# Patient Record
Sex: Male | Born: 1940 | Race: White | Hispanic: No | Smoking: Former smoker
Health system: Southern US, Community
[De-identification: ages and names within clinical notes are randomized; demographics above are authoritative.]

## PROBLEM LIST (undated history)

## (undated) DIAGNOSIS — M47812 Spondylosis without myelopathy or radiculopathy, cervical region: Secondary | ICD-10-CM

## (undated) DIAGNOSIS — C801 Malignant (primary) neoplasm, unspecified: Secondary | ICD-10-CM

## (undated) DIAGNOSIS — Z7901 Long term (current) use of anticoagulants: Secondary | ICD-10-CM

## (undated) DIAGNOSIS — E559 Vitamin D deficiency, unspecified: Secondary | ICD-10-CM

## (undated) DIAGNOSIS — E785 Hyperlipidemia, unspecified: Secondary | ICD-10-CM

## (undated) DIAGNOSIS — I639 Cerebral infarction, unspecified: Secondary | ICD-10-CM

## (undated) DIAGNOSIS — M1990 Unspecified osteoarthritis, unspecified site: Secondary | ICD-10-CM

## (undated) DIAGNOSIS — I10 Essential (primary) hypertension: Secondary | ICD-10-CM

## (undated) DIAGNOSIS — D500 Iron deficiency anemia secondary to blood loss (chronic): Secondary | ICD-10-CM

## (undated) HISTORY — DX: Essential (primary) hypertension: I10

## (undated) HISTORY — DX: Unspecified osteoarthritis, unspecified site: M19.90

## (undated) HISTORY — PX: COLONOSCOPY W/ POLYPECTOMY: SHX1380

## (undated) HISTORY — DX: Malignant (primary) neoplasm, unspecified: C80.1

## (undated) HISTORY — PX: TONSILLECTOMY AND ADENOIDECTOMY: SUR1326

## (undated) HISTORY — DX: Vitamin D deficiency, unspecified: E55.9

## (undated) HISTORY — DX: Long term (current) use of anticoagulants: Z79.01

## (undated) HISTORY — PX: ABCESS DRAINAGE: SHX399

## (undated) HISTORY — DX: Hyperlipidemia, unspecified: E78.5

## (undated) HISTORY — DX: Iron deficiency anemia secondary to blood loss (chronic): D50.0

---

## 1999-10-12 HISTORY — PX: CARDIOVASCULAR STRESS TEST: SHX262

## 1999-10-13 ENCOUNTER — Encounter: Admission: RE | Admit: 1999-10-13 | Discharge: 1999-10-13 | Payer: Self-pay | Admitting: Cardiology

## 1999-10-13 ENCOUNTER — Encounter: Payer: Self-pay | Admitting: Cardiology

## 2007-08-27 ENCOUNTER — Inpatient Hospital Stay (HOSPITAL_COMMUNITY): Admission: RE | Admit: 2007-08-27 | Discharge: 2007-08-29 | Payer: Self-pay | Admitting: Surgery

## 2009-10-13 ENCOUNTER — Encounter: Admission: RE | Admit: 2009-10-13 | Discharge: 2009-10-13 | Payer: Self-pay | Admitting: Cardiology

## 2010-06-16 ENCOUNTER — Ambulatory Visit: Payer: Self-pay | Admitting: Cardiology

## 2010-10-24 ENCOUNTER — Ambulatory Visit: Payer: Self-pay | Admitting: Cardiology

## 2011-02-28 NOTE — Discharge Summary (Signed)
NAME:  Jake Gross, Jake Gross             ACCOUNT NO.:  [id number]   MEDICAL RECORD NO.:  [id number]          PATIENT TYPE:  INP   LOCATION:  [id number]                         FACILITY:  [facility]   PHYSICIAN:  Earnstine Regal, MD      DATE OF BIRTH:  08-May-1941   DATE OF ADMISSION:  08/27/2007  DATE OF DISCHARGE:  08/29/2007                               DISCHARGE SUMMARY   REASON FOR ADMISSION:  Perirectal abscess.   HISTORY OF THE PRESENT ILLNESS:  The patient is a 70 year old white male  from [place], [place].  The patient presented to the office  with a 3-day history of perirectal pain.  The patient was seen and  evaluated by Dr. Fanny Skates and noted to have a perirectal abscess.  The patient was sent to [facility] where he was evaluated by  myself and prepared for the operating room.   HOSPITAL COURSE:  The patient was admitted and taken directly to the  operating room where he underwent examination under anesthesia with  incision, drainage and open packing of a very large left-sided  supralevator perirectal abscess.  Postoperatively he received  intravenous antibiotics for 48 hours.  He had local wound care.  The  patient required sliding scale insulin for control of his diabetes  mellitus.  The patient was prepared for discharge home on the second  postoperative day.   DISCHARGE/PLAN:  The patient will be discharged home today, August 29, 2007, in good condition, tolerating a modified carbohydrate diet and  ambulating independently.   DISCHARGE MEDICATIONS:  The discharge medications include Augmentin 875  mg twice a day for 7 days and Tylox as needed for pain.   WOUND CARE:  The patient's packing will be removed prior to discharge.  Local wound care will consist of tub soaks three times daily.  The  patient will also require a stool softener.   FOLLOW UP:  The patient will return to see me in 5 days for wound check  at [facility] surgery.   DISCHARGE DIAGNOSES:  1. Supralevator perirectal abscess.  2. Diabetes mellitus.   CONDITION AT DISCHARGE:  The patient's condition on discharge is  improved.      Earnstine Regal, MD  Electronically Signed     TMG/MEDQ  D:  08/29/2007  T:  08/30/2007  Job:  [id number]

## 2011-02-28 NOTE — Op Note (Signed)
NAME:  Jake Gross, Jake Gross             ACCOUNT NO.:  [id number]   MEDICAL RECORD NO.:  [id number]          PATIENT TYPE:  AMB   LOCATION:  DAY                          FACILITY:  [facility]   PHYSICIAN:  Earnstine Regal, MD      DATE OF BIRTH:  07/21/1941   DATE OF PROCEDURE:  08/27/2007  DATE OF DISCHARGE:                               OPERATIVE REPORT   PREOPERATIVE DIAGNOSIS:  Perirectal abscess.   POSTOPERATIVE DIAGNOSIS:  Supralevator perirectal abscess.   PROCEDURE:  Incision and drainage and open packing of supralevator  perirectal abscess.   SURGEON:  Earnstine Regal, MD, FACS   ANESTHESIA:  General per Dr. Osvaldo Shipper.   ESTIMATED BLOOD LOSS:  Minimal.   PREPARATION:  Betadine.   COMPLICATIONS:  None.   INDICATIONS:  The patient is a 70 year old white male presented to  [facility] today with 3-day history of left-sided rectal  pain.  The patient was seen and evaluated by Dr. Fanny Skates and  diagnosed with perirectal abscess.  He is referred to the operating room  at [facility] for drainage.   BODY OF REPORT:  Procedure is done in OR #3 at the [facility].  The patient is brought to the operating room, placed in  supine position on the operating room table.  Following administration  of general anesthesia the patient is placed in lithotomy and prepped and  draped in usual strict aseptic fashion.  After ascertaining that an  adequate level of anesthesia had been achieved, an incision is made at  the medial aspect of the left buttock just outside the anoderm with the  electrocautery.  2 cm incision is created.  Dissection is carried  through subcutaneous tissues with electrocautery for hemostasis.  Using  blunt dissection, the abscess cavity is entered digitally.  Loculations  were broken up with blunt digital dissection.  The abscess tracks along  the left side of the rectum.  It extends supralevator into a very large  abscess  cavity.  Purulent fluid is evacuated.  Aerobic and anaerobic  cultures were submitted to the laboratory.  Cavity is irrigated  copiously using an aseptic syringe with saline.  This was evacuated.  Finally a 2-inch vaginal pack saturated with Betadine is placed into the  cavity.  Dressings and ABD pad are placed on the perineum.  The patient  is taken out lithotomy and awakened from anesthesia.  The patient is  brought to the recovery room in stable condition.  The patient tolerated  the procedure well.     Earnstine Regal, MD  Electronically Signed    TMG/MEDQ  D:  08/27/2007  T:  08/28/2007  Job:  [id number]

## 2011-02-28 NOTE — H&P (Signed)
NAME:  Jake Gross, Jake Gross             ACCOUNT NO.:  [id number]   MEDICAL RECORD NO.:  [id number]          PATIENT TYPE:  INP   LOCATION:  1538                         FACILITY:  [facility]   PHYSICIAN:  Edsel Petrin. Dalbert Batman, M.D.DATE OF BIRTH:  Aug 17, 1941   DATE OF ADMISSION:  08/27/2007  DATE OF DISCHARGE:                              HISTORY & PHYSICAL   CHIEF COMPLAINT:  Left-sided rectal pain.   HISTORY OF PRESENT ILLNESS:  This is a 70 year old white male who  reports a 3 day history of constipation and progressive left-sided  rectal pain.  Has not had any fever or chills.  He has not had any  drainage.  He has never had a rectal problem before of any kind.  He saw  Dr. Laurence Spates and was referred to [place].   PAST HISTORY:  1. He does not have any history of prior rectal problems.  2. He has hypertension.  3. He has diabetes mellitus on Lantus insulin.   CURRENT MEDICATIONS:  1. Aspirin 81 mg a day.  2. Viagra p.r.n.  3. Hyzaar 100/2.5 mg daily.  4. Toprol XL 50 mg daily.  5. Lantus insulin 60 units b.i.d.  6. Januvia 100 mg daily.   DRUG ALLERGIES:  NONE KNOWN.   SOCIAL HISTORY:  Denies alcohol or tobacco.  He is a retired Development worker, community  carrier.   FAMILY HISTORY:  Father deceased and had some type of metastatic GI  cancer.  Mother deceased, possibly had diabetes.  Two brothers living.   REVIEW OF SYSTEMS:  A 15-system review of systems performed and is  noncontributory except as described above.   PHYSICAL EXAMINATION:  GENERAL:  Pleasant, older middle-aged gentleman  in mild distress.  VITAL SIGNS:  Height 5 feet 9 inches, weight 217, blood pressure 122/78,  pulse 94, temperature 97.9.  HEENT:  Eyes:  Sclerae are clear.  Extraocular movements are intact.  NECK:  Supple, nontender.  No mass or jugular venous distention.  LUNGS:  Clear to auscultation.  No chest wall tenderness.  HEART:  Regular rate and rhythm.  No murmur.  Radial and femoral pulses  are palpable.  ABDOMEN:   Soft, nontender.  Liver and spleen are not  enlarged.  No mass.  No hernia.  GENITOURINARY:  Penis, scrotum, testes  are normal.  RECTAL:  Externally, I do not see any obvious mass, erythema or  cellulitis.  On deep palpation of the left posterior position, he might  have a little bit of fullness, but this is not very discrete nor is it  very tender.  Digital rectal exam, however, reveals significant swelling  in the left lateral side above the dentate line suggesting a moderately  large submucosal abscess.  EXTREMITIES:  Free range of motion.  No deformity.  NEUROLOGIC:  No gross motor or sensory deficits.   ASSESSMENT:  1. Perirectal abscess, left lateral, probably submucosal.  2. Diabetes mellitus.  3. Hypertension.   PLAN:  1. The patient will be transferred to [facility] for      overnight admission, examination under anesthesia and drainage of  his perirectal abscess.  2. I have discussed his care with Dr. Armandina Gemma who has agreed to      assume his care and will evaluate the patient in the hospital.  3. I have discussed the intended procedures and risks with the      patient.  All of his questions were answered and he is in full      agreement with this plan.      Edsel Petrin. Dalbert Batman, M.D.  Electronically Signed     HMI/MEDQ  D:  08/27/2007  T:  08/28/2007  Job:  [id number]   cc:   Darlin Coco, M.D.  Fax: [place] Rolla Flatten., M.D.  Fax: [phone]

## 2011-03-06 ENCOUNTER — Other Ambulatory Visit: Payer: Self-pay | Admitting: Cardiology

## 2011-03-07 NOTE — Telephone Encounter (Signed)
escribe medication per fax request  

## 2011-07-25 LAB — HEMOGLOBIN A1C: Mean Plasma Glucose: 243

## 2011-07-25 LAB — CBC
HCT: 35.6 — ABNORMAL LOW
HCT: 39.1
Hemoglobin: 13.6
MCHC: 34.7
MCV: 83.7
MCV: 84
Platelets: 353
RBC: 4.24
RBC: 4.67
RDW: 13.2
WBC: 14.7 — ABNORMAL HIGH
WBC: 17.2 — ABNORMAL HIGH

## 2011-07-25 LAB — URINALYSIS, ROUTINE W REFLEX MICROSCOPIC
Bilirubin Urine: NEGATIVE
Glucose, UA: NEGATIVE
Hgb urine dipstick: NEGATIVE
Ketones, ur: NEGATIVE
Nitrite: NEGATIVE
Protein, ur: NEGATIVE
Specific Gravity, Urine: 1.018
Urobilinogen, UA: 1
pH: 5

## 2011-07-25 LAB — URINE MICROSCOPIC-ADD ON

## 2011-07-25 LAB — COMPREHENSIVE METABOLIC PANEL
BUN: 27 — ABNORMAL HIGH
Calcium: 8.9
Creatinine, Ser: 1.6 — ABNORMAL HIGH
Glucose, Bld: 197 — ABNORMAL HIGH
Sodium: 127 — ABNORMAL LOW
Total Protein: 7.2

## 2011-07-25 LAB — CULTURE, ROUTINE-ABSCESS: Culture: NORMAL

## 2011-07-25 LAB — BASIC METABOLIC PANEL
Chloride: 95 — ABNORMAL LOW
Creatinine, Ser: 1.28
GFR calc Af Amer: >60
Potassium: 4.3

## 2011-08-14 ENCOUNTER — Encounter: Payer: Self-pay | Admitting: Cardiology

## 2011-08-15 ENCOUNTER — Encounter: Payer: Self-pay | Admitting: Cardiology

## 2011-08-18 ENCOUNTER — Other Ambulatory Visit: Payer: Self-pay | Admitting: *Deleted

## 2011-08-18 ENCOUNTER — Encounter: Payer: Self-pay | Admitting: Cardiology

## 2011-08-18 ENCOUNTER — Ambulatory Visit (INDEPENDENT_AMBULATORY_CARE_PROVIDER_SITE_OTHER): Payer: Medicare Other | Admitting: Cardiology

## 2011-08-18 VITALS — BP 110/70 | HR 80 | Ht 67.0 in | Wt 223.0 lb

## 2011-08-18 DIAGNOSIS — E119 Type 2 diabetes mellitus without complications: Secondary | ICD-10-CM | POA: Insufficient documentation

## 2011-08-18 DIAGNOSIS — I119 Hypertensive heart disease without heart failure: Secondary | ICD-10-CM | POA: Insufficient documentation

## 2011-08-18 DIAGNOSIS — Z794 Long term (current) use of insulin: Secondary | ICD-10-CM | POA: Insufficient documentation

## 2011-08-18 DIAGNOSIS — M169 Osteoarthritis of hip, unspecified: Secondary | ICD-10-CM

## 2011-08-18 LAB — BASIC METABOLIC PANEL
CO2: 24 mEq/L (ref 19–32)
Chloride: 104 mEq/L (ref 96–112)
Creatinine, Ser: 1.2 mg/dL (ref 0.4–1.5)
Potassium: 4.1 mEq/L (ref 3.5–5.1)

## 2011-08-18 LAB — HEPATIC FUNCTION PANEL
Albumin: 3.9 g/dL (ref 3.5–5.2)
Alkaline Phosphatase: 69 U/L (ref 39–117)

## 2011-08-18 LAB — LIPID PANEL
Cholesterol: 156 mg/dL (ref 0–200)
HDL: 33.9 mg/dL — ABNORMAL LOW (ref >39.00)
Total CHOL/HDL Ratio: 5
Triglycerides: 249 mg/dL — ABNORMAL HIGH (ref 0.0–149.0)
VLDL: 49.8 mg/dL — ABNORMAL HIGH (ref 0.0–40.0)

## 2011-08-18 MED ORDER — METOPROLOL SUCCINATE ER 50 MG PO TB24: 50.0000 mg | ORAL_TABLET | Freq: Every day | ORAL | Status: DC

## 2011-08-18 NOTE — Progress Notes (Signed)
Clint Date of Birth:  1941/05/04 [facility] / [facility] [id number]   [place], [place]  [address] [phone]           Fax   [phone]  History of Present Illness: This pleasant 70 year old gentleman is seen for a scheduled four-month followup office visit.  He has a history of essential hypertension, and a history of insulin-dependent diabetes mellitus.  The patient does not have any history of known ischemic heart disease.  He does have a family history of coronary disease however, and the patient had a normal Cardiolite stress test in December 2000.  Current Outpatient Prescriptions  Medication Sig Dispense Refill  . acetaminophen (TYLENOL) 500 MG tablet Take 500 mg by mouth as directed. 1 to 2 every 6 hours as needed for pain       . aspirin 81 MG tablet Take 81 mg by mouth daily.        . B-D ULTRAFINE III SHORT PEN 31G X 8 MM MISC       . insulin glargine (LANTUS) 100 UNIT/ML injection Inject into the skin at bedtime. 75 units bid      . losartan-hydrochlorothiazide (HYZAAR) 100-25 MG per tablet TAKE 1 TABLET DAILY  90 tablet  3  . ONE TOUCH ULTRA TEST test strip       . metoprolol (TOPROL-XL) 50 MG 24 hr tablet Take 1 tablet (50 mg total) by mouth daily.  90 tablet  3    Allergies  Allergen Reactions  . Amaryl   . Avandia (Rosiglitazone Maleate)   . Glucophage (Metformin Hydrochloride)     There is no problem list on file for this patient.   History  Smoking status  . Former Smoker  . Quit date: 08/13/1994  Smokeless tobacco  . Not on file    History  Alcohol Use No    Family History  Problem Relation Age of Onset  . Cancer Father   . Coronary artery disease Brother   . Diabetes Brother   . Stroke Mother   . Diabetes Mother     Review of Systems: Constitutional: no fever chills diaphoresis or fatigue or change in weight.  Head and neck: no hearing loss, no epistaxis, no photophobia or visual  disturbance. Respiratory: No cough, shortness of breath or wheezing. Cardiovascular: No chest pain peripheral edema, palpitations. Gastrointestinal: No abdominal distention, no abdominal pain, no change in bowel habits hematochezia or melena. Genitourinary: No dysuria, no frequency, no urgency, no nocturia. Musculoskeletal:No arthralgias, no back pain, no gait disturbance or myalgias. Neurological: No dizziness, no headaches, no numbness, no seizures, no syncope, no weakness, no tremors. Hematologic: No lymphadenopathy, no easy bruising. Psychiatric: No confusion, no hallucinations, no sleep disturbance.    Physical Exam: Filed Vitals:   08/18/11 0901  BP: 110/70  Pulse: 80   Gen. appearance reveals a well-developed, well-nourished, gentleman in no distress.The head and neck exam reveals pupils equal and reactive.  Extraocular movements are full.  There is no scleral icterus.  The mouth and pharynx are normal.  The neck is supple.  The carotids reveal no bruits.  The jugular venous pressure is normal.  The  thyroid is not enlarged.  There is no lymphadenopathy.  The chest is clear to percussion and auscultation.  There are no rales or rhonchi.  Expansion of the chest is symmetrical.  The precordium is quiet.  The first heart sound is normal.  The second heart sound is physiologically split.  There is no murmur gallop rub or click.  There is no abnormal lift or heave.  The abdomen is soft and nontender.  The bowel sounds are normal.  The liver and spleen are not enlarged.  There are no abdominal masses.  There are no abdominal bruits.  Extremities reveal good pedal pulses.  There is no phlebitis or edema.  There is no cyanosis or clubbing.  Strength is normal and symmetrical in all extremities.  There is no lateralizing weakness.  There are no sensory deficits.  The skin is warm and dry.  There is no rash.    Assessment / Plan:  Recheck in 4 months for followup office visit the potential  hepatic function panel.  basal metabolic panel, and hemoglobin A1c

## 2011-08-18 NOTE — Assessment & Plan Note (Signed)
The patient has been having a lot of pain in his hips, particularly the left hip.  He has not seen an orthopedist.  He has not been taking any anti-inflammatory agents.  I suggested that he try taking Tylenol 500 mg one or 2 every 6 hours when necessary for anti-inflammatory and see if he gets some relief

## 2011-08-18 NOTE — Assessment & Plan Note (Signed)
The patient has a history of high blood pressure.  He has not been experiencing any chest pain or shortness of breath.  Is not expressing any symptoms of congestive heart failure.  His ejection fraction at the time of his nuclear stress test was 63%.

## 2011-08-18 NOTE — Telephone Encounter (Signed)
Refilled metoprolol 

## 2011-08-18 NOTE — Assessment & Plan Note (Signed)
The patient has a history of poorly controlled diabetes.  His recent hemoglobin A1c has been as high as 9.3.  He is followed at the chronic disease clinic at [facility].  He has not been expressing any hypoglycemic episodes.

## 2011-08-18 NOTE — Patient Instructions (Addendum)
Will obtain labs today and call you with results  Add Tylenol 500 mg 1-2 every 6 hours as needed for hip pain  Your physician recommends that you schedule a follow-up appointment in: 4 months

## 2011-08-22 ENCOUNTER — Telehealth: Payer: Self-pay | Admitting: *Deleted

## 2011-08-22 NOTE — Telephone Encounter (Signed)
Message copied by Earvin Hansen on Tue Aug 22, 2011 10:06 AM ------      Message from: Darlin Coco      Created: Sat Aug 19, 2011  1:54 PM       Please report.  Cholesterol and LDL are good.  The electrolytes are normal.  The blood sugar is improved at 140.  The liver function studies are elevated, consistent with fatty liver.  The triglycerides are still too high at 249.  He needs to work harder on weight loss and low carbohydrate diet and diabetic control.  Continue same dose.

## 2011-08-22 NOTE — Telephone Encounter (Signed)
Advised of labs 

## 2011-11-17 ENCOUNTER — Other Ambulatory Visit: Payer: Self-pay | Admitting: Cardiology

## 2011-11-17 NOTE — Telephone Encounter (Signed)
Refilled metoprolol 

## 2011-12-19 ENCOUNTER — Ambulatory Visit (INDEPENDENT_AMBULATORY_CARE_PROVIDER_SITE_OTHER): Payer: Medicare Other | Admitting: Cardiology

## 2011-12-19 ENCOUNTER — Encounter: Payer: Self-pay | Admitting: Cardiology

## 2011-12-19 ENCOUNTER — Other Ambulatory Visit (INDEPENDENT_AMBULATORY_CARE_PROVIDER_SITE_OTHER): Payer: Medicare Other

## 2011-12-19 VITALS — BP 150/84 | HR 77 | Resp 18 | Ht 67.0 in | Wt 223.0 lb

## 2011-12-19 DIAGNOSIS — R21 Rash and other nonspecific skin eruption: Secondary | ICD-10-CM | POA: Insufficient documentation

## 2011-12-19 DIAGNOSIS — E119 Type 2 diabetes mellitus without complications: Secondary | ICD-10-CM

## 2011-12-19 DIAGNOSIS — I119 Hypertensive heart disease without heart failure: Secondary | ICD-10-CM

## 2011-12-19 LAB — HEPATIC FUNCTION PANEL
ALT: 50 U/L (ref 0–53)
AST: 49 U/L — ABNORMAL HIGH (ref 0–37)
Alkaline Phosphatase: 67 U/L (ref 39–117)
Bilirubin, Direct: 0.1 mg/dL (ref 0.0–0.3)
Total Bilirubin: 0.9 mg/dL (ref 0.3–1.2)
Total Protein: 7.7 g/dL (ref 6.0–8.3)

## 2011-12-19 LAB — BASIC METABOLIC PANEL
CO2: 26 mEq/L (ref 19–32)
Chloride: 97 mEq/L (ref 96–112)
Creatinine, Ser: 1 mg/dL (ref 0.4–1.5)
Potassium: 3.9 mEq/L (ref 3.5–5.1)
Sodium: 132 mEq/L — ABNORMAL LOW (ref 135–145)

## 2011-12-19 LAB — LDL CHOLESTEROL, DIRECT: Direct LDL: 84.5 mg/dL

## 2011-12-19 LAB — LIPID PANEL: Total CHOL/HDL Ratio: 5

## 2011-12-19 LAB — HEMOGLOBIN A1C: Hgb A1c MFr Bld: 10.2 % — ABNORMAL HIGH (ref 4.6–6.5)

## 2011-12-19 NOTE — Assessment & Plan Note (Signed)
The patient has had a past history of excessive sun exposure.  He does have several dark skin lesions on his forearms.  He will seek dermatologic consultation concerning these.

## 2011-12-19 NOTE — Assessment & Plan Note (Signed)
Since last visit the patient has gone on but he calls a vegetarian diet.  He states that his blood sugars at home are improved since stopping eating fish and chicken.  He is eating just vegetables.

## 2011-12-19 NOTE — Progress Notes (Signed)
[place] Date of Birth:  1940-12-01 [facility] [address] [place], [place]  [address] [phone]         Fax   [phone]  History of Present Illness: This pleasant 71 year old gentleman is seen for a four-month followup office visit.  He has a history of essential hypertension and a history of diabetes.  He does not have any history of ischemic heart disease.  He had a normal nuclear stress test in December 2000.  She does have a family history of coronary disease.  Since last visit she's been doing fair.  He has not been getting any exercise.  His weight is unchanged since 4 months ago.  Current Outpatient Prescriptions  Medication Sig Dispense Refill  . acetaminophen (TYLENOL) 500 MG tablet Take 500 mg by mouth as directed. 1 to 2 every 6 hours as needed for pain       . aspirin 81 MG tablet Take 81 mg by mouth daily.        . B-D ULTRAFINE III SHORT PEN 31G X 8 MM MISC       . insulin glargine (LANTUS) 100 UNIT/ML injection Inject into the skin at bedtime. 75 units bid      . losartan-hydrochlorothiazide (HYZAAR) 100-25 MG per tablet TAKE 1 TABLET DAILY  90 tablet  3  . metoprolol succinate (TOPROL-XL) 50 MG 24 hr tablet TAKE 1 TABLET DAILY  90 tablet  3  . ONE TOUCH ULTRA TEST test strip         Allergies  Allergen Reactions  . Amaryl   . Avandia (Rosiglitazone Maleate)   . Glucophage (Metformin Hydrochloride)     Patient Active Problem List  Diagnoses  . Benign hypertensive heart disease without heart failure  . Diabetes mellitus without mention of complication  . Osteoarthritis of hip    History  Smoking status  . Former Smoker  . Quit date: 08/13/1994  Smokeless tobacco  . Not on file    History  Alcohol Use No    Family History  Problem Relation Age of Onset  . Cancer Father   . Coronary artery disease Brother   . Diabetes Brother   . Stroke Mother   . Diabetes Mother     Review of Systems: Constitutional: no  fever chills diaphoresis or fatigue or change in weight.  Head and neck: no hearing loss, no epistaxis, no photophobia or visual disturbance. Respiratory: No cough, shortness of breath or wheezing. Cardiovascular: No chest pain peripheral edema, palpitations. Gastrointestinal: No abdominal distention, no abdominal pain, no change in bowel habits hematochezia or melena. Genitourinary: No dysuria, no frequency, no urgency, no nocturia. Musculoskeletal:No arthralgias, no back pain, no gait disturbance or myalgias. Neurological: No dizziness, no headaches, no numbness, no seizures, no syncope, no weakness, no tremors. Hematologic: No lymphadenopathy, no easy bruising. Psychiatric: No confusion, no hallucinations, no sleep disturbance.    Physical Exam: Filed Vitals:   12/19/11 0854  BP: 150/84  Pulse: 77  Resp: 18   the general appearance reveals a well-developed well-nourished gentleman in no distress.The head and neck exam reveals pupils equal and reactive.  Extraocular movements are full.  There is no scleral icterus.  The mouth and pharynx are normal.  The neck is supple.  The carotids reveal no bruits.  The jugular venous pressure is normal.  The  thyroid is not enlarged.  There is no lymphadenopathy.  The chest is clear to percussion and auscultation.  There are no rales  or rhonchi.  Expansion of the chest is symmetrical.  The precordium is quiet.  The first heart sound is normal.  The second heart sound is physiologically split.  There is no murmur gallop rub or click.  There is no abnormal lift or heave.  The abdomen is soft and nontender.  The bowel sounds are normal.  The liver and spleen are not enlarged.  There are no abdominal masses.  There are no abdominal bruits.  Extremities reveal good pedal pulses.  There is no phlebitis or edema.  There is no cyanosis or clubbing.  Strength is normal and symmetrical in all extremities.  There is no lateralizing weakness.  There are no sensory  deficits.  The skin is warm and dry.  There is no rash.  There are several dark lesions on his forearms for which she will seek dermatologic evaluation   The EKG today shows normal sinus rhythm and is within normal limits.  Assessment / Plan: The patient is to continue same medication.  The to lose weight and restart exercise program.  We're checking lab work today including an A1c.  Recheck in 4 months for office visit and lab work

## 2011-12-19 NOTE — Patient Instructions (Signed)
Will obtain labs today and call you with the results (LP/HFP/BMET/A1C) Your physician recommends that you continue on your current medications as directed. Please refer to the Current Medication list given to you today. Your physician wants you to follow-up in: 4 months You will receive a reminder letter in the mail two months in advance. If you don't receive a letter, please call our office to schedule the follow-up appointment.

## 2011-12-19 NOTE — Assessment & Plan Note (Signed)
The patient has not been experiencing any exertional chest pain or dyspnea.  He has not had any headaches or dizziness

## 2011-12-20 ENCOUNTER — Telehealth: Payer: Self-pay | Admitting: *Deleted

## 2011-12-20 NOTE — Telephone Encounter (Signed)
Advised of labs and will forward to [facility]

## 2011-12-20 NOTE — Telephone Encounter (Signed)
Message copied by Earvin Hansen on Wed Dec 20, 2011  9:20 AM ------      Message from: Darlin Coco      Created: Tue Dec 19, 2011  8:02 PM       Please report.  The BS is better 126 but the A1C is very high 10.2.  TG are higher.      Kidney function is normal.      The LFTs are better.      Work harder on weight loss and increase aerobic exercise.      Send a copy of labs to [facility] at [facility].

## 2012-02-09 ENCOUNTER — Other Ambulatory Visit: Payer: Self-pay | Admitting: Cardiology

## 2012-04-24 ENCOUNTER — Ambulatory Visit: Payer: Medicare Other | Admitting: Cardiology

## 2012-04-24 ENCOUNTER — Other Ambulatory Visit: Payer: Medicare Other

## 2012-05-07 ENCOUNTER — Other Ambulatory Visit (INDEPENDENT_AMBULATORY_CARE_PROVIDER_SITE_OTHER): Payer: Medicare Other

## 2012-05-07 ENCOUNTER — Ambulatory Visit (INDEPENDENT_AMBULATORY_CARE_PROVIDER_SITE_OTHER): Payer: Medicare Other | Admitting: Cardiology

## 2012-05-07 ENCOUNTER — Encounter: Payer: Self-pay | Admitting: Cardiology

## 2012-05-07 VITALS — BP 140/82 | HR 60 | Ht 67.0 in | Wt 224.0 lb

## 2012-05-07 DIAGNOSIS — E785 Hyperlipidemia, unspecified: Secondary | ICD-10-CM

## 2012-05-07 DIAGNOSIS — I119 Hypertensive heart disease without heart failure: Secondary | ICD-10-CM

## 2012-05-07 DIAGNOSIS — R0989 Other specified symptoms and signs involving the circulatory and respiratory systems: Secondary | ICD-10-CM

## 2012-05-07 LAB — BASIC METABOLIC PANEL
CO2: 28 mEq/L (ref 19–32)
Chloride: 105 mEq/L (ref 96–112)
Potassium: 4.4 mEq/L (ref 3.5–5.1)
Sodium: 140 mEq/L (ref 135–145)

## 2012-05-07 LAB — HEPATIC FUNCTION PANEL
ALT: 40 U/L (ref 0–53)
Alkaline Phosphatase: 69 U/L (ref 39–117)
Bilirubin, Direct: 0.2 mg/dL (ref 0.0–0.3)
Total Bilirubin: 1.4 mg/dL — ABNORMAL HIGH (ref 0.3–1.2)
Total Protein: 7.6 g/dL (ref 6.0–8.3)

## 2012-05-07 LAB — LDL CHOLESTEROL, DIRECT: Direct LDL: 62.4 mg/dL

## 2012-05-07 LAB — LIPID PANEL
Total CHOL/HDL Ratio: 4
Triglycerides: 296 mg/dL — ABNORMAL HIGH (ref 0.0–149.0)

## 2012-05-07 LAB — HEMOGLOBIN A1C: Hgb A1c MFr Bld: 9.8 % — ABNORMAL HIGH (ref 4.6–6.5)

## 2012-05-07 NOTE — Progress Notes (Signed)
Quick Note:  Please report to patient. The recent labs are stable. Continue same medication and careful diet.Copy of labs to Oretha Ellis PharmD ______

## 2012-05-07 NOTE — Patient Instructions (Addendum)
Your physician recommends that you return for lab work in: 4 months for fasting lipid,liver,bmp, HbA1C  Your physician recommends that you have lab work today: lipid,liver,bmp, HbA1C  Your physician recommends that you continue on your current medications as directed. Please refer to the Current Medication list given to you today.  Your physician recommends that you schedule a follow-up appointment in: 4 months with Dr. Mare Ferrari

## 2012-05-07 NOTE — Assessment & Plan Note (Signed)
The patient has not been experiencing any chest pain or shortness of breath.  No symptoms of congestive heart failure.  He has not been aware of any palpitations.  No dizziness or syncope.  He has not been getting any regular exercise because his bicycle has a flat tire

## 2012-05-07 NOTE — Assessment & Plan Note (Signed)
The patient checks his blood sugars once or twice a day.  He states that his blood sugars are usually in the 120 mg range.   he has not been having any hypoglycemic episodes

## 2012-05-07 NOTE — Progress Notes (Signed)
[place] Date of Birth:  1941/03/23 [facility] [address] [place], [place]  [address] [phone]  Fax   [phone]  HPI: This pleasant 71 year old gentleman is seen for a scheduled followup office visit.  He has a prior history of insulin-dependent diabetes mellitus and a history of hypertension.  He does not have any history of ischemic heart disease.  He had a normal nuclear stress test in December 2000.  His diabetes is followed at the overt medical by [facility]  Current Outpatient Prescriptions  Medication Sig Dispense Refill  . acetaminophen (TYLENOL) 500 MG tablet Take 500 mg by mouth as directed. 1 to 2 every 6 hours as needed for pain       . aspirin 81 MG tablet Take 81 mg by mouth daily.        . insulin glargine (LANTUS) 100 UNIT/ML injection Inject into the skin at bedtime. 75 units bid      . losartan-hydrochlorothiazide (HYZAAR) 100-25 MG per tablet TAKE 1 TABLET DAILY  90 tablet  2  . metoprolol succinate (TOPROL-XL) 50 MG 24 hr tablet TAKE 1 TABLET DAILY  90 tablet  3  . ONE TOUCH ULTRA TEST test strip       . B-D ULTRAFINE III SHORT PEN 31G X 8 MM MISC         Allergies  Allergen Reactions  . Amaryl   . Avandia (Rosiglitazone Maleate)   . Glucophage (Metformin Hydrochloride)     Patient Active Problem List  Diagnosis  . Benign hypertensive heart disease without heart failure  . Diabetes mellitus without mention of complication  . Osteoarthritis of hip  . Skin rash    History  Smoking status  . Former Smoker  . Quit date: 08/13/1994  Smokeless tobacco  . Not on file    History  Alcohol Use No    Family History  Problem Relation Age of Onset  . Cancer Father   . Coronary artery disease Brother   . Diabetes Brother   . Stroke Mother   . Diabetes Mother     Review of Systems: The patient denies any heat or cold intolerance.  No weight gain or weight loss.  The patient denies headaches or blurry vision.   There is no cough or sputum production.  The patient denies dizziness.  There is no hematuria or hematochezia.  The patient denies any muscle aches or arthritis.  The patient denies any rash.  The patient denies frequent falling or instability.  There is no history of depression or anxiety.  All other systems were reviewed and are negative.   Physical Exam: Filed Vitals:   05/07/12 1101  BP: 140/82  Pulse: 60   the general appearance reveals a well-developed well-nourished gentleman in no distress.  Weight is up 1 pound since last visit.The head and neck exam reveals pupils equal and reactive.  Extraocular movements are full.  There is no scleral icterus.  The mouth and pharynx are normal.  The neck is supple.  The carotids reveal no bruits.  The jugular venous pressure is normal.  The  thyroid is not enlarged.  There is no lymphadenopathy.  The chest is clear to percussion and auscultation.  There are no rales or rhonchi.  Expansion of the chest is symmetrical.  The precordium is quiet.  The first heart sound is normal.  The second heart sound is physiologically split.  There is no murmur gallop rub or click.  There is  no abnormal lift or heave.  The abdomen is soft and nontender.  The bowel sounds are normal.  The liver and spleen are not enlarged.  There are no abdominal masses.  There are no abdominal bruits.  Extremities reveal good pedal pulses.  There is no phlebitis or edema.  There is no cyanosis or clubbing.  Strength is normal and symmetrical in all extremities.  There is no lateralizing weakness.  There are no sensory deficits.  The skin is warm and dry.  There is no rash.      Assessment / Plan: The patient is to continue same medication.  We are checking blood work today.  We will send a copy to [place].  I have encouraged him to work harder on weight loss and increased aerobic exercise.  We will check him again in 4 months

## 2012-05-08 ENCOUNTER — Telehealth: Payer: Self-pay | Admitting: *Deleted

## 2012-05-08 NOTE — Telephone Encounter (Signed)
Message copied by Earvin Hansen on Wed May 08, 2012  9:29 AM ------      Message from: Darlin Coco      Created: Tue May 07, 2012  4:29 PM       Please report to patient.  The recent labs are stable. Continue same medication and careful diet.Copy of labs to Oretha Ellis PharmD

## 2012-05-08 NOTE — Telephone Encounter (Signed)
Mailed copy of labs and left message to call if any questions  

## 2012-09-16 ENCOUNTER — Encounter: Payer: Self-pay | Admitting: Cardiology

## 2012-09-16 ENCOUNTER — Ambulatory Visit (INDEPENDENT_AMBULATORY_CARE_PROVIDER_SITE_OTHER): Payer: Medicare Other | Admitting: Cardiology

## 2012-09-16 VITALS — BP 149/86 | HR 67 | Resp 18 | Ht 67.0 in | Wt 226.4 lb

## 2012-09-16 DIAGNOSIS — E119 Type 2 diabetes mellitus without complications: Secondary | ICD-10-CM

## 2012-09-16 DIAGNOSIS — M129 Arthropathy, unspecified: Secondary | ICD-10-CM

## 2012-09-16 DIAGNOSIS — I119 Hypertensive heart disease without heart failure: Secondary | ICD-10-CM

## 2012-09-16 DIAGNOSIS — IMO0001 Reserved for inherently not codable concepts without codable children: Secondary | ICD-10-CM

## 2012-09-16 DIAGNOSIS — M1990 Unspecified osteoarthritis, unspecified site: Secondary | ICD-10-CM

## 2012-09-16 LAB — LDL CHOLESTEROL, DIRECT: Direct LDL: 77.2 mg/dL

## 2012-09-16 LAB — HEPATIC FUNCTION PANEL
ALT: 85 U/L — ABNORMAL HIGH (ref 0–53)
AST: 90 U/L — ABNORMAL HIGH (ref 0–37)
Bilirubin, Direct: 0.3 mg/dL (ref 0.0–0.3)
Total Bilirubin: 1.6 mg/dL — ABNORMAL HIGH (ref 0.3–1.2)
Total Protein: 7.2 g/dL (ref 6.0–8.3)

## 2012-09-16 LAB — BASIC METABOLIC PANEL
BUN: 19 mg/dL (ref 6–23)
Calcium: 9.1 mg/dL (ref 8.4–10.5)
GFR: 89.89 mL/min (ref >60.00)
Glucose, Bld: 158 mg/dL — ABNORMAL HIGH (ref 70–99)
Potassium: 3.7 mEq/L (ref 3.5–5.1)

## 2012-09-16 LAB — HEMOGLOBIN A1C: Hgb A1c MFr Bld: 10.9 % — ABNORMAL HIGH (ref 4.6–6.5)

## 2012-09-16 LAB — LIPID PANEL
HDL: 28.3 mg/dL — ABNORMAL LOW (ref >39.00)
Triglycerides: 315 mg/dL — ABNORMAL HIGH (ref 0.0–149.0)

## 2012-09-16 NOTE — Assessment & Plan Note (Signed)
The patient's symptoms of his hands being swollen and painful and difficult to flex his fingers has come on gradually over the past several months.  Both hands are affected equally.  He has not had any prior history of known arthritis.  We will plan to refer him to rheumatologist Dr. Berna Bue.

## 2012-09-16 NOTE — Progress Notes (Signed)
[place] Date of Birth:  [birth year] [facility] [address] [place], [place]  [address] [phone]         Fax   [phone]  History of Present Illness: This pleasant 71 year old gentleman is seen for a scheduled followup office visit. He has a prior history of insulin-dependent diabetes mellitus and a history of hypertension. He does not have any history of ischemic heart disease. He had a normal nuclear stress test in December 2000. His diabetes is followed at the [facility] by [facility].  Since last visit the patient has developed painful swollen hands bilaterally.  He has difficulty bending his fingers.  His hands bother him worse at night.  He does not have any pain or numbness of his feet.   Current Outpatient Prescriptions  Medication Sig Dispense Refill  . acetaminophen (TYLENOL) 500 MG tablet Take 500 mg by mouth as directed. 1 to 2 every 6 hours as needed for pain       . aspirin 81 MG tablet Take 81 mg by mouth daily.        . B-D ULTRAFINE III SHORT PEN 31G X 8 MM MISC       . insulin glargine (LANTUS) 100 UNIT/ML injection Inject into the skin at bedtime. 75 units bid      . losartan-hydrochlorothiazide (HYZAAR) 100-25 MG per tablet TAKE 1 TABLET DAILY  90 tablet  2  . metoprolol succinate (TOPROL-XL) 50 MG 24 hr tablet TAKE 1 TABLET DAILY  90 tablet  3  . ONE TOUCH ULTRA TEST test strip         Allergies  Allergen Reactions  . Amaryl   . Avandia (Rosiglitazone Maleate)   . Glucophage (Metformin Hydrochloride)     Patient Active Problem List  Diagnosis  . Benign hypertensive heart disease without heart failure  . Diabetes mellitus without mention of complication  . Osteoarthritis of hip  . Skin rash  . Arthritis    History  Smoking status  . Former Smoker  . Quit date: 08/13/1994  Smokeless tobacco  . Not on file    History  Alcohol Use No    Family History  Problem Relation Age of Onset  . Cancer Father     . Coronary artery disease Brother   . Diabetes Brother   . Stroke Mother   . Diabetes Mother     Review of Systems: Constitutional: no fever chills diaphoresis or fatigue or change in weight.  Head and neck: no hearing loss, no epistaxis, no photophobia or visual disturbance. Respiratory: No cough, shortness of breath or wheezing. Cardiovascular: No chest pain peripheral edema, palpitations. Gastrointestinal: No abdominal distention, no abdominal pain, no change in bowel habits hematochezia or melena. Genitourinary: No dysuria, no frequency, no urgency, no nocturia. Musculoskeletal:No arthralgias, no back pain, no gait disturbance or myalgias. Neurological: No dizziness, no headaches, no numbness, no seizures, no syncope, no weakness, no tremors. Hematologic: No lymphadenopathy, no easy bruising. Psychiatric: No confusion, no hallucinations, no sleep disturbance.    Physical Exam: Filed Vitals:   09/16/12 0920  BP: 149/86  Pulse: 67  Resp: 18   general appearance reveals a well-developed well-nourished gentleman in no distress.The head and neck exam reveals pupils equal and reactive.  Extraocular movements are full.  There is no scleral icterus.  The mouth and pharynx are normal.  The neck is supple.  The carotids reveal no bruits.  The jugular venous pressure is normal.  The  thyroid  is not enlarged.  There is no lymphadenopathy.  The chest is clear to percussion and auscultation.  There are no rales or rhonchi.  Expansion of the chest is symmetrical.  The precordium is quiet.  The first heart sound is normal.  The second heart sound is physiologically split.  There is no murmur gallop rub or click.  There is no abnormal lift or heave.  The abdomen is soft and nontender.  The bowel sounds are normal.  The liver and spleen are not enlarged.  There are no abdominal masses.  There are no abdominal bruits.  Extremities reveal good pedal pulses.  His hands show evidence of bilateral  thickening and swelling and he has difficulty flexing his fingers because of the swelling.  Tinel's sign is negative.  There is no phlebitis or edema.  There is no cyanosis or clubbing.  Strength is normal and symmetrical in all extremities.  There is no lateralizing weakness.  There are no sensory deficits.  The skin is warm and dry.  There is no rash.     Assessment / Plan: Continue same medication.  Referral to Dr. Lenna Gilford for rheumatology consult. Recheck in 4 months for followup office visit lipid panel hepatic function panel basal metabolic panel and [id number]

## 2012-09-16 NOTE — Assessment & Plan Note (Signed)
Blood pressure was remaining stable on current medication.  The patient is not having any headaches or dizzy spells.  He is not having any symptoms of congestive heart failure.

## 2012-09-16 NOTE — Patient Instructions (Addendum)
Your physician recommends that you continue on your current medications as directed. Please refer to the Current Medication list given to you today.  Your physician wants you to follow-up in: 4 months with fasting labs (lp/bmet/hfp/a1c) You will receive a reminder letter in the mail two months in advance. If you don't receive a letter, please call our office to schedule the follow-up appointment.   Will obtain labs today and call you with the results (lp/bmet/hfp/a1c)  Have called to get you an appointment with Dr Berna Bue ([address] phone [phone]), will call you when hear back from there office.

## 2012-09-16 NOTE — Assessment & Plan Note (Signed)
The patient has not been experiencing any hypoglycemic episodes.  His weight is up 2 pounds since last visit.

## 2012-09-19 ENCOUNTER — Telehealth: Payer: Self-pay | Admitting: *Deleted

## 2012-09-19 NOTE — Telephone Encounter (Signed)
Message copied by Earvin Hansen on Thu Sep 19, 2012  8:26 AM ------      Message from: Darlin Coco      Created: Mon Sep 16, 2012  9:57 PM       BS and A1C are higher. TG higher.  Cholesterol okay. LFTs higher c/w fatty liver. Work harder on diabetic diet.      Send copy to [employer] at [facility].

## 2012-09-19 NOTE — Telephone Encounter (Signed)
Advised patient of lab results  

## 2012-10-28 ENCOUNTER — Other Ambulatory Visit: Payer: Self-pay | Admitting: *Deleted

## 2012-10-28 MED ORDER — LOSARTAN POTASSIUM-HCTZ 100-25 MG PO TABS: 1.0000 | ORAL_TABLET | Freq: Every day | ORAL | Status: DC

## 2013-01-27 ENCOUNTER — Other Ambulatory Visit: Payer: Self-pay | Admitting: *Deleted

## 2013-01-27 MED ORDER — METOPROLOL SUCCINATE ER 50 MG PO TB24: 50.0000 mg | ORAL_TABLET | Freq: Every day | ORAL | Status: DC

## 2013-06-28 ENCOUNTER — Other Ambulatory Visit: Payer: Self-pay | Admitting: Cardiology

## 2013-09-15 ENCOUNTER — Encounter: Payer: Self-pay | Admitting: Cardiology

## 2013-09-26 ENCOUNTER — Other Ambulatory Visit: Payer: Self-pay | Admitting: Cardiology

## 2013-11-17 DIAGNOSIS — M069 Rheumatoid arthritis, unspecified: Secondary | ICD-10-CM | POA: Diagnosis not present

## 2013-11-17 DIAGNOSIS — Z79899 Other long term (current) drug therapy: Secondary | ICD-10-CM | POA: Diagnosis not present

## 2013-11-17 DIAGNOSIS — M2550 Pain in unspecified joint: Secondary | ICD-10-CM | POA: Diagnosis not present

## 2013-11-17 DIAGNOSIS — R894 Abnormal immunological findings in specimens from other organs, systems and tissues: Secondary | ICD-10-CM | POA: Diagnosis not present

## 2013-11-26 DIAGNOSIS — R5381 Other malaise: Secondary | ICD-10-CM | POA: Diagnosis not present

## 2013-11-26 DIAGNOSIS — E559 Vitamin D deficiency, unspecified: Secondary | ICD-10-CM | POA: Diagnosis not present

## 2013-11-26 DIAGNOSIS — R5383 Other fatigue: Secondary | ICD-10-CM | POA: Diagnosis not present

## 2013-11-26 DIAGNOSIS — E7800 Pure hypercholesterolemia, unspecified: Secondary | ICD-10-CM | POA: Diagnosis not present

## 2013-11-26 DIAGNOSIS — I10 Essential (primary) hypertension: Secondary | ICD-10-CM | POA: Diagnosis not present

## 2013-11-26 DIAGNOSIS — IMO0001 Reserved for inherently not codable concepts without codable children: Secondary | ICD-10-CM | POA: Diagnosis not present

## 2013-11-26 DIAGNOSIS — E119 Type 2 diabetes mellitus without complications: Secondary | ICD-10-CM | POA: Diagnosis not present

## 2013-11-26 DIAGNOSIS — Z0189 Encounter for other specified special examinations: Secondary | ICD-10-CM | POA: Diagnosis not present

## 2013-12-24 ENCOUNTER — Other Ambulatory Visit: Payer: Self-pay | Admitting: Cardiology

## 2014-01-09 DIAGNOSIS — E119 Type 2 diabetes mellitus without complications: Secondary | ICD-10-CM | POA: Diagnosis not present

## 2014-01-09 DIAGNOSIS — H2510 Age-related nuclear cataract, unspecified eye: Secondary | ICD-10-CM | POA: Diagnosis not present

## 2014-02-16 DIAGNOSIS — M069 Rheumatoid arthritis, unspecified: Secondary | ICD-10-CM | POA: Diagnosis not present

## 2014-02-16 DIAGNOSIS — Z79899 Other long term (current) drug therapy: Secondary | ICD-10-CM | POA: Diagnosis not present

## 2014-02-16 DIAGNOSIS — M76899 Other specified enthesopathies of unspecified lower limb, excluding foot: Secondary | ICD-10-CM | POA: Diagnosis not present

## 2014-02-16 DIAGNOSIS — M2550 Pain in unspecified joint: Secondary | ICD-10-CM | POA: Diagnosis not present

## 2014-02-27 ENCOUNTER — Other Ambulatory Visit: Payer: Self-pay | Admitting: Cardiology

## 2014-03-02 NOTE — Telephone Encounter (Signed)
Patient needs appointment, has not been seen since 09/2012. We have been putting appointment warnings on the bottle for a while. Ok to refill? Please advise. Thanks, MI

## 2014-05-04 DIAGNOSIS — E161 Other hypoglycemia: Secondary | ICD-10-CM | POA: Diagnosis not present

## 2014-05-04 DIAGNOSIS — R159 Full incontinence of feces: Secondary | ICD-10-CM | POA: Diagnosis not present

## 2014-05-06 ENCOUNTER — Encounter: Payer: Self-pay | Admitting: Cardiology

## 2014-05-07 ENCOUNTER — Encounter: Payer: Self-pay | Admitting: Gastroenterology

## 2014-05-19 DIAGNOSIS — Z79899 Other long term (current) drug therapy: Secondary | ICD-10-CM | POA: Diagnosis not present

## 2014-05-19 DIAGNOSIS — M069 Rheumatoid arthritis, unspecified: Secondary | ICD-10-CM | POA: Diagnosis not present

## 2014-05-19 DIAGNOSIS — M76899 Other specified enthesopathies of unspecified lower limb, excluding foot: Secondary | ICD-10-CM | POA: Diagnosis not present

## 2014-05-19 DIAGNOSIS — M2550 Pain in unspecified joint: Secondary | ICD-10-CM | POA: Diagnosis not present

## 2014-07-16 ENCOUNTER — Other Ambulatory Visit (INDEPENDENT_AMBULATORY_CARE_PROVIDER_SITE_OTHER): Payer: Medicare Other

## 2014-07-16 ENCOUNTER — Ambulatory Visit (INDEPENDENT_AMBULATORY_CARE_PROVIDER_SITE_OTHER): Payer: Medicare Other | Admitting: Gastroenterology

## 2014-07-16 ENCOUNTER — Encounter: Payer: Self-pay | Admitting: Gastroenterology

## 2014-07-16 VITALS — BP 160/84 | HR 70 | Ht 67.0 in | Wt 225.6 lb

## 2014-07-16 DIAGNOSIS — R194 Change in bowel habit: Secondary | ICD-10-CM | POA: Diagnosis not present

## 2014-07-16 DIAGNOSIS — R197 Diarrhea, unspecified: Secondary | ICD-10-CM | POA: Diagnosis not present

## 2014-07-16 DIAGNOSIS — I119 Hypertensive heart disease without heart failure: Secondary | ICD-10-CM | POA: Diagnosis not present

## 2014-07-16 DIAGNOSIS — E0865 Diabetes mellitus due to underlying condition with hyperglycemia: Secondary | ICD-10-CM

## 2014-07-16 LAB — CBC WITH DIFFERENTIAL/PLATELET
BASOS ABS: 0 10*3/uL (ref 0.0–0.1)
Basophils Relative: 0.4 % (ref 0.0–3.0)
Eosinophils Absolute: 0.2 10*3/uL (ref 0.0–0.7)
Eosinophils Relative: 2.5 % (ref 0.0–5.0)
HEMATOCRIT: 43.2 % (ref 39.0–52.0)
HEMOGLOBIN: 14.3 g/dL (ref 13.0–17.0)
LYMPHS ABS: 2.1 10*3/uL (ref 0.7–4.0)
Lymphocytes Relative: 22.9 % (ref 12.0–46.0)
MCHC: 33.1 g/dL (ref 30.0–36.0)
MCV: 89.6 fl (ref 78.0–100.0)
MONOS PCT: 11.4 % (ref 3.0–12.0)
Monocytes Absolute: 1 10*3/uL (ref 0.1–1.0)
Neutro Abs: 5.8 10*3/uL (ref 1.4–7.7)
Neutrophils Relative %: 62.8 % (ref 43.0–77.0)
PLATELETS: 215 10*3/uL (ref 150.0–400.0)
RBC: 4.82 Mil/uL (ref 4.22–5.81)
RDW: 15.1 % (ref 11.5–15.5)
WBC: 9.2 10*3/uL (ref 4.0–10.5)

## 2014-07-16 LAB — COMPREHENSIVE METABOLIC PANEL
ALK PHOS: 71 U/L (ref 39–117)
ALT: 23 U/L (ref 0–53)
AST: 31 U/L (ref 0–37)
Albumin: 3.2 g/dL — ABNORMAL LOW (ref 3.5–5.2)
BUN: 22 mg/dL (ref 6–23)
CO2: 27 mEq/L (ref 19–32)
Calcium: 9.3 mg/dL (ref 8.4–10.5)
Chloride: 109 mEq/L (ref 96–112)
Creatinine, Ser: 1 mg/dL (ref 0.4–1.5)
GFR: 77.27 mL/min (ref >60.00)
Glucose, Bld: 196 mg/dL — ABNORMAL HIGH (ref 70–99)
Potassium: 4.3 mEq/L (ref 3.5–5.1)
SODIUM: 142 meq/L (ref 135–145)
TOTAL PROTEIN: 7.2 g/dL (ref 6.0–8.3)
Total Bilirubin: 0.7 mg/dL (ref 0.2–1.2)

## 2014-07-16 LAB — C-REACTIVE PROTEIN: CRP: 0.9 mg/dL (ref 0.5–20.0)

## 2014-07-16 MED ORDER — NA SULFATE-K SULFATE-MG SULF 17.5-3.13-1.6 GM/177ML PO SOLN: 1.0000 | Freq: Once | ORAL | Status: DC

## 2014-07-16 NOTE — Assessment & Plan Note (Signed)
Three-month history of change of bowel habits with severe urgency and stool incontinence.  A structural abnormality the colon should be ruled out.  She'll overgrowth related to diabetes a motility disorder is also a consideration.  Recommendations #1 CBC, comp he has a metabolic profile, CRP #2 colonoscopy

## 2014-07-16 NOTE — Progress Notes (Signed)
_                                                                                                                History of Present Illness:  [name] is a 73 year old white male with history of hypertension and diabetes referred for evaluation of diarrhea and incontinence.  Over the past 3 months he's developed a change of bowel habits.  He now has 4-5 watery stools during the day and night.  It is often postprandial and a copy by severe urgency and occasional incontinence.  He denies abdominal pain or rectal bleeding.  There has been no change in diet or medications.  He has not taken antibiotics.  He has lost about 10 pounds which he says is by [employer].  He denies nausea or bloating.   Past Medical History  Diagnosis Date  . Hypertension   . Diabetes mellitus   . Hyperlipidemia   . Arthritis   . Vitamin D deficiency    Past Surgical History  Procedure Laterality Date  . Cardiovascular stress test  10/12/1999    EF 63%. NO ISCHEMIA  . Tonsillectomy and adenoidectomy     family history includes Cancer in his father; Coronary artery disease in his brother; Diabetes in his brother and mother; Stroke in his mother. Current Outpatient Prescriptions  Medication Sig Dispense Refill  . aspirin 81 MG tablet Take 162 mg by mouth daily.       . B-D ULTRAFINE III SHORT PEN 31G X 8 MM MISC       . folic acid (FOLVITE) 1 MG tablet Take 1 mg by mouth daily.      . insulin glargine (LANTUS) 100 UNIT/ML injection Inject into the skin at bedtime. 75 units bid      . insulin lispro (HUMALOG KWIKPEN) 100 UNIT/ML KiwkPen Inject into the skin. 5 units before meals as needed      . losartan-hydrochlorothiazide (HYZAAR) 100-25 MG per tablet TAKE 1 TABLET DAILY (PATIENT NEEDS TO CALL OFFICE TO SCHEDULE FOLLOW UP APPOINTMENT)  90 tablet  0  . metoprolol succinate (TOPROL-XL) 50 MG 24 hr tablet Take 1 tablet (50 mg total) by mouth daily. Take with or immediately following a meal.  90 tablet   3  . ONE TOUCH ULTRA TEST test strip       . rosuvastatin (CRESTOR) 20 MG tablet Take 20 mg by mouth daily.       No current facility-administered medications for this visit.   Allergies as of 07/16/2014 - Review Complete 07/16/2014  Allergen Reaction Noted  . Amaryl  08/14/2011  . Avandia [rosiglitazone maleate]  08/15/2011  . Glucophage [metformin hydrochloride]  08/14/2011    reports that he quit smoking about 19 years ago. He has never used smokeless tobacco. He reports that he does not drink alcohol or use illicit drugs.   Review of Systems: Pertinent positive and negative review of systems were noted in the above HPI section. All other review of systems were otherwise negative.  Vital signs were reviewed in today's medical record Physical Exam: General: Well developed , well nourished, no acute distress Skin: anicteric Head: Normocephalic and atraumatic Eyes:  sclerae anicteric, EOMI Ears: Normal auditory acuity Mouth: No deformity or lesions Neck: Supple, no masses or thyromegaly Lungs: Clear throughout to auscultation Heart: Regular rate and rhythm; no murmurs, rubs or bruits Abdomen: Soft, non tender and non distended. No masses, hepatosplenomegaly or hernias noted. Normal Bowel sounds.  There is no succussion splash Rectal:deferred Musculoskeletal: Symmetrical with no gross deformities  Skin: No lesions on visible extremities Pulses:  Normal pulses noted Extremities: No clubbing, cyanosis, edema or deformities noted Neurological: Alert oriented x 4, grossly nonfocal Cervical Nodes:  No significant cervical adenopathy Inguinal Nodes: No significant inguinal adenopathy Psychological:  Alert and cooperative. Normal mood and affect  See Assessment and Plan under Problem List

## 2014-07-16 NOTE — Patient Instructions (Signed)
You have been scheduled for a colonoscopy. Please follow written instructions given to you at your visit today.  Please pick up your prep kit at the pharmacy within the next 1-3 days. If you use inhalers (even only as needed), please bring them with you on the day of your procedure. Your physician has requested that you go to www.startemmi.com and enter the access code given to you at your visit today. This web site gives a general overview about your procedure. However, you should still follow specific instructions given to you by our office regarding your preparation for the procedure.  Go to the basement for labs today Use Imodium every 6 hours as needed for diarrhea

## 2014-08-19 DIAGNOSIS — Z79899 Other long term (current) drug therapy: Secondary | ICD-10-CM | POA: Diagnosis not present

## 2014-08-19 DIAGNOSIS — M2550 Pain in unspecified joint: Secondary | ICD-10-CM | POA: Diagnosis not present

## 2014-08-19 DIAGNOSIS — G5602 Carpal tunnel syndrome, left upper limb: Secondary | ICD-10-CM | POA: Diagnosis not present

## 2014-08-19 DIAGNOSIS — M79643 Pain in unspecified hand: Secondary | ICD-10-CM | POA: Diagnosis not present

## 2014-08-19 DIAGNOSIS — M0589 Other rheumatoid arthritis with rheumatoid factor of multiple sites: Secondary | ICD-10-CM | POA: Diagnosis not present

## 2014-09-17 ENCOUNTER — Encounter: Payer: Self-pay | Admitting: Gastroenterology

## 2014-09-17 ENCOUNTER — Ambulatory Visit (AMBULATORY_SURGERY_CENTER): Payer: Medicare Other | Admitting: Gastroenterology

## 2014-09-17 VITALS — BP 157/77 | HR 65 | Temp 96.9°F | Resp 14 | Ht 67.0 in | Wt 225.0 lb

## 2014-09-17 DIAGNOSIS — D127 Benign neoplasm of rectosigmoid junction: Secondary | ICD-10-CM | POA: Diagnosis not present

## 2014-09-17 DIAGNOSIS — D123 Benign neoplasm of transverse colon: Secondary | ICD-10-CM

## 2014-09-17 DIAGNOSIS — K635 Polyp of colon: Secondary | ICD-10-CM | POA: Diagnosis not present

## 2014-09-17 DIAGNOSIS — R197 Diarrhea, unspecified: Secondary | ICD-10-CM | POA: Diagnosis not present

## 2014-09-17 DIAGNOSIS — D128 Benign neoplasm of rectum: Secondary | ICD-10-CM

## 2014-09-17 DIAGNOSIS — D120 Benign neoplasm of cecum: Secondary | ICD-10-CM

## 2014-09-17 DIAGNOSIS — K5731 Diverticulosis of large intestine without perforation or abscess with bleeding: Secondary | ICD-10-CM

## 2014-09-17 DIAGNOSIS — D124 Benign neoplasm of descending colon: Secondary | ICD-10-CM

## 2014-09-17 DIAGNOSIS — Z1211 Encounter for screening for malignant neoplasm of colon: Secondary | ICD-10-CM | POA: Diagnosis not present

## 2014-09-17 DIAGNOSIS — D122 Benign neoplasm of ascending colon: Secondary | ICD-10-CM

## 2014-09-17 DIAGNOSIS — I10 Essential (primary) hypertension: Secondary | ICD-10-CM | POA: Diagnosis not present

## 2014-09-17 MED ORDER — SODIUM CHLORIDE 0.9 % IV SOLN: 500.0000 mL | INTRAVENOUS | Status: DC

## 2014-09-17 NOTE — Progress Notes (Signed)
Patient awakening,vss,report to rn 

## 2014-09-17 NOTE — Patient Instructions (Addendum)
Avoid NSAIDs for 2 weeks   YOU HAD AN ENDOSCOPIC PROCEDURE TODAY AT [facility]: Refer to the procedure report that was given to you for any specific questions about what was found during the examination.  If the procedure report does not answer your questions, please call your gastroenterologist to clarify.  If you requested that your care partner not be given the details of your procedure findings, then the procedure report has been included in a sealed envelope for you to review at your convenience later.  YOU SHOULD EXPECT: Some feelings of bloating in the abdomen. Passage of more gas than usual.  Walking can help get rid of the air that was put into your GI tract during the procedure and reduce the bloating. If you had a lower endoscopy (such as a colonoscopy or flexible sigmoidoscopy) you may notice spotting of blood in your stool or on the toilet paper. If you underwent a bowel prep for your procedure, then you may not have a normal bowel movement for a few days.  DIET: Your first meal following the procedure should be a light meal and then it is ok to progress to your normal diet.  A half-sandwich or bowl of soup is an example of a good first meal.  Heavy or fried foods are harder to digest and may make you feel nauseous or bloated.  Likewise meals heavy in dairy and vegetables can cause extra gas to form and this can also increase the bloating.  Drink plenty of fluids but you should avoid alcoholic beverages for 24 hours.  ACTIVITY: Your care partner should take you home directly after the procedure.  You should plan to take it easy, moving slowly for the rest of the day.  You can resume normal activity the day after the procedure however you should NOT DRIVE or use heavy machinery for 24 hours (because of the sedation medicines used during the test).    SYMPTOMS TO REPORT IMMEDIATELY: A gastroenterologist can be reached at any hour.  During normal business hours, 8:30 AM to  5:00 PM Monday through Friday, call [phone].  After hours and on weekends, please call the GI answering service at [phone] who will take a message and have the physician on call contact you.   Following lower endoscopy (colonoscopy or flexible sigmoidoscopy):  Excessive amounts of blood in the stool  Significant tenderness or worsening of abdominal pains  Swelling of the abdomen that is new, acute  Fever of 100F or higher   FOLLOW UP: If any biopsies were taken you will be contacted by phone or by letter within the next 1-3 weeks.  Call your gastroenterologist if you have not heard about the biopsies in 3 weeks.  Our staff will call the home number listed on your records the next business day following your procedure to check on you and address any questions or concerns that you may have at that time regarding the information given to you following your procedure. This is a courtesy call and so if there is no answer at the home number and we have not heard from you through the emergency physician on call, we will assume that you have returned to your regular daily activities without incident.  SIGNATURES/CONFIDENTIALITY: You and/or your care partner have signed paperwork which will be entered into your electronic medical record.  These signatures attest to the fact that that the information above on your After Visit Summary has been reviewed and is understood.  Full responsibility of the confidentiality of this discharge information lies with you and/or your care-partner.  Polyp, diverticulosis and high fiber diet information given.

## 2014-09-17 NOTE — Progress Notes (Signed)
Called to room to assist during endoscopic procedure.  Patient ID and intended procedure confirmed with present staff. Received instructions for my participation in the procedure from the performing physician.  

## 2014-09-17 NOTE — Op Note (Signed)
[facility]  [address] [place], [id number]   COLONOSCOPY PROCEDURE REPORT  PATIENT: Fenton, Mongelli  MR#: [id number] BIRTHDATE: 1940/11/23 , 73  yrs. old GENDER: male ENDOSCOPIST: Inda Castle, MD REFERRED BY: PROCEDURE DATE:  09/17/2014 PROCEDURE:   Colonoscopy with biopsy, Colonoscopy with snare polypectomy, and Submucosal injection, any substance First Screening Colonoscopy - Avg.  risk and is 50 yrs.  old or older Yes.  Prior Negative Screening - Now for repeat screening. N/A  History of Adenoma - Now for follow-up colonoscopy & has been > or = to 3 yrs.  N/A  Polyps Removed Today? Yes. ASA CLASS:   Class II INDICATIONS:unexplained diarrhea. MEDICATIONS: Monitored anesthesia care and Propofol 200 mg IV  DESCRIPTION OF PROCEDURE:   After the risks benefits and alternatives of the procedure were thoroughly explained, informed consent was obtained.  The digital rectal exam revealed no abnormalities of the rectum.   The LB [id number]  endoscope was introduced through the anus and advanced to the cecum, which was identified by both the appendix and ileocecal valve. No adverse events experienced.   The quality of the prep was excellent using Suprep  The instrument was then slowly withdrawn as the colon was fully examined.      COLON FINDINGS: A sessile polyp measuring 10 mm in size was found in the ascending colon.  A polypectomy was performed with a cold snare.  The resection was complete, the polyp tissue was completely retrieved and sent to histology.   A sessile polyp measuring 3 mm in size was found at the cecum.  A polypectomy was performed with a cold snare.  The resection was complete, the polyp tissue was completely retrieved and sent to histology.   A sessile polyp measuring 5 mm in size was found in the ascending colon.  A polypectomy was performed with a cold snare.  The resection was complete, the polyp tissue was completely  retrieved and sent to histology.   A sessile polyp measuring 4 mm in size was found in the descending colon.  A polypectomy was performed with a cold snare.  The resection was complete, the polyp tissue was completely retrieved and sent to histology.   There was mild diverticulosis noted in the descending colon.   A sessile polyp measuring 40 mm in size was found in the rectosigmoid colon.proximally 8 cm from the anal verge  Multiple biopsies were performed using cold forceps.  A tattoo was applied.   The colonic mucosa appeared normal throughout the entire examined colon.  Multiple biopsies were performed using cold forceps randomly to rule out microscopic colitis.  Retroflexed views revealed no abnormalities. The time to cecum=4 minutes 32 seconds.  Withdrawal time=12 minutes 46 seconds.  The scope was withdrawn and the procedure completed. COMPLICATIONS: There were no immediate complications.  ENDOSCOPIC IMPRESSION: 1.   Sessile polyp was found in the ascending colon; polypectomy was performed with a cold snare 2.   Sessile polyp was found at the cecum; polypectomy was performed with a cold snare 3.   Sessile polyp was found in the ascending colon; polypectomy was performed with a cold snare 4.   Sessile polyp was found in the descending colon; polypectomy was performed with a cold snare 5.   Mild diverticulosis was noted in the descending colon 6.   Sessile polyp was found in the sigmoid colon; multiple biopsies were performed using cold forceps; a tattoo was applied 7.   The colonic mucosa appeared  normal throughout the entire examined colon; multiple biopsies were performed using cold forceps   RECOMMENDATIONS: Await pathology results  eSigned:  Inda Castle, MD 09/17/2014 8:56 AM   cc: Delia Chimes NP   PATIENT NAME:  Cadin, O[place] MR#: [id number]

## 2014-09-18 ENCOUNTER — Telehealth: Payer: Self-pay

## 2014-09-18 NOTE — Telephone Encounter (Signed)
  Follow up Call-  Call back number 09/17/2014  Post procedure Call Back phone  # [phone] cell  Permission to leave phone message Yes     Patient questions:  Do you have a fever, pain , or abdominal swelling? No. Pain Score  0 *  Have you tolerated food without any problems? Yes.    Have you been able to return to your normal activities? Yes.    Do you have any questions about your discharge instructions: Diet   No. Medications  No. Follow up visit  No.  Do you have questions or concerns about your Care? No.  Actions: * If pain score is 4 or above: No action needed, pain <4.  No problems per the pt. maw

## 2014-11-03 ENCOUNTER — Telehealth: Payer: Self-pay

## 2014-11-03 ENCOUNTER — Other Ambulatory Visit: Payer: Self-pay

## 2014-11-03 DIAGNOSIS — Z86010 Personal history of colonic polyps: Secondary | ICD-10-CM

## 2014-11-03 NOTE — Telephone Encounter (Signed)
Spoke with the patient. Date for flex-sig procedure decided. He wants to do this when he comes back to the country. He chooses April. Case scheduled and instructions mailed to him.

## 2014-11-03 NOTE — Telephone Encounter (Signed)
-----   Message from Virgina Evener, LPN sent at 075-GRM  2:47 PM EST ----- Call the patient to find out if he is going to [place] for Jan and Feb. He need flex sig schedule. He will need to be scheduled at [facility]   Notes Recorded by Inda Castle, MD on 09/24/2014 at 11:13 AM patient needs a sigmoidoscopy with ERBE or a large sigmoid polyp. Please inform the patient that all biopsies were benign and the polyps removed were benign. Arch polyp in the sigmoid can be removed to the colonoscope.   Specimen Collected: 09/17/14 12:00 AM Last Resulted: 09/23/14 12:00 AM

## 2015-01-05 ENCOUNTER — Encounter (HOSPITAL_COMMUNITY): Payer: Self-pay | Admitting: *Deleted

## 2015-01-13 DIAGNOSIS — G5601 Carpal tunnel syndrome, right upper limb: Secondary | ICD-10-CM | POA: Diagnosis not present

## 2015-01-13 DIAGNOSIS — M2550 Pain in unspecified joint: Secondary | ICD-10-CM | POA: Diagnosis not present

## 2015-01-13 DIAGNOSIS — M0589 Other rheumatoid arthritis with rheumatoid factor of multiple sites: Secondary | ICD-10-CM | POA: Diagnosis not present

## 2015-01-13 DIAGNOSIS — G5602 Carpal tunnel syndrome, left upper limb: Secondary | ICD-10-CM | POA: Diagnosis not present

## 2015-01-14 ENCOUNTER — Telehealth: Payer: Self-pay

## 2015-01-14 NOTE — Telephone Encounter (Signed)
-----   Message from Inda Castle, MD sent at 01/14/2015  8:20 AM EDT ----- 1 bottle of Mgcitrate the eve before the procedure, 1 bottle 3 hours before the procedure ----- Message -----    From: Greggory Keen, LPN    Sent: 075-GRM   3:37 PM      To: Inda Castle, MD  Flex Sig on 02/02/15. He cannot do the enema. How would you like to prep?

## 2015-01-14 NOTE — Telephone Encounter (Signed)
Patient instructed. He agrees to this plan.

## 2015-01-19 ENCOUNTER — Encounter (HOSPITAL_COMMUNITY): Payer: Self-pay | Admitting: *Deleted

## 2015-01-26 DIAGNOSIS — H2513 Age-related nuclear cataract, bilateral: Secondary | ICD-10-CM | POA: Diagnosis not present

## 2015-01-26 DIAGNOSIS — E119 Type 2 diabetes mellitus without complications: Secondary | ICD-10-CM | POA: Diagnosis not present

## 2015-02-02 ENCOUNTER — Encounter (HOSPITAL_COMMUNITY)
Admission: RE | Disposition: A | Discharge status: Home / Self Care | Payer: Self-pay | Source: Ambulatory Visit | Attending: Gastroenterology

## 2015-02-02 ENCOUNTER — Encounter (HOSPITAL_COMMUNITY): Payer: Self-pay | Admitting: *Deleted

## 2015-02-02 ENCOUNTER — Ambulatory Visit (HOSPITAL_COMMUNITY)
Admission: RE | Admit: 2015-02-02 | Discharge: 2015-02-02 | Disposition: A | Discharge status: Home / Self Care | Payer: Medicare Other | Source: Ambulatory Visit | Attending: Gastroenterology | Admitting: Gastroenterology

## 2015-02-02 ENCOUNTER — Ambulatory Visit (HOSPITAL_COMMUNITY): Payer: Medicare Other | Admitting: Anesthesiology

## 2015-02-02 DIAGNOSIS — I10 Essential (primary) hypertension: Secondary | ICD-10-CM | POA: Diagnosis not present

## 2015-02-02 DIAGNOSIS — Z86010 Personal history of colonic polyps: Secondary | ICD-10-CM

## 2015-02-02 DIAGNOSIS — D125 Benign neoplasm of sigmoid colon: Secondary | ICD-10-CM | POA: Diagnosis not present

## 2015-02-02 DIAGNOSIS — E119 Type 2 diabetes mellitus without complications: Secondary | ICD-10-CM | POA: Insufficient documentation

## 2015-02-02 DIAGNOSIS — Z794 Long term (current) use of insulin: Secondary | ICD-10-CM | POA: Insufficient documentation

## 2015-02-02 DIAGNOSIS — Z79899 Other long term (current) drug therapy: Secondary | ICD-10-CM | POA: Diagnosis not present

## 2015-02-02 DIAGNOSIS — M1990 Unspecified osteoarthritis, unspecified site: Secondary | ICD-10-CM | POA: Diagnosis not present

## 2015-02-02 DIAGNOSIS — D127 Benign neoplasm of rectosigmoid junction: Secondary | ICD-10-CM

## 2015-02-02 DIAGNOSIS — E785 Hyperlipidemia, unspecified: Secondary | ICD-10-CM | POA: Insufficient documentation

## 2015-02-02 DIAGNOSIS — Z87891 Personal history of nicotine dependence: Secondary | ICD-10-CM | POA: Insufficient documentation

## 2015-02-02 DIAGNOSIS — C187 Malignant neoplasm of sigmoid colon: Secondary | ICD-10-CM | POA: Diagnosis not present

## 2015-02-02 HISTORY — PX: FLEXIBLE SIGMOIDOSCOPY: SHX5431

## 2015-02-02 LAB — GLUCOSE, CAPILLARY: Glucose-Capillary: 185 mg/dL — ABNORMAL HIGH (ref 70–99)

## 2015-02-02 SURGERY — SIGMOIDOSCOPY, FLEXIBLE
Anesthesia: Monitor Anesthesia Care

## 2015-02-02 MED ORDER — EPHEDRINE SULFATE 50 MG/ML IJ SOLN
INTRAMUSCULAR | Status: AC
  Filled 2015-02-02: qty 1

## 2015-02-02 MED ORDER — MEPERIDINE HCL 100 MG/ML IJ SOLN: 6.2500 mg | INTRAMUSCULAR | Status: DC | PRN

## 2015-02-02 MED ORDER — FENTANYL CITRATE (PF) 100 MCG/2ML IJ SOLN
INTRAMUSCULAR | Status: DC | PRN
  Administered 2015-02-02: 100 ug via INTRAVENOUS

## 2015-02-02 MED ORDER — LACTATED RINGERS IV SOLN
INTRAVENOUS | Status: DC
  Administered 2015-02-02: 1000 mL via INTRAVENOUS

## 2015-02-02 MED ORDER — PROPOFOL 10 MG/ML IV BOLUS
INTRAVENOUS | Status: AC
  Filled 2015-02-02: qty 20

## 2015-02-02 MED ORDER — PROPOFOL INFUSION 10 MG/ML OPTIME
INTRAVENOUS | Status: DC | PRN
  Administered 2015-02-02: 140 ug/kg/min via INTRAVENOUS

## 2015-02-02 MED ORDER — PROPOFOL 10 MG/ML IV BOLUS
INTRAVENOUS | Status: AC
Start: 2015-02-02 — End: 2015-02-02
  Filled 2015-02-02: qty 20

## 2015-02-02 MED ORDER — SODIUM CHLORIDE 0.9 % IV SOLN: INTRAVENOUS | Status: DC

## 2015-02-02 MED ORDER — EPHEDRINE SULFATE 50 MG/ML IJ SOLN
INTRAMUSCULAR | Status: DC | PRN
  Administered 2015-02-02 (×2): 10 mg via INTRAVENOUS

## 2015-02-02 MED ORDER — FLEET ENEMA 7-19 GM/118ML RE ENEM
ENEMA | RECTAL | Status: AC
  Filled 2015-02-02: qty 1

## 2015-02-02 MED ORDER — PROMETHAZINE HCL 25 MG/ML IJ SOLN: 6.2500 mg | INTRAMUSCULAR | Status: DC | PRN

## 2015-02-02 MED ORDER — SODIUM CHLORIDE 0.9 % IJ SOLN
INTRAMUSCULAR | Status: AC
  Filled 2015-02-02: qty 10

## 2015-02-02 MED ORDER — FLEET ENEMA 7-19 GM/118ML RE ENEM
1.0000 | ENEMA | Freq: Once | RECTAL | Status: AC
  Administered 2015-02-02: 1 via RECTAL

## 2015-02-02 NOTE — Anesthesia Preprocedure Evaluation (Signed)
Anesthesia Evaluation  Patient identified by MRN, date of birth, ID band Patient awake    Reviewed: Allergy & Precautions, NPO status , Patient's Chart, lab work & pertinent test results  Airway Mallampati: II  TM Distance: >3 FB Neck ROM: Full    Dental no notable dental hx. (+) Edentulous Upper, Edentulous Lower   Pulmonary neg pulmonary ROS, former smoker,  breath sounds clear to auscultation  Pulmonary exam normal       Cardiovascular hypertension, Pt. on medications Rhythm:Regular Rate:Normal     Neuro/Psych negative neurological ROS  negative psych ROS   GI/Hepatic negative GI ROS, Neg liver ROS,   Endo/Other  diabetes, Type 2, Insulin Dependent  Renal/GU negative Renal ROS  negative genitourinary   Musculoskeletal negative musculoskeletal ROS (+)   Abdominal   Peds negative pediatric ROS (+)  Hematology negative hematology ROS (+)   Anesthesia Other Findings   Reproductive/Obstetrics negative OB ROS                             Anesthesia Physical Anesthesia Plan  ASA: III  Anesthesia Plan: MAC   Post-op Pain Management:    Induction:   Airway Management Planned: Natural Airway  Additional Equipment:   Intra-op Plan:   Post-operative Plan:   Informed Consent: I have reviewed the patients History and Physical, chart, labs and discussed the procedure including the risks, benefits and alternatives for the proposed anesthesia with the patient or authorized representative who has indicated his/her understanding and acceptance.   Dental advisory given  Plan Discussed with: CRNA  Anesthesia Plan Comments:         Anesthesia Quick Evaluation

## 2015-02-02 NOTE — H&P (Signed)
_                                                                                                                History of Present Illness:  Mr. Tibbetts is a 74 year old white male here for sigmoidoscopy and polypectomy.  Anoscopy in December, 2015 demonstrated multiple colon polyps which were removed.  A 4 cm sigmoid polyp was biopsied and demonstrated adenomatous changes only.   Past Medical History  Diagnosis Date  . Hypertension   . Diabetes mellitus   . Hyperlipidemia   . Arthritis   . Vitamin D deficiency    Past Surgical History  Procedure Laterality Date  . Cardiovascular stress test  10/12/1999    EF 63%. NO ISCHEMIA  . Tonsillectomy and adenoidectomy     family history includes Cancer in his father; Coronary artery disease in his brother; Diabetes in his brother and mother; Stroke in his mother. There is no history of Colon cancer, Esophageal cancer, Rectal cancer, or Stomach cancer. Current Facility-Administered Medications  Medication Dose Route Frequency Provider Last Rate Last Dose  . 0.9 %  sodium chloride infusion   Intravenous Continuous Inda Castle, MD      . lactated ringers infusion   Intravenous Continuous Montez Hageman, MD 10 mL/hr at 02/02/15 1002 1,000 mL at 02/02/15 1002  . meperidine (DEMEROL) injection 6.25-12.5 mg  6.25-12.5 mg Intravenous Q5 min PRN Montez Hageman, MD      . promethazine (PHENERGAN) injection 6.25-12.5 mg  6.25-12.5 mg Intravenous Q15 min PRN Montez Hageman, MD       Allergies as of 11/03/2014 - Review Complete 09/17/2014  Allergen Reaction Noted  . Amaryl  08/14/2011  . Avandia [rosiglitazone maleate]  08/15/2011  . Glucophage [metformin hydrochloride]  08/14/2011    reports that he quit smoking about 20 years ago. His smoking use included Cigarettes. He has never used smokeless tobacco. He reports that he does not drink alcohol or use illicit drugs.   Review of Systems: Pertinent positive and negative  review of systems were noted in the above HPI section. All other review of systems were otherwise negative.  Vital signs were reviewed in today's medical record Physical Exam: General: Well developed , well nourished, no acute distress Skin: anicteric Head: Normocephalic and atraumatic Eyes:  sclerae anicteric, EOMI Ears: Normal auditory acuity Mouth: No deformity or lesions Neck: Supple, no masses or thyromegaly Lungs: Clear throughout to auscultation Heart: Regular rate and rhythm; no murmurs, rubs or bruits Abdomen: Soft, non tender and non distended. No masses, hepatosplenomegaly or hernias noted. Normal Bowel sounds Rectal:deferred Musculoskeletal: Symmetrical with no gross deformities  Skin: No lesions on visible extremities Pulses:  Normal pulses noted Extremities: No clubbing, cyanosis, edema or deformities noted Neurological: Alert oriented x 4, grossly nonfocal Cervical Nodes:  No significant cervical adenopathy Inguinal Nodes: No significant inguinal adenopathy Psychological:  Alert and cooperative. Normal mood and affect  Impression-large sigmoid polyp  Plan sigmoidoscopy  and polypectomy

## 2015-02-02 NOTE — Transfer of Care (Signed)
Immediate Anesthesia Transfer of Care Note  Patient: Jake Gross  Procedure(s) Performed: Procedure(s) with comments: FLEXIBLE SIGMOIDOSCOPY (N/A) - ERBE  Patient Location: PACU and Endoscopy Unit  Anesthesia Type:MAC  Level of Consciousness: awake  Airway & Oxygen Therapy: Patient Spontanous Breathing and Patient connected to nasal cannula oxygen  Post-op Assessment: Report given to RN and Post -op Vital signs reviewed and stable  Post vital signs: Reviewed and stable  Last Vitals:  Filed Vitals:   02/02/15 0942  BP: 189/87  Pulse: 65  Temp: 36.9 C  Resp: 16    Complications: No apparent anesthesia complications

## 2015-02-02 NOTE — Anesthesia Postprocedure Evaluation (Signed)
  Anesthesia Post-op Note  Patient: Haematologist) Performed: Procedure(s) (LRB): FLEXIBLE SIGMOIDOSCOPY (N/A)  Patient Location: PACU  Anesthesia Type: MAC  Level of Consciousness: awake and alert   Airway and Oxygen Therapy: Patient Spontanous Breathing  Post-op Pain: mild  Post-op Assessment: Post-op Vital signs reviewed, Patient's Cardiovascular Status Stable, Respiratory Function Stable, Patent Airway and No signs of Nausea or vomiting  Last Vitals:  Filed Vitals:   02/02/15 1155  BP: 167/82  Pulse: 61  Temp:   Resp: 12    Post-op Vital Signs: stable   Complications: No apparent anesthesia complications

## 2015-02-02 NOTE — Op Note (Signed)
[facility] [place], [address]   FLEXIBLE SIGMOIDOSCOPY PROCEDURE REPORT  PATIENT: Jake Gross, Jake Gross  MR#: [id number] BIRTHDATE: July 05, 1941 , 74  yrs. old GENDER: male ENDOSCOPIST: Inda Castle, MD REFERRED BY: PROCEDURE DATE:  02/02/2015 PROCEDURE:   Sigmoidoscopy with ablation therapy and Sigmoidoscopy with snare, some mucosal injection ASA CLASS:   Class II INDICATIONS:large, benign sigmoid polyp. MEDICATIONS: Monitored anesthesia care  DESCRIPTION OF PROCEDURE:   After the risks benefits and alternatives of the procedure were thoroughly explained, informed consent was obtained.     The [id number])  endoscope was introduced through the anus  and advanced to the descending colon , The exam was Without limitations.    The quality of the prep was .  The instrument was then slowly withdrawn as the mucosa was fully examined.         COLON FINDINGS: A sessile polyp measuring > 17mm in size was found in the sigmoid colon at approximately 9 cm from the anal verge..  A saline Injection 17cc was given to lift the mucosal wall.  A polypectomy was performed in a piecemeal fashion using snare cautery.  The resection was complete, the polyp tissue was completely retrieved and sent to histology.  Destruction of tissue via ablation was performed on fringes of retained polyp.   There was little to no bleeding.    Retroflexion was not performed. The scope was then withdrawn from the patient and the procedure terminated.  COMPLICATIONS: There were no immediate complications.  ENDOSCOPIC IMPRESSION: 1.   large polyp, rectosigmoid   RECOMMENDATIONS: Await biopsy results follow-up sigmoidoscopy 3 months pending pathology  REPEAT EXAM:  eSigned:  Inda Castle, MD 02/02/2015 11:42 AM   CC:

## 2015-02-02 NOTE — Discharge Instructions (Signed)
No aspirin or anti-inflammatory medications for 2 weeks

## 2015-02-03 ENCOUNTER — Encounter (HOSPITAL_COMMUNITY): Payer: Self-pay | Admitting: Gastroenterology

## 2015-02-05 ENCOUNTER — Other Ambulatory Visit: Payer: Self-pay

## 2015-02-05 ENCOUNTER — Telehealth: Payer: Self-pay | Admitting: Gastroenterology

## 2015-02-05 DIAGNOSIS — C801 Malignant (primary) neoplasm, unspecified: Secondary | ICD-10-CM

## 2015-02-05 NOTE — Telephone Encounter (Signed)
Contacted the patient with his appointment information. Dr Johney Maine will see him 02/22/15 arrive 9:45 am. Take his insurance card and a photo ID.

## 2015-02-05 NOTE — Telephone Encounter (Signed)
Pathology of the resected polyp demonstrated some nests of malignant cells including at the margin.  It is my recommendation that he undergo a surgical resection because of the possibility of retained malignant cells.  This was explained to the patient and he is agreeable.

## 2015-02-10 ENCOUNTER — Other Ambulatory Visit: Payer: Self-pay

## 2015-02-10 ENCOUNTER — Telehealth: Payer: Self-pay

## 2015-02-10 ENCOUNTER — Other Ambulatory Visit (INDEPENDENT_AMBULATORY_CARE_PROVIDER_SITE_OTHER): Payer: Medicare Other

## 2015-02-10 DIAGNOSIS — E119 Type 2 diabetes mellitus without complications: Secondary | ICD-10-CM | POA: Diagnosis not present

## 2015-02-10 DIAGNOSIS — C189 Malignant neoplasm of colon, unspecified: Secondary | ICD-10-CM

## 2015-02-10 LAB — BUN: BUN: 21 mg/dL (ref 6–23)

## 2015-02-10 LAB — CREATININE, SERUM: CREATININE: 1.02 mg/dL (ref 0.40–1.50)

## 2015-02-10 NOTE — Telephone Encounter (Signed)
Patti, I have explained the EUS to the patient and he has the pamphlet. He is willing to go forward with it. He has a 3 pm CT on Monday 02/15/15. He has the appointment with the surgeon on 02/22/15. Let me know if you have any problems getting in touch with him. He is anxious to get everything done. Super nice man.

## 2015-02-10 NOTE — Telephone Encounter (Signed)
-----   Message from Milus Banister, MD sent at 02/10/2015  9:34 AM EDT ----- Chong Sicilian, Can you schedule him for lower EUS, radial only, in 3 weeks on EUS Thursday (to allow some time for recent rectal polyp resection site to heal.  +MAC.  Thanks  Leroy Libman take care of this after elizabeth lets him know about the test.  Thanks   ----- Message -----    From: Inda Castle, MD    Sent: 02/10/2015   9:01 AM      To: Milus Banister, MD, Greggory Keen, LPN  Please contact Mr. Schartz to set up an EUS in addition to CT.  Explain  that these tests are to determine whether any cancer cells might be beyond the polyp.

## 2015-02-11 ENCOUNTER — Other Ambulatory Visit: Payer: Self-pay

## 2015-02-11 DIAGNOSIS — K621 Rectal polyp: Secondary | ICD-10-CM

## 2015-02-11 NOTE — Telephone Encounter (Signed)
EUS scheduled, pt instructed and medications reviewed.  Patient instructions mailed to home.  Patient to call with any questions or concerns.  

## 2015-02-15 ENCOUNTER — Ambulatory Visit (INDEPENDENT_AMBULATORY_CARE_PROVIDER_SITE_OTHER)
Admission: RE | Admit: 2015-02-15 | Discharge: 2015-02-15 | Disposition: A | Discharge status: Home / Self Care | Payer: Medicare Other | Source: Ambulatory Visit | Attending: Gastroenterology | Admitting: Gastroenterology

## 2015-02-15 DIAGNOSIS — C189 Malignant neoplasm of colon, unspecified: Secondary | ICD-10-CM

## 2015-02-15 DIAGNOSIS — K8020 Calculus of gallbladder without cholecystitis without obstruction: Secondary | ICD-10-CM | POA: Diagnosis not present

## 2015-02-15 DIAGNOSIS — N400 Enlarged prostate without lower urinary tract symptoms: Secondary | ICD-10-CM | POA: Diagnosis not present

## 2015-02-15 MED ORDER — IOHEXOL 300 MG/ML  SOLN
100.0000 mL | Freq: Once | INTRAMUSCULAR | Status: AC | PRN
  Administered 2015-02-15: 100 mL via INTRAVENOUS

## 2015-02-17 ENCOUNTER — Other Ambulatory Visit: Payer: Self-pay

## 2015-02-17 DIAGNOSIS — C189 Malignant neoplasm of colon, unspecified: Secondary | ICD-10-CM

## 2015-02-18 ENCOUNTER — Other Ambulatory Visit: Payer: Self-pay | Admitting: *Deleted

## 2015-02-18 DIAGNOSIS — C189 Malignant neoplasm of colon, unspecified: Secondary | ICD-10-CM

## 2015-02-19 ENCOUNTER — Ambulatory Visit: Payer: Medicare Other

## 2015-02-19 ENCOUNTER — Ambulatory Visit
Admission: RE | Admit: 2015-02-19 | Discharge: 2015-02-19 | Disposition: A | Discharge status: Home / Self Care | Payer: Medicare Other | Source: Ambulatory Visit | Attending: Surgery | Admitting: Surgery

## 2015-02-19 ENCOUNTER — Other Ambulatory Visit: Payer: Self-pay | Admitting: Surgery

## 2015-02-19 ENCOUNTER — Other Ambulatory Visit: Payer: Medicare Other

## 2015-02-19 DIAGNOSIS — C189 Malignant neoplasm of colon, unspecified: Secondary | ICD-10-CM

## 2015-02-19 DIAGNOSIS — R918 Other nonspecific abnormal finding of lung field: Secondary | ICD-10-CM | POA: Diagnosis not present

## 2015-02-19 DIAGNOSIS — I712 Thoracic aortic aneurysm, without rupture: Secondary | ICD-10-CM | POA: Diagnosis not present

## 2015-02-19 MED ORDER — IOPAMIDOL (ISOVUE-300) INJECTION 61%
75.0000 mL | Freq: Once | INTRAVENOUS | Status: AC | PRN
  Administered 2015-02-19: 75 mL via INTRAVENOUS

## 2015-02-22 DIAGNOSIS — C20 Malignant neoplasm of rectum: Secondary | ICD-10-CM | POA: Diagnosis not present

## 2015-02-25 ENCOUNTER — Other Ambulatory Visit: Payer: Medicare Other

## 2015-03-04 ENCOUNTER — Encounter (HOSPITAL_COMMUNITY): Payer: Self-pay | Admitting: *Deleted

## 2015-03-11 ENCOUNTER — Ambulatory Visit (HOSPITAL_COMMUNITY)
Admission: RE | Admit: 2015-03-11 | Discharge: 2015-03-11 | Disposition: A | Discharge status: Home / Self Care | Payer: Medicare Other | Source: Ambulatory Visit | Attending: Gastroenterology | Admitting: Gastroenterology

## 2015-03-11 ENCOUNTER — Encounter (HOSPITAL_COMMUNITY)
Admission: RE | Disposition: A | Discharge status: Home / Self Care | Payer: Medicare Other | Source: Ambulatory Visit | Attending: Gastroenterology

## 2015-03-11 ENCOUNTER — Encounter (HOSPITAL_COMMUNITY): Payer: Self-pay | Admitting: *Deleted

## 2015-03-11 DIAGNOSIS — Z9049 Acquired absence of other specified parts of digestive tract: Secondary | ICD-10-CM | POA: Diagnosis not present

## 2015-03-11 DIAGNOSIS — M1990 Unspecified osteoarthritis, unspecified site: Secondary | ICD-10-CM | POA: Diagnosis not present

## 2015-03-11 DIAGNOSIS — Z86010 Personal history of colonic polyps: Secondary | ICD-10-CM | POA: Insufficient documentation

## 2015-03-11 DIAGNOSIS — E785 Hyperlipidemia, unspecified: Secondary | ICD-10-CM | POA: Diagnosis not present

## 2015-03-11 DIAGNOSIS — E119 Type 2 diabetes mellitus without complications: Secondary | ICD-10-CM | POA: Diagnosis not present

## 2015-03-11 DIAGNOSIS — Z888 Allergy status to other drugs, medicaments and biological substances status: Secondary | ICD-10-CM | POA: Insufficient documentation

## 2015-03-11 DIAGNOSIS — I10 Essential (primary) hypertension: Secondary | ICD-10-CM | POA: Diagnosis not present

## 2015-03-11 DIAGNOSIS — C20 Malignant neoplasm of rectum: Secondary | ICD-10-CM | POA: Insufficient documentation

## 2015-03-11 DIAGNOSIS — K621 Rectal polyp: Secondary | ICD-10-CM

## 2015-03-11 DIAGNOSIS — Z87891 Personal history of nicotine dependence: Secondary | ICD-10-CM | POA: Insufficient documentation

## 2015-03-11 DIAGNOSIS — E559 Vitamin D deficiency, unspecified: Secondary | ICD-10-CM | POA: Insufficient documentation

## 2015-03-11 DIAGNOSIS — K626 Ulcer of anus and rectum: Secondary | ICD-10-CM | POA: Diagnosis not present

## 2015-03-11 HISTORY — PX: EUS: SHX5427

## 2015-03-11 LAB — GLUCOSE, CAPILLARY: Glucose-Capillary: 121 mg/dL — ABNORMAL HIGH (ref 65–99)

## 2015-03-11 SURGERY — ULTRASOUND, LOWER GI TRACT, ENDOSCOPIC
Anesthesia: Moderate Sedation

## 2015-03-11 MED ORDER — FENTANYL CITRATE (PF) 100 MCG/2ML IJ SOLN
INTRAMUSCULAR | Status: DC | PRN
  Administered 2015-03-11 (×3): 25 ug via INTRAVENOUS

## 2015-03-11 MED ORDER — MIDAZOLAM HCL 10 MG/2ML IJ SOLN
INTRAMUSCULAR | Status: DC | PRN
  Administered 2015-03-11 (×3): 2 mg via INTRAVENOUS

## 2015-03-11 MED ORDER — SODIUM CHLORIDE 0.9 % IV SOLN
INTRAVENOUS | Status: DC
  Administered 2015-03-11: 500 mL via INTRAVENOUS

## 2015-03-11 MED ORDER — FENTANYL CITRATE (PF) 100 MCG/2ML IJ SOLN
INTRAMUSCULAR | Status: AC
  Filled 2015-03-11: qty 2

## 2015-03-11 MED ORDER — MIDAZOLAM HCL 10 MG/2ML IJ SOLN
INTRAMUSCULAR | Status: AC
  Filled 2015-03-11: qty 2

## 2015-03-11 NOTE — Op Note (Signed)
[facility] [place], [address]   ENDOSCOPIC ULTRASOUND PROCEDURE REPORT  PATIENT: Jake Gross, Jake Gross  MR#: [id number] BIRTHDATE: May 23, 1941  GENDER: male ENDOSCOPIST: Milus Banister, MD REFERRED BY:  Inda Castle, M.D. PROCEDURE DATE:  03/11/2015 PROCEDURE:   Lower EUS, flex sig with biopsy ASA CLASS:      Class II INDICATIONS:   1.  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 MEDICATIONS: Fentanyl 75 mcg IV and Versed 6 mg IV  DESCRIPTION OF PROCEDURE:   After the risks benefits and alternatives of the procedure were  explained, informed consent was obtained. The patient was then placed in the left, lateral, decubitus postion and IV sedation was administered. Throughout the procedure, the patients blood pressure, pulse and oxygen saturations were monitored continuously.  Under direct visualization, the PENTAX EUS SCOPE  endoscope was introduced through the anus  and advanced to the sigmoid colon .  Water was used as necessary to provide an acoustic interface.  Upon completion of the imaging, water was removed and the patient was sent to the recovery room in satisfactory condition.  Sigmoidoscopic findings: 1. Clear residual ulcer at the site of 01/2015 piecemeal resection, adjacent to previous SPOT tatoos. The ulcer was round, 1.5cm diameter.  New mucosa forming in the ulcer was not overtly neoplastic appearing.  This was biopsied and sent to pathology.  EUS findings: 1. The ulcer above was on the left anterior wall of the mid rectum. The distal edge of the ulcer is about 6cm from the anal verge.  The wall of the rectum at the site of the ulcer is non-specifically thickened.  This extended to the muscularis propria layer but not through it.  The thickening was not clearly from neoplasm, could be from inflammation related to polypectomy, cautery, healing.  If there is  residual, underlying tumor at the site, it does not appear to pass through the muscularis propris (would be T2 or less).   ENDOSCOPIC IMPRESSION: Ulcer at the site of previous piecemeal polypectomy lays on left anterior wall, distal edge 6cm from the anus. Mucosal biopsies were taken.  EUS specificity is decreased in setting of recent cautery, ulcer, inflammation but IF there is carcinoma still present it does not appear to be deeper than T2 level.  RECOMMENDATIONS: Await final pathlogy. Transanal surgical resection approach likely.  _______________________________ eSignedMilus Banister, MD 03/11/2015 2:14 PM   [id number] Johney Maine, MD

## 2015-03-11 NOTE — H&P (Signed)
  HPI: This is a man sent by Dr. Deatra Ina for large rectosigmoid polyp with adenocarcinoma (on pathology, margins unclear after extensive piecemeal resection 5 weeks ago    Past Medical History  Diagnosis Date  . Hypertension   . Diabetes mellitus   . Hyperlipidemia   . Arthritis   . Vitamin D deficiency     Past Surgical History  Procedure Laterality Date  . Cardiovascular stress test  10/12/1999    EF 63%. NO ISCHEMIA  . Tonsillectomy and adenoidectomy    . Flexible sigmoidoscopy N/A 02/02/2015    Procedure: FLEXIBLE SIGMOIDOSCOPY;  Surgeon: Inda Castle, MD;  Location: [facility];  Service: Endoscopy;  Laterality: N/A;  ERBE    Current Facility-Administered Medications  Medication Dose Route Frequency Provider Last Rate Last Dose  . 0.9 %  sodium chloride infusion   Intravenous Continuous Milus Banister, MD        Allergies as of 02/11/2015 - Review Complete 02/02/2015  Allergen Reaction Noted  . Amaryl  08/14/2011  . Avandia [rosiglitazone maleate]  08/15/2011  . Glucophage [metformin hydrochloride]  08/14/2011    Family History  Problem Relation Age of Onset  . Cancer Father     "all over"  . Coronary artery disease Brother   . Diabetes Brother   . Stroke Mother   . Diabetes Mother   . Colon cancer Neg Hx   . Esophageal cancer Neg Hx   . Rectal cancer Neg Hx   . Stomach cancer Neg Hx     History   Social History  . Marital Status: Divorced    Spouse Name: N/A  . Number of Children: 2  . Years of Education: N/A   Occupational History  . 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 Smoking status: Former Smoker    Types: Cigarettes    Quit date: 08/13/1994  . Smokeless tobacco: Never Used  . Alcohol Use: No  . Drug Use: No  . Sexual Activity: Not on file   Other Topics Concern  . 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

## 2015-03-11 NOTE — Discharge Instructions (Signed)

## 2015-03-12 ENCOUNTER — Encounter (HOSPITAL_COMMUNITY): Payer: Self-pay | Admitting: Gastroenterology

## 2015-03-17 ENCOUNTER — Other Ambulatory Visit: Payer: Self-pay | Admitting: Surgery

## 2015-04-04 NOTE — Anesthesia Preprocedure Evaluation (Addendum)
Anesthesia Evaluation  Patient identified by MRN, date of birth, ID band Patient awake    Reviewed: Allergy & Precautions, NPO status , Patient's Chart, lab work & pertinent test results, reviewed documented beta blocker date and time   Airway Mallampati: II   Neck ROM: Full    Dental  (+) Edentulous Lower, Edentulous Upper   Pulmonary former smoker (quit 1995),  breath sounds clear to auscultation        Cardiovascular hypertension, Pt. on medications Rhythm:Regular     Neuro/Psych negative neurological ROS  negative psych ROS   GI/Hepatic Neg liver ROS,   Endo/Other  diabetes, Type 2, Insulin Dependent  Renal/GU      Musculoskeletal   Abdominal (+) + obese,   Peds  Hematology   Anesthesia Other Findings   Reproductive/Obstetrics                           Anesthesia Physical Anesthesia Plan  ASA: III  Anesthesia Plan: General   Post-op Pain Management:    Induction: Intravenous  Airway Management Planned: Oral ETT  Additional Equipment:   Intra-op Plan:   Post-operative Plan: Extubation in OR  Informed Consent: I have reviewed the patients History and Physical, chart, labs and discussed the procedure including the risks, benefits and alternatives for the proposed anesthesia with the patient or authorized representative who has indicated his/her understanding and acceptance.     Plan Discussed with:   Anesthesia Plan Comments:         Anesthesia Quick Evaluation

## 2015-04-05 ENCOUNTER — Other Ambulatory Visit (HOSPITAL_COMMUNITY): Payer: Self-pay | Admitting: *Deleted

## 2015-04-06 ENCOUNTER — Encounter (HOSPITAL_COMMUNITY): Payer: Self-pay

## 2015-04-06 ENCOUNTER — Encounter (HOSPITAL_COMMUNITY)
Admission: RE | Admit: 2015-04-06 | Discharge: 2015-04-06 | Disposition: A | Discharge status: Home / Self Care | Payer: Medicare Other | Source: Ambulatory Visit | Attending: Surgery | Admitting: Surgery

## 2015-04-06 DIAGNOSIS — G8918 Other acute postprocedural pain: Secondary | ICD-10-CM | POA: Diagnosis not present

## 2015-04-06 DIAGNOSIS — K633 Ulcer of intestine: Secondary | ICD-10-CM | POA: Diagnosis present

## 2015-04-06 DIAGNOSIS — E669 Obesity, unspecified: Secondary | ICD-10-CM | POA: Diagnosis not present

## 2015-04-06 DIAGNOSIS — Z888 Allergy status to other drugs, medicaments and biological substances status: Secondary | ICD-10-CM

## 2015-04-06 DIAGNOSIS — E119 Type 2 diabetes mellitus without complications: Secondary | ICD-10-CM | POA: Diagnosis not present

## 2015-04-06 DIAGNOSIS — M1990 Unspecified osteoarthritis, unspecified site: Secondary | ICD-10-CM | POA: Diagnosis not present

## 2015-04-06 DIAGNOSIS — Z794 Long term (current) use of insulin: Secondary | ICD-10-CM | POA: Diagnosis not present

## 2015-04-06 DIAGNOSIS — Z6835 Body mass index (BMI) 35.0-35.9, adult: Secondary | ICD-10-CM

## 2015-04-06 DIAGNOSIS — Z7982 Long term (current) use of aspirin: Secondary | ICD-10-CM

## 2015-04-06 DIAGNOSIS — C20 Malignant neoplasm of rectum: Secondary | ICD-10-CM | POA: Diagnosis not present

## 2015-04-06 DIAGNOSIS — Z87891 Personal history of nicotine dependence: Secondary | ICD-10-CM

## 2015-04-06 DIAGNOSIS — I10 Essential (primary) hypertension: Secondary | ICD-10-CM | POA: Diagnosis present

## 2015-04-06 HISTORY — DX: Malignant (primary) neoplasm, unspecified: C80.1

## 2015-04-06 LAB — CBC
HCT: 41.9 % (ref 39.0–52.0)
HEMOGLOBIN: 14.1 g/dL (ref 13.0–17.0)
MCH: 28.9 pg (ref 26.0–34.0)
MCHC: 33.7 g/dL (ref 30.0–36.0)
MCV: 85.9 fL (ref 78.0–100.0)
PLATELETS: 200 10*3/uL (ref 150–400)
RBC: 4.88 MIL/uL (ref 4.22–5.81)
RDW: 14.4 % (ref 11.5–15.5)
WBC: 9.4 10*3/uL (ref 4.0–10.5)

## 2015-04-06 LAB — BASIC METABOLIC PANEL
Anion gap: 8 (ref 5–15)
BUN: 32 mg/dL — ABNORMAL HIGH (ref 6–20)
CHLORIDE: 104 mmol/L (ref 101–111)
CO2: 26 mmol/L (ref 22–32)
CREATININE: 1.13 mg/dL (ref 0.61–1.24)
Calcium: 9 mg/dL (ref 8.9–10.3)
GFR calc non Af Amer: >60 mL/min (ref >60)
Glucose, Bld: 298 mg/dL — ABNORMAL HIGH (ref 65–99)
POTASSIUM: 4.6 mmol/L (ref 3.5–5.1)
SODIUM: 138 mmol/L (ref 135–145)

## 2015-04-06 LAB — ABO/RH: ABO/RH(D): O POS

## 2015-04-06 NOTE — Progress Notes (Signed)
Called Dr. Johney Maine' office about CT scan of chest 02/19/15. He is aware and wants the patient to follow up with PCP Dr. Delia Chimes. Showed CT results to Dr. Jillyn Hidden with anesthesia. Okay to proceed with surgery.

## 2015-04-06 NOTE — Progress Notes (Signed)
   04/06/15 0830  OBSTRUCTIVE SLEEP APNEA  Have you ever been diagnosed with sleep apnea through a sleep study? No  Do you snore loudly (loud enough to be heard through closed doors)?  0  Do you often feel tired, fatigued, or sleepy during the daytime? 0  Has anyone observed you stop breathing during your sleep? 0  Do you have, or are you being treated for high blood pressure? 1  BMI more than 35 kg/m2? 0  Age over 72 years old? 1  Neck circumference greater than 40 cm/16 inches? 1  Gender: 1  Obstructive Sleep Apnea Score 4

## 2015-04-06 NOTE — Patient Instructions (Signed)
[place]  04/06/2015   Your procedure is scheduled on: Thursday 04/08/15  Report to [facility] Main  Entrance and follow signs to               [place] at 05:30 AM.  Call this number if you have problems the morning of surgery [phone]   Remember: ONLY 1 PERSON MAY GO WITH YOU TO SHORT STAY TO GET  READY MORNING OF [place].  Do not eat food or drink liquids :After Midnight.     Take these medicines the morning of surgery with A SIP OF WATER: toprol                               You may not have any metal on your body including hair pins and              piercings  Do not wear jewelry, make-up, lotions, powders or perfumes, deodorant             Do not wear nail polish.  Do not shave  48 hours prior to surgery.              Men may shave face and neck.  Do not bring valuables to the hospital. [place].  Contacts, dentures or bridgework may not be worn into surgery.  Leave suitcase in the car. After surgery it may be brought to your room.  _____________________________________________________________________           [facility] - Preparing for Surgery Before surgery, you can play an important role.  Because skin is not sterile, your skin needs to be as free of germs as possible.  You can reduce the number of germs on your skin by washing with CHG (chlorahexidine gluconate) soap before surgery.  CHG is an antiseptic cleaner which kills germs and bonds with the skin to continue killing germs even after washing. Please DO NOT use if you have an allergy to CHG or antibacterial soaps.  If your skin becomes reddened/irritated stop using the CHG and inform your nurse when you arrive at Short Stay. Do not shave (including legs and underarms) for at least 48 hours prior to the first CHG shower.  You may shave your face/neck. Please follow these instructions carefully:  1.  Shower with CHG Soap the  night before surgery and the  morning of Surgery.  2.  If you choose to wash your hair, wash your hair first as usual with your  normal  shampoo.  3.  After you shampoo, rinse your hair and body thoroughly to remove the  shampoo.                            4.  Use CHG as you would any other liquid soap.  You can apply chg directly  to the skin and wash                       Gently with a scrungie or clean washcloth.  5.  Apply the CHG Soap to your body ONLY FROM THE NECK DOWN.   Do not use on face/ open  Wound or open sores. Avoid contact with eyes, ears mouth and genitals (private parts).                       Wash face,  Genitals (private parts) with your normal soap.             6.  Wash thoroughly, paying special attention to the area where your surgery  will be performed.  7.  Thoroughly rinse your body with warm water from the neck down.  8.  DO NOT shower/wash with your normal soap after using and rinsing off  the CHG Soap.                9.  Pat yourself dry with a clean towel.            10.  Wear clean pajamas.            11.  Place clean sheets on your bed the night of your first shower and do not  sleep with pets. Day of Surgery : Do not apply any lotions/deodorants the morning of surgery.  Please wear clean clothes to the hospital/surgery center.  FAILURE TO FOLLOW THESE INSTRUCTIONS MAY RESULT IN THE CANCELLATION OF YOUR SURGERY PATIENT SIGNATURE_________________________________  NURSE SIGNATURE__________________________________  ________________________________________________________________________  WHAT IS A BLOOD TRANSFUSION? Blood Transfusion Information  A transfusion is the replacement of blood or some of its parts. Blood is made up of multiple cells which provide different functions.  Red blood cells carry oxygen and are used for blood loss replacement.  White blood cells fight against infection.  Platelets control bleeding.  Plasma  helps clot blood.  Other blood products are available for specialized needs, such as hemophilia or other clotting disorders. BEFORE THE TRANSFUSION  Who gives blood for transfusions?   Healthy volunteers who are fully evaluated to make sure their blood is safe. This is blood bank blood. Transfusion therapy is the safest it has ever been in the practice of medicine. Before blood is taken from a donor, a complete history is taken to make sure that person has no history of diseases nor engages in risky social behavior (examples are intravenous drug use or sexual activity with multiple partners). The donor's travel history is screened to minimize risk of transmitting infections, such as malaria. The donated blood is tested for signs of infectious diseases, such as HIV and hepatitis. The blood is then tested to be sure it is compatible with you in order to minimize the chance of a transfusion reaction. If you or a relative donates blood, this is often done in anticipation of surgery and is not appropriate for emergency situations. It takes many days to process the donated blood. RISKS AND COMPLICATIONS Although transfusion therapy is very safe and saves many lives, the main dangers of transfusion include:   Getting an infectious disease.  Developing a transfusion reaction. This is an allergic reaction to something in the blood you were given. Every precaution is taken to prevent this. The decision to have a blood transfusion has been considered carefully by your caregiver before blood is given. Blood is not given unless the benefits outweigh the risks. AFTER THE TRANSFUSION  Right after receiving a blood transfusion, you will usually feel much better and more energetic. This is especially true if your red blood cells have gotten low (anemic). The transfusion raises the level of the red blood cells which carry oxygen, and this usually causes an energy increase.  The  nurse administering the transfusion  will monitor you carefully for complications. HOME CARE INSTRUCTIONS  No special instructions are needed after a transfusion. You may find your energy is better. Speak with your caregiver about any limitations on activity for underlying diseases you may have. SEEK MEDICAL CARE IF:   Your condition is not improving after your transfusion.  You develop redness or irritation at the intravenous (IV) site. SEEK IMMEDIATE MEDICAL CARE IF:  Any of the following symptoms occur over the next 12 hours:  Shaking chills.  You have a temperature by mouth above 102 F (38.9 C), not controlled by medicine.  Chest, back, or muscle pain.  People around you feel you are not acting correctly or are confused.  Shortness of breath or difficulty breathing.  Dizziness and fainting.  You get a rash or develop hives.  You have a decrease in urine output.  Your urine turns a dark color or changes to pink, red, or brown. Any of the following symptoms occur over the next 10 days:  You have a temperature by mouth above 102 F (38.9 C), not controlled by medicine.  Shortness of breath.  Weakness after normal activity.  The white part of the eye turns yellow (jaundice).  You have a decrease in the amount of urine or are urinating less often.  Your urine turns a dark color or changes to pink, red, or brown. Document Released: 09/29/2000 Document Revised: 12/25/2011 Document Reviewed: 05/18/2008 [facility] Patient Information 2014 [place], [place].  _______________________________________________________________________

## 2015-04-07 LAB — HEMOGLOBIN A1C
Hgb A1c MFr Bld: 8.9 % — ABNORMAL HIGH (ref 4.8–5.6)
Mean Plasma Glucose: 209 mg/dL

## 2015-04-07 NOTE — Progress Notes (Signed)
HgA1C done 04/06/15 faxed via EPIC to Dr Johney [place].

## 2015-04-08 ENCOUNTER — Inpatient Hospital Stay (HOSPITAL_COMMUNITY): Payer: Medicare Other | Admitting: Anesthesiology

## 2015-04-08 ENCOUNTER — Inpatient Hospital Stay (HOSPITAL_COMMUNITY)
Admission: RE | Admit: 2015-04-08 | Discharge: 2015-04-10 | DRG: 375 | Disposition: A | Discharge status: Home / Self Care | Payer: Medicare Other | Source: Ambulatory Visit | Attending: Surgery | Admitting: Surgery

## 2015-04-08 ENCOUNTER — Encounter (HOSPITAL_COMMUNITY): Payer: Self-pay | Admitting: *Deleted

## 2015-04-08 ENCOUNTER — Encounter (HOSPITAL_COMMUNITY)
Admission: RE | Disposition: A | Discharge status: Home / Self Care | Payer: Self-pay | Source: Ambulatory Visit | Attending: Surgery

## 2015-04-08 DIAGNOSIS — Z888 Allergy status to other drugs, medicaments and biological substances status: Secondary | ICD-10-CM | POA: Diagnosis not present

## 2015-04-08 DIAGNOSIS — Z7982 Long term (current) use of aspirin: Secondary | ICD-10-CM | POA: Diagnosis not present

## 2015-04-08 DIAGNOSIS — M1990 Unspecified osteoarthritis, unspecified site: Secondary | ICD-10-CM | POA: Diagnosis present

## 2015-04-08 DIAGNOSIS — G8918 Other acute postprocedural pain: Secondary | ICD-10-CM | POA: Diagnosis not present

## 2015-04-08 DIAGNOSIS — C801 Malignant (primary) neoplasm, unspecified: Secondary | ICD-10-CM | POA: Diagnosis present

## 2015-04-08 DIAGNOSIS — Z87891 Personal history of nicotine dependence: Secondary | ICD-10-CM | POA: Diagnosis not present

## 2015-04-08 DIAGNOSIS — Z794 Long term (current) use of insulin: Secondary | ICD-10-CM | POA: Diagnosis not present

## 2015-04-08 DIAGNOSIS — C20 Malignant neoplasm of rectum: Secondary | ICD-10-CM | POA: Diagnosis not present

## 2015-04-08 DIAGNOSIS — K633 Ulcer of intestine: Secondary | ICD-10-CM | POA: Diagnosis not present

## 2015-04-08 DIAGNOSIS — Z6835 Body mass index (BMI) 35.0-35.9, adult: Secondary | ICD-10-CM | POA: Diagnosis not present

## 2015-04-08 DIAGNOSIS — D012 Carcinoma in situ of rectum: Secondary | ICD-10-CM | POA: Diagnosis not present

## 2015-04-08 DIAGNOSIS — I10 Essential (primary) hypertension: Secondary | ICD-10-CM | POA: Diagnosis not present

## 2015-04-08 DIAGNOSIS — R338 Other retention of urine: Secondary | ICD-10-CM | POA: Diagnosis present

## 2015-04-08 DIAGNOSIS — E119 Type 2 diabetes mellitus without complications: Secondary | ICD-10-CM | POA: Diagnosis not present

## 2015-04-08 DIAGNOSIS — E669 Obesity, unspecified: Secondary | ICD-10-CM | POA: Diagnosis present

## 2015-04-08 HISTORY — PX: PARTIAL PROCTECTOMY BY TEM: SHX6011

## 2015-04-08 LAB — TYPE AND SCREEN
ABO/RH(D): O POS
ANTIBODY SCREEN: NEGATIVE

## 2015-04-08 LAB — GLUCOSE, CAPILLARY
GLUCOSE-CAPILLARY: 247 mg/dL — AB (ref 65–99)
Glucose-Capillary: 103 mg/dL — ABNORMAL HIGH (ref 65–99)
Glucose-Capillary: 187 mg/dL — ABNORMAL HIGH (ref 65–99)
Glucose-Capillary: 242 mg/dL — ABNORMAL HIGH (ref 65–99)
Glucose-Capillary: 257 mg/dL — ABNORMAL HIGH (ref 65–99)
Glucose-Capillary: 250 mg/dL — ABNORMAL HIGH (ref 65–99)

## 2015-04-08 SURGERY — PARTIAL PROCTECTOMY BY TEM
Anesthesia: General

## 2015-04-08 MED ORDER — ALVIMOPAN 12 MG PO CAPS
12.0000 mg | ORAL_CAPSULE | Freq: Two times a day (BID) | ORAL | Status: DC
  Filled 2015-04-08 (×2): qty 1

## 2015-04-08 MED ORDER — PROMETHAZINE HCL 25 MG/ML IJ SOLN
6.2500 mg | INTRAMUSCULAR | Status: DC | PRN
  Administered 2015-04-08: 6.25 mg via INTRAVENOUS

## 2015-04-08 MED ORDER — ROCURONIUM BROMIDE 100 MG/10ML IV SOLN
INTRAVENOUS | Status: DC | PRN
  Administered 2015-04-08: 40 mg via INTRAVENOUS

## 2015-04-08 MED ORDER — HYDROMORPHONE HCL 1 MG/ML IJ SOLN
INTRAMUSCULAR | Status: DC | PRN
  Administered 2015-04-08 (×2): 0.5 mg via INTRAVENOUS

## 2015-04-08 MED ORDER — LOSARTAN POTASSIUM 50 MG PO TABS
100.0000 mg | ORAL_TABLET | Freq: Every day | ORAL | Status: DC
  Administered 2015-04-08 – 2015-04-10 (×3): 100 mg via ORAL
  Filled 2015-04-08 (×3): qty 2

## 2015-04-08 MED ORDER — HEPARIN SODIUM (PORCINE) 5000 UNIT/ML IJ SOLN
5000.0000 [IU] | Freq: Three times a day (TID) | INTRAMUSCULAR | Status: DC
  Administered 2015-04-08 – 2015-04-10 (×5): 5000 [IU] via SUBCUTANEOUS
  Filled 2015-04-08 (×8): qty 1

## 2015-04-08 MED ORDER — OXYCODONE HCL 5 MG PO TABS
5.0000 mg | ORAL_TABLET | ORAL | Status: DC | PRN
  Administered 2015-04-09: 5 mg via ORAL
  Administered 2015-04-09: 10 mg via ORAL
  Filled 2015-04-08: qty 2
  Filled 2015-04-08: qty 1

## 2015-04-08 MED ORDER — EPHEDRINE SULFATE 50 MG/ML IJ SOLN
INTRAMUSCULAR | Status: DC | PRN
  Administered 2015-04-08 (×3): 10 mg via INTRAVENOUS
  Administered 2015-04-08: 5 mg via INTRAVENOUS
  Administered 2015-04-08: 10 mg via INTRAVENOUS
  Administered 2015-04-08: 5 mg via INTRAVENOUS

## 2015-04-08 MED ORDER — LACTATED RINGERS IR SOLN
Status: DC | PRN
  Administered 2015-04-08: 1000 mL

## 2015-04-08 MED ORDER — FOLIC ACID 1 MG PO TABS
1.0000 mg | ORAL_TABLET | Freq: Every morning | ORAL | Status: DC
  Administered 2015-04-08 – 2015-04-10 (×3): 1 mg via ORAL
  Filled 2015-04-08 (×3): qty 1

## 2015-04-08 MED ORDER — ONDANSETRON HCL 4 MG/2ML IJ SOLN
INTRAMUSCULAR | Status: DC | PRN
  Administered 2015-04-08: 4 mg via INTRAVENOUS

## 2015-04-08 MED ORDER — ONDANSETRON HCL 4 MG PO TABS: 4.0000 mg | ORAL_TABLET | Freq: Four times a day (QID) | ORAL | Status: DC | PRN

## 2015-04-08 MED ORDER — METOPROLOL TARTRATE 12.5 MG HALF TABLET
12.5000 mg | ORAL_TABLET | Freq: Two times a day (BID) | ORAL | Status: DC
  Administered 2015-04-08: 12.5 mg via ORAL
  Filled 2015-04-08 (×4): qty 1

## 2015-04-08 MED ORDER — DEXAMETHASONE SODIUM PHOSPHATE 10 MG/ML IJ SOLN
INTRAMUSCULAR | Status: DC | PRN
  Administered 2015-04-08: 4 mg via INTRAVENOUS

## 2015-04-08 MED ORDER — METOPROLOL TARTRATE 1 MG/ML IV SOLN
5.0000 mg | Freq: Four times a day (QID) | INTRAVENOUS | Status: DC | PRN
  Filled 2015-04-08: qty 5

## 2015-04-08 MED ORDER — DIPHENHYDRAMINE HCL 12.5 MG/5ML PO ELIX: 12.5000 mg | ORAL_SOLUTION | Freq: Four times a day (QID) | ORAL | Status: DC | PRN

## 2015-04-08 MED ORDER — METOPROLOL SUCCINATE ER 50 MG PO TB24
50.0000 mg | ORAL_TABLET | Freq: Once | ORAL | Status: AC
  Administered 2015-04-08: 50 mg via ORAL
  Filled 2015-04-08: qty 1

## 2015-04-08 MED ORDER — 0.9 % SODIUM CHLORIDE (POUR BTL) OPTIME
TOPICAL | Status: DC | PRN
  Administered 2015-04-08: 1000 mL

## 2015-04-08 MED ORDER — PROPOFOL 10 MG/ML IV BOLUS
INTRAVENOUS | Status: DC | PRN
  Administered 2015-04-08: 180 mg via INTRAVENOUS

## 2015-04-08 MED ORDER — INSULIN LISPRO 100 UNIT/ML (KWIKPEN): 2.0000 [IU] | PEN_INJECTOR | Freq: Three times a day (TID) | SUBCUTANEOUS | Status: DC

## 2015-04-08 MED ORDER — DIPHENHYDRAMINE HCL 50 MG/ML IJ SOLN: 12.5000 mg | Freq: Four times a day (QID) | INTRAMUSCULAR | Status: DC | PRN

## 2015-04-08 MED ORDER — PROMETHAZINE HCL 25 MG/ML IJ SOLN
6.2500 mg | INTRAMUSCULAR | Status: DC | PRN
  Filled 2015-04-08: qty 1

## 2015-04-08 MED ORDER — SACCHAROMYCES BOULARDII 250 MG PO CAPS
250.0000 mg | ORAL_CAPSULE | Freq: Two times a day (BID) | ORAL | Status: DC
  Administered 2015-04-08 – 2015-04-10 (×5): 250 mg via ORAL
  Filled 2015-04-08 (×6): qty 1

## 2015-04-08 MED ORDER — INSULIN ASPART 100 UNIT/ML ~~LOC~~ SOLN
0.0000 [IU] | Freq: Three times a day (TID) | SUBCUTANEOUS | Status: DC
Start: 2015-04-08 — End: 2015-04-10
  Administered 2015-04-08: 3 [IU] via SUBCUTANEOUS
  Administered 2015-04-09 (×2): 2 [IU] via SUBCUTANEOUS

## 2015-04-08 MED ORDER — PHENOL 1.4 % MT LIQD: 2.0000 | OROMUCOSAL | Status: DC | PRN

## 2015-04-08 MED ORDER — DEXTROSE 5 % IV SOLN
2.0000 g | INTRAVENOUS | Status: AC
  Administered 2015-04-08: 2 g via INTRAVENOUS
  Filled 2015-04-08: qty 2

## 2015-04-08 MED ORDER — GLYCOPYRROLATE 0.2 MG/ML IJ SOLN
INTRAMUSCULAR | Status: DC | PRN
  Administered 2015-04-08: 0.2 mg via INTRAVENOUS
  Administered 2015-04-08: 0.1 mg via INTRAVENOUS
  Administered 2015-04-08: 0.6 mg via INTRAVENOUS

## 2015-04-08 MED ORDER — LOSARTAN POTASSIUM-HCTZ 100-25 MG PO TABS: 1.0000 | ORAL_TABLET | Freq: Every day | ORAL | Status: DC

## 2015-04-08 MED ORDER — LIP MEDEX EX OINT
1.0000 "application " | TOPICAL_OINTMENT | Freq: Two times a day (BID) | CUTANEOUS | Status: DC
  Administered 2015-04-09 (×2): 1 via TOPICAL

## 2015-04-08 MED ORDER — CISATRACURIUM BESYLATE (PF) 10 MG/5ML IV SOLN
INTRAVENOUS | Status: DC | PRN
  Administered 2015-04-08: 3 mg via INTRAVENOUS
  Administered 2015-04-08: 4 mg via INTRAVENOUS
  Administered 2015-04-08: 2 mg via INTRAVENOUS
  Administered 2015-04-08: 3 mg via INTRAVENOUS

## 2015-04-08 MED ORDER — LACTATED RINGERS IV SOLN
INTRAVENOUS | Status: DC | PRN
  Administered 2015-04-08 (×3): via INTRAVENOUS

## 2015-04-08 MED ORDER — MIDAZOLAM HCL 5 MG/5ML IJ SOLN
INTRAMUSCULAR | Status: DC | PRN
  Administered 2015-04-08: 1 mg via INTRAVENOUS

## 2015-04-08 MED ORDER — HYDROCHLOROTHIAZIDE 25 MG PO TABS
25.0000 mg | ORAL_TABLET | Freq: Every day | ORAL | Status: DC
  Administered 2015-04-08 – 2015-04-10 (×3): 25 mg via ORAL
  Filled 2015-04-08 (×3): qty 1

## 2015-04-08 MED ORDER — ZOLPIDEM TARTRATE 5 MG PO TABS: 5.0000 mg | ORAL_TABLET | Freq: Every evening | ORAL | Status: DC | PRN

## 2015-04-08 MED ORDER — ALUM & MAG HYDROXIDE-SIMETH 200-200-20 MG/5ML PO SUSP: 30.0000 mL | Freq: Four times a day (QID) | ORAL | Status: DC | PRN

## 2015-04-08 MED ORDER — SODIUM CHLORIDE 0.9 % IJ SOLN: 3.0000 mL | INTRAMUSCULAR | Status: DC | PRN

## 2015-04-08 MED ORDER — SODIUM CHLORIDE 0.9 % IV SOLN: 250.0000 mL | INTRAVENOUS | Status: DC | PRN

## 2015-04-08 MED ORDER — HEPARIN SODIUM (PORCINE) 5000 UNIT/ML IJ SOLN
5000.0000 [IU] | Freq: Once | INTRAMUSCULAR | Status: AC
  Administered 2015-04-08: 5000 [IU] via SUBCUTANEOUS
  Filled 2015-04-08: qty 1

## 2015-04-08 MED ORDER — HYDROMORPHONE HCL 1 MG/ML IJ SOLN
0.5000 mg | INTRAMUSCULAR | Status: DC | PRN
  Administered 2015-04-09: 1 mg via INTRAVENOUS
  Administered 2015-04-09: 2 mg via INTRAVENOUS
  Administered 2015-04-09: 0.5 mg via INTRAVENOUS
  Filled 2015-04-08 (×2): qty 1
  Filled 2015-04-08: qty 2

## 2015-04-08 MED ORDER — ACETAMINOPHEN 500 MG PO TABS
1000.0000 mg | ORAL_TABLET | Freq: Three times a day (TID) | ORAL | Status: DC
  Administered 2015-04-08 – 2015-04-09 (×5): 1000 mg via ORAL
  Filled 2015-04-08 (×8): qty 2

## 2015-04-08 MED ORDER — FENTANYL CITRATE (PF) 100 MCG/2ML IJ SOLN
INTRAMUSCULAR | Status: DC | PRN
  Administered 2015-04-08: 50 ug via INTRAVENOUS
  Administered 2015-04-08: 100 ug via INTRAVENOUS

## 2015-04-08 MED ORDER — MAGIC MOUTHWASH
15.0000 mL | Freq: Four times a day (QID) | ORAL | Status: DC | PRN
  Filled 2015-04-08: qty 15

## 2015-04-08 MED ORDER — MENTHOL 3 MG MT LOZG
1.0000 | LOZENGE | OROMUCOSAL | Status: DC | PRN
  Filled 2015-04-08 (×2): qty 9

## 2015-04-08 MED ORDER — ROSUVASTATIN CALCIUM 20 MG PO TABS
20.0000 mg | ORAL_TABLET | Freq: Every day | ORAL | Status: DC
  Administered 2015-04-08 – 2015-04-09 (×2): 20 mg via ORAL
  Filled 2015-04-08 (×3): qty 1

## 2015-04-08 MED ORDER — SODIUM CHLORIDE 0.9 % IJ SOLN
INTRAMUSCULAR | Status: AC
  Filled 2015-04-08: qty 50

## 2015-04-08 MED ORDER — METOPROLOL TARTRATE 12.5 MG HALF TABLET
12.5000 mg | ORAL_TABLET | Freq: Two times a day (BID) | ORAL | Status: DC | PRN
  Filled 2015-04-08: qty 1

## 2015-04-08 MED ORDER — MEPERIDINE HCL 50 MG/ML IJ SOLN: 6.2500 mg | INTRAMUSCULAR | Status: DC | PRN

## 2015-04-08 MED ORDER — OXYCODONE HCL 5 MG PO TABS: 5.0000 mg | ORAL_TABLET | Freq: Four times a day (QID) | ORAL | Status: DC | PRN

## 2015-04-08 MED ORDER — ONDANSETRON HCL 4 MG/2ML IJ SOLN
4.0000 mg | Freq: Four times a day (QID) | INTRAMUSCULAR | Status: DC | PRN
  Administered 2015-04-09 (×2): 4 mg via INTRAVENOUS
  Filled 2015-04-08 (×2): qty 2

## 2015-04-08 MED ORDER — FENTANYL CITRATE (PF) 100 MCG/2ML IJ SOLN: 25.0000 ug | INTRAMUSCULAR | Status: DC | PRN

## 2015-04-08 MED ORDER — LIDOCAINE HCL (CARDIAC) 20 MG/ML IV SOLN
INTRAVENOUS | Status: DC | PRN
  Administered 2015-04-08: 100 mg via INTRAVENOUS

## 2015-04-08 MED ORDER — NEOSTIGMINE METHYLSULFATE 10 MG/10ML IV SOLN
INTRAVENOUS | Status: DC | PRN
  Administered 2015-04-08: 1 mg via INTRAVENOUS
  Administered 2015-04-08: 4 mg via INTRAVENOUS

## 2015-04-08 MED ORDER — ASPIRIN 81 MG PO CHEW
162.0000 mg | CHEWABLE_TABLET | Freq: Every day | ORAL | Status: DC
  Administered 2015-04-08 – 2015-04-09 (×2): 162 mg via ORAL
  Filled 2015-04-08 (×3): qty 2

## 2015-04-08 MED ORDER — DEXTROSE 5 % IV SOLN
2.0000 g | Freq: Two times a day (BID) | INTRAVENOUS | Status: AC
  Administered 2015-04-08: 2 g via INTRAVENOUS
  Filled 2015-04-08: qty 2

## 2015-04-08 MED ORDER — BUPIVACAINE LIPOSOME 1.3 % IJ SUSP
20.0000 mL | Freq: Once | INTRAMUSCULAR | Status: DC
  Filled 2015-04-08: qty 20

## 2015-04-08 MED ORDER — SODIUM CHLORIDE 0.9 % IV SOLN
INTRAVENOUS | Status: DC
  Administered 2015-04-08: 13:00:00 via INTRAVENOUS

## 2015-04-08 MED ORDER — LACTATED RINGERS IV BOLUS (SEPSIS): 1000.0000 mL | Freq: Three times a day (TID) | INTRAVENOUS | Status: DC | PRN

## 2015-04-08 MED ORDER — SODIUM CHLORIDE 0.9 % IJ SOLN
3.0000 mL | Freq: Two times a day (BID) | INTRAMUSCULAR | Status: DC
  Administered 2015-04-08 – 2015-04-09 (×2): 3 mL via INTRAVENOUS

## 2015-04-08 MED ORDER — ALVIMOPAN 12 MG PO CAPS
12.0000 mg | ORAL_CAPSULE | Freq: Once | ORAL | Status: AC
  Administered 2015-04-08: 12 mg via ORAL
  Filled 2015-04-08: qty 1

## 2015-04-08 MED ORDER — INSULIN GLARGINE 100 UNIT/ML ~~LOC~~ SOLN
75.0000 [IU] | Freq: Two times a day (BID) | SUBCUTANEOUS | Status: DC
  Administered 2015-04-08 (×2): 75 [IU] via SUBCUTANEOUS
  Filled 2015-04-08 (×3): qty 0.75

## 2015-04-08 MED ORDER — PROMETHAZINE HCL 25 MG/ML IJ SOLN
INTRAMUSCULAR | Status: AC
  Filled 2015-04-08: qty 1

## 2015-04-08 SURGICAL SUPPLY — 53 items
BLADE SURG 15 STRL LF DISP TIS (BLADE) ×1 IMPLANT
BLADE SURG 15 STRL SS (BLADE)
BRIEF STRETCH FOR OB PAD LRG (UNDERPADS AND DIAPERS) IMPLANT
CABLE HIGH FREQUENCY MONO STRZ (ELECTRODE) ×3 IMPLANT
DRAPE C-ARM 42X120 X-RAY (DRAPES) IMPLANT
DRAPE CAMERA CLOSED 9X96 (DRAPES) ×3 IMPLANT
DRAPE LAPAROTOMY T 102X78X121 (DRAPES) ×2 IMPLANT
DRAPE WARM FLUID 44X44 (DRAPE) ×3 IMPLANT
DRSG PAD ABDOMINAL 8X10 ST (GAUZE/BANDAGES/DRESSINGS) IMPLANT
ELECT REM PT RETURN 9FT ADLT (ELECTROSURGICAL) ×3
ELECTRODE REM PT RTRN 9FT ADLT (ELECTROSURGICAL) ×1 IMPLANT
GAUZE SPONGE 4X4 12PLY STRL (GAUZE/BANDAGES/DRESSINGS) IMPLANT
GAUZE SPONGE 4X4 16PLY XRAY LF (GAUZE/BANDAGES/DRESSINGS) ×3 IMPLANT
GLOVE ECLIPSE 8.0 STRL XLNG CF (GLOVE) ×6 IMPLANT
GLOVE INDICATOR 8.0 STRL GRN (GLOVE) ×6 IMPLANT
GOWN STRL REUS W/TWL XL LVL3 (GOWN DISPOSABLE) ×8 IMPLANT
HOLDER FOLEY CATH W/STRAP (MISCELLANEOUS) ×2 IMPLANT
IV LACTATED RINGERS 1000ML (IV SOLUTION) IMPLANT
KIT BASIN OR (CUSTOM PROCEDURE TRAY) ×3 IMPLANT
LEGGING LITHOTOMY PAIR STRL (DRAPES) ×2 IMPLANT
LUBRICANT JELLY K Y 4OZ (MISCELLANEOUS) ×3 IMPLANT
NEEDLE HYPO 22GX1.5 SAFETY (NEEDLE) ×3 IMPLANT
PACK BASIC VI WITH GOWN DISP (CUSTOM PROCEDURE TRAY) ×3 IMPLANT
PAD ABD 8X10 STRL (GAUZE/BANDAGES/DRESSINGS) ×2 IMPLANT
PENCIL BUTTON HOLSTER BLD 10FT (ELECTRODE) ×1 IMPLANT
RETRACTOR STAY HOOK 5MM (MISCELLANEOUS) IMPLANT
RETRACTOR WILSON SYSTEM (INSTRUMENTS) IMPLANT
SCISSORS LAP 5X35 DISP (ENDOMECHANICALS) ×3 IMPLANT
SET IRRIG TUBING LAPAROSCOPIC (IRRIGATION / IRRIGATOR) ×3 IMPLANT
SHEARS HARMONIC ACE PLUS 36CM (ENDOMECHANICALS) ×3 IMPLANT
STOPCOCK 4 WAY LG BORE MALE ST (IV SETS) ×3 IMPLANT
SUT PDS AB 2-0 CT2 27 (SUTURE) ×4 IMPLANT
SUT PDS AB 3-0 SH 27 (SUTURE) ×2 IMPLANT
SUT SILK 2 0 (SUTURE)
SUT SILK 2 0 SH CR/8 (SUTURE) IMPLANT
SUT SILK 2-0 18XBRD TIE 12 (SUTURE) IMPLANT
SUT SILK 3 0 SH 30 (SUTURE) IMPLANT
SUT SILK 3 0 SH CR/8 (SUTURE) IMPLANT
SUT V-LOC BARB 180 2/0GR6 GS22 (SUTURE) ×15
SUT VIC AB 2-0 UR6 27 (SUTURE) IMPLANT
SUT VIC AB 3-0 SH 27 (SUTURE)
SUT VIC AB 3-0 SH 27XBRD (SUTURE) IMPLANT
SUTURE V-LC BRB 180 2/0GR6GS22 (SUTURE) IMPLANT
SYR 20CC LL (SYRINGE) ×3 IMPLANT
SYR BULB IRRIGATION 50ML (SYRINGE) IMPLANT
TOWEL OR 17X26 10 PK STRL BLUE (TOWEL DISPOSABLE) ×5 IMPLANT
TOWEL OR NON WOVEN STRL DISP B (DISPOSABLE) ×3 IMPLANT
TRAY FOLEY W/METER SILVER 14FR (SET/KITS/TRAYS/PACK) ×1 IMPLANT
TRAY FOLEY W/METER SILVER 16FR (SET/KITS/TRAYS/PACK) ×2 IMPLANT
TUBING CONNECTING 10 (TUBING) IMPLANT
TUBING CONNECTING 10' (TUBING)
TUBING INSUFFLATION 10FT LAP (TUBING) ×3 IMPLANT
YANKAUER SUCT BULB TIP 10FT TU (MISCELLANEOUS) ×3 IMPLANT

## 2015-04-08 NOTE — Op Note (Signed)
04/08/2015  10:48 AM  PATIENT:  Jake Gross  74 y.o. 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  POST-OPERATIVE DIAGNOSIS:  Rectal Adenocarcinoma s/p polypectomy   PROCEDURE:  Procedure(s): TEM PARTIAL PROCTECTOMY OF RECTAL MASS  SURGEON:  Surgeon(s): Michael Boston, MD  ASSISTANT: Karleen Dolphin, PA-student    ANESTHESIA:   local and general  EBL:  Total I/O In: 1000 [I.V.:1000] Out: 125 [Urine:125]  Delay start of Pharmacological VTE agent (>24hrs) due to surgical blood loss or risk of bleeding:  no  DRAINS: none   SPECIMEN:  Source of Specimen:  RECTAL MASS   DISPOSITION OF SPECIMEN:  PATHOLOGY  COUNTS:  YES  PLAN OF CARE: Admit for overnight observation  PATIENT DISPOSITION:  PACU - hemodynamically stable.  INDICATION: Patient status post polypectomy of mid rectal polyp. Cancer noted within it. Endoscopic ultrasound showing no deeper tumor but biopsy showing atypia. Recommendation made for more aggressive resection. Refused low anterior resection. Therefore as a compromise offered transanal endoscopic microsurgical partial proctectomy for better margins a better local control.  The anatomy & physiology of the digestive tract was discussed.  The pathophysiology of the rectal pathology was discussed.  Natural history risks without surgery was discussed.   I feel the risks of no intervention will lead to serious problems that outweigh the operative risks; therefore, I recommended surgery.    Laparoscopic & open abdominal techniques were discussed.  I recommended we start with a partial proctectomy by transanal endoscopic microsurgery (TEM) for excisional biopsy to remove the  pathology and hopefully cure and/or control the pathology.  This technique can offer less operative risk and faster post-operative recovery.  Possible need for immediate or later abdominal surgery for further treatment was discussed.   Risks such as bleeding, abscess, reoperation, ostomy, heart attack, death, and other risks were discussed.   I noted a good likelihood this will help address the problem.  Goals of post-operative recovery were discussed as well.  We will work to minimize complications.  An educational handout was given as well.  Questions were answered.  The patient expresses understanding & wishes to proceed with surgery.  OR FINDINGS: Obvious scar left anterior/anterolateral rectal wall. 8-9 centimeters maximal to the anal verge.  The resulting mass was 5x5x4cmin size  Pin placement on pathology specimen: Proximal margin: RED Distal margin: GREEN Right lateral margin: YELLOW Left lateral margin: WHITE  The 75% circumference closure rests 6-10 cm from the anal verge in the anterior location  DESCRIPTION: Informed consent was confirmed.  Patient received general anesthesia without difficulty.  Foley catheter sterilely placed.  Sequential compression devices active during the entire case.  The patient was placed in the prone position, taking extra care to secure and protect the patient appropriately.  The perineum and perianal regions were prepped and draped in a sterile fashion.  Surgical timeout confirmed our plan.  I did a gentle digital rectal examination with gradual anal dilation to allow placement of the 4 cm TEO Stortz scope.  This was secured to the bed using the TEO clamping system.  We induced carbon dioxide insufflation intraluminally.  I oriented the Stortz scope and the patient such that the specimen rested towards the floor at the 6:00 position.  DESCRIPTION: Informed consent was confirmed.  Patient  received general anesthesia without difficulty.  Foley catheter  sterilely placed.  Sequential compression devices active during the entire case.  The patient was placed in the prone position, taking extra care to secure and protect the patient appropriately.  Loose diarrhea stool from bowel prep evacuated.  The perineum and perianal regions were prepped and draped in a sterile fashion.  .   Surgical timeout confirmed our plan.  I did a gentle digital rectal examination with gradual anal dilation to allow placement of the 4 cm TEO Stortz scope.  This was secured to the bed using the TEO clamping system.  We induced carbon dioxide insufflation intraluminally.  I oriented the Stortz scope and the patient such that the specimen rested towards the floor at the 6:00 position.  I could easily identify the mass.  Copious irrigation to clear out the rectum until it was much more clear. Over a liter. I went ahead and used tip point cautery to mark 2 cm margins circumferentially.  I then did a full thickness transection at the distal margin.  I came around laterally.  Switched over to harmonic dissection.  Lifted the specimen off the anterior & left lateral pelvic canal for a good deep margin.  I gradually came more proximally.  I did breach through the peritoneal reflection anteriorly. Transected at the proximal margin.  I ensured hemostasis.  I inspected the main specimen and pinned it on thick cork board.  Pins as noted above.  I walked the specimen down to pathology and showed the [facility] pathology team for proper orientation.    I went back in and scrubbed in.  Hemostasis excellent.  I reapproximated the wound with a 2-0 V-lock horizontal mattress stitch to bring the middle part of the proximal anterior rectum into the extraperitoneal pelvis and connected to the distal rectum down to help close the middle of the wound.  I then closed the wound using a  2-0 V-lock serrated stitch in a running fashion x4, two stitches for each side.  This brought things together well.  I did  meticulous inspection with fine tip instruments to confirm good watertight closure   Hemostasis excellent.  The lumen was quite patent.  Carbon dioxide evacuated & instruments removed.  The patient is being extubated go to recovery room.  I updated the status of the patient to the patient's brother.  I made recommendations.  I answered questions.  Understanding & appreciation was expressed.    Adin Hector, M.D., F.A.C.S. Gastrointestinal and Minimally Invasive Surgery [facility] [address], [place], [place] [address] [phone] Main / Paging

## 2015-04-08 NOTE — Progress Notes (Signed)
Patient complains of bladder pressure.  Explained to patient we can I&O cath.  Refuses at this time states he is not that uncomfortable.  Will reevaluate

## 2015-04-08 NOTE — Anesthesia Postprocedure Evaluation (Signed)
  Anesthesia Post-op Note  Patient: Haematologist) Performed: Procedure(s): TEM PARTIAL PROCTECTOMY OF RECTAL MASS (N/A)  Patient Location: PACU  Anesthesia Type:General  Level of Consciousness: awake and alert   Airway and Oxygen Therapy: Patient Spontanous Breathing and Patient connected to nasal cannula oxygen  Post-op Pain: mild  Post-op Assessment: Post-op Vital signs reviewed and Patient's Cardiovascular Status Stable              Post-op Vital Signs: Reviewed and stable  Last Vitals:  Filed Vitals:   04/08/15 0513  BP: 145/72  Pulse: 70  Temp: 36.3 C  Resp: 18    Complications: No apparent anesthesia complications

## 2015-04-08 NOTE — Discharge Instructions (Signed)
SURGERY: POST OP INSTRUCTIONS (Surgery for small bowel obstruction, colon resection, etc)   1. DIET: Follow a light bland diet the first 24 hours after arrival home, such as soup, liquids, crackers, etc.  Be sure to include lots of fluids daily.  Avoid fast food or heavy meals as your are more likely to get nauseated.  Stay on a low fat diet the next few days after surgery.  Gradually add a fiber supplement to your diet over the next week.   Your should try to eat a low-fat, high fiber diet the rest of your life thereafter (See Below).   2. Take your usually prescribed home medications unless otherwise directed.  OK to take aspirin.    If you are on strong blood thinners (warfarin/Coumadin, Plavix, Xerelto, Eliquis, etc), discuss with your surgeon, medicine PCP, and/or cardiologist for instructions on when to restart the blood thinner & if blood monitoring is needed (PT/INR blood check, etc)  3. PAIN CONTROL:  Pain after surgery or related to activity is often due to strain/injury to muscle, tendon, nerves and/or incisions.  This pain is usually short-term and will improve in a few months.   Many people find it helpful to do the following things TOGETHER to help speed the process of healing and to get back to regular activity more quickly:  1. Avoid heavy physical activity at first a. No lifting greater than 20 pounds at first, then increase to lifting as tolerated over the next few weeks b. Do not push through the pain.  Listen to your body and avoid positions and maneuvers than reproduce the pain.  Wait a few days before trying something more intense c. Walking is okay as tolerated, but go slowly and stop when getting sore.  If you can walk 30 minutes without stopping or pain, you can try more intense activity (running, jogging, aerobics, cycling, swimming, treadmill, sex, sports, weightlifting, etc ) d. Remember: If it hurts to do it, then dont do it!  2. Take Anti-inflammatory  medication a. Choose ONE of the following over-the-counter medications: i.            Acetaminophen 559m tabs (Tylenol) 1-2 pills with every meal and just before bedtime (avoid if you have liver problems) ii.            Naproxen 2222mtabs (ex. Aleve) 1-2 pills twice a day (avoid if you have kidney, stomach, IBD, or bleeding problems) iii. Ibuprofen 20068mabs (ex. Advil, Motrin) 3-4 pills with every meal and just before bedtime (avoid if you have kidney, stomach, IBD, or bleeding problems) b. Take with food/snack around the clock for 1-2 weeks i. This helps the muscle and nerve tissues become less irritable and calm down faster  3. Use a Heating pad or Ice/Cold Pack a. Most patients will experience some swelling and bruising around the incisions.  Swelling and bruising can take several weeks to resolve. i. Ice packs or heating pads (30-60 minutes up to 6 times a day) will help. ii. Use ice for the first few days to help decrease swelling and bruising iii. Switch to heat to help relax tight/sore spots and speed recovery.  iv. Some people prefer to use ice alone, heat alone, alternating between ice & heat.  Experiment to what works for you a. May use warm bath/hottub  or showers  4. Try Gentle Massage and/or Stretching  a. at the area of pain many times a day b. stop if you feel pain - do not  overdo it 5. Prescription for pain medication (such as oxycodone, hydrocodone, etc) should be given to you upon discharge.  Take your pain medication as prescribed. a. If you are having problems/concerns with the prescription medicine (does not control pain, nausea, vomiting, rash, itching, etc), please call us [phone] to see if we need to switch you to a different pain medicine that will work better for you and/or control your side effect better. b. If you need a refill on your pain medication, please contact your pharmacy.  They will contact our office to request authorization. Prescriptions will  not be filled after 5 pm or on week-ends. c.  Try these steps together to help you body heal faster and avoid making things get worse.  Doing just one of these things may not be enough.    If you are not getting better after two weeks or are noticing you are getting worse, contact our office for further advice; we may need to re-evaluate you & see what other things we can do to help.  i.  GETTING TO GOOD BOWEL HEALTH. Irregular bowel habits such as constipation and diarrhea can lead to many problems over time.  Having one soft bowel movement a day is the most important way to prevent further problems.  The anorectal canal is designed to handle stretching and feces to safely manage our ability to get rid of solid waste (feces, poop, stool) out of our body.  BUT, hard constipated stools can act like ripping concrete bricks and diarrhea can be a burning fire to this very sensitive area of our body, causing inflamed hemorrhoids, anal fissures, increasing risk is perirectal abscesses, abdominal pain/bloating, an making irritable bowel worse.      The goal: ONE SOFT BOWEL MOVEMENT A DAY!  To have soft, regular bowel movements:   Drink plenty of fluids, consider 4-6 tall glasses of water a day.    Take plenty of fiber.  Fiber is the undigested part of plant food that passes into the colon, acting s natures broom to encourage bowel motility and movement.  Fiber can absorb and hold large amounts of water. This results in a larger, bulkier stool, which is soft and easier to pass. Work gradually over several weeks up to 6 servings a day of fiber (25g a day even more if needed) in the form of: o Vegetables -- Root (potatoes, carrots, turnips), leafy green (lettuce, salad greens, celery, spinach), or cooked high residue (cabbage, broccoli, etc) o Fruit -- Fresh (unpeeled skin & pulp), Dried (prunes, apricots, cherries, etc ),  or stewed ( applesauce)  o Whole grain breads, pasta, etc (whole wheat)  o Bran  cereals   Bulking Agents -- This type of water-retaining fiber generally is easily obtained each day by one of the following:  o Psyllium bran -- The psyllium plant is remarkable because its ground seeds can retain so much water. This product is available as Metamucil, Konsyl, Effersyllium, Per Diem Fiber, or the less expensive generic preparation in drug and health food stores. Although labeled a laxative, it really is not a laxative.  o Methylcellulose -- This is another fiber derived from wood which also retains water. It is available as Citrucel. o Polyethylene Glycol - and artificial fiber commonly called Miralax or Glycolax.  It is helpful for people with gassy or bloated feelings with regular fiber o Flax Seed - a less gassy fiber than psyllium  No reading or other relaxing activity while on the toilet. If  bowel movements take longer than 5 minutes, you are too constipated  AVOID CONSTIPATION.  High fiber and water intake usually takes care of this.  Sometimes a laxative is needed to stimulate more frequent bowel movements, but   Laxatives are not a good long-term solution as it can wear the colon out.  They can help jump-start bowels if constipated, but should be relied on constantly without discussing with your doctor o Osmotics (Milk of Magnesia, Fleets phosphosoda, Magnesium citrate, MiraLax, GoLytely) are safer than  o Stimulants (Senokot, Castor Oil, Dulcolax, Ex Lax)    o Avoid taking laxatives for more than 7 days in a row.   IF SEVERELY CONSTIPATED, try a Bowel Retraining Program: o Do not use laxatives.  o Eat a diet high in roughage, such as bran cereals and leafy vegetables.  o Drink six (6) ounces of prune or apricot juice each morning.  o Eat two (2) large servings of stewed fruit each day.  o Take one (1) heaping tablespoon of a psyllium-based bulking agent twice a day. Use sugar-free sweetener when possible to avoid excessive calories.  o Eat a normal breakfast.   o Set aside 15 minutes after breakfast to sit on the toilet, but do not strain to have a bowel movement.  o If you do not have a bowel movement by the third day, use an enema and repeat the above steps.   Controlling diarrhea o Switch to liquids and simpler foods for a few days to avoid stressing your intestines further. o Avoid dairy products (especially milk & ice cream) for a short time.  The intestines often can lose the ability to digest lactose when stressed. o Avoid foods that cause gassiness or bloating.  Typical foods include beans and other legumes, cabbage, broccoli, and dairy foods.  Every person has some sensitivity to other foods, so listen to our body and avoid those foods that trigger problems for you. o Adding fiber (Citrucel, Metamucil, psyllium, Miralax) gradually can help thicken stools by absorbing excess fluid and retrain the intestines to act more normally.  Slowly increase the dose over a few weeks.  Too much fiber too soon can backfire and cause cramping & bloating. o Probiotics (such as active yogurt, Align, etc) may help repopulate the intestines and colon with normal bacteria and calm down a sensitive digestive tract.  Most studies show it to be of mild help, though, and such products can be costly. o Medicines: - Bismuth subsalicylate (ex. Kayopectate, Pepto Bismol) every 30 minutes for up to 6 doses can help control diarrhea.  Avoid if pregnant. - Loperamide (Immodium) can slow down diarrhea.  Start with two tablets (51m total) first and then try one tablet every 6 hours.  Avoid if you are having fevers or severe pain.  If you are not better or start feeling worse, stop all medicines and call your doctor for advice o Call your doctor if you are getting worse or not better.  Sometimes further testing (cultures, endoscopy, X-ray studies, bloodwork, etc) may be needed to help diagnose and treat the cause of the diarrhea.  TROUBLESHOOTING IRREGULAR BOWELS 1) Avoid extremes  of bowel movements (no bad constipation/diarrhea) 2) Miralax 17gm mixed in 8oz. water or juice-daily. May use BID as needed.  3) Gas-x,Phazyme, etc. as needed for gas & bloating.  4) Soft,bland diet. No spicy,greasy,fried foods.  5) Prilosec over-the-counter as needed  6) May hold gluten/wheat products from diet to see if symptoms improve.  7)  May  try probiotics (Align, Activa, etc) to help calm the bowels down 7) If symptoms become worse call back immediately.   4. Wash / shower every day.  You may shower over the incision / wound.  Avoid baths until 5 days after surgery.  Continue to shower over incision(s) after the dressing is off.  5. Remove your waterproof bandages 5 days after surgery.  You may leave the incision open to air.  Remove any wicks or ribbons in your wound.  If you have an open wound, please see wound care instructions. You may replace a dressing/Band-Aid to cover the incision for comfort if you wish.  6. ACTIVITIES as tolerated:   a. You may resume regular (light) daily activities beginning the next day--such as daily self-care, walking, climbing stairs--gradually increasing activities as tolerated.  If you can walk 30 minutes without difficulty, it is safe to try more intense activity such as jogging, treadmill, bicycling, low-impact aerobics, swimming, etc. b. Save the most intensive and strenuous activity for last (Usually 3-6 weeks after surgery) such as sit-ups, heavy lifting, contact sports, etc  Refrain from any heavy lifting or straining until you are off narcotics for pain control.   c. DO NOT PUSH THROUGH PAIN.  Let pain be your guide: If it hurts to do something, don't do it.  Pain is your body warning you to avoid that activity for another week until the pain goes down. d. You may drive when you are no longer taking prescription pain medication, you can comfortably wear a seatbelt, and you can safely maneuver your car and apply brakes. e. Dennis Bast may have sexual  intercourse when it is comfortable. If it hurts to do something, don't do it.  7. FOLLOW UP in our office a. Please call [facility] at [phone] to set up an appointment to see your surgeon in the office for a follow-up appointment approximately 2-3 weeks after your surgery. b. Make sure that you call for this appointment the day you arrive home to insure a convenient appointment time.  8. IF YOU HAVE DISABILITY OR FAMILY LEAVE FORMS, BRING THEM TO THE OFFICE FOR PROCESSING.  DO NOT GIVE THEM TO YOUR DOCTOR.   WHEN TO CALL us [phone]: 1. Poor pain control 2. Reactions / problems with new medications (rash/itching, nausea, etc)  3. Fever over 101.5 F (38.5 C) 4. Inability to urinate 5. Nausea and/or vomiting 6. Worsening swelling or bruising 7. Continued bleeding from incision. 8. Increased pain, redness, or drainage from the incision  The clinic staff is available to answer your questions during regular business hours (8:30am-5pm).  Please dont hesitate to call and ask to speak to one of our nurses for clinical concerns.   A surgeon from [facility] is always on call at the hospitals   If you have a medical emergency, go to the nearest emergency room or call 911.    [facility], [place], [place], [place], [place]  [address] ? MAIN: [phone] ? TOLL FREE: [phone] ? FAX [phone] [email or web address]  GETTING TO GOOD BOWEL HEALTH. Irregular bowel habits such as constipation and diarrhea can lead to many problems over time.  Having one soft bowel movement a day is the most important way to prevent further problems.  The anorectal canal is designed to handle stretching and feces to safely manage our ability to get rid of solid waste (feces, poop, stool) out of our body.  BUT, hard constipated stools can  act like ripping concrete bricks and diarrhea can be a burning fire to this very sensitive area of our body,  causing inflamed hemorrhoids, anal fissures, increasing risk is perirectal abscesses, abdominal pain/bloating, an making irritable bowel worse.      The goal: ONE SOFT BOWEL MOVEMENT A DAY!  To have soft, regular bowel movements:   Drink plenty of fluids, consider 4-6 tall glasses of water a day.    Take plenty of fiber.  Fiber is the undigested part of plant food that passes into the colon, acting s natures broom to encourage bowel motility and movement.  Fiber can absorb and hold large amounts of water. This results in a larger, bulkier stool, which is soft and easier to pass. Work gradually over several weeks up to 6 servings a day of fiber (25g a day even more if needed) in the form of: o Vegetables -- Root (potatoes, carrots, turnips), leafy green (lettuce, salad greens, celery, spinach), or cooked high residue (cabbage, broccoli, etc) o Fruit -- Fresh (unpeeled skin & pulp), Dried (prunes, apricots, cherries, etc ),  or stewed ( applesauce)  o Whole grain breads, pasta, etc (whole wheat)  o Bran cereals   Bulking Agents -- This type of water-retaining fiber generally is easily obtained each day by one of the following:  o Psyllium bran -- The psyllium plant is remarkable because its ground seeds can retain so much water. This product is available as Metamucil, Konsyl, Effersyllium, Per Diem Fiber, or the less expensive generic preparation in drug and health food stores. Although labeled a laxative, it really is not a laxative.  o Methylcellulose -- This is another fiber derived from wood which also retains water. It is available as Citrucel. o Polyethylene Glycol - and artificial fiber commonly called Miralax or Glycolax.  It is helpful for people with gassy or bloated feelings with regular fiber o Flax Seed - a less gassy fiber than psyllium  No reading or other relaxing activity while on the toilet. If bowel movements take longer than 5 minutes, you are too constipated  AVOID  CONSTIPATION.  High fiber and water intake usually takes care of this.  Sometimes a laxative is needed to stimulate more frequent bowel movements, but   Laxatives are not a good long-term solution as it can wear the colon out.  They can help jump-start bowels if constipated, but should be relied on constantly without discussing with your doctor o Osmotics (Milk of Magnesia, Fleets phosphosoda, Magnesium citrate, MiraLax, GoLytely) are safer than  o Stimulants (Senokot, Castor Oil, Dulcolax, Ex Lax)    o Avoid taking laxatives for more than 7 days in a row.   IF SEVERELY CONSTIPATED, try a Bowel Retraining Program: o Do not use laxatives.  o Eat a diet high in roughage, such as bran cereals and leafy vegetables.  o Drink six (6) ounces of prune or apricot juice each morning.  o Eat two (2) large servings of stewed fruit each day.  o Take one (1) heaping tablespoon of a psyllium-based bulking agent twice a day. Use sugar-free sweetener when possible to avoid excessive calories.  o Eat a normal breakfast.  o Set aside 15 minutes after breakfast to sit on the toilet, but do not strain to have a bowel movement.  o If you do not have a bowel movement by the third day, use an enema and repeat the above steps.   Controlling diarrhea o Switch to liquids and simpler foods for a few  days to avoid stressing your intestines further. o Avoid dairy products (especially milk & ice cream) for a short time.  The intestines often can lose the ability to digest lactose when stressed. o Avoid foods that cause gassiness or bloating.  Typical foods include beans and other legumes, cabbage, broccoli, and dairy foods.  Every person has some sensitivity to other foods, so listen to our body and avoid those foods that trigger problems for you. o Adding fiber (Citrucel, Metamucil, psyllium, Miralax) gradually can help thicken stools by absorbing excess fluid and retrain the intestines to act more normally.  Slowly increase  the dose over a few weeks.  Too much fiber too soon can backfire and cause cramping & bloating. o Probiotics (such as active yogurt, Align, etc) may help repopulate the intestines and colon with normal bacteria and calm down a sensitive digestive tract.  Most studies show it to be of mild help, though, and such products can be costly. o Medicines: - Bismuth subsalicylate (ex. Kayopectate, Pepto Bismol) every 30 minutes for up to 6 doses can help control diarrhea.  Avoid if pregnant. - Loperamide (Immodium) can slow down diarrhea.  Start with two tablets (4mg  total) first and then try one tablet every 6 hours.  Avoid if you are having fevers or severe pain.  If you are not better or start feeling worse, stop all medicines and call your doctor for advice o Call your doctor if you are getting worse or not better.  Sometimes further testing (cultures, endoscopy, X-ray studies, bloodwork, etc) may be needed to help diagnose and treat the cause of the diarrhea.  TROUBLESHOOTING IRREGULAR BOWELS 1) Avoid extremes of bowel movements (no bad constipation/diarrhea) 2) Miralax 17gm mixed in 8oz. water or juice-daily. May use BID as needed.  3) Gas-x,Phazyme, etc. as needed for gas & bloating.  4) Soft,bland diet. No spicy,greasy,fried foods.  5) Prilosec over-the-counter as needed  6) May hold gluten/wheat products from diet to see if symptoms improve.  7)  May try probiotics (Align, Activa, etc) to help calm the bowels down 7) If symptoms become worse call back immediately.  Colorectal Cancer Colorectal cancer is an abnormal growth of tissue (tumor) in the colon or rectum that is cancerous (malignant). Unlike noncancerous (benign) tumors, malignant tumors can spread to other parts of your body. The colon is the large bowel or large intestine. The rectum is the last several inches of the colon.  RISK FACTORS The exact cause of colorectal cancer is unknown. However, the following factors may increase your  chances of getting colorectal cancer:   Age older than 108 years.   Abnormal growths (polyps) on the inner wall of the colon or rectum.   Diabetes.   African American race.   Family history of hereditary nonpolyposis colorectal cancer. This condition is caused by changes in the genes that are responsible for repairing mismatched DNA.   Personal history of cancer. A person who has already had colorectal cancer may develop it a second time. Also, women with a history of ovarian, uterine, or breast cancer are at a somewhat higher risk of developing colorectal cancer.  Certain hereditary conditions.  Eating a diet that is high in fat (especially animal fat) and low in fiber, fruits, and vegetables.  Sedentary lifestyle.  Inflammatory bowel disease, including ulcerative colitis and Crohn's disease.   Smoking.   Excessive alcohol use.  SYMPTOMS Early colorectal cancer often does not cause symptoms. As the cancer grows, symptoms may include:  Changes in bowel habits.  Diarrhea.   Constipation.   Feeling like the bowel does not empty completely after a bowel movement.   Blood in the stool.   Stools that are narrower than usual.   Abdominal discomfort, pain, bloating, fullness, or cramps.  Frequent gas pain.   Unexplained weight loss.   Constant tiredness.   Nausea and vomiting.  DIAGNOSIS  Your health care provider will ask about your medical history. He or she may also perform a number of procedures, such as:   A physical exam.  A digital rectal exam.  A fecal occult blood test.  A barium enema.  Blood tests.   X-rays.   Imaging tests, such as CT scans or MRIs.   Taking a tissue sample (biopsy) from your colon or rectum to look for cancer cells.   A sigmoidoscopy to view the inside of the last part of your colon.   A colonoscopy to view the inside of your entire colon.   An endorectal ultrasound to see how deep a rectal tumor  has grown and whether the cancer has spread to lymph nodes or other nearby tissues.  Your cancer will be staged to determine its severity and extent. Staging is a careful attempt to find out the size of the tumor, whether the cancer has spread, and if so, to what parts of the body. You may need to have more tests to determine the stage of your cancer. The test results will help determine what treatment plan is best for you.   Stage 0. The cancer is found only in the innermost lining of the colon or rectum.   Stage I. The cancer has grown into the inner wall of the colon or rectum. The cancer has not yet reached the outer wall of the colon.   Stage II. The cancer extends more deeply into or through the wall of the colon or rectum. It may have invaded nearby tissue, but cancer cells have not spread to the lymph nodes.   Stage III. The cancer has spread to nearby lymph nodes but not to other parts of the body.   Stage IV. The cancer has spread to other parts of the body, such as the liver or lungs.  Your health care provider may tell you the detailed stage of your cancer, which includes both a number and a letter.  TREATMENT  Depending on the type and stage, colorectal cancer may be treated with surgery, radiation therapy, chemotherapy, targeted therapy, or radiofrequency ablation. Some people have a combination of these therapies. Surgery may be done to remove the polyps from your colon. In early stages, your health care provider may be able to do this during a colonoscopy. In later stages, surgery may be done to remove part of your colon.  HOME CARE INSTRUCTIONS   Take medicines only as directed by your health care provider.   Maintain a healthy diet.   Consider joining a support group. This may help you learn to cope with the stress of having colorectal cancer.   Seek advice to help you manage treatment of side effects.   Keep all follow-up visits as directed by your health care  provider.   Inform your cancer specialist if you are admitted to the hospital.  SEEK MEDICAL CARE IF:  Your diarrhea or constipation does not go away.   Your bowel habits change.  You have increased abdominal pain.   You notice new fatigue or weakness.  You lose weight. Document  Released: 10/02/2005 Document Revised: 02/16/2014 Document Reviewed: 03/27/2013 [facility] Patient Information 2015 [place], [place]. This information is not intended to replace advice given to you by your health care provider. Make sure you discuss any questions you have with your health care provider.

## 2015-04-08 NOTE — Anesthesia Procedure Notes (Signed)
Procedure Name: Intubation Date/Time: 04/08/2015 7:39 AM Performed by: Sherian Maroon A Pre-anesthesia Checklist: Patient identified, Emergency Drugs available, Suction available and Patient being monitored Patient Re-evaluated:Patient Re-evaluated prior to inductionOxygen Delivery Method: Circle System Utilized Preoxygenation: Pre-oxygenation with 100% oxygen Intubation Type: IV induction Ventilation: Mask ventilation without difficulty Laryngoscope Size: Miller and 2 Grade View: Grade I Tube type: Oral Tube size: 7.5 mm Number of attempts: 1 Airway Equipment and Method: Stylet Placement Confirmation: ETT inserted through vocal cords under direct vision,  positive ETCO2 and breath sounds checked- equal and bilateral Secured at: 22 cm Tube secured with: Tape Dental Injury: Teeth and Oropharynx as per pre-operative assessment

## 2015-04-08 NOTE — H&P (Signed)
[place] 02/22/2015 10:34 AM Location: [facility] Patient #: [id number] DOB: [birth year] Divorced / Language: Cleophus Molt / Race: White Male  History of Present Illness Adin Hector MD; 02/22/2015 1:37 PM) Patient words: colon cancer.  The patient is a 74 year old male who presents with colorectal cancer. Patient sent by his gastroenterologist, Dr. Alben Spittle, for concern of adenocarcinoma found within a polyp status post polypectomy. Pleasant obese male. History of colon polyps. Underwent colonoscopy. Most removed. Had 5cm polyp in distal colon 9 cm from anal verge. Recommendation made for polypectomy resection. Patient delayed after travel this was done last month. Specimen showed diffuse high-grade dysplasia and focus of adenocarcinoma. Area tattooed. Discussed at tumor board. Surgical consultation recommended. Patient not exactly certain why he is here. He comes today by himself. Often has many bowel movements a day. 5-10. Often loose. Never had abdominal surgery. Can walk about a half hour without difficulty. Does not recall any history of polyps 10 years before   Other Problems Elbert Ewings, CMA; 02/22/2015 10:35 AM) Arthritis Colon Cancer Diabetes Mellitus High blood pressure  Diagnostic Studies History Elbert Ewings, CMA; 02/22/2015 10:35 AM) Colonoscopy within last year  Allergies Elbert Ewings, CMA; 02/22/2015 10:35 AM) Amaryl *ANTIDIABETICS* Glucophage *ANTIDIABETICS*  Medication History Elbert Ewings, CMA; 02/22/2015 10:36 AM) BD Pen Needle Mini U/F (31G X 5 MM Misc,) Active. Crestor (20MG  Tablet, Oral) Active. Folic Acid (1MG  Tablet, Oral) Active. Lantus SoloStar (100UNIT/ML Soln Pen-inj, Subcutaneous) Active. Methotrexate (2.5MG  Tablet, Oral) Active. Metoprolol Succinate ER (50MG  Tablet ER 24HR, Oral) Active. Aspirin (81MG  Tablet, Oral as needed) Active. Medications Reconciled  Family History Elbert Ewings, [place]; 02/22/2015 10:35  AM) Family history unknown First Degree Relatives  Review of Systems Elbert Ewings CMA; 02/22/2015 10:35 AM) General Not Present- Appetite Loss, Chills, Fatigue, Fever, Night Sweats, Weight Gain and Weight Loss. Skin Not Present- Change in Wart/Mole, Dryness, Hives, Jaundice, New Lesions, Non-Healing Wounds, Rash and Ulcer. HEENT Present- Hearing Loss. Not Present- Earache, Hoarseness, Nose Bleed, Oral Ulcers, Ringing in the Ears, Seasonal Allergies, Sinus Pain, Sore Throat, Visual Disturbances, Wears glasses/contact lenses and Yellow Eyes. Respiratory Not Present- Bloody sputum, Chronic Cough, Difficulty Breathing, Snoring and Wheezing. Cardiovascular Present- Leg Cramps. Not Present- Chest Pain, Difficulty Breathing Lying Down, Palpitations, Rapid Heart Rate, Shortness of Breath and Swelling of Extremities. Gastrointestinal Not Present- Abdominal Pain, Bloating, Bloody Stool, Change in Bowel Habits, Chronic diarrhea, Constipation, Difficulty Swallowing, Excessive gas, Gets full quickly at meals, Hemorrhoids, Indigestion, Nausea, Rectal Pain and Vomiting. Male Genitourinary Present- Nocturia. Not Present- Blood in Urine, Change in Urinary Stream, Frequency, Impotence, Painful Urination, Urgency and Urine Leakage. Musculoskeletal Not Present- Back Pain, Joint Pain, Joint Stiffness, Muscle Pain, Muscle Weakness and Swelling of Extremities. Neurological Not Present- Decreased Memory, Fainting, Headaches, Numbness, Seizures, Tingling, Tremor, Trouble walking and Weakness. Psychiatric Not Present- Anxiety, Bipolar, Change in Sleep Pattern, Depression, Fearful and Frequent crying. Endocrine Not Present- Cold Intolerance, Excessive Hunger, Hair Changes, Heat Intolerance, Hot flashes and New Diabetes. Hematology Not Present- Easy Bruising, Excessive bleeding, Gland problems, HIV and Persistent Infections.   Vitals Elbert Ewings CMA; 02/22/2015 10:37 AM) 02/22/2015 10:36 AM Weight: 219 lb Height:  67in Body Surface Area: 2.17 m Body Mass Index: 34.3 kg/m Temp.: 98.25F(Oral)  Pulse: 77 (Regular)  Resp.: 18 (Unlabored)  BP: 142/78 (Sitting, Left Arm, Standard)    Physical Exam Adin Hector MD; 02/22/2015 11:06 AM) General Mental Status-Alert. General Appearance-Not in acute distress, Not Sickly. Orientation-Oriented X3. Hydration-Well hydrated. Voice-Normal.  Integumentary Global  Assessment Upon inspection and palpation of skin surfaces of the - Axillae: non-tender, no inflammation or ulceration, no drainage. and Distribution of scalp and body hair is normal. General Characteristics Temperature - normal warmth is noted.  Head and Neck Head-normocephalic, atraumatic with no lesions or palpable masses. Face Global Assessment - atraumatic, no absence of expression. Neck Global Assessment - no abnormal movements, no bruit auscultated on the right, no bruit auscultated on the left, no decreased range of motion, non-tender. Trachea-midline. Thyroid Gland Characteristics - non-tender.  Eye Eyeball - Left-Extraocular movements intact, No Nystagmus. Eyeball - Right-Extraocular movements intact, No Nystagmus. Cornea - Left-No Hazy. Cornea - Right-No Hazy. Sclera/Conjunctiva - Left-No scleral icterus, No Discharge. Sclera/Conjunctiva - Right-No scleral icterus, No Discharge. Pupil - Left-Direct reaction to light normal. Pupil - Right-Direct reaction to light normal.  ENMT Ears Pinna - Left - no drainage observed, no generalized tenderness observed. Right - no drainage observed, no generalized tenderness observed. Nose and Sinuses External Inspection of the Nose - no destructive lesion observed. Inspection of the nares - Left - quiet respiration. Right - quiet respiration. Mouth and Throat Lips - Upper Lip - no fissures observed, no pallor noted. Lower Lip - no fissures observed, no pallor noted. Nasopharynx - no discharge  present. Oral Cavity/Oropharynx - Tongue - no dryness observed. Oral Mucosa - no cyanosis observed. Hypopharynx - no evidence of airway distress observed.  Chest and Lung Exam Inspection Movements - Normal and Symmetrical. Accessory muscles - No use of accessory muscles in breathing. Palpation Palpation of the chest reveals - Non-tender. Auscultation Breath sounds - Normal and Clear.  Cardiovascular Auscultation Rhythm - Regular. Murmurs & Other Heart Sounds - Auscultation of the heart reveals - No Murmurs and No Systolic Clicks.  Abdomen Inspection Inspection of the abdomen reveals - No Visible peristalsis and No Abnormal pulsations. Umbilicus - No Bleeding, No Urine drainage. Palpation/Percussion Palpation and Percussion of the abdomen reveal - Soft, Non Tender, No Rebound tenderness, No Rigidity (guarding) and No Cutaneous hyperesthesia. Note: Obese but soft. No diastases or umbilical hernia.   Male Genitourinary Sexual Maturity Tanner 5 - Adult hair pattern and Adult penile size and shape. Note: Normal external genitalia. No inguinal hernias.   Rectal Note: Perianal skin clean. Small tag on buttock. Normal sphincter tone. Fullness & edge in rectal folds at tip of finger LEFT anterior aspect suspicious of area of polypectomy. At least 8 cm from anal verge. Posterior rectal wall soft. Some prolapsing down but no true proccidentia. Prostate smooth with no masses.   Peripheral Vascular Upper Extremity Inspection - Left - No Cyanotic nailbeds, Not Ischemic. Right - No Cyanotic nailbeds, Not Ischemic.  Neurologic Neurologic evaluation reveals -normal attention span and ability to concentrate, able to name objects and repeat phrases. Appropriate fund of knowledge , normal sensation and normal coordination. Mental Status Affect - not angry, not paranoid. Cranial Nerves-Normal Bilaterally. Gait-Normal.  Neuropsychiatric Mental status exam performed with findings  of-able to articulate well with normal speech/language, rate, volume and coherence, thought content normal with ability to perform basic computations and apply abstract reasoning and no evidence of hallucinations, delusions, obsessions or homicidal/suicidal ideation.  Musculoskeletal Global Assessment Spine, Ribs and Pelvis - no instability, subluxation or laxity. Right Upper Extremity - no instability, subluxation or laxity.  Lymphatic Head & Neck  General Head & Neck Lymphatics: Bilateral - Description - No Localized lymphadenopathy. Axillary  General Axillary Region: Bilateral - Description - No Localized lymphadenopathy. Femoral & Inguinal  Generalized Femoral &  Inguinal Lymphatics: Left - Description - No Localized lymphadenopathy. Right - Description - No Localized lymphadenopathy.    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 No major lymphovascular invasion but extensive high-grade dysplasia. Most aggressive option is to proceed with low anterior resection for best margins. However, if this is truly superficial and incidental and no suspicion on endoscopic ultrasound, could proceed with TEM partial proctectomy for better margins or follow closely. The patient is inclined to avoid a bigger operation of possible but wishes to think about things first.  The patient was discussed at GI tumor Board.  I was able discuss with Dr. Donato Heinz with pathology. Fortunately specimen and numerous aggregates so hard to get a good level of invasion. Because it seems superficial and without LVI, most likely early sm1.  Recollection at tumor board was watchful waiting may be the most appropriate. However patient could benefit with better margins by TEM partial proctectomy at the least.  Will discuss with partners.  I asked the patient to call once the endorectal ultrasound is  complete. Current Plans  Pt Education - [facility] Free Text Education/Instructions: discussed with patient and provided information. Pt Education - Colon and Rectal Cancer *: rectal cancer Pt Education - [facility] TEM Education (Mekiyah Gladwell): discussed with patient and provided information. Pt Education - [facility] Good Bowel Health (Kidus Delman) Biopsy repeat atypical - plan TEM for better resection  Adin Hector, M.D., F.A.C.S. Gastrointestinal and Minimally Invasive Surgery [facility] [address], [place], [place] [address] [phone] Main / Paging

## 2015-04-08 NOTE — Transfer of Care (Signed)
Immediate Anesthesia Transfer of Care Note  Patient: Jake Gross  Procedure(s) Performed: Procedure(s): TEM PARTIAL PROCTECTOMY OF RECTAL MASS (N/A)  Patient Location: PACU  Anesthesia Type:General  Level of Consciousness: awake, oriented and patient cooperative  Airway & Oxygen Therapy: Patient Spontanous Breathing and Patient connected to face mask oxygen  Post-op Assessment: Report given to RN and Post -op Vital signs reviewed and stable  Post vital signs: Reviewed and stable  Last Vitals:  Filed Vitals:   04/08/15 0513  BP: 145/72  Pulse: 70  Temp: 36.3 C  Resp: 18    Complications: No apparent anesthesia complications

## 2015-04-08 NOTE — Interval H&P Note (Signed)
History and Physical Interval Note:  04/08/2015 7:23 AM  Jake Gross  has presented today for surgery, with the diagnosis of Rectal Adenocarcinoma  The various methods of treatment have been discussed with the patient and family. After consideration of risks, benefits and other options for treatment, the patient has consented to  Procedure(s): TEM PARTIAL PROCTECTOMY OF RECTAL MASS (N/A) as a surgical intervention .  The patient's history has been reviewed, patient examined, no change in status, stable for surgery.  I have reviewed the patient's chart and labs.  Questions were answered to the patient's satisfaction.     Mayukha Symmonds C.

## 2015-04-09 LAB — CBC
HCT: 38.4 % — ABNORMAL LOW (ref 39.0–52.0)
Hemoglobin: 13 g/dL (ref 13.0–17.0)
MCH: 29.5 pg (ref 26.0–34.0)
MCHC: 33.9 g/dL (ref 30.0–36.0)
MCV: 87.1 fL (ref 78.0–100.0)
PLATELETS: 184 10*3/uL (ref 150–400)
RBC: 4.41 MIL/uL (ref 4.22–5.81)
RDW: 15 % (ref 11.5–15.5)
WBC: 12.5 10*3/uL — ABNORMAL HIGH (ref 4.0–10.5)

## 2015-04-09 LAB — GLUCOSE, CAPILLARY
GLUCOSE-CAPILLARY: 120 mg/dL — AB (ref 65–99)
GLUCOSE-CAPILLARY: 176 mg/dL — AB (ref 65–99)
Glucose-Capillary: 89 mg/dL (ref 65–99)
Glucose-Capillary: 120 mg/dL — ABNORMAL HIGH (ref 65–99)
Glucose-Capillary: 181 mg/dL — ABNORMAL HIGH (ref 65–99)
Glucose-Capillary: 143 mg/dL — ABNORMAL HIGH (ref 65–99)

## 2015-04-09 LAB — BASIC METABOLIC PANEL
Anion gap: 8 (ref 5–15)
BUN: 26 mg/dL — ABNORMAL HIGH (ref 6–20)
CALCIUM: 8.3 mg/dL — AB (ref 8.9–10.3)
CO2: 26 mmol/L (ref 22–32)
Chloride: 106 mmol/L (ref 101–111)
Creatinine, Ser: 1.41 mg/dL — ABNORMAL HIGH (ref 0.61–1.24)
GFR calc non Af Amer: 48 mL/min — ABNORMAL LOW (ref >60)
GFR, EST AFRICAN AMERICAN: 56 mL/min — AB (ref >60)
Glucose, Bld: 84 mg/dL (ref 65–99)
Potassium: 3.7 mmol/L (ref 3.5–5.1)
SODIUM: 140 mmol/L (ref 135–145)

## 2015-04-09 LAB — MAGNESIUM: MAGNESIUM: 1.8 mg/dL (ref 1.7–2.4)

## 2015-04-09 MED ORDER — DEXTROSE 5 % IV SOLN
1000.0000 mg | Freq: Four times a day (QID) | INTRAVENOUS | Status: DC | PRN
  Administered 2015-04-09: 1000 mg via INTRAVENOUS
  Filled 2015-04-09 (×3): qty 10

## 2015-04-09 MED ORDER — METHOCARBAMOL 500 MG PO TABS: 1000.0000 mg | ORAL_TABLET | Freq: Four times a day (QID) | ORAL | Status: DC | PRN

## 2015-04-09 MED ORDER — INSULIN GLARGINE 100 UNIT/ML ~~LOC~~ SOLN
20.0000 [IU] | Freq: Two times a day (BID) | SUBCUTANEOUS | Status: DC
  Administered 2015-04-09 (×2): 20 [IU] via SUBCUTANEOUS
  Filled 2015-04-09 (×3): qty 0.2

## 2015-04-09 MED ORDER — LACTATED RINGERS IV BOLUS (SEPSIS)
1000.0000 mL | Freq: Once | INTRAVENOUS | Status: AC
Start: 2015-04-09 — End: 2015-04-09
  Administered 2015-04-09: 1000 mL via INTRAVENOUS

## 2015-04-09 MED ORDER — MORPHINE SULFATE 4 MG/ML IJ SOLN
INTRAMUSCULAR | Status: AC
  Administered 2015-04-09: 4 mg
  Filled 2015-04-09: qty 1

## 2015-04-09 MED ORDER — LACTATED RINGERS IV SOLN
INTRAVENOUS | Status: DC
  Administered 2015-04-09: 11:00:00 via INTRAVENOUS

## 2015-04-09 MED ORDER — MORPHINE SULFATE 2 MG/ML IJ SOLN
2.0000 mg | INTRAMUSCULAR | Status: DC | PRN
  Administered 2015-04-09: 4 mg via INTRAVENOUS

## 2015-04-09 MED ORDER — METOPROLOL TARTRATE 25 MG PO TABS
25.0000 mg | ORAL_TABLET | Freq: Two times a day (BID) | ORAL | Status: DC
  Administered 2015-04-09 – 2015-04-10 (×3): 25 mg via ORAL
  Filled 2015-04-09 (×4): qty 1

## 2015-04-09 NOTE — Progress Notes (Signed)
Pt had emesis. He thinks the oxycodone make him sick. In bathroom now sitting on toilet. Will continue to monitor.

## 2015-04-09 NOTE — Progress Notes (Signed)
Patient in and out cath for 1200cc clear urine after bladder scanned for 1000.

## 2015-04-09 NOTE — Progress Notes (Signed)
[facility]  [address] [place], [place] [address] Phone: [phone] FAX: [phone]   [employer] [phone] [birth year]   Problem List:   Active Problems:   Rectal cancer   1 Day Post-Op  Procedure(s): TEM PARTIAL PROCTECTOMY OF RECTAL MASS  Assessment  - Guarded: Severe sudden right-sided flank pain - no abd pain, no peritonitis, VSS. ?MS strain  Plan:  - Dilaudid/oxycodone for right-sided-flank pain.  Add ice/heat.  Tylenol RTC - ?new bed - sips only - IVF bolus - follow Cr - inc BP control - DM control - VTE prophylaxis - SCDs, etc. - reevaluate if worse. - Mobilize as tolerated to help recovery.  D/C patient from hospital when patient meets criteria (anticipate in 1-2 day(s)):  Tolerating oral intake well Ambulating well Adequate pain control without IV medications Urinating  Having flatus Disposition planning in place   Adin Hector, M.D., F.A.C.S. Gastrointestinal and Minimally Invasive Surgery [facility] [address] [place], [place] [address] [phone] Main / Paging    04/09/2015  Subjective:  Sudden sharp R flank pain 30 min ago w twisting "I don't like this bed" Tol PO - ate dinner w/o problems  Denies pain w cough Urinating better today   Objective:  Vital signs:  Filed Vitals:   04/08/15 2143 04/09/15 0147 04/09/15 0454 04/09/15 0533  BP: 107/73 123/65  161/74  Pulse: 88 68  82  Temp: 97.9 F (36.6 C) 98.3 F (36.8 C)  97.7 F (36.5 C)  TempSrc: Oral Oral  Oral  Resp: '16 16  16  ' Height:      Weight:   102.6 kg (226 lb 3.1 oz)   SpO2: 99% 99%  100%    Last BM Date: 04/09/15  Intake/Output   Yesterday:  06/23 0701 - 06/24 0700 In: 4683.8 [P.O.:1080; I.V.:3553.8; IV Piggyback:50] Out: 1250 [CXKGY:1856; Blood:75] This shift:     Bowel function:  Flatus: y  BM: soft  Drain: n/a  Physical Exam:  General: Pt  awake/alert/oriented x4 in mild acute distress Eyes: PERRL, normal EOM.  Sclera clear.  No icterus Neuro: CN II-XII intact w/o focal sensory/motor deficits. Lymph: No head/neck/groin lymphadenopathy Psych:  No delerium/psychosis/paranoia HENT: Normocephalic, Mucus membranes moist.  No thrush Neck: Supple, No tracheal deviation Chest: No chest wall pain w good excursion CV:  Pulses intact.  Regular rhythm MS: Normal AROM mjr joints.  No obvious deformity.  Mod TTP right upper posterior flank - no guarding/RT Abdomen: Soft but morbidly obese.  Nondistended.  Nontender -  No evidence of peritonitis.  No incarcerated hernias. Ext:  SCDs BLE.  No mjr edema.  No cyanosis Skin: No petechiae / purpura  Results:   Labs: Results for orders placed or performed during the hospital encounter of 04/08/15 (from the past 48 hour(s))  Glucose, capillary     Status: Abnormal   Collection Time: 04/08/15  5:15 AM  Result Value Ref Range   Glucose-Capillary 103 (H) 65 - 99 mg/dL   Comment 1 Notify RN   Glucose, capillary     Status: Abnormal   Collection Time: 04/08/15 10:49 AM  Result Value Ref Range   Glucose-Capillary 187 (H) 65 - 99 mg/dL   Comment 1 Notify RN    Comment 2 Document in Chart   Glucose, capillary     Status: Abnormal   Collection Time: 04/08/15  1:05 PM  Result Value Ref Range   Glucose-Capillary 247 (H) 65 -  99 mg/dL  Glucose, capillary     Status: Abnormal   Collection Time: 04/08/15  4:17 PM  Result Value Ref Range   Glucose-Capillary 242 (H) 65 - 99 mg/dL  Glucose, capillary     Status: Abnormal   Collection Time: 04/08/15  7:44 PM  Result Value Ref Range   Glucose-Capillary 257 (H) 65 - 99 mg/dL  Glucose, capillary     Status: Abnormal   Collection Time: 04/08/15  9:51 PM  Result Value Ref Range   Glucose-Capillary 250 (H) 65 - 99 mg/dL  Basic metabolic panel     Status: Abnormal   Collection Time: 04/09/15  4:05 AM  Result Value Ref Range   Sodium 140 135 - 145  mmol/L   Potassium 3.7 3.5 - 5.1 mmol/L   Chloride 106 101 - 111 mmol/L   CO2 26 22 - 32 mmol/L   Glucose, Bld 84 65 - 99 mg/dL   BUN 26 (H) 6 - 20 mg/dL   Creatinine, Ser 1.41 (H) 0.61 - 1.24 mg/dL   Calcium 8.3 (L) 8.9 - 10.3 mg/dL   GFR calc non Af Amer 48 (L) >60 mL/min   GFR calc Af Amer 56 (L) >60 mL/min    Comment: (NOTE) The eGFR has been calculated using the CKD EPI equation. This calculation has not been validated in all clinical situations. eGFR's persistently <60 mL/min signify possible Chronic Kidney Disease.    Anion gap 8 5 - 15  CBC     Status: Abnormal   Collection Time: 04/09/15  4:05 AM  Result Value Ref Range   WBC 12.5 (H) 4.0 - 10.5 K/uL   RBC 4.41 4.22 - 5.81 MIL/uL   Hemoglobin 13.0 13.0 - 17.0 g/dL   HCT 38.4 (L) 39.0 - 52.0 %   MCV 87.1 78.0 - 100.0 fL   MCH 29.5 26.0 - 34.0 pg   MCHC 33.9 30.0 - 36.0 g/dL   RDW 15.0 11.5 - 15.5 %   Platelets 184 150 - 400 K/uL  Magnesium     Status: None   Collection Time: 04/09/15  4:05 AM  Result Value Ref Range   Magnesium 1.8 1.7 - 2.4 mg/dL    Imaging / Studies: No results found.  Medications / Allergies: per chart  Antibiotics: Anti-infectives    Start     Dose/Rate Route Frequency Ordered Stop   04/08/15 1800  cefoTEtan (CEFOTAN) 2 g in dextrose 5 % 50 mL IVPB     2 g 100 mL/hr over 30 Minutes Intravenous Every 12 hours 04/08/15 1216 04/08/15 1806   04/08/15 0513  cefoTEtan (CEFOTAN) 2 g in dextrose 5 % 50 mL IVPB     2 g 100 mL/hr over 30 Minutes Intravenous On call to O.R. 04/08/15 0513 04/08/15 0730        Note: Portions of this report may have been transcribed using voice recognition software. Every effort was made to ensure accuracy; however, inadvertent computerized transcription errors may be present.   Any transcriptional errors that result from this process are unintentional.     '@SCGSIGNATURE' @  04/09/2015  CARE TEAM:  PCP: Delia Chimes, NP  Outpatient Care Team: Patient  Care Team: Delia Chimes, NP as PCP - General (Nurse Practitioner) Inda Castle, MD as Consulting Physician (Gastroenterology) Michael Boston, MD as Consulting Physician (General Surgery) Gavin Pound, MD as Consulting Physician (Rheumatology)  Inpatient Treatment Team: Treatment Team: Attending Provider: Michael Boston, MD; Registered Nurse: Bailey Mech, RN

## 2015-04-09 NOTE — Progress Notes (Signed)
Dr Johney Maine paged regarding pt patient status, lantus dose, and pain.  Await response.  Above discussed with MD orders received at this time.

## 2015-04-10 LAB — BASIC METABOLIC PANEL
Anion gap: 8 (ref 5–15)
BUN: 30 mg/dL — ABNORMAL HIGH (ref 6–20)
CALCIUM: 8.3 mg/dL — AB (ref 8.9–10.3)
CO2: 26 mmol/L (ref 22–32)
Chloride: 103 mmol/L (ref 101–111)
Creatinine, Ser: 1.48 mg/dL — ABNORMAL HIGH (ref 0.61–1.24)
GFR calc Af Amer: 53 mL/min — ABNORMAL LOW (ref >60)
GFR, EST NON AFRICAN AMERICAN: 46 mL/min — AB (ref >60)
GLUCOSE: 89 mg/dL (ref 65–99)
Potassium: 4.4 mmol/L (ref 3.5–5.1)
SODIUM: 137 mmol/L (ref 135–145)

## 2015-04-10 LAB — CBC
HEMATOCRIT: 39.6 % (ref 39.0–52.0)
Hemoglobin: 12.8 g/dL — ABNORMAL LOW (ref 13.0–17.0)
MCH: 28.6 pg (ref 26.0–34.0)
MCHC: 32.3 g/dL (ref 30.0–36.0)
MCV: 88.4 fL (ref 78.0–100.0)
Platelets: 176 10*3/uL (ref 150–400)
RBC: 4.48 MIL/uL (ref 4.22–5.81)
RDW: 15.4 % (ref 11.5–15.5)
WBC: 11.9 10*3/uL — AB (ref 4.0–10.5)

## 2015-04-10 LAB — GLUCOSE, CAPILLARY: Glucose-Capillary: 78 mg/dL (ref 65–99)

## 2015-04-10 NOTE — Discharge Summary (Addendum)
Patient ID: Jake Gross, male   DOB: 1941/09/21, 74 y.o.   MRN: [id number]  Physician Discharge Summary  Patient ID: Jake Gross MRN: [id number] DOB/AGE: 1941-03-29 74 y.o.  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  Postoperatively, the patient gradually mobilized and advanced to a solid diet.  Pain and other symptoms were treated aggressively.  He did develop sharp right-sided flank pain the morning of postoperative day one.  Felt much better after in and out catheterization.  Declined Foley at the time.    By the time of discharge, the patient was walking well the hallways, eating food, having flatus.  Pain was well-controlled on an oral medications.  Based on meeting discharge criteria and continuing to recover, I felt it was safe for the patient to be discharged from the hospital to further recover with close followup. Postoperative recommendations were discussed in detail.  They are written as well.   Significant Diagnostic Studies:  No results found for this or any previous visit (from the past 72 hour(s)).  No results found.  Discharge Exam: Blood pressure  153/75, pulse 79, temperature 98.1 F (36.7 C), temperature source Oral, resp. rate 18, height 5\' 7"  (1.702 m), weight 92.942 kg (204 lb 14.4 oz), SpO2 94 %.  General: Pt awake/alert/oriented x4 in no major acute distress Eyes: PERRL, normal EOM. Sclera nonicteric Neuro: CN II-XII intact w/o focal sensory/motor deficits. Lymph: No head/neck/groin lymphadenopathy Psych:  No delerium/psychosis/paranoia HENT: Normocephalic, Mucus membranes moist.  No thrush Neck: Supple, No tracheal deviation Chest: No pain.  Good respiratory excursion. CV:  Pulses intact.  Regular rhythm MS: Normal AROM mjr joints.  No obvious deformity Abdomen: Soft, Nondistended.  Nontender.  No incarcerated hernias. Ext:  SCDs BLE.  No significant edema.  No cyanosis Skin: No petechiae / purpura  Discharged Condition: good   Past Medical History  Diagnosis Date  . Hypertension   . Diabetes mellitus   . Hyperlipidemia   . Arthritis   . Vitamin D deficiency   . Adenocarcinoma in a polyp     Past Surgical History  Procedure Laterality Date  . Cardiovascular stress test  10/12/1999    EF 63%. NO ISCHEMIA  . Flexible sigmoidoscopy N/A 02/02/2015    Procedure: FLEXIBLE SIGMOIDOSCOPY;  Surgeon: Inda Castle, MD;  Location: [facility];  Service: Endoscopy;  Laterality: N/A;  ERBE  . Eus N/A 03/11/2015    Procedure: LOWER ENDOSCOPIC ULTRASOUND (EUS);  Surgeon: Milus Banister, MD;  Location: [facility];  Service: Endoscopy;  Laterality: N/A;  . Tonsillectomy and adenoidectomy      as child  . Abcess drainage Left     buttocks  . Colonoscopy w/ polypectomy      5 polyps  . Partial proctectomy by tem N/A 04/08/2015    Procedure:  TEM PARTIAL PROCTECTOMY OF RECTAL MASS;  Surgeon: Michael Boston, MD;  Location: [facility];  Service: General;  Laterality: N/A;    History   Social History  . Marital Status: Divorced    Spouse Name: N/A  . Number of Children: 2  . Years of Education: N/A   Occupational History   . 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 Smoking status: Former Smoker    Types: Cigarettes    Quit date: 08/13/1994  . Smokeless tobacco: Never Used  . Alcohol Use: No  . Drug Use: No  . Sexual Activity: Not on file   Other Topics Concern  . Not on file   Social History Narrative    Family History  Problem Relation Age of Onset  . Cancer Father     "all over"  . Coronary artery disease Brother   . Diabetes Brother   . Stroke Mother   . Diabetes Mother   . Colon cancer Neg Hx   . Esophageal cancer Neg Hx   . Rectal cancer Neg Hx   . Stomach cancer Neg Hx     No current facility-administered medications for this encounter.   Current Outpatient Prescriptions  Medication Sig Dispense Refill  . aspirin 81 MG tablet Take 162 mg by mouth at bedtime.     . folic acid (FOLVITE) 1 MG tablet Take 1 mg by mouth every morning.     . insulin glargine (LANTUS) 100 UNIT/ML injection Inject 75 Units into the skin 2 (two) times daily.     . insulin lispro (HUMALOG KWIKPEN) 100 UNIT/ML KiwkPen Inject 2-6 Units into the skin 3 (three) times daily as needed (high blood sugar). Sliding scale    . losartan-hydrochlorothiazide (HYZAAR) 100-25 MG per tablet TAKE 1 TABLET DAILY (PATIENT NEEDS TO CALL OFFICE TO SCHEDULE FOLLOW UP APPOINTMENT) (Patient taking differently: take one tablet by  mouth every morning.) 90 tablet 0  . methotrexate (RHEUMATREX) 2.5 MG tablet Take 20 mg by mouth every Friday.    . metoprolol succinate (TOPROL-XL) 50 MG 24 hr tablet Take 1 tablet (50 mg total) by mouth daily. Take with or immediately following a meal. 90 tablet 3  . rosuvastatin (CRESTOR) 20 MG tablet Take 20 mg by mouth at bedtime.     Marland Kitchen oxyCODONE (OXY IR/ROXICODONE) 5 MG immediate release tablet Take 1-2 tablets (5-10 mg total) by mouth every 6 (six) hours as needed for moderate pain, severe pain or breakthrough pain. 30 tablet 0     No Known Allergies  Disposition: 01-Home or Self  Care  Discharge Instructions    Call MD for:  extreme fatigue    Complete by:  As directed      Call MD for:  hives    Complete by:  As directed      Call MD for:  persistant nausea and vomiting    Complete by:  As directed      Call MD for:  redness, tenderness, or signs of infection (pain, swelling, redness, odor or green/yellow discharge around incision site)    Complete by:  As directed      Call MD for:  severe uncontrolled pain    Complete by:  As directed      Call MD for:    Complete by:  As directed   Temperature > 101.16F     Diet - low sodium heart healthy    Complete by:  As directed      Discharge  instructions    Complete by:  As directed   Please see discharge instruction sheets.  Also refer to handout given an office.  Please call our office if you have any questions or concerns [phone]     Discharge wound care:    Complete by:  As directed   If you have closed incisions, shower and bathe over these incisions with soap and water every day.  Remove all surgical dressings on postoperative day #3.  You do not need to replace dressings over the closed incisions unless you feel more comfortable with a Band-Aid covering it.   If you have an open wound that requires packing, please see wound care instructions.  In general, remove all dressings, wash wound with soap and water and then replace with saline moistened gauze.  Do the dressing change at least every day.  Please call our office [phone] if you have further questions.     Driving Restrictions    Complete by:  As directed   No driving until off narcotics and can safely swerve away without pain during an emergency     Increase activity slowly    Complete by:  As directed   Walk an hour a day.  Use 20-30 minute walks.  When you can walk 30 minutes without difficulty, increase to low impact/moderate activities such as biking, jogging, swimming, sexual activity..  Eventually can increase to unrestricted activity  when not feeling pain.  If you feel pain: STOP!Marland Kitchen   Let pain protect you from overdoing it.  Use ice/heat/over-the-counter pain medications to help minimize his soreness.  Use pain prescriptions as needed to remain active.  It is better to take extra pain medications and be more active than to stay bedridden to avoid all pain medications.     Lifting restrictions    Complete by:  As directed   Avoid heavy lifting initially.  Do not push through pain.  You have no specific weight limit.  Coughing and sneezing or four more stressful to your incision than any lifting you will do. Pain will protect you from injury.  Therefore, avoid intense activity until off all narcotic pain medications.  Coughing and sneezing or four more stressful to your incision than any lifting he will do.     May shower / Bathe    Complete by:  As directed      May walk up steps    Complete by:  As directed      Sexual Activity Restrictions    Complete by:  As directed   Sexual activity as tolerated.  Do not push through pain.  Pain will protect you from injury.     Walk with assistance    Complete by:  As directed   Walk over an hour a day.  May use a walker/cane/companion to help with balance and stamina.            Medication List    TAKE these medications        aspirin 81 MG tablet  Take 162 mg by mouth at bedtime.     folic acid 1 MG tablet  Commonly known as:  FOLVITE  Take 1 mg by mouth every morning.     HUMALOG KWIKPEN 100 UNIT/ML KiwkPen  Generic drug:  insulin lispro  Inject 2-6 Units into the skin 3 (three) times daily as needed (high blood sugar). Sliding scale     insulin glargine 100 UNIT/ML injection  Commonly known as:  LANTUS  Inject 75 Units into the skin 2 (two) times daily.     losartan-hydrochlorothiazide 100-25 MG per tablet  Commonly known as:  HYZAAR  TAKE 1 TABLET DAILY (PATIENT NEEDS TO CALL OFFICE TO SCHEDULE FOLLOW UP APPOINTMENT)     methotrexate 2.5 MG tablet  Commonly  known as:  RHEUMATREX  Take 20 mg by mouth every Friday.     metoprolol succinate 50 MG 24 hr tablet  Commonly known as:  TOPROL-XL  Take 1 tablet (50 mg total) by mouth daily. Take with or immediately following a meal.     oxyCODONE 5 MG immediate release tablet  Commonly known as:  Oxy IR/ROXICODONE  Take 1-2 tablets (5-10 mg total) by mouth every 6 (six) hours as needed for moderate pain, severe pain or breakthrough pain.     rosuvastatin 20 MG tablet  Commonly known as:  CRESTOR  Take 20 mg by mouth at bedtime.           Follow-up Information    Follow up with Drevon Plog C., MD In 2 weeks.   Specialty:  General Surgery   Why:  To follow up after your operation, To follow up after your hospital stay   Contact information:   [place] [address] [phone]        Signed: Morton Peters, M.D., F.A.C.S. Gastrointestinal and Minimally Invasive Surgery [facility] [address] [place], [place] [address] [phone] Main / Paging   04/21/2015, 9:07 AM

## 2015-04-10 NOTE — Progress Notes (Signed)
Dr. Zella Richer aware via phone pt's CBG was 74 at 0800 and pt ate little for breakfast. Asymptomatic of hypoglycemia. Expressed "feeling fine" and ready to dc. Lantus 20 Units scheduled yet instructed nurse to hold this am per Dr. Zella Richer.

## 2015-04-10 NOTE — Progress Notes (Signed)
Assessment unchanged. Pt verbalized understanding of dc instructions through teach back. Script x 1 given as provided by MD. Denies pain. Discharged via wc to front entrance to meet brother and awaiting vehicle to carry home. Accompanied by NT.

## 2015-04-11 ENCOUNTER — Inpatient Hospital Stay (HOSPITAL_COMMUNITY)
Admission: EM | Admit: 2015-04-11 | Discharge: 2015-04-14 | DRG: 683 | Disposition: A | Discharge status: Home / Self Care | Payer: Medicare Other | Attending: Surgery | Admitting: Surgery

## 2015-04-11 ENCOUNTER — Encounter (HOSPITAL_COMMUNITY): Payer: Self-pay | Admitting: Emergency Medicine

## 2015-04-11 DIAGNOSIS — C20 Malignant neoplasm of rectum: Secondary | ICD-10-CM | POA: Diagnosis present

## 2015-04-11 DIAGNOSIS — R339 Retention of urine, unspecified: Secondary | ICD-10-CM | POA: Diagnosis present

## 2015-04-11 DIAGNOSIS — I10 Essential (primary) hypertension: Secondary | ICD-10-CM | POA: Diagnosis not present

## 2015-04-11 DIAGNOSIS — E860 Dehydration: Secondary | ICD-10-CM | POA: Diagnosis not present

## 2015-04-11 DIAGNOSIS — R338 Other retention of urine: Secondary | ICD-10-CM

## 2015-04-11 DIAGNOSIS — N39490 Overflow incontinence: Secondary | ICD-10-CM | POA: Diagnosis present

## 2015-04-11 DIAGNOSIS — E785 Hyperlipidemia, unspecified: Secondary | ICD-10-CM | POA: Diagnosis not present

## 2015-04-11 DIAGNOSIS — C801 Malignant (primary) neoplasm, unspecified: Secondary | ICD-10-CM | POA: Diagnosis present

## 2015-04-11 DIAGNOSIS — Z823 Family history of stroke: Secondary | ICD-10-CM

## 2015-04-11 DIAGNOSIS — M1990 Unspecified osteoarthritis, unspecified site: Secondary | ICD-10-CM | POA: Diagnosis present

## 2015-04-11 DIAGNOSIS — Z87891 Personal history of nicotine dependence: Secondary | ICD-10-CM

## 2015-04-11 DIAGNOSIS — Z7982 Long term (current) use of aspirin: Secondary | ICD-10-CM

## 2015-04-11 DIAGNOSIS — Z8249 Family history of ischemic heart disease and other diseases of the circulatory system: Secondary | ICD-10-CM

## 2015-04-11 DIAGNOSIS — Z79899 Other long term (current) drug therapy: Secondary | ICD-10-CM

## 2015-04-11 DIAGNOSIS — E119 Type 2 diabetes mellitus without complications: Secondary | ICD-10-CM

## 2015-04-11 DIAGNOSIS — R197 Diarrhea, unspecified: Secondary | ICD-10-CM | POA: Diagnosis present

## 2015-04-11 DIAGNOSIS — Z809 Family history of malignant neoplasm, unspecified: Secondary | ICD-10-CM

## 2015-04-11 DIAGNOSIS — N179 Acute kidney failure, unspecified: Principal | ICD-10-CM

## 2015-04-11 DIAGNOSIS — Z833 Family history of diabetes mellitus: Secondary | ICD-10-CM

## 2015-04-11 DIAGNOSIS — Z794 Long term (current) use of insulin: Secondary | ICD-10-CM

## 2015-04-11 LAB — COMPREHENSIVE METABOLIC PANEL
ALT: 16 U/L — ABNORMAL LOW (ref 17–63)
ANION GAP: 13 (ref 5–15)
AST: 23 U/L (ref 15–41)
Albumin: 2.9 g/dL — ABNORMAL LOW (ref 3.5–5.0)
Alkaline Phosphatase: 69 U/L (ref 38–126)
BUN: 36 mg/dL — ABNORMAL HIGH (ref 6–20)
CO2: 22 mmol/L (ref 22–32)
CREATININE: 1.72 mg/dL — AB (ref 0.61–1.24)
Calcium: 8.7 mg/dL — ABNORMAL LOW (ref 8.9–10.3)
Chloride: 104 mmol/L (ref 101–111)
GFR, EST AFRICAN AMERICAN: 44 mL/min — AB (ref >60)
GFR, EST NON AFRICAN AMERICAN: 38 mL/min — AB (ref >60)
Glucose, Bld: 243 mg/dL — ABNORMAL HIGH (ref 65–99)
Potassium: 3.9 mmol/L (ref 3.5–5.1)
SODIUM: 139 mmol/L (ref 135–145)
Total Bilirubin: 1.3 mg/dL — ABNORMAL HIGH (ref 0.3–1.2)
Total Protein: 6.7 g/dL (ref 6.5–8.1)

## 2015-04-11 LAB — CBC WITH DIFFERENTIAL/PLATELET
BASOS ABS: 0 10*3/uL (ref 0.0–0.1)
Basophils Relative: 0 % (ref 0–1)
Eosinophils Absolute: 0 10*3/uL (ref 0.0–0.7)
Eosinophils Relative: 0 % (ref 0–5)
HCT: 42.3 % (ref 39.0–52.0)
Hemoglobin: 14.4 g/dL (ref 13.0–17.0)
LYMPHS ABS: 1.3 10*3/uL (ref 0.7–4.0)
LYMPHS PCT: 9 % — AB (ref 12–46)
MCH: 29.6 pg (ref 26.0–34.0)
MCHC: 34 g/dL (ref 30.0–36.0)
MCV: 86.9 fL (ref 78.0–100.0)
MONO ABS: 1.5 10*3/uL — AB (ref 0.1–1.0)
Monocytes Relative: 10 % (ref 3–12)
NEUTROS PCT: 81 % — AB (ref 43–77)
Neutro Abs: 12 10*3/uL — ABNORMAL HIGH (ref 1.7–7.7)
Platelets: 274 10*3/uL (ref 150–400)
RBC: 4.87 MIL/uL (ref 4.22–5.81)
RDW: 14.9 % (ref 11.5–15.5)
WBC: 14.9 10*3/uL — ABNORMAL HIGH (ref 4.0–10.5)

## 2015-04-11 LAB — URINALYSIS, ROUTINE W REFLEX MICROSCOPIC
BILIRUBIN URINE: NEGATIVE
Glucose, UA: 100 mg/dL — AB
KETONES UR: NEGATIVE mg/dL
Leukocytes, UA: NEGATIVE
Nitrite: NEGATIVE
Protein, ur: >300 mg/dL — AB
Specific Gravity, Urine: 1.014 (ref 1.005–1.030)
Urobilinogen, UA: 0.2 mg/dL (ref 0.0–1.0)
pH: 5.5 (ref 5.0–8.0)

## 2015-04-11 LAB — URINE MICROSCOPIC-ADD ON

## 2015-04-11 LAB — CBG MONITORING, ED: Glucose-Capillary: 211 mg/dL — ABNORMAL HIGH (ref 65–99)

## 2015-04-11 LAB — GLUCOSE, CAPILLARY: Glucose-Capillary: 187 mg/dL — ABNORMAL HIGH (ref 65–99)

## 2015-04-11 LAB — I-STAT CG4 LACTIC ACID, ED: LACTIC ACID, VENOUS: 2.15 mmol/L — AB (ref 0.5–2.0)

## 2015-04-11 MED ORDER — PANTOPRAZOLE SODIUM 40 MG IV SOLR
40.0000 mg | Freq: Every day | INTRAVENOUS | Status: DC
  Administered 2015-04-11 – 2015-04-13 (×3): 40 mg via INTRAVENOUS
  Filled 2015-04-11 (×4): qty 40

## 2015-04-11 MED ORDER — METOPROLOL SUCCINATE ER 50 MG PO TB24
50.0000 mg | ORAL_TABLET | Freq: Every day | ORAL | Status: DC
  Administered 2015-04-11: 50 mg via ORAL
  Filled 2015-04-11 (×2): qty 1

## 2015-04-11 MED ORDER — POTASSIUM CHLORIDE IN NACL 20-0.9 MEQ/L-% IV SOLN
INTRAVENOUS | Status: DC
  Administered 2015-04-11 – 2015-04-12 (×2): via INTRAVENOUS
  Administered 2015-04-12: 1000 mL via INTRAVENOUS
  Filled 2015-04-11 (×5): qty 1000

## 2015-04-11 MED ORDER — ONDANSETRON HCL 4 MG/2ML IJ SOLN: 4.0000 mg | Freq: Four times a day (QID) | INTRAMUSCULAR | Status: DC | PRN

## 2015-04-11 MED ORDER — OXYCODONE HCL 5 MG PO TABS: 5.0000 mg | ORAL_TABLET | Freq: Four times a day (QID) | ORAL | Status: DC | PRN

## 2015-04-11 MED ORDER — SODIUM CHLORIDE 0.9 % IV BOLUS (SEPSIS)
1000.0000 mL | Freq: Once | INTRAVENOUS | Status: AC
  Administered 2015-04-11: 1000 mL via INTRAVENOUS

## 2015-04-11 MED ORDER — ENOXAPARIN SODIUM 40 MG/0.4ML ~~LOC~~ SOLN
40.0000 mg | SUBCUTANEOUS | Status: DC
  Administered 2015-04-11 – 2015-04-13 (×3): 40 mg via SUBCUTANEOUS
  Filled 2015-04-11 (×4): qty 0.4

## 2015-04-11 MED ORDER — INSULIN ASPART 100 UNIT/ML ~~LOC~~ SOLN
0.0000 [IU] | SUBCUTANEOUS | Status: DC
  Administered 2015-04-11: 3 [IU] via SUBCUTANEOUS
  Administered 2015-04-12: 2 [IU] via SUBCUTANEOUS
  Administered 2015-04-12: 5 [IU] via SUBCUTANEOUS

## 2015-04-11 NOTE — ED Notes (Signed)
Pt states that he was released yesterday after having a colon surgery for colon CA. Hypotensive. States that he cannot stop urinating and has had diarrhea. Alert and oriented.

## 2015-04-11 NOTE — ED Notes (Signed)
Pt is aware we need a urine sample. Provided Pt with urinal and asked to notify when able to void.

## 2015-04-11 NOTE — ED Notes (Signed)
Bed: WA07 Expected date:  Expected time:  Means of arrival:  Comments: Hold tr 2

## 2015-04-11 NOTE — ED Notes (Signed)
Pt had colon surgery on Thursday by Dr. Johney Maine.  Came in today because he was not feeling well.  Initial BP upon arrival to ED 84/58.  Pt states his urine has been "constant" and he has been having diarrhea.  States "he just doesn't feel well".  Per patient, he was discharged about 1pm yesterday and by the time he got home, he just was already not feeling well.    (recheck temp 97.6)  Per patient, Friday morning before he was discharged he c/o right flank pain and per patient was given a dose of Morphine which made him feel better.  Hospital staff also did an I&O cath and then he was discharge home.

## 2015-04-11 NOTE — H&P (Signed)
Jake Gross is an 74 y.o. male.   Chief Complaint: dehydration HPI: This gentleman was just discharged home from the hospital yesterday POD#2 from a TEM by Dr. Johney Maine.  He has some right flank pain after surgery which was relieved after an in and out catheter. He has been moving his urine well. At discharge yesterday he was doing well. He reports that last evening he had more difficulty with his urine and then developed flank pain again. He reports some incontinence of urine. He has had 2 episodes of diarrhea and several episodes of emesis. He denies any abdominal pain. He feels much better now that a Foley has been inserted.  Past Medical History  Diagnosis Date  . Hypertension   . Diabetes mellitus   . Hyperlipidemia   . Arthritis   . Vitamin D deficiency   . Adenocarcinoma in a polyp     Past Surgical History  Procedure Laterality Date  . Cardiovascular stress test  10/12/1999    EF 63%. NO ISCHEMIA  . Flexible sigmoidoscopy N/A 02/02/2015    Procedure: FLEXIBLE SIGMOIDOSCOPY;  Surgeon: Inda Castle, MD;  Location: [facility];  Service: Endoscopy;  Laterality: N/A;  ERBE  . Eus N/A 03/11/2015    Procedure: LOWER ENDOSCOPIC ULTRASOUND (EUS);  Surgeon: Milus Banister, MD;  Location: [facility];  Service: Endoscopy;  Laterality: N/A;  . Tonsillectomy and adenoidectomy      as child  . Abcess drainage Left     buttocks  . Colonoscopy w/ polypectomy      5 polyps    Family History  Problem Relation Age of Onset  . Cancer Father     "all over"  . Coronary artery disease Brother   . Diabetes Brother   . Stroke Mother   . Diabetes Mother   . Colon cancer Neg Hx   . Esophageal cancer Neg Hx   . Rectal cancer Neg Hx   . Stomach cancer Neg Hx    Social History:  reports that he quit smoking about 20 years ago. His smoking use included Cigarettes. He has never used smokeless tobacco. He reports that he does not drink alcohol or use illicit drugs.  Allergies: No Known  Allergies  Medications Prior to Admission  Medication Sig Dispense Refill  . aspirin 81 MG tablet Take 162 mg by mouth at bedtime.     . folic acid (FOLVITE) 1 MG tablet Take 1 mg by mouth every morning.     Marland Kitchen losartan-hydrochlorothiazide (HYZAAR) 100-25 MG per tablet TAKE 1 TABLET DAILY (PATIENT NEEDS TO CALL OFFICE TO SCHEDULE FOLLOW UP APPOINTMENT) (Patient taking differently: take one tablet by  mouth every morning.) 90 tablet 0  . methotrexate (RHEUMATREX) 2.5 MG tablet Take 20 mg by mouth every Friday.    . metoprolol succinate (TOPROL-XL) 50 MG 24 hr tablet Take 1 tablet (50 mg total) by mouth daily. Take with or immediately following a meal. 90 tablet 3  . rosuvastatin (CRESTOR) 20 MG tablet Take 20 mg by mouth at bedtime.     . insulin glargine (LANTUS) 100 UNIT/ML injection Inject 75 Units into the skin 2 (two) times daily.     . insulin lispro (HUMALOG KWIKPEN) 100 UNIT/ML KiwkPen Inject 2-6 Units into the skin 3 (three) times daily as needed (high blood sugar). Sliding scale    . oxyCODONE (OXY IR/ROXICODONE) 5 MG immediate release tablet Take 1-2 tablets (5-10 mg total) by mouth every 6 (six) hours as needed for moderate pain,  severe pain or breakthrough pain. 30 tablet 0    Results for orders placed or performed during the hospital encounter of 04/11/15 (from the past 48 hour(s))  Urinalysis, Routine w reflex microscopic (not at [facility])     Status: Abnormal   Collection Time: 04/11/15  4:58 PM  Result Value Ref Range   Color, Urine YELLOW YELLOW   APPearance CLEAR CLEAR   Specific Gravity, Urine 1.014 1.005 - 1.030   pH 5.5 5.0 - 8.0   Glucose, UA 100 (A) NEGATIVE mg/dL   Hgb urine dipstick MODERATE (A) NEGATIVE   Bilirubin Urine NEGATIVE NEGATIVE   Ketones, ur NEGATIVE NEGATIVE mg/dL   Protein, ur >300 (A) NEGATIVE mg/dL   Urobilinogen, UA 0.2 0.0 - 1.0 mg/dL   Nitrite NEGATIVE NEGATIVE   Leukocytes, UA NEGATIVE NEGATIVE  Urine microscopic-add on     Status: Abnormal    Collection Time: 04/11/15  4:58 PM  Result Value Ref Range   Squamous Epithelial / LPF RARE RARE   WBC, UA 3-6 <3 WBC/hpf   RBC / HPF 21-50 <3 RBC/hpf   Bacteria, UA FEW (A) RARE  CBG monitoring, ED     Status: Abnormal   Collection Time: 04/11/15  5:24 PM  Result Value Ref Range   Glucose-Capillary 211 (H) 65 - 99 mg/dL  Comprehensive metabolic panel     Status: Abnormal   Collection Time: 04/11/15  5:41 PM  Result Value Ref Range   Sodium 139 135 - 145 mmol/L   Potassium 3.9 3.5 - 5.1 mmol/L   Chloride 104 101 - 111 mmol/L   CO2 22 22 - 32 mmol/L   Glucose, Bld 243 (H) 65 - 99 mg/dL   BUN 36 (H) 6 - 20 mg/dL   Creatinine, Ser 1.72 (H) 0.61 - 1.24 mg/dL   Calcium 8.7 (L) 8.9 - 10.3 mg/dL   Total Protein 6.7 6.5 - 8.1 g/dL   Albumin 2.9 (L) 3.5 - 5.0 g/dL   AST 23 15 - 41 U/L   ALT 16 (L) 17 - 63 U/L   Alkaline Phosphatase 69 38 - 126 U/L   Total Bilirubin 1.3 (H) 0.3 - 1.2 mg/dL   GFR calc non Af Amer 38 (L) >60 mL/min   GFR calc Af Amer 44 (L) >60 mL/min    Comment: (NOTE) The eGFR has been calculated using the CKD EPI equation. This calculation has not been validated in all clinical situations. eGFR's persistently <60 mL/min signify possible Chronic Kidney Disease.    Anion gap 13 5 - 15  CBC with Differential     Status: Abnormal   Collection Time: 04/11/15  5:41 PM  Result Value Ref Range   WBC 14.9 (H) 4.0 - 10.5 K/uL   RBC 4.87 4.22 - 5.81 MIL/uL   Hemoglobin 14.4 13.0 - 17.0 g/dL   HCT 42.3 39.0 - 52.0 %   MCV 86.9 78.0 - 100.0 fL   MCH 29.6 26.0 - 34.0 pg   MCHC 34.0 30.0 - 36.0 g/dL   RDW 14.9 11.5 - 15.5 %   Platelets 274 150 - 400 K/uL   Neutrophils Relative % 81 (H) 43 - 77 %   Neutro Abs 12.0 (H) 1.7 - 7.7 K/uL   Lymphocytes Relative 9 (L) 12 - 46 %   Lymphs Abs 1.3 0.7 - 4.0 K/uL   Monocytes Relative 10 3 - 12 %   Monocytes Absolute 1.5 (H) 0.1 - 1.0 K/uL   Eosinophils Relative 0 0 - 5 %  Eosinophils Absolute 0.0 0.0 - 0.7 K/uL   Basophils  Relative 0 0 - 1 %   Basophils Absolute 0.0 0.0 - 0.1 K/uL  I-Stat CG4 Lactic Acid, ED (Not at [facility] or [facility])     Status: Abnormal   Collection Time: 04/11/15  5:51 PM  Result Value Ref Range   Lactic Acid, Venous 2.15 (HH) 0.5 - 2.0 mmol/L   Comment NOTIFIED PHYSICIAN   Glucose, capillary     Status: Abnormal   Collection Time: 04/11/15  8:01 PM  Result Value Ref Range   Glucose-Capillary 187 (H) 65 - 99 mg/dL   No results found.  Review of Systems  All other systems reviewed and are negative.   Blood pressure 139/77, pulse 66, temperature 98.8 F (37.1 C), temperature source Oral, resp. rate 24, SpO2 95 %. Physical Exam  Constitutional: He is oriented to person, place, and time. He appears well-developed and well-nourished. No distress.  HENT:  Head: Normocephalic and atraumatic.  Eyes: Conjunctivae are normal. Pupils are equal, round, and reactive to light.  Neck: Normal range of motion. No tracheal deviation present.  Cardiovascular: Normal rate, regular rhythm and normal heart sounds.   Respiratory: Effort normal and breath sounds normal. He has no wheezes.  GI: Soft. There is no tenderness.  Neurological: He is alert and oriented to person, place, and time.  Skin: Skin is warm and dry.     Assessment/Plan Dehydration  This may be because of a urinary tract infection. Cultures of the urine is pending. We will continue IV rehydration and leave the Foley in place until tomorrow. He already feels much better. His abdomen is benign.  Bettejane Leavens A 04/11/2015, 9:11 PM

## 2015-04-11 NOTE — Progress Notes (Signed)
Report called to [place], RN [address]

## 2015-04-12 ENCOUNTER — Encounter (HOSPITAL_COMMUNITY): Payer: Self-pay | Admitting: *Deleted

## 2015-04-12 DIAGNOSIS — I10 Essential (primary) hypertension: Secondary | ICD-10-CM | POA: Diagnosis present

## 2015-04-12 DIAGNOSIS — C801 Malignant (primary) neoplasm, unspecified: Secondary | ICD-10-CM | POA: Diagnosis present

## 2015-04-12 DIAGNOSIS — Z8249 Family history of ischemic heart disease and other diseases of the circulatory system: Secondary | ICD-10-CM | POA: Diagnosis not present

## 2015-04-12 DIAGNOSIS — M1990 Unspecified osteoarthritis, unspecified site: Secondary | ICD-10-CM | POA: Diagnosis present

## 2015-04-12 DIAGNOSIS — E119 Type 2 diabetes mellitus without complications: Secondary | ICD-10-CM | POA: Diagnosis present

## 2015-04-12 DIAGNOSIS — Z794 Long term (current) use of insulin: Secondary | ICD-10-CM | POA: Diagnosis not present

## 2015-04-12 DIAGNOSIS — E785 Hyperlipidemia, unspecified: Secondary | ICD-10-CM | POA: Diagnosis present

## 2015-04-12 DIAGNOSIS — N179 Acute kidney failure, unspecified: Secondary | ICD-10-CM | POA: Diagnosis present

## 2015-04-12 DIAGNOSIS — Z7982 Long term (current) use of aspirin: Secondary | ICD-10-CM | POA: Diagnosis not present

## 2015-04-12 DIAGNOSIS — C20 Malignant neoplasm of rectum: Secondary | ICD-10-CM | POA: Diagnosis present

## 2015-04-12 DIAGNOSIS — Z87891 Personal history of nicotine dependence: Secondary | ICD-10-CM | POA: Diagnosis not present

## 2015-04-12 DIAGNOSIS — R339 Retention of urine, unspecified: Secondary | ICD-10-CM | POA: Diagnosis present

## 2015-04-12 DIAGNOSIS — Z79899 Other long term (current) drug therapy: Secondary | ICD-10-CM | POA: Diagnosis not present

## 2015-04-12 DIAGNOSIS — Z833 Family history of diabetes mellitus: Secondary | ICD-10-CM | POA: Diagnosis not present

## 2015-04-12 DIAGNOSIS — Z823 Family history of stroke: Secondary | ICD-10-CM | POA: Diagnosis not present

## 2015-04-12 DIAGNOSIS — Z809 Family history of malignant neoplasm, unspecified: Secondary | ICD-10-CM | POA: Diagnosis not present

## 2015-04-12 DIAGNOSIS — R338 Other retention of urine: Secondary | ICD-10-CM

## 2015-04-12 DIAGNOSIS — E860 Dehydration: Secondary | ICD-10-CM | POA: Diagnosis present

## 2015-04-12 DIAGNOSIS — R197 Diarrhea, unspecified: Secondary | ICD-10-CM | POA: Diagnosis present

## 2015-04-12 DIAGNOSIS — N39490 Overflow incontinence: Secondary | ICD-10-CM | POA: Diagnosis present

## 2015-04-12 LAB — GLUCOSE, CAPILLARY
GLUCOSE-CAPILLARY: 116 mg/dL — AB (ref 65–99)
GLUCOSE-CAPILLARY: 114 mg/dL — AB (ref 65–99)
Glucose-Capillary: 104 mg/dL — ABNORMAL HIGH (ref 65–99)
Glucose-Capillary: 113 mg/dL — ABNORMAL HIGH (ref 65–99)
Glucose-Capillary: 131 mg/dL — ABNORMAL HIGH (ref 65–99)
Glucose-Capillary: 210 mg/dL — ABNORMAL HIGH (ref 65–99)
Glucose-Capillary: 82 mg/dL (ref 65–99)

## 2015-04-12 LAB — BASIC METABOLIC PANEL
Anion gap: 9 (ref 5–15)
BUN: 34 mg/dL — AB (ref 6–20)
CHLORIDE: 107 mmol/L (ref 101–111)
CO2: 24 mmol/L (ref 22–32)
Calcium: 8 mg/dL — ABNORMAL LOW (ref 8.9–10.3)
Creatinine, Ser: 1.41 mg/dL — ABNORMAL HIGH (ref 0.61–1.24)
GFR calc Af Amer: 56 mL/min — ABNORMAL LOW (ref >60)
GFR calc non Af Amer: 48 mL/min — ABNORMAL LOW (ref >60)
GLUCOSE: 86 mg/dL (ref 65–99)
POTASSIUM: 3.6 mmol/L (ref 3.5–5.1)
SODIUM: 140 mmol/L (ref 135–145)

## 2015-04-12 LAB — CBC
HEMATOCRIT: 36.8 % — AB (ref 39.0–52.0)
HEMOGLOBIN: 12.1 g/dL — AB (ref 13.0–17.0)
MCH: 28.3 pg (ref 26.0–34.0)
MCHC: 32.9 g/dL (ref 30.0–36.0)
MCV: 86 fL (ref 78.0–100.0)
Platelets: 196 10*3/uL (ref 150–400)
RBC: 4.28 MIL/uL (ref 4.22–5.81)
RDW: 15 % (ref 11.5–15.5)
WBC: 11.3 10*3/uL — AB (ref 4.0–10.5)

## 2015-04-12 MED ORDER — ACETAMINOPHEN 325 MG PO TABS: 325.0000 mg | ORAL_TABLET | Freq: Four times a day (QID) | ORAL | Status: DC | PRN

## 2015-04-12 MED ORDER — METOPROLOL SUCCINATE ER 25 MG PO TB24
25.0000 mg | ORAL_TABLET | Freq: Every day | ORAL | Status: DC
  Administered 2015-04-12 – 2015-04-14 (×3): 25 mg via ORAL
  Filled 2015-04-12 (×3): qty 1

## 2015-04-12 MED ORDER — LIP MEDEX EX OINT
1.0000 "application " | TOPICAL_OINTMENT | Freq: Two times a day (BID) | CUTANEOUS | Status: DC
  Administered 2015-04-12 – 2015-04-14 (×5): 1 via TOPICAL
  Filled 2015-04-12: qty 7

## 2015-04-12 MED ORDER — PROMETHAZINE HCL 25 MG/ML IJ SOLN: 6.2500 mg | INTRAMUSCULAR | Status: DC | PRN

## 2015-04-12 MED ORDER — FOLIC ACID 1 MG PO TABS
1.0000 mg | ORAL_TABLET | Freq: Every morning | ORAL | Status: DC
  Administered 2015-04-12 – 2015-04-14 (×3): 1 mg via ORAL
  Filled 2015-04-12 (×3): qty 1

## 2015-04-12 MED ORDER — DIPHENHYDRAMINE HCL 50 MG/ML IJ SOLN: 12.5000 mg | Freq: Four times a day (QID) | INTRAMUSCULAR | Status: DC | PRN

## 2015-04-12 MED ORDER — PHENOL 1.4 % MT LIQD
2.0000 | OROMUCOSAL | Status: DC | PRN
  Filled 2015-04-12: qty 177

## 2015-04-12 MED ORDER — SACCHAROMYCES BOULARDII 250 MG PO CAPS
250.0000 mg | ORAL_CAPSULE | Freq: Two times a day (BID) | ORAL | Status: DC
  Administered 2015-04-12 – 2015-04-14 (×5): 250 mg via ORAL
  Filled 2015-04-12 (×7): qty 1

## 2015-04-12 MED ORDER — BISMUTH SUBSALICYLATE 262 MG/15ML PO SUSP
30.0000 mL | Freq: Three times a day (TID) | ORAL | Status: DC | PRN
  Administered 2015-04-12: 30 mL via ORAL
  Filled 2015-04-12: qty 236

## 2015-04-12 MED ORDER — METOPROLOL TARTRATE 12.5 MG HALF TABLET
12.5000 mg | ORAL_TABLET | Freq: Two times a day (BID) | ORAL | Status: DC | PRN
  Filled 2015-04-12 (×2): qty 1

## 2015-04-12 MED ORDER — ONDANSETRON HCL 4 MG PO TABS: 4.0000 mg | ORAL_TABLET | Freq: Four times a day (QID) | ORAL | Status: DC | PRN

## 2015-04-12 MED ORDER — TRAMADOL HCL 50 MG PO TABS: 50.0000 mg | ORAL_TABLET | Freq: Four times a day (QID) | ORAL | Status: DC | PRN

## 2015-04-12 MED ORDER — LACTATED RINGERS IV BOLUS (SEPSIS)
1000.0000 mL | Freq: Three times a day (TID) | INTRAVENOUS | Status: AC | PRN
Start: 2015-04-12 — End: 2015-04-14

## 2015-04-12 MED ORDER — ALUM & MAG HYDROXIDE-SIMETH 200-200-20 MG/5ML PO SUSP: 30.0000 mL | Freq: Four times a day (QID) | ORAL | Status: DC | PRN

## 2015-04-12 MED ORDER — ASPIRIN EC 81 MG PO TBEC
162.0000 mg | DELAYED_RELEASE_TABLET | Freq: Every day | ORAL | Status: DC
  Administered 2015-04-12 – 2015-04-13 (×2): 162 mg via ORAL
  Filled 2015-04-12 (×4): qty 2

## 2015-04-12 MED ORDER — MENTHOL 3 MG MT LOZG
1.0000 | LOZENGE | OROMUCOSAL | Status: DC | PRN
  Filled 2015-04-12: qty 9

## 2015-04-12 MED ORDER — METOPROLOL TARTRATE 1 MG/ML IV SOLN
5.0000 mg | Freq: Four times a day (QID) | INTRAVENOUS | Status: DC | PRN
  Filled 2015-04-12: qty 5

## 2015-04-12 MED ORDER — LACTATED RINGERS IV BOLUS (SEPSIS)
1000.0000 mL | Freq: Once | INTRAVENOUS | Status: AC
Start: 2015-04-12 — End: 2015-04-12
  Administered 2015-04-12: 1000 mL via INTRAVENOUS

## 2015-04-12 MED ORDER — MAGIC MOUTHWASH
15.0000 mL | Freq: Four times a day (QID) | ORAL | Status: DC | PRN
  Filled 2015-04-12: qty 15

## 2015-04-12 NOTE — Progress Notes (Addendum)
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  ARF resolving.  Consider urology outpt eval w voiding trial -CT scan if worse to r/o abscess/leak, etc.  Seems less likely w NO PAIN & no peritonitis.  Sx relieved immediately w Foley.  Guarded though -Rehydrate - still neg on I&O w 1865m out the first 8 hours -Adv diet gradually - try fulls -Bowel regimen -DM control - sensitive SSI for now since running low - usually on 150U lantus daily though... -HTN control - lower dose metoprolol -VTE prophylaxis- SCDs, etc -mobilize as tolerated to help recovery  SAdin Hector M.D., F.A.C.S. Gastrointestinal and Minimally Invasive [facility] [address] [place] [phone] / Paging   04/12/2015  Subjective:  Events noted - Felt fine Sat at d/c then felt pressure/pain right post flank and RLQ with small urination leading to nausea & retching Sat night/Sun AM - [facility]  No pain after foley replaced - "that pain on my side just went right away like last time".  No anal/"hemorrhoid" pain  Tol liquids but "salty broth runs through me"  Objective:  Vital signs:  Filed Vitals:   04/11/15 2203 04/12/15 0215 04/12/15 0221 04/12/15 0443  BP: 138/78 110/62  150/61  Pulse: 68 54  56  Temp: 98.6 F (37 C) 97.8 F (36.6 C)  98.8 F (37.1 C)  TempSrc: Oral Oral  Oral  Resp:  '22 20  22  ' Height:   '5\' 7"'  (1.702 m)   Weight:   96.7 kg (213 lb 3 oz)   SpO2: 98% 100%  97%    Last BM Date: 04/11/15  Intake/Output   Yesterday:  06/26 0701 - 06/27 0700 In: 1660 [P.O.:240; I.V.:1420] Out: 2100 [Urine:2100] This shift:     Bowel function:  Flatus: y  BM: loose  Drain: clear yellow urine  Physical Exam:  General: Pt awake/alert/oriented x4 in no acute distress Eyes: PERRL, normal EOM.  Sclera clear.  No icterus Neuro: CN II-XII intact w/o focal sensory/motor deficits. Lymph: No head/neck/groin lymphadenopathy Psych:  No delerium/psychosis/paranoia HENT: Normocephalic, Mucus membranes moist.  No thrush Neck: Supple, No tracheal deviation Chest: No chest wall pain w good excursion CV:  Pulses intact.  Regular rhythm MS: Normal AROM mjr joints.  No obvious deformity Abdomen: Soft.  Obese.  Nondistended.  NONTENDER.  No pain w cough, percussion, RT.  No guarding.  No evidence of peritonitis.  No incarcerated hernias. Ext:  SCDs BLE.  No mjr edema.  No cyanosis Skin: No petechiae / purpura  Results:   Labs: Results for orders placed or performed during the hospital encounter of 04/11/15 (from the past 48 hour(s))  Urinalysis, [facility] w reflex microscopic ([facility]     Status: Abnormal   Collection Time: 04/11/15  4:58 PM  Result Value Ref Range   Color, Urine YELLOW YELLOW   APPearance CLEAR  CLEAR   Specific Gravity, Urine 1.014 1.005 - 1.030   pH 5.5 5.0 - 8.0   Glucose, UA 100 (A) NEGATIVE mg/dL   Hgb urine dipstick MODERATE (A) NEGATIVE   Bilirubin Urine NEGATIVE NEGATIVE   Ketones, ur NEGATIVE NEGATIVE mg/dL   Protein, ur >300 (A) NEGATIVE mg/dL   Urobilinogen, UA 0.2 0.0 - 1.0 mg/dL   Nitrite NEGATIVE NEGATIVE   Leukocytes, UA NEGATIVE NEGATIVE  Urine microscopic-add on     Status: Abnormal   Collection Time: 04/11/15  4:58 PM  Result Value Ref Range   Squamous Epithelial / LPF RARE RARE   WBC, UA 3-6 <3 WBC/hpf   RBC / HPF 21-50 <3  RBC/hpf   Bacteria, UA FEW (A) RARE  CBG monitoring, ED     Status: Abnormal   Collection Time: 04/11/15  5:24 PM  Result Value Ref Range   Glucose-Capillary 211 (H) 65 - 99 mg/dL  Comprehensive metabolic panel     Status: Abnormal   Collection Time: 04/11/15  5:41 PM  Result Value Ref Range   Sodium 139 135 - 145 mmol/L   Potassium 3.9 3.5 - 5.1 mmol/L   Chloride 104 101 - 111 mmol/L   CO2 22 22 - 32 mmol/L   Glucose, Bld 243 (H) 65 - 99 mg/dL   BUN 36 (H) 6 - 20 mg/dL   Creatinine, Ser 1.72 (H) 0.61 - 1.24 mg/dL   Calcium 8.7 (L) 8.9 - 10.3 mg/dL   Total Protein 6.7 6.5 - 8.1 g/dL   Albumin 2.9 (L) 3.5 - 5.0 g/dL   AST 23 15 - 41 U/L   ALT 16 (L) 17 - 63 U/L   Alkaline Phosphatase 69 38 - 126 U/L   Total Bilirubin 1.3 (H) 0.3 - 1.2 mg/dL   GFR calc non Af Amer 38 (L) >60 mL/min   GFR calc Af Amer 44 (L) >60 mL/min    Comment: (NOTE) The eGFR has been calculated using the CKD EPI equation. This calculation has not been validated in all clinical situations. eGFR's persistently <60 mL/min signify possible Chronic Kidney Disease.    Anion gap 13 5 - 15  CBC with Differential     Status: Abnormal   Collection Time: 04/11/15  5:41 PM  Result Value Ref Range   WBC 14.9 (H) 4.0 - 10.5 K/uL   RBC 4.87 4.22 - 5.81 MIL/uL   Hemoglobin 14.4 13.0 - 17.0 g/dL   HCT 42.3 39.0 - 52.0 %   MCV 86.9 78.0 - 100.0 fL   MCH 29.6 26.0 - 34.0 pg   MCHC 34.0 30.0 - 36.0 g/dL   RDW 14.9 11.5 - 15.5 %   Platelets 274 150 - 400 K/uL   Neutrophils Relative % 81 (H) 43 - 77 %   Neutro Abs 12.0 (H) 1.7 - 7.7 K/uL   Lymphocytes Relative 9 (L) 12 - 46 %   Lymphs Abs 1.3 0.7 - 4.0 K/uL   Monocytes Relative 10 3 - 12 %   Monocytes Absolute 1.5 (H) 0.1 - 1.0 K/uL   Eosinophils Relative 0 0 - 5 %   Eosinophils Absolute 0.0 0.0 - 0.7 K/uL   Basophils Relative 0 0 - 1 %   Basophils Absolute 0.0 0.0 - 0.1 K/uL  I-Stat CG4 Lactic Acid, ED (Not at [facility] or [facility])     Status: Abnormal   Collection Time:  04/11/15  5:51 PM  Result Value Ref Range   Lactic Acid, Venous 2.15 (HH)  0.5 - 2.0 mmol/L   Comment NOTIFIED PHYSICIAN   Glucose, capillary     Status: Abnormal   Collection Time: 04/11/15  8:01 PM  Result Value Ref Range   Glucose-Capillary 187 (H) 65 - 99 mg/dL  Glucose, capillary     Status: Abnormal   Collection Time: 04/12/15 12:07 AM  Result Value Ref Range   Glucose-Capillary 131 (H) 65 - 99 mg/dL  Glucose, capillary     Status: None   Collection Time: 04/12/15  4:41 AM  Result Value Ref Range   Glucose-Capillary 82 65 - 99 mg/dL  Basic metabolic panel     Status: Abnormal   Collection Time: 04/12/15  5:10 AM  Result Value Ref Range   Sodium 140 135 - 145 mmol/L   Potassium 3.6 3.5 - 5.1 mmol/L   Chloride 107 101 - 111 mmol/L   CO2 24 22 - 32 mmol/L   Glucose, Bld 86 65 - 99 mg/dL   BUN 34 (H) 6 - 20 mg/dL   Creatinine, Ser 1.41 (H) 0.61 - 1.24 mg/dL   Calcium 8.0 (L) 8.9 - 10.3 mg/dL   GFR calc non Af Amer 48 (L) >60 mL/min   GFR calc Af Amer 56 (L) >60 mL/min    Comment: (NOTE) The eGFR has been calculated using the CKD EPI equation. This calculation has not been validated in all clinical situations. eGFR's persistently <60 mL/min signify possible Chronic Kidney Disease.    Anion gap 9 5 - 15  CBC     Status: Abnormal   Collection Time: 04/12/15  5:10 AM  Result Value Ref Range   WBC 11.3 (H) 4.0 - 10.5 K/uL   RBC 4.28 4.22 - 5.81 MIL/uL   Hemoglobin 12.1 (L) 13.0 - 17.0 g/dL   HCT 36.8 (L) 39.0 - 52.0 %   MCV 86.0 78.0 - 100.0 fL   MCH 28.3 26.0 - 34.0 pg   MCHC 32.9 30.0 - 36.0 g/dL   RDW 15.0 11.5 - 15.5 %   Platelets 196 150 - 400 K/uL  Glucose, capillary     Status: Abnormal   Collection Time: 04/12/15  7:35 AM  Result Value Ref Range   Glucose-Capillary 104 (H) 65 - 99 mg/dL    Imaging / Studies: No results found.  Medications / Allergies: per chart  Antibiotics: Anti-infectives    None        Note: Portions of this report may have  been transcribed using voice recognition software. Every effort was made to ensure accuracy; however, inadvertent computerized transcription errors may be present.   Any transcriptional errors that result from this process are unintentional.     Adin Hector, M.D., F.A.C.S. Gastrointestinal and Minimally Invasive Surgery [facility] [address] [place], [place] [address] [phone] Main / Paging   04/12/2015  CARE TEAM:  PCP: Delia Chimes, NP  Outpatient Care Team: Patient Care Team: Delia Chimes, NP as PCP - General (Nurse Practitioner) Inda Castle, MD as Consulting Physician (Gastroenterology) Michael Boston, MD as Consulting Physician (General Surgery) Gavin Pound, MD as Consulting Physician (Rheumatology)  Inpatient Treatment Team: Treatment Team: Attending Provider: Michael Boston, MD; Registered Nurse: Jola Schmidt, RN; Registered Nurse: Bailey Mech, RN; Licensed Practical Nurse: [place] Senate, [place]

## 2015-04-12 NOTE — Progress Notes (Signed)
Pt felt much better after I&O cath done - 103mL out (!).  R flank pain gone.  No pain anywhere Tolerating PO - no more nausea D/w RN - I rec'd to RN to place Foley for persistent urinary retention if still having problems w urinating Decreased Lantus from home dose 75 U BID to 1/2 dose already - will go down to 20U BID 1/2 dose metoprolol for now  Adin Hector, M.D., F.A.C.S. Gastrointestinal and Minimally Invasive Surgery [facility] [address], [place], [place] [address] [phone] Main / Paging

## 2015-04-12 NOTE — Progress Notes (Signed)
Pt's apt with Gavin Pound cancelled for Thursday per patient's request. Patient to call Dr. Trudie Reed' office to reschedule.

## 2015-04-12 NOTE — Progress Notes (Signed)
Date: April 12, 2015 Chart reviewed for concurrent status and case management needs. Velva Harman, RN, BSN, [place]   [phone]

## 2015-04-12 NOTE — Progress Notes (Signed)
Pt dx urinary retention. Inserted foley catheter per order without difficulty. At first drained pink colored urine, then changed to amber. Drained approx 200 cc initially.

## 2015-04-13 LAB — BASIC METABOLIC PANEL
Anion gap: 9 (ref 5–15)
BUN: 21 mg/dL — ABNORMAL HIGH (ref 6–20)
CALCIUM: 8.2 mg/dL — AB (ref 8.9–10.3)
CHLORIDE: 108 mmol/L (ref 101–111)
CO2: 25 mmol/L (ref 22–32)
CREATININE: 1.23 mg/dL (ref 0.61–1.24)
GFR calc non Af Amer: 57 mL/min — ABNORMAL LOW (ref >60)
Glucose, Bld: 137 mg/dL — ABNORMAL HIGH (ref 65–99)
Potassium: 3.6 mmol/L (ref 3.5–5.1)
Sodium: 142 mmol/L (ref 135–145)

## 2015-04-13 LAB — URINE CULTURE: Culture: NO GROWTH

## 2015-04-13 LAB — CBC
HCT: 36.6 % — ABNORMAL LOW (ref 39.0–52.0)
Hemoglobin: 12.1 g/dL — ABNORMAL LOW (ref 13.0–17.0)
MCH: 28.9 pg (ref 26.0–34.0)
MCHC: 33.1 g/dL (ref 30.0–36.0)
MCV: 87.4 fL (ref 78.0–100.0)
Platelets: 196 10*3/uL (ref 150–400)
RBC: 4.19 MIL/uL — ABNORMAL LOW (ref 4.22–5.81)
RDW: 14.9 % (ref 11.5–15.5)
WBC: 9 10*3/uL (ref 4.0–10.5)

## 2015-04-13 LAB — GLUCOSE, CAPILLARY
Glucose-Capillary: 105 mg/dL — ABNORMAL HIGH (ref 65–99)
Glucose-Capillary: 118 mg/dL — ABNORMAL HIGH (ref 65–99)
Glucose-Capillary: 138 mg/dL — ABNORMAL HIGH (ref 65–99)
Glucose-Capillary: 201 mg/dL — ABNORMAL HIGH (ref 65–99)
Glucose-Capillary: 143 mg/dL — ABNORMAL HIGH (ref 65–99)

## 2015-04-13 MED ORDER — SODIUM CHLORIDE 0.9 % IJ SOLN
3.0000 mL | Freq: Two times a day (BID) | INTRAMUSCULAR | Status: DC
  Administered 2015-04-13: 3 mL via INTRAVENOUS

## 2015-04-13 MED ORDER — INSULIN ASPART 100 UNIT/ML ~~LOC~~ SOLN
0.0000 [IU] | Freq: Three times a day (TID) | SUBCUTANEOUS | Status: DC
  Administered 2015-04-13: 2 [IU] via SUBCUTANEOUS
  Administered 2015-04-13: 5 [IU] via SUBCUTANEOUS
  Administered 2015-04-14 (×2): 3 [IU] via SUBCUTANEOUS

## 2015-04-13 MED ORDER — PSYLLIUM 95 % PO PACK
1.0000 | PACK | Freq: Every day | ORAL | Status: DC
  Administered 2015-04-13 – 2015-04-14 (×2): 1 via ORAL
  Filled 2015-04-13 (×2): qty 1

## 2015-04-13 MED ORDER — SODIUM CHLORIDE 0.9 % IJ SOLN: 3.0000 mL | INTRAMUSCULAR | Status: DC | PRN

## 2015-04-13 MED ORDER — LACTATED RINGERS IV BOLUS (SEPSIS): 1000.0000 mL | Freq: Three times a day (TID) | INTRAVENOUS | Status: DC | PRN

## 2015-04-13 MED ORDER — SODIUM CHLORIDE 0.9 % IV SOLN: 250.0000 mL | INTRAVENOUS | Status: DC | PRN

## 2015-04-13 NOTE — Progress Notes (Signed)
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  ARF resolving.  Consider urology outpt eval w voiding trial -CT scan if worse to r/o abscess/leak, etc.  Seems less likely w NO PAIN & no peritonitis.  Sx relieved immediately w Foley.  Guarded though -Wean off IVF & follow -Adv diet gradually - try solids -Bowel regimen -DM control - sensitive SSI for now since running low - usually on 150U lantus daily though... -HTN control - lower dose metoprolol for now.  Check ortho VS -VTE prophylaxis- SCDs, etc -mobilize as tolerated to help recovery  Adin Hector, M.D., F.A.C.S. Gastrointestinal and Minimally Invasive Surgery [facility] [address] [place], [place] [address] [phone] Main / Paging   04/13/2015  Subjective:  Events noted - Felt fine Sat at d/c then felt pressure/pain right post flank and RLQ with small urination leading to nausea & retching Sat night/Sun AM - came back to [facility] via ED.  No pain since foley replaced - "that pain on my side just went right away like last time".  No anal/"hemorrhoid" pain  Tol full liquids - no nausea/retching  Objective:  Vital signs:  Filed Vitals:   04/12/15 1808 04/12/15 2124 04/13/15 0148 04/13/15 0557  BP: 153/64 134/54 134/57 152/57  Pulse: 60 56 52 57  Temp: 99 F (37.2 C) 98.5 F (36.9 C) 99.2 F (37.3 C) 98.8 F (37.1 C)  TempSrc: Oral Oral Oral Oral   Resp: '20 20 18 16  ' Height:      Weight:      SpO2: 97% 95% 97% 98%    Last BM Date: 04/12/15  Intake/Output   Yesterday:  06/27 0701 - 06/28 0700 In: 1351.3 [I.V.:1351.3] Out: 2975 [Urine:2975] This shift:     Bowel function:  Flatus: y  BM: x3  Drain: clear yellow urine  Physical Exam:  General: Pt awake/alert/oriented x4 in no acute distress Eyes: PERRL, normal EOM.  Sclera clear.  No icterus Neuro: CN II-XII intact w/o focal sensory/motor deficits. Lymph: No head/neck/groin lymphadenopathy Psych:  No delerium/psychosis/paranoia HENT: Normocephalic, Mucus membranes moist.  No thrush Neck: Supple, No tracheal deviation Chest: No chest wall pain w good excursion CV:  Pulses intact.  Regular rhythm MS: Normal AROM mjr joints.  No obvious deformity Abdomen: Soft.  Obese.  Nondistended.  NONTENDER.  No pain w cough, percussion, RT.  No guarding.  No evidence of peritonitis.  No incarcerated hernias. GU: NEMG.  Foley colorless yellow clear Ext:  SCDs BLE.  No mjr edema.  No cyanosis Skin: No petechiae / purpura  Results:   Labs: Results for orders placed or performed during the hospital encounter of 04/11/15 (from the past 48 hour(s))  Urinalysis, Routine w reflex microscopic (not at [facility])     Status: Abnormal   Collection Time: 04/11/15  4:58 PM  Result Value Ref Range   Color, Urine YELLOW YELLOW   APPearance CLEAR CLEAR  Specific Gravity, Urine 1.014 1.005 - 1.030   pH 5.5 5.0 - 8.0   Glucose, UA 100 (A) NEGATIVE mg/dL   Hgb urine dipstick MODERATE (A) NEGATIVE   Bilirubin Urine NEGATIVE NEGATIVE   Ketones, ur NEGATIVE NEGATIVE mg/dL   Protein, ur >300 (A) NEGATIVE mg/dL   Urobilinogen, UA 0.2 0.0 - 1.0 mg/dL   Nitrite NEGATIVE NEGATIVE   Leukocytes, UA NEGATIVE NEGATIVE  Urine microscopic-add on     Status: Abnormal   Collection Time: 04/11/15  4:58 PM  Result Value Ref Range   Squamous Epithelial / LPF RARE RARE   WBC, UA 3-6 <3 WBC/hpf   RBC / HPF  21-50 <3 RBC/hpf   Bacteria, UA FEW (A) RARE  CBG monitoring, ED     Status: Abnormal   Collection Time: 04/11/15  5:24 PM  Result Value Ref Range   Glucose-Capillary 211 (H) 65 - 99 mg/dL  Comprehensive metabolic panel     Status: Abnormal   Collection Time: 04/11/15  5:41 PM  Result Value Ref Range   Sodium 139 135 - 145 mmol/L   Potassium 3.9 3.5 - 5.1 mmol/L   Chloride 104 101 - 111 mmol/L   CO2 22 22 - 32 mmol/L   Glucose, Bld 243 (H) 65 - 99 mg/dL   BUN 36 (H) 6 - 20 mg/dL   Creatinine, Ser 1.72 (H) 0.61 - 1.24 mg/dL   Calcium 8.7 (L) 8.9 - 10.3 mg/dL   Total Protein 6.7 6.5 - 8.1 g/dL   Albumin 2.9 (L) 3.5 - 5.0 g/dL   AST 23 15 - 41 U/L   ALT 16 (L) 17 - 63 U/L   Alkaline Phosphatase 69 38 - 126 U/L   Total Bilirubin 1.3 (H) 0.3 - 1.2 mg/dL   GFR calc non Af Amer 38 (L) >60 mL/min   GFR calc Af Amer 44 (L) >60 mL/min    Comment: (NOTE) The eGFR has been calculated using the CKD EPI equation. This calculation has not been validated in all clinical situations. eGFR's persistently <60 mL/min signify possible Chronic Kidney Disease.    Anion gap 13 5 - 15  CBC with Differential     Status: Abnormal   Collection Time: 04/11/15  5:41 PM  Result Value Ref Range   WBC 14.9 (H) 4.0 - 10.5 K/uL   RBC 4.87 4.22 - 5.81 MIL/uL   Hemoglobin 14.4 13.0 - 17.0 g/dL   HCT 42.3 39.0 - 52.0 %   MCV 86.9 78.0 - 100.0 fL   MCH 29.6 26.0 - 34.0 pg   MCHC 34.0 30.0 - 36.0 g/dL   RDW 14.9 11.5 - 15.5 %   Platelets 274 150 - 400 K/uL   Neutrophils Relative % 81 (H) 43 - 77 %   Neutro Abs 12.0 (H) 1.7 - 7.7 K/uL   Lymphocytes Relative 9 (L) 12 - 46 %   Lymphs Abs 1.3 0.7 - 4.0 K/uL   Monocytes Relative 10 3 - 12 %   Monocytes Absolute 1.5 (H) 0.1 - 1.0 K/uL   Eosinophils Relative 0 0 - 5 %   Eosinophils Absolute 0.0 0.0 - 0.7 K/uL   Basophils Relative 0 0 - 1 %   Basophils Absolute 0.0 0.0 - 0.1 K/uL  I-Stat CG4 Lactic Acid, ED (Not at [facility] or [facility])     Status: Abnormal    Collection Time: 04/11/15  5:51 PM  Result Value Ref Range   Lactic Acid, Venous 2.15 (HH) 0.5 - 2.0  mmol/L   Comment NOTIFIED PHYSICIAN   Glucose, capillary     Status: Abnormal   Collection Time: 04/11/15  8:01 PM  Result Value Ref Range   Glucose-Capillary 187 (H) 65 - 99 mg/dL  Glucose, capillary     Status: Abnormal   Collection Time: 04/12/15 12:07 AM  Result Value Ref Range   Glucose-Capillary 131 (H) 65 - 99 mg/dL  Glucose, capillary     Status: None   Collection Time: 04/12/15  4:41 AM  Result Value Ref Range   Glucose-Capillary 82 65 - 99 mg/dL  Basic metabolic panel     Status: Abnormal   Collection Time: 04/12/15  5:10 AM  Result Value Ref Range   Sodium 140 135 - 145 mmol/L   Potassium 3.6 3.5 - 5.1 mmol/L   Chloride 107 101 - 111 mmol/L   CO2 24 22 - 32 mmol/L   Glucose, Bld 86 65 - 99 mg/dL   BUN 34 (H) 6 - 20 mg/dL   Creatinine, Ser 1.41 (H) 0.61 - 1.24 mg/dL   Calcium 8.0 (L) 8.9 - 10.3 mg/dL   GFR calc non Af Amer 48 (L) >60 mL/min   GFR calc Af Amer 56 (L) >60 mL/min    Comment: (NOTE) The eGFR has been calculated using the CKD EPI equation. This calculation has not been validated in all clinical situations. eGFR's persistently <60 mL/min signify possible Chronic Kidney Disease.    Anion gap 9 5 - 15  CBC     Status: Abnormal   Collection Time: 04/12/15  5:10 AM  Result Value Ref Range   WBC 11.3 (H) 4.0 - 10.5 K/uL   RBC 4.28 4.22 - 5.81 MIL/uL   Hemoglobin 12.1 (L) 13.0 - 17.0 g/dL   HCT 36.8 (L) 39.0 - 52.0 %   MCV 86.0 78.0 - 100.0 fL   MCH 28.3 26.0 - 34.0 pg   MCHC 32.9 30.0 - 36.0 g/dL   RDW 15.0 11.5 - 15.5 %   Platelets 196 150 - 400 K/uL  Glucose, capillary     Status: Abnormal   Collection Time: 04/12/15  7:35 AM  Result Value Ref Range   Glucose-Capillary 104 (H) 65 - 99 mg/dL  Glucose, capillary     Status: Abnormal   Collection Time: 04/12/15 11:58 AM  Result Value Ref Range   Glucose-Capillary 116 (H) 65 - 99 mg/dL  Glucose,  capillary     Status: Abnormal   Collection Time: 04/12/15  4:56 PM  Result Value Ref Range   Glucose-Capillary 114 (H) 65 - 99 mg/dL  Glucose, capillary     Status: Abnormal   Collection Time: 04/12/15  8:08 PM  Result Value Ref Range   Glucose-Capillary 210 (H) 65 - 99 mg/dL  Glucose, capillary     Status: Abnormal   Collection Time: 04/12/15 11:56 PM  Result Value Ref Range   Glucose-Capillary 113 (H) 65 - 99 mg/dL  Glucose, capillary     Status: Abnormal   Collection Time: 04/13/15  4:02 AM  Result Value Ref Range   Glucose-Capillary 105 (H) 65 - 99 mg/dL    Imaging / Studies: No results found.  Medications / Allergies: per chart  Antibiotics: Anti-infectives    None        Note: Portions of this report may have been transcribed using voice recognition software. Every effort was made to ensure accuracy; however, inadvertent computerized transcription errors may be present.   Any transcriptional errors that result from  this process are unintentional.     Adin Hector, M.D., F.A.C.S. Gastrointestinal and Minimally Invasive Surgery [facility] [address] [place], [place] [address] [phone] Main / Paging   04/13/2015  CARE TEAM:  PCP: Delia Chimes, NP  Outpatient Care Team: Patient Care Team: Delia Chimes, NP as PCP - General (Nurse Practitioner) Inda Castle, MD as Consulting Physician (Gastroenterology) Michael Boston, MD as Consulting Physician (General Surgery) Gavin Pound, MD as Consulting Physician (Rheumatology)  Inpatient Treatment Team: Treatment Team: Attending Provider: Michael Boston, MD; Registered Nurse: Jola Schmidt, RN; Registered Nurse: Bailey Mech, RN; Licensed Practical Nurse: [place] Senate, [place]; Registered Nurse: Cindee Lame, RN; Registered Nurse: Vilma Meckel, RN; Technician: Leeanne Rio, NT; Registered Nurse: Janifer Adie, RN

## 2015-04-13 NOTE — ED Provider Notes (Signed)
CSN: [id number]     Arrival date & time 04/11/15  1658 History   First MD Initiated Contact with Patient 04/11/15 1721     Chief Complaint  Patient presents with  . Post-op Problem     (Consider location/radiation/quality/duration/timing/severity/associated sxs/prior Treatment) HPI Comments: Patient reports having a transrectal mass removed by Dr. gross this week, he was discharged home yesterday and states that he has been feeling poorly with urinary incontinence, diarrhea and malaise.  He denies fevers, chills, abdominal pain.   Past Medical History  Diagnosis Date  . Hypertension   . Diabetes mellitus   . Hyperlipidemia   . Arthritis   . Vitamin D deficiency   . 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 Cardiovascular stress test  10/12/1999    EF 63%. NO ISCHEMIA  . Flexible sigmoidoscopy N/A 02/02/2015    Procedure: FLEXIBLE SIGMOIDOSCOPY;  Surgeon: Inda Castle, MD;  Location: [facility];  Service: Endoscopy;  Laterality: N/A;  ERBE  . Eus N/A 03/11/2015    Procedure: LOWER ENDOSCOPIC ULTRASOUND (EUS);  Surgeon: Milus Banister, MD;  Location: [facility];  Service: Endoscopy;  Laterality: N/A;  . Tonsillectomy and adenoidectomy      as child  . Abcess drainage Left     buttocks  . Colonoscopy w/ polypectomy      5 polyps  . Partial proctectomy by tem N/A 04/08/2015    Procedure: TEM PARTIAL PROCTECTOMY OF RECTAL MASS;  Surgeon: Michael Boston, MD;  Location: [facility];  Service: General;  Laterality: N/A;   Family History  Problem Relation Age of Onset  . Cancer Father     "all over"  . Coronary artery disease Brother   . Diabetes Brother   . Stroke Mother   . Diabetes Mother   . Colon cancer Neg Hx   . Esophageal cancer Neg Hx   . Rectal cancer Neg Hx   . Stomach cancer Neg Hx    History  Substance Use Topics  . Smoking status: Former Smoker    Types: Cigarettes    Quit date: 08/13/1994  . Smokeless tobacco: Never  Used  . Alcohol Use: No    Review of Systems  Constitutional: Negative for fever, activity change, appetite change and fatigue.  HENT: Negative for congestion, facial swelling, rhinorrhea and trouble swallowing.   Eyes: Negative for photophobia and pain.  Respiratory: Negative for cough, chest tightness and shortness of breath.   Cardiovascular: Negative for chest pain and leg swelling.  Gastrointestinal: Positive for diarrhea. Negative for nausea, vomiting, abdominal pain and constipation.  Endocrine: Positive for polyuria. Negative for polydipsia.  Genitourinary: Negative for dysuria, urgency, decreased urine volume and difficulty urinating.  Musculoskeletal: Negative for back pain and gait problem.  Skin: Negative for color change, rash and wound.  Allergic/Immunologic: Negative for immunocompromised state.  Neurological: Negative for dizziness, facial asymmetry, speech difficulty, weakness, numbness and headaches.  Psychiatric/Behavioral: Negative for confusion, decreased concentration and agitation.      Allergies  Review of patient's allergies indicates no known allergies.  Home Medications   Prior to Admission medications   Medication Sig Start Date End Date Taking? Authorizing Provider  aspirin 81 MG tablet Take 162 mg by mouth at bedtime.    Yes Historical Provider, MD  folic acid (FOLVITE) 1 MG tablet Take 1 mg by mouth every morning.    Yes Historical Provider, MD  losartan-hydrochlorothiazide (HYZAAR) 100-25 MG per tablet TAKE 1 TABLET  DAILY (PATIENT NEEDS TO CALL OFFICE TO SCHEDULE FOLLOW UP APPOINTMENT) Patient taking differently: take one tablet by  mouth every morning. 12/24/13  Yes Darlin Coco, MD  methotrexate (RHEUMATREX) 2.5 MG tablet Take 20 mg by mouth every Friday. 12/04/14  Yes Historical Provider, MD  metoprolol succinate (TOPROL-XL) 50 MG 24 hr tablet Take 1 tablet (50 mg total) by mouth daily. Take with or immediately following a meal. 01/27/13  Yes  Darlin Coco, MD  rosuvastatin (CRESTOR) 20 MG tablet Take 20 mg by mouth at bedtime.    Yes Historical Provider, MD  insulin glargine (LANTUS) 100 UNIT/ML injection Inject 75 Units into the skin 2 (two) times daily.     Historical Provider, MD  insulin lispro (HUMALOG KWIKPEN) 100 UNIT/ML KiwkPen Inject 2-6 Units into the skin 3 (three) times daily as needed (high blood sugar). Sliding scale    Historical Provider, MD  oxyCODONE (OXY IR/ROXICODONE) 5 MG immediate release tablet Take 1-2 tablets (5-10 mg total) by mouth every 6 (six) hours as needed for moderate pain, severe pain or breakthrough pain. 04/08/15   Michael Boston, MD   BP 157/66 mmHg  Pulse 53  Temp(Src) 97.3 F (36.3 C) (Oral)  Resp 20  Ht 5\' 7"  (1.702 m)  Wt 213 lb 3 oz (96.7 kg)  BMI 33.38 kg/m2  SpO2 98% Physical Exam  Constitutional: He is oriented to person, place, and time. He appears well-developed and well-nourished. No distress.  HENT:  Head: Normocephalic and atraumatic.  Mouth/Throat: No oropharyngeal exudate.  Eyes: Pupils are equal, round, and reactive to light.  Neck: Normal range of motion. Neck supple.  Cardiovascular: Normal rate, regular rhythm and normal heart sounds.  Exam reveals no gallop and no friction rub.   No murmur heard. Pulmonary/Chest: Effort normal and breath sounds normal. No respiratory distress. He has no wheezes. He has no rales.  Abdominal: Soft. Bowel sounds are normal. He exhibits no distension and no mass. There is no tenderness. There is no rebound and no guarding.  Bladder distention  Musculoskeletal: Normal range of motion. He exhibits no edema or tenderness.  Neurological: He is alert and oriented to person, place, and time.  Skin: Skin is warm and dry.  Psychiatric: He has a normal mood and affect.    ED Course  Procedures (including critical care time) Labs Review Labs Reviewed  COMPREHENSIVE METABOLIC PANEL - Abnormal; Notable for the following:    Glucose, Bld 243  (*)    BUN 36 (*)    Creatinine, Ser 1.72 (*)    Calcium 8.7 (*)    Albumin 2.9 (*)    ALT 16 (*)    Total Bilirubin 1.3 (*)    GFR calc non Af Amer 38 (*)    GFR calc Af Amer 44 (*)    All other components within normal limits  CBC WITH DIFFERENTIAL/PLATELET - Abnormal; Notable for the following:    WBC 14.9 (*)    Neutrophils Relative % 81 (*)    Neutro Abs 12.0 (*)    Lymphocytes Relative 9 (*)    Monocytes Absolute 1.5 (*)    All other components within normal limits  URINALYSIS, ROUTINE W REFLEX MICROSCOPIC (NOT AT [facility]) - Abnormal; Notable for the following:    Glucose, UA 100 (*)    Hgb urine dipstick MODERATE (*)    Protein, ur >300 (*)    All other components within normal limits  BASIC METABOLIC PANEL - Abnormal; Notable for the following:  BUN 34 (*)    Creatinine, Ser 1.41 (*)    Calcium 8.0 (*)    GFR calc non Af Amer 48 (*)    GFR calc Af Amer 56 (*)    All other components within normal limits  CBC - Abnormal; Notable for the following:    WBC 11.3 (*)    Hemoglobin 12.1 (*)    HCT 36.8 (*)    All other components within normal limits  URINE MICROSCOPIC-ADD ON - Abnormal; Notable for the following:    Bacteria, UA FEW (*)    All other components within normal limits  GLUCOSE, CAPILLARY - Abnormal; Notable for the following:    Glucose-Capillary 187 (*)    All other components within normal limits  GLUCOSE, CAPILLARY - Abnormal; Notable for the following:    Glucose-Capillary 131 (*)    All other components within normal limits  GLUCOSE, CAPILLARY - Abnormal; Notable for the following:    Glucose-Capillary 104 (*)    All other components within normal limits  GLUCOSE, CAPILLARY - Abnormal; Notable for the following:    Glucose-Capillary 114 (*)    All other components within normal limits  GLUCOSE, CAPILLARY - Abnormal; Notable for the following:    Glucose-Capillary 210 (*)    All other components within normal limits  GLUCOSE, CAPILLARY -  Abnormal; Notable for the following:    Glucose-Capillary 116 (*)    All other components within normal limits  GLUCOSE, CAPILLARY - Abnormal; Notable for the following:    Glucose-Capillary 113 (*)    All other components within normal limits  GLUCOSE, CAPILLARY - Abnormal; Notable for the following:    Glucose-Capillary 105 (*)    All other components within normal limits  CBC - Abnormal; Notable for the following:    RBC 4.19 (*)    Hemoglobin 12.1 (*)    HCT 36.6 (*)    All other components within normal limits  BASIC METABOLIC PANEL - Abnormal; Notable for the following:    Glucose, Bld 137 (*)    BUN 21 (*)    Calcium 8.2 (*)    GFR calc non Af Amer 57 (*)    All other components within normal limits  GLUCOSE, CAPILLARY - Abnormal; Notable for the following:    Glucose-Capillary 118 (*)    All other components within normal limits  I-STAT CG4 LACTIC ACID, ED - Abnormal; Notable for the following:    Lactic Acid, Venous 2.15 (*)    All other components within normal limits  CBG MONITORING, ED - Abnormal; Notable for the following:    Glucose-Capillary 211 (*)    All other components within normal limits  CULTURE, BLOOD (ROUTINE X 2)  CULTURE, BLOOD (ROUTINE X 2)  URINE CULTURE  GLUCOSE, CAPILLARY  I-STAT CG4 LACTIC ACID, ED    Imaging Review No results found.   EKG Interpretation None      MDM   Final diagnoses:  Dehydration  Urinary retention    Pt is a 74 y.o. male with Pmhx as above who presents with malaise, increased urine output and diarrhea since having a transient rectal mass removed by Dr. Johney Maine yesterday.  Patient had to have an in and out cath for urinary retention.  Prior to leaving and states that he has been dribbling urine since going home.  He is also been having some right flank pain, but states that it is much improved from yesterday.  He denies fevers or abdominal pain.  On  physical exam, patient is hypertensive, vital signs otherwise stable.   He is ill but nontoxic appearing.  Abdominal exam  is significant for distended bladder, though is nontender.  Believe his urinary symptoms are actually overflow incontinence from urinary retention.  And out cath done with approximately 400 mL of urine output.  Patient's blood pressure improving with small IV fluid bolus, Dr. Ninfa Linden with general surgery has contacted me regarding this patient and he will admit.        Ernestina Patches, MD 04/13/15 [phone]

## 2015-04-14 LAB — BASIC METABOLIC PANEL
Anion gap: 8 (ref 5–15)
BUN: 17 mg/dL (ref 6–20)
CO2: 25 mmol/L (ref 22–32)
CREATININE: 1.08 mg/dL (ref 0.61–1.24)
Calcium: 8.2 mg/dL — ABNORMAL LOW (ref 8.9–10.3)
Chloride: 108 mmol/L (ref 101–111)
GFR calc Af Amer: >60 mL/min (ref >60)
GFR calc non Af Amer: >60 mL/min (ref >60)
Glucose, Bld: 162 mg/dL — ABNORMAL HIGH (ref 65–99)
Potassium: 3.6 mmol/L (ref 3.5–5.1)
SODIUM: 141 mmol/L (ref 135–145)

## 2015-04-14 LAB — CBC
HCT: 35.4 % — ABNORMAL LOW (ref 39.0–52.0)
Hemoglobin: 11.8 g/dL — ABNORMAL LOW (ref 13.0–17.0)
MCH: 28.9 pg (ref 26.0–34.0)
MCHC: 33.3 g/dL (ref 30.0–36.0)
MCV: 86.8 fL (ref 78.0–100.0)
PLATELETS: 183 10*3/uL (ref 150–400)
RBC: 4.08 MIL/uL — ABNORMAL LOW (ref 4.22–5.81)
RDW: 14.5 % (ref 11.5–15.5)
WBC: 8.4 10*3/uL (ref 4.0–10.5)

## 2015-04-14 LAB — GLUCOSE, CAPILLARY
Glucose-Capillary: 159 mg/dL — ABNORMAL HIGH (ref 65–99)
Glucose-Capillary: 191 mg/dL — ABNORMAL HIGH (ref 65–99)

## 2015-04-14 NOTE — Care Management (Signed)
Important Message  Patient Details  Name: Jake Gross MRN: [id number] Date of Birth: 07/26/1941   Medicare Important Message Given:  Yes-second notification given    Camillo Flaming, LCSW 04/14/2015, 11:16 AM

## 2015-04-14 NOTE — Care Management (Signed)
Important Message  Patient Details  Name: Bexton Charron MRN: [id number] Date of Birth: [birth year]   Medicare Important Message Given:  Yes-second notification given    Camillo Flaming, LCSW 04/14/2015, 11:15 AM

## 2015-04-14 NOTE — Progress Notes (Signed)
Pt attempted to void multiple times since increasing AM PO intake.  Pt reported increased fullness and pressure.  Unable to void even when having a bowel movement.  Bladder scan preformed, measured 431 mL on bladder scan.  Pt will need foley cath replaced per MD order.

## 2015-04-14 NOTE — Discharge Summary (Signed)
Physician Discharge Summary  Patient ID: Jake Gross MRN: [id number] DOB/AGE: August 04, 1941 74 y.o.  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  Postoperatively, the patient gradually mobilized and advanced to a solid diet.  Pain and other symptoms were treated aggressively.  Patient returned with pain.  Had urinary retention.  Catheter placed.  Hydrated.  Creatinine normalized.  Patient wished to try to have catheter removed. Had a high bladder scan.  Therefore, Foley replaced. Patient tolerating solid diet.  No abdominal pain or peritonitis with Foley in place.  By the time of discharge, the patient was walking well the hallways, eating food, having flatus.  Pain was well-controlled on an oral medications.  Based on meeting discharge criteria and continuing to recover, I felt it was safe for  the patient to be discharged from the hospital to further recover with close followup. Postoperative recommendations were discussed in detail.  They are written as well.  Or to transit outpatient urology evaluation after Foley catheter remains for 7 days at least.   Significant Diagnostic Studies:  Results for orders placed or performed during the hospital encounter of 04/11/15 (from the past 72 hour(s))  Culture, blood (routine x 2)     Status: None (Preliminary result)   Collection Time: 04/11/15  4:58 PM  Result Value Ref Range   Specimen Description BLOOD LEFT ANTECUBITAL    Special Requests BOTTLES DRAWN AEROBIC AND ANAEROBIC 5CC EA    Culture      NO GROWTH 1 DAY Performed at [facility]    Report Status PENDING   Urinalysis, Routine w reflex microscopic (not at [facility])     Status: Abnormal   Collection Time: 04/11/15  4:58 PM  Result Value Ref Range   Color, Urine YELLOW YELLOW   APPearance CLEAR CLEAR   Specific Gravity, Urine 1.014 1.005 - 1.030   pH 5.5 5.0 - 8.0   Glucose, UA 100 (A) NEGATIVE mg/dL   Hgb urine dipstick MODERATE (A) NEGATIVE   Bilirubin Urine NEGATIVE NEGATIVE   Ketones, ur NEGATIVE NEGATIVE mg/dL   Protein, ur >300 (A) NEGATIVE mg/dL   Urobilinogen, UA 0.2 0.0 - 1.0 mg/dL   Nitrite NEGATIVE NEGATIVE   Leukocytes, UA NEGATIVE NEGATIVE  Urine culture     Status: None   Collection Time: 04/11/15  4:58 PM  Result Value Ref Range   Specimen Description URINE, CATHETERIZED    Special Requests NONE    Culture      NO GROWTH 1 DAY  Performed at [facility]    Report Status 04/13/2015 FINAL   Urine microscopic-add on     Status: Abnormal   Collection Time: 04/11/15  4:58 PM  Result Value Ref Range   Squamous Epithelial / LPF RARE RARE   WBC, UA 3-6 <3 WBC/hpf   RBC / HPF 21-50 <3 RBC/hpf   Bacteria, UA FEW (A) RARE  CBG monitoring, ED     Status: Abnormal   Collection Time: 04/11/15  5:24 PM  Result Value Ref Range    Glucose-Capillary 211 (H) 65 - 99 mg/dL  Culture, blood (routine x 2)     Status: None (Preliminary result)   Collection Time: 04/11/15  5:40 PM  Result Value Ref Range   Specimen Description BLOOD RIGHT ANTECUBITAL    Special Requests BOTTLES DRAWN AEROBIC AND ANAEROBIC 5CC EA    Culture      NO GROWTH 1 DAY Performed at [facility]    Report Status PENDING   Comprehensive metabolic panel     Status: Abnormal   Collection Time: 04/11/15  5:41 PM  Result Value Ref Range   Sodium 139 135 - 145 mmol/L   Potassium 3.9 3.5 - 5.1 mmol/L   Chloride 104 101 - 111 mmol/L   CO2 22 22 - 32 mmol/L   Glucose, Bld 243 (H) 65 - 99 mg/dL   BUN 36 (H) 6 - 20 mg/dL   Creatinine, Ser 1.72 (H) 0.61 - 1.24 mg/dL   Calcium 8.7 (L) 8.9 - 10.3 mg/dL   Total Protein 6.7 6.5 - 8.1 g/dL   Albumin 2.9 (L) 3.5 - 5.0 g/dL   AST 23 15 - 41 U/L   ALT 16 (L) 17 - 63 U/L   Alkaline Phosphatase 69 38 - 126 U/L   Total Bilirubin 1.3 (H) 0.3 - 1.2 mg/dL   GFR calc non Af Amer 38 (L) >60 mL/min   GFR calc Af Amer 44 (L) >60 mL/min    Comment: (NOTE) The eGFR has been calculated using the CKD EPI equation. This calculation has not been validated in all clinical situations. eGFR's persistently <60 mL/min signify possible Chronic Kidney Disease.    Anion gap 13 5 - 15  CBC with Differential     Status: Abnormal   Collection Time: 04/11/15  5:41 PM  Result Value Ref Range   WBC 14.9 (H) 4.0 - 10.5 K/uL   RBC 4.87 4.22 - 5.81 MIL/uL   Hemoglobin 14.4 13.0 - 17.0 g/dL   HCT 42.3 39.0 - 52.0 %   MCV 86.9 78.0 - 100.0 fL   MCH 29.6 26.0 - 34.0 pg   MCHC 34.0 30.0 - 36.0 g/dL   RDW 14.9 11.5 - 15.5 %   Platelets 274 150 - 400 K/uL   Neutrophils Relative % 81 (H) 43 - 77 %   Neutro Abs 12.0 (H) 1.7 - 7.7 K/uL   Lymphocytes Relative 9 (L) 12 - 46 %   Lymphs Abs 1.3 0.7 - 4.0 K/uL   Monocytes Relative 10 3 - 12 %   Monocytes Absolute 1.5 (H) 0.1 - 1.0 K/uL   Eosinophils Relative 0 0 - 5 %    Eosinophils Absolute 0.0 0.0 - 0.7 K/uL   Basophils Relative 0 0 - 1 %   Basophils Absolute 0.0 0.0 - 0.1 K/uL  I-Stat CG4 Lactic Acid, ED (Not at [facility] or [facility])     Status: Abnormal   Collection Time: 04/11/15  5:51 PM  Result  Value Ref Range   Lactic Acid, Venous 2.15 (HH) 0.5 - 2.0 mmol/L   Comment NOTIFIED PHYSICIAN   Glucose, capillary     Status: Abnormal   Collection Time: 04/11/15  8:01 PM  Result Value Ref Range   Glucose-Capillary 187 (H) 65 - 99 mg/dL  Glucose, capillary     Status: Abnormal   Collection Time: 04/12/15 12:07 AM  Result Value Ref Range   Glucose-Capillary 131 (H) 65 - 99 mg/dL  Glucose, capillary     Status: None   Collection Time: 04/12/15  4:41 AM  Result Value Ref Range   Glucose-Capillary 82 65 - 99 mg/dL  Basic metabolic panel     Status: Abnormal   Collection Time: 04/12/15  5:10 AM  Result Value Ref Range   Sodium 140 135 - 145 mmol/L   Potassium 3.6 3.5 - 5.1 mmol/L   Chloride 107 101 - 111 mmol/L   CO2 24 22 - 32 mmol/L   Glucose, Bld 86 65 - 99 mg/dL   BUN 34 (H) 6 - 20 mg/dL   Creatinine, Ser 1.41 (H) 0.61 - 1.24 mg/dL   Calcium 8.0 (L) 8.9 - 10.3 mg/dL   GFR calc non Af Amer 48 (L) >60 mL/min   GFR calc Af Amer 56 (L) >60 mL/min    Comment: (NOTE) The eGFR has been calculated using the CKD EPI equation. This calculation has not been validated in all clinical situations. eGFR's persistently <60 mL/min signify possible Chronic Kidney Disease.    Anion gap 9 5 - 15  CBC     Status: Abnormal   Collection Time: 04/12/15  5:10 AM  Result Value Ref Range   WBC 11.3 (H) 4.0 - 10.5 K/uL   RBC 4.28 4.22 - 5.81 MIL/uL   Hemoglobin 12.1 (L) 13.0 - 17.0 g/dL   HCT 36.8 (L) 39.0 - 52.0 %   MCV 86.0 78.0 - 100.0 fL   MCH 28.3 26.0 - 34.0 pg   MCHC 32.9 30.0 - 36.0 g/dL   RDW 15.0 11.5 - 15.5 %   Platelets 196 150 - 400 K/uL  Glucose, capillary     Status: Abnormal   Collection Time: 04/12/15  7:35 AM  Result Value Ref Range    Glucose-Capillary 104 (H) 65 - 99 mg/dL  Glucose, capillary     Status: Abnormal   Collection Time: 04/12/15 11:58 AM  Result Value Ref Range   Glucose-Capillary 116 (H) 65 - 99 mg/dL  Glucose, capillary     Status: Abnormal   Collection Time: 04/12/15  4:56 PM  Result Value Ref Range   Glucose-Capillary 114 (H) 65 - 99 mg/dL  Glucose, capillary     Status: Abnormal   Collection Time: 04/12/15  8:08 PM  Result Value Ref Range   Glucose-Capillary 210 (H) 65 - 99 mg/dL  Glucose, capillary     Status: Abnormal   Collection Time: 04/12/15 11:56 PM  Result Value Ref Range   Glucose-Capillary 113 (H) 65 - 99 mg/dL  Glucose, capillary     Status: Abnormal   Collection Time: 04/13/15  4:02 AM  Result Value Ref Range   Glucose-Capillary 105 (H) 65 - 99 mg/dL  Glucose, capillary     Status: Abnormal   Collection Time: 04/13/15  7:54 AM  Result Value Ref Range   Glucose-Capillary 118 (H) 65 - 99 mg/dL  CBC     Status: Abnormal   Collection Time: 04/13/15  7:57 AM  Result Value Ref Range  WBC 9.0 4.0 - 10.5 K/uL   RBC 4.19 (L) 4.22 - 5.81 MIL/uL   Hemoglobin 12.1 (L) 13.0 - 17.0 g/dL   HCT 36.6 (L) 39.0 - 52.0 %   MCV 87.4 78.0 - 100.0 fL   MCH 28.9 26.0 - 34.0 pg   MCHC 33.1 30.0 - 36.0 g/dL   RDW 14.9 11.5 - 15.5 %   Platelets 196 150 - 400 K/uL  Basic metabolic panel     Status: Abnormal   Collection Time: 04/13/15  7:57 AM  Result Value Ref Range   Sodium 142 135 - 145 mmol/L   Potassium 3.6 3.5 - 5.1 mmol/L   Chloride 108 101 - 111 mmol/L   CO2 25 22 - 32 mmol/L   Glucose, Bld 137 (H) 65 - 99 mg/dL   BUN 21 (H) 6 - 20 mg/dL   Creatinine, Ser 1.23 0.61 - 1.24 mg/dL   Calcium 8.2 (L) 8.9 - 10.3 mg/dL   GFR calc non Af Amer 57 (L) >60 mL/min   GFR calc Af Amer >60 >60 mL/min    Comment: (NOTE) The eGFR has been calculated using the CKD EPI equation. This calculation has not been validated in all clinical situations. eGFR's persistently <60 mL/min signify possible Chronic  Kidney Disease.    Anion gap 9 5 - 15  Glucose, capillary     Status: Abnormal   Collection Time: 04/13/15 11:56 AM  Result Value Ref Range   Glucose-Capillary 138 (H) 65 - 99 mg/dL  Glucose, capillary     Status: Abnormal   Collection Time: 04/13/15  4:50 PM  Result Value Ref Range   Glucose-Capillary 201 (H) 65 - 99 mg/dL  Glucose, capillary     Status: Abnormal   Collection Time: 04/13/15  8:02 PM  Result Value Ref Range   Glucose-Capillary 143 (H) 65 - 99 mg/dL  Basic metabolic panel     Status: Abnormal   Collection Time: 04/14/15  5:50 AM  Result Value Ref Range   Sodium 141 135 - 145 mmol/L   Potassium 3.6 3.5 - 5.1 mmol/L   Chloride 108 101 - 111 mmol/L   CO2 25 22 - 32 mmol/L   Glucose, Bld 162 (H) 65 - 99 mg/dL   BUN 17 6 - 20 mg/dL   Creatinine, Ser 1.08 0.61 - 1.24 mg/dL   Calcium 8.2 (L) 8.9 - 10.3 mg/dL   GFR calc non Af Amer >60 >60 mL/min   GFR calc Af Amer >60 >60 mL/min    Comment: (NOTE) The eGFR has been calculated using the CKD EPI equation. This calculation has not been validated in all clinical situations. eGFR's persistently <60 mL/min signify possible Chronic Kidney Disease.    Anion gap 8 5 - 15  CBC     Status: Abnormal   Collection Time: 04/14/15  5:50 AM  Result Value Ref Range   WBC 8.4 4.0 - 10.5 K/uL   RBC 4.08 (L) 4.22 - 5.81 MIL/uL   Hemoglobin 11.8 (L) 13.0 - 17.0 g/dL   HCT 35.4 (L) 39.0 - 52.0 %   MCV 86.8 78.0 - 100.0 fL   MCH 28.9 26.0 - 34.0 pg   MCHC 33.3 30.0 - 36.0 g/dL   RDW 14.5 11.5 - 15.5 %   Platelets 183 150 - 400 K/uL    No results found.  Discharge Exam: Blood pressure 161/56, pulse 66, temperature 98.5 F (36.9 C), temperature source Oral, resp. rate 18, height '5\' 7"'  (1.702 m),  weight 96.7 kg (213 lb 3 oz), SpO2 99 %.  General: Pt awake/alert/oriented x4 in no major acute distress Eyes: PERRL, normal EOM. Sclera nonicteric Neuro: CN II-XII intact w/o focal sensory/motor deficits. Lymph: No head/neck/groin  lymphadenopathy Psych:  No delerium/psychosis/paranoia HENT: Normocephalic, Mucus membranes moist.  No thrush Neck: Supple, No tracheal deviation Chest: No pain.  Good respiratory excursion. CV:  Pulses intact.  Regular rhythm MS: Normal AROM mjr joints.  No obvious deformity Abdomen: Soft, Nondistended.  Nontender.  No incarcerated hernias. GU:  MEMG Foley with clear yellow urine Ext:  SCDs BLE.  No significant edema.  No cyanosis Skin: No petechiae / purpura  Discharged Condition: good   Past Medical History  Diagnosis Date  . Hypertension   . Diabetes mellitus   . Hyperlipidemia   . Arthritis   . Vitamin D deficiency   . Adenocarcinoma in a polyp     Past Surgical History  Procedure Laterality Date  . Cardiovascular stress test  10/12/1999    EF 63%. NO ISCHEMIA  . Flexible sigmoidoscopy N/A 02/02/2015    Procedure: FLEXIBLE SIGMOIDOSCOPY;  Surgeon: Inda Castle, MD;  Location: [facility];  Service: Endoscopy;  Laterality: N/A;  ERBE  . Eus N/A 03/11/2015    Procedure: LOWER ENDOSCOPIC ULTRASOUND (EUS);  Surgeon: Milus Banister, MD;  Location: [facility];  Service: Endoscopy;  Laterality: N/A;  . Tonsillectomy and adenoidectomy      as child  . Abcess drainage Left     buttocks  . Colonoscopy w/ polypectomy      5 polyps  . Partial proctectomy by tem N/A 04/08/2015    Procedure: TEM PARTIAL PROCTECTOMY OF RECTAL MASS;  Surgeon: Michael Boston, MD;  Location: [facility];  Service: General;  Laterality: N/A;    History   Social History  . Marital Status: Divorced    Spouse Name: N/A  . Number of Children: 2  . Years of Education: N/A   Occupational History  . 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 Smoking status: Former Smoker    Types: Cigarettes    Quit date: 08/13/1994  . Smokeless tobacco: Never Used  . Alcohol Use: No  . Drug Use: No  . Sexual Activity: Not on file   Other Topics Concern  . Not on file   Social History Narrative    Family  History  Problem Relation Age of Onset  . Cancer Father     "all over"  . Coronary artery disease Brother   . Diabetes Brother   . Stroke Mother   . Diabetes Mother   . Colon cancer Neg Hx   . Esophageal cancer Neg Hx   . Rectal cancer Neg Hx   . Stomach cancer Neg Hx     Current Facility-Administered Medications  Medication Dose Route Frequency Provider Last Rate Last Dose  . 0.9 %  sodium chloride infusion  250 mL Intravenous PRN Michael Boston, MD      . acetaminophen (TYLENOL) tablet 325-650 mg  325-650 mg Oral Q6H PRN Michael Boston, MD      . alum & mag hydroxide-simeth (MAALOX/MYLANTA) 200-200-20 MG/5ML suspension 30 mL  30 mL Oral Q6H PRN Michael Boston, MD      . aspirin EC tablet 162 mg  162 mg Oral QHS Michael Boston, MD   162 mg at 04/13/15 2121  . bismuth subsalicylate (PEPTO BISMOL) 262 MG/15ML suspension 30 mL  30 mL Oral Q8H PRN Michael Boston, MD  30 mL at 04/12/15 1436  . diphenhydrAMINE (BENADRYL) injection 12.5-25 mg  12.5-25 mg Intravenous Q6H PRN Michael Boston, MD      . enoxaparin (LOVENOX) injection 40 mg  40 mg Subcutaneous Q24H Coralie Keens, MD   40 mg at 27/51/70 0174  . folic acid (FOLVITE) tablet 1 mg  1 mg Oral q morning - 10a Michael Boston, MD   1 mg at 04/13/15 1059  . insulin aspart (novoLOG) injection 0-15 Units  0-15 Units Subcutaneous TID WC Michael Boston, MD   5 Units at 04/13/15 1717  . lactated ringers bolus 1,000 mL  1,000 mL Intravenous Q8H PRN Michael Boston, MD      . lip balm (CARMEX) ointment 1 application  1 application Topical BID Michael Boston, MD   1 application at 94/49/67 2122  . magic mouthwash  15 mL Oral QID PRN Michael Boston, MD      . menthol-cetylpyridinium (CEPACOL) lozenge 3 mg  1 lozenge Oral PRN Michael Boston, MD      . metoprolol (LOPRESSOR) injection 5 mg  5 mg Intravenous Q6H PRN Michael Boston, MD      . metoprolol succinate (TOPROL-XL) 24 hr tablet 25 mg  25 mg Oral Daily Michael Boston, MD   25 mg at 04/13/15 1059  . metoprolol tartrate  (LOPRESSOR) tablet 12.5 mg  12.5 mg Oral Q12H PRN Michael Boston, MD   12.5 mg at 04/12/15 0929  . ondansetron (ZOFRAN) injection 4 mg  4 mg Intravenous Q6H PRN Coralie Keens, MD      . ondansetron [facility]) tablet 4 mg  4 mg Oral Q6H PRN Michael Boston, MD      . oxyCODONE (Oxy IR/ROXICODONE) immediate release tablet 5-10 mg  5-10 mg Oral Q6H PRN Coralie Keens, MD      . pantoprazole (PROTONIX) injection 40 mg  40 mg Intravenous QHS Coralie Keens, MD   40 mg at 04/13/15 2121  . phenol (CHLORASEPTIC) mouth spray 2 spray  2 spray Mouth/Throat PRN Michael Boston, MD      . promethazine (PHENERGAN) injection 6.25-12.5 mg  6.25-12.5 mg Intravenous Q4H PRN Michael Boston, MD      . psyllium (HYDROCIL/METAMUCIL) packet 1 packet  1 packet Oral Daily Michael Boston, MD   1 packet at 04/13/15 1059  . saccharomyces boulardii (FLORASTOR) capsule 250 mg  250 mg Oral BID Michael Boston, MD   250 mg at 04/13/15 2121  . sodium chloride 0.9 % injection 3 mL  3 mL Intravenous Q12H Michael Boston, MD   3 mL at 04/13/15 2122  . sodium chloride 0.9 % injection 3 mL  3 mL Intravenous PRN Michael Boston, MD         No Known Allergies  Disposition: 01-Home or Self Care  Discharge Instructions    Call MD for:  extreme fatigue    Complete by:  As directed      Call MD for:  hives    Complete by:  As directed      Call MD for:  persistant nausea and vomiting    Complete by:  As directed      Call MD for:  redness, tenderness, or signs of infection (pain, swelling, redness, odor or green/yellow discharge around incision site)    Complete by:  As directed      Call MD for:  severe uncontrolled pain    Complete by:  As directed      Call MD for:    Complete by:  As directed   Temperature > 101.69F     Diet - low sodium heart healthy    Complete by:  As directed      Discharge instructions    Complete by:  As directed   Please see discharge instruction sheets.  Also refer to handout given an office.  Please call our  office if you have any questions or concerns [phone]     Discharge wound care:    Complete by:  As directed   If you have closed incisions, shower and bathe over these incisions with soap and water every day.  Remove all surgical dressings on postoperative day #3.  You do not need to replace dressings over the closed incisions unless you feel more comfortable with a Band-Aid covering it.   If you have an open wound that requires packing, please see wound care instructions.  In general, remove all dressings, wash wound with soap and water and then replace with saline moistened gauze.  Do the dressing change at least every day.  Please call our office [phone] if you have further questions.     Driving Restrictions    Complete by:  As directed   No driving until off narcotics and can safely swerve away without pain during an emergency     Increase activity slowly    Complete by:  As directed   Walk an hour a day.  Use 20-30 minute walks.  When you can walk 30 minutes without difficulty, it is fine to restart low impact/moderate activities such as biking, jogging, swimming, sexual activity, etc.  Eventually you can increase to unrestricted activity when not feeling pain.  If you feel pain: STOP!Marland Kitchen   Let pain protect you from overdoing it.  Use ice/heat & over-the-counter pain medications to help minimize soreness.  If that is not enough, then use your narcotic pain prescription as needed to remain active.  It is better to take extra pain medications and be more active than to stay bedridden to avoid all pain medications.     Lifting restrictions    Complete by:  As directed   Avoid heavy lifting initially.  Do not push through pain.  You have no specific weight limit - if it hurts to do, DON'T DO IT.   If you feel no pain, you are not injuring anything.  Pain will protect you from injury.  Coughing and sneezing are far more stressful to your incision than any lifting.  Avoid resuming heavy  lifting / intense activity until off all narcotic pain medications.  When ready to exercise more, give yourself 2 weeks to gradually get back to full intense exercise/activity.     May shower / Bathe    Complete by:  As directed      May walk up steps    Complete by:  As directed      Sexual Activity Restrictions    Complete by:  As directed   Sexual activity as tolerated.  Do not push through pain.  Pain will protect you from injury.     Walk with assistance    Complete by:  As directed   Walk over an hour a day.  May use a walker/cane/companion to help with balance and stamina.            Medication List    TAKE these medications        aspirin 81 MG tablet  Take 162 mg by mouth at bedtime.  folic acid 1 MG tablet  Commonly known as:  FOLVITE  Take 1 mg by mouth every morning.     HUMALOG KWIKPEN 100 UNIT/ML KiwkPen  Generic drug:  insulin lispro  Inject 2-6 Units into the skin 3 (three) times daily as needed (high blood sugar). Sliding scale     insulin glargine 100 UNIT/ML injection  Commonly known as:  LANTUS  Inject 75 Units into the skin 2 (two) times daily.     losartan-hydrochlorothiazide 100-25 MG per tablet  Commonly known as:  HYZAAR  TAKE 1 TABLET DAILY (PATIENT NEEDS TO CALL OFFICE TO SCHEDULE FOLLOW UP APPOINTMENT)     methotrexate 2.5 MG tablet  Commonly known as:  RHEUMATREX  Take 20 mg by mouth every Friday.     metoprolol succinate 50 MG 24 hr tablet  Commonly known as:  TOPROL-XL  Take 1 tablet (50 mg total) by mouth daily. Take with or immediately following a meal.     oxyCODONE 5 MG immediate release tablet  Commonly known as:  Oxy IR/ROXICODONE  Take 1-2 tablets (5-10 mg total) by mouth every 6 (six) hours as needed for moderate pain, severe pain or breakthrough pain.     rosuvastatin 20 MG tablet  Commonly known as:  CRESTOR  Take 20 mg by mouth at bedtime.           Follow-up Information    Follow up with Willadene Mounsey C., MD.  Schedule an appointment as soon as possible for a visit in 2 weeks.   Specialty:  General Surgery   Why:  To follow up after your operation, To follow up after your hospital stay   Contact information:   [place] [id number] [phone]        Signed: Morton Peters, M.D., F.A.C.S. Gastrointestinal and Minimally Invasive Surgery [facility] [address] [place], [place] [address] [phone] Main / Paging   04/14/2015, 7:16 AM

## 2015-04-14 NOTE — Discharge Instructions (Signed)
Acute Urinary Retention Acute urinary retention is the temporary inability to urinate. This is a common problem in older men. As men age their prostates become larger and block the flow of urine from the bladder. This is usually a problem that has come on gradually.  HOME CARE INSTRUCTIONS If you are sent home with a Foley catheter and a drainage system, you will need to discuss the best course of action with your health care provider. While the catheter is in, maintain a good intake of fluids. Keep the drainage bag emptied and lower than your catheter. This is so that contaminated urine will not flow back into your bladder, which could lead to a urinary tract infection. There are two main types of drainage bags. One is a large bag that usually is used at night. It has a good capacity that will allow you to sleep through the night without having to empty it. The second type is called a leg bag. It has a smaller capacity, so it needs to be emptied more frequently. However, the main advantage is that it can be attached by a leg strap and can go underneath your clothing, allowing you the freedom to move about or leave your home. Only take over-the-counter or prescription medicines for pain, discomfort, or fever as directed by your health care provider.  SEEK MEDICAL CARE IF:  You develop a low-grade fever.  You experience spasms or leakage of urine with the spasms. SEEK IMMEDIATE MEDICAL CARE IF:   You develop chills or fever.  Your catheter stops draining urine.  Your catheter falls out.  You start to develop increased bleeding that does not respond to rest and increased fluid intake. MAKE SURE YOU:  Understand these instructions.  Will watch your condition.  Will get help right away if you are not doing well or get worse. Document Released: 01/08/2001 Document Revised: 10/07/2013 Document Reviewed: 03/13/2013 [facility] Patient Information 2015 [place], [place]. This information is not  intended to replace advice given to you by your health care provider. Make sure you discuss any questions you have with your health care provider.   TRANSANAL ENDOSCOPIC MICROSURGERY (TEM)  Transanal endoscopic microsurgery (TEM) was developed as a means to provide a less invasive way to operate on the rectum.  This may needed to provide a good resection of part of the rectal wall to remove a large pre-cancerous polyp or an early cancer in which the patient cannot tolerate an open surgery or has an extremely hostile abdomen that makes the resection very risky.    The rectum is the last foot of the tubular digestive tract.  It resides in the pelvis, laying on top of your tailbone.  It is the final reservoir for stool before it is evacuated through the anus in the process of defecation, naturally stretching and contracting to allow time for someone to control bowel function.  The rectum is a common location for polyps or even a cancer.    In most cases when a polyp is found in the colon or rectum, a person often does not need to have a large resection of the rectum, but have it excised by a colonoscope.  However, some polyps are too large to be safely removed through endoscopy and require surgery.  Classically, this is done through an open incision where a low anterior resection or abdominoperineal resection where part or the entire rectum is removed.  Often, only part of a wall of the rectum needs to be removed.  Transanal endoscopic microsurgery (TEM) was developed as a means to provide a less invasive way to operate on the rectum.    Transanal endoscopic microsurgery (TEM) involves the patient to be placed under complete general anesthesia.  The anus is gently dilated and a metal tube is placed into the rectum.  Through the tube, absorbable gas is infused and long instruments are used to help access and cut out the polyp or tumor from part of the rectum.  The long instruments are also used to help sew  the wound shut.  The specimen is then sent for pathology.  The procedure itself usually takes a few hours of time.  The patient stays at least overnight.  When they can tolerate a regular diet and have adequate pain control they usually leave the hospital in one or two days.    The advantage of TEM is that as opposed to a long hospital stay, patient recovery is much faster.  People  less likely to have bowel or other problems.  Careful pre-operative selection is essential to make sure that the patient is an appropriate candidate for the surgery.  Tumors or cancers that are very large or invasive usually are much more difficult to remove by this technique and are not considered the first option.  Persons who are most appropriate for this surgery are those with large polyps that have not become cancers or early cancers in patients who have high risks with larger surgery.   Sometimes a more aggressive laparoscopic or open follow up resection is needed if a cancer is found to give the best chance at pure.    In general, surgery has a better chance at cure if a cancer is found but may not needed if it is only a precancerous polyp.  Risks to the surgery such as pain, bleeding, abscess, leak, ostomy, and death are inherent but overall the TEM procedure is less stressful and less risky to the patient than a classic colorectal resection throughthe abdomen.  Your colorectal surgeon can help decide what option is best for you.  SURGERY: POST OP INSTRUCTIONS (Surgery for small bowel obstruction, colon resection, etc)   1. DIET: Follow a light bland diet the first 24 hours after arrival home, such as soup, liquids, crackers, etc.  Be sure to include lots of fluids daily.  Avoid fast food or heavy meals as your are more likely to get nauseated.  Stay on a low fat diet the next few days after surgery.  Gradually add a fiber supplement to your diet over the next week.   Your should try to eat a low-fat, high fiber diet the  rest of your life thereafter (See Below).   2. Take your usually prescribed home medications unless otherwise directed.  OK to take aspirin.    If you are on strong blood thinners (warfarin/Coumadin, Plavix, Xerelto, Eliquis, etc), discuss with your surgeon, medicine PCP, and/or cardiologist for instructions on when to restart the blood thinner & if blood monitoring is needed (PT/INR blood check, etc)  3. PAIN CONTROL:  Pain after surgery or related to activity is often due to strain/injury to muscle, tendon, nerves and/or incisions.  This pain is usually short-term and will improve in a few months.   Many people find it helpful to do the following things TOGETHER to help speed the process of healing and to get back to regular activity more quickly:  1. Avoid heavy physical activity at first a. No lifting greater than 20  pounds at first, then increase to lifting as tolerated over the next few weeks b. Do not push through the pain.  Listen to your body and avoid positions and maneuvers than reproduce the pain.  Wait a few days before trying something more intense c. Walking is okay as tolerated, but go slowly and stop when getting sore.  If you can walk 30 minutes without stopping or pain, you can try more intense activity (running, jogging, aerobics, cycling, swimming, treadmill, sex, sports, weightlifting, etc ) d. Remember: If it hurts to do it, then dont do it!  2. Take Anti-inflammatory medication a. Choose ONE of the following over-the-counter medications: i.            Acetaminophen 500mg  tabs (Tylenol) 1-2 pills with every meal and just before bedtime (avoid if you have liver problems) ii.            Naproxen 220mg  tabs (ex. Aleve) 1-2 pills twice a day (avoid if you have kidney, stomach, IBD, or bleeding problems) iii. Ibuprofen 200mg  tabs (ex. Advil, Motrin) 3-4 pills with every meal and just before bedtime (avoid if you have kidney, stomach, IBD, or bleeding problems) b. Take with  food/snack around the clock for 1-2 weeks i. This helps the muscle and nerve tissues become less irritable and calm down faster  3. Use a Heating pad or Ice/Cold Pack a. Most patients will experience some swelling and bruising around the incisions.  Swelling and bruising can take several weeks to resolve. i. Ice packs or heating pads (30-60 minutes up to 6 times a day) will help. ii. Use ice for the first few days to help decrease swelling and bruising iii. Switch to heat to help relax tight/sore spots and speed recovery.  iv. Some people prefer to use ice alone, heat alone, alternating between ice & heat.  Experiment to what works for you a. May use warm bath/hottub  or showers  4. Try Gentle Massage and/or Stretching  a. at the area of pain many times a day b. stop if you feel pain - do not overdo it 5. Prescription for pain medication (such as oxycodone, hydrocodone, etc) should be given to you upon discharge.  Take your pain medication as prescribed. a. If you are having problems/concerns with the prescription medicine (does not control pain, nausea, vomiting, rash, itching, etc), please call us [phone] to see if we need to switch you to a different pain medicine that will work better for you and/or control your side effect better. b. If you need a refill on your pain medication, please contact your pharmacy.  They will contact our office to request authorization. Prescriptions will not be filled after 5 pm or on week-ends. c.  Try these steps together to help you body heal faster and avoid making things get worse.  Doing just one of these things may not be enough.    If you are not getting better after two weeks or are noticing you are getting worse, contact our office for further advice; we may need to re-evaluate you & see what other things we can do to help.  i.  GETTING TO GOOD BOWEL HEALTH. Irregular bowel habits such as constipation and diarrhea can lead to many problems  over time.  Having one soft bowel movement a day is the most important way to prevent further problems.  The anorectal canal is designed to handle stretching and feces to safely manage our ability to get rid of solid waste (  feces, poop, stool) out of our body.  BUT, hard constipated stools can act like ripping concrete bricks and diarrhea can be a burning fire to this very sensitive area of our body, causing inflamed hemorrhoids, anal fissures, increasing risk is perirectal abscesses, abdominal pain/bloating, an making irritable bowel worse.      The goal: ONE SOFT BOWEL MOVEMENT A DAY!  To have soft, regular bowel movements:   Drink plenty of fluids, consider 4-6 tall glasses of water a day.    Take plenty of fiber.  Fiber is the undigested part of plant food that passes into the colon, acting s natures broom to encourage bowel motility and movement.  Fiber can absorb and hold large amounts of water. This results in a larger, bulkier stool, which is soft and easier to pass. Work gradually over several weeks up to 6 servings a day of fiber (25g a day even more if needed) in the form of: o Vegetables -- Root (potatoes, carrots, turnips), leafy green (lettuce, salad greens, celery, spinach), or cooked high residue (cabbage, broccoli, etc) o Fruit -- Fresh (unpeeled skin & pulp), Dried (prunes, apricots, cherries, etc ),  or stewed ( applesauce)  o Whole grain breads, pasta, etc (whole wheat)  o Bran cereals   Bulking Agents -- This type of water-retaining fiber generally is easily obtained each day by one of the following:  o Psyllium bran -- The psyllium plant is remarkable because its ground seeds can retain so much water. This product is available as Metamucil, Konsyl, Effersyllium, Per Diem Fiber, or the less expensive generic preparation in drug and health food stores. Although labeled a laxative, it really is not a laxative.  o Methylcellulose -- This is another fiber derived from wood which also  retains water. It is available as Citrucel. o Polyethylene Glycol - and artificial fiber commonly called Miralax or Glycolax.  It is helpful for people with gassy or bloated feelings with regular fiber o Flax Seed - a less gassy fiber than psyllium  No reading or other relaxing activity while on the toilet. If bowel movements take longer than 5 minutes, you are too constipated  AVOID CONSTIPATION.  High fiber and water intake usually takes care of this.  Sometimes a laxative is needed to stimulate more frequent bowel movements, but   Laxatives are not a good long-term solution as it can wear the colon out.  They can help jump-start bowels if constipated, but should be relied on constantly without discussing with your doctor o Osmotics (Milk of Magnesia, Fleets phosphosoda, Magnesium citrate, MiraLax, GoLytely) are safer than  o Stimulants (Senokot, Castor Oil, Dulcolax, Ex Lax)    o Avoid taking laxatives for more than 7 days in a row.   IF SEVERELY CONSTIPATED, try a Bowel Retraining Program: o Do not use laxatives.  o Eat a diet high in roughage, such as bran cereals and leafy vegetables.  o Drink six (6) ounces of prune or apricot juice each morning.  o Eat two (2) large servings of stewed fruit each day.  o Take one (1) heaping tablespoon of a psyllium-based bulking agent twice a day. Use sugar-free sweetener when possible to avoid excessive calories.  o Eat a normal breakfast.  o Set aside 15 minutes after breakfast to sit on the toilet, but do not strain to have a bowel movement.  o If you do not have a bowel movement by the third day, use an enema and repeat the above steps.  Controlling diarrhea o Switch to liquids and simpler foods for a few days to avoid stressing your intestines further. o Avoid dairy products (especially milk & ice cream) for a short time.  The intestines often can lose the ability to digest lactose when stressed. o Avoid foods that cause gassiness or  bloating.  Typical foods include beans and other legumes, cabbage, broccoli, and dairy foods.  Every person has some sensitivity to other foods, so listen to our body and avoid those foods that trigger problems for you. o Adding fiber (Citrucel, Metamucil, psyllium, Miralax) gradually can help thicken stools by absorbing excess fluid and retrain the intestines to act more normally.  Slowly increase the dose over a few weeks.  Too much fiber too soon can backfire and cause cramping & bloating. o Probiotics (such as active yogurt, Align, etc) may help repopulate the intestines and colon with normal bacteria and calm down a sensitive digestive tract.  Most studies show it to be of mild help, though, and such products can be costly. o Medicines: - Bismuth subsalicylate (ex. Kayopectate, Pepto Bismol) every 30 minutes for up to 6 doses can help control diarrhea.  Avoid if pregnant. - Loperamide (Immodium) can slow down diarrhea.  Start with two tablets (4mg  total) first and then try one tablet every 6 hours.  Avoid if you are having fevers or severe pain.  If you are not better or start feeling worse, stop all medicines and call your doctor for advice o Call your doctor if you are getting worse or not better.  Sometimes further testing (cultures, endoscopy, X-ray studies, bloodwork, etc) may be needed to help diagnose and treat the cause of the diarrhea.  TROUBLESHOOTING IRREGULAR BOWELS 1) Avoid extremes of bowel movements (no bad constipation/diarrhea) 2) Miralax 17gm mixed in 8oz. water or juice-daily. May use BID as needed.  3) Gas-x,Phazyme, etc. as needed for gas & bloating.  4) Soft,bland diet. No spicy,greasy,fried foods.  5) Prilosec over-the-counter as needed  6) May hold gluten/wheat products from diet to see if symptoms improve.  7)  May try probiotics (Align, Activa, etc) to help calm the bowels down 7) If symptoms become worse call back immediately.   4. Wash / shower every day.  You may  shower over the incision / wound.  Avoid baths until 5 days after surgery.  Continue to shower over incision(s) after the dressing is off.  5. Remove your waterproof bandages 5 days after surgery.  You may leave the incision open to air.  Remove any wicks or ribbons in your wound.  If you have an open wound, please see wound care instructions. You may replace a dressing/Band-Aid to cover the incision for comfort if you wish.  6. ACTIVITIES as tolerated:   a. You may resume regular (light) daily activities beginning the next day--such as daily self-care, walking, climbing stairs--gradually increasing activities as tolerated.  If you can walk 30 minutes without difficulty, it is safe to try more intense activity such as jogging, treadmill, bicycling, low-impact aerobics, swimming, etc. b. Save the most intensive and strenuous activity for last (Usually 3-6 weeks after surgery) such as sit-ups, heavy lifting, contact sports, etc  Refrain from any heavy lifting or straining until you are off narcotics for pain control.   c. DO NOT PUSH THROUGH PAIN.  Let pain be your guide: If it hurts to do something, don't do it.  Pain is your body warning you to avoid that activity for another week until the  pain goes down. d. You may drive when you are no longer taking prescription pain medication, you can comfortably wear a seatbelt, and you can safely maneuver your car and apply brakes. e. Dennis Bast may have sexual intercourse when it is comfortable. If it hurts to do something, don't do it.  7. FOLLOW UP in our office a. Please call [facility] at [phone] to set up an appointment to see your surgeon in the office for a follow-up appointment approximately 2-3 weeks after your surgery. b. Make sure that you call for this appointment the day you arrive home to insure a convenient appointment time.  8. IF YOU HAVE DISABILITY OR FAMILY LEAVE FORMS, BRING THEM TO THE OFFICE FOR PROCESSING.  DO NOT GIVE THEM TO YOUR  DOCTOR.   WHEN TO CALL us [phone]: 1. Poor pain control 2. Reactions / problems with new medications (rash/itching, nausea, etc)  3. Fever over 101.5 F (38.5 C) 4. Inability to urinate 5. Nausea and/or vomiting 6. Worsening swelling or bruising 7. Continued bleeding from incision. 8. Increased pain, redness, or drainage from the incision  The clinic staff is available to answer your questions during regular business hours (8:30am-5pm).  Please dont hesitate to call and ask to speak to one of our nurses for clinical concerns.   A surgeon from [facility] is always on call at the hospitals   If you have a medical emergency, go to the nearest emergency room or call 911.    [facility], [place], [place], [place], [place]  [address] ? MAIN: [phone] ? TOLL FREE: [phone] ? FAX [phone] [email or web address]

## 2015-04-14 NOTE — Progress Notes (Signed)
Discharge instructions given to patient.  Along with foley cath and leg bag.  Questions ansered

## 2015-04-14 NOTE — Progress Notes (Signed)
Pt agreeable to reinsertion of foley cath per MD order.  14 fr inserted in sterile fashion, with Lanelle Bal NT as witness.  Pt tolerated well.  350 mL of urine obtained. Leg strap applied.  Pt reports pressure is better in lower ABD.  Printed Exit Care education about Foley Catheters for pt to review prior to cath care with RN. Teachback used for cath care.  Reviewed how to empty bag, changing bags, s/s infection, when to call MD, leg strap attachment.  Pt verbalized understanding and asked appropriate questions.

## 2015-04-17 LAB — CULTURE, BLOOD (ROUTINE X 2)
Culture: NO GROWTH
Culture: NO GROWTH

## 2015-04-23 DIAGNOSIS — R3912 Poor urinary stream: Secondary | ICD-10-CM | POA: Diagnosis not present

## 2015-04-23 DIAGNOSIS — R351 Nocturia: Secondary | ICD-10-CM | POA: Diagnosis not present

## 2015-04-27 DIAGNOSIS — R3912 Poor urinary stream: Secondary | ICD-10-CM | POA: Diagnosis not present

## 2015-04-30 DIAGNOSIS — N390 Urinary tract infection, site not specified: Secondary | ICD-10-CM | POA: Diagnosis not present

## 2015-04-30 DIAGNOSIS — R3912 Poor urinary stream: Secondary | ICD-10-CM | POA: Diagnosis not present

## 2015-05-04 DIAGNOSIS — R3912 Poor urinary stream: Secondary | ICD-10-CM | POA: Diagnosis not present

## 2015-06-17 DIAGNOSIS — R339 Retention of urine, unspecified: Secondary | ICD-10-CM | POA: Diagnosis not present

## 2015-06-17 DIAGNOSIS — R3912 Poor urinary stream: Secondary | ICD-10-CM | POA: Diagnosis not present

## 2015-06-17 DIAGNOSIS — R351 Nocturia: Secondary | ICD-10-CM | POA: Diagnosis not present

## 2015-06-23 IMAGING — CT CT CHEST W/ CM
2 of 3 series · 14 of 31 positions shown, 16 images · IV contrast (75CC ISOVUE 300)
Comparison: None available; correlation CT abdomen and pelvis
02/15/2015

CLINICAL DATA: New diagnosis colon cancer

EXAM:
CT CHEST WITH CONTRAST
TECHNIQUE: Multidetector CT imaging of the chest was performed during
intravenous contrast administration. Sagittal and coronal MPR images
reconstructed from axial data set.
CONTRAST:  75 cc Isovue 300 IV.

[Series 3: chest with · axial · 0.77mm/px · z∈[-283,-53]mm · 6 of 66 slices shown, 8 images]
[im 10/66  mediastinal]
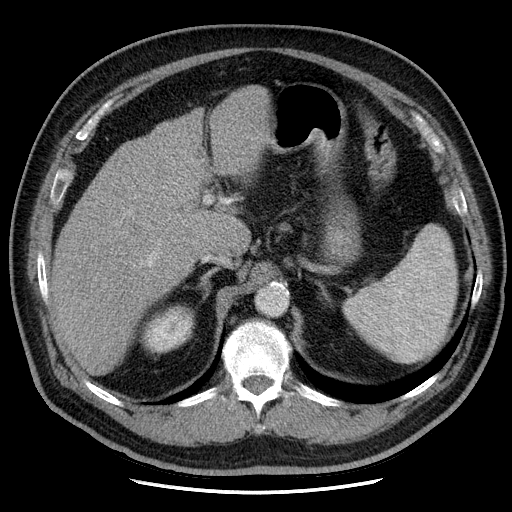
[im 10/66  lung]
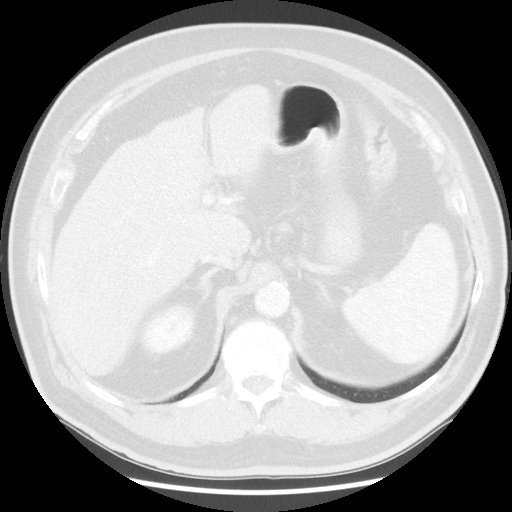
[im 19/66  lung]
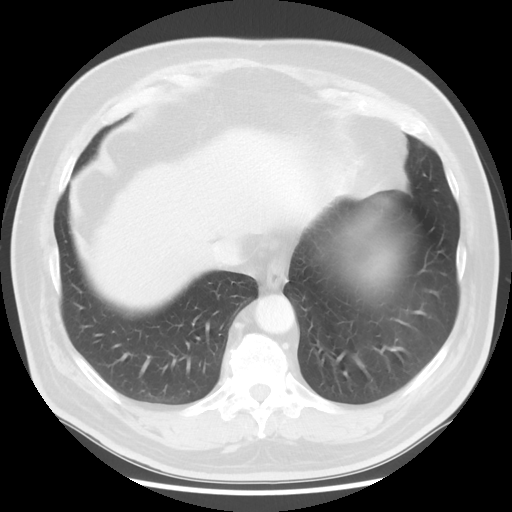
[im 28/66  lung]
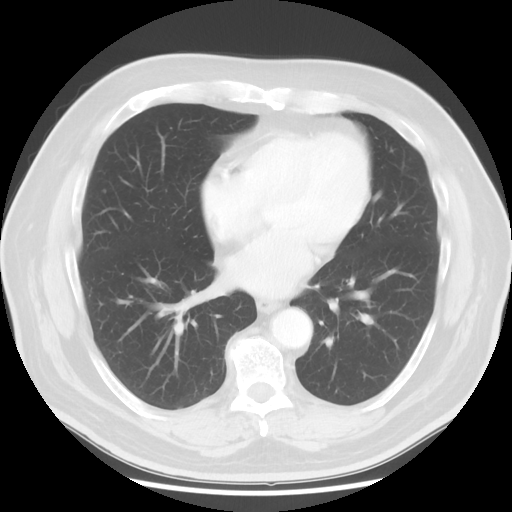
[im 38/66  lung]
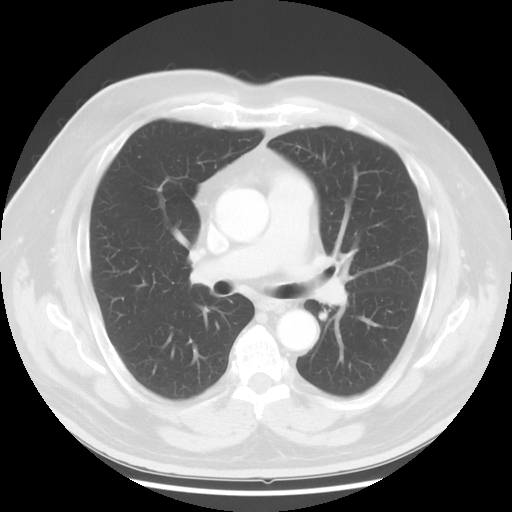
[im 47/66  mediastinal]
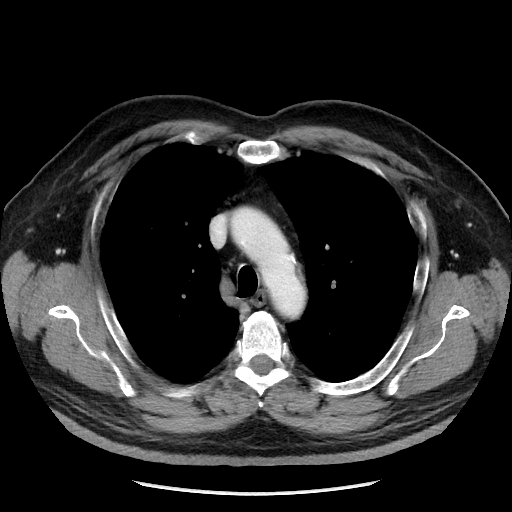
[im 47/66  lung]
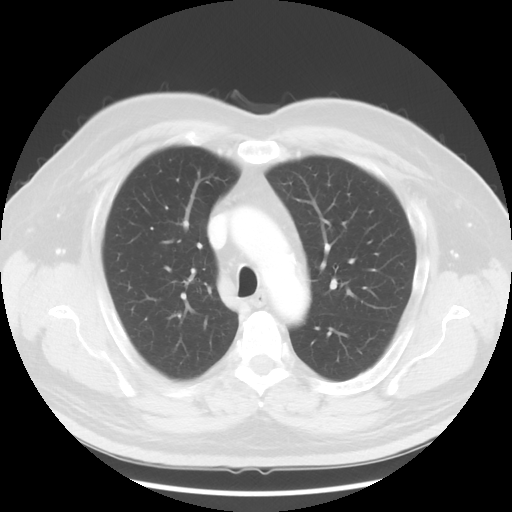
[im 56/66  lung]
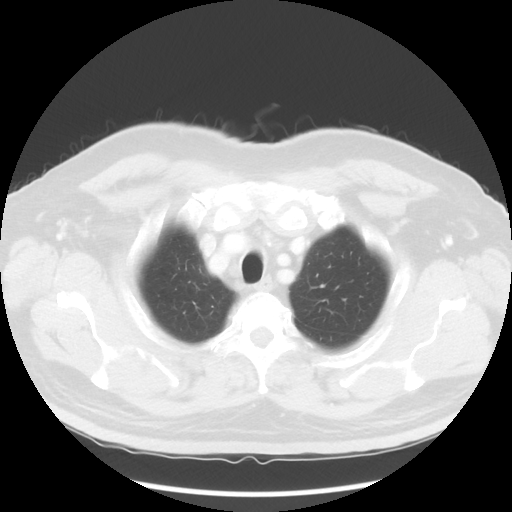

[Series 602: sagittal body · sagittal · 0.77mm/px · 8 of 159 slices shown]
[im 17/159  mediastinal]
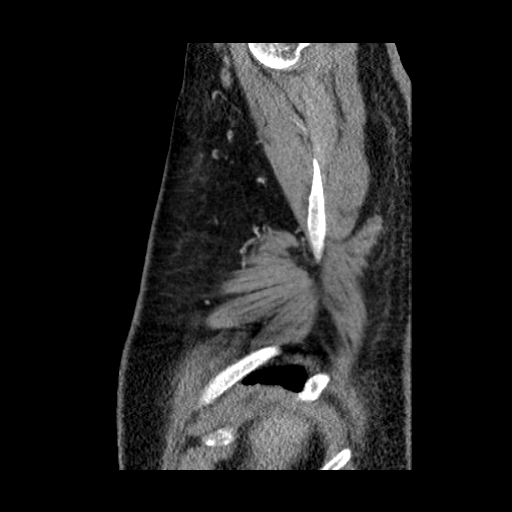
[im 34/159  mediastinal]
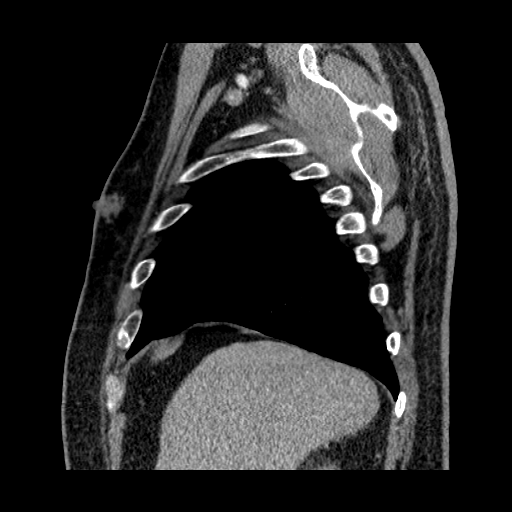
[im 50/159  mediastinal]
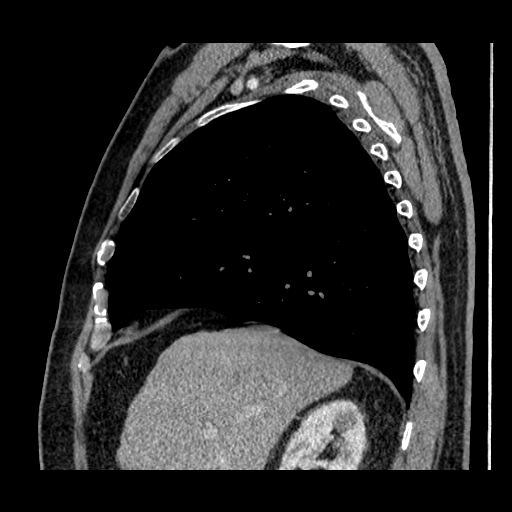
[im 75/159  mediastinal]
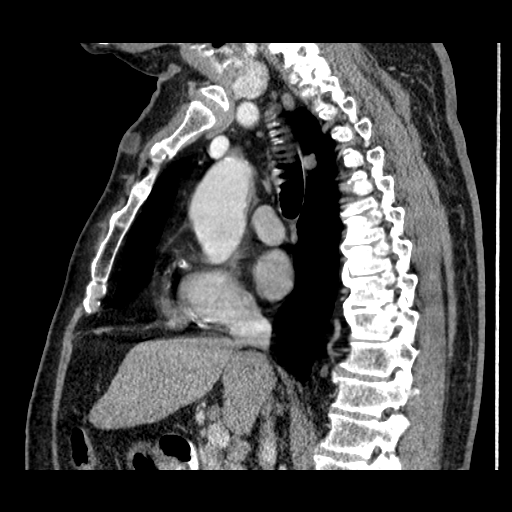
[im 84/159  mediastinal]
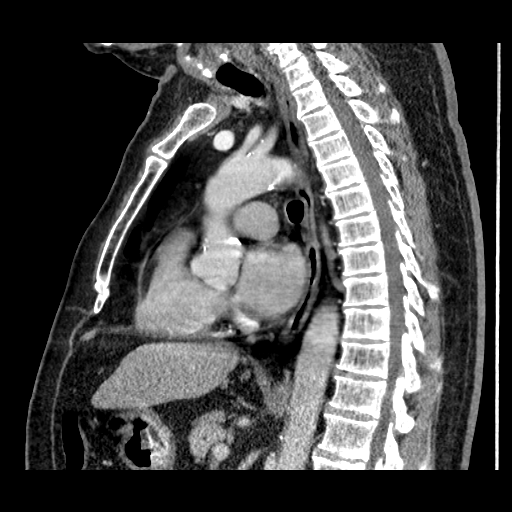
[im 109/159  mediastinal]
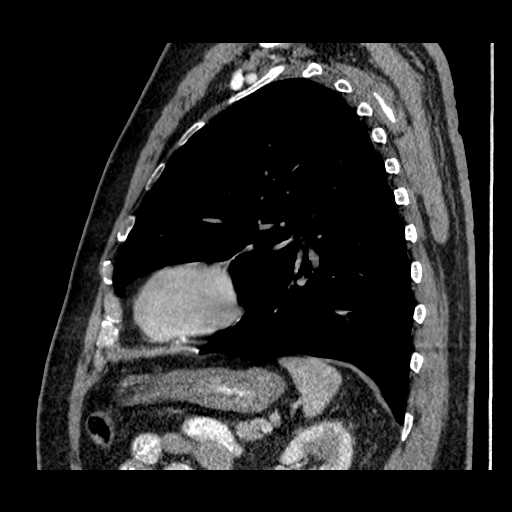
[im 125/159  mediastinal]
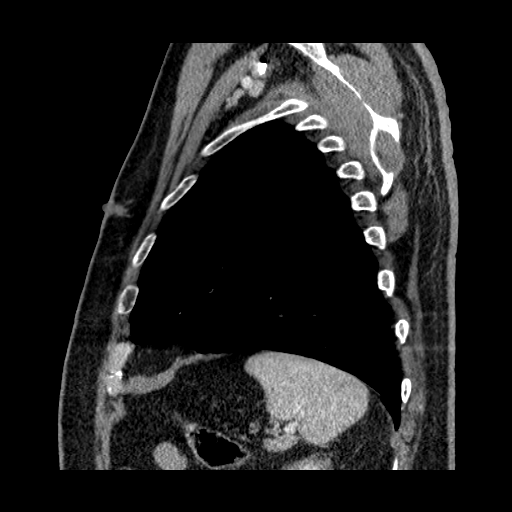
[im 142/159  mediastinal]
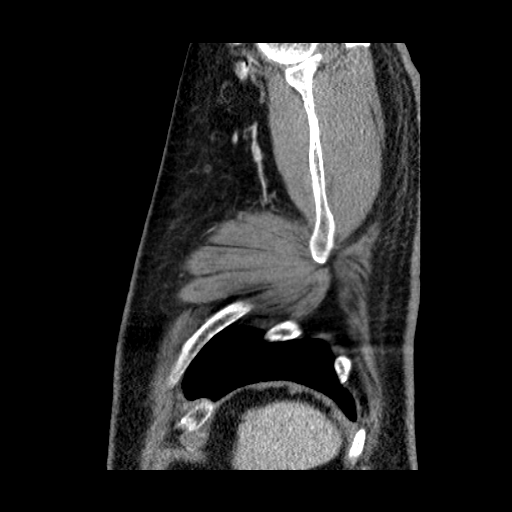

[14 of 31 positions shown; findings below may reference images not displayed]

FINDINGS: Scattered atherosclerotic calcifications aorta and coronary
arteries. Aneurysmal dilatation ascending thoracic aorta,
recommendation below.

Aneurysmal dilatation ascending thoracic aorta 4.4 x 4.6 cm.

No thoracic adenopathy.

Borderline enlarged peripancreatic lymph node 12 mm short axis image
59 previously 11 mm.

Cholelithiasis.

Remaining visualized portion of upper abdomen unremarkable.

cyst versus epidermal inclusion cyst.

Minimal gynecomastia.

2 mm RIGHT middle lobe nodule image 29.

4 mm RIGHT middle lobe nodule image 39.

Remaining lungs clear.

No infiltrate, pleural effusion, pneumothorax or additional
mass/nodule.

No acute osseous findings.
IMPRESSION: Nonspecific RIGHT middle lobe nodules 2 mm and 4 mm in diameters,
recommend attention on follow-up imaging.

Cholelithiasis.

Ascending thoracic aortic aneurysm. Recommend semi-annual imaging
followup by CTA or MRA and referral to cardiothoracic surgery if not
already obtained. This recommendation follows 3414
ACCF/AHA/AATS/ACR/ASA/SCA/MATOCA/HUDDON/SANJUAN/JUMPER Guidelines for the
Diagnosis and Management of Patients With Thoracic Aortic Disease.
Circulation. 3414; 121: [id number]

## 2015-09-14 ENCOUNTER — Ambulatory Visit (INDEPENDENT_AMBULATORY_CARE_PROVIDER_SITE_OTHER): Payer: Medicare Other | Admitting: Internal Medicine

## 2015-09-14 ENCOUNTER — Ambulatory Visit (INDEPENDENT_AMBULATORY_CARE_PROVIDER_SITE_OTHER)
Admission: RE | Admit: 2015-09-14 | Discharge: 2015-09-14 | Disposition: A | Discharge status: Home / Self Care | Payer: Medicare Other | Source: Ambulatory Visit | Attending: Internal Medicine | Admitting: Internal Medicine

## 2015-09-14 ENCOUNTER — Encounter: Payer: Self-pay | Admitting: Internal Medicine

## 2015-09-14 VITALS — BP 166/90 | HR 68 | Temp 98.2°F | Ht 67.0 in | Wt 225.0 lb

## 2015-09-14 DIAGNOSIS — E119 Type 2 diabetes mellitus without complications: Secondary | ICD-10-CM

## 2015-09-14 DIAGNOSIS — M25552 Pain in left hip: Secondary | ICD-10-CM | POA: Diagnosis not present

## 2015-09-14 DIAGNOSIS — Z794 Long term (current) use of insulin: Secondary | ICD-10-CM

## 2015-09-14 DIAGNOSIS — M25551 Pain in right hip: Secondary | ICD-10-CM | POA: Diagnosis not present

## 2015-09-14 DIAGNOSIS — E1165 Type 2 diabetes mellitus with hyperglycemia: Secondary | ICD-10-CM | POA: Diagnosis not present

## 2015-09-14 DIAGNOSIS — C801 Malignant (primary) neoplasm, unspecified: Secondary | ICD-10-CM

## 2015-09-14 DIAGNOSIS — M25559 Pain in unspecified hip: Secondary | ICD-10-CM | POA: Diagnosis not present

## 2015-09-14 DIAGNOSIS — R7309 Other abnormal glucose: Secondary | ICD-10-CM | POA: Diagnosis not present

## 2015-09-14 DIAGNOSIS — I10 Essential (primary) hypertension: Secondary | ICD-10-CM

## 2015-09-14 DIAGNOSIS — E785 Hyperlipidemia, unspecified: Secondary | ICD-10-CM | POA: Insufficient documentation

## 2015-09-14 DIAGNOSIS — M1990 Unspecified osteoarthritis, unspecified site: Secondary | ICD-10-CM

## 2015-09-14 LAB — CBC WITH DIFFERENTIAL/PLATELET
BASOS ABS: 0 10*3/uL (ref 0.0–0.1)
Basophils Relative: 0 % (ref 0–1)
EOS ABS: 0.3 10*3/uL (ref 0.0–0.7)
EOS PCT: 3 % (ref 0–5)
HCT: 43.2 % (ref 39.0–52.0)
Hemoglobin: 14.3 g/dL (ref 13.0–17.0)
Lymphocytes Relative: 23 % (ref 12–46)
Lymphs Abs: 2.2 10*3/uL (ref 0.7–4.0)
MCH: 28.9 pg (ref 26.0–34.0)
MCHC: 33.1 g/dL (ref 30.0–36.0)
MCV: 87.3 fL (ref 78.0–100.0)
MPV: 11.5 fL (ref 8.6–12.4)
Monocytes Absolute: 1.1 10*3/uL — ABNORMAL HIGH (ref 0.1–1.0)
Monocytes Relative: 11 % (ref 3–12)
NEUTROS PCT: 63 % (ref 43–77)
Neutro Abs: 6 10*3/uL (ref 1.7–7.7)
PLATELETS: 227 10*3/uL (ref 150–400)
RBC: 4.95 MIL/uL (ref 4.22–5.81)
RDW: 15.9 % — AB (ref 11.5–15.5)
WBC: 9.6 10*3/uL (ref 4.0–10.5)

## 2015-09-14 LAB — COMPREHENSIVE METABOLIC PANEL
ALT: 23 U/L (ref 9–46)
AST: 32 U/L (ref 10–35)
Albumin: 3.1 g/dL — ABNORMAL LOW (ref 3.6–5.1)
Alkaline Phosphatase: 77 U/L (ref 40–115)
BILIRUBIN TOTAL: 1.3 mg/dL — AB (ref 0.2–1.2)
BUN: 18 mg/dL (ref 7–25)
CALCIUM: 8.9 mg/dL (ref 8.6–10.3)
CO2: 27 mmol/L (ref 20–31)
Chloride: 106 mmol/L (ref 98–110)
Creat: 1.01 mg/dL (ref 0.70–1.18)
GLUCOSE: 188 mg/dL — AB (ref 65–99)
POTASSIUM: 4.5 mmol/L (ref 3.5–5.3)
Sodium: 138 mmol/L (ref 135–146)
TOTAL PROTEIN: 6.1 g/dL (ref 6.1–8.1)

## 2015-09-14 LAB — LIPID PANEL
CHOL/HDL RATIO: 3.5 ratio (ref <=5.0)
Cholesterol: 110 mg/dL — ABNORMAL LOW (ref 125–200)
HDL: 31 mg/dL — AB (ref >=40)
LDL Cholesterol: 25 mg/dL (ref <130)
Triglycerides: 271 mg/dL — ABNORMAL HIGH (ref <150)
VLDL: 54 mg/dL — ABNORMAL HIGH (ref <30)

## 2015-09-14 LAB — HEMOGLOBIN A1C
HEMOGLOBIN A1C: 9.2 % — AB (ref <5.7)
Mean Plasma Glucose: 217 mg/dL — ABNORMAL HIGH (ref <117)

## 2015-09-14 MED ORDER — LOSARTAN POTASSIUM-HCTZ 100-25 MG PO TABS: 1.0000 | ORAL_TABLET | Freq: Every day | ORAL | Status: DC

## 2015-09-14 NOTE — Assessment & Plan Note (Signed)
Will check CMET and Lipid Profile today Encouraged him to consume a low fat diet Continue Crestor and ASA daily

## 2015-09-14 NOTE — Progress Notes (Signed)
HPI  Pt presents to the clinic today to establish care and for management of the conditions listed below. He is transferring care from Dr. Toy Cookey.  DM 2: His fasting sugars range 90-100. His nightly sugars range 250-300. He takes Lantus 75 units BID. He takes Humalog BID based on sliding scale. His A1C has not been checked in 3 years (A1C 5 months ago 8.9 in EMR). He reports he used to see an endocrinologist many years ago, but is no longer following with them. He does not take immunizations. His last eye exam was 12/2014.  HTN: He reports he has been out of his Losartan HCT x 1 month. His BP today is 166/90. He denies chest pain or shortness of breath.  Rheumatoid Arthritis: He does not feel like the Methotrexate or Folic acid are helping. He has pain and stiffness in his hands are constant. He used to follow with rheumatology but has not seem them in a while. He needs referral to a rheumatologist.  HLD: He denies myalgias on Crestor. He does try to consume a low fat diet. He is not sure when his cholesterol was last checked (last LDL 77.2, 2013 in EMR).  Colon Cancer:  4 in colectomy in 04/2015. He did not need further chemo or radiation. He is not sure when he is supposed to follow up with Dr. Johney Maine.  He also reports bilateral hip pain. This has been going on for months. He describes the pain as sharp and stabbing. The pain does not radiate. He does feel like his hips are weak. He has fallen secondary to his hip pain but denies injury.   Flu: never Tetanus: > 10 years ago  Pneumovax: never Prevnar: never Zostovax: never PSA screening: unsure Colonoscopy: 09/2014 Vision Screening: 12/2014 Dentist: prn- dentures  Past Medical History  Diagnosis Date  . Hypertension   . Diabetes mellitus   . Hyperlipidemia   . Arthritis   . Vitamin D deficiency   . 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 aspirin 81 MG tablet Take 162  mg by mouth at bedtime.     . folic acid (FOLVITE) 1 MG tablet Take 1 mg by mouth every morning.     . insulin glargine (LANTUS) 100 UNIT/ML injection Inject 75 Units into the skin 2 (two) times daily.     . insulin lispro (HUMALOG KWIKPEN) 100 UNIT/ML KiwkPen Inject 2-6 Units into the skin 3 (three) times daily as needed (high blood sugar). Sliding scale    . losartan-hydrochlorothiazide (HYZAAR) 100-25 MG per tablet TAKE 1 TABLET DAILY (PATIENT NEEDS TO CALL OFFICE TO SCHEDULE FOLLOW UP APPOINTMENT) (Patient taking differently: TAKE 1 TABLET DAILY) 90 tablet 0  . methotrexate (RHEUMATREX) 2.5 MG tablet Take 20 mg by mouth every Friday.    . metoprolol succinate (TOPROL-XL) 50 MG 24 hr tablet Take 1 tablet (50 mg total) by mouth daily. Take with or immediately following a meal. 90 tablet 3  . oxyCODONE (OXY IR/ROXICODONE) 5 MG immediate release tablet Take 1-2 tablets (5-10 mg total) by mouth every 6 (six) hours as needed for moderate pain, severe pain or breakthrough pain. 30 tablet 0  . rosuvastatin (CRESTOR) 20 MG tablet Take 20 mg by mouth at bedtime.      No current facility-administered medications for this visit.    No Known Allergies  Family History  Problem Relation Age of Onset  . Cancer Father     "  all over"  . Coronary artery disease Brother   . Diabetes Brother   . Stroke Mother   . Diabetes Mother   . Colon cancer Neg Hx   . Esophageal cancer Neg Hx   . Rectal cancer Neg Hx   . Stomach cancer Neg Hx     Social History   Social History  . Marital Status: Divorced    Spouse Name: N/A  . Number of Children: 2  . Years of Education: N/A   Occupational History  . 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 Smoking status: Former Smoker    Types: Cigarettes    Quit date: 08/13/1994  . Smokeless tobacco: Never Used  . Alcohol Use: No  . Drug Use: No  . Sexual Activity: Not on file   Other Topics Concern  . Not on file   Social History Narrative     ROS:  Constitutional: Pt reports weight gain. Denies fever, malaise, fatigue, headache.  HEENT: Denies eye pain, eye redness, ear pain, ringing in the ears, wax buildup, runny nose, nasal congestion, bloody nose, or sore throat. Respiratory: Denies difficulty breathing, shortness of breath, cough or sputum production.   Cardiovascular: Denies chest pain, chest tightness, palpitations or swelling in the hands or feet.  Gastrointestinal: Denies abdominal pain, bloating, constipation, diarrhea or blood in the stool.  GU: Denies frequency, urgency, pain with urination, blood in urine, odor or discharge. Musculoskeletal: Pt reports hip pain. Denies decrease in range of motion, difficulty with gait, muscle pain or joint swelling.  Skin: Denies redness, rashes, lesions or ulcercations.  Neurological: Denies dizziness, difficulty with memory, difficulty with speech or problems with balance and coordination.  Psych: Denies anxiety, depression, SI/HI.  No other specific complaints in a complete review of systems (except as listed in HPI above).  PE:  BP 166/90 mmHg  Pulse 68  Temp(Src) 98.2 F (36.8 C) (Oral)  Ht 5\' 7"  (1.702 m)  Wt 225 lb (102.059 kg)  BMI 35.23 kg/m2  SpO2 98% Wt Readings from Last 3 Encounters:  09/14/15 225 lb (102.059 kg)  04/12/15 213 lb 3 oz (96.7 kg)  04/10/15 204 lb 14.4 oz (92.942 kg)    General: Appears his stated age, obese in NAD. Skin: Warm, dry and intact. HEENT: Head: normal shape and size; Eyes: sclera white, no icterus, conjunctiva pink, PERRLA and EOMs intact;  Cardiovascular: Normal rate and rhythm. S1,S2 noted.  No murmur, rubs or gallops noted. No JVD or BLE edema. No carotid bruits noted. Pulmonary/Chest: Normal effort and positive vesicular breath sounds. No respiratory distress. No wheezes, rales or ronchi noted.  Musculoskeletal: Normal internal and external rotation of bilateral hips. Normal flexion and extension of hips. Strength 5/5  BUE/BLE. No signs of joint swelling. No difficulty with gait.  Neurological: Alert and oriented.  Psychiatric: Mood and affect normal. Behavior is normal. Judgment and thought content normal.     BMET    Component Value Date/Time   NA 141 04/14/2015 0550   K 3.6 04/14/2015 0550   CL 108 04/14/2015 0550   CO2 25 04/14/2015 0550   GLUCOSE 162* 04/14/2015 0550   BUN 17 04/14/2015 0550   CREATININE 1.08 04/14/2015 0550   CALCIUM 8.2* 04/14/2015 0550   GFRNONAA >60 04/14/2015 0550   GFRAA >60 04/14/2015 0550    Lipid Panel     Component Value Date/Time   CHOL 157 09/16/2012 1015   TRIG 315.0* 09/16/2012 1015   HDL 28.30* 09/16/2012 1015  CHOLHDL 6 09/16/2012 1015   VLDL 63.0* 09/16/2012 1015    CBC    Component Value Date/Time   WBC 8.4 04/14/2015 0550   RBC 4.08* 04/14/2015 0550   HGB 11.8* 04/14/2015 0550   HCT 35.4* 04/14/2015 0550   PLT 183 04/14/2015 0550   MCV 86.8 04/14/2015 0550   MCH 28.9 04/14/2015 0550   MCHC 33.3 04/14/2015 0550   RDW 14.5 04/14/2015 0550   LYMPHSABS 1.3 04/11/2015 1741   MONOABS 1.5* 04/11/2015 1741   EOSABS 0.0 04/11/2015 1741   BASOSABS 0.0 04/11/2015 1741    Hgb A1C Lab Results  Component Value Date   HGBA1C 8.9* 04/06/2015     Assessment and Plan:  Bilateral hip pain:  ? Arthritis Will obtain xray of bilateral hips today  RTC in 1 month for medicare wellness/ follow up HTN

## 2015-09-14 NOTE — Assessment & Plan Note (Signed)
Will check A1C today He declines screening vaccines Reinforced diabetic diet No microalbumin secondary to ARB therapy Foot exam today Will adjust insulin doses if needed

## 2015-09-14 NOTE — Assessment & Plan Note (Signed)
Will refer to rheumatology for management of his MTX

## 2015-09-14 NOTE — Assessment & Plan Note (Signed)
He will continue to follow with Dr. Johney Maine

## 2015-09-14 NOTE — Patient Instructions (Signed)
Diabetes Mellitus and Food It is important for you to manage your blood sugar (glucose) level. Your blood glucose level can be greatly affected by what you eat. Eating healthier foods in the appropriate amounts throughout the day at about the same time each day will help you control your blood glucose level. It can also help slow or prevent worsening of your diabetes mellitus. Healthy eating may even help you improve the level of your blood pressure and reach or maintain a healthy weight.  General recommendations for healthful eating and cooking habits include:  Eating meals and snacks regularly. Avoid going long periods of time without eating to lose weight.  Eating a diet that consists mainly of plant-based foods, such as fruits, vegetables, nuts, legumes, and whole grains.  Using low-heat cooking methods, such as baking, instead of high-heat cooking methods, such as deep frying. Work with your dietitian to make sure you understand how to use the Nutrition Facts information on food labels. HOW CAN FOOD AFFECT ME? Carbohydrates Carbohydrates affect your blood glucose level more than any other type of food. Your dietitian will help you determine how many carbohydrates to eat at each meal and teach you how to count carbohydrates. Counting carbohydrates is important to keep your blood glucose at a healthy level, especially if you are using insulin or taking certain medicines for diabetes mellitus. Alcohol Alcohol can cause sudden decreases in blood glucose (hypoglycemia), especially if you use insulin or take certain medicines for diabetes mellitus. Hypoglycemia can be a life-threatening condition. Symptoms of hypoglycemia (sleepiness, dizziness, and disorientation) are similar to symptoms of having too much alcohol.  If your health care provider has given you approval to drink alcohol, do so in moderation and use the following guidelines:  Women should not have more than one drink per day, and men  should not have more than two drinks per day. One drink is equal to:  12 oz of beer.  5 oz of wine.  1 oz of hard liquor.  Do not drink on an empty stomach.  Keep yourself hydrated. Have water, diet soda, or unsweetened iced tea.  Regular soda, juice, and other mixers might contain a lot of carbohydrates and should be counted. WHAT FOODS ARE NOT RECOMMENDED? As you make food choices, it is important to remember that all foods are not the same. Some foods have fewer nutrients per serving than other foods, even though they might have the same number of calories or carbohydrates. It is difficult to get your body what it needs when you eat foods with fewer nutrients. Examples of foods that you should avoid that are high in calories and carbohydrates but low in nutrients include:  Trans fats (most processed foods list trans fats on the Nutrition Facts label).  Regular soda.  Juice.  Candy.  Sweets, such as cake, pie, doughnuts, and cookies.  Fried foods. WHAT FOODS CAN I EAT? Eat nutrient-rich foods, which will nourish your body and keep you healthy. The food you should eat also will depend on several factors, including:  The calories you need.  The medicines you take.  Your weight.  Your blood glucose level.  Your blood pressure level.  Your cholesterol level. You should eat a variety of foods, including:  Protein.  Lean cuts of meat.  Proteins low in saturated fats, such as fish, egg whites, and beans. Avoid processed meats.  Fruits and vegetables.  Fruits and vegetables that may help control blood glucose levels, such as apples, mangoes, and   yams.  Dairy products.  Choose fat-free or low-fat dairy products, such as milk, yogurt, and cheese.  Grains, bread, pasta, and rice.  Choose whole grain products, such as multigrain bread, whole oats, and brown rice. These foods may help control blood pressure.  Fats.  Foods containing healthful fats, such as nuts,  avocado, olive oil, canola oil, and fish. DOES EVERYONE WITH DIABETES MELLITUS HAVE THE SAME MEAL PLAN? Because every person with diabetes mellitus is different, there is not one meal plan that works for everyone. It is very important that you meet with a dietitian who will help you create a meal plan that is just right for you.   This information is not intended to replace advice given to you by your health care provider. Make sure you discuss any questions you have with your health care provider.   Document Released: 06/29/2005 Document Revised: 10/23/2014 Document Reviewed: 08/29/2013 Elsevier Interactive Patient Education 2016 Elsevier Inc.  

## 2015-09-14 NOTE — Assessment & Plan Note (Addendum)
Will refill Losartan HCT and Toprol CBC and CMET today

## 2015-09-14 NOTE — Progress Notes (Signed)
Pre visit review using our clinic review tool, if applicable. No additional management support is needed unless otherwise documented below in the visit note. 

## 2015-09-16 ENCOUNTER — Other Ambulatory Visit: Payer: Self-pay | Admitting: Rheumatology

## 2015-09-16 DIAGNOSIS — R351 Nocturia: Secondary | ICD-10-CM | POA: Diagnosis not present

## 2015-09-16 DIAGNOSIS — M79641 Pain in right hand: Secondary | ICD-10-CM | POA: Diagnosis not present

## 2015-09-16 DIAGNOSIS — M79642 Pain in left hand: Secondary | ICD-10-CM | POA: Diagnosis not present

## 2015-09-16 DIAGNOSIS — M0579 Rheumatoid arthritis with rheumatoid factor of multiple sites without organ or systems involvement: Secondary | ICD-10-CM | POA: Diagnosis not present

## 2015-09-16 DIAGNOSIS — R600 Localized edema: Secondary | ICD-10-CM | POA: Diagnosis not present

## 2015-09-16 DIAGNOSIS — M7061 Trochanteric bursitis, right hip: Secondary | ICD-10-CM | POA: Diagnosis not present

## 2015-09-16 DIAGNOSIS — R3912 Poor urinary stream: Secondary | ICD-10-CM | POA: Diagnosis not present

## 2015-09-16 DIAGNOSIS — M25471 Effusion, right ankle: Secondary | ICD-10-CM | POA: Diagnosis not present

## 2015-09-16 DIAGNOSIS — N3944 Nocturnal enuresis: Secondary | ICD-10-CM | POA: Diagnosis not present

## 2015-09-16 DIAGNOSIS — Z79899 Other long term (current) drug therapy: Secondary | ICD-10-CM | POA: Diagnosis not present

## 2015-09-16 DIAGNOSIS — C186 Malignant neoplasm of descending colon: Secondary | ICD-10-CM | POA: Diagnosis not present

## 2015-09-16 DIAGNOSIS — M25472 Effusion, left ankle: Secondary | ICD-10-CM | POA: Diagnosis not present

## 2015-09-16 DIAGNOSIS — E43 Unspecified severe protein-calorie malnutrition: Secondary | ICD-10-CM

## 2015-09-17 ENCOUNTER — Ambulatory Visit
Admission: RE | Admit: 2015-09-17 | Discharge: 2015-09-17 | Disposition: A | Discharge status: Home / Self Care | Payer: Medicare Other | Source: Ambulatory Visit | Attending: Rheumatology | Admitting: Rheumatology

## 2015-09-17 DIAGNOSIS — E43 Unspecified severe protein-calorie malnutrition: Secondary | ICD-10-CM

## 2015-09-17 DIAGNOSIS — R600 Localized edema: Secondary | ICD-10-CM | POA: Diagnosis not present

## 2015-10-19 DIAGNOSIS — R3912 Poor urinary stream: Secondary | ICD-10-CM | POA: Diagnosis not present

## 2015-10-19 DIAGNOSIS — N3944 Nocturnal enuresis: Secondary | ICD-10-CM | POA: Diagnosis not present

## 2015-10-19 DIAGNOSIS — R338 Other retention of urine: Secondary | ICD-10-CM | POA: Diagnosis not present

## 2015-10-20 ENCOUNTER — Ambulatory Visit (INDEPENDENT_AMBULATORY_CARE_PROVIDER_SITE_OTHER): Payer: Medicare Other | Admitting: Internal Medicine

## 2015-10-20 ENCOUNTER — Encounter: Payer: Self-pay | Admitting: Internal Medicine

## 2015-10-20 VITALS — BP 158/80 | HR 82 | Temp 98.2°F | Ht 67.0 in | Wt 223.0 lb

## 2015-10-20 DIAGNOSIS — Z0000 Encounter for general adult medical examination without abnormal findings: Secondary | ICD-10-CM | POA: Diagnosis not present

## 2015-10-20 DIAGNOSIS — I10 Essential (primary) hypertension: Secondary | ICD-10-CM | POA: Diagnosis not present

## 2015-10-20 DIAGNOSIS — Z23 Encounter for immunization: Secondary | ICD-10-CM | POA: Diagnosis not present

## 2015-10-20 DIAGNOSIS — H9193 Unspecified hearing loss, bilateral: Secondary | ICD-10-CM | POA: Diagnosis not present

## 2015-10-20 MED ORDER — AMLODIPINE BESYLATE 5 MG PO TABS: 5.0000 mg | ORAL_TABLET | Freq: Every day | ORAL | Status: DC

## 2015-10-20 NOTE — Progress Notes (Signed)
HPI:  Pt presents to the clinic today for his Medicare Wellness exam. He is also due for 1 month follow up of HTN.  HTN: He was out of his Losartan HCT for 1 month. He was given a prescription at his last visit. His BP today is 158/80. He reports he is also taking Toprol XL but he is cutting the pills in half. He has noted improvement in his ankle edema. He denies chest pain or shortness of breath.  Past Medical History  Diagnosis Date  . Hypertension   . Diabetes mellitus   . Hyperlipidemia   . Arthritis   . Vitamin D deficiency   . 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 aspirin 81 MG tablet Take 162 mg by mouth at bedtime.     . folic acid (FOLVITE) 1 MG tablet Take 1 mg by mouth every morning.     . insulin glargine (LANTUS) 100 UNIT/ML injection Inject 75 Units into the skin 2 (two) times daily.     . insulin lispro (HUMALOG KWIKPEN) 100 UNIT/ML KiwkPen Inject 2-6 Units into the skin 3 (three) times daily as needed (high blood sugar). Sliding scale    . losartan-hydrochlorothiazide (HYZAAR) 100-25 MG tablet Take 1 tablet by mouth daily. 90 tablet 0  . methotrexate (RHEUMATREX) 2.5 MG tablet Take 20 mg by mouth every Friday.    . metoprolol succinate (TOPROL-XL) 50 MG 24 hr tablet Take 1 tablet (50 mg total) by mouth daily. Take with or immediately following a meal. 90 tablet 3  . oxyCODONE (OXY IR/ROXICODONE) 5 MG immediate release tablet Take 1-2 tablets (5-10 mg total) by mouth every 6 (six) hours as needed for moderate pain, severe pain or breakthrough pain. 30 tablet 0  . rosuvastatin (CRESTOR) 20 MG tablet Take 20 mg by mouth at bedtime.      No current facility-administered medications for this visit.    No Known Allergies  Family History  Problem Relation Age of Onset  . Cancer Father     "all over"  . Coronary artery disease Brother   . Diabetes Brother   . Stroke Mother   . Diabetes Mother   . Colon  cancer Neg Hx   . Esophageal cancer Neg Hx   . Rectal cancer Neg Hx   . Stomach cancer Neg Hx     Social History   Social History  . Marital Status: Divorced    Spouse Name: N/A  . Number of Children: 2  . Years of Education: N/A   Occupational History  . 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 Smoking status: Former Smoker    Types: Cigarettes    Quit date: 08/13/1994  . Smokeless tobacco: Never Used  . Alcohol Use: No  . Drug Use: No  . Sexual Activity: Not Currently   Other Topics Concern  . Not on file   Social History Narrative    Hospitiliaztions: None  Health Maintenance:    Flu: never  Tetanus: > 10 years ago  Pneumovax: never  Prevnar: never  Zostavax: never  PSA: unsure  Colon Screening: 09/2014  Eye Doctor: yearly  Dental Exam: as needed, dentures   Providers:   PCP: Webb Silversmith, NP-C  Cardiologist: Dr. Mare Ferrari  Gastroenterologist: Dr. Deatra Ina     I have personally reviewed and have noted:  1. The patient's medical and social history 2. Their use of alcohol, tobacco or  illicit drugs 3. Their current medications and supplements 4. The patient's functional ability including ADL's, fall risks, home safety risks and  hearing or visual impairment. 5. Diet and physical activities 6. Evidence for depression or mood disorder  Subjective:   Review of Systems:   Constitutional: Denies fever, malaise, fatigue, headache or abrupt weight changes. HEENT: Pt reports hearing loss. Denies runny nose, nasal congestion, ear pain, or sore throat.  Respiratory: Denies difficulty breathing, shortness of breath, cough or sputum production.   Cardiovascular: Denies chest pain, chest tightness, palpitations or swelling in the hands or feet.  Neurological: Denies dizziness, difficulty with memory, difficulty with speech or problems with balance and coordination.  Psych: Denies anxiety, depression, SI/HI.  No other specific complaints in a complete  review of systems (except as listed in HPI above).  Objective:  PE:   BP 158/80 mmHg  Pulse 82  Temp(Src) 98.2 F (36.8 C) (Oral)  Ht 5\' 7"  (1.702 m)  Wt 223 lb (101.152 kg)  BMI 34.92 kg/m2  SpO2 98% Wt Readings from Last 3 Encounters:  10/20/15 223 lb (101.152 kg)  09/14/15 225 lb (102.059 kg)  04/12/15 213 lb 3 oz (96.7 kg)    General: Appears his stated age, obese in NAD. HEENT: Head: normal shape and size; Eyes: sclera white, no icterus, conjunctiva pink, PERRLA and EOMs intact; Ears: Tm's gray and intact, normal light reflex; Weber normal. Rinne abnormal bilaterally. Cardiovascular: Normal rate and rhythm. S1,S2 noted.  No murmur, rubs or gallops noted. No JVD or BLE edema. No carotid bruits noted. Pulmonary/Chest: Normal effort and positive vesicular breath sounds. No respiratory distress. No wheezes, rales or ronchi noted.  Neurological: Alert and oriented.   Psychiatric: Mood and affect normal. Behavior is normal. Judgment and thought content normal.     BMET    Component Value Date/Time   NA 138 09/14/2015 1139   K 4.5 09/14/2015 1139   CL 106 09/14/2015 1139   CO2 27 09/14/2015 1139   GLUCOSE 188* 09/14/2015 1139   BUN 18 09/14/2015 1139   CREATININE 1.01 09/14/2015 1139   CREATININE 1.08 04/14/2015 0550   CALCIUM 8.9 09/14/2015 1139   GFRNONAA >60 04/14/2015 0550   GFRAA >60 04/14/2015 0550    Lipid Panel     Component Value Date/Time   CHOL 110* 09/14/2015 1139   TRIG 271* 09/14/2015 1139   HDL 31* 09/14/2015 1139   CHOLHDL 3.5 09/14/2015 1139   VLDL 54* 09/14/2015 1139   LDLCALC 25 09/14/2015 1139    CBC    Component Value Date/Time   WBC 9.6 09/14/2015 1139   RBC 4.95 09/14/2015 1139   HGB 14.3 09/14/2015 1139   HCT 43.2 09/14/2015 1139   PLT 227 09/14/2015 1139   MCV 87.3 09/14/2015 1139   MCH 28.9 09/14/2015 1139   MCHC 33.1 09/14/2015 1139   RDW 15.9* 09/14/2015 1139   LYMPHSABS 2.2 09/14/2015 1139   MONOABS 1.1* 09/14/2015 1139    EOSABS 0.3 09/14/2015 1139   BASOSABS 0.0 09/14/2015 1139    Hgb A1C Lab Results  Component Value Date   HGBA1C 9.2* 09/14/2015      Assessment and Plan:   Medicare Annual Wellness Visit:  Diet: He does not adhere to any specific diet Physical activity: Sedentary Depression/mood screen: Negative Hearing: Decreased Visual acuity: Grossly normal, performs annual eye exam  ADLs: Capable Fall risk: None Home safety: Good Cognitive evaluation: Intact to orientation, naming, recall and repetition EOL planning: No adv directives, full code/  I agree  Preventative Medicine: Tdap today. He declines all other prophylactic vaccines. He wants to defer PSA screen until his next appt. Colonoscopy UTD. Encouraged him to continue to see an eye doctor annually. He does not need to see a dentist secondary to dentures. Encouraged him to consume a low fat, low carb diet and start an exercise regimen.  Next appointment: 6 months  Conductive hearing loss, bilateral:  Referral placed to audiology for further evaluation

## 2015-10-20 NOTE — Progress Notes (Signed)
Pre visit review using our clinic review tool, if applicable. No additional management support is needed unless otherwise documented below in the visit note. 

## 2015-10-21 ENCOUNTER — Encounter: Payer: Self-pay | Admitting: Internal Medicine

## 2015-10-21 DIAGNOSIS — Z79899 Other long term (current) drug therapy: Secondary | ICD-10-CM | POA: Diagnosis not present

## 2015-10-21 DIAGNOSIS — M25471 Effusion, right ankle: Secondary | ICD-10-CM | POA: Diagnosis not present

## 2015-10-21 DIAGNOSIS — M25472 Effusion, left ankle: Secondary | ICD-10-CM | POA: Diagnosis not present

## 2015-10-21 DIAGNOSIS — M0579 Rheumatoid arthritis with rheumatoid factor of multiple sites without organ or systems involvement: Secondary | ICD-10-CM | POA: Diagnosis not present

## 2015-10-21 DIAGNOSIS — M7061 Trochanteric bursitis, right hip: Secondary | ICD-10-CM | POA: Diagnosis not present

## 2015-10-21 DIAGNOSIS — M79641 Pain in right hand: Secondary | ICD-10-CM | POA: Diagnosis not present

## 2015-10-21 DIAGNOSIS — M79642 Pain in left hand: Secondary | ICD-10-CM | POA: Diagnosis not present

## 2015-10-21 NOTE — Patient Instructions (Signed)
Hearing Loss °Hearing loss is a partial or total loss of the ability to hear. This can be temporary or permanent, and it can happen in one or both ears. Hearing loss may be referred to as deafness. °Medical care is necessary to treat hearing loss properly and to prevent the condition from getting worse. Your hearing may partially or completely come back, depending on what caused your hearing loss and how severe it is. In some cases, hearing loss is permanent. °CAUSES °Common causes of hearing loss include:  °· Too much wax in the ear canal.   °· Infection of the ear canal or middle ear.   °· Fluid in the middle ear.   °· Injury to the ear or surrounding area.   °· An object stuck in the ear.   °· Prolonged exposure to loud sounds, such as music.   °Less common causes of hearing loss include:  °· Tumors in the ear.   °· Viral or bacterial infections, such as meningitis.   °· A hole in the eardrum (perforated eardrum). °· Problems with the hearing nerve that sends signals between the brain and the ear. °· Certain medicines.   °SYMPTOMS  °Symptoms of this condition may include: °· Difficulty telling the difference between sounds. °· Difficulty following a conversation when there is background noise. °· Lack of response to sounds in your environment. This may be most noticeable when you do not respond to startling sounds. °· Needing to turn up the volume on the television, radio, etc. °· Ringing in the ears. °· Dizziness. °· Pain in the ears. °DIAGNOSIS °This condition is diagnosed based on a physical exam and a hearing test (audiometry). The audiometry test will be performed by a hearing specialist (audiologist). You may also be referred to an ear, nose, and throat (ENT) specialist (otolaryngologist).  °TREATMENT °Treatment for recent onset of hearing loss may include:  °· Ear wax removal.   °· Being prescribed medicines to prevent infection (antibiotics).   °· Being prescribed medicines to reduce inflammation  (corticosteroids).   °HOME CARE INSTRUCTIONS °· If you were prescribed an antibiotic medicine, take it as told by your health care provider. Do not stop taking the antibiotic even if you start to feel better. °· Take over-the-counter and prescription medicines only as told by your health care provider. °· Avoid loud noises.   °· Return to your normal activities as told by your health care provider. Ask your health care provider what activities are safe for you. °· Keep all follow-up visits as told by your health care provider. This is important. °SEEK MEDICAL CARE IF:  °· You feel dizzy.   °· You develop new symptoms.   °· You vomit or feel nauseous.   °· You have a fever.   °SEEK IMMEDIATE MEDICAL CARE IF: °· You develop sudden changes in your vision.   °· You have severe ear pain.   °· You have new or increased weakness. °· You have a severe headache. °  °This information is not intended to replace advice given to you by your health care provider. Make sure you discuss any questions you have with your health care provider. °  °Document Released: 10/02/2005 Document Revised: 06/23/2015 Document Reviewed: 02/17/2015 °Elsevier Interactive Patient Education ©2016 [employer] Inc[employer] ° °

## 2015-10-21 NOTE — Assessment & Plan Note (Signed)
Still not controlled Continue Losartan HCT Stop Metoprolol Start Amlodipine 5 mg daily- RX sent to pharmacy  RTC in 3 weeks to follow up BP

## 2015-11-10 ENCOUNTER — Encounter: Payer: Self-pay | Admitting: Internal Medicine

## 2015-11-10 ENCOUNTER — Ambulatory Visit (INDEPENDENT_AMBULATORY_CARE_PROVIDER_SITE_OTHER): Payer: Medicare Other | Admitting: Internal Medicine

## 2015-11-10 VITALS — BP 136/84 | HR 72 | Temp 97.6°F | Wt 224.0 lb

## 2015-11-10 DIAGNOSIS — I10 Essential (primary) hypertension: Secondary | ICD-10-CM

## 2015-11-10 MED ORDER — AMLODIPINE BESYLATE 5 MG PO TABS: 5.0000 mg | ORAL_TABLET | Freq: Every day | ORAL | Status: DC

## 2015-11-10 MED ORDER — LOSARTAN POTASSIUM-HCTZ 100-25 MG PO TABS: 1.0000 | ORAL_TABLET | Freq: Every day | ORAL | Status: DC

## 2015-11-10 NOTE — Patient Instructions (Signed)
Hypertension Hypertension, commonly called high blood pressure, is when the force of blood pumping through your arteries is too strong. Your arteries are the blood vessels that carry blood from your heart throughout your body. A blood pressure reading consists of a higher number over a lower number, such as 110/72. The higher number (systolic) is the pressure inside your arteries when your heart pumps. The lower number (diastolic) is the pressure inside your arteries when your heart relaxes. Ideally you want your blood pressure below 120/80. Hypertension forces your heart to work harder to pump blood. Your arteries may become narrow or stiff. Having untreated or uncontrolled hypertension can cause heart attack, stroke, kidney disease, and other problems. RISK FACTORS Some risk factors for high blood pressure are controllable. Others are not.  Risk factors you cannot control include:   Race. You may be at higher risk if you are African American.  Age. Risk increases with age.  Gender. Men are at higher risk than women before age 45 years. After age 65, women are at higher risk than men. Risk factors you can control include:  Not getting enough exercise or physical activity.  Being overweight.  Getting too much fat, sugar, calories, or salt in your diet.  Drinking too much alcohol. SIGNS AND SYMPTOMS Hypertension does not usually cause signs or symptoms. Extremely high blood pressure (hypertensive crisis) may cause headache, anxiety, shortness of breath, and nosebleed. DIAGNOSIS To check if you have hypertension, your health care provider will measure your blood pressure while you are seated, with your arm held at the level of your heart. It should be measured at least twice using the same arm. Certain conditions can cause a difference in blood pressure between your right and left arms. A blood pressure reading that is higher than normal on one occasion does not mean that you need treatment. If  it is not clear whether you have high blood pressure, you may be asked to return on a different day to have your blood pressure checked again. Or, you may be asked to monitor your blood pressure at home for 1 or more weeks. TREATMENT Treating high blood pressure includes making lifestyle changes and possibly taking medicine. Living a healthy lifestyle can help lower high blood pressure. You may need to change some of your habits. Lifestyle changes may include:  Following the DASH diet. This diet is high in fruits, vegetables, and whole grains. It is low in salt, red meat, and added sugars.  Keep your sodium intake below 2,300 mg per day.  Getting at least 30-45 minutes of aerobic exercise at least 4 times per week.  Losing weight if necessary.  Not smoking.  Limiting alcoholic beverages.  Learning ways to reduce stress. Your health care provider may prescribe medicine if lifestyle changes are not enough to get your blood pressure under control, and if one of the following is true:  You are 18-59 years of age and your systolic blood pressure is above 140.  You are 60 years of age or older, and your systolic blood pressure is above 150.  Your diastolic blood pressure is above 90.  You have diabetes, and your systolic blood pressure is over 140 or your diastolic blood pressure is over 90.  You have kidney disease and your blood pressure is above 140/90.  You have heart disease and your blood pressure is above 140/90. Your personal target blood pressure may vary depending on your medical conditions, your age, and other factors. HOME CARE INSTRUCTIONS    Have your blood pressure rechecked as directed by your health care provider.   Take medicines only as directed by your health care provider. Follow the directions carefully. Blood pressure medicines must be taken as prescribed. The medicine does not work as well when you skip doses. Skipping doses also puts you at risk for  problems.  Do not smoke.   Monitor your blood pressure at home as directed by your health care provider. SEEK MEDICAL CARE IF:   You think you are having a reaction to medicines taken.  You have recurrent headaches or feel dizzy.  You have swelling in your ankles.  You have trouble with your vision. SEEK IMMEDIATE MEDICAL CARE IF:  You develop a severe headache or confusion.  You have unusual weakness, numbness, or feel faint.  You have severe chest or abdominal pain.  You vomit repeatedly.  You have trouble breathing. MAKE SURE YOU:   Understand these instructions.  Will watch your condition.  Will get help right away if you are not doing well or get worse.   This information is not intended to replace advice given to you by your health care provider. Make sure you discuss any questions you have with your health care provider.   Document Released: 10/02/2005 Document Revised: 02/16/2015 Document Reviewed: 07/25/2013 Elsevier Interactive Patient Education 2016 [employer] Inc[employer]  

## 2015-11-10 NOTE — Assessment & Plan Note (Signed)
Controlled on Losartan HCT and Amlodipine [facility]

## 2015-11-10 NOTE — Progress Notes (Signed)
Subjective:    Patient ID: [employer], male    DOB: [birth year], 75 y.o.   MRN: [id number]  HPI  Pt presents to the clinic today for 3 week follow up of HTN. At his last visit, we d/c'd his Toprol and added Amlodipine in addition to his Losartan-HCT. He has been taking the medication as prescribed and denies adverse effect. His BP today is 136/84. ECG from 03/2015 reviewed.   Review of Systems      Past Medical History  Diagnosis Date  . Hypertension   . Diabetes mellitus   . Hyperlipidemia   . Arthritis   . Vitamin D deficiency   . 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 amLODipine (NORVASC) 5 MG tablet Take 1 tablet (5 mg total) by mouth daily. 30 tablet 0  . aspirin 81 MG tablet Take 162 mg by mouth at bedtime.     . folic acid (FOLVITE) 1 MG tablet Take 1 mg by mouth every morning.     . insulin glargine (LANTUS) 100 UNIT/ML injection Inject 75 Units into the skin 2 (two) times daily.     . insulin lispro (HUMALOG KWIKPEN) 100 UNIT/ML KiwkPen Inject 2-6 Units into the skin 3 (three) times daily as needed (high blood sugar). Sliding scale    . losartan-hydrochlorothiazide (HYZAAR) 100-25 MG tablet Take 1 tablet by mouth daily. 90 tablet 0  . methotrexate (RHEUMATREX) 2.5 MG tablet Take 20 mg by mouth every Friday.    Marland Kitchen oxyCODONE (OXY IR/ROXICODONE) 5 MG immediate release tablet Take 1-2 tablets (5-10 mg total) by mouth every 6 (six) hours as needed for moderate pain, severe pain or breakthrough pain. 30 tablet 0  . rosuvastatin (CRESTOR) 20 MG tablet Take 20 mg by mouth at bedtime.      No current facility-administered medications for this visit.    No Known Allergies  Family History  Problem Relation Age of Onset  . Cancer Father     "all over"  . Coronary artery disease Brother   . Diabetes Brother   . Stroke Mother   . Diabetes Mother   . Colon cancer Neg Hx   . Esophageal cancer Neg Hx   .  Rectal cancer Neg Hx   . Stomach cancer Neg Hx     Social History   Social History  . Marital Status: Divorced    Spouse Name: N/A  . Number of Children: 2  . Years of Education: N/A   Occupational History  . 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 Smoking status: Former Smoker    Types: Cigarettes    Quit date: 08/13/1994  . Smokeless tobacco: Never Used  . Alcohol Use: No  . Drug Use: No  . Sexual Activity: Not Currently   Other Topics Concern  . Not on file   Social History Narrative     Constitutional: Denies fever, malaise, fatigue, headache or abrupt weight changes.  Respiratory: Denies difficulty breathing, shortness of breath, cough or sputum production.   Cardiovascular: Denies chest pain, chest tightness, palpitations or swelling in the hands or feet. Neurological: Denies dizziness, difficulty with memory, difficulty with speech or problems with balance and coordination.    No other specific complaints in a complete review of systems (except as listed in HPI above).  Objective:   Physical Exam  BP 136/84 mmHg  Pulse 72  Temp(Src) 97.6 F (36.4 C) (Oral)  Wt 224 lb (101.606 kg)  SpO2 97% Wt Readings from Last 3 Encounters:  11/10/15 224 lb (101.606 kg)  10/20/15 223 lb (101.152 kg)  09/14/15 225 lb (102.059 kg)    General: Appears his stated age, well developed, well nourished in NAD. Cardiovascular: Normal rate and rhythm. S1,S2 noted.  No murmur, rubs or gallops noted.  Pulmonary/Chest: Normal effort and positive vesicular breath sounds. No respiratory distress. No wheezes, rales or ronchi noted.  Neurological: Alert and oriented.    BMET    Component Value Date/Time   NA 138 09/14/2015 1139   K 4.5 09/14/2015 1139   CL 106 09/14/2015 1139   CO2 27 09/14/2015 1139   GLUCOSE 188* 09/14/2015 1139   BUN 18 09/14/2015 1139   CREATININE 1.01 09/14/2015 1139   CREATININE 1.08 04/14/2015 0550   CALCIUM 8.9 09/14/2015 1139   GFRNONAA >60  04/14/2015 0550   GFRAA >60 04/14/2015 0550    Lipid Panel     Component Value Date/Time   CHOL 110* 09/14/2015 1139   TRIG 271* 09/14/2015 1139   HDL 31* 09/14/2015 1139   CHOLHDL 3.5 09/14/2015 1139   VLDL 54* 09/14/2015 1139   LDLCALC 25 09/14/2015 1139    CBC    Component Value Date/Time   WBC 9.6 09/14/2015 1139   RBC 4.95 09/14/2015 1139   HGB 14.3 09/14/2015 1139   HCT 43.2 09/14/2015 1139   PLT 227 09/14/2015 1139   MCV 87.3 09/14/2015 1139   MCH 28.9 09/14/2015 1139   MCHC 33.1 09/14/2015 1139   RDW 15.9* 09/14/2015 1139   LYMPHSABS 2.2 09/14/2015 1139   MONOABS 1.1* 09/14/2015 1139   EOSABS 0.3 09/14/2015 1139   BASOSABS 0.0 09/14/2015 1139    Hgb A1C Lab Results  Component Value Date   HGBA1C 9.2* 09/14/2015         Assessment & Plan:

## 2015-11-10 NOTE — Progress Notes (Signed)
Pre visit review using our clinic review tool, if applicable. No additional management support is needed unless otherwise documented below in the visit note. 

## 2015-11-15 ENCOUNTER — Other Ambulatory Visit: Payer: Self-pay | Admitting: Internal Medicine

## 2015-11-18 DIAGNOSIS — Z0000 Encounter for general adult medical examination without abnormal findings: Secondary | ICD-10-CM | POA: Diagnosis not present

## 2015-11-18 DIAGNOSIS — R3912 Poor urinary stream: Secondary | ICD-10-CM | POA: Diagnosis not present

## 2015-11-18 DIAGNOSIS — R351 Nocturia: Secondary | ICD-10-CM | POA: Diagnosis not present

## 2015-11-18 DIAGNOSIS — R338 Other retention of urine: Secondary | ICD-10-CM | POA: Diagnosis not present

## 2015-12-15 ENCOUNTER — Encounter: Payer: Self-pay | Admitting: Gastroenterology

## 2016-01-07 ENCOUNTER — Other Ambulatory Visit: Payer: Self-pay

## 2016-01-07 MED ORDER — INSULIN PEN NEEDLE 31G X 5 MM MISC: 1.0000 | Freq: Three times a day (TID) | Status: DC | PRN

## 2016-01-19 DIAGNOSIS — M7061 Trochanteric bursitis, right hip: Secondary | ICD-10-CM | POA: Diagnosis not present

## 2016-01-19 DIAGNOSIS — M0579 Rheumatoid arthritis with rheumatoid factor of multiple sites without organ or systems involvement: Secondary | ICD-10-CM | POA: Diagnosis not present

## 2016-01-19 DIAGNOSIS — Z79899 Other long term (current) drug therapy: Secondary | ICD-10-CM | POA: Diagnosis not present

## 2016-01-19 LAB — HEPATIC FUNCTION PANEL
ALK PHOS: 101 U/L (ref 25–125)
ALT: 27 U/L (ref 10–40)
AST: 34 U/L (ref 14–40)
Bilirubin, Total: 0.6 mg/dL

## 2016-01-19 LAB — BASIC METABOLIC PANEL
BUN: 34 mg/dL — AB (ref 4–21)
Creatinine: 1.6 mg/dL — AB (ref 0.6–1.3)
GLUCOSE: 207 mg/dL
Potassium: 4.6 mmol/L (ref 3.4–5.3)
Sodium: 141 mmol/L (ref 137–147)

## 2016-01-19 LAB — CBC AND DIFFERENTIAL
HEMATOCRIT: 38 % — AB (ref 41–53)
Hemoglobin: 12.6 g/dL — AB (ref 13.5–17.5)
Neutrophils Absolute: 5 /uL
Platelets: 243 10*3/uL (ref 150–399)
WBC: 7.6 10*3/mL

## 2016-01-27 DIAGNOSIS — H2513 Age-related nuclear cataract, bilateral: Secondary | ICD-10-CM | POA: Diagnosis not present

## 2016-01-27 DIAGNOSIS — E119 Type 2 diabetes mellitus without complications: Secondary | ICD-10-CM | POA: Diagnosis not present

## 2016-01-27 DIAGNOSIS — H5703 Miosis: Secondary | ICD-10-CM | POA: Diagnosis not present

## 2016-02-02 ENCOUNTER — Telehealth: Payer: Self-pay

## 2016-02-02 NOTE — Telephone Encounter (Signed)
Diabetic supply form in your basket for completion.

## 2016-02-07 NOTE — Telephone Encounter (Signed)
Form has been completed and given back to MYD

## 2016-02-09 ENCOUNTER — Telehealth: Payer: Self-pay

## 2016-02-09 NOTE — Telephone Encounter (Signed)
Received fax for diabetic supplies from [facility]--called pt and he states he does use this company for DM supplies---will fax back order to [place] per pt request

## 2016-02-15 DIAGNOSIS — R338 Other retention of urine: Secondary | ICD-10-CM | POA: Diagnosis not present

## 2016-03-17 DIAGNOSIS — R351 Nocturia: Secondary | ICD-10-CM | POA: Diagnosis not present

## 2016-04-20 ENCOUNTER — Other Ambulatory Visit: Payer: Self-pay | Admitting: Internal Medicine

## 2016-04-21 ENCOUNTER — Other Ambulatory Visit: Payer: Self-pay

## 2016-04-21 DIAGNOSIS — E1165 Type 2 diabetes mellitus with hyperglycemia: Secondary | ICD-10-CM

## 2016-04-21 DIAGNOSIS — IMO0002 Reserved for concepts with insufficient information to code with codable children: Secondary | ICD-10-CM

## 2016-04-21 DIAGNOSIS — Z794 Long term (current) use of insulin: Principal | ICD-10-CM

## 2016-04-21 MED ORDER — INSULIN GLARGINE 100 UNIT/ML SOLOSTAR PEN: 75.0000 [IU] | PEN_INJECTOR | Freq: Two times a day (BID) | SUBCUTANEOUS | Status: DC

## 2016-05-09 ENCOUNTER — Encounter: Payer: Self-pay | Admitting: Internal Medicine

## 2016-05-09 ENCOUNTER — Ambulatory Visit (INDEPENDENT_AMBULATORY_CARE_PROVIDER_SITE_OTHER): Payer: Medicare Other | Admitting: Internal Medicine

## 2016-05-09 VITALS — BP 138/84 | HR 94 | Temp 98.1°F | Wt 222.0 lb

## 2016-05-09 DIAGNOSIS — E785 Hyperlipidemia, unspecified: Secondary | ICD-10-CM | POA: Diagnosis not present

## 2016-05-09 DIAGNOSIS — E119 Type 2 diabetes mellitus without complications: Secondary | ICD-10-CM | POA: Diagnosis not present

## 2016-05-09 DIAGNOSIS — M1990 Unspecified osteoarthritis, unspecified site: Secondary | ICD-10-CM

## 2016-05-09 DIAGNOSIS — I10 Essential (primary) hypertension: Secondary | ICD-10-CM | POA: Diagnosis not present

## 2016-05-09 DIAGNOSIS — C801 Malignant (primary) neoplasm, unspecified: Secondary | ICD-10-CM

## 2016-05-09 DIAGNOSIS — Z794 Long term (current) use of insulin: Secondary | ICD-10-CM

## 2016-05-09 LAB — COMPREHENSIVE METABOLIC PANEL
ALBUMIN: 3.6 g/dL (ref 3.5–5.2)
ALK PHOS: 71 U/L (ref 39–117)
ALT: 25 U/L (ref 0–53)
AST: 28 U/L (ref 0–37)
BILIRUBIN TOTAL: 1 mg/dL (ref 0.2–1.2)
BUN: 30 mg/dL — ABNORMAL HIGH (ref 6–23)
CALCIUM: 9.7 mg/dL (ref 8.4–10.5)
CO2: 28 mEq/L (ref 19–32)
Chloride: 106 mEq/L (ref 96–112)
Creatinine, Ser: 1.29 mg/dL (ref 0.40–1.50)
GFR: 57.97 mL/min — AB (ref >60.00)
GLUCOSE: 196 mg/dL — AB (ref 70–99)
Potassium: 5.2 mEq/L — ABNORMAL HIGH (ref 3.5–5.1)
Sodium: 140 mEq/L (ref 135–145)
TOTAL PROTEIN: 6.8 g/dL (ref 6.0–8.3)

## 2016-05-09 LAB — LIPID PANEL
Cholesterol: 113 mg/dL (ref 0–200)
HDL: 29.4 mg/dL — ABNORMAL LOW (ref >39.00)
LDL Cholesterol: 45 mg/dL (ref 0–99)
NonHDL: 83.66
Total CHOL/HDL Ratio: 4
Triglycerides: 195 mg/dL — ABNORMAL HIGH (ref 0.0–149.0)
VLDL: 39 mg/dL (ref 0.0–40.0)

## 2016-05-09 LAB — HEMOGLOBIN A1C: Hgb A1c MFr Bld: 7.5 % — ABNORMAL HIGH (ref 4.6–6.5)

## 2016-05-09 MED ORDER — AMLODIPINE BESYLATE 5 MG PO TABS: 5.0000 mg | ORAL_TABLET | Freq: Every day | ORAL | 1 refills | Status: DC

## 2016-05-09 MED ORDER — LOSARTAN POTASSIUM-HCTZ 100-25 MG PO TABS: 1.0000 | ORAL_TABLET | Freq: Every day | ORAL | 1 refills | Status: DC

## 2016-05-09 NOTE — Assessment & Plan Note (Signed)
Currently not an issue Will monitor 

## 2016-05-09 NOTE — Progress Notes (Signed)
HPI  Pt presents to the clinic today for follow up of chronic conditions.  DM 2: His fasting sugars range 81-113. His nightly sugars are typically just over 200. He takes Lantus 75 units BID. He takes Humalog BID based on sliding scale. His last A1C was 9.2%. He reports he used to see an endocrinologist many years ago, but is no longer following with them. He does not take immunizations. His last eye exam was 12/2014.  HTN: He takes Losartan HCT and Amlodipine as prescribed. His BP today is 138/84. He denies chest pain, dizziness, headaches, or shortness of breath. ECG from 03/2015 reviewed.  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  He continues to take the Folic Acid.  He is following with [facility].  HLD: His last LDL was 25, triglycerides 271. He denies myalgias on Crestor. He does try to consume a low fat diet.   Colon Cancer:  4 in colectomy in 04/2015. He did not need further chemo or radiation.     Past Medical History:  Diagnosis Date  . 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 Arthritis   . Diabetes mellitus   . Hyperlipidemia   . Hypertension   . Vitamin D deficiency     Current Outpatient Prescriptions  Medication Sig Dispense Refill  . amLODipine (NORVASC) 5 MG tablet TAKE 1 TABLET DAILY 90 tablet 0  . aspirin 81 MG tablet Take 162 mg by mouth at bedtime.     . folic acid (FOLVITE) 1 MG tablet Take 1 mg by mouth every morning.     . Insulin Glargine (LANTUS SOLOSTAR) 100 UNIT/ML Solostar Pen Inject 75 Units into the skin 2 (two) times daily. 45 pen 1  . insulin lispro (HUMALOG KWIKPEN) 100 UNIT/ML KiwkPen Inject 2-6 Units into the skin 3 (three) times daily as needed (high blood sugar). Sliding scale    . Insulin Pen Needle 31G X 5 MM MISC 1 each by Does not apply route 3 (three) times daily as needed. 300 each 4  .  losartan-hydrochlorothiazide (HYZAAR) 100-25 MG tablet Take 1 tablet by mouth daily. 90 tablet 1  . methotrexate (RHEUMATREX) 2.5 MG tablet Take 20 mg by mouth every Friday.    Marland Kitchen oxyCODONE (OXY IR/ROXICODONE) 5 MG immediate release tablet Take 1-2 tablets (5-10 mg total) by mouth every 6 (six) hours as needed for moderate pain, severe pain or breakthrough pain. 30 tablet 0  . rosuvastatin (CRESTOR) 20 MG tablet Take 20 mg by mouth at bedtime.      No current facility-administered medications for this visit.     No Known Allergies  Family History  Problem Relation Age of Onset  . Cancer Father     "all over"  . Coronary artery disease Brother   . Diabetes Brother   . Stroke Mother   . Diabetes Mother   . Colon cancer Neg Hx   . Esophageal cancer Neg Hx   . Rectal cancer Neg Hx   . Stomach cancer Neg Hx     Social History   Social History  . Marital status: Divorced    Spouse name: N/A  . Number of children: 2  . Years of education: N/A   Occupational History  . 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 Smoking status: Former Smoker    Types: Cigarettes  Quit date: 08/13/1994  . Smokeless tobacco: Never Used  . Alcohol use No  . Drug use: No  . Sexual activity: Not Currently   Other Topics Concern  . Not on file   Social History Narrative  . No narrative on file    ROS:  Constitutional: Denies fever, malaise, fatigue, headache.  HEENT: Denies eye pain, eye redness, ear pain, ringing in the ears, wax buildup, runny nose, nasal congestion, bloody nose, or sore throat. Respiratory: Denies difficulty breathing, shortness of breath, cough or sputum production.   Cardiovascular: Denies chest pain, chest tightness, palpitations or swelling in the hands or feet.  Gastrointestinal: Denies abdominal pain, bloating, constipation, diarrhea or blood in the stool.  GU: Denies frequency, urgency, pain with urination, blood in urine, odor or discharge. Musculoskeletal: Pt  reports joint pain and stiffness. Denies decrease in range of motion, difficulty with gait, muscle pain or joint swelling.  Skin: Denies redness, rashes, lesions or ulcercations.  Neurological: Denies dizziness, difficulty with memory, difficulty with speech or problems with balance and coordination.  Psych: Denies anxiety, depression, SI/HI.  No other specific complaints in a complete review of systems (except as listed in HPI above).  PE:  BP 138/84 (BP Location: Right Arm, Patient Position: Sitting, Cuff Size: Large)   Pulse 94   Temp 98.1 F (36.7 C) (Oral)   Wt 222 lb (100.7 kg)   BMI 34.77 kg/m   Wt Readings from Last 3 Encounters:  11/10/15 224 lb (101.6 kg)  10/20/15 223 lb (101.2 kg)  09/14/15 225 lb (102.1 kg)    General: Appears his stated age, obese in NAD. Skin: Warm, dry and intact. Obvious sun damage. HEENT: Head: normal shape and size; Eyes: sclera white, no icterus, conjunctiva pink, PERRLA and EOMs intact;  Cardiovascular: Normal rate and rhythm. S1,S2 noted.  No murmur, rubs or gallops noted. No JVD or BLE edema. No carotid bruits noted. Pulmonary/Chest: Normal effort and positive vesicular breath sounds. No respiratory distress. No wheezes, rales or ronchi noted.  Musculoskeletal: No peripheral edema noted in the lower extremities.  No sores or ulcers present on feet. Neurological: Alert and oriented.  Psychiatric: Mood and affect normal. Behavior is normal. Judgment and thought content normal.     BMET    Component Value Date/Time   NA 138 09/14/2015 1139   K 4.5 09/14/2015 1139   CL 106 09/14/2015 1139   CO2 27 09/14/2015 1139   GLUCOSE 188 (H) 09/14/2015 1139   BUN 18 09/14/2015 1139   CREATININE 1.01 09/14/2015 1139   CALCIUM 8.9 09/14/2015 1139   GFRNONAA >60 04/14/2015 0550   GFRAA >60 04/14/2015 0550    Lipid Panel     Component Value Date/Time   CHOL 110 (L) 09/14/2015 1139   TRIG 271 (H) 09/14/2015 1139   HDL 31 (L) 09/14/2015 1139    CHOLHDL 3.5 09/14/2015 1139   VLDL 54 (H) 09/14/2015 1139   LDLCALC 25 09/14/2015 1139    CBC    Component Value Date/Time   WBC 9.6 09/14/2015 1139   RBC 4.95 09/14/2015 1139   HGB 14.3 09/14/2015 1139   HCT 43.2 09/14/2015 1139   PLT 227 09/14/2015 1139   MCV 87.3 09/14/2015 1139   MCH 28.9 09/14/2015 1139   MCHC 33.1 09/14/2015 1139   RDW 15.9 (H) 09/14/2015 1139   LYMPHSABS 2.2 09/14/2015 1139   MONOABS 1.1 (H) 09/14/2015 1139   EOSABS 0.3 09/14/2015 1139   BASOSABS 0.0 09/14/2015 1139  Hgb A1C Lab Results  Component Value Date   HGBA1C 9.2 (H) 09/14/2015     Assessment and Plan:

## 2016-05-09 NOTE — Assessment & Plan Note (Signed)
Continue Crestor Will check lipid panel today Continue low fat diet

## 2016-05-09 NOTE — Assessment & Plan Note (Addendum)
Continue MTX Continue to follow-up with [facility]

## 2016-05-09 NOTE — Assessment & Plan Note (Addendum)
Continue Lantus and Humalog Will check HbA1C today No microalbumin secondary to ARB therapy Foot exam today Continue regular foot and eye exams Encouraged him to consume a low fat, low carb diet

## 2016-05-09 NOTE — Patient Instructions (Signed)

## 2016-05-09 NOTE — Assessment & Plan Note (Addendum)
Well-controlled on Losartan HCT and Amlodipine Will check CMET today Will continue to monitor

## 2016-05-19 DIAGNOSIS — R351 Nocturia: Secondary | ICD-10-CM | POA: Diagnosis not present

## 2016-05-29 DIAGNOSIS — M2550 Pain in unspecified joint: Secondary | ICD-10-CM | POA: Diagnosis not present

## 2016-05-29 DIAGNOSIS — Z79899 Other long term (current) drug therapy: Secondary | ICD-10-CM | POA: Diagnosis not present

## 2016-05-29 DIAGNOSIS — M0579 Rheumatoid arthritis with rheumatoid factor of multiple sites without organ or systems involvement: Secondary | ICD-10-CM | POA: Diagnosis not present

## 2016-07-06 ENCOUNTER — Other Ambulatory Visit: Payer: Self-pay

## 2016-07-06 MED ORDER — ROSUVASTATIN CALCIUM 20 MG PO TABS: 20.0000 mg | ORAL_TABLET | Freq: Every day | ORAL | 1 refills | Status: DC

## 2016-07-13 ENCOUNTER — Encounter: Payer: Self-pay | Admitting: Internal Medicine

## 2016-08-29 DIAGNOSIS — M05721 Rheumatoid arthritis with rheumatoid factor of right elbow without organ or systems involvement: Secondary | ICD-10-CM | POA: Diagnosis not present

## 2016-08-29 DIAGNOSIS — M05711 Rheumatoid arthritis with rheumatoid factor of right shoulder without organ or systems involvement: Secondary | ICD-10-CM | POA: Diagnosis not present

## 2016-08-29 DIAGNOSIS — M0579 Rheumatoid arthritis with rheumatoid factor of multiple sites without organ or systems involvement: Secondary | ICD-10-CM | POA: Diagnosis not present

## 2016-08-29 DIAGNOSIS — M2550 Pain in unspecified joint: Secondary | ICD-10-CM | POA: Diagnosis not present

## 2016-08-29 DIAGNOSIS — Z79899 Other long term (current) drug therapy: Secondary | ICD-10-CM | POA: Diagnosis not present

## 2016-10-02 ENCOUNTER — Other Ambulatory Visit: Payer: Self-pay | Admitting: Internal Medicine

## 2016-10-02 DIAGNOSIS — Z794 Long term (current) use of insulin: Principal | ICD-10-CM

## 2016-10-02 DIAGNOSIS — IMO0002 Reserved for concepts with insufficient information to code with codable children: Secondary | ICD-10-CM

## 2016-10-02 DIAGNOSIS — E1165 Type 2 diabetes mellitus with hyperglycemia: Secondary | ICD-10-CM

## 2016-11-05 ENCOUNTER — Other Ambulatory Visit: Payer: Self-pay | Admitting: Internal Medicine

## 2016-11-05 DIAGNOSIS — I10 Essential (primary) hypertension: Secondary | ICD-10-CM

## 2016-11-07 ENCOUNTER — Ambulatory Visit (INDEPENDENT_AMBULATORY_CARE_PROVIDER_SITE_OTHER): Payer: Medicare Other | Admitting: Internal Medicine

## 2016-11-07 ENCOUNTER — Encounter: Payer: Self-pay | Admitting: Internal Medicine

## 2016-11-07 VITALS — BP 130/68 | HR 65 | Temp 98.0°F | Ht 67.0 in | Wt 230.0 lb

## 2016-11-07 DIAGNOSIS — M25551 Pain in right hip: Secondary | ICD-10-CM

## 2016-11-07 DIAGNOSIS — M1990 Unspecified osteoarthritis, unspecified site: Secondary | ICD-10-CM

## 2016-11-07 DIAGNOSIS — Z794 Long term (current) use of insulin: Secondary | ICD-10-CM

## 2016-11-07 DIAGNOSIS — Z0000 Encounter for general adult medical examination without abnormal findings: Secondary | ICD-10-CM

## 2016-11-07 DIAGNOSIS — Z23 Encounter for immunization: Secondary | ICD-10-CM

## 2016-11-07 DIAGNOSIS — Z125 Encounter for screening for malignant neoplasm of prostate: Secondary | ICD-10-CM

## 2016-11-07 DIAGNOSIS — E119 Type 2 diabetes mellitus without complications: Secondary | ICD-10-CM

## 2016-11-07 DIAGNOSIS — R600 Localized edema: Secondary | ICD-10-CM

## 2016-11-07 DIAGNOSIS — E1159 Type 2 diabetes mellitus with other circulatory complications: Secondary | ICD-10-CM | POA: Diagnosis not present

## 2016-11-07 DIAGNOSIS — E7800 Pure hypercholesterolemia, unspecified: Secondary | ICD-10-CM | POA: Diagnosis not present

## 2016-11-07 DIAGNOSIS — I10 Essential (primary) hypertension: Secondary | ICD-10-CM

## 2016-11-07 DIAGNOSIS — C801 Malignant (primary) neoplasm, unspecified: Secondary | ICD-10-CM

## 2016-11-07 MED ORDER — FUROSEMIDE 20 MG PO TABS: 20.0000 mg | ORAL_TABLET | Freq: Every day | ORAL | 0 refills | Status: DC

## 2016-11-07 MED ORDER — METOPROLOL SUCCINATE ER 25 MG PO TB24: 25.0000 mg | ORAL_TABLET | Freq: Every day | ORAL | 0 refills | Status: DC

## 2016-11-07 NOTE — Progress Notes (Signed)
HPI:  Pt presents to the clinic today for his Medicare Wellness Exam. He is also due for follow up of chronic conditions.  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 He is taking MTX and Folic Acid. He follows with Dr. Trudie Reed.  DM 2: His last A1C was 7.5%, 04/2016. His sugars range 80-305. He is taking Humalog on asliding scale. He takes Lantus 75 units BID. He does not see an endocrinologist.His last eye exam was within the last year. He does not check his feet.   HLD: His last LDL was 45, 04/2016. He is taking Crestor as prescribed. He denies myalgias. He does consume some fried foods.  HTN: His BP today is 130/68. He is taking Losartan-HCT and Amlodipine as prescribed. He has noticed some swelling in his feet. He denies chest pain, chest tightness or shortness of breath. ECG from 03/2015 reviewed.  History of Colon Cancer: s/p surgery. He never had any chemo or radiation. He denies constipation, diarrhea or blood in his stool. He does not follow with oncology.  Past Medical History:  Diagnosis Date  . 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 Arthritis   . Diabetes mellitus   . Hyperlipidemia   . Hypertension   . Vitamin D deficiency     Current Outpatient Prescriptions  Medication Sig Dispense Refill  . amLODipine (NORVASC) 5 MG tablet Take 1 tablet (5 mg total) by mouth daily. 90 tablet 1  . aspirin 81 MG tablet Take 162 mg by mouth at bedtime.     . folic acid (FOLVITE) 1 MG tablet Take 1 mg by mouth every morning.     . insulin lispro (HUMALOG KWIKPEN) 100 UNIT/ML KiwkPen Inject 2-6 Units into the skin 3 (three) times daily as needed (high blood sugar). Sliding scale    . Insulin Pen Needle 31G X 5 MM MISC 1 each by Does not apply route 3 (three) times daily as needed. 300 each 4  . LANTUS SOLOSTAR 100 UNIT/ML Solostar Pen INJECT 75 UNITS UNDER THE SKIN TWICE A DAY 135 mL 0  . losartan-hydrochlorothiazide (HYZAAR) 100-25 MG  tablet TAKE 1 TABLET DAILY 90 tablet 1  . methotrexate (RHEUMATREX) 2.5 MG tablet Take 20 mg by mouth every Friday.    Marland Kitchen oxyCODONE (OXY IR/ROXICODONE) 5 MG immediate release tablet Take 1-2 tablets (5-10 mg total) by mouth every 6 (six) hours as needed for moderate pain, severe pain or breakthrough pain. 30 tablet 0  . rosuvastatin (CRESTOR) 20 MG tablet Take 1 tablet (20 mg total) by mouth at bedtime. 90 tablet 1   No current facility-administered medications for this visit.     No Known Allergies  Family History  Problem Relation Age of Onset  . Cancer Father     "all over"  . Coronary artery disease Brother   . Diabetes Brother   . Stroke Mother   . Diabetes Mother   . Colon cancer Neg Hx   . Esophageal cancer Neg Hx   . Rectal cancer Neg Hx   . Stomach cancer Neg Hx     Social History   Social History  . Marital status: Divorced    Spouse name: N/A  . Number of children: 2  . Years of education: N/A   Occupational History  . 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 Smoking status: Former Smoker    Types: Cigarettes    Quit date: 08/13/1994  .  Smokeless tobacco: Never Used  . Alcohol use No  . Drug use: No  . Sexual activity: Not Currently   Other Topics Concern  . Not on file   Social History Narrative  . No narrative on file    Hospitiliaztions: None  Health Maintenance:    Flu: never  Tetanus: 10/2015  Pneumovax: never  Prevnar: never  Zostavax: never  PSA: never  Colon Screening: 09/2014  Eye Doctor: yearly  Dental Exam: no, dentures   Providers:   PCP: Webb Silversmith, NP-C  Rheumatoloigist: Dr. Trudie Reed  Urologist: Dr. Vikki Ports   I have personally reviewed and have noted:  1. The patient's medical and social history 2. Their use of alcohol, tobacco or illicit drugs 3. Their current medications and supplements 4. The patient's functional ability including ADL's, fall risks, home safety risks and hearing or visual impairment. 5. Diet  and physical activities 6. Evidence for depression or mood disorder  Subjective:   Review of Systems:   Constitutional: Pt reports weight gain. Denies fever, malaise, fatigue, headache.  HEENT: Denies eye pain, eye redness, ear pain, ringing in the ears, wax buildup, runny nose, nasal congestion, bloody nose, or sore throat. Respiratory: Denies difficulty breathing, shortness of breath, cough or sputum production.   Cardiovascular: Pt reports swelling in his legs. Denies chest pain, chest tightness, palpitations or swelling in the hands.  Gastrointestinal: Denies abdominal pain, bloating, constipation, diarrhea or blood in the stool.  GU: Pt reports nocturia. Denies urgency, frequency, pain with urination, burning sensation, blood in urine, odor or discharge. Musculoskeletal: Pt reports hand stiffness and right hip pain. Denies decrease in range of motion, difficulty with gait, muscle pain.  Skin: Denies redness, rashes, lesions or ulcercations.  Neurological: Denies dizziness, difficulty with memory, difficulty with speech or problems with balance and coordination.  Psych: Denies anxiety, depression, SI/HI.  No other specific complaints in a complete review of systems (except as listed in HPI above).  Objective:  PE:  BP 130/68   Pulse 65   Temp 98 F (36.7 C) (Oral)   Ht 5\' 7"  (1.702 m)   Wt 230 lb (104.3 kg)   SpO2 97%   BMI 36.02 kg/m   Wt Readings from Last 3 Encounters:  05/09/16 222 lb (100.7 kg)  11/10/15 224 lb (101.6 kg)  10/20/15 223 lb (101.2 kg)    General: Appears his stated age, obese in NAD. Skin: Warm, dry and intact.  HEENT: Head: normal shape and size; Eyes: sclera white, no icterus, conjunctiva pink, PERRLA and EOMs intact; Ears: Tm's gray and intact, normal light reflex; Throat/Mouth: Teeth present, mucosa pink and moist, no exudate, lesions or ulcerations noted.  Neck: Neck supple, trachea midline. Cardiovascular: Normal rate and rhythm. S1,S2 noted.   No murmur, rubs or gallops noted. 2+ edema BLE edema. No carotid bruits noted. Pulmonary/Chest: Normal effort and positive vesicular breath sounds. No respiratory distress. No wheezes, rales or ronchi noted.  Abdomen: Soft and nontender. Normal bowel sounds. ? Ventral hernia. Liver, spleen and kidneys non palpable. Musculoskeletal: Normal flexion and extension of the right hip. Pain with internal rotation and palpation over the trochanteric bursa. Strength 5/5 BUE. Strength 4/5 RLE, 5/5 LLE.  Neurological: Alert and oriented. Cranial nerves II-XII grossly intact. Coordination normal.  Psychiatric: Mood and affect normal. Behavior is normal. Judgment and thought content normal.     BMET    Component Value Date/Time   NA 140 05/09/2016 1458   NA 141 01/19/2016   K 5.2 (H)  05/09/2016 1458   CL 106 05/09/2016 1458   CO2 28 05/09/2016 1458   GLUCOSE 196 (H) 05/09/2016 1458   BUN 30 (H) 05/09/2016 1458   BUN 34 (A) 01/19/2016   CREATININE 1.29 05/09/2016 1458   CREATININE 1.01 09/14/2015 1139   CALCIUM 9.7 05/09/2016 1458   GFRNONAA >60 04/14/2015 0550   GFRAA >60 04/14/2015 0550    Lipid Panel     Component Value Date/Time   CHOL 113 05/09/2016 1458   TRIG 195.0 (H) 05/09/2016 1458   HDL 29.40 (L) 05/09/2016 1458   CHOLHDL 4 05/09/2016 1458   VLDL 39.0 05/09/2016 1458   LDLCALC 45 05/09/2016 1458    CBC    Component Value Date/Time   WBC 7.6 01/19/2016   WBC 9.6 09/14/2015 1139   RBC 4.95 09/14/2015 1139   HGB 12.6 (A) 01/19/2016   HCT 38 (A) 01/19/2016   PLT 243 01/19/2016   MCV 87.3 09/14/2015 1139   MCH 28.9 09/14/2015 1139   MCHC 33.1 09/14/2015 1139   RDW 15.9 (H) 09/14/2015 1139   LYMPHSABS 2.2 09/14/2015 1139   MONOABS 1.1 (H) 09/14/2015 1139   EOSABS 0.3 09/14/2015 1139   BASOSABS 0.0 09/14/2015 1139    Hgb A1C Lab Results  Component Value Date   HGBA1C 7.5 (H) 05/09/2016      Assessment and Plan:   Medicare Annual Wellness Visit:  Diet: He does  eat meat. He consumes fruits and veggies daily. He does eat some fried food.  Physical activity: Sedentary Depression/mood screen: Negative Hearing: Intact to whispered voice Visual acuity: Grossly normal, performs annual eye exam  ADLs: Capable Fall risk: None Home safety: Good Cognitive evaluation: Intact to orientation, naming, recall and repetition EOL planning: No adv directives, full code/ I agree  Preventative Medicine: Flu shot UTD. T etanus UTD. He declines pneumovax, prevnar and zostovax due to being on MTX. PSA will be done today with labs. Colonoscopy UTD. Encouraged him to consume a balanced diet and exercise regimen. Advised his to see an eye doctor and dentist annually   Next appointment: 3 months, follow up A1C    BAITY, REGINA, NP  Edema:  Encouraged elevation Discussed low salt diet eRx for Lasix 30 mg daily x 3 days  Right hip pain:  Likely bursitis He is requesting xray today-ordered

## 2016-11-08 ENCOUNTER — Ambulatory Visit (INDEPENDENT_AMBULATORY_CARE_PROVIDER_SITE_OTHER)
Admission: RE | Admit: 2016-11-08 | Discharge: 2016-11-08 | Disposition: A | Discharge status: Home / Self Care | Payer: Medicare Other | Source: Ambulatory Visit | Attending: Internal Medicine | Admitting: Internal Medicine

## 2016-11-08 ENCOUNTER — Telehealth: Payer: Self-pay | Admitting: Radiology

## 2016-11-08 DIAGNOSIS — M25551 Pain in right hip: Secondary | ICD-10-CM

## 2016-11-08 DIAGNOSIS — M1611 Unilateral primary osteoarthritis, right hip: Secondary | ICD-10-CM | POA: Diagnosis not present

## 2016-11-08 LAB — HEMOGLOBIN A1C
Hgb A1c MFr Bld: 9.1 % — ABNORMAL HIGH (ref <5.7)
MEAN PLASMA GLUCOSE: 214 mg/dL

## 2016-11-08 LAB — COMPREHENSIVE METABOLIC PANEL
ALT: 43 U/L (ref 9–46)
AST: 42 U/L — AB (ref 10–35)
Albumin: 3.3 g/dL — ABNORMAL LOW (ref 3.6–5.1)
Alkaline Phosphatase: 78 U/L (ref 40–115)
BILIRUBIN TOTAL: 0.8 mg/dL (ref 0.2–1.2)
BUN: 25 mg/dL (ref 7–25)
CALCIUM: 8.9 mg/dL (ref 8.6–10.3)
CHLORIDE: 107 mmol/L (ref 98–110)
CO2: 23 mmol/L (ref 20–31)
Creat: 1.14 mg/dL (ref 0.70–1.18)
GLUCOSE: 310 mg/dL — AB (ref 65–99)
Potassium: 4.8 mmol/L (ref 3.5–5.3)
SODIUM: 141 mmol/L (ref 135–146)
Total Protein: 6 g/dL — ABNORMAL LOW (ref 6.1–8.1)

## 2016-11-08 LAB — CBC WITH DIFFERENTIAL/PLATELET
Basophils Absolute: 0 cells/uL (ref 0–200)
Basophils Relative: 0 %
EOS PCT: 3 %
Eosinophils Absolute: 276 cells/uL (ref 15–500)
HEMATOCRIT: 41.7 % (ref 38.5–50.0)
HEMOGLOBIN: 13.5 g/dL (ref 13.2–17.1)
Lymphocytes Relative: 23 %
Lymphs Abs: 2116 cells/uL (ref 850–3900)
MCH: 29.2 pg (ref 27.0–33.0)
MCHC: 32.4 g/dL (ref 32.0–36.0)
MCV: 90.1 fL (ref 80.0–100.0)
MONO ABS: 920 {cells}/uL (ref 200–950)
MPV: 11.5 fL (ref 7.5–12.5)
Monocytes Relative: 10 %
NEUTROS PCT: 64 %
Neutro Abs: 5888 cells/uL (ref 1500–7800)
Platelets: 242 10*3/uL (ref 140–400)
RBC: 4.63 MIL/uL (ref 4.20–5.80)
RDW: 15.7 % — ABNORMAL HIGH (ref 11.0–15.0)
WBC: 9.2 10*3/uL (ref 3.8–10.8)

## 2016-11-08 LAB — LIPID PANEL
CHOL/HDL RATIO: 4.4 ratio (ref <5.0)
CHOLESTEROL: 115 mg/dL (ref <200)
HDL: 26 mg/dL — AB (ref >40)
LDL Cholesterol: 49 mg/dL (ref <100)
Triglycerides: 202 mg/dL — ABNORMAL HIGH (ref <150)
VLDL: 40 mg/dL — ABNORMAL HIGH (ref <30)

## 2016-11-08 LAB — PSA: PSA: 4.4 ng/mL — AB (ref <=4.0)

## 2016-11-08 NOTE — Assessment & Plan Note (Signed)
Continue MTX and Folic Acid He will continue to follow with Dr. Trudie Reed

## 2016-11-08 NOTE — Patient Instructions (Signed)

## 2016-11-08 NOTE — Telephone Encounter (Signed)
X ray done

## 2016-11-08 NOTE — Assessment & Plan Note (Signed)
CMET and Lipid profile today Encouraged him to consume a low fat diet Continue Crestor for now- will adjust if needed based on labs

## 2016-11-08 NOTE — Assessment & Plan Note (Signed)
Uncontrolled Discussed the importance of diet and exercise Continue Lisinopril HCT and Amlodipine Add Metoprolol daily  RTC in 3 weeks to follow up HTN

## 2016-11-08 NOTE — Assessment & Plan Note (Signed)
A1C today Microalbumin not needed secondary to ACEI therapy Discussed the importance of diet and exercise Continue Humalog and Lantus for now- will adjust if needed based on labs Encouraged yearly eye exams Foot exam today Flu shot today He declines pneumonia vaccines

## 2016-11-08 NOTE — Assessment & Plan Note (Signed)
Continue colonoscopys

## 2016-11-08 NOTE — Telephone Encounter (Signed)
LMOM to return for an x ray ordered by Webb Silversmith.

## 2016-11-10 MED ORDER — FUROSEMIDE 20 MG PO TABS: 20.0000 mg | ORAL_TABLET | Freq: Every day | ORAL | 2 refills | Status: DC

## 2016-11-10 NOTE — Addendum Note (Signed)
Addended by: Lurlean Nanny on: 11/10/2016 10:14 AM   Modules accepted: Orders

## 2016-11-28 ENCOUNTER — Encounter: Payer: Self-pay | Admitting: Internal Medicine

## 2016-11-28 ENCOUNTER — Ambulatory Visit (INDEPENDENT_AMBULATORY_CARE_PROVIDER_SITE_OTHER): Payer: Medicare Other | Admitting: Internal Medicine

## 2016-11-28 VITALS — BP 130/68 | HR 58 | Temp 97.5°F | Wt 229.0 lb

## 2016-11-28 DIAGNOSIS — I10 Essential (primary) hypertension: Secondary | ICD-10-CM

## 2016-11-28 NOTE — Progress Notes (Signed)
Subjective:    Patient ID: Jake Gross, male    DOB: [birth year], 76 y.o.   MRN: [id number]  HPI  Pt presents to the clinic today for 3 week follow up of HTN. At his last visit, his BP was 130/68. Metoprolol was added in addition to his Lisinopril HCT and Amlodipine. He has been taking the medication as prescribed. His BP today is 130/68. ECG from 03/2015 reviewed.  Review of Systems      Past Medical History:  Diagnosis Date  . 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 Arthritis   . Diabetes mellitus   . Hyperlipidemia   . Hypertension   . Vitamin D deficiency     Current Outpatient Prescriptions  Medication Sig Dispense Refill  . aspirin 81 MG tablet Take 162 mg by mouth at bedtime.     . folic acid (FOLVITE) 1 MG tablet Take 1 mg by mouth every morning.     . furosemide (LASIX) 20 MG tablet Take 1 tablet (20 mg total) by mouth daily. 90 tablet 2  . insulin lispro (HUMALOG KWIKPEN) 100 UNIT/ML KiwkPen Inject 10 Units into the skin 2 (two) times daily before a meal. 10 units at breakfast, 10 units at dinner    . Insulin Pen Needle 31G X 5 MM MISC 1 each by Does not apply route 3 (three) times daily as needed. 300 each 4  . LANTUS SOLOSTAR 100 UNIT/ML Solostar Pen INJECT 75 UNITS UNDER THE SKIN TWICE A DAY 135 mL 0  . losartan-hydrochlorothiazide (HYZAAR) 100-25 MG tablet TAKE 1 TABLET DAILY 90 tablet 1  . methotrexate (RHEUMATREX) 2.5 MG tablet Take 20 mg by mouth every Friday.    . metoprolol succinate (TOPROL-XL) 25 MG 24 hr tablet Take 1 tablet (25 mg total) by mouth daily. 30 tablet 0  . oxyCODONE (OXY IR/ROXICODONE) 5 MG immediate release tablet Take 1-2 tablets (5-10 mg total) by mouth every 6 (six) hours as needed for moderate pain, severe pain or breakthrough pain. 30 tablet 0  . rosuvastatin (CRESTOR) 20 MG tablet Take 1 tablet (20 mg total) by mouth at bedtime. 90 tablet 1   No current facility-administered  medications for this visit.     No Known Allergies  Family History  Problem Relation Age of Onset  . Cancer Father     "all over"  . Coronary artery disease Brother   . Diabetes Brother   . Stroke Mother   . Diabetes Mother   . Colon cancer Neg Hx   . Esophageal cancer Neg Hx   . Rectal cancer Neg Hx   . Stomach cancer Neg Hx     Social History   Social History  . Marital status: Divorced    Spouse name: N/A  . Number of children: 2  . Years of education: N/A   Occupational History  . 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 Smoking status: Former Smoker    Types: Cigarettes    Quit date: 08/13/1994  . Smokeless tobacco: Never Used  . Alcohol use No  . Drug use: No  . Sexual activity: Not Currently   Other Topics Concern  . Not on file   Social History Narrative  . No narrative on file     Constitutional: Denies fever, malaise, fatigue, headache or abrupt weight changes.  Respiratory: Denies difficulty breathing, shortness of breath, cough or sputum production.   Cardiovascular: Denies chest pain,  chest tightness, palpitations or swelling in the hands or feet.  Neurological: Denies dizziness, difficulty with memory, difficulty with speech or problems with balance and coordination.    No other specific complaints in a complete review of systems (except as listed in HPI above).  Objective:   Physical Exam  BP 130/68   Pulse (!) 58   Temp 97.5 F (36.4 C) (Oral)   Wt 229 lb (103.9 kg)   SpO2 98%   BMI 35.87 kg/m  Wt Readings from Last 3 Encounters:  11/28/16 229 lb (103.9 kg)  11/07/16 230 lb (104.3 kg)  05/09/16 222 lb (100.7 kg)    General: Appears her stated age, well developed, well nourished in NAD. SCardiovascular: Normal rate and rhythm.  Pulmonary/Chest: Normal effort and positive vesicular breath sounds. No respiratory distress. No wheezes, rales or ronchi noted.  Neurological: Alert and oriented.    BMET    Component Value  Date/Time   NA 141 11/07/2016 1514   NA 141 01/19/2016   K 4.8 11/07/2016 1514   CL 107 11/07/2016 1514   CO2 23 11/07/2016 1514   GLUCOSE 310 (H) 11/07/2016 1514   BUN 25 11/07/2016 1514   BUN 34 (A) 01/19/2016   CREATININE 1.14 11/07/2016 1514   CALCIUM 8.9 11/07/2016 1514   GFRNONAA >60 04/14/2015 0550   GFRAA >60 04/14/2015 0550    Lipid Panel     Component Value Date/Time   CHOL 115 11/07/2016 1514   TRIG 202 (H) 11/07/2016 1514   HDL 26 (L) 11/07/2016 1514   CHOLHDL 4.4 11/07/2016 1514   VLDL 40 (H) 11/07/2016 1514   LDLCALC 49 11/07/2016 1514    CBC    Component Value Date/Time   WBC 9.2 11/07/2016 1514   RBC 4.63 11/07/2016 1514   HGB 13.5 11/07/2016 1514   HCT 41.7 11/07/2016 1514   PLT 242 11/07/2016 1514   MCV 90.1 11/07/2016 1514   MCH 29.2 11/07/2016 1514   MCHC 32.4 11/07/2016 1514   RDW 15.7 (H) 11/07/2016 1514   LYMPHSABS 2,116 11/07/2016 1514   MONOABS 920 11/07/2016 1514   EOSABS 276 11/07/2016 1514   BASOSABS 0 11/07/2016 1514    Hgb A1C Lab Results  Component Value Date   HGBA1C 9.1 (H) 11/07/2016       Assessment & Plan:

## 2016-11-28 NOTE — Assessment & Plan Note (Signed)
Metoprolol no help, will d/c Continue Lisinopril HCT and Amlodipine If swelling occurs, okay to take Lasix daily prn

## 2016-11-28 NOTE — Patient Instructions (Signed)
Hypertension Hypertension is another name for high blood pressure. High blood pressure forces your heart to work harder to pump blood. A blood pressure reading has two numbers, which includes a higher number over a lower number (example: 110/72). Follow these instructions at home:  Have your blood pressure rechecked by your doctor.  Only take medicine as told by your doctor. Follow the directions carefully. The medicine does not work as well if you skip doses. Skipping doses also puts you at risk for problems.  Do not smoke.  Monitor your blood pressure at home as told by your doctor. Contact a doctor if:  You think you are having a reaction to the medicine you are taking.  You have repeat headaches or feel dizzy.  You have puffiness (swelling) in your ankles.  You have trouble with your vision. Get help right away if:  You get a very bad headache and are confused.  You feel weak, numb, or faint.  You get chest or belly (abdominal) pain.  You throw up (vomit).  You cannot breathe very well. This information is not intended to replace advice given to you by your health care provider. Make sure you discuss any questions you have with your health care provider. Document Released: 03/20/2008 Document Revised: 03/09/2016 Document Reviewed: 07/25/2013 Elsevier Interactive Patient Education  2017 [employer]  

## 2016-12-31 ENCOUNTER — Other Ambulatory Visit: Payer: Self-pay | Admitting: Internal Medicine

## 2016-12-31 DIAGNOSIS — IMO0002 Reserved for concepts with insufficient information to code with codable children: Secondary | ICD-10-CM

## 2016-12-31 DIAGNOSIS — E1165 Type 2 diabetes mellitus with hyperglycemia: Secondary | ICD-10-CM

## 2016-12-31 DIAGNOSIS — Z794 Long term (current) use of insulin: Principal | ICD-10-CM

## 2017-01-02 ENCOUNTER — Other Ambulatory Visit: Payer: Self-pay | Admitting: Internal Medicine

## 2017-01-30 DIAGNOSIS — H2513 Age-related nuclear cataract, bilateral: Secondary | ICD-10-CM | POA: Diagnosis not present

## 2017-01-30 DIAGNOSIS — E119 Type 2 diabetes mellitus without complications: Secondary | ICD-10-CM | POA: Diagnosis not present

## 2017-02-05 ENCOUNTER — Encounter: Payer: Self-pay | Admitting: Internal Medicine

## 2017-02-05 ENCOUNTER — Ambulatory Visit (INDEPENDENT_AMBULATORY_CARE_PROVIDER_SITE_OTHER): Payer: Medicare Other | Admitting: Internal Medicine

## 2017-02-05 VITALS — BP 130/66 | HR 51 | Temp 97.5°F | Wt 230.0 lb

## 2017-02-05 DIAGNOSIS — E1165 Type 2 diabetes mellitus with hyperglycemia: Secondary | ICD-10-CM | POA: Diagnosis not present

## 2017-02-05 DIAGNOSIS — Z794 Long term (current) use of insulin: Secondary | ICD-10-CM

## 2017-02-05 MED ORDER — INSULIN GLARGINE 100 UNIT/ML SOLOSTAR PEN: PEN_INJECTOR | SUBCUTANEOUS | 0 refills | Status: DC

## 2017-02-05 MED ORDER — ROSUVASTATIN CALCIUM 20 MG PO TABS: 20.0000 mg | ORAL_TABLET | Freq: Every day | ORAL | 1 refills | Status: DC

## 2017-02-05 NOTE — Assessment & Plan Note (Signed)
Will check A1C today If A1C > 8, will refer to endocrinology Refilled Lantus, Crestor and strips today Foot exam UTD Encouraged yearly eye exam Advised him to consume a low fat, low carb diet and exercise for weight loss

## 2017-02-05 NOTE — Progress Notes (Signed)
Subjective:    Patient ID: [employer], male    DOB: 10/06/1941, 76 y.o.   MRN: [id number]  HPI  Pt presents to the clinic today for 3 month follow up of DM 2. At his last visit, his A1C was 9.1%. He was continued on his Lantus 75 mg BID. He was advised to start taking his Humalog 10 units BID, instead of on a sliding scale as he previously was. He reports his sugars have not changed, but he could not give me specific numbers. He denies hypoglycemia. He does not check his feet. His last annual exam was less than 1 year ago. He does reports urinary frequency and visual changes but denies increased thirst, non healing wounds or numbness and tingling in his hands or feet.  Review of Systems      Past Medical History:  Diagnosis Date  . 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 Arthritis   . Diabetes mellitus   . Hyperlipidemia   . Hypertension   . Vitamin D deficiency     Current Outpatient Prescriptions  Medication Sig Dispense Refill  . aspirin 81 MG tablet Take 162 mg by mouth at bedtime.     . folic acid (FOLVITE) 1 MG tablet Take 1 mg by mouth every morning.     . insulin lispro (HUMALOG KWIKPEN) 100 UNIT/ML KiwkPen Inject 10 Units into the skin 2 (two) times daily before a meal. 10 units at breakfast, 10 units at dinner    . Insulin Pen Needle 31G X 5 MM MISC 1 each by Does not apply route 3 (three) times daily as needed. 300 each 4  . LANTUS SOLOSTAR 100 UNIT/ML Solostar Pen INJECT 75 UNITS UNDER THE SKIN TWICE A DAY 135 mL 0  . losartan-hydrochlorothiazide (HYZAAR) 100-25 MG tablet TAKE 1 TABLET DAILY 90 tablet 1  . methotrexate (RHEUMATREX) 2.5 MG tablet Take 20 mg by mouth every Friday.    Marland Kitchen oxyCODONE (OXY IR/ROXICODONE) 5 MG immediate release tablet Take 1-2 tablets (5-10 mg total) by mouth every 6 (six) hours as needed for moderate pain, severe pain or breakthrough pain. 30 tablet 0  . rosuvastatin (CRESTOR) 20 MG tablet  TAKE 1 TABLET AT BEDTIME 90 tablet 1   No current facility-administered medications for this visit.     No Known Allergies  Family History  Problem Relation Age of Onset  . Cancer Father     "all over"  . Coronary artery disease Brother   . Diabetes Brother   . Stroke Mother   . Diabetes Mother   . Colon cancer Neg Hx   . Esophageal cancer Neg Hx   . Rectal cancer Neg Hx   . Stomach cancer Neg Hx     Social History   Social History  . Marital status: Divorced    Spouse name: N/A  . Number of children: 2  . Years of education: N/A   Occupational History  . 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 Smoking status: Former Smoker    Types: Cigarettes    Quit date: 08/13/1994  . Smokeless tobacco: Never Used  . Alcohol use No  . Drug use: No  . Sexual activity: Not Currently   Other Topics Concern  . Not on file   Social History Narrative  . No narrative on file     Constitutional: Denies fever, malaise, fatigue, headache or abrupt weight changes.  HEENT: Pt  reports visual changes. Denies eye pain, eye redness, ear pain, ringing in the ears, wax buildup, runny nose, nasal congestion, bloody nose, or sore throat. Respiratory: Denies difficulty breathing, shortness of breath, cough or sputum production.   Cardiovascular: Denies chest pain, chest tightness, palpitations or swelling in the hands or feet.  Gastrointestinal: Denies abdominal pain, bloating, constipation, diarrhea or blood in the stool.  GU: Pt reports urinary frequency. Denies urgency, pain with urination, burning sensation, blood in urine, odor or discharge. Skin: Denies redness, rashes, lesions or ulcercations.  Neurological: Denies dizziness, difficulty with memory, difficulty with speech or problems with balance and coordination.    No other specific complaints in a complete review of systems (except as listed in HPI above).  Objective:   Physical Exam   BP 130/66   Pulse (!) 51   Temp  97.5 F (36.4 C) (Oral)   Wt 230 lb (104.3 kg)   SpO2 96%   BMI 36.02 kg/m  Wt Readings from Last 3 Encounters:  02/05/17 230 lb (104.3 kg)  11/28/16 229 lb (103.9 kg)  11/07/16 230 lb (104.3 kg)    General: Appears his stated age, obese in NAD. Skin: Warm, dry and intact. No ulcerations noted. Cardiovascular: Normal rate and rhythm.  Pulmonary/Chest: Normal effort and positive vesicular breath sounds. No respiratory distress. No wheezes, rales or ronchi noted.  Neurological: Alert and oriented. Sensation intact to BLE.   BMET    Component Value Date/Time   NA 141 11/07/2016 1514   NA 141 01/19/2016   K 4.8 11/07/2016 1514   CL 107 11/07/2016 1514   CO2 23 11/07/2016 1514   GLUCOSE 310 (H) 11/07/2016 1514   BUN 25 11/07/2016 1514   BUN 34 (A) 01/19/2016   CREATININE 1.14 11/07/2016 1514   CALCIUM 8.9 11/07/2016 1514   GFRNONAA >60 04/14/2015 0550   GFRAA >60 04/14/2015 0550    Lipid Panel     Component Value Date/Time   CHOL 115 11/07/2016 1514   TRIG 202 (H) 11/07/2016 1514   HDL 26 (L) 11/07/2016 1514   CHOLHDL 4.4 11/07/2016 1514   VLDL 40 (H) 11/07/2016 1514   LDLCALC 49 11/07/2016 1514    CBC    Component Value Date/Time   WBC 9.2 11/07/2016 1514   RBC 4.63 11/07/2016 1514   HGB 13.5 11/07/2016 1514   HCT 41.7 11/07/2016 1514   PLT 242 11/07/2016 1514   MCV 90.1 11/07/2016 1514   MCH 29.2 11/07/2016 1514   MCHC 32.4 11/07/2016 1514   RDW 15.7 (H) 11/07/2016 1514   LYMPHSABS 2,116 11/07/2016 1514   MONOABS 920 11/07/2016 1514   EOSABS 276 11/07/2016 1514   BASOSABS 0 11/07/2016 1514    Hgb A1C Lab Results  Component Value Date   HGBA1C 9.1 (H) 11/07/2016           Assessment & Plan:

## 2017-02-05 NOTE — Patient Instructions (Signed)
Hyperglycemia Hyperglycemia is when the sugar (glucose) level in your blood is too high. It may not cause symptoms. If you do have symptoms, they may include warning signs, such as:  Feeling more thirsty than normal.  Hunger.  Feeling tired.  Needing to pee (urinate) more than normal.  Blurry eyesight (vision). You may get other symptoms as it gets worse, such as:  Dry mouth.  Not being hungry (loss of appetite).  Fruity-smelling breath.  Weakness.  Weight gain or loss that is not planned. Weight loss may be fast.  A tingling or numb feeling in your hands or feet.  Headache.  Skin that does not bounce back quickly when it is lightly pinched and released (poor skin turgor).  Pain in your belly (abdomen).  Cuts or bruises that heal slowly. High blood sugar can happen to people who do or do not have diabetes. High blood sugar can happen slowly or quickly, and it can be an emergency. Follow these instructions at home: General instructions  Take over-the-counter and prescription medicines only as told by your doctor.  Do not use products that contain nicotine or tobacco, such as cigarettes and e-cigarettes. If you need help quitting, ask your doctor.  Limit alcohol intake to no more than 1 drink per day for nonpregnant women and 2 drinks per day for men. One drink equals 12 oz of beer, 5 oz of wine, or 1 oz of hard liquor.  Manage stress. If you need help with this, ask your doctor.  Keep all follow-up visits as told by your doctor. This is important. Eating and drinking  Stay at a healthy weight.  Exercise regularly, as told by your doctor.  Drink enough fluid, especially when you:  Exercise.  Get sick.  Are in hot temperatures.  Eat healthy foods, such as:  Low-fat (lean) proteins.  Complex carbs (complex carbohydrates), such as whole wheat bread or brown rice.  Fresh fruits and vegetables.  Low-fat dairy products.  Healthy fats.  Drink enough  fluid to keep your pee (urine) clear or pale yellow. If you have diabetes:  Make sure you know the symptoms of hyperglycemia.  Follow your diabetes management plan, as told by your doctor. Make sure you:  Take insulin and medicines as told.  Follow your exercise plan.  Follow your meal plan. Eat on time. Do not skip meals.  Check your blood sugar as often as told. Make sure to check before and after exercise. If you exercise longer or in a different way than you normally do, check your blood sugar more often.  Follow your sick day plan whenever you cannot eat or drink normally. Make this plan ahead of time with your doctor.  Share your diabetes management plan with people in your workplace, school, and household.  Check your urine for ketones when you are ill and as told by your doctor.  Carry a card or wear jewelry that says that you have diabetes. Contact a doctor if:  Your blood sugar level is higher than 240 mg/dL (13.3 mmol/L) for 2 days in a row.  You have problems keeping your blood sugar in your target range.  High blood sugar happens often for you. Get help right away if:  You have trouble breathing.  You have a change in how you think, feel, or act (mental status).  You feel sick to your stomach (nauseous), and that feeling does not go away.  You cannot stop throwing up (vomiting). These symptoms may be an   emergency. Do not wait to see if the symptoms will go away. Get medical help right away. Call your local emergency services (911 in the [place] Do not drive yourself to the hospital.  Summary  Hyperglycemia is when the sugar (glucose) level in your blood is too high.  High blood sugar can happen to people who do or do not have diabetes.  Make sure you drink enough fluids, eat healthy foods, and exercise regularly.  Contact your doctor if you have problems keeping your blood sugar in your target range. This information is not intended to replace advice given  to you by your health care provider. Make sure you discuss any questions you have with your health care provider. Document Released: 07/30/2009 Document Revised: 06/19/2016 Document Reviewed: 06/19/2016 Elsevier Interactive Patient Education  2017 Elsevier Inc.  

## 2017-02-06 ENCOUNTER — Other Ambulatory Visit: Payer: Self-pay | Admitting: Internal Medicine

## 2017-02-06 DIAGNOSIS — Z794 Long term (current) use of insulin: Principal | ICD-10-CM

## 2017-02-06 DIAGNOSIS — E1165 Type 2 diabetes mellitus with hyperglycemia: Secondary | ICD-10-CM

## 2017-02-06 LAB — HEMOGLOBIN A1C
HEMOGLOBIN A1C: 10.4 % — AB (ref <5.7)
Mean Plasma Glucose: 252 mg/dL

## 2017-02-27 ENCOUNTER — Encounter: Payer: Self-pay | Admitting: Endocrinology

## 2017-02-27 ENCOUNTER — Ambulatory Visit (INDEPENDENT_AMBULATORY_CARE_PROVIDER_SITE_OTHER): Payer: Medicare Other | Admitting: Endocrinology

## 2017-02-27 DIAGNOSIS — Z794 Long term (current) use of insulin: Secondary | ICD-10-CM | POA: Diagnosis not present

## 2017-02-27 DIAGNOSIS — E1165 Type 2 diabetes mellitus with hyperglycemia: Secondary | ICD-10-CM

## 2017-02-27 MED ORDER — INSULIN GLARGINE 100 UNIT/ML SOLOSTAR PEN: 75.0000 [IU] | PEN_INJECTOR | Freq: Every day | SUBCUTANEOUS | 3 refills | Status: DC

## 2017-02-27 MED ORDER — GLUCOSE BLOOD VI STRP: 1.0000 | ORAL_STRIP | Freq: Two times a day (BID) | 3 refills | Status: DC

## 2017-02-27 MED ORDER — INSULIN LISPRO 100 UNIT/ML (KWIKPEN): 60.0000 [IU] | PEN_INJECTOR | Freq: Two times a day (BID) | SUBCUTANEOUS | 3 refills | Status: DC

## 2017-02-27 NOTE — Patient Instructions (Addendum)
good diet and exercise significantly improve the control of your diabetes.  please let me know if you wish to be referred to a dietician.  high blood sugar is very risky to your health.  you should see an eye doctor and dentist every year.  It is very important to get all recommended vaccinations.  Controlling your blood pressure and cholesterol drastically reduces the damage diabetes does to your body.  Those who smoke should quit.  Please discuss these with your doctor.  check your blood sugar twice a day.  vary the time of day when you check, between before the 3 meals, and at bedtime.  also check if you have symptoms of your blood sugar being too high or too low.  please keep a record of the readings and bring it to your next appointment here (or you can bring the meter itself).  You can write it on any piece of paper.  please call us sooner if your blood sugar goes below 70, or if you have a lot of readings over 200. Please change the insulins to the numbers listed below. Please call us next week, to tell us how the blood sugar is doing. Please come back for a follow-up appointment in 2-3 weeks.

## 2017-02-27 NOTE — Progress Notes (Signed)
Subjective:    Patient ID: [employer], male    DOB: Mar 21, 1941, 76 y.o.   MRN: [id number]  HPI pt is referred by Webb Silversmith, NP, for diabetes.  Pt states DM was dx'ed in 1993; he has mild if any neuropathy of the lower extremities; he has associated retinopathy and renal insufficiency; he has been on insulin since 2005; pt says his diet is good, but exercise is not good; he has never had pancreatitis, pancreatic surgery, severe hypoglycemia or DKA.  Last episode of severe hypoglycemia was in 2014.  He takes multiple daily injections.  He says cbg's vary from 90-300.  It is in general higher as the day goes on.  He eats just 2 meals per day.  Past Medical History:  Diagnosis Date  . 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 Arthritis   . Diabetes mellitus   . Hyperlipidemia   . Hypertension   . Vitamin D deficiency     Past Surgical History:  Procedure Laterality Date  . ABCESS DRAINAGE Left    buttocks  . CARDIOVASCULAR STRESS TEST  10/12/1999   EF 63%. NO ISCHEMIA  . COLONOSCOPY W/ POLYPECTOMY     5 polyps  . EUS N/A 03/11/2015   Procedure: LOWER ENDOSCOPIC ULTRASOUND (EUS);  Surgeon: Milus Banister, MD;  Location: [facility];  Service: Endoscopy;  Laterality: N/A;  . FLEXIBLE SIGMOIDOSCOPY N/A 02/02/2015   Procedure: FLEXIBLE SIGMOIDOSCOPY;  Surgeon: Inda Castle, MD;  Location: [facility];  Service: Endoscopy;  Laterality: N/A;  ERBE  . PARTIAL PROCTECTOMY BY TEM N/A 04/08/2015   Procedure: TEM PARTIAL PROCTECTOMY OF RECTAL MASS;  Surgeon: Michael Boston, MD;  Location: [facility];  Service: General;  Laterality: N/A;  . TONSILLECTOMY AND ADENOIDECTOMY     as child    Social History   Social History  . Marital status: Divorced    Spouse name: N/A  . Number of children: 2  . Years of education: N/A   Occupational History  . 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 Smoking status: Former Smoker    Types:  Cigarettes    Quit date: 08/13/1994  . Smokeless tobacco: Never Used  . Alcohol use No  . Drug use: No  . Sexual activity: Not Currently   Other Topics Concern  . Not on file   Social History Narrative  . No narrative on file    Current Outpatient Prescriptions on File Prior to Visit  Medication Sig Dispense Refill  . aspirin 81 MG tablet Take 162 mg by mouth at bedtime.     . folic acid (FOLVITE) 1 MG tablet Take 1 mg by mouth every morning.     . Insulin Pen Needle 31G X 5 MM MISC 1 each by Does not apply route 3 (three) times daily as needed. 300 each 4  . losartan-hydrochlorothiazide (HYZAAR) 100-25 MG tablet TAKE 1 TABLET DAILY 90 tablet 1  . methotrexate (RHEUMATREX) 2.5 MG tablet Take 20 mg by mouth every Friday.    . rosuvastatin (CRESTOR) 20 MG tablet Take 1 tablet (20 mg total) by mouth at bedtime. 90 tablet 1   No current facility-administered medications on file prior to visit.     No Known Allergies  Family History  Problem Relation Age of Onset  . Cancer Father        "all over"  . Coronary artery disease Brother   . Diabetes Brother   .  Stroke Mother   . Diabetes Mother   . Colon cancer Neg Hx   . Esophageal cancer Neg Hx   . Rectal cancer Neg Hx   . Stomach cancer Neg Hx     BP (!) 146/80   Pulse 68   Ht 5\' 7"  (1.702 m)   Wt 228 lb (103.4 kg)   SpO2 96%   BMI 35.71 kg/m    Review of Systems denies weight loss, headache, chest pain, sob, n/v, muscle cramps, dry skin, memory loss, cold intolerance, rhinorrhea, and easy bruising.  He has chronically blurry vision and nocturia.      Objective:   Physical Exam VS: see vs page GEN: no distress HEAD: head: no deformity eyes: no periorbital swelling, no proptosis external nose and ears are normal mouth: no lesion seen NECK: supple, thyroid is not enlarged CHEST WALL: no deformity LUNGS: clear to auscultation CV: reg rate and rhythm, no murmur.  Slow capillary refill on the feet.  ABD: abdomen  is soft, nontender.  no hepatosplenomegaly.  not distended.  no hernia MUSCULOSKELETAL: muscle bulk and strength are grossly normal.  no obvious joint swelling.  gait is normal and steady EXTEMITIES: no deformity.  no ulcer on the feet.  feet are of normal color and temp.  Trace bilat leg edema.   PULSES: dorsalis pedis intact bilat.  no carotid bruit NEURO:  cn 2-12 grossly intact.   readily moves all 4's.  sensation is intact to touch on the feet.  SKIN:  Normal texture and temperature.  No rash or suspicious lesion is visible.   NODES:  None palpable at the neck.   PSYCH: alert, well-oriented.  Does not appear anxious nor depressed.   Lab Results  Component Value Date   CREATININE 1.14 11/07/2016   BUN 25 11/07/2016   NA 141 11/07/2016   K 4.8 11/07/2016   CL 107 11/07/2016   CO2 23 11/07/2016   Lab Results  Component Value Date   HGBA1C 10.4 (H) 02/05/2017   I personally reviewed electrocardiogram tracing (04/06/15): Indication: preop Impression: NSR.  No MI.  No hypertrophy. PRWP Compared to 2013: PRWP is new.   I have reviewed outside records, and summarized: Pt was noted to have severely elevated a1c, and referred here.  He was advised to add humalog, due to pattern of cbg's she noted.       Assessment & Plan:  Insulin-requiring type 2 DM, with retinopathy, new to me: severe exacerbation. The pattern of his cbg's indicates he needs more humalog.   Patient Instructions  good diet and exercise significantly improve the control of your diabetes.  please let me know if you wish to be referred to a dietician.  high blood sugar is very risky to your health.  you should see an eye doctor and dentist every year.  It is very important to get all recommended vaccinations.  Controlling your blood pressure and cholesterol drastically reduces the damage diabetes does to your body.  Those who smoke should quit.  Please discuss these with your doctor.  check your blood sugar twice a day.   vary the time of day when you check, between before the 3 meals, and at bedtime.  also check if you have symptoms of your blood sugar being too high or too low.  please keep a record of the readings and bring it to your next appointment here (or you can bring the meter itself).  You can write it on any piece of  paper.  please call us sooner if your blood sugar goes below 70, or if you have a lot of readings over 200. Please change the insulins to the numbers listed below. Please call us next week, to tell us how the blood sugar is doing. Please come back for a follow-up appointment in 2-3 weeks.

## 2017-03-20 ENCOUNTER — Ambulatory Visit (INDEPENDENT_AMBULATORY_CARE_PROVIDER_SITE_OTHER): Payer: Medicare Other | Admitting: Endocrinology

## 2017-03-20 DIAGNOSIS — E119 Type 2 diabetes mellitus without complications: Secondary | ICD-10-CM

## 2017-03-20 LAB — POCT GLYCOSYLATED HEMOGLOBIN (HGB A1C): HEMOGLOBIN A1C: 9.7

## 2017-03-20 MED ORDER — INSULIN LISPRO 100 UNIT/ML (KWIKPEN): 75.0000 [IU] | PEN_INJECTOR | Freq: Two times a day (BID) | SUBCUTANEOUS | 3 refills | Status: DC

## 2017-03-20 NOTE — Patient Instructions (Addendum)
check your blood sugar twice a day.  vary the time of day when you check, between before the 3 meals, and at bedtime.  also check if you have symptoms of your blood sugar being too high or too low.  please keep a record of the readings and bring it to your next appointment here (or you can bring the meter itself).  You can write it on any piece of paper.  please call us sooner if your blood sugar goes below 70, or if you have a lot of readings over 200. Please continue the same lantus.   Please increase the humalog to 75 units twice a day (just before meals).  Please call us next week, to tell us how the blood sugar is doing.  Please come back for a follow-up appointment in 1 month.

## 2017-03-20 NOTE — Progress Notes (Signed)
Subjective:    Patient ID: [employer], male    DOB: 1941/10/15, 76 y.o.   MRN: [id number]  HPI Pt returns for f/u of diabetes mellitus:  DM type:  Dx'ed: 6314 Complications: polyneuropathy, retinopathy, and renal insufficiency Therapy: insulin since 2005 DKA: never Severe hypoglycemia: last episode was in 2014 Pancreatitis: never Pancreatic imaging: normal CT in 2006 Other: he eats 2 meals per day; he takes multiple daily injections Interval history: no cbg record, but states cbg's vary from 241-353.  It is in general higher as the day goes on.  pt states he feels well in general.   Past Medical History:  Diagnosis Date  . 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 Arthritis   . Diabetes mellitus   . Hyperlipidemia   . Hypertension   . Vitamin D deficiency     Past Surgical History:  Procedure Laterality Date  . ABCESS DRAINAGE Left    buttocks  . CARDIOVASCULAR STRESS TEST  10/12/1999   EF 63%. NO ISCHEMIA  . COLONOSCOPY W/ POLYPECTOMY     5 polyps  . EUS N/A 03/11/2015   Procedure: LOWER ENDOSCOPIC ULTRASOUND (EUS);  Surgeon: Milus Banister, MD;  Location: [facility];  Service: Endoscopy;  Laterality: N/A;  . FLEXIBLE SIGMOIDOSCOPY N/A 02/02/2015   Procedure: FLEXIBLE SIGMOIDOSCOPY;  Surgeon: Inda Castle, MD;  Location: [facility];  Service: Endoscopy;  Laterality: N/A;  ERBE  . PARTIAL PROCTECTOMY BY TEM N/A 04/08/2015   Procedure: TEM PARTIAL PROCTECTOMY OF RECTAL MASS;  Surgeon: Michael Boston, MD;  Location: [facility];  Service: General;  Laterality: N/A;  . TONSILLECTOMY AND ADENOIDECTOMY     as child    Social History   Social History  . Marital status: Divorced    Spouse name: N/A  . Number of children: 2  . Years of education: N/A   Occupational History  . 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 Smoking status: Former Smoker    Types: Cigarettes    Quit date: 08/13/1994  . Smokeless  tobacco: Never Used  . Alcohol use No  . Drug use: No  . Sexual activity: Not Currently   Other Topics Concern  . Not on file   Social History Narrative  . No narrative on file    Current Outpatient Prescriptions on File Prior to Visit  Medication Sig Dispense Refill  . aspirin 81 MG tablet Take 162 mg by mouth at bedtime.     . folic acid (FOLVITE) 1 MG tablet Take 1 mg by mouth every morning.     Marland Kitchen glucose blood (ONE TOUCH ULTRA TEST) test strip 1 each by Other route 2 (two) times daily. And lancets 2/day 200 each 3  . Insulin Glargine (LANTUS SOLOSTAR) 100 UNIT/ML Solostar Pen Inject 75 Units into the skin at bedtime. 25 pen 3  . Insulin Pen Needle 31G X 5 MM MISC 1 each by Does not apply route 3 (three) times daily as needed. 300 each 4  . losartan-hydrochlorothiazide (HYZAAR) 100-25 MG tablet TAKE 1 TABLET DAILY 90 tablet 1  . methotrexate (RHEUMATREX) 2.5 MG tablet Take 20 mg by mouth every Friday.    . rosuvastatin (CRESTOR) 20 MG tablet Take 1 tablet (20 mg total) by mouth at bedtime. 90 tablet 1   No current facility-administered medications on file prior to visit.     No Known Allergies  Family History  Problem Relation Age of  Onset  . Cancer Father        "all over"  . Coronary artery disease Brother   . Diabetes Brother   . Stroke Mother   . Diabetes Mother   . Colon cancer Neg Hx   . Esophageal cancer Neg Hx   . Rectal cancer Neg Hx   . Stomach cancer Neg Hx     There were no vitals taken for this visit.  Review of Systems He denies hypoglycemia    Objective:   Physical Exam VITAL SIGNS:  See vs page GENERAL: no distress Pulses: dorsalis pedis intact bilat.   MSK: no deformity of the feet CV: trace bilat leg edema Skin:  no ulcer on the feet.  normal color and temp on the feet. Neuro: sensation is intact to touch on the feet  Lab Results  Component Value Date   HGBA1C 9.7 03/20/2017      Assessment & Plan:  Insulin-requiring type 2 DM, with  renal insuff.  He needs increased rx.    Patient Instructions  check your blood sugar twice a day.  vary the time of day when you check, between before the 3 meals, and at bedtime.  also check if you have symptoms of your blood sugar being too high or too low.  please keep a record of the readings and bring it to your next appointment here (or you can bring the meter itself).  You can write it on any piece of paper.  please call us sooner if your blood sugar goes below 70, or if you have a lot of readings over 200. Please continue the same lantus.   Please increase the humalog to 75 units twice a day (just before meals).  Please call us next week, to tell us how the blood sugar is doing.  Please come back for a follow-up appointment in 1 month.

## 2017-03-30 ENCOUNTER — Telehealth: Payer: Self-pay | Admitting: Endocrinology

## 2017-03-30 NOTE — Telephone Encounter (Signed)
Patient called in with his blood sugar numbers:  03/23/17 AM- 304 PM-360 03/24/17 AM- 345 PM- 365 03/25/17 AM-349 PM-302 03/26/17 AM-302 PM- 343 03/27/17 AM- 250 PM-345 03/28/17 AM- 272 PM-343 03/29/17 AM- 223 PM- 302 03/30/17 AM-343

## 2017-03-30 NOTE — Telephone Encounter (Signed)
Patient notified of message via voicemail. Requested a call back to further discuss if needed.

## 2017-03-30 NOTE — Telephone Encounter (Signed)
I assume he is still taking his 75 units of Lantus. Will add a 30 units Lantus dose in am.

## 2017-03-30 NOTE — Telephone Encounter (Signed)
See message and please advise for Dr. Loanne Drilling while he is out of town. Thanks!

## 2017-04-10 ENCOUNTER — Telehealth: Payer: Self-pay

## 2017-04-10 ENCOUNTER — Telehealth: Payer: Self-pay | Admitting: Endocrinology

## 2017-04-10 NOTE — Telephone Encounter (Signed)
Called and notified patient medication changes. Patient understood and would call if no changes in sugars.

## 2017-04-10 NOTE — Telephone Encounter (Signed)
please call patient: I received a message that your blood sugar was still high.  Please increase the humalog to 85 units twice a day (just before meals).  I'll see you soon.

## 2017-04-20 ENCOUNTER — Encounter: Payer: Self-pay | Admitting: Endocrinology

## 2017-04-20 ENCOUNTER — Ambulatory Visit (INDEPENDENT_AMBULATORY_CARE_PROVIDER_SITE_OTHER): Payer: Medicare Other | Admitting: Endocrinology

## 2017-04-20 DIAGNOSIS — Z794 Long term (current) use of insulin: Secondary | ICD-10-CM

## 2017-04-20 DIAGNOSIS — E1129 Type 2 diabetes mellitus with other diabetic kidney complication: Secondary | ICD-10-CM

## 2017-04-20 DIAGNOSIS — E1165 Type 2 diabetes mellitus with hyperglycemia: Secondary | ICD-10-CM

## 2017-04-20 MED ORDER — INSULIN GLARGINE 100 UNIT/ML SOLOSTAR PEN: 90.0000 [IU] | PEN_INJECTOR | Freq: Every day | SUBCUTANEOUS | 3 refills | Status: DC

## 2017-04-20 MED ORDER — INSULIN LISPRO 100 UNIT/ML (KWIKPEN): PEN_INJECTOR | SUBCUTANEOUS | 3 refills | Status: DC

## 2017-04-20 NOTE — Patient Instructions (Addendum)
check your blood sugar twice a day.  vary the time of day when you check, between before the 3 meals, and at bedtime.  also check if you have symptoms of your blood sugar being too high or too low.  please keep a record of the readings and bring it to your next appointment here (or you can bring the meter itself).  You can write it on any piece of paper.  please call us sooner if your blood sugar goes below 70, or if you have a lot of readings over 200. Please increase the lantus to 90 units at bedtime, and:   continue the same humalog to 85 units twice a day (just before meals).  If you eat at lunchtime, take 10 units of humalog with that.  Please come back for a follow-up appointment in 2 months.

## 2017-04-20 NOTE — Progress Notes (Signed)
Subjective:    Patient ID: [employer], male    DOB: Oct 04, 1941, 76 y.o.   MRN: [id number]  HPI Pt returns for f/u of diabetes mellitus:  DM type: Insulin-requiring type 2 DM.   Dx'ed: 8250 Complications: polyneuropathy, retinopathy, and renal insufficiency.  Therapy: insulin since 2005 DKA: never Severe hypoglycemia: last episode was in 2014 Pancreatitis: never.  Pancreatic imaging: normal CT in 2006 Other: he eats 2 meals per day; he takes multiple daily injections.  Interval history: no cbg record, but states cbg's vary from 88-404.  It is in general higher as the day goes on, but not necessarily so.  It varies most widely in the evening than fasting.  The lowest fasting is 135.  pt states he feels well in general.  Past Medical History:  Diagnosis Date  . 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 Arthritis   . Diabetes mellitus   . Hyperlipidemia   . Hypertension   . Vitamin D deficiency     Past Surgical History:  Procedure Laterality Date  . ABCESS DRAINAGE Left    buttocks  . CARDIOVASCULAR STRESS TEST  10/12/1999   EF 63%. NO ISCHEMIA  . COLONOSCOPY W/ POLYPECTOMY     5 polyps  . EUS N/A 03/11/2015   Procedure: LOWER ENDOSCOPIC ULTRASOUND (EUS);  Surgeon: Milus Banister, MD;  Location: [facility];  Service: Endoscopy;  Laterality: N/A;  . FLEXIBLE SIGMOIDOSCOPY N/A 02/02/2015   Procedure: FLEXIBLE SIGMOIDOSCOPY;  Surgeon: Inda Castle, MD;  Location: [facility];  Service: Endoscopy;  Laterality: N/A;  ERBE  . PARTIAL PROCTECTOMY BY TEM N/A 04/08/2015   Procedure: TEM PARTIAL PROCTECTOMY OF RECTAL MASS;  Surgeon: Michael Boston, MD;  Location: [facility];  Service: General;  Laterality: N/A;  . TONSILLECTOMY AND ADENOIDECTOMY     as child    Social History   Social History  . Marital status: Divorced    Spouse name: N/A  . Number of children: 2  . Years of education: N/A   Occupational History  . retired  Chief Strategy Officer   Social History Main Topics  . Smoking status: Former Smoker    Types: Cigarettes    Quit date: 08/13/1994  . Smokeless tobacco: Never Used  . Alcohol use No  . Drug use: No  . Sexual activity: Not Currently   Other Topics Concern  . Not on file   Social History Narrative  . No narrative on file    Current Outpatient Prescriptions on File Prior to Visit  Medication Sig Dispense Refill  . aspirin 81 MG tablet Take 162 mg by mouth at bedtime.     . folic acid (FOLVITE) 1 MG tablet Take 1 mg by mouth every morning.     Marland Kitchen glucose blood (ONE TOUCH ULTRA TEST) test strip 1 each by Other route 2 (two) times daily. And lancets 2/day 200 each 3  . Insulin Pen Needle 31G X 5 MM MISC 1 each by Does not apply route 3 (three) times daily as needed. 300 each 4  . losartan-hydrochlorothiazide (HYZAAR) 100-25 MG tablet TAKE 1 TABLET DAILY 90 tablet 1  . methotrexate (RHEUMATREX) 2.5 MG tablet Take 20 mg by mouth every Friday.    . rosuvastatin (CRESTOR) 20 MG tablet Take 1 tablet (20 mg total) by mouth at bedtime. 90 tablet 1   No current facility-administered medications on file prior to visit.     No Known Allergies  Family  History  Problem Relation Age of Onset  . Cancer Father        "all over"  . Coronary artery disease Brother   . Diabetes Brother   . Stroke Mother   . Diabetes Mother   . Colon cancer Neg Hx   . Esophageal cancer Neg Hx   . Rectal cancer Neg Hx   . Stomach cancer Neg Hx     BP (!) 146/84   Pulse 65   Ht 5\' 7"  (1.702 m)   Wt 231 lb (104.8 kg)   SpO2 95%   BMI 36.18 kg/m    Review of Systems He denies hypoglycemia.     Objective:   Physical Exam VITAL SIGNS:  See vs page GENERAL: no distress Pulses: dorsalis pedis intact bilat.   MSK: no deformity of the feet CV: trace bilat leg edema Skin:  no ulcer on the feet.  normal color and temp on the feet. Neuro: sensation is intact to touch on the feet       Assessment & Plan:    Insulin-requiring type 2 DM, with renal insuff: he needs increased rx.   Patient Instructions  check your blood sugar twice a day.  vary the time of day when you check, between before the 3 meals, and at bedtime.  also check if you have symptoms of your blood sugar being too high or too low.  please keep a record of the readings and bring it to your next appointment here (or you can bring the meter itself).  You can write it on any piece of paper.  please call us sooner if your blood sugar goes below 70, or if you have a lot of readings over 200. Please increase the lantus to 90 units at bedtime, and:   continue the same humalog to 85 units twice a day (just before meals).  If you eat at lunchtime, take 10 units of humalog with that.  Please come back for a follow-up appointment in 2 months.

## 2017-05-05 ENCOUNTER — Other Ambulatory Visit: Payer: Self-pay | Admitting: Internal Medicine

## 2017-05-05 DIAGNOSIS — I10 Essential (primary) hypertension: Secondary | ICD-10-CM

## 2017-05-30 ENCOUNTER — Telehealth: Payer: Self-pay | Admitting: Endocrinology

## 2017-05-30 NOTE — Telephone Encounter (Signed)
Please reduce the lantus to 80 units at bedtime, and:   Increase the humalog to 110 units twice a day (just before meals).  If you eat at lunchtime, take 10 units of humalog with that. Please call or message [place] next week, to tell us how the blood sugar is doing

## 2017-05-30 NOTE — Telephone Encounter (Signed)
Left patient VM to call back so I could give him new insulin dosages.

## 2017-05-30 NOTE — Telephone Encounter (Signed)
Called patient and he stated that he mostly sees blood sugars in the 200's. He checks mornings and evenings. The lowest he has seen is 98 & the highest 272. This morning was 238 & yesterday evening was 224.

## 2017-05-30 NOTE — Telephone Encounter (Signed)
Patient returning missed call. Please call patient and advise. OK to leave message.

## 2017-05-30 NOTE — Telephone Encounter (Signed)
please call patient: I need to know more about how blood sugars are doing throughout the day.  Then we can adjust insulin if needed.

## 2017-05-31 NOTE — Telephone Encounter (Signed)
Spoke with patient and gave him new insulin dosages & he had no further questions. He stated he would call back with blood sugars next week.

## 2017-06-19 ENCOUNTER — Inpatient Hospital Stay (HOSPITAL_COMMUNITY)
Admission: EM | Admit: 2017-06-19 | Discharge: 2017-06-23 | DRG: 065 | Disposition: A | Discharge status: Home / Self Care | Payer: Medicare Other | Attending: Internal Medicine | Admitting: Internal Medicine

## 2017-06-19 ENCOUNTER — Encounter (HOSPITAL_COMMUNITY): Payer: Self-pay | Admitting: Emergency Medicine

## 2017-06-19 DIAGNOSIS — E119 Type 2 diabetes mellitus without complications: Secondary | ICD-10-CM | POA: Diagnosis present

## 2017-06-19 DIAGNOSIS — R200 Anesthesia of skin: Secondary | ICD-10-CM | POA: Diagnosis not present

## 2017-06-19 DIAGNOSIS — R030 Elevated blood-pressure reading, without diagnosis of hypertension: Secondary | ICD-10-CM | POA: Diagnosis not present

## 2017-06-19 DIAGNOSIS — R402142 Coma scale, eyes open, spontaneous, at arrival to emergency department: Secondary | ICD-10-CM | POA: Diagnosis present

## 2017-06-19 DIAGNOSIS — E785 Hyperlipidemia, unspecified: Secondary | ICD-10-CM | POA: Diagnosis not present

## 2017-06-19 DIAGNOSIS — Z7982 Long term (current) use of aspirin: Secondary | ICD-10-CM

## 2017-06-19 DIAGNOSIS — E559 Vitamin D deficiency, unspecified: Secondary | ICD-10-CM | POA: Diagnosis present

## 2017-06-19 DIAGNOSIS — R51 Headache: Secondary | ICD-10-CM | POA: Diagnosis not present

## 2017-06-19 DIAGNOSIS — Z823 Family history of stroke: Secondary | ICD-10-CM

## 2017-06-19 DIAGNOSIS — I10 Essential (primary) hypertension: Secondary | ICD-10-CM | POA: Diagnosis present

## 2017-06-19 DIAGNOSIS — I639 Cerebral infarction, unspecified: Secondary | ICD-10-CM | POA: Diagnosis present

## 2017-06-19 DIAGNOSIS — Z809 Family history of malignant neoplasm, unspecified: Secondary | ICD-10-CM

## 2017-06-19 DIAGNOSIS — G8191 Hemiplegia, unspecified affecting right dominant side: Secondary | ICD-10-CM | POA: Diagnosis present

## 2017-06-19 DIAGNOSIS — M1990 Unspecified osteoarthritis, unspecified site: Secondary | ICD-10-CM | POA: Diagnosis present

## 2017-06-19 DIAGNOSIS — R402252 Coma scale, best verbal response, oriented, at arrival to emergency department: Secondary | ICD-10-CM | POA: Diagnosis present

## 2017-06-19 DIAGNOSIS — Z794 Long term (current) use of insulin: Secondary | ICD-10-CM | POA: Diagnosis not present

## 2017-06-19 DIAGNOSIS — I63412 Cerebral infarction due to embolism of left middle cerebral artery: Secondary | ICD-10-CM | POA: Diagnosis not present

## 2017-06-19 DIAGNOSIS — R402362 Coma scale, best motor response, obeys commands, at arrival to emergency department: Secondary | ICD-10-CM | POA: Diagnosis present

## 2017-06-19 DIAGNOSIS — Z8249 Family history of ischemic heart disease and other diseases of the circulatory system: Secondary | ICD-10-CM

## 2017-06-19 DIAGNOSIS — Z87891 Personal history of nicotine dependence: Secondary | ICD-10-CM

## 2017-06-19 DIAGNOSIS — I6389 Other cerebral infarction: Secondary | ICD-10-CM

## 2017-06-19 DIAGNOSIS — Z833 Family history of diabetes mellitus: Secondary | ICD-10-CM

## 2017-06-19 DIAGNOSIS — R29704 NIHSS score 4: Secondary | ICD-10-CM | POA: Diagnosis present

## 2017-06-19 HISTORY — DX: Spondylosis without myelopathy or radiculopathy, cervical region: M47.812

## 2017-06-19 HISTORY — DX: Cerebral infarction, unspecified: I63.9

## 2017-06-19 LAB — COMPREHENSIVE METABOLIC PANEL
ALBUMIN: 2.9 g/dL — AB (ref 3.5–5.0)
ALK PHOS: 66 U/L (ref 38–126)
ALT: 27 U/L (ref 17–63)
AST: 36 U/L (ref 15–41)
Anion gap: 7 (ref 5–15)
BUN: 31 mg/dL — ABNORMAL HIGH (ref 6–20)
CALCIUM: 9 mg/dL (ref 8.9–10.3)
CHLORIDE: 111 mmol/L (ref 101–111)
CO2: 24 mmol/L (ref 22–32)
CREATININE: 1.22 mg/dL (ref 0.61–1.24)
GFR calc Af Amer: >60 mL/min (ref >60)
GFR calc non Af Amer: 57 mL/min — ABNORMAL LOW (ref >60)
GLUCOSE: 89 mg/dL (ref 65–99)
Potassium: 4.8 mmol/L (ref 3.5–5.1)
SODIUM: 142 mmol/L (ref 135–145)
Total Bilirubin: 0.6 mg/dL (ref 0.3–1.2)
Total Protein: 6.6 g/dL (ref 6.5–8.1)

## 2017-06-19 LAB — I-STAT CHEM 8, ED
BUN: 31 mg/dL — ABNORMAL HIGH (ref 6–20)
CALCIUM ION: 1.19 mmol/L (ref 1.15–1.40)
CHLORIDE: 111 mmol/L (ref 101–111)
Creatinine, Ser: 1.1 mg/dL (ref 0.61–1.24)
GLUCOSE: 85 mg/dL (ref 65–99)
HCT: 37 % — ABNORMAL LOW (ref 39.0–52.0)
Hemoglobin: 12.6 g/dL — ABNORMAL LOW (ref 13.0–17.0)
Potassium: 4.7 mmol/L (ref 3.5–5.1)
Sodium: 144 mmol/L (ref 135–145)
TCO2: 24 mmol/L (ref 22–32)

## 2017-06-19 LAB — CBC
HCT: 40 % (ref 39.0–52.0)
HEMOGLOBIN: 13.2 g/dL (ref 13.0–17.0)
MCH: 29.1 pg (ref 26.0–34.0)
MCHC: 33 g/dL (ref 30.0–36.0)
MCV: 88.3 fL (ref 78.0–100.0)
PLATELETS: 219 10*3/uL (ref 150–400)
RBC: 4.53 MIL/uL (ref 4.22–5.81)
RDW: 15 % (ref 11.5–15.5)
WBC: 8.1 10*3/uL (ref 4.0–10.5)

## 2017-06-19 LAB — DIFFERENTIAL
Basophils Absolute: 0 10*3/uL (ref 0.0–0.1)
Basophils Relative: 1 %
EOS PCT: 2 %
Eosinophils Absolute: 0.2 10*3/uL (ref 0.0–0.7)
LYMPHS ABS: 1.5 10*3/uL (ref 0.7–4.0)
LYMPHS PCT: 19 %
MONO ABS: 0.6 10*3/uL (ref 0.1–1.0)
Monocytes Relative: 7 %
Neutro Abs: 5.8 10*3/uL (ref 1.7–7.7)
Neutrophils Relative %: 71 %

## 2017-06-19 LAB — PROTIME-INR
INR: 1.02
PROTHROMBIN TIME: 13.3 s (ref 11.4–15.2)

## 2017-06-19 LAB — APTT: aPTT: 34 seconds (ref 24–36)

## 2017-06-19 LAB — I-STAT TROPONIN, ED: Troponin i, poc: 0.01 ng/mL (ref 0.00–0.08)

## 2017-06-19 NOTE — ED Triage Notes (Signed)
Per [facility], Pt from home. Pt had onset of R hand numbness at 1 pm today. Pt states he had difficulty grasping objects. Pt has weaker grip to R side, decreased sensation to R hand and forearm. Pt denies pain. Pt states he has no other neuro complaints. Pt denies recent injury to r hand. Pt intiual BP for EMS 220/140, repeat 190/90. Pt alert and oriented.

## 2017-06-20 ENCOUNTER — Inpatient Hospital Stay (HOSPITAL_COMMUNITY): Payer: Medicare Other

## 2017-06-20 ENCOUNTER — Emergency Department (HOSPITAL_COMMUNITY): Payer: Medicare Other

## 2017-06-20 ENCOUNTER — Encounter (HOSPITAL_COMMUNITY): Payer: Self-pay | Admitting: Internal Medicine

## 2017-06-20 DIAGNOSIS — I639 Cerebral infarction, unspecified: Secondary | ICD-10-CM | POA: Diagnosis present

## 2017-06-20 DIAGNOSIS — R200 Anesthesia of skin: Secondary | ICD-10-CM | POA: Diagnosis present

## 2017-06-20 DIAGNOSIS — Z809 Family history of malignant neoplasm, unspecified: Secondary | ICD-10-CM | POA: Diagnosis not present

## 2017-06-20 DIAGNOSIS — Z794 Long term (current) use of insulin: Secondary | ICD-10-CM | POA: Diagnosis not present

## 2017-06-20 DIAGNOSIS — E785 Hyperlipidemia, unspecified: Secondary | ICD-10-CM | POA: Diagnosis not present

## 2017-06-20 DIAGNOSIS — I6300 Cerebral infarction due to thrombosis of unspecified precerebral artery: Secondary | ICD-10-CM | POA: Diagnosis not present

## 2017-06-20 DIAGNOSIS — I10 Essential (primary) hypertension: Secondary | ICD-10-CM

## 2017-06-20 DIAGNOSIS — I63412 Cerebral infarction due to embolism of left middle cerebral artery: Secondary | ICD-10-CM

## 2017-06-20 DIAGNOSIS — Z87891 Personal history of nicotine dependence: Secondary | ICD-10-CM | POA: Diagnosis not present

## 2017-06-20 DIAGNOSIS — E119 Type 2 diabetes mellitus without complications: Secondary | ICD-10-CM

## 2017-06-20 DIAGNOSIS — Z833 Family history of diabetes mellitus: Secondary | ICD-10-CM | POA: Diagnosis not present

## 2017-06-20 DIAGNOSIS — R51 Headache: Secondary | ICD-10-CM | POA: Diagnosis not present

## 2017-06-20 DIAGNOSIS — R402142 Coma scale, eyes open, spontaneous, at arrival to emergency department: Secondary | ICD-10-CM | POA: Diagnosis present

## 2017-06-20 DIAGNOSIS — G8191 Hemiplegia, unspecified affecting right dominant side: Secondary | ICD-10-CM | POA: Diagnosis present

## 2017-06-20 DIAGNOSIS — I638 Other cerebral infarction: Secondary | ICD-10-CM | POA: Diagnosis not present

## 2017-06-20 DIAGNOSIS — Z7982 Long term (current) use of aspirin: Secondary | ICD-10-CM | POA: Diagnosis not present

## 2017-06-20 DIAGNOSIS — E559 Vitamin D deficiency, unspecified: Secondary | ICD-10-CM | POA: Diagnosis present

## 2017-06-20 DIAGNOSIS — M1990 Unspecified osteoarthritis, unspecified site: Secondary | ICD-10-CM | POA: Diagnosis present

## 2017-06-20 DIAGNOSIS — Z823 Family history of stroke: Secondary | ICD-10-CM | POA: Diagnosis not present

## 2017-06-20 DIAGNOSIS — R402252 Coma scale, best verbal response, oriented, at arrival to emergency department: Secondary | ICD-10-CM | POA: Diagnosis present

## 2017-06-20 DIAGNOSIS — R29704 NIHSS score 4: Secondary | ICD-10-CM | POA: Diagnosis present

## 2017-06-20 DIAGNOSIS — R402362 Coma scale, best motor response, obeys commands, at arrival to emergency department: Secondary | ICD-10-CM | POA: Diagnosis present

## 2017-06-20 DIAGNOSIS — Z8249 Family history of ischemic heart disease and other diseases of the circulatory system: Secondary | ICD-10-CM | POA: Diagnosis not present

## 2017-06-20 HISTORY — DX: Cerebral infarction, unspecified: I63.9

## 2017-06-20 LAB — GLUCOSE, CAPILLARY
GLUCOSE-CAPILLARY: 179 mg/dL — AB (ref 65–99)
Glucose-Capillary: 159 mg/dL — ABNORMAL HIGH (ref 65–99)
Glucose-Capillary: 160 mg/dL — ABNORMAL HIGH (ref 65–99)
Glucose-Capillary: 176 mg/dL — ABNORMAL HIGH (ref 65–99)

## 2017-06-20 LAB — RAPID URINE DRUG SCREEN, HOSP PERFORMED
Amphetamines: NOT DETECTED
BARBITURATES: NOT DETECTED
BENZODIAZEPINES: NOT DETECTED
Cocaine: NOT DETECTED
OPIATES: NOT DETECTED
TETRAHYDROCANNABINOL: NOT DETECTED

## 2017-06-20 LAB — URINALYSIS, ROUTINE W REFLEX MICROSCOPIC
BACTERIA UA: NONE SEEN
BILIRUBIN URINE: NEGATIVE
Glucose, UA: NEGATIVE mg/dL
KETONES UR: NEGATIVE mg/dL
Leukocytes, UA: NEGATIVE
Nitrite: NEGATIVE
PH: 5 (ref 5.0–8.0)
Protein, ur: >=300 mg/dL — AB
SPECIFIC GRAVITY, URINE: 1.014 (ref 1.005–1.030)
SQUAMOUS EPITHELIAL / LPF: NONE SEEN

## 2017-06-20 LAB — VAS US CAROTID
LCCADDIAS: -11 cm/s
LCCAPDIAS: 15 cm/s
LCCAPSYS: 72 cm/s
LEFT ECA DIAS: -11 cm/s
LEFT VERTEBRAL DIAS: 23 cm/s
LICADDIAS: 24 cm/s
LICADSYS: 89 cm/s
Left CCA dist sys: -66 cm/s
Left ICA prox dias: -8 cm/s
Left ICA prox sys: -62 cm/s
RCCADSYS: -70 cm/s
RCCAPSYS: 48 cm/s
RIGHT ECA DIAS: -12 cm/s
RIGHT VERTEBRAL DIAS: 12 cm/s
Right CCA prox dias: 9 cm/s

## 2017-06-20 LAB — ETHANOL

## 2017-06-20 LAB — HEMOGLOBIN A1C
HEMOGLOBIN A1C: 7.3 % — AB (ref 4.8–5.6)
Mean Plasma Glucose: 162.81 mg/dL

## 2017-06-20 MED ORDER — LORAZEPAM 2 MG/ML IJ SOLN
1.0000 mg | Freq: Once | INTRAMUSCULAR | Status: AC
  Administered 2017-06-20: 1 mg via INTRAVENOUS
  Filled 2017-06-20: qty 1

## 2017-06-20 MED ORDER — ENOXAPARIN SODIUM 40 MG/0.4ML ~~LOC~~ SOLN
40.0000 mg | SUBCUTANEOUS | Status: DC
  Administered 2017-06-20 – 2017-06-23 (×4): 40 mg via SUBCUTANEOUS
  Filled 2017-06-20 (×4): qty 0.4

## 2017-06-20 MED ORDER — ACETAMINOPHEN 650 MG RE SUPP: 650.0000 mg | RECTAL | Status: DC | PRN

## 2017-06-20 MED ORDER — INSULIN ASPART 100 UNIT/ML ~~LOC~~ SOLN
0.0000 [IU] | Freq: Three times a day (TID) | SUBCUTANEOUS | Status: DC
  Administered 2017-06-20 – 2017-06-22 (×7): 2 [IU] via SUBCUTANEOUS
  Administered 2017-06-22: 1 [IU] via SUBCUTANEOUS
  Administered 2017-06-22: 2 [IU] via SUBCUTANEOUS
  Administered 2017-06-23: 3 [IU] via SUBCUTANEOUS
  Administered 2017-06-23: 2 [IU] via SUBCUTANEOUS

## 2017-06-20 MED ORDER — FOLIC ACID 1 MG PO TABS
1.0000 mg | ORAL_TABLET | Freq: Every morning | ORAL | Status: DC
  Administered 2017-06-20 – 2017-06-23 (×4): 1 mg via ORAL
  Filled 2017-06-20 (×4): qty 1

## 2017-06-20 MED ORDER — ACETAMINOPHEN 160 MG/5ML PO SOLN: 650.0000 mg | ORAL | Status: DC | PRN

## 2017-06-20 MED ORDER — STROKE: EARLY STAGES OF RECOVERY BOOK
Freq: Once | Status: AC
  Administered 2017-06-20: 13:00:00
  Filled 2017-06-20: qty 1

## 2017-06-20 MED ORDER — SENNOSIDES-DOCUSATE SODIUM 8.6-50 MG PO TABS: 1.0000 | ORAL_TABLET | Freq: Every evening | ORAL | Status: DC | PRN

## 2017-06-20 MED ORDER — INSULIN GLARGINE 100 UNIT/ML SOLOSTAR PEN: 60.0000 [IU] | PEN_INJECTOR | Freq: Every day | SUBCUTANEOUS | Status: DC

## 2017-06-20 MED ORDER — ROSUVASTATIN CALCIUM 20 MG PO TABS
20.0000 mg | ORAL_TABLET | Freq: Every day | ORAL | Status: DC
  Administered 2017-06-20 – 2017-06-22 (×3): 20 mg via ORAL
  Filled 2017-06-20 (×3): qty 1

## 2017-06-20 MED ORDER — ASPIRIN 325 MG PO TABS
325.0000 mg | ORAL_TABLET | Freq: Every day | ORAL | Status: DC
  Administered 2017-06-20 – 2017-06-22 (×3): 325 mg via ORAL
  Filled 2017-06-20 (×3): qty 1

## 2017-06-20 MED ORDER — ACETAMINOPHEN 325 MG PO TABS: 650.0000 mg | ORAL_TABLET | ORAL | Status: DC | PRN

## 2017-06-20 MED ORDER — SODIUM CHLORIDE 0.9 % IV SOLN
INTRAVENOUS | Status: DC
  Administered 2017-06-20 – 2017-06-21 (×3): via INTRAVENOUS

## 2017-06-20 MED ORDER — GADOBENATE DIMEGLUMINE 529 MG/ML IV SOLN
20.0000 mL | Freq: Once | INTRAVENOUS | Status: AC | PRN
  Administered 2017-06-20: 20 mL via INTRAVENOUS

## 2017-06-20 MED ORDER — SODIUM CHLORIDE 0.9 % IV SOLN: INTRAVENOUS | Status: DC

## 2017-06-20 MED ORDER — INSULIN GLARGINE 100 UNIT/ML SOLOSTAR PEN: 80.0000 [IU] | PEN_INJECTOR | Freq: Every day | SUBCUTANEOUS | Status: DC

## 2017-06-20 MED ORDER — INSULIN ASPART 100 UNIT/ML ~~LOC~~ SOLN
0.0000 [IU] | Freq: Every day | SUBCUTANEOUS | Status: DC
  Administered 2017-06-21: 2 [IU] via SUBCUTANEOUS

## 2017-06-20 MED ORDER — INSULIN GLARGINE 100 UNIT/ML ~~LOC~~ SOLN
60.0000 [IU] | Freq: Every day | SUBCUTANEOUS | Status: DC
  Filled 2017-06-20: qty 0.6

## 2017-06-20 NOTE — Progress Notes (Signed)
SLP Cancellation Note  Patient Details Name: Jake Gross MRN: [id number] DOB: [birth year]   Cancelled treatment:       Reason Eval/Treat Not Completed: Patient at procedure or test/unavailable  Attempted to see pt for cognitive-linguistic evaluation, pt off unit for procedure.  Will follow up as pt is available    Emilio Math 06/20/2017, 12:16 PM

## 2017-06-20 NOTE — H&P (Signed)
History and Physical    Jake Gross TIR:[id number] DOB: 14-Sep-1941 DOA: 06/19/2017  PCP: Jearld Fenton, NP Patient coming from: home  Chief Complaint: right hand weakness  HPI: Jake Gross is a very pleasant 76 y.o. male with medical history significant for diabetes, hypertension, hyperlipidemia, arthritis presents to the emergency department with chief complaint right hand weakness/numbness tingling. Initial evaluation includes an MRI revealing stroke  Information is obtained from the patient. He states he was in his usual state of health to yesterday he suffered a fall. He reports he did not note any right leg weakness but is unable to explain his fall. He denies hitting his head or losing consciousness. He denies having trouble getting up or any other injury. Shortly thereafter he developed numbness tingling in his right hand. He also noted that he was "unable to pick up a fork". He denies headache visual disturbances chest pain palpitation shortness of breath. He denies any difficulty speaking chewing or swallowing. His report his insulin regimen was changed recently from Lantus 90 units twice a day to Lantus 80 units daily at bedtime and Humalog 110 units in the a.m. and in the afternoon. He denies abdominal pain nausea vomiting diarrhea constipation melena bright red blood per rectum. He denies dysuria hematuria frequency or urgency.    ED Course: In the emergency department is afebrile hemodynamically stable and not hypoxic. Passed a bedside swallow eval in the emergency department.  Review of Systems: As per HPI otherwise all other systems reviewed and are negative.   Ambulatory Status: Ambulates independently, is independent with ADLs  Past Medical History:  Diagnosis Date  . 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 Arthritis   . Diabetes mellitus   . Hyperlipidemia   . Hypertension   . Vitamin D deficiency     Past  Surgical History:  Procedure Laterality Date  . ABCESS DRAINAGE Left    buttocks  . CARDIOVASCULAR STRESS TEST  10/12/1999   EF 63%. NO ISCHEMIA  . COLONOSCOPY W/ POLYPECTOMY     5 polyps  . EUS N/A 03/11/2015   Procedure: LOWER ENDOSCOPIC ULTRASOUND (EUS);  Surgeon: Milus Banister, MD;  Location: [facility];  Service: Endoscopy;  Laterality: N/A;  . FLEXIBLE SIGMOIDOSCOPY N/A 02/02/2015   Procedure: FLEXIBLE SIGMOIDOSCOPY;  Surgeon: Inda Castle, MD;  Location: [facility];  Service: Endoscopy;  Laterality: N/A;  ERBE  . PARTIAL PROCTECTOMY BY TEM N/A 04/08/2015   Procedure: TEM PARTIAL PROCTECTOMY OF RECTAL MASS;  Surgeon: Michael Boston, MD;  Location: [facility];  Service: General;  Laterality: N/A;  . TONSILLECTOMY AND ADENOIDECTOMY     as child    Social History   Social History  . Marital status: Divorced    Spouse name: N/A  . Number of children: 2  . Years of education: N/A   Occupational History  . 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 Smoking status: Former Smoker    Types: Cigarettes    Quit date: 08/13/1994  . Smokeless tobacco: Never Used  . Alcohol use No  . Drug use: No  . Sexual activity: Not Currently   Other Topics Concern  . Not on file   Social History Narrative  . No narrative on file    No Known Allergies  Family History  Problem Relation Age of Onset  . Cancer Father        "all over"  . Coronary artery disease  Brother   . Diabetes Brother   . Stroke Mother   . Diabetes Mother   . Colon cancer Neg Hx   . Esophageal cancer Neg Hx   . Rectal cancer Neg Hx   . Stomach cancer Neg Hx     Prior to Admission medications   Medication Sig Start Date End Date Taking? Authorizing Provider  aspirin 81 MG tablet Take 162 mg by mouth at bedtime.    Yes [provider]  folic acid (FOLVITE) 1 MG tablet Take 1 mg by mouth every morning.    Yes [provider]  Insulin Glargine (LANTUS SOLOSTAR) 100 UNIT/ML Solostar Pen  Inject 90 Units into the skin at bedtime. Patient taking differently: Inject 80 Units into the skin at bedtime.  04/20/17  Yes Renato Shin, MD  insulin lispro (HUMALOG KWIKPEN) 100 UNIT/ML KiwkPen 3 times a day (just before each meal) 85-10-85 units, and pen needles 3/day. Patient taking differently: Inject 110 Units into the skin 2 (two) times daily.  04/20/17  Yes Renato Shin, MD  losartan-hydrochlorothiazide [facility]) 100-25 MG tablet TAKE 1 TABLET DAILY 05/07/17  Yes Jearld Fenton, NP  methotrexate (RHEUMATREX) 2.5 MG tablet Take 20 mg by mouth every Friday. 12/04/14  Yes [provider]  rosuvastatin (CRESTOR) 20 MG tablet Take 1 tablet (20 mg total) by mouth at bedtime. 02/05/17  Yes Baity, Coralie Keens, NP  glucose blood (ONE TOUCH ULTRA TEST) test strip 1 each by Other route 2 (two) times daily. And lancets 2/day 02/27/17   Renato Shin, MD  Insulin Pen Needle 31G X 5 MM MISC 1 each by Does not apply route 3 (three) times daily as needed. 01/07/16   Jearld Fenton, NP    Physical Exam: Vitals:   06/20/17 0530 06/20/17 0600 06/20/17 0639 06/20/17 0644  BP: (!) 162/69 (!) 161/59  (!) 160/68  Pulse: 60 (!) 53  (!) 59  Resp: 16 15  18   Temp:    98.1 F (36.7 C)  TempSrc:    Oral  SpO2: 95% 97%  98%  Weight:   104.6 kg (230 lb 8 oz)   Height:   5\' 7"  (1.702 m)      General:  Appears calm and comfortable Eyes:  PERRL, EOMI, normal lids, iris ENT:  grossly normal hearing, lips & tongue, His membranes of his mouth are pink and very dry Neck:  no LAD, masses or thyromegaly Cardiovascular:  RRR, no m/r/g. No LE edema.  Respiratory:  CTA bilaterally, no w/r/r. Normal respiratory effort. Abdomen:  soft, ntnd, positive bowel sounds no guarding or rebounding Skin:  no rash or induration seen on limited exam Musculoskeletal:  grossly normal tone BUE/BLE, good ROM, no bony abnormality Psychiatric:  grossly normal mood and affect, speech fluent and appropriate, AOx3 Neurologic:  Alert  and oriented 3 speech clear facial symmetry tongue midline right grip 4 out of 5 left grip 5 out of 5 lower extremity strength 5 out of 5 bilaterally  Labs on Admission: I have personally reviewed following labs and imaging studies  CBC:  Recent Labs Lab 06/19/17 2305 06/19/17 2322  WBC 8.1  --   NEUTROABS 5.8  --   HGB 13.2 12.6*  HCT 40.0 37.0*  MCV 88.3  --   PLT 219  --    Basic Metabolic Panel:  Recent Labs Lab 06/19/17 2305 06/19/17 2322  NA 142 144  K 4.8 4.7  CL 111 111  CO2 24  --  GLUCOSE 89 85  BUN 31* 31*  CREATININE 1.22 1.10  CALCIUM 9.0  --    GFR: Estimated Creatinine Clearance: 67.9 mL/min (by C-G formula based on SCr of 1.1 mg/dL). Liver Function Tests:  Recent Labs Lab 06/19/17 2305  AST 36  ALT 27  ALKPHOS 66  BILITOT 0.6  PROT 6.6  ALBUMIN 2.9*   No results for input(s): LIPASE, AMYLASE in the last 168 hours. No results for input(s): AMMONIA in the last 168 hours. Coagulation Profile:  Recent Labs Lab 06/19/17 2305  INR 1.02   Cardiac Enzymes: No results for input(s): CKTOTAL, CKMB, CKMBINDEX, TROPONINI in the last 168 hours. BNP (last 3 results) No results for input(s): PROBNP in the last 8760 hours. HbA1C: No results for input(s): HGBA1C in the last 72 hours. CBG: No results for input(s): GLUCAP in the last 168 hours. Lipid Profile: No results for input(s): CHOL, HDL, LDLCALC, TRIG, CHOLHDL, LDLDIRECT in the last 72 hours. Thyroid Function Tests: No results for input(s): TSH, T4TOTAL, FREET4, T3FREE, THYROIDAB in the last 72 hours. Anemia Panel: No results for input(s): VITAMINB12, FOLATE, FERRITIN, TIBC, IRON, RETICCTPCT in the last 72 hours. Urine analysis:    Component Value Date/Time   COLORURINE YELLOW 06/20/2017 0138   APPEARANCEUR CLEAR 06/20/2017 0138   LABSPEC 1.014 06/20/2017 0138   PHURINE 5.0 06/20/2017 0138   GLUCOSEU NEGATIVE 06/20/2017 0138   HGBUR SMALL (A) 06/20/2017 0138   BILIRUBINUR NEGATIVE  06/20/2017 0138   KETONESUR NEGATIVE 06/20/2017 0138   PROTEINUR >=300 (A) 06/20/2017 0138   UROBILINOGEN 0.2 04/11/2015 1658   NITRITE NEGATIVE 06/20/2017 0138   LEUKOCYTESUR NEGATIVE 06/20/2017 0138    Creatinine Clearance: Estimated Creatinine Clearance: 67.9 mL/min (by C-G formula based on SCr of 1.1 mg/dL).  Sepsis Labs: @LABRCNTIP (procalcitonin:4,lacticidven:4) )No results found for this or any previous visit (from the past 240 hour(s)).   Radiological Exams on Admission: Ct Head Wo Contrast  Result Date: 06/20/2017 CLINICAL DATA:  76 y/o M; loss of feeling to the right arm, stroke suspected. EXAM: CT HEAD WITHOUT CONTRAST TECHNIQUE: Contiguous axial images were obtained from the base of the skull through the vertex without intravenous contrast. COMPARISON:  None. FINDINGS: Brain: No evidence of acute infarction, hemorrhage, hydrocephalus, extra-axial collection or mass lesion/mass effect. Mild brain parenchymal volume loss and chronic microvascular ischemic changes of the brain. Vascular: Calcific atherosclerosis of carotid siphons. No hyperdense vessel. Skull: Negative. Sinuses/Orbits: Tiny left sphenoid sinus mucous retention cyst. Otherwise normal aeration of paranasal sinuses and mastoid air cells. Orbits are unremarkable. Other: Negative peer IMPRESSION: 1. No acute intracranial abnormality identified. 2. Mild chronic microvascular ischemic changes and mild parenchymal volume loss of the brain. Electronically Signed   By: Kristine Garbe M.D.   On: 06/20/2017 01:36   Mr Jeri Cos And Wo Contrast  Result Date: 06/20/2017 CLINICAL DATA:  76 y/o M; sudden onset of right forearm and hand numbness. EXAM: MRI HEAD WITHOUT AND WITH CONTRAST MRI CERVICAL SPINE WITHOUT AND WITH CONTRAST TECHNIQUE: Multiplanar, multiecho pulse sequences of the brain and surrounding structures, and cervical spine, to include the craniocervical junction and cervicothoracic junction, were obtained without  and with intravenous contrast. CONTRAST:  38mL MULTIHANCE GADOBENATE DIMEGLUMINE 529 MG/ML IV SOLN COMPARISON:  06/20/2017 CT head. FINDINGS: MRI HEAD FINDINGS Brain: Subcentimeter focus of reduced diffusion within the left posterior insula cortex (series 3: Image 29). Additional subcentimeter focus within the left superior parietal cortex extending in the subcortical white matter (3:37). Mild chronic microvascular ischemic changes of the  brain and mild brain parenchymal volume loss. Right frontal subcortical white matter punctate focus of susceptibility hypointensity compatible with hemosiderin deposition of chronic microhemorrhage. No focal mass effect, hydrocephalus, or extra-axial collection. No abnormal enhancement. Vascular: Normal flow voids. Skull and upper cervical spine: Normal marrow signal. Sinuses/Orbits: Negative. Other: None. MRI CERVICAL SPINE FINDINGS Extensive motion artifact. Alignment: Straightening of cervical lordosis.  No listhesis. Vertebrae: No fracture, evidence of discitis, or bone lesion. No abnormal enhancement. Cord: Normal signal and morphology.  No abnormal enhancement. Posterior Fossa, vertebral arteries, paraspinal tissues: Negative. Disc levels: C2-3: Left-sided disc osteophyte complex and facet hypertrophy with moderate left foraminal stenosis. No significant canal stenosis. C3-4: Disc osteophyte complex with bilateral uncovertebral and facet hypertrophy greater on the left. Mild right and moderate to severe left foraminal stenosis. Mild canal stenosis. C4-5: Disc osteophyte complex with bilateral uncovertebral and facet hypertrophy. Moderate to severe bilateral foraminal stenosis. Mild canal stenosis. C5-6: Disc osteophyte complex with bilateral uncovertebral and facet hypertrophy. Severe bilateral foraminal stenosis and moderate canal stenosis. C6-7: Disc osteophyte complex with bilateral uncovertebral and facet hypertrophy. Moderate bilateral foraminal stenosis and mild canal  stenosis. C7-T1: Left-sided disc osteophyte complex and facet hypertrophy with mild left foraminal stenosis. No significant canal stenosis. IMPRESSION: MRI brain: 1. Subcentimeter foci of acute/early subacute infarct within left posterior insula and the left superior parietal cortex extending in the subcortical white matter. No acute hemorrhage. 2. Mild for age chronic microvascular ischemic changes and mild parenchymal volume loss of the brain. No abnormal enhancement of the brain. MRI cervical: 1. 2. No acute osseous abnormality or abnormal bone marrow signal. No abnormal cord signal or enhancement. 3. 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 These results were called by telephone at the time of interpretation on 06/20/2017 at 5:15 am to Dr. Vanita Panda, who verbally acknowledged these results. Electronically Signed   By: Kristine Garbe M.D.   On: 06/20/2017 05:22   Mr Cervical Spine W Or Wo Contrast  Result Date: 06/20/2017 CLINICAL DATA:  76 y/o M; sudden onset of right forearm and hand numbness. EXAM: MRI HEAD WITHOUT AND WITH CONTRAST MRI CERVICAL SPINE WITHOUT AND WITH CONTRAST TECHNIQUE: Multiplanar, multiecho pulse sequences of the brain and surrounding structures, and cervical spine, to include the craniocervical junction and cervicothoracic junction, were obtained without and with intravenous contrast. CONTRAST:  58mL MULTIHANCE GADOBENATE DIMEGLUMINE 529 MG/ML IV SOLN COMPARISON:  06/20/2017 CT head. FINDINGS: MRI HEAD FINDINGS Brain: Subcentimeter focus of reduced diffusion within the left posterior insula cortex (series 3: Image 29). Additional subcentimeter focus within the left superior parietal cortex extending in the subcortical white matter (3:37). Mild chronic microvascular ischemic changes of the brain and mild brain parenchymal volume loss. Right frontal subcortical white matter punctate focus of  susceptibility hypointensity compatible with hemosiderin deposition of chronic microhemorrhage. No focal mass effect, hydrocephalus, or extra-axial collection. No abnormal enhancement. Vascular: Normal flow voids. Skull and upper cervical spine: Normal marrow signal. Sinuses/Orbits: Negative. Other: None. MRI CERVICAL SPINE FINDINGS Extensive motion artifact. Alignment: Straightening of cervical lordosis.  No listhesis. Vertebrae: No fracture, evidence of discitis, or bone lesion. No abnormal enhancement. Cord: Normal signal and morphology.  No abnormal enhancement. Posterior Fossa, vertebral arteries, paraspinal tissues: Negative. Disc levels: C2-3: Left-sided disc osteophyte complex and facet hypertrophy with moderate left foraminal stenosis. No significant canal stenosis. C3-4: Disc osteophyte complex with bilateral uncovertebral and facet hypertrophy greater on the left. Mild right and moderate to  severe left foraminal stenosis. Mild canal stenosis. C4-5: Disc osteophyte complex with bilateral uncovertebral and facet hypertrophy. Moderate to severe bilateral foraminal stenosis. Mild canal stenosis. C5-6: Disc osteophyte complex with bilateral uncovertebral and facet hypertrophy. Severe bilateral foraminal stenosis and moderate canal stenosis. C6-7: Disc osteophyte complex with bilateral uncovertebral and facet hypertrophy. Moderate bilateral foraminal stenosis and mild canal stenosis. C7-T1: Left-sided disc osteophyte complex and facet hypertrophy with mild left foraminal stenosis. No significant canal stenosis. IMPRESSION: MRI brain: 1. Subcentimeter foci of acute/early subacute infarct within left posterior insula and the left superior parietal cortex extending in the subcortical white matter. No acute hemorrhage. 2. Mild for age chronic microvascular ischemic changes and mild parenchymal volume loss of the brain. No abnormal enhancement of the brain. MRI cervical: 1. 2. No acute osseous abnormality or  abnormal bone marrow signal. No abnormal cord signal or enhancement. 3. 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 These results were called by telephone at the time of interpretation on 06/20/2017 at 5:15 am to Dr. Vanita Panda, who verbally acknowledged these results. Electronically Signed   By: Kristine Garbe M.D.   On: 06/20/2017 05:22    EKG: Independently reviewed. Sinus rhythm  Assessment/Plan Principal Problem:   Stroke [facility]) Active Problems:   Diabetes mellitus type 2, insulin dependent ([facility])   Hypertension   HLD (hyperlipidemia)   #1. Stroke. MRI Subcentimeter foci of acute/early subacute infarct within left posterior insula and the left superior parietal cortex extending in the subcortical white matter. No acute hemorrhage. EKG with sinus rhythm. Distal troponin negative. Patient has passed bedside swallow eval. He reports he feels like his right hand is less tingly -Admit to telemetry -MRA -Chest x-ray, carotid Dopplers, 2-D echo -Lipid panel, hemoglobin A1c -Aspirin, continue Crestor -Physical therapy, occupational therapy -Neuro checks -Appreciate neuro input  #2. Diabetes. Serum glucose 85 on admission. Home medications include Lantus and Humalog -We'll continue Lantus at lower dose -Hold Humalog home dose now -Sliding scale insulin for optimal control -Obtain a hemoglobin A1c -Diabetes coordinator consult  #3. Hypertension. Her pressure high end of normal while in the emergency department. Home medications include Cozaar -We'll hold Cozaar for now -Monitor blood pressure -Rocephin as indicated    DVT prophylaxis: lovenox  Code Status: full  Family Communication: none present  Disposition Plan: home  Consults called: neuro  Admission status: inpatient    Radene Gunning MD [facility]  If 7PM-7AM, please contact night-coverage [email or web address] Password  [facility]  06/20/2017, 6:49 AM

## 2017-06-20 NOTE — ED Provider Notes (Signed)
[facility] DEPT Provider Note   CSN: [id number] Arrival date & time: 06/19/17  2156     History   Chief Complaint Chief Complaint  Patient presents with  . Numbness    to R hand    HPI Jake Gross is a 76 y.o. male.  Patient presents to ED with sudden onset of right forearm and hand numbness with difficulty grasping objects. No headache, visual disturbance, difficulty speaking, or lower extremity weakness.   The history is provided by the patient.  Neurologic Problem  This is a new problem. The current episode started 6 to 12 hours ago. The problem has not changed since onset.Pertinent negatives include no chest pain, no headaches and no shortness of breath.    Past Medical History:  Diagnosis Date  . 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 Arthritis   . Diabetes mellitus   . Hyperlipidemia   . Hypertension   . Vitamin D deficiency     Patient Active Problem List   Diagnosis Date Noted  . HLD (hyperlipidemia) 09/14/2015  . Hypertension   . 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 Arthritis 09/16/2012  . Diabetes mellitus type 2, insulin dependent ([place]) 08/18/2011    Past Surgical History:  Procedure Laterality Date  . ABCESS DRAINAGE Left    buttocks  . CARDIOVASCULAR STRESS TEST  10/12/1999   EF 63%. NO ISCHEMIA  . COLONOSCOPY W/ POLYPECTOMY     5 polyps  . EUS N/A 03/11/2015   Procedure: LOWER ENDOSCOPIC ULTRASOUND (EUS);  Surgeon: Milus Banister, MD;  Location: [facility];  Service: Endoscopy;  Laterality: N/A;  . FLEXIBLE SIGMOIDOSCOPY N/A 02/02/2015   Procedure: FLEXIBLE SIGMOIDOSCOPY;  Surgeon: Inda Castle, MD;  Location: [facility];  Service: Endoscopy;  Laterality: N/A;  ERBE  . PARTIAL PROCTECTOMY BY TEM N/A 04/08/2015   Procedure: TEM PARTIAL PROCTECTOMY OF RECTAL MASS;  Surgeon: Michael Boston, MD;  Location: [facility];  Service: General;  Laterality: N/A;   . TONSILLECTOMY AND ADENOIDECTOMY     as child       Home Medications    Prior to Admission medications   Medication Sig Start Date End Date Taking? Authorizing Provider  aspirin 81 MG tablet Take 162 mg by mouth at bedtime.    Yes [provider]  folic acid (FOLVITE) 1 MG tablet Take 1 mg by mouth every morning.    Yes [provider]  Insulin Glargine (LANTUS SOLOSTAR) 100 UNIT/ML Solostar Pen Inject 90 Units into the skin at bedtime. Patient taking differently: Inject 80 Units into the skin at bedtime.  04/20/17  Yes Renato Shin, MD  insulin lispro (HUMALOG KWIKPEN) 100 UNIT/ML KiwkPen 3 times a day (just before each meal) 85-10-85 units, and pen needles 3/day. Patient taking differently: Inject 110 Units into the skin 2 (two) times daily.  04/20/17  Yes Renato Shin, MD  losartan-hydrochlorothiazide [facility]) 100-25 MG tablet TAKE 1 TABLET DAILY 05/07/17  Yes Jearld Fenton, NP  methotrexate (RHEUMATREX) 2.5 MG tablet Take 20 mg by mouth every Friday. 12/04/14  Yes [provider]  rosuvastatin (CRESTOR) 20 MG tablet Take 1 tablet (20 mg total) by mouth at bedtime. 02/05/17  Yes Baity, Coralie Keens, NP  glucose blood (ONE TOUCH ULTRA TEST) test strip 1 each by Other route 2 (two) times daily. And lancets 2/day 02/27/17   Renato Shin, MD  Insulin Pen Needle 31G X 5 MM  MISC 1 each by Does not apply route 3 (three) times daily as needed. 01/07/16   Jearld Fenton, NP    Family History Family History  Problem Relation Age of Onset  . Cancer Father        "all over"  . Coronary artery disease Brother   . Diabetes Brother   . Stroke Mother   . Diabetes Mother   . Colon cancer Neg Hx   . Esophageal cancer Neg Hx   . Rectal cancer Neg Hx   . Stomach cancer Neg Hx     Social History Social History  Substance Use Topics  . Smoking status: Former Smoker    Types: Cigarettes    Quit date: 08/13/1994  . Smokeless tobacco: Never Used  . Alcohol use No      Allergies   Patient has no known allergies.   Review of Systems Review of Systems  Respiratory: Negative for shortness of breath.   Cardiovascular: Negative for chest pain.  Neurological: Positive for weakness and numbness. Negative for headaches.  All other systems reviewed and are negative.    Physical Exam Updated Vital Signs BP (!) 168/82   Pulse (!) 55   Temp 98.8 F (37.1 C) (Oral)   Resp 14   Ht 5\' 7"  (1.702 m)   Wt 95.3 kg (210 lb)   SpO2 97%   BMI 32.89 kg/m   Physical Exam  Constitutional: He is oriented to person, place, and time. He appears well-developed and well-nourished.  HENT:  Head: Atraumatic.  Eyes: Pupils are equal, round, and reactive to light. Conjunctivae and EOM are normal.  Neck: Neck supple.  Cardiovascular: Normal rate and regular rhythm.   Pulmonary/Chest: Effort normal and breath sounds normal.  Abdominal: Soft.  Musculoskeletal: Normal range of motion. He exhibits no edema.  Neurological: He is alert and oriented to person, place, and time. A sensory deficit is present. Coordination abnormal.  Skin: Skin is warm and dry.  Psychiatric: He has a normal mood and affect.  Nursing note and vitals reviewed.    ED Treatments / Results  Labs (all labs ordered are listed, but only abnormal results are displayed) Labs Reviewed  COMPREHENSIVE METABOLIC PANEL - Abnormal; Notable for the following:       Result Value   BUN 31 (*)    Albumin 2.9 (*)    GFR calc non Af Amer 57 (*)    All other components within normal limits  I-STAT CHEM 8, ED - Abnormal; Notable for the following:    BUN 31 (*)    Hemoglobin 12.6 (*)    HCT 37.0 (*)    All other components within normal limits  PROTIME-INR  APTT  CBC  DIFFERENTIAL  ETHANOL  RAPID URINE DRUG SCREEN, HOSP PERFORMED  URINALYSIS, ROUTINE W REFLEX MICROSCOPIC  I-STAT TROPONIN, ED    EKG  EKG Interpretation  Date/Time:  Tuesday June 19 2017 22:49:40 EDT Ventricular Rate:   59 PR Interval:    QRS Duration: 88 QT Interval:  425 QTC Calculation: 421 R Axis:     Text Interpretation:  Sinus rhythm Confirmed by Davonna Belling [phone]) on 06/20/2017 1:15:04 AM       Radiology Ct Head Wo Contrast  Result Date: 06/20/2017 CLINICAL DATA:  76 y/o M; loss of feeling to the right arm, stroke suspected. EXAM: CT HEAD WITHOUT CONTRAST TECHNIQUE: Contiguous axial images were obtained from the base of the skull through the vertex without intravenous contrast. COMPARISON:  None.  FINDINGS: Brain: No evidence of acute infarction, hemorrhage, hydrocephalus, extra-axial collection or mass lesion/mass effect. Mild brain parenchymal volume loss and chronic microvascular ischemic changes of the brain. Vascular: Calcific atherosclerosis of carotid siphons. No hyperdense vessel. Skull: Negative. Sinuses/Orbits: Tiny left sphenoid sinus mucous retention cyst. Otherwise normal aeration of paranasal sinuses and mastoid air cells. Orbits are unremarkable. Other: Negative peer IMPRESSION: 1. No acute intracranial abnormality identified. 2. Mild chronic microvascular ischemic changes and mild parenchymal volume loss of the brain. Electronically Signed   By: Kristine Garbe M.D.   On: 06/20/2017 01:36    Procedures Procedures (including critical care time)  Medications Ordered in ED Medications - No data to display   Initial Impression / Assessment and Plan / ED Course  I have reviewed the triage vital signs and the nursing notes.  Pertinent labs & imaging results that were available during my care of the patient were reviewed by me and considered in my medical decision making (see chart for details).     Spoke with neurohospitalist (K[facility])--recommends MRI brain and neck.  Patient signed out to Dr. Vanita Panda pending completion of MRI.     Final Clinical Impressions(s) / ED Diagnoses   Final diagnoses:  None    New Prescriptions New Prescriptions   No medications  on file     Etta Quill, NP 06/20/17 [place]    Davonna Belling, MD 06/25/17 [phone]

## 2017-06-20 NOTE — Progress Notes (Signed)
Received report from [facility] in the ED.

## 2017-06-20 NOTE — Progress Notes (Signed)
Pt arrived to 5W02 from ED. Patient vital signs stable and no complaints of pain at this time. Pt is alert and oriented X4 and is wanting to sleep. Skin assessment completed by this RN and Rosezella Rumpf., RN. Telemetry box 32 applied and second verified. Call bell and phone system explained to patient. Will continue to monitor and treat per MD orders.

## 2017-06-20 NOTE — Progress Notes (Addendum)
Results for [name], [name] (MRN [id number]) as of 06/20/2017 14:24  Ref. Range 06/20/2017 08:08 06/20/2017 12:16  Glucose-Capillary Latest Ref Range: 65 - 99 mg/dL 159 (H) 160 (H)  Received diabetes coordinator consult. Attempted to talk with patient, but he was sound asleep and snoring and did not wake to his name when called several times.   Recommend either holding Lantus 60 units every HS tonight or decreasing dosage to at least 1/2 of home dose. Blood sugars have remained less than 180 mg/dl today. Continue Novolog correction scale as ordered. Will continue to monitor blood sugars while in the hospital. Re-evaluate in am.   Harvel Ricks RN BSN CDE Diabetes Coordinator Pager: [phone]  8am-5pm

## 2017-06-20 NOTE — Progress Notes (Signed)
This is a no charge note  Pending admission per Dr. Vanita Panda  76 year old male with past medical history for diabetes, hypertension, hyperlipidemia, who presents with right arm numbness and weakness and elevated blood pressure 220/140. CT head is negative. MRI of the brain was done with pending report, but per EDP, the MRI of brain showed infarction. Neurology Dr. Leonel Ramsay was consulted. Patient is admitted to telemetry bed as inpatient.   Ivor Costa, MD  [facility] Pager [phone]  If 7PM-7AM, please contact night-coverage [email or web address] Password [email or web address] 06/20/2017, 5:25 AM

## 2017-06-20 NOTE — ED Notes (Signed)
Pt returned from MRI at this time, states "that was weird, who would come up with that."

## 2017-06-20 NOTE — Progress Notes (Signed)
*  PRELIMINARY RESULTS* Vascular Ultrasound Carotid Duplex (Doppler) has been completed.  Findings suggest 1-39% internal carotid artery stenosis bilaterally. Vertebral arteries are patent with antegrade flow.  06/20/2017 12:03 PM Maudry Mayhew, BS, RVT, RDCS, RDMS

## 2017-06-20 NOTE — ED Provider Notes (Signed)
5:31 AM I discussed patient's MRI with our radiologist, and subsequently discussed the results with the patient. Subsequently I discussed patient's case with our hospitalist colleagues for admission for further evaluation and management of the patient's stroke.   Carmin Muskrat, MD 06/20/17 [phone]

## 2017-06-20 NOTE — Evaluation (Signed)
Occupational Therapy Evaluation Patient Details Name: Jake Gross MRN: [id number] DOB: [birth year] Today's Date: 06/20/2017    History of Present Illness Jake Gross is a 76 y.o. male with medical history significant for diabetes, hypertension, hyperlipidemia, arthritis presents to the emergency department with chief complaint right hand weakness/numbness tingling. Initial evaluation includes an MRI revealing Subcentimeter foci of acute/early subacute infarct within left posterior insula and the left superior parietal cortex extending in the subcortical white matter.   Clinical Impression   PTA Pt independent in ADL and mobility, drives etc. Pt is currently min guard for standing ADL and min A for some fine motor tasks required for ADL. Pt stated that his right hand is doing better than it was yesterday, but still with decreased coordination and mild ataxia noted during functional tasks. Pt lives alone and would benefit from 24 hour supervision initially for safety and to prevent hospital re-entry, but would benefit most from neuro OPOT to work on Right hand deficits. Pt will require continued skilled OT in the acute setting to maximize safety and independence in ADL and RUE. Next session to establish HEP for RUE using theraputtty.    Follow Up Recommendations  Outpatient OT;Supervision/Assistance - 24 hour ([facility])    Equipment Recommendations  3 in 1 bedside commode    Recommendations for Other Services       Precautions / Restrictions Precautions Precautions: Fall Restrictions Weight Bearing Restrictions: No      Mobility Bed Mobility Overal bed mobility: Needs Assistance Bed Mobility: Supine to Sit     Supine to sit: Supervision     General bed mobility comments: supervision for safety, no physical assist required, increased time and effort to perform due to RUE weakness  Transfers Overall transfer level: Needs assistance Equipment used:  None Transfers: Sit to/from Stand Sit to Stand: Supervision         General transfer comment: supervision for safety, no physical assist required    Balance Overall balance assessment: Needs assistance Sitting-balance support: No upper extremity supported;Feet supported Sitting balance-Leahy Scale: Fair Sitting balance - Comments: sitting EOB with no back support   Standing balance support: No upper extremity supported;During functional activity Standing balance-Leahy Scale: Fair Standing balance comment: no UE support able to perform static tasks at counterm while leaning against sink                           ADL either performed or assessed with clinical judgement   ADL Overall ADL's : Needs assistance/impaired Eating/Feeding: Set up;Sitting Eating/Feeding Details (indicate cue type and reason): Pt reports trouble with BUE tasks, but has been using LUE as "dominant hand" to adapt Grooming: Wash/dry hands;Wash/dry face;Oral care;Min guard;Standing;Cueing for compensatory techniques Grooming Details (indicate cue type and reason): sink level Upper Body Bathing: Minimal assistance   Lower Body Bathing: Min guard   Upper Body Dressing : Minimal assistance;Sitting Upper Body Dressing Details (indicate cue type and reason): to don hospital gown like robe Lower Body Dressing: Min guard;Sitting/lateral leans Lower Body Dressing Details (indicate cue type and reason): to don socks Toilet Transfer: Min Psychiatric nurse Details (indicate cue type and reason): walking  Toileting- Water quality scientist and Hygiene: Min guard   Tub/ Shower Transfer: Min guard;Ambulation   Functional mobility during ADLs: Min guard General ADL Comments: Pt reports that function in R hand is coming back compared to yesterday, tingling still present - impacts ability to perform ADL  Vision Baseline Vision/History: Wears glasses Wears Glasses: Reading only Patient Visual Report: No  change from baseline Vision Assessment?: No apparent visual deficits     Perception     Praxis      Pertinent Vitals/Pain Pain Assessment: No/denies pain     Hand Dominance Right   Extremity/Trunk Assessment Upper Extremity Assessment Upper Extremity Assessment: RUE deficits/detail RUE Deficits / Details: RUE grossly 3/5 in grasp, increased time required for fine motor movement, and ataxia noted during finger to nose exercise.  RUE Sensation: decreased light touch RUE Coordination: decreased fine motor;decreased gross motor   Lower Extremity Assessment Lower Extremity Assessment: Defer to PT evaluation       Communication Communication Communication: No difficulties   Cognition Arousal/Alertness: Awake/alert Behavior During Therapy: WFL for tasks assessed/performed Overall Cognitive Status: Within Functional Limits for tasks assessed                                     General Comments  Pt lives alone and 24 hour care and assist for safety in bathroom is beneficial to decreased fall risk/hospital readmission    Exercises Exercises: Other exercises Other Exercises Other Exercises: digit extension/flexion in R hand   Shoulder Instructions      Home Living Family/patient expects to be discharged to:: Private residence Living Arrangements: Alone Available Help at Discharge: Family Type of Home: House Home Access: Stairs to enter Technical brewer of Steps: 6 Entrance Stairs-Rails: Right;Left;Can reach both Home Layout: One level     Bathroom Shower/Tub: Occupational psychologist: Standard     Home Equipment: None          Prior Functioning/Environment Level of Independence: Independent        Comments: driving, community mobilizer        OT Problem List: Decreased strength;Decreased range of motion;Decreased coordination;Impaired sensation;Impaired UE functional use      OT Treatment/Interventions: Self-care/ADL  training;Therapeutic exercise;Therapeutic activities;Patient/family education;Balance training    OT Goals(Current goals can be found in the care plan section) Acute Rehab OT Goals Patient Stated Goal: to get better OT Goal Formulation: With patient Time For Goal Achievement: 07/04/17 Potential to Achieve Goals: Good ADL Goals Pt Will Perform Grooming: with modified independence;standing Pt Will Perform Upper Body Bathing: with modified independence;sitting Pt Will Perform Lower Body Bathing: with modified independence;sitting/lateral leans Pt Will Transfer to Toilet: with modified independence;ambulating;regular height toilet Pt Will Perform Toileting - Clothing Manipulation and hygiene: with modified independence;sit to/from stand Pt/caregiver will Perform Home Exercise Program: Right Upper extremity;With theraputty;With written HEP provided  OT Frequency: Min 3X/week   Barriers to D/C: Decreased caregiver support  Pt lives alone - would need 24 hour care initially for safety       Co-evaluation PT/OT/SLP Co-Evaluation/Treatment: Yes Reason for Co-Treatment: For patient/therapist safety;To address functional/ADL transfers   OT goals addressed during session: ADL's and self-care      AM-PAC PT "6 Clicks" Daily Activity     Outcome Measure Help from another person eating meals?: A Little Help from another person taking care of personal grooming?: A Little Help from another person toileting, which includes using toliet, bedpan, or urinal?: A Little Help from another person bathing (including washing, rinsing, drying)?: A Little Help from another person to put on and taking off regular upper body clothing?: A Little Help from another person to put on and taking off regular lower body clothing?: A  Little 6 Click Score: 18   End of Session Equipment Utilized During Treatment: Gait belt Nurse Communication: Mobility status  Activity Tolerance: Patient tolerated treatment  well Patient left: Other (comment) (in [place] going to procedure)  OT Visit Diagnosis: Unsteadiness on feet (R26.81);Hemiplegia and hemiparesis Hemiplegia - Right/Left: Right Hemiplegia - dominant/non-dominant: Dominant Hemiplegia - caused by: Cerebral infarction                Time: 0321-2248 OT Time Calculation (min): 18 min Charges:  OT General Charges $OT Visit: 1 Visit OT Evaluation $OT Eval Moderate Complexity: 1 Mod G-Codes:     Hulda Humphrey OTR/L [place] 06/20/2017, 3:21 PM

## 2017-06-20 NOTE — ED Notes (Signed)
[facility] notified of positive MRI.

## 2017-06-20 NOTE — Evaluation (Signed)
Physical Therapy Evaluation Patient Details Name: Jake Gross MRN: [id number] DOB: 04-Sep-1941 Today's Date: 06/20/2017   History of Present Illness  Jake Gross is a 76 y.o. male with medical history significant for diabetes, hypertension, hyperlipidemia, arthritis presents to the emergency department with chief complaint right hand weakness/numbness tingling. Initial evaluation includes an MRI revealing Subcentimeter foci of acute/early subacute infarct within left posterior insula and the left superior parietal cortex extending in the subcortical white matter.  Clinical Impression  Orders received for PT evaluation. Patient demonstrates deficits in functional mobility as indicated below. Will benefit from continued skilled PT to address deficits and maximize function. Will see as indicated and progress as tolerated.  At this time, feel patient would benefit from outpatient PT for higher level balance and gait program if is able to arrange transport and supervision at home initially. If unable to arrange adequate assist, may need to consider post acute rehabilitation.    Follow Up Recommendations Outpatient PT;Supervision/Assistance - 24 hour (intially, if unable to arrange may need to consider rehab)    Equipment Recommendations  None recommended by PT    Recommendations for Other Services       Precautions / Restrictions Precautions Precautions: Fall Restrictions Weight Bearing Restrictions: No      Mobility  Bed Mobility Overal bed mobility: Needs Assistance Bed Mobility: Supine to Sit     Supine to sit: Supervision     General bed mobility comments: supervision for safety, no physical assist required, increased time and effort to perform due to RUE weakness  Transfers Overall transfer level: Needs assistance Equipment used: None Transfers: Sit to/from Stand Sit to Stand: Supervision         General transfer comment: supervision for safety, no physical assist  required  Ambulation/Gait Ambulation/Gait assistance: Min guard Ambulation Distance (Feet): 35 Feet Assistive device: None Gait Pattern/deviations: Step-through pattern;Decreased stride length;Narrow base of support Gait velocity: decreased   General Gait Details: modest instability noted during ambulation, no physical assist required, no assistive device. Min guard for Scientist, research (medical)    Modified Rankin (Stroke Patients Only) Modified Rankin (Stroke Patients Only) Pre-Morbid Rankin Score: No symptoms Modified Rankin: Moderately severe disability     Balance Overall balance assessment: Needs assistance   Sitting balance-Leahy Scale: Fair       Standing balance-Leahy Scale: Fair Standing balance comment: no UE support able to perform static tasks at counter                             Pertinent Vitals/Pain Pain Assessment: No/denies pain    Home Living Family/patient expects to be discharged to:: Private residence Living Arrangements: Alone Available Help at Discharge: Family Type of Home: House Home Access: Stairs to enter Entrance Stairs-Rails: Right;Left;Can reach both Technical brewer of Steps: 6 Home Layout: One level Home Equipment: None      Prior Function Level of Independence: Independent         Comments: driving,      Hand Dominance   Dominant Hand: Right    Extremity/Trunk Assessment   Upper Extremity Assessment Upper Extremity Assessment: Defer to OT evaluation;RUE deficits/detail RUE Deficits / Details: RUE weakness noted RUE Sensation: decreased light touch RUE Coordination: decreased fine motor;decreased gross motor    Lower Extremity Assessment Lower Extremity Assessment: Overall WFL for tasks assessed       Communication  Communication: No difficulties  Cognition Arousal/Alertness: Awake/alert Behavior During Therapy: WFL for tasks assessed/performed Overall Cognitive  Status: Within Functional Limits for tasks assessed                                        General Comments      Exercises     Assessment/Plan    PT Assessment Patient needs continued PT services  PT Problem List Decreased strength;Decreased activity tolerance;Decreased balance;Decreased mobility;Decreased coordination;Impaired sensation       PT Treatment Interventions DME instruction;Gait training;Stair training;Functional mobility training;Therapeutic activities;Therapeutic exercise;Balance training;Neuromuscular re-education;Patient/family education    PT Goals (Current goals can be found in the Care Plan section)  Acute Rehab PT Goals Patient Stated Goal: to get better PT Goal Formulation: With patient Time For Goal Achievement: 07/04/17 Potential to Achieve Goals: Good    Frequency Min 4X/week   Barriers to discharge Decreased caregiver support      Co-evaluation               AM-PAC PT "6 Clicks" Daily Activity  Outcome Measure Difficulty turning over in bed (including adjusting bedclothes, sheets and blankets)?: A Little Difficulty moving from lying on back to sitting on the side of the bed? : A Little Difficulty sitting down on and standing up from a chair with arms (e.g., wheelchair, bedside commode, etc,.)?: A Little Help needed moving to and from a bed to chair (including a wheelchair)?: A Little Help needed walking in hospital room?: A Little Help needed climbing 3-5 steps with a railing? : A Lot 6 Click Score: 17    End of Session Equipment Utilized During Treatment: Gait belt Activity Tolerance: Patient tolerated treatment well Patient left:  (in wheel chair with transport) Nurse Communication: Mobility status PT Visit Diagnosis: Unsteadiness on feet (R26.81);Muscle weakness (generalized) (M62.81);Other symptoms and signs involving the nervous system (R29.898)    Time: 5929-2446 PT Time Calculation (min) (ACUTE ONLY): 17  min   Charges:   PT Evaluation $PT Eval Moderate Complexity: 1 Mod     PT G Codes:        Alben Deeds, PT DPT  Board Certified Neurologic Specialist [place] 06/20/2017, 1:58 PM

## 2017-06-20 NOTE — Care Management Note (Addendum)
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 Pt with hx of diabetes, hypertension, hyperlipidemia, arthritis. PTA independent with ADL's and no DME usage. Resides alone.  Raquel Sarna Dearing (Son)     [phone]           PCP: Webb Silversmith  Action/Plan: Stroke work in process... PT, OT evaluations pending...Marland KitchenCM to f/u with disposition needs.  Expected Discharge Date:                  Expected Discharge Plan:     In-House Referral:   [facility]  Discharge planning Services  CM Consult  Post Acute Care Choice:    Choice offered to:     DME Arranged:    DME Agency:     HH Arranged:    HH Agency:     Status of Service:  In process, will continue to follow  If discussed at Long Length of Stay Meetings, dates discussed:    Additional Comments: 06/21/2017 1020  CM shared PT/OT evaluation/recommendations to pt:  Outpatient PT;Supervision/Assistance - 24 hour (intially, if unable to arrange may need to consider rehab) Outpatient OT;Supervision/Assistance - 24 hour ([place]). Pt states lives alone, has supportive son but not available untl after 5pm. Son works from 8am-5pm, M-F. Pt states will check with brother's availiablity and willingness to take him to outpatient therapy.  CM made referral to [facility] @ [phone]  for home 1st program vs home with outpatient therapy with 24/7 supervision from family /friends.     Whitman Hero [place], RN 06/20/2017, 10:57 AM

## 2017-06-21 ENCOUNTER — Inpatient Hospital Stay (HOSPITAL_COMMUNITY): Payer: Medicare Other

## 2017-06-21 ENCOUNTER — Ambulatory Visit: Payer: Medicare Other | Admitting: Endocrinology

## 2017-06-21 DIAGNOSIS — I63412 Cerebral infarction due to embolism of left middle cerebral artery: Principal | ICD-10-CM

## 2017-06-21 DIAGNOSIS — I638 Other cerebral infarction: Secondary | ICD-10-CM | POA: Diagnosis not present

## 2017-06-21 LAB — GLUCOSE, CAPILLARY
GLUCOSE-CAPILLARY: 161 mg/dL — AB (ref 65–99)
GLUCOSE-CAPILLARY: 182 mg/dL — AB (ref 65–99)
Glucose-Capillary: 152 mg/dL — ABNORMAL HIGH (ref 65–99)
Glucose-Capillary: 210 mg/dL — ABNORMAL HIGH (ref 65–99)

## 2017-06-21 LAB — ECHOCARDIOGRAM COMPLETE
Height: 67 in
Weight: 3688 oz

## 2017-06-21 MED ORDER — METHOTREXATE 2.5 MG PO TABS
20.0000 mg | ORAL_TABLET | ORAL | Status: DC
  Administered 2017-06-22: 20 mg via ORAL
  Filled 2017-06-21: qty 8

## 2017-06-21 NOTE — Progress Notes (Signed)
Met with patient to review insulin administration at home.  He admits to using Lantus 80 units qhs and Humalog 110units bid with breakfast and supper.  He also takes Humalog 10 units with snacks. Patient tells me he gives his insulin in 2 spots on his belly, Lantus on one side and Humalog on the other.  I have asked him to use his entire abdomen for better absorption and to give the 110 units and the 80 units in smaller divided units also for better absorption.  We reviewed the use of the insulin pen- he keeps his insulin in the fridge.   We reviewed the treatment of low blood sugars - he has peanut butter crackers and glucose tablets in his cars, in his room and in the kitchen and living room.  I have asked him to also keep soft peppermints in each of these areas or a tube of cake icing or a juice box in case he cannot chew while having a low blood sugar.   I have placed a referral to [facility] to follow up with him when he goes home.  Please reassess insulin needs before patient goes home- he lives alone.   Gentry Fitz, RN, BA, MHA, CDE Diabetes Coordinator Inpatient Diabetes Program  [phone] (Team Pager) [phone] ([place]) 06/21/2017 3:23 PM

## 2017-06-21 NOTE — Consult Note (Signed)
   [facility] Inpatient Consult   06/21/2017  Tomy Pegg 01-18-1941 [id number]    Made aware of [facility] referral.   Spoke with with Bethena Roys with [facility] and inpatient RNCM to further discuss.   Telephone call into Mr. Yutzy's room. Spoke with Mr. Surgeon to explain that should he have transportation or medication needs, [facility] Care Management will be available. Mr. [place]wood gave verbal consent for [place] Management services should he need them while on the [place] with [place] aware that verbal consent was received for [place] Management if there are transportation or medication needs.   Marthenia Rolling, MSN-Ed, RN,BSN [facility] Liaison [phone]

## 2017-06-21 NOTE — Progress Notes (Signed)
Physical Therapy Treatment Patient Details Name: Jake Gross MRN: [id number] DOB: 1940-11-04 Today's Date: 06/21/2017    History of Present Illness Jake Gross is a 76 y.o. male with medical history significant for diabetes, hypertension, hyperlipidemia, arthritis presents to the emergency department with chief complaint right hand weakness/numbness tingling. Initial evaluation includes an MRI revealing Subcentimeter foci of acute/early subacute infarct within left posterior insula and the left superior parietal cortex extending in the subcortical white matter.    PT Comments    Pt has made noticeable improvements in gait stability and quality.  He is at an independent level, but during gait trial he did fatigue and start to limp mildly due to R hip discomfort.    Follow Up Recommendations  No PT follow up     Equipment Recommendations  None recommended by PT    Recommendations for Other Services       Precautions / Restrictions Precautions Precautions: Fall    Mobility  Bed Mobility Overal bed mobility: Needs Assistance Bed Mobility: Supine to Sit;Sit to Supine     Supine to sit: Independent Sit to supine: Independent      Transfers Overall transfer level: Independent Equipment used: None                Ambulation/Gait Ambulation/Gait assistance: Independent Ambulation Distance (Feet): 1000 Feet Assistive device: None Gait Pattern/deviations: Step-through pattern;Decreased stride length;Narrow base of support   Gait velocity interpretation: at or above normal speed for age/gender General Gait Details: steady gait overall with mild antalgia on the R after 8-900 feet as he got sore.   Stairs Stairs: Yes   Stair Management: One rail Right;Alternating pattern Number of Stairs: 4 General stair comments: safe with rail  Wheelchair Mobility    Modified Rankin (Stroke Patients Only) Modified Rankin (Stroke Patients Only) Pre-Morbid Rankin Score:  No symptoms Modified Rankin: Slight disability     Balance Overall balance assessment: Modified Independent   Sitting balance-Leahy Scale: Good       Standing balance-Leahy Scale: Good                   Standardized Balance Assessment Standardized Balance Assessment : Dynamic Gait Index   Dynamic Gait Index Level Surface: Normal Change in Gait Speed: Normal Gait with Horizontal Head Turns: Normal Gait with Vertical Head Turns: Normal Gait and Pivot Turn: Normal Step Over Obstacle: Mild Impairment Step Around Obstacles: Normal Steps: Mild Impairment Total Score: 22      Cognition Arousal/Alertness: Awake/alert Behavior During Therapy: WFL for tasks assessed/performed Overall Cognitive Status: Within Functional Limits for tasks assessed                                        Exercises      General Comments General comments (skin integrity, edema, etc.): Today, very little sign of stroke with dynamic activity in the halls      Pertinent Vitals/Pain Pain Assessment: Faces Faces Pain Scale: Hurts little more Pain Location: R hip pain after extensive walking Pain Descriptors / Indicators: Aching Pain Intervention(s): Monitored during session    Home Living                      Prior Function            PT Goals (current goals can now be found in the care plan section) Acute Rehab PT Goals Patient  Stated Goal: to get better PT Goal Formulation: With patient Time For Goal Achievement: 07/04/17 Potential to Achieve Goals: Good Progress towards PT goals: Progressing toward goals    Frequency    Min 3X/week      PT Plan Current plan remains appropriate    Co-evaluation              AM-PAC PT "6 Clicks" Daily Activity  Outcome Measure  Difficulty turning over in bed (including adjusting bedclothes, sheets and blankets)?: None Difficulty moving from lying on back to sitting on the side of the bed? :  None Difficulty sitting down on and standing up from a chair with arms (e.g., wheelchair, bedside commode, etc,.)?: None Help needed moving to and from a bed to chair (including a wheelchair)?: None Help needed walking in hospital room?: None Help needed climbing 3-5 steps with a railing? : A Little 6 Click Score: 23    End of Session   Activity Tolerance: Patient tolerated treatment well Patient left: in chair;with call bell/phone within reach Nurse Communication: Mobility status PT Visit Diagnosis: Other abnormalities of gait and mobility (R26.89)     Time: 1173-5670 PT Time Calculation (min) (ACUTE ONLY): 19 min  Charges:  $Gait Training: 8-22 mins                    G Codes:       2017/07/10  Donnella Sham, [place] [phone]  (pager)   Jake Gross 10-Jul-2017, 6:21 PM

## 2017-06-21 NOTE — Evaluation (Signed)
Speech Language Pathology Evaluation Patient Details Name: Jake Gross MRN: [id number] DOB: 1940/11/05 Today's Date: 06/21/2017 Time: 0850-0907 SLP Time Calculation (min) (ACUTE ONLY): 17 min  Problem List:  Patient Active Problem List   Diagnosis Date Noted  . 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 Stroke ([place]) 06/20/2017  . HLD (hyperlipidemia) 09/14/2015  . Hypertension   . 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 Arthritis 09/16/2012  . Diabetes mellitus type 2, insulin dependent ([place]) 08/18/2011   Past Medical History:  Past Medical History:  Diagnosis Date  . 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 Arthritis   . Cervical spondylosis   . Diabetes mellitus   . Hyperlipidemia   . Hypertension   . Stroke ([place]) 06/19/2017  . Vitamin D deficiency    Past Surgical History:  Past Surgical History:  Procedure Laterality Date  . ABCESS DRAINAGE Left    buttocks  . CARDIOVASCULAR STRESS TEST  10/12/1999   EF 63%. NO ISCHEMIA  . COLONOSCOPY W/ POLYPECTOMY     5 polyps  . EUS N/A 03/11/2015   Procedure: LOWER ENDOSCOPIC ULTRASOUND (EUS);  Surgeon: Milus Banister, MD;  Location: [facility];  Service: Endoscopy;  Laterality: N/A;  . FLEXIBLE SIGMOIDOSCOPY N/A 02/02/2015   Procedure: FLEXIBLE SIGMOIDOSCOPY;  Surgeon: Inda Castle, MD;  Location: [facility] ENDOSCOPY;  Service: Endoscopy;  Laterality: N/A;  ERBE  . PARTIAL PROCTECTOMY BY TEM N/A 04/08/2015   Procedure: TEM PARTIAL PROCTECTOMY OF RECTAL MASS;  Surgeon: Michael Boston, MD;  Location: [facility] ORS;  Service: General;  Laterality: N/A;  . TONSILLECTOMY AND ADENOIDECTOMY     as child   HPI:  pt is a 76 yo male adm to [facility] with right arm weakness and gait disturbance.  Pt found to have left parietal superior cortex CVA with extension to white matter.  PMH + for arthritis, DM, HTN, HLD.     Assessment / Plan / Recommendation Clinical  Impression  MOCA 7.3 administered with pt scoring 24/30- indicative of mild cognitive impairment.  Strengths include memory (recalled 4/5 words independently), language, abstract thought and orientation.  Verbal fluency and mental math were challenging for pt - due to impact of cva or baseline.   Expressive and receptive language is fluent without difficulties and no focal CN deficits impacting speech noted.  Educated pt to findings/recommendations and will sign off.      SLP Assessment  SLP Recommendation/Assessment: Patient does not need any further Speech Lanaguage Pathology Services    Follow Up Recommendations  None    Frequency and Duration           SLP Evaluation Cognition  Overall Cognitive Status: Within Functional Limits for tasks assessed Arousal/Alertness: Awake/alert Orientation Level: Oriented X4 Attention: Sustained Sustained Attention: Appears intact Memory: Impaired Memory Impairment: Retrieval deficit (recalled 4/5 words independently) Awareness: Appears intact (to right hand deficits)       Comprehension  Auditory Comprehension Overall Auditory Comprehension: Appears within functional limits for tasks assessed Yes/No Questions: Not tested Commands: Within Functional Limits Conversation: Complex Visual Recognition/Discrimination Discrimination: Within Function Limits Reading Comprehension Reading Status: Not tested    Expression Expression Primary Mode of Expression: Verbal Verbal Expression Overall Verbal Expression: Appears within functional limits for tasks assessed Initiation: No impairment Repetition: No impairment Naming: No impairment Pragmatics: No impairment Written Expression Dominant Hand: Right Written Expression:  (difficulty with mechanics due to right hand impairment)   Oral /  Motor  Oral Motor/Sensory Function Overall Oral Motor/Sensory Function: Within functional limits Motor Speech Overall Motor Speech: Appears within functional  limits for tasks assessed Respiration: Within functional limits Resonance: Within functional limits Articulation: Within functional limitis Intelligibility: Intelligible Motor Planning: Witnin functional limits   GO                    Jake Gross 06/21/2017, 9:31 AM Luanna Salk, [facility] SLP [phone]

## 2017-06-21 NOTE — Progress Notes (Signed)
Inpatient Diabetes Program Recommendations  AACE/ADA: New Consensus Statement on Inpatient Glycemic Control (2015)  Target Ranges:  Prepandial:   less than 140 mg/dL      Peak postprandial:   less than 180 mg/dL (1-2 hours)      Critically ill patients:  140 - 180 mg/dL   Lab Results  Component Value Date   GLUCAP 161 (H) 06/21/2017   HGBA1C 7.3 (H) 06/20/2017    Review of Glycemic Control  Results for BINYOMIN, BRANN (MRN [id number]) as of 06/21/2017 12:38  Ref. Range 06/20/2017 12:16 06/20/2017 17:02 06/20/2017 21:23 06/21/2017 07:54 06/21/2017 11:25  Glucose-Capillary Latest Ref Range: 65 - 99 mg/dL 160 (H) 176 (H) 179 (H) 152 (H) 161 (H)    Diabetes history: Type 2 Outpatient Diabetes medications: Lantus 80 units qhs, Humalog 110 units bid  Current orders for Inpatient glycemic control: Novolog 0-9 units tid, Novolog 0-5 qhs  Inpatient Diabetes Program Recommendations:   Ideal blood sugars with minimal insulin while inpatient  (see home doses above).   Agree with current medications for blood sugar management.   Gentry Fitz, RN, BA, MHA, CDE Diabetes Coordinator Inpatient Diabetes Program  [phone] (Team Pager) [phone] ([place]) 06/21/2017 12:44 PM

## 2017-06-21 NOTE — Progress Notes (Signed)
PROGRESS NOTE    Jake Gross  WVP:[id number] DOB: 1941-01-16 DOA: 06/19/2017 PCP: Jearld Fenton, NP    Brief Narrative:  Patient is a 76 year old gentleman history of diabetes, hypertension, hyperlipidemia, arthritis presented to the ED with complaints of right hand weakness numbness and tingling. MRI consistent with an acute stroke. Patient admitted for stroke workup. Consult neurology.   Assessment & Plan:   Principal Problem:   Stroke [facility]) Active Problems:   Diabetes mellitus type 2, insulin dependent ([place])   Hypertension   HLD (hyperlipidemia)   #1 acute CVA MRI brain consistent with subcentimeter foci of acute/early subacute infarct within left posterior insula and left superior parietal cortex extending in the subcortical white matter. Concern for cardio embolic etiology. Patient with clinical improvement states right hand weakness has improved. Carotid Dopplers with no significant ICA stenosis. 2-D echo pending. Hemoglobin A1c 7.3. Check a fasting lipid panel. Permissive hypertension. Continue full dose aspirin for secondary stroke prophylaxis. Continue home dose Crestor. PT/OT/ST. Consult with neurology for further evaluation and management.  #2 hypertension Permissive hypertension. Antihypertensive medications on hold. Follow.  #3 hyperlipidemia Check a fasting lipid panel. Continue home regimen Crestor.  #4 diabetes mellitus type 2 insulin-dependent Hemoglobin A1c 7.3. CBGs have ranged from 152-179. Continue sliding scale insulin.   DVT prophylaxis: Lovenox Code Status: Full Family Communication: Updated patient. No family at bedside. Disposition Plan: Likely home once stroke workup is completed and medically stable hopefully tomorrow.   Consultants:   Neurology pending  Procedures:   2-D echo 06/21/2017 pending  CT head 06/20/2017  Chest x-ray 06/20/2017  MRI/MRA head 06/20/2017  Carotid Dopplers 06/20/2017  Antimicrobials:    None   Subjective: Laying in bed. States right hand pain has improved significantly. No chest pain. No shortness of breath. No weakness noted. Tolerating oral intake.  Objective: Vitals:   06/20/17 1700 06/20/17 2126 06/21/17 0155 06/21/17 0515  BP: (!) 179/63 (!) 161/42 (!) 182/54 (!) 153/58  Pulse: 60 (!) 57 (!) 55 (!) 53  Resp: 16 18 18 16   Temp:  98.1 F (36.7 C) 98.2 F (36.8 C) 97.7 F (36.5 C)  TempSrc:  Oral Oral Oral  SpO2: 97% 97% 95% 97%  Weight:      Height:        Intake/Output Summary (Last 24 hours) at 06/21/17 1236 Last data filed at 06/21/17 0451  Gross per 24 hour  Intake           851.25 ml  Output                0 ml  Net           851.25 ml   Filed Weights   06/19/17 2201 06/20/17 0639  Weight: 95.3 kg (210 lb) 104.6 kg (230 lb 8 oz)    Examination:  General exam: Appears calm and comfortable  Respiratory system: Clear to auscultation. Respiratory effort normal. Cardiovascular system: S1 & S2 heard, RRR. No JVD, murmurs, rubs, gallops or clicks. No pedal edema. Gastrointestinal system: Abdomen is nondistended, soft and nontender. No organomegaly or masses felt. Normal bowel sounds heard. Central nervous system: Alert and oriented. No focal neurological deficits. Extremities: Symmetric 5 x 5 power. Skin: No rashes, lesions or ulcers Psychiatry: Judgement and insight appear normal. Mood & affect appropriate.     Data Reviewed: I have personally reviewed following labs and imaging studies  CBC:  Recent Labs Lab 06/19/17 2305 06/19/17 2322  WBC 8.1  --   NEUTROABS 5.8  --  HGB 13.2 12.6*  HCT 40.0 37.0*  MCV 88.3  --   PLT 219  --    Basic Metabolic Panel:  Recent Labs Lab 06/19/17 2305 06/19/17 2322  NA 142 144  K 4.8 4.7  CL 111 111  CO2 24  --   GLUCOSE 89 85  BUN 31* 31*  CREATININE 1.22 1.10  CALCIUM 9.0  --    GFR: Estimated Creatinine Clearance: 67.9 mL/min (by C-G formula based on SCr of 1.1 mg/dL). Liver  Function Tests:  Recent Labs Lab 06/19/17 2305  AST 36  ALT 27  ALKPHOS 66  BILITOT 0.6  PROT 6.6  ALBUMIN 2.9*   No results for input(s): LIPASE, AMYLASE in the last 168 hours. No results for input(s): AMMONIA in the last 168 hours. Coagulation Profile:  Recent Labs Lab 06/19/17 2305  INR 1.02   Cardiac Enzymes: No results for input(s): CKTOTAL, CKMB, CKMBINDEX, TROPONINI in the last 168 hours. BNP (last 3 results) No results for input(s): PROBNP in the last 8760 hours. HbA1C:  Recent Labs  06/20/17 0749  HGBA1C 7.3*   CBG:  Recent Labs Lab 06/20/17 1216 06/20/17 1702 06/20/17 2123 06/21/17 0754 06/21/17 1125  GLUCAP 160* 176* 179* 152* 161*   Lipid Profile: No results for input(s): CHOL, HDL, LDLCALC, TRIG, CHOLHDL, LDLDIRECT in the last 72 hours. Thyroid Function Tests: No results for input(s): TSH, T4TOTAL, FREET4, T3FREE, THYROIDAB in the last 72 hours. Anemia Panel: No results for input(s): VITAMINB12, FOLATE, FERRITIN, TIBC, IRON, RETICCTPCT in the last 72 hours. Sepsis Labs: No results for input(s): PROCALCITON, LATICACIDVEN in the last 168 hours.  No results found for this or any previous visit (from the past 240 hour(s)).       Radiology Studies: Dg Chest 1 View  Result Date: 06/20/2017 CLINICAL DATA:  Stroke.  Ex-smoker. EXAM: CHEST 1 VIEW COMPARISON:  Chest CT dated 02/19/2015. FINDINGS: Poor inspiration. Interval mild enlargement of the cardiac silhouette, magnified by the poor inspiration and AP technique. Clear lungs with mild diffuse prominence of the interstitial markings. No pleural fluid. Thoracic spine degenerative changes. IMPRESSION: 1. Interval mild cardiomegaly. 2. Interval mild chronic interstitial lung disease. Electronically Signed   By: Claudie Revering M.D.   On: 06/20/2017 14:50   Ct Head Wo Contrast  Result Date: 06/20/2017 CLINICAL DATA:  77 y/o M; loss of feeling to the right arm, stroke suspected. EXAM: CT HEAD WITHOUT  CONTRAST TECHNIQUE: Contiguous axial images were obtained from the base of the skull through the vertex without intravenous contrast. COMPARISON:  None. FINDINGS: Brain: No evidence of acute infarction, hemorrhage, hydrocephalus, extra-axial collection or mass lesion/mass effect. Mild brain parenchymal volume loss and chronic microvascular ischemic changes of the brain. Vascular: Calcific atherosclerosis of carotid siphons. No hyperdense vessel. Skull: Negative. Sinuses/Orbits: Tiny left sphenoid sinus mucous retention cyst. Otherwise normal aeration of paranasal sinuses and mastoid air cells. Orbits are unremarkable. Other: Negative peer IMPRESSION: 1. No acute intracranial abnormality identified. 2. Mild chronic microvascular ischemic changes and mild parenchymal volume loss of the brain. Electronically Signed   By: Kristine Garbe M.D.   On: 06/20/2017 01:36   Mr Jeri Cos And Wo Contrast  Result Date: 06/20/2017 CLINICAL DATA:  76 y/o M; sudden onset of right forearm and hand numbness. EXAM: MRI HEAD WITHOUT AND WITH CONTRAST MRI CERVICAL SPINE WITHOUT AND WITH CONTRAST TECHNIQUE: Multiplanar, multiecho pulse sequences of the brain and surrounding structures, and cervical spine, to include the craniocervical junction and cervicothoracic junction, were obtained  without and with intravenous contrast. CONTRAST:  67mL MULTIHANCE GADOBENATE DIMEGLUMINE 529 MG/ML IV SOLN COMPARISON:  06/20/2017 CT head. FINDINGS: MRI HEAD FINDINGS Brain: Subcentimeter focus of reduced diffusion within the left posterior insula cortex (series 3: Image 29). Additional subcentimeter focus within the left superior parietal cortex extending in the subcortical white matter (3:37). Mild chronic microvascular ischemic changes of the brain and mild brain parenchymal volume loss. Right frontal subcortical white matter punctate focus of susceptibility hypointensity compatible with hemosiderin deposition of chronic microhemorrhage. No  focal mass effect, hydrocephalus, or extra-axial collection. No abnormal enhancement. Vascular: Normal flow voids. Skull and upper cervical spine: Normal marrow signal. Sinuses/Orbits: Negative. Other: None. MRI CERVICAL SPINE FINDINGS Extensive motion artifact. Alignment: Straightening of cervical lordosis.  No listhesis. Vertebrae: No fracture, evidence of discitis, or bone lesion. No abnormal enhancement. Cord: Normal signal and morphology.  No abnormal enhancement. Posterior Fossa, vertebral arteries, paraspinal tissues: Negative. Disc levels: C2-3: Left-sided disc osteophyte complex and facet hypertrophy with moderate left foraminal stenosis. No significant canal stenosis. C3-4: Disc osteophyte complex with bilateral uncovertebral and facet hypertrophy greater on the left. Mild right and moderate to severe left foraminal stenosis. Mild canal stenosis. C4-5: Disc osteophyte complex with bilateral uncovertebral and facet hypertrophy. Moderate to severe bilateral foraminal stenosis. Mild canal stenosis. C5-6: Disc osteophyte complex with bilateral uncovertebral and facet hypertrophy. Severe bilateral foraminal stenosis and moderate canal stenosis. C6-7: Disc osteophyte complex with bilateral uncovertebral and facet hypertrophy. Moderate bilateral foraminal stenosis and mild canal stenosis. C7-T1: Left-sided disc osteophyte complex and facet hypertrophy with mild left foraminal stenosis. No significant canal stenosis. IMPRESSION: MRI brain: 1. Subcentimeter foci of acute/early subacute infarct within left posterior insula and the left superior parietal cortex extending in the subcortical white matter. No acute hemorrhage. 2. Mild for age chronic microvascular ischemic changes and mild parenchymal volume loss of the brain. No abnormal enhancement of the brain. MRI cervical: 1. 2. No acute osseous abnormality or abnormal bone marrow signal. No abnormal cord signal or enhancement. 3. Cervical spondylosis greatest at  the C5-6 level with there is severe bilateral foraminal stenosis and moderate canal stenosis. These results were called by telephone at the time of interpretation on 06/20/2017 at 5:15 am to Dr. Vanita Panda, who verbally acknowledged these results. Electronically Signed   By: Kristine Garbe M.D.   On: 06/20/2017 05:22   Mr Cervical Spine W Or Wo Contrast  Result Date: 06/20/2017 CLINICAL DATA:  76 y/o M; sudden onset of right forearm and hand numbness. EXAM: MRI HEAD WITHOUT AND WITH CONTRAST MRI CERVICAL SPINE WITHOUT AND WITH CONTRAST TECHNIQUE: Multiplanar, multiecho pulse sequences of the brain and surrounding structures, and cervical spine, to include the craniocervical junction and cervicothoracic junction, were obtained without and with intravenous contrast. CONTRAST:  69mL MULTIHANCE GADOBENATE DIMEGLUMINE 529 MG/ML IV SOLN COMPARISON:  06/20/2017 CT head. FINDINGS: MRI HEAD FINDINGS Brain: Subcentimeter focus of reduced diffusion within the left posterior insula cortex (series 3: Image 29). Additional subcentimeter focus within the left superior parietal cortex extending in the subcortical white matter (3:37). Mild chronic microvascular ischemic changes of the brain and mild brain parenchymal volume loss. Right frontal subcortical white matter punctate focus of susceptibility hypointensity compatible with hemosiderin deposition of chronic microhemorrhage. No focal mass effect, hydrocephalus, or extra-axial collection. No abnormal enhancement. Vascular: Normal flow voids. Skull and upper cervical spine: Normal marrow signal. Sinuses/Orbits: Negative. Other: None. MRI CERVICAL SPINE FINDINGS Extensive motion artifact. Alignment: Straightening of cervical lordosis.  No listhesis. Vertebrae: No fracture,  evidence of discitis, or bone lesion. No abnormal enhancement. Cord: Normal signal and morphology.  No abnormal enhancement. Posterior Fossa, vertebral arteries, paraspinal tissues: Negative. Disc  levels: C2-3: Left-sided disc osteophyte complex and facet hypertrophy with moderate left foraminal stenosis. No significant canal stenosis. C3-4: Disc osteophyte complex with bilateral uncovertebral and facet hypertrophy greater on the left. Mild right and moderate to severe left foraminal stenosis. Mild canal stenosis. C4-5: Disc osteophyte complex with bilateral uncovertebral and facet hypertrophy. Moderate to severe bilateral foraminal stenosis. Mild canal stenosis. C5-6: Disc osteophyte complex with bilateral uncovertebral and facet hypertrophy. Severe bilateral foraminal stenosis and moderate canal stenosis. C6-7: Disc osteophyte complex with bilateral uncovertebral and facet hypertrophy. Moderate bilateral foraminal stenosis and mild canal stenosis. C7-T1: Left-sided disc osteophyte complex and facet hypertrophy with mild left foraminal stenosis. No significant canal stenosis. IMPRESSION: MRI brain: 1. Subcentimeter foci of acute/early subacute infarct within left posterior insula and the left superior parietal cortex extending in the subcortical white matter. No acute hemorrhage. 2. Mild for age chronic microvascular ischemic changes and mild parenchymal volume loss of the brain. No abnormal enhancement of the brain. MRI cervical: 1. 2. No acute osseous abnormality or abnormal bone marrow signal. No abnormal cord signal or enhancement. 3. 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 These results were called by telephone at the time of interpretation on 06/20/2017 at 5:15 am to Dr. Vanita Panda, who verbally acknowledged these results. Electronically Signed   By: Kristine Garbe M.D.   On: 06/20/2017 05:22   [name] Head Wo Contrast  Result Date: 06/20/2017 CLINICAL DATA:  Diabetes, hypertension and hyperlipidemia. Acute presentation with right hand weakness and numbness. Acute left MCA territory infarctions by MRI earlier  today. EXAM: MRA HEAD WITHOUT CONTRAST TECHNIQUE: Angiographic images of the Circle of Willis were obtained using MRA technique without intravenous contrast. COMPARISON:  MRI same day FINDINGS: Both internal carotid arteries show antegrade flow. No siphon stenosis. The anterior and middle cerebral vessels are patent. No missing branch vessels identifiable. Distal vessels do show some irregularity, particularly on the left. Both vertebral arteries are patent with the left being dominant. No basilar stenosis, though there is mild irregularity. Posterior circulation branch vessels are patent. Mild atherosclerotic irregularity of the more distal PCA branches right more than left. IMPRESSION: No large or medium vessel occlusion or correctable proximal stenosis identified. Some narrowing and irregularity of the more distal intracranial branch vessels. Electronically Signed   By: Nelson Chimes M.D.   On: 06/20/2017 11:25        Scheduled Meds: . aspirin  325 mg Oral Daily  . enoxaparin (LOVENOX) injection  40 mg Subcutaneous Q24H  . folic acid  1 mg Oral q morning - 10a  . insulin aspart  0-5 Units Subcutaneous QHS  . insulin aspart  0-9 Units Subcutaneous TID WC  . [START ON 06/22/2017] methotrexate  20 mg Oral Q Fri  . rosuvastatin  20 mg Oral QHS   Continuous Infusions: . sodium chloride 75 mL/hr at 06/21/17 0839     LOS: 1 day    Time spent: 55 minutes    Bernie Fobes, MD [facility] Pager [phone]  If 7PM-7AM, please contact night-coverage [email or web address] Password [facility] 06/21/2017, 12:36 PM

## 2017-06-21 NOTE — Progress Notes (Signed)
  Echocardiogram 2D Echocardiogram has been performed.  Jennette Dubin 06/21/2017, 10:04 AM

## 2017-06-21 NOTE — Consult Note (Addendum)
Requesting Physician: Dr. Grandville Silos    Chief Complaint: stroke  History obtained from:  Patient     HPI:                                                                                                                                         Jake Gross is an 76 y.o. male with hypertension, hyperlipidemia and diabetes. Patient states that he is doing fine yesterday until approximately 1:00 in the afternoon. At that time he had a sudden onset of right hand numbness along with right leg weakness. He attempted to stand up and he fell. For that reason he called his brother who brought him to the hospital. Patient is CT which was negative but however MRI showed a left parietal stroke. Patient was admitted and stroke workup was initiated.  Date last known well: Date: 06/20/2017 Time last known well: Time: 13:00 tPA Given: No: Out of the window NIHSS stroke scale of 0  Modified Rankin: Rankin Score=0  Past Medical History:  Diagnosis Date  . 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 Arthritis   . Cervical spondylosis   . Diabetes mellitus   . Hyperlipidemia   . Hypertension   . Stroke ([place]) 06/19/2017  . Vitamin D deficiency     Past Surgical History:  Procedure Laterality Date  . ABCESS DRAINAGE Left    buttocks  . CARDIOVASCULAR STRESS TEST  10/12/1999   EF 63%. NO ISCHEMIA  . COLONOSCOPY W/ POLYPECTOMY     5 polyps  . EUS N/A 03/11/2015   Procedure: LOWER ENDOSCOPIC ULTRASOUND (EUS);  Surgeon: Milus Banister, MD;  Location: [facility];  Service: Endoscopy;  Laterality: N/A;  . FLEXIBLE SIGMOIDOSCOPY N/A 02/02/2015   Procedure: FLEXIBLE SIGMOIDOSCOPY;  Surgeon: Inda Castle, MD;  Location: [facility];  Service: Endoscopy;  Laterality: N/A;  ERBE  . PARTIAL PROCTECTOMY BY TEM N/A 04/08/2015   Procedure: TEM PARTIAL PROCTECTOMY OF RECTAL MASS;  Surgeon: Michael Boston, MD;  Location: [facility];  Service: General;  Laterality: N/A;   . TONSILLECTOMY AND ADENOIDECTOMY     as child    Family History  Problem Relation Age of Onset  . Cancer Father        "all over"  . Coronary artery disease Brother   . Diabetes Brother   . Stroke Mother   . Diabetes Mother   . Colon cancer Neg Hx   . Esophageal cancer Neg Hx   . Rectal cancer Neg Hx   . Stomach cancer Neg Hx    Social History:  reports that he quit smoking about 22 years ago. His smoking use included Cigarettes. He has never used smokeless tobacco. He reports that he does not drink alcohol or use drugs.  Allergies: No Known Allergies  Medications:  Prior to Admission:  Prescriptions Prior to Admission  Medication Sig Dispense Refill Last Dose  . aspirin 81 MG tablet Take 162 mg by mouth at bedtime.    06/18/2017 at Unknown time  . folic acid (FOLVITE) 1 MG tablet Take 1 mg by mouth every morning.    06/19/2017 at Unknown time  . Insulin Glargine (LANTUS SOLOSTAR) 100 UNIT/ML Solostar Pen Inject 90 Units into the skin at bedtime. (Patient taking differently: Inject 80 Units into the skin at bedtime. ) 25 pen 3 06/18/2017 at Unknown time  . insulin lispro (HUMALOG KWIKPEN) 100 UNIT/ML KiwkPen 3 times a day (just before each meal) 85-10-85 units, and pen needles 3/day. (Patient taking differently: Inject 110 Units into the skin 2 (two) times daily. ) 50 pen 3 06/19/2017 at Unknown time  . losartan-hydrochlorothiazide (HYZAAR) 100-25 MG tablet TAKE 1 TABLET DAILY 90 tablet 1 06/19/2017 at Unknown time  . methotrexate (RHEUMATREX) 2.5 MG tablet Take 20 mg by mouth every Friday.   06/15/2017  . rosuvastatin (CRESTOR) 20 MG tablet Take 1 tablet (20 mg total) by mouth at bedtime. 90 tablet 1 06/18/2017 at Unknown time  . glucose blood (ONE TOUCH ULTRA TEST) test strip 1 each by Other route 2 (two) times daily. And lancets 2/day 200 each 3 Taking  . Insulin Pen  Needle 31G X 5 MM MISC 1 each by Does not apply route 3 (three) times daily as needed. 300 each 4 Taking   Scheduled: . aspirin  325 mg Oral Daily  . enoxaparin (LOVENOX) injection  40 mg Subcutaneous Q24H  . folic acid  1 mg Oral q morning - 10a  . insulin aspart  0-5 Units Subcutaneous QHS  . insulin aspart  0-9 Units Subcutaneous TID WC  . [START ON 06/22/2017] methotrexate  20 mg Oral Q Fri  . rosuvastatin  20 mg Oral QHS    ROS:                                                                                                                                       History obtained from the patient  General ROS: negative for - chills, fatigue, fever, night sweats, weight gain or weight loss Psychological ROS: negative for - behavioral disorder, hallucinations, memory difficulties, mood swings or suicidal ideation Ophthalmic ROS: negative for - blurry vision, double vision, eye pain or loss of vision ENT ROS: negative for - epistaxis, nasal discharge, oral lesions, sore throat, tinnitus or vertigo Allergy and Immunology ROS: negative for - hives or itchy/watery eyes Hematological and Lymphatic ROS: negative for - bleeding problems, bruising or swollen lymph nodes Endocrine ROS: negative for - galactorrhea, hair pattern changes, polydipsia/polyuria or temperature intolerance Respiratory ROS: negative for - cough, hemoptysis, shortness of breath or wheezing Cardiovascular ROS: negative for - chest pain, dyspnea on exertion, edema or irregular heartbeat Gastrointestinal ROS: negative for - abdominal pain, diarrhea, hematemesis, nausea/vomiting  or stool incontinence Genito-Urinary ROS: negative for - dysuria, hematuria, incontinence or urinary frequency/urgency Musculoskeletal ROS: negative for - joint swelling or muscular weakness Neurological ROS: as noted in HPI Dermatological ROS: negative for rash and skin lesion changes  Neurologic Examination:                                                                                                       Blood pressure (!) 153/58, pulse (!) 53, temperature 97.7 F (36.5 C), temperature source Oral, resp. rate 16, height 5\' 7"  (1.702 m), weight 104.6 kg (230 lb 8 oz), SpO2 97 %.  HEENT-  Normocephalic, no lesions, without obvious abnormality.  Normal external eye and conjunctiva.  Normal TM's bilaterally.  Normal auditory canals and external ears. Normal external nose, mucus membranes and septum.  Normal pharynx. Cardiovascular- S1, S2 normal, pulses palpable throughout   Lungs- tachypnea is not noted Abdomen- normal findings: bowel sounds normal Extremities- no edema Lymph-no adenopathy palpable Musculoskeletal-no joint tenderness, deformity or swelling Skin-warm and dry, no hyperpigmentation, vitiligo, or suspicious lesions  Neurological Examination Mental Status: Alert, oriented, thought content appropriate.  Speech fluent without evidence of aphasia.  Able to follow 3 step commands without difficulty. Cranial Nerves: II:  Visual fields grossly normal,  III,IV, VI: ptosis not present, extra-ocular motions intact bilaterally, pupils equal, round, reactive to light and accommodation V,VII: smile symmetric, facial light touch sensation normal bilaterally VIII: hearing normal bilaterally IX,X: uvula rises symmetrically XI: bilateral shoulder shrug XII: midline tongue extension Motor: Right : Upper extremity   5/5    Left:     Upper extremity   5/5  Lower extremity   5/5     Lower extremity   5/5 --Mild pronation drift to the right hand and arm when hands outstretched and head turning side-to-side otherwise no drift Tone and bulk:normal tone throughout; no atrophy noted Sensory: Pinprick and light touch intact throughout, bilaterally Deep Tendon Reflexes: 2+ and symmetric throughout Plantars: Right: downgoing   Left: downgoing Cerebellar: normal finger-to-nose,  and normal heel-to-shin test Gait: Not tested    NIH stroke  scale-0   Lab Results: Basic Metabolic Panel:  Recent Labs Lab 06/19/17 2305 06/19/17 2322  NA 142 144  K 4.8 4.7  CL 111 111  CO2 24  --   GLUCOSE 89 85  BUN 31* 31*  CREATININE 1.22 1.10  CALCIUM 9.0  --     Liver Function Tests:  Recent Labs Lab 06/19/17 2305  AST 36  ALT 27  ALKPHOS 66  BILITOT 0.6  PROT 6.6  ALBUMIN 2.9*   No results for input(s): LIPASE, AMYLASE in the last 168 hours. No results for input(s): AMMONIA in the last 168 hours.  CBC:  Recent Labs Lab 06/19/17 2305 06/19/17 2322  WBC 8.1  --   NEUTROABS 5.8  --   HGB 13.2 12.6*  HCT 40.0 37.0*  MCV 88.3  --   PLT 219  --     Cardiac Enzymes: No results for input(s): CKTOTAL, CKMB, CKMBINDEX, TROPONINI in the last 168 hours.  Lipid  Panel: No results for input(s): CHOL, TRIG, HDL, CHOLHDL, VLDL, LDLCALC in the last 168 hours.  CBG:  Recent Labs Lab 06/20/17 1216 06/20/17 1702 06/20/17 2123 06/21/17 0754 06/21/17 1125  GLUCAP 160* 176* 179* 152* 161*    Microbiology: Results for orders placed or performed during the hospital encounter of 04/11/15  Culture, blood (routine x 2)     Status: None   Collection Time: 04/11/15  4:58 PM  Result Value Ref Range Status   Specimen Description BLOOD LEFT ANTECUBITAL  Final   Special Requests BOTTLES DRAWN AEROBIC AND ANAEROBIC 5CC EA  Final   Culture   Final    NO GROWTH 5 DAYS Performed at [facility]    Report Status 04/17/2015 FINAL  Final  Urine culture     Status: None   Collection Time: 04/11/15  4:58 PM  Result Value Ref Range Status   Specimen Description URINE, CATHETERIZED  Final   Special Requests NONE  Final   Culture   Final    NO GROWTH 1 DAY Performed at [facility]    Report Status 04/13/2015 FINAL  Final  Culture, blood (routine x 2)     Status: None   Collection Time: 04/11/15  5:40 PM  Result Value Ref Range Status   Specimen Description BLOOD RIGHT ANTECUBITAL  Final   Special Requests  BOTTLES DRAWN AEROBIC AND ANAEROBIC 5CC EA  Final   Culture   Final    NO GROWTH 5 DAYS Performed at [facility]    Report Status 04/17/2015 FINAL  Final    Coagulation Studies:  Recent Labs  06/19/17 2305  LABPROT 13.3  INR 1.02    Imaging: Dg Chest 1 View  Result Date: 06/20/2017 CLINICAL DATA:  Stroke.  Ex-smoker. EXAM: CHEST 1 VIEW COMPARISON:  Chest CT dated 02/19/2015. FINDINGS: Poor inspiration. Interval mild enlargement of the cardiac silhouette, magnified by the poor inspiration and AP technique. Clear lungs with mild diffuse prominence of the interstitial markings. No pleural fluid. Thoracic spine degenerative changes. IMPRESSION: 1. Interval mild cardiomegaly. 2. Interval mild chronic interstitial lung disease. Electronically Signed   By: Claudie Revering M.D.   On: 06/20/2017 14:50   Ct Head Wo Contrast  Result Date: 06/20/2017 CLINICAL DATA:  76 y/o M; loss of feeling to the right arm, stroke suspected. EXAM: CT HEAD WITHOUT CONTRAST TECHNIQUE: Contiguous axial images were obtained from the base of the skull through the vertex without intravenous contrast. COMPARISON:  None. FINDINGS: Brain: No evidence of acute infarction, hemorrhage, hydrocephalus, extra-axial collection or mass lesion/mass effect. Mild brain parenchymal volume loss and chronic microvascular ischemic changes of the brain. Vascular: Calcific atherosclerosis of carotid siphons. No hyperdense vessel. Skull: Negative. Sinuses/Orbits: Tiny left sphenoid sinus mucous retention cyst. Otherwise normal aeration of paranasal sinuses and mastoid air cells. Orbits are unremarkable. Other: Negative peer IMPRESSION: 1. No acute intracranial abnormality identified. 2. Mild chronic microvascular ischemic changes and mild parenchymal volume loss of the brain. Electronically Signed   By: Kristine Garbe M.D.   On: 06/20/2017 01:36   Mr Jeri Cos And Wo Contrast  Result Date: 06/20/2017 CLINICAL DATA:  76 y/o M;  sudden onset of right forearm and hand numbness. EXAM: MRI HEAD WITHOUT AND WITH CONTRAST MRI CERVICAL SPINE WITHOUT AND WITH CONTRAST TECHNIQUE: Multiplanar, multiecho pulse sequences of the brain and surrounding structures, and cervical spine, to include the craniocervical junction and cervicothoracic junction, were obtained without and with intravenous contrast. CONTRAST:  60mL MULTIHANCE  GADOBENATE DIMEGLUMINE 529 MG/ML IV SOLN COMPARISON:  06/20/2017 CT head. FINDINGS: MRI HEAD FINDINGS Brain: Subcentimeter focus of reduced diffusion within the left posterior insula cortex (series 3: Image 29). Additional subcentimeter focus within the left superior parietal cortex extending in the subcortical white matter (3:37). Mild chronic microvascular ischemic changes of the brain and mild brain parenchymal volume loss. Right frontal subcortical white matter punctate focus of susceptibility hypointensity compatible with hemosiderin deposition of chronic microhemorrhage. No focal mass effect, hydrocephalus, or extra-axial collection. No abnormal enhancement. Vascular: Normal flow voids. Skull and upper cervical spine: Normal marrow signal. Sinuses/Orbits: Negative. Other: None. MRI CERVICAL SPINE FINDINGS Extensive motion artifact. Alignment: Straightening of cervical lordosis.  No listhesis. Vertebrae: No fracture, evidence of discitis, or bone lesion. No abnormal enhancement. Cord: Normal signal and morphology.  No abnormal enhancement. Posterior Fossa, vertebral arteries, paraspinal tissues: Negative. Disc levels: C2-3: Left-sided disc osteophyte complex and facet hypertrophy with moderate left foraminal stenosis. No significant canal stenosis. C3-4: Disc osteophyte complex with bilateral uncovertebral and facet hypertrophy greater on the left. Mild right and moderate to severe left foraminal stenosis. Mild canal stenosis. C4-5: Disc osteophyte complex with bilateral uncovertebral and facet hypertrophy. Moderate to  severe bilateral foraminal stenosis. Mild canal stenosis. C5-6: Disc osteophyte complex with bilateral uncovertebral and facet hypertrophy. Severe bilateral foraminal stenosis and moderate canal stenosis. C6-7: Disc osteophyte complex with bilateral uncovertebral and facet hypertrophy. Moderate bilateral foraminal stenosis and mild canal stenosis. C7-T1: Left-sided disc osteophyte complex and facet hypertrophy with mild left foraminal stenosis. No significant canal stenosis. IMPRESSION: MRI brain: 1. Subcentimeter foci of acute/early subacute infarct within left posterior insula and the left superior parietal cortex extending in the subcortical white matter. No acute hemorrhage. 2. Mild for age chronic microvascular ischemic changes and mild parenchymal volume loss of the brain. No abnormal enhancement of the brain. MRI cervical: 1. 2. No acute osseous abnormality or abnormal bone marrow signal. No abnormal cord signal or enhancement. 3. 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 These results were called by telephone at the time of interpretation on 06/20/2017 at 5:15 am to Dr. Vanita Panda, who verbally acknowledged these results. Electronically Signed   By: Kristine Garbe M.D.   On: 06/20/2017 05:22   Mr Cervical Spine W Or Wo Contrast  Result Date: 06/20/2017 CLINICAL DATA:  76 y/o M; sudden onset of right forearm and hand numbness. EXAM: MRI HEAD WITHOUT AND WITH CONTRAST MRI CERVICAL SPINE WITHOUT AND WITH CONTRAST TECHNIQUE: Multiplanar, multiecho pulse sequences of the brain and surrounding structures, and cervical spine, to include the craniocervical junction and cervicothoracic junction, were obtained without and with intravenous contrast. CONTRAST:  10mL MULTIHANCE GADOBENATE DIMEGLUMINE 529 MG/ML IV SOLN COMPARISON:  06/20/2017 CT head. FINDINGS: MRI HEAD FINDINGS Brain: Subcentimeter focus of reduced diffusion  within the left posterior insula cortex (series 3: Image 29). Additional subcentimeter focus within the left superior parietal cortex extending in the subcortical white matter (3:37). Mild chronic microvascular ischemic changes of the brain and mild brain parenchymal volume loss. Right frontal subcortical white matter punctate focus of susceptibility hypointensity compatible with hemosiderin deposition of chronic microhemorrhage. No focal mass effect, hydrocephalus, or extra-axial collection. No abnormal enhancement. Vascular: Normal flow voids. Skull and upper cervical spine: Normal marrow signal. Sinuses/Orbits: Negative. Other: None. MRI CERVICAL SPINE FINDINGS Extensive motion artifact. Alignment: Straightening of cervical lordosis.  No listhesis. Vertebrae: No fracture, evidence of discitis, or bone lesion. No abnormal enhancement.  Cord: Normal signal and morphology.  No abnormal enhancement. Posterior Fossa, vertebral arteries, paraspinal tissues: Negative. Disc levels: C2-3: Left-sided disc osteophyte complex and facet hypertrophy with moderate left foraminal stenosis. No significant canal stenosis. C3-4: Disc osteophyte complex with bilateral uncovertebral and facet hypertrophy greater on the left. Mild right and moderate to severe left foraminal stenosis. Mild canal stenosis. C4-5: Disc osteophyte complex with bilateral uncovertebral and facet hypertrophy. Moderate to severe bilateral foraminal stenosis. Mild canal stenosis. C5-6: Disc osteophyte complex with bilateral uncovertebral and facet hypertrophy. Severe bilateral foraminal stenosis and moderate canal stenosis. C6-7: Disc osteophyte complex with bilateral uncovertebral and facet hypertrophy. Moderate bilateral foraminal stenosis and mild canal stenosis. C7-T1: Left-sided disc osteophyte complex and facet hypertrophy with mild left foraminal stenosis. No significant canal stenosis. IMPRESSION: MRI brain: 1. Subcentimeter foci of acute/early subacute  infarct within left posterior insula and the left superior parietal cortex extending in the subcortical white matter. No acute hemorrhage. 2. Mild for age chronic microvascular ischemic changes and mild parenchymal volume loss of the brain. No abnormal enhancement of the brain. MRI cervical: 1. 2. No acute osseous abnormality or abnormal bone marrow signal. No abnormal cord signal or enhancement. 3. 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 These results were called by telephone at the time of interpretation on 06/20/2017 at 5:15 am to Dr. Vanita Panda, who verbally acknowledged these results. Electronically Signed   By: Kristine Garbe M.D.   On: 06/20/2017 05:22   [name] Head Wo Contrast  Result Date: 06/20/2017 CLINICAL DATA:  Diabetes, hypertension and hyperlipidemia. Acute presentation with right hand weakness and numbness. Acute left MCA territory infarctions by MRI earlier today. EXAM: MRA HEAD WITHOUT CONTRAST TECHNIQUE: Angiographic images of the Circle of Willis were obtained using MRA technique without intravenous contrast. COMPARISON:  MRI same day FINDINGS: Both internal carotid arteries show antegrade flow. No siphon stenosis. The anterior and middle cerebral vessels are patent. No missing branch vessels identifiable. Distal vessels do show some irregularity, particularly on the left. Both vertebral arteries are patent with the left being dominant. No basilar stenosis, though there is mild irregularity. Posterior circulation branch vessels are patent. Mild atherosclerotic irregularity of the more distal PCA branches right more than left. IMPRESSION: No large or medium vessel occlusion or correctable proximal stenosis identified. Some narrowing and irregularity of the more distal intracranial branch vessels. Electronically Signed   By: Nelson Chimes M.D.   On: 06/20/2017 11:25   Vascular Ultrasound Carotid  Duplex (Doppler) has been completed.  Findings suggest 1-39% internal carotid artery stenosis bilaterally. Vertebral arteries are patent with antegrade flow  Echo has been done however results are pending  LDL pending, A1c is 7.3 however patient does state that he has been working with this in changing medications recently  Assessment and plan discussed with with attending physician and they are in agreement.    Etta Quill PA-C [facility] [phone]  06/21/2017, 12:42 PM   Attending addendum  Patient seen and examined. I have independently examined the patient, obtaining history and obtain the review of systems. I agree with the history and physical documented above. My assessment and recommendations are below.  Assessment:  76 year old man with multiple cardiovascular risk factors presents to the hospital with sudden onset of right hand and leg numbness and weakness. He was evaluated with a MRI of the brain that showed multiple embolic looking areas of restricted diffusion in his left posterior insula  and left superior parietal cortex, possibly from a thrombus in a MCA branch that propagated. At the current time, his NIH stroke scale is 0. Most of his stroke workup has been completed and document above. His A1c is 7.3 His LDL is pending. I have independently reviewed the images. MRI showed strokes as mentioned above. MRA of the head shows possible occlusion of distal MCA branch on the left. Also diffuse narrowing of some distal branches of the left MCA. Carotid dopplers do not reveal significant stenosis of b/l carotids.  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 Right hemiparesis - resolved.  Recommendations -2-D echo -If that is unremarkable, transesophageal echogram. -He might need a loop recorder for looking for an embolic source-atrial fibrillation. -Continue him on antiplatelet and statin therapy. -PT OT and speech  therapy -Allow permissive hypertension for 24-48 hours and then normalize his blood pressures with a goal less than 140/90. -Telemetry Stroke team will follow in the morning. Please call us with questions   Amie Portland, MD [facility] [phone]  If 7pm to 7am, please call on call as listed on AMION.

## 2017-06-22 ENCOUNTER — Telehealth: Payer: Self-pay | Admitting: Endocrinology

## 2017-06-22 DIAGNOSIS — I6300 Cerebral infarction due to thrombosis of unspecified precerebral artery: Secondary | ICD-10-CM

## 2017-06-22 LAB — CBC WITH DIFFERENTIAL/PLATELET
Basophils Absolute: 0 10*3/uL (ref 0.0–0.1)
Basophils Relative: 0 %
EOS ABS: 0.3 10*3/uL (ref 0.0–0.7)
Eosinophils Relative: 5 %
HCT: 38.7 % — ABNORMAL LOW (ref 39.0–52.0)
Hemoglobin: 12.8 g/dL — ABNORMAL LOW (ref 13.0–17.0)
LYMPHS ABS: 2.3 10*3/uL (ref 0.7–4.0)
Lymphocytes Relative: 39 %
MCH: 29.2 pg (ref 26.0–34.0)
MCHC: 33.1 g/dL (ref 30.0–36.0)
MCV: 88.2 fL (ref 78.0–100.0)
MONO ABS: 0.7 10*3/uL (ref 0.1–1.0)
MONOS PCT: 12 %
Neutro Abs: 2.6 10*3/uL (ref 1.7–7.7)
Neutrophils Relative %: 44 %
Platelets: 174 10*3/uL (ref 150–400)
RBC: 4.39 MIL/uL (ref 4.22–5.81)
RDW: 14.4 % (ref 11.5–15.5)
WBC: 5.9 10*3/uL (ref 4.0–10.5)

## 2017-06-22 LAB — BASIC METABOLIC PANEL
Anion gap: 6 (ref 5–15)
BUN: 22 mg/dL — ABNORMAL HIGH (ref 6–20)
CHLORIDE: 108 mmol/L (ref 101–111)
CO2: 25 mmol/L (ref 22–32)
CREATININE: 1.04 mg/dL (ref 0.61–1.24)
Calcium: 8.9 mg/dL (ref 8.9–10.3)
GFR calc Af Amer: >60 mL/min (ref >60)
GFR calc non Af Amer: >60 mL/min (ref >60)
GLUCOSE: 150 mg/dL — AB (ref 65–99)
Potassium: 4.1 mmol/L (ref 3.5–5.1)
SODIUM: 139 mmol/L (ref 135–145)

## 2017-06-22 LAB — LIPID PANEL
CHOL/HDL RATIO: 4.9 ratio
Cholesterol: 113 mg/dL (ref 0–200)
HDL: 23 mg/dL — AB (ref >40)
LDL Cholesterol: 56 mg/dL (ref 0–99)
TRIGLYCERIDES: 172 mg/dL — AB (ref <150)
VLDL: 34 mg/dL (ref 0–40)

## 2017-06-22 LAB — GLUCOSE, CAPILLARY
GLUCOSE-CAPILLARY: 145 mg/dL — AB (ref 65–99)
GLUCOSE-CAPILLARY: 168 mg/dL — AB (ref 65–99)
Glucose-Capillary: 187 mg/dL — ABNORMAL HIGH (ref 65–99)
Glucose-Capillary: 170 mg/dL — ABNORMAL HIGH (ref 65–99)

## 2017-06-22 MED ORDER — HYDROCHLOROTHIAZIDE 25 MG PO TABS: 25.0000 mg | ORAL_TABLET | Freq: Every day | ORAL | Status: DC

## 2017-06-22 MED ORDER — LOSARTAN POTASSIUM 50 MG PO TABS
100.0000 mg | ORAL_TABLET | Freq: Every day | ORAL | Status: DC
  Administered 2017-06-22 – 2017-06-23 (×2): 100 mg via ORAL
  Filled 2017-06-22 (×2): qty 2

## 2017-06-22 MED ORDER — LOSARTAN POTASSIUM-HCTZ 100-25 MG PO TABS: 1.0000 | ORAL_TABLET | Freq: Every day | ORAL | Status: DC

## 2017-06-22 MED ORDER — CLOPIDOGREL BISULFATE 75 MG PO TABS
75.0000 mg | ORAL_TABLET | Freq: Every day | ORAL | Status: DC
  Administered 2017-06-22 – 2017-06-23 (×2): 75 mg via ORAL
  Filled 2017-06-22 (×2): qty 1

## 2017-06-22 NOTE — Telephone Encounter (Signed)
Routing to you °

## 2017-06-22 NOTE — Progress Notes (Signed)
PROGRESS NOTE    Jake Gross  ZOX:[id number] DOB: October 20, 1940 DOA: 06/19/2017 PCP: Jearld Fenton, NP    Brief Narrative:  Patient is a 76 year old gentleman history of diabetes, hypertension, hyperlipidemia, arthritis presented to the ED with complaints of right hand weakness numbness and tingling. MRI consistent with an acute stroke. Patient admitted for stroke workup. Consult neurology.   Assessment & Plan:   Principal Problem:   Stroke [facility]) Active Problems:   Diabetes mellitus type 2, insulin dependent ([place])   Hypertension   HLD (hyperlipidemia)   #1 acute CVA MRI brain consistent with subcentimeter foci of acute/early subacute infarct within left posterior insula and left superior parietal cortex extending in the subcortical white matter. Concern for cardio embolic etiology. Patient with clinical improvement states right hand weakness has improved. Carotid Dopplers with no significant ICA stenosis. 2-D echo with EF of 55-60%. No source of emboli. Hemoglobin A1c 7.3. LDL of 56 and at goal. Will start patient back on home regimen of Cozaar for better blood pressure control. Patient needs a transesophageal echocardiogram to rule out source of emboli. Continue full dose aspirin for secondary stroke prophylaxis. Continue home dose Crestor. PT/OT/ST. Neurology following.   #2 hypertension Blood pressure elevated. Patient has been under permissive hypertension since admission. Will resume patient's Cozaar. Continue to hold hydrochlorothiazide. Follow.   #3 hyperlipidemia Fasting lipid panel with LDL of 56. At goal. Continue home regimen Crestor.  #4 diabetes mellitus type 2 insulin-dependent Hemoglobin A1c 7.3. CBGs have ranged from 168-187. Continue sliding scale insulin.   DVT prophylaxis: Lovenox Code Status: Full Family Communication: Updated patient. No family at bedside. Disposition Plan: Likely home once stroke workup is completed and per neurology.   Consultants:    Neurology- Dr.Arora 06/21/2017  Procedures:   2-D echo 06/21/2017 pending  CT head 06/20/2017  Chest x-ray 06/20/2017  MRI/MRA head 06/20/2017  Carotid Dopplers 06/20/2017  2-D echo 06/21/2017--- EF 55-60%. No wall motion abnormalities. No cardiac source of emboli.  Antimicrobials:   None   Subjective: Patient states right hand weakness has improved. No chest pain. No shortness of breath. Tolerating current diet.   Objective: Vitals:   06/22/17 0810 06/22/17 1030 06/22/17 1218 06/22/17 1459  BP: (!) 186/54 (!) 186/63 (!) 160/65 (!) 150/61  Pulse: (!) 51 (!) 56 (!) 56 (!) 58  Resp: 18  16 18   Temp: 97.9 F (36.6 C)  97.8 F (36.6 C) 98.4 F (36.9 C)  TempSrc: Oral  Oral Oral  SpO2: 98% 99% 96% 96%  Weight:      Height:        Intake/Output Summary (Last 24 hours) at 06/22/17 1931 Last data filed at 06/22/17 1527  Gross per 24 hour  Intake              520 ml  Output                0 ml  Net              520 ml   Filed Weights   06/19/17 2201 06/20/17 0639  Weight: 95.3 kg (210 lb) 104.6 kg (230 lb 8 oz)    Examination:  General exam: NAD Respiratory system: Clear to auscultation. No wheezes, no crackles, no rhonchi. Respiratory effort normal. Cardiovascular system: S1 & S2 heard, RRR. No JVD, murmurs, rubs, gallops or clicks. No pedal edema. Gastrointestinal system: Abdomen is nondistended, soft and nontender. No organomegaly or masses felt. Normal bowel sounds heard. Central nervous system: Alert  and oriented. No focal neurological deficits. Extremities: Symmetric 5 x 5 power. Skin: No rashes, lesions or ulcers Psychiatry: Judgement and insight appear normal. Mood & affect appropriate.     Data Reviewed: I have personally reviewed following labs and imaging studies  CBC:  Recent Labs Lab 06/19/17 2305 06/19/17 2322 06/22/17 0516  WBC 8.1  --  5.9  NEUTROABS 5.8  --  2.6  HGB 13.2 12.6* 12.8*  HCT 40.0 37.0* 38.7*  MCV 88.3  --  88.2   PLT 219  --  829   Basic Metabolic Panel:  Recent Labs Lab 06/19/17 2305 06/19/17 2322 06/22/17 0516  NA 142 144 139  K 4.8 4.7 4.1  CL 111 111 108  CO2 24  --  25  GLUCOSE 89 85 150*  BUN 31* 31* 22*  CREATININE 1.22 1.10 1.04  CALCIUM 9.0  --  8.9   GFR: Estimated Creatinine Clearance: 71.8 mL/min (by C-G formula based on SCr of 1.04 mg/dL). Liver Function Tests:  Recent Labs Lab 06/19/17 2305  AST 36  ALT 27  ALKPHOS 66  BILITOT 0.6  PROT 6.6  ALBUMIN 2.9*   No results for input(s): LIPASE, AMYLASE in the last 168 hours. No results for input(s): AMMONIA in the last 168 hours. Coagulation Profile:  Recent Labs Lab 06/19/17 2305  INR 1.02   Cardiac Enzymes: No results for input(s): CKTOTAL, CKMB, CKMBINDEX, TROPONINI in the last 168 hours. BNP (last 3 results) No results for input(s): PROBNP in the last 8760 hours. HbA1C:  Recent Labs  06/20/17 0749  HGBA1C 7.3*   CBG:  Recent Labs Lab 06/21/17 1745 06/21/17 2059 06/22/17 0735 06/22/17 1225 06/22/17 1728  GLUCAP 182* 210* 145* 168* 187*   Lipid Profile:  Recent Labs  06/22/17 0516  CHOL 113  HDL 23*  LDLCALC 56  TRIG 172*  CHOLHDL 4.9   Thyroid Function Tests: No results for input(s): TSH, T4TOTAL, FREET4, T3FREE, THYROIDAB in the last 72 hours. Anemia Panel: No results for input(s): VITAMINB12, FOLATE, FERRITIN, TIBC, IRON, RETICCTPCT in the last 72 hours. Sepsis Labs: No results for input(s): PROCALCITON, LATICACIDVEN in the last 168 hours.  No results found for this or any previous visit (from the past 240 hour(s)).       Radiology Studies: No results found.      Scheduled Meds: . clopidogrel  75 mg Oral Daily  . enoxaparin (LOVENOX) injection  40 mg Subcutaneous Q24H  . folic acid  1 mg Oral q morning - 10a  . insulin aspart  0-5 Units Subcutaneous QHS  . insulin aspart  0-9 Units Subcutaneous TID WC  . losartan  100 mg Oral Daily  . methotrexate  20 mg Oral Q  Fri  . rosuvastatin  20 mg Oral QHS   Continuous Infusions:    LOS: 2 days    Time spent: 42 minutes    THOMPSON,DANIEL, MD [facility] Pager [phone]  If 7PM-7AM, please contact night-coverage [email or web address] Password [facility] 06/22/2017, 7:31 PM

## 2017-06-22 NOTE — Telephone Encounter (Signed)
Called & spoke with Larene Beach stating patient should stay on dosage he was given at hospital. Patient will also be told to call back to make an appt. With in next 2 weeks. I will call him Monday also to try to set this up.

## 2017-06-22 NOTE — Progress Notes (Signed)
Occupational Therapy Treatment Patient Details Name: Jake Gross MRN: [id number] DOB: 1941-10-12 Today's Date: 06/22/2017    History of present illness Jake Gross is a 76 y.o. male with medical history significant for diabetes, hypertension, hyperlipidemia, arthritis presents to the emergency department with chief complaint right hand weakness/numbness tingling. Initial evaluation includes an MRI revealing Subcentimeter foci of acute/early subacute infarct within left posterior insula and the left superior parietal cortex extending in the subcortical white matter.   OT comments  Pt making progress towards OT goals this session. Focused on establishing HEP with yellow (soft) theraputty with handout provided. Pt demonstrated understanding, verbalized really enjoying and feeling like it gave him "purpose" and "something to work for" in his recovery. "I like to be busy, and working". OT will continue to follow acutely.  Continue to feel Pt strongly needs OPOT at neuro center to maximize function, sensation, and dexterity in R hand.    Follow Up Recommendations  Outpatient OT;Supervision/Assistance - 24 hour ([facility])    Equipment Recommendations  3 in 1 bedside commode    Recommendations for Other Services      Precautions / Restrictions Precautions Precautions: Fall Restrictions Weight Bearing Restrictions: No       Mobility Bed Mobility               General bed mobility comments: Pt OOB in recliner when OT entered  Transfers                 General transfer comment: deferred this session    Balance                                           ADL either performed or assessed with clinical judgement   ADL                                         General ADL Comments: focused on HEP with theraputty this session     Vision       Perception     Praxis      Cognition Arousal/Alertness:  Awake/alert Behavior During Therapy: WFL for tasks assessed/performed Overall Cognitive Status: Within Functional Limits for tasks assessed                                          Exercises Exercises: Other exercises Other Exercises Other Exercises: digit extension/flexion in R hand Other Exercises: theraputty handout provided with yellow (soft) putty   Shoulder Instructions       General Comments      Pertinent Vitals/ Pain       Pain Assessment: No/denies pain  Home Living                                          Prior Functioning/Environment              Frequency  Min 3X/week        Progress Toward Goals  OT Goals(current goals can now be found in the care plan section)  Progress towards OT goals: Progressing toward goals  Acute Rehab OT Goals Patient Stated Goal: to get better OT Goal Formulation: With patient Time For Goal Achievement: 07/04/17 Potential to Achieve Goals: Good  Plan Discharge plan remains appropriate    Co-evaluation                 AM-PAC PT "6 Clicks" Daily Activity     Outcome Measure   Help from another person eating meals?: A Little Help from another person taking care of personal grooming?: A Little Help from another person toileting, which includes using toliet, bedpan, or urinal?: A Little Help from another person bathing (including washing, rinsing, drying)?: A Little Help from another person to put on and taking off regular upper body clothing?: A Little Help from another person to put on and taking off regular lower body clothing?: A Little 6 Click Score: 18    End of Session    OT Visit Diagnosis: Unsteadiness on feet (R26.81);Hemiplegia and hemiparesis Hemiplegia - Right/Left: Right Hemiplegia - dominant/non-dominant: Dominant Hemiplegia - caused by: Cerebral infarction   Activity Tolerance Patient tolerated treatment well   Patient Left in chair;with call bell/phone  within reach   Nurse Communication Mobility status        Time: 9242-6834 OT Time Calculation (min): 18 min  Charges: OT General Charges $OT Visit: 1 Visit OT Treatments $Therapeutic Exercise: 8-22 mins Hulda Humphrey OTR/L [place] 06/22/2017, 5:02 PM

## 2017-06-22 NOTE — Telephone Encounter (Signed)
Please d/c pt home on inpt dosage. Please come back for a follow-up appointment in 1-2 weeks

## 2017-06-22 NOTE — Consult Note (Addendum)
   [facility] CM Inpatient Consult   06/22/2017  Jake Gross May 23, 1941 [id number]     [facility] Care Management follow up.   Acknowledged [facility] Care Management referral. Spoke with DM Coordinator to discuss referral. Made her aware that her concerns for DM management will be passed along to [place] with [place] program.  Chart reviewed and noted PT recommendation change from previous recommendations. Discussed with inpatient RNCM who indicates that [name] can still benefit from [place] program. He is a high fall risk and high risk for readmission.  Spoke with [name] at bedside to obtain written consent for [name] Management in case their is a pharmacy or transportation need while he is enrolled with [facility] First program. Of note, spoke with [name] via phone.  [name] states he needs all the help he can receive. He states he was always independent. He reports "now  I feel different and it makes me nervous". He indicates he is supposed to have more testing done.   Provided [facility] Care Management packet and contact information.  [name] expressed appreciation of visit.  Collaborated and discussed all of the above with inpatient RNCM, [place] Liaison and RN, DM Inpatient Coordinator. Also asked [place]wina with [facility] First to please loop [name] back to [place] Management for DM management upon discharge from [place], MSN-Ed, RN,BSN [facility] Liaison [phone]

## 2017-06-22 NOTE — Progress Notes (Signed)
STROKE TEAM PROGRESS NOTE   HISTORY OF PRESENT ILLNESS (per record) Jake Gross is an 76 y.o. male with hypertension, hyperlipidemia, RA, and diabetes presenting by [facility] to the ED for sudden-onset R-sided weakness.  He attempted to stand up and fell.   MRI showed a left parietal stroke. Patient was admitted and stroke workup was initiated.  Date last known well: Date: 06/20/2017 Time last known well: Time: 13:00 NIHSS stroke scale of 0  Patient was not administered IV t-PA secondary to arriving outside of the tPA treatment window. He was admitted to General Neurology for further evaluation and treatment.   SUBJECTIVE (INTERVAL HISTORY) No family is at the bedside.  The patient is awake, alert, and follows all commands appropriately.  The pt states he has been taking two baby aspirins daily prior to this stroke occurring, but cannot recall why he takes them.  Outpt TEE and 30-day event monitor requested from the Cards Master.   OBJECTIVE Temp:  [97.7 F (36.5 C)-98.5 F (36.9 C)] 97.9 F (36.6 C) (09/07 0810) Pulse Rate:  [50-122] 51 (09/07 0810) Cardiac Rhythm: Normal sinus rhythm;Heart block (09/06 1935) Resp:  [18] 18 (09/07 0810) BP: (165-195)/(45-73) 186/54 (09/07 0810) SpO2:  [83 %-98 %] 98 % (09/07 0810)  CBC:  Recent Labs Lab 06/19/17 2305 06/19/17 2322 06/22/17 0516  WBC 8.1  --  5.9  NEUTROABS 5.8  --  2.6  HGB 13.2 12.6* 12.8*  HCT 40.0 37.0* 38.7*  MCV 88.3  --  88.2  PLT 219  --  196    Basic Metabolic Panel:  Recent Labs Lab 06/19/17 2305 06/19/17 2322 06/22/17 0516  NA 142 144 139  K 4.8 4.7 4.1  CL 111 111 108  CO2 24  --  25  GLUCOSE 89 85 150*  BUN 31* 31* 22*  CREATININE 1.22 1.10 1.04  CALCIUM 9.0  --  8.9    Lipid Panel:     Component Value Date/Time   CHOL 113 06/22/2017 0516   TRIG 172 (H) 06/22/2017 0516   HDL 23 (L) 06/22/2017 0516   CHOLHDL 4.9 06/22/2017 0516   VLDL 34 06/22/2017 0516   LDLCALC 56 06/22/2017 0516    HgbA1c: Lab Results  Component Value Date   HGBA1C 7.3 (H) 06/20/2017   Urine Drug Screen:   Component Value Date/Time   LABOPIA NONE DETECTED 06/20/2017 0138   COCAINSCRNUR NONE DETECTED 06/20/2017 0138   LABBENZ NONE DETECTED 06/20/2017 0138   AMPHETMU NONE DETECTED 06/20/2017 0138   THCU NONE DETECTED 06/20/2017 0138   LABBARB NONE DETECTED 06/20/2017 0138    Alcohol Level    Component Value Date/Time   ETH <5 06/20/2017 0432    IMAGING  CT Head Wo Contrast 06/20/2017 IMPRESSION: 1. No acute intracranial abnormality identified. 2. Mild chronic microvascular ischemic changes and mild parenchymal volume loss of the brain.  Dg Chest 1 View 06/20/2017 IMPRESSION: 1. Interval mild cardiomegaly. 2. Interval mild chronic interstitial lung disease.  MRI brain 06/20/2017 1. Subcentimeter foci of acute/early subacute infarct within left posterior insula and the left superior parietal cortex extending in the subcortical white matter. No acute hemorrhage. 2. Mild for age chronic microvascular ischemic changes and mild parenchymal volume loss of the brain. No abnormal enhancement of the brain.  Mr Jake Gross Head Wo Contrast 06/20/2017 IMPRESSION: No large or medium vessel occlusion or correctable proximal stenosis identified. Some narrowing and irregularity of the more distal intracranial branch vessels.  TTE 06/21/2017 Study Conclusions - Left ventricle: The  cavity size was normal. Wall thickness was increased increased in a pattern of mild to moderate LVH. Systolic function was normal. The estimated ejection fraction was in the range of 55% to 60%. Wall motion was normal; there were no regional wall motion abnormalities. - Mitral valve: Mildly calcified annulus. There was mild regurgitation. - Left atrium: The atrium was mildly dilated. Impressions: - No cardiac source of emboli was indentified.  Carotid US 06/20/2017 B ICA 1-39% stenosis, VAs antegrade   PHYSICAL EXAM Pleasant  elderly Caucasian male not in distress. . Afebrile. Head is nontraumatic. Neck is supple without bruit.    Cardiac exam no murmur or gallop. Lungs are clear to auscultation. Distal pulses are well felt. Neurological Exam :    Awake  Alert oriented x 3. Normal speech and language.eye movements full without nystagmus.fundi were not visualized. Vision acuity and fields appear normal. Hearing is normal. Palatal movements are normal. Face symmetric. Tongue midline. Normal strength, tone, reflexes and coordination. Normal sensation. Gait deferred.  Left brain embolic infarcts within left posterior insula and the left superior parietal cortex extending in the subcortical white matter,    Resultant  No deficits  CT head: no acute stroke  MRI head:  Subcentimeter foci of acute/early subacute infarct within left posterior insula and the left superior parietal cortex extending in the subcortical white matter.  MRA head: No LO or high-grade stenosis  Carotid Doppler: B ICA 1-39% stenosis, VAs antegrade  2D Echo: EF 55-60%. No source of embolus   LDL 56  HgbA1c 7.6  Lovenox 40 mg sq daily for VTE prophylaxis Diet heart healthy/carb modified Room service appropriate? Yes; Fluid consistency: Thin  Aspirin 162 mg (pt reported) prior to admission, now on clopidogrel 75 mg daily  Patient counseled to be compliant withhis antithrombotic medications  Ongoing aggressive stroke risk factor management  Therapy recommendations:  Outpatient OT;Supervision/Assistance - 24 hour ([facility])   Disposition: pending  Hypertension  Unstable  Permissive hypertension (OK if < 220/120) but gradually normalize in 5-7 days  Long-term BP goal normotensive  Hyperlipidemia  Home meds: rosuvastatin 20 mg PO daily, resumed in hospital  LDL 56, goal < 70  Continue statin at discharge  Diabetes  HgbA1c 7.6, goal < 7.0  Uncontrolled  Other Stroke Risk Factors  Advanced age  Obesity,Body  mass index is 36.1 kg/m., recommend weight loss, diet and exercise as appropriate   Family hx stroke (mother)  Other Active Problems  None  Hospital day #2  I have personally examined this patient, reviewed notes, independently viewed imaging studies, participated in medical decision making and plan of care.ROS completed by me personally and pertinent positives fully documented  I have made any additions or clarifications directly to the above note.  He presented with right sided weakness do you to embolic left brain MCA branch infarcts etiology to be determined . Recommend outpatient TEE and cardiac monitoring. Change aspirin to Plavix for stroke  Aggressive risk factor modification. Followup as an outpatient in the stroke clinic  in 6 weeks. Greater than 50% time during this 25 minute visit was spent on counseling and coordination of care about his embolic stroke and discussion about evaluation, prevention and treatment. Discussed with Dr. Hyman Bower, MD Medical Director [place] Pager: [phone] 06/22/2017 10:44 PM   To contact Stroke Continuity provider, please refer to [email or web address] After hours, contact General Neurology

## 2017-06-22 NOTE — Telephone Encounter (Signed)
Larene Beach from [facility] called in reference to patient being discharged from hospital. The doctor would like to know if the patient needs to continue the amounts of insulin he has been doing in the hospital because his sugars have been in the 100s per [facility]. Patient has been on limited amount of insulin while in the hospital and doctor would like to know if this needs to continue in order for patient to be discharged safely at home. Please call Larene Beach back at number provided and advise.

## 2017-06-23 DIAGNOSIS — I638 Other cerebral infarction: Secondary | ICD-10-CM

## 2017-06-23 LAB — GLUCOSE, CAPILLARY
Glucose-Capillary: 189 mg/dL — ABNORMAL HIGH (ref 65–99)
Glucose-Capillary: 201 mg/dL — ABNORMAL HIGH (ref 65–99)

## 2017-06-23 MED ORDER — INSULIN LISPRO 100 UNIT/ML (KWIKPEN): PEN_INJECTOR | SUBCUTANEOUS | 0 refills | Status: DC

## 2017-06-23 MED ORDER — CLOPIDOGREL BISULFATE 75 MG PO TABS
75.0000 mg | ORAL_TABLET | Freq: Every day | ORAL | 1 refills | Status: DC
Start: 2017-06-24 — End: 2017-08-22

## 2017-06-23 NOTE — Progress Notes (Addendum)
STROKE TEAM PROGRESS NOTE   HISTORY OF PRESENT ILLNESS (per record) Jake Gross is an 76 y.o. male with hypertension, hyperlipidemia, RA, and diabetes presenting by [facility] to the ED for sudden-onset R-sided weakness.  He attempted to stand up and fell.  MRI showed a left parietal stroke. Patient was admitted and stroke workup was initiated.  Date last known well: Date: 06/20/2017 Time last known well: Time: 13:00 NIHSS stroke scale of 0  Patient was not administered IV t-PA secondary to arriving outside of the tPA treatment window. He was admitted to General Neurology for further evaluation and treatment.    SUBJECTIVE (INTERVAL HISTORY) No family at bedside. Patient stated that his right hand numbness and right-sided back weakness all resolved. He would like to go home today. We will set up outpatient TEE and loop recorder.   OBJECTIVE Temp:  [97.7 F (36.5 C)-98.5 F (36.9 C)] 97.9 F (36.6 C) (09/07 0810) Pulse Rate:  [50-122] 51 (09/07 0810) Cardiac Rhythm: Normal sinus rhythm;Heart block (09/06 1935) Resp:  [18] 18 (09/07 0810) BP: (165-195)/(45-73) 186/54 (09/07 0810) SpO2:  [83 %-98 %] 98 % (09/07 0810)  CBC:  Recent Labs Lab 06/19/17 2305 06/19/17 2322 06/22/17 0516  WBC 8.1  --  5.9  NEUTROABS 5.8  --  2.6  HGB 13.2 12.6* 12.8*  HCT 40.0 37.0* 38.7*  MCV 88.3  --  88.2  PLT 219  --  270    Basic Metabolic Panel:  Recent Labs Lab 06/19/17 2305 06/19/17 2322 06/22/17 0516  NA 142 144 139  K 4.8 4.7 4.1  CL 111 111 108  CO2 24  --  25  GLUCOSE 89 85 150*  BUN 31* 31* 22*  CREATININE 1.22 1.10 1.04  CALCIUM 9.0  --  8.9    Lipid Panel:     Component Value Date/Time   CHOL 113 06/22/2017 0516   TRIG 172 (H) 06/22/2017 0516   HDL 23 (L) 06/22/2017 0516   CHOLHDL 4.9 06/22/2017 0516   VLDL 34 06/22/2017 0516   LDLCALC 56 06/22/2017 0516   HgbA1c: Lab Results  Component Value Date   HGBA1C 7.3 (H) 06/20/2017   Urine Drug Screen:   Component  Value Date/Time   LABOPIA NONE DETECTED 06/20/2017 0138   COCAINSCRNUR NONE DETECTED 06/20/2017 0138   LABBENZ NONE DETECTED 06/20/2017 0138   AMPHETMU NONE DETECTED 06/20/2017 0138   THCU NONE DETECTED 06/20/2017 0138   LABBARB NONE DETECTED 06/20/2017 0138    Alcohol Level    Component Value Date/Time   ETH <5 06/20/2017 0432      IMAGING I have personally reviewed the radiological images below and agree with the radiology interpretations.  CT Head Wo Contrast 06/20/2017 IMPRESSION: 1. No acute intracranial abnormality identified. 2. Mild chronic microvascular ischemic changes and mild parenchymal volume loss of the brain.  Dg Chest 1 View 06/20/2017 IMPRESSION:  1. Interval mild cardiomegaly.  2. Interval mild chronic interstitial lung disease.  MRI brain 06/20/2017 IMPRESSION:  1. Subcentimeter foci of acute/early subacute infarct within left posterior insula and the left superior parietal cortex extending in the subcortical white matter. No acute hemorrhage. 2. Mild for age chronic microvascular ischemic changes and mild parenchymal volume loss of the brain. No abnormal enhancement of the brain.  [name] Head Wo Contrast 06/20/2017 IMPRESSION:  No large or medium vessel occlusion or correctable proximal stenosis identified. Some narrowing and irregularity of the more distal intracranial branch vessels.  TTE 06/21/2017 Study Conclusions - Left ventricle:  The cavity size was normal. Wall thickness was increased increased in a pattern of mild to moderate LVH. Systolic function was normal. The estimated ejection fraction was in the range of 55% to 60%. Wall motion was normal; there were no regional wall motion abnormalities. - Mitral valve: Mildly calcified annulus. There was mild regurgitation. - Left atrium: The atrium was mildly dilated. Impressions: - No cardiac source of emboli was indentified.  Carotid US 06/20/2017 B ICA 1-39% stenosis, VAs antegrade   PHYSICAL  EXAM Vitals:   06/22/17 2147 06/23/17 0216 06/23/17 0509 06/23/17 0945  BP: (!) 188/67 (!) 158/87 (!) 153/74 139/71  Pulse: (!) 55 (!) 53 (!) 57 (!) 59  Resp: 17 17 16 17   Temp: 97.9 F (36.6 C) 98 F (36.7 C) 97.6 F (36.4 C) 98 F (36.7 C)  TempSrc: Oral Oral Oral   SpO2: 95% 95% 95% 98%  Weight:      Height:        Pleasant elderly Caucasian male not in distress. . Afebrile. Head is nontraumatic. Neck is supple without bruit.    Cardiac exam no murmur or gallop. Lungs are clear to auscultation. Distal pulses are well felt.  Neurological Exam :  Awake  Alert oriented x 3. Normal speech and language.eye movements full without nystagmus.fundi were not visualized. Vision acuity and fields appear normal. Hearing is normal. Palatal movements are normal. Face symmetric. Tongue midline. Normal strength, tone, reflexes and coordination. Normal sensation. Gait deferred.    ASSESSMENT/PLAN:  Stroke: Left posterior insula and the left superior parietal cortex extending in the subcortical white matter, embolic pattern, source unclear  Resultant  No neuro deficits  CT head: no acute stroke  MRI head:  Subcentimeter foci of acute/early subacute infarct within left posterior insula and the left superior parietal cortex extending in the subcortical white matter.  MRA head: No LO or high-grade stenosis  Carotid Doppler: B ICA 1-39% stenosis, VAs antegrade  2D Echo: EF 55-60%. No source of embolus  Recommend outpatient TEE and loop recorder to rule out cardiac source of emboli and to rule out A. fib  LDL 56  HgbA1c 7.6  Lovenox 40 mg sq daily for VTE prophylaxis Diet heart healthy/carb modified Room service appropriate? Yes; Fluid consistency: Thin  Aspirin 162 mg (pt reported) prior to admission, now on clopidogrel 75 mg daily. Continue Plavix on discharge  Patient counseled to be compliant with his antithrombotic medications  Ongoing aggressive stroke risk factor  management  Therapy recommendations:  Outpatient OT;Supervision/Assistance - 24 hour ([facility])   Disposition: Pending  Hypertension  Unstable  Permissive hypertension (OK if < 220/120) but gradually normalize in 5-7 days  Long-term BP goal normotensive  Hyperlipidemia  Home meds: rosuvastatin 20 mg PO daily, resumed in hospital  LDL 56, goal < 70  Continue statin at discharge  Diabetes  HgbA1c 7.6, goal < 7.0  Uncontrolled  SSI   close PCP follow-up for better DM measurement  Other Stroke Risk Factors  Advanced age  Obesity, body mass index is 36.1 kg/m., recommend weight loss, diet and exercise as appropriate   Family hx stroke (mother)  Other [facility] day #2  Neurology will sign off. Please call with questions. Pt will follow up with Dr. Leonie Man at [facility] in about 6 weeks. Thanks for the consult.  Rosalin Hawking, MD PhD Stroke Neurology 06/23/2017 12:25 PM    To contact Stroke Continuity provider, please refer to [email or web address] After hours, contact General  Neurology

## 2017-06-23 NOTE — Progress Notes (Signed)
Pt d/c'd per md orders. Pts iv was removed and site was benign. Pt stable leaving the floor. Pt given AVS, questions were answered. Pt left via wheelchair to private vehicle.

## 2017-06-23 NOTE — Discharge Summary (Signed)
Physician Discharge Summary  Jake Gross ZPH:[id number] DOB: January 22, 1941 DOA: 06/19/2017  PCP: Jearld Fenton, NP  Admit date: 06/19/2017 Discharge date: 06/23/2017  Time spent: 65 minutes  Recommendations for Outpatient Follow-up:  1. Follow-up with Dr. Leonie Man, neurology in 6 weeks. 2. Follow-up with Jearld Fenton, NP in 1-2 weeks. On follow-up patient blood pressure need to be reassessed as patient was only discharged home on his Cozaar and HCTZ discontinued. Patient will also need a basic metabolic profile done to follow-up on electrolytes and renal function. 3. Follow-up with Dr. Renato Shin, endocrinology in 1-2 weeks. On follow-up patient's CBGs/diabetes and need to be reassessed. Patient's CBGs were well controlled during the hospitalization on sliding scale insulin only. Patient was subsequently discharged home on Humalog meal coverage insulin 3 units with meals. Outpatient follow-up for further diabetes management.   Discharge Diagnoses:  Principal Problem:   Stroke [facility]) Active Problems:   Diabetes mellitus type 2, insulin dependent ([place])   Hypertension   HLD (hyperlipidemia)   Discharge Condition: Stable and improved  Diet recommendation: Carb modified  Filed Weights   06/19/17 2201 06/20/17 0639  Weight: 95.3 kg (210 lb) 104.6 kg (230 lb 8 oz)    History of present illness:  Per Dr. Letitia Caul Balboni is a very pleasant 76 y.o. male with medical history significant for diabetes, hypertension, hyperlipidemia, arthritis presented to the emergency department with chief complaint right hand weakness/numbness tingling. Initial evaluation included an MRI revealing stroke  Information was obtained from the patient. He stated he was in his usual state of health until 1 day prior to admission, he suffered a fall. He reported he did not note any right leg weakness but was unable to explain his fall. He denied hitting his head or losing consciousness. He denied having  trouble getting up or any other injury. Shortly thereafter he developed numbness tingling in his right hand. He also noted that he was "unable to pick up a fork". He denied headache visual disturbances chest pain palpitation shortness of breath. He denied any difficulty speaking chewing or swallowing. His reported his insulin regimen was changed recently from Lantus 90 units twice a day to Lantus 80 units daily at bedtime and Humalog 110 units in the a.m. and in the afternoon. He denied abdominal pain nausea vomiting diarrhea constipation melena bright red blood per rectum. He denied dysuria hematuria frequency or urgency.  Hospital Course:  #1 acute CVA MRI brain consistent with subcentimeter foci of acute/early subacute infarct within left posterior insula and left superior parietal cortex extending in the subcortical white matter. Concern for cardio embolic etiology. Patient with clinical improvement stated right hand weakness had improved.  Carotid Dopplers with no significant ICA stenosis.  2-D echo with EF of 55-60%. No source of emboli. Hemoglobin A1c 7.3. LDL of 56 and at goal. Patient's antihypertensive medications were initially held for permissive hypertension in patient's goals and subsequently resumed during the hospitalization however patient's HCTZ was held. Patient was initially placed on a full dose aspirin and stroke team followed the patient throughout the hospitalization. Patient's aspirin was subsequently substituted and patient placed on Plavix for secondary stroke prevention. Neurology recommendations. Patient needs a transesophageal echocardiogram to rule out source of emboli which will be done in the outpatient setting. Patient was maintained on a statin. Patient was seen by PT/OT plus AST. Patient will follow-up with neurology 6 weeks post discharge in the stroke clinic.  #2 hypertension Blood pressure elevated. Patient has been under permissive hypertension  since admission  secondary to acute stroke. Patient's: I was resumed and HCTZ held. Outpatient follow-up with PCP.  #3 hyperlipidemia Fasting lipid panel with LDL of 56. At goal. Continued on home regimen Crestor.  #4 diabetes mellitus type 2 insulin-dependent Hemoglobin A1c 7.3. Patient's CBGs well-controlled between 100-200 during the hospitalization of his home dose insulin. Patient ontelemetry was discontinued as patient was noted to have some low CBGs. Patient was maintained on a sliding scale insulin during the hospitalization with well-controlled blood glucose levels. Patient be discharged home on Humalog meal coverage insulin 3 units with each meal until follow-up with his endocrinologist. Outpatient follow-up with his endocrinologist.    Procedures:  2-D echo 06/21/2017 pending  CT head 06/20/2017  Chest x-ray 06/20/2017  MRI/MRA head 06/20/2017  Carotid Dopplers 06/20/2017  2-D echo 06/21/2017--- EF 55-60%. No wall motion abnormalities. No cardiac source of emboli.   Consultations:  Neurology- Dr.Arora 06/21/2017  Discharge Exam: Vitals:   06/23/17 0945 06/23/17 1339  BP: 139/71 (!) 158/61  Pulse: (!) 59 63  Resp: 17 18  Temp: 98 F (36.7 C) 98.2 F (36.8 C)  SpO2: 98% 98%    General: NAD Cardiovascular: RRR Respiratory: CTAB  Discharge Instructions   Discharge Instructions    Ambulatory referral to Cardiac Electrophysiology    Complete by:  As directed    Outpatient Tee and loop requested by Dr Erlinda Hong for embolic stroke.   Ambulatory referral to Neurology    Complete by:  As directed    Pt will follow up with Dr. Leonie Man at [facility] in about 2 months. Thanks.   Ambulatory referral to Occupational Therapy    Complete by:  As directed    Ambulatory referral to Occupational Therapy    Complete by:  As directed    MRI revealing Subcentimeter foci of acute/early subacute infarct within left posterior insula and the left superior parietal cortex extending in the subcortical  white matter.   Ambulatory referral to Physical Therapy    Complete by:  As directed    Ambulatory referral to Physical Therapy    Complete by:  As directed    MRI revealing Subcentimeter foci of acute/early subacute infarct within left posterior insula and the left superior parietal cortex extending in the subcortical white matter.   Diet Carb Modified    Complete by:  As directed    Increase activity slowly    Complete by:  As directed      Current Discharge Medication List    START taking these medications   Details  clopidogrel (PLAVIX) 75 MG tablet Take 1 tablet (75 mg total) by mouth daily. Qty: 30 tablet, Refills: 1      CONTINUE these medications which have CHANGED   Details  insulin lispro (HUMALOG KWIKPEN) 100 UNIT/ML KiwkPen 3 units 3 times daily with meals. Qty: 5 pen, Refills: 0      CONTINUE these medications which have NOT CHANGED   Details  folic acid (FOLVITE) 1 MG tablet Take 1 mg by mouth every morning.     losartan-hydrochlorothiazide (HYZAAR) 100-25 MG tablet TAKE 1 TABLET DAILY Qty: 90 tablet, Refills: 1   Associated Diagnoses: Essential hypertension    methotrexate (RHEUMATREX) 2.5 MG tablet Take 20 mg by mouth every Friday.    rosuvastatin (CRESTOR) 20 MG tablet Take 1 tablet (20 mg total) by mouth at bedtime. Qty: 90 tablet, Refills: 1    glucose blood (ONE TOUCH ULTRA TEST) test strip 1 each by Other route 2 (two) times  daily. And lancets 2/day Qty: 200 each, Refills: 3    Insulin Pen Needle 31G X 5 MM MISC 1 each by Does not apply route 3 (three) times daily as needed. Qty: 300 each, Refills: 4      STOP taking these medications     aspirin 81 MG tablet      Insulin Glargine (LANTUS SOLOSTAR) 100 UNIT/ML Solostar Pen        No Known Allergies Follow-up Information    Garvin Fila, MD Follow up in 6 week(s).   Specialties:  Neurology, Radiology Contact information: [address] [place]  [address] Dillard, [place], NP. Schedule an appointment as soon as possible for a visit in 1 week(s).   Specialty:  Internal Medicine Why:  f/u in 1-2 weeks. Contact information: [place] [address] [phone]        Renato Shin, MD. Schedule an appointment as soon as possible for a visit in 1 week(s).   Specialty:  Endocrinology Why:  f/u in 1-2 weeks. Contact information: [address] [place] [address] [phone]            The results of significant diagnostics from this hospitalization (including imaging, microbiology, ancillary and laboratory) are listed below for reference.    Significant Diagnostic Studies: Dg Chest 1 View  Result Date: 06/20/2017 CLINICAL DATA:  Stroke.  Ex-smoker. EXAM: CHEST 1 VIEW COMPARISON:  Chest CT dated 02/19/2015. FINDINGS: Poor inspiration. Interval mild enlargement of the cardiac silhouette, magnified by the poor inspiration and AP technique. Clear lungs with mild diffuse prominence of the interstitial markings. No pleural fluid. Thoracic spine degenerative changes. IMPRESSION: 1. Interval mild cardiomegaly. 2. Interval mild chronic interstitial lung disease. Electronically Signed   By: Claudie Revering M.D.   On: 06/20/2017 14:50   Ct Head Wo Contrast  Result Date: 06/20/2017 CLINICAL DATA:  76 y/o M; loss of feeling to the right arm, stroke suspected. EXAM: CT HEAD WITHOUT CONTRAST TECHNIQUE: Contiguous axial images were obtained from the base of the skull through the vertex without intravenous contrast. COMPARISON:  None. FINDINGS: Brain: No evidence of acute infarction, hemorrhage, hydrocephalus, extra-axial collection or mass lesion/mass effect. Mild brain parenchymal volume loss and chronic microvascular ischemic changes of the brain. Vascular: Calcific atherosclerosis of carotid siphons. No hyperdense vessel. Skull: Negative. Sinuses/Orbits: Tiny left sphenoid sinus mucous  retention cyst. Otherwise normal aeration of paranasal sinuses and mastoid air cells. Orbits are unremarkable. Other: Negative peer IMPRESSION: 1. No acute intracranial abnormality identified. 2. Mild chronic microvascular ischemic changes and mild parenchymal volume loss of the brain. Electronically Signed   By: Kristine Garbe M.D.   On: 06/20/2017 01:36   Mr Jeri Cos And Wo Contrast  Result Date: 06/20/2017 CLINICAL DATA:  76 y/o M; sudden onset of right forearm and hand numbness. EXAM: MRI HEAD WITHOUT AND WITH CONTRAST MRI CERVICAL SPINE WITHOUT AND WITH CONTRAST TECHNIQUE: Multiplanar, multiecho pulse sequences of the brain and surrounding structures, and cervical spine, to include the craniocervical junction and cervicothoracic junction, were obtained without and with intravenous contrast. CONTRAST:  66mL MULTIHANCE GADOBENATE DIMEGLUMINE 529 MG/ML IV SOLN COMPARISON:  06/20/2017 CT head. FINDINGS: MRI HEAD FINDINGS Brain: Subcentimeter focus of reduced diffusion within the left posterior insula cortex (series 3: Image 29). Additional subcentimeter focus within the left superior parietal cortex extending in the subcortical white matter (3:37). Mild chronic microvascular ischemic changes of  the brain and mild brain parenchymal volume loss. Right frontal subcortical white matter punctate focus of susceptibility hypointensity compatible with hemosiderin deposition of chronic microhemorrhage. No focal mass effect, hydrocephalus, or extra-axial collection. No abnormal enhancement. Vascular: Normal flow voids. Skull and upper cervical spine: Normal marrow signal. Sinuses/Orbits: Negative. Other: None. MRI CERVICAL SPINE FINDINGS Extensive motion artifact. Alignment: Straightening of cervical lordosis.  No listhesis. Vertebrae: No fracture, evidence of discitis, or bone lesion. No abnormal enhancement. Cord: Normal signal and morphology.  No abnormal enhancement. Posterior Fossa, vertebral arteries,  paraspinal tissues: Negative. Disc levels: C2-3: Left-sided disc osteophyte complex and facet hypertrophy with moderate left foraminal stenosis. No significant canal stenosis. C3-4: Disc osteophyte complex with bilateral uncovertebral and facet hypertrophy greater on the left. Mild right and moderate to severe left foraminal stenosis. Mild canal stenosis. C4-5: Disc osteophyte complex with bilateral uncovertebral and facet hypertrophy. Moderate to severe bilateral foraminal stenosis. Mild canal stenosis. C5-6: Disc osteophyte complex with bilateral uncovertebral and facet hypertrophy. Severe bilateral foraminal stenosis and moderate canal stenosis. C6-7: Disc osteophyte complex with bilateral uncovertebral and facet hypertrophy. Moderate bilateral foraminal stenosis and mild canal stenosis. C7-T1: Left-sided disc osteophyte complex and facet hypertrophy with mild left foraminal stenosis. No significant canal stenosis. IMPRESSION: MRI brain: 1. Subcentimeter foci of acute/early subacute infarct within left posterior insula and the left superior parietal cortex extending in the subcortical white matter. No acute hemorrhage. 2. Mild for age chronic microvascular ischemic changes and mild parenchymal volume loss of the brain. No abnormal enhancement of the brain. MRI cervical: 1. 2. No acute osseous abnormality or abnormal bone marrow signal. No abnormal cord signal or enhancement. 3. 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 These results were called by telephone at the time of interpretation on 06/20/2017 at 5:15 am to Dr. Vanita Panda, who verbally acknowledged these results. Electronically Signed   By: Kristine Garbe M.D.   On: 06/20/2017 05:22   Mr Cervical Spine W Or Wo Contrast  Result Date: 06/20/2017 CLINICAL DATA:  76 y/o M; sudden onset of right forearm and hand numbness. EXAM: MRI HEAD WITHOUT AND WITH CONTRAST MRI  CERVICAL SPINE WITHOUT AND WITH CONTRAST TECHNIQUE: Multiplanar, multiecho pulse sequences of the brain and surrounding structures, and cervical spine, to include the craniocervical junction and cervicothoracic junction, were obtained without and with intravenous contrast. CONTRAST:  64mL MULTIHANCE GADOBENATE DIMEGLUMINE 529 MG/ML IV SOLN COMPARISON:  06/20/2017 CT head. FINDINGS: MRI HEAD FINDINGS Brain: Subcentimeter focus of reduced diffusion within the left posterior insula cortex (series 3: Image 29). Additional subcentimeter focus within the left superior parietal cortex extending in the subcortical white matter (3:37). Mild chronic microvascular ischemic changes of the brain and mild brain parenchymal volume loss. Right frontal subcortical white matter punctate focus of susceptibility hypointensity compatible with hemosiderin deposition of chronic microhemorrhage. No focal mass effect, hydrocephalus, or extra-axial collection. No abnormal enhancement. Vascular: Normal flow voids. Skull and upper cervical spine: Normal marrow signal. Sinuses/Orbits: Negative. Other: None. MRI CERVICAL SPINE FINDINGS Extensive motion artifact. Alignment: Straightening of cervical lordosis.  No listhesis. Vertebrae: No fracture, evidence of discitis, or bone lesion. No abnormal enhancement. Cord: Normal signal and morphology.  No abnormal enhancement. Posterior Fossa, vertebral arteries, paraspinal tissues: Negative. Disc levels: C2-3: Left-sided disc osteophyte complex and facet hypertrophy with moderate left foraminal stenosis. No significant canal stenosis. C3-4: Disc osteophyte complex with bilateral uncovertebral and facet hypertrophy greater on the left. Mild right and moderate  to severe left foraminal stenosis. Mild canal stenosis. C4-5: Disc osteophyte complex with bilateral uncovertebral and facet hypertrophy. Moderate to severe bilateral foraminal stenosis. Mild canal stenosis. C5-6: Disc osteophyte complex with  bilateral uncovertebral and facet hypertrophy. Severe bilateral foraminal stenosis and moderate canal stenosis. C6-7: Disc osteophyte complex with bilateral uncovertebral and facet hypertrophy. Moderate bilateral foraminal stenosis and mild canal stenosis. C7-T1: Left-sided disc osteophyte complex and facet hypertrophy with mild left foraminal stenosis. No significant canal stenosis. IMPRESSION: MRI brain: 1. Subcentimeter foci of acute/early subacute infarct within left posterior insula and the left superior parietal cortex extending in the subcortical white matter. No acute hemorrhage. 2. Mild for age chronic microvascular ischemic changes and mild parenchymal volume loss of the brain. No abnormal enhancement of the brain. MRI cervical: 1. 2. No acute osseous abnormality or abnormal bone marrow signal. No abnormal cord signal or enhancement. 3. 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 These results were called by telephone at the time of interpretation on 06/20/2017 at 5:15 am to Dr. Vanita Panda, who verbally acknowledged these results. Electronically Signed   By: Kristine Garbe M.D.   On: 06/20/2017 05:22   [name] Head Wo Contrast  Result Date: 06/20/2017 CLINICAL DATA:  Diabetes, hypertension and hyperlipidemia. Acute presentation with right hand weakness and numbness. Acute left MCA territory infarctions by MRI earlier today. EXAM: MRA HEAD WITHOUT CONTRAST TECHNIQUE: Angiographic images of the Circle of Willis were obtained using MRA technique without intravenous contrast. COMPARISON:  MRI same day FINDINGS: Both internal carotid arteries show antegrade flow. No siphon stenosis. The anterior and middle cerebral vessels are patent. No missing branch vessels identifiable. Distal vessels do show some irregularity, particularly on the left. Both vertebral arteries are patent with the left being dominant. No basilar  stenosis, though there is mild irregularity. Posterior circulation branch vessels are patent. Mild atherosclerotic irregularity of the more distal PCA branches right more than left. IMPRESSION: No large or medium vessel occlusion or correctable proximal stenosis identified. Some narrowing and irregularity of the more distal intracranial branch vessels. Electronically Signed   By: Nelson Chimes M.D.   On: 06/20/2017 11:25    Microbiology: No results found for this or any previous visit (from the past 240 hour(s)).   Labs: Basic Metabolic Panel:  Recent Labs Lab 06/19/17 2305 06/19/17 2322 06/22/17 0516  NA 142 144 139  K 4.8 4.7 4.1  CL 111 111 108  CO2 24  --  25  GLUCOSE 89 85 150*  BUN 31* 31* 22*  CREATININE 1.22 1.10 1.04  CALCIUM 9.0  --  8.9   Liver Function Tests:  Recent Labs Lab 06/19/17 2305  AST 36  ALT 27  ALKPHOS 66  BILITOT 0.6  PROT 6.6  ALBUMIN 2.9*   No results for input(s): LIPASE, AMYLASE in the last 168 hours. No results for input(s): AMMONIA in the last 168 hours. CBC:  Recent Labs Lab 06/19/17 2305 06/19/17 2322 06/22/17 0516  WBC 8.1  --  5.9  NEUTROABS 5.8  --  2.6  HGB 13.2 12.6* 12.8*  HCT 40.0 37.0* 38.7*  MCV 88.3  --  88.2  PLT 219  --  174   Cardiac Enzymes: No results for input(s): CKTOTAL, CKMB, CKMBINDEX, TROPONINI in the last 168 hours. BNP: BNP (last 3 results) No results for input(s): BNP in the last 8760 hours.  ProBNP (last 3 results) No results for input(s): PROBNP in  the last 8760 hours.  CBG:  Recent Labs Lab 06/22/17 1225 06/22/17 1728 06/22/17 2141 06/23/17 0749 06/23/17 1204  GLUCAP 168* 187* 170* 189* 201*       Signed:  THOMPSON,DANIEL MD.  [facility] 06/23/2017, 2:45 PM

## 2017-06-24 ENCOUNTER — Telehealth: Payer: Self-pay | Admitting: *Deleted

## 2017-06-24 NOTE — Telephone Encounter (Signed)
After review of chart no HH orders had been placed however PT eval recommendations listed Outpt OT as followup??? CM sent referral to PCP Webb Silversmith, NP to enter orders for HHPT and alert this CM so I can then alert [facility] for follow up. I am in process of contacting Endo re: education for TEE scheduled for tomorrow to assess instructions to be given to pt. Will call later today with these.

## 2017-06-24 NOTE — Telephone Encounter (Signed)
He understands he is to see PCP within 1-2 weeks and will get orders for HHPT which will be provided by [facility]. No questions. Questions about location of short Stay answered. CM will sign off for now but will be available should additional discharge needs arise or disposition change.

## 2017-06-25 ENCOUNTER — Other Ambulatory Visit: Payer: Self-pay | Admitting: *Deleted

## 2017-06-25 ENCOUNTER — Ambulatory Visit (HOSPITAL_COMMUNITY)
Admission: RE | Admit: 2017-06-25 | Discharge: 2017-06-25 | Disposition: A | Discharge status: Home / Self Care | Payer: Medicare Other | Source: Ambulatory Visit | Attending: Cardiovascular Disease | Admitting: Cardiovascular Disease

## 2017-06-25 ENCOUNTER — Telehealth: Payer: Self-pay

## 2017-06-25 ENCOUNTER — Other Ambulatory Visit: Payer: Self-pay | Admitting: Cardiovascular Disease

## 2017-06-25 ENCOUNTER — Other Ambulatory Visit: Payer: Self-pay

## 2017-06-25 ENCOUNTER — Encounter (HOSPITAL_COMMUNITY)
Admission: RE | Disposition: A | Discharge status: Home / Self Care | Payer: Self-pay | Source: Ambulatory Visit | Attending: Cardiovascular Disease

## 2017-06-25 ENCOUNTER — Ambulatory Visit (HOSPITAL_BASED_OUTPATIENT_CLINIC_OR_DEPARTMENT_OTHER)
Admission: RE | Admit: 2017-06-25 | Discharge: 2017-06-25 | Disposition: A | Discharge status: Home / Self Care | Payer: Medicare Other | Source: Ambulatory Visit | Attending: Cardiovascular Disease | Admitting: Cardiovascular Disease

## 2017-06-25 ENCOUNTER — Encounter (HOSPITAL_COMMUNITY): Payer: Self-pay | Admitting: *Deleted

## 2017-06-25 DIAGNOSIS — I10 Essential (primary) hypertension: Secondary | ICD-10-CM | POA: Insufficient documentation

## 2017-06-25 DIAGNOSIS — I639 Cerebral infarction, unspecified: Secondary | ICD-10-CM

## 2017-06-25 DIAGNOSIS — E119 Type 2 diabetes mellitus without complications: Secondary | ICD-10-CM | POA: Diagnosis not present

## 2017-06-25 DIAGNOSIS — I4891 Unspecified atrial fibrillation: Secondary | ICD-10-CM | POA: Insufficient documentation

## 2017-06-25 DIAGNOSIS — E785 Hyperlipidemia, unspecified: Secondary | ICD-10-CM | POA: Insufficient documentation

## 2017-06-25 DIAGNOSIS — I638 Other cerebral infarction: Secondary | ICD-10-CM | POA: Diagnosis not present

## 2017-06-25 HISTORY — PX: TEE WITHOUT CARDIOVERSION: SHX5443

## 2017-06-25 LAB — GLUCOSE, CAPILLARY: Glucose-Capillary: 236 mg/dL — ABNORMAL HIGH (ref 65–99)

## 2017-06-25 SURGERY — ECHOCARDIOGRAM, TRANSESOPHAGEAL
Anesthesia: Moderate Sedation

## 2017-06-25 MED ORDER — FENTANYL CITRATE (PF) 100 MCG/2ML IJ SOLN
INTRAMUSCULAR | Status: AC
  Filled 2017-06-25: qty 2

## 2017-06-25 MED ORDER — MIDAZOLAM HCL 5 MG/ML IJ SOLN
INTRAMUSCULAR | Status: AC
  Filled 2017-06-25: qty 2

## 2017-06-25 MED ORDER — MIDAZOLAM HCL 10 MG/2ML IJ SOLN
INTRAMUSCULAR | Status: DC | PRN
  Administered 2017-06-25 (×2): 2 mg via INTRAVENOUS

## 2017-06-25 MED ORDER — SODIUM CHLORIDE 0.9 % IV SOLN
INTRAVENOUS | Status: DC
  Administered 2017-06-25: 14:00:00 via INTRAVENOUS

## 2017-06-25 MED ORDER — FENTANYL CITRATE (PF) 100 MCG/2ML IJ SOLN
INTRAMUSCULAR | Status: DC | PRN
  Administered 2017-06-25 (×2): 25 ug via INTRAVENOUS

## 2017-06-25 MED ORDER — BUTAMBEN-TETRACAINE-BENZOCAINE 2-2-14 % EX AERO
INHALATION_SPRAY | CUTANEOUS | Status: DC | PRN
Start: 2017-06-25 — End: 2017-06-25
  Administered 2017-06-25: 2 via TOPICAL

## 2017-06-25 NOTE — Discharge Instructions (Signed)
TEE ° °YOU HAD AN CARDIAC PROCEDURE TODAY: Refer to the procedure report and other information in the discharge instructions given to you for any specific questions about what was found during the examination. If this information does not answer your questions, please call [facility] office at [phone] to clarify.  ° °DIET: Your first meal following the procedure should be a light meal and then it is ok to progress to your normal diet. A half-sandwich or bowl of soup is an example of a good first meal. Heavy or fried foods are harder to digest and may make you feel nauseous or bloated. Drink plenty of fluids but you should avoid alcoholic beverages for 24 hours. If you had a esophageal dilation, please see attached instructions for diet.  ° °ACTIVITY: Your care partner should take you home directly after the procedure. You should plan to take it easy, moving slowly for the rest of the day. You can resume normal activity the day after the procedure however YOU SHOULD NOT DRIVE, use power tools, machinery or perform tasks that involve climbing or major physical exertion for 24 hours (because of the sedation medicines used during the test).  ° °SYMPTOMS TO REPORT IMMEDIATELY: °A cardiologist can be reached at any hour. Please call [phone] for any of the following symptoms:  °Vomiting of blood or coffee ground material  °New, significant abdominal pain  °New, significant chest pain or pain under the shoulder blades  °Painful or persistently difficult swallowing  °New shortness of breath  °Black, tarry-looking or red, bloody stools ° °FOLLOW UP:  °Please also call with any specific questions about appointments or follow up tests. ° ° °Moderate Conscious Sedation, Adult, Care After °These instructions provide you with information about caring for yourself after your procedure. Your health care provider may also give you more specific instructions. Your treatment has been planned according to current medical  practices, but problems sometimes occur. Call your health care provider if you have any problems or questions after your procedure. °What can I expect after the procedure? °After your procedure, it is common: °· To feel sleepy for several hours. °· To feel clumsy and have poor balance for several hours. °· To have poor judgment for several hours. °· To vomit if you eat too soon. ° °Follow these instructions at home: °For at least 24 hours after the procedure: ° °· Do not: °? Participate in activities where you could fall or become injured. °? Drive. °? Use heavy machinery. °? Drink alcohol. °? Take sleeping pills or medicines that cause drowsiness. °? Make important decisions or sign legal documents. °? Take care of children on your own. °· Rest. °Eating and drinking °· Follow the diet recommended by your health care provider. °· If you vomit: °? Drink water, juice, or soup when you can drink without vomiting. °? Make sure you have little or no nausea before eating solid foods. °General instructions °· Have a responsible adult stay with you until you are awake and alert. °· Take over-the-counter and prescription medicines only as told by your health care provider. °· If you smoke, do not smoke without supervision. °· Keep all follow-up visits as told by your health care provider. This is important. °Contact a health care provider if: °· You keep feeling nauseous or you keep vomiting. °· You feel light-headed. °· You develop a rash. °· You have a fever. °Get help right away if: °· You have trouble breathing. °This information is not intended to replace advice   given to you by your health care provider. Make sure you discuss any questions you have with your health care provider. °Document Released: 07/23/2013 Document Revised: 03/06/2016 Document Reviewed: 01/22/2016 °Elsevier Interactive Patient Education © 2018 [employer] ° °

## 2017-06-25 NOTE — Telephone Encounter (Signed)
Attempted to reach patient to complete TCM and schedule hospital f/u, no answer.

## 2017-06-25 NOTE — H&P (Signed)
Fran S Kruzel is a 76 y.o. male who has presented today for surgery, with the diagnosis of atrial fibrillation. The various methods of treatment have been discussed with the patient and family. After consideration of risks, benefits and other options for treatment, the patient has consented to Procedure(s): TRANSESOPHAGEAL ECHOCARDIOGRAM (TEE) (N/A) as a surgical intervention . The patient's history has been reviewed, patient examined, no change in status, stable for surgery. I have reviewed the patient's chart and labs. Questions were answered to the patient's satisfaction.   Leslea Vowles C. Oval Linsey, MD, [facility]  06/25/2017 2:58 PM

## 2017-06-25 NOTE — Consult Note (Signed)
Received call from [place] with [facility] program indicating Mr. Fedrick discharged over the weekend without home health orders. Therefore, [facility] unable to admit to their program. Discussed that referral be made for Teleph[facility] RNCM for post hospital follow up for DM disease management and education.   Will make referral for Telephonic [facility] RNCM for DM education and management.   Marthenia Rolling, MSN-Ed, RN,BSN [facility] Liaison [phone]

## 2017-06-25 NOTE — Patient Outreach (Signed)
[place] [facility]) Care Management  06/25/2017  Jake Gross 07-30-1941 [id number]  Transition of care  Week # 1 Referral date: 06/25/17 Referral source: Hospital Liaison referral, status post hospital discharge on 06/19/17 to 06/23/17 Program: Transition of care Insurance: Medicare  Attempt #1  SUBJECTIVE: telephone call to patient regarding transition of care outreach. Unable to reach patient. HIPAA compliant voice message left with call back phone number.   PLAN: RNCM will attempt 2nd telephone call to patient within 3 business days.   Quinn Plowman RN,BSN,CCM [facility] Telephonic  [phone]

## 2017-06-25 NOTE — Progress Notes (Signed)
  Echocardiogram Echocardiogram Transesophageal has been performed.  Jake Gross M 06/25/2017, 3:50 PM

## 2017-06-25 NOTE — CV Procedure (Signed)
Brief TEE Note  LVEF 55-60% Mild MR Trivial AR and PR Degenerative changes noted on the aortic valve No LA or LAA thrombus or mass No evidence of PFO or ASD by color flow Doppler or saline microcavitation study.   During this procedure the patient is administered a total of Versed 4 mg and Fentanyl 50 mcg to achieve and maintain moderate conscious sedation.  The patient's heart rate, blood pressure, and oxygen saturation are monitored continuously during the procedure. The period of conscious sedation is 20 minutes, of which I was present face-to-face 100% of this time.  For additional details see full report.  Ruhama Lehew C. Oval Linsey, MD, [facility] 06/25/2017 3:32 PM

## 2017-06-26 ENCOUNTER — Encounter: Payer: Self-pay | Admitting: Internal Medicine

## 2017-06-26 ENCOUNTER — Other Ambulatory Visit: Payer: Self-pay

## 2017-06-26 ENCOUNTER — Ambulatory Visit (INDEPENDENT_AMBULATORY_CARE_PROVIDER_SITE_OTHER): Payer: Medicare Other | Admitting: Internal Medicine

## 2017-06-26 VITALS — BP 146/88 | HR 59 | Temp 98.0°F | Wt 228.0 lb

## 2017-06-26 DIAGNOSIS — I10 Essential (primary) hypertension: Secondary | ICD-10-CM

## 2017-06-26 DIAGNOSIS — I639 Cerebral infarction, unspecified: Secondary | ICD-10-CM

## 2017-06-26 DIAGNOSIS — Z8673 Personal history of transient ischemic attack (TIA), and cerebral infarction without residual deficits: Secondary | ICD-10-CM

## 2017-06-26 MED ORDER — AMLODIPINE BESYLATE 10 MG PO TABS: 10.0000 mg | ORAL_TABLET | Freq: Every day | ORAL | 0 refills | Status: DC

## 2017-06-26 NOTE — Telephone Encounter (Signed)
Patient is scheduled tomorrow at 10:30am. No further action required.

## 2017-06-26 NOTE — Patient Outreach (Signed)
[place] [facility]) Care Management  06/26/2017  Jake Gross 27-Sep-1941 [id number]   Transition of care  Week # 1 Referral date: 06/25/17 Referral source: Hospital Liaison referral, status post hospital discharge on 06/19/17 to 06/23/17 Program: Transition of care Insurance: Medicare  Providers: Webb Silversmith, NP Support; Patient lives alone.  Has support of son.  Telephone call to patient regarding transition of care follow up. HIPAA verified with patient. Discussed transition of care program with patient as well as [facility] care management services. Patient verbally agreed to both transition of care follow up and [facility] care management services.  Patient states he has an appointment with his primary MD today at 3pm. Patient states he will call today to make an appointment with Dr. Loanne Drilling, endocrinologist and neurologist, Dr. Leonie Man. Patient states his brother will be taking him to his appointments for this week. Patient states he does not have any residual effects from the stroke at this time. 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 Patient states he has this problem prior to his stroke.  Patient states he does not feel his diabetic medication is working. Patient reports his insulin was changed while he was in the hospital.  Patient states she was to to continue taking Humulog insulin 3 units before each meal. Patient reports his fasting blood sugar this morning was 282. Patient states prior to going in the hospital he was taking 110 units of humulog before each meal.  Patient verbally agreed to next telephone outreach with [facility].   ASSESSMENT; Per patients MEDICAL RECORD NUMBER PCP: Jearld Fenton, NP  Admit date: 06/19/2017 Discharge date: 9/8/2018SSESSMENT;  Discharge Diagnoses:  Principal Problem:   Stroke [facility]) Active Problems:   Diabetes mellitus type 2, insulin dependent ([place])   Hypertension   HLD (hyperlipidemia) 76  y.o. male with medical history significant for diabetes, hypertension, hyperlipidemia, arthritis   Patient agreed to transition of care follow up by [facility]. Patient will benefit from short term transition of care the transition to health coach follow up for diabetes education/ management.   PLAN:  RNCM will follow patient for transition of care calls. RNCM will follow up with patient within 1 week.  RNCM will send patient [facility] care management welcome packet with consent form. RNCM will send patients primary MD involvement letter.   Quinn Plowman RN,BSN,CCM [facility] Telephonic  [phone]

## 2017-06-26 NOTE — Progress Notes (Signed)
Subjective:    Patient ID: [employer], male    DOB: [birth year], 76 y.o.   MRN: [id number]  HPI  Pt presents to the clinic today for [facility] Followup. He went to the ER 9/4 with c/o right side weakness. CT head did not show any acute findings. MRI brain showed left partial stroke. TTE did not show any acute emboli, EF > 55%. 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 His A1C was 7.6%. He was continued on Crestor and ASA. He was started on Plavix. He is following with endocrinology re: diabetes management and reports he has a follow up appt tommorow. He has not made his appt with neurology yet. Since discharge, he denies headaches, visual changes, dizziness or weakness. Of note, his BP today is 146/88. He is taking Losartan HCT as prescribed.  Review of Systems      Past Medical History:  Diagnosis Date  . 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 Arthritis   . Cervical spondylosis   . Diabetes mellitus   . Hyperlipidemia   . Hypertension   . Stroke ([place]) 06/19/2017  . Vitamin D deficiency     Current Outpatient Prescriptions  Medication Sig Dispense Refill  . clopidogrel (PLAVIX) 75 MG tablet Take 1 tablet (75 mg total) by mouth daily. 30 tablet 1  . folic acid (FOLVITE) 1 MG tablet Take 1 mg by mouth every morning.     Marland Kitchen glucose blood (ONE TOUCH ULTRA TEST) test strip 1 each by Other route 2 (two) times daily. And lancets 2/day 200 each 3  . insulin lispro (HUMALOG KWIKPEN) 100 UNIT/ML KiwkPen 3 units 3 times daily with meals. 5 pen 0  . Insulin Pen Needle 31G X 5 MM MISC 1 each by Does not apply route 3 (three) times daily as needed. 300 each 4  . losartan-hydrochlorothiazide (HYZAAR) 100-25 MG tablet TAKE 1 TABLET DAILY 90 tablet 1  . methotrexate (RHEUMATREX) 2.5 MG tablet Take 20 mg by mouth every Friday.    . rosuvastatin (CRESTOR) 20 MG tablet Take 1 tablet (20 mg total) by mouth at bedtime. 90 tablet 1     No current facility-administered medications for this visit.     No Known Allergies  Family History  Problem Relation Age of Onset  . Cancer Father        "all over"  . Coronary artery disease Brother   . Diabetes Brother   . Stroke Mother   . Diabetes Mother   . Colon cancer Neg Hx   . Esophageal cancer Neg Hx   . Rectal cancer Neg Hx   . Stomach cancer Neg Hx     Social History   Social History  . Marital status: Divorced    Spouse name: N/A  . Number of children: 2  . Years of education: N/A   Occupational History  . 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 Smoking status: Former Smoker    Types: Cigarettes    Quit date: 08/13/1994  . Smokeless tobacco: Never Used  . Alcohol use No  . Drug use: No  . Sexual activity: Not Currently   Other Topics Concern  . Not on file   Social History Narrative  . No narrative on file     Constitutional: Denies fever, malaise, fatigue, headache or abrupt weight changes.  Respiratory: Denies difficulty breathing, shortness of breath, cough or sputum production.   Cardiovascular:  Denies chest pain, chest tightness, palpitations or swelling in the hands or feet.  Musculoskeletal: Denies decrease in range of motion, difficulty with gait, muscle pain or joint pain and swelling.  Neurological: Denies dizziness, difficulty with memory, difficulty with speech or problems with balance and coordination.    No other specific complaints in a complete review of systems (except as listed in HPI above).  Objective:   Physical Exam   BP (!) 146/88   Pulse (!) 59   Temp 98 F (36.7 C) (Oral)   Wt 228 lb (103.4 kg)   SpO2 97%   BMI 35.71 kg/m  Wt Readings from Last 3 Encounters:  06/26/17 228 lb (103.4 kg)  06/20/17 230 lb 8 oz (104.6 kg)  04/20/17 231 lb (104.8 kg)    General: Appears his stated age, obese in NAD. Cardiovascular: Normal rate and rhythm. S1,S2 noted.  No murmur, rubs or gallops noted. No carotid  bruits noted. Pulmonary/Chest: Normal effort and positive vesicular breath sounds. No respiratory distress. No wheezes, rales or ronchi noted.  AMusculoskeletal: Strength 5/5 BUE/BLE. No difficulty with gait.  Neurological: Alert and oriented. . Coordination normal.    BMET    Component Value Date/Time   NA 139 06/22/2017 0516   NA 141 01/19/2016   K 4.1 06/22/2017 0516   CL 108 06/22/2017 0516   CO2 25 06/22/2017 0516   GLUCOSE 150 (H) 06/22/2017 0516   BUN 22 (H) 06/22/2017 0516   BUN 34 (A) 01/19/2016   CREATININE 1.04 06/22/2017 0516   CREATININE 1.14 11/07/2016 1514   CALCIUM 8.9 06/22/2017 0516   GFRNONAA >60 06/22/2017 0516   GFRAA >60 06/22/2017 0516    Lipid Panel     Component Value Date/Time   CHOL 113 06/22/2017 0516   TRIG 172 (H) 06/22/2017 0516   HDL 23 (L) 06/22/2017 0516   CHOLHDL 4.9 06/22/2017 0516   VLDL 34 06/22/2017 0516   LDLCALC 56 06/22/2017 0516    CBC    Component Value Date/Time   WBC 5.9 06/22/2017 0516   RBC 4.39 06/22/2017 0516   HGB 12.8 (L) 06/22/2017 0516   HCT 38.7 (L) 06/22/2017 0516   PLT 174 06/22/2017 0516   MCV 88.2 06/22/2017 0516   MCH 29.2 06/22/2017 0516   MCHC 33.1 06/22/2017 0516   RDW 14.4 06/22/2017 0516   LYMPHSABS 2.3 06/22/2017 0516   MONOABS 0.7 06/22/2017 0516   EOSABS 0.3 06/22/2017 0516   BASOSABS 0.0 06/22/2017 0516    Hgb A1C Lab Results  Component Value Date   HGBA1C 7.3 (H) 06/20/2017           Assessment & Plan:   [facility] Follow Up for Stroke:  Hospital notes, labs, procedures and imaging reveiwed Continue Plavix Make your follow up appt with neurology You are not taking enough Humalog but have a follow up appt with endo tomorrow  HTN:  eRx for Amlodipine 10 mg daily in addition to Losartan HCT Encouraged DASH diet and exercise for weight loss  RTC in 2 weeks for HTN follow up Webb Silversmith, NP

## 2017-06-26 NOTE — Patient Instructions (Signed)

## 2017-06-26 NOTE — Telephone Encounter (Signed)
Called patient & left VM to please call back to schedule f/u ASAP.

## 2017-06-27 ENCOUNTER — Encounter: Payer: Self-pay | Admitting: Endocrinology

## 2017-06-27 ENCOUNTER — Ambulatory Visit (INDEPENDENT_AMBULATORY_CARE_PROVIDER_SITE_OTHER): Payer: Medicare Other | Admitting: Endocrinology

## 2017-06-27 DIAGNOSIS — E1165 Type 2 diabetes mellitus with hyperglycemia: Secondary | ICD-10-CM | POA: Diagnosis not present

## 2017-06-27 DIAGNOSIS — I639 Cerebral infarction, unspecified: Secondary | ICD-10-CM | POA: Diagnosis not present

## 2017-06-27 DIAGNOSIS — Z794 Long term (current) use of insulin: Secondary | ICD-10-CM

## 2017-06-27 MED ORDER — INSULIN LISPRO 100 UNIT/ML (KWIKPEN): 5.0000 [IU] | PEN_INJECTOR | Freq: Two times a day (BID) | SUBCUTANEOUS | 0 refills | Status: DC

## 2017-06-27 MED ORDER — INSULIN GLARGINE 100 UNIT/ML SOLOSTAR PEN: 10.0000 [IU] | PEN_INJECTOR | Freq: Every day | SUBCUTANEOUS | 3 refills | Status: DC

## 2017-06-27 NOTE — Patient Instructions (Addendum)
check your blood sugar twice a day.  vary the time of day when you check, between before the 3 meals, and at bedtime.  also check if you have symptoms of your blood sugar being too high or too low.  please keep a record of the readings and bring it to your next appointment here (or you can bring the meter itself).  You can write it on any piece of paper.  please call us sooner if your blood sugar goes below 70, or if you have a lot of readings over 200. Please resume the lantus, 10 units at bedtime, and:   increase the humalog to 5 units twice a day (just before meals).  If you eat at lunchtime, take 3 units of humalog with that.  Please call or message [place] next week, to tell us how the blood sugar is doing.  With time, your insulin need will return back to what it was before you were in the hospital.  Please come back for a follow-up appointment in 1 month.

## 2017-06-27 NOTE — Progress Notes (Signed)
Subjective:    Patient ID: [employer], male    DOB: 10/31/1940, 76 y.o.   MRN: [id number]  HPI Pt returns for f/u of diabetes mellitus:  DM type: Insulin-requiring type 2 DM.   Dx'ed: 6599 Complications: polyneuropathy, retinopathy, CVA, and renal insufficiency.  Therapy: insulin since 2005 DKA: never Severe hypoglycemia: last episode was in 2014 Pancreatitis: never.  Pancreatic imaging: normal CT in 2006 Other: he eats 2 meals per day; he takes multiple daily injections.  Interval history: he had CVA last week.  Insulin was reduced in the hospital.  He brings a record of his cbg's which I have reviewed today.  It varies from 125-200's.  There is no trend throughout the day.   Past Medical History:  Diagnosis Date  . 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 Arthritis   . Cervical spondylosis   . Diabetes mellitus   . Hyperlipidemia   . Hypertension   . Stroke ([place]) 06/19/2017  . Vitamin D deficiency     Past Surgical History:  Procedure Laterality Date  . ABCESS DRAINAGE Left    buttocks  . CARDIOVASCULAR STRESS TEST  10/12/1999   EF 63%. NO ISCHEMIA  . COLONOSCOPY W/ POLYPECTOMY     5 polyps  . EUS N/A 03/11/2015   Procedure: LOWER ENDOSCOPIC ULTRASOUND (EUS);  Surgeon: Milus Banister, MD;  Location: [facility];  Service: Endoscopy;  Laterality: N/A;  . FLEXIBLE SIGMOIDOSCOPY N/A 02/02/2015   Procedure: FLEXIBLE SIGMOIDOSCOPY;  Surgeon: Inda Castle, MD;  Location: [facility];  Service: Endoscopy;  Laterality: N/A;  ERBE  . PARTIAL PROCTECTOMY BY TEM N/A 04/08/2015   Procedure: TEM PARTIAL PROCTECTOMY OF RECTAL MASS;  Surgeon: Michael Boston, MD;  Location: [facility];  Service: General;  Laterality: N/A;  . TEE WITHOUT CARDIOVERSION N/A 06/25/2017   Procedure: TRANSESOPHAGEAL ECHOCARDIOGRAM (TEE);  Surgeon: Skeet Latch, MD;  Location: [facility];  Service: Cardiovascular;  Laterality: N/A;  . TONSILLECTOMY  AND ADENOIDECTOMY     as child    Social History   Social History  . Marital status: Divorced    Spouse name: N/A  . Number of children: 2  . Years of education: N/A   Occupational History  . 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 Smoking status: Former Smoker    Types: Cigarettes    Quit date: 08/13/1994  . Smokeless tobacco: Never Used  . Alcohol use No  . Drug use: No  . Sexual activity: Not Currently   Other Topics Concern  . Not on file   Social History Narrative  . No narrative on file    Current Outpatient Prescriptions on File Prior to Visit  Medication Sig Dispense Refill  . amLODipine (NORVASC) 10 MG tablet Take 1 tablet (10 mg total) by mouth daily. 30 tablet 0  . clopidogrel (PLAVIX) 75 MG tablet Take 1 tablet (75 mg total) by mouth daily. 30 tablet 1  . folic acid (FOLVITE) 1 MG tablet Take 1 mg by mouth every morning.     Marland Kitchen glucose blood (ONE TOUCH ULTRA TEST) test strip 1 each by Other route 2 (two) times daily. And lancets 2/day 200 each 3  . Insulin Pen Needle 31G X 5 MM MISC 1 each by Does not apply route 3 (three) times daily as needed. 300 each 4  . losartan-hydrochlorothiazide (HYZAAR) 100-25 MG tablet TAKE 1 TABLET DAILY 90 tablet 1  . methotrexate (  RHEUMATREX) 2.5 MG tablet Take 20 mg by mouth every Friday.    . rosuvastatin (CRESTOR) 20 MG tablet Take 1 tablet (20 mg total) by mouth at bedtime. 90 tablet 1   No current facility-administered medications on file prior to visit.     No Known Allergies  Family History  Problem Relation Age of Onset  . Cancer Father        "all over"  . Coronary artery disease Brother   . Diabetes Brother   . Stroke Mother   . Diabetes Mother   . Colon cancer Neg Hx   . Esophageal cancer Neg Hx   . Rectal cancer Neg Hx   . Stomach cancer Neg Hx     BP (!) 162/86   Pulse 63   Wt 228 lb 9.6 oz (103.7 kg)   SpO2 97%   BMI 35.80 kg/m   Review of Systems He denies hypoglycemia.      Objective:     Physical Exam VITAL SIGNS:  See vs page GENERAL: no distress Pulses: foot pulses are intact bilaterally.   MSK: no deformity of the feet or ankles.  CV: trace bilat edema of the legs.  Skin:  no ulcer on the feet or ankles.  normal temp on the feet and ankle, but there is hyperpigmentation of the legs.  Neuro: sensation is intact to touch on the feet and ankles.    Lab Results  Component Value Date   HGBA1C 7.3 (H) 06/20/2017      Assessment & Plan:  CVA: new.  This has reduced insulin requirement.   Insulin-requiring type 2 DM, with DR: she needs increased rx.  Patient Instructions  check your blood sugar twice a day.  vary the time of day when you check, between before the 3 meals, and at bedtime.  also check if you have symptoms of your blood sugar being too high or too low.  please keep a record of the readings and bring it to your next appointment here (or you can bring the meter itself).  You can write it on any piece of paper.  please call us sooner if your blood sugar goes below 70, or if you have a lot of readings over 200. Please resume the lantus, 10 units at bedtime, and:   increase the humalog to 5 units twice a day (just before meals).  If you eat at lunchtime, take 3 units of humalog with that.  Please call or message [place] next week, to tell us how the blood sugar is doing.  With time, your insulin need will return back to what it was before you were in the hospital.  Please come back for a follow-up appointment in 1 month.

## 2017-06-29 ENCOUNTER — Telehealth: Payer: Self-pay | Admitting: Internal Medicine

## 2017-06-29 NOTE — Telephone Encounter (Signed)
Caller Name:Erin Justin Mend Relationship to Patient:[facility]   Best number:[phone] Pharmacy:  Reason for call: verbal order to change start of care from today until Monday due to pt request.

## 2017-07-02 ENCOUNTER — Ambulatory Visit: Payer: Medicare Other | Admitting: Internal Medicine

## 2017-07-02 ENCOUNTER — Telehealth: Payer: Self-pay

## 2017-07-02 DIAGNOSIS — E119 Type 2 diabetes mellitus without complications: Secondary | ICD-10-CM | POA: Diagnosis not present

## 2017-07-02 DIAGNOSIS — I69351 Hemiplegia and hemiparesis following cerebral infarction affecting right dominant side: Secondary | ICD-10-CM | POA: Diagnosis not present

## 2017-07-02 DIAGNOSIS — M1990 Unspecified osteoarthritis, unspecified site: Secondary | ICD-10-CM | POA: Diagnosis not present

## 2017-07-02 DIAGNOSIS — Z7902 Long term (current) use of antithrombotics/antiplatelets: Secondary | ICD-10-CM | POA: Diagnosis not present

## 2017-07-02 DIAGNOSIS — M47812 Spondylosis without myelopathy or radiculopathy, cervical region: Secondary | ICD-10-CM | POA: Diagnosis not present

## 2017-07-02 DIAGNOSIS — I10 Essential (primary) hypertension: Secondary | ICD-10-CM | POA: Diagnosis not present

## 2017-07-02 NOTE — Telephone Encounter (Signed)
This is fine 

## 2017-07-02 NOTE — Telephone Encounter (Signed)
Left detailed msg on VM.

## 2017-07-02 NOTE — Telephone Encounter (Signed)
Called and notified patient. He had no further questions & stated he would call back the next few days with blood sugar readings.

## 2017-07-02 NOTE — Telephone Encounter (Signed)
Patient called in to give his blood sugar readings for the past week.  9/12 @ night- 389  9/13 @ AM- 371 9/13 @ night- 312  9/14 @ AM- 355 9/14@ night- 292  9/15 @ AM- 385 9/15 @ night- 509  9/16 @ AM- 312 9/16 @ night- 309  9/17 @ AM- 391  Please advise, thank you!

## 2017-07-02 NOTE — Telephone Encounter (Signed)
Please increase the lantus to 20 units at bedtime, and:   increase the humalog to 10 units twice a day (with breakfast and supper).  If you eat at lunchtime, take 3 units of humalog with that.  Please call or message [place] in a few days, to tell us how the blood sugar is doing

## 2017-07-03 ENCOUNTER — Ambulatory Visit: Payer: Self-pay

## 2017-07-03 ENCOUNTER — Encounter: Payer: Self-pay | Admitting: Internal Medicine

## 2017-07-04 DIAGNOSIS — I10 Essential (primary) hypertension: Secondary | ICD-10-CM | POA: Diagnosis not present

## 2017-07-04 DIAGNOSIS — I69351 Hemiplegia and hemiparesis following cerebral infarction affecting right dominant side: Secondary | ICD-10-CM | POA: Diagnosis not present

## 2017-07-04 DIAGNOSIS — Z7902 Long term (current) use of antithrombotics/antiplatelets: Secondary | ICD-10-CM | POA: Diagnosis not present

## 2017-07-04 DIAGNOSIS — M1990 Unspecified osteoarthritis, unspecified site: Secondary | ICD-10-CM | POA: Diagnosis not present

## 2017-07-04 DIAGNOSIS — E119 Type 2 diabetes mellitus without complications: Secondary | ICD-10-CM | POA: Diagnosis not present

## 2017-07-04 DIAGNOSIS — M47812 Spondylosis without myelopathy or radiculopathy, cervical region: Secondary | ICD-10-CM | POA: Diagnosis not present

## 2017-07-06 ENCOUNTER — Telehealth: Payer: Self-pay | Admitting: Endocrinology

## 2017-07-06 NOTE — Telephone Encounter (Signed)
Patient notified

## 2017-07-06 NOTE — Telephone Encounter (Signed)
Patient called to give blood sugar readings  07/03/17 Am 257 Pm 354  07/04/17 Am 263 Pm 304  07/05/17 Am 265 Pm 319  07/06/17 Am 281

## 2017-07-06 NOTE — Telephone Encounter (Signed)
Please increase the lantus to 30 units at bedtime, and:  increase the humalog to 20 units twice a day (with breakfast and supper). Please call or message [place] next week, to tell us how the blood sugar is doing

## 2017-07-09 ENCOUNTER — Telehealth: Payer: Self-pay

## 2017-07-09 DIAGNOSIS — Z7902 Long term (current) use of antithrombotics/antiplatelets: Secondary | ICD-10-CM | POA: Diagnosis not present

## 2017-07-09 DIAGNOSIS — I69351 Hemiplegia and hemiparesis following cerebral infarction affecting right dominant side: Secondary | ICD-10-CM | POA: Diagnosis not present

## 2017-07-09 DIAGNOSIS — M47812 Spondylosis without myelopathy or radiculopathy, cervical region: Secondary | ICD-10-CM | POA: Diagnosis not present

## 2017-07-09 DIAGNOSIS — E119 Type 2 diabetes mellitus without complications: Secondary | ICD-10-CM | POA: Diagnosis not present

## 2017-07-09 DIAGNOSIS — I10 Essential (primary) hypertension: Secondary | ICD-10-CM | POA: Diagnosis not present

## 2017-07-09 DIAGNOSIS — M1990 Unspecified osteoarthritis, unspecified site: Secondary | ICD-10-CM | POA: Diagnosis not present

## 2017-07-09 NOTE — Telephone Encounter (Signed)
Erin Pt with [facility] left v/m; pt was seen today for home PT and sitting BP 186/96 P 65 after resting BP 165/110 P 60s; no symptoms or problems but wanted to notify about elevated BP readings; also request [place] skilled nursing order for BP, diabetes and medication mgt. Please call skilled nursing orders to [place] at [phone].

## 2017-07-09 NOTE — Telephone Encounter (Signed)
BP noted. [place] for home health

## 2017-07-10 ENCOUNTER — Other Ambulatory Visit: Payer: Self-pay

## 2017-07-10 ENCOUNTER — Encounter: Payer: Self-pay | Admitting: Internal Medicine

## 2017-07-10 ENCOUNTER — Ambulatory Visit (INDEPENDENT_AMBULATORY_CARE_PROVIDER_SITE_OTHER): Payer: Medicare Other | Admitting: Internal Medicine

## 2017-07-10 DIAGNOSIS — I639 Cerebral infarction, unspecified: Secondary | ICD-10-CM | POA: Diagnosis not present

## 2017-07-10 DIAGNOSIS — I10 Essential (primary) hypertension: Secondary | ICD-10-CM | POA: Diagnosis not present

## 2017-07-10 MED ORDER — DILTIAZEM HCL ER COATED BEADS 120 MG PO CP24: 120.0000 mg | ORAL_CAPSULE | Freq: Every day | ORAL | 0 refills | Status: DC

## 2017-07-10 NOTE — Progress Notes (Signed)
Subjective:    Patient ID: [employer], male    DOB: [birth year], 76 y.o.   MRN: [id number]  HPI  Pt presents to the clinic today for follow up of HTN. At his last visit, Amlodipine was added in addition to his Losartan HCT. He has been taking the medication as prescribed. He has noticed some swelling in his feet. His BP today is 148/84. ECG from 06/2017 reviewed.  Review of Systems      Past Medical History:  Diagnosis Date  . 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 Arthritis   . Cervical spondylosis   . Diabetes mellitus   . Hyperlipidemia   . Hypertension   . Stroke ([place]) 06/19/2017  . Vitamin D deficiency     Current Outpatient Prescriptions  Medication Sig Dispense Refill  . amLODipine (NORVASC) 10 MG tablet Take 1 tablet (10 mg total) by mouth daily. 30 tablet 0  . clopidogrel (PLAVIX) 75 MG tablet Take 1 tablet (75 mg total) by mouth daily. 30 tablet 1  . folic acid (FOLVITE) 1 MG tablet Take 1 mg by mouth every morning.     Marland Kitchen glucose blood (ONE TOUCH ULTRA TEST) test strip 1 each by Other route 2 (two) times daily. And lancets 2/day 200 each 3  . Insulin Glargine (LANTUS SOLOSTAR) 100 UNIT/ML Solostar Pen Inject 10 Units into the skin at bedtime. 5 pen 3  . insulin lispro (HUMALOG KWIKPEN) 100 UNIT/ML KiwkPen Inject 0.05 mLs (5 Units total) into the skin 2 (two) times daily with a meal. Take 3 units with lunch, if you eat then 5 pen 0  . Insulin Pen Needle 31G X 5 MM MISC 1 each by Does not apply route 3 (three) times daily as needed. 300 each 4  . losartan-hydrochlorothiazide (HYZAAR) 100-25 MG tablet TAKE 1 TABLET DAILY 90 tablet 1  . methotrexate (RHEUMATREX) 2.5 MG tablet Take 20 mg by mouth every Friday.    . rosuvastatin (CRESTOR) 20 MG tablet Take 1 tablet (20 mg total) by mouth at bedtime. 90 tablet 1   No current facility-administered medications for this visit.     No Known Allergies  Family History    Problem Relation Age of Onset  . Cancer Father        "all over"  . Coronary artery disease Brother   . Diabetes Brother   . Stroke Mother   . Diabetes Mother   . Colon cancer Neg Hx   . Esophageal cancer Neg Hx   . Rectal cancer Neg Hx   . Stomach cancer Neg Hx     Social History   Social History  . Marital status: Divorced    Spouse name: N/A  . Number of children: 2  . Years of education: N/A   Occupational History  . 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 Smoking status: Former Smoker    Types: Cigarettes    Quit date: 08/13/1994  . Smokeless tobacco: Never Used  . Alcohol use No  . Drug use: No  . Sexual activity: Not Currently   Other Topics Concern  . Not on file   Social History Narrative  . No narrative on file     Constitutional: Denies fever, malaise, fatigue, headache or abrupt weight changes.  Respiratory: Denies difficulty breathing, shortness of breath, cough or sputum production.   Cardiovascular: Pt reports swelling in his feet. Denies chest pain, chest tightness,  palpitations or swelling in the hands.  Neurological: Denies dizziness, difficulty with memory, difficulty with speech or problems with balance and coordination.   No other specific complaints in a complete review of systems (except as listed in HPI above).  Objective:   Physical Exam    BP (!) 148/84   Pulse 78   Temp 97.9 F (36.6 C) (Oral)   Wt 231 lb (104.8 kg)   SpO2 97%   BMI 36.18 kg/m  Wt Readings from Last 3 Encounters:  07/10/17 231 lb (104.8 kg)  06/27/17 228 lb 9.6 oz (103.7 kg)  06/26/17 228 lb (103.4 kg)    General: Appears their stated age, well developed, well nourished in NAD. Cardiovascular: Normal rate and rhythm. S1,S2 noted.  No murmur, rubs or gallops noted. Trace BLE edema.  Pulmonary/Chest: Normal effort and positive vesicular breath sounds. No respiratory distress. No wheezes, rales or ronchi noted.  Neurological: Alert and oriented.    BMET    Component Value Date/Time   NA 139 06/22/2017 0516   NA 141 01/19/2016   K 4.1 06/22/2017 0516   CL 108 06/22/2017 0516   CO2 25 06/22/2017 0516   GLUCOSE 150 (H) 06/22/2017 0516   BUN 22 (H) 06/22/2017 0516   BUN 34 (A) 01/19/2016   CREATININE 1.04 06/22/2017 0516   CREATININE 1.14 11/07/2016 1514   CALCIUM 8.9 06/22/2017 0516   GFRNONAA >60 06/22/2017 0516   GFRAA >60 06/22/2017 0516    Lipid Panel     Component Value Date/Time   CHOL 113 06/22/2017 0516   TRIG 172 (H) 06/22/2017 0516   HDL 23 (L) 06/22/2017 0516   CHOLHDL 4.9 06/22/2017 0516   VLDL 34 06/22/2017 0516   LDLCALC 56 06/22/2017 0516    CBC    Component Value Date/Time   WBC 5.9 06/22/2017 0516   RBC 4.39 06/22/2017 0516   HGB 12.8 (L) 06/22/2017 0516   HCT 38.7 (L) 06/22/2017 0516   PLT 174 06/22/2017 0516   MCV 88.2 06/22/2017 0516   MCH 29.2 06/22/2017 0516   MCHC 33.1 06/22/2017 0516   RDW 14.4 06/22/2017 0516   LYMPHSABS 2.3 06/22/2017 0516   MONOABS 0.7 06/22/2017 0516   EOSABS 0.3 06/22/2017 0516   BASOSABS 0.0 06/22/2017 0516    Hgb A1C Lab Results  Component Value Date   HGBA1C 7.3 (H) 06/20/2017          Assessment & Plan:

## 2017-07-10 NOTE — Telephone Encounter (Signed)
VO given.

## 2017-07-10 NOTE — Assessment & Plan Note (Signed)
Amlodipine not effective eRX for Cardizem 120 mg daily Continue Losartan HCT  Have home health check your BP on Thursday and call me with the reading

## 2017-07-10 NOTE — Patient Outreach (Signed)
[facility]) Care Management  07/10/2017  Malikhi S Fritzler July 10, 1941 [id number]  Transition of care  Week # 3 Referral date: 06/25/17 Referral source: Hospital Liaison referral, status post hospital discharge on 06/19/17 to 06/23/17 Program: Transition of care Insurance: Medicare  Attempt #1  Telephone call to patient regarding transition of care outreach. Unable to reach patient. HIPAA compliant voice message left with call back phone number.   PLAN: RNCM will attempt 2nd telephone call to patient within 3 business days.   Quinn Plowman RN,BSN,CCM [facility] Telephonic  [phone]

## 2017-07-10 NOTE — Patient Instructions (Signed)

## 2017-07-11 DIAGNOSIS — I69351 Hemiplegia and hemiparesis following cerebral infarction affecting right dominant side: Secondary | ICD-10-CM | POA: Diagnosis not present

## 2017-07-11 DIAGNOSIS — Z7902 Long term (current) use of antithrombotics/antiplatelets: Secondary | ICD-10-CM | POA: Diagnosis not present

## 2017-07-11 DIAGNOSIS — M1990 Unspecified osteoarthritis, unspecified site: Secondary | ICD-10-CM | POA: Diagnosis not present

## 2017-07-11 DIAGNOSIS — E119 Type 2 diabetes mellitus without complications: Secondary | ICD-10-CM | POA: Diagnosis not present

## 2017-07-11 DIAGNOSIS — M47812 Spondylosis without myelopathy or radiculopathy, cervical region: Secondary | ICD-10-CM | POA: Diagnosis not present

## 2017-07-11 DIAGNOSIS — I10 Essential (primary) hypertension: Secondary | ICD-10-CM | POA: Diagnosis not present

## 2017-07-12 ENCOUNTER — Telehealth: Payer: Self-pay

## 2017-07-12 DIAGNOSIS — I69351 Hemiplegia and hemiparesis following cerebral infarction affecting right dominant side: Secondary | ICD-10-CM | POA: Diagnosis not present

## 2017-07-12 DIAGNOSIS — E119 Type 2 diabetes mellitus without complications: Secondary | ICD-10-CM | POA: Diagnosis not present

## 2017-07-12 DIAGNOSIS — M47812 Spondylosis without myelopathy or radiculopathy, cervical region: Secondary | ICD-10-CM | POA: Diagnosis not present

## 2017-07-12 DIAGNOSIS — I10 Essential (primary) hypertension: Secondary | ICD-10-CM | POA: Diagnosis not present

## 2017-07-12 DIAGNOSIS — Z7902 Long term (current) use of antithrombotics/antiplatelets: Secondary | ICD-10-CM | POA: Diagnosis not present

## 2017-07-12 DIAGNOSIS — M1990 Unspecified osteoarthritis, unspecified site: Secondary | ICD-10-CM | POA: Diagnosis not present

## 2017-07-12 NOTE — Telephone Encounter (Signed)
Erin PT with [facility] is at pts home; cked BP when got to home and pt was at rest and sitting BP 162/70 P 65 reg.  Pt walked to mail box < 200 ft and BP 190/70 P 85; after 6 mins at rest pt sitting BP 162/70. Pt had already taken diltiazem and Losartan HCTZ earlier this morning. O2 sat consistent at 98%. [facility]. Can call Junie Panning if needed.

## 2017-07-12 NOTE — Telephone Encounter (Signed)
Will they be going back out next week to check his BP?

## 2017-07-13 NOTE — Telephone Encounter (Signed)
Left detailed msg on VM per HIPAA  

## 2017-07-17 ENCOUNTER — Telehealth: Payer: Self-pay | Admitting: Internal Medicine

## 2017-07-17 ENCOUNTER — Other Ambulatory Visit: Payer: Self-pay

## 2017-07-17 ENCOUNTER — Telehealth: Payer: Self-pay | Admitting: *Deleted

## 2017-07-17 DIAGNOSIS — M47812 Spondylosis without myelopathy or radiculopathy, cervical region: Secondary | ICD-10-CM | POA: Diagnosis not present

## 2017-07-17 DIAGNOSIS — Z7902 Long term (current) use of antithrombotics/antiplatelets: Secondary | ICD-10-CM | POA: Diagnosis not present

## 2017-07-17 DIAGNOSIS — M1990 Unspecified osteoarthritis, unspecified site: Secondary | ICD-10-CM | POA: Diagnosis not present

## 2017-07-17 DIAGNOSIS — I69351 Hemiplegia and hemiparesis following cerebral infarction affecting right dominant side: Secondary | ICD-10-CM | POA: Diagnosis not present

## 2017-07-17 DIAGNOSIS — E119 Type 2 diabetes mellitus without complications: Secondary | ICD-10-CM | POA: Diagnosis not present

## 2017-07-17 DIAGNOSIS — I10 Essential (primary) hypertension: Secondary | ICD-10-CM | POA: Diagnosis not present

## 2017-07-17 NOTE — Telephone Encounter (Signed)
Erin PT with [facility] left v/m; Junie Panning saw pt today at rest before PT pts BP 162/70 P 63 ; with ambulation in driveway BP 518/34 P 75;  no CP or H/A or any other symptoms; after 2 mins rest BP 178/72 and 10 more mins rest BP 162/70. Pt had already taken all meds prior to BP readings, Any instructions or med changes call pt directly. Erin also request verbal order for [facility] OT to eval and treat for rt hand weakness.

## 2017-07-17 NOTE — Telephone Encounter (Signed)
Ok for [facility] OT. Tell Junie Panning, she is checking his BP way too much. I only need to know what it is at rest. Increase Cardizem to 180 mg 1 tab PO daily. Have him followup with me in 2 weeks.

## 2017-07-17 NOTE — Patient Outreach (Signed)
[employer] [facility]) Care Management  07/17/2017  [place] Jun 15, 1941 [id number]  Transition of care  Week # 3 Referral date: 06/25/17 Referral source: Hospital Liaison referral, status post hospital discharge on 06/19/17 to 06/23/17 Program: Transition of care Insurance: Medicare  Attempt #2  Telephone call to patient regarding transition of care outreach. Unable to reach patient. HIPAA compliant voice message left with call back phone number.   PLAN: RNCM will attempt 3rd telephone call to patient within 3 business days.   Quinn Plowman RN,BSN,CCM [facility] Telephonic  [phone]

## 2017-07-17 NOTE — Telephone Encounter (Signed)
[facility] RN is with the patient and states that the patient is reporting on the blood glucose log the blood sugars. The blood sugars are increasing . The highest it has been was 380 which was last Thurs and it has been running in the mid 200s. Elmyra Ricks is requesting a call back . Her number is [phone].Please advise .Thank you

## 2017-07-17 NOTE — Telephone Encounter (Signed)
Elmyra Ricks from [place] called regarding pts bp.  She said that it has steadily increased today it was 170/69 with a hr of 69 [phone] is her call back number.  Please call her to discuss.

## 2017-07-18 MED ORDER — DILTIAZEM HCL ER COATED BEADS 180 MG PO CP24: 180.0000 mg | ORAL_CAPSULE | Freq: Every day | ORAL | 0 refills | Status: DC

## 2017-07-18 NOTE — Telephone Encounter (Signed)
I just called HH and lmovm as well as I have already spoken to pt and new Rx sent to the pharmacy and 2 week f/u scheduled

## 2017-07-18 NOTE — Telephone Encounter (Signed)
lmovm for VO to [place] for [facility] OT... I also called pt and advised of medication change and sent Rx sent through e-scribe Also 2 week f/u appt schedule

## 2017-07-18 NOTE — Addendum Note (Signed)
Addended by: Lurlean Nanny on: 07/18/2017 11:30 AM   Modules accepted: Orders

## 2017-07-18 NOTE — Telephone Encounter (Signed)
You should already have another phone note with instruction on increasing his Diltiazem.

## 2017-07-19 ENCOUNTER — Ambulatory Visit: Payer: Self-pay

## 2017-07-19 DIAGNOSIS — I10 Essential (primary) hypertension: Secondary | ICD-10-CM | POA: Diagnosis not present

## 2017-07-19 DIAGNOSIS — M47812 Spondylosis without myelopathy or radiculopathy, cervical region: Secondary | ICD-10-CM | POA: Diagnosis not present

## 2017-07-19 DIAGNOSIS — Z7902 Long term (current) use of antithrombotics/antiplatelets: Secondary | ICD-10-CM | POA: Diagnosis not present

## 2017-07-19 DIAGNOSIS — E119 Type 2 diabetes mellitus without complications: Secondary | ICD-10-CM | POA: Diagnosis not present

## 2017-07-19 DIAGNOSIS — I69351 Hemiplegia and hemiparesis following cerebral infarction affecting right dominant side: Secondary | ICD-10-CM | POA: Diagnosis not present

## 2017-07-19 DIAGNOSIS — M1990 Unspecified osteoarthritis, unspecified site: Secondary | ICD-10-CM | POA: Diagnosis not present

## 2017-07-20 ENCOUNTER — Telehealth: Payer: Self-pay

## 2017-07-20 ENCOUNTER — Other Ambulatory Visit: Payer: Self-pay

## 2017-07-20 DIAGNOSIS — M1990 Unspecified osteoarthritis, unspecified site: Secondary | ICD-10-CM | POA: Diagnosis not present

## 2017-07-20 DIAGNOSIS — M47812 Spondylosis without myelopathy or radiculopathy, cervical region: Secondary | ICD-10-CM | POA: Diagnosis not present

## 2017-07-20 DIAGNOSIS — I69351 Hemiplegia and hemiparesis following cerebral infarction affecting right dominant side: Secondary | ICD-10-CM | POA: Diagnosis not present

## 2017-07-20 DIAGNOSIS — I10 Essential (primary) hypertension: Secondary | ICD-10-CM | POA: Diagnosis not present

## 2017-07-20 DIAGNOSIS — E119 Type 2 diabetes mellitus without complications: Secondary | ICD-10-CM | POA: Diagnosis not present

## 2017-07-20 DIAGNOSIS — Z7902 Long term (current) use of antithrombotics/antiplatelets: Secondary | ICD-10-CM | POA: Diagnosis not present

## 2017-07-20 NOTE — Patient Outreach (Signed)
[facility]) Care Management  [place]  07/20/2017   [place] [birth year] [id number]  Initial telephone assessment;  Subjective: Telephone call to patient for initial assessment. HIPAA verified.  Patient states he has been doing well other than his blood sugar. Patient states he was having issues with his blood sugars being elevated over the past few weeks. Patient states his home health nurse let his doctor know. Patient states he has since started his new diabetic medication.  Patient reports this morning blood sugar was 235 fasting.  Patient states he is scheduled to follow up with his primary MD in two weeks to see how the medication is working.  Patient states he continues to have a home health nurse 2 times per week with [place] home [facility].  Patient reports he received his [facility] care management welcome packet. RNCM requested patient sign consent form and return to office. Patient verbalized understanding.  RNCM advised patient to take his newly prescribed insulin as instructed.  Continue to monitor and record blood sugar levels as instructed. Discussed adhering to diabetic diet. Advised patient to make sure he is rotating his injection sites.  Discussed hypoglycemic symptoms with patient. Patient verbalized his action plan to hypoglycemic events. Patient states he keeps glucose tablets in home/ car, he has and carries soft peppermint candy and has peanut butter crackers he eats if needed after initial getting blood sugars up with glucose tablets or candy.  Patient advised to call his doctor for non emergent symptoms and call 911 for emergent symptoms.   Objective: see assessment  Encounter Medications:  Outpatient Encounter Prescriptions as of 07/20/2017  Medication Sig Note  . clopidogrel (PLAVIX) 75 MG tablet Take 1 tablet (75 mg total) by mouth daily.   Marland Kitchen diltiazem (DILTIAZEM CD) 180 MG 24 hr capsule Take 1 capsule (180 mg total) by mouth daily.   . folic  acid (FOLVITE) 1 MG tablet Take 1 mg by mouth every morning.    Marland Kitchen glucose blood (ONE TOUCH ULTRA TEST) test strip 1 each by Other route 2 (two) times daily. And lancets 2/day   . Insulin Glargine (LANTUS SOLOSTAR) 100 UNIT/ML Solostar Pen Inject 10 Units into the skin at bedtime. 07/20/2017: Patient states he takes 30 units of lantus at bedtime  . insulin lispro (HUMALOG KWIKPEN) 100 UNIT/ML KiwkPen Inject 0.05 mLs (5 Units total) into the skin 2 (two) times daily with a meal. Take 3 units with lunch, if you eat then 07/20/2017: Patient states he takes. Humalog 20 units 3 times per day  . Insulin Pen Needle 31G X 5 MM MISC 1 each by Does not apply route 3 (three) times daily as needed.   Marland Kitchen losartan-hydrochlorothiazide (HYZAAR) 100-25 MG tablet TAKE 1 TABLET DAILY   . methotrexate (RHEUMATREX) 2.5 MG tablet Take 20 mg by mouth every Friday.   . rosuvastatin (CRESTOR) 20 MG tablet Take 1 tablet (20 mg total) by mouth at bedtime.    No facility-administered encounter medications on file as of 07/20/2017.     Functional Status:  In your present state of health, do you have any difficulty performing the following activities: 07/20/2017 06/20/2017  Hearing? N N  Vision? N N  Difficulty concentrating or making decisions? N N  Walking or climbing stairs? N N  Dressing or bathing? N N  Doing errands, shopping? N N  Preparing Food and eating ? N -  Using the Toilet? N -  In the past six months, have you accidently  leaked urine? Y -  Do you have problems with loss of bowel control? N -  Managing your Medications? N -  Managing your Finances? N -  Housekeeping or managing your Housekeeping? N -  Some recent data might be hidden    Fall/Depression Screening: Fall Risk  07/20/2017 11/07/2016 10/20/2015  Falls in the past year? Yes No No  Number falls in past yr: 1 - -  Injury with Fall? No - -  Risk for fall due to : History of fall(s) - -  Follow up Education provided - -   PHQ 2/9 Scores 07/20/2017  11/07/2016 10/20/2015 09/14/2015  PHQ - 2 Score 0 0 1 0   THN CM Care Plan Problem One     Most Recent Value  Care Plan Problem One  Potential for readmission due to recent hospitialization for stroke  Role Documenting the Problem One  Clinical Pharmacist  Care Plan for Problem One  Active  THN Long Term Goal   Patient will report he has not been readmitted to the hospital within 31 days  [facility]N Long Term Goal Start Date  06/26/17  [facility] Long Term Goal Met Date  -- [ongoing goal]  Interventions for Problem One Long Term Goal  RNCM advised patient to keep follow up appointment with primary MD and scheduled follow up appointments with Dr. Loanne Drilling and Dr. Leonie Man  [facility] CM Short Term Goal #1   Patient will report he kept his follow up appointment with his primary MD within 2 weeks  [facility] CM Short Term Goal #1 Start Date  06/26/17  [facility] CM Short Term Goal #1 Met Date  07/20/17 [patient saw primary MD on 07/10/17]  Interventions for Short Term Goal #1  Discussed with patient importance of primary MD follow up. Advised patient to call his doctor for non emergent symptoms as needed.     [facility]N CM Care Plan Problem Two     Most Recent Value  Care Plan Problem Two  potential for elevated blood sugars   Role Documenting the Problem Two  Care Management Telephonic Coordinator  Care Plan for Problem Two  Active  [facility] CM Short Term Goal #1   patient will report his elevated blood sugars and new insulin orders to his doctor  [facility] CM Short Term Goal #1 Start Date  06/26/17  [facility] CM Short Term Goal #1 Met Date   07/20/17 [elevated bs reported by patient and home health nurse]  Interventions for Short Term Goal #2   RNCM advised patient to adhere to diabetic diet  [facility] CM Short Term Goal #2   patient will report a decrease in blood sugars with newly prescribed insulin dosages within 2 weeks.   [facility] CM Short Term Goal #2 Start Date  07/20/17  Interventions for Short Term Goal #2  RNCM advised patient to take insulin as  prescribed. Advised to report blood sugars to his doctor if no change or increase blood sugar range.   [facility] CM Short Term Goal #3   Patient will report keeping follow up appointment with hisprimary MD for 2 week re-evaluation of blood sugars   [facility] CM Short Term Goal #3 Start Date  07/20/17  Interventions for Short Term Goal #3  Patient instructed to keep follow up appointment with doctor scheduled for 08/03/17      Assessment: ongoing transition of care follow up. RNCM will extend patients case management follow up due to ongoing need for diabetic management.   Plan: RNCM will follow  up with patient within 1 week.  Patient will sign and return [facility] consent form as requested.  Patient will report keeping his follow up appointment with his primary MD within 2 weeks.  Quinn Plowman RN,BSN,CCM [facility] Telephonic  [phone]

## 2017-07-20 NOTE — Telephone Encounter (Signed)
Marlowe Kays OT with [facility] left v/m requesting verbal orders for [facility] OT 2 x a week for 5 weeks.

## 2017-07-23 DIAGNOSIS — E119 Type 2 diabetes mellitus without complications: Secondary | ICD-10-CM | POA: Diagnosis not present

## 2017-07-23 DIAGNOSIS — M1990 Unspecified osteoarthritis, unspecified site: Secondary | ICD-10-CM | POA: Diagnosis not present

## 2017-07-23 DIAGNOSIS — I69351 Hemiplegia and hemiparesis following cerebral infarction affecting right dominant side: Secondary | ICD-10-CM | POA: Diagnosis not present

## 2017-07-23 DIAGNOSIS — Z7902 Long term (current) use of antithrombotics/antiplatelets: Secondary | ICD-10-CM | POA: Diagnosis not present

## 2017-07-23 DIAGNOSIS — I10 Essential (primary) hypertension: Secondary | ICD-10-CM | POA: Diagnosis not present

## 2017-07-23 DIAGNOSIS — M47812 Spondylosis without myelopathy or radiculopathy, cervical region: Secondary | ICD-10-CM | POA: Diagnosis not present

## 2017-07-23 NOTE — Telephone Encounter (Signed)
Verbal order given to [facility] as instructed.

## 2017-07-23 NOTE — Telephone Encounter (Signed)
[place] for for [facility] OT

## 2017-07-24 DIAGNOSIS — I10 Essential (primary) hypertension: Secondary | ICD-10-CM | POA: Diagnosis not present

## 2017-07-24 DIAGNOSIS — M47812 Spondylosis without myelopathy or radiculopathy, cervical region: Secondary | ICD-10-CM | POA: Diagnosis not present

## 2017-07-24 DIAGNOSIS — M1990 Unspecified osteoarthritis, unspecified site: Secondary | ICD-10-CM | POA: Diagnosis not present

## 2017-07-24 DIAGNOSIS — E119 Type 2 diabetes mellitus without complications: Secondary | ICD-10-CM | POA: Diagnosis not present

## 2017-07-24 DIAGNOSIS — Z7902 Long term (current) use of antithrombotics/antiplatelets: Secondary | ICD-10-CM | POA: Diagnosis not present

## 2017-07-24 DIAGNOSIS — I69351 Hemiplegia and hemiparesis following cerebral infarction affecting right dominant side: Secondary | ICD-10-CM | POA: Diagnosis not present

## 2017-07-25 DIAGNOSIS — M1990 Unspecified osteoarthritis, unspecified site: Secondary | ICD-10-CM | POA: Diagnosis not present

## 2017-07-25 DIAGNOSIS — E119 Type 2 diabetes mellitus without complications: Secondary | ICD-10-CM | POA: Diagnosis not present

## 2017-07-25 DIAGNOSIS — Z7902 Long term (current) use of antithrombotics/antiplatelets: Secondary | ICD-10-CM | POA: Diagnosis not present

## 2017-07-25 DIAGNOSIS — I69351 Hemiplegia and hemiparesis following cerebral infarction affecting right dominant side: Secondary | ICD-10-CM | POA: Diagnosis not present

## 2017-07-25 DIAGNOSIS — I10 Essential (primary) hypertension: Secondary | ICD-10-CM | POA: Diagnosis not present

## 2017-07-25 DIAGNOSIS — M47812 Spondylosis without myelopathy or radiculopathy, cervical region: Secondary | ICD-10-CM | POA: Diagnosis not present

## 2017-07-26 DIAGNOSIS — M47812 Spondylosis without myelopathy or radiculopathy, cervical region: Secondary | ICD-10-CM | POA: Diagnosis not present

## 2017-07-26 DIAGNOSIS — E119 Type 2 diabetes mellitus without complications: Secondary | ICD-10-CM | POA: Diagnosis not present

## 2017-07-26 DIAGNOSIS — M1990 Unspecified osteoarthritis, unspecified site: Secondary | ICD-10-CM | POA: Diagnosis not present

## 2017-07-26 DIAGNOSIS — Z7902 Long term (current) use of antithrombotics/antiplatelets: Secondary | ICD-10-CM | POA: Diagnosis not present

## 2017-07-26 DIAGNOSIS — I69351 Hemiplegia and hemiparesis following cerebral infarction affecting right dominant side: Secondary | ICD-10-CM | POA: Diagnosis not present

## 2017-07-26 DIAGNOSIS — I10 Essential (primary) hypertension: Secondary | ICD-10-CM | POA: Diagnosis not present

## 2017-07-27 ENCOUNTER — Other Ambulatory Visit: Payer: Self-pay

## 2017-07-27 ENCOUNTER — Ambulatory Visit (INDEPENDENT_AMBULATORY_CARE_PROVIDER_SITE_OTHER): Payer: Medicare Other | Admitting: Endocrinology

## 2017-07-27 ENCOUNTER — Encounter: Payer: Self-pay | Admitting: Endocrinology

## 2017-07-27 DIAGNOSIS — I10 Essential (primary) hypertension: Secondary | ICD-10-CM | POA: Diagnosis not present

## 2017-07-27 DIAGNOSIS — M1990 Unspecified osteoarthritis, unspecified site: Secondary | ICD-10-CM | POA: Diagnosis not present

## 2017-07-27 DIAGNOSIS — E1165 Type 2 diabetes mellitus with hyperglycemia: Secondary | ICD-10-CM | POA: Diagnosis not present

## 2017-07-27 DIAGNOSIS — I639 Cerebral infarction, unspecified: Secondary | ICD-10-CM

## 2017-07-27 DIAGNOSIS — M47812 Spondylosis without myelopathy or radiculopathy, cervical region: Secondary | ICD-10-CM | POA: Diagnosis not present

## 2017-07-27 DIAGNOSIS — Z794 Long term (current) use of insulin: Secondary | ICD-10-CM | POA: Diagnosis not present

## 2017-07-27 DIAGNOSIS — I69351 Hemiplegia and hemiparesis following cerebral infarction affecting right dominant side: Secondary | ICD-10-CM | POA: Diagnosis not present

## 2017-07-27 DIAGNOSIS — Z7902 Long term (current) use of antithrombotics/antiplatelets: Secondary | ICD-10-CM | POA: Diagnosis not present

## 2017-07-27 DIAGNOSIS — E119 Type 2 diabetes mellitus without complications: Secondary | ICD-10-CM | POA: Diagnosis not present

## 2017-07-27 MED ORDER — INSULIN LISPRO 100 UNIT/ML (KWIKPEN): 30.0000 [IU] | PEN_INJECTOR | Freq: Two times a day (BID) | SUBCUTANEOUS | 11 refills | Status: DC

## 2017-07-27 MED ORDER — INSULIN GLARGINE 100 UNIT/ML SOLOSTAR PEN: 40.0000 [IU] | PEN_INJECTOR | Freq: Every day | SUBCUTANEOUS | 3 refills | Status: DC

## 2017-07-27 NOTE — Patient Instructions (Addendum)
check your blood sugar twice a day.  vary the time of day when you check, between before the 3 meals, and at bedtime.  also check if you have symptoms of your blood sugar being too high or too low.  please keep a record of the readings and bring it to your next appointment here (or you can bring the meter itself).  You can write it on any piece of paper.  please call us sooner if your blood sugar goes below 70, or if you have a lot of readings over 200. Please increase the lantus to 40 units at bedtime, and:   increase the humalog to 30 units twice a day (just before meals).  If you eat at lunchtime, take 3 units of humalog with that.  Please call or message [place] next week, to tell us how the blood sugar is doing.  With time, your insulin need will return back to what it was before you were in the hospital.  Please come back for a follow-up appointment in 1 month.

## 2017-07-27 NOTE — Patient Outreach (Signed)
[facility]) Care Management  07/27/2017  Jake Gross 1940-12-21 [id number]  Transition of care  Week # 4 Referral date: 06/25/17 Referral source: Hospital Liaison referral, status post hospital discharge on 06/19/17 to 06/23/17 Program: Transition of care Insurance: Medicare   Telephone call to patient for transition of care follow up. HIPAA verified with patient. Patient states he saw his endocrinologist today.  Patient states his blood sugars continue to be elevated. States this mornings blood sugar fasting was 252. Patient states his doctor increased his lantas and Humalog.  Patient states this is the 3rd time in 3 months his doctor has had to increase his medication. Patient states his most recent A1c was 7.5.   Patient states he is having swelling in his ankles and feet. Patient states this has been going on for approximately 2 1/2 to 3 weeks. Patient states his primary MD is aware.  Patient states his doctor thinks his blood pressure medication was the cause of the swelling. Patient states his doctor change his medication at his last appointment. Patient reports he is continuing to have the swelling in his ankles and feet. Patient states he is not having any difficulty walking, denies shortness of breath and denies having neuropathy. Patient states he has a follow up appointment with his primary MD on 08/03/17.  RNCM advised patient to take his medications as prescribed. Advised patient to contact his doctor if his symptoms worsen.  RNCM advised patient to elevate his feet while sitting.  RNCM advised patient to continue to adhere to his diabetic diet.   ASSESSMENT; ongoing transition of care follow up.  PLAN; RNCM will follow up with patient within 2 weeks.   Quinn Plowman RN,BSN,CCM [facility] Telephonic  [phone]

## 2017-07-27 NOTE — Progress Notes (Signed)
Subjective:    Patient ID: [employer], male    DOB: 08-26-1941, 76 y.o.   MRN: [id number]  HPI Pt returns for f/u of diabetes mellitus:  DM type: Insulin-requiring type 2 DM.   Dx'ed: 0240 Complications: polyneuropathy, retinopathy, CVA, and renal insufficiency.  Therapy: insulin since 2005 DKA: never Severe hypoglycemia: last episode was in 2014 Pancreatitis: never.  Pancreatic imaging: normal CT in 2006 Other: he eats 2 meals per day; he takes multiple daily injections.  Interval history: He brings a record of his cbg's which I have reviewed today.  It varies from 190-200's.  There is no trend throughout the day.  He continues to feel better since recent CVA. Past Medical History:  Diagnosis Date  . 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 Arthritis   . Cervical spondylosis   . Diabetes mellitus   . Hyperlipidemia   . Hypertension   . Stroke ([place]) 06/19/2017  . Vitamin D deficiency     Past Surgical History:  Procedure Laterality Date  . ABCESS DRAINAGE Left    buttocks  . CARDIOVASCULAR STRESS TEST  10/12/1999   EF 63%. NO ISCHEMIA  . COLONOSCOPY W/ POLYPECTOMY     5 polyps  . EUS N/A 03/11/2015   Procedure: LOWER ENDOSCOPIC ULTRASOUND (EUS);  Surgeon: Milus Banister, MD;  Location: [facility];  Service: Endoscopy;  Laterality: N/A;  . FLEXIBLE SIGMOIDOSCOPY N/A 02/02/2015   Procedure: FLEXIBLE SIGMOIDOSCOPY;  Surgeon: Inda Castle, MD;  Location: [facility];  Service: Endoscopy;  Laterality: N/A;  ERBE  . PARTIAL PROCTECTOMY BY TEM N/A 04/08/2015   Procedure: TEM PARTIAL PROCTECTOMY OF RECTAL MASS;  Surgeon: Michael Boston, MD;  Location: [facility];  Service: General;  Laterality: N/A;  . TEE WITHOUT CARDIOVERSION N/A 06/25/2017   Procedure: TRANSESOPHAGEAL ECHOCARDIOGRAM (TEE);  Surgeon: Skeet Latch, MD;  Location: [facility];  Service: Cardiovascular;  Laterality: N/A;  . TONSILLECTOMY AND  ADENOIDECTOMY     as child    Social History   Social History  . Marital status: Divorced    Spouse name: N/A  . Number of children: 2  . Years of education: N/A   Occupational History  . 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 Smoking status: Former Smoker    Types: Cigarettes    Quit date: 08/13/1994  . Smokeless tobacco: Never Used  . Alcohol use No  . Drug use: No  . Sexual activity: Not Currently   Other Topics Concern  . Not on file   Social History Narrative  . No narrative on file    Current Outpatient Prescriptions on File Prior to Visit  Medication Sig Dispense Refill  . clopidogrel (PLAVIX) 75 MG tablet Take 1 tablet (75 mg total) by mouth daily. 30 tablet 1  . diltiazem (DILTIAZEM CD) 180 MG 24 hr capsule Take 1 capsule (180 mg total) by mouth daily. 30 capsule 0  . folic acid (FOLVITE) 1 MG tablet Take 1 mg by mouth every morning.     Marland Kitchen glucose blood (ONE TOUCH ULTRA TEST) test strip 1 each by Other route 2 (two) times daily. And lancets 2/day 200 each 3  . Insulin Pen Needle 31G X 5 MM MISC 1 each by Does not apply route 3 (three) times daily as needed. 300 each 4  . losartan-hydrochlorothiazide (HYZAAR) 100-25 MG tablet TAKE 1 TABLET DAILY 90 tablet 1  . methotrexate (RHEUMATREX) 2.5 MG  tablet Take 20 mg by mouth every Friday.    . rosuvastatin (CRESTOR) 20 MG tablet Take 1 tablet (20 mg total) by mouth at bedtime. 90 tablet 1   No current facility-administered medications on file prior to visit.     No Known Allergies  Family History  Problem Relation Age of Onset  . Cancer Father        "all over"  . Coronary artery disease Brother   . Diabetes Brother   . Stroke Mother   . Diabetes Mother   . Cancer Brother   . Colon cancer Neg Hx   . Esophageal cancer Neg Hx   . Rectal cancer Neg Hx   . Stomach cancer Neg Hx     BP (!) 144/64   Pulse 62   Wt 225 lb 9.6 oz (102.3 kg)   SpO2 96%   BMI 35.33 kg/m   Review of Systems He denies  hypoglycemia    Objective:   Physical Exam VITAL SIGNS:  See vs page GENERAL: no distress Pulses: foot pulses are intact bilaterally.   MSK: no deformity of the feet or ankles.  CV: 2+ bilat edema of the legs.  Skin:  no ulcer on the feet or ankles.  normal temp on the feet and ankle, but there is hyperpigmentation of the legs.  Neuro: sensation is intact to touch on the feet and ankles.       Assessment & Plan:  Insulin-requiring type 2 DM, with CVA: he needs increased rx   Patient Instructions  check your blood sugar twice a day.  vary the time of day when you check, between before the 3 meals, and at bedtime.  also check if you have symptoms of your blood sugar being too high or too low.  please keep a record of the readings and bring it to your next appointment here (or you can bring the meter itself).  You can write it on any piece of paper.  please call us sooner if your blood sugar goes below 70, or if you have a lot of readings over 200. Please increase the lantus to 40 units at bedtime, and:   increase the humalog to 30 units twice a day (just before meals).  If you eat at lunchtime, take 3 units of humalog with that.  Please call or message [place] next week, to tell us how the blood sugar is doing.  With time, your insulin need will return back to what it was before you were in the hospital.  Please come back for a follow-up appointment in 1 month.

## 2017-07-30 DIAGNOSIS — Z7902 Long term (current) use of antithrombotics/antiplatelets: Secondary | ICD-10-CM | POA: Diagnosis not present

## 2017-07-30 DIAGNOSIS — I10 Essential (primary) hypertension: Secondary | ICD-10-CM | POA: Diagnosis not present

## 2017-07-30 DIAGNOSIS — M47812 Spondylosis without myelopathy or radiculopathy, cervical region: Secondary | ICD-10-CM | POA: Diagnosis not present

## 2017-07-30 DIAGNOSIS — I69351 Hemiplegia and hemiparesis following cerebral infarction affecting right dominant side: Secondary | ICD-10-CM | POA: Diagnosis not present

## 2017-07-30 DIAGNOSIS — M1990 Unspecified osteoarthritis, unspecified site: Secondary | ICD-10-CM | POA: Diagnosis not present

## 2017-07-30 DIAGNOSIS — E119 Type 2 diabetes mellitus without complications: Secondary | ICD-10-CM | POA: Diagnosis not present

## 2017-07-31 DIAGNOSIS — Z7902 Long term (current) use of antithrombotics/antiplatelets: Secondary | ICD-10-CM | POA: Diagnosis not present

## 2017-07-31 DIAGNOSIS — M1990 Unspecified osteoarthritis, unspecified site: Secondary | ICD-10-CM | POA: Diagnosis not present

## 2017-07-31 DIAGNOSIS — E119 Type 2 diabetes mellitus without complications: Secondary | ICD-10-CM | POA: Diagnosis not present

## 2017-07-31 DIAGNOSIS — I69351 Hemiplegia and hemiparesis following cerebral infarction affecting right dominant side: Secondary | ICD-10-CM | POA: Diagnosis not present

## 2017-07-31 DIAGNOSIS — M47812 Spondylosis without myelopathy or radiculopathy, cervical region: Secondary | ICD-10-CM | POA: Diagnosis not present

## 2017-07-31 DIAGNOSIS — I10 Essential (primary) hypertension: Secondary | ICD-10-CM | POA: Diagnosis not present

## 2017-08-01 DIAGNOSIS — I10 Essential (primary) hypertension: Secondary | ICD-10-CM | POA: Diagnosis not present

## 2017-08-01 DIAGNOSIS — M47812 Spondylosis without myelopathy or radiculopathy, cervical region: Secondary | ICD-10-CM | POA: Diagnosis not present

## 2017-08-01 DIAGNOSIS — M1990 Unspecified osteoarthritis, unspecified site: Secondary | ICD-10-CM | POA: Diagnosis not present

## 2017-08-01 DIAGNOSIS — E119 Type 2 diabetes mellitus without complications: Secondary | ICD-10-CM | POA: Diagnosis not present

## 2017-08-01 DIAGNOSIS — Z7902 Long term (current) use of antithrombotics/antiplatelets: Secondary | ICD-10-CM | POA: Diagnosis not present

## 2017-08-01 DIAGNOSIS — I69351 Hemiplegia and hemiparesis following cerebral infarction affecting right dominant side: Secondary | ICD-10-CM | POA: Diagnosis not present

## 2017-08-02 DIAGNOSIS — I10 Essential (primary) hypertension: Secondary | ICD-10-CM | POA: Diagnosis not present

## 2017-08-02 DIAGNOSIS — Z7902 Long term (current) use of antithrombotics/antiplatelets: Secondary | ICD-10-CM | POA: Diagnosis not present

## 2017-08-02 DIAGNOSIS — I69351 Hemiplegia and hemiparesis following cerebral infarction affecting right dominant side: Secondary | ICD-10-CM | POA: Diagnosis not present

## 2017-08-02 DIAGNOSIS — M47812 Spondylosis without myelopathy or radiculopathy, cervical region: Secondary | ICD-10-CM | POA: Diagnosis not present

## 2017-08-02 DIAGNOSIS — E119 Type 2 diabetes mellitus without complications: Secondary | ICD-10-CM | POA: Diagnosis not present

## 2017-08-02 DIAGNOSIS — M1990 Unspecified osteoarthritis, unspecified site: Secondary | ICD-10-CM | POA: Diagnosis not present

## 2017-08-03 ENCOUNTER — Telehealth: Payer: Self-pay | Admitting: Endocrinology

## 2017-08-03 ENCOUNTER — Encounter: Payer: Self-pay | Admitting: Internal Medicine

## 2017-08-03 ENCOUNTER — Ambulatory Visit (INDEPENDENT_AMBULATORY_CARE_PROVIDER_SITE_OTHER): Payer: Medicare Other | Admitting: Internal Medicine

## 2017-08-03 DIAGNOSIS — I639 Cerebral infarction, unspecified: Secondary | ICD-10-CM

## 2017-08-03 DIAGNOSIS — I10 Essential (primary) hypertension: Secondary | ICD-10-CM

## 2017-08-03 MED ORDER — ROSUVASTATIN CALCIUM 20 MG PO TABS: 20.0000 mg | ORAL_TABLET | Freq: Every day | ORAL | 1 refills | Status: DC

## 2017-08-03 NOTE — Patient Instructions (Signed)

## 2017-08-03 NOTE — Progress Notes (Signed)
Subjective:    Patient ID: [employer], male    DOB: [birth year], 76 y.o.   MRN: [id number]  HPI  Pt presents to the clinic today for follow up of HTN. At his last visit, he was started on Cardizem in addition to his Losartan HCT. He has been taking the medication as prescribed. His BP today is 138/74. ECG from 06/2017 reviewed.  Review of Systems  Past Medical History:  Diagnosis Date  . 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 Arthritis   . Cervical spondylosis   . Diabetes mellitus   . Hyperlipidemia   . Hypertension   . Stroke ([place]) 06/19/2017  . Vitamin D deficiency     Current Outpatient Prescriptions  Medication Sig Dispense Refill  . clopidogrel (PLAVIX) 75 MG tablet Take 1 tablet (75 mg total) by mouth daily. 30 tablet 1  . diltiazem (DILTIAZEM CD) 180 MG 24 hr capsule Take 1 capsule (180 mg total) by mouth daily. 30 capsule 0  . folic acid (FOLVITE) 1 MG tablet Take 1 mg by mouth every morning.     Marland Kitchen glucose blood (ONE TOUCH ULTRA TEST) test strip 1 each by Other route 2 (two) times daily. And lancets 2/day 200 each 3  . Insulin Glargine (LANTUS SOLOSTAR) 100 UNIT/ML Solostar Pen Inject 40 Units into the skin at bedtime. 5 pen 3  . insulin lispro (HUMALOG KWIKPEN) 100 UNIT/ML KiwkPen Inject 0.3 mLs (30 Units total) into the skin 2 (two) times daily with a meal. Take 3 units with lunch, if you eat then 10 pen 11  . Insulin Pen Needle 31G X 5 MM MISC 1 each by Does not apply route 3 (three) times daily as needed. 300 each 4  . losartan-hydrochlorothiazide (HYZAAR) 100-25 MG tablet TAKE 1 TABLET DAILY 90 tablet 1  . methotrexate (RHEUMATREX) 2.5 MG tablet Take 20 mg by mouth every Friday.    . rosuvastatin (CRESTOR) 20 MG tablet Take 1 tablet (20 mg total) by mouth at bedtime. 90 tablet 1   No current facility-administered medications for this visit.     No Known Allergies  Family History  Problem Relation Age of  Onset  . Cancer Father        "all over"  . Coronary artery disease Brother   . Diabetes Brother   . Stroke Mother   . Diabetes Mother   . Cancer Brother   . Colon cancer Neg Hx   . Esophageal cancer Neg Hx   . Rectal cancer Neg Hx   . Stomach cancer Neg Hx     Social History   Social History  . Marital status: Divorced    Spouse name: N/A  . Number of children: 2  . Years of education: N/A   Occupational History  . 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 Smoking status: Former Smoker    Types: Cigarettes    Quit date: 08/13/1994  . Smokeless tobacco: Never Used  . Alcohol use No  . Drug use: No  . Sexual activity: Not Currently   Other Topics Concern  . Not on file   Social History Narrative  . No narrative on file     Constitutional: Denies fever, malaise, fatigue, headache or abrupt weight changes.  Respiratory: Denies difficulty breathing, shortness of breath, cough or sputum production.   Cardiovascular: Pt reports swelling in legs. Denies chest pain, chest tightness, palpitations or swelling in  the hands.  Neurological: Denies dizziness, difficulty with memory, difficulty with speech or problems with balance and coordination.    No other specific complaints in a complete review of systems (except as listed in HPI above).     Objective:   Physical Exam  BP 138/74   Pulse 61   Temp 98 F (36.7 C) (Oral)   Wt 227 lb (103 kg)   SpO2 97%   BMI 35.55 kg/m  Wt Readings from Last 3 Encounters:  08/03/17 227 lb (103 kg)  07/27/17 225 lb 9.6 oz (102.3 kg)  07/20/17 215 lb (97.5 kg)    General: Appears his stated age, obese in NAD. Cardiovascular: Normal rate and rhythm. S1,S2 noted. Occasional ectopic beat noted. No murmur, rubs or gallops noted. 1+ pitting BLE edema.  Pulmonary/Chest: Normal effort and positive vesicular breath sounds. No respiratory distress. No wheezes, rales or ronchi noted.  Neurological: Alert and oriented.    BMET      Component Value Date/Time   NA 139 06/22/2017 0516   NA 141 01/19/2016   K 4.1 06/22/2017 0516   CL 108 06/22/2017 0516   CO2 25 06/22/2017 0516   GLUCOSE 150 (H) 06/22/2017 0516   BUN 22 (H) 06/22/2017 0516   BUN 34 (A) 01/19/2016   CREATININE 1.04 06/22/2017 0516   CREATININE 1.14 11/07/2016 1514   CALCIUM 8.9 06/22/2017 0516   GFRNONAA >60 06/22/2017 0516   GFRAA >60 06/22/2017 0516    Lipid Panel     Component Value Date/Time   CHOL 113 06/22/2017 0516   TRIG 172 (H) 06/22/2017 0516   HDL 23 (L) 06/22/2017 0516   CHOLHDL 4.9 06/22/2017 0516   VLDL 34 06/22/2017 0516   LDLCALC 56 06/22/2017 0516    CBC    Component Value Date/Time   WBC 5.9 06/22/2017 0516   RBC 4.39 06/22/2017 0516   HGB 12.8 (L) 06/22/2017 0516   HCT 38.7 (L) 06/22/2017 0516   PLT 174 06/22/2017 0516   MCV 88.2 06/22/2017 0516   MCH 29.2 06/22/2017 0516   MCHC 33.1 06/22/2017 0516   RDW 14.4 06/22/2017 0516   LYMPHSABS 2.3 06/22/2017 0516   MONOABS 0.7 06/22/2017 0516   EOSABS 0.3 06/22/2017 0516   BASOSABS 0.0 06/22/2017 0516    Hgb A1C Lab Results  Component Value Date   HGBA1C 7.3 (H) 06/20/2017           Assessment & Plan:

## 2017-08-03 NOTE — Telephone Encounter (Signed)
Patient is calling with b/s readings

## 2017-08-03 NOTE — Telephone Encounter (Signed)
CBG"s:   Morning Night 10/12    121 10/13  169  124 10/14  165  116 10/15  133  138 10/16  181  217 10/17  238  181 10/18  204  188 10/19  167

## 2017-08-03 NOTE — Assessment & Plan Note (Signed)
Controlled on Losartan HCT and Cardizem No changes needed at this time

## 2017-08-03 NOTE — Telephone Encounter (Signed)
Please increase the lantus to 50 units at bedtime, and:   Continue the same humalog I'll see you next time.

## 2017-08-03 NOTE — Telephone Encounter (Signed)
Called patient & he wasn't at home so I asked if when he called back him tell the office to transfer him back. I don't want to play phone tag & would rather go ahead & get readings directly.

## 2017-08-06 ENCOUNTER — Telehealth: Payer: Self-pay

## 2017-08-06 ENCOUNTER — Other Ambulatory Visit: Payer: Self-pay | Admitting: Internal Medicine

## 2017-08-06 DIAGNOSIS — I69351 Hemiplegia and hemiparesis following cerebral infarction affecting right dominant side: Secondary | ICD-10-CM | POA: Diagnosis not present

## 2017-08-06 DIAGNOSIS — M47812 Spondylosis without myelopathy or radiculopathy, cervical region: Secondary | ICD-10-CM | POA: Diagnosis not present

## 2017-08-06 DIAGNOSIS — M1990 Unspecified osteoarthritis, unspecified site: Secondary | ICD-10-CM | POA: Diagnosis not present

## 2017-08-06 DIAGNOSIS — I10 Essential (primary) hypertension: Secondary | ICD-10-CM | POA: Diagnosis not present

## 2017-08-06 DIAGNOSIS — Z7902 Long term (current) use of antithrombotics/antiplatelets: Secondary | ICD-10-CM | POA: Diagnosis not present

## 2017-08-06 DIAGNOSIS — E119 Type 2 diabetes mellitus without complications: Secondary | ICD-10-CM | POA: Diagnosis not present

## 2017-08-06 NOTE — Telephone Encounter (Signed)
Called and advised of MD note, patient understood, no questions.

## 2017-08-07 ENCOUNTER — Other Ambulatory Visit: Payer: Self-pay

## 2017-08-07 ENCOUNTER — Encounter: Payer: Self-pay | Admitting: Neurology

## 2017-08-07 ENCOUNTER — Ambulatory Visit (INDEPENDENT_AMBULATORY_CARE_PROVIDER_SITE_OTHER): Payer: Medicare Other | Admitting: Neurology

## 2017-08-07 VITALS — BP 163/81 | HR 60 | Ht 67.0 in | Wt 232.8 lb

## 2017-08-07 DIAGNOSIS — I10 Essential (primary) hypertension: Secondary | ICD-10-CM | POA: Diagnosis not present

## 2017-08-07 DIAGNOSIS — M47812 Spondylosis without myelopathy or radiculopathy, cervical region: Secondary | ICD-10-CM | POA: Diagnosis not present

## 2017-08-07 DIAGNOSIS — I69351 Hemiplegia and hemiparesis following cerebral infarction affecting right dominant side: Secondary | ICD-10-CM | POA: Diagnosis not present

## 2017-08-07 DIAGNOSIS — Z7902 Long term (current) use of antithrombotics/antiplatelets: Secondary | ICD-10-CM | POA: Diagnosis not present

## 2017-08-07 DIAGNOSIS — I639 Cerebral infarction, unspecified: Secondary | ICD-10-CM

## 2017-08-07 DIAGNOSIS — M1990 Unspecified osteoarthritis, unspecified site: Secondary | ICD-10-CM | POA: Diagnosis not present

## 2017-08-07 DIAGNOSIS — E119 Type 2 diabetes mellitus without complications: Secondary | ICD-10-CM | POA: Diagnosis not present

## 2017-08-07 NOTE — Patient Instructions (Signed)
I had a long d/w patient about his recent cryptogenic stroke, risk for recurrent stroke/TIAs, personally independently reviewed imaging studies and stroke evaluation results and answered questions.Continue Plavix  for secondary stroke prevention and maintain strict control of hypertension with blood pressure goal below 130/90, diabetes with hemoglobin A1c goal below 6.5% and lipids with LDL cholesterol goal below 70 mg/dL.the patient was supposed to have outpatient loop recorder but for some reason this has not been done. We will try to get this arranged I also advised the patient to eat a healthy diet with plenty of whole grains, cereals, fruits and vegetables, exercise regularly and maintain ideal body weight Followup in the future with my nurse practitioner in 6 months or call earlier if necessary. Stroke Prevention Some medical conditions and behaviors are associated with an increased chance of having a stroke. You may prevent a stroke by making healthy choices and managing medical conditions. How can I reduce my risk of having a stroke?  Stay physically active. Get at least 30 minutes of activity on most or all days.  Do not smoke. It may also be helpful to avoid exposure to secondhand smoke.  Limit alcohol use. Moderate alcohol use is considered to be: ? No more than 2 drinks per day for men. ? No more than 1 drink per day for nonpregnant women.  Eat healthy foods. This involves: ? Eating 5 or more servings of fruits and vegetables a day. ? Making dietary changes that address high blood pressure (hypertension), high cholesterol, diabetes, or obesity.  Manage your cholesterol levels. ? Making food choices that are high in fiber and low in saturated fat, trans fat, and cholesterol may control cholesterol levels. ? Take any prescribed medicines to control cholesterol as directed by your health care provider.  Manage your diabetes. ? Controlling your carbohydrate and sugar intake is  recommended to manage diabetes. ? Take any prescribed medicines to control diabetes as directed by your health care provider.  Control your hypertension. ? Making food choices that are low in salt (sodium), saturated fat, trans fat, and cholesterol is recommended to manage hypertension. ? Ask your health care provider if you need treatment to lower your blood pressure. Take any prescribed medicines to control hypertension as directed by your health care provider. ? If you are 75-73 years of age, have your blood pressure checked every 3-5 years. If you are 52 years of age or older, have your blood pressure checked every year.  Maintain a healthy weight. ? Reducing calorie intake and making food choices that are low in sodium, saturated fat, trans fat, and cholesterol are recommended to manage weight.  Stop drug abuse.  Avoid taking birth control pills. ? Talk to your health care provider about the risks of taking birth control pills if you are over 26 years old, smoke, get migraines, or have ever had a blood clot.  Get evaluated for sleep disorders (sleep apnea). ? Talk to your health care provider about getting a sleep evaluation if you snore a lot or have excessive sleepiness.  Take medicines only as directed by your health care provider. ? For some people, aspirin or blood thinners (anticoagulants) are helpful in reducing the risk of forming abnormal blood clots that can lead to stroke. If you have the irregular heart rhythm of atrial fibrillation, you should be on a blood thinner unless there is a good reason you cannot take them. ? Understand all your medicine instructions.  Make sure that other conditions (such as  anemia or atherosclerosis) are addressed. Get help right away if:  You have sudden weakness or numbness of the face, arm, or leg, especially on one side of the body.  Your face or eyelid droops to one side.  You have sudden confusion.  You have trouble speaking  (aphasia) or understanding.  You have sudden trouble seeing in one or both eyes.  You have sudden trouble walking.  You have dizziness.  You have a loss of balance or coordination.  You have a sudden, severe headache with no known cause.  You have new chest pain or an irregular heartbeat. Any of these symptoms may represent a serious problem that is an emergency. Do not wait to see if the symptoms will go away. Get medical help at once. Call your local emergency services (911 in [place] Do not drive yourself to the hospital. This information is not intended to replace advice given to you by your health care provider. Make sure you discuss any questions you have with your health care provider. Document Released: 11/09/2004 Document Revised: 03/09/2016 Document Reviewed: 04/04/2013 Elsevier Interactive Patient Education  2017 [employer].

## 2017-08-07 NOTE — Patient Outreach (Signed)
[place] [facility]) Care Management  08/07/2017  Jake Gross April 30, 1941 [id number]   Telephone call to patient for assessment follow up. HIPAA verified. Patient states his endocrinologist increased his lantas by 10 units. Patient states he is taking 50 units at night time. Patient reports his humalog insulin dosage remains the same. Patient states he can tell a slight decrease in his blood sugars. Patient reports this morning blood sugar was 238 fasting. Patient reports his  Evening blood sugars have come down slightly. Patient states his evening blood sugars having been ranging from 140 to 190.  Patient states he continues to have the nurse with [facility] home health see him weekly. Patient states the nurse is working with him regarding his diabetes. Patient states the home health nurse has ordered education material for him on diabetes but it has still not arrived.  Patient verbally agreeable to received EMMI education material on diabetes from this RNCM.  RNCM discussed with patient diet management for his diabetes. Patient states he eats mainly fruits and vegetables. Patient states he does not eat much meat mostly [place] sandwiches. Patient states he does not eat much.  Patient reports he eats [place] sandwiches for lunch and 2 egg omelet with Corona Popovich pepper and onions for breakfast. Patient states he drinks V8 juices or tea. Patient states he uses splenda in his tea.  Patient states he is still having swelling in his feet and ankles. Patient states he saw his primary MD last week who is aware of the swelling. Patient denies any changes in his treatment at primary MD appointment. Patient reports he cannot tell he has ankle bones due to the swelling in his ankles. Patient denies his walking being affected. RNCM advised patient to continue to elevate his feet/ legs when sitting extremely tight foot wear.  Patient states the therapist with [facility] continues to work with him regarding his hand.   Patient states he is scheduled to see the neurologist today for follow up.    ASSESSMENT: Ongoing diabetes education and management  PLAN; RNCM will follow up with patient in the month of November 2018. RNCM will send patient EMMI education material on diabetes.

## 2017-08-07 NOTE — Progress Notes (Signed)
[facility] [address] [place]. [place] [address] [phone]       OFFICE FOLLOW-UP NOTE  [name] Date of Birth:  09/22/1941 Medical Record Number:  [id number]   HPI: [name] is a pleasant 76 year old Caucasian male seen today for the first office follow-up visit following hospital admission for stroke in September 2018. History is obtained from the patient, review of electronic medical records and have personally reviewed imaging films.Jake Gross an 76 y.o.malewith hypertension, hyperlipidemia, RA, and diabetes presenting by [facility] to the ED for sudden-onset R-sided weakness.  He attempted to stand up and fell.  MRI showed a left parietal stroke. Patient was admitted and stroke workup was initiated. Date last known well: Date: 06/20/2017 Time last known well: Time: 13:00 NIHSSstroke scale of 0.Patient was not administered IV t-PA secondary to arriving outside of the tPA treatment window. He was admitted to General Neurology for further evaluation and treatment. MRI scan of the brain showed posterior left insular and superior parietal cortex infarct extending into the subcortical white matter. MRA of the brain showed no large vessel stenosis or occlusion. Carotid Doppler showed no significant extracranial stenosis. Transthoracic echo showed normal ejection fraction. LDL cholesterol 56 g percent and hemoglobin A1c was elevated at 7.6. Patient was started on Plavix for stroke prevention. He had outpatient transesophageal echocardiogram done which showed no Growth of embolism. Plan was to have outpatient loop recorder inserted but due to some misunderstanding that has not happened. The patient does recall there was a phone call from cardiology to call them but he did not call them. Patient has noticed improvement in his right hand strength. Still getting home occupational and physical therapy. His block walking and gait have improved back to baseline. He  still has some diminished fine motor skills and weakness in the right hand. He states his sugars are not yet well controlled and fasting sugar recently wasn't greater than 200. His Lantus insulin dose has been increased recently by primary physician. His blood pressure is also been running high in the dose of his Cardizem was also increased from 120 to 180mg  recently. He is tolerating Crestor well without muscle aches and pains. He has not yet had outpatient loop recorder done. He is tolerating Plavix well without bruising or bleeding.   ROS:   14 system review of systems is positive for  Leg swelling, joint swelling, aching muscles, and weakness and all other systems negative PMH:  Past Medical History:  Diagnosis Date  . 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 Arthritis   . Cervical spondylosis   . Diabetes mellitus   . Hyperlipidemia   . Hypertension   . Stroke ([place]) 06/19/2017  . Vitamin D deficiency     Social History:  Social History   Social History  . Marital status: Divorced    Spouse name: N/A  . Number of children: 2  . Years of education: N/A   Occupational History  . 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 Smoking status: Former Smoker    Types: Cigarettes    Quit date: 08/13/1994  . Smokeless tobacco: Never Used  . Alcohol use No  . Drug use: No  . Sexual activity: Not Currently   Other Topics Concern  . Not on file   Social History Narrative  . No narrative on file    Medications:   Current Outpatient Prescriptions on File Prior to  Visit  Medication Sig Dispense Refill  . clopidogrel (PLAVIX) 75 MG tablet Take 1 tablet (75 mg total) by mouth daily. 30 tablet 1  . diltiazem (CARDIZEM CD) 120 MG 24 hr capsule TAKE 1 CAPSULE BY MOUTH EVERY DAY 30 capsule 0  . diltiazem (DILTIAZEM CD) 180 MG 24 hr capsule Take 1 capsule (180 mg total) by mouth daily. 30 capsule 0  . folic acid (FOLVITE) 1 MG tablet  Take 1 mg by mouth every morning.     Marland Kitchen glucose blood (ONE TOUCH ULTRA TEST) test strip 1 each by Other route 2 (two) times daily. And lancets 2/day 200 each 3  . Insulin Glargine (LANTUS SOLOSTAR) 100 UNIT/ML Solostar Pen Inject 40 Units into the skin at bedtime. (Patient taking differently: Inject 50 Units into the skin at bedtime. ) 5 pen 3  . insulin lispro (HUMALOG KWIKPEN) 100 UNIT/ML KiwkPen Inject 0.3 mLs (30 Units total) into the skin 2 (two) times daily with a meal. Take 3 units with lunch, if you eat then 10 pen 11  . Insulin Pen Needle 31G X 5 MM MISC 1 each by Does not apply route 3 (three) times daily as needed. 300 each 4  . losartan-hydrochlorothiazide (HYZAAR) 100-25 MG tablet TAKE 1 TABLET DAILY 90 tablet 1  . methotrexate (RHEUMATREX) 2.5 MG tablet Take 20 mg by mouth every Friday.    . rosuvastatin (CRESTOR) 20 MG tablet Take 1 tablet (20 mg total) by mouth at bedtime. 90 tablet 1   No current facility-administered medications on file prior to visit.     Allergies:  No Known Allergies  Physical Exam General: well developed, well nourished, seated, in no evident distress Head: head normocephalic and atraumatic.  Neck: supple with no carotid or supraclavicular bruits Cardiovascular: regular rate and rhythm, no murmurs Musculoskeletal: no deformity Skin:  no rash/petichiae Vascular:  Normal pulses all extremities Vitals:   08/07/17 1306  BP: (!) 163/81  Pulse: 60   Neurologic Exam Mental Status: Awake and fully alert. Oriented to place and time. Recent and remote memory intact. Attention span, concentration and fund of knowledge appropriate. Mood and affect appropriate.  Cranial Nerves: Fundoscopic exam reveals sharp disc margins. Pupils equal, briskly reactive to light. Extraocular movements full without nystagmus. Visual fields full to confrontation. Hearing intact. Facial sensation intact. Face, tongue, palate moves normally and symmetrically.  Motor: Normal bulk and  tone. Normal strength in all tested extremity muscles.diminished fine finger movements in the right hand. Orbits left over right upper extremity. Sensory.: intact to touch ,pinprick .position sensation but bilateral diminished vibratory sensation.from ankle down  Coordination: Rapid alternating movements normal in all extremities. Finger-to-nose and heel-to-shin performed accurately bilaterally. Gait and Station: Arises from chair without difficulty. Stance is normal. Gait demonstrates normal stride length and balance . Able to heel, toe and tandem walk with moderate difficulty.  Reflexes: 1+ and symmetric except both ankle jerks are depressed. Toes downgoing.   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 Multiple vascular risk factors of hypertension, diabetes, hyperlipidemia and obesity.    PLAN: I had a long d/w patient about his recent cryptogenic stroke, risk for recurrent stroke/TIAs, personally independently reviewed imaging studies and stroke evaluation results and answered questions.Continue Plavix  for secondary stroke prevention and maintain strict control of hypertension with blood pressure goal below 130/90, diabetes with hemoglobin A1c goal below 6.5% and lipids with  LDL cholesterol goal below 70 mg/dL.the patient was supposed to have outpatient loop recorder but for some reason this has not been done. We will try to get this arranged I also advised the patient to eat a healthy diet with plenty of whole grains, cereals, fruits and vegetables, exercise regularly and maintain ideal body weight Followup in the future with my nurse practitioner in 6 months or call earlier if necessary. Greater than 50% of time during this 25 minute visit was spent on counseling,explanation of diagnosis of cryptogenic stroke, planning of further management, discussion with patient and family and  coordination of care Antony Contras, MD  [facility] [address] [place], [place] [address]  Phone [phone] Fax [phone] Note: This document was prepared with digital dictation and possible smart phrase technology. Any transcriptional errors that result from this process are unintentional

## 2017-08-08 DIAGNOSIS — I10 Essential (primary) hypertension: Secondary | ICD-10-CM | POA: Diagnosis not present

## 2017-08-08 DIAGNOSIS — M47812 Spondylosis without myelopathy or radiculopathy, cervical region: Secondary | ICD-10-CM | POA: Diagnosis not present

## 2017-08-08 DIAGNOSIS — I69351 Hemiplegia and hemiparesis following cerebral infarction affecting right dominant side: Secondary | ICD-10-CM | POA: Diagnosis not present

## 2017-08-08 DIAGNOSIS — M1990 Unspecified osteoarthritis, unspecified site: Secondary | ICD-10-CM | POA: Diagnosis not present

## 2017-08-08 DIAGNOSIS — E119 Type 2 diabetes mellitus without complications: Secondary | ICD-10-CM | POA: Diagnosis not present

## 2017-08-08 DIAGNOSIS — Z7902 Long term (current) use of antithrombotics/antiplatelets: Secondary | ICD-10-CM | POA: Diagnosis not present

## 2017-08-09 DIAGNOSIS — M1990 Unspecified osteoarthritis, unspecified site: Secondary | ICD-10-CM | POA: Diagnosis not present

## 2017-08-09 DIAGNOSIS — I69351 Hemiplegia and hemiparesis following cerebral infarction affecting right dominant side: Secondary | ICD-10-CM | POA: Diagnosis not present

## 2017-08-09 DIAGNOSIS — E119 Type 2 diabetes mellitus without complications: Secondary | ICD-10-CM | POA: Diagnosis not present

## 2017-08-09 DIAGNOSIS — I10 Essential (primary) hypertension: Secondary | ICD-10-CM | POA: Diagnosis not present

## 2017-08-09 DIAGNOSIS — M47812 Spondylosis without myelopathy or radiculopathy, cervical region: Secondary | ICD-10-CM | POA: Diagnosis not present

## 2017-08-09 DIAGNOSIS — Z7902 Long term (current) use of antithrombotics/antiplatelets: Secondary | ICD-10-CM | POA: Diagnosis not present

## 2017-08-13 ENCOUNTER — Encounter: Payer: Self-pay | Admitting: Internal Medicine

## 2017-08-13 ENCOUNTER — Ambulatory Visit (INDEPENDENT_AMBULATORY_CARE_PROVIDER_SITE_OTHER): Payer: Medicare Other | Admitting: Internal Medicine

## 2017-08-13 ENCOUNTER — Encounter (INDEPENDENT_AMBULATORY_CARE_PROVIDER_SITE_OTHER): Payer: Self-pay

## 2017-08-13 VITALS — BP 148/62 | HR 61 | Ht 67.0 in | Wt 231.0 lb

## 2017-08-13 DIAGNOSIS — I639 Cerebral infarction, unspecified: Secondary | ICD-10-CM

## 2017-08-13 DIAGNOSIS — M1990 Unspecified osteoarthritis, unspecified site: Secondary | ICD-10-CM | POA: Diagnosis not present

## 2017-08-13 DIAGNOSIS — M47812 Spondylosis without myelopathy or radiculopathy, cervical region: Secondary | ICD-10-CM | POA: Diagnosis not present

## 2017-08-13 DIAGNOSIS — I69351 Hemiplegia and hemiparesis following cerebral infarction affecting right dominant side: Secondary | ICD-10-CM | POA: Diagnosis not present

## 2017-08-13 DIAGNOSIS — E119 Type 2 diabetes mellitus without complications: Secondary | ICD-10-CM | POA: Diagnosis not present

## 2017-08-13 DIAGNOSIS — I10 Essential (primary) hypertension: Secondary | ICD-10-CM | POA: Diagnosis not present

## 2017-08-13 DIAGNOSIS — Z7902 Long term (current) use of antithrombotics/antiplatelets: Secondary | ICD-10-CM | POA: Diagnosis not present

## 2017-08-13 NOTE — Patient Instructions (Addendum)
Medication Instructions:  Your physician recommends that you continue on your current medications as directed. Please refer to the Current Medication list given to you today.  -- If you need a refill on your cardiac medications before your next appointment, please call your pharmacy. --  Labwork: None ordered  Testing/Procedures:  LINQ Implant on 08/14/2017    Please arrive at [facility] at 11:30 am  Follow-Up:  Your physician wants you to follow-up 10-14 days in device clinic for wound check and as needed with Dr. Rayann Heman.     Thank you for choosing [facility]!!   Frederik Schmidt, RN [phone]  Any Other Special Instructions Will Be Listed Below (If Applicable).

## 2017-08-13 NOTE — Progress Notes (Signed)
ELECTROPHYSIOLOGY CONSULT NOTE  Patient ID: Jake Gross MRN: [id number], DOB/AGE: 1941-01-14   Primary Physician: Jake Fenton, NP   Reason for Consultation: Cryptogenic stroke; recommendations regarding Implantable Loop Recorder  History of Present Illness Jake Gross was admitted September 2018 with acute CVA.  MRI revealed L parietal stroke.   he has been monitored on telemetry which has demonstrated no arrhythmias. No cause has been identified. Inpatient stroke work-up including TEE were unrevealing.  He is making slow but steady recovery. EP has been asked to evaluate for placement of an implantable loop recorder to monitor for atrial fibrillation.  Past Medical History Past Medical History:  Diagnosis Date  . 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 Arthritis   . Cervical spondylosis   . Diabetes mellitus   . Hyperlipidemia   . Hypertension   . Stroke ([place]) 06/19/2017  . Vitamin D deficiency     Past Surgical History Past Surgical History:  Procedure Laterality Date  . ABCESS DRAINAGE Left    buttocks  . CARDIOVASCULAR STRESS TEST  10/12/1999   EF 63%. NO ISCHEMIA  . COLONOSCOPY W/ POLYPECTOMY     5 polyps  . EUS N/A 03/11/2015   Procedure: LOWER ENDOSCOPIC ULTRASOUND (EUS);  Surgeon: Milus Banister, MD;  Location: [facility];  Service: Endoscopy;  Laterality: N/A;  . FLEXIBLE SIGMOIDOSCOPY N/A 02/02/2015   Procedure: FLEXIBLE SIGMOIDOSCOPY;  Surgeon: Inda Castle, MD;  Location: [facility];  Service: Endoscopy;  Laterality: N/A;  ERBE  . PARTIAL PROCTECTOMY BY TEM N/A 04/08/2015   Procedure: TEM PARTIAL PROCTECTOMY OF RECTAL MASS;  Surgeon: Michael Boston, MD;  Location: [facility];  Service: General;  Laterality: N/A;  . TEE WITHOUT CARDIOVERSION N/A 06/25/2017   Procedure: TRANSESOPHAGEAL ECHOCARDIOGRAM (TEE);  Surgeon: Skeet Latch, MD;  Location: [facility];  Service: Cardiovascular;  Laterality:  N/A;  . TONSILLECTOMY AND ADENOIDECTOMY     as child    Allergies/Intolerances No Known Allergies  Medicines are reviewed  The patient has a family history of HTN.  Social History Social History   Social History  . Marital status: Divorced    Spouse name: N/A  . Number of children: 2  . Years of education: N/A   Occupational History  . 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 Smoking status: Former Smoker    Types: Cigarettes    Quit date: 08/13/1994  . Smokeless tobacco: Never Used  . Alcohol use No  . Drug use: No  . Sexual activity: Not Currently   Other Topics Concern  . Not on file   Social History Narrative  . No narrative on file    Review of Systems General: No chills, fever, night sweats or weight changes  Cardiovascular:  No chest pain, dyspnea on exertion, edema, orthopnea, palpitations, paroxysmal nocturnal dyspnea Dermatological: No rash, lesions or masses Respiratory: No cough, dyspnea Urologic: No hematuria, dysuria Abdominal: No nausea, vomiting, diarrhea, bright red blood per rectum, melena, or hematemesis Neurologic: No visual changes, weakness, changes in mental status All other systems reviewed and are otherwise negative except as noted above.  Physical Exam Blood pressure (!) 148/62, pulse 61, height 5\' 7"  (1.702 m), weight 231 lb (104.8 kg), SpO2 95 %.  General: Well developed, well appearing 76 y.o. male in no acute distress. HEENT: Normocephalic, atraumatic. EOMs intact. Sclera nonicteric. Oropharynx clear.  Neck: Supple without bruits. No JVD. Lungs: Respirations regular and unlabored,  CTA bilaterally. No wheezes, rales or rhonchi. Heart: RRR. S1, S2 present. No murmurs, rub, S3 or S4. Abdomen: Soft, non-tender, non-distended. BS present x 4 quadrants. No hepatosplenomegaly.  Extremities: No clubbing, cyanosis or edema. DP/PT/Radials 2+ and equal bilaterally. Psych: Normal affect. Neuro: Alert and oriented X 3. Moves all  extremities spontaneously.  R hand moves with mild contracture Musculoskeletal: No kyphosis. Skin: Intact. Warm and dry. No rashes or petechiae in exposed areas.   Labs Lab Results  Component Value Date   WBC 5.9 06/22/2017   HGB 12.8 (L) 06/22/2017   HCT 38.7 (L) 06/22/2017   MCV 88.2 06/22/2017   PLT 174 06/22/2017   No results for input(s): NA, K, CL, CO2, BUN, CREATININE, CALCIUM, PROT, BILITOT, ALKPHOS, ALT, AST, GLUCOSE in the last 168 hours.  Invalid input(s): LABALBU No results for input(s): INR in the last 72 hours.  Radiology/Studies No results found.  TEE reviewed  12-lead ECG sinus rhythm   Assessment and Plan 1. Cryptogenic stroke  The patient has cryptogenic stroke.  I have reviewed Dr Clydene Fake office note.  We agree that implantable loop recorder insertion to monitor for AF is appropriate. The indication for loop recorder insertion / monitoring for AF in setting of cryptogenic stroke was discussed with the patient. The loop recorder insertion procedure was reviewed in detail including risks and benefits. These risks include but are not limited to bleeding and infection. The patient expressed verbal understanding and agrees to proceed. The patient was also counseled regarding wound care and device follow-up.  We will schedule the procedure at the next available time.  Army Fossa MD 08/13/2017, 3:46 PM

## 2017-08-14 ENCOUNTER — Encounter (HOSPITAL_COMMUNITY): Payer: Self-pay | Admitting: Internal Medicine

## 2017-08-14 ENCOUNTER — Encounter (HOSPITAL_COMMUNITY)
Admission: RE | Disposition: A | Discharge status: Home / Self Care | Payer: Self-pay | Source: Ambulatory Visit | Attending: Internal Medicine

## 2017-08-14 ENCOUNTER — Ambulatory Visit (HOSPITAL_COMMUNITY)
Admission: RE | Admit: 2017-08-14 | Discharge: 2017-08-14 | Disposition: A | Discharge status: Home / Self Care | Payer: Medicare Other | Source: Ambulatory Visit | Attending: Internal Medicine | Admitting: Internal Medicine

## 2017-08-14 ENCOUNTER — Other Ambulatory Visit: Payer: Self-pay | Admitting: Internal Medicine

## 2017-08-14 DIAGNOSIS — I639 Cerebral infarction, unspecified: Secondary | ICD-10-CM | POA: Diagnosis not present

## 2017-08-14 DIAGNOSIS — I10 Essential (primary) hypertension: Secondary | ICD-10-CM | POA: Diagnosis not present

## 2017-08-14 DIAGNOSIS — M1990 Unspecified osteoarthritis, unspecified site: Secondary | ICD-10-CM | POA: Insufficient documentation

## 2017-08-14 DIAGNOSIS — Z87891 Personal history of nicotine dependence: Secondary | ICD-10-CM | POA: Diagnosis not present

## 2017-08-14 DIAGNOSIS — E785 Hyperlipidemia, unspecified: Secondary | ICD-10-CM | POA: Insufficient documentation

## 2017-08-14 DIAGNOSIS — Z8249 Family history of ischemic heart disease and other diseases of the circulatory system: Secondary | ICD-10-CM | POA: Diagnosis not present

## 2017-08-14 DIAGNOSIS — E119 Type 2 diabetes mellitus without complications: Secondary | ICD-10-CM | POA: Diagnosis not present

## 2017-08-14 DIAGNOSIS — Z7902 Long term (current) use of antithrombotics/antiplatelets: Secondary | ICD-10-CM | POA: Diagnosis not present

## 2017-08-14 DIAGNOSIS — Z8673 Personal history of transient ischemic attack (TIA), and cerebral infarction without residual deficits: Secondary | ICD-10-CM | POA: Diagnosis not present

## 2017-08-14 DIAGNOSIS — I6389 Other cerebral infarction: Secondary | ICD-10-CM | POA: Diagnosis not present

## 2017-08-14 DIAGNOSIS — I69351 Hemiplegia and hemiparesis following cerebral infarction affecting right dominant side: Secondary | ICD-10-CM | POA: Diagnosis not present

## 2017-08-14 DIAGNOSIS — M47812 Spondylosis without myelopathy or radiculopathy, cervical region: Secondary | ICD-10-CM | POA: Diagnosis not present

## 2017-08-14 HISTORY — PX: LOOP RECORDER INSERTION: EP1214

## 2017-08-14 SURGERY — LOOP RECORDER INSERTION
Anesthesia: LOCAL

## 2017-08-14 MED ORDER — LIDOCAINE-EPINEPHRINE 1 %-1:100000 IJ SOLN
INTRAMUSCULAR | Status: DC | PRN
  Administered 2017-08-14: 20 mL

## 2017-08-14 SURGICAL SUPPLY — 2 items
LOOP REVEAL LINQSYS (Prosthesis & Implant Heart) ×1 IMPLANT
PACK LOOP INSERTION (CUSTOM PROCEDURE TRAY) ×2 IMPLANT

## 2017-08-14 NOTE — H&P (View-Only) (Signed)
ELECTROPHYSIOLOGY CONSULT NOTE  Patient ID: Jake Gross MRN: [id number], DOB/AGE: 11-12-1940   Primary Physician: Jearld Fenton, NP   Reason for Consultation: Cryptogenic stroke; recommendations regarding Implantable Loop Recorder  History of Present Illness [place] S [place] was admitted September 2018 with acute CVA.  MRI revealed L parietal stroke.   he has been monitored on telemetry which has demonstrated no arrhythmias. No cause has been identified. Inpatient stroke work-up including TEE were unrevealing.  He is making slow but steady recovery. EP has been asked to evaluate for placement of an implantable loop recorder to monitor for atrial fibrillation.  Past Medical History Past Medical History:  Diagnosis Date  . 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 Arthritis   . Cervical spondylosis   . Diabetes mellitus   . Hyperlipidemia   . Hypertension   . Stroke ([place]) 06/19/2017  . Vitamin D deficiency     Past Surgical History Past Surgical History:  Procedure Laterality Date  . ABCESS DRAINAGE Left    buttocks  . CARDIOVASCULAR STRESS TEST  10/12/1999   EF 63%. NO ISCHEMIA  . COLONOSCOPY W/ POLYPECTOMY     5 polyps  . EUS N/A 03/11/2015   Procedure: LOWER ENDOSCOPIC ULTRASOUND (EUS);  Surgeon: Milus Banister, MD;  Location: [facility];  Service: Endoscopy;  Laterality: N/A;  . FLEXIBLE SIGMOIDOSCOPY N/A 02/02/2015   Procedure: FLEXIBLE SIGMOIDOSCOPY;  Surgeon: Inda Castle, MD;  [facility]: [facility];  Service: Endoscopy;  Laterality: N/A;  ERBE  . PARTIAL PROCTECTOMY BY TEM N/A 04/08/2015   Procedure: TEM PARTIAL PROCTECTOMY OF RECTAL MASS;  Surgeon: Michael Boston, MD;  Location: [facility];  Service: General;  Laterality: N/A;  . TEE WITHOUT CARDIOVERSION N/A 06/25/2017   Procedure: TRANSESOPHAGEAL ECHOCARDIOGRAM (TEE);  Surgeon: Skeet Latch, MD;  Location: [facility];  Service: Cardiovascular;  Laterality:  N/A;  . TONSILLECTOMY AND ADENOIDECTOMY     as child    Allergies/Intolerances No Known Allergies  Medicines are reviewed  The patient has a family history of HTN.  Social History Social History   Social History  . Marital status: Divorced    Spouse name: N/A  . Number of children: 2  . Years of education: N/A   Occupational History  . 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 Smoking status: Former Smoker    Types: Cigarettes    Quit date: 08/13/1994  . Smokeless tobacco: Never Used  . Alcohol use No  . Drug use: No  . Sexual activity: Not Currently   Other Topics Concern  . Not on file   Social History Narrative  . No narrative on file    Review of Systems General: No chills, fever, night sweats or weight changes  Cardiovascular:  No chest pain, dyspnea on exertion, edema, orthopnea, palpitations, paroxysmal nocturnal dyspnea Dermatological: No rash, lesions or masses Respiratory: No cough, dyspnea Urologic: No hematuria, dysuria Abdominal: No nausea, vomiting, diarrhea, bright red blood per rectum, melena, or hematemesis Neurologic: No visual changes, weakness, changes in mental status All other systems reviewed and are otherwise negative except as noted above.  Physical Exam Blood pressure (!) 148/62, pulse 61, height 5\' 7"  (1.702 m), weight 231 lb (104.8 kg), SpO2 95 %.  General: Well developed, well appearing 76 y.o. male in no acute distress. HEENT: Normocephalic, atraumatic. EOMs intact. Sclera nonicteric. Oropharynx clear.  Neck: Supple without bruits. No JVD. Lungs: Respirations regular and unlabored,  CTA bilaterally. No wheezes, rales or rhonchi. Heart: RRR. S1, S2 present. No murmurs, rub, S3 or S4. Abdomen: Soft, non-tender, non-distended. BS present x 4 quadrants. No hepatosplenomegaly.  Extremities: No clubbing, cyanosis or edema. DP/PT/Radials 2+ and equal bilaterally. Psych: Normal affect. Neuro: Alert and oriented X 3. Moves all  extremities spontaneously.  R hand moves with mild contracture Musculoskeletal: No kyphosis. Skin: Intact. Warm and dry. No rashes or petechiae in exposed areas.   Labs Lab Results  Component Value Date   WBC 5.9 06/22/2017   HGB 12.8 (L) 06/22/2017   HCT 38.7 (L) 06/22/2017   MCV 88.2 06/22/2017   PLT 174 06/22/2017   No results for input(s): NA, K, CL, CO2, BUN, CREATININE, CALCIUM, PROT, BILITOT, ALKPHOS, ALT, AST, GLUCOSE in the last 168 hours.  Invalid input(s): LABALBU No results for input(s): INR in the last 72 hours.  Radiology/Studies No results found.  TEE reviewed  12-lead ECG sinus rhythm   Assessment and Plan 1. Cryptogenic stroke  The patient has cryptogenic stroke.  I have reviewed Dr Clydene Fake office note.  We agree that implantable loop recorder insertion to monitor for AF is appropriate. The indication for loop recorder insertion / monitoring for AF in setting of cryptogenic stroke was discussed with the patient. The loop recorder insertion procedure was reviewed in detail including risks and benefits. These risks include but are not limited to bleeding and infection. The patient expressed verbal understanding and agrees to proceed. The patient was also counseled regarding wound care and device follow-up.  We will schedule the procedure at the next available time.  Army Fossa MD 08/13/2017, 3:46 PM

## 2017-08-14 NOTE — Interval H&P Note (Signed)
History and Physical Interval Note:  08/14/2017 11:44 AM  [place]  has presented today for surgery, with the diagnosis of cryptogenic stroke  The various methods of treatment have been discussed with the patient and family. After consideration of risks, benefits and other options for treatment, the patient has consented to  Procedure(s): LOOP RECORDER INSERTION (N/A) as a surgical intervention .  The patient's history has been reviewed, patient examined, no change in status, stable for surgery.  I have reviewed the patient's chart and labs.  Questions were answered to the patient's satisfaction.     Thompson Grayer

## 2017-08-15 ENCOUNTER — Telehealth: Payer: Self-pay | Admitting: Internal Medicine

## 2017-08-15 DIAGNOSIS — M47812 Spondylosis without myelopathy or radiculopathy, cervical region: Secondary | ICD-10-CM | POA: Diagnosis not present

## 2017-08-15 DIAGNOSIS — I69351 Hemiplegia and hemiparesis following cerebral infarction affecting right dominant side: Secondary | ICD-10-CM | POA: Diagnosis not present

## 2017-08-15 DIAGNOSIS — E119 Type 2 diabetes mellitus without complications: Secondary | ICD-10-CM | POA: Diagnosis not present

## 2017-08-15 DIAGNOSIS — Z7902 Long term (current) use of antithrombotics/antiplatelets: Secondary | ICD-10-CM | POA: Diagnosis not present

## 2017-08-15 DIAGNOSIS — M1990 Unspecified osteoarthritis, unspecified site: Secondary | ICD-10-CM | POA: Diagnosis not present

## 2017-08-15 DIAGNOSIS — I10 Essential (primary) hypertension: Secondary | ICD-10-CM | POA: Diagnosis not present

## 2017-08-15 NOTE — Telephone Encounter (Signed)
LPM 08/15/2017 md

## 2017-08-15 NOTE — Telephone Encounter (Signed)
New Message   FYI:   She seen the patient today his bp was 220/110 left arm and on the right was 148/80 , patient had LINQ put in yesterday is this normal ?  Pt not in pain , not having any other symptoms

## 2017-08-16 DIAGNOSIS — I10 Essential (primary) hypertension: Secondary | ICD-10-CM | POA: Diagnosis not present

## 2017-08-16 DIAGNOSIS — M1990 Unspecified osteoarthritis, unspecified site: Secondary | ICD-10-CM | POA: Diagnosis not present

## 2017-08-16 DIAGNOSIS — M47812 Spondylosis without myelopathy or radiculopathy, cervical region: Secondary | ICD-10-CM | POA: Diagnosis not present

## 2017-08-16 DIAGNOSIS — Z7902 Long term (current) use of antithrombotics/antiplatelets: Secondary | ICD-10-CM | POA: Diagnosis not present

## 2017-08-16 DIAGNOSIS — E119 Type 2 diabetes mellitus without complications: Secondary | ICD-10-CM | POA: Diagnosis not present

## 2017-08-16 DIAGNOSIS — I69351 Hemiplegia and hemiparesis following cerebral infarction affecting right dominant side: Secondary | ICD-10-CM | POA: Diagnosis not present

## 2017-08-16 NOTE — Telephone Encounter (Signed)
Spoke with patient and shared Dr. Jackalyn Lombard response to patient's concern about elevated BP.  Patient verbalized his understanding.

## 2017-08-20 DIAGNOSIS — M47812 Spondylosis without myelopathy or radiculopathy, cervical region: Secondary | ICD-10-CM | POA: Diagnosis not present

## 2017-08-20 DIAGNOSIS — I10 Essential (primary) hypertension: Secondary | ICD-10-CM | POA: Diagnosis not present

## 2017-08-20 DIAGNOSIS — Z7902 Long term (current) use of antithrombotics/antiplatelets: Secondary | ICD-10-CM | POA: Diagnosis not present

## 2017-08-20 DIAGNOSIS — I69351 Hemiplegia and hemiparesis following cerebral infarction affecting right dominant side: Secondary | ICD-10-CM | POA: Diagnosis not present

## 2017-08-20 DIAGNOSIS — E119 Type 2 diabetes mellitus without complications: Secondary | ICD-10-CM | POA: Diagnosis not present

## 2017-08-20 DIAGNOSIS — M1990 Unspecified osteoarthritis, unspecified site: Secondary | ICD-10-CM | POA: Diagnosis not present

## 2017-08-22 ENCOUNTER — Telehealth: Payer: Self-pay | Admitting: *Deleted

## 2017-08-22 ENCOUNTER — Encounter: Payer: Self-pay | Admitting: *Deleted

## 2017-08-22 ENCOUNTER — Other Ambulatory Visit: Payer: Self-pay | Admitting: Internal Medicine

## 2017-08-22 ENCOUNTER — Other Ambulatory Visit: Payer: Self-pay

## 2017-08-22 DIAGNOSIS — E119 Type 2 diabetes mellitus without complications: Secondary | ICD-10-CM | POA: Diagnosis not present

## 2017-08-22 DIAGNOSIS — M1990 Unspecified osteoarthritis, unspecified site: Secondary | ICD-10-CM | POA: Diagnosis not present

## 2017-08-22 DIAGNOSIS — I10 Essential (primary) hypertension: Secondary | ICD-10-CM | POA: Diagnosis not present

## 2017-08-22 DIAGNOSIS — M47812 Spondylosis without myelopathy or radiculopathy, cervical region: Secondary | ICD-10-CM | POA: Diagnosis not present

## 2017-08-22 DIAGNOSIS — Z7902 Long term (current) use of antithrombotics/antiplatelets: Secondary | ICD-10-CM | POA: Diagnosis not present

## 2017-08-22 DIAGNOSIS — I69351 Hemiplegia and hemiparesis following cerebral infarction affecting right dominant side: Secondary | ICD-10-CM | POA: Diagnosis not present

## 2017-08-22 NOTE — Telephone Encounter (Signed)
noted 

## 2017-08-22 NOTE — Patient Outreach (Addendum)
[facility]) Care Management  08/22/2017  Jake Gross November 29, 1940 [id number]   Telephone assessment:  Telephone call to patient for follow up assessment. HIPAA verified with patient. Patient states his blood sugars having been ranging from 188 to 250 with the increase dose of insulin advised by his doctor.  Patient states he has a follow up appointment scheduled with his endocrinologist on 08/28/17.  Patient states she received the diabetic education material from this RNCM. Patient states he has reviewed it. Denies having any questions.  Patient reports he continues to have home health nurse visits who is also monitoring his blood sugars.   Patient states he is adhering to his diabetic diet.  Patient reports his ankle swelling has gone down. Patient states, " I can even see my ankle bones now." Patient states his next follow up appointment with his primary doctor is 11/16/16 for a wellness visit. Patient reports having his flu shot October 2018.  Patient reports having loop recorder inserted on 08/14/17.  RNCM advised patient to monitor area where loops recorder was inserted for signs/ symptoms of infection. RNCM discussed signs/symptoms of infection and advised patient to call his doctor if noticed. Patient verbalized understanding.  RNCM  discussed [facility] care management health coach services. Patient verbally agreed to ongoing telephone follow up by health coach for diabetic education/ management. ' RNCM advised patient to report any new or ongoing signs/ symptoms to his doctor. Advised patient to continue to take his medication as prescribed and keep follow up doctor appointments.   PLAN; RNCM will refer patient to health coach.   Jake Plowman RN,BSN,CCM [facility] Telephonic  [phone]

## 2017-08-22 NOTE — Telephone Encounter (Signed)
Copied from [place] [phone]. Topic: General - Other >> Aug 22, 2017  8:44 AM Darl Householder, RMA wrote: Reason for CRM: Marlowe Kays w/Kendrick at home went to see pt last Monday and checked BP and noticed a big difference between left and right arm; left arm was 170/76 and rt arm was 156/76 and she just wanted to let you know, she will go see him today again and will let you know if there are any changes

## 2017-08-28 ENCOUNTER — Encounter: Payer: Self-pay | Admitting: Endocrinology

## 2017-08-28 ENCOUNTER — Ambulatory Visit (INDEPENDENT_AMBULATORY_CARE_PROVIDER_SITE_OTHER): Payer: Medicare Other | Admitting: Endocrinology

## 2017-08-28 VITALS — BP 149/88 | HR 58 | Wt 224.2 lb

## 2017-08-28 DIAGNOSIS — Z794 Long term (current) use of insulin: Secondary | ICD-10-CM

## 2017-08-28 DIAGNOSIS — E119 Type 2 diabetes mellitus without complications: Secondary | ICD-10-CM

## 2017-08-28 DIAGNOSIS — I639 Cerebral infarction, unspecified: Secondary | ICD-10-CM | POA: Diagnosis not present

## 2017-08-28 LAB — POCT GLYCOSYLATED HEMOGLOBIN (HGB A1C): HEMOGLOBIN A1C: 7.2

## 2017-08-28 MED ORDER — INSULIN LISPRO 100 UNIT/ML (KWIKPEN): PEN_INJECTOR | SUBCUTANEOUS | 11 refills | Status: DC

## 2017-08-28 NOTE — Progress Notes (Signed)
Subjective:    Patient ID: Jake Gross, male    DOB: 1940/12/01, 76 y.o.   MRN: [id number]  HPI Pt returns for f/u of diabetes mellitus:  DM type: Insulin-requiring type 2 DM.   Dx'ed: 6387 Complications: polyneuropathy, retinopathy, CVA, and renal insufficiency.  Therapy: insulin since 2005 DKA: never Severe hypoglycemia: last episode was in 2014 Pancreatitis: never.  Pancreatic imaging: normal CT in 2006 Other: he eats 2 meals per day; he takes multiple daily injections.  Interval history: He brings a record of his cbg's which I have reviewed today.  It varies from 120-300.  It is in general higher as the day goes on.  He continues to feel better since recent URI.  Past Medical History:  Diagnosis Date  . 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 Arthritis   . Cervical spondylosis   . Diabetes mellitus   . Hyperlipidemia   . Hypertension   . Stroke ([place]) 06/19/2017  . Vitamin D deficiency     Past Surgical History:  Procedure Laterality Date  . ABCESS DRAINAGE Left    buttocks  . CARDIOVASCULAR STRESS TEST  10/12/1999   EF 63%. NO ISCHEMIA  . COLONOSCOPY W/ POLYPECTOMY     5 polyps  . TONSILLECTOMY AND ADENOIDECTOMY     as child    Social History   Socioeconomic History  . Marital status: Divorced    Spouse name: Not on file  . Number of children: 2  . Years of education: Not on file  . Highest education level: Not on file  Social Needs  . Financial resource strain: Not on file  . Food insecurity - worry: Not on file  . Food insecurity - inability: Not on file  . Transportation needs - medical: Not on file  . Transportation needs - non-medical: Not on file  Occupational History  . Occupation: retired    Fish farm manager: [employer]  Tobacco Use  . Smoking status: Former Smoker    Types: Cigarettes    Last attempt to quit: 08/13/1994    Years since quitting: 23.0  . Smokeless tobacco: Never Used  Substance and  Sexual Activity  . Alcohol use: No    Alcohol/week: 0.0 oz  . Drug use: No  . Sexual activity: Not Currently  Other Topics Concern  . Not on file  Social History Narrative  . Not on file    Current Outpatient Medications on File Prior to Visit  Medication Sig Dispense Refill  . clopidogrel (PLAVIX) 75 MG tablet TAKE 1 TABLET BY MOUTH EVERY DAY 30 tablet 1  . diltiazem (CARDIZEM CD) 180 MG 24 hr capsule TAKE 1 CAPSULE BY MOUTH EVERY DAY 30 capsule 0  . glucose blood (ONE TOUCH ULTRA TEST) test strip 1 each by Other route 2 (two) times daily. And lancets 2/day 200 each 3  . insulin glargine (LANTUS) 100 UNIT/ML injection Inject 50 Units into the skin at bedtime.    . Insulin Pen Needle 31G X 5 MM MISC 1 each by Does not apply route 3 (three) times daily as needed. 300 each 4  . losartan-hydrochlorothiazide (HYZAAR) 100-25 MG tablet TAKE 1 TABLET DAILY 90 tablet 1  . rosuvastatin (CRESTOR) 20 MG tablet Take 1 tablet (20 mg total) by mouth at bedtime. 90 tablet 1   No current facility-administered medications on file prior to visit.     No Known Allergies  Family History  Problem Relation Age of  Onset  . Cancer Father        "all over"  . Coronary artery disease Brother   . Diabetes Brother   . Stroke Mother   . Diabetes Mother   . Cancer Brother   . Colon cancer Neg Hx   . Esophageal cancer Neg Hx   . Rectal cancer Neg Hx   . Stomach cancer Neg Hx     BP (!) 149/88 (BP Location: Left Arm, Patient Position: Sitting, Cuff Size: Normal)   Pulse (!) 58   Wt 224 lb 3.2 oz (101.7 kg)   SpO2 96%   BMI 35.11 kg/m    Review of Systems He denies hypoglycemia.     Objective:   Physical Exam VITAL SIGNS:  See vs page GENERAL: no distress Pulses: foot pulses are intact bilaterally.   MSK: no deformity of the feet or ankles.  CV: 1+ bilat edema of the legs.  Skin:  no ulcer on the feet or ankles.  normal temp on the feet and ankle, but there is hyperpigmentation of the legs.   Neuro: sensation is intact to touch on the feet and ankles.    Lab Results  Component Value Date   HGBA1C 7.2 08/28/2017      Assessment & Plan:  Insulin-requiring type 2 DM: he needs increased rx, if it can be done with a regimen that avoids or minimizes hypoglycemia.  CVA: insulin need is increasing again since hospitalization. Renal failure: the pattern of cbg's indicates the need to increase humalog rather than lantus.   Patient Instructions  check your blood sugar twice a day.  vary the time of day when you check, between before the 3 meals, and at bedtime.  also check if you have symptoms of your blood sugar being too high or too low.  please keep a record of the readings and bring it to your next appointment here (or you can bring the meter itself).  You can write it on any piece of paper.  please call us sooner if your blood sugar goes below 70, or if you have a lot of readings over 200. Please continue the same lantus, 50 units at bedtime, and:   increase the humalog to 30 units twice a day (just before meals) 35-5-35 units.   With time, your insulin need will return back to what it was before you were in the hospital.  Please come back for a follow-up appointment in 2-3 months.

## 2017-08-28 NOTE — Patient Instructions (Addendum)
check your blood sugar twice a day.  vary the time of day when you check, between before the 3 meals, and at bedtime.  also check if you have symptoms of your blood sugar being too high or too low.  please keep a record of the readings and bring it to your next appointment here (or you can bring the meter itself).  You can write it on any piece of paper.  please call us sooner if your blood sugar goes below 70, or if you have a lot of readings over 200. Please continue the same lantus, 50 units at bedtime, and:   increase the humalog to 30 units twice a day (just before meals) 35-5-35 units.   With time, your insulin need will return back to what it was before you were in the hospital.  Please come back for a follow-up appointment in 2-3 months.

## 2017-08-29 ENCOUNTER — Ambulatory Visit (INDEPENDENT_AMBULATORY_CARE_PROVIDER_SITE_OTHER): Payer: Self-pay | Admitting: *Deleted

## 2017-08-29 DIAGNOSIS — I639 Cerebral infarction, unspecified: Secondary | ICD-10-CM

## 2017-08-29 LAB — CUP PACEART INCLINIC DEVICE CHECK
Date Time Interrogation Session: 20181114143528
MDC IDC PG IMPLANT DT: 20181030

## 2017-08-29 NOTE — Progress Notes (Signed)
Wound check appointment. Steri-strips removed. Wound without redness or edema. Incision edges approximated, wound well healed. Normal device function. Battery status: good. R-waves 0.95mV. No symptom, tachy, or AF episodes. Pause and brady detection off at implant. Monthly summary reports and ROV with JA PRN.

## 2017-09-05 ENCOUNTER — Other Ambulatory Visit: Payer: Self-pay | Admitting: Internal Medicine

## 2017-09-10 ENCOUNTER — Other Ambulatory Visit: Payer: Self-pay | Admitting: Internal Medicine

## 2017-09-12 ENCOUNTER — Other Ambulatory Visit: Payer: Self-pay | Admitting: *Deleted

## 2017-09-13 ENCOUNTER — Ambulatory Visit (INDEPENDENT_AMBULATORY_CARE_PROVIDER_SITE_OTHER): Payer: Medicare Other | Admitting: *Deleted

## 2017-09-13 ENCOUNTER — Other Ambulatory Visit: Payer: Self-pay | Admitting: Internal Medicine

## 2017-09-13 DIAGNOSIS — I639 Cerebral infarction, unspecified: Secondary | ICD-10-CM | POA: Diagnosis not present

## 2017-09-13 NOTE — Patient Outreach (Signed)
[facility]) Care Management  09/13/2017   Jake Gross 21-Jul-1941 [id number]  Subjective: RN Health Coach telephone call to patient.  Hipaa compliance verified. Per patient his fasting blood sugar was 185. Patient stated that he has had a hypoglycemia reaction years ago and passed out. He didn't remember but he had called EMS. Per patient he has been keeping up with his health maintenance. Patient stated the he had his eye exam, and hearing test and flu shot.  Per patient his next wellness checkup is in Feb. Patient does not have a medical alert. Patient is taking his medications as prescribed. Patient checks his blood sugars bid. Patient has agreed to follow up outreach calls.   Current Medications:  Current Outpatient Medications  Medication Sig Dispense Refill  . amLODipine (NORVASC) 5 MG tablet TAKE 1 TABLET DAILY 90 tablet 0  . clopidogrel (PLAVIX) 75 MG tablet TAKE 1 TABLET BY MOUTH EVERY DAY 30 tablet 1  . diltiazem (CARDIZEM CD) 180 MG 24 hr capsule TAKE 1 CAPSULE BY MOUTH EVERY DAY 30 capsule 5  . glucose blood (ONE TOUCH ULTRA TEST) test strip 1 each by Other route 2 (two) times daily. And lancets 2/day 200 each 3  . insulin glargine (LANTUS) 100 UNIT/ML injection Inject 50 Units into the skin at bedtime.    . insulin lispro (HUMALOG KWIKPEN) 100 UNIT/ML KiwkPen 3 times a day (just before each meal) 35-5-35 units, and and pen needles 4/day 10 pen 11  . Insulin Pen Needle 31G X 5 MM MISC 1 each by Does not apply route 3 (three) times daily as needed. 300 each 4  . losartan-hydrochlorothiazide (HYZAAR) 100-25 MG tablet TAKE 1 TABLET DAILY 90 tablet 1  . metoprolol succinate (TOPROL-XL) 25 MG 24 hr tablet TAKE 1 TABLET DAILY 30 tablet 0  . rosuvastatin (CRESTOR) 20 MG tablet Take 1 tablet (20 mg total) by mouth at bedtime. 90 tablet 1   No current facility-administered medications for this visit.     Functional Status:  In your present state of health, do  you have any difficulty performing the following activities: 09/13/2017 08/14/2017  Hearing? - N  Vision? N N  Difficulty concentrating or making decisions? N N  Walking or climbing stairs? N N  Dressing or bathing? N N  Doing errands, shopping? - Facilities manager and eating ? N -  Using the Toilet? N -  In the past six months, have you accidently leaked urine? Y -  Do you have problems with loss of bowel control? N -  Managing your Medications? N -  Managing your Finances? N -  Housekeeping or managing your Housekeeping? N -  Some recent data might be hidden    Fall/Depression Screening: Fall Risk  09/13/2017 08/07/2017 07/20/2017  Falls in the past year? Yes No Yes  Number falls in past yr: 1 - 1  Injury with Fall? No - No  Risk for fall due to : History of fall(s);Impaired balance/gait - History of fall(s)  Follow up Falls evaluation completed - Education provided   [facility] 2/9 Scores 09/13/2017 07/20/2017 11/07/2016 10/20/2015 09/14/2015  PHQ - 2 Score 0 0 0 1 0    THN CM Care Plan Problem One     Most Recent Value  Care Plan Problem One  Knowledge Deficit in Self Management of Diabetes  Role Documenting the Problem One  [place] for Problem One  Active  THN Long Term Goal  Patient will see a decrease in his A1C within the next 90 days  [place] Term Goal Start Date  09/13/17  Interventions for Problem One Long Term Goal  RN discussed what the A1C is. RN discussed what the fasting blood sugar should be to help A1C below 7.0 . RN sent patient educational booklet living well with diabetes. RN will follow up with further discussion and teach back  THN CM Short Term Goal #1   Patient will report checking blood sugar and documenting as per Dr order within the next 30 days  [facility] CM Short Term Goal #1 Start Date  09/12/17  Interventions for Short Term Goal #1  [place] sent patient a 2019 Calendar book for documentation. RN discussed checking blood sugars. RN will  frollow up with discussion and teach back  THN CM Short Term Goal #2   Patient fasting blood sugar will be 130 or less within the next 30 days  THN CM Short Term Goal #2 Start Date  09/13/17  Interventions for Short Term Goal #2  Patient will monitor diet and blood sugars closly to help decrease fbs. RN will follow up with further discussion and teachback      Assessment:  Fasting blood sugar was 185 Patient is taking medications as prescribed Patient is following up with health maintenance appointments Per patient his goal is to see his A1C decrease Patient will benefit from [place] telephonic outreach for education and support for diabetes self management.   Plan:  RN sent patient a 2019 Calendar Book RN sent patient living well with diabetes book RN sent EMMI educational material on A1C RN sent EMMI  Educational material on diabetic sick days Rn sent assessment and care plan to physician RN will follow up outreach within the month of December  Jake Gross [place] Care Management [phone]

## 2017-09-13 NOTE — Progress Notes (Signed)
Carelink Summary Report / Loop Recorder 

## 2017-09-25 LAB — CUP PACEART REMOTE DEVICE CHECK
Date Time Interrogation Session: 20181129153651
MDC IDC PG IMPLANT DT: 20181030

## 2017-10-01 ENCOUNTER — Other Ambulatory Visit: Payer: Self-pay | Admitting: Internal Medicine

## 2017-10-02 NOTE — Telephone Encounter (Signed)
Ok to refill? Electronically refill request for clopidogrel (PLAVIX) 75 MG tablet  Last prescribed on 08/22/2017. Last seen on 08/03/2017. Should this be going to cardiologist?

## 2017-10-12 ENCOUNTER — Other Ambulatory Visit: Payer: Self-pay | Admitting: *Deleted

## 2017-10-12 NOTE — Patient Outreach (Signed)
[facility]) Care Management  10/12/2017  Nenahnezad 02/13/1941 [id number]   [place] attempted  #27follow up outreach call to patient.  Patient was unavailable. HIPPA compliance voicemail message left with return callback number.  Plan: RN will call patient again within 14 days.  [facility] [phone]

## 2017-10-15 ENCOUNTER — Ambulatory Visit (INDEPENDENT_AMBULATORY_CARE_PROVIDER_SITE_OTHER): Payer: Medicare Other | Admitting: *Deleted

## 2017-10-15 DIAGNOSIS — I639 Cerebral infarction, unspecified: Secondary | ICD-10-CM

## 2017-10-15 NOTE — Progress Notes (Signed)
Carelink Summary Report / Loop Recorder 

## 2017-10-19 ENCOUNTER — Telehealth: Payer: Self-pay | Admitting: Endocrinology

## 2017-10-19 NOTE — Telephone Encounter (Signed)
Pt called and stated he needs a script send in.  The script we have never prescribed for him.   He is needing speak with someone to see if he can get it sent in for him  [place], [name]

## 2017-10-22 ENCOUNTER — Other Ambulatory Visit: Payer: Self-pay

## 2017-10-22 MED ORDER — INSULIN PEN NEEDLE 32G X 4 MM MISC: 99 refills | Status: DC

## 2017-10-22 NOTE — Telephone Encounter (Signed)
I called patient & he just wanted new prescription sent for pen needles. [facility]

## 2017-10-25 LAB — CUP PACEART REMOTE DEVICE CHECK
Implantable Pulse Generator Implant Date: 20181030
MDC IDC SESS DTM: 20181229153934

## 2017-10-26 ENCOUNTER — Ambulatory Visit: Payer: Self-pay | Admitting: *Deleted

## 2017-11-03 ENCOUNTER — Other Ambulatory Visit: Payer: Self-pay | Admitting: Internal Medicine

## 2017-11-03 DIAGNOSIS — I10 Essential (primary) hypertension: Secondary | ICD-10-CM

## 2017-11-12 ENCOUNTER — Ambulatory Visit (INDEPENDENT_AMBULATORY_CARE_PROVIDER_SITE_OTHER): Payer: Medicare Other | Admitting: *Deleted

## 2017-11-12 DIAGNOSIS — I639 Cerebral infarction, unspecified: Secondary | ICD-10-CM

## 2017-11-13 NOTE — Progress Notes (Signed)
Carelink Summary Report / Loop Recorder 

## 2017-11-16 ENCOUNTER — Other Ambulatory Visit: Payer: Self-pay | Admitting: *Deleted

## 2017-11-16 NOTE — Patient Outreach (Signed)
[facility]) Care Management  11/16/2017   Jake Gross 17-Mar-1941 [id number]  Subjective: RN Health Coach telephone call to patient.  Hipaa compliance verified. Per patient he is doing good. Patient fasting blood sugar was 135. Patient has lost 12 pounds since last outreach. Per patient he has changed his diet. He eats salads for lunch and dinner. Patient is following up with his health maintenance. He has not started an exercise program. RN discussed the benefits with his weight loss. Patient has agreed to follow up outreach calls.   Current Medications:  Current Outpatient Medications  Medication Sig Dispense Refill  . amLODipine (NORVASC) 5 MG tablet TAKE 1 TABLET DAILY 90 tablet 0  . clopidogrel (PLAVIX) 75 MG tablet TAKE 1 TABLET BY MOUTH EVERY DAY 30 tablet 1  . diltiazem (CARDIZEM CD) 180 MG 24 hr capsule TAKE 1 CAPSULE BY MOUTH EVERY DAY 30 capsule 5  . glucose blood (ONE TOUCH ULTRA TEST) test strip 1 each by Other route 2 (two) times daily. And lancets 2/day 200 each 3  . insulin glargine (LANTUS) 100 UNIT/ML injection Inject 50 Units into the skin at bedtime.    . insulin lispro (HUMALOG KWIKPEN) 100 UNIT/ML KiwkPen 3 times a day (just before each meal) 35-5-35 units, and and pen needles 4/day 10 pen 11  . Insulin Pen Needle 32G X 4 MM MISC Used to give insulin injections three times daily. 100 each prn  . losartan-hydrochlorothiazide (HYZAAR) 100-25 MG tablet TAKE 1 TABLET DAILY 90 tablet 0  . metoprolol succinate (TOPROL-XL) 25 MG 24 hr tablet TAKE 1 TABLET DAILY 90 tablet 0  . rosuvastatin (CRESTOR) 20 MG tablet Take 1 tablet (20 mg total) by mouth at bedtime. 90 tablet 1   No current facility-administered medications for this visit.     Functional Status:  In your present state of health, do you have any difficulty performing the following activities: 11/16/2017 09/13/2017  Hearing? N -  Vision? N N  Difficulty concentrating or making decisions? N N   Walking or climbing stairs? N N  Dressing or bathing? N N  Doing errands, shopping? N -  Preparing Food and eating ? N N  Using the Toilet? N N  In the past six months, have you accidently leaked urine? Y Y  Do you have problems with loss of bowel control? N N  Managing your Medications? N N  Managing your Finances? N N  Housekeeping or managing your Housekeeping? N N  Some recent data might be hidden    Fall/Depression Screening: Fall Risk  11/16/2017 09/13/2017 08/07/2017  Falls in the past year? Yes Yes No  Number falls in past yr: 1 1 -  Injury with Fall? No No -  Risk for fall due to : Impaired balance/gait;History of fall(s) History of fall(s);Impaired balance/gait -  Follow up Falls evaluation completed;Education provided Falls evaluation completed -   PHQ 2/9 Scores 11/16/2017 09/13/2017 07/20/2017 11/07/2016 10/20/2015 09/14/2015  PHQ - 2 Score 0 0 0 0 1 0   THN CM Care Plan Problem One     Most Recent Value  Care Plan Problem One  Knowledge Deficit in Self Management of Diabetes  (Pended)   Care Plan for Problem One  Active  (Pended)   THN Long Term Goal   Patient will see a decrease in his A1C within the next 90 days  (Pended)   Interventions for Problem One Long Term Goal  RN discussed what the A1C is. RN  discussed what the fasting blood sugar should be to help A1C below 7.0 . RN sent patient educational booklet living well with diabetes. RN will follow up with further discussion and teach back  (Pended)   THN CM Short Term Goal #1   Patient will report checking blood sugar and documenting as per Dr order within the next 30 days  (Pended)   [facility] CM Short Term Goal #1 Met Date  11/16/17  (Pended)   Interventions for Short Term Goal #1  [place] sent patient a 2019 Calendar book for documentation. RN discussed checking blood sugars. RN will frollow up with discussion and teach back  (Pended)   THN CM Short Term Goal #2   Patient fasting blood sugar will be 130 or less  within the next 30 days  (Pended)   THN CM Short Term Goal #2 Start Date  11/16/17  (Pended)   Interventions for Short Term Goal #2  Patient is monitoring diet and blood sugars closely . Fasting blood sugars are decreasing. RN will follow up with further discussion and teachback  (Pended)   THN CM Short Term Goal #3  Patient will report looking into an exercise progrgram within the next 30 days  (Pended)   THN CM Short Term Goal #3 Start Date  11/16/17  (Pended)   Interventions for Short Tern Goal #3  RN discussed with patient about a routine exercise program. RN discussed facilities that are near his home. RN sent EMMI educational material on diabetes and exercise.RN will follow up with next outreach  (Pended)      Assessment:  Weight loss of 12 pounds BP 131/70 FBS 135 Patient will benefit from [employer] telephonic outreach for education and support for diabetes self management.   Plan:  RN discussed signs and symptoms of stroke and heart attack Patient has made eye exam for March PCP appointment scheduled Endocrinologist appointment scheduled RN sent EMMI educational material on What you can do to prevent a second stroke RN sent EMMI on Diabetic Meal Planning Rn sent education on Diabetes and exercise RN will follow up with the month of March  Jenayah Antu [place] Care Management [phone]

## 2017-11-22 LAB — CUP PACEART REMOTE DEVICE CHECK
Date Time Interrogation Session: 20190128171252
MDC IDC PG IMPLANT DT: 20181030

## 2017-11-28 ENCOUNTER — Encounter: Payer: Self-pay | Admitting: Endocrinology

## 2017-11-28 ENCOUNTER — Ambulatory Visit (INDEPENDENT_AMBULATORY_CARE_PROVIDER_SITE_OTHER): Payer: Medicare Other | Admitting: Endocrinology

## 2017-11-28 VITALS — BP 178/84 | HR 58 | Wt 225.0 lb

## 2017-11-28 DIAGNOSIS — I639 Cerebral infarction, unspecified: Secondary | ICD-10-CM

## 2017-11-28 DIAGNOSIS — Z794 Long term (current) use of insulin: Secondary | ICD-10-CM

## 2017-11-28 DIAGNOSIS — E119 Type 2 diabetes mellitus without complications: Secondary | ICD-10-CM | POA: Diagnosis not present

## 2017-11-28 LAB — POCT GLYCOSYLATED HEMOGLOBIN (HGB A1C): Hemoglobin A1C: 9.2

## 2017-11-28 MED ORDER — INSULIN LISPRO 100 UNIT/ML (KWIKPEN): PEN_INJECTOR | SUBCUTANEOUS | 11 refills | Status: DC

## 2017-11-28 NOTE — Progress Notes (Signed)
Subjective:    Patient ID: Jake Gross, male    DOB: [birth year], 77 y.o.   MRN: [id number]  HPI Pt returns for f/u of diabetes mellitus:  DM type: Insulin-requiring type 2 DM.   Dx'ed: 2130 Complications: polyneuropathy, retinopathy, CVA, and renal insufficiency.  Therapy: insulin since 2005 DKA: never Severe hypoglycemia: last episode was in 2014 Pancreatitis: never.  Pancreatic imaging: normal CT in 2006 Other: he eats 2 meals per day; he takes multiple daily injections.  Interval history: He is unable to say why a1c is worse.  Pt says he never misses the insulin.  no cbg record, but states cbg's vary from 114-343.  It is in general higher as the day goes on Past Medical History:  Diagnosis Date  . 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 Arthritis   . Cervical spondylosis   . Diabetes mellitus   . Hyperlipidemia   . Hypertension   . Stroke ([place]) 06/19/2017  . Vitamin D deficiency     Past Surgical History:  Procedure Laterality Date  . ABCESS DRAINAGE Left    buttocks  . CARDIOVASCULAR STRESS TEST  10/12/1999   EF 63%. NO ISCHEMIA  . COLONOSCOPY W/ POLYPECTOMY     5 polyps  . EUS N/A 03/11/2015   Procedure: LOWER ENDOSCOPIC ULTRASOUND (EUS);  Surgeon: Milus Banister, MD;  Location: [facility];  Service: Endoscopy;  Laterality: N/A;  . FLEXIBLE SIGMOIDOSCOPY N/A 02/02/2015   Procedure: FLEXIBLE SIGMOIDOSCOPY;  Surgeon: Inda Castle, MD;  Location: [facility];  Service: Endoscopy;  Laterality: N/A;  ERBE  . LOOP RECORDER INSERTION N/A 08/14/2017   Procedure: LOOP RECORDER INSERTION;  Surgeon: Thompson Grayer, MD;  Location: [facility];  Service: Cardiovascular;  Laterality: N/A;  . PARTIAL PROCTECTOMY BY TEM N/A 04/08/2015   Procedure: TEM PARTIAL PROCTECTOMY OF RECTAL MASS;  Surgeon: Michael Boston, MD;  Location: [facility];  Service: General;  Laterality: N/A;  . TEE WITHOUT CARDIOVERSION N/A 06/25/2017   Procedure: TRANSESOPHAGEAL ECHOCARDIOGRAM (TEE);  Surgeon: Skeet Latch, MD;  Location: [facility];  Service: Cardiovascular;  Laterality: N/A;  . TONSILLECTOMY AND ADENOIDECTOMY     as child    Social History   Socioeconomic History  . Marital status: Divorced    Spouse name: Not on file  . Number of children: 2  . Years of education: Not on file  . Highest education level: Not on file  Social Needs  . Financial resource strain: Not on file  . Food insecurity - worry: Not on file  . Food insecurity - inability: Not on file  . Transportation needs - medical: Not on file  . Transportation needs - non-medical: Not on file  Occupational History  . Occupation: retired    Fish farm manager: [employer]  Tobacco Use  . Smoking status: Former Smoker    Types: Cigarettes    Last attempt to quit: 08/13/1994    Years since quitting: 23.3  . Smokeless tobacco: Never Used  Substance and Sexual Activity  . Alcohol use: No    Alcohol/week: 0.0 oz  . Drug use: No  . Sexual activity: Not Currently  Other Topics Concern  . Not on file  Social History Narrative  . Not on file    Current Outpatient Medications on File Prior to Visit  Medication Sig Dispense Refill  . amLODipine (NORVASC) 5 MG tablet TAKE 1 TABLET DAILY 90 tablet 0  . clopidogrel (PLAVIX) 75 MG  tablet TAKE 1 TABLET BY MOUTH EVERY DAY 30 tablet 1  . diltiazem (CARDIZEM CD) 180 MG 24 hr capsule TAKE 1 CAPSULE BY MOUTH EVERY DAY 30 capsule 5  . glucose blood (ONE TOUCH ULTRA TEST) test strip 1 each by Other route 2 (two) times daily. And lancets 2/day 200 each 3  . insulin glargine (LANTUS) 100 UNIT/ML injection Inject 50 Units into the skin at bedtime.    . Insulin Pen Needle 32G X 4 MM MISC Used to give insulin injections three times daily. 100 each prn  . losartan-hydrochlorothiazide (HYZAAR) 100-25 MG tablet TAKE 1 TABLET DAILY 90 tablet 0  . metoprolol succinate (TOPROL-XL) 25 MG 24 hr tablet TAKE 1 TABLET DAILY 90 tablet 0  .  rosuvastatin (CRESTOR) 20 MG tablet Take 1 tablet (20 mg total) by mouth at bedtime. 90 tablet 1   No current facility-administered medications on file prior to visit.     No Known Allergies  Family History  Problem Relation Age of Onset  . Cancer Father        "all over"  . Coronary artery disease Brother   . Diabetes Brother   . Stroke Mother   . Diabetes Mother   . Cancer Brother   . Colon cancer Neg Hx   . Esophageal cancer Neg Hx   . Rectal cancer Neg Hx   . Stomach cancer Neg Hx     BP (!) 178/84 (BP Location: Left Arm, Patient Position: Sitting, Cuff Size: Normal)   Pulse (!) 58   Wt 225 lb (102.1 kg)   SpO2 97%   BMI 35.24 kg/m    Review of Systems He denies hypoglycemia.      Objective:   Physical Exam VITAL SIGNS:  See vs page.  GENERAL: no distress.  Pulses: foot pulses are intact bilaterally.   MSK: no deformity of the feet or ankles.  CV: 1+ bilat edema of the legs.  Skin:  no ulcer on the feet or ankles.  normal temp on the feet and ankle, but there is hyperpigmentation of the legs.  Neuro: sensation is intact to touch on the feet and ankles.    Lab Results  Component Value Date   HGBA1C 9.2 11/28/2017   Lab Results  Component Value Date   CREATININE 1.04 06/22/2017   BUN 22 (H) 06/22/2017   NA 139 06/22/2017   K 4.1 06/22/2017   CL 108 06/22/2017   CO2 25 06/22/2017       Assessment & Plan:  Insulin-requiring type 2 DM, with DR: worse.  He may need a simpler insulin regimen.   Renal insuff: in this context, he needs mostly mealtime insulin.    Patient Instructions  Your blood pressure is high today.  Please see your primary care provider soon, to have it rechecked. check your blood sugar twice a day.  vary the time of day when you check, between before the 3 meals, and at bedtime.  also check if you have symptoms of your blood sugar being too high or too low.  please keep a record of the readings and bring it to your next appointment  here (or you can bring the meter itself).  You can write it on any piece of paper.  please call us sooner if your blood sugar goes below 70, or if you have a lot of readings over 200. Please continue the same lantus, 50 units at bedtime, and:  increase the humalog to 30 units 3 times  a day (just before meals) 45-15-45 units.   Please come back for a follow-up appointment in 2 months.

## 2017-11-28 NOTE — Patient Instructions (Addendum)
Your blood pressure is high today.  Please see your primary care provider soon, to have it rechecked. check your blood sugar twice a day.  vary the time of day when you check, between before the 3 meals, and at bedtime.  also check if you have symptoms of your blood sugar being too high or too low.  please keep a record of the readings and bring it to your next appointment here (or you can bring the meter itself).  You can write it on any piece of paper.  please call us sooner if your blood sugar goes below 70, or if you have a lot of readings over 200. Please continue the same lantus, 50 units at bedtime, and:  increase the humalog to 30 units 3 times a day (just before meals) 45-15-45 units.   Please come back for a follow-up appointment in 2 months.

## 2017-12-04 ENCOUNTER — Other Ambulatory Visit: Payer: Self-pay | Admitting: Internal Medicine

## 2017-12-04 ENCOUNTER — Telehealth: Payer: Self-pay | Admitting: Internal Medicine

## 2017-12-04 NOTE — Telephone Encounter (Signed)
Copied from [place] [id number]. Topic: Quick Communication - See Telephone Encounter >> Dec 04, 2017  9:47 AM Arletha Grippe wrote: CRM for notification. See Telephone encounter for:   12/04/17. [facility] - she is calling about medicine testing for plavix.  [phone] option 1  Reference number [id number]

## 2017-12-05 ENCOUNTER — Encounter: Payer: Self-pay | Admitting: Internal Medicine

## 2017-12-05 ENCOUNTER — Ambulatory Visit (INDEPENDENT_AMBULATORY_CARE_PROVIDER_SITE_OTHER): Payer: Medicare Other | Admitting: Internal Medicine

## 2017-12-05 VITALS — BP 162/94 | HR 51 | Temp 97.9°F | Ht 67.0 in | Wt 230.0 lb

## 2017-12-05 DIAGNOSIS — M1990 Unspecified osteoarthritis, unspecified site: Secondary | ICD-10-CM | POA: Diagnosis not present

## 2017-12-05 DIAGNOSIS — Z125 Encounter for screening for malignant neoplasm of prostate: Secondary | ICD-10-CM | POA: Diagnosis not present

## 2017-12-05 DIAGNOSIS — Z794 Long term (current) use of insulin: Secondary | ICD-10-CM

## 2017-12-05 DIAGNOSIS — E875 Hyperkalemia: Secondary | ICD-10-CM

## 2017-12-05 DIAGNOSIS — I10 Essential (primary) hypertension: Secondary | ICD-10-CM | POA: Diagnosis not present

## 2017-12-05 DIAGNOSIS — E119 Type 2 diabetes mellitus without complications: Secondary | ICD-10-CM | POA: Diagnosis not present

## 2017-12-05 DIAGNOSIS — Z0000 Encounter for general adult medical examination without abnormal findings: Secondary | ICD-10-CM | POA: Diagnosis not present

## 2017-12-05 DIAGNOSIS — E7800 Pure hypercholesterolemia, unspecified: Secondary | ICD-10-CM | POA: Diagnosis not present

## 2017-12-05 DIAGNOSIS — I6389 Other cerebral infarction: Secondary | ICD-10-CM

## 2017-12-05 MED ORDER — TAMSULOSIN HCL 0.4 MG PO CAPS: 0.4000 mg | ORAL_CAPSULE | Freq: Every day | ORAL | 5 refills | Status: DC

## 2017-12-05 MED ORDER — DILTIAZEM HCL ER COATED BEADS 240 MG PO CP24: 240.0000 mg | ORAL_CAPSULE | Freq: Every day | ORAL | 2 refills | Status: DC

## 2017-12-05 NOTE — Telephone Encounter (Signed)
Medication d/c.

## 2017-12-05 NOTE — Assessment & Plan Note (Signed)
Uncontrolled Increase Cardizem to 240 mg daily Continue Losartan HCT and Metoprolol Reinforced DASH diet and exercise for weight loss CBC and CMET today

## 2017-12-05 NOTE — Telephone Encounter (Signed)
That should have been d/c'd

## 2017-12-05 NOTE — Assessment & Plan Note (Signed)
CMET and Lipid profile today Encouraged him to consume a low fat diet Continue Rosuvastatin for now

## 2017-12-05 NOTE — Assessment & Plan Note (Signed)
Uncontrolled Continue Humalog and Lantus per endo Encouraged him to consume a blow carb diet and exercise for weight loss He has eye exam scheduled Foot exam per endo He declines flu and pneumonia vaccines

## 2017-12-05 NOTE — Assessment & Plan Note (Signed)
Controlled off meds No longer following with rheum Will monitor

## 2017-12-05 NOTE — Telephone Encounter (Signed)
Please advise if appropriate... Last OV not from you 07/2017 just mentiontioned Cardizem and Losartan HCTZ

## 2017-12-05 NOTE — Assessment & Plan Note (Signed)
BP not well controlled Increase Cardizem to 240 mg daily, continue Losartan HCT, Plavix and Metoprolol

## 2017-12-05 NOTE — Addendum Note (Signed)
Addended by: Lurlean Nanny on: 12/05/2017 02:28 PM   Modules accepted: Orders

## 2017-12-05 NOTE — Patient Instructions (Signed)

## 2017-12-05 NOTE — Progress Notes (Signed)
HPI:  Pt presents to the clinic today for his Medicare Wellness Exam. He is also due to follow up chronic conditions.   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 He no longer takes MTX and Folic Acid as prescribed. He no longer follows with Dr. Trudie Reed.  DM 2: His last A1C was 9.2%. His sugars range 114-243. He is taking Humalog on a sliding scale. He takes Lantus 75 units BID. He follows with Dr. Loanne Drilling. He does not check his feet regularly. His eye exam is scheduled 01/2017.  HLD with HX of Stroke: His last LDL was 34, 06/2017. He is taking Rosuvastatin and Plavix as prescribed. He denies myalgias. He does not consume a low fat diet.  HTN: His BP today is 162/94. He is taking Losartan HCT, Metoprolol and Cardizem as prescribed. ECG from 03/2015 reviewed.  Past Medical History:  Diagnosis Date  . 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 Arthritis   . Cervical spondylosis   . Diabetes mellitus   . Hyperlipidemia   . Hypertension   . Stroke ([place]) 06/19/2017  . Vitamin D deficiency     Current Outpatient Medications  Medication Sig Dispense Refill  . amLODipine (NORVASC) 5 MG tablet TAKE 1 TABLET DAILY 90 tablet 0  . clopidogrel (PLAVIX) 75 MG tablet TAKE 1 TABLET BY MOUTH EVERY DAY 30 tablet 1  . diltiazem (CARDIZEM CD) 180 MG 24 hr capsule TAKE 1 CAPSULE BY MOUTH EVERY DAY 30 capsule 5  . glucose blood (ONE TOUCH ULTRA TEST) test strip 1 each by Other route 2 (two) times daily. And lancets 2/day 200 each 3  . insulin glargine (LANTUS) 100 UNIT/ML injection Inject 50 Units into the skin at bedtime.    . insulin lispro (HUMALOG KWIKPEN) 100 UNIT/ML KiwkPen 3 times a day (just before each meal) 45-15-45 units, and and pen needles 4/day 75 pen 11  . Insulin Pen Needle 32G X 4 MM MISC Used to give insulin injections three times daily. 100 each prn  . losartan-hydrochlorothiazide (HYZAAR) 100-25 MG tablet TAKE 1 TABLET DAILY 90 tablet 0  . metoprolol  succinate (TOPROL-XL) 25 MG 24 hr tablet TAKE 1 TABLET DAILY 90 tablet 0  . rosuvastatin (CRESTOR) 20 MG tablet Take 1 tablet (20 mg total) by mouth at bedtime. 90 tablet 1   No current facility-administered medications for this visit.     No Known Allergies  Family History  Problem Relation Age of Onset  . Cancer Father        "all over"  . Coronary artery disease Brother   . Diabetes Brother   . Stroke Mother   . Diabetes Mother   . Cancer Brother   . Colon cancer Neg Hx   . Esophageal cancer Neg Hx   . Rectal cancer Neg Hx   . Stomach cancer Neg Hx     Social History   Socioeconomic History  . Marital status: Divorced    Spouse name: Not on file  . Number of children: 2  . Years of education: Not on file  . Highest education level: Not on file  Social Needs  . Financial resource strain: Not on file  . Food insecurity - worry: Not on file  . Food insecurity - inability: Not on file  . Transportation needs - medical: Not on file  . Transportation needs - non-medical: Not on file  Occupational History  . Occupation: retired    Fish farm manager:  [employer]  Tobacco Use  . Smoking status: Former Smoker    Types: Cigarettes    Last attempt to quit: 08/13/1994    Years since quitting: 23.3  . Smokeless tobacco: Never Used  Substance and Sexual Activity  . Alcohol use: No    Alcohol/week: 0.0 oz  . Drug use: No  . Sexual activity: Not Currently  Other Topics Concern  . Not on file  Social History Narrative  . Not on file    Hospitiliaztions: 07/2017, stroke  Health Maintenance:    Flu: never  Tetanus: 10/2015  Pneumovax: never  Prevnar: never  Zostavax: never  Shingrix: never  PSA: 10/2016  Colon Screening: 09/2014  Eye Doctor: yearly  Dental Exam: no, dentures   Providers:   PCP: Webb Silversmith, NP-C    I have personally reviewed and have noted:  1. The patient's medical and social history 2. Their use of alcohol, tobacco or illicit drugs 3. Their current  medications and supplements 4. The patient's functional ability including ADL's, fall risks, home safety risks and hearing or visual impairment. 5. Diet and physical activities 6. Evidence for depression or mood disorder  Subjective:   Review of Systems:   Constitutional: Denies fever, malaise, fatigue, headache or abrupt weight changes.  HEENT: Denies eye pain, eye redness, ear pain, ringing in the ears, wax buildup, runny nose, nasal congestion, bloody nose, or sore throat. Respiratory: Denies difficulty breathing, shortness of breath, cough or sputum production.   Cardiovascular: Pt reports mild swelling in his feet. Denies chest pain, chest tightness, palpitations or swelling in the hands.  Gastrointestinal: Denies abdominal pain, bloating, constipation, diarrhea or blood in the stool.  GU: Pt reports nocturia. Denies urgency, frequency, pain with urination, burning sensation, blood in urine, odor or discharge. Musculoskeletal: Denies decrease in range of motion, difficulty with gait, muscle pain or joint pain and swelling.  Skin: Denies redness, rashes, lesions or ulcercations.  Neurological: Denies dizziness, difficulty with memory, difficulty with speech or problems with balance and coordination.  Psych: Denies anxiety, depression, SI/HI.  No other specific complaints in a complete review of systems (except as listed in HPI above).  Objective:  PE:  BP (!) 162/94   Pulse (!) 51   Temp 97.9 F (36.6 C) (Oral)   Ht 5\' 7"  (1.702 m)   Wt 230 lb (104.3 kg)   SpO2 97%   BMI 36.02 kg/m   Wt Readings from Last 3 Encounters:  11/28/17 225 lb (102.1 kg)  08/28/17 224 lb 3.2 oz (101.7 kg)  08/14/17 230 lb (104.3 kg)    General: Appears his stated age, obese in NAD. Skin: Warm, dry and intact. Vascular changes noted in BLE. Cardiovascular: Normal rate and rhythm. S1,S2 noted.  No murmur, rubs or gallops noted. No JVD or BLE edema. No carotid bruits noted. Pulmonary/Chest:  Normal effort and positive vesicular breath sounds. No respiratory distress. No wheezes, rales or ronchi noted.  Abdomen: Soft and nontender. Normal bowel sounds. No distention or masses noted. Musculoskeletal:  Strength 5/5 BUE/BLE. No signs of joint swelling.  Neurological: Alert and oriented. Cranial nerves II-XII grossly intact. Coordination normal.  Psychiatric: Mood and affect normal. Behavior is normal. Judgment and thought content normal.     BMET    Component Value Date/Time   NA 139 06/22/2017 0516   NA 141 01/19/2016   K 4.1 06/22/2017 0516   CL 108 06/22/2017 0516   CO2 25 06/22/2017 0516   GLUCOSE 150 (H) 06/22/2017 2694  BUN 22 (H) 06/22/2017 0516   BUN 34 (A) 01/19/2016   CREATININE 1.04 06/22/2017 0516   CREATININE 1.14 11/07/2016 1514   CALCIUM 8.9 06/22/2017 0516   GFRNONAA >60 06/22/2017 0516   GFRAA >60 06/22/2017 0516    Lipid Panel     Component Value Date/Time   CHOL 113 06/22/2017 0516   TRIG 172 (H) 06/22/2017 0516   HDL 23 (L) 06/22/2017 0516   CHOLHDL 4.9 06/22/2017 0516   VLDL 34 06/22/2017 0516   LDLCALC 56 06/22/2017 0516    CBC    Component Value Date/Time   WBC 5.9 06/22/2017 0516   RBC 4.39 06/22/2017 0516   HGB 12.8 (L) 06/22/2017 0516   HCT 38.7 (L) 06/22/2017 0516   PLT 174 06/22/2017 0516   MCV 88.2 06/22/2017 0516   MCH 29.2 06/22/2017 0516   MCHC 33.1 06/22/2017 0516   RDW 14.4 06/22/2017 0516   LYMPHSABS 2.3 06/22/2017 0516   MONOABS 0.7 06/22/2017 0516   EOSABS 0.3 06/22/2017 0516   BASOSABS 0.0 06/22/2017 0516    Hgb A1C Lab Results  Component Value Date   HGBA1C 9.2 11/28/2017      Assessment and Plan:   Medicare Annual Wellness Visit:  Diet: He does eat meat. He consumes fruits and veggies daily. He does eat fried food. He drinks mostly. Physical activity: Sedentary Depression/mood screen: Negative Hearing: Intact to whispered voice Visual acuity: Grossly normal, performs annual eye exam  ADLs:  Capable Fall risk: None Home safety: Good Cognitive evaluation: Intact to orientation, naming, recall and repetition EOL planning: No adv directives, full code/ I agree  Preventative Medicine: He declines flu, pneumovax, prevnar, zostovax or shingrix. Colon screening UTD. Encouraged him to consume a balanced diet and exercise regimen. Advised him to see an eye doctor and dentist annually. Will check CBC, CMET, Lipid and PSA today.   Next appointment: 3 weeks, follow up HTN   BAITY, REGINA, NP

## 2017-12-06 LAB — COMPREHENSIVE METABOLIC PANEL
ALT: 12 U/L (ref 0–53)
AST: 19 U/L (ref 0–37)
Albumin: 3.1 g/dL — ABNORMAL LOW (ref 3.5–5.2)
Alkaline Phosphatase: 83 U/L (ref 39–117)
BUN: 25 mg/dL — ABNORMAL HIGH (ref 6–23)
CALCIUM: 9.3 mg/dL (ref 8.4–10.5)
CHLORIDE: 107 meq/L (ref 96–112)
CO2: 26 meq/L (ref 19–32)
Creatinine, Ser: 1.32 mg/dL (ref 0.40–1.50)
GFR: 56.21 mL/min — AB (ref >60.00)
GLUCOSE: 163 mg/dL — AB (ref 70–99)
Potassium: 5.6 mEq/L — ABNORMAL HIGH (ref 3.5–5.1)
Sodium: 140 mEq/L (ref 135–145)
Total Bilirubin: 0.6 mg/dL (ref 0.2–1.2)
Total Protein: 6.1 g/dL (ref 6.0–8.3)

## 2017-12-06 LAB — PSA, MEDICARE: PSA: 4.46 ng/mL — AB (ref 0.10–4.00)

## 2017-12-06 LAB — CBC
HCT: 43 % (ref 39.0–52.0)
HEMOGLOBIN: 14.2 g/dL (ref 13.0–17.0)
MCHC: 33 g/dL (ref 30.0–36.0)
MCV: 85.7 fl (ref 78.0–100.0)
Platelets: 247 10*3/uL (ref 150.0–400.0)
RBC: 5.02 Mil/uL (ref 4.22–5.81)
RDW: 14.7 % (ref 11.5–15.5)
WBC: 10.1 10*3/uL (ref 4.0–10.5)

## 2017-12-06 LAB — LIPID PANEL
CHOL/HDL RATIO: 5
Cholesterol: 138 mg/dL (ref 0–200)
HDL: 28.5 mg/dL — AB (ref >39.00)

## 2017-12-06 LAB — LDL CHOLESTEROL, DIRECT: LDL DIRECT: 60 mg/dL

## 2017-12-12 ENCOUNTER — Other Ambulatory Visit: Payer: Self-pay | Admitting: Internal Medicine

## 2017-12-12 MED ORDER — FENOFIBRATE 54 MG PO TABS: 54.0000 mg | ORAL_TABLET | Freq: Every day | ORAL | 0 refills | Status: DC

## 2017-12-12 NOTE — Telephone Encounter (Signed)
[facility] was something sent to the office via fax, I have not seen form and hey said they will refax

## 2017-12-12 NOTE — Addendum Note (Signed)
Addended by: Lurlean Nanny on: 12/12/2017 10:15 AM   Modules accepted: Orders

## 2017-12-13 ENCOUNTER — Telehealth: Payer: Self-pay | Admitting: Endocrinology

## 2017-12-13 NOTE — Telephone Encounter (Signed)
Patient stated that pharmacy stated they have never received a prescription from our dr for patient insulin glargine (LANTUS) 100 UNIT/ML injection   And Pen needle (60mm/31g short)     [place], [place]

## 2017-12-14 ENCOUNTER — Other Ambulatory Visit (INDEPENDENT_AMBULATORY_CARE_PROVIDER_SITE_OTHER): Payer: Medicare Other

## 2017-12-14 DIAGNOSIS — E7800 Pure hypercholesterolemia, unspecified: Secondary | ICD-10-CM

## 2017-12-14 DIAGNOSIS — E875 Hyperkalemia: Secondary | ICD-10-CM

## 2017-12-14 LAB — LDL CHOLESTEROL, DIRECT: LDL DIRECT: 61 mg/dL

## 2017-12-14 LAB — LIPID PANEL
CHOL/HDL RATIO: 4
CHOLESTEROL: 123 mg/dL (ref 0–200)
HDL: 28.7 mg/dL — ABNORMAL LOW (ref >39.00)
NonHDL: 94.03
Triglycerides: 296 mg/dL — ABNORMAL HIGH (ref 0.0–149.0)
VLDL: 59.2 mg/dL — AB (ref 0.0–40.0)

## 2017-12-14 LAB — POTASSIUM: Potassium: 5.3 mEq/L — ABNORMAL HIGH (ref 3.5–5.1)

## 2017-12-14 MED ORDER — INSULIN GLARGINE 100 UNIT/ML ~~LOC~~ SOLN: 50.0000 [IU] | Freq: Every day | SUBCUTANEOUS | 3 refills | Status: DC

## 2017-12-14 MED ORDER — INSULIN PEN NEEDLE 32G X 4 MM MISC: 99 refills | Status: DC

## 2017-12-17 ENCOUNTER — Ambulatory Visit (INDEPENDENT_AMBULATORY_CARE_PROVIDER_SITE_OTHER): Payer: Medicare Other | Admitting: *Deleted

## 2017-12-17 ENCOUNTER — Ambulatory Visit: Payer: Self-pay | Admitting: *Deleted

## 2017-12-17 ENCOUNTER — Other Ambulatory Visit: Payer: Self-pay | Admitting: Internal Medicine

## 2017-12-17 DIAGNOSIS — I639 Cerebral infarction, unspecified: Secondary | ICD-10-CM | POA: Diagnosis not present

## 2017-12-17 DIAGNOSIS — E782 Mixed hyperlipidemia: Secondary | ICD-10-CM

## 2017-12-17 MED ORDER — FENOFIBRATE 67 MG PO CAPS: 67.0000 mg | ORAL_CAPSULE | Freq: Every day | ORAL | 1 refills | Status: DC

## 2017-12-17 NOTE — Progress Notes (Signed)
fenofiv

## 2017-12-17 NOTE — Telephone Encounter (Signed)
Pt had Sharee Pimple, a Engineer, maintenance with [employer] [facility] call with the pt on the other line. (3 way call) Sharee Pimple states pt has concerns about his BP issues and pt told her he is confused and concerned that he was already taking metoprolol and was not sure if the dr is aware, because the dr had prescribed this again at his visit. Sharee Pimple has asked if we can reach out to the pt and clarify his prescriptions, what bp meds he is supposed to be taking and how much. Pt is confused about his medicine. Pt seems to think the new Rx prescibed at last visit is in addition to what he is on, because his bp was elevated      Nurse returned call to the pt. Pt states he does not have any questions about the way he should take his medication. Pt states he just did not know the name of the medication that was called in on Friday. Notified the pt that one of the medications called in to treat BP was metoprolol. Pt verbalized understanding and states that he picked up the medication today and did not have any additional questions regarding taking the medication. Advised pt that if he did have concerns to to be sure to call the office back. Pt verbalized understanding.

## 2017-12-17 NOTE — Progress Notes (Signed)
Carelink Summary Report / Loop Recorder 

## 2017-12-18 ENCOUNTER — Ambulatory Visit: Payer: Self-pay | Admitting: *Deleted

## 2017-12-19 ENCOUNTER — Other Ambulatory Visit: Payer: Self-pay | Admitting: *Deleted

## 2017-12-19 NOTE — Addendum Note (Signed)
Addended by: Lurlean Nanny on: 12/19/2017 05:08 PM   Modules accepted: Orders

## 2017-12-21 ENCOUNTER — Other Ambulatory Visit: Payer: Self-pay | Admitting: Internal Medicine

## 2017-12-26 NOTE — Patient Outreach (Signed)
Jake Gross) Care Management  12/19/2017 Late entry  [place] [birth year] [id number]  [place] telephone call to patient.  [facility] compliance verified.Per patient he is doing good. RN discussed with patient about his A1C. Per patient he is not exercising . Patient stated it is cold and he didn't want to go out in the cold. RN discussed chair exercises. Patient has not had any hypo or hyperglycemic reactions. RN discussed with patient about talking his blood sugar reading to the physician. Patient reported he did received the calendar book that the nurse had sent him. Per patient he is taking his medications as per ordered. Patient has agreed to follow up outreach calls.   Current Medications:  Current Outpatient Medications  Medication Sig Dispense Refill  . clopidogrel (PLAVIX) 75 MG tablet TAKE 1 TABLET BY MOUTH EVERY DAY 30 tablet 10  . diltiazem (CARDIZEM CD) 240 MG 24 hr capsule Take 1 capsule (240 mg total) by mouth daily. 30 capsule 2  . fenofibrate micronized (LOFIBRA) 67 MG capsule Take 1 capsule (67 mg total) by mouth daily before breakfast. 90 capsule 1  . glucose blood (ONE TOUCH ULTRA TEST) test strip 1 each by Other route 2 (two) times daily. And lancets 2/day 200 each 3  . insulin glargine (LANTUS) 100 UNIT/ML injection Inject 0.5 mLs (50 Units total) into the skin at bedtime. 10 mL 3  . insulin lispro (HUMALOG KWIKPEN) 100 UNIT/ML KiwkPen 3 times a day (just before each meal) 45-15-45 units, and and pen needles 4/day 75 pen 11  . Insulin Pen Needle 32G X 4 MM MISC Used to give insulin injections three times daily. 100 each prn  . losartan-hydrochlorothiazide (HYZAAR) 100-25 MG tablet TAKE 1 TABLET DAILY 90 tablet 0  . metoprolol succinate (TOPROL-XL) 25 MG 24 hr tablet TAKE 1 TABLET DAILY 90 tablet 3  . rosuvastatin (CRESTOR) 20 MG tablet Take 1 tablet (20 mg total) by mouth at bedtime. 90 tablet 1  . tamsulosin (FLOMAX) 0.4 MG CAPS capsule Take 1  capsule (0.4 mg total) by mouth daily. 30 capsule 5   No current facility-administered medications for this visit.     Functional Status:  In your present state of health, do you have any difficulty performing the following activities: 12/19/2017 11/16/2017  Hearing? N N  Vision? N N  Difficulty concentrating or making decisions? N N  Walking or climbing stairs? N N  Dressing or bathing? N N  Doing errands, shopping? N N  Preparing Food and eating ? N N  Using the Toilet? N N  In the past six months, have you accidently leaked urine? Y Y  Do you have problems with loss of bowel control? N N  Managing your Medications? N N  Managing your Finances? N N  Housekeeping or managing your Housekeeping? N N  Some recent data might be hidden    Fall/Depression Screening: Fall Risk  12/19/2017 12/05/2017 11/16/2017  Falls in the past year? Yes No Yes  Number falls in past yr: 1 - 1  Injury with Fall? No - No  Risk for fall due to : History of fall(s);Impaired balance/gait;Impaired mobility - Impaired balance/gait;History of fall(s)  Follow up Falls evaluation completed;Education provided;Falls prevention discussed - Falls evaluation completed;Education provided   [facility] 2/9 Scores 12/19/2017 12/05/2017 11/16/2017 09/13/2017 07/20/2017 11/07/2016 10/20/2015  PHQ - 2 Score 0 0 0 0 0 0 1   THN CM Care Plan Problem One     Most Recent  Value  Care Plan Problem One  Knowledge Deficit in Self Management of Diabetes  Role Documenting the Problem One  [place] for Problem One  Active  THN Long Term Goal   Patient will see a decrease in his A1C within the next 90 days  Interventions for Problem One Long Term Goal  RN reiteratedwhat the A1C is. RN discussed what the fasting blood sugar should be to help A1C below 7.0 . RN sent patient educational booklet living well with diabetes. RN will follow up with further discussion and teach back  THN CM Short Term Goal #1   Patient will report checking blood  sugar and documenting as per Dr order within the next 30 days  Interventions for Short Term Goal #1  Liborio Negron Torres sent patient a 2019 Calendar book for documentation. RN discussed checking blood sugars.RN explained that patient needs to take the readings to his physician.  RN will follow up with discussion and teach back  THN CM Short Term Goal #2   Patient fasting blood sugar will be 130 or less within the next 30 days  THN CM Short Term Goal #2 Start Date  12/26/17  Interventions for Short Term Goal #2  patient blood sugars are still elevated. RN discussed diet and portion control. RN sent educational material on serving sizes. Patient will monitor diet and blood sugars closly to help decrease fbs. RN will follow up with further discussion and teachback  THN CM Short Term Goal #3  Patient will look into starting some type of routine exercise program within the next 30 days  THN CM Short Term Goal #3 Start Date  12/19/17  Interventions for Short Tern Goal #3  RN discussed silver sneaker program. RN snet patient seated chair exercises. RN discussed the improtance of Diabetes and a routine exercise program.        Assessment:  Patient is checking blood sugars Patient is not documenting and taking to physician office A1C 9.2 Patient will continue to  benefit from [place] telephonic outreach for education and support for diabetes self management.  Plan:  RN sent educational material on exercises that patient can do at home RN discussed the importance of doing a routine exercise program RN discussed portion control RN sent educational material on portion control RN will follow up within the month of April  Randale Carvalho [place] [facility] [phone]

## 2018-01-17 ENCOUNTER — Ambulatory Visit (INDEPENDENT_AMBULATORY_CARE_PROVIDER_SITE_OTHER): Payer: Medicare Other | Admitting: *Deleted

## 2018-01-17 DIAGNOSIS — I639 Cerebral infarction, unspecified: Secondary | ICD-10-CM

## 2018-01-17 NOTE — Progress Notes (Signed)
Carelink Summary Report / Loop Recorder 

## 2018-01-22 ENCOUNTER — Ambulatory Visit: Payer: Self-pay | Admitting: *Deleted

## 2018-01-22 LAB — CUP PACEART REMOTE DEVICE CHECK
Implantable Pulse Generator Implant Date: 20181030
MDC IDC SESS DTM: 20190302171014

## 2018-01-28 ENCOUNTER — Encounter: Payer: Self-pay | Admitting: Endocrinology

## 2018-01-28 ENCOUNTER — Ambulatory Visit (INDEPENDENT_AMBULATORY_CARE_PROVIDER_SITE_OTHER): Payer: Medicare Other | Admitting: Endocrinology

## 2018-01-28 VITALS — BP 146/82 | HR 59 | Temp 97.6°F | Ht 67.0 in | Wt 220.0 lb

## 2018-01-28 DIAGNOSIS — Z794 Long term (current) use of insulin: Secondary | ICD-10-CM

## 2018-01-28 DIAGNOSIS — E119 Type 2 diabetes mellitus without complications: Secondary | ICD-10-CM

## 2018-01-28 DIAGNOSIS — I6389 Other cerebral infarction: Secondary | ICD-10-CM

## 2018-01-28 LAB — POCT GLYCOSYLATED HEMOGLOBIN (HGB A1C): Hemoglobin A1C: 9.7

## 2018-01-28 MED ORDER — INSULIN LISPRO 100 UNIT/ML (KWIKPEN): PEN_INJECTOR | SUBCUTANEOUS | 11 refills | Status: DC

## 2018-01-28 MED ORDER — INSULIN GLARGINE 100 UNIT/ML SOLOSTAR PEN: 40.0000 [IU] | PEN_INJECTOR | Freq: Every day | SUBCUTANEOUS | 11 refills | Status: DC

## 2018-01-28 NOTE — Patient Instructions (Addendum)
Your blood pressure is high today.  Please see your primary care provider soon, to have it rechecked. check your blood sugar twice a day.  vary the time of day when you check, between before the 3 meals, and at bedtime.  also check if you have symptoms of your blood sugar being too high or too low.  please keep a record of the readings and bring it to your next appointment here (or you can bring the meter itself).  You can write it on any piece of paper.  please call us sooner if your blood sugar goes below 70, or if you have a lot of readings over 200. Please change the insulins to the numbers noted below.    Please come back for a follow-up appointment in 2 months.

## 2018-01-28 NOTE — Progress Notes (Signed)
Subjective:    Patient ID: [employer], male    DOB: 15-Jun-1941, 77 y.o.   MRN: [id number]  HPI Pt returns for f/u of diabetes mellitus:  DM type: Insulin-requiring type 2 DM.   Dx'ed: 9242 Complications: polyneuropathy, retinopathy, CVA, and renal insufficiency.  Therapy: insulin since 2005 DKA: never Severe hypoglycemia: last episode was in 2014 Pancreatitis: never.  Pancreatic imaging: normal CT in 2006 Other: he eats 2 meals per day; he takes multiple daily injections.  Interval history: He is unable to say why a1c is worse again today.  Pt says he never misses the insulin.  no cbg record, but states cbg's vary from 104-232.  It is in general higher as the day goes on.   Past Medical History:  Diagnosis Date  . 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 Arthritis   . Cervical spondylosis   . Diabetes mellitus   . Hyperlipidemia   . Hypertension   . Stroke ([place]) 06/19/2017  . Vitamin D deficiency     Past Surgical History:  Procedure Laterality Date  . ABCESS DRAINAGE Left    buttocks  . CARDIOVASCULAR STRESS TEST  10/12/1999   EF 63%. NO ISCHEMIA  . COLONOSCOPY W/ POLYPECTOMY     5 polyps  . EUS N/A 03/11/2015   Procedure: LOWER ENDOSCOPIC ULTRASOUND (EUS);  Surgeon: Milus Banister, MD;  Location: [facility];  Service: Endoscopy;  Laterality: N/A;  . FLEXIBLE SIGMOIDOSCOPY N/A 02/02/2015   Procedure: FLEXIBLE SIGMOIDOSCOPY;  Surgeon: Inda Castle, MD;  Location: [facility];  Service: Endoscopy;  Laterality: N/A;  ERBE  . LOOP RECORDER INSERTION N/A 08/14/2017   Procedure: LOOP RECORDER INSERTION;  Surgeon: Thompson Grayer, MD;  Location: [facility];  Service: Cardiovascular;  Laterality: N/A;  . PARTIAL PROCTECTOMY BY TEM N/A 04/08/2015   Procedure: TEM PARTIAL PROCTECTOMY OF RECTAL MASS;  Surgeon: Michael Boston, MD;  Location: [facility];  Service: General;  Laterality: N/A;  . TEE WITHOUT CARDIOVERSION N/A  06/25/2017   Procedure: TRANSESOPHAGEAL ECHOCARDIOGRAM (TEE);  Surgeon: Skeet Latch, MD;  Location: [facility];  Service: Cardiovascular;  Laterality: N/A;  . TONSILLECTOMY AND ADENOIDECTOMY     as child    Social History   Socioeconomic History  . Marital status: Divorced    Spouse name: Not on file  . Number of children: 2  . Years of education: Not on file  . Highest education level: Not on file  Occupational History  . Occupation: retired    Fish farm manager: [employer]  Social Needs  . Financial resource strain: Not on file  . Food insecurity:    Worry: Not on file    Inability: Not on file  . Transportation needs:    Medical: Not on file    Non-medical: Not on file  Tobacco Use  . Smoking status: Former Smoker    Types: Cigarettes    Last attempt to quit: 08/13/1994    Years since quitting: 23.4  . Smokeless tobacco: Never Used  Substance and Sexual Activity  . Alcohol use: No    Alcohol/week: 0.0 oz  . Drug use: No  . Sexual activity: Not Currently  Lifestyle  . Physical activity:    Days per week: Not on file    Minutes per session: Not on file  . Stress: Not on file  Relationships  . Social connections:    Talks on phone: Not on file    Gets  together: Not on file    Attends religious service: Not on file    Active member of club or organization: Not on file    Attends meetings of clubs or organizations: Not on file    Relationship status: Not on file  . Intimate partner violence:    Fear of current or ex partner: Not on file    Emotionally abused: Not on file    Physically abused: Not on file    Forced sexual activity: Not on file  Other Topics Concern  . Not on file  Social History Narrative  . Not on file    Current Outpatient Medications on File Prior to Visit  Medication Sig Dispense Refill  . clopidogrel (PLAVIX) 75 MG tablet TAKE 1 TABLET BY MOUTH EVERY DAY 30 tablet 10  . diltiazem (CARDIZEM CD) 240 MG 24 hr capsule Take 1 capsule (240 mg total)  by mouth daily. 30 capsule 2  . fenofibrate micronized (LOFIBRA) 67 MG capsule Take 1 capsule (67 mg total) by mouth daily before breakfast. 90 capsule 1  . glucose blood (ONE TOUCH ULTRA TEST) test strip 1 each by Other route 2 (two) times daily. And lancets 2/day 200 each 3  . losartan-hydrochlorothiazide (HYZAAR) 100-25 MG tablet TAKE 1 TABLET DAILY 90 tablet 0  . metoprolol succinate (TOPROL-XL) 25 MG 24 hr tablet TAKE 1 TABLET DAILY 90 tablet 3  . tamsulosin (FLOMAX) 0.4 MG CAPS capsule Take 1 capsule (0.4 mg total) by mouth daily. 30 capsule 5  . ONETOUCH DELICA LANCETS 26O MISC      No current facility-administered medications on file prior to visit.     No Known Allergies  Family History  Problem Relation Age of Onset  . Cancer Father        "all over"  . Coronary artery disease Brother   . Diabetes Brother   . Stroke Mother   . Diabetes Mother   . Cancer Brother   . Colon cancer Neg Hx   . Esophageal cancer Neg Hx   . Rectal cancer Neg Hx   . Stomach cancer Neg Hx     BP (!) 146/82 (BP Location: Left Arm, Patient Position: Sitting, Cuff Size: Normal)   Pulse (!) 59   Temp 97.6 F (36.4 C) (Oral)   Ht 5\' 7"  (1.702 m)   Wt 220 lb (99.8 kg)   SpO2 96%   BMI 34.46 kg/m    Review of Systems He denies hypoglycemia.      Objective:   Physical Exam VITAL SIGNS:  See vs page.   GENERAL: no distress.  Pulses: foot pulses are intact bilaterally.   MSK: no deformity of the feet or ankles.  CV: 1+ bilat edema of the legs.  Skin:  no ulcer on the feet or ankles.  normal temp on the feet and ankle, but there is hyperpigmentation and erythema of the legs.   Neuro: sensation is intact to touch on the feet and ankles.    Lab Results  Component Value Date   HGBA1C 9.7 01/28/2018      Assessment & Plan:  Insulin-requiring type 2 DM: worse  CVA in 2018: insulin need is increasing again since hospitalization. Renal failure: the pattern of cbg's indicates the need to  increase humalog rather than lantus.  Patient Instructions  Your blood pressure is high today.  Please see your primary care provider soon, to have it rechecked. check your blood sugar twice a day.  vary the time of  day when you check, between before the 3 meals, and at bedtime.  also check if you have symptoms of your blood sugar being too high or too low.  please keep a record of the readings and bring it to your next appointment here (or you can bring the meter itself).  You can write it on any piece of paper.  please call us sooner if your blood sugar goes below 70, or if you have a lot of readings over 200. Please change the insulins to the numbers noted below.    Please come back for a follow-up appointment in 2 months.

## 2018-01-30 ENCOUNTER — Telehealth: Payer: Self-pay | Admitting: Internal Medicine

## 2018-01-30 ENCOUNTER — Other Ambulatory Visit: Payer: Self-pay | Admitting: Internal Medicine

## 2018-01-30 DIAGNOSIS — H01022 Squamous blepharitis right lower eyelid: Secondary | ICD-10-CM | POA: Diagnosis not present

## 2018-01-30 DIAGNOSIS — H01021 Squamous blepharitis right upper eyelid: Secondary | ICD-10-CM | POA: Diagnosis not present

## 2018-01-30 DIAGNOSIS — H01025 Squamous blepharitis left lower eyelid: Secondary | ICD-10-CM | POA: Diagnosis not present

## 2018-01-30 DIAGNOSIS — H04123 Dry eye syndrome of bilateral lacrimal glands: Secondary | ICD-10-CM | POA: Diagnosis not present

## 2018-01-30 DIAGNOSIS — H2513 Age-related nuclear cataract, bilateral: Secondary | ICD-10-CM | POA: Diagnosis not present

## 2018-01-30 DIAGNOSIS — H01024 Squamous blepharitis left upper eyelid: Secondary | ICD-10-CM | POA: Diagnosis not present

## 2018-01-30 DIAGNOSIS — E119 Type 2 diabetes mellitus without complications: Secondary | ICD-10-CM | POA: Diagnosis not present

## 2018-01-30 NOTE — Telephone Encounter (Signed)
Copied from [place] [id number]. Topic: General - Other >> Jan 30, 2018 12:46 PM Margot Ables wrote: Reason for CRM: Offering pharmacy benefit service for personalized medicine testing for plavix. This is a cheek swab test kit that is sent to the pt if the doctor authorizes and the patient agrees. Pt did not request but this is a pharmacy benefit. Ref# [id number].

## 2018-01-30 NOTE — Telephone Encounter (Signed)
I don't think he needs this but can have this done if he wants.

## 2018-01-31 ENCOUNTER — Other Ambulatory Visit: Payer: Self-pay | Admitting: *Deleted

## 2018-01-31 NOTE — Telephone Encounter (Signed)
Spoke to Arlyss Gandy at [facility] that we are not authorizing the testing

## 2018-02-01 ENCOUNTER — Telehealth: Payer: Self-pay | Admitting: *Deleted

## 2018-02-01 NOTE — Telephone Encounter (Signed)
Spoke with patient to request manual transmission for review.  Manual transmission successful.  All 3 "AF" ECGs are false--SR w/PACs.  ECGs printed and placed in Dr. Otilio Connors folder for review.

## 2018-02-03 ENCOUNTER — Other Ambulatory Visit: Payer: Self-pay | Admitting: Internal Medicine

## 2018-02-03 DIAGNOSIS — I10 Essential (primary) hypertension: Secondary | ICD-10-CM

## 2018-02-04 NOTE — Progress Notes (Signed)
[facility]  PATIENT: Jake Gross DOB: 19-Apr-1941   REASON FOR VISIT: Follow-up for stroke in September 2018 with risk factors of hypertension diabetes obesity hyperlipidemia HISTORY FROM: Patient    HISTORY OF PRESENT ILLNESS:UPDATE 4/23/2019CM [name], 77 year old male returns for follow-up with history of stroke in September 2018.  He is currently on Plavix for secondary stroke prevention without further stroke or TIA symptoms.  He has no signs of bleeding.  He remains on Crestor without muscle aches.  Loop recorder so far no atrial fibrillation.  Blood pressure in the office today 159/80 he claims he is compliant with his medications.  He checks his CBGs daily recent readings 114-234.  Most recent hemoglobin A1c  01/28/2018 was 9.7.  Most recent LDL 61 on 12/14/2017.  He is not exercising and was encouraged to do so.  He returns for reevaluation 08/07/17 PSMr. Quimby is a pleasant 77 year old Caucasian male seen today for the first office follow-up visit following hospital admission for stroke in September 2018. History is obtained from the patient, review of electronic medical records and have personally reviewed imaging films.Jake Gross an 77 y.o.malewith hypertension, hyperlipidemia, RA, and diabetes presenting by [facility] to the ED for sudden-onsetR-sided weakness. He attempted to stand up and fell. MRI showed a left parietal stroke.Patient was admitted and stroke workup was initiated. Date last known well: Date: 06/20/2017 Time last known well: Time: 13:00 NIHSSstroke scale of 0.Patient was not administered IV t-PA secondary to arriving outside of the tPA treatment window. He was admitted to General Neurology for further evaluation and treatment. MRI scan of the brain showed posterior left insular and superior parietal cortex infarct extending into the subcortical white matter. MRA of the brain showed no large vessel stenosis or occlusion. Carotid  Doppler showed no significant extracranial stenosis. Transthoracic echo showed normal ejection fraction. LDL cholesterol 56 g percent and hemoglobin A1c was elevated at 7.6. Patient was started on Plavix for stroke prevention. He had outpatient transesophageal echocardiogram done which showed no Growth of embolism. Plan was to have outpatient loop recorder inserted but due to some misunderstanding that has not happened. The patient does recall there was a phone call from cardiology to call them but he did not call them. Patient has noticed improvement in his right hand strength. Still getting home occupational and physical therapy. His block walking and gait have improved back to baseline. He still has some diminished fine motor skills and weakness in the right hand. He states his sugars are not yet well controlled and fasting sugar recently wasn't greater than 200. His Lantus insulin dose has been increased recently by primary physician. His blood pressure is also been running high in the dose of his Cardizem was also increased from 120 to 180mg  recently. He is tolerating Crestor well without muscle aches and pains. He has not yet had outpatient loop recorder done. He is tolerating Plavix well without bruising or bleeding.    REVIEW OF SYSTEMS: Full 14 system review of systems performed and notable only for those listed, all others are neg:  Constitutional: neg  Cardiovascular: neg Ear/Nose/Throat: neg  Skin: neg Eyes: neg Respiratory: neg Gastroitestinal: neg  Hematology/Lymphatic: neg  Endocrine: neg Musculoskeletal:neg Allergy/Immunology: neg Neurological: neg Psychiatric: neg Sleep : neg   ALLERGIES: No Known Allergies  HOME MEDICATIONS: Outpatient Medications Prior to Visit  Medication Sig Dispense Refill  . clopidogrel (PLAVIX) 75 MG tablet TAKE 1 TABLET BY MOUTH EVERY DAY 30 tablet 10  . diltiazem (CARDIZEM  CD) 240 MG 24 hr capsule Take 1 capsule (240 mg total) by mouth daily. 30  capsule 2  . fenofibrate micronized (LOFIBRA) 67 MG capsule Take 1 capsule (67 mg total) by mouth daily before breakfast. 90 capsule 1  . glucose blood (ONE TOUCH ULTRA TEST) test strip 1 each by Other route 2 (two) times daily. And lancets 2/day 200 each 3  . Insulin Glargine (LANTUS SOLOSTAR) 100 UNIT/ML Solostar Pen Inject 40 Units into the skin at bedtime. 15 pen 11  . insulin lispro (HUMALOG KWIKPEN) 100 UNIT/ML KiwkPen 3 times a day (just before each meal) 55-25-55 units, and and pen needles 4/day 75 pen 11  . losartan-hydrochlorothiazide (HYZAAR) 100-25 MG tablet TAKE 1 TABLET DAILY 90 tablet 0  . metoprolol succinate (TOPROL-XL) 25 MG 24 hr tablet TAKE 1 TABLET DAILY 90 tablet 3  . ONETOUCH DELICA LANCETS 44R MISC     . rosuvastatin (CRESTOR) 20 MG tablet TAKE 1 TABLET AT BEDTIME 90 tablet 2  . tamsulosin (FLOMAX) 0.4 MG CAPS capsule Take 1 capsule (0.4 mg total) by mouth daily. (Patient not taking: Reported on 02/05/2018) 30 capsule 5   No facility-administered medications prior to visit.     PAST MEDICAL HISTORY: Past Medical History:  Diagnosis Date  . 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 Arthritis   . Cervical spondylosis   . Diabetes mellitus   . Hyperlipidemia   . Hypertension   . Stroke ([place]) 06/19/2017  . Vitamin D deficiency     PAST SURGICAL HISTORY: Past Surgical History:  Procedure Laterality Date  . ABCESS DRAINAGE Left    buttocks  . CARDIOVASCULAR STRESS TEST  10/12/1999   EF 63%. NO ISCHEMIA  . COLONOSCOPY W/ POLYPECTOMY     5 polyps  . EUS N/A 03/11/2015   Procedure: LOWER ENDOSCOPIC ULTRASOUND (EUS);  Surgeon: Milus Banister, MD;  Location: [facility];  Service: Endoscopy;  Laterality: N/A;  . FLEXIBLE SIGMOIDOSCOPY N/A 02/02/2015   Procedure: FLEXIBLE SIGMOIDOSCOPY;  Surgeon: Inda Castle, MD;  Location: [facility];  Service: Endoscopy;  Laterality: N/A;  ERBE  . LOOP RECORDER INSERTION N/A  08/14/2017   Procedure: LOOP RECORDER INSERTION;  Surgeon: Thompson Grayer, MD;  Location: [facility];  Service: Cardiovascular;  Laterality: N/A;  . PARTIAL PROCTECTOMY BY TEM N/A 04/08/2015   Procedure: TEM PARTIAL PROCTECTOMY OF RECTAL MASS;  Surgeon: Michael Boston, MD;  Location: [facility];  Service: General;  Laterality: N/A;  . TEE WITHOUT CARDIOVERSION N/A 06/25/2017   Procedure: TRANSESOPHAGEAL ECHOCARDIOGRAM (TEE);  Surgeon: Skeet Latch, MD;  Location: [facility];  Service: Cardiovascular;  Laterality: N/A;  . TONSILLECTOMY AND ADENOIDECTOMY     as child    FAMILY HISTORY: Family History  Problem Relation Age of Onset  . Cancer Father        "all over"  . Coronary artery disease Brother   . Diabetes Brother   . Stroke Mother   . Diabetes Mother   . Cancer Brother   . Colon cancer Neg Hx   . Esophageal cancer Neg Hx   . Rectal cancer Neg Hx   . Stomach cancer Neg Hx     SOCIAL HISTORY: Social History   Socioeconomic History  . Marital status: Divorced    Spouse name: Not on file  . Number of children: 2  . Years of education: Not on file  . Highest education level: Not on file  Occupational  History  . Occupation: retired    Fish farm manager: [employer]  Social Needs  . Financial resource strain: Not on file  . Food insecurity:    Worry: Not on file    Inability: Not on file  . Transportation needs:    Medical: Not on file    Non-medical: Not on file  Tobacco Use  . Smoking status: Former Smoker    Types: Cigarettes    Last attempt to quit: 08/13/1994    Years since quitting: 23.4  . Smokeless tobacco: Never Used  Substance and Sexual Activity  . Alcohol use: No    Alcohol/week: 0.0 oz  . Drug use: No  . Sexual activity: Not Currently  Lifestyle  . Physical activity:    Days per week: Not on file    Minutes per session: Not on file  . Stress: Not on file  Relationships  . Social connections:    Talks on phone: Not on file    Gets together: Not on file      Attends religious service: Not on file    Active member of club or organization: Not on file    Attends meetings of clubs or organizations: Not on file    Relationship status: Not on file  . Intimate partner violence:    Fear of current or ex partner: Not on file    Emotionally abused: Not on file    Physically abused: Not on file    Forced sexual activity: Not on file  Other Topics Concern  . Not on file  Social History Narrative  . Not on file     PHYSICAL EXAM  Vitals:   02/05/18 1236  BP: (!) 159/80  Pulse: (!) 57  Weight: 221 lb 9.6 oz (100.5 kg)  Height: 5\' 7"  (1.702 m)   Body mass index is 34.71 kg/m.  Generalized: Well developed, obese male in no acute distress  Head: normocephalic and atraumatic,. Oropharynx benign  Neck: Supple, no carotid bruits  Cardiac: Regular rate rhythm, no murmur  Musculoskeletal: No deformity   Neurological examination   Mentation: Alert oriented to time, place, history taking. Attention span and concentration appropriate. Recent and remote memory intact.  Follows all commands speech and language fluent.   Cranial nerve II-XII: Pupils were equal round reactive to light extraocular movements were full, visual field were full on confrontational test. Facial sensation and strength were normal. hearing was intact to finger rubbing bilaterally. Uvula tongue midline. head turning and shoulder shrug were normal and symmetric.Tongue protrusion into cheek strength was normal. Motor: normal bulk and tone, full strength in the BUE, BLE,  Sensory: normal and symmetric to light touch,  Coordination: finger-nose-finger, heel-to-shin bilaterally, no dysmetria Reflexes: 1+ upper and lower and symmetric except depressed ankle jerks , plantar responses were flexor bilaterally. Gait and Station: Rising up from seated position without assistance, normal stance,  moderate stride, good arm swing, smooth turning, able to perform tiptoe, and heel walking  without difficulty. Tandem gait is unsteady  DIAGNOSTIC DATA (LABS, IMAGING, TESTING) - I reviewed patient records, labs, notes, testing and imaging myself where available.  Lab Results  Component Value Date   WBC 10.1 12/05/2017   HGB 14.2 12/05/2017   HCT 43.0 12/05/2017   MCV 85.7 12/05/2017   PLT 247.0 12/05/2017      Component Value Date/Time   NA 140 12/05/2017 1542   NA 141 01/19/2016   K 5.3 (H) 12/14/2017 1353   CL 107 12/05/2017 1542   CO2  26 12/05/2017 1542   GLUCOSE 163 (H) 12/05/2017 1542   BUN 25 (H) 12/05/2017 1542   BUN 34 (A) 01/19/2016   CREATININE 1.32 12/05/2017 1542   CREATININE 1.14 11/07/2016 1514   CALCIUM 9.3 12/05/2017 1542   PROT 6.1 12/05/2017 1542   ALBUMIN 3.1 (L) 12/05/2017 1542   AST 19 12/05/2017 1542   ALT 12 12/05/2017 1542   ALKPHOS 83 12/05/2017 1542   BILITOT 0.6 12/05/2017 1542   GFRNONAA >60 06/22/2017 0516   GFRAA >60 06/22/2017 0516   Lab Results  Component Value Date   CHOL 123 12/14/2017   HDL 28.70 (L) 12/14/2017   LDLCALC 56 06/22/2017   LDLDIRECT 61.0 12/14/2017   TRIG 296.0 (H) 12/14/2017   CHOLHDL 4 12/14/2017   Lab Results  Component Value Date   HGBA1C 9.7 01/28/2018    ASSESSMENT AND PLAN   81 year Caucasian male with 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 Multiple vascular risk factors of hypertension, diabetes, hyperlipidemia and obesity.    PLAN: Stressed the importance of management of risk factors to prevent further stroke Continue Plavix for secondary stroke prevention Maintain strict control of hypertension with blood pressure goal below 130/90, today's reading 159/80 continue antihypertensive medications Control of diabetes with hemoglobin A1c below 6.5 continue diabetic medications and checking CBGs Cholesterol with LDL cholesterol less than 70,  continue statin drug Crestor  Exercise by walking, recommend 30 minutes daily   eat healthy diet with  whole grains,  fresh fruits and vegetables Follow-up with primary care for stroke risk factor modification, maintain blood pressure goal less than 846 systolic, diabetes with K5L below 7, lipids with LDL below 70 Loop recorder so far no atrial fibrillation Follow-up in 6 months I spent 25 min  in total face to face time with the patient more than 50% of which was spent counseling and coordination of care, reviewing test results reviewing medications and discussing and reviewing the diagnosis of stroke and management of risk factors Dennie Bible, [facility], [facility], APRN  [facility] [address] [place], [place] [address] [phone]

## 2018-02-05 ENCOUNTER — Encounter: Payer: Self-pay | Admitting: Nurse Practitioner

## 2018-02-05 ENCOUNTER — Ambulatory Visit (INDEPENDENT_AMBULATORY_CARE_PROVIDER_SITE_OTHER): Payer: Medicare Other | Admitting: Nurse Practitioner

## 2018-02-05 VITALS — BP 159/80 | HR 57 | Ht 67.0 in | Wt 221.6 lb

## 2018-02-05 DIAGNOSIS — I6389 Other cerebral infarction: Secondary | ICD-10-CM | POA: Diagnosis not present

## 2018-02-05 DIAGNOSIS — E785 Hyperlipidemia, unspecified: Secondary | ICD-10-CM

## 2018-02-05 DIAGNOSIS — I10 Essential (primary) hypertension: Secondary | ICD-10-CM | POA: Diagnosis not present

## 2018-02-05 DIAGNOSIS — E119 Type 2 diabetes mellitus without complications: Secondary | ICD-10-CM | POA: Diagnosis not present

## 2018-02-05 DIAGNOSIS — Z794 Long term (current) use of insulin: Secondary | ICD-10-CM | POA: Diagnosis not present

## 2018-02-05 NOTE — Patient Instructions (Signed)
Stressed the importance of management of risk factors to prevent further stroke Continue Plavix for secondary stroke prevention Maintain strict control of hypertension with blood pressure goal below 130/90, today's reading 159/80 continue antihypertensive medications Control of diabetes with hemoglobin A1c below 6.5 followed by primary continue diabetic medications and checking CBGs Cholesterol with LDL cholesterol less than 70, followed by primary care, continue statin drug Crestor  Exercise by walking, recommend 30 minutes daily   eat healthy diet with whole grains,  fresh fruits and vegetables Follow-up with primary care for stroke risk factor modification, maintain blood pressure goal less than 377 systolic, diabetes with P3P below 7, lipids with LDL below 70 Loop recorder so far no atrial fibrillation Follow-up in 6 months

## 2018-02-08 NOTE — Progress Notes (Signed)
I agree with the above plan 

## 2018-02-11 ENCOUNTER — Other Ambulatory Visit: Payer: Self-pay | Admitting: Endocrinology

## 2018-02-19 ENCOUNTER — Ambulatory Visit (INDEPENDENT_AMBULATORY_CARE_PROVIDER_SITE_OTHER): Payer: Medicare Other | Admitting: *Deleted

## 2018-02-19 DIAGNOSIS — I639 Cerebral infarction, unspecified: Secondary | ICD-10-CM

## 2018-02-20 LAB — CUP PACEART REMOTE DEVICE CHECK
Implantable Pulse Generator Implant Date: 20181030
MDC IDC SESS DTM: 20190404174129

## 2018-02-20 NOTE — Progress Notes (Signed)
Carelink Summary Report / Loop Recorder 

## 2018-02-21 ENCOUNTER — Other Ambulatory Visit: Payer: Self-pay | Admitting: Internal Medicine

## 2018-02-24 ENCOUNTER — Other Ambulatory Visit: Payer: Self-pay | Admitting: Internal Medicine

## 2018-02-24 DIAGNOSIS — I10 Essential (primary) hypertension: Secondary | ICD-10-CM

## 2018-02-25 MED ORDER — DILTIAZEM HCL ER COATED BEADS 240 MG PO CP24
240.0000 mg | ORAL_CAPSULE | Freq: Every day | ORAL | 0 refills | Status: DC
Start: 2018-02-25 — End: 2018-03-27

## 2018-02-27 ENCOUNTER — Other Ambulatory Visit: Payer: Self-pay | Admitting: Internal Medicine

## 2018-03-06 ENCOUNTER — Other Ambulatory Visit: Payer: Medicare Other

## 2018-03-08 ENCOUNTER — Other Ambulatory Visit: Payer: Self-pay | Admitting: Internal Medicine

## 2018-03-12 ENCOUNTER — Other Ambulatory Visit: Payer: Medicare Other

## 2018-03-12 LAB — CUP PACEART REMOTE DEVICE CHECK
Implantable Pulse Generator Implant Date: 20181030
MDC IDC SESS DTM: 20190507180945

## 2018-03-15 ENCOUNTER — Telehealth: Payer: Self-pay | Admitting: Cardiology

## 2018-03-15 NOTE — Telephone Encounter (Signed)
Spoke w/ pt and requested that he send a manual transmission b/c his home monitor has not updated in at least 14 days.   

## 2018-03-18 ENCOUNTER — Telehealth: Payer: Self-pay | Admitting: *Deleted

## 2018-03-18 NOTE — Telephone Encounter (Signed)
Spoke with patient to assist him with sending a manual [facility] transmission for review.  Transmission successful, all available "AF" ECGs suggest SR w/PACs, not true AF.  Patient appreciative of call and denies additional questions or concerns at this time.

## 2018-03-22 ENCOUNTER — Telehealth: Payer: Self-pay | Admitting: Radiology

## 2018-03-22 ENCOUNTER — Other Ambulatory Visit (INDEPENDENT_AMBULATORY_CARE_PROVIDER_SITE_OTHER): Payer: Medicare Other

## 2018-03-22 ENCOUNTER — Other Ambulatory Visit: Payer: Self-pay | Admitting: Internal Medicine

## 2018-03-22 DIAGNOSIS — E782 Mixed hyperlipidemia: Secondary | ICD-10-CM

## 2018-03-22 LAB — COMPREHENSIVE METABOLIC PANEL
ALK PHOS: 70 U/L (ref 39–117)
ALT: 14 U/L (ref 0–53)
AST: 19 U/L (ref 0–37)
Albumin: 3.2 g/dL — ABNORMAL LOW (ref 3.5–5.2)
BILIRUBIN TOTAL: 0.6 mg/dL (ref 0.2–1.2)
BUN: 30 mg/dL — AB (ref 6–23)
CO2: 25 meq/L (ref 19–32)
CREATININE: 1.68 mg/dL — AB (ref 0.40–1.50)
Calcium: 9.3 mg/dL (ref 8.4–10.5)
Chloride: 104 mEq/L (ref 96–112)
GFR: 42.52 mL/min — ABNORMAL LOW (ref >60.00)
GLUCOSE: 405 mg/dL — AB (ref 70–99)
Potassium: 6.3 mEq/L (ref 3.5–5.1)
SODIUM: 135 meq/L (ref 135–145)
TOTAL PROTEIN: 6.4 g/dL (ref 6.0–8.3)

## 2018-03-22 LAB — LIPID PANEL
Cholesterol: 120 mg/dL (ref 0–200)
HDL: 26.2 mg/dL — ABNORMAL LOW (ref >39.00)
NONHDL: 94.1
TRIGLYCERIDES: 382 mg/dL — AB (ref 0.0–149.0)
Total CHOL/HDL Ratio: 5
VLDL: 76.4 mg/dL — ABNORMAL HIGH (ref 0.0–40.0)

## 2018-03-22 LAB — LDL CHOLESTEROL, DIRECT: Direct LDL: 54 mg/dL

## 2018-03-22 MED ORDER — SODIUM POLYSTYRENE SULFONATE PO POWD
Freq: Once | ORAL | 0 refills | Status: AC
Start: 2018-03-22 — End: 2018-03-22

## 2018-03-22 NOTE — Telephone Encounter (Signed)
Can we have him come in for a STAT potassium-redraw?

## 2018-03-22 NOTE — Telephone Encounter (Signed)
[facility] lab called a critical K+- 6.3, results given to Webb Silversmith, NP.

## 2018-03-25 ENCOUNTER — Other Ambulatory Visit: Payer: Self-pay | Admitting: Internal Medicine

## 2018-03-25 ENCOUNTER — Encounter (HOSPITAL_COMMUNITY): Payer: Self-pay | Admitting: Family Medicine

## 2018-03-25 ENCOUNTER — Inpatient Hospital Stay (HOSPITAL_COMMUNITY)
Admission: EM | Admit: 2018-03-25 | Discharge: 2018-03-27 | DRG: 641 | Disposition: A | Discharge status: Home / Self Care | Payer: Medicare Other | Attending: Family Medicine | Admitting: Family Medicine

## 2018-03-25 ENCOUNTER — Other Ambulatory Visit: Payer: Self-pay

## 2018-03-25 ENCOUNTER — Other Ambulatory Visit (INDEPENDENT_AMBULATORY_CARE_PROVIDER_SITE_OTHER): Payer: Medicare Other

## 2018-03-25 ENCOUNTER — Ambulatory Visit (INDEPENDENT_AMBULATORY_CARE_PROVIDER_SITE_OTHER): Payer: Medicare Other | Admitting: *Deleted

## 2018-03-25 DIAGNOSIS — M1990 Unspecified osteoarthritis, unspecified site: Secondary | ICD-10-CM | POA: Diagnosis present

## 2018-03-25 DIAGNOSIS — Z85038 Personal history of other malignant neoplasm of large intestine: Secondary | ICD-10-CM | POA: Diagnosis not present

## 2018-03-25 DIAGNOSIS — Z823 Family history of stroke: Secondary | ICD-10-CM | POA: Diagnosis not present

## 2018-03-25 DIAGNOSIS — I10 Essential (primary) hypertension: Secondary | ICD-10-CM | POA: Diagnosis not present

## 2018-03-25 DIAGNOSIS — E559 Vitamin D deficiency, unspecified: Secondary | ICD-10-CM | POA: Diagnosis present

## 2018-03-25 DIAGNOSIS — N401 Enlarged prostate with lower urinary tract symptoms: Secondary | ICD-10-CM | POA: Diagnosis present

## 2018-03-25 DIAGNOSIS — R351 Nocturia: Secondary | ICD-10-CM | POA: Diagnosis present

## 2018-03-25 DIAGNOSIS — Z87891 Personal history of nicotine dependence: Secondary | ICD-10-CM | POA: Diagnosis not present

## 2018-03-25 DIAGNOSIS — Z8673 Personal history of transient ischemic attack (TIA), and cerebral infarction without residual deficits: Secondary | ICD-10-CM | POA: Diagnosis not present

## 2018-03-25 DIAGNOSIS — R739 Hyperglycemia, unspecified: Secondary | ICD-10-CM

## 2018-03-25 DIAGNOSIS — Z794 Long term (current) use of insulin: Secondary | ICD-10-CM | POA: Diagnosis not present

## 2018-03-25 DIAGNOSIS — Z833 Family history of diabetes mellitus: Secondary | ICD-10-CM

## 2018-03-25 DIAGNOSIS — Z7902 Long term (current) use of antithrombotics/antiplatelets: Secondary | ICD-10-CM | POA: Diagnosis not present

## 2018-03-25 DIAGNOSIS — N179 Acute kidney failure, unspecified: Secondary | ICD-10-CM | POA: Diagnosis present

## 2018-03-25 DIAGNOSIS — R001 Bradycardia, unspecified: Secondary | ICD-10-CM | POA: Diagnosis present

## 2018-03-25 DIAGNOSIS — E860 Dehydration: Secondary | ICD-10-CM | POA: Diagnosis present

## 2018-03-25 DIAGNOSIS — Z8249 Family history of ischemic heart disease and other diseases of the circulatory system: Secondary | ICD-10-CM

## 2018-03-25 DIAGNOSIS — E1165 Type 2 diabetes mellitus with hyperglycemia: Secondary | ICD-10-CM | POA: Diagnosis present

## 2018-03-25 DIAGNOSIS — M47812 Spondylosis without myelopathy or radiculopathy, cervical region: Secondary | ICD-10-CM | POA: Diagnosis present

## 2018-03-25 DIAGNOSIS — E875 Hyperkalemia: Secondary | ICD-10-CM | POA: Diagnosis not present

## 2018-03-25 DIAGNOSIS — E119 Type 2 diabetes mellitus without complications: Secondary | ICD-10-CM | POA: Diagnosis not present

## 2018-03-25 DIAGNOSIS — E785 Hyperlipidemia, unspecified: Secondary | ICD-10-CM | POA: Diagnosis present

## 2018-03-25 DIAGNOSIS — I639 Cerebral infarction, unspecified: Secondary | ICD-10-CM | POA: Diagnosis not present

## 2018-03-25 DIAGNOSIS — Z809 Family history of malignant neoplasm, unspecified: Secondary | ICD-10-CM

## 2018-03-25 LAB — CBC WITH DIFFERENTIAL/PLATELET
Abs Immature Granulocytes: 0 10*3/uL (ref 0.0–0.1)
BASOS ABS: 0.1 10*3/uL (ref 0.0–0.1)
BASOS PCT: 1 %
EOS ABS: 0.2 10*3/uL (ref 0.0–0.7)
Eosinophils Relative: 2 %
HCT: 43.9 % (ref 39.0–52.0)
Hemoglobin: 14.2 g/dL (ref 13.0–17.0)
Immature Granulocytes: 0 %
Lymphocytes Relative: 23 %
Lymphs Abs: 1.8 10*3/uL (ref 0.7–4.0)
MCH: 27.5 pg (ref 26.0–34.0)
MCHC: 32.3 g/dL (ref 30.0–36.0)
MCV: 84.9 fL (ref 78.0–100.0)
MONO ABS: 0.8 10*3/uL (ref 0.1–1.0)
MONOS PCT: 10 %
Neutro Abs: 5.1 10*3/uL (ref 1.7–7.7)
Neutrophils Relative %: 64 %
PLATELETS: 233 10*3/uL (ref 150–400)
RBC: 5.17 MIL/uL (ref 4.22–5.81)
RDW: 13 % (ref 11.5–15.5)
WBC: 7.8 10*3/uL (ref 4.0–10.5)

## 2018-03-25 LAB — COMPREHENSIVE METABOLIC PANEL
ALK PHOS: 77 U/L (ref 38–126)
ALT: 18 U/L (ref 17–63)
AST: 21 U/L (ref 15–41)
Albumin: 3 g/dL — ABNORMAL LOW (ref 3.5–5.0)
Anion gap: 8 (ref 5–15)
BILIRUBIN TOTAL: 0.7 mg/dL (ref 0.3–1.2)
BUN: 35 mg/dL — AB (ref 6–20)
CALCIUM: 9.4 mg/dL (ref 8.9–10.3)
CO2: 20 mmol/L — ABNORMAL LOW (ref 22–32)
CREATININE: 1.81 mg/dL — AB (ref 0.61–1.24)
Chloride: 105 mmol/L (ref 101–111)
GFR calc Af Amer: 40 mL/min — ABNORMAL LOW (ref >60)
GFR calc non Af Amer: 35 mL/min — ABNORMAL LOW (ref >60)
GLUCOSE: 513 mg/dL — AB (ref 65–99)
Potassium: 6.6 mmol/L (ref 3.5–5.1)
Sodium: 133 mmol/L — ABNORMAL LOW (ref 135–145)
TOTAL PROTEIN: 6.7 g/dL (ref 6.5–8.1)

## 2018-03-25 LAB — URINALYSIS, ROUTINE W REFLEX MICROSCOPIC
BACTERIA UA: NONE SEEN
Bilirubin Urine: NEGATIVE
Hgb urine dipstick: NEGATIVE
Ketones, ur: NEGATIVE mg/dL
Leukocytes, UA: NEGATIVE
Nitrite: NEGATIVE
PH: 6 (ref 5.0–8.0)
Protein, ur: 100 mg/dL — AB
Specific Gravity, Urine: 1.018 (ref 1.005–1.030)

## 2018-03-25 LAB — CBG MONITORING, ED
GLUCOSE-CAPILLARY: 296 mg/dL — AB (ref 65–99)
Glucose-Capillary: 304 mg/dL — ABNORMAL HIGH (ref 65–99)
Glucose-Capillary: 296 mg/dL — ABNORMAL HIGH (ref 65–99)
Glucose-Capillary: 280 mg/dL — ABNORMAL HIGH (ref 65–99)
Glucose-Capillary: 273 mg/dL — ABNORMAL HIGH (ref 65–99)
Glucose-Capillary: 256 mg/dL — ABNORMAL HIGH (ref 65–99)
Glucose-Capillary: 162 mg/dL — ABNORMAL HIGH (ref 65–99)

## 2018-03-25 LAB — TROPONIN I: Troponin I: <0.03 ng/mL (ref <0.03)

## 2018-03-25 LAB — POTASSIUM
POTASSIUM: 7.1 meq/L — AB (ref 3.5–5.1)
Potassium: 7.5 mmol/L (ref 3.5–5.1)

## 2018-03-25 MED ORDER — SODIUM CHLORIDE 0.9 % IV SOLN
INTRAVENOUS | Status: DC
  Administered 2018-03-25: 19:00:00 via INTRAVENOUS

## 2018-03-25 MED ORDER — INSULIN REGULAR BOLUS VIA INFUSION
0.0000 [IU] | Freq: Three times a day (TID) | INTRAVENOUS | Status: DC
  Filled 2018-03-25: qty 10

## 2018-03-25 MED ORDER — SODIUM POLYSTYRENE SULFONATE 15 GM/60ML PO SUSP
30.0000 g | Freq: Once | ORAL | Status: AC
  Administered 2018-03-26: 30 g via ORAL
  Filled 2018-03-25: qty 120

## 2018-03-25 MED ORDER — ACETAMINOPHEN 650 MG RE SUPP: 650.0000 mg | Freq: Four times a day (QID) | RECTAL | Status: DC | PRN

## 2018-03-25 MED ORDER — SODIUM CHLORIDE 0.9 % IV SOLN
INTRAVENOUS | Status: DC
  Administered 2018-03-25: 21:00:00 via INTRAVENOUS

## 2018-03-25 MED ORDER — SODIUM CHLORIDE 0.9 % IV BOLUS
500.0000 mL | Freq: Once | INTRAVENOUS | Status: AC
  Administered 2018-03-25: 500 mL via INTRAVENOUS

## 2018-03-25 MED ORDER — ENOXAPARIN SODIUM 40 MG/0.4ML ~~LOC~~ SOLN: 40.0000 mg | Freq: Every day | SUBCUTANEOUS | Status: DC

## 2018-03-25 MED ORDER — ROSUVASTATIN CALCIUM 20 MG PO TABS
20.0000 mg | ORAL_TABLET | Freq: Every day | ORAL | Status: DC
  Administered 2018-03-26 (×2): 20 mg via ORAL
  Filled 2018-03-25 (×3): qty 1

## 2018-03-25 MED ORDER — ONDANSETRON HCL 4 MG/2ML IJ SOLN: 4.0000 mg | Freq: Four times a day (QID) | INTRAMUSCULAR | Status: DC | PRN

## 2018-03-25 MED ORDER — SODIUM CHLORIDE 0.9 % IV SOLN
1.0000 g | Freq: Once | INTRAVENOUS | Status: AC
  Administered 2018-03-25: 1 g via INTRAVENOUS
  Filled 2018-03-25: qty 10

## 2018-03-25 MED ORDER — SODIUM CHLORIDE 0.9 % IV SOLN
INTRAVENOUS | Status: DC
  Administered 2018-03-25: 2.2 [IU]/h via INTRAVENOUS
  Administered 2018-03-26: 3.6 [IU]/h via INTRAVENOUS
  Filled 2018-03-25: qty 1

## 2018-03-25 MED ORDER — AMLODIPINE BESYLATE 10 MG PO TABS
10.0000 mg | ORAL_TABLET | Freq: Every day | ORAL | Status: DC
  Administered 2018-03-26 – 2018-03-27 (×3): 10 mg via ORAL
  Filled 2018-03-25 (×2): qty 1
  Filled 2018-03-25: qty 2

## 2018-03-25 MED ORDER — ACETAMINOPHEN 325 MG PO TABS: 650.0000 mg | ORAL_TABLET | Freq: Four times a day (QID) | ORAL | Status: DC | PRN

## 2018-03-25 MED ORDER — DEXTROSE 50 % IV SOLN
50.0000 mL | Freq: Once | INTRAVENOUS | Status: DC
  Filled 2018-03-25: qty 50

## 2018-03-25 MED ORDER — HYDRALAZINE HCL 20 MG/ML IJ SOLN: 5.0000 mg | Freq: Four times a day (QID) | INTRAMUSCULAR | Status: DC | PRN

## 2018-03-25 MED ORDER — INSULIN ASPART 100 UNIT/ML IV SOLN
10.0000 [IU] | Freq: Once | INTRAVENOUS | Status: AC
  Administered 2018-03-25: 10 [IU] via INTRAVENOUS
  Filled 2018-03-25: qty 0.1

## 2018-03-25 MED ORDER — FENOFIBRATE 54 MG PO TABS
54.0000 mg | ORAL_TABLET | Freq: Every day | ORAL | Status: DC
  Administered 2018-03-26 – 2018-03-27 (×2): 54 mg via ORAL
  Filled 2018-03-25 (×2): qty 1

## 2018-03-25 MED ORDER — DEXTROSE 50 % IV SOLN: 25.0000 mL | INTRAVENOUS | Status: DC | PRN

## 2018-03-25 MED ORDER — ONDANSETRON HCL 4 MG PO TABS: 4.0000 mg | ORAL_TABLET | Freq: Four times a day (QID) | ORAL | Status: DC | PRN

## 2018-03-25 MED ORDER — CLOPIDOGREL BISULFATE 75 MG PO TABS
75.0000 mg | ORAL_TABLET | Freq: Every day | ORAL | Status: DC
  Administered 2018-03-26 – 2018-03-27 (×2): 75 mg via ORAL
  Filled 2018-03-25 (×2): qty 1

## 2018-03-25 MED ORDER — DEXTROSE-NACL 5-0.45 % IV SOLN: INTRAVENOUS | Status: DC

## 2018-03-25 MED ORDER — SODIUM POLYSTYRENE SULFONATE 15 GM/60ML PO SUSP
30.0000 g | Freq: Once | ORAL | Status: AC
  Administered 2018-03-25: 30 g via ORAL
  Filled 2018-03-25: qty 120

## 2018-03-25 NOTE — ED Notes (Signed)
Admitting at bedside 

## 2018-03-25 NOTE — Progress Notes (Signed)
potssiu

## 2018-03-25 NOTE — ED Provider Notes (Signed)
[facility] EMERGENCY DEPARTMENT Provider Note   CSN: [id number] Arrival date & time: 03/25/18  1252     History   Chief Complaint Chief Complaint  Patient presents with  . Abnormal Lab    HPI Pump Back is a 77 y.o. male.  HPI Patient is being followed for hyperkalemia by his primary physician.  Had labs drawn this morning.  Office called the patient would not go to the emergency department immediately.  Potassium was 7.1.  Patient denies symptoms including lightheadedness, dizziness, muscle aches, weakness or numbness.  States he has normal urination. Past Medical History:  Diagnosis Date  . 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 Arthritis   . Cervical spondylosis   . Diabetes mellitus   . Hyperlipidemia   . Hypertension   . Stroke ([place]) 06/19/2017  . Vitamin D deficiency     Patient Active Problem List   Diagnosis Date Noted  . Hyperkalemia 03/25/2018  . AKI (acute kidney injury) ([place]) 03/25/2018  . 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 HLD (hyperlipidemia) 09/14/2015  . Essential hypertension   . 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 Arthritis 09/16/2012  . 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 ABCESS DRAINAGE Left    buttocks  . CARDIOVASCULAR STRESS TEST  10/12/1999   EF 63%. NO ISCHEMIA  . COLONOSCOPY W/ POLYPECTOMY     5 polyps  . EUS N/A 03/11/2015   Procedure: LOWER ENDOSCOPIC ULTRASOUND (EUS);  Surgeon: Milus Banister, MD;  Location: [facility];  Service: Endoscopy;  Laterality: N/A;  . FLEXIBLE SIGMOIDOSCOPY N/A 02/02/2015   Procedure: FLEXIBLE SIGMOIDOSCOPY;  Surgeon: Inda Castle, MD;  Location: [facility];  Service: Endoscopy;  Laterality: N/A;  ERBE  . LOOP RECORDER INSERTION N/A 08/14/2017   Procedure: LOOP RECORDER INSERTION;  Surgeon: Thompson Grayer, MD;  Location: [facility];  Service: Cardiovascular;  Laterality: N/A;  . PARTIAL PROCTECTOMY BY TEM N/A 04/08/2015   Procedure: TEM PARTIAL PROCTECTOMY OF RECTAL MASS;  Surgeon: Michael Boston, MD;  Location: [facility];  Service: General;  Laterality: N/A;  . TEE WITHOUT CARDIOVERSION N/A 06/25/2017   Procedure: TRANSESOPHAGEAL ECHOCARDIOGRAM (TEE);  Surgeon: Skeet Latch, MD;  Location: [facility];  Service: Cardiovascular;  Laterality: N/A;  . TONSILLECTOMY AND ADENOIDECTOMY     as child        Home Medications    Prior to Admission medications   Medication Sig Start Date End Date Taking? Authorizing Provider  clopidogrel (PLAVIX) 75 MG tablet TAKE 1 TABLET BY MOUTH EVERY DAY 12/21/17  Yes Baity, Coralie Keens, NP  diltiazem (CARDIZEM CD) 240 MG 24 hr capsule Take 1 capsule (240 mg total) by mouth daily. 02/25/18  Yes Jearld Fenton, NP  fenofibrate micronized (LOFIBRA) 67 MG capsule Take 1 capsule (67 mg total) by mouth daily before breakfast. 12/17/17 12/12/18 Yes Baity, Coralie Keens, NP  Insulin Glargine (LANTUS SOLOSTAR) 100 UNIT/ML Solostar Pen Inject 40 Units into the skin at bedtime. 01/28/18  Yes Renato Shin, MD  insulin lispro (HUMALOG KWIKPEN) 100 UNIT/ML KiwkPen 3 times a day (just before each meal) 55-25-55 units, and and pen needles 4/day 01/28/18  Yes Renato Shin, MD  losartan-hydrochlorothiazide [facility]) 100-25 MG tablet TAKE 1 TABLET DAILY 02/05/18  Yes Jearld Fenton, NP  metoprolol succinate (TOPROL-XL) 25 MG 24 hr  tablet TAKE 1 TABLET DAILY Patient taking differently: TAKE 1  25mg  TABLET DAILY 12/12/17  Yes Baity, Coralie Keens, NP  [facility] DELICA LANCETS 76L MISC  11/28/17  Yes [provider]  rosuvastatin (CRESTOR) 20 MG tablet TAKE 1 TABLET AT BEDTIME Patient taking differently: TAKE 1 TABLET  20mg   AT BEDTIME 01/30/18  Yes Baity, Coralie Keens, NP  ONE TOUCH ULTRA TEST test strip USE 1 STRIP TWICE A DAY 02/11/18   Renato Shin, MD    Family History Family  History  Problem Relation Age of Onset  . Cancer Father        "all over"  . Coronary artery disease Brother   . Diabetes Brother   . Stroke Mother   . Diabetes Mother   . Cancer Brother   . Colon cancer Neg Hx   . Esophageal cancer Neg Hx   . Rectal cancer Neg Hx   . Stomach cancer Neg Hx     Social History Social History   Tobacco Use  . Smoking status: Former Smoker    Types: Cigarettes    Last attempt to quit: 08/13/1994    Years since quitting: 23.6  . Smokeless tobacco: Never Used  Substance Use Topics  . Alcohol use: No    Alcohol/week: 0.0 oz  . Drug use: No     Allergies   Patient has no known allergies.   Review of Systems Review of Systems  Constitutional: Negative for chills, fatigue and fever.  HENT: Negative for trouble swallowing.   Eyes: Negative for visual disturbance.  Respiratory: Negative for shortness of breath.   Cardiovascular: Negative for chest pain.  Gastrointestinal: Negative for abdominal pain, diarrhea, nausea and vomiting.  Musculoskeletal: Negative for back pain, myalgias and neck pain.  Skin: Negative for rash and wound.  Neurological: Negative for dizziness, weakness, light-headedness, numbness and headaches.  All other systems reviewed and are negative.    Physical Exam Updated Vital Signs BP (!) 169/64   Pulse 64   Temp 98 F (36.7 C) (Oral)   Resp (!) 21   SpO2 100%   Physical Exam  Constitutional: He is oriented to person, place, and time. He appears well-developed and well-nourished. No distress.  HENT:  Head: Normocephalic and atraumatic.  Mouth/Throat: Oropharynx is clear and moist. No oropharyngeal exudate.  Eyes: Pupils are equal, round, and reactive to light. EOM are normal.  Neck: Normal range of motion. Neck supple.  Cardiovascular: Normal rate and regular rhythm. Exam reveals no gallop and no friction rub.  No murmur heard. Pulmonary/Chest: Effort normal and breath sounds normal. No stridor. No  respiratory distress. He has no wheezes. He has no rales. He exhibits no tenderness.  Abdominal: Soft. Bowel sounds are normal. There is no tenderness. There is no rebound and no guarding.  Musculoskeletal: Normal range of motion. He exhibits no edema or tenderness.  No lower extremity swelling, asymmetry or tenderness.  Lymphadenopathy:    He has no cervical adenopathy.  Neurological: He is alert and oriented to person, place, and time.  Moving all extremities without focal deficit.  Sensation intact.  Skin: Skin is warm and dry. No rash noted. He is not diaphoretic. No erythema.  Psychiatric: He has a normal mood and affect. His behavior is normal.  Nursing note and vitals reviewed.    ED Treatments / Results  Labs (all labs ordered are listed, but only abnormal results are displayed) Labs Reviewed  COMPREHENSIVE METABOLIC PANEL - Abnormal; Notable for the following components:  Result Value   Sodium 133 (*)    Potassium 6.6 (*)    CO2 20 (*)    Glucose, Bld 513 (*)    BUN 35 (*)    Creatinine, Ser 1.81 (*)    Albumin 3.0 (*)    GFR calc non Af Amer 35 (*)    GFR calc Af Amer 40 (*)    All other components within normal limits  URINALYSIS, ROUTINE W REFLEX MICROSCOPIC - Abnormal; Notable for the following components:   Color, Urine STRAW (*)    Glucose, UA >=500 (*)    Protein, ur 100 (*)    All other components within normal limits  POTASSIUM - Abnormal; Notable for the following components:   Potassium 7.5 (*)    All other components within normal limits  CBG MONITORING, ED - Abnormal; Notable for the following components:   Glucose-Capillary 304 (*)    All other components within normal limits  CBC WITH DIFFERENTIAL/PLATELET  TROPONIN I    EKG EKG Interpretation  Date/Time:  Monday March 25 2018 14:11:26 EDT Ventricular Rate:  52 PR Interval:  202 QRS Duration: 92 QT Interval:  386 QTC Calculation: 358 R Axis:   35 Text Interpretation:  Sinus bradycardia  Low voltage QRS Cannot rule out Anterior infarct , age undetermined Abnormal ECG Confirmed by Julianne Rice [phone]) on 03/25/2018 3:10:39 PM   Radiology No results found.  Procedures Procedures (including critical care time)  Medications Ordered in ED Medications  sodium polystyrene (KAYEXALATE) 15 GM/60ML suspension 30 g (has no administration in time range)  insulin regular bolus via infusion 0-10 Units (has no administration in time range)  insulin regular (NOVOLIN R,HUMULIN R) 100 Units in sodium chloride 0.9 % 100 mL (1 Units/mL) infusion (has no administration in time range)  dextrose 50 % solution 25 mL (has no administration in time range)  0.9 %  sodium chloride infusion (has no administration in time range)  dextrose 5 %-0.45 % sodium chloride infusion (has no administration in time range)  calcium gluconate 1 g in sodium chloride 0.9 % 100 mL IVPB (0 g Intravenous Stopped 03/25/18 1638)  insulin aspart (novoLOG) injection 10 Units (10 Units Intravenous Given 03/25/18 1617)  sodium chloride 0.9 % bolus 500 mL (500 mLs Intravenous New Bag/Given 03/25/18 1644)   CRITICAL CARE Performed by: Julianne Rice Total critical care time: 35 minutes Critical care time was exclusive of separately billable procedures and treating other patients. Critical care was necessary to treat or prevent imminent or life-threatening deterioration. Critical care was time spent personally by me on the following activities: development of treatment plan with patient and/or surrogate as well as nursing, discussions with consultants, evaluation of patient's response to treatment, examination of patient, obtaining history from patient or surrogate, ordering and performing treatments and interventions, ordering and review of laboratory studies, ordering and review of radiographic studies, pulse oximetry and re-evaluation of patient's condition.  Initial Impression / Assessment and Plan / ED Course  I have  reviewed the triage vital signs and the nursing notes.  Pertinent labs & imaging results that were available during my care of the patient were reviewed by me and considered in my medical decision making (see chart for details).    Mildly peaked T waves compared to previous EKG.  Continues to have an elevation in his creatinine.  Potassium is 6.6 with mild hemolysis.  We will give IV fluids, insulin, calcium gluconate and attempt to repeat potassium.  Repeat potassium is greater  than 7.5.  Repeat EKG with improvement of T wave spiking.  Patient remains asymptomatic.  He has having some bradycardia but blood pressure remains elevated.  Discussed with Dr. Loleta Books.  Will see patient in the emergency department admit.  Also discussed with nephrology and start patient on Kayexalate. Final Clinical Impressions(s) / ED Diagnoses   Final diagnoses:  Hyperkalemia  Hyperglycemia    ED Discharge Orders    None       Julianne Rice, MD 03/25/18 1806

## 2018-03-25 NOTE — ED Provider Notes (Signed)
Patient placed in Quick Look pathway, seen and evaluated   Chief Complaint: hyperkalemia   HPI:   Jake Gross is a 77 y.o. male who presents to the ED after his PCP called and told him the his potassium was high and he needed to come t the ED immediately. Patient reports he has to go in to his PCP for blood work every so often due to a stroke last October. Patient reports he had been doing well until this episode of abnormal blood work.    ROS: Resp: no shortness of breath  Heart: bradycardia  Physical Exam:  BP (!) 141/80 (BP Location: Right Arm)   Pulse (!) 52   Temp 98 F (36.7 C) (Oral)   Resp 16   SpO2 95%     Gen: No distress  Neuro: Awake and Alert  Skin: Warm and dry      Initiation of care has begun. The patient has been counseled on the process, plan, and necessity for staying for the completion/evaluation, and the remainder of the medical screening examination    Ashley Murrain, NP 03/25/18 Britt, MD 03/26/18 [phone]

## 2018-03-25 NOTE — Progress Notes (Signed)
Carelink Summary Report / Loop Recorder 

## 2018-03-25 NOTE — ED Notes (Signed)
Bladder scan shows 20 mLs. Multiple scans show no more than 20 mLs

## 2018-03-25 NOTE — H&P (Signed)
History and Physical  Patient Name: Jake Gross     WYO:[id number]    DOB: 02/08/1941    DOA: 03/25/2018 PCP: Jearld Fenton, NP  Patient coming from: Home  Chief Complaint: Abnormal lab      HPI: Jake Gross is a 77 y.o. male with a past medical history significant for HTN, IDDM, and CVA last Sep no residual deficits who presents with hyperkalemia.  The patient was in his usual state of health until last week when he was getting routine labs, found to have hyperkalemia, 6.3 mEq/L.  His PCP ordered short-term follow-up today, repeat today was 7.1, as well as worsening renal function from baseline and so he was sent to the ER.  Patient has been asymptomatic, no malaise, nausea, confusion.  No decreased urine output, swelling, dyspnea.  His only out of the ordinary symptom is frequent nocturia for several months.  ED course: -Afebrile 2, respirations and pulse ox normal, blood pressure 141/80 -Na 133, K 6.6, Cr 1.81 (baseline 1.1), WBC 7.8 K, Hgb 13.2, glucose 513, anion gap normal - Troponin negative - Urinalysis without leukocytes, no casts -ECG showed bradycardia, sinus, no peak T waves, no ST changes, no QRS changes -He was given calcium, insulin, normal saline, and Kayexalate, and TRH were asked to evaluate for hyperkalemia and hyperglycemia     ROS: Review of Systems  Constitutional: Negative for chills, fever and malaise/fatigue.  Respiratory: Negative for cough, shortness of breath and wheezing.   Cardiovascular: Negative for chest pain, orthopnea, leg swelling and PND.  Gastrointestinal: Negative for abdominal pain, nausea and vomiting.  Genitourinary: Positive for frequency. Negative for dysuria, flank pain, hematuria and urgency.  Skin: Negative for rash.  All other systems reviewed and are negative.         Past Medical History:  Diagnosis Date  . 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 Arthritis     . Cervical spondylosis   . Diabetes mellitus   . Hyperlipidemia   . Hypertension   . Stroke ([place]) 06/19/2017  . Vitamin D deficiency     Past Surgical History:  Procedure Laterality Date  . ABCESS DRAINAGE Left    buttocks  . CARDIOVASCULAR STRESS TEST  10/12/1999   EF 63%. NO ISCHEMIA  . COLONOSCOPY W/ POLYPECTOMY     5 polyps  . EUS N/A 03/11/2015   Procedure: LOWER ENDOSCOPIC ULTRASOUND (EUS);  Surgeon: Milus Banister, MD;  Location: [facility];  Service: Endoscopy;  Laterality: N/A;  . FLEXIBLE SIGMOIDOSCOPY N/A 02/02/2015   Procedure: FLEXIBLE SIGMOIDOSCOPY;  Surgeon: Inda Castle, MD;  Location: [facility];  Service: Endoscopy;  Laterality: N/A;  ERBE  . LOOP RECORDER INSERTION N/A 08/14/2017   Procedure: LOOP RECORDER INSERTION;  Surgeon: Thompson Grayer, MD;  Location: [place] [facility];  Service: Cardiovascular;  Laterality: N/A;  . PARTIAL PROCTECTOMY BY TEM N/A 04/08/2015   Procedure: TEM PARTIAL PROCTECTOMY OF RECTAL MASS;  Surgeon: Michael Boston, MD;  Location[facility];  Service: General;  Laterality: N/A;  . TEE WITHOUT CARDIOVERSION N/A 06/25/2017   Procedure: TRANSESOPHAGEAL ECHOCARDIOGRAM (TEE);  Surgeon: Skeet Latch, MD;  Location: [facility];  Service: Cardiovascular;  Laterality: N/A;  . [place]     as child    Social History: Patient lives alone.  The patient walks unassisted.  Remote former smoker.  He is from [place], went to [employer].  Was a mailman.  No Known Allergies  Family history: family history includes Cancer in his brother and father; Coronary artery disease in his brother; Diabetes in his brother and mother; Stroke in his mother.  Prior to Admission medications   Medication Sig Start Date End Date Taking? Authorizing Provider  clopidogrel (PLAVIX) 75 MG tablet TAKE 1 TABLET BY MOUTH EVERY DAY 12/21/17  Yes Baity, Coralie Keens, NP  diltiazem (CARDIZEM CD) 240 MG 24 hr capsule Take 1 capsule (240 mg total)  by mouth daily. 02/25/18  Yes Jearld Fenton, NP  fenofibrate micronized (LOFIBRA) 67 MG capsule Take 1 capsule (67 mg total) by mouth daily before breakfast. 12/17/17 12/12/18 Yes Baity, Coralie Keens, NP  Insulin Glargine (LANTUS SOLOSTAR) 100 UNIT/ML Solostar Pen Inject 40 Units into the skin at bedtime. 01/28/18  Yes Renato Shin, MD  insulin lispro (HUMALOG KWIKPEN) 100 UNIT/ML KiwkPen 3 times a day (just before each meal) 55-25-55 units, and and pen needles 4/day 01/28/18  Yes Renato Shin, MD  losartan-hydrochlorothiazide [facility]) 100-25 MG tablet TAKE 1 TABLET DAILY 02/05/18  Yes Jearld Fenton, NP  metoprolol succinate (TOPROL-XL) 25 MG 24 hr tablet TAKE 1 TABLET DAILY Patient taking differently: TAKE 1  25mg  TABLET DAILY 12/12/17  Yes Baity, Coralie Keens, NP  ONETOUCH DELICA LANCETS 42V [place]  11/28/17  Yes [provider]  rosuvastatin (CRESTOR) 20 MG tablet TAKE 1 TABLET AT BEDTIME Patient taking differently: TAKE 1 TABLET  20mg   AT BEDTIME 01/30/18  Yes 77, Coralie Keens, NP  ONE TOUCH ULTRA TEST test strip USE 1 STRIP TWICE A DAY 02/11/18   Renato Shin, MD       Physical Exam: BP (!) 169/64   Pulse 64   Temp 98 F (36.7 C) (Oral)   Resp (!) 21   SpO2 100%  General appearance: Well-developed, elderly adult male, alert and in no acute distress.   Eyes: Anicteric, conjunctiva pink, lids and lashes normal. PERRL.    ENT: No nasal deformity, discharge, epistaxis.  Hearing normal. OP moist without lesions.  Dentures in place.  Lips normal. Neck: No neck masses.  Trachea midline.  No thyromegaly/tenderness. Lymph: No cervical or supraclavicular lymphadenopathy. Skin: Warm and dry.  No jaundice.  No suspicious rashes or lesions. Many many AKs. Cardiac: Slow regular, nl S1-S2, no murmurs appreciated.  Capillary refill is brisk.  JVP normal.  No LE edema.  Radial pulses 2+ and symmetric. Respiratory: Normal respiratory rate and rhythm.  CTAB without rales or wheezes. Abdomen: Abdomen soft.   No TTP. No ascites, distension, hepatosplenomegaly.   MSK: No deformities or effusions in the large joints of the upper or lower extremities bilaterally.  No cyanosis or clubbing. Neuro: Cranial nerves 2 through 12 intact.  Sensation intact to light touch. Speech is fluent.  Muscle strength 5 out of 5 and symmetric in upper and lower extremities bilaterally.    Psych: Sensorium intact and responding to questions, attention normal.  Behavior appropriate.  Affect normal.  Judgment and insight appear normal.     Labs on Admission:  I have personally reviewed following labs and imaging studies: CBC: Recent Labs  Lab 03/25/18 1422  WBC 7.8  NEUTROABS 5.1  HGB 14.2  HCT 43.9  MCV 84.9  PLT 956   Basic Metabolic Panel: Recent Labs  Lab 03/22/18 0925 03/25/18 0902 03/25/18 1422 03/25/18 1616  NA 135  --  133*  --   K 6.3* 7.1* 6.6* 7.5*  CL 104  --  105  --  CO2 25  --  20*  --   GLUCOSE 405*  --  513*  --   BUN 30*  --  35*  --   CREATININE 1.68*  --  1.81*  --   CALCIUM 9.3  --  9.4  --    GFR: CrCl cannot be calculated (Unknown ideal weight.).  Liver Function Tests: Recent Labs  Lab 03/22/18 0925 03/25/18 1422  AST 19 21  ALT 14 18  ALKPHOS 70 77  BILITOT 0.6 0.7  PROT 6.4 6.7  ALBUMIN 3.2* 3.0*   Cardiac Enzymes: Recent Labs  Lab 03/25/18 1422  TROPONINI <0.03   CBG: Recent Labs  Lab 03/25/18 1722 03/25/18 1811  GLUCAP 304* 296*     EKG: Independently reviewed.  Bradycardic, sinus rhythm, no peak T waves.           Assessment/Plan  1.  Hyperkalemia:  Probably from dehydration in setting of losartan use.  No history of ischemic colitis (just a transrectal polypectomy). -Hold losartan -Repeat Kayexalate -Continue IV fluids   2.  Acute kidney injury:  Reports he has had BPH requiring Urology follow up before, not on Flomax.  On losartan. -Hold losartan, HCTZ -IV fliuds -Check FenA -Obtain bladder scan and foley if >300 cc  3.   Hyperglycemia Diabetes:  Uses 40 Lantus plus >100 units short acting per day. -Insulin gtt overnight -IV fluids -Hold Lantus and Lispro  4.  Hypertension Cerebrovascular disease secondary prevention:  -Hold losartan and HCTZ due to AKI -Hold metop and diltiazem due to bradycardia -Start amlodipine 10 -Hydralazine PRN -Continue fibrate, statin -Continue Plavix      DVT prophylaxis: Lovenox  Code Status: FULL  Family Communication: None presnet  Disposition Plan: Anticipate IV insulin overnight and kayexalate and trend labs.  IV fluids and correct electrolytes abnormalities. Consults called: None Admission status: INPATINET    At the time of admission, it appears that the appropriate admission status for this patient is INPATIENT. This is judged to be reasonable and necessary in order to provide the required intensity of service to ensure the patient's safety given the presenting symptoms, physical exam findings, and initial radiographic and laboratory data in the context of their chronic comorbidities.  Together, these circumstances are felt to place him at high risk for further clinical deterioration threatening life, limb, or organ.   Patient requires inpatient status due to high intensity of service, high risk for further deterioration and high frequency of surveillance required because of this  acute electrolyte abnormality failed outpatient treatment, associated with severe hyperkalemia and hyperglycemia requiring insulin drip and IV fluids that poses a threat to life, limb or bodily function and that at the point of admission it is my clinical judgment that the patient will require inpatient hospital care spanning beyond 2 midnights from the point of admission and that early discharge would result in unnecessary risk of decompensation and readmission or threat to life, limb or bodily function.       Medical decision making: Patient seen at 6:28 PM on 03/25/2018.  The patient  was discussed with Dr. Lita Mains.  What exists of the patient's chart was reviewed in depth and summarized above.  Clinical condition: stable.        Edwin Dada [facility] Pager [phone]

## 2018-03-25 NOTE — ED Notes (Signed)
Date and time results received: 03/25/18 1606 (use smartphrase ".now" to insert current time)  Test: K 6.6 Glucose 513   Name of Provider Notified:Dr. Lita Mains  Orders Received? Or Actions Taken?: MD in dept

## 2018-03-25 NOTE — ED Triage Notes (Signed)
Pt arrives from home after being called by physician who told him his potassium level was 7.0. Pt has no symptoms.

## 2018-03-26 ENCOUNTER — Encounter (HOSPITAL_COMMUNITY): Payer: Self-pay | Admitting: General Practice

## 2018-03-26 ENCOUNTER — Other Ambulatory Visit: Payer: Self-pay

## 2018-03-26 LAB — BASIC METABOLIC PANEL
Anion gap: 6 (ref 5–15)
Anion gap: 9 (ref 5–15)
BUN: 27 mg/dL — AB (ref 6–20)
BUN: 29 mg/dL — AB (ref 6–20)
CALCIUM: 9.7 mg/dL (ref 8.9–10.3)
CHLORIDE: 110 mmol/L (ref 101–111)
CO2: 22 mmol/L (ref 22–32)
CO2: 24 mmol/L (ref 22–32)
CREATININE: 1.44 mg/dL — AB (ref 0.61–1.24)
Calcium: 9.4 mg/dL (ref 8.9–10.3)
Chloride: 109 mmol/L (ref 101–111)
Creatinine, Ser: 1.37 mg/dL — ABNORMAL HIGH (ref 0.61–1.24)
GFR calc Af Amer: 53 mL/min — ABNORMAL LOW (ref >60)
GFR calc Af Amer: 57 mL/min — ABNORMAL LOW (ref >60)
GFR calc non Af Amer: 49 mL/min — ABNORMAL LOW (ref >60)
GFR, EST NON AFRICAN AMERICAN: 46 mL/min — AB (ref >60)
GLUCOSE: 128 mg/dL — AB (ref 65–99)
GLUCOSE: 169 mg/dL — AB (ref 65–99)
Potassium: 4.3 mmol/L (ref 3.5–5.1)
Potassium: 5.2 mmol/L — ABNORMAL HIGH (ref 3.5–5.1)
SODIUM: 141 mmol/L (ref 135–145)
Sodium: 139 mmol/L (ref 135–145)

## 2018-03-26 LAB — CBC
HEMATOCRIT: 43.5 % (ref 39.0–52.0)
Hemoglobin: 14 g/dL (ref 13.0–17.0)
MCH: 27.4 pg (ref 26.0–34.0)
MCHC: 32.2 g/dL (ref 30.0–36.0)
MCV: 85.1 fL (ref 78.0–100.0)
PLATELETS: 172 10*3/uL (ref 150–400)
RBC: 5.11 MIL/uL (ref 4.22–5.81)
RDW: 13.2 % (ref 11.5–15.5)
WBC: 8.2 10*3/uL (ref 4.0–10.5)

## 2018-03-26 LAB — CBG MONITORING, ED
GLUCOSE-CAPILLARY: 137 mg/dL — AB (ref 65–99)
GLUCOSE-CAPILLARY: 146 mg/dL — AB (ref 65–99)
GLUCOSE-CAPILLARY: 153 mg/dL — AB (ref 65–99)
GLUCOSE-CAPILLARY: 156 mg/dL — AB (ref 65–99)
GLUCOSE-CAPILLARY: 163 mg/dL — AB (ref 65–99)
Glucose-Capillary: 148 mg/dL — ABNORMAL HIGH (ref 65–99)

## 2018-03-26 LAB — GLUCOSE, CAPILLARY
GLUCOSE-CAPILLARY: 230 mg/dL — AB (ref 65–99)
GLUCOSE-CAPILLARY: 307 mg/dL — AB (ref 65–99)
Glucose-Capillary: 181 mg/dL — ABNORMAL HIGH (ref 65–99)
Glucose-Capillary: 218 mg/dL — ABNORMAL HIGH (ref 65–99)
Glucose-Capillary: 225 mg/dL — ABNORMAL HIGH (ref 65–99)
Glucose-Capillary: 286 mg/dL — ABNORMAL HIGH (ref 65–99)

## 2018-03-26 MED ORDER — INSULIN ASPART 100 UNIT/ML ~~LOC~~ SOLN
4.0000 [IU] | Freq: Three times a day (TID) | SUBCUTANEOUS | Status: DC
  Administered 2018-03-26 – 2018-03-27 (×3): 4 [IU] via SUBCUTANEOUS

## 2018-03-26 MED ORDER — DEXTROSE-NACL 5-0.45 % IV SOLN
INTRAVENOUS | Status: DC
  Administered 2018-03-26: 08:00:00 via INTRAVENOUS

## 2018-03-26 MED ORDER — INSULIN ASPART 100 UNIT/ML ~~LOC~~ SOLN
8.0000 [IU] | Freq: Once | SUBCUTANEOUS | Status: AC
  Administered 2018-03-26: 8 [IU] via SUBCUTANEOUS

## 2018-03-26 MED ORDER — HYDRALAZINE HCL 50 MG PO TABS
50.0000 mg | ORAL_TABLET | Freq: Three times a day (TID) | ORAL | Status: DC
  Administered 2018-03-26 – 2018-03-27 (×4): 50 mg via ORAL
  Filled 2018-03-26 (×4): qty 1

## 2018-03-26 MED ORDER — INSULIN ASPART 100 UNIT/ML ~~LOC~~ SOLN
0.0000 [IU] | Freq: Three times a day (TID) | SUBCUTANEOUS | Status: DC
  Administered 2018-03-26: 7 [IU] via SUBCUTANEOUS
  Administered 2018-03-26: 15 [IU] via SUBCUTANEOUS
  Administered 2018-03-27: 11 [IU] via SUBCUTANEOUS

## 2018-03-26 MED ORDER — HYDRALAZINE HCL 20 MG/ML IJ SOLN: 10.0000 mg | Freq: Four times a day (QID) | INTRAMUSCULAR | Status: DC | PRN

## 2018-03-26 MED ORDER — INSULIN GLARGINE 100 UNIT/ML ~~LOC~~ SOLN
44.0000 [IU] | Freq: Every day | SUBCUTANEOUS | Status: DC
  Administered 2018-03-26 – 2018-03-27 (×2): 44 [IU] via SUBCUTANEOUS
  Filled 2018-03-26 (×2): qty 0.44

## 2018-03-26 MED ORDER — INSULIN ASPART 100 UNIT/ML ~~LOC~~ SOLN
0.0000 [IU] | Freq: Every day | SUBCUTANEOUS | Status: DC
  Administered 2018-03-26: 3 [IU] via SUBCUTANEOUS

## 2018-03-26 MED ORDER — SODIUM CHLORIDE 0.9 % IV SOLN
INTRAVENOUS | Status: AC
  Administered 2018-03-26 – 2018-03-27 (×3): via INTRAVENOUS

## 2018-03-26 NOTE — Progress Notes (Signed)
Inpatient Diabetes Program Recommendations  AACE/ADA: New Consensus Statement on Inpatient Glycemic Control (2015)  Target Ranges:  Prepandial:   less than 140 mg/dL      Peak postprandial:   less than 180 mg/dL (1-2 hours)      Critically ill patients:  140 - 180 mg/dL   Lab Results  Component Value Date   GLUCAP 230 (H) 03/26/2018   HGBA1C 9.7 01/28/2018    Review of Glycemic ControlResults for [name], Jake Gross" (MRN [id number]) as of 03/26/2018 15:01  Ref. Range 03/26/2018 07:00 03/26/2018 08:24 03/26/2018 09:32 03/26/2018 10:26 03/26/2018 11:41  Glucose-Capillary Latest Ref Range: 65 - 99 mg/dL 148 (H) 181 (H) 225 (H) 218 (H) 230 (H)    Diabetes history: Type 2 DM  Outpatient Diabetes medications:  Lantus 40 units daily, Humalog 55 units AM, 25 units lunch, and 55 units with supper Current orders for Inpatient glycemic control:  Lantus 44 units q HS, Novolog resistant tid with meals and HS, Novolog 4 units tid with meals  Inpatient Diabetes Program Recommendations:   Note that patient admitted today with blood sugars>500 mg/dL.  Discussed glycemic control with patient.  He states that he has no idea why blood sugars were so high.  He is taking his insulin as prescribed however admits that he sometimes skips lunch insulin due to not eating.  He is only testing twice a day.  He is discouraged with increased A1C stating, I don't know why its so high.  We discussed site rotation with insulin doses and the need to vary site with every injection.  Home dose of Lantus started today upon transition off insulin drip.  Will follow.    Thanks,  Adah Perl, RN, BC-ADM Inpatient Diabetes Coordinator Pager [phone] (8a-5p)

## 2018-03-26 NOTE — Progress Notes (Signed)
Patient Demographics:    Jake Gross, is a 77 y.o. male, DOB - August 28, 1941, WUJ:[id number]  Admit date - 03/25/2018   Admitting Physician Edwin Dada, MD  Outpatient Primary MD for the patient is Jearld Fenton, NP  LOS - 1   Chief Complaint  Patient presents with  . Abnormal Lab        Subjective:    Jake Gross today has no fevers, no emesis,  No chest pain, voiding well, no new concerns  Assessment  & Plan :    Principal Problem:   Hyperkalemia Active Problems:   Diabetes mellitus type 2, insulin dependent (HCC)   Essential hypertension   AKI (acute kidney injury) [facility])  Brief Summary 77 y.o. male with a past medical history significant for HTN, IDDM, and CVA last Sep no residual deficits who was admitted on 03/25/2018 with AKI and hyperkalemia in the setting of severe hyperglycemia with glucose over 500.   Plan:-  1)AKI----acute kidney injury  --overall renal function is improved with hydration, with discontinuation of losartan/HCTZ, in retrospect AKI was triggered by dehydration in the setting of severe hyperglycemia with glucose over 500 on admission, compounded by HCTZ/losartan use   creatinine on admission= 1.81  ,   baseline creatinine =1.3    , creatinine is now=1.37      ,  Avoid nephrotoxic agents/dehydration/hypotension, change IV fluids to normal saline at 100 mm an hour  2)Severe Hyperglycemia/Uncontrolled Diabetes- overall much improved with IV fluids and insulin drip, admission glucose was 513 with a bicarb of 20, patient anion gap  Was (8) normal on admission, glucose is now below 200, anion gap remains normal below 10, and bicarb is up to 22, okay to stop dextrose solution, stop IV insulin drip, give Lantus insulin 44 units daily, use high dose sliding scale insulin coverage  3)HyperKalemia-potassium peaked at 7.5, patient received hyperkalemia protocol including  calcium chloride, dextrose and insulin as well as Kayexalate, potassium is down to 4.3, continue to hold losartan,  4)HTN-stage II, BP running somewhat higher due to discontinuation of losartan/HCTZ,, continue amlodipine 10 mg daily, add hydralazine 50 mg 3 times daily,  5)H/o CVA-no evidence of new neuro deficits, continue Plavix 75 mg and Crestor 20 mg daily``  6)Asymptomatic Bradycardia-continue to hold Cardizem and Metoprolol due to bradycardia, patient has an loop recorder, bradycardia may have been exacerbated by hyperkalemia, last known EF 55-60  Code Status : Full   Disposition Plan  : home  DVT Prophylaxis  :  - Heparin -   Lab Results  Component Value Date   PLT 172 03/26/2018    Inpatient Medications  Scheduled Meds: . amLODipine  10 mg Oral Daily  . clopidogrel  75 mg Oral Daily  . fenofibrate  54 mg Oral QAC breakfast  . hydrALAZINE  50 mg Oral TID  . insulin aspart  0-20 Units Subcutaneous TID WC  . insulin aspart  0-5 Units Subcutaneous QHS  . insulin aspart  4 Units Subcutaneous TID WC  . insulin glargine  44 Units Subcutaneous Daily  . rosuvastatin  20 mg Oral QHS   Continuous Infusions: . sodium chloride 100 mL/hr at 03/26/18 1037   PRN Meds:.acetaminophen **OR** acetaminophen, dextrose, hydrALAZINE, ondansetron **OR** ondansetron (ZOFRAN) IV  Anti-infectives (From admission, onward)   None        Objective:   Vitals:   03/26/18 0500 03/26/18 0600 03/26/18 0700 03/26/18 0802  BP: (!) 164/81 (!) 134/94 (!) 182/77 (!) 165/103  Pulse: (!) 51 (!) 44 (!) 54 64  Resp:   19 16  Temp:    (!) 97.4 F (36.3 C)  TempSrc:    Oral  SpO2: 97% 96% 99% 100%  Weight:    96.9 kg (213 lb 10 oz)  Height:    5\' 7"  (1.702 m)    Wt Readings from Last 3 Encounters:  03/26/18 96.9 kg (213 lb 10 oz)  02/05/18 100.5 kg (221 lb 9.6 oz)  01/28/18 99.8 kg (220 lb)    No intake or output data in the 24 hours ending 03/26/18 1042   Physical Exam  Gen:- Awake  Alert,  In no apparent distress  HEENT:- [place].AT, No sclera icterus Neck-Supple Neck,No JVD,.  Lungs-  CTAB , good air movement CV- S1, S2 normal, loop recorder Abd-  +ve B.Sounds, Abd Soft, No tenderness,    Extremity/Skin:- No  edema,   good pulses, dry skin Psych-affect is appropriate, oriented x3 Neuro-no new focal deficits, no tremors   Data Review:   Micro Results No results found for this or any previous visit (from the past 240 hour(s)).  Radiology Reports No results found.   CBC Recent Labs  Lab 03/25/18 1422 03/26/18 0419  WBC 7.8 8.2  HGB 14.2 14.0  HCT 43.9 43.5  PLT 233 172  MCV 84.9 85.1  MCH 27.5 27.4  MCHC 32.3 32.2  RDW 13.0 13.2  LYMPHSABS 1.8  --   MONOABS 0.8  --   EOSABS 0.2  --   BASOSABS 0.1  --     Chemistries  Recent Labs  Lab 03/22/18 0925 03/25/18 0902 03/25/18 1422 03/25/18 1616 03/25/18 2359 03/26/18 0419  NA 135  --  133*  --  139 141  K 6.3* 7.1* 6.6* 7.5* 5.2* 4.3  CL 104  --  105  --  109 110  CO2 25  --  20*  --  24 22  GLUCOSE 405*  --  513*  --  169* 128*  BUN 30*  --  35*  --  29* 27*  CREATININE 1.68*  --  1.81*  --  1.44* 1.37*  CALCIUM 9.3  --  9.4  --  9.7 9.4  AST 19  --  21  --   --   --   ALT 14  --  18  --   --   --   ALKPHOS 70  --  77  --   --   --   BILITOT 0.6  --  0.7  --   --   --    ------------------------------------------------------------------------------------------------------------------ No results for input(s): CHOL, HDL, LDLCALC, TRIG, CHOLHDL, LDLDIRECT in the last 72 hours.  Lab Results  Component Value Date   HGBA1C 9.7 01/28/2018   ------------------------------------------------------------------------------------------------------------------ No results for input(s): TSH, T4TOTAL, T3FREE, THYROIDAB in the last 72 hours.  Invalid input(s): FREET3 ------------------------------------------------------------------------------------------------------------------ No results for  input(s): VITAMINB12, FOLATE, FERRITIN, TIBC, IRON, RETICCTPCT in the last 72 hours.  Coagulation profile No results for input(s): INR, PROTIME in the last 168 hours.  No results for input(s): DDIMER in the last 72 hours.  Cardiac Enzymes Recent Labs  Lab 03/25/18 1422  TROPONINI <0.03   ------------------------------------------------------------------------------------------------------------------ No results found for: BNP   Roxan Hockey M.D on  03/26/2018 at 10:42 AM  Between 7am to 7pm - Pager - [phone]  After 7pm go to [email or web address] - password [email or web address]  [facility] -  Office  [phone]   Voice Recognition Viviann Spare dictation system was used to create this note, attempts have been made to correct errors. Please contact the author with questions and/or clarifications.

## 2018-03-26 NOTE — ED Notes (Signed)
Gave report to floor nurse  alisa, rn

## 2018-03-26 NOTE — Progress Notes (Signed)
[facility] [id number]  Code Status: FULL  Admission Data: 03/26/2018 7:32 PM  Attending Provider: Denton Brick  UKR:CVKFM, Coralie Keens, NP  Consults/ Treatment Team:   Jake Gross is a 77 y.o. male patient admitted from ED awake, alert - oriented X 4 - no acute distress noted. VSS - Blood pressure (!) 159/81, pulse 93, temperature 98.7 F (37.1 C), temperature source Oral, resp. rate 14, height 5\' 7"  (1.702 m), weight 96.9 kg (213 lb 10 oz), SpO2 96 %. no c/o shortness of breath, no c/o chest pain. Orientation to room, and floor completed with information packet given to patient. Admission INP armband ID verified with patient, and in place. Cardiac monitor in place. Glucostabilizer started and insulin drip infusing. SR up x 2, fall assessment complete, with patient and family able to verbalize understanding of risk associated with falls, and verbalized understanding to call nsg before up out of bed. Call light within reach, patient able to voice, and demonstrate understanding. Skin, clean-dry- intact without evidence of bruising, or skin tears.  No evidence of skin break down noted on exam.  ?  Will cont to eval and treat per MD orders.  Melonie [place], RN  03/26/2018 7:32 PM

## 2018-03-26 NOTE — ED Notes (Signed)
Attempted to give report to nurse ; nurse not available at this time to take report , was told she would call back when she is done getting report

## 2018-03-27 ENCOUNTER — Ambulatory Visit: Payer: Self-pay | Admitting: *Deleted

## 2018-03-27 LAB — BASIC METABOLIC PANEL
Anion gap: 7 (ref 5–15)
BUN: 25 mg/dL — ABNORMAL HIGH (ref 6–20)
CALCIUM: 8.9 mg/dL (ref 8.9–10.3)
CO2: 21 mmol/L — AB (ref 22–32)
CREATININE: 1.44 mg/dL — AB (ref 0.61–1.24)
Chloride: 114 mmol/L — ABNORMAL HIGH (ref 101–111)
GFR calc Af Amer: 53 mL/min — ABNORMAL LOW (ref >60)
GFR, EST NON AFRICAN AMERICAN: 46 mL/min — AB (ref >60)
Glucose, Bld: 290 mg/dL — ABNORMAL HIGH (ref 65–99)
Potassium: 4.3 mmol/L (ref 3.5–5.1)
Sodium: 142 mmol/L (ref 135–145)

## 2018-03-27 LAB — GLUCOSE, CAPILLARY: Glucose-Capillary: 270 mg/dL — ABNORMAL HIGH (ref 65–99)

## 2018-03-27 MED ORDER — ISOSORBIDE MONONITRATE ER 30 MG PO TB24: 30.0000 mg | ORAL_TABLET | Freq: Every day | ORAL | 3 refills | Status: DC

## 2018-03-27 MED ORDER — AMLODIPINE BESYLATE 10 MG PO TABS: 10.0000 mg | ORAL_TABLET | Freq: Every day | ORAL | 3 refills | Status: DC

## 2018-03-27 MED ORDER — HYDRALAZINE HCL 25 MG PO TABS: 25.0000 mg | ORAL_TABLET | Freq: Three times a day (TID) | ORAL | 3 refills | Status: DC

## 2018-03-27 MED ORDER — LOSARTAN POTASSIUM-HCTZ 50-12.5 MG PO TABS: 1.0000 | ORAL_TABLET | Freq: Every day | ORAL | 5 refills | Status: DC

## 2018-03-27 MED ORDER — INSULIN GLARGINE 100 UNIT/ML SOLOSTAR PEN: 48.0000 [IU] | PEN_INJECTOR | Freq: Every day | SUBCUTANEOUS | 11 refills | Status: DC

## 2018-03-27 NOTE — Consult Note (Addendum)
   [facility] CM Inpatient Consult   03/27/2018  [place] [birth year] [id number]     Patient was active with [facility] prior to hospitalization.  Spoke with Jake Gross prior to hospital discharge about ongoing [place] Management follow up. He remains agreeable to [place] follow up for DM education.  Denies having concerns with obtaining medications or with transportation. Confirms Primary Care MD is Webb Silversmith, NP at [place] at [facility] (this practice is listed as doing transition of care call).  [facility] Care Management consent obtained and [facility] folder provided.   Made inpatient RNCM aware of above notes.  Will update [place].  Jake Gross is slated for discharge today.   Marthenia Rolling, MSN-Ed, RN,BSN [facility] Liaison [phone]

## 2018-03-27 NOTE — Discharge Summary (Signed)
[place], is a 77 y.o. male  DOB April 16, 1941  MRN [id number].  Admission date:  03/25/2018  Admitting Physician  Edwin Dada, MD  Discharge Date:  03/27/2018   Primary MD  Jearld Fenton, NP  Recommendations for primary care physician for things to follow:   1) check your blood sugar at least 4 times a day over the next couple of days and keep a diary, and that you may switch back to 3 times a day after that 2) please eat supper tonight and then have a snack before you go to bed to avoid low blood sugar because you are switching your  glargine/Lantus insulin from daytime to bedtime tonight 3)Your metoprolol and Cardizem has been discontinued due to slow heart rate 4) take amlodipine, hydralazine and isosorbide as prescribed for your blood pressure 5) follow-up with your doctor within a week for blood sugar and blood pressure recheck 6)Avoid ibuprofen/Advil/Aleve/Motrin/Goody Powders/Naproxen/BC powders/Meloxicam/Diclofenac/Indomethacin and other Nonsteroidal anti-inflammatory medications as these will make you more likely to bleed and can cause stomach ulcers, can also cause Kidney problems.  7)Low salt diet advised 8)Avoid sweets including excessive Watermelon intake due to concerns about high blood sugar   Admission Diagnosis  Hyperkalemia [E87.5] Hyperglycemia [R73.9]   Discharge Diagnosis  Hyperkalemia [E87.5] Hyperglycemia [R73.9]    Principal Problem:   Hyperkalemia Active Problems:   Diabetes mellitus type 2, insulin dependent ([place])   Essential hypertension   AKI (acute kidney injury) ([place])      Past Medical History:  Diagnosis Date  . 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 Arthritis   . Cervical spondylosis   . Diabetes mellitus   . Hyperlipidemia   . Hypertension   . Stroke ([place]) 06/19/2017  . Vitamin D  deficiency     Past Surgical History:  Procedure Laterality Date  . ABCESS DRAINAGE Left    buttocks  . CARDIOVASCULAR STRESS TEST  10/12/1999   EF 63%. NO ISCHEMIA  . COLONOSCOPY W/ POLYPECTOMY     5 polyps  . EUS N/A 03/11/2015   Procedure: LOWER ENDOSCOPIC ULTRASOUND (EUS);  Surgeon: Milus Banister, MD;  Location: [facility];  Service: Endoscopy;  Laterality: N/A;  . FLEXIBLE SIGMOIDOSCOPY N/A 02/02/2015   Procedure: FLEXIBLE SIGMOIDOSCOPY;  Surgeon: Inda Castle, MD;  Location: [facility];  Service: Endoscopy;  Laterality: N/A;  ERBE  . LOOP RECORDER INSERTION N/A 08/14/2017   Procedure: LOOP RECORDER INSERTION;  Surgeon: Thompson Grayer, MD;  Location: [facility];  Service: Cardiovascular;  Laterality: N/A;  . PARTIAL PROCTECTOMY BY TEM N/A 04/08/2015   Procedure: TEM PARTIAL PROCTECTOMY OF RECTAL MASS;  Surgeon: Michael Boston, MD;  Location: [facility];  Service: General;  Laterality: N/A;  . TEE WITHOUT CARDIOVERSION N/A 06/25/2017   Procedure: TRANSESOPHAGEAL ECHOCARDIOGRAM (TEE);  Surgeon: Skeet Latch, MD;  Location: [facility];  Service: Cardiovascular;  Laterality: N/A;  . TONSILLECTOMY AND ADENOIDECTOMY     as child  HPI  from the history and physical done on the day of admission:    HPI: Britten S Lean is a 77 y.o. male with a past medical history significant for HTN, IDDM, and CVA last Sep no residual deficits who presents with hyperkalemia.  The patient was in his usual state of health until last week when he was getting routine labs, found to have hyperkalemia, 6.3 mEq/L.  His PCP ordered short-term follow-up today, repeat today was 7.1, as well as worsening renal function from baseline and so he was sent to the ER.  Patient has been asymptomatic, no malaise, nausea, confusion.  No decreased urine output, swelling, dyspnea.  His only out of the ordinary symptom is frequent nocturia for several months.  ED course: -Afebrile 2, respirations  and pulse ox normal, blood pressure 141/80 -Na 133, K 6.6, Cr 1.81 (baseline 1.1), WBC 7.8 K, Hgb 13.2, glucose 513, anion gap normal - Troponin negative - Urinalysis without leukocytes, no casts -ECG showed bradycardia, sinus, no peak T waves, no ST changes, no QRS changes -He was given calcium, insulin, normal saline, and Kayexalate, and TRH were asked to evaluate for hyperkalemia and hyperglycemia    Hospital Course:    Brief Summary 77 y.o.malewith a past medical history significant for HTN, IDDM, and CVA last Sep no residual deficitswho was admitted on 03/25/2018 with AKI andhyperkalemia in the setting of severe hyperglycemia with glucose over 500.  Plan:-  1)AKI----acute kidney injury  --overall renal function is improved with hydration, and with discontinuation of losartan/HCTZ, in retrospect AKI was triggered by dehydration in the setting of severe hyperglycemia with glucose over 500 on admission, compounded by HCTZ/losartan use   creatinine on admission= 1.81  ,   baseline creatinine =1.3 , creatinine is now=1.4  ,  Avoid nephrotoxic agents/dehydration/hypotension, change IV fluids to normal saline at 100 mm an hour  2)Severe Hyperglycemia/Uncontrolled Diabetes-  overall much improved with IV fluids and insulin drip, admission glucose was 513 with a bicarb of 20, patient anion gap  Was (8) normal on admission, glucose is now below 200, anion gap remains normal below 10, and bicarb is up to 22,  patient was transitioned from IV insulin drip to Lantus insulin  3)HyperKalemia-potassium peaked at 7.5, patient received hyperkalemia protocol including calcium chloride, dextrose and insulin as well as Kayexalate, potassium is down to 4.3, okay to restart losartan at a lower dose of 50/12.5,  4)HTN-stage II, BP running somewhat higher due to discontinuation of BP meds, okay to restart losartan/HCTZ at a lower dose of 50/12.5,, continue amlodipine 10 mg daily, add hydralazine 25 mg 3  times daily,  5)H/o CVA-no evidence of new neuro deficits, continue Plavix 75 mg and Crestor 20 mg daily``  6)Asymptomatic Bradycardia-continue to hold Cardizem and Metoprolol due to bradycardia, patient has an loop recorder, bradycardia may have been exacerbated by hyperkalemia, last known EF 55-60  Code Status : Full   Disposition Plan  : home  Discharge Condition: stable   Diet and Activity recommendation:  As advised  Discharge Instructions    Discharge Instructions    Call MD for:  difficulty breathing, headache or visual disturbances   Complete by:  As directed    Call MD for:  persistant dizziness or light-headedness   Complete by:  As directed    Call MD for:  persistant nausea and vomiting   Complete by:  As directed    Call MD for:  temperature >100.4   Complete by:  As directed  Diet - low sodium heart healthy   Complete by:  As directed    Discharge instructions   Complete by:  As directed    1) check your blood sugar at least 4 times a day over the next couple of days and keep a diary, and that you may switch back to 3 times a day after that 2) please eat supper tonight and then have a snack before you go to bed to avoid low blood sugar because you are switching your  glargine/Lantus insulin from daytime to bedtime tonight 3)Your metoprolol and Cardizem has been discontinued due to slow heart rate 4) take amlodipine, hydralazine and isosorbide as prescribed for your blood pressure 5) follow-up with your doctor within a week for blood sugar and blood pressure recheck 6)Avoid ibuprofen/Advil/Aleve/Motrin/Goody Powders/Naproxen/BC powders/Meloxicam/Diclofenac/Indomethacin and other Nonsteroidal anti-inflammatory medications as these will make you more likely to bleed and can cause stomach ulcers, can also cause Kidney problems.  7)Low salt diet advised 8)Avoid sweets including excessive Watermelon intake due to concerns about high blood sugar   Increase  activity slowly   Complete by:  As directed         Discharge Medications     Allergies as of 03/27/2018   No Known Allergies     Medication List    STOP taking these medications   diltiazem 240 MG 24 hr capsule Commonly known as:  CARDIZEM CD   losartan-hydrochlorothiazide 100-25 MG tablet Commonly known as:  HYZAAR Replaced by:  losartan-hydrochlorothiazide 50-12.5 MG tablet   metoprolol succinate 25 MG 24 hr tablet Commonly known as:  TOPROL-XL     TAKE these medications   amLODipine 10 MG tablet Commonly known as:  NORVASC Take 1 tablet (10 mg total) by mouth daily. Start taking on:  03/28/2018   clopidogrel 75 MG tablet Commonly known as:  PLAVIX TAKE 1 TABLET BY MOUTH EVERY DAY   fenofibrate micronized 67 MG capsule Commonly known as:  LOFIBRA Take 1 capsule (67 mg total) by mouth daily before breakfast.   hydrALAZINE 25 MG tablet Commonly known as:  APRESOLINE Take 1 tablet (25 mg total) by mouth 3 (three) times daily. For BP   Insulin Glargine 100 UNIT/ML Solostar Pen Commonly known as:  LANTUS SOLOSTAR Inject 48 Units into the skin at bedtime. What changed:  how much to take   insulin lispro 100 UNIT/ML KiwkPen Commonly known as:  HUMALOG KWIKPEN 3 times a day (just before each meal) 55-25-55 units, and and pen needles 4/day   isosorbide mononitrate 30 MG 24 hr tablet Commonly known as:  IMDUR Take 1 tablet (30 mg total) by mouth daily. For BP   losartan-hydrochlorothiazide 50-12.5 MG tablet Commonly known as:  HYZAAR Take 1 tablet by mouth daily. For BP Replaces:  losartan-hydrochlorothiazide 100-25 MG tablet   ONE TOUCH ULTRA TEST test strip Generic drug:  glucose blood USE 1 STRIP TWICE A DAY   ONETOUCH DELICA LANCETS 87O Misc   rosuvastatin 20 MG tablet Commonly known as:  CRESTOR TAKE 1 TABLET AT BEDTIME What changed:    how much to take  how to take this  when to take this       Major procedures and Radiology Reports -  PLEASE review detailed and final reports for all details, in brief   No results found.  Micro Results    No results found for this or any previous visit (from the past 240 hour(s)).     Today   Subjective  [place] today has no new complaints, no chest pains palpitations no dizziness          Patient has been seen and examined prior to discharge   Objective   Blood pressure (!) 163/75, pulse (!) 59, temperature 97.7 F (36.5 C), temperature source Oral, resp. rate 16, height 5\' 7"  (1.702 m), weight 96.9 kg (213 lb 10 oz), SpO2 98 %.   Intake/Output Summary (Last 24 hours) at 03/27/2018 1005 Last data filed at 03/27/2018 0611 Gross per 24 hour  Intake 2466.66 ml  Output 850 ml  Net 1616.66 ml    Exam Gen:- Awake Alert,  In no apparent distress  HEENT:- [place].AT, No sclera icterus Neck-Supple Neck,No JVD,.  Lungs-  CTAB , good air movement CV- S1, S2 normal, loop recorder Abd-  +ve B.Sounds, Abd Soft, No tenderness,    Extremity/Skin:- No  edema,   good pulses, dry skin Psych-affect is appropriate, oriented x3 Neuro-no new focal deficits, no tremors   Data Review   CBC w Diff:  Lab Results  Component Value Date   WBC 8.2 03/26/2018   HGB 14.0 03/26/2018   HCT 43.5 03/26/2018   PLT 172 03/26/2018   LYMPHOPCT 23 03/25/2018   MONOPCT 10 03/25/2018   EOSPCT 2 03/25/2018   BASOPCT 1 03/25/2018    CMP:  Lab Results  Component Value Date   NA 142 03/27/2018   NA 141 01/19/2016   K 4.3 03/27/2018   CL 114 (H) 03/27/2018   CO2 21 (L) 03/27/2018   BUN 25 (H) 03/27/2018   BUN 34 (A) 01/19/2016   CREATININE 1.44 (H) 03/27/2018   CREATININE 1.14 11/07/2016   GLU 207 01/19/2016   PROT 6.7 03/25/2018   ALBUMIN 3.0 (L) 03/25/2018   BILITOT 0.7 03/25/2018   ALKPHOS 77 03/25/2018   AST 21 03/25/2018   ALT 18 03/25/2018  .   Total Discharge time is about 33 minutes  Roxan Hockey M.D on 03/27/2018 at 10:05 AM  [facility]   Office   [phone]  Voice Recognition Viviann Spare dictation system was used to create this note, attempts have been made to correct errors. Please contact the author with questions and/or clarifications.

## 2018-03-27 NOTE — Discharge Instructions (Signed)
1) check your blood sugar at least 4 times a day over the next couple of days and keep a diary, and that you may switch back to 3 times a day after that 2) please eat supper tonight and then have a snack before you go to bed to avoid low blood sugar because you are switching your  glargine/Lantus insulin from daytime to bedtime tonight 3)Your metoprolol and Cardizem has been discontinued due to slow heart rate 4) take amlodipine, hydralazine and isosorbide as prescribed for your blood pressure 5) follow-up with your doctor within a week for blood sugar and blood pressure recheck 6)Avoid ibuprofen/Advil/Aleve/Motrin/Goody Powders/Naproxen/BC powders/Meloxicam/Diclofenac/Indomethacin and other Nonsteroidal anti-inflammatory medications as these will make you more likely to bleed and can cause stomach ulcers, can also cause Kidney problems.  7)Low salt diet advised 8)Avoid sweets including excessive Watermelon intake due to concerns about high blood sugar

## 2018-03-27 NOTE — Progress Notes (Signed)
Inpatient Diabetes Program Recommendations  AACE/ADA: New Consensus Statement on Inpatient Glycemic Control (2015)  Target Ranges:  Prepandial:   less than 140 mg/dL      Peak postprandial:   less than 180 mg/dL (1-2 hours)      Critically ill patients:  140 - 180 mg/dL   Lab Results  Component Value Date   GLUCAP 270 (H) 03/27/2018   HGBA1C 9.7 01/28/2018    Review of Glycemic ControlResults for [place], Jake Gross" (MRN [id number]) as of 03/27/2018 10:30  Ref. Range 03/26/2018 10:26 03/26/2018 11:41 03/26/2018 16:51 03/26/2018 20:57 03/27/2018 07:39  Glucose-Capillary Latest Ref Range: 65 - 99 mg/dL 218 (H) 230 (H) 307 (H) 286 (H) 270 (H)    Diabetes history: Type 2 DM  Outpatient Diabetes medications:  Lantus 40 units daily, Humalog 55 units AM, 25 units lunch, and 55 units with supper Current orders for Inpatient glycemic control:  Lantus 44 units q HS, Novolog resistant tid with meals and HS, Novolog 4 units tid with meals  Inpatient Diabetes Program Recommendations:    Please increase Novolog meal coverage to 12 units tid with meals (hold if patient eats less than 50%).    Thanks,  Adah Perl, RN, BC-ADM Inpatient Diabetes Coordinator Pager [phone] (8a-5p)

## 2018-03-27 NOTE — Progress Notes (Signed)
[place] to be D/C'd home per MD order. Discussed with the patient and all questions fully answered. VVS, Skin clean, dry and intact without evidence of skin break down, no evidence of skin tears noted.  IV catheter discontinued intact. Site without signs and symptoms of complications. Dressing and pressure applied.  An After Visit Summary and prescriptions were printed and given to the patient. Emphasized the importance of eating a large meal tonight and bedtime snack per order. Verbalized understanding. Patient escorted via [place], and D/C home via private auto. Patient insisted he was able to drive. Melonie Florida  03/27/2018 12:19 PM

## 2018-03-28 ENCOUNTER — Telehealth: Payer: Self-pay | Admitting: *Deleted

## 2018-03-28 ENCOUNTER — Other Ambulatory Visit: Payer: Self-pay | Admitting: Internal Medicine

## 2018-03-28 NOTE — Telephone Encounter (Signed)
Lm requesting return call to complete TCM and confirm hosp f/u appt  

## 2018-03-29 NOTE — Telephone Encounter (Signed)
Lm requesting return call to complete TCM and confirm hosp f/u appt  

## 2018-04-09 ENCOUNTER — Ambulatory Visit: Payer: Medicare Other | Admitting: Endocrinology

## 2018-04-11 ENCOUNTER — Encounter: Payer: Self-pay | Admitting: Endocrinology

## 2018-04-11 ENCOUNTER — Ambulatory Visit (INDEPENDENT_AMBULATORY_CARE_PROVIDER_SITE_OTHER): Payer: Medicare Other | Admitting: Endocrinology

## 2018-04-11 VITALS — BP 146/70 | HR 74 | Ht 67.0 in | Wt 228.6 lb

## 2018-04-11 DIAGNOSIS — E119 Type 2 diabetes mellitus without complications: Secondary | ICD-10-CM

## 2018-04-11 DIAGNOSIS — I6389 Other cerebral infarction: Secondary | ICD-10-CM

## 2018-04-11 DIAGNOSIS — Z794 Long term (current) use of insulin: Secondary | ICD-10-CM

## 2018-04-11 LAB — POCT GLYCOSYLATED HEMOGLOBIN (HGB A1C): HEMOGLOBIN A1C: 10.5 % — AB (ref 4.0–5.6)

## 2018-04-11 MED ORDER — INSULIN GLARGINE 100 UNIT/ML SOLOSTAR PEN: 150.0000 [IU] | PEN_INJECTOR | SUBCUTANEOUS | 11 refills | Status: DC

## 2018-04-11 NOTE — Patient Instructions (Addendum)
Your blood pressure is high today.  Please see your primary care provider soon, to have it rechecked. check your blood sugar twice a day.  vary the time of day when you check, between before the 3 meals, and at bedtime.  also check if you have symptoms of your blood sugar being too high or too low.  please keep a record of the readings and bring it to your next appointment here (or you can bring the meter itself).  You can write it on any piece of paper.  please call us sooner if your blood sugar goes below 70, or if you have a lot of readings over 200. Please increase the lantus to 150 units each morning, and: Stop taking the humalog. Please call or message [place] next week, to tell us how the blood sugar is doing.    Please come back for a follow-up appointment in 2 months.

## 2018-04-11 NOTE — Progress Notes (Signed)
Subjective:    Patient ID: Jake Gross, male    DOB: 10-16-1941, 77 y.o.   MRN: [id number]  HPI Pt returns for f/u of diabetes mellitus:  DM type: Insulin-requiring type 2 DM.   Dx'ed: 0174 Complications: polyneuropathy, retinopathy, CVA, and renal insufficiency.  Therapy: insulin since 2005 DKA: never Severe hypoglycemia: last episode was in 2014.   Pancreatitis: never.  Pancreatic imaging: normal CT in 2006 Other: he eats 2 meals per day; he takes multiple daily injections.  Interval history: Pt was recently in the hospital with nonketotic hyperosmolar hyperglycemic state.  He feels better now.  He brings a record of his cbg's which I have reviewed today.  It varies from 128-170.  There is no trend throughout the day.   Past Medical History:  Diagnosis Date  . 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 Arthritis   . Cervical spondylosis   . Diabetes mellitus   . Hyperlipidemia   . Hypertension   . Stroke ([place]) 06/19/2017  . Vitamin D deficiency     Past Surgical History:  Procedure Laterality Date  . ABCESS DRAINAGE Left    buttocks  . CARDIOVASCULAR STRESS TEST  10/12/1999   EF 63%. NO ISCHEMIA  . COLONOSCOPY W/ POLYPECTOMY     5 polyps  . EUS N/A 03/11/2015   Procedure: LOWER ENDOSCOPIC ULTRASOUND (EUS);  Surgeon: Milus Banister, MD;  Location: [facility];  Service: Endoscopy;  Laterality: N/A;  . FLEXIBLE SIGMOIDOSCOPY N/A 02/02/2015   Procedure: FLEXIBLE SIGMOIDOSCOPY;  Surgeon: Inda Castle, MD;  Location: [facility];  Service: Endoscopy;  Laterality: N/A;  ERBE  . LOOP RECORDER INSERTION N/A 08/14/2017   Procedure: LOOP RECORDER INSERTION;  Surgeon: Thompson Grayer, MD;  Location: [facility];  Service: Cardiovascular;  Laterality: N/A;  . PARTIAL PROCTECTOMY BY TEM N/A 04/08/2015   Procedure: TEM PARTIAL PROCTECTOMY OF RECTAL MASS;  Surgeon: Michael Boston, MD;  Location: [facility];  Service: General;   Laterality: N/A;  . TEE WITHOUT CARDIOVERSION N/A 06/25/2017   Procedure: TRANSESOPHAGEAL ECHOCARDIOGRAM (TEE);  Surgeon: Skeet Latch, MD;  Location: [facility];  Service: Cardiovascular;  Laterality: N/A;  . TONSILLECTOMY AND ADENOIDECTOMY     as child    Social History   Socioeconomic History  . Marital status: Divorced    Spouse name: Not on file  . Number of children: 2  . Years of education: Not on file  . Highest education level: Not on file  Occupational History  . Occupation: retired    Fish farm manager: [employer]  Social Needs  . Financial resource strain: Not on file  . Food insecurity:    Worry: Not on file    Inability: Not on file  . Transportation needs:    Medical: Not on file    Non-medical: Not on file  Tobacco Use  . Smoking status: Former Smoker    Types: Cigarettes    Last attempt to quit: 08/13/1994    Years since quitting: 23.6  . Smokeless tobacco: Never Used  Substance and Sexual Activity  . Alcohol use: No    Alcohol/week: 0.0 oz  . Drug use: No  . Sexual activity: Not Currently  Lifestyle  . Physical activity:    Days per week: Not on file    Minutes per session: Not on file  . Stress: Not on file  Relationships  . Social connections:    Talks on phone: Not on file  Gets together: Not on file    Attends religious service: Not on file    Active member of club or organization: Not on file    Attends meetings of clubs or organizations: Not on file    Relationship status: Not on file  . Intimate partner violence:    Fear of current or ex partner: Not on file    Emotionally abused: Not on file    Physically abused: Not on file    Forced sexual activity: Not on file  Other Topics Concern  . Not on file  Social History Narrative  . Not on file    Current Outpatient Medications on File Prior to Visit  Medication Sig Dispense Refill  . amLODipine (NORVASC) 10 MG tablet Take 1 tablet (10 mg total) by mouth daily. 30 tablet 3  . clopidogrel  (PLAVIX) 75 MG tablet TAKE 1 TABLET BY MOUTH EVERY DAY 30 tablet 10  . fenofibrate micronized (LOFIBRA) 67 MG capsule Take 1 capsule (67 mg total) by mouth daily before breakfast. 90 capsule 1  . hydrALAZINE (APRESOLINE) 25 MG tablet Take 1 tablet (25 mg total) by mouth 3 (three) times daily. For BP 90 tablet 3  . isosorbide mononitrate (IMDUR) 30 MG 24 hr tablet Take 1 tablet (30 mg total) by mouth daily. For BP 30 tablet 3  . losartan-hydrochlorothiazide (HYZAAR) 50-12.5 MG tablet Take 1 tablet by mouth daily. For BP 30 tablet 5  . ONE TOUCH ULTRA TEST test strip USE 1 STRIP TWICE A DAY 200 each 3  . ONETOUCH DELICA LANCETS 41Y MISC     . rosuvastatin (CRESTOR) 20 MG tablet TAKE 1 TABLET AT BEDTIME (Patient taking differently: TAKE 1 TABLET  20mg   AT BEDTIME) 90 tablet 2   No current facility-administered medications on file prior to visit.     No Known Allergies  Family History  Problem Relation Age of Onset  . Cancer Father        "all over"  . Coronary artery disease Brother   . Diabetes Brother   . Stroke Mother   . Diabetes Mother   . Cancer Brother   . Colon cancer Neg Hx   . Esophageal cancer Neg Hx   . Rectal cancer Neg Hx   . Stomach cancer Neg Hx     BP (!) 146/70 (BP Location: Left Arm, Patient Position: Sitting, Cuff Size: Normal)   Pulse 74   Ht 5\' 7"  (1.702 m)   Wt 228 lb 9.6 oz (103.7 kg)   SpO2 97%   BMI 35.80 kg/m   Review of Systems He denies hypoglycemia    Objective:   Physical Exam VITAL SIGNS:  See vs page.   GENERAL: no distress.  Pulses: foot pulses are intact bilaterally.   MSK: no deformity of the feet or ankles.  CV: 2+ bilat edema of the legs.  Skin:  no ulcer on the feet or ankles.  normal temp on the feet and ankle, but there is hyperpigmentation and erythema of the legs.   Neuro: sensation is intact to touch on the feet and ankles.   Lab Results  Component Value Date   CREATININE 1.44 (H) 03/27/2018   BUN 25 (H) 03/27/2018   NA  142 03/27/2018   K 4.3 03/27/2018   CL 114 (H) 03/27/2018   CO2 21 (L) 03/27/2018     Lab Results  Component Value Date   HGBA1C 10.5 (A) 04/11/2018      Assessment & Plan:  Insulin-requiring type 2 DM, with CVA: Worse.  He should try a simpler regimen.   Renal insuff: he may need a faster-acting qd insulin, but we'll continue the same for now.   HTN: is noted today.   Patient Instructions  Your blood pressure is high today.  Please see your primary care provider soon, to have it rechecked. check your blood sugar twice a day.  vary the time of day when you check, between before the 3 meals, and at bedtime.  also check if you have symptoms of your blood sugar being too high or too low.  please keep a record of the readings and bring it to your next appointment here (or you can bring the meter itself).  You can write it on any piece of paper.  please call us sooner if your blood sugar goes below 70, or if you have a lot of readings over 200. Please increase the lantus to 150 units each morning, and: Stop taking the humalog. Please call or message [place] next week, to tell us how the blood sugar is doing.    Please come back for a follow-up appointment in 2 months.

## 2018-04-23 ENCOUNTER — Ambulatory Visit: Payer: Self-pay

## 2018-04-23 NOTE — Telephone Encounter (Signed)
rec'd phone call from pt. And Health Coach from [employer].  Pt. Reported he is having swelling of bilateral feet up to calves; equal in size; denied redness, warmth, or pain.  Denied swelling of upper extremities or upper body.  Stated has no shortness of breath at this time;  denied chest pain.  Reported he experienced some shortness of breath last week, while mowing an embankment in his yard, that is a 45 degree slope. Stated the swelling of lower legs/ feet developed after his discharge; verb. that it may be caused by being placed back on blood pressure medication, that Webb Silversmith had taken him off of.   Called [facility] to get approval to schedule appt. Tomorrow with R. Baity.  Was advised to schedule at 4:00 PM 7/10, in 30 min. Appt. As a hosp. F/u.  Pt. Advised of appt.  Care advice given per protocol.  Verb. Understanding.  Agrees with plan.         Reason for Disposition . [1] MODERATE leg swelling (e.g., swelling extends up to knees) AND [2] new onset or worsening  Answer Assessment - Initial Assessment Questions 1. ONSET: "When did the swelling start?" (e.g., minutes, hours, days)     Last week   2. LOCATION: "What part of the leg is swollen?"  "Are both legs swollen or just one leg?"    Bilateral feet up to calf  3. SEVERITY: "How bad is the swelling?" (e.g., localized; mild, moderate, severe)  - Localized - small area of swelling localized to one leg  - MILD pedal edema - swelling limited to foot and ankle, pitting edema < 1/4 inch (6 mm) deep, rest and elevation eliminate most or all swelling  - MODERATE edema - swelling of lower leg to knee, pitting edema > 1/4 inch (6 mm) deep, rest and elevation only partially reduce swelling  - SEVERE edema - swelling extends above knee, facial or hand swelling present     Moderate 4. REDNESS: "Does the swelling look red or infected?"     Denied redness or warmth 5. PAIN: "Is the swelling painful to touch?" If so, ask: "How painful is  it?"   (Scale 1-10; mild, moderate or severe)    Denied pain 6. FEVER: "Do you have a fever?" If so, ask: "What is it, how was it measured, and when did it start?"      Denied fever 7. CAUSE: "What do you think is causing the leg swelling?"     Possibly change in medication 8. MEDICAL HISTORY: "Do you have a history of heart failure, kidney disease, liver failure, or cancer?"     Diabetic, hypertension, denied any heart issues; hx of stroke in October 9. RECURRENT SYMPTOM: "Have you had leg swelling before?" If so, ask: "When was the last time?" "What happened that time?"     Yes; had medication adjustment  10. OTHER SYMPTOMS: "Do you have any other symptoms?" (e.g., chest pain, difficulty breathing)       Denied chest pain ; reported some shortness of breath with mowing of 45 degree bank in his yard; denied further SOB.  Protocols used: LEG SWELLING AND EDEMA-A-AH

## 2018-04-24 ENCOUNTER — Encounter: Payer: Self-pay | Admitting: Internal Medicine

## 2018-04-24 ENCOUNTER — Ambulatory Visit (INDEPENDENT_AMBULATORY_CARE_PROVIDER_SITE_OTHER): Payer: Medicare Other | Admitting: Internal Medicine

## 2018-04-24 VITALS — BP 140/70 | HR 63 | Temp 98.0°F | Wt 228.0 lb

## 2018-04-24 DIAGNOSIS — Z794 Long term (current) use of insulin: Secondary | ICD-10-CM

## 2018-04-24 DIAGNOSIS — I6389 Other cerebral infarction: Secondary | ICD-10-CM

## 2018-04-24 DIAGNOSIS — E1165 Type 2 diabetes mellitus with hyperglycemia: Secondary | ICD-10-CM | POA: Diagnosis not present

## 2018-04-24 DIAGNOSIS — E875 Hyperkalemia: Secondary | ICD-10-CM | POA: Diagnosis not present

## 2018-04-24 DIAGNOSIS — I10 Essential (primary) hypertension: Secondary | ICD-10-CM | POA: Diagnosis not present

## 2018-04-24 DIAGNOSIS — N179 Acute kidney failure, unspecified: Secondary | ICD-10-CM | POA: Diagnosis not present

## 2018-04-24 MED ORDER — DILTIAZEM HCL ER COATED BEADS 240 MG PO TB24: 240.0000 mg | ORAL_TABLET | Freq: Every day | ORAL | 1 refills | Status: DC

## 2018-04-24 MED ORDER — LISINOPRIL-HYDROCHLOROTHIAZIDE 20-25 MG PO TABS: 1.0000 | ORAL_TABLET | Freq: Every day | ORAL | 2 refills | Status: DC

## 2018-04-24 MED ORDER — METOPROLOL SUCCINATE ER 25 MG PO TB24: 25.0000 mg | ORAL_TABLET | Freq: Every day | ORAL | 3 refills | Status: DC

## 2018-04-24 NOTE — Patient Instructions (Signed)

## 2018-04-24 NOTE — Progress Notes (Signed)
Subjective:    Patient ID: [employer], male    DOB: [birth year], 77 y.o.   MRN: [id number]  HPI  Pt presents to the clinic today for hospital follow up. He went to the ER 6/10 sent by PCP for hyperkalemia, potassium of 7.1. In the ER, he was noted to have a glucose of 513, creatinine of 1.81. He was treated with IVF, Calcium, Insuline, Kayexalate, and was admitted for observation. His kidney function improved, they held his Losartan HCTZ, which was resumed upon discharge at a lower dose of 50-12.5. He was continued on his Amlodipine and Hydralazine was added. His Metoprolol and Cardizem was held due to bradycardia. He was started on Lantus for blood sugar control. He is currently taking 150 units and sugars range 61-177. He follows with Dr. Loanne Drilling. Potassium was 4.3 at discharge. He was discharged 6/12. Since discharge, he reports increase in lower extremity edema. His BP today is 140/70. He has not seen his cardiologist.  Review of Systems      Past Medical History:  Diagnosis Date  . 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 Arthritis   . Cervical spondylosis   . Diabetes mellitus   . Hyperlipidemia   . Hypertension   . Stroke ([place]) 06/19/2017  . Vitamin D deficiency     Current Outpatient Medications  Medication Sig Dispense Refill  . amLODipine (NORVASC) 10 MG tablet Take 1 tablet (10 mg total) by mouth daily. 30 tablet 3  . clopidogrel (PLAVIX) 75 MG tablet TAKE 1 TABLET BY MOUTH EVERY DAY 30 tablet 10  . fenofibrate micronized (LOFIBRA) 67 MG capsule Take 1 capsule (67 mg total) by mouth daily before breakfast. 90 capsule 1  . hydrALAZINE (APRESOLINE) 25 MG tablet Take 1 tablet (25 mg total) by mouth 3 (three) times daily. For BP 90 tablet 3  . Insulin Glargine (LANTUS SOLOSTAR) 100 UNIT/ML Solostar Pen Inject 150 Units into the skin every morning. 20 pen 11  . isosorbide mononitrate (IMDUR) 30 MG 24 hr tablet Take 1  tablet (30 mg total) by mouth daily. For BP 30 tablet 3  . losartan-hydrochlorothiazide (HYZAAR) 50-12.5 MG tablet Take 1 tablet by mouth daily. For BP 30 tablet 5  . ONE TOUCH ULTRA TEST test strip USE 1 STRIP TWICE A DAY 200 each 3  . ONETOUCH DELICA LANCETS 79K MISC     . rosuvastatin (CRESTOR) 20 MG tablet TAKE 1 TABLET AT BEDTIME (Patient taking differently: TAKE 1 TABLET  20mg   AT BEDTIME) 90 tablet 2   No current facility-administered medications for this visit.     No Known Allergies  Family History  Problem Relation Age of Onset  . Cancer Father        "all over"  . Coronary artery disease Brother   . Diabetes Brother   . Stroke Mother   . Diabetes Mother   . Cancer Brother   . Colon cancer Neg Hx   . Esophageal cancer Neg Hx   . Rectal cancer Neg Hx   . Stomach cancer Neg Hx     Social History   Socioeconomic History  . Marital status: Divorced    Spouse name: Not on file  . Number of children: 2  . Years of education: Not on file  . Highest education level: Not on file  Occupational History  . Occupation: retired    Fish farm manager: [employer]  Social Needs  . Financial resource strain:  Not on file  . Food insecurity:    Worry: Not on file    Inability: Not on file  . Transportation needs:    Medical: Not on file    Non-medical: Not on file  Tobacco Use  . Smoking status: Former Smoker    Types: Cigarettes    Last attempt to quit: 08/13/1994    Years since quitting: 23.7  . Smokeless tobacco: Never Used  Substance and Sexual Activity  . Alcohol use: No    Alcohol/week: 0.0 oz  . Drug use: No  . Sexual activity: Not Currently  Lifestyle  . Physical activity:    Days per week: Not on file    Minutes per session: Not on file  . Stress: Not on file  Relationships  . Social connections:    Talks on phone: Not on file    Gets together: Not on file    Attends religious service: Not on file    Active member of club or organization: Not on file    Attends  meetings of clubs or organizations: Not on file    Relationship status: Not on file  . Intimate partner violence:    Fear of current or ex partner: Not on file    Emotionally abused: Not on file    Physically abused: Not on file    Forced sexual activity: Not on file  Other Topics Concern  . Not on file  Social History Narrative  . Not on file     Constitutional: Denies fever, malaise, fatigue, headache or abrupt weight changes.  Respiratory: Pt reports 1 episode of shortness of breath. Denies difficulty breathing, cough or sputum production.   Cardiovascular: Pt reports swelling in legs. Denies chest pain, chest tightness, palpitations or swelling in the hands.  Skin: Denies redness, rashes, lesions or ulcercations.  Neurological: Denies dizziness, difficulty with memory, difficulty with speech or problems with balance and coordination.    No other specific complaints in a complete review of systems (except as listed in HPI above).  Objective:   Physical Exam   BP 140/70   Pulse 63   Temp 98 F (36.7 C) (Oral)   Wt 228 lb (103.4 kg)   SpO2 96%   BMI 35.71 kg/m  Wt Readings from Last 3 Encounters:  04/24/18 228 lb (103.4 kg)  04/11/18 228 lb 9.6 oz (103.7 kg)  03/26/18 213 lb 10 oz (96.9 kg)    General: Appears his stated age, obese in NAD. Skin: Warm, dry and intact. No ulcerations noted. Cardiovascular: Normal rate and rhythm. S1,S2 noted.  No murmur, rubs or gallops noted. 2+ pitting BLE edema.  Pulmonary/Chest: Normal effort and positive vesicular breath sounds. No respiratory distress. No wheezes, rales or ronchi noted.  Neurological: Alert and oriented. Sensation intact to BLE.   BMET    Component Value Date/Time   NA 142 03/27/2018 0644   NA 141 01/19/2016   K 4.3 03/27/2018 0644   CL 114 (H) 03/27/2018 0644   CO2 21 (L) 03/27/2018 0644   GLUCOSE 290 (H) 03/27/2018 0644   BUN 25 (H) 03/27/2018 0644   BUN 34 (A) 01/19/2016   CREATININE 1.44 (H)  03/27/2018 0644   CREATININE 1.14 11/07/2016 1514   CALCIUM 8.9 03/27/2018 0644   GFRNONAA 46 (L) 03/27/2018 0644   GFRAA 53 (L) 03/27/2018 0644    Lipid Panel     Component Value Date/Time   CHOL 120 03/22/2018 0925   TRIG 382.0 (H) 03/22/2018 1610  HDL 26.20 (L) 03/22/2018 0925   CHOLHDL 5 03/22/2018 0925   VLDL 76.4 (H) 03/22/2018 0925   LDLCALC 56 06/22/2017 0516    CBC    Component Value Date/Time   WBC 8.2 03/26/2018 0419   RBC 5.11 03/26/2018 0419   HGB 14.0 03/26/2018 0419   HCT 43.5 03/26/2018 0419   PLT 172 03/26/2018 0419   MCV 85.1 03/26/2018 0419   MCH 27.4 03/26/2018 0419   MCHC 32.2 03/26/2018 0419   RDW 13.2 03/26/2018 0419   LYMPHSABS 1.8 03/25/2018 1422   MONOABS 0.8 03/25/2018 1422   EOSABS 0.2 03/25/2018 1422   BASOSABS 0.1 03/25/2018 1422    Hgb A1C Lab Results  Component Value Date   HGBA1C 10.5 (A) 04/11/2018           Assessment & Plan:   Hospital Follow Up for Hyperkalemia, HTN, AKI, Hyperglycemia:  Hospital notes, labs and imaging reviewed Stop Amlodipine and Hydralazine Go back on Metoprolol, Cardizem Change Losartan HCT to Lisinopril HCT Continue Lantus, monitor sugars, follow up with Dr. Loanne Drilling He already has an appt with cardiology BMET today  RTC in 2 weeks to follow up BP/ swelling if this persists Webb Silversmith, NP

## 2018-04-25 LAB — BASIC METABOLIC PANEL
BUN: 34 mg/dL — AB (ref 6–23)
CALCIUM: 9 mg/dL (ref 8.4–10.5)
CO2: 23 mEq/L (ref 19–32)
Chloride: 111 mEq/L (ref 96–112)
Creatinine, Ser: 1.5 mg/dL (ref 0.40–1.50)
GFR: 48.45 mL/min — AB (ref >60.00)
GLUCOSE: 123 mg/dL — AB (ref 70–99)
Potassium: 5.5 mEq/L — ABNORMAL HIGH (ref 3.5–5.1)
Sodium: 143 mEq/L (ref 135–145)

## 2018-04-25 NOTE — Addendum Note (Signed)
Addended by: Lurlean Nanny on: 04/25/2018 12:38 PM   Modules accepted: Orders

## 2018-04-26 ENCOUNTER — Telehealth: Payer: Self-pay | Admitting: Endocrinology

## 2018-04-26 ENCOUNTER — Other Ambulatory Visit: Payer: Self-pay | Admitting: Internal Medicine

## 2018-04-26 ENCOUNTER — Ambulatory Visit (INDEPENDENT_AMBULATORY_CARE_PROVIDER_SITE_OTHER): Payer: Medicare Other | Admitting: *Deleted

## 2018-04-26 ENCOUNTER — Other Ambulatory Visit (INDEPENDENT_AMBULATORY_CARE_PROVIDER_SITE_OTHER): Payer: Medicare Other

## 2018-04-26 DIAGNOSIS — E875 Hyperkalemia: Secondary | ICD-10-CM

## 2018-04-26 DIAGNOSIS — I639 Cerebral infarction, unspecified: Secondary | ICD-10-CM

## 2018-04-26 LAB — POTASSIUM: POTASSIUM: 5.5 meq/L — AB (ref 3.5–5.1)

## 2018-04-26 MED ORDER — FUROSEMIDE 20 MG PO TABS: 20.0000 mg | ORAL_TABLET | Freq: Every day | ORAL | 0 refills | Status: DC

## 2018-04-26 NOTE — Telephone Encounter (Signed)
Called patient to get readings 61 am fasting 62 am fasting 67 am fasting 71 am fasting 78 am fasting 92 am fasting 94 am fasting 102 pm 177 pm 71 am fasting this morning  Patient feels his readings in the am are low now because he was changed to 150 units of Lantus in the morning at last office visit- please advise in Dr. Cordelia Pen absence

## 2018-04-26 NOTE — Telephone Encounter (Signed)
Please call patient at ph# [phone]. He has requested numbers he needs to call in.

## 2018-04-26 NOTE — Progress Notes (Signed)
asix

## 2018-04-26 NOTE — Telephone Encounter (Signed)
Let us reduce his Lantus from 150 to 120 units a day and please have him call us after the weekend to see how his sugars are doing.

## 2018-04-29 NOTE — Progress Notes (Signed)
Carelink Summary Report / Loop Recorder 

## 2018-04-30 ENCOUNTER — Telehealth: Payer: Self-pay | Admitting: Endocrinology

## 2018-04-30 LAB — CUP PACEART REMOTE DEVICE CHECK
Implantable Pulse Generator Implant Date: 20181030
MDC IDC SESS DTM: 20190609184028

## 2018-04-30 NOTE — Telephone Encounter (Signed)
Pt is aware will call back on Friday

## 2018-04-30 NOTE — Telephone Encounter (Signed)
LMTCB

## 2018-04-30 NOTE — Telephone Encounter (Signed)
Pt returning call as instructed

## 2018-04-30 NOTE — Telephone Encounter (Signed)
Patient returning call from office.

## 2018-05-01 ENCOUNTER — Telehealth: Payer: Self-pay | Admitting: Emergency Medicine

## 2018-05-01 NOTE — Telephone Encounter (Signed)
Shan asked that you return her call back about patient diabetic supplies. A form was faxed over yesterday and she needs to know how many test strips are needed. Thanks.

## 2018-05-01 NOTE — Telephone Encounter (Signed)
I spoke with Jake Gross & they are faxing back over correct rx that needs to be signed by physician.

## 2018-05-02 ENCOUNTER — Other Ambulatory Visit: Payer: Self-pay | Admitting: Internal Medicine

## 2018-05-02 NOTE — Addendum Note (Signed)
Addended by: Lurlean Nanny on: 05/02/2018 04:55 PM   Modules accepted: Orders

## 2018-05-03 ENCOUNTER — Other Ambulatory Visit (INDEPENDENT_AMBULATORY_CARE_PROVIDER_SITE_OTHER): Payer: Medicare Other

## 2018-05-03 DIAGNOSIS — E875 Hyperkalemia: Secondary | ICD-10-CM | POA: Diagnosis not present

## 2018-05-03 NOTE — Addendum Note (Signed)
Addended by: Ellamae Sia on: 05/03/2018 11:48 AM   Modules accepted: Orders

## 2018-05-04 ENCOUNTER — Other Ambulatory Visit: Payer: Self-pay | Admitting: Internal Medicine

## 2018-05-04 DIAGNOSIS — E875 Hyperkalemia: Secondary | ICD-10-CM

## 2018-05-04 LAB — POTASSIUM: POTASSIUM: 5.9 mmol/L — AB (ref 3.5–5.3)

## 2018-05-04 MED ORDER — SODIUM POLYSTYRENE SULFONATE PO POWD: Freq: Once | ORAL | 0 refills | Status: AC

## 2018-05-05 ENCOUNTER — Other Ambulatory Visit: Payer: Self-pay | Admitting: Internal Medicine

## 2018-05-06 ENCOUNTER — Other Ambulatory Visit: Payer: Self-pay

## 2018-05-06 DIAGNOSIS — E875 Hyperkalemia: Secondary | ICD-10-CM

## 2018-05-07 ENCOUNTER — Other Ambulatory Visit (INDEPENDENT_AMBULATORY_CARE_PROVIDER_SITE_OTHER): Payer: Medicare Other

## 2018-05-07 ENCOUNTER — Other Ambulatory Visit: Payer: Self-pay | Admitting: Internal Medicine

## 2018-05-07 DIAGNOSIS — E875 Hyperkalemia: Secondary | ICD-10-CM

## 2018-05-07 LAB — POTASSIUM: POTASSIUM: 5.2 meq/L — AB (ref 3.5–5.1)

## 2018-05-08 ENCOUNTER — Other Ambulatory Visit: Payer: Self-pay | Admitting: Internal Medicine

## 2018-05-08 DIAGNOSIS — I10 Essential (primary) hypertension: Secondary | ICD-10-CM

## 2018-05-10 NOTE — Addendum Note (Signed)
Addended by: Lurlean Nanny on: 05/10/2018 10:29 AM   Modules accepted: Orders

## 2018-05-10 NOTE — Telephone Encounter (Signed)
Left detailed msg on VM per HIPAA asking pt if he has changed pharmacies

## 2018-05-10 NOTE — Telephone Encounter (Signed)
Patient returning call to [facility] States that he uses the [id number] [place], [place] - [address]

## 2018-05-28 ENCOUNTER — Other Ambulatory Visit: Payer: Self-pay | Admitting: Internal Medicine

## 2018-05-29 ENCOUNTER — Ambulatory Visit (INDEPENDENT_AMBULATORY_CARE_PROVIDER_SITE_OTHER): Payer: Medicare Other | Admitting: *Deleted

## 2018-05-29 DIAGNOSIS — I639 Cerebral infarction, unspecified: Secondary | ICD-10-CM | POA: Diagnosis not present

## 2018-05-30 NOTE — Progress Notes (Signed)
Carelink Summary Report / Loop Recorder 

## 2018-06-06 LAB — CUP PACEART REMOTE DEVICE CHECK
Date Time Interrogation Session: 20190712183952
MDC IDC PG IMPLANT DT: 20181030

## 2018-06-12 ENCOUNTER — Ambulatory Visit (INDEPENDENT_AMBULATORY_CARE_PROVIDER_SITE_OTHER): Payer: Medicare Other | Admitting: Endocrinology

## 2018-06-12 ENCOUNTER — Encounter: Payer: Self-pay | Admitting: Endocrinology

## 2018-06-12 VITALS — BP 150/78 | HR 51 | Ht 67.0 in | Wt 221.2 lb

## 2018-06-12 DIAGNOSIS — E1165 Type 2 diabetes mellitus with hyperglycemia: Secondary | ICD-10-CM

## 2018-06-12 DIAGNOSIS — Z794 Long term (current) use of insulin: Secondary | ICD-10-CM

## 2018-06-12 DIAGNOSIS — I6389 Other cerebral infarction: Secondary | ICD-10-CM

## 2018-06-12 LAB — POCT GLYCOSYLATED HEMOGLOBIN (HGB A1C): HEMOGLOBIN A1C: 7.1 % — AB (ref 4.0–5.6)

## 2018-06-12 MED ORDER — INSULIN GLARGINE 100 UNIT/ML SOLOSTAR PEN
110.0000 [IU] | PEN_INJECTOR | SUBCUTANEOUS | 11 refills | Status: DC
Start: 2018-06-12 — End: 2018-10-03

## 2018-06-12 NOTE — Patient Instructions (Addendum)
Your blood pressure is high, and heart rate is slow today.  Please see your primary care provider soon, to have these rechecked. check your blood sugar twice a day.  vary the time of day when you check, between before the 3 meals, and at bedtime.  also check if you have symptoms of your blood sugar being too high or too low.  please keep a record of the readings and bring it to your next appointment here (or you can bring the meter itself).  You can write it on any piece of paper.  please call us sooner if your blood sugar goes below 70, or if you have a lot of readings over 200. Please reduce the lantus to 110 units each morning.   Please come back for a follow-up appointment in 3-4 months.

## 2018-06-12 NOTE — Progress Notes (Signed)
Subjective:    Patient ID: [employer], male    DOB: August 16, 1941, 77 y.o.   MRN: [id number]  HPI Pt returns for f/u of diabetes mellitus:  DM type: Insulin-requiring type 2 DM.   Dx'ed: 7622 Complications: polyneuropathy, retinopathy, CVA, and renal insufficiency.  Therapy: insulin since 2005 DKA: never Severe hypoglycemia: last episode was in 2014.   Pancreatitis: never.  Pancreatic imaging: normal CT in 2006 Other: he eats 2 meals per day; he takes multiple daily injections.  Interval history:   He brings a record of his cbg's which I have reviewed today.  It varies from 68-173.  There is no trend throughout the day.  He takes 120 units qam.   Past Medical History:  Diagnosis Date  . 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 Arthritis   . Cervical spondylosis   . Diabetes mellitus   . Hyperlipidemia   . Hypertension   . Stroke ([place]) 06/19/2017  . Vitamin D deficiency     Past Surgical History:  Procedure Laterality Date  . ABCESS DRAINAGE Left    buttocks  . CARDIOVASCULAR STRESS TEST  10/12/1999   EF 63%. NO ISCHEMIA  . COLONOSCOPY W/ POLYPECTOMY     5 polyps  . EUS N/A 03/11/2015   Procedure: LOWER ENDOSCOPIC ULTRASOUND (EUS);  Surgeon: Milus Banister, MD;  Location: [facility];  Service: Endoscopy;  Laterality: N/A;  . FLEXIBLE SIGMOIDOSCOPY N/A 02/02/2015   Procedure: FLEXIBLE SIGMOIDOSCOPY;  Surgeon: Inda Castle, MD;  Location: [facility];  Service: Endoscopy;  Laterality: N/A;  ERBE  . LOOP RECORDER INSERTION N/A 08/14/2017   Procedure: LOOP RECORDER INSERTION;  Surgeon: Thompson Grayer, MD;  Location: [facility];  Service: Cardiovascular;  Laterality: N/A;  . PARTIAL PROCTECTOMY BY TEM N/A 04/08/2015   Procedure: TEM PARTIAL PROCTECTOMY OF RECTAL MASS;  Surgeon: Michael Boston, MD;  Location: [facility];  Service: General;  Laterality: N/A;  . TEE WITHOUT CARDIOVERSION N/A 06/25/2017   Procedure:  TRANSESOPHAGEAL ECHOCARDIOGRAM (TEE);  Surgeon: Skeet Latch, MD;  Location: [facility];  Service: Cardiovascular;  Laterality: N/A;  . TONSILLECTOMY AND ADENOIDECTOMY     as child    Social History   Socioeconomic History  . Marital status: Divorced    Spouse name: Not on file  . Number of children: 2  . Years of education: Not on file  . Highest education level: Not on file  Occupational History  . Occupation: retired    Fish farm manager: [employer]  Social Needs  . Financial resource strain: Not on file  . Food insecurity:    Worry: Not on file    Inability: Not on file  . Transportation needs:    Medical: Not on file    Non-medical: Not on file  Tobacco Use  . Smoking status: Former Smoker    Types: Cigarettes    Last attempt to quit: 08/13/1994    Years since quitting: 23.8  . Smokeless tobacco: Never Used  Substance and Sexual Activity  . Alcohol use: No    Alcohol/week: 0.0 standard drinks  . Drug use: No  . Sexual activity: Not Currently  Lifestyle  . Physical activity:    Days per week: Not on file    Minutes per session: Not on file  . Stress: Not on file  Relationships  . Social connections:    Talks on phone: Not on file    Gets together: Not on file  Attends religious service: Not on file    Active member of club or organization: Not on file    Attends meetings of clubs or organizations: Not on file    Relationship status: Not on file  . Intimate partner violence:    Fear of current or ex partner: Not on file    Emotionally abused: Not on file    Physically abused: Not on file    Forced sexual activity: Not on file  Other Topics Concern  . Not on file  Social History Narrative  . Not on file    Current Outpatient Medications on File Prior to Visit  Medication Sig Dispense Refill  . CARTIA XT 240 MG 24 hr capsule TAKE 1 CAPSULE DAILY 90 capsule 0  . clopidogrel (PLAVIX) 75 MG tablet TAKE 1 TABLET BY MOUTH EVERY DAY 30 tablet 10  . diltiazem  (CARDIZEM LA) 240 MG 24 hr tablet Take 1 tablet (240 mg total) by mouth daily. 90 tablet 1  . fenofibrate micronized (LOFIBRA) 67 MG capsule TAKE 1 CAPSULE DAILY BEFORE BREAKFAST 90 capsule 1  . furosemide (LASIX) 20 MG tablet Take 1 tablet (20 mg total) by mouth daily. 3 tablet 0  . isosorbide mononitrate (IMDUR) 30 MG 24 hr tablet Take 1 tablet (30 mg total) by mouth daily. For BP 30 tablet 3  . lisinopril-hydrochlorothiazide (PRINZIDE,ZESTORETIC) 20-25 MG tablet Take 1 tablet by mouth daily. 30 tablet 2  . metoprolol succinate (TOPROL-XL) 25 MG 24 hr tablet Take 1 tablet (25 mg total) by mouth daily. 90 tablet 3  . ONE TOUCH ULTRA TEST test strip USE 1 STRIP TWICE A DAY 200 each 3  . ONETOUCH DELICA LANCETS 65Y MISC     . rosuvastatin (CRESTOR) 20 MG tablet TAKE 1 TABLET AT BEDTIME (Patient taking differently: TAKE 1 TABLET  20mg   AT BEDTIME) 90 tablet 2   No current facility-administered medications on file prior to visit.     No Known Allergies  Family History  Problem Relation Age of Onset  . Cancer Father        "all over"  . Coronary artery disease Brother   . Diabetes Brother   . Stroke Mother   . Diabetes Mother   . Cancer Brother   . Colon cancer Neg Hx   . Esophageal cancer Neg Hx   . Rectal cancer Neg Hx   . Stomach cancer Neg Hx     BP (!) 150/78 (BP Location: Right Arm, Patient Position: Sitting, Cuff Size: Normal)   Pulse (!) 51   Ht 5\' 7"  (1.702 m)   Wt 221 lb 3.2 oz (100.3 kg)   SpO2 97%   BMI 34.64 kg/m    Review of Systems Denies LOC.  He has lost a few lbs.      Objective:   Physical Exam VITAL SIGNS:  See vs page.   GENERAL: no distress.  Pulses: foot pulses are intact bilaterally.   MSK: no deformity of the feet or ankles.  CV: 2+ bilat edema of the legs.  Skin:  no ulcer on the feet or ankles.  normal temp on the feet and ankle, but there is spotty hyperpigmentation and erythema of the legs.   Neuro: sensation is intact to touch on the feet  and ankles, but decreased from normal Ext: There is bilateral onychomycosis of the toenails.   Lab Results  Component Value Date   CREATININE 1.50 04/24/2018   BUN 34 (H) 04/24/2018   NA 143  04/24/2018   K 5.2 (H) 05/07/2018   CL 111 04/24/2018   CO2 23 04/24/2018    Lab Results  Component Value Date   HGBA1C 7.1 (A) 06/12/2018       Assessment & Plan:  Insulin-requiring type 2 DM: overcontrolled.  Hypoglycemia: this limits aggressiveness of glycemic control.  HTN: is noted today Bradycardia: is noted today  Patient Instructions  Your blood pressure is high, and heart rate is slow today.  Please see your primary care provider soon, to have these rechecked. check your blood sugar twice a day.  vary the time of day when you check, between before the 3 meals, and at bedtime.  also check if you have symptoms of your blood sugar being too high or too low.  please keep a record of the readings and bring it to your next appointment here (or you can bring the meter itself).  You can write it on any piece of paper.  please call us sooner if your blood sugar goes below 70, or if you have a lot of readings over 200. Please reduce the lantus to 110 units each morning.   Please come back for a follow-up appointment in 3-4 months.

## 2018-07-01 ENCOUNTER — Ambulatory Visit (INDEPENDENT_AMBULATORY_CARE_PROVIDER_SITE_OTHER): Payer: Medicare Other | Admitting: *Deleted

## 2018-07-01 DIAGNOSIS — I639 Cerebral infarction, unspecified: Secondary | ICD-10-CM | POA: Diagnosis not present

## 2018-07-02 NOTE — Progress Notes (Signed)
Carelink Summary Report / Loop Recorder 

## 2018-07-04 LAB — CUP PACEART REMOTE DEVICE CHECK
Date Time Interrogation Session: 20190814183909
Implantable Pulse Generator Implant Date: 20181030

## 2018-07-14 LAB — CUP PACEART REMOTE DEVICE CHECK
Implantable Pulse Generator Implant Date: 20181030
MDC IDC SESS DTM: 20190916221051

## 2018-07-16 ENCOUNTER — Other Ambulatory Visit: Payer: Self-pay | Admitting: *Deleted

## 2018-07-17 NOTE — Patient Outreach (Signed)
[facility]) Care Management  07/17/2018   [place] 01/03/1941 [id number]  RN Health Coach telephone call to patient.  [employer] compliance verified. Per patient he was sorry he had not called me back. Per patient he has been traveling all over the country seeing friends. Patient stated his A1C is 7.1. It has come down from 10.5. Per patient he mostly eats salads. He weighs 3 x week. Patient fasting blood sugar was 123. Patient has not started exercising. Patient has agreed to follow up outreach call.  Current Medications:  Current Outpatient Medications  Medication Sig Dispense Refill  . CARTIA XT 240 MG 24 hr capsule TAKE 1 CAPSULE DAILY 90 capsule 0  . clopidogrel (PLAVIX) 75 MG tablet TAKE 1 TABLET BY MOUTH EVERY DAY 30 tablet 10  . diltiazem (CARDIZEM LA) 240 MG 24 hr tablet Take 1 tablet (240 mg total) by mouth daily. 90 tablet 1  . fenofibrate micronized (LOFIBRA) 67 MG capsule TAKE 1 CAPSULE DAILY BEFORE BREAKFAST 90 capsule 1  . furosemide (LASIX) 20 MG tablet Take 1 tablet (20 mg total) by mouth daily. 3 tablet 0  . Insulin Glargine (LANTUS SOLOSTAR) 100 UNIT/ML Solostar Pen Inject 110 Units into the skin every morning. 45 pen 11  . isosorbide mononitrate (IMDUR) 30 MG 24 hr tablet Take 1 tablet (30 mg total) by mouth daily. For BP 30 tablet 3  . lisinopril-hydrochlorothiazide (PRINZIDE,ZESTORETIC) 20-25 MG tablet Take 1 tablet by mouth daily. 30 tablet 2  . metoprolol succinate (TOPROL-XL) 25 MG 24 hr tablet Take 1 tablet (25 mg total) by mouth daily. 90 tablet 3  . ONE TOUCH ULTRA TEST test strip USE 1 STRIP TWICE A DAY 200 each 3  . ONETOUCH DELICA LANCETS 03E MISC     . rosuvastatin (CRESTOR) 20 MG tablet TAKE 1 TABLET AT BEDTIME (Patient taking differently: TAKE 1 TABLET  20mg   AT BEDTIME) 90 tablet 2   No current facility-administered medications for this visit.     Functional Status:  In your present state of health, do you have any difficulty  performing the following activities: 07/16/2018 03/26/2018  Hearing? N N  Vision? N N  Difficulty concentrating or making decisions? N N  Walking or climbing stairs? N Y  Dressing or bathing? N N  Doing errands, shopping? N N  Preparing Food and eating ? N -  Using the Toilet? N -  In the past six months, have you accidently leaked urine? Y -  Do you have problems with loss of bowel control? N -  Managing your Medications? N -  Managing your Finances? N -  Housekeeping or managing your Housekeeping? N -  Some recent data might be hidden    Fall/Depression Screening: Fall Risk  07/16/2018 12/19/2017 12/05/2017  Falls in the past year? Yes Yes No  Number falls in past yr: 1 1 -  Injury with Fall? No No -  Risk for fall due to : History of fall(s);Impaired balance/gait;Impaired mobility History of fall(s);Impaired balance/gait;Impaired mobility -  Follow up Falls evaluation completed;Falls prevention discussed;Education provided Falls evaluation completed;Education provided;Falls prevention discussed -   PHQ 2/9 Scores 07/16/2018 12/19/2017 12/05/2017 11/16/2017 09/13/2017 07/20/2017 11/07/2016  PHQ - 2 Score 0 0 0 0 0 0 0    Assessment:  Fasting blood sugar is  123 A1C decreased from 10.5 to 7.1 Patient is eating healthier Patient is weighing 3 x week Patient has not started an exercise routine  Plan:   RN discussed  A1C progress RN discussed dietary habits and adding snacks RN discussed patient need for exercise routine RN will follow up within the month of November Patient will follow up with flu shot and health maintenance checks RN sent patient educational material on hypertension  [employer] [facility] [phone]

## 2018-07-18 ENCOUNTER — Other Ambulatory Visit: Payer: Self-pay | Admitting: Internal Medicine

## 2018-07-26 ENCOUNTER — Other Ambulatory Visit: Payer: Self-pay | Admitting: Internal Medicine

## 2018-07-26 NOTE — Telephone Encounter (Signed)
Copied from [place] [phone]. Topic: Quick Communication - See Telephone Encounter >> Jul 26, 2018  2:42 PM Conception Chancy, NT wrote: CRM for notification. See Telephone encounter for: 07/26/18.  Patient is requesting a refill on the following medications.  rosuvastatin (CRESTOR) 20 MG tablet hydrochlorothiazide (HYDRODIURIL) tablet 12.5mg  ( I do not see this on the patient medication list, he states it was filled 05/28/18. isosorbide mononitrate (IMDUR) 30 MG 24 hr tablet clopidogrel (PLAVIX) 75 MG tablet  [id number] [place], [place] - [address] [place] [address] Phone: [phone] Fax: [phone]

## 2018-07-26 NOTE — Telephone Encounter (Signed)
  Patient is requesting a refill on the following medications.  rosuvastatin (CRESTOR) 20 MG tablet hydrochlorothiazide (HYDRODIURIL) tablet 12.5mg  ( I do not see this on the patient medication list, he states it was filled 05/28/18. isosorbide mononitrate (IMDUR) 30 MG 24 hr tablet clopidogrel (PLAVIX) 75 MG tablet  [id number] [place], [place] - [address] [place] [address] Phone: [phone] Fax: [phone]

## 2018-07-30 MED ORDER — CLOPIDOGREL BISULFATE 75 MG PO TABS: 75.0000 mg | ORAL_TABLET | Freq: Every day | ORAL | 0 refills | Status: DC

## 2018-07-30 MED ORDER — ROSUVASTATIN CALCIUM 20 MG PO TABS: 20.0000 mg | ORAL_TABLET | Freq: Every day | ORAL | 0 refills | Status: DC

## 2018-07-30 NOTE — Telephone Encounter (Signed)
Rx sent through e-scribe  

## 2018-08-01 DIAGNOSIS — H2513 Age-related nuclear cataract, bilateral: Secondary | ICD-10-CM | POA: Diagnosis not present

## 2018-08-01 DIAGNOSIS — H5703 Miosis: Secondary | ICD-10-CM | POA: Diagnosis not present

## 2018-08-01 DIAGNOSIS — E119 Type 2 diabetes mellitus without complications: Secondary | ICD-10-CM | POA: Diagnosis not present

## 2018-08-01 DIAGNOSIS — H47321 Drusen of optic disc, right eye: Secondary | ICD-10-CM | POA: Diagnosis not present

## 2018-08-01 DIAGNOSIS — H43821 Vitreomacular adhesion, right eye: Secondary | ICD-10-CM | POA: Diagnosis not present

## 2018-08-01 NOTE — Telephone Encounter (Signed)
I do not see where Imdur or HCTZ has been filled by you and pt is currently on Lasix... Please advise

## 2018-08-01 NOTE — Addendum Note (Signed)
Addended by: Lurlean Nanny on: 08/01/2018 03:32 PM   Modules accepted: Orders

## 2018-08-01 NOTE — Telephone Encounter (Signed)
Pt calling.  States that the medication isn't at the pharmacy - after looking it appears medication went to [facility].  [facility]

## 2018-08-02 MED ORDER — CLOPIDOGREL BISULFATE 75 MG PO TABS: 75.0000 mg | ORAL_TABLET | Freq: Every day | ORAL | 0 refills | Status: DC

## 2018-08-02 MED ORDER — ROSUVASTATIN CALCIUM 20 MG PO TABS: 20.0000 mg | ORAL_TABLET | Freq: Every day | ORAL | 0 refills | Status: DC

## 2018-08-02 NOTE — Telephone Encounter (Signed)
Rx sent through e-scribe  

## 2018-08-02 NOTE — Addendum Note (Signed)
Addended by: Lurlean Nanny on: 08/02/2018 01:29 PM   Modules accepted: Orders

## 2018-08-02 NOTE — Telephone Encounter (Signed)
He is on Lisinopril HCT and Lasix. [facility]

## 2018-08-05 ENCOUNTER — Ambulatory Visit (INDEPENDENT_AMBULATORY_CARE_PROVIDER_SITE_OTHER): Payer: Medicare Other | Admitting: *Deleted

## 2018-08-05 DIAGNOSIS — I639 Cerebral infarction, unspecified: Secondary | ICD-10-CM | POA: Diagnosis not present

## 2018-08-05 NOTE — Progress Notes (Signed)
Carelink Summary Report / Loop Recorder 

## 2018-08-06 NOTE — Progress Notes (Signed)
[facility]  PATIENT: Jake Jake Gross DOB: 09/30/1941   REASON FOR VISIT: Follow-up for stroke in September 2018 with risk factors of hypertension diabetes obesity hyperlipidemia HISTORY FROM: Patient    HISTORY OF PRESENT ILLNESS:UPDATE 10/23/2019CM Jake Jake Gross, 77 year old male returns for follow-up with history of stroke in September 2018.  He remains on Plavix for secondary stroke prevention with no bruising and no bleeding.  He has not had further stroke or TIA symptoms.  Loop recorder so far no atrial fibrillation.  He remains on Crestor without muscle aches.  He gets little exercise.  Blood pressure in the office today 151/74.  Most recent hemoglobin A1c 06/12/2018 was 7.1 which was improved from 10.5 (3) months earlier.  He continues to check his CBGs daily.  He is due for follow-up with his primary care.  He returns for reevaluation    UPDATE 4/23/2019CM Jake Jake Gross, 77 year old male returns for follow-up with history of stroke in September 2018.  He is currently on Plavix for secondary stroke prevention without further stroke or TIA symptoms.  He has no signs of bleeding.  He remains on Crestor without muscle aches.  Loop recorder so far no atrial fibrillation.  Blood pressure in the office today 159/80 he claims he is compliant with his medications.  He checks his CBGs daily recent readings 114-234.  Most recent hemoglobin A1c  01/28/2018 was 9.7.  Most recent LDL 61 on 12/14/2017.  He is not exercising and was encouraged to do so.  He returns for reevaluation 08/07/17 PSMr. Jake Gross is a pleasant 77 year old Caucasian male seen today for the first office follow-up visit following hospital admission for stroke in September 2018. History is obtained from the patient, review of electronic medical records and have personally reviewed imaging films.Jake Councilis an 77 y.o.malewith hypertension, hyperlipidemia, RA, and diabetes presenting by [facility] to the ED for  sudden-onsetR-sided weakness. He attempted to stand up and fell. MRI showed a left parietal stroke.Patient was admitted and stroke workup was initiated. Date last known well: Date: 06/20/2017 Time last known well: Time: 13:00 NIHSSstroke scale of 0.Patient was not administered IV t-PA secondary to arriving outside of the tPA treatment window. He was admitted to General Neurology for further evaluation and treatment. MRI scan of the brain showed posterior left insular and superior parietal cortex infarct extending into the subcortical white matter. MRA of the brain showed no large vessel stenosis or occlusion. Carotid Doppler showed no significant extracranial stenosis. Transthoracic echo showed normal ejection fraction. LDL cholesterol 56 g percent and hemoglobin A1c was elevated at 7.6. Patient was started on Plavix for stroke prevention. He had outpatient transesophageal echocardiogram done which showed no Growth of embolism. Plan was to have outpatient loop recorder inserted but due to some misunderstanding that has not happened. The patient does recall there was a phone call from cardiology to call them but he did not call them. Patient has noticed improvement in his right hand strength. Still getting home occupational and physical therapy. His block walking and gait have improved back to baseline. He still has some diminished fine motor skills and weakness in the right hand. He states his sugars are not yet well controlled and fasting sugar recently wasn't greater than 200. His Lantus insulin dose has been increased recently by primary physician. His blood pressure is also been running high in the dose of his Cardizem was also increased from 120 to 180mg  recently. He is tolerating Crestor well without muscle aches and pains. He has  not yet had outpatient loop recorder done. He is tolerating Plavix well without bruising or bleeding.    REVIEW OF SYSTEMS: Full 14 system review of systems performed and  notable only for those listed, all others are neg:  Constitutional: neg  Cardiovascular: neg Ear/Nose/Throat: neg  Skin: neg Eyes: neg Respiratory: neg Gastroitestinal: neg  Hematology/Lymphatic: neg  Endocrine: neg Musculoskeletal:neg Allergy/Immunology: neg Neurological: neg Psychiatric: neg Sleep : neg   ALLERGIES: No Known Allergies  HOME MEDICATIONS: Outpatient Medications Prior to Visit  Medication Sig Dispense Refill  . CARTIA XT 240 MG 24 hr capsule TAKE 1 CAPSULE DAILY 90 capsule 0  . clopidogrel (PLAVIX) 75 MG tablet Take 1 tablet (75 mg total) by mouth daily. 90 tablet 0  . fenofibrate micronized (LOFIBRA) 67 MG capsule TAKE 1 CAPSULE DAILY BEFORE BREAKFAST 90 capsule 1  . furosemide (LASIX) 20 MG tablet Take 1 tablet (20 mg total) by mouth daily. 3 tablet 0  . Insulin Glargine (LANTUS SOLOSTAR) 100 UNIT/ML Solostar Pen Inject 110 Units into the skin every morning. 45 pen 11  . isosorbide mononitrate (IMDUR) 30 MG 24 hr tablet Take 1 tablet (30 mg total) by mouth daily. For BP 30 tablet 3  . lisinopril-hydrochlorothiazide (PRINZIDE,ZESTORETIC) 20-25 MG tablet TAKE 1 TABLET BY MOUTH EVERY DAY 90 tablet 0  . metoprolol succinate (TOPROL-XL) 25 MG 24 hr tablet Take 1 tablet (25 mg total) by mouth daily. 90 tablet 3  . ONE TOUCH ULTRA TEST test strip USE 1 STRIP TWICE A DAY 200 each 3  . ONETOUCH DELICA LANCETS 33A MISC     . rosuvastatin (CRESTOR) 20 MG tablet Take 1 tablet (20 mg total) by mouth at bedtime. 90 tablet 0  . diltiazem (CARDIZEM LA) 240 MG 24 hr tablet Take 1 tablet (240 mg total) by mouth daily. 90 tablet 1   No facility-administered medications prior to visit.     PAST MEDICAL HISTORY: Past Medical History:  Diagnosis Date  . 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 Arthritis   . Cervical spondylosis   . Diabetes mellitus   . Hyperlipidemia   . Hypertension   . Stroke ([place]) 06/19/2017  . Vitamin  D deficiency     PAST SURGICAL HISTORY: Past Surgical History:  Procedure Laterality Date  . ABCESS DRAINAGE Left    buttocks  . CARDIOVASCULAR STRESS TEST  10/12/1999   EF 63%. NO ISCHEMIA  . COLONOSCOPY W/ POLYPECTOMY     5 polyps  . EUS N/A 03/11/2015   Procedure: LOWER ENDOSCOPIC ULTRASOUND (EUS);  Surgeon: Milus Banister, MD;  Location: [facility];  Service: Endoscopy;  Laterality: N/A;  . FLEXIBLE SIGMOIDOSCOPY N/A 02/02/2015   Procedure: FLEXIBLE SIGMOIDOSCOPY;  Surgeon: Inda Castle, MD;  Location: [facility];  Service: Endoscopy;  Laterality: N/A;  ERBE  . LOOP RECORDER INSERTION N/A 08/14/2017   Procedure: LOOP RECORDER INSERTION;  Surgeon: Thompson Grayer, MD;  Location: [place] [facility];  Service: Cardiovascular;  Laterality: N/A;  . PARTIAL PROCTECTOMY BY TEM N/A 04/08/2015   Procedure: TEM PARTIAL PROCTECTOMY OF RECTAL MASS;  Surgeon: Michael Boston, MD;  Location: [facility];  Service: General;  Laterality: N/A;  . TEE WITHOUT CARDIOVERSION N/A 06/25/2017   Procedure: TRANSESOPHAGEAL ECHOCARDIOGRAM (TEE);  Surgeon: Skeet Latch, MD;  Location: [place];  Service: Cardiovascular;  Laterality: N/A;  . TONSILLECTOMY AND ADENOIDECTOMY     as child    FAMILY HISTORY: Family History  Problem  Relation Age of Onset  . Cancer Father        "all over"  . Coronary artery disease Brother   . Diabetes Brother   . Stroke Mother   . Diabetes Mother   . Cancer Brother   . Colon cancer Neg Hx   . Esophageal cancer Neg Hx   . Rectal cancer Neg Hx   . Stomach cancer Neg Hx     SOCIAL HISTORY: Social History   Socioeconomic History  . Marital status: Divorced    Spouse name: Not on file  . Number of children: 2  . Years of education: Not on file  . Highest education level: Not on file  Occupational History  . Occupation: retired    Fish farm manager: [employer]  Social Needs  . Financial resource strain: Not on file  . Food insecurity:    Worry: Not on file    Inability:  Not on file  . Transportation needs:    Medical: Not on file    Non-medical: Not on file  Tobacco Use  . Smoking status: Former Smoker    Types: Cigarettes    Last attempt to quit: 08/13/1994    Years since quitting: 24.0  . Smokeless tobacco: Never Used  Substance and Sexual Activity  . Alcohol use: No    Alcohol/week: 0.0 standard drinks  . Drug use: No  . Sexual activity: Not Currently  Lifestyle  . Physical activity:    Days per week: Not on file    Minutes per session: Not on file  . Stress: Not on file  Relationships  . Social connections:    Talks on phone: Not on file    Gets together: Not on file    Attends religious service: Not on file    Active member of club or organization: Not on file    Attends meetings of clubs or organizations: Not on file    Relationship status: Not on file  . Intimate partner violence:    Fear of current or ex partner: Not on file    Emotionally abused: Not on file    Physically abused: Not on file    Forced sexual activity: Not on file  Other Topics Concern  . Not on file  Social History Narrative  . Not on file     PHYSICAL EXAM  Vitals:   08/07/18 1439  BP: (!) 151/75  Pulse: (!) 54  SpO2: 97%  Weight: 223 lb 3.2 oz (101.2 kg)  Height: 5\' 7"  (1.702 m)   Body mass index is 34.96 kg/m.  Generalized: Well developed, obese male in no acute distress  Head: normocephalic and atraumatic,. Oropharynx benign  Neck: Supple, no carotid bruits  Cardiac: Regular rate rhythm, no murmur  Musculoskeletal: No deformity   Neurological examination   Mentation: Alert oriented to time, place, history taking. Attention span and concentration appropriate. Recent and remote memory intact.  Follows all commands speech and language fluent.   Cranial nerve II-XII: Pupils were equal round reactive to light extraocular movements were full, visual field were full on confrontational test. Facial sensation and strength were normal. hearing was  intact to finger rubbing bilaterally. Uvula tongue midline. head turning and shoulder shrug were normal and symmetric.Tongue protrusion into cheek strength was normal. Motor: normal bulk and tone, full strength in the BUE, BLE,  Sensory: normal and symmetric to light touch,  Coordination: finger-nose-finger, heel-to-shin bilaterally, no dysmetria Reflexes: 1+ upper and lower and symmetric except depressed ankle jerks , plantar  responses were flexor bilaterally. Gait and Station: Rising up from seated position without assistance, normal stance,  moderate stride, good arm swing, smooth turning, able to perform tiptoe, and heel walking without difficulty. Tandem gait is unsteady.  No assistive device  DIAGNOSTIC DATA (LABS, IMAGING, TESTING) - I reviewed patient records, labs, notes, testing and imaging myself where available.  Lab Results  Component Value Date   WBC 8.2 03/26/2018   HGB 14.0 03/26/2018   HCT 43.5 03/26/2018   MCV 85.1 03/26/2018   PLT 172 03/26/2018      Component Value Date/Time   NA 143 04/24/2018 1618   NA 141 01/19/2016   K 5.2 (H) 05/07/2018 1249   CL 111 04/24/2018 1618   CO2 23 04/24/2018 1618   GLUCOSE 123 (H) 04/24/2018 1618   BUN 34 (H) 04/24/2018 1618   BUN 34 (A) 01/19/2016   CREATININE 1.50 04/24/2018 1618   CREATININE 1.14 11/07/2016 1514   CALCIUM 9.0 04/24/2018 1618   PROT 6.7 03/25/2018 1422   ALBUMIN 3.0 (L) 03/25/2018 1422   AST 21 03/25/2018 1422   ALT 18 03/25/2018 1422   ALKPHOS 77 03/25/2018 1422   BILITOT 0.7 03/25/2018 1422   GFRNONAA 46 (L) 03/27/2018 0644   GFRAA 53 (L) 03/27/2018 0644   Lab Results  Component Value Date   CHOL 120 03/22/2018   HDL 26.20 (L) 03/22/2018   LDLCALC 56 06/22/2017   LDLDIRECT 54.0 03/22/2018   TRIG 382.0 (H) 03/22/2018   CHOLHDL 5 03/22/2018   Lab Results  Component Value Date   HGBA1C 7.1 (A) 06/12/2018    ASSESSMENT AND PLAN   16 year Caucasian male with 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 Multiple vascular risk factors of hypertension, diabetes, hyperlipidemia and obesity.  Loop recorder so far no atrial fibrillation    PLAN: Stressed the importance of management of risk factors to prevent further stroke Continue Plavix for secondary stroke prevention Maintain strict control of hypertension with blood pressure goal below 130/90, today's reading 151/74 continue antihypertensive medications Control of diabetes with hemoglobin A1c below 6.5 continue diabetic medications and checking CBGs Cholesterol with LDL cholesterol less than 70,  continue statin drug Crestor  Exercise by walking, recommend 30 minutes daily   eat healthy diet with whole grains,  fresh fruits and vegetables Follow-up with primary care for stroke risk factor modification, maintain blood pressure goal less than 967 systolic, diabetes with E9F below 7, lipids with LDL below 70 Loop recorder so far no atrial fibrillation Will discharge from stroke clinic I spent 25 min  in total face to face time with the patient more than 50% of which was spent counseling and coordination of care, reviewing test results reviewing medications and discussing and reviewing the diagnosis of stroke and management of risk factors.  Patient was also given written information on managing risk factors for later review. Dennie Bible, [facility], [facility], APRN  [facility] [address], [place], [place] [address] [phone]

## 2018-08-07 ENCOUNTER — Encounter: Payer: Self-pay | Admitting: Nurse Practitioner

## 2018-08-07 ENCOUNTER — Ambulatory Visit (INDEPENDENT_AMBULATORY_CARE_PROVIDER_SITE_OTHER): Payer: Medicare Other | Admitting: Nurse Practitioner

## 2018-08-07 VITALS — BP 151/75 | HR 54 | Ht 67.0 in | Wt 223.2 lb

## 2018-08-07 DIAGNOSIS — I6389 Other cerebral infarction: Secondary | ICD-10-CM | POA: Diagnosis not present

## 2018-08-07 DIAGNOSIS — E785 Hyperlipidemia, unspecified: Secondary | ICD-10-CM

## 2018-08-07 DIAGNOSIS — I10 Essential (primary) hypertension: Secondary | ICD-10-CM

## 2018-08-07 NOTE — Patient Instructions (Addendum)
Stressed the importance of management of risk factors to prevent further stroke Continue Plavix for secondary stroke prevention Maintain strict control of hypertension with blood pressure goal below 130/90, today's reading 151/74 continue antihypertensive medications Control of diabetes with hemoglobin A1c below 6.5 continue diabetic medications and checking CBGs Cholesterol with LDL cholesterol less than 70,  continue statin drug Crestor  Exercise by walking, recommend 30 minutes daily   eat healthy diet with whole grains,  fresh fruits and vegetables Follow-up with primary care for stroke risk factor modification, maintain blood pressure goal less than 960 systolic, diabetes with A5W below 7, lipids with LDL below 70 Loop recorder so far no atrial fibrillation Will discharge from stroke clinic  Stroke Prevention Some health problems and behaviors may make it more likely for you to have a stroke. Below are ways to lessen your risk of having a stroke.  Be active for at least 30 minutes on most or all days.  Do not smoke. Try not to be around others who smoke.  Do not drink too much alcohol. ? Do not have more than 2 drinks a day if you are a man. ? Do not have more than 1 drink a day if you are a woman and are not pregnant.  Eat healthy foods, such as fruits and vegetables. If you were put on a specific diet, follow the diet as told.  Keep your cholesterol levels under control through diet and medicines. Look for foods that are low in saturated fat, trans fat, cholesterol, and are high in fiber.  If you have diabetes, follow all diet plans and take your medicine as told.  Ask your doctor if you need treatment to lower your blood pressure. If you have high blood pressure (hypertension), follow all diet plans and take your medicine as told by your doctor.  If you are 67-78 years old, have your blood pressure checked every 3-5 years. If you are age 23 or older, have your blood pressure  checked every year.  Keep a healthy weight. Eat foods that are low in calories, salt, saturated fat, trans fat, and cholesterol.  Do not take drugs.  Avoid birth control pills, if this applies. Talk to your doctor about the risks of taking birth control pills.  Talk to your doctor if you have sleep problems (sleep apnea).  Take all medicine as told by your doctor. ? You may be told to take aspirin or blood thinner medicine. Take this medicine as told by your doctor. ? Understand your medicine instructions.  Make sure any other conditions you have are being taken care of.  Get help right away if:  You suddenly lose feeling (you feel numb) or have weakness in your face, arm, or leg.  Your face or eyelid hangs down to one side.  You suddenly feel confused.  You have trouble talking (aphasia) or understanding what people are saying.  You suddenly have trouble seeing in one or both eyes.  You suddenly have trouble walking.  You are dizzy.  You lose your balance or your movements are clumsy (uncoordinated).  You suddenly have a very bad headache and you do not know the cause.  You have new chest pain.  Your heart feels like it is fluttering or skipping a beat (irregular heartbeat). Do not wait to see if the symptoms above go away. Get help right away. Call your local emergency services (911 in [place] Do not drive yourself to the hospital. This information is not intended  to replace advice given to you by your health care provider. Make sure you discuss any questions you have with your health care provider. Document Released: 04/02/2012 Document Revised: 03/09/2016 Document Reviewed: 04/04/2013 Elsevier Interactive Patient Education  [employer].

## 2018-08-07 NOTE — Progress Notes (Signed)
I agree with the above plan 

## 2018-08-15 ENCOUNTER — Other Ambulatory Visit: Payer: Self-pay | Admitting: Internal Medicine

## 2018-08-15 DIAGNOSIS — I10 Essential (primary) hypertension: Secondary | ICD-10-CM

## 2018-08-28 LAB — CUP PACEART REMOTE DEVICE CHECK
Date Time Interrogation Session: 20191019223540
Implantable Pulse Generator Implant Date: 20181030

## 2018-09-05 ENCOUNTER — Ambulatory Visit (INDEPENDENT_AMBULATORY_CARE_PROVIDER_SITE_OTHER): Payer: Medicare Other

## 2018-09-05 DIAGNOSIS — I639 Cerebral infarction, unspecified: Secondary | ICD-10-CM

## 2018-09-06 NOTE — Progress Notes (Signed)
Carelink Summary Report / Loop Recorder 

## 2018-09-11 ENCOUNTER — Other Ambulatory Visit: Payer: Self-pay | Admitting: *Deleted

## 2018-10-03 ENCOUNTER — Ambulatory Visit (INDEPENDENT_AMBULATORY_CARE_PROVIDER_SITE_OTHER): Payer: Medicare Other | Admitting: Endocrinology

## 2018-10-03 ENCOUNTER — Encounter: Payer: Self-pay | Admitting: Endocrinology

## 2018-10-03 VITALS — BP 160/84 | HR 57 | Temp 98.0°F | Ht 67.0 in | Wt 227.0 lb

## 2018-10-03 DIAGNOSIS — I6389 Other cerebral infarction: Secondary | ICD-10-CM | POA: Diagnosis not present

## 2018-10-03 DIAGNOSIS — Z794 Long term (current) use of insulin: Secondary | ICD-10-CM

## 2018-10-03 DIAGNOSIS — E1165 Type 2 diabetes mellitus with hyperglycemia: Secondary | ICD-10-CM | POA: Diagnosis not present

## 2018-10-03 LAB — POCT GLYCOSYLATED HEMOGLOBIN (HGB A1C): HEMOGLOBIN A1C: 6.8 % — AB (ref 4.0–5.6)

## 2018-10-03 MED ORDER — INSULIN GLARGINE 100 UNIT/ML SOLOSTAR PEN: 100.0000 [IU] | PEN_INJECTOR | SUBCUTANEOUS | 11 refills | Status: DC

## 2018-10-03 NOTE — Progress Notes (Signed)
Subjective:    Patient ID: Jake Gross, male    DOB: May 09, 1941, 77 y.o.   MRN: [id number]  HPI Pt returns for f/u of diabetes mellitus:  DM type: Insulin-requiring type 2 DM.   Dx'ed: 2423 Complications: polyneuropathy, retinopathy, CVA, and renal insufficiency.  Therapy: insulin since 2005 DKA: never Severe hypoglycemia: last episode was in 2014.   Pancreatitis: never.  Pancreatic imaging: normal CT in 2006 Other: he eats 2 meals per day; he takes multiple daily injections.  Interval history: no cbg record, but states cbg varies from 89-148.  There is no trend throughout the day.  He takes 110 units qam.  Past Medical History:  Diagnosis Date  . 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 Arthritis   . Cervical spondylosis   . Diabetes mellitus   . Hyperlipidemia   . Hypertension   . Stroke ([place]) 06/19/2017  . Vitamin D deficiency     Past Surgical History:  Procedure Laterality Date  . ABCESS DRAINAGE Left    buttocks  . CARDIOVASCULAR STRESS TEST  10/12/1999   EF 63%. NO ISCHEMIA  . COLONOSCOPY W/ POLYPECTOMY     5 polyps  . EUS N/A 03/11/2015   Procedure: LOWER ENDOSCOPIC ULTRASOUND (EUS);  Surgeon: Milus Banister, MD;  Location: [facility];  Service: Endoscopy;  Laterality: N/A;  . FLEXIBLE SIGMOIDOSCOPY N/A 02/02/2015   Procedure: FLEXIBLE SIGMOIDOSCOPY;  Surgeon: Inda Castle, MD;  Location: [facility];  Service: Endoscopy;  Laterality: N/A;  ERBE  . LOOP RECORDER INSERTION N/A 08/14/2017   Procedure: LOOP RECORDER INSERTION;  Surgeon: Thompson Grayer, MD;  Location: [facility];  Service: Cardiovascular;  Laterality: N/A;  . PARTIAL PROCTECTOMY BY TEM N/A 04/08/2015   Procedure: TEM PARTIAL PROCTECTOMY OF RECTAL MASS;  Surgeon: Michael Boston, MD;  Location: [facility];  Service: General;  Laterality: N/A;  . TEE WITHOUT CARDIOVERSION N/A 06/25/2017   Procedure: TRANSESOPHAGEAL ECHOCARDIOGRAM (TEE);   Surgeon: Skeet Latch, MD;  Location: [facility];  Service: Cardiovascular;  Laterality: N/A;  . TONSILLECTOMY AND ADENOIDECTOMY     as child    Social History   Socioeconomic History  . Marital status: Divorced    Spouse name: Not on file  . Number of children: 2  . Years of education: Not on file  . Highest education level: Not on file  Occupational History  . Occupation: retired    Fish farm manager: [employer]  Social Needs  . Financial resource strain: Not on file  . Food insecurity:    Worry: Not on file    Inability: Not on file  . Transportation needs:    Medical: Not on file    Non-medical: Not on file  Tobacco Use  . Smoking status: Former Smoker    Types: Cigarettes    Last attempt to quit: 08/13/1994    Years since quitting: 24.1  . Smokeless tobacco: Never Used  Substance and Sexual Activity  . Alcohol use: No    Alcohol/week: 0.0 standard drinks  . Drug use: No  . Sexual activity: Not Currently  Lifestyle  . Physical activity:    Days per week: Not on file    Minutes per session: Not on file  . Stress: Not on file  Relationships  . Social connections:    Talks on phone: Not on file    Gets together: Not on file    Attends religious service: Not on file  Active member of club or organization: Not on file    Attends meetings of clubs or organizations: Not on file    Relationship status: Not on file  . Intimate partner violence:    Fear of current or ex partner: Not on file    Emotionally abused: Not on file    Physically abused: Not on file    Forced sexual activity: Not on file  Other Topics Concern  . Not on file  Social History Narrative  . Not on file    Current Outpatient Medications on File Prior to Visit  Medication Sig Dispense Refill  . CARTIA XT 240 MG 24 hr capsule TAKE 1 CAPSULE DAILY 90 capsule 0  . clopidogrel (PLAVIX) 75 MG tablet Take 1 tablet (75 mg total) by mouth daily. 90 tablet 0  . fenofibrate micronized (LOFIBRA) 67 MG capsule  TAKE 1 CAPSULE DAILY BEFORE BREAKFAST 90 capsule 1  . furosemide (LASIX) 20 MG tablet Take 1 tablet (20 mg total) by mouth daily. 3 tablet 0  . isosorbide mononitrate (IMDUR) 30 MG 24 hr tablet Take 1 tablet (30 mg total) by mouth daily. For BP 30 tablet 3  . lisinopril-hydrochlorothiazide (PRINZIDE,ZESTORETIC) 20-25 MG tablet TAKE 1 TABLET BY MOUTH EVERY DAY 90 tablet 0  . metoprolol succinate (TOPROL-XL) 25 MG 24 hr tablet Take 1 tablet (25 mg total) by mouth daily. 90 tablet 3  . ONE TOUCH ULTRA TEST test strip USE 1 STRIP TWICE A DAY 200 each 3  . ONETOUCH DELICA LANCETS 62M MISC     . rosuvastatin (CRESTOR) 20 MG tablet Take 1 tablet (20 mg total) by mouth at bedtime. 90 tablet 0   No current facility-administered medications on file prior to visit.     No Known Allergies  Family History  Problem Relation Age of Onset  . Cancer Father        "all over"  . Coronary artery disease Brother   . Diabetes Brother   . Stroke Mother   . Diabetes Mother   . Cancer Brother   . Colon cancer Neg Hx   . Esophageal cancer Neg Hx   . Rectal cancer Neg Hx   . Stomach cancer Neg Hx     BP (!) 160/84 (BP Location: Left Arm, Patient Position: Sitting, Cuff Size: Large)   Pulse (!) 57   Temp 98 F (36.7 C) (Oral)   Ht 5\' 7"  (1.702 m)   Wt 227 lb (103 kg)   SpO2 96%   BMI 35.55 kg/m    Review of Systems He denies hypoglycemia    Objective:   Physical Exam VITAL SIGNS:  See vs page.   GENERAL: no distress.  Pulses: foot pulses are intact bilaterally.   MSK: no deformity of the feet or ankles.  CV: 2+ bilat edema of the legs.  Skin:  no ulcer on the feet or ankles.  normal temp on the feet and ankle, but there is spotty hyperpigmentation and erythema of the legs.   Neuro: sensation is intact to touch on the feet and ankles, but decreased from normal Ext: There is bilateral onychomycosis of the toenails.   Lab Results  Component Value Date   HGBA1C 6.8 (A) 10/03/2018   Lab  Results  Component Value Date   CREATININE 1.50 04/24/2018   BUN 34 (H) 04/24/2018   NA 143 04/24/2018   K 5.2 (H) 05/07/2018   CL 111 04/24/2018   CO2 23 04/24/2018  Assessment & Plan:  Insulin-requiring type 2 DM, with CVA: overcontrolled, given this insulin regimen, which does match insulin to her changing needs throughout the day Renal insuff: he is at risk for nocturnal hypoglycemia Edema: this limits rx options HTN: is noted today  Patient Instructions  Your blood pressure is high, and heart rate is slow today.  Please see your primary care provider soon, to have these rechecked. check your blood sugar twice a day.  vary the time of day when you check, between before the 3 meals, and at bedtime.  also check if you have symptoms of your blood sugar being too high or too low.  please keep a record of the readings and bring it to your next appointment here (or you can bring the meter itself).  You can write it on any piece of paper.  please call us sooner if your blood sugar goes below 70, or if you have a lot of readings over 200. Please reduce the lantus to 100 units each morning.   Please come back for a follow-up appointment in 3-4 months.

## 2018-10-03 NOTE — Patient Instructions (Addendum)
Your blood pressure is high, and heart rate is slow today.  Please see your primary care provider soon, to have these rechecked. check your blood sugar twice a day.  vary the time of day when you check, between before the 3 meals, and at bedtime.  also check if you have symptoms of your blood sugar being too high or too low.  please keep a record of the readings and bring it to your next appointment here (or you can bring the meter itself).  You can write it on any piece of paper.  please call us sooner if your blood sugar goes below 70, or if you have a lot of readings over 200. Please reduce the lantus to 100 units each morning.   Please come back for a follow-up appointment in 3-4 months.

## 2018-10-08 ENCOUNTER — Ambulatory Visit (INDEPENDENT_AMBULATORY_CARE_PROVIDER_SITE_OTHER): Payer: Medicare Other

## 2018-10-08 DIAGNOSIS — I639 Cerebral infarction, unspecified: Secondary | ICD-10-CM | POA: Diagnosis not present

## 2018-10-09 LAB — CUP PACEART REMOTE DEVICE CHECK
Implantable Pulse Generator Implant Date: 20181030
MDC IDC SESS DTM: 20191224233642

## 2018-10-10 ENCOUNTER — Other Ambulatory Visit: Payer: Self-pay | Admitting: Internal Medicine

## 2018-10-10 NOTE — Progress Notes (Signed)
Carelink Summary Report / Loop Recorder 

## 2018-10-13 ENCOUNTER — Other Ambulatory Visit: Payer: Self-pay | Admitting: Internal Medicine

## 2018-10-20 LAB — CUP PACEART REMOTE DEVICE CHECK
Implantable Pulse Generator Implant Date: 20181030
MDC IDC SESS DTM: 20191121233923

## 2018-10-28 ENCOUNTER — Other Ambulatory Visit: Payer: Self-pay | Admitting: Internal Medicine

## 2018-11-08 NOTE — Patient Outreach (Signed)
Jake Gross) Care Management  11/08/2018 Late entry  [employer] 18-Jul-1941 [id number]  Jake Gross telephone call to patient.  [employer] compliance verified.  Per patient has lost 8 pounds. Patient stated that he is mostly eating salads.  Fasting blood sugar is 111. Patient stated he feels good. BP is 127/70. Patient has not had any current falls. Patient has a medical alert. Patient has agreed to follow up outreach calls.  Current Medications:  Current Outpatient Medications  Medication Sig Dispense Refill  . CARTIA XT 240 MG 24 hr capsule TAKE 1 CAPSULE DAILY 90 capsule 0  . clopidogrel (PLAVIX) 75 MG tablet Take 1 tablet (75 mg total) by mouth daily. MUST SCHEDULE ANNUAL EXAM 90 tablet 0  . fenofibrate micronized (LOFIBRA) 67 MG capsule TAKE 1 CAPSULE DAILY BEFORE BREAKFAST 90 capsule 1  . furosemide (LASIX) 20 MG tablet Take 1 tablet (20 mg total) by mouth daily. 3 tablet 0  . Insulin Glargine (LANTUS SOLOSTAR) 100 UNIT/ML Solostar Pen Inject 100 Units into the skin every morning. 45 pen 11  . isosorbide mononitrate (IMDUR) 30 MG 24 hr tablet Take 1 tablet (30 mg total) by mouth daily. For BP 30 tablet 3  . lisinopril-hydrochlorothiazide (PRINZIDE,ZESTORETIC) 20-25 MG tablet Take 1 tablet by mouth daily. MUST SCHEDULE ANNUAL EXAM 90 tablet 0  . metoprolol succinate (TOPROL-XL) 25 MG 24 hr tablet Take 1 tablet (25 mg total) by mouth daily. 90 tablet 3  . ONE TOUCH ULTRA TEST test strip USE 1 STRIP TWICE A DAY 200 each 3  . ONETOUCH DELICA LANCETS 93Y MISC     . rosuvastatin (CRESTOR) 20 MG tablet Take 1 tablet (20 mg total) by mouth at bedtime. MUST SCHEDULE ANNUAL EXAM 90 tablet 0  . rosuvastatin (CRESTOR) 20 MG tablet Take 1 tablet (20 mg total) by mouth at bedtime. MUST SCHEDULE ANNUAL PHYSICAL 90 tablet 0   No current facility-administered medications for this visit.     Functional Status:  In your present state of health, do you have any difficulty  performing the following activities: 07/16/2018 03/26/2018  Hearing? N N  Vision? N N  Difficulty concentrating or making decisions? N N  Walking or climbing stairs? N Y  Dressing or bathing? N N  Doing errands, shopping? N N  Preparing Food and eating ? N -  Using the Toilet? N -  In the past six months, have you accidently leaked urine? Y -  Do you have problems with loss of bowel control? N -  Managing your Medications? N -  Managing your Finances? N -  Housekeeping or managing your Housekeeping? N -  Some recent data might be hidden    Fall/Depression Screening: Fall Risk  08/07/2018 07/16/2018 12/19/2017  Falls in the past year? Yes Yes Yes  Number falls in past yr: 1 1 1   Injury with Fall? Yes No No  Risk for fall due to : - History of fall(s);Impaired balance/gait;Impaired mobility History of fall(s);Impaired balance/gait;Impaired mobility  Follow up - Falls evaluation completed;Falls prevention discussed;Education provided Falls evaluation completed;Education provided;Falls prevention discussed   PHQ 2/9 Scores 07/16/2018 12/19/2017 12/05/2017 11/16/2017 09/13/2017 07/20/2017 11/07/2016  PHQ - 2 Score 0 0 0 0 0 0 0    Assessment:  Fasting blood sugar 111 Patient is mostly eating salads Per patient he has lost 8 lbs  Plan: Next PCP visit is 10/03/2018 Patient will follow up with health maintenance RN will follow up within the month of February  [facility] [phone]

## 2018-11-11 ENCOUNTER — Ambulatory Visit (INDEPENDENT_AMBULATORY_CARE_PROVIDER_SITE_OTHER): Payer: Medicare Other

## 2018-11-11 DIAGNOSIS — I639 Cerebral infarction, unspecified: Secondary | ICD-10-CM | POA: Diagnosis not present

## 2018-11-12 ENCOUNTER — Ambulatory Visit (INDEPENDENT_AMBULATORY_CARE_PROVIDER_SITE_OTHER): Payer: Medicare Other | Admitting: Internal Medicine

## 2018-11-12 ENCOUNTER — Encounter: Payer: Self-pay | Admitting: Internal Medicine

## 2018-11-12 VITALS — BP 148/72 | HR 54 | Temp 97.7°F | Wt 224.0 lb

## 2018-11-12 DIAGNOSIS — I639 Cerebral infarction, unspecified: Secondary | ICD-10-CM | POA: Diagnosis not present

## 2018-11-12 DIAGNOSIS — R079 Chest pain, unspecified: Secondary | ICD-10-CM | POA: Diagnosis not present

## 2018-11-12 DIAGNOSIS — I10 Essential (primary) hypertension: Secondary | ICD-10-CM | POA: Diagnosis not present

## 2018-11-12 DIAGNOSIS — R0602 Shortness of breath: Secondary | ICD-10-CM | POA: Diagnosis not present

## 2018-11-12 DIAGNOSIS — E782 Mixed hyperlipidemia: Secondary | ICD-10-CM

## 2018-11-12 LAB — LIPID PANEL
Cholesterol: 138 mg/dL (ref 0–200)
HDL: 25.6 mg/dL — AB (ref >39.00)
NonHDL: 112.87
TRIGLYCERIDES: 214 mg/dL — AB (ref 0.0–149.0)
Total CHOL/HDL Ratio: 5
VLDL: 42.8 mg/dL — ABNORMAL HIGH (ref 0.0–40.0)

## 2018-11-12 LAB — CREATININE KINASE MB: CK-MB: 7.6 ng/mL — ABNORMAL HIGH (ref 0.3–4.0)

## 2018-11-12 LAB — LDL CHOLESTEROL, DIRECT: Direct LDL: 80 mg/dL

## 2018-11-12 LAB — CUP PACEART REMOTE DEVICE CHECK
Date Time Interrogation Session: 20200126233656
Implantable Pulse Generator Implant Date: 20181030

## 2018-11-12 LAB — TROPONIN I: TNIDX: 0.04 ug/l (ref 0.00–0.06)

## 2018-11-12 MED ORDER — HYDRALAZINE HCL 25 MG PO TABS: 25.0000 mg | ORAL_TABLET | Freq: Three times a day (TID) | ORAL | 0 refills | Status: DC

## 2018-11-12 MED ORDER — HYDRALAZINE HCL 25 MG PO TABS: 25.0000 mg | ORAL_TABLET | Freq: Three times a day (TID) | ORAL | 1 refills | Status: DC

## 2018-11-12 NOTE — Addendum Note (Signed)
Addended by: Jearld Fenton on: 11/12/2018 03:47 PM   Modules accepted: Orders

## 2018-11-12 NOTE — Progress Notes (Signed)
Subjective:    Patient ID: [employer], male    DOB: [birth year], 78 y.o.   MRN: [id number]  HPI  Pt presents to the clinic today for HTN follow up. He is taking Lisinopril HCT, Metoprolol, Diltiazem and Furosemide as prescribed. He was prescribed Hydralazine during admission for his stroke but reports he has been out of it for 2 weeks. His BP today is 148/72. ECG from 03/2018 reviewed.  He also c/o intermittent shortness of breath. He reports he can not walk 25 yards without stopping to rest. It is worse with exertion. It never occurs at rest. He does have some chest pain in the chest and shoulders that gets better with rest. He has not seen his cardiologist in 2 years. He is taking Diltiazem, Plavix, Fenofibrate, Imdur, Plavix, Lisinopril-HCT, Metoprolol, and Rosuvastatin. He is out of his Hydralazine.  Review of Systems  Past Medical History:  Diagnosis Date  . 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 Arthritis   . Cervical spondylosis   . Diabetes mellitus   . Hyperlipidemia   . Hypertension   . Stroke ([place]) 06/19/2017  . Vitamin D deficiency     Current Outpatient Medications  Medication Sig Dispense Refill  . CARTIA XT 240 MG 24 hr capsule TAKE 1 CAPSULE DAILY 90 capsule 0  . clopidogrel (PLAVIX) 75 MG tablet Take 1 tablet (75 mg total) by mouth daily. MUST SCHEDULE ANNUAL EXAM 90 tablet 0  . fenofibrate micronized (LOFIBRA) 67 MG capsule TAKE 1 CAPSULE DAILY BEFORE BREAKFAST 90 capsule 1  . furosemide (LASIX) 20 MG tablet Take 1 tablet (20 mg total) by mouth daily. 3 tablet 0  . Insulin Glargine (LANTUS SOLOSTAR) 100 UNIT/ML Solostar Pen Inject 100 Units into the skin every morning. 45 pen 11  . isosorbide mononitrate (IMDUR) 30 MG 24 hr tablet Take 1 tablet (30 mg total) by mouth daily. For BP 30 tablet 3  . lisinopril-hydrochlorothiazide (PRINZIDE,ZESTORETIC) 20-25 MG tablet Take 1 tablet by mouth daily. MUST SCHEDULE  ANNUAL EXAM 90 tablet 0  . metoprolol succinate (TOPROL-XL) 25 MG 24 hr tablet Take 1 tablet (25 mg total) by mouth daily. 90 tablet 3  . ONE TOUCH ULTRA TEST test strip USE 1 STRIP TWICE A DAY 200 each 3  . ONETOUCH DELICA LANCETS 73A MISC     . rosuvastatin (CRESTOR) 20 MG tablet Take 1 tablet (20 mg total) by mouth at bedtime. MUST SCHEDULE ANNUAL EXAM 90 tablet 0  . rosuvastatin (CRESTOR) 20 MG tablet Take 1 tablet (20 mg total) by mouth at bedtime. MUST SCHEDULE ANNUAL PHYSICAL 90 tablet 0   No current facility-administered medications for this visit.     No Known Allergies  Family History  Problem Relation Age of Onset  . Cancer Father        "all over"  . Coronary artery disease Brother   . Diabetes Brother   . Stroke Mother   . Diabetes Mother   . Cancer Brother   . Colon cancer Neg Hx   . Esophageal cancer Neg Hx   . Rectal cancer Neg Hx   . Stomach cancer Neg Hx     Social History   Socioeconomic History  . Marital status: Divorced    Spouse name: Not on file  . Number of children: 2  . Years of education: Not on file  . Highest education level: Not on file  Occupational History  . Occupation:  retired    Fish farm manager: [employer]  Social Needs  . Financial resource strain: Not on file  . Food insecurity:    Worry: Not on file    Inability: Not on file  . Transportation needs:    Medical: Not on file    Non-medical: Not on file  Tobacco Use  . Smoking status: Former Smoker    Types: Cigarettes    Last attempt to quit: 08/13/1994    Years since quitting: 24.2  . Smokeless tobacco: Never Used  Substance and Sexual Activity  . Alcohol use: No    Alcohol/week: 0.0 standard drinks  . Drug use: No  . Sexual activity: Not Currently  Lifestyle  . Physical activity:    Days per week: Not on file    Minutes per session: Not on file  . Stress: Not on file  Relationships  . Social connections:    Talks on phone: Not on file    Gets together: Not on file    Attends  religious service: Not on file    Active member of club or organization: Not on file    Attends meetings of clubs or organizations: Not on file    Relationship status: Not on file  . Intimate partner violence:    Fear of current or ex partner: Not on file    Emotionally abused: Not on file    Physically abused: Not on file    Forced sexual activity: Not on file  Other Topics Concern  . Not on file  Social History Narrative  . Not on file     Constitutional: Pt reports fatigue. Denies fever, malaise, headache or abrupt weight changes.  HEENT: Denies eye pain, eye redness, ear pain, ringing in the ears, wax buildup, runny nose, nasal congestion, bloody nose, or sore throat. Respiratory: Pt reports exertional shortness of breath. Denies difficulty breathing, cough or sputum production.   Cardiovascular: Pt reports exertional chest pain, swelling in legs. Denies chest tightness, palpitations or swelling in the hands.  Neurological: Denies dizziness, difficulty with memory, difficulty with speech or problems with balance and coordination.    No other specific complaints in a complete review of systems (except as listed in HPI above).     Objective:   Physical Exam   BP (!) 148/72   Pulse (!) 54   Temp 97.7 F (36.5 C) (Oral)   Wt 224 lb (101.6 kg)   SpO2 96%   BMI 35.08 kg/m  Wt Readings from Last 3 Encounters:  11/12/18 224 lb (101.6 kg)  10/03/18 227 lb (103 kg)  08/07/18 223 lb 3.2 oz (101.2 kg)    General: Appears his stated age, obese, in NAD. Skin: Warm, dry and intact. No ulcerations noted. Vascular changes noted of BLE. Cardiovascular: Bradycardic with normal rhythm. S1,S2 noted.  No murmur, rubs or gallops noted. Trace pitting BLE edema. No carotid bruits noted. Pulmonary/Chest: Normal effort and positive vesicular breath sounds. No respiratory distress. No wheezes, rales or ronchi noted.  Neurological: Alert and oriented.    BMET    Component Value Date/Time     NA 143 04/24/2018 1618   NA 141 01/19/2016   K 5.2 (H) 05/07/2018 1249   CL 111 04/24/2018 1618   CO2 23 04/24/2018 1618   GLUCOSE 123 (H) 04/24/2018 1618   BUN 34 (H) 04/24/2018 1618   BUN 34 (A) 01/19/2016   CREATININE 1.50 04/24/2018 1618   CREATININE 1.14 11/07/2016 1514   CALCIUM 9.0 04/24/2018 1618  GFRNONAA 46 (L) 03/27/2018 0644   GFRAA 53 (L) 03/27/2018 0644    Lipid Panel     Component Value Date/Time   CHOL 120 03/22/2018 0925   TRIG 382.0 (H) 03/22/2018 0925   HDL 26.20 (L) 03/22/2018 0925   CHOLHDL 5 03/22/2018 0925   VLDL 76.4 (H) 03/22/2018 0925   LDLCALC 56 06/22/2017 0516    CBC    Component Value Date/Time   WBC 8.2 03/26/2018 0419   RBC 5.11 03/26/2018 0419   HGB 14.0 03/26/2018 0419   HCT 43.5 03/26/2018 0419   PLT 172 03/26/2018 0419   MCV 85.1 03/26/2018 0419   MCH 27.4 03/26/2018 0419   MCHC 32.2 03/26/2018 0419   RDW 13.2 03/26/2018 0419   LYMPHSABS 1.8 03/25/2018 1422   MONOABS 0.8 03/25/2018 1422   EOSABS 0.2 03/25/2018 1422   BASOSABS 0.1 03/25/2018 1422    Hgb A1C Lab Results  Component Value Date   HGBA1C 6.8 (A) 10/03/2018           Assessment & Plan:   HTN, HLD, Exertional Chest Pain, Exertional Shortness of Breath:  BP uncontrolled Refilled Hydralazine and advised him to take as prescribed Continue Diltiazem, Furosemide, Metoprolol and Lisinopril HCT Indication for ECG: exertional chest pain Interpretation: sinus brady, non specific T wave inversions Comparison: 03/2018- unchanged Will obtain STAT CK MB and Troponin I Refer back to cardiology for possible stress test/cath Continue Rosuvastatin, Fenofibrate, Plavix and Imdur for now  ER precautions discussed Webb Silversmith, NP

## 2018-11-12 NOTE — Progress Notes (Signed)
Carelink Summary Report / Loop Recorder 

## 2018-11-12 NOTE — Patient Instructions (Signed)
Shortness of Breath, Adult  Shortness of breath means you have trouble breathing. Shortness of breath could be a sign of a medical problem.  Follow these instructions at home:     Watch for any changes in your symptoms.   Do not use any products that contain nicotine or tobacco, such as cigarettes, e-cigarettes, and chewing tobacco.   Do not smoke. Smoking can cause shortness of breath. If you need help to quit smoking, ask your doctor.   Avoid things that can make it harder to breathe, such as:  ? Mold.  ? Dust.  ? Air pollution.  ? Chemical smells.  ? Things that can cause allergy symptoms (allergens), if you have allergies.   Keep your living space clean. Use products that help remove mold and dust.   Rest as needed. Slowly return to your normal activities.   Take over-the-counter and prescription medicines only as told by your doctor. This includes oxygen therapy and inhaled medicines.   Keep all follow-up visits as told by your doctor. This is important.  Contact a doctor if:   Your condition does not get better as soon as expected.   You have a hard time doing your normal activities, even after you rest.   You have new symptoms.  Get help right away if:   Your shortness of breath gets worse.   You have trouble breathing when you are resting.   You feel light-headed or you pass out (faint).   You have a cough that is not helped by medicines.   You cough up blood.   You have pain with breathing.   You have pain in your chest, arms, shoulders, or belly (abdomen).   You have a fever.   You cannot walk up stairs.   You cannot exercise the way you normally do.  These symptoms may represent a serious problem that is an emergency. Do not wait to see if the symptoms will go away. Get medical help right away. Call your local emergency services (911 in the [place] Do not drive yourself to the hospital.  Summary   Shortness of breath is when you have trouble breathing enough air. It can be a sign of a  medical problem.   Avoid things that make it hard for you to breathe, such as smoking, pollution, mold, and dust.   Watch for any changes in your symptoms. Contact your doctor if you do not get better or you get worse.  This information is not intended to replace advice given to you by your health care provider. Make sure you discuss any questions you have with your health care provider.  Document Released: 03/20/2008 Document Revised: 03/04/2018 Document Reviewed: 03/04/2018  Elsevier Interactive Patient Education  2019 Elsevier Inc.

## 2018-11-13 ENCOUNTER — Other Ambulatory Visit: Payer: Self-pay | Admitting: Internal Medicine

## 2018-11-13 DIAGNOSIS — I10 Essential (primary) hypertension: Secondary | ICD-10-CM

## 2018-11-13 NOTE — Progress Notes (Signed)
Electrophysiology Office Note Date: 11/14/2018  ID:  Jake Gross, DOB January 12, 1941, MRN [id number]  PCP: Jearld Fenton, NP Electrophysiologist: Allred  CC: chest pain, shortness of breath  [name] is a 78 y.o. male seen today for Dr Rayann Heman.  He presents today for evaluation of chest tightness and shortness of breath. He states that about 6 weeks ago, he was taking his trash to the road and had to stop because of shortness of breath and chest tightness. His symptoms resolved with rest. Exertional chest tightness and shortness of breath have continued since then. He has no known CAD history but has had prior CVA and is on statin, plavix, nitrate, BB with medication compliance. He denies palpitations, PND, orthopnea, nausea, vomiting, dizziness, syncope, edema, weight gain, or early satiety.  Echo 06/2017 demonstrated EF 55-60%, no RWMA.   Past Medical History:  Diagnosis Date  . 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 Arthritis   . Cervical spondylosis   . Diabetes mellitus   . Hyperlipidemia   . Hypertension   . Stroke ([place]) 06/19/2017  . Vitamin D deficiency    Past Surgical History:  Procedure Laterality Date  . ABCESS DRAINAGE Left    buttocks  . CARDIOVASCULAR STRESS TEST  10/12/1999   EF 63%. NO ISCHEMIA  . COLONOSCOPY W/ POLYPECTOMY     5 polyps  . EUS N/A 03/11/2015   Procedure: LOWER ENDOSCOPIC ULTRASOUND (EUS);  Surgeon: Milus Banister, MD;  Location: [facility];  Service: Endoscopy;  Laterality: N/A;  . FLEXIBLE SIGMOIDOSCOPY N/A 02/02/2015   Procedure: FLEXIBLE SIGMOIDOSCOPY;  Surgeon: Inda Castle, MD;  Location: [facility];  Service: Endoscopy;  Laterality: N/A;  ERBE  . LOOP RECORDER INSERTION N/A 08/14/2017   Procedure: LOOP RECORDER INSERTION;  Surgeon: Thompson Grayer, MD;  Location: [facility];  Service: Cardiovascular;  Laterality: N/A;  . PARTIAL PROCTECTOMY BY TEM N/A  04/08/2015   Procedure: TEM PARTIAL PROCTECTOMY OF RECTAL MASS;  Surgeon: Michael Boston, MD;  Location: [facility];  Service: General;  Laterality: N/A;  . TEE WITHOUT CARDIOVERSION N/A 06/25/2017   Procedure: TRANSESOPHAGEAL ECHOCARDIOGRAM (TEE);  Surgeon: Skeet Latch, MD;  Location: [facility];  Service: Cardiovascular;  Laterality: N/A;  . TONSILLECTOMY AND ADENOIDECTOMY     as child    Current Outpatient Medications  Medication Sig Dispense Refill  . CARTIA XT 240 MG 24 hr capsule TAKE 1 CAPSULE DAILY 90 capsule 0  . clopidogrel (PLAVIX) 75 MG tablet Take 1 tablet (75 mg total) by mouth daily. MUST SCHEDULE ANNUAL EXAM 90 tablet 0  . fenofibrate micronized (LOFIBRA) 67 MG capsule TAKE 1 CAPSULE DAILY BEFORE BREAKFAST 90 capsule 1  . furosemide (LASIX) 20 MG tablet Take 1 tablet (20 mg total) by mouth daily. 3 tablet 0  . hydrALAZINE (APRESOLINE) 25 MG tablet Take 1 tablet (25 mg total) by mouth 3 (three) times daily. 90 tablet 0  . Insulin Glargine (LANTUS SOLOSTAR) 100 UNIT/ML Solostar Pen Inject 100 Units into the skin every morning. 45 pen 11  . lisinopril-hydrochlorothiazide (PRINZIDE,ZESTORETIC) 20-25 MG tablet Take 1 tablet by mouth daily. MUST SCHEDULE ANNUAL EXAM 90 tablet 0  . metoprolol succinate (TOPROL-XL) 25 MG 24 hr tablet Take 1 tablet (25 mg total) by mouth daily. 90 tablet 3  . ONE TOUCH ULTRA TEST test strip USE 1 STRIP TWICE A DAY 200 each 3  . ONETOUCH DELICA LANCETS 56E MISC     .  rosuvastatin (CRESTOR) 20 MG tablet Take 1 tablet (20 mg total) by mouth at bedtime. MUST SCHEDULE ANNUAL EXAM 90 tablet 0  . isosorbide mononitrate (IMDUR) 60 MG 24 hr tablet Take 1 tablet (60 mg total) by mouth daily. 90 tablet 3   No current facility-administered medications for this visit.     Allergies:   Patient has no known allergies.   Social History: Social History   Socioeconomic History  . Marital status: Divorced    Spouse name: Not on file  . Number of children: 2  .  Years of education: Not on file  . Highest education level: Not on file  Occupational History  . Occupation: retired    Fish farm manager: [employer]  Social Needs  . Financial resource strain: Not on file  . Food insecurity:    Worry: Not on file    Inability: Not on file  . Transportation needs:    Medical: Not on file    Non-medical: Not on file  Tobacco Use  . Smoking status: Former Smoker    Types: Cigarettes    Last attempt to quit: 08/13/1994    Years since quitting: 24.2  . Smokeless tobacco: Never Used  Substance and Sexual Activity  . Alcohol use: No    Alcohol/week: 0.0 standard drinks  . Drug use: No  . Sexual activity: Not Currently  Lifestyle  . Physical activity:    Days per week: Not on file    Minutes per session: Not on file  . Stress: Not on file  Relationships  . Social connections:    Talks on phone: Not on file    Gets together: Not on file    Attends religious service: Not on file    Active member of club or organization: Not on file    Attends meetings of clubs or organizations: Not on file    Relationship status: Not on file  . Intimate partner violence:    Fear of current or ex partner: Not on file    Emotionally abused: Not on file    Physically abused: Not on file    Forced sexual activity: Not on file  Other Topics Concern  . Not on file  Social History Narrative  . Not on file    Family History: Family History  Problem Relation Age of Onset  . Cancer Father        "all over"  . Coronary artery disease Brother   . Diabetes Brother   . Stroke Mother   . Diabetes Mother   . Cancer Brother   . Colon cancer Neg Hx   . Esophageal cancer Neg Hx   . Rectal cancer Neg Hx   . Stomach cancer Neg Hx     Review of Systems: All other systems reviewed and are otherwise negative except as noted above.   Physical Exam: VS:  BP (!) 150/84   Pulse (!) 54   Ht 5\' 7"  (1.702 m)   Wt 224 lb (101.6 kg)   SpO2 97%   BMI 35.08 kg/m  , BMI Body mass index  is 35.08 kg/m. Wt Readings from Last 3 Encounters:  11/14/18 224 lb (101.6 kg)  11/12/18 224 lb (101.6 kg)  10/03/18 227 lb (103 kg)    GEN- The patient is elderly appearing, alert and oriented x 3 today.   HEENT: normocephalic, atraumatic; sclera clear, conjunctiva pink; hearing intact; oropharynx clear; neck supple  Lungs- Clear to ausculation bilaterally, normal work of breathing.  No wheezes,  rales, rhonchi Heart- Regular rate and rhythm  GI- soft, non-tender, non-distended, bowel sounds present  Extremities- no clubbing, cyanosis, or edema  MS- no significant deformity or atrophy Skin- warm and dry, no rash or lesion  Psych- euthymic mood, full affect Neuro- strength and sensation are intact   EKG:  EKG is ordered today. The ekg ordered today shows sinus bradycardia, rate 54  Recent Labs: 03/25/2018: ALT 18 03/26/2018: Hemoglobin 14.0; Platelets 172 04/24/2018: BUN 34; Creatinine, Ser 1.50; Sodium 143 05/07/2018: Potassium 5.2    Assessment and Plan:  1.  Cryptogenic stroke No AF to date on ILR Continue to monitor  2.  Chest pain/shortness of breath  Worrisome for anginal equivalent Risks, benefits to catheterization reviewed with patient who wishes to proceed. Will plan for early next week. ER precautions reviewed Discussed with Dr Angelena Form today  Increase Imdur to 60mg  daily today    Current medicines are reviewed at length with the patient today.   The patient does not have concerns regarding his medicines.  The following changes were made today:  Increase Imdur to 60mg  daily  Labs/ tests ordered today include:  Orders Placed This Encounter  Procedures  . Basic Metabolic Panel (BMET)  . CBC  . EKG 12-Lead     Disposition:   Follow up pending results of catheterization    Signed, Chanetta Marshall, NP 11/14/2018 12:53 PM   [place] [place] [address] [phone] (office) [phone] (fax)

## 2018-11-13 NOTE — H&P (View-Only) (Signed)
Electrophysiology Office Note Date: 11/14/2018  ID:  EVARISTO Gross, DOB 01-07-1941, MRN [id number]  PCP: Jearld Fenton, NP Electrophysiologist: Allred  CC: chest pain, shortness of breath  Jake Gross is a 78 y.o. male seen today for Dr Rayann Heman.  He presents today for evaluation of chest tightness and shortness of breath. He states that about 6 weeks ago, he was taking his trash to the road and had to stop because of shortness of breath and chest tightness. His symptoms resolved with rest. Exertional chest tightness and shortness of breath have continued since then. He has no known CAD history but has had prior CVA and is on statin, plavix, nitrate, BB with medication compliance. He denies palpitations, PND, orthopnea, nausea, vomiting, dizziness, syncope, edema, weight gain, or early satiety.  Echo 06/2017 demonstrated EF 55-60%, no RWMA.   Past Medical History:  Diagnosis Date  . 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 Arthritis   . Cervical spondylosis   . Diabetes mellitus   . Hyperlipidemia   . Hypertension   . Stroke (Jake Gross) 06/19/2017  . Vitamin D deficiency    Past Surgical History:  Procedure Laterality Date  . ABCESS DRAINAGE Left    buttocks  . CARDIOVASCULAR STRESS TEST  10/12/1999   EF 63%. NO ISCHEMIA  . COLONOSCOPY W/ POLYPECTOMY     5 polyps  . EUS N/A 03/11/2015   Procedure: LOWER ENDOSCOPIC ULTRASOUND (EUS);  Surgeon: Milus Banister, MD;  Location: [facility];  Service: Endoscopy;  Laterality: N/A;  . FLEXIBLE SIGMOIDOSCOPY N/A 02/02/2015   Procedure: FLEXIBLE SIGMOIDOSCOPY;  Surgeon: Inda Castle, MD;  Location: [facility];  Service: Endoscopy;  Laterality: N/A;  ERBE  . LOOP RECORDER INSERTION N/A 08/14/2017   Procedure: LOOP RECORDER INSERTION;  Surgeon: Thompson Grayer, MD;  Location: [facility];  Service: Cardiovascular;  Laterality: N/A;  . PARTIAL PROCTECTOMY BY TEM N/A  04/08/2015   Procedure: TEM PARTIAL PROCTECTOMY OF RECTAL MASS;  Surgeon: Michael Boston, MD;  Location: [facility];  Service: General;  Laterality: N/A;  . TEE WITHOUT CARDIOVERSION N/A 06/25/2017   Procedure: TRANSESOPHAGEAL ECHOCARDIOGRAM (TEE);  Surgeon: Skeet Latch, MD;  Location: [facility];  Service: Cardiovascular;  Laterality: N/A;  . TONSILLECTOMY AND ADENOIDECTOMY     as child    Current Outpatient Medications  Medication Sig Dispense Refill  . CARTIA XT 240 MG 24 hr capsule TAKE 1 CAPSULE DAILY 90 capsule 0  . clopidogrel (PLAVIX) 75 MG tablet Take 1 tablet (75 mg total) by mouth daily. MUST SCHEDULE ANNUAL EXAM 90 tablet 0  . fenofibrate micronized (LOFIBRA) 67 MG capsule TAKE 1 CAPSULE DAILY BEFORE BREAKFAST 90 capsule 1  . furosemide (LASIX) 20 MG tablet Take 1 tablet (20 mg total) by mouth daily. 3 tablet 0  . hydrALAZINE (APRESOLINE) 25 MG tablet Take 1 tablet (25 mg total) by mouth 3 (three) times daily. 90 tablet 0  . Insulin Glargine (LANTUS SOLOSTAR) 100 UNIT/ML Solostar Pen Inject 100 Units into the skin every morning. 45 pen 11  . lisinopril-hydrochlorothiazide (PRINZIDE,ZESTORETIC) 20-25 MG tablet Take 1 tablet by mouth daily. MUST SCHEDULE ANNUAL EXAM 90 tablet 0  . metoprolol succinate (TOPROL-XL) 25 MG 24 hr tablet Take 1 tablet (25 mg total) by mouth daily. 90 tablet 3  . ONE TOUCH ULTRA TEST test strip USE 1 STRIP TWICE A DAY 200 each 3  . ONETOUCH DELICA LANCETS 73A MISC     .  rosuvastatin (CRESTOR) 20 MG tablet Take 1 tablet (20 mg total) by mouth at bedtime. MUST SCHEDULE ANNUAL EXAM 90 tablet 0  . isosorbide mononitrate (IMDUR) 60 MG 24 hr tablet Take 1 tablet (60 mg total) by mouth daily. 90 tablet 3   No current facility-administered medications for this visit.     Allergies:   Patient has no known allergies.   Social History: Social History   Socioeconomic History  . Marital status: Divorced    Spouse name: Not on file  . Number of children: 2  .  Years of education: Not on file  . Highest education level: Not on file  Occupational History  . Occupation: retired    Fish farm manager: [employer]  Social Needs  . Financial resource strain: Not on file  . Food insecurity:    Worry: Not on file    Inability: Not on file  . Transportation needs:    Medical: Not on file    Non-medical: Not on file  Tobacco Use  . Smoking status: Former Smoker    Types: Cigarettes    Last attempt to quit: 08/13/1994    Years since quitting: 24.2  . Smokeless tobacco: Never Used  Substance and Sexual Activity  . Alcohol use: No    Alcohol/week: 0.0 standard drinks  . Drug use: No  . Sexual activity: Not Currently  Lifestyle  . Physical activity:    Days per week: Not on file    Minutes per session: Not on file  . Stress: Not on file  Relationships  . Social connections:    Talks on phone: Not on file    Gets together: Not on file    Attends religious service: Not on file    Active member of club or organization: Not on file    Attends meetings of clubs or organizations: Not on file    Relationship status: Not on file  . Intimate partner violence:    Fear of current or ex partner: Not on file    Emotionally abused: Not on file    Physically abused: Not on file    Forced sexual activity: Not on file  Other Topics Concern  . Not on file  Social History Narrative  . Not on file    Family History: Family History  Problem Relation Age of Onset  . Cancer Father        "all over"  . Coronary artery disease Brother   . Diabetes Brother   . Stroke Mother   . Diabetes Mother   . Cancer Brother   . Colon cancer Neg Hx   . Esophageal cancer Neg Hx   . Rectal cancer Neg Hx   . Stomach cancer Neg Hx     Review of Systems: All other systems reviewed and are otherwise negative except as noted above.   Physical Exam: VS:  BP (!) 150/84   Pulse (!) 54   Ht 5\' 7"  (1.702 m)   Wt 224 lb (101.6 kg)   SpO2 97%   BMI 35.08 kg/m  , BMI Body mass index  is 35.08 kg/m. Wt Readings from Last 3 Encounters:  11/14/18 224 lb (101.6 kg)  11/12/18 224 lb (101.6 kg)  10/03/18 227 lb (103 kg)    GEN- The patient is elderly appearing, alert and oriented x 3 today.   HEENT: normocephalic, atraumatic; sclera clear, conjunctiva pink; hearing intact; oropharynx clear; neck supple  Lungs- Clear to ausculation bilaterally, normal work of breathing.  No wheezes,  rales, rhonchi Heart- Regular rate and rhythm  GI- soft, non-tender, non-distended, bowel sounds present  Extremities- no clubbing, cyanosis, or edema  MS- no significant deformity or atrophy Skin- warm and dry, no rash or lesion  Psych- euthymic mood, full affect Neuro- strength and sensation are intact   EKG:  EKG is ordered today. The ekg ordered today shows sinus bradycardia, rate 54  Recent Labs: 03/25/2018: ALT 18 03/26/2018: Hemoglobin 14.0; Platelets 172 04/24/2018: BUN 34; Creatinine, Ser 1.50; Sodium 143 05/07/2018: Potassium 5.2    Assessment and Plan:  1.  Cryptogenic stroke No AF to date on ILR Continue to monitor  2.  Chest pain/shortness of breath  Worrisome for anginal equivalent Risks, benefits to catheterization reviewed with patient who wishes to proceed. Will plan for early next week. ER precautions reviewed Discussed with Dr Angelena Form today  Increase Imdur to 60mg  daily today    Current medicines are reviewed at length with the patient today.   The patient does not have concerns regarding his medicines.  The following changes were made today:  Increase Imdur to 60mg  daily  Labs/ tests ordered today include:  Orders Placed This Encounter  Procedures  . Basic Metabolic Panel (BMET)  . CBC  . EKG 12-Lead     Disposition:   Follow up pending results of catheterization    Signed, Chanetta Marshall, NP 11/14/2018 12:53 PM   [place] [address] [phone] (office) [phone] (fax)

## 2018-11-14 ENCOUNTER — Ambulatory Visit (INDEPENDENT_AMBULATORY_CARE_PROVIDER_SITE_OTHER): Payer: Medicare Other | Admitting: Nurse Practitioner

## 2018-11-14 ENCOUNTER — Encounter: Payer: Self-pay | Admitting: Nurse Practitioner

## 2018-11-14 VITALS — BP 150/84 | HR 54 | Ht 67.0 in | Wt 224.0 lb

## 2018-11-14 DIAGNOSIS — I639 Cerebral infarction, unspecified: Secondary | ICD-10-CM

## 2018-11-14 DIAGNOSIS — Z01812 Encounter for preprocedural laboratory examination: Secondary | ICD-10-CM

## 2018-11-14 DIAGNOSIS — R079 Chest pain, unspecified: Secondary | ICD-10-CM | POA: Diagnosis not present

## 2018-11-14 MED ORDER — ISOSORBIDE MONONITRATE ER 60 MG PO TB24: 60.0000 mg | ORAL_TABLET | Freq: Every day | ORAL | 3 refills | Status: DC

## 2018-11-14 NOTE — Patient Instructions (Signed)
Medication Instructions: INCREASE IMDUR TO 60 mg DAILY   If you need a refill on your cardiac medications before your next appointment, please call your pharmacy.   Lab work:TODAY BMET CBC If you have labs (blood work) drawn today and your tests are completely normal, you will receive your results only by: Marland Kitchen MyChart Message (if you have MyChart) OR . A paper copy in the mail If you have any lab test that is abnormal or we need to change your treatment, we will call you to review the results.  Testing/Procedures: Your physician has requested that you have a cardiac catheterization. Cardiac catheterization is used to diagnose and/or treat various heart conditions. Doctors may recommend this procedure for a number of different reasons. The most common reason is to evaluate chest pain. Chest pain can be a symptom of coronary artery disease (CAD), and cardiac catheterization can show whether plaque is narrowing or blocking your heart's arteries. This procedure is also used to evaluate the valves, as well as measure the blood flow and oxygen levels in different parts of your heart. For further information please visit [email or web address]. Please follow instruction sheet, as given.    Follow-Up: At [facility], you and your health needs are our priority.  As part of our continuing mission to provide you with exceptional heart care, we have created designated Provider Care Teams.  These Care Teams include your primary Cardiologist (physician) and Advanced Practice Providers (APPs -  Physician Assistants and Nurse Practitioners) who all work together to provide you with the care you need, when you need it. .   Any Other Special Instructions Will Be Listed Below (If Applicable).    [facility] OFFICE [place], [place] [address] Dept: [phone] Loc: [place]  11/14/2018  You are scheduled for a Cardiac Catheterization on Tuesday, February 4 with Dr. Sherren Mocha.  1. Please arrive at the [facility] (Main Entrance A) at [facility]: [address] [place], [place] [address] at 5:30 AM (This time is two hours before your procedure to ensure your preparation). Free valet parking service is available.   Special note: Every effort is made to have your procedure done on time. Please understand that emergencies sometimes delay scheduled procedures.  2. Diet: Do not eat solid foods after midnight.  The patient may have clear liquids until 5am upon the day of the procedure.  4. Medication instructions in preparation for your procedure: DO NOT TAKE INSULIN OR LASIX MORNING OF YOUR PROCEDURE       On the morning of your procedure, take your Plavix/Clopidogrel and any morning medicines NOT listed above.  You may use sips of water.  5. Plan for one night stay--bring personal belongings. 6. Bring a current list of your medications and current insurance cards. 7. You MUST have a responsible person to drive you home. 8. Someone MUST be with you the first 24 hours after you arrive home or your discharge will be delayed. 9. Please wear clothes that are easy to get on and off and wear slip-on shoes.  Thank you for allowing [place] to care for you!   -- [facility]

## 2018-11-15 ENCOUNTER — Telehealth: Payer: Self-pay

## 2018-11-15 LAB — BASIC METABOLIC PANEL
BUN/Creatinine Ratio: 14 (ref 10–24)
BUN: 26 mg/dL (ref 8–27)
CALCIUM: 9.1 mg/dL (ref 8.6–10.2)
CO2: 20 mmol/L (ref 20–29)
Chloride: 110 mmol/L — ABNORMAL HIGH (ref 96–106)
Creatinine, Ser: 1.81 mg/dL — ABNORMAL HIGH (ref 0.76–1.27)
GFR calc non Af Amer: 36 mL/min/{1.73_m2} — ABNORMAL LOW (ref >59)
GFR, EST AFRICAN AMERICAN: 41 mL/min/{1.73_m2} — AB (ref >59)
Glucose: 141 mg/dL — ABNORMAL HIGH (ref 65–99)
Potassium: 4.4 mmol/L (ref 3.5–5.2)
Sodium: 147 mmol/L — ABNORMAL HIGH (ref 134–144)

## 2018-11-15 LAB — CBC
Hematocrit: 41 % (ref 37.5–51.0)
Hemoglobin: 13.9 g/dL (ref 13.0–17.7)
MCH: 27.6 pg (ref 26.6–33.0)
MCHC: 33.9 g/dL (ref 31.5–35.7)
MCV: 82 fL (ref 79–97)
Platelets: 261 10*3/uL (ref 150–450)
RBC: 5.03 x10E6/uL (ref 4.14–5.80)
RDW: 13.7 % (ref 11.6–15.4)
WBC: 9.3 10*3/uL (ref 3.4–10.8)

## 2018-11-15 MED ORDER — ISOSORBIDE MONONITRATE ER 60 MG PO TB24: 60.0000 mg | ORAL_TABLET | Freq: Every day | ORAL | 3 refills | Status: DC

## 2018-11-15 NOTE — Telephone Encounter (Signed)
-----   Message from Patsey Berthold, NP sent at 11/15/2018  2:54 PM EST ----- Please notify patient of lab results. Creatinine elevated. For this weekend, hold Lisinopril and Lasix. He will need to arrive to hospital 5 hours prior to cath for hydration. Thank you!

## 2018-11-15 NOTE — Telephone Encounter (Signed)
Notes recorded by Frederik Schmidt, RN on 11/15/2018 at 4:05 PM EST The patient has been notified of the result and verbalized understanding. All questions (if any) were answered. Frederik Schmidt, RN 11/15/2018 4:05 PM   ------

## 2018-11-19 ENCOUNTER — Other Ambulatory Visit: Payer: Self-pay | Admitting: *Deleted

## 2018-11-19 ENCOUNTER — Telehealth: Payer: Self-pay | Admitting: *Deleted

## 2018-11-19 NOTE — Telephone Encounter (Signed)
Pt contacted pre-catheterization scheduled at [facility] for: Wednesday November 20, 2018 2:30 PM Verified arrival time and place: [facility] Entrance A at: 9:30 AM for pre procedure hydration  No solid food after midnight prior to cath, clear liquids until 5 AM day of procedure.  Hold: Insulin-AM of procedure. Lisinopril-hydrochlorothiazide until post procedure.  Lasix-pt states not taking.  Except hold medications AM meds can be  taken pre-cath with sip of water including: ASA 81 mg Plavix 75 mg   Confirmed patient has responsible person to drive home post procedure and observe 24 hours after arriving home: yes  I encourage patient to drink water today for hydration pre procedure.

## 2018-11-19 NOTE — Patient Outreach (Addendum)
Jake Gross) Care Management  11/19/2018   Jake Gross 01/12/1941 [id number]   [name] telephone call to patient.  [place] compliance verified. Per patient his fasting blood sugar is 125 today. Patient stated that his fasting blood sugars usually run 90-100. He stated that he has had a few episodes of 140's. His A1C has decreased to 6.8. per patient he has been having some chest discomfort and pain in both arms and back. Patient is scheduled for cardiac cath on 11/20/2018. Patient has agreed to follow up outreach calls.  Current Medications:  Current Outpatient Medications  Medication Sig Dispense Refill  . clopidogrel (PLAVIX) 75 MG tablet Take 1 tablet (75 mg total) by mouth daily. MUST SCHEDULE ANNUAL EXAM 90 tablet 0  . diltiazem (CARDIZEM CD) 240 MG 24 hr capsule TAKE 1 CAPSULE DAILY (Patient taking differently: Take 240 mg by mouth daily. ) 90 capsule 0  . fenofibrate micronized (LOFIBRA) 67 MG capsule TAKE 1 CAPSULE DAILY BEFORE BREAKFAST (Patient taking differently: Take 67 mg by mouth daily. ) 90 capsule 1  . furosemide (LASIX) 20 MG tablet Take 1 tablet (20 mg total) by mouth daily. (Patient not taking: Reported on 11/14/2018) 3 tablet 0  . hydrALAZINE (APRESOLINE) 25 MG tablet Take 1 tablet (25 mg total) by mouth 3 (three) times daily. 90 tablet 0  . Insulin Glargine (LANTUS SOLOSTAR) 100 UNIT/ML Solostar Pen Inject 100 Units into the skin every morning. 45 pen 11  . isosorbide mononitrate (IMDUR) 60 MG 24 hr tablet Take 1 tablet (60 mg total) by mouth daily. 90 tablet 3  . lisinopril-hydrochlorothiazide (PRINZIDE,ZESTORETIC) 20-25 MG tablet Take 1 tablet by mouth daily. MUST SCHEDULE ANNUAL EXAM 90 tablet 0  . metoprolol succinate (TOPROL-XL) 25 MG 24 hr tablet Take 1 tablet (25 mg total) by mouth daily. 90 tablet 3  . ONE TOUCH ULTRA TEST test strip USE 1 STRIP TWICE A DAY 200 each 3  . ONETOUCH DELICA LANCETS 84O MISC     . rosuvastatin (CRESTOR) 20 MG  tablet Take 1 tablet (20 mg total) by mouth at bedtime. MUST SCHEDULE ANNUAL EXAM 90 tablet 0   No current facility-administered medications for this visit.     Functional Status:  In your present state of health, do you have any difficulty performing the following activities: 11/19/2018 07/16/2018  Hearing? N N  Vision? N N  Difficulty concentrating or making decisions? N N  Walking or climbing stairs? N N  Dressing or bathing? N N  Doing errands, shopping? N N  Preparing Food and eating ? N N  Using the Toilet? N N  In the past six months, have you accidently leaked urine? Y Y  Do you have problems with loss of bowel control? N N  Managing your Medications? N N  Managing your Finances? N N  Housekeeping or managing your Housekeeping? N N  Some recent data might be hidden    Fall/Depression Screening: Fall Risk  11/19/2018 08/07/2018 07/16/2018  Falls in the past year? 1 Yes Yes  Number falls in past yr: 0 1 1  Injury with Fall? 0 Yes No  Risk for fall due to : History of fall(s);Impaired balance/gait;Impaired mobility - History of fall(s);Impaired balance/gait;Impaired mobility  Follow up Falls evaluation completed - Falls evaluation completed;Falls prevention discussed;Education provided   [name] 2/9 Scores 11/19/2018 07/16/2018 12/19/2017 12/05/2017 11/16/2017 09/13/2017 07/20/2017  PHQ - 2 Score 0 0 0 0 0 0 0   THN CM Care Plan  Problem One     Most Recent Value  Care Plan Problem One  Knowledge Deficit in Self Management of Diabetes  Role Documenting the Problem One  [name] for Problem One  Active  THN Long Term Goal   Patient will report following up withhealth maintenance within thenext 90 days  Interventions for Problem One Long Term Goal  Rn discussed health maintenance. Patient is having cardiac cath [id number] due to chest pain. RN will follow up with further discussion  THN CM Short Term Goal #1   Patient will report checking blood sugar and documenting as per Dr  order within the next 30 days  THN CM Short Term Goal #2   Patient fasting blood sugar will be 130 or less within the next 30 days  THN CM Short Term Goal #2 Start Date  11/19/18  Interventions for Short Term Goal #2  RN discussed with patient what his blood sugars have been running and congratulations on the decrease of A1C to 6.8. Patient is trying hard and making progress. Keep up the good work and Therapist, sports will continue following for support  THN CM Short Term Goal #3  Patient will start walking for exercise within the next 30 days  THN CM Short Term Goal #3 Start Date  11/19/18  Interventions for Short Tern Goal #3  Patient will place activity on hold until cardiac catherization on 11/19/2018 and find out outcome. RN will follow closely with patient.       Assessment:  Fasting blood sugar is 132 A1C is 6.8 Patient is having some chest discomfort, arm and back discomfort with activity Patient went to cardiologist Plan:  Patient aware if symptoms change or get worse go to the [facility] Cardiac Catheterization is scheduled for 11/20/2018 RN reiterated the pre cath instructions RN discussed monitoring blood sugar RN discussed hypertension RN will follow up within the month of March  Jake Gross [place] Care Management [phone]

## 2018-11-20 ENCOUNTER — Other Ambulatory Visit: Payer: Self-pay

## 2018-11-20 ENCOUNTER — Other Ambulatory Visit: Payer: Self-pay | Admitting: *Deleted

## 2018-11-20 ENCOUNTER — Inpatient Hospital Stay (HOSPITAL_COMMUNITY)
Admission: RE | Admit: 2018-11-20 | Discharge: 2018-12-02 | DRG: 234 | Disposition: A | Discharge status: Home Health | Payer: Medicare Other | Attending: Surgery | Admitting: Surgery

## 2018-11-20 ENCOUNTER — Encounter (HOSPITAL_COMMUNITY): Payer: Self-pay

## 2018-11-20 ENCOUNTER — Encounter (HOSPITAL_COMMUNITY)
Admission: RE | Disposition: A | Discharge status: Home Health | Payer: Self-pay | Source: Home / Self Care | Attending: Cardiovascular Disease

## 2018-11-20 DIAGNOSIS — I200 Unstable angina: Secondary | ICD-10-CM

## 2018-11-20 DIAGNOSIS — R079 Chest pain, unspecified: Secondary | ICD-10-CM | POA: Diagnosis not present

## 2018-11-20 DIAGNOSIS — I2584 Coronary atherosclerosis due to calcified coronary lesion: Secondary | ICD-10-CM | POA: Diagnosis present

## 2018-11-20 DIAGNOSIS — Z8719 Personal history of other diseases of the digestive system: Secondary | ICD-10-CM | POA: Diagnosis not present

## 2018-11-20 DIAGNOSIS — I519 Heart disease, unspecified: Secondary | ICD-10-CM | POA: Diagnosis not present

## 2018-11-20 DIAGNOSIS — Z01810 Encounter for preprocedural cardiovascular examination: Secondary | ICD-10-CM | POA: Diagnosis not present

## 2018-11-20 DIAGNOSIS — E785 Hyperlipidemia, unspecified: Secondary | ICD-10-CM | POA: Diagnosis present

## 2018-11-20 DIAGNOSIS — J9811 Atelectasis: Secondary | ICD-10-CM | POA: Diagnosis not present

## 2018-11-20 DIAGNOSIS — R001 Bradycardia, unspecified: Secondary | ICD-10-CM | POA: Diagnosis present

## 2018-11-20 DIAGNOSIS — I340 Nonrheumatic mitral (valve) insufficiency: Secondary | ICD-10-CM | POA: Diagnosis not present

## 2018-11-20 DIAGNOSIS — M1990 Unspecified osteoarthritis, unspecified site: Secondary | ICD-10-CM | POA: Diagnosis present

## 2018-11-20 DIAGNOSIS — T508X5A Adverse effect of diagnostic agents, initial encounter: Secondary | ICD-10-CM | POA: Diagnosis not present

## 2018-11-20 DIAGNOSIS — Z951 Presence of aortocoronary bypass graft: Secondary | ICD-10-CM | POA: Diagnosis not present

## 2018-11-20 DIAGNOSIS — E1122 Type 2 diabetes mellitus with diabetic chronic kidney disease: Secondary | ICD-10-CM | POA: Diagnosis present

## 2018-11-20 DIAGNOSIS — Z7982 Long term (current) use of aspirin: Secondary | ICD-10-CM

## 2018-11-20 DIAGNOSIS — I440 Atrioventricular block, first degree: Secondary | ICD-10-CM | POA: Diagnosis present

## 2018-11-20 DIAGNOSIS — I129 Hypertensive chronic kidney disease with stage 1 through stage 4 chronic kidney disease, or unspecified chronic kidney disease: Secondary | ICD-10-CM | POA: Diagnosis present

## 2018-11-20 DIAGNOSIS — I2510 Atherosclerotic heart disease of native coronary artery without angina pectoris: Secondary | ICD-10-CM | POA: Diagnosis not present

## 2018-11-20 DIAGNOSIS — Z8249 Family history of ischemic heart disease and other diseases of the circulatory system: Secondary | ICD-10-CM

## 2018-11-20 DIAGNOSIS — Z794 Long term (current) use of insulin: Secondary | ICD-10-CM

## 2018-11-20 DIAGNOSIS — Z9049 Acquired absence of other specified parts of digestive tract: Secondary | ICD-10-CM

## 2018-11-20 DIAGNOSIS — Z823 Family history of stroke: Secondary | ICD-10-CM

## 2018-11-20 DIAGNOSIS — I25110 Atherosclerotic heart disease of native coronary artery with unstable angina pectoris: Principal | ICD-10-CM | POA: Diagnosis present

## 2018-11-20 DIAGNOSIS — D62 Acute posthemorrhagic anemia: Secondary | ICD-10-CM | POA: Diagnosis present

## 2018-11-20 DIAGNOSIS — J90 Pleural effusion, not elsewhere classified: Secondary | ICD-10-CM | POA: Diagnosis not present

## 2018-11-20 DIAGNOSIS — M47812 Spondylosis without myelopathy or radiculopathy, cervical region: Secondary | ICD-10-CM | POA: Diagnosis present

## 2018-11-20 DIAGNOSIS — N179 Acute kidney failure, unspecified: Secondary | ICD-10-CM | POA: Diagnosis present

## 2018-11-20 DIAGNOSIS — Z7902 Long term (current) use of antithrombotics/antiplatelets: Secondary | ICD-10-CM

## 2018-11-20 DIAGNOSIS — I69351 Hemiplegia and hemiparesis following cerebral infarction affecting right dominant side: Secondary | ICD-10-CM

## 2018-11-20 DIAGNOSIS — Z79899 Other long term (current) drug therapy: Secondary | ICD-10-CM | POA: Diagnosis not present

## 2018-11-20 DIAGNOSIS — Z4682 Encounter for fitting and adjustment of non-vascular catheter: Secondary | ICD-10-CM | POA: Diagnosis not present

## 2018-11-20 DIAGNOSIS — E8770 Fluid overload, unspecified: Secondary | ICD-10-CM | POA: Diagnosis not present

## 2018-11-20 DIAGNOSIS — Z87891 Personal history of nicotine dependence: Secondary | ICD-10-CM

## 2018-11-20 DIAGNOSIS — N141 Nephropathy induced by other drugs, medicaments and biological substances: Secondary | ICD-10-CM | POA: Diagnosis not present

## 2018-11-20 DIAGNOSIS — Z833 Family history of diabetes mellitus: Secondary | ICD-10-CM | POA: Diagnosis not present

## 2018-11-20 DIAGNOSIS — N183 Chronic kidney disease, stage 3 (moderate): Secondary | ICD-10-CM | POA: Diagnosis present

## 2018-11-20 DIAGNOSIS — Z09 Encounter for follow-up examination after completed treatment for conditions other than malignant neoplasm: Secondary | ICD-10-CM

## 2018-11-20 DIAGNOSIS — I10 Essential (primary) hypertension: Secondary | ICD-10-CM | POA: Diagnosis not present

## 2018-11-20 DIAGNOSIS — R0602 Shortness of breath: Secondary | ICD-10-CM | POA: Diagnosis not present

## 2018-11-20 HISTORY — PX: LEFT HEART CATH AND CORONARY ANGIOGRAPHY: CATH118249

## 2018-11-20 LAB — GLUCOSE, CAPILLARY
GLUCOSE-CAPILLARY: 91 mg/dL (ref 70–99)
Glucose-Capillary: 72 mg/dL (ref 70–99)

## 2018-11-20 LAB — BASIC METABOLIC PANEL
Anion gap: 8 (ref 5–15)
BUN: 19 mg/dL (ref 8–23)
CO2: 20 mmol/L — ABNORMAL LOW (ref 22–32)
CREATININE: 1.51 mg/dL — AB (ref 0.61–1.24)
Calcium: 8.4 mg/dL — ABNORMAL LOW (ref 8.9–10.3)
Chloride: 114 mmol/L — ABNORMAL HIGH (ref 98–111)
GFR calc Af Amer: 52 mL/min — ABNORMAL LOW (ref >60)
GFR, EST NON AFRICAN AMERICAN: 45 mL/min — AB (ref >60)
GLUCOSE: 74 mg/dL (ref 70–99)
Potassium: 3.8 mmol/L (ref 3.5–5.1)
Sodium: 142 mmol/L (ref 135–145)

## 2018-11-20 SURGERY — LEFT HEART CATH AND CORONARY ANGIOGRAPHY
Anesthesia: LOCAL

## 2018-11-20 MED ORDER — HEPARIN (PORCINE) IN NACL 1000-0.9 UT/500ML-% IV SOLN
INTRAVENOUS | Status: DC | PRN
  Administered 2018-11-20: 500 mL

## 2018-11-20 MED ORDER — ACETAMINOPHEN 325 MG PO TABS: 650.0000 mg | ORAL_TABLET | ORAL | Status: DC | PRN

## 2018-11-20 MED ORDER — HEPARIN SODIUM (PORCINE) 1000 UNIT/ML IJ SOLN
INTRAMUSCULAR | Status: AC
  Filled 2018-11-20: qty 1

## 2018-11-20 MED ORDER — HYDRALAZINE HCL 20 MG/ML IJ SOLN
10.0000 mg | INTRAMUSCULAR | Status: DC | PRN
  Administered 2018-11-25: 10 mg via INTRAVENOUS
  Filled 2018-11-20 (×2): qty 1

## 2018-11-20 MED ORDER — MIDAZOLAM HCL 2 MG/2ML IJ SOLN
INTRAMUSCULAR | Status: DC | PRN
  Administered 2018-11-20: 1 mg via INTRAVENOUS

## 2018-11-20 MED ORDER — INSULIN GLARGINE 100 UNIT/ML ~~LOC~~ SOLN
100.0000 [IU] | Freq: Every day | SUBCUTANEOUS | Status: DC
  Administered 2018-11-21 – 2018-11-22 (×2): 100 [IU] via SUBCUTANEOUS
  Filled 2018-11-20 (×3): qty 1

## 2018-11-20 MED ORDER — FENTANYL CITRATE (PF) 100 MCG/2ML IJ SOLN
INTRAMUSCULAR | Status: DC | PRN
  Administered 2018-11-20: 25 ug via INTRAVENOUS

## 2018-11-20 MED ORDER — ONDANSETRON HCL 4 MG/2ML IJ SOLN: 4.0000 mg | Freq: Four times a day (QID) | INTRAMUSCULAR | Status: DC | PRN

## 2018-11-20 MED ORDER — SODIUM CHLORIDE 0.9 % IV SOLN: 250.0000 mL | INTRAVENOUS | Status: DC | PRN

## 2018-11-20 MED ORDER — SODIUM CHLORIDE 0.9% FLUSH
3.0000 mL | Freq: Two times a day (BID) | INTRAVENOUS | Status: DC
  Administered 2018-11-20 – 2018-11-25 (×9): 3 mL via INTRAVENOUS

## 2018-11-20 MED ORDER — VERAPAMIL HCL 2.5 MG/ML IV SOLN
INTRAVENOUS | Status: DC | PRN
  Administered 2018-11-20: 10 mL via INTRA_ARTERIAL

## 2018-11-20 MED ORDER — HYDRALAZINE HCL 20 MG/ML IJ SOLN
INTRAMUSCULAR | Status: DC | PRN
  Administered 2018-11-20: 10 mg via INTRAVENOUS

## 2018-11-20 MED ORDER — ISOSORBIDE MONONITRATE ER 60 MG PO TB24
60.0000 mg | ORAL_TABLET | Freq: Every day | ORAL | Status: DC
  Administered 2018-11-21 – 2018-11-25 (×5): 60 mg via ORAL
  Filled 2018-11-20 (×5): qty 1

## 2018-11-20 MED ORDER — HYDRALAZINE HCL 25 MG PO TABS
25.0000 mg | ORAL_TABLET | Freq: Three times a day (TID) | ORAL | Status: DC
  Administered 2018-11-20 – 2018-11-22 (×8): 25 mg via ORAL
  Filled 2018-11-20 (×8): qty 1

## 2018-11-20 MED ORDER — ROSUVASTATIN CALCIUM 20 MG PO TABS
20.0000 mg | ORAL_TABLET | Freq: Every day | ORAL | Status: DC
  Administered 2018-11-20: 20 mg via ORAL
  Filled 2018-11-20: qty 1

## 2018-11-20 MED ORDER — FENTANYL CITRATE (PF) 100 MCG/2ML IJ SOLN
INTRAMUSCULAR | Status: AC
  Filled 2018-11-20: qty 2

## 2018-11-20 MED ORDER — SODIUM CHLORIDE 0.9% FLUSH: 3.0000 mL | INTRAVENOUS | Status: DC | PRN

## 2018-11-20 MED ORDER — CLOPIDOGREL BISULFATE 75 MG PO TABS: 75.0000 mg | ORAL_TABLET | ORAL | Status: DC

## 2018-11-20 MED ORDER — HEPARIN SODIUM (PORCINE) 1000 UNIT/ML IJ SOLN
INTRAMUSCULAR | Status: DC | PRN
  Administered 2018-11-20: 5000 [IU] via INTRAVENOUS

## 2018-11-20 MED ORDER — HEPARIN (PORCINE) IN NACL 1000-0.9 UT/500ML-% IV SOLN
INTRAVENOUS | Status: AC
  Filled 2018-11-20: qty 1000

## 2018-11-20 MED ORDER — HYDRALAZINE HCL 20 MG/ML IJ SOLN
INTRAMUSCULAR | Status: AC
  Filled 2018-11-20: qty 1

## 2018-11-20 MED ORDER — METOPROLOL SUCCINATE ER 25 MG PO TB24: 25.0000 mg | ORAL_TABLET | Freq: Every day | ORAL | Status: DC

## 2018-11-20 MED ORDER — HYDRALAZINE HCL 20 MG/ML IJ SOLN
10.0000 mg | Freq: Once | INTRAMUSCULAR | Status: AC
  Administered 2018-11-20: 10 mg via INTRAVENOUS

## 2018-11-20 MED ORDER — LIDOCAINE HCL (PF) 1 % IJ SOLN
INTRAMUSCULAR | Status: AC
  Filled 2018-11-20: qty 30

## 2018-11-20 MED ORDER — MIDAZOLAM HCL 2 MG/2ML IJ SOLN
INTRAMUSCULAR | Status: AC
  Filled 2018-11-20: qty 2

## 2018-11-20 MED ORDER — FUROSEMIDE 10 MG/ML IJ SOLN
40.0000 mg | Freq: Once | INTRAMUSCULAR | Status: AC
  Administered 2018-11-20: 40 mg via INTRAVENOUS
  Filled 2018-11-20: qty 4

## 2018-11-20 MED ORDER — LISINOPRIL-HYDROCHLOROTHIAZIDE 20-25 MG PO TABS: 1.0000 | ORAL_TABLET | Freq: Every day | ORAL | Status: DC

## 2018-11-20 MED ORDER — SODIUM CHLORIDE 0.9 % IV SOLN
INTRAVENOUS | Status: DC
  Administered 2018-11-20: 20:00:00 via INTRAVENOUS

## 2018-11-20 MED ORDER — HYDROCHLOROTHIAZIDE 25 MG PO TABS
25.0000 mg | ORAL_TABLET | Freq: Every day | ORAL | Status: DC
  Administered 2018-11-21: 25 mg via ORAL
  Filled 2018-11-20: qty 1

## 2018-11-20 MED ORDER — DILTIAZEM HCL ER COATED BEADS 240 MG PO CP24: 240.0000 mg | ORAL_CAPSULE | Freq: Every day | ORAL | Status: DC

## 2018-11-20 MED ORDER — ASPIRIN 81 MG PO CHEW: 81.0000 mg | CHEWABLE_TABLET | ORAL | Status: DC

## 2018-11-20 MED ORDER — SODIUM CHLORIDE 0.9 % WEIGHT BASED INFUSION: 1.0000 mL/kg/h | INTRAVENOUS | Status: DC

## 2018-11-20 MED ORDER — LIDOCAINE HCL (PF) 1 % IJ SOLN
INTRAMUSCULAR | Status: DC | PRN
  Administered 2018-11-20: 2 mL

## 2018-11-20 MED ORDER — SODIUM CHLORIDE 0.9 % WEIGHT BASED INFUSION
3.0000 mL/kg/h | INTRAVENOUS | Status: DC
  Administered 2018-11-20: 3 mL/kg/h via INTRAVENOUS

## 2018-11-20 MED ORDER — VERAPAMIL HCL 2.5 MG/ML IV SOLN
INTRAVENOUS | Status: AC
  Filled 2018-11-20: qty 2

## 2018-11-20 MED ORDER — IOHEXOL 350 MG/ML SOLN
INTRAVENOUS | Status: DC | PRN
  Administered 2018-11-20: 135 mL via INTRA_ARTERIAL

## 2018-11-20 MED ORDER — SODIUM CHLORIDE 0.9% FLUSH: 3.0000 mL | Freq: Two times a day (BID) | INTRAVENOUS | Status: DC

## 2018-11-20 MED ORDER — ASPIRIN 81 MG PO CHEW
81.0000 mg | CHEWABLE_TABLET | Freq: Every day | ORAL | Status: DC
  Administered 2018-11-21 – 2018-11-25 (×5): 81 mg via ORAL
  Filled 2018-11-20 (×5): qty 1

## 2018-11-20 MED ORDER — LISINOPRIL 20 MG PO TABS
20.0000 mg | ORAL_TABLET | Freq: Every day | ORAL | Status: DC
  Administered 2018-11-21: 20 mg via ORAL
  Filled 2018-11-20: qty 1

## 2018-11-20 SURGICAL SUPPLY — 11 items
CATH 5FR JL3.5 JR4 ANG PIG MP (CATHETERS) ×1 IMPLANT
CATH INFINITI 5FR AL1 (CATHETERS) ×1 IMPLANT
CATH LAUNCHER 5F EBU3.5 (CATHETERS) ×1 IMPLANT
DEVICE RAD COMP TR BAND LRG (VASCULAR PRODUCTS) ×1 IMPLANT
GLIDESHEATH SLEND SS 6F .021 (SHEATH) ×1 IMPLANT
GUIDEWIRE INQWIRE 1.5J.035X260 (WIRE) IMPLANT
INQWIRE 1.5J .035X260CM (WIRE) ×2
KIT HEART LEFT (KITS) ×2 IMPLANT
PACK CARDIAC CATHETERIZATION (CUSTOM PROCEDURE TRAY) ×2 IMPLANT
TRANSDUCER W/STOPCOCK (MISCELLANEOUS) ×2 IMPLANT
TUBING CIL FLEX 10 FLL-RA (TUBING) ×2 IMPLANT

## 2018-11-20 NOTE — Interval H&P Note (Signed)
History and Physical Interval Note:  11/20/2018 1:05 PM  [place]  has presented today for surgery, with the diagnosis of unstable angina. The various methods of treatment have been discussed with the patient and family. After consideration of risks, benefits and other options for treatment, the patient has consented to  Procedure(s): LEFT HEART CATH AND CORONARY ANGIOGRAPHY (N/A) as a surgical intervention .  The patient's history has been reviewed, patient examined, no change in status, stable for surgery.  I have reviewed the patient's chart and labs.  Questions were answered to the patient's satisfaction.    Cath Lab Visit (complete for each Cath Lab visit)  Clinical Evaluation Leading to the Procedure:   ACS: No.  Non-ACS:    Anginal Classification: CCS III  Anti-ischemic medical therapy: Maximal Therapy (2 or more classes of medications)  Non-Invasive Test Results: No non-invasive testing performed  Prior CABG: No previous CABG        Lauree Chandler

## 2018-11-20 NOTE — Progress Notes (Signed)
Attempted to release air from TR band, after initial 3cc the area oozed and the 3cc reinserted, no further bleeding, pt instructed to try to relax and keep arm stilll, bp  Still eleavted, he was given iv hydralazine in cath lab, paged Sharolyn Douglas NP to advise

## 2018-11-20 NOTE — Progress Notes (Signed)
TCTS consulted for CABG evaluation. °

## 2018-11-21 ENCOUNTER — Inpatient Hospital Stay (HOSPITAL_COMMUNITY): Payer: Medicare Other

## 2018-11-21 ENCOUNTER — Encounter (HOSPITAL_COMMUNITY): Payer: Self-pay | Admitting: Cardiovascular Disease

## 2018-11-21 ENCOUNTER — Other Ambulatory Visit (HOSPITAL_COMMUNITY): Payer: Medicare Other

## 2018-11-21 DIAGNOSIS — R079 Chest pain, unspecified: Secondary | ICD-10-CM

## 2018-11-21 DIAGNOSIS — Z01810 Encounter for preprocedural cardiovascular examination: Secondary | ICD-10-CM

## 2018-11-21 DIAGNOSIS — I25110 Atherosclerotic heart disease of native coronary artery with unstable angina pectoris: Secondary | ICD-10-CM

## 2018-11-21 DIAGNOSIS — I10 Essential (primary) hypertension: Secondary | ICD-10-CM

## 2018-11-21 LAB — PULMONARY FUNCTION TEST
FEF 25-75 Pre: 1.84 L/sec
FEF2575-%Pred-Pre: 94 %
FEV1-%Pred-Pre: 80 %
FEV1-Pre: 2.16 L
FEV1FVC-%Pred-Pre: 105 %
FEV6-%Pred-Pre: 80 %
FEV6-Pre: 2.82 L
FEV6FVC-%Pred-Pre: 107 %
FVC-%Pred-Pre: 75 %
FVC-Pre: 2.82 L
PRE FEV1/FVC RATIO: 77 %
Pre FEV6/FVC Ratio: 100 %

## 2018-11-21 LAB — BASIC METABOLIC PANEL
Anion gap: 12 (ref 5–15)
BUN: 17 mg/dL (ref 8–23)
CO2: 23 mmol/L (ref 22–32)
Calcium: 8.9 mg/dL (ref 8.9–10.3)
Chloride: 108 mmol/L (ref 98–111)
Creatinine, Ser: 1.61 mg/dL — ABNORMAL HIGH (ref 0.61–1.24)
GFR calc Af Amer: 48 mL/min — ABNORMAL LOW (ref >60)
GFR, EST NON AFRICAN AMERICAN: 41 mL/min — AB (ref >60)
Glucose, Bld: 91 mg/dL (ref 70–99)
Potassium: 3.8 mmol/L (ref 3.5–5.1)
Sodium: 143 mmol/L (ref 135–145)

## 2018-11-21 LAB — CBC
HCT: 40.4 % (ref 39.0–52.0)
Hemoglobin: 12.9 g/dL — ABNORMAL LOW (ref 13.0–17.0)
MCH: 27 pg (ref 26.0–34.0)
MCHC: 31.9 g/dL (ref 30.0–36.0)
MCV: 84.5 fL (ref 80.0–100.0)
NRBC: 0 % (ref 0.0–0.2)
PLATELETS: 198 10*3/uL (ref 150–400)
RBC: 4.78 MIL/uL (ref 4.22–5.81)
RDW: 14.4 % (ref 11.5–15.5)
WBC: 7.7 10*3/uL (ref 4.0–10.5)

## 2018-11-21 LAB — GLUCOSE, CAPILLARY
Glucose-Capillary: 93 mg/dL (ref 70–99)
Glucose-Capillary: 188 mg/dL — ABNORMAL HIGH (ref 70–99)
Glucose-Capillary: 219 mg/dL — ABNORMAL HIGH (ref 70–99)
Glucose-Capillary: 156 mg/dL — ABNORMAL HIGH (ref 70–99)

## 2018-11-21 LAB — ECHOCARDIOGRAM COMPLETE
Height: 67 in
Weight: 3584 oz

## 2018-11-21 MED ORDER — ROSUVASTATIN CALCIUM 20 MG PO TABS
40.0000 mg | ORAL_TABLET | Freq: Every day | ORAL | Status: DC
  Administered 2018-11-21 – 2018-12-01 (×11): 40 mg via ORAL
  Filled 2018-11-21 (×11): qty 2

## 2018-11-21 MED ORDER — HEPARIN SODIUM (PORCINE) 5000 UNIT/ML IJ SOLN
5000.0000 [IU] | Freq: Three times a day (TID) | INTRAMUSCULAR | Status: DC
  Administered 2018-11-21 – 2018-11-26 (×15): 5000 [IU] via SUBCUTANEOUS
  Filled 2018-11-21 (×15): qty 1

## 2018-11-21 MED ORDER — POTASSIUM CHLORIDE CRYS ER 20 MEQ PO TBCR
40.0000 meq | EXTENDED_RELEASE_TABLET | Freq: Once | ORAL | Status: AC
  Administered 2018-11-21: 40 meq via ORAL
  Filled 2018-11-21: qty 2

## 2018-11-21 MED ORDER — CARVEDILOL 6.25 MG PO TABS
6.2500 mg | ORAL_TABLET | Freq: Two times a day (BID) | ORAL | Status: DC
  Administered 2018-11-21 (×2): 6.25 mg via ORAL
  Filled 2018-11-21 (×2): qty 1

## 2018-11-21 MED ORDER — CARVEDILOL 6.25 MG PO TABS
6.2500 mg | ORAL_TABLET | Freq: Two times a day (BID) | ORAL | Status: DC
  Administered 2018-11-22 (×2): 6.25 mg via ORAL
  Filled 2018-11-21 (×2): qty 1

## 2018-11-21 NOTE — Progress Notes (Signed)
Pre-CABG testing has been completed. Preliminary results can be found in CV Proc through chart review.   11/21/18 2:38 PM Jake Gross RVT

## 2018-11-21 NOTE — Consult Note (Signed)
[address]       [place] [address]             [phone]        [place]is S [place] Medical Record #[id number] Date of Birth: [birth year]  Referring: Jetta Lout, MD Primary Care: Jearld Fenton, NP EP Cardiologist: Allred  Chief Complaint:   Exertional chest pressure and shortness of breath.  History of Present Illness: Jake Gross is a 78 year old male with history of type 2 diabetes mellitus of 25 years duration, hypertension, dyslipidemia, arthritis, chronic kidney disease, and adenocarcinoma within a rectal polyp, status post partial colectomy.  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  He recently sought medical attention when he experienced dyspnea and chest pressure that occurred while he was taking trash out at his home.  His symptoms resolved with rest.  After being seen by Dr. Angelena Form and his staff, the decision was made to proceed with left heart catheterization electively.  This study, performed on 11/20/2018, demonstrated severe multivessel coronary artery disease including a 70% ostial left main stenosis.  A left ventriculogram was not performed but LVEDP was elevated.  We have been asked to evaluate Jake Gross for consideration of coronary bypass grafting.   As mentioned above, Jake Gross experienced a CVA in September 2018.  Work-up at the time including CT of the brain, MRI of the brain, MRA of the brain, and carotid ultrasound.  None of these studies demonstrated any significant vascular disease.  He went on to have echocardiography including transthoracic echo and TEE.  Neither of these studies demonstrated any intramural clot.  They did show good LV function with ejection fraction of 55-60 and some LV hypertrophy.  There was no significant valvular disease identified.  He went on to have a loop recording device inserted and this is been  interrogated monthly over the past 11 months showing no evidence of atrial fibrillation.  He has been taking Plavix daily since his CVA.  His last dose was 11/20/2018.  Jake Gross denies having any chest discomfort or dyspnea since his admission to the hospital yesterday    Current Activity/ Functional Status: Patient is independent with mobility/ambulation, transfers, ADL's, IADL's.   Zubrod Score: At the time of surgery this patient's most appropriate activity status/level should be described as: []     0    Normal activity, no symptoms [x]     1    Restricted in physical strenuous activity but ambulatory, able to do out light work []     2    Ambulatory and capable of self care, unable to do work activities, up and about                 more than 50%  Of the time                            []     3    Only limited self care, in bed greater than 50% of waking hours []     4    Completely disabled, no self care, confined to bed or chair []     5    Moribund  Past Medical History:  Diagnosis Date  . 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 Arthritis   .  Cervical spondylosis   . Diabetes mellitus   . Hyperlipidemia   . Hypertension   . Stroke ([place]) 06/19/2017  . Vitamin D deficiency     Past Surgical History:  Procedure Laterality Date  . ABCESS DRAINAGE Left    buttocks  . CARDIOVASCULAR STRESS TEST  10/12/1999   EF 63%. NO ISCHEMIA  . COLONOSCOPY W/ POLYPECTOMY     5 polyps  . EUS N/A 03/11/2015   Procedure: LOWER ENDOSCOPIC ULTRASOUND (EUS);  Surgeon: Milus Banister, MD;  Location: [facility];  Service: Endoscopy;  Laterality: N/A;  . FLEXIBLE SIGMOIDOSCOPY N/A 02/02/2015   Procedure: FLEXIBLE SIGMOIDOSCOPY;  Surgeon: Inda Castle, MD;  Location: [facility];  Service: Endoscopy;  Laterality: N/A;  ERBE  . LEFT HEART CATH AND CORONARY ANGIOGRAPHY N/A 11/20/2018   Procedure: LEFT HEART CATH AND CORONARY ANGIOGRAPHY;  Surgeon:  Burnell Blanks, MD;  Location: [facility];  Service: Cardiovascular;  Laterality: N/A;  . LOOP RECORDER INSERTION N/A 08/14/2017   Procedure: LOOP RECORDER INSERTION;  Surgeon: Thompson Grayer, MD;  Location: [facility];  Service: Cardiovascular;  Laterality: N/A;  . PARTIAL PROCTECTOMY BY TEM N/A 04/08/2015   Procedure: TEM PARTIAL PROCTECTOMY OF RECTAL MASS;  Surgeon: Michael Boston, MD;  Location: [facility];  Service: General;  Laterality: N/A;  . TEE WITHOUT CARDIOVERSION N/A 06/25/2017   Procedure: TRANSESOPHAGEAL ECHOCARDIOGRAM (TEE);  Surgeon: Skeet Latch, MD;  Location: [facility];  Service: Cardiovascular;  Laterality: N/A;  . TONSILLECTOMY AND ADENOIDECTOMY     as child    Social History   Tobacco Use  Smoking Status Former Smoker  . Types: Cigarettes  . Last attempt to quit: 08/13/1994  . Years since quitting: 24.2  Smokeless Tobacco Never Used    Social History   Substance and Sexual Activity  Alcohol Use No  . Alcohol/week: 0.0 standard drinks     No Known Allergies  Current Facility-Administered Medications  Medication Dose Route Frequency Provider Last Rate Last Dose  . 0.9 %  sodium chloride infusion  250 mL Intravenous PRN Burnell Blanks, MD      . acetaminophen (TYLENOL) tablet 650 mg  650 mg Oral Q4H PRN Burnell Blanks, MD      . aspirin chewable tablet 81 mg  81 mg Oral Daily Burnell Blanks, MD   81 mg at 11/21/18 0948  . carvedilol (COREG) tablet 6.25 mg  6.25 mg Oral BID WC Bhagat, Bhavinkumar, PA   6.25 mg at 11/21/18 0954  . heparin injection 5,000 Units  5,000 Units Subcutaneous Q8H Bhagat, Bhavinkumar, PA      . hydrALAZINE (APRESOLINE) injection 10 mg  10 mg Intravenous Q4H PRN Burnell Blanks, MD      . hydrALAZINE (APRESOLINE) tablet 25 mg  25 mg Oral TID Burnell Blanks, MD   25 mg at 11/21/18 0948  . lisinopril (PRINIVIL,ZESTRIL) tablet 20 mg  20 mg Oral Daily Skeet Simmer, RPH    20 mg at 11/21/18 7341   And  . hydrochlorothiazide (HYDRODIURIL) tablet 25 mg  25 mg Oral Daily Skeet Simmer, RPH   25 mg at 11/21/18 9379  . insulin glargine (LANTUS) injection 100 Units  100 Units Subcutaneous Daily Burnell Blanks, MD   100 Units at 11/21/18 [phone]  . isosorbide mononitrate (IMDUR) 24 hr tablet 60 mg  60 mg Oral Daily Burnell Blanks, MD   60 mg at 11/21/18 0947  . ondansetron (ZOFRAN) injection 4  mg  4 mg Intravenous Q6H PRN Burnell Blanks, MD      . rosuvastatin (CRESTOR) tablet 40 mg  40 mg Oral QHS Bhagat, Bhavinkumar, PA      . sodium chloride flush (NS) 0.9 % injection 3 mL  3 mL Intravenous Q12H Burnell Blanks, MD   3 mL at 11/20/18 2135  . sodium chloride flush (NS) 0.9 % injection 3 mL  3 mL Intravenous PRN Burnell Blanks, MD        Medications Prior to Admission  Medication Sig Dispense Refill Last Dose  . clopidogrel (PLAVIX) 75 MG tablet Take 1 tablet (75 mg total) by mouth daily. MUST SCHEDULE ANNUAL EXAM 90 tablet 0 11/20/2018 at 0630  . diltiazem (CARDIZEM CD) 240 MG 24 hr capsule TAKE 1 CAPSULE DAILY (Patient taking differently: Take 240 mg by mouth daily. ) 90 capsule 0 11/20/2018 at 0630  . fenofibrate micronized (LOFIBRA) 67 MG capsule TAKE 1 CAPSULE DAILY BEFORE BREAKFAST (Patient taking differently: Take 67 mg by mouth daily. ) 90 capsule 1 11/20/2018 at 0630  . hydrALAZINE (APRESOLINE) 25 MG tablet Take 1 tablet (25 mg total) by mouth 3 (three) times daily. 90 tablet 0 11/20/2018 at 0630  . Insulin Glargine (LANTUS SOLOSTAR) 100 UNIT/ML Solostar Pen Inject 100 Units into the skin every morning. 45 pen 11 11/19/2018 at Unknown time  . isosorbide mononitrate (IMDUR) 60 MG 24 hr tablet Take 1 tablet (60 mg total) by mouth daily. 90 tablet 3 11/20/2018 at 0630  . lisinopril-hydrochlorothiazide (PRINZIDE,ZESTORETIC) 20-25 MG tablet Take 1 tablet by mouth daily. MUST SCHEDULE ANNUAL EXAM 90 tablet 0 11/15/2018  . metoprolol  succinate (TOPROL-XL) 25 MG 24 hr tablet Take 1 tablet (25 mg total) by mouth daily. 90 tablet 3 11/20/2018 at 0630  . rosuvastatin (CRESTOR) 20 MG tablet Take 1 tablet (20 mg total) by mouth at bedtime. MUST SCHEDULE ANNUAL EXAM 90 tablet 0 11/19/2018 at Unknown time  . furosemide (LASIX) 20 MG tablet Take 1 tablet (20 mg total) by mouth daily. (Patient not taking: Reported on 11/14/2018) 3 tablet 0 Not Taking at Unknown time  . ONE TOUCH ULTRA TEST test strip USE 1 STRIP TWICE A DAY 200 each 3 n/a  . ONETOUCH DELICA LANCETS 93G MISC    n/a    Family History  Problem Relation Age of Onset  . Cancer Father        "all over"  . Coronary artery disease Brother   . Diabetes Brother   . Stroke Mother   . Diabetes Mother   . Cancer Brother   . Colon cancer Neg Hx   . Esophageal cancer Neg Hx   . Rectal cancer Neg Hx   . Stomach cancer Neg Hx      Review of Systems:   ROS Constitutional: negative, No recent weight loss. Eyes: No visual changes. Respiratory: negative, No cough of chest pain. Cardiovascular: As per HPI Gastrointestinal: negative Hematologic/lymphatic: negative, no h/o bleeding disorders. Neurological: negative, reports he has made a full recovey since his CVA with no residual deficits.     Cardiac Review of Systems: Y or  [    ]= no  Chest Pain [    ]  Resting SOB [   ] Exertional SOB  [ Y ]  Orthopnea [  ]   Pedal Edema [   ]    Palpitations [  ] Syncope  [  ]   Presyncope [   ]  General  Review of Systems: [Y] = yes [  ]=no Constitional: recent weight change [  ]; anorexia [  ]; fatigue [  ]; nausea [  ]; night sweats [  ]; fever [  ]; or chills [  ]                                                               Dental: Last Dentist visit:   Eye : blurred vision [  ]; diplopia [   ]; vision changes [  ];  Amaurosis fugax[  ]; Resp: cough [  ];  wheezing[  ];  hemoptysis[  ]; shortness of breath[  ]; paroxysmal nocturnal dyspnea[  ]; dyspnea on exertion[  ]; or orthopnea[   ];  GI:  gallstones[  ], vomiting[  ];  dysphagia[  ]; melena[  ];  hematochezia [  ]; heartburn[  ];   Hx of  Colonoscopy[  ]; GU: kidney stones [  ]; hematuria[  ];   dysuria [  ];  nocturia[  ];  history of     obstruction [  ]; urinary frequency [  ]             Skin: rash, swelling[  ];, hair loss[  ];  peripheral edema[  ];  or itching[  ]; Musculosketetal: myalgias[  ];  joint swelling[  ];  joint erythema[  ];  joint pain[  ];  back pain[  ];  Heme/Lymph: bruising[  ];  bleeding[  ];  anemia[  ];  Neuro: TIA[  ];  headaches[  ];  stroke[  ];  vertigo[  ];  seizures[  ];   paresthesias[  ];  difficulty walking[  ];  Psych:depression[  ]; anxiety[  ];  Endocrine: diabetes[  ];  thyroid dysfunction[  ];              Physical Exam: BP (!) 155/71   Pulse 75   Temp 98.2 F (36.8 C) (Oral)   Resp 20   Ht 5\' 7"  (1.702 m)   Wt 101.6 kg   SpO2 94%   BMI 35.08 kg/m    General appearance: alert, cooperative and no distress Head: Normocephalic, without obvious abnormality, atraumatic Neck: no adenopathy, no carotid bruit, no JVD, supple, symmetrical, trachea midline and thyroid not enlarged, symmetric, no tenderness/mass/nodules Lymph nodes: Cervical, supraclavicular, and axillary nodes normal. Resp: clear to auscultation bilaterally Cardio: regular rate and rhythm, S1, S2 normal, no murmur, click, rub or gallop GI: soft, non-tender; bowel sounds normal; no masses,  no organomegaly Extremities: extremities normal, atraumatic, no cyanosis or edema Neurologic: Alert and oriented X 3, normal strength and tone. Normal symmetric reflexes. Normal coordination and gait  Diagnostic Studies & Laboratory data:   Diagnostic  Dominance: Right  Left Main  Ost LM lesion 70% stenosed  Ost LM lesion is 70% stenosed. The lesion is eccentric.  Left Anterior Descending  Prox LAD lesion 95% stenosed  Prox LAD lesion is 95% stenosed. The lesion is calcified.  First Diagonal Branch  Vessel is  moderate in size.  Ost 1st Diag lesion 80% stenosed  Ost 1st Diag lesion is 80% stenosed.  Second Diagonal Branch  Vessel is moderate in size.  Ost 2nd Diag to 2nd Diag lesion 99% stenosed  Ost 2nd Diag to 2nd Diag lesion is 99% stenosed.  Left Circumflex  Vessel is moderate in size.  Ost Cx to Prox Cx lesion 70% stenosed  Ost Cx to Prox Cx lesion is 70% stenosed.  First Obtuse Marginal Branch  Vessel is moderate in size.  Right Coronary Artery  Vessel is large.  Prox RCA to Mid RCA lesion 30% stenosed  Prox RCA to Mid RCA lesion is 30% stenosed.  Right Posterior Descending Artery  RPDA lesion 99% stenosed  RPDA lesion is 99% stenosed.  Right Posterior Atrioventricular Branch  Collaterals  Post Atrio filled by collaterals from 1st Sept.    Post Atrio lesion 100% stenosed  Post Atrio lesion is 100% stenosed. The lesion is chronically occluded.  Intervention   No interventions have been documented.  Coronary Diagrams   Diagnostic  Dominance: Right    Intervention   Implants    No implant documentation for     Recent Radiology Findings:   No results found.   I have independently reviewed the above radiologic studies and discussed with the patient   Recent Lab Findings: Lab Results  Component Value Date   WBC 7.7 11/21/2018   HGB 12.9 (L) 11/21/2018   HCT 40.4 11/21/2018   PLT 198 11/21/2018   GLUCOSE 91 11/21/2018   CHOL 138 11/12/2018   TRIG 214.0 (H) 11/12/2018   HDL 25.60 (L) 11/12/2018   LDLDIRECT 80.0 11/12/2018   LDLCALC 56 06/22/2017   ALT 18 03/25/2018   AST 21 03/25/2018   NA 143 11/21/2018   K 3.8 11/21/2018   CL 108 11/21/2018   CREATININE 1.61 (H) 11/21/2018   BUN 17 11/21/2018   CO2 23 11/21/2018   INR 1.02 06/19/2017   HGBA1C 6.8 (A) 10/03/2018      Assessment / Plan:      1.  A 78 year old male with multiple medical problems listed above was recently evaluated for exertional chest pressure and shortness of breath.  Left heart  catheterization yesterday demonstrates multivessel coronary artery disease with an ostial 70% left main coronary artery stenosis.  Left ventriculogram was not performed but LVEDP was elevated.  Echocardiography obtained about a year and half ago demonstrated normal ejection fraction with no wall motion abnormalities and mild MR.  Repeat echo has been ordered for tomorrow.  He has been taking Plavix for approximately 2 years for management of his cerebrovascular disease.  Last dose was 11/20/2018.  He is currently asymptomatic at rest.  Coronary bypass grafting is indicated and offered to the patient.  He would like to proceed and understands that a 5-day Plavix washout is required in order to minimize potential perioperative bleeding complications.  Will initiate preoperative evaluation including carotid Dopplers and screening for peripheral arterial disease.  2.  Chronic kidney disease, baseline creatinine prior to left heart catheterization was 1.81 and baseline GFR was 36.  3. Hypertension managed with multiple agents including lisinopril.  Will plan to discontinue lisinopril 48 hours prior to surgery to minimize further risk to his kidney function.  4 Type 2 Diabetes Mellitus-currently managed with long-acting insulin. His last HgbA1C on 10/03/18 was 6.8.  5. Dyslipidemia.  Continue Crestor  I  spent 20 minutes counseling the patient face to face.   Antony Odea, PA-C 11/21/2018 10:35 AM

## 2018-11-21 NOTE — Progress Notes (Signed)
  Echocardiogram 2D Echocardiogram has been performed.  Jake Gross 11/21/2018, 12:05 PM

## 2018-11-21 NOTE — Consult Note (Signed)
   [facility] CM Inpatient Consult   11/21/2018  [place] 1941/01/18 [phone]  Patient is currently active with [facility] for chronic disease management services.  Patient has been engaged by a [facility] community based plan of care has focused on disease management and community resource support.  Patient will receive a post hospital call and will be evaluated for assessments and disease process education.  Made Inpatient Case Manager aware that [facility] following in progression meeting.   Patient in for cardiac catheterization on 11/20/2018.   Came by to see patient but he was being transported off the unit for test. Will follow for disposition and needs.  Of note, [facility] Care Management services does not replace or interfere with any services that are needed or arranged by inpatient case management or social work.  For additional questions or referrals please contact:   Natividad Brood, RN BSN [facility] Liaison  [phone] business mobile phone Toll free office [phone]

## 2018-11-21 NOTE — Progress Notes (Signed)
Respiratory Note:  Beside Spirometry FVC maneuvers x 3 performed. Dr. Cyndia Bent viewed results; Dr Cyndia Bent said not to perform SVC maneuvers or post-bronchodilator FVC maneuvers

## 2018-11-21 NOTE — Progress Notes (Signed)
Progress Note  Patient Name: Jake Gross Date of Encounter: 11/21/2018  Primary Cardiologist: New  EP: Dr. Rayann Heman   Subjective   Feeling well. No chest pain, sob or palpitations.   Inpatient Medications    Scheduled Meds: . aspirin  81 mg Oral Daily  . diltiazem  240 mg Oral Daily  . hydrALAZINE  25 mg Oral TID  . lisinopril  20 mg Oral Daily   And  . hydrochlorothiazide  25 mg Oral Daily  . insulin glargine  100 Units Subcutaneous Daily  . isosorbide mononitrate  60 mg Oral Daily  . metoprolol succinate  25 mg Oral Daily  . rosuvastatin  20 mg Oral QHS  . sodium chloride flush  3 mL Intravenous Q12H   Continuous Infusions: . sodium chloride     PRN Meds: sodium chloride, acetaminophen, hydrALAZINE, ondansetron (ZOFRAN) IV, sodium chloride flush   Vital Signs    Vitals:   11/20/18 1932 11/20/18 2002 11/21/18 0108 11/21/18 0514  BP: (!) 172/73 (!) 156/65 (!) 136/56 (!) 155/71  Pulse: 60 (!) 56 77 75  Resp:  18 20   Temp:  (!) 97.4 F (36.3 C) 98.5 F (36.9 C) 98.2 F (36.8 C)  TempSrc:  Oral Oral Oral  SpO2: 97% 97% 96% 94%  Weight:      Height:        Intake/Output Summary (Last 24 hours) at 11/21/2018 0922 Last data filed at 11/21/2018 0962 Gross per 24 hour  Intake 1343.76 ml  Output 2050 ml  Net -706.24 ml   Last 3 Weights 11/20/2018 11/14/2018 11/12/2018  Weight (lbs) 224 lb 224 lb 224 lb  Weight (kg) 101.606 kg 101.606 kg 101.606 kg      Telemetry    SR at rate of 50-60s - Personally Reviewed  ECG    N/A  Physical Exam   GEN: Obese male in no acute distress.   Neck: No JVD Cardiac: RRR, no murmurs, rubs, or gallops. Right radial cath site without hematoma.  Respiratory: Clear to auscultation bilaterally. GI: Soft, nontender, non-distended  MS: No edema; No deformity. Neuro:  Nonfocal  Psych: Normal affect   Labs    Chemistry Recent Labs  Lab 11/14/18 1211 11/20/18 1300 11/21/18 0502  NA 147* 142 143  K 4.4 3.8 3.8  CL  110* 114* 108  CO2 20 20* 23  GLUCOSE 141* 74 91  BUN 26 19 17   CREATININE 1.81* 1.51* 1.61*  CALCIUM 9.1 8.4* 8.9  GFRNONAA 36* 45* 41*  GFRAA 41* 52* 48*  ANIONGAP  --  8 12     Hematology Recent Labs  Lab 11/14/18 1211 11/21/18 0502  WBC 9.3 7.7  RBC 5.03 4.78  HGB 13.9 12.9*  HCT 41.0 40.4  MCV 82 84.5  MCH 27.6 27.0  MCHC 33.9 31.9  RDW 13.7 14.4  PLT 261 198    Radiology    No results found.  Cardiac Studies   LEFT HEART CATH AND CORONARY ANGIOGRAPHY  Conclusion     Prox RCA to Mid RCA lesion is 30% stenosed.  RPDA lesion is 99% stenosed.  Post Atrio lesion is 100% stenosed.  Ost 2nd Diag to 2nd Diag lesion is 99% stenosed.  Prox LAD lesion is 95% stenosed.  Ost 1st Diag lesion is 80% stenosed.  Ost Cx to Prox Cx lesion is 70% stenosed.  Ost LM lesion is 70% stenosed.   1. Moderately severe ostial left main stenosis.  2. Severe stenosis in the  heavily calcified mid LAD 3. Moderately severe proximal Circumflex stenosis 4. The RCA has mild calcific disease through the proximal and mid segments. The PDA is a moderate caliber vessel with severe stenosis. 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  5. 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 I will ask CT surgery to see him to discuss CABG. Given chronic kidney disease, I will admit him for hydration. Will hold Plavix. He will need Plavix washout prior to surgery. Will arrange echo tomorrow to assess LVEF. Will start ASA. Continue beta blocker and statin. Given elevated filling pressures, one dose of IV Lasix tonight.      Patient Profile     78 y.o. male with hx of cryptogenic stroke s/p ILR (no afib up to date). 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   Assessment & Plan    1. CAD - Cath showed severe left main and three vessel CAD as  above. Elevated LVEDP. Pending echo and surgical evaluation for CABG.   2. CKD stage III - Seems baseline Scr 1.3-1.5. Given elevated LVEDP, given IV lasix 40mg  x 1. Watch renal function closely while on Lisinopril/HCTZ.   3. HTN - given elevated BP, baseline bradycardia and CAD, will simplify his medications.  - Stop Toprol and Cardizem. Start Coreg.  - otherwise, continue other medication until echo resulted.   4. HLD - 11/12/2018: Cholesterol 138; HDL 25.60; Triglycerides 214.0; VLDL 42.8  - Will increase Crestor to 40mg  daily.   5.DM - A1c 6.8 09/2018. On Insulin.   For questions or updates, please contact [place] Please consult [email or web address] for contact info under        Signed, [employer], PA  11/21/2018, 9:22 AM

## 2018-11-22 ENCOUNTER — Encounter (HOSPITAL_COMMUNITY): Payer: Medicare Other

## 2018-11-22 DIAGNOSIS — N179 Acute kidney failure, unspecified: Secondary | ICD-10-CM

## 2018-11-22 DIAGNOSIS — I200 Unstable angina: Secondary | ICD-10-CM

## 2018-11-22 DIAGNOSIS — I519 Heart disease, unspecified: Secondary | ICD-10-CM

## 2018-11-22 DIAGNOSIS — E785 Hyperlipidemia, unspecified: Secondary | ICD-10-CM

## 2018-11-22 LAB — BASIC METABOLIC PANEL
Anion gap: 11 (ref 5–15)
BUN: 29 mg/dL — ABNORMAL HIGH (ref 8–23)
CO2: 23 mmol/L (ref 22–32)
Calcium: 8.8 mg/dL — ABNORMAL LOW (ref 8.9–10.3)
Chloride: 108 mmol/L (ref 98–111)
Creatinine, Ser: 3.02 mg/dL — ABNORMAL HIGH (ref 0.61–1.24)
GFR calc Af Amer: 22 mL/min — ABNORMAL LOW (ref >60)
GFR calc non Af Amer: 19 mL/min — ABNORMAL LOW (ref >60)
Glucose, Bld: 97 mg/dL (ref 70–99)
Potassium: 3.8 mmol/L (ref 3.5–5.1)
Sodium: 142 mmol/L (ref 135–145)

## 2018-11-22 LAB — GLUCOSE, CAPILLARY
GLUCOSE-CAPILLARY: 105 mg/dL — AB (ref 70–99)
Glucose-Capillary: 97 mg/dL (ref 70–99)
Glucose-Capillary: 145 mg/dL — ABNORMAL HIGH (ref 70–99)
Glucose-Capillary: 115 mg/dL — ABNORMAL HIGH (ref 70–99)

## 2018-11-22 MED ORDER — SODIUM CHLORIDE 0.9 % IV SOLN
INTRAVENOUS | Status: DC
  Administered 2018-11-22: 16:00:00 via INTRAVENOUS

## 2018-11-22 NOTE — Progress Notes (Signed)
2 Days Post-Op Procedure(s) (LRB): LEFT HEART CATH AND CORONARY ANGIOGRAPHY (N/A) Subjective:  No chest pain or shortness of breath.  Objective: Vital signs in last 24 hours: Temp:  [98.3 F (36.8 C)-98.9 F (37.2 C)] 98.6 F (37 C) (02/07 1225) Pulse Rate:  [58-68] 60 (02/07 1225) Cardiac Rhythm: Sinus bradycardia (02/07 0700) Resp:  [18-20] 20 (02/07 1225) BP: (135-163)/(58-97) 135/58 (02/07 1225) SpO2:  [95 %-96 %] 96 % (02/07 1225) Weight:  [98.9 kg] 98.9 kg (02/07 0424)  Hemodynamic parameters for last 24 hours:    Intake/Output from previous day: 02/06 0701 - 02/07 0700 In: 240 [P.O.:240] Out: 500 [Urine:500] Intake/Output this shift: Total I/O In: 240 [P.O.:240] Out: 700 [Urine:700]  General appearance: alert and cooperative Heart: regular rate and rhythm, S1, S2 normal, no murmur, click, rub or gallop Lungs: clear to auscultation bilaterally  Lab Results: Recent Labs    11/21/18 0502  WBC 7.7  HGB 12.9*  HCT 40.4  PLT 198   BMET:  Recent Labs    11/21/18 0502 11/22/18 0453  NA 143 142  K 3.8 3.8  CL 108 108  CO2 23 23  GLUCOSE 91 97  BUN 17 29*  CREATININE 1.61* 3.02*  CALCIUM 8.9 8.8*    PT/INR: No results for input(s): LABPROT, INR in the last 72 hours. ABG    Component Value Date/Time   TCO2 24 06/19/2017 2322   CBG (last 3)  Recent Labs    11/21/18 1623 11/21/18 2132 11/22/18 0718  GLUCAP 219* 156* 97    Assessment/Plan: S/P Procedure(s) (LRB): LEFT HEART CATH AND CORONARY ANGIOGRAPHY (N/A)  Left main and severe multi-vessel CAD. Creatinine increased from 1.6 yesterday am to 3.02 this am, likely due to contrast, ACE and diuretic. Expect this to improve over next few days. I will plan to do CABG Tuesday as long as creat is coming back down to baseline.    LOS: 2 days    Gaye Pollack 11/22/2018

## 2018-11-22 NOTE — Care Management Important Message (Signed)
Important Message  Patient Details  Name: Jake Gross MRN: [id number] Date of Birth: [birth year]   Medicare Important Message Given:  Yes    Suki Crockett P [place] 11/22/2018, 1:23 PM

## 2018-11-22 NOTE — Progress Notes (Addendum)
Progress Note  Patient Name: Jake Gross Date of Encounter: 11/22/2018  Primary Cardiologist: New to Dr. Julianne Handler EP: Dr. Rayann Heman  Subjective   No chest pain or shortness of breath.  Inpatient Medications    Scheduled Meds: . aspirin  81 mg Oral Daily  . carvedilol  6.25 mg Oral BID WC  . heparin injection (subcutaneous)  5,000 Units Subcutaneous Q8H  . hydrALAZINE  25 mg Oral TID  . insulin glargine  100 Units Subcutaneous Daily  . isosorbide mononitrate  60 mg Oral Daily  . rosuvastatin  40 mg Oral QHS  . sodium chloride flush  3 mL Intravenous Q12H   Continuous Infusions: . sodium chloride     PRN Meds: sodium chloride, acetaminophen, hydrALAZINE, ondansetron (ZOFRAN) IV, sodium chloride flush   Vital Signs    Vitals:   11/21/18 1637 11/21/18 1929 11/22/18 0010 11/22/18 0424  BP: (!) 150/66 138/67 (!) 163/71 (!) 149/97  Pulse: 68 61 (!) 58 61  Resp:  20 18 18   Temp:  98.8 F (37.1 C) 98.9 F (37.2 C) 98.3 F (36.8 C)  TempSrc:  Oral Oral Oral  SpO2:  95% 96% 95%  Weight:    98.9 kg  Height:        Intake/Output Summary (Last 24 hours) at 11/22/2018 0828 Last data filed at 11/22/2018 0510 Gross per 24 hour  Intake 240 ml  Output 500 ml  Net -260 ml   Last 3 Weights 11/22/2018 11/20/2018 11/14/2018  Weight (lbs) 218 lb 224 lb 224 lb  Weight (kg) 98.884 kg 101.606 kg 101.606 kg      Telemetry    Sinus bradycardia in 50s- Personally Reviewed  ECG    Not applicable  Physical Exam   GEN: No acute distress.   Neck: No JVD Cardiac: RRR, no murmurs, rubs, or gallops.  No hematoma or ecchymosis at right radial cath site. Respiratory: Clear to auscultation bilaterally. GI: Soft, nontender, non-distended  MS: No edema; No deformity. Neuro:  Nonfocal  Psych: Normal affect   Labs    Chemistry Recent Labs  Lab 11/20/18 1300 11/21/18 0502 11/22/18 0453  NA 142 143 142  K 3.8 3.8 3.8  CL 114* 108 108  CO2 20* 23 23  GLUCOSE 74 91 97  BUN 19  17 29*  CREATININE 1.51* 1.61* 3.02*  CALCIUM 8.4* 8.9 8.8*  GFRNONAA 45* 41* 19*  GFRAA 52* 48* 22*  ANIONGAP 8 12 11      Hematology Recent Labs  Lab 11/21/18 0502  WBC 7.7  RBC 4.78  HGB 12.9*  HCT 40.4  MCV 84.5  MCH 27.0  MCHC 31.9  RDW 14.4  PLT 198    Cardiac EnzymesNo results for input(s): TROPONINI in the last 168 hours. No results for input(s): TROPIPOC in the last 168 hours.   BNPNo results for input(s): BNP, PROBNP in the last 168 hours.   DDimer No results for input(s): DDIMER in the last 168 hours.   Radiology    Vas US Doppler Pre Cabg  Result Date: 11/21/2018 PREOPERATIVE VASCULAR EVALUATION  Indications: Pre-CABG. Performing Technologist: Carlos Levering RVT  Examination Guidelines: A complete evaluation includes B-mode imaging, spectral Doppler, color Doppler, and power Doppler as needed of all accessible portions of each vessel. Bilateral testing is considered an integral part of a complete examination. Limited examinations for reoccurring indications may be performed as noted.  Right Carotid Findings: +----------+--------+--------+--------+-----------------------+--------+           PSV cm/sEDV cm/sStenosisDescribe  Comments +----------+--------+--------+--------+-----------------------+--------+ CCA Prox  72      14              smooth and heterogenous         +----------+--------+--------+--------+-----------------------+--------+ CCA Distal63      13              smooth and heterogenous         +----------+--------+--------+--------+-----------------------+--------+ ICA Prox  76      22              smooth and heterogenous         +----------+--------+--------+--------+-----------------------+--------+ ICA Distal68      22                                              +----------+--------+--------+--------+-----------------------+--------+ ECA       159     11                                               +----------+--------+--------+--------+-----------------------+--------+ Portions of this table do not appear on this page. +----------+--------+-------+--------+------------+           PSV cm/sEDV cmsDescribeArm Pressure +----------+--------+-------+--------+------------+ Subclavian117                    151          +----------+--------+-------+--------+------------+ +---------+--------+--+--------+--+---------+ VertebralPSV cm/s40EDV cm/s12Antegrade +---------+--------+--+--------+--+---------+ Left Carotid Findings: +----------+--------+-------+--------+--------------------------------+--------+           PSV cm/sEDV    StenosisDescribe                        Comments                   cm/s                                                    +----------+--------+-------+--------+--------------------------------+--------+ CCA Prox  68      14             smooth and heterogenous                  +----------+--------+-------+--------+--------------------------------+--------+ CCA Distal77      17             smooth and heterogenous                  +----------+--------+-------+--------+--------------------------------+--------+ ICA Prox  75      30             smooth, heterogenous and                                                  calcific                                 +----------+--------+-------+--------+--------------------------------+--------+ ICA Distal227     78  tortuous +----------+--------+-------+--------+--------------------------------+--------+ ECA       140                                                             +----------+--------+-------+--------+--------------------------------+--------+ +----------+--------+--------+--------+------------+ SubclavianPSV cm/sEDV cm/sDescribeArm Pressure +----------+--------+--------+--------+------------+           183                      163          +----------+--------+--------+--------+------------+ +---------+--------+--+--------+--+---------+ VertebralPSV cm/s61EDV cm/s15Antegrade +---------+--------+--+--------+--+---------+  ABI Findings: +--------+------------------+-----+---------+--------+ Right   Rt Pressure (mmHg)IndexWaveform Comment  +--------+------------------+-----+---------+--------+ [id number]                    triphasic         +--------+------------------+-----+---------+--------+ PTA     101               0.62 biphasic          +--------+------------------+-----+---------+--------+ DP      100               0.61 biphasic          +--------+------------------+-----+---------+--------+ +--------+------------------+-----+---------+-------+ Left    Lt Pressure (mmHg)IndexWaveform Comment +--------+------------------+-----+---------+-------+ [id number]                    triphasic        +--------+------------------+-----+---------+-------+ PTA     123               0.75 biphasic         +--------+------------------+-----+---------+-------+ DP      122               0.75 biphasic         +--------+------------------+-----+---------+-------+ +-------+---------------+----------------+ ABI/TBIToday's ABI/TBIPrevious ABI/TBI +-------+---------------+----------------+ Right  0.62                            +-------+---------------+----------------+ Left   0.75                            +-------+---------------+----------------+  Right Doppler Findings: +-----------+--------+-----+---------+-----------------------------------------+ Site       PressureIndexDoppler  Comments                                  +-----------+--------+-----+---------+-----------------------------------------+ Brachial   151          triphasic                                          +-----------+--------+-----+---------+-----------------------------------------+ Radial                   triphasic                                          +-----------+--------+-----+---------+-----------------------------------------+ Ulnar                   triphasic                                          +-----------+--------+-----+---------+-----------------------------------------+  Palmar Arch                      Palmar waveforms are obliterated with                                      radial and ulnar compression.             +-----------+--------+-----+---------+-----------------------------------------+  Left Doppler Findings: +-----------+--------+-----+---------+-----------------------------------------+ Site       PressureIndexDoppler  Comments                                  +-----------+--------+-----+---------+-----------------------------------------+ Brachial   163          triphasic                                          +-----------+--------+-----+---------+-----------------------------------------+ Radial                  triphasic                                          +-----------+--------+-----+---------+-----------------------------------------+ Ulnar                   triphasic                                          +-----------+--------+-----+---------+-----------------------------------------+ Palmar Arch                      Palmar waveforms are obliterated with                                      radial compression and remain within                                       normal limits with ulnar compression.     +-----------+--------+-----+---------+-----------------------------------------+  Summary: Right Carotid: Velocities in the right ICA are consistent with a 1-39% stenosis. Left Carotid: Velocities in the left ICA are consistent with a 1-39% stenosis. Vertebrals: Bilateral vertebral arteries demonstrate antegrade flow. Right ABI: Resting right ankle-brachial index indicates moderate right lower  extremity arterial disease. Left ABI: Resting left ankle-brachial index indicates moderate left lower extremity arterial disease.   Electronically signed by Servando Snare MD on 11/21/2018 at 5:59:16 PM.    Final     Cardiac Studies   LEFT HEART CATH AND CORONARY ANGIOGRAPHY   Conclusion     Prox RCA to Mid RCA lesion is 30% stenosed.  RPDA lesion is 99% stenosed.  Post Atrio lesion is 100% stenosed.  Ost 2nd Diag to 2nd Diag lesion is 99% stenosed.  Prox LAD lesion is 95% stenosed.  Ost 1st Diag lesion is 80% stenosed.  Ost Cx to Prox Cx lesion is 70% stenosed.  Ost LM lesion is 70% stenosed.  1. Moderately severe ostial left main  stenosis.  2. Severe stenosis in the heavily calcified mid LAD 3. Moderately severe proximal Circumflex stenosis 4. The RCA has mild calcific disease through the proximal and mid segments. The PDA is a moderate caliber vessel with severe stenosis. 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  5. 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 I will ask CT surgery to see him to discuss CABG. Given chronic kidney disease, I will admit him for hydration. Will hold Plavix. He will need Plavix washout prior to surgery. Will arrange echo tomorrow to assess LVEF. Will start ASA. Continue beta blocker and statin. Given elevated filling pressures, one dose of IV Lasix tonight.    Echo 11/21/18 IMPRESSIONS    1. The left ventricle has normal systolic function of 16-60%. The cavity size was mildly increased. There is no increased left ventricular wall thickness. Echo evidence of impaired diastolic relaxation.  2. The right ventricle has normal systolic function. The cavity was normal. There is no increase in right ventricular wall thickness.  3. Right atrial size was mildly dilated.  4. The mitral valve is normal in structure. There is mild to moderate mitral annular  calcification present. No evidence of mitral valve stenosis. No mitral regurgitation.  5. The tricuspid valve is normal in structure.  6. The aortic valve is tricuspid There is mild calcification of the aortic valve. No aortic stenosis.  7. There is mild dilatation of the aortic root and ascending aorta.  8. No evidence of left ventricular regional wall motion abnormalities.  9. No complete TR doppler jet so unable to estimate PA systolic pressure.  Patient Profile     78 y.o. male with hx of cryptogenic stroke s/p ILR (no afib up to date). 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   Assessment & Plan    1. 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 Elevated LVEDP.  Echocardiogram showed preserved LV function to 60 to 65% and impaired diastolic dysfunction. -Seen by surgery and plan for CABG either Monday or Tuesday next week once Plavix was out (X for stroke).  Continue aspirin, statin and beta-blocker.  2.  Acute on CKD stage III - Seems baseline Scr 1.3-1.5. Given elevated LVEDP, given IV lasix 40mg  x 1.  Initially renal function improved to 1.6 yesterday however 3.02 today.  Likely due to contrast induced nephropathy.  Hold lisinopril/HCTZ.  Patient is euvolemic and denies CHF symptoms. -Follow closely.  3. HTN -Cardizem and Toprol.  Now on Coreg 6.25 mg twice daily, will not uptitrate given baseline bradycardia.  Stopped Lisinopril/HCTZ due to elevated creatinine today.  Follow blood pressure closely, if elevated, will up-titrate hydralazine or add amlodipine.  4. HLD - 11/12/2018: Cholesterol 138; HDL 25.60; Triglycerides 214.0; VLDL 42.8  - Increased Crestor to 40mg  daily.   5.DM - A1c 6.8 09/2018. On Insulin.    For questions or updates, please contact [place] Please consult [email or web address] for contact info under        SignedLeanor Kail, PA  11/22/2018, 8:28 AM     Patient seen and examined. Agree with assessment and plan.  Cath data reviewed.  Severe multivessel CAD with high-grade left main and multivessel CAD as noted above.  Last dose of Plavix was 11/20/2018.  Creatinine increased to 3.02 post-cath; was 1.51 on 11/20/2018.  No rales on exam or JVD.  Will initiate low-dose IV hydration; now off ACE inhibition and diuretic.  Follow-up renal function in a.m. Echo reveals normal systolic function with EF 60 to 65% with impaired diastolic relaxation.  There is mild to moderate mitral annular calcification, mild aortic sclerosis, and mild dilation of the aortic root and ascending aorta.  Patient states he was seen by Dr. Cyndia Bent for his CABG revascularization.  Tentative plan for CABG revascularization Monday or Tuesday.   Troy Sine, MD, [facility] 11/22/2018 1:53 PM

## 2018-11-22 NOTE — Plan of Care (Signed)
  Problem: Activity: Goal: Ability to return to baseline activity level will improve Outcome: Progressing   Problem: Cardiovascular: Goal: Ability to achieve and maintain adequate cardiovascular perfusion will improve Outcome: Progressing Goal: Vascular access site(s) Level 0-1 will be maintained Outcome: Progressing   Problem: Health Behavior/Discharge Planning: Goal: Ability to safely manage health-related needs after discharge will improve Outcome: Progressing   Problem: Education: Goal: Knowledge of General Education information will improve Description Including pain rating scale, medication(s)/side effects and non-pharmacologic comfort measures Outcome: Progressing   Problem: Health Behavior/Discharge Planning: Goal: Ability to manage health-related needs will improve Outcome: Progressing   Problem: Clinical Measurements: Goal: Ability to maintain clinical measurements within normal limits will improve Outcome: Progressing Goal: Will remain free from infection Outcome: Progressing Goal: Diagnostic test results will improve Outcome: Progressing Goal: Respiratory complications will improve Outcome: Progressing Goal: Cardiovascular complication will be avoided Outcome: Progressing   Problem: Activity: Goal: Risk for activity intolerance will decrease Outcome: Progressing   Problem: Nutrition: Goal: Adequate nutrition will be maintained Outcome: Progressing   Problem: Coping: Goal: Level of anxiety will decrease Outcome: Progressing   Problem: Elimination: Goal: Will not experience complications related to bowel motility Outcome: Progressing Goal: Will not experience complications related to urinary retention Outcome: Progressing   Problem: Pain Managment: Goal: General experience of comfort will improve Outcome: Progressing   Problem: Safety: Goal: Ability to remain free from injury will improve Outcome: Progressing   Problem: Skin Integrity: Goal:  Risk for impaired skin integrity will decrease Outcome: Progressing

## 2018-11-22 NOTE — Progress Notes (Signed)
CARDIAC REHAB PHASE I   PRE:  Rate/Rhythm: 65 SR  BP:  Supine: 152/70  Sitting:  Standing:    SaO2: 95%RA  MODE:  Ambulation: 430 ft   POST:  Rate/Rhythm: 84 SR  BP:  Supine: 132/70  Sitting:   Standing:    SaO2: 97%RA 1130-1215 Pt stated he might go home before surgery so walked with pt to evaluate response to ex. No CP or SOB. Back to bed after walk. Gave pt IS and he demonstrated 1250 ml correctly with cues. Gave [facility] booklet, care guide, in the tube handout. Discussed importance of IS and walking after surgery. Reviewed sternal precautions. Pt stated he does not think he will have anyone available after discharge. Will need to address after surgery. Pt on pre op video to view.   Graylon Good, RN BSN  11/22/2018 12:11 PM

## 2018-11-23 LAB — GLUCOSE, CAPILLARY
Glucose-Capillary: 54 mg/dL — ABNORMAL LOW (ref 70–99)
Glucose-Capillary: 64 mg/dL — ABNORMAL LOW (ref 70–99)
Glucose-Capillary: 87 mg/dL (ref 70–99)
Glucose-Capillary: 120 mg/dL — ABNORMAL HIGH (ref 70–99)
Glucose-Capillary: 126 mg/dL — ABNORMAL HIGH (ref 70–99)

## 2018-11-23 LAB — BASIC METABOLIC PANEL
Anion gap: 7 (ref 5–15)
BUN: 35 mg/dL — ABNORMAL HIGH (ref 8–23)
CO2: 23 mmol/L (ref 22–32)
Calcium: 8.2 mg/dL — ABNORMAL LOW (ref 8.9–10.3)
Chloride: 112 mmol/L — ABNORMAL HIGH (ref 98–111)
Creatinine, Ser: 2.81 mg/dL — ABNORMAL HIGH (ref 0.61–1.24)
GFR calc Af Amer: 24 mL/min — ABNORMAL LOW (ref >60)
GFR calc non Af Amer: 21 mL/min — ABNORMAL LOW (ref >60)
GLUCOSE: 57 mg/dL — AB (ref 70–99)
Potassium: 3.7 mmol/L (ref 3.5–5.1)
Sodium: 142 mmol/L (ref 135–145)

## 2018-11-23 MED ORDER — INSULIN ASPART 100 UNIT/ML ~~LOC~~ SOLN: 0.0000 [IU] | Freq: Three times a day (TID) | SUBCUTANEOUS | Status: DC

## 2018-11-23 MED ORDER — INSULIN GLARGINE 100 UNIT/ML ~~LOC~~ SOLN
60.0000 [IU] | Freq: Every day | SUBCUTANEOUS | Status: DC
  Administered 2018-11-24 – 2018-11-25 (×2): 60 [IU] via SUBCUTANEOUS
  Filled 2018-11-23 (×5): qty 0.6

## 2018-11-23 MED ORDER — CARVEDILOL 3.125 MG PO TABS
3.1250 mg | ORAL_TABLET | Freq: Two times a day (BID) | ORAL | Status: DC
  Administered 2018-11-24 – 2018-11-25 (×4): 3.125 mg via ORAL
  Filled 2018-11-23 (×5): qty 1

## 2018-11-23 MED ORDER — HYDRALAZINE HCL 25 MG PO TABS
37.5000 mg | ORAL_TABLET | Freq: Three times a day (TID) | ORAL | Status: DC
  Administered 2018-11-23 – 2018-11-24 (×3): 37.5 mg via ORAL
  Filled 2018-11-23 (×4): qty 2

## 2018-11-23 NOTE — Progress Notes (Signed)
Inpatient Diabetes Program Recommendations  AACE/ADA: New Consensus Statement on Inpatient Glycemic Control (2015)  Target Ranges:  Prepandial:   less than 140 mg/dL      Peak postprandial:   less than 180 mg/dL (1-2 hours)      Critically ill patients:  140 - 180 mg/dL   Lab Results  Component Value Date   GLUCAP 64 (L) 11/23/2018   HGBA1C 6.8 (A) 10/03/2018    Review of Glycemic Control Results for BARACK, NICODEMUS" (MRN [id number]) as of 11/23/2018 10:04  Ref. Range 11/22/2018 07:18 11/22/2018 12:22 11/22/2018 16:16 11/22/2018 21:48 11/23/2018 07:37 11/23/2018 08:44  Glucose-Capillary Latest Ref Range: 70 - 99 mg/dL 97 105 (H) 145 (H) 115 (H) 54 (L) 64 (L)   Diabetes history: DM 2 Outpatient Diabetes medications:  Lantus 100 units daily Current orders for Inpatient glycemic control:  Lantus 100 units daily  Inpatient Diabetes Program Recommendations:   Note low blood sugars this morning. Please reduce Lantus to 60 units daily. Also please add Novolog sensitive tid with meals and HS.  Will see patient on 11/25/18.   Thanks,  Adah Perl, RN, BC-ADM Inpatient Diabetes Coordinator Pager [phone] (8a-5p)

## 2018-11-23 NOTE — Progress Notes (Signed)
Progress Note  Patient Name: Jake Gross Date of Encounter: 11/23/2018  Primary Cardiologist: New Dr Angelena Form  Subjective   No complaints  Inpatient Medications    Scheduled Meds: . aspirin  81 mg Oral Daily  . carvedilol  6.25 mg Oral BID WC  . heparin injection (subcutaneous)  5,000 Units Subcutaneous Q8H  . hydrALAZINE  25 mg Oral TID  . insulin glargine  100 Units Subcutaneous Daily  . isosorbide mononitrate  60 mg Oral Daily  . rosuvastatin  40 mg Oral QHS  . sodium chloride flush  3 mL Intravenous Q12H   Continuous Infusions: . sodium chloride    . sodium chloride 35 mL/hr at 11/22/18 1536   PRN Meds: sodium chloride, acetaminophen, hydrALAZINE, ondansetron (ZOFRAN) IV, sodium chloride flush   Vital Signs    Vitals:   11/22/18 2029 11/22/18 2145 11/23/18 0158 11/23/18 0511  BP: (!) 156/62 (!) 147/63 (!) 142/68 (!) 162/69  Pulse: (!) 54   (!) 45  Resp: 18   16  Temp: 98.1 F (36.7 C)   97.9 F (36.6 C)  TempSrc: Oral   Oral  SpO2: 95%   96%  Weight:    99.1 kg  Height:        Intake/Output Summary (Last 24 hours) at 11/23/2018 0727 Last data filed at 11/23/2018 0719 Gross per 24 hour  Intake 1044.6 ml  Output 1900 ml  Net -855.4 ml   Last 3 Weights 11/23/2018 11/22/2018 11/20/2018  Weight (lbs) 218 lb 6.4 oz 218 lb 224 lb  Weight (kg) 99.066 kg 98.884 kg 101.606 kg      Telemetry    Sinus brady - Personally Reviewed  ECG    na  Physical Exam   GEN: No acute distress.   Neck: No JVD Cardiac: RRR, no murmurs, rubs, or gallops.  Respiratory: Clear to auscultation bilaterally. GI: Soft, nontender, non-distended  MS: No edema; No deformity. Neuro:  Nonfocal  Psych: Normal affect   Labs    Chemistry Recent Labs  Lab 11/21/18 0502 11/22/18 0453 11/23/18 0553  NA 143 142 142  K 3.8 3.8 3.7  CL 108 108 112*  CO2 23 23 23   GLUCOSE 91 97 57*  BUN 17 29* 35*  CREATININE 1.61* 3.02* 2.81*  CALCIUM 8.9 8.8* 8.2*  GFRNONAA 41* 19* 21*    GFRAA 48* 22* 24*  ANIONGAP 12 11 7      Hematology Recent Labs  Lab 11/21/18 0502  WBC 7.7  RBC 4.78  HGB 12.9*  HCT 40.4  MCV 84.5  MCH 27.0  MCHC 31.9  RDW 14.4  PLT 198    Cardiac EnzymesNo results for input(s): TROPONINI in the last 168 hours. No results for input(s): TROPIPOC in the last 168 hours.   BNPNo results for input(s): BNP, PROBNP in the last 168 hours.   DDimer No results for input(s): DDIMER in the last 168 hours.   Radiology    Vas US Doppler Pre Cabg  Result Date: 11/21/2018 PREOPERATIVE VASCULAR EVALUATION  Indications: Pre-CABG. Performing Technologist: Carlos Levering RVT  Examination Guidelines: A complete evaluation includes B-mode imaging, spectral Doppler, color Doppler, and power Doppler as needed of all accessible portions of each vessel. Bilateral testing is considered an integral part of a complete examination. Limited examinations for reoccurring indications may be performed as noted.  Right Carotid Findings: +----------+--------+--------+--------+-----------------------+--------+           PSV cm/sEDV cm/sStenosisDescribe  Comments +----------+--------+--------+--------+-----------------------+--------+ CCA Prox  72      14              smooth and heterogenous         +----------+--------+--------+--------+-----------------------+--------+ CCA Distal63      13              smooth and heterogenous         +----------+--------+--------+--------+-----------------------+--------+ ICA Prox  76      22              smooth and heterogenous         +----------+--------+--------+--------+-----------------------+--------+ ICA Distal68      22                                              +----------+--------+--------+--------+-----------------------+--------+ ECA       159     11                                              +----------+--------+--------+--------+-----------------------+--------+ Portions of this  table do not appear on this page. +----------+--------+-------+--------+------------+           PSV cm/sEDV cmsDescribeArm Pressure +----------+--------+-------+--------+------------+ Subclavian117                    151          +----------+--------+-------+--------+------------+ +---------+--------+--+--------+--+---------+ VertebralPSV cm/s40EDV cm/s12Antegrade +---------+--------+--+--------+--+---------+ Left Carotid Findings: +----------+--------+-------+--------+--------------------------------+--------+           PSV cm/sEDV    StenosisDescribe                        Comments                   cm/s                                                    +----------+--------+-------+--------+--------------------------------+--------+ CCA Prox  68      14             smooth and heterogenous                  +----------+--------+-------+--------+--------------------------------+--------+ CCA Distal77      17             smooth and heterogenous                  +----------+--------+-------+--------+--------------------------------+--------+ ICA Prox  75      30             smooth, heterogenous and                                                  calcific                                 +----------+--------+-------+--------+--------------------------------+--------+ ICA Distal227     78  tortuous +----------+--------+-------+--------+--------------------------------+--------+ ECA       140                                                             +----------+--------+-------+--------+--------------------------------+--------+ +----------+--------+--------+--------+------------+ SubclavianPSV cm/sEDV cm/sDescribeArm Pressure +----------+--------+--------+--------+------------+           183                     163          +----------+--------+--------+--------+------------+  +---------+--------+--+--------+--+---------+ VertebralPSV cm/s61EDV cm/s15Antegrade +---------+--------+--+--------+--+---------+  ABI Findings: +--------+------------------+-----+---------+--------+ Right   Rt Pressure (mmHg)IndexWaveform Comment  +--------+------------------+-----+---------+--------+ [id number]                    triphasic         +--------+------------------+-----+---------+--------+ PTA     101               0.62 biphasic          +--------+------------------+-----+---------+--------+ DP      100               0.61 biphasic          +--------+------------------+-----+---------+--------+ +--------+------------------+-----+---------+-------+ Left    Lt Pressure (mmHg)IndexWaveform Comment +--------+------------------+-----+---------+-------+ [id number]                    triphasic        +--------+------------------+-----+---------+-------+ PTA     123               0.75 biphasic         +--------+------------------+-----+---------+-------+ DP      122               0.75 biphasic         +--------+------------------+-----+---------+-------+ +-------+---------------+----------------+ ABI/TBIToday's ABI/TBIPrevious ABI/TBI +-------+---------------+----------------+ Right  0.62                            +-------+---------------+----------------+ Left   0.75                            +-------+---------------+----------------+  Right Doppler Findings: +-----------+--------+-----+---------+-----------------------------------------+ Site       PressureIndexDoppler  Comments                                  +-----------+--------+-----+---------+-----------------------------------------+ Brachial   151          triphasic                                          +-----------+--------+-----+---------+-----------------------------------------+ Radial                  triphasic                                           +-----------+--------+-----+---------+-----------------------------------------+ Ulnar                   triphasic                                          +-----------+--------+-----+---------+-----------------------------------------+  Palmar Arch                      Palmar waveforms are obliterated with                                      radial and ulnar compression.             +-----------+--------+-----+---------+-----------------------------------------+  Left Doppler Findings: +-----------+--------+-----+---------+-----------------------------------------+ Site       PressureIndexDoppler  Comments                                  +-----------+--------+-----+---------+-----------------------------------------+ Brachial   163          triphasic                                          +-----------+--------+-----+---------+-----------------------------------------+ Radial                  triphasic                                          +-----------+--------+-----+---------+-----------------------------------------+ Ulnar                   triphasic                                          +-----------+--------+-----+---------+-----------------------------------------+ Palmar Arch                      Palmar waveforms are obliterated with                                      radial compression and remain within                                       normal limits with ulnar compression.     +-----------+--------+-----+---------+-----------------------------------------+  Summary: Right Carotid: Velocities in the right ICA are consistent with a 1-39% stenosis. Left Carotid: Velocities in the left ICA are consistent with a 1-39% stenosis. Vertebrals: Bilateral vertebral arteries demonstrate antegrade flow. Right ABI: Resting right ankle-brachial index indicates moderate right lower extremity arterial disease. Left ABI: Resting left ankle-brachial  index indicates moderate left lower extremity arterial disease.   Electronically signed by Servando Snare MD on 11/21/2018 at 5:59:16 PM.    Final     Cardiac Studies     Patient Profile     78 y.o.malewith hx of cryptogenic stroke s/p ILR (no afib up to date). 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  Assessment & Plan    1. CAD - - Cath showedsevere left main and three vessel CADas above. Elevated LVEDP.  Echocardiogram showed preserved LV function to 60 to 65% and impaired diastolic dysfunction. - plan for CABG likely  Tuesday once plavix has washed ou. tLast dose of Plavix was 11/20/2018 - medical therapy with ASA 81, coreg 3.125mg  bid. No ACE/ARB due to AKI. Crestor 40.   2. AKI on CKD 3 Seems baseline Scr 1.3-1.5. Given elevated LVEDP, given IV lasix 40mg  x 1.  Initially renal function improved to 1.6 yesterday however then up to 3.02 - suspect come contrast nephropathy -lisinopril and HCTZ on hold - started on gentle IVFs, Cr trending down to 2.8 today.   3. HTN - elevated. Off lisinopril and HCTZ due to AKI - increase hydral to 37.5mg  tid  4. Sinus bradycardia - to high 40s low 50s - lower coreg to 3.125mg  bid    For questions or updates, please contact [place] Please consult [email or web address] for contact info under        Signed, Carlyle Dolly, MD  11/23/2018, 7:27 AM

## 2018-11-23 NOTE — Progress Notes (Signed)
CRITICAL VALUE ALERT  Critical Value:  Blood sugar 54  Date & Time Notied:  0745  Provider Notified:   Orders Received/Actions taken:

## 2018-11-24 ENCOUNTER — Other Ambulatory Visit: Payer: Self-pay | Admitting: Internal Medicine

## 2018-11-24 LAB — GLUCOSE, CAPILLARY
GLUCOSE-CAPILLARY: 147 mg/dL — AB (ref 70–99)
Glucose-Capillary: 140 mg/dL — ABNORMAL HIGH (ref 70–99)
Glucose-Capillary: 150 mg/dL — ABNORMAL HIGH (ref 70–99)
Glucose-Capillary: 134 mg/dL — ABNORMAL HIGH (ref 70–99)

## 2018-11-24 LAB — BASIC METABOLIC PANEL
Anion gap: 10 (ref 5–15)
BUN: 36 mg/dL — ABNORMAL HIGH (ref 8–23)
CO2: 23 mmol/L (ref 22–32)
Calcium: 8.3 mg/dL — ABNORMAL LOW (ref 8.9–10.3)
Chloride: 109 mmol/L (ref 98–111)
Creatinine, Ser: 2.49 mg/dL — ABNORMAL HIGH (ref 0.61–1.24)
GFR calc Af Amer: 28 mL/min — ABNORMAL LOW (ref >60)
GFR calc non Af Amer: 24 mL/min — ABNORMAL LOW (ref >60)
GLUCOSE: 143 mg/dL — AB (ref 70–99)
Potassium: 4 mmol/L (ref 3.5–5.1)
Sodium: 142 mmol/L (ref 135–145)

## 2018-11-24 MED ORDER — HYDRALAZINE HCL 50 MG PO TABS
50.0000 mg | ORAL_TABLET | Freq: Three times a day (TID) | ORAL | Status: DC
  Administered 2018-11-24 – 2018-11-25 (×3): 50 mg via ORAL
  Filled 2018-11-24 (×3): qty 1

## 2018-11-24 MED ORDER — INSULIN ASPART 100 UNIT/ML ~~LOC~~ SOLN
0.0000 [IU] | Freq: Three times a day (TID) | SUBCUTANEOUS | Status: DC
  Administered 2018-11-24 (×3): 1 [IU] via SUBCUTANEOUS
  Administered 2018-11-25: 2 [IU] via SUBCUTANEOUS
  Administered 2018-11-25: 1 [IU] via SUBCUTANEOUS

## 2018-11-24 NOTE — Progress Notes (Addendum)
Progress Note  Patient Name: Jake Gross Date of Encounter: 11/24/2018  Primary Cardiologist: Gwinda Maine  Subjective   No complaints  Inpatient Medications    Scheduled Meds: . aspirin  81 mg Oral Daily  . carvedilol  3.125 mg Oral BID WC  . heparin injection (subcutaneous)  5,000 Units Subcutaneous Q8H  . hydrALAZINE  37.5 mg Oral TID  . insulin aspart  0-9 Units Subcutaneous TID WC  . insulin glargine  60 Units Subcutaneous Daily  . isosorbide mononitrate  60 mg Oral Daily  . rosuvastatin  40 mg Oral QHS  . sodium chloride flush  3 mL Intravenous Q12H   Continuous Infusions: . sodium chloride    . sodium chloride 35 mL/hr at 11/24/18 0346   PRN Meds: sodium chloride, acetaminophen, hydrALAZINE, ondansetron (ZOFRAN) IV, sodium chloride flush   Vital Signs    Vitals:   11/24/18 0650 11/24/18 1001 11/24/18 1203 11/24/18 1214  BP: (!) 181/71 (!) 191/71 139/66 139/66  Pulse: (!) 49 (!) 51 (!) 52 (!) 52  Resp: 18  20 20   Temp: 98.5 F (36.9 C)  97.7 F (36.5 C) 97.7 F (36.5 C)  TempSrc: Oral  Oral Oral  SpO2: 98% 95% 94% 94%  Weight:      Height:        Intake/Output Summary (Last 24 hours) at 11/24/2018 1254 Last data filed at 11/24/2018 1009 Gross per 24 hour  Intake 1660.54 ml  Output 1525 ml  Net 135.54 ml   Last 3 Weights 11/24/2018 11/23/2018 11/22/2018  Weight (lbs) 218 lb 9.6 oz 218 lb 6.4 oz 218 lb  Weight (kg) 99.156 kg 99.066 kg 98.884 kg      Telemetry    Sinus brady- Personally Reviewed  ECG    na  Physical Exam   GEN: No acute distress.   Neck: No JVD Cardiac: RRR, no murmurs, rubs, or gallops.  Respiratory: Clear to auscultation bilaterally. GI: Soft, nontender, non-distended  MS: No edema; No deformity. Neuro:  Nonfocal  Psych: Normal affect   Labs    Chemistry Recent Labs  Lab 11/22/18 0453 11/23/18 0553 11/24/18 0441  NA 142 142 142  K 3.8 3.7 4.0  CL 108 112* 109  CO2 23 23 23   GLUCOSE 97 57* 143*  BUN 29* 35*  36*  CREATININE 3.02* 2.81* 2.49*  CALCIUM 8.8* 8.2* 8.3*  GFRNONAA 19* 21* 24*  GFRAA 22* 24* 28*  ANIONGAP 11 7 10      Hematology Recent Labs  Lab 11/21/18 0502  WBC 7.7  RBC 4.78  HGB 12.9*  HCT 40.4  MCV 84.5  MCH 27.0  MCHC 31.9  RDW 14.4  PLT 198    Cardiac EnzymesNo results for input(s): TROPONINI in the last 168 hours. No results for input(s): TROPIPOC in the last 168 hours.   BNPNo results for input(s): BNP, PROBNP in the last 168 hours.   DDimer No results for input(s): DDIMER in the last 168 hours.   Radiology    No results found.  Cardiac Studies     Patient Profile     78 y.o.malewith hx of cryptogenic stroke s/p ILR (no afib up to date). 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  Assessment & Plan    1. CAD/Unstable angina - - Cath showedsevere left main and three vessel CADas above. Elevated LVEDP.Echocardiogram showed preserved LV function to 60 to  65% and impaired diastolic dysfunction. - plan for CABG likely Tuesday once plavix has washed ou. tLast dose of Plavix was 11/20/2018 - medical therapy with ASA 81, coreg 3.125mg  bid. No ACE/ARB due to AKI. Crestor 40.   2. AKI on CKD 3 Seems baseline Scr 1.3-1.5. Given elevated LVEDP, given IV lasix 40mg  x 1.Initially renal function improved to1.6 yesterday however then up to 3.02 - suspect come contrast nephropathy -lisinopril and HCTZ on hold - started on gentle IVFs, Cr trending down to further today to 2.49  3. HTN - elevated. Off lisinopril and HCTZ due to AKI - increase hydral to 50 mg tid  4. Sinus bradycardia - to high 40s low 50s - lowered coreg to 3.125mg  bid yesterday - still with some bradycardia, asymptomatic. In setting of CAD and unstable angina continue low dose beta blocker since asymptomatic.   5. Low blood sugars - lowered lantus yesterday, numbers improved today  For questions or updates,  please contact [place] Please consult [email or web address] for contact info under        Signed, Carlyle Dolly, MD  11/24/2018, 12:54 PM

## 2018-11-25 DIAGNOSIS — N183 Chronic kidney disease, stage 3 (moderate): Secondary | ICD-10-CM

## 2018-11-25 DIAGNOSIS — I10 Essential (primary) hypertension: Secondary | ICD-10-CM

## 2018-11-25 LAB — TYPE AND SCREEN
ABO/RH(D): O POS
Antibody Screen: NEGATIVE

## 2018-11-25 LAB — BASIC METABOLIC PANEL
Anion gap: 7 (ref 5–15)
BUN: 28 mg/dL — ABNORMAL HIGH (ref 8–23)
CO2: 23 mmol/L (ref 22–32)
CREATININE: 2.04 mg/dL — AB (ref 0.61–1.24)
Calcium: 8.2 mg/dL — ABNORMAL LOW (ref 8.9–10.3)
Chloride: 113 mmol/L — ABNORMAL HIGH (ref 98–111)
GFR calc non Af Amer: 31 mL/min — ABNORMAL LOW (ref >60)
GFR, EST AFRICAN AMERICAN: 36 mL/min — AB (ref >60)
Glucose, Bld: 122 mg/dL — ABNORMAL HIGH (ref 70–99)
Potassium: 3.9 mmol/L (ref 3.5–5.1)
Sodium: 143 mmol/L (ref 135–145)

## 2018-11-25 LAB — BLOOD GAS, ARTERIAL
Acid-base deficit: 4 mmol/L — ABNORMAL HIGH (ref 0.0–2.0)
Bicarbonate: 19.9 mmol/L — ABNORMAL LOW (ref 20.0–28.0)
Drawn by: 317771
FIO2: 21
O2 Saturation: 96.7 %
PATIENT TEMPERATURE: 98.6
pCO2 arterial: 32.6 mmHg (ref 32.0–48.0)
pH, Arterial: 7.403 (ref 7.350–7.450)
pO2, Arterial: 85.7 mmHg (ref 83.0–108.0)

## 2018-11-25 LAB — GLUCOSE, CAPILLARY
Glucose-Capillary: 110 mg/dL — ABNORMAL HIGH (ref 70–99)
Glucose-Capillary: 165 mg/dL — ABNORMAL HIGH (ref 70–99)
Glucose-Capillary: 146 mg/dL — ABNORMAL HIGH (ref 70–99)
Glucose-Capillary: 124 mg/dL — ABNORMAL HIGH (ref 70–99)

## 2018-11-25 MED ORDER — NITROGLYCERIN IN D5W 200-5 MCG/ML-% IV SOLN
2.0000 ug/min | INTRAVENOUS | Status: DC
  Filled 2018-11-25: qty 250

## 2018-11-25 MED ORDER — SODIUM CHLORIDE 0.9 % IV SOLN
750.0000 mg | INTRAVENOUS | Status: DC
  Filled 2018-11-25: qty 750

## 2018-11-25 MED ORDER — SODIUM CHLORIDE 0.9 % IV SOLN
1.5000 g | INTRAVENOUS | Status: AC
  Administered 2018-11-26: .75 g via INTRAVENOUS
  Administered 2018-11-26: 1.5 g via INTRAVENOUS
  Filled 2018-11-25: qty 1.5

## 2018-11-25 MED ORDER — TRANEXAMIC ACID (OHS) PUMP PRIME SOLUTION
2.0000 mg/kg | INTRAVENOUS | Status: DC
  Filled 2018-11-25: qty 1.98

## 2018-11-25 MED ORDER — CHLORHEXIDINE GLUCONATE 0.12 % MT SOLN
15.0000 mL | Freq: Once | OROMUCOSAL | Status: AC
  Administered 2018-11-26: 15 mL via OROMUCOSAL
  Filled 2018-11-25: qty 15

## 2018-11-25 MED ORDER — TRANEXAMIC ACID (OHS) BOLUS VIA INFUSION
15.0000 mg/kg | INTRAVENOUS | Status: AC
  Administered 2018-11-26: 1482 mg via INTRAVENOUS
  Filled 2018-11-25: qty 1482

## 2018-11-25 MED ORDER — MAGNESIUM SULFATE 50 % IJ SOLN
40.0000 meq | INTRAMUSCULAR | Status: DC
  Filled 2018-11-25: qty 9.85

## 2018-11-25 MED ORDER — HYDRALAZINE HCL 50 MG PO TABS
100.0000 mg | ORAL_TABLET | Freq: Three times a day (TID) | ORAL | Status: DC
  Administered 2018-11-25 (×2): 100 mg via ORAL
  Filled 2018-11-25 (×2): qty 2

## 2018-11-25 MED ORDER — TRANEXAMIC ACID 1000 MG/10ML IV SOLN
1.5000 mg/kg/h | INTRAVENOUS | Status: DC
  Filled 2018-11-25: qty 25

## 2018-11-25 MED ORDER — DOPAMINE-DEXTROSE 3.2-5 MG/ML-% IV SOLN
0.0000 ug/kg/min | INTRAVENOUS | Status: DC
  Filled 2018-11-25: qty 250

## 2018-11-25 MED ORDER — BISACODYL 5 MG PO TBEC: 5.0000 mg | DELAYED_RELEASE_TABLET | Freq: Once | ORAL | Status: DC

## 2018-11-25 MED ORDER — METOPROLOL TARTRATE 12.5 MG HALF TABLET
12.5000 mg | ORAL_TABLET | Freq: Once | ORAL | Status: DC
  Filled 2018-11-25: qty 1

## 2018-11-25 MED ORDER — EPINEPHRINE PF 1 MG/ML IJ SOLN
0.0000 ug/min | INTRAVENOUS | Status: DC
  Filled 2018-11-25: qty 4

## 2018-11-25 MED ORDER — INSULIN REGULAR(HUMAN) IN NACL 100-0.9 UT/100ML-% IV SOLN
INTRAVENOUS | Status: DC
  Filled 2018-11-25: qty 100

## 2018-11-25 MED ORDER — MILRINONE LACTATE IN DEXTROSE 20-5 MG/100ML-% IV SOLN
0.3000 ug/kg/min | INTRAVENOUS | Status: DC
  Filled 2018-11-25: qty 100

## 2018-11-25 MED ORDER — VANCOMYCIN HCL 10 G IV SOLR
1250.0000 mg | INTRAVENOUS | Status: DC
  Filled 2018-11-25: qty 1250

## 2018-11-25 MED ORDER — SODIUM CHLORIDE 0.9 % IV SOLN
INTRAVENOUS | Status: DC
  Filled 2018-11-25: qty 30

## 2018-11-25 MED ORDER — NOREPINEPHRINE BITARTRATE 1 MG/ML IV SOLN
0.0000 ug/min | INTRAVENOUS | Status: DC
  Filled 2018-11-25: qty 4

## 2018-11-25 MED ORDER — DIAZEPAM 5 MG PO TABS
5.0000 mg | ORAL_TABLET | Freq: Once | ORAL | Status: AC
  Administered 2018-11-26: 5 mg via ORAL
  Filled 2018-11-25: qty 1

## 2018-11-25 MED ORDER — DEXMEDETOMIDINE HCL IN NACL 400 MCG/100ML IV SOLN
0.1000 ug/kg/h | INTRAVENOUS | Status: AC
  Administered 2018-11-26: 0.7 ug/kg/h via INTRAVENOUS
  Filled 2018-11-25: qty 100

## 2018-11-25 MED ORDER — PHENYLEPHRINE HCL-NACL 20-0.9 MG/250ML-% IV SOLN
30.0000 ug/min | INTRAVENOUS | Status: DC
  Filled 2018-11-25: qty 250

## 2018-11-25 MED ORDER — HYDRALAZINE HCL 20 MG/ML IJ SOLN
10.0000 mg | INTRAMUSCULAR | Status: DC | PRN
  Filled 2018-11-25: qty 1

## 2018-11-25 MED ORDER — POTASSIUM CHLORIDE 2 MEQ/ML IV SOLN
80.0000 meq | INTRAVENOUS | Status: DC
  Filled 2018-11-25: qty 40

## 2018-11-25 MED ORDER — TEMAZEPAM 15 MG PO CAPS
15.0000 mg | ORAL_CAPSULE | Freq: Once | ORAL | Status: AC | PRN
  Administered 2018-11-25: 15 mg via ORAL
  Filled 2018-11-25: qty 1

## 2018-11-25 MED ORDER — CHLORHEXIDINE GLUCONATE CLOTH 2 % EX PADS
6.0000 | MEDICATED_PAD | Freq: Once | CUTANEOUS | Status: AC
  Administered 2018-11-26: 6 via TOPICAL

## 2018-11-25 MED ORDER — CHLORHEXIDINE GLUCONATE CLOTH 2 % EX PADS
6.0000 | MEDICATED_PAD | Freq: Once | CUTANEOUS | Status: AC
  Administered 2018-11-25: 6 via TOPICAL

## 2018-11-25 MED ORDER — PLASMA-LYTE 148 IV SOLN
INTRAVENOUS | Status: DC
  Filled 2018-11-25 (×2): qty 2.5

## 2018-11-25 NOTE — Progress Notes (Addendum)
Progress Note  Patient Name: Jake Gross Date of Encounter: 11/25/2018  Primary Cardiologist: No primary care provider on file.   Subjective   Feeling well today. Hopeful for surgery in the morning.   Inpatient Medications    Scheduled Meds: . aspirin  81 mg Oral Daily  . carvedilol  3.125 mg Oral BID WC  . heparin injection (subcutaneous)  5,000 Units Subcutaneous Q8H  . hydrALAZINE  100 mg Oral TID  . insulin aspart  0-9 Units Subcutaneous TID WC  . insulin glargine  60 Units Subcutaneous Daily  . isosorbide mononitrate  60 mg Oral Daily  . rosuvastatin  40 mg Oral QHS  . sodium chloride flush  3 mL Intravenous Q12H   Continuous Infusions: . sodium chloride    . sodium chloride 35 mL/hr at 11/24/18 0346   PRN Meds: sodium chloride, acetaminophen, hydrALAZINE, ondansetron (ZOFRAN) IV, sodium chloride flush   Vital Signs    Vitals:   11/24/18 1214 11/24/18 2047 11/25/18 0449 11/25/18 0500  BP: 139/66 (!) 152/74 (!) 185/73   Pulse: (!) 52 (!) 49 (!) 47   Resp: 20 18 18    Temp: 97.7 F (36.5 C) 98.1 F (36.7 C) 97.9 F (36.6 C)   TempSrc: Oral Oral Oral   SpO2: 94% 95% 94%   Weight:    98.8 kg  Height:        Intake/Output Summary (Last 24 hours) at 11/25/2018 0935 Last data filed at 11/25/2018 0500 Gross per 24 hour  Intake 720 ml  Output 1300 ml  Net -580 ml   Last 3 Weights 11/25/2018 11/24/2018 11/23/2018  Weight (lbs) 217 lb 12.8 oz 218 lb 9.6 oz 218 lb 6.4 oz  Weight (kg) 98.793 kg 99.156 kg 99.066 kg      Telemetry    SB - Personally Reviewed  ECG    N/a - Personally Reviewed  Physical Exam   GEN: No acute distress.   Neck: No JVD Cardiac: RRR, no murmurs, rubs, or gallops.  Respiratory: Clear to auscultation bilaterally. GI: Soft, nontender, non-distended  MS: No edema; No deformity. Neuro:  Nonfocal  Psych: Normal affect   Labs    Chemistry Recent Labs  Lab 11/23/18 0553 11/24/18 0441 11/25/18 0508  NA 142 142 143  K 3.7  4.0 3.9  CL 112* 109 113*  CO2 23 23 23   GLUCOSE 57* 143* 122*  BUN 35* 36* 28*  CREATININE 2.81* 2.49* 2.04*  CALCIUM 8.2* 8.3* 8.2*  GFRNONAA 21* 24* 31*  GFRAA 24* 28* 36*  ANIONGAP 7 10 7      Hematology Recent Labs  Lab 11/21/18 0502  WBC 7.7  RBC 4.78  HGB 12.9*  HCT 40.4  MCV 84.5  MCH 27.0  MCHC 31.9  RDW 14.4  PLT 198    Cardiac EnzymesNo results for input(s): TROPONINI in the last 168 hours. No results for input(s): TROPIPOC in the last 168 hours.   BNPNo results for input(s): BNP, PROBNP in the last 168 hours.   DDimer No results for input(s): DDIMER in the last 168 hours.   Radiology    No results found.  Cardiac Studies   Cath: 11/20/2018   Prox RCA to Mid RCA lesion is 30% stenosed.  RPDA lesion is 99% stenosed.  Post Atrio lesion is 100% stenosed.  Ost 2nd Diag to 2nd Diag lesion is 99% stenosed.  Prox LAD lesion is 95% stenosed.  Ost 1st Diag lesion is 80% stenosed.  Ost Cx to  Prox Cx lesion is 70% stenosed.  Ost LM lesion is 70% stenosed.   1. Moderately severe ostial left main stenosis.  2. Severe stenosis in the heavily calcified mid LAD 3. Moderately severe proximal Circumflex stenosis 4. The RCA has mild calcific disease through the proximal and mid segments. The PDA is a moderate caliber vessel with severe stenosis. 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  5. 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 I will ask CT surgery to see him to discuss CABG. Given chronic kidney disease, I will admit him for hydration. Will hold Plavix. He will need Plavix washout prior to surgery. Will arrange echo tomorrow to assess LVEF. Will start ASA. Continue beta blocker and statin. Given elevated filling pressures, one dose of IV Lasix tonight.   TTE: 11/21/2018  IMPRESSIONS    1. The left ventricle has normal systolic function of 46-65%. The cavity size  was mildly increased. There is no increased left ventricular wall thickness. Echo evidence of impaired diastolic relaxation.  2. The right ventricle has normal systolic function. The cavity was normal. There is no increase in right ventricular wall thickness.  3. Right atrial size was mildly dilated.  4. The mitral valve is normal in structure. There is mild to moderate mitral annular calcification present. No evidence of mitral valve stenosis. No mitral regurgitation.  5. The tricuspid valve is normal in structure.  6. The aortic valve is tricuspid There is mild calcification of the aortic valve. No aortic stenosis.  7. There is mild dilatation of the aortic root and ascending aorta.  8. No evidence of left ventricular regional wall motion abnormalities.  9. No complete TR doppler jet so unable to estimate PA systolic pressure.   Patient Profile     78 y.o. male with hx of cryptogenic stroke s/p ILR (no afib up to date). 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  Assessment & Plan    1. CAD/Unstable angina: Cath showedsevere left main and three vessel CADas above. Elevated LVEDP.Echocardiogram showed preserved LV function to 60 to 65% and impaired diastolic dysfunction. - plan for CABG hopefully in morning as Cr is now trending down. Last dose of Plavix was 11/20/2018. - medical therapy with ASA 81, coreg 3.125mg  bid, Crestor 40mg . No ACE/ARB due to AKI.   2. AKI on CKD 3: Seems baseline Scr 1.3-1.5. Peaked at 3, now trending down to 2.04 today. Getting IVFs, weight and respiratory status stable.  - suspect come contrast nephropathy 2/2 to cath. -lisinopril and HCTZ on hold  3. HTN: elevated. Off lisinopril and HCTZ due to AKI - increase hydral to 100 mg tid  4. Sinus bradycardia: to high 40s low 50s - coreg was decreased to 3.125mg  BID yesterday. Stable at this time. Will continue the same.  5. Low blood  sugars:  - resolved, continue current Lantus dose.   For questions or updates, please contact [place] Please consult [email or web address] for contact info under   Signed, Reino Bellis, NP  11/25/2018, 9:35 AM    I have examined the patient and reviewed assessment and plan and discussed with patient.  Agree with above as stated.   Tentative plan for surgery tomorrow.  Acute renal failure is improving.  No anginal symptoms at this time. Continue medical therapy for BP and lipids.  Larae Grooms

## 2018-11-25 NOTE — Progress Notes (Signed)
CARDIAC REHAB PHASE I   PRE:  Rate/Rhythm: 61 SR  BP:  Supine:   Sitting: 177/72  Standing:    SaO2: 96%RA  MODE:  Ambulation: 400 ft   POST:  Rate/Rhythm: 83 SR  BP:  Supine:   Sitting: 192/67, 172/63  Standing:    SaO2: 98%RA 1002-1032 Pt walked 400 ft on RA pushing IV pole with steady gait. No CP. BP elevated. Pt stated he has been using IS and has watched pre op video with son.   Graylon Good, RN BSN  11/25/2018 10:28 AM

## 2018-11-25 NOTE — Anesthesia Preprocedure Evaluation (Deleted)
Anesthesia Evaluation    Reviewed: Allergy & Precautions, H&P , Patient's Chart, lab work & pertinent test results, reviewed documented beta blocker date and time   Airway        Dental   Pulmonary neg pulmonary ROS, former smoker,           Cardiovascular Exercise Tolerance: Good hypertension, Pt. on medications and Pt. on home beta blockers + angina negative cardio ROS       Neuro/Psych CVA negative neurological ROS  negative psych ROS   GI/Hepatic negative GI ROS, Neg liver ROS,   Endo/Other  negative endocrine ROSdiabetes, Insulin Dependent  Renal/GU Renal InsufficiencyRenal diseasenegative Renal ROS  negative genitourinary   Musculoskeletal  (+) Arthritis , Osteoarthritis,    Abdominal   Peds  Hematology negative hematology ROS (+)   Anesthesia Other Findings   Reproductive/Obstetrics negative OB ROS                             Anesthesia Physical Anesthesia Plan  ASA: IV  Anesthesia Plan: General   Post-op Pain Management:    Induction: Intravenous  PONV Risk Score and Plan: 2 and Midazolam, Treatment may vary due to age or medical condition and Ondansetron  Airway Management Planned: Oral ETT  Additional Equipment: Arterial line, PA Cath, CVP, TEE and Ultrasound Guidance Line Placement  Intra-op Plan:   Post-operative Plan: Post-operative intubation/ventilation  Informed Consent: I have reviewed the patients History and Physical, chart, labs and discussed the procedure including the risks, benefits and alternatives for the proposed anesthesia with the patient or authorized representative who has indicated his/her understanding and acceptance.     Dental advisory given  Plan Discussed with: CRNA  Anesthesia Plan Comments:         Anesthesia Quick Evaluation

## 2018-11-25 NOTE — Progress Notes (Signed)
Inpatient Diabetes Program Recommendations  AACE/ADA: New Consensus Statement on Inpatient Glycemic Control (2015)  Target Ranges:  Prepandial:   less than 140 mg/dL      Peak postprandial:   less than 180 mg/dL (1-2 hours)      Critically ill patients:  140 - 180 mg/dL   Lab Results  Component Value Date   GLUCAP 165 (H) 11/25/2018   HGBA1C 6.8 (A) 10/03/2018    Review of Glycemic Control Results for Jake Gross, Jake Gross" (MRN [id number]) as of 11/25/2018 16:04  Ref. Range 11/24/2018 12:00 11/24/2018 17:12 11/24/2018 21:39 11/25/2018 07:49 11/25/2018 11:16  Glucose-Capillary Latest Ref Range: 70 - 99 mg/dL 150 (H) 147 (H) 134 (H) 110 (H) 165 (H)   Diabetes history: DM 2 Outpatient Diabetes medications: Lantus 100 units daily Current orders for Inpatient glycemic control:  Lantus 60 units daily, Novolog sensitive tid with meals  Inpatient Diabetes Program Recommendations:    Spoke with patient regarding home DM medications and blood sugars.  He states that his blood sugars are usually good with 143 mg/dL being the highest.  We discussed low blood sugars and that Lantus has been reduced to 60 units daily while in the hospital.  He says he had a severe low blood sugar 3 years ago of 38 mg/dL, and had to call EMS.  We discussed the signs and symptoms of low blood sugars and how to treat.  Patient verbalized understanding.  We discussed that he will be on insulin drip on day of surgery and then transitioned back to SQ insulin on post-op day 1.  No further needs at this time.    Thanks  Adah Perl, RN, BC-ADM Inpatient Diabetes Coordinator Pager [phone] (8a-5p)

## 2018-11-26 ENCOUNTER — Inpatient Hospital Stay (HOSPITAL_COMMUNITY): Payer: Medicare Other | Admitting: Certified Registered Nurse Anesthetist

## 2018-11-26 ENCOUNTER — Inpatient Hospital Stay (HOSPITAL_COMMUNITY)
Admission: RE | Disposition: A | Discharge status: Home Health | Payer: Self-pay | Source: Home / Self Care | Attending: Cardiovascular Disease

## 2018-11-26 ENCOUNTER — Inpatient Hospital Stay (HOSPITAL_COMMUNITY): Payer: Medicare Other

## 2018-11-26 DIAGNOSIS — Z951 Presence of aortocoronary bypass graft: Secondary | ICD-10-CM

## 2018-11-26 HISTORY — PX: CORONARY ARTERY BYPASS GRAFT: SHX141

## 2018-11-26 HISTORY — PX: TEE WITHOUT CARDIOVERSION: SHX5443

## 2018-11-26 HISTORY — DX: Presence of aortocoronary bypass graft: Z95.1

## 2018-11-26 LAB — BASIC METABOLIC PANEL
Anion gap: 10 (ref 5–15)
BUN: 25 mg/dL — AB (ref 8–23)
CO2: 21 mmol/L — ABNORMAL LOW (ref 22–32)
Calcium: 8.7 mg/dL — ABNORMAL LOW (ref 8.9–10.3)
Chloride: 112 mmol/L — ABNORMAL HIGH (ref 98–111)
Creatinine, Ser: 1.92 mg/dL — ABNORMAL HIGH (ref 0.61–1.24)
GFR calc Af Amer: 39 mL/min — ABNORMAL LOW (ref >60)
GFR calc non Af Amer: 33 mL/min — ABNORMAL LOW (ref >60)
Glucose, Bld: 111 mg/dL — ABNORMAL HIGH (ref 70–99)
Potassium: 4 mmol/L (ref 3.5–5.1)
Sodium: 143 mmol/L (ref 135–145)

## 2018-11-26 LAB — POCT I-STAT 7, (LYTES, BLD GAS, ICA,H+H)
Acid-base deficit: 5 mmol/L — ABNORMAL HIGH (ref 0.0–2.0)
Acid-base deficit: 2 mmol/L (ref 0.0–2.0)
Acid-base deficit: 6 mmol/L — ABNORMAL HIGH (ref 0.0–2.0)
Acid-base deficit: 6 mmol/L — ABNORMAL HIGH (ref 0.0–2.0)
Acid-base deficit: 6 mmol/L — ABNORMAL HIGH (ref 0.0–2.0)
Acid-base deficit: 4 mmol/L — ABNORMAL HIGH (ref 0.0–2.0)
BICARBONATE: 22.8 mmol/L (ref 20.0–28.0)
BICARBONATE: 21 mmol/L (ref 20.0–28.0)
Bicarbonate: 21.5 mmol/L (ref 20.0–28.0)
Bicarbonate: 19.1 mmol/L — ABNORMAL LOW (ref 20.0–28.0)
Bicarbonate: 20.6 mmol/L (ref 20.0–28.0)
Bicarbonate: 20.1 mmol/L (ref 20.0–28.0)
CALCIUM ION: 1.26 mmol/L (ref 1.15–1.40)
Calcium, Ion: 1.22 mmol/L (ref 1.15–1.40)
Calcium, Ion: 0.99 mmol/L — ABNORMAL LOW (ref 1.15–1.40)
Calcium, Ion: 1.33 mmol/L (ref 1.15–1.40)
Calcium, Ion: 1.28 mmol/L (ref 1.15–1.40)
Calcium, Ion: 1.23 mmol/L (ref 1.15–1.40)
HCT: 31 % — ABNORMAL LOW (ref 39.0–52.0)
HCT: 31 % — ABNORMAL LOW (ref 39.0–52.0)
HCT: 33 % — ABNORMAL LOW (ref 39.0–52.0)
HCT: 34 % — ABNORMAL LOW (ref 39.0–52.0)
HEMATOCRIT: 35 % — AB (ref 39.0–52.0)
HEMATOCRIT: 33 % — AB (ref 39.0–52.0)
HEMOGLOBIN: 11.9 g/dL — AB (ref 13.0–17.0)
HEMOGLOBIN: 10.5 g/dL — AB (ref 13.0–17.0)
Hemoglobin: 10.5 g/dL — ABNORMAL LOW (ref 13.0–17.0)
Hemoglobin: 11.2 g/dL — ABNORMAL LOW (ref 13.0–17.0)
Hemoglobin: 11.2 g/dL — ABNORMAL LOW (ref 13.0–17.0)
Hemoglobin: 11.6 g/dL — ABNORMAL LOW (ref 13.0–17.0)
O2 Saturation: 100 %
O2 Saturation: 100 %
O2 Saturation: 95 %
O2 Saturation: 96 %
O2 Saturation: 96 %
O2 Saturation: 91 %
PCO2 ART: 46.1 mmHg (ref 32.0–48.0)
PCO2 ART: 40.2 mmHg (ref 32.0–48.0)
PH ART: 7.319 — AB (ref 7.350–7.450)
PO2 ART: 244 mmHg — AB (ref 83.0–108.0)
POTASSIUM: 5.2 mmol/L — AB (ref 3.5–5.1)
Patient temperature: 36
Patient temperature: 36.5
Patient temperature: 36.2
Potassium: 3.9 mmol/L (ref 3.5–5.1)
Potassium: 4.1 mmol/L (ref 3.5–5.1)
Potassium: 4.6 mmol/L (ref 3.5–5.1)
Potassium: 4.3 mmol/L (ref 3.5–5.1)
Potassium: 5 mmol/L (ref 3.5–5.1)
SODIUM: 141 mmol/L (ref 135–145)
Sodium: 143 mmol/L (ref 135–145)
Sodium: 144 mmol/L (ref 135–145)
Sodium: 142 mmol/L (ref 135–145)
Sodium: 141 mmol/L (ref 135–145)
Sodium: 142 mmol/L (ref 135–145)
TCO2: 23 mmol/L (ref 22–32)
TCO2: 24 mmol/L (ref 22–32)
TCO2: 20 mmol/L — ABNORMAL LOW (ref 22–32)
TCO2: 22 mmol/L (ref 22–32)
TCO2: 21 mmol/L — ABNORMAL LOW (ref 22–32)
TCO2: 22 mmol/L (ref 22–32)
pCO2 arterial: 40.3 mmHg (ref 32.0–48.0)
pCO2 arterial: 35.9 mmHg (ref 32.0–48.0)
pCO2 arterial: 39 mmHg (ref 32.0–48.0)
pCO2 arterial: 38.2 mmHg (ref 32.0–48.0)
pH, Arterial: 7.276 — ABNORMAL LOW (ref 7.350–7.450)
pH, Arterial: 7.361 (ref 7.350–7.450)
pH, Arterial: 7.334 — ABNORMAL LOW (ref 7.350–7.450)
pH, Arterial: 7.312 — ABNORMAL LOW (ref 7.350–7.450)
pH, Arterial: 7.346 — ABNORMAL LOW (ref 7.350–7.450)
pO2, Arterial: 337 mmHg — ABNORMAL HIGH (ref 83.0–108.0)
pO2, Arterial: 81 mmHg — ABNORMAL LOW (ref 83.0–108.0)
pO2, Arterial: 88 mmHg (ref 83.0–108.0)
pO2, Arterial: 84 mmHg (ref 83.0–108.0)
pO2, Arterial: 61 mmHg — ABNORMAL LOW (ref 83.0–108.0)

## 2018-11-26 LAB — POCT I-STAT 4, (NA,K, GLUC, HGB,HCT)
GLUCOSE: 108 mg/dL — AB (ref 70–99)
Glucose, Bld: 115 mg/dL — ABNORMAL HIGH (ref 70–99)
Glucose, Bld: 102 mg/dL — ABNORMAL HIGH (ref 70–99)
Glucose, Bld: 86 mg/dL (ref 70–99)
Glucose, Bld: 128 mg/dL — ABNORMAL HIGH (ref 70–99)
HCT: 33 % — ABNORMAL LOW (ref 39.0–52.0)
HCT: 27 % — ABNORMAL LOW (ref 39.0–52.0)
HCT: 32 % — ABNORMAL LOW (ref 39.0–52.0)
HCT: 27 % — ABNORMAL LOW (ref 39.0–52.0)
HCT: 29 % — ABNORMAL LOW (ref 39.0–52.0)
HEMOGLOBIN: 10.9 g/dL — AB (ref 13.0–17.0)
Hemoglobin: 11.2 g/dL — ABNORMAL LOW (ref 13.0–17.0)
Hemoglobin: 9.2 g/dL — ABNORMAL LOW (ref 13.0–17.0)
Hemoglobin: 9.2 g/dL — ABNORMAL LOW (ref 13.0–17.0)
Hemoglobin: 9.9 g/dL — ABNORMAL LOW (ref 13.0–17.0)
POTASSIUM: 4.5 mmol/L (ref 3.5–5.1)
POTASSIUM: 4.6 mmol/L (ref 3.5–5.1)
Potassium: 3.8 mmol/L (ref 3.5–5.1)
Potassium: 4 mmol/L (ref 3.5–5.1)
Potassium: 4 mmol/L (ref 3.5–5.1)
SODIUM: 142 mmol/L (ref 135–145)
SODIUM: 142 mmol/L (ref 135–145)
Sodium: 143 mmol/L (ref 135–145)
Sodium: 143 mmol/L (ref 135–145)
Sodium: 140 mmol/L (ref 135–145)

## 2018-11-26 LAB — CBC
HCT: 38.7 % — ABNORMAL LOW (ref 39.0–52.0)
HCT: 38.1 % — ABNORMAL LOW (ref 39.0–52.0)
HCT: 39.4 % (ref 39.0–52.0)
Hemoglobin: 12.5 g/dL — ABNORMAL LOW (ref 13.0–17.0)
Hemoglobin: 11.9 g/dL — ABNORMAL LOW (ref 13.0–17.0)
Hemoglobin: 12.2 g/dL — ABNORMAL LOW (ref 13.0–17.0)
MCH: 27.8 pg (ref 26.0–34.0)
MCH: 26.9 pg (ref 26.0–34.0)
MCH: 26.8 pg (ref 26.0–34.0)
MCHC: 32.3 g/dL (ref 30.0–36.0)
MCHC: 31.2 g/dL (ref 30.0–36.0)
MCHC: 31 g/dL (ref 30.0–36.0)
MCV: 86 fL (ref 80.0–100.0)
MCV: 86 fL (ref 80.0–100.0)
MCV: 86.4 fL (ref 80.0–100.0)
PLATELETS: 233 10*3/uL (ref 150–400)
Platelets: 179 10*3/uL (ref 150–400)
Platelets: 191 10*3/uL (ref 150–400)
RBC: 4.5 MIL/uL (ref 4.22–5.81)
RBC: 4.43 MIL/uL (ref 4.22–5.81)
RBC: 4.56 MIL/uL (ref 4.22–5.81)
RDW: 14.3 % (ref 11.5–15.5)
RDW: 14.4 % (ref 11.5–15.5)
RDW: 14.5 % (ref 11.5–15.5)
WBC: 8.3 10*3/uL (ref 4.0–10.5)
WBC: 12.9 10*3/uL — ABNORMAL HIGH (ref 4.0–10.5)
WBC: 14.4 10*3/uL — ABNORMAL HIGH (ref 4.0–10.5)
nRBC: 0 % (ref 0.0–0.2)
nRBC: 0 % (ref 0.0–0.2)
nRBC: 0 % (ref 0.0–0.2)

## 2018-11-26 LAB — URINALYSIS, ROUTINE W REFLEX MICROSCOPIC
Bilirubin Urine: NEGATIVE
Glucose, UA: 150 mg/dL — AB
Hgb urine dipstick: NEGATIVE
Ketones, ur: NEGATIVE mg/dL
Leukocytes, UA: NEGATIVE
Nitrite: NEGATIVE
PH: 5 (ref 5.0–8.0)
Protein, ur: >=300 mg/dL — AB
Specific Gravity, Urine: 1.017 (ref 1.005–1.030)

## 2018-11-26 LAB — PLATELET COUNT: Platelets: 156 10*3/uL (ref 150–400)

## 2018-11-26 LAB — GLUCOSE, CAPILLARY
Glucose-Capillary: 114 mg/dL — ABNORMAL HIGH (ref 70–99)
Glucose-Capillary: 114 mg/dL — ABNORMAL HIGH (ref 70–99)
Glucose-Capillary: 123 mg/dL — ABNORMAL HIGH (ref 70–99)
Glucose-Capillary: 135 mg/dL — ABNORMAL HIGH (ref 70–99)
Glucose-Capillary: 125 mg/dL — ABNORMAL HIGH (ref 70–99)
Glucose-Capillary: 122 mg/dL — ABNORMAL HIGH (ref 70–99)
Glucose-Capillary: 130 mg/dL — ABNORMAL HIGH (ref 70–99)
Glucose-Capillary: 138 mg/dL — ABNORMAL HIGH (ref 70–99)
Glucose-Capillary: 127 mg/dL — ABNORMAL HIGH (ref 70–99)
Glucose-Capillary: 154 mg/dL — ABNORMAL HIGH (ref 70–99)
Glucose-Capillary: 134 mg/dL — ABNORMAL HIGH (ref 70–99)
Glucose-Capillary: 144 mg/dL — ABNORMAL HIGH (ref 70–99)

## 2018-11-26 LAB — PROTIME-INR
INR: 1.08
INR: 1.26
Prothrombin Time: 13.9 seconds (ref 11.4–15.2)
Prothrombin Time: 15.7 seconds — ABNORMAL HIGH (ref 11.4–15.2)

## 2018-11-26 LAB — CREATININE, SERUM
Creatinine, Ser: 1.79 mg/dL — ABNORMAL HIGH (ref 0.61–1.24)
GFR calc Af Amer: 42 mL/min — ABNORMAL LOW (ref >60)
GFR calc non Af Amer: 36 mL/min — ABNORMAL LOW (ref >60)

## 2018-11-26 LAB — SURGICAL PCR SCREEN
MRSA, PCR: NEGATIVE
Staphylococcus aureus: NEGATIVE

## 2018-11-26 LAB — HEMOGLOBIN AND HEMATOCRIT, BLOOD
HCT: 29.2 % — ABNORMAL LOW (ref 39.0–52.0)
Hemoglobin: 9.3 g/dL — ABNORMAL LOW (ref 13.0–17.0)

## 2018-11-26 LAB — MAGNESIUM: Magnesium: 3.3 mg/dL — ABNORMAL HIGH (ref 1.7–2.4)

## 2018-11-26 LAB — HEMOGLOBIN A1C
Hgb A1c MFr Bld: 7.1 % — ABNORMAL HIGH (ref 4.8–5.6)
Mean Plasma Glucose: 157.07 mg/dL

## 2018-11-26 LAB — ABO/RH: ABO/RH(D): O POS

## 2018-11-26 LAB — APTT
aPTT: 36 seconds (ref 24–36)
aPTT: 39 seconds — ABNORMAL HIGH (ref 24–36)

## 2018-11-26 SURGERY — CORONARY ARTERY BYPASS GRAFTING (CABG)
Anesthesia: General | Site: Chest

## 2018-11-26 MED ORDER — PROTAMINE SULFATE 10 MG/ML IV SOLN
INTRAVENOUS | Status: DC | PRN
  Administered 2018-11-26: 20 mg via INTRAVENOUS
  Administered 2018-11-26: 340 mg via INTRAVENOUS

## 2018-11-26 MED ORDER — ARTIFICIAL TEARS OPHTHALMIC OINT
TOPICAL_OINTMENT | OPHTHALMIC | Status: DC | PRN
  Administered 2018-11-26: 1 via OPHTHALMIC

## 2018-11-26 MED ORDER — FENTANYL CITRATE (PF) 250 MCG/5ML IJ SOLN
INTRAMUSCULAR | Status: AC
  Filled 2018-11-26: qty 25

## 2018-11-26 MED ORDER — PROPOFOL 10 MG/ML IV BOLUS
INTRAVENOUS | Status: DC | PRN
  Administered 2018-11-26: 50 mg via INTRAVENOUS
  Administered 2018-11-26 (×2): 10 mg via INTRAVENOUS

## 2018-11-26 MED ORDER — THROMBIN (RECOMBINANT) 20000 UNITS EX SOLR
CUTANEOUS | Status: AC
  Filled 2018-11-26: qty 20000

## 2018-11-26 MED ORDER — SODIUM CHLORIDE 0.45 % IV SOLN: INTRAVENOUS | Status: DC | PRN

## 2018-11-26 MED ORDER — MIDAZOLAM HCL 5 MG/5ML IJ SOLN
INTRAMUSCULAR | Status: DC | PRN
  Administered 2018-11-26: 2 mg via INTRAVENOUS
  Administered 2018-11-26 (×2): 1 mg via INTRAVENOUS
  Administered 2018-11-26: 2 mg via INTRAVENOUS
  Administered 2018-11-26 (×2): 1 mg via INTRAVENOUS

## 2018-11-26 MED ORDER — TRANEXAMIC ACID 1000 MG/10ML IV SOLN
INTRAVENOUS | Status: DC | PRN
  Administered 2018-11-26: 1.5 mg/kg/h via INTRAVENOUS

## 2018-11-26 MED ORDER — ROCURONIUM BROMIDE 50 MG/5ML IV SOSY
PREFILLED_SYRINGE | INTRAVENOUS | Status: AC
  Filled 2018-11-26: qty 15

## 2018-11-26 MED ORDER — DEXMEDETOMIDINE HCL IN NACL 200 MCG/50ML IV SOLN
0.0000 ug/kg/h | INTRAVENOUS | Status: DC
  Administered 2018-11-26: 0.7 ug/kg/h via INTRAVENOUS
  Filled 2018-11-26: qty 50

## 2018-11-26 MED ORDER — CHLORHEXIDINE GLUCONATE 0.12 % MT SOLN
15.0000 mL | OROMUCOSAL | Status: AC
  Administered 2018-11-26: 15 mL via OROMUCOSAL

## 2018-11-26 MED ORDER — ACETAMINOPHEN 500 MG PO TABS
1000.0000 mg | ORAL_TABLET | Freq: Four times a day (QID) | ORAL | Status: AC
  Administered 2018-11-26 – 2018-12-01 (×11): 1000 mg via ORAL
  Filled 2018-11-26 (×11): qty 2

## 2018-11-26 MED ORDER — NITROGLYCERIN IN D5W 200-5 MCG/ML-% IV SOLN
0.0000 ug/min | INTRAVENOUS | Status: DC
  Filled 2018-11-26: qty 250

## 2018-11-26 MED ORDER — LACTATED RINGERS IV SOLN: INTRAVENOUS | Status: DC

## 2018-11-26 MED ORDER — ACETAMINOPHEN 650 MG RE SUPP
650.0000 mg | Freq: Once | RECTAL | Status: AC
  Administered 2018-11-26: 650 mg via RECTAL

## 2018-11-26 MED ORDER — THROMBIN 20000 UNITS EX SOLR
CUTANEOUS | Status: DC | PRN
  Administered 2018-11-26: 20000 [IU] via TOPICAL

## 2018-11-26 MED ORDER — ASPIRIN EC 325 MG PO TBEC
325.0000 mg | DELAYED_RELEASE_TABLET | Freq: Every day | ORAL | Status: DC
  Administered 2018-11-27 – 2018-12-02 (×6): 325 mg via ORAL
  Filled 2018-11-26 (×6): qty 1

## 2018-11-26 MED ORDER — SODIUM CHLORIDE 0.9 % IV SOLN
1.5000 g | Freq: Two times a day (BID) | INTRAVENOUS | Status: AC
  Administered 2018-11-26 – 2018-11-28 (×4): 1.5 g via INTRAVENOUS
  Filled 2018-11-26 (×4): qty 1.5

## 2018-11-26 MED ORDER — METOPROLOL TARTRATE 25 MG/10 ML ORAL SUSPENSION: 12.5000 mg | Freq: Two times a day (BID) | ORAL | Status: DC

## 2018-11-26 MED ORDER — SODIUM CHLORIDE (PF) 0.9 % IJ SOLN
INTRAMUSCULAR | Status: AC
  Filled 2018-11-26: qty 20

## 2018-11-26 MED ORDER — MAGNESIUM SULFATE 4 GM/100ML IV SOLN
4.0000 g | Freq: Once | INTRAVENOUS | Status: AC
  Administered 2018-11-26: 4 g via INTRAVENOUS
  Filled 2018-11-26: qty 100

## 2018-11-26 MED ORDER — METOPROLOL TARTRATE 5 MG/5ML IV SOLN
2.5000 mg | INTRAVENOUS | Status: DC | PRN
  Administered 2018-11-26 – 2018-11-28 (×3): 5 mg via INTRAVENOUS
  Administered 2018-11-28: 2.5 mg via INTRAVENOUS
  Filled 2018-11-26 (×3): qty 5

## 2018-11-26 MED ORDER — NITROGLYCERIN 0.2 MG/ML ON CALL CATH LAB
INTRAVENOUS | Status: DC | PRN
  Administered 2018-11-26: 20 ug via INTRAVENOUS

## 2018-11-26 MED ORDER — ROCURONIUM BROMIDE 50 MG/5ML IV SOSY
PREFILLED_SYRINGE | INTRAVENOUS | Status: AC
  Filled 2018-11-26: qty 5

## 2018-11-26 MED ORDER — METOPROLOL TARTRATE 12.5 MG HALF TABLET
12.5000 mg | ORAL_TABLET | Freq: Two times a day (BID) | ORAL | Status: DC
  Administered 2018-11-27 (×2): 12.5 mg via ORAL
  Filled 2018-11-26 (×2): qty 1

## 2018-11-26 MED ORDER — SODIUM BICARBONATE 8.4 % IV SOLN
50.0000 meq | Freq: Once | INTRAVENOUS | Status: AC
  Administered 2018-11-26: 50 meq via INTRAVENOUS

## 2018-11-26 MED ORDER — MIDAZOLAM HCL 2 MG/2ML IJ SOLN
2.0000 mg | INTRAMUSCULAR | Status: DC | PRN
  Administered 2018-11-26: 2 mg via INTRAVENOUS
  Filled 2018-11-26: qty 2

## 2018-11-26 MED ORDER — ACETAMINOPHEN 160 MG/5ML PO SOLN: 1000.0000 mg | Freq: Four times a day (QID) | ORAL | Status: AC

## 2018-11-26 MED ORDER — HEPARIN SODIUM (PORCINE) 1000 UNIT/ML IJ SOLN
INTRAMUSCULAR | Status: AC
  Filled 2018-11-26: qty 1

## 2018-11-26 MED ORDER — ASPIRIN 81 MG PO CHEW: 324.0000 mg | CHEWABLE_TABLET | Freq: Every day | ORAL | Status: DC

## 2018-11-26 MED ORDER — SODIUM CHLORIDE 0.9 % IV SOLN
INTRAVENOUS | Status: DC
  Administered 2018-11-26: 14:00:00 via INTRAVENOUS

## 2018-11-26 MED ORDER — SODIUM CHLORIDE 0.9 % IV SOLN
INTRAVENOUS | Status: DC | PRN
  Administered 2018-11-26: 30 ug/min via INTRAVENOUS

## 2018-11-26 MED ORDER — ONDANSETRON HCL 4 MG/2ML IJ SOLN
4.0000 mg | Freq: Four times a day (QID) | INTRAMUSCULAR | Status: DC | PRN
  Administered 2018-11-26 – 2018-11-27 (×3): 4 mg via INTRAVENOUS
  Filled 2018-11-26 (×3): qty 2

## 2018-11-26 MED ORDER — THROMBIN 20000 UNITS EX SOLR
OROMUCOSAL | Status: DC | PRN
  Administered 2018-11-26 (×3): 4 mL via TOPICAL

## 2018-11-26 MED ORDER — SODIUM CHLORIDE 0.9 % IV SOLN: 250.0000 mL | INTRAVENOUS | Status: DC

## 2018-11-26 MED ORDER — PLASMA-LYTE 148 IV SOLN
INTRAVENOUS | Status: DC | PRN
  Administered 2018-11-26: 500 mL via INTRAVASCULAR

## 2018-11-26 MED ORDER — LACTATED RINGERS IV SOLN
INTRAVENOUS | Status: DC | PRN
  Administered 2018-11-26 (×2): via INTRAVENOUS

## 2018-11-26 MED ORDER — PROTAMINE SULFATE 10 MG/ML IV SOLN
INTRAVENOUS | Status: AC
  Filled 2018-11-26: qty 10

## 2018-11-26 MED ORDER — ROCURONIUM BROMIDE 10 MG/ML (PF) SYRINGE
PREFILLED_SYRINGE | INTRAVENOUS | Status: DC | PRN
  Administered 2018-11-26 (×4): 50 mg via INTRAVENOUS

## 2018-11-26 MED ORDER — LACTATED RINGERS IV SOLN
INTRAVENOUS | Status: DC | PRN
  Administered 2018-11-26: 07:00:00 via INTRAVENOUS

## 2018-11-26 MED ORDER — HEMOSTATIC AGENTS (NO CHARGE) OPTIME
TOPICAL | Status: DC | PRN
  Administered 2018-11-26: 1 via TOPICAL

## 2018-11-26 MED ORDER — PHENYLEPHRINE HCL-NACL 20-0.9 MG/250ML-% IV SOLN: 0.0000 ug/min | INTRAVENOUS | Status: DC

## 2018-11-26 MED ORDER — HEPARIN SODIUM (PORCINE) 1000 UNIT/ML IJ SOLN
INTRAMUSCULAR | Status: DC | PRN
  Administered 2018-11-26: 36000 [IU] via INTRAVENOUS

## 2018-11-26 MED ORDER — TRAMADOL HCL 50 MG PO TABS: 50.0000 mg | ORAL_TABLET | ORAL | Status: DC | PRN

## 2018-11-26 MED ORDER — VANCOMYCIN HCL IN DEXTROSE 1-5 GM/200ML-% IV SOLN
1000.0000 mg | Freq: Once | INTRAVENOUS | Status: AC
  Administered 2018-11-26: 1000 mg via INTRAVENOUS
  Filled 2018-11-26: qty 200

## 2018-11-26 MED ORDER — POTASSIUM CHLORIDE 10 MEQ/50ML IV SOLN: 10.0000 meq | INTRAVENOUS | Status: AC

## 2018-11-26 MED ORDER — BISACODYL 5 MG PO TBEC
10.0000 mg | DELAYED_RELEASE_TABLET | Freq: Every day | ORAL | Status: DC
  Administered 2018-11-27 – 2018-12-02 (×4): 10 mg via ORAL
  Filled 2018-11-26 (×4): qty 2

## 2018-11-26 MED ORDER — MIDAZOLAM HCL (PF) 10 MG/2ML IJ SOLN
INTRAMUSCULAR | Status: AC
  Filled 2018-11-26: qty 2

## 2018-11-26 MED ORDER — PHENYLEPHRINE 40 MCG/ML (10ML) SYRINGE FOR IV PUSH (FOR BLOOD PRESSURE SUPPORT)
PREFILLED_SYRINGE | INTRAVENOUS | Status: AC
  Filled 2018-11-26: qty 10

## 2018-11-26 MED ORDER — PROTAMINE SULFATE 10 MG/ML IV SOLN
INTRAVENOUS | Status: AC
  Filled 2018-11-26: qty 25

## 2018-11-26 MED ORDER — PROPOFOL 10 MG/ML IV BOLUS
INTRAVENOUS | Status: AC
  Filled 2018-11-26: qty 20

## 2018-11-26 MED ORDER — 0.9 % SODIUM CHLORIDE (POUR BTL) OPTIME
TOPICAL | Status: DC | PRN
  Administered 2018-11-26: 5000 mL

## 2018-11-26 MED ORDER — INSULIN REGULAR BOLUS VIA INFUSION
0.0000 [IU] | Freq: Three times a day (TID) | INTRAVENOUS | Status: DC
  Filled 2018-11-26: qty 10

## 2018-11-26 MED ORDER — EPHEDRINE SULFATE-NACL 50-0.9 MG/10ML-% IV SOSY
PREFILLED_SYRINGE | INTRAVENOUS | Status: DC | PRN
  Administered 2018-11-26 (×2): 5 mg via INTRAVENOUS
  Administered 2018-11-26: 10 mg via INTRAVENOUS

## 2018-11-26 MED ORDER — ARTIFICIAL TEARS OPHTHALMIC OINT
TOPICAL_OINTMENT | OPHTHALMIC | Status: AC
  Filled 2018-11-26: qty 3.5

## 2018-11-26 MED ORDER — INSULIN REGULAR(HUMAN) IN NACL 100-0.9 UT/100ML-% IV SOLN: INTRAVENOUS | Status: DC

## 2018-11-26 MED ORDER — LACTATED RINGERS IV SOLN: 500.0000 mL | Freq: Once | INTRAVENOUS | Status: DC | PRN

## 2018-11-26 MED ORDER — FAMOTIDINE IN NACL 20-0.9 MG/50ML-% IV SOLN
20.0000 mg | Freq: Two times a day (BID) | INTRAVENOUS | Status: AC
  Administered 2018-11-26: 20 mg via INTRAVENOUS

## 2018-11-26 MED ORDER — ALBUMIN HUMAN 5 % IV SOLN
250.0000 mL | INTRAVENOUS | Status: DC | PRN
  Administered 2018-11-26: 12.5 g via INTRAVENOUS

## 2018-11-26 MED ORDER — BISACODYL 10 MG RE SUPP: 10.0000 mg | Freq: Every day | RECTAL | Status: DC

## 2018-11-26 MED ORDER — SODIUM CHLORIDE 0.9% FLUSH: 3.0000 mL | INTRAVENOUS | Status: DC | PRN

## 2018-11-26 MED ORDER — PHENYLEPHRINE 40 MCG/ML (10ML) SYRINGE FOR IV PUSH (FOR BLOOD PRESSURE SUPPORT)
PREFILLED_SYRINGE | INTRAVENOUS | Status: DC | PRN
  Administered 2018-11-26 (×4): 40 ug via INTRAVENOUS

## 2018-11-26 MED ORDER — OXYCODONE HCL 5 MG PO TABS
5.0000 mg | ORAL_TABLET | ORAL | Status: DC | PRN
  Administered 2018-11-28: 5 mg via ORAL
  Filled 2018-11-26: qty 1

## 2018-11-26 MED ORDER — PANTOPRAZOLE SODIUM 40 MG PO TBEC
40.0000 mg | DELAYED_RELEASE_TABLET | Freq: Every day | ORAL | Status: DC
  Administered 2018-11-28 – 2018-12-02 (×5): 40 mg via ORAL
  Filled 2018-11-26 (×5): qty 1

## 2018-11-26 MED ORDER — SODIUM CHLORIDE 0.9 % IV SOLN
INTRAVENOUS | Status: DC | PRN
  Administered 2018-11-26: 1 [IU]/h via INTRAVENOUS

## 2018-11-26 MED ORDER — SODIUM CHLORIDE 0.9% FLUSH
3.0000 mL | Freq: Two times a day (BID) | INTRAVENOUS | Status: DC
  Administered 2018-11-27 (×2): 3 mL via INTRAVENOUS

## 2018-11-26 MED ORDER — ACETAMINOPHEN 160 MG/5ML PO SOLN: 650.0000 mg | Freq: Once | ORAL | Status: AC

## 2018-11-26 MED ORDER — EPHEDRINE 5 MG/ML INJ
INTRAVENOUS | Status: AC
  Filled 2018-11-26: qty 10

## 2018-11-26 MED ORDER — MORPHINE SULFATE (PF) 2 MG/ML IV SOLN
1.0000 mg | INTRAVENOUS | Status: DC | PRN
  Administered 2018-11-26: 4 mg via INTRAVENOUS
  Administered 2018-11-26 – 2018-11-27 (×3): 2 mg via INTRAVENOUS
  Filled 2018-11-26: qty 1
  Filled 2018-11-26: qty 2
  Filled 2018-11-26 (×2): qty 1

## 2018-11-26 MED ORDER — VANCOMYCIN HCL 1000 MG IV SOLR
INTRAVENOUS | Status: DC | PRN
  Administered 2018-11-26: 1000 mg via INTRAVENOUS

## 2018-11-26 MED ORDER — FENTANYL CITRATE (PF) 250 MCG/5ML IJ SOLN
INTRAMUSCULAR | Status: DC | PRN
  Administered 2018-11-26: 100 ug via INTRAVENOUS
  Administered 2018-11-26: 50 ug via INTRAVENOUS
  Administered 2018-11-26: 600 ug via INTRAVENOUS
  Administered 2018-11-26: 100 ug via INTRAVENOUS
  Administered 2018-11-26 (×2): 50 ug via INTRAVENOUS
  Administered 2018-11-26: 100 ug via INTRAVENOUS
  Administered 2018-11-26: 50 ug via INTRAVENOUS
  Administered 2018-11-26: 100 ug via INTRAVENOUS
  Administered 2018-11-26: 50 ug via INTRAVENOUS

## 2018-11-26 MED ORDER — DOCUSATE SODIUM 100 MG PO CAPS
200.0000 mg | ORAL_CAPSULE | Freq: Every day | ORAL | Status: DC
  Administered 2018-11-27 – 2018-11-28 (×2): 200 mg via ORAL
  Filled 2018-11-26 (×3): qty 2

## 2018-11-26 SURGICAL SUPPLY — 77 items
ADH SKN CLS APL DERMABOND .7 (GAUZE/BANDAGES/DRESSINGS) ×2
BAG DECANTER FOR FLEXI CONT (MISCELLANEOUS) ×4 IMPLANT
BANDAGE ACE 4X5 VEL STRL LF (GAUZE/BANDAGES/DRESSINGS) ×4 IMPLANT
BANDAGE ACE 6X5 VEL STRL LF (GAUZE/BANDAGES/DRESSINGS) ×4 IMPLANT
BANDAGE ELASTIC 4 VELCRO ST LF (GAUZE/BANDAGES/DRESSINGS) ×2 IMPLANT
BANDAGE ELASTIC 6 VELCRO ST LF (GAUZE/BANDAGES/DRESSINGS) ×2 IMPLANT
BASKET HEART  (ORDER IN 25'S) (MISCELLANEOUS) ×1
BASKET HEART (ORDER IN 25'S) (MISCELLANEOUS) ×1
BASKET HEART (ORDER IN 25S) (MISCELLANEOUS) ×2 IMPLANT
BLADE STERNUM SYSTEM 6 (BLADE) ×4 IMPLANT
BLADE SURG 11 STRL SS (BLADE) ×2 IMPLANT
BNDG GAUZE ELAST 4 BULKY (GAUZE/BANDAGES/DRESSINGS) ×4 IMPLANT
CANISTER SUCT 3000ML PPV (MISCELLANEOUS) ×4 IMPLANT
CATH ROBINSON RED A/P 18FR (CATHETERS) ×8 IMPLANT
CATH THORACIC 28FR (CATHETERS) ×4 IMPLANT
CATH THORACIC 36FR (CATHETERS) ×4 IMPLANT
CATH THORACIC 36FR RT ANG (CATHETERS) ×4 IMPLANT
CLIP VESOCCLUDE SM WIDE 24/CT (CLIP) ×2 IMPLANT
CRADLE DONUT ADULT HEAD (MISCELLANEOUS) ×4 IMPLANT
DERMABOND ADVANCED (GAUZE/BANDAGES/DRESSINGS) ×2
DERMABOND ADVANCED .7 DNX12 (GAUZE/BANDAGES/DRESSINGS) IMPLANT
DRAPE CARDIOVASCULAR INCISE (DRAPES) ×4
DRAPE SLUSH/WARMER DISC (DRAPES) ×4 IMPLANT
DRAPE SRG 135X102X78XABS (DRAPES) ×2 IMPLANT
DRSG COVADERM 4X14 (GAUZE/BANDAGES/DRESSINGS) ×4 IMPLANT
ELECT BLADE 4.0 EZ CLEAN MEGAD (MISCELLANEOUS) ×4
ELECT CAUTERY BLADE 6.4 (BLADE) ×4 IMPLANT
ELECT REM PT RETURN 9FT ADLT (ELECTROSURGICAL) ×8
ELECTRODE BLDE 4.0 EZ CLN MEGD (MISCELLANEOUS) IMPLANT
ELECTRODE REM PT RTRN 9FT ADLT (ELECTROSURGICAL) ×4 IMPLANT
FELT TEFLON 1X6 (MISCELLANEOUS) ×6 IMPLANT
GAUZE SPONGE 4X4 12PLY STRL (GAUZE/BANDAGES/DRESSINGS) ×8 IMPLANT
GAUZE SPONGE 4X4 12PLY STRL LF (GAUZE/BANDAGES/DRESSINGS) ×4 IMPLANT
GLOVE EUDERMIC 7 POWDERFREE (GLOVE) ×8 IMPLANT
GOWN STRL REUS W/ TWL LRG LVL3 (GOWN DISPOSABLE) ×8 IMPLANT
GOWN STRL REUS W/ TWL XL LVL3 (GOWN DISPOSABLE) ×2 IMPLANT
GOWN STRL REUS W/TWL LRG LVL3 (GOWN DISPOSABLE) ×16
GOWN STRL REUS W/TWL XL LVL3 (GOWN DISPOSABLE) ×4
HEMOSTAT POWDER SURGIFOAM 1G (HEMOSTASIS) ×12 IMPLANT
HEMOSTAT SURGICEL 2X14 (HEMOSTASIS) ×4 IMPLANT
INSERT FOGARTY XLG (MISCELLANEOUS) ×2 IMPLANT
KIT BASIN OR (CUSTOM PROCEDURE TRAY) ×4 IMPLANT
KIT CATH CPB BARTLE (MISCELLANEOUS) ×4 IMPLANT
KIT SUCTION CATH 14FR (SUCTIONS) ×4 IMPLANT
KIT TURNOVER KIT B (KITS) ×4 IMPLANT
KIT VASOVIEW HEMOPRO 2 VH 4000 (KITS) ×4 IMPLANT
LINE EXTENSION DELIVERY (MISCELLANEOUS) ×2 IMPLANT
NS IRRIG 1000ML POUR BTL (IV SOLUTION) ×20 IMPLANT
PACK E OPEN HEART (SUTURE) ×4 IMPLANT
PACK OPEN HEART (CUSTOM PROCEDURE TRAY) ×4 IMPLANT
PAD ARMBOARD 7.5X6 YLW CONV (MISCELLANEOUS) ×8 IMPLANT
PAD ELECT DEFIB RADIOL ZOLL (MISCELLANEOUS) ×4 IMPLANT
PENCIL BUTTON HOLSTER BLD 10FT (ELECTRODE) ×4 IMPLANT
PUNCH AORTIC ROTATE 4.5MM 8IN (MISCELLANEOUS) ×4 IMPLANT
SET CARDIOPLEGIA MPS 5001102 (MISCELLANEOUS) ×2 IMPLANT
SOLUTION ANTI FOG 6CC (MISCELLANEOUS) ×2 IMPLANT
SUT BONE WAX W31G (SUTURE) ×4 IMPLANT
SUT MNCRL AB 4-0 PS2 18 (SUTURE) ×2 IMPLANT
SUT PROLENE 3 0 SH1 36 (SUTURE) ×4 IMPLANT
SUT PROLENE 6 0 C 1 30 (SUTURE) ×4 IMPLANT
SUT PROLENE 7 0 BV1 MDA (SUTURE) ×4 IMPLANT
SUT SILK  1 MH (SUTURE) ×2
SUT SILK 1 MH (SUTURE) IMPLANT
SUT STEEL SZ 6 DBL 3X14 BALL (SUTURE) ×6 IMPLANT
SUT VIC AB 1 CTX 36 (SUTURE) ×12
SUT VIC AB 1 CTX36XBRD ANBCTR (SUTURE) ×4 IMPLANT
SUT VIC AB 2-0 CT1 27 (SUTURE) ×4
SUT VIC AB 2-0 CT1 TAPERPNT 27 (SUTURE) IMPLANT
SYSTEM SAHARA CHEST DRAIN ATS (WOUND CARE) ×4 IMPLANT
TABLE PACK (MISCELLANEOUS) ×2 IMPLANT
TAPE CLOTH SOFT 2X10 (GAUZE/BANDAGES/DRESSINGS) ×2 IMPLANT
TAPE CLOTH SURG 4X10 WHT LF (GAUZE/BANDAGES/DRESSINGS) ×2 IMPLANT
TOWEL GREEN STERILE (TOWEL DISPOSABLE) ×4 IMPLANT
TRAY FOLEY SLVR 16FR TEMP STAT (SET/KITS/TRAYS/PACK) ×4 IMPLANT
TUBING INSUFFLATION (TUBING) ×4 IMPLANT
UNDERPAD 30X30 (UNDERPADS AND DIAPERS) ×4 IMPLANT
WATER STERILE IRR 1000ML POUR (IV SOLUTION) ×8 IMPLANT

## 2018-11-26 NOTE — Progress Notes (Signed)
Patient ID: Jake Gross, male   DOB: 1940-11-30, 78 y.o.   MRN: [id number]  TCTS Evening Rounds:   Hemodynamically stable  CI = 2.14  Awake on vent. Weaning.  Urine output good  CT output low  BMET    Component Value Date/Time   NA 141 11/26/2018 1336   NA 147 (H) 11/14/2018 1211   K 4.3 11/26/2018 1336   CL 112 (H) 11/26/2018 0426   CO2 21 (L) 11/26/2018 0426   GLUCOSE 128 (H) 11/26/2018 1204   BUN 25 (H) 11/26/2018 0426   BUN 26 11/14/2018 1211   CREATININE 1.92 (H) 11/26/2018 0426   CREATININE 1.14 11/07/2016 1514   CALCIUM 8.7 (L) 11/26/2018 0426   GFRNONAA 33 (L) 11/26/2018 0426   GFRAA 39 (L) 11/26/2018 0426     A/P:  Stable postop course. Continue current plans

## 2018-11-26 NOTE — Anesthesia Procedure Notes (Signed)
Central Venous Catheter Insertion Performed by: Suzette Battiest, MD, anesthesiologist Start/End2/08/2019 6:35 AM, 11/26/2018 6:50 AM Patient location: Pre-op. Preanesthetic checklist: patient identified, IV checked, site marked, risks and benefits discussed, surgical consent, monitors and equipment checked, pre-op evaluation, timeout performed and anesthesia consent Hand hygiene performed  and maximum sterile barriers used  PA cath was placed.Swan type:thermodilution Procedure performed without using ultrasound guided technique. Attempts: 1 Patient tolerated the procedure well with no immediate complications.

## 2018-11-26 NOTE — Op Note (Signed)
CARDIOVASCULAR SURGERY OPERATIVE NOTE  11/26/2018  Surgeon:  Gaye Pollack, MD  First Assistant: Enid Cutter,  PA-C   Preoperative Diagnosis:  Severe multi-vessel coronary artery disease   Postoperative Diagnosis:  Same   Procedure:  1. Median Sternotomy 2. Extracorporeal circulation 3.   Coronary artery bypass grafting x 4   Left internal mammary graft to the LAD  SVG to D1  SVG to D2  SVG to PDA 4.   Endoscopic vein harvest from the left leg   Anesthesia:  General Endotracheal   Clinical History/Surgical Indication:  This 78 year old gentleman presents with recent onset of chest pressure and shortness of breath occurring while taking out his trash.  He has not had any symptoms at rest or just walking around his house but they have been occurring with any more exertion.  Cardiac catheterization shows 70% ostial left main stenosis, 95% proximal LAD stenosis, 80% ostial first diagonal stenosis, and 99% posterior descending stenosis.  LVEDP was elevated at 26.  A 2D echocardiogram today shows an ejection fraction of 60 to 65% with no mitral regurgitation or stenosis.  The aortic valve is trileaflet without stenosis.  He had a previous stroke in 2018 and recovered completely from that but has been on Plavix which has been discontinued in anticipation of surgery.  His last dose was on 11/20/2018.  I agree that coronary bypass graft surgery is the best treatment to prevent further ischemia and infarction and improve his quality of life. I discussed the operative procedure with the patient including alternatives, benefits and risks; including but not limited to bleeding, blood transfusion, infection, stroke, myocardial infarction, graft failure, heart block requiring a permanent pacemaker, organ dysfunction, and death.  [place] understands and agrees to  proceed.  Preparation:  The patient was seen in the preoperative holding area and the correct patient, correct operation were confirmed with the patient after reviewing the medical record and catheterization. The consent was signed by me. Preoperative antibiotics were given. A pulmonary arterial line and radial arterial line were placed by the anesthesia team. The patient was taken back to the operating room and positioned supine on the operating room table. After being placed under general endotracheal anesthesia by the anesthesia team a foley catheter was placed. The neck, chest, abdomen, and both legs were prepped with betadine soap and solution and draped in the usual sterile manner. A surgical time-out was taken and the correct patient and operative procedure were confirmed with the nursing and anesthesia staff.   Cardiopulmonary Bypass:  A median sternotomy was performed. The pericardium was opened in the midline. Right ventricular function appeared normal. The ascending aorta was of normal size and had no palpable plaque. There were no contraindications to aortic cannulation or cross-clamping. The patient was fully systemically heparinized and the ACT was maintained > 400 sec. The proximal aortic arch was cannulated with a 20 F aortic cannula for arterial inflow. Venous cannulation was performed via the right atrial appendage using a two-staged venous cannula. An antegrade cardioplegia/vent cannula was inserted into the mid-ascending aorta. Aortic occlusion was performed with a single cross-clamp. Systemic cooling to 32 degrees Centigrade and topical cooling of the heart with iced saline were used. Hyperkalemic antegrade cold blood cardioplegia was used to induce diastolic arrest and was then given at about 20 minute intervals throughout the period of arrest to maintain myocardial temperature at or below 10 degrees centigrade. A temperature probe was inserted into the interventricular septum and an  insulating pad  was placed in the pericardium.   Left internal mammary harvest:  The left side of the sternum was retracted using the Rultract retractor. The left internal mammary artery was harvested as a pedicle graft. All side branches were clipped. It was a medium-sized vessel of good quality with excellent blood flow. It was ligated distally and divided. It was sprayed with topical papaverine solution to prevent vasospasm.   Endoscopic vein harvest:  The left greater saphenous vein was harvested endoscopically through a 2 cm incision medial to the left knee. It was harvested from the upper thigh to below the knee. It was a medium-sized vein of good quality. The side branches were all ligated with 4-0 silk ties.    Coronary arteries:  The coronary arteries were examined.   LAD:  Moderate sized vessel with no distal disease. D1 and D2 both moderate sized with no distal disease.  LCX:  Small marginal branches that were not graftable.  RCA:  Large PDA that was diffusely diseased but graftable distally. There were two small PL branches that were not large enough to graft but supplied by collaterals from the left.   Grafts:  1. LIMA to the LAD: 1.6 mm. It was sewn end to side using 8-0 prolene continuous suture. 2. SVG to D1:  1.6 mm. It was sewn end to side using 7-0 prolene continuous suture. 3. SVG to D2:  1.6 mm. It was sewn end to side using 7-0 prolene continuous suture. 4. SVG to PDA:  1.6 mm. It was sewn end to side using 7-0 prolene continuous suture.  The proximal vein graft anastomoses were performed to the mid-ascending aorta using continuous 6-0 prolene suture. Graft markers were placed around the proximal anastomoses.   Completion:  The patient was rewarmed to 37 degrees Centigrade. The clamp was removed from the LIMA pedicle and there was rapid warming of the septum and return of ventricular fibrillation. The crossclamp was removed with a time of 84 minutes. There  was spontaneous return of sinus rhythm. The distal and proximal anastomoses were checked for hemostasis. The position of the grafts was satisfactory. Two temporary epicardial pacing wires were placed on the right atrium and two on the right ventricle. The patient was weaned from CPB without difficulty on no inotropes. CPB time was 102 minutes. Cardiac output was 5 LPM. TEE showed normal LV systolic function. Heparin was fully reversed with protamine and the aortic and venous cannulas removed. Hemostasis was achieved. Mediastinal and left pleural drainage tubes were placed. The sternum was closed with double #6 stainless steel wires. The fascia was closed with continuous # 1 vicryl suture. The subcutaneous tissue was closed with 2-0 vicryl continuous suture. The skin was closed with 3-0 vicryl subcuticular suture. All sponge, needle, and instrument counts were reported correct at the end of the case. Dry sterile dressings were placed over the incisions and around the chest tubes which were connected to pleurevac suction. The patient was then transported to the surgical intensive care unit in stable condition.

## 2018-11-26 NOTE — Anesthesia Preprocedure Evaluation (Addendum)
Anesthesia Evaluation  Patient identified by MRN, date of birth, ID band Patient awake    Reviewed: Allergy & Precautions, H&P , NPO status , Patient's Chart, lab work & pertinent test results  Airway Mallampati: II  TM Distance: >3 FB Neck ROM: Full    Dental no notable dental hx. (+) Edentulous Upper, Edentulous Lower, Dental Advisory Given   Pulmonary neg pulmonary ROS, former smoker,    Pulmonary exam normal breath sounds clear to auscultation       Cardiovascular hypertension, Pt. on medications and Pt. on home beta blockers  Rhythm:Regular Rate:Normal     Neuro/Psych CVA negative psych ROS   GI/Hepatic negative GI ROS, Neg liver ROS,   Endo/Other  diabetes, Type 1, Insulin Dependent  Renal/GU Renal InsufficiencyRenal disease  negative genitourinary   Musculoskeletal  (+) Arthritis , Osteoarthritis,    Abdominal   Peds  Hematology negative hematology ROS (+)   Anesthesia Other Findings   Reproductive/Obstetrics negative OB ROS                            Anesthesia Physical Anesthesia Plan  ASA: IV  Anesthesia Plan: General   Post-op Pain Management:    Induction: Intravenous  PONV Risk Score and Plan: 2 and Ondansetron, Midazolam and Treatment may vary due to age or medical condition  Airway Management Planned: Oral ETT  Additional Equipment: Arterial line, CVP, PA Cath, TEE and Ultrasound Guidance Line Placement  Intra-op Plan:   Post-operative Plan: Post-operative intubation/ventilation  Informed Consent: I have reviewed the patients History and Physical, chart, labs and discussed the procedure including the risks, benefits and alternatives for the proposed anesthesia with the patient or authorized representative who has indicated his/her understanding and acceptance.     Dental advisory given  Plan Discussed with: CRNA  Anesthesia Plan Comments:          Anesthesia Quick Evaluation

## 2018-11-26 NOTE — Progress Notes (Signed)
RT  Note- found ETT at 19-20, with low minute ventilation on ventilator. ETT advanced to 24, no leak and now VE and expiratory volume returned, RN at the bedside.

## 2018-11-26 NOTE — Brief Op Note (Addendum)
11/20/2018 - 11/26/2018  11:35 AM  PATIENT:  Jake Gross  78 y.o. male  PRE-OPERATIVE DIAGNOSIS:  CAD  POST-OPERATIVE DIAGNOSIS:  CAD  PROCEDURE:  Procedure(s): CORONARY ARTERY BYPASS GRAFTING (CABG), ON PUMP, TIMES FOUR, USING LEFT INTERNAL MAMMARY ARTERY AND ENDOSCOPICALLY HARVESTED LEFT SAPHENOUS VEIN   LIMA->LAD SVG->D1 SVG->D2 SVG->PDA  TRANSESOPHAGEAL ECHOCARDIOGRAM (TEE) (N/A)  SURGEON:  Surgeon(s) and Role:    * Bartle, Fernande Boyden, MD - Primary  PHYSICIAN ASSISTANT:   ASSISTANTS: M. Roddenberry, PA-C  ANESTHESIA:   local  EBL:  Per anesthesia and perfusion records   BLOOD ADMINISTERED:none  DRAINS: Left pleural and mediastinal tubes.   LOCAL MEDICATIONS USED:  NONE  SPECIMEN:  No Specimen  DISPOSITION OF SPECIMEN:  N/A  COUNTS:  YES  DICTATION: .Dragon Dictation  PLAN OF CARE: Admit to inpatient   PATIENT DISPOSITION:  ICU - intubated and hemodynamically stable.   Delay start of Pharmacological VTE agent (>24hrs) due to surgical blood loss or risk of bleeding: yes

## 2018-11-26 NOTE — Progress Notes (Signed)
  Echocardiogram Echocardiogram Transesophageal has been performed.  Jake Gross 11/26/2018, 10:42 AM

## 2018-11-26 NOTE — Anesthesia Procedure Notes (Signed)
Central Venous Catheter Insertion Performed by: Suzette Battiest, MD, anesthesiologist Start/End2/08/2019 6:35 AM, 11/26/2018 6:50 AM Patient location: Pre-op. Preanesthetic checklist: patient identified, IV checked, site marked, risks and benefits discussed, surgical consent, monitors and equipment checked, pre-op evaluation, timeout performed and anesthesia consent Position: Trendelenburg Lidocaine 1% used for infiltration and patient sedated Hand hygiene performed , maximum sterile barriers used  and Seldinger technique used Catheter size: 8.5 Fr Total catheter length 10. Central line and PA cath was placed.Sheath introducer Swan type:thermodilution PA Cath depth:50 Procedure performed using ultrasound guided technique. Ultrasound Notes:anatomy identified, needle tip was noted to be adjacent to the nerve/plexus identified, no ultrasound evidence of intravascular and/or intraneural injection and image(s) printed for medical record Attempts: 1 Following insertion, line sutured, dressing applied and Biopatch. Post procedure assessment: blood return through all ports, free fluid flow and no air  Patient tolerated the procedure well with no immediate complications.

## 2018-11-26 NOTE — Anesthesia Postprocedure Evaluation (Signed)
Anesthesia Post Note  Patient: [place]  Procedure(s) Performed: CORONARY ARTERY BYPASS GRAFTING (CABG), ON PUMP, TIMES FOUR, USING LEFT INTERNAL MAMMARY ARTERY AND ENDOSCOPICALLY HARVESTED LEFT SAPHENOUS VEIN (N/A Chest) TRANSESOPHAGEAL ECHOCARDIOGRAM (TEE) (N/A )     Anesthesia Post Evaluation  Last Vitals:  Vitals:   11/26/18 1345 11/26/18 1400  BP:    Pulse:    Resp: 12 12  Temp: (!) 36 C (!) 36 C  SpO2: 99% 99%    Last Pain:  Vitals:   11/26/18 0530  TempSrc: Oral  PainSc:                  Odessia Asleson,W. EDMOND

## 2018-11-26 NOTE — Transfer of Care (Signed)
Immediate Anesthesia Transfer of Care Note  Patient: [place]  Procedure(s) Performed: CORONARY ARTERY BYPASS GRAFTING (CABG), ON PUMP, TIMES FOUR, USING LEFT INTERNAL MAMMARY ARTERY AND ENDOSCOPICALLY HARVESTED LEFT SAPHENOUS VEIN (N/A Chest) TRANSESOPHAGEAL ECHOCARDIOGRAM (TEE) (N/A )  Patient Location: SICU  Anesthesia Type:General  Level of Consciousness: Patient remains intubated per anesthesia plan  Airway & Oxygen Therapy: Patient remains intubated per anesthesia plan and Patient placed on Ventilator (see vital sign flow sheet for setting)  Post-op Assessment: Report given to RN and Post -op Vital signs reviewed and stable  Post vital signs: Reviewed and stable  Last Vitals:  Vitals Value Taken Time  BP    Temp    Pulse    Resp    SpO2      Last Pain:  Vitals:   11/26/18 0530  TempSrc: Oral  PainSc:       Patients Stated Pain Goal: 3 (79/48/01 6553)  Complications: No apparent anesthesia complications

## 2018-11-26 NOTE — Anesthesia Procedure Notes (Signed)
Procedure Name: Intubation Date/Time: 11/26/2018 7:58 AM Performed by: Bryson Corona, CRNA Pre-anesthesia Checklist: Patient identified, Emergency Drugs available, Suction available and Patient being monitored Patient Re-evaluated:Patient Re-evaluated prior to induction Oxygen Delivery Method: Circle System Utilized Preoxygenation: Pre-oxygenation with 100% oxygen Induction Type: IV induction Ventilation: Mask ventilation without difficulty Laryngoscope Size: Mac and 4 Grade View: Grade I Tube type: Oral Tube size: 8.0 mm Number of attempts: 1 Airway Equipment and Method: Stylet and Oral airway Placement Confirmation: ETT inserted through vocal cords under direct vision,  positive ETCO2 and breath sounds checked- equal and bilateral Secured at: 22 cm Tube secured with: Tape Dental Injury: Teeth and Oropharynx as per pre-operative assessment

## 2018-11-26 NOTE — Progress Notes (Signed)
TCTS:  He remains stable without chest pain or shortness of breath. I had a long discussion with him and his brother last night about the surgery, recovery and risk factor reduction. I discussed the operative procedure in detail with the patient and his brother including alternatives, benefits and risks; including but not limited to bleeding, blood transfusion, infection, stroke, myocardial infarction, graft failure, heart block requiring a permanent pacemaker, organ dysfunction, and death.  [name] understands and agrees to proceed.

## 2018-11-26 NOTE — Procedures (Addendum)
Extubation Procedure Note  Patient Details:   Name: Jake Gross DOB: 11-23-1940 MRN: [id number]   Airway Documentation:    Vent end date: 11/26/18 Vent end time: 1800   Evaluation  O2 sats: stable throughout Complications: No apparent complications Patient did tolerate procedure well. Bilateral Breath Sounds: Clear, Diminished   Yes  NIF-20 FVC-665ml 4l/min [place] Incentive spirometer performed 662ml  [place] Standard 11/26/2018, 6:07 PM

## 2018-11-26 NOTE — Anesthesia Procedure Notes (Signed)
Arterial Line Insertion Start/End2/08/2019 6:40 AM, 11/26/2018 6:45 AM Performed by: Roderic Palau, MD, Bryson Corona, CRNA, CRNA  Patient location: Pre-op. Preanesthetic checklist: patient identified, IV checked, site marked, risks and benefits discussed, surgical consent, monitors and equipment checked, pre-op evaluation, timeout performed and anesthesia consent Lidocaine 1% used for infiltration radial was placed Catheter size: 20 Fr Hand hygiene performed  and maximum sterile barriers used   Attempts: 1 Procedure performed without using ultrasound guided technique. Following insertion, dressing applied. Post procedure assessment: normal and unchanged  Patient tolerated the procedure well with no immediate complications.

## 2018-11-27 ENCOUNTER — Inpatient Hospital Stay (HOSPITAL_COMMUNITY): Payer: Medicare Other

## 2018-11-27 ENCOUNTER — Encounter (HOSPITAL_COMMUNITY): Payer: Self-pay | Admitting: Surgery

## 2018-11-27 LAB — GLUCOSE, CAPILLARY
GLUCOSE-CAPILLARY: 127 mg/dL — AB (ref 70–99)
GLUCOSE-CAPILLARY: 191 mg/dL — AB (ref 70–99)
Glucose-Capillary: 105 mg/dL — ABNORMAL HIGH (ref 70–99)
Glucose-Capillary: 113 mg/dL — ABNORMAL HIGH (ref 70–99)
Glucose-Capillary: 118 mg/dL — ABNORMAL HIGH (ref 70–99)
Glucose-Capillary: 119 mg/dL — ABNORMAL HIGH (ref 70–99)
Glucose-Capillary: 120 mg/dL — ABNORMAL HIGH (ref 70–99)
Glucose-Capillary: 123 mg/dL — ABNORMAL HIGH (ref 70–99)
Glucose-Capillary: 126 mg/dL — ABNORMAL HIGH (ref 70–99)
Glucose-Capillary: 130 mg/dL — ABNORMAL HIGH (ref 70–99)
Glucose-Capillary: 134 mg/dL — ABNORMAL HIGH (ref 70–99)
Glucose-Capillary: 135 mg/dL — ABNORMAL HIGH (ref 70–99)
Glucose-Capillary: 138 mg/dL — ABNORMAL HIGH (ref 70–99)
Glucose-Capillary: 156 mg/dL — ABNORMAL HIGH (ref 70–99)
Glucose-Capillary: 96 mg/dL (ref 70–99)

## 2018-11-27 LAB — BASIC METABOLIC PANEL
ANION GAP: 9 (ref 5–15)
Anion gap: 6 (ref 5–15)
BUN: 25 mg/dL — ABNORMAL HIGH (ref 8–23)
BUN: 28 mg/dL — ABNORMAL HIGH (ref 8–23)
CO2: 22 mmol/L (ref 22–32)
CO2: 22 mmol/L (ref 22–32)
Calcium: 8.2 mg/dL — ABNORMAL LOW (ref 8.9–10.3)
Calcium: 8.4 mg/dL — ABNORMAL LOW (ref 8.9–10.3)
Chloride: 114 mmol/L — ABNORMAL HIGH (ref 98–111)
Chloride: 111 mmol/L (ref 98–111)
Creatinine, Ser: 2.01 mg/dL — ABNORMAL HIGH (ref 0.61–1.24)
Creatinine, Ser: 2.06 mg/dL — ABNORMAL HIGH (ref 0.61–1.24)
GFR calc Af Amer: 37 mL/min — ABNORMAL LOW (ref >60)
GFR calc Af Amer: 35 mL/min — ABNORMAL LOW (ref >60)
GFR calc non Af Amer: 32 mL/min — ABNORMAL LOW (ref >60)
GFR calc non Af Amer: 31 mL/min — ABNORMAL LOW (ref >60)
Glucose, Bld: 132 mg/dL — ABNORMAL HIGH (ref 70–99)
Glucose, Bld: 153 mg/dL — ABNORMAL HIGH (ref 70–99)
Potassium: 4.8 mmol/L (ref 3.5–5.1)
Potassium: 4.8 mmol/L (ref 3.5–5.1)
Sodium: 142 mmol/L (ref 135–145)
Sodium: 142 mmol/L (ref 135–145)

## 2018-11-27 LAB — CBC
HCT: 37.4 % — ABNORMAL LOW (ref 39.0–52.0)
HCT: 36.5 % — ABNORMAL LOW (ref 39.0–52.0)
Hemoglobin: 11.5 g/dL — ABNORMAL LOW (ref 13.0–17.0)
Hemoglobin: 11.3 g/dL — ABNORMAL LOW (ref 13.0–17.0)
MCH: 26.7 pg (ref 26.0–34.0)
MCH: 27 pg (ref 26.0–34.0)
MCHC: 30.7 g/dL (ref 30.0–36.0)
MCHC: 31 g/dL (ref 30.0–36.0)
MCV: 86.8 fL (ref 80.0–100.0)
MCV: 87.3 fL (ref 80.0–100.0)
Platelets: 183 10*3/uL (ref 150–400)
Platelets: 176 10*3/uL (ref 150–400)
RBC: 4.31 MIL/uL (ref 4.22–5.81)
RBC: 4.18 MIL/uL — ABNORMAL LOW (ref 4.22–5.81)
RDW: 14.6 % (ref 11.5–15.5)
RDW: 14.7 % (ref 11.5–15.5)
WBC: 16 10*3/uL — ABNORMAL HIGH (ref 4.0–10.5)
WBC: 16.9 10*3/uL — ABNORMAL HIGH (ref 4.0–10.5)
nRBC: 0 % (ref 0.0–0.2)
nRBC: 0 % (ref 0.0–0.2)

## 2018-11-27 LAB — MAGNESIUM
Magnesium: 3 mg/dL — ABNORMAL HIGH (ref 1.7–2.4)
Magnesium: 2.8 mg/dL — ABNORMAL HIGH (ref 1.7–2.4)

## 2018-11-27 MED ORDER — METOCLOPRAMIDE HCL 5 MG/ML IJ SOLN
10.0000 mg | Freq: Two times a day (BID) | INTRAMUSCULAR | Status: AC
  Administered 2018-11-27 (×2): 10 mg via INTRAVENOUS
  Filled 2018-11-27 (×2): qty 2

## 2018-11-27 MED ORDER — ENOXAPARIN SODIUM 30 MG/0.3ML ~~LOC~~ SOLN
30.0000 mg | Freq: Every day | SUBCUTANEOUS | Status: DC
  Administered 2018-11-27 – 2018-11-28 (×2): 30 mg via SUBCUTANEOUS
  Filled 2018-11-27 (×2): qty 0.3

## 2018-11-27 MED ORDER — INSULIN ASPART 100 UNIT/ML ~~LOC~~ SOLN
0.0000 [IU] | SUBCUTANEOUS | Status: DC
  Administered 2018-11-27: 4 [IU] via SUBCUTANEOUS
  Administered 2018-11-27 – 2018-11-28 (×4): 2 [IU] via SUBCUTANEOUS
  Administered 2018-11-28: 4 [IU] via SUBCUTANEOUS
  Administered 2018-11-28: 2 [IU] via SUBCUTANEOUS

## 2018-11-27 MED ORDER — INSULIN ASPART 100 UNIT/ML ~~LOC~~ SOLN
4.0000 [IU] | Freq: Three times a day (TID) | SUBCUTANEOUS | Status: DC
  Administered 2018-11-28 – 2018-12-02 (×8): 4 [IU] via SUBCUTANEOUS

## 2018-11-27 MED ORDER — INSULIN DETEMIR 100 UNIT/ML ~~LOC~~ SOLN: 20.0000 [IU] | Freq: Two times a day (BID) | SUBCUTANEOUS | Status: DC

## 2018-11-27 MED ORDER — INSULIN DETEMIR 100 UNIT/ML ~~LOC~~ SOLN
20.0000 [IU] | Freq: Two times a day (BID) | SUBCUTANEOUS | Status: DC
  Administered 2018-11-27 – 2018-12-02 (×9): 20 [IU] via SUBCUTANEOUS
  Filled 2018-11-27 (×11): qty 0.2

## 2018-11-27 MED ORDER — TRAMADOL HCL 50 MG PO TABS: 50.0000 mg | ORAL_TABLET | Freq: Four times a day (QID) | ORAL | Status: DC | PRN

## 2018-11-27 MED FILL — Heparin Sodium (Porcine) Inj 1000 Unit/ML: INTRAMUSCULAR | Qty: 30 | Status: AC

## 2018-11-27 MED FILL — Sodium Chloride IV Soln 0.9%: INTRAVENOUS | Qty: 2000 | Status: AC

## 2018-11-27 MED FILL — Heparin Sodium (Porcine) Inj 1000 Unit/ML: INTRAMUSCULAR | Qty: 10 | Status: AC

## 2018-11-27 MED FILL — Thrombin (Recombinant) For Soln 20000 Unit: CUTANEOUS | Qty: 1 | Status: AC

## 2018-11-27 MED FILL — Magnesium Sulfate Inj 50%: INTRAMUSCULAR | Qty: 10 | Status: AC

## 2018-11-27 MED FILL — Calcium Chloride Inj 10%: INTRAVENOUS | Qty: 10 | Status: AC

## 2018-11-27 MED FILL — Mannitol IV Soln 20%: INTRAVENOUS | Qty: 500 | Status: AC

## 2018-11-27 MED FILL — Electrolyte-R (PH 7.4) Solution: INTRAVENOUS | Qty: 3000 | Status: AC

## 2018-11-27 MED FILL — Lidocaine HCl(Cardiac) IV PF Soln Pref Syr 100 MG/5ML (2%): INTRAVENOUS | Qty: 5 | Status: AC

## 2018-11-27 MED FILL — Sodium Bicarbonate IV Soln 8.4%: INTRAVENOUS | Qty: 50 | Status: AC

## 2018-11-27 MED FILL — Potassium Chloride Inj 2 mEq/ML: INTRAVENOUS | Qty: 40 | Status: AC

## 2018-11-27 NOTE — Progress Notes (Signed)
      [address]       [place] [id number]             [phone]      POD # 1 CABG x 4  Just back from ambulating Still with some nausea BP (!) 157/71 (BP Location: Left Arm)   Pulse (!) 53   Temp 97.8 F (36.6 C) (Oral)   Resp 15   Ht 5\' 7"  (1.702 m)   Wt 102.2 kg   SpO2 96%   BMI 35.29 kg/m   Intake/Output Summary (Last 24 hours) at 11/27/2018 1811 Last data filed at 11/27/2018 1800 Gross per 24 hour  Intake 1095.15 ml  Output 1178 ml  Net -82.85 ml   ECG showed J point elevation this AM- not significantly changed  Creatinine unchanged at 2.06, K= 4.8  Remo Lipps C. Roxan Hockey, MD [facility] [phone]

## 2018-11-27 NOTE — Discharge Summary (Signed)
Physician Discharge Summary  Patient ID: Jake Gross MRN: [id number] DOB/AGE: 76-Jun-1942 76 y.o.  Admit date: 11/20/2018 Discharge date: 12/02/2018  Admission Diagnoses:  Patient Active Problem List   Diagnosis Date Noted  . Unstable angina ([place])   . Hyperkalemia 03/25/2018  . AKI (acute kidney injury) ([place]) 03/25/2018  . 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 HLD (hyperlipidemia) 09/14/2015  . Essential hypertension   . 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 Arthritis 09/16/2012  . Diabetes mellitus type 2, insulin dependent ([place]) 08/18/2011   Discharge Diagnoses:   Patient Active Problem List   Diagnosis Date Noted  . 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 Unstable angina ([place])   . Hyperkalemia 03/25/2018  . AKI (acute kidney injury) ([place]) 03/25/2018  . 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 HLD (hyperlipidemia) 09/14/2015  . Essential hypertension   . 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 Arthritis 09/16/2012  . Diabetes mellitus type 2, insulin dependent ([place]) 08/18/2011   Discharged Condition: good  History of Present Illness:  Jake Gross is a 78 yo white male with known history of Type 2 DM, HTN, dyslipidemia, CKD, and adenocarcinoma within a rectal polyp s/p partial colectomy.  He also has a history of CVA in 2018 without any residual effects.  The patient presented for evaluation of dyspnea and chest pressure that developed while he was taking trash out.  The patient symptoms resolved with rest.  He was evaluated by Chanetta Marshall NP who felt the patient was likely experiencing angina equivalent.  It was felt cardiac catheterization would be indicated.  The risks and benefits were explained to the patient and he was agreeable to proceed.  He was taken to the catheterization lab by Dr. Angelena Form on 11/20/2018 and was found to have multivessel CAD.  It was felt coronary bypass grafting would be indicated, he was  admitted and TCTS consult was obtained.  Hospital Course:   He remained chest pain free during admission.  He was evaluated by Dr. Cyndia Bent who felt the patient would benefit from coronary bypass grafting.  The risks and benefits of the procedure were explained to the patient and he was agreeable to proceed.  He was taken to the operating room and underwent CABG x 4 utilizing LIMA to LAD, SVG to D1, SVG D2, and SVG to PDA.  He also underwent endoscopic harvest of greater saphenous vein from his left leg.  He tolerated the procedure without difficulty and was taken to the SICU in stable condition.  He was extubated the evening of surgery.  During his stay in the SICU the patient did not require inotropic support.  His chest tubes and arterial lines were removed without difficulty.  He has baseline CKD, with creatinine peaking at 3 post catheterization.  Post operatively his creatinine remained stable.  He was hypertensive and his Lopressor dose was titrated accordingly.  He is not a candidate for ACE/ARB due to baseline CKD.  He was treated with Lasix for hypervolemia.  He was restarted on Plavix due to history of stroke and diffuse coronary disease.  He was maintaining NSR and was stable for transfer to the progressive care unit on 11/28/2018.  He continues to make progress.  He remains in NSR with 1st degree AV Block.  He has been slow to wean off oxygen and was treated aggressively with lasix and pulmonary toilet.  He remained hypertensive and his norvasc was titrated to his  home dose.  He was also restarted on his home regimen of hydralazine.  His pacing wires have been removed without difficulty.  He is ambulating independently.  His blood sugars have been well controlled and he was educated on the importance of continuing good glucose management.  His incisions are healing without evidence of infection.  He is medically stable for discharge home today.     Significant Diagnostic Studies: angiography:     Prox RCA to Mid RCA lesion is 30% stenosed.  RPDA lesion is 99% stenosed.  Post Atrio lesion is 100% stenosed.  Ost 2nd Diag to 2nd Diag lesion is 99% stenosed.  Prox LAD lesion is 95% stenosed.  Ost 1st Diag lesion is 80% stenosed.  Ost Cx to Prox Cx lesion is 70% stenosed.  Ost LM lesion is 70% stenosed.   1. Moderately severe ostial left main stenosis.  2. Severe stenosis in the heavily calcified mid LAD 3. Moderately severe proximal Circumflex stenosis 4. The RCA has mild calcific disease through the proximal and mid segments. The PDA is a moderate caliber vessel with severe stenosis. 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  5. Elevated LVEDP  Treatments: surgery:   1. Median Sternotomy 2. Extracorporeal circulation 3.   Coronary artery bypass grafting x 4   Left internal mammary graft to the LAD  SVG to D1  SVG to D2  SVG to PDA 4.   Endoscopic vein harvest from the left leg  Discharge Exam: Blood pressure 116/74, pulse 86, temperature 98 F (36.7 C), temperature source Oral, resp. rate 18, height 5\' 7"  (1.702 m), weight 95.2 kg, SpO2 97 %.   General appearance: alert, cooperative and no distress Heart: regular rate and rhythm Lungs: clear to auscultation bilaterally Abdomen: soft, non-tender; bowel sounds normal; no masses,  no organomegaly Extremities: edema trace Wound: clean and dry   Discharge disposition: 01-Home or Self Care   Discharge Medications:  The patient has been discharged on:   1.Beta Blocker:  Yes [ X  ]                              No   [   ]                              If No, reason:  2.Ace Inhibitor/ARB: Yes [   ]                                     No  [  X  ]                                     If No, reason: elevated creatinine  3.Statin:   Yes [ X  ]                  No  [   ]                  If No, reason:  4.Ecasa:  Yes  [  X ]                  No   [    ]  If No, reason:    Discharge Instructions    Amb Referral to Cardiac Rehabilitation   Complete by:  As directed    Diagnosis:  CABG   CABG X ___:  4     Allergies as of 12/02/2018   No Known Allergies     Medication List    STOP taking these medications   diltiazem 240 MG 24 hr capsule Commonly known as:  CARDIZEM CD   furosemide 20 MG tablet Commonly known as:  LASIX   isosorbide mononitrate 60 MG 24 hr tablet Commonly known as:  IMDUR   lisinopril-hydrochlorothiazide 20-25 MG tablet Commonly known as:  PRINZIDE,ZESTORETIC   metoprolol succinate 25 MG 24 hr tablet Commonly known as:  TOPROL-XL     TAKE these medications   amLODipine 10 MG tablet Commonly known as:  NORVASC Take 1 tablet (10 mg total) by mouth daily.   aspirin EC 81 MG tablet Take 1 tablet (81 mg total) by mouth daily.   clopidogrel 75 MG tablet Commonly known as:  PLAVIX Take 1 tablet (75 mg total) by mouth daily. MUST SCHEDULE ANNUAL EXAM   fenofibrate micronized 67 MG capsule Commonly known as:  LOFIBRA TAKE 1 CAPSULE DAILY BEFORE BREAKFAST What changed:  See the new instructions.   hydrALAZINE 25 MG tablet Commonly known as:  APRESOLINE Take 1 tablet (25 mg total) by mouth 3 (three) times daily.   Insulin Glargine 100 UNIT/ML Solostar Pen Commonly known as:  LANTUS SOLOSTAR Inject 100 Units into the skin every morning.   metoprolol tartrate 25 MG tablet Commonly known as:  LOPRESSOR Take 1 tablet (25 mg total) by mouth 2 (two) times daily.   ONE TOUCH ULTRA TEST test strip Generic drug:  glucose blood USE 1 STRIP TWICE A DAY   ONETOUCH DELICA LANCETS 66A Misc   rosuvastatin 40 MG tablet Commonly known as:  CRESTOR Take 1 tablet (40 mg total) by mouth at bedtime. What changed:    medication strength  how much to take  additional instructions   traMADol 50 MG tablet Commonly known as:  ULTRAM Take 1 tablet (50 mg total) by mouth every 6 (six) hours  as needed for moderate pain or severe pain.      Follow-up Information    Gaye Pollack, MD Follow up on 01/01/2019.   Specialty:  Cardiothoracic Surgery Why:  Appointment is at 1:30, please get CXR at 1:00 at [facility] located on first floor of our office building Contact information: [place] [id number] [phone]        [facility] Follow up on 12/09/2018.   Specialty:  Cardiothoracic Surgery Why:  Appointment is at 10:00 Contact information: [address] [place], [place] [phone]          Signed: Ellwood Handler 12/02/2018, 8:04 AM

## 2018-11-27 NOTE — Progress Notes (Signed)
1 Day Post-Op Procedure(s) (LRB): CORONARY ARTERY BYPASS GRAFTING (CABG), ON PUMP, TIMES FOUR, USING LEFT INTERNAL MAMMARY ARTERY AND ENDOSCOPICALLY HARVESTED LEFT SAPHENOUS VEIN (N/A) TRANSESOPHAGEAL ECHOCARDIOGRAM (TEE) (N/A) Subjective: Sore but otherwise feels ok   Objective: Vital signs in last 24 hours: Temp:  [96.8 F (36 C)-98.1 F (36.7 C)] 97.3 F (36.3 C) (02/12 0725) Cardiac Rhythm: Atrial paced (02/12 0400) Resp:  [10-25] 15 (02/12 0725) BP: (77-214)/(58-96) 114/62 (02/12 0725) SpO2:  [90 %-100 %] 90 % (02/12 0725) FiO2 (%):  [40 %-50 %] 40 % (02/11 1730) Weight:  [102.2 kg] 102.2 kg (02/12 0500)  Hemodynamic parameters for last 24 hours: PAP: (20-38)/(10-20) 29/10 CO:  [3.9 L/min-8.6 L/min] 6.6 L/min CI:  [1.9 L/min/m2-4.1 L/min/m2] 3.2 L/min/m2  Intake/Output from previous day: 02/11 0701 - 02/12 0700 In: 2130 [I.V.:2621.8; Blood:250; IV Piggyback:503.2] Out: 2250 [Urine:1500; Blood:500; Chest Tube:250] Intake/Output this shift: No intake/output data recorded.  General appearance: alert and cooperative Neurologic: intact Heart: regular rate and rhythm, S1, S2 normal, no murmur, click, rub or gallop Lungs: clear to auscultation bilaterally Extremities: edema mild Wound: dressings dry  Lab Results: Recent Labs    11/26/18 1852 11/26/18 1859 11/27/18 0405  WBC 14.4*  --  16.0*  HGB 12.2* 11.6* 11.5*  HCT 39.4 34.0* 37.4*  PLT 191  --  183   BMET:  Recent Labs    11/26/18 0426  11/26/18 1204  11/26/18 1858 11/26/18 1859 11/27/18 0405  NA 143   < > 142   < >  --  142 142  K 4.0   < > 4.6   < >  --  5.0 4.8  CL 112*  --   --   --   --   --  114*  CO2 21*  --   --   --   --   --  22  GLUCOSE 111*   < > 128*  --   --   --  132*  BUN 25*  --   --   --   --   --  25*  CREATININE 1.92*  --   --   --  1.79*  --  2.01*  CALCIUM 8.7*  --   --   --   --   --  8.2*   < > = values in this interval not displayed.    PT/INR:  Recent Labs     11/26/18 1334  LABPROT 15.7*  INR 1.26   ABG    Component Value Date/Time   PHART 7.346 (L) 11/26/2018 1859   HCO3 21.0 11/26/2018 1859   TCO2 22 11/26/2018 1859   ACIDBASEDEF 4.0 (H) 11/26/2018 1859   O2SAT 91.0 11/26/2018 1859   CBG (last 3)  Recent Labs    11/27/18 0515 11/27/18 0558 11/27/18 0653  GLUCAP 119* 118* 105*   CXR: clear  ECG: sinus, mild diffuse ST elevation suggesting pericarditis.   Assessment/Plan: S/P Procedure(s) (LRB): CORONARY ARTERY BYPASS GRAFTING (CABG), ON PUMP, TIMES FOUR, USING LEFT INTERNAL MAMMARY ARTERY AND ENDOSCOPICALLY HARVESTED LEFT SAPHENOUS VEIN (N/A) TRANSESOPHAGEAL ECHOCARDIOGRAM (TEE) (N/A)  POD 1 Hemodynamically stable in sinus rhythm. Continue low dose Lopressor.  Stage 3 CKD with baseline creat 1.5-1.8. It went up to 3 after cath. Now 2.0 postop which is same as immediately preop. Continue to follow.   DM: poorly controlled preop with Hgb A1c of 7.1 on admission on Lantus 100 units every morning at home. Suspect his diet is poor and will need modification.  Start Levemir and SSI with meal coverage and stop insulin drip.  Volume excess: 9 lbs over preop wt. Hold off on diuresis today to allow kidneys to recover from surgery.  DC chest tubes, swan, arterial line.  OOB, IS, ambulate later today.   LOS: 7 days    Gaye Pollack 11/27/2018

## 2018-11-27 NOTE — Discharge Instructions (Signed)

## 2018-11-27 NOTE — Progress Notes (Signed)
EKG CRITICAL VALUE     12 lead EKG performed.  Critical value noted.  Sharyn Dross, RN notified.   Radene Gunning, CCT 11/27/2018 9:31 AM

## 2018-11-28 ENCOUNTER — Inpatient Hospital Stay (HOSPITAL_COMMUNITY): Payer: Medicare Other

## 2018-11-28 LAB — CBC
HCT: 34.2 % — ABNORMAL LOW (ref 39.0–52.0)
Hemoglobin: 10.7 g/dL — ABNORMAL LOW (ref 13.0–17.0)
MCH: 27.6 pg (ref 26.0–34.0)
MCHC: 31.3 g/dL (ref 30.0–36.0)
MCV: 88.4 fL (ref 80.0–100.0)
PLATELETS: 169 10*3/uL (ref 150–400)
RBC: 3.87 MIL/uL — ABNORMAL LOW (ref 4.22–5.81)
RDW: 14.8 % (ref 11.5–15.5)
WBC: 15.2 10*3/uL — ABNORMAL HIGH (ref 4.0–10.5)
nRBC: 0 % (ref 0.0–0.2)

## 2018-11-28 LAB — GLUCOSE, CAPILLARY
GLUCOSE-CAPILLARY: 99 mg/dL (ref 70–99)
Glucose-Capillary: 110 mg/dL — ABNORMAL HIGH (ref 70–99)
Glucose-Capillary: 174 mg/dL — ABNORMAL HIGH (ref 70–99)
Glucose-Capillary: 158 mg/dL — ABNORMAL HIGH (ref 70–99)
Glucose-Capillary: 112 mg/dL — ABNORMAL HIGH (ref 70–99)
Glucose-Capillary: 133 mg/dL — ABNORMAL HIGH (ref 70–99)

## 2018-11-28 LAB — BASIC METABOLIC PANEL
Anion gap: 9 (ref 5–15)
BUN: 28 mg/dL — ABNORMAL HIGH (ref 8–23)
CALCIUM: 8.4 mg/dL — AB (ref 8.9–10.3)
CO2: 23 mmol/L (ref 22–32)
Chloride: 110 mmol/L (ref 98–111)
Creatinine, Ser: 1.89 mg/dL — ABNORMAL HIGH (ref 0.61–1.24)
GFR calc non Af Amer: 34 mL/min — ABNORMAL LOW (ref >60)
GFR, EST AFRICAN AMERICAN: 39 mL/min — AB (ref >60)
Glucose, Bld: 127 mg/dL — ABNORMAL HIGH (ref 70–99)
Potassium: 4.7 mmol/L (ref 3.5–5.1)
Sodium: 142 mmol/L (ref 135–145)

## 2018-11-28 MED ORDER — SODIUM CHLORIDE 0.9% FLUSH: 3.0000 mL | INTRAVENOUS | Status: DC | PRN

## 2018-11-28 MED ORDER — SODIUM CHLORIDE 0.9% FLUSH
3.0000 mL | Freq: Two times a day (BID) | INTRAVENOUS | Status: DC
  Administered 2018-11-28 – 2018-12-02 (×8): 3 mL via INTRAVENOUS

## 2018-11-28 MED ORDER — FUROSEMIDE 10 MG/ML IJ SOLN
40.0000 mg | Freq: Once | INTRAMUSCULAR | Status: AC
  Administered 2018-11-28: 40 mg via INTRAVENOUS
  Filled 2018-11-28: qty 4

## 2018-11-28 MED ORDER — MOVING RIGHT ALONG BOOK
Freq: Once | Status: AC
  Administered 2018-11-28: 17:00:00
  Filled 2018-11-28: qty 1

## 2018-11-28 MED ORDER — METOPROLOL TARTRATE 25 MG/10 ML ORAL SUSPENSION
25.0000 mg | Freq: Two times a day (BID) | ORAL | Status: DC
  Filled 2018-11-28 (×8): qty 10

## 2018-11-28 MED ORDER — SODIUM CHLORIDE 0.9 % IV SOLN: 250.0000 mL | INTRAVENOUS | Status: DC | PRN

## 2018-11-28 MED ORDER — METOPROLOL TARTRATE 25 MG PO TABS
25.0000 mg | ORAL_TABLET | Freq: Two times a day (BID) | ORAL | Status: DC
  Administered 2018-11-28 – 2018-12-02 (×9): 25 mg via ORAL
  Filled 2018-11-28 (×9): qty 1

## 2018-11-28 NOTE — Progress Notes (Signed)
11/28/2018 1520 Received pt to room 4E-01 from [facility].  Pt is A&O, no C/O voiced.  Tele monitor applied and CCMD notified.  CHG bath given.  Oriented pt to room, call light and bed.  Call bell in reach. [place]

## 2018-11-28 NOTE — Progress Notes (Signed)
2 Days Post-Op Procedure(s) (LRB): CORONARY ARTERY BYPASS GRAFTING (CABG), ON PUMP, TIMES FOUR, USING LEFT INTERNAL MAMMARY ARTERY AND ENDOSCOPICALLY HARVESTED LEFT SAPHENOUS VEIN (N/A) TRANSESOPHAGEAL ECHOCARDIOGRAM (TEE) (N/A) Subjective: No specific complaints. Did not sleep well. Says he was woken up early.   Objective: Vital signs in last 24 hours: Temp:  [97.3 F (36.3 C)-98.9 F (37.2 C)] 98.3 F (36.8 C) (02/13 0735) Pulse Rate:  [86] 86 (02/12 2211) Cardiac Rhythm: Normal sinus rhythm (02/13 0400) Resp:  [10-20] 16 (02/13 0700) BP: (111-169)/(52-133) 138/68 (02/13 0700) SpO2:  [88 %-98 %] 96 % (02/13 0700) Weight:  [101.4 kg] 101.4 kg (02/13 0500)  Hemodynamic parameters for last 24 hours: PAP: (33)/(13) 33/13  Intake/Output from previous day: 02/12 0701 - 02/13 0700 In: 627 [P.O.:480; I.V.:47; IV Piggyback:100] Out: 1125 [Urine:1115; Chest Tube:10] Intake/Output this shift: No intake/output data recorded.  General appearance: alert and cooperative Neurologic: intact Heart: regular rate and rhythm, S1, S2 normal, no murmur, click, rub or gallop Lungs: clear to auscultation bilaterally Extremities: edema mild Wound: dressings dry  Lab Results: Recent Labs    11/27/18 1632 11/28/18 0434  WBC 16.9* 15.2*  HGB 11.3* 10.7*  HCT 36.5* 34.2*  PLT 176 169   BMET:  Recent Labs    11/27/18 1632 11/28/18 0434  NA 142 142  K 4.8 4.7  CL 111 110  CO2 22 23  GLUCOSE 153* 127*  BUN 28* 28*  CREATININE 2.06* 1.89*  CALCIUM 8.4* 8.4*    PT/INR:  Recent Labs    11/26/18 1334  LABPROT 15.7*  INR 1.26   ABG    Component Value Date/Time   PHART 7.346 (L) 11/26/2018 1859   HCO3 21.0 11/26/2018 1859   TCO2 22 11/26/2018 1859   ACIDBASEDEF 4.0 (H) 11/26/2018 1859   O2SAT 91.0 11/26/2018 1859   CBG (last 3)  Recent Labs    11/27/18 2314 11/28/18 0320 11/28/18 0731  GLUCAP 138* 110* 174*   CXR: ok  Assessment/Plan: S/P Procedure(s)  (LRB): CORONARY ARTERY BYPASS GRAFTING (CABG), ON PUMP, TIMES FOUR, USING LEFT INTERNAL MAMMARY ARTERY AND ENDOSCOPICALLY HARVESTED LEFT SAPHENOUS VEIN (N/A) TRANSESOPHAGEAL ECHOCARDIOGRAM (TEE) (N/A)  POD 2 Hemodynamically stable with tendency towards HTN. Will increase Lopressor as tolerated. He was on an ACE I preop but would not resume that with his elevated creat.   Stage 3 CKD: creat stable and decreasing.   Volume excess: wt is 7 lbs over preop and has some edema. Will give a dose of lasix this am and see how he responds.  DM: glucose under adequate control on current regimen.  DC sleeve and foley.   Continue IS and ambulation.  Transfer to 4E.  Plan to resume Plavix with ASA 81 mg after pacing wires out for hx of stroke and diffuse coronary disease.   LOS: 8 days    Gaye Pollack 11/28/2018

## 2018-11-29 ENCOUNTER — Inpatient Hospital Stay (HOSPITAL_COMMUNITY): Payer: Medicare Other

## 2018-11-29 LAB — BASIC METABOLIC PANEL
Anion gap: 4 — ABNORMAL LOW (ref 5–15)
BUN: 30 mg/dL — AB (ref 8–23)
CO2: 27 mmol/L (ref 22–32)
Calcium: 8.3 mg/dL — ABNORMAL LOW (ref 8.9–10.3)
Chloride: 111 mmol/L (ref 98–111)
Creatinine, Ser: 1.88 mg/dL — ABNORMAL HIGH (ref 0.61–1.24)
GFR calc Af Amer: 40 mL/min — ABNORMAL LOW (ref >60)
GFR calc non Af Amer: 34 mL/min — ABNORMAL LOW (ref >60)
Glucose, Bld: 89 mg/dL (ref 70–99)
Potassium: 4.2 mmol/L (ref 3.5–5.1)
Sodium: 142 mmol/L (ref 135–145)

## 2018-11-29 LAB — CBC
HCT: 34 % — ABNORMAL LOW (ref 39.0–52.0)
Hemoglobin: 10.5 g/dL — ABNORMAL LOW (ref 13.0–17.0)
MCH: 27 pg (ref 26.0–34.0)
MCHC: 30.9 g/dL (ref 30.0–36.0)
MCV: 87.4 fL (ref 80.0–100.0)
Platelets: 177 10*3/uL (ref 150–400)
RBC: 3.89 MIL/uL — ABNORMAL LOW (ref 4.22–5.81)
RDW: 14.6 % (ref 11.5–15.5)
WBC: 11.2 10*3/uL — ABNORMAL HIGH (ref 4.0–10.5)
nRBC: 0.2 % (ref 0.0–0.2)

## 2018-11-29 LAB — GLUCOSE, CAPILLARY
GLUCOSE-CAPILLARY: 126 mg/dL — AB (ref 70–99)
Glucose-Capillary: 88 mg/dL (ref 70–99)
Glucose-Capillary: 168 mg/dL — ABNORMAL HIGH (ref 70–99)
Glucose-Capillary: 174 mg/dL — ABNORMAL HIGH (ref 70–99)
Glucose-Capillary: 139 mg/dL — ABNORMAL HIGH (ref 70–99)

## 2018-11-29 MED ORDER — POTASSIUM CHLORIDE CRYS ER 20 MEQ PO TBCR
20.0000 meq | EXTENDED_RELEASE_TABLET | Freq: Every day | ORAL | Status: AC
  Administered 2018-11-29 – 2018-11-30 (×2): 20 meq via ORAL
  Filled 2018-11-29 (×2): qty 1

## 2018-11-29 MED ORDER — LACTULOSE 10 GM/15ML PO SOLN
20.0000 g | Freq: Every day | ORAL | Status: AC
  Administered 2018-11-29: 20 g via ORAL
  Filled 2018-11-29: qty 30

## 2018-11-29 MED ORDER — INSULIN ASPART 100 UNIT/ML ~~LOC~~ SOLN
0.0000 [IU] | Freq: Three times a day (TID) | SUBCUTANEOUS | Status: DC
  Administered 2018-11-29: 4 [IU] via SUBCUTANEOUS
  Administered 2018-11-30 – 2018-12-01 (×2): 2 [IU] via SUBCUTANEOUS

## 2018-11-29 MED ORDER — LEVALBUTEROL HCL 0.63 MG/3ML IN NEBU
0.6300 mg | INHALATION_SOLUTION | Freq: Three times a day (TID) | RESPIRATORY_TRACT | Status: DC
  Administered 2018-11-29 – 2018-11-30 (×2): 0.63 mg via RESPIRATORY_TRACT
  Filled 2018-11-29 (×3): qty 3

## 2018-11-29 MED ORDER — FUROSEMIDE 40 MG PO TABS
40.0000 mg | ORAL_TABLET | Freq: Every day | ORAL | Status: AC
  Administered 2018-11-29 – 2018-11-30 (×2): 40 mg via ORAL
  Filled 2018-11-29 (×2): qty 1

## 2018-11-29 MED ORDER — LEVALBUTEROL HCL 0.63 MG/3ML IN NEBU: 0.6300 mg | INHALATION_SOLUTION | Freq: Three times a day (TID) | RESPIRATORY_TRACT | Status: DC

## 2018-11-29 MED ORDER — GUAIFENESIN ER 600 MG PO TB12: 1200.0000 mg | ORAL_TABLET | Freq: Two times a day (BID) | ORAL | Status: DC | PRN

## 2018-11-29 NOTE — Plan of Care (Signed)
  Problem: Activity: Goal: Ability to return to baseline activity level will improve Outcome: Progressing   Problem: Education: Goal: Knowledge of General Education information will improve Description Including pain rating scale, medication(s)/side effects and non-pharmacologic comfort measures Outcome: Progressing   Problem: Clinical Measurements: Goal: Will remain free from infection Outcome: Progressing Goal: Cardiovascular complication will be avoided Outcome: Progressing   Problem: Elimination: Goal: Will not experience complications related to bowel motility Outcome: Progressing Goal: Will not experience complications related to urinary retention Outcome: Progressing

## 2018-11-29 NOTE — Progress Notes (Addendum)
      [address]       [place] [address]             [phone]      3 Days Post-Op Procedure(s) (LRB): CORONARY ARTERY BYPASS GRAFTING (CABG), ON PUMP, TIMES FOUR, USING LEFT INTERNAL MAMMARY ARTERY AND ENDOSCOPICALLY HARVESTED LEFT SAPHENOUS VEIN (N/A) TRANSESOPHAGEAL ECHOCARDIOGRAM (TEE) (N/A)   Subjective:  Patient continues to have incisional pain.  Otherwise states he is doing okay.  Says he needs to move his bowels.  Objective: Vital signs in last 24 hours: Temp:  [97.7 F (36.5 C)-98.6 F (37 C)] 98.5 F (36.9 C) (02/14 0339) Pulse Rate:  [70-85] 70 (02/14 0342) Cardiac Rhythm: Heart block (02/14 0700) Resp:  [6-26] 21 (02/14 0342) BP: (132-185)/(65-105) 154/70 (02/14 0342) SpO2:  [94 %-98 %] 94 % (02/14 0342) Weight:  [99.8 kg] 99.8 kg (02/14 0342)  Intake/Output from previous day: 02/13 0701 - 02/14 0700 In: 290 [P.O.:250; I.V.:40] Out: 875 [Urine:875]  General appearance: alert, cooperative and no distress Heart: regular rate and rhythm Lungs: diminished breath sounds bibasilar and wheezes mild, scattered Abdomen: soft, non-tender; bowel sounds normal; no masses,  no organomegaly Extremities: edema trace Wound: clean and dry  Lab Results: Recent Labs    11/28/18 0434 11/29/18 0328  WBC 15.2* 11.2*  HGB 10.7* 10.5*  HCT 34.2* 34.0*  PLT 169 177   BMET:  Recent Labs    11/28/18 0434 11/29/18 0328  NA 142 142  K 4.7 4.2  CL 110 111  CO2 23 27  GLUCOSE 127* 89  BUN 28* 30*  CREATININE 1.89* 1.88*  CALCIUM 8.4* 8.3*    PT/INR:  Recent Labs    11/26/18 1334  LABPROT 15.7*  INR 1.26   ABG    Component Value Date/Time   PHART 7.346 (L) 11/26/2018 1859   HCO3 21.0 11/26/2018 1859   TCO2 22 11/26/2018 1859   ACIDBASEDEF 4.0 (H) 11/26/2018 1859   O2SAT 91.0 11/26/2018 1859   CBG (last 3)  Recent Labs    11/28/18 1952 11/28/18 2345 11/29/18 0345  GLUCAP 133* 99 88    Assessment/Plan: S/P Procedure(s)  (LRB): CORONARY ARTERY BYPASS GRAFTING (CABG), ON PUMP, TIMES FOUR, USING LEFT INTERNAL MAMMARY ARTERY AND ENDOSCOPICALLY HARVESTED LEFT SAPHENOUS VEIN (N/A) TRANSESOPHAGEAL ECHOCARDIOGRAM (TEE) (N/A)  1. CV- NSR with 1st degree AV Block- continue Lopressor 2. Pulm- wean oxygen as tolerated, atelectasis on CXR, difficulty clearing sputum will start Xopenex nebs, add Mucinex, encouraged more frequent use of IS 3. Renal- CKD, creatinine is at baseline, will start oral Lasix 40 mg daily 4. Expected post operative blood loss anemia, mild Hgb at 10.5 5. DM- poorly controlled preoperatively, sugars okay here, would benefit from dietary education 6. Dispo- patient stable, in NSR with 1st degree AV block, will d/c EPW in AM if no arrhythmia present, aggressive pulmonary toilet, diuresis, continue current care   LOS: 9 days    Ellwood Handler 11/29/2018   Chart reviewed, patient examined, agree with above. Maintaining sinus rhythm Renal function at baseline Wt is only 3.5 lbs over preop and he has no edema. Will give a dose of lasix today and tomorrow but would not keep on lasix after that so he does not get pre-renal.

## 2018-11-29 NOTE — Care Management Important Message (Signed)
Important Message  Patient Details  Name: Jake Gross MRN: [id number] Date of Birth: 08-17-1941   Medicare Important Message Given:  Yes    Barb Merino [place] 11/29/2018, 11:49 AM

## 2018-11-29 NOTE — Progress Notes (Signed)
CARDIAC REHAB PHASE I   PRE:  Rate/Rhythm: 68 SR   BP:  Supine:   Sitting: 157/77  Standing:    SaO2: 98 % 2L  MODE:  Ambulation: 470 ft   POST:  Rate/Rhythm: 68 SR  BP:  Supine:   Sitting: 143/71  Standing:    SaO2: 98 % RA  1330-1450  Pt assisted to bathroom prior to walk. Pt cleaned and dried. Pt assisted with O2 at 2L with a walker. Pt then assisted to ambulate 470 ft with assistance using walker and O2 on 2L. Pt educated on sternal precautions prior to walk and reiterated with education and restrictions of movement. Pt also educated on exercise and activity after discharge, heart healthy and diabetic diet, IS use, and cardiac rehab. CRPII referral placed to [facility] All questions answered.  Towanda Malkin RN, BSN 11/29/2018 2:45 PM

## 2018-11-30 LAB — GLUCOSE, CAPILLARY
GLUCOSE-CAPILLARY: 118 mg/dL — AB (ref 70–99)
Glucose-Capillary: 102 mg/dL — ABNORMAL HIGH (ref 70–99)
Glucose-Capillary: 135 mg/dL — ABNORMAL HIGH (ref 70–99)
Glucose-Capillary: 153 mg/dL — ABNORMAL HIGH (ref 70–99)

## 2018-11-30 LAB — BASIC METABOLIC PANEL
ANION GAP: 7 (ref 5–15)
BUN: 35 mg/dL — ABNORMAL HIGH (ref 8–23)
CO2: 23 mmol/L (ref 22–32)
Calcium: 8 mg/dL — ABNORMAL LOW (ref 8.9–10.3)
Chloride: 111 mmol/L (ref 98–111)
Creatinine, Ser: 1.69 mg/dL — ABNORMAL HIGH (ref 0.61–1.24)
GFR calc Af Amer: 45 mL/min — ABNORMAL LOW (ref >60)
GFR calc non Af Amer: 39 mL/min — ABNORMAL LOW (ref >60)
Glucose, Bld: 107 mg/dL — ABNORMAL HIGH (ref 70–99)
Potassium: 3.8 mmol/L (ref 3.5–5.1)
Sodium: 141 mmol/L (ref 135–145)

## 2018-11-30 MED ORDER — LEVALBUTEROL HCL 0.63 MG/3ML IN NEBU: 0.6300 mg | INHALATION_SOLUTION | Freq: Four times a day (QID) | RESPIRATORY_TRACT | Status: DC | PRN

## 2018-11-30 MED ORDER — DIPHENOXYLATE-ATROPINE 2.5-0.025 MG PO TABS: 1.0000 | ORAL_TABLET | Freq: Four times a day (QID) | ORAL | Status: DC | PRN

## 2018-11-30 MED ORDER — FE FUMARATE-B12-VIT C-FA-IFC PO CAPS
1.0000 | ORAL_CAPSULE | Freq: Two times a day (BID) | ORAL | Status: DC
  Administered 2018-11-30 – 2018-12-02 (×4): 1 via ORAL
  Filled 2018-11-30 (×4): qty 1

## 2018-11-30 MED ORDER — AMLODIPINE BESYLATE 5 MG PO TABS
5.0000 mg | ORAL_TABLET | Freq: Every day | ORAL | Status: DC
  Administered 2018-11-30: 5 mg via ORAL
  Filled 2018-11-30: qty 1

## 2018-11-30 NOTE — Progress Notes (Signed)
Patient ambulated in hallway with nursing staff and walker. Patient with fast pace when walking. Tolerated well. Ambulated 470 feet. Back in chair call bell with in reach will monitor patient. Steadman Prosperi, Bettina Gavia RN

## 2018-11-30 NOTE — Progress Notes (Signed)
CARDIAC REHAB PHASE I   PRE:  Rate/Rhythm: 83  BP:  Sitting: 138/85     SaO2: 94% ra  MODE:  Ambulation: 470 ft   POST:  Rate/Rhythm: 102  BP:  Sitting: 175/74     SaO2: 95% ra  10am-10:50am Patient ambulated with rolling walker independently on room air. Saturation stayed above 93% entire time. Patient stated that his pace was faster than his normal walking pace. Education completed with brother and patient. Patient interested in Cardiac Rehab at [facility]. Patient in chair with call bell in reach and brother at side.  [place], MS 11/30/2018 10:46 AM

## 2018-11-30 NOTE — Progress Notes (Signed)
Notified by CCMD that patient's HR dropped to 53 @ 0010. HR returned to 60's. Patient resting comfortably. Asymptomatic. Will continue to monitor

## 2018-11-30 NOTE — Plan of Care (Signed)
  Problem: Activity: Goal: Ability to return to baseline activity level will improve Outcome: Progressing   Problem: Health Behavior/Discharge Planning: Goal: Ability to manage health-related needs will improve Outcome: Progressing   Problem: Clinical Measurements: Goal: Diagnostic test results will improve Outcome: Progressing   Problem: Activity: Goal: Risk for activity intolerance will decrease Outcome: Progressing

## 2018-11-30 NOTE — Progress Notes (Addendum)
[address]       [place] [address]             [phone]      4 Days Post-Op Procedure(s) (LRB): CORONARY ARTERY BYPASS GRAFTING (CABG), ON PUMP, TIMES FOUR, USING LEFT INTERNAL MAMMARY ARTERY AND ENDOSCOPICALLY HARVESTED LEFT SAPHENOUS VEIN (N/A) TRANSESOPHAGEAL ECHOCARDIOGRAM (TEE) (N/A) Subjective: Feels well, no new issues/complaints, not SOB, ambulation improving  Objective: Vital signs in last 24 hours: Temp:  [97.9 F (36.6 C)-98.6 F (37 C)] 98.6 F (37 C) (02/15 0341) Pulse Rate:  [61-85] 67 (02/15 0341) Cardiac Rhythm: Normal sinus rhythm (02/15 0701) Resp:  [12-27] 13 (02/15 0341) BP: (143-188)/(70-90) 147/80 (02/15 0341) SpO2:  [93 %-98 %] 96 % (02/15 0711) Weight:  [99.2 kg] 99.2 kg (02/15 0341)  Hemodynamic parameters for last 24 hours:    Intake/Output from previous day: 02/14 0701 - 02/15 0700 In: 480 [P.O.:480] Out: 900 [Urine:900] Intake/Output this shift: No intake/output data recorded.  General appearance: alert, cooperative and no distress Heart: regular rate and rhythm Lungs: clear to auscultation bilaterally Abdomen: benign Extremities: + LE edema Wound: incis healing well  Lab Results: Recent Labs    11/28/18 0434 11/29/18 0328  WBC 15.2* 11.2*  HGB 10.7* 10.5*  HCT 34.2* 34.0*  PLT 169 177   BMET:  Recent Labs    11/29/18 0328 11/30/18 0256  NA 142 141  K 4.2 3.8  CL 111 111  CO2 27 23  GLUCOSE 89 107*  BUN 30* 35*  CREATININE 1.88* 1.69*  CALCIUM 8.3* 8.0*    PT/INR: No results for input(s): LABPROT, INR in the last 72 hours. ABG    Component Value Date/Time   PHART 7.346 (L) 11/26/2018 1859   HCO3 21.0 11/26/2018 1859   TCO2 22 11/26/2018 1859   ACIDBASEDEF 4.0 (H) 11/26/2018 1859   O2SAT 91.0 11/26/2018 1859   CBG (last 3)  Recent Labs    11/29/18 1639 11/29/18 2126 11/30/18 0604  GLUCAP 126* 139* 102*    Meds Scheduled Meds: . acetaminophen  1,000 mg Oral Q6H   Or  .  acetaminophen (TYLENOL) oral liquid 160 mg/5 mL  1,000 mg Per Tube Q6H  . aspirin EC  325 mg Oral Daily   Or  . aspirin  324 mg Per Tube Daily  . bisacodyl  10 mg Oral Daily   Or  . bisacodyl  10 mg Rectal Daily  . docusate sodium  200 mg Oral Daily  . furosemide  40 mg Oral Daily  . insulin aspart  0-24 Units Subcutaneous TID WC  . insulin aspart  4 Units Subcutaneous TID WC  . insulin detemir  20 Units Subcutaneous BID  . levalbuterol  0.63 mg Nebulization TID  . metoprolol tartrate  25 mg Oral BID   Or  . metoprolol tartrate  25 mg Per Tube BID  . pantoprazole  40 mg Oral Daily  . potassium chloride  20 mEq Oral Daily  . rosuvastatin  40 mg Oral QHS  . sodium chloride flush  3 mL Intravenous Q12H   Continuous Infusions: . sodium chloride     PRN Meds:.sodium chloride, guaiFENesin, metoprolol tartrate, oxyCODONE, sodium chloride flush, traMADol  Xrays Dg Chest 2 View  Result Date: 11/29/2018 CLINICAL DATA:  Follow-up CABG. EXAM: CHEST - 2 VIEW COMPARISON:  Yesterday FINDINGS: Trace pleural effusions. Inflation is improved from before. No pneumothorax. Stable cardiomegaly. CABG and implantable loop recorder. IMPRESSION: Trace pleural effusions.  Lung volumes are improved from yesterday. Electronically Signed   By: Monte Fantasia M.D.   On: 11/29/2018 08:58    Assessment/Plan: S/P Procedure(s) (LRB): CORONARY ARTERY BYPASS GRAFTING (CABG), ON PUMP, TIMES FOUR, USING LEFT INTERNAL MAMMARY ARTERY AND ENDOSCOPICALLY HARVESTED LEFT SAPHENOUS VEIN (N/A) TRANSESOPHAGEAL ECHOCARDIOGRAM (TEE) (N/A)  1 doing well 2 hemodyn stable but hypertensive, Creat improving but won't restart ARB at this time. Will add norvasc. Sinus rhythm, 1 episode of bradycardia noted- will leave beta blocker dose at current 3 sats good on RA- cont pulm toilet 4 volume overload- cont lasix for now, UOP may not be accurate 5 BS adeq control- cont insulin/SSI, A1c 7.1 on lantus at home- needsimproved dietary  management as is feasible 6 cont rehab 7 d/c wires 8 poss home 1-2 days   LOS: 10 days    John Giovanni PA-C 11/30/2018 Pager [phone]  patient working on home support coverage titrating meds prob home Monday  patient examined and medical record reviewed,agree with above note. Tharon Aquas Trigt III 11/30/2018

## 2018-12-01 LAB — GLUCOSE, CAPILLARY
GLUCOSE-CAPILLARY: 175 mg/dL — AB (ref 70–99)
Glucose-Capillary: 100 mg/dL — ABNORMAL HIGH (ref 70–99)
Glucose-Capillary: 139 mg/dL — ABNORMAL HIGH (ref 70–99)
Glucose-Capillary: 107 mg/dL — ABNORMAL HIGH (ref 70–99)

## 2018-12-01 MED ORDER — AMLODIPINE BESYLATE 10 MG PO TABS
10.0000 mg | ORAL_TABLET | Freq: Every day | ORAL | Status: DC
  Administered 2018-12-01 – 2018-12-02 (×2): 10 mg via ORAL
  Filled 2018-12-01 (×2): qty 1

## 2018-12-01 MED ORDER — FUROSEMIDE 40 MG PO TABS
40.0000 mg | ORAL_TABLET | Freq: Every day | ORAL | Status: AC
  Administered 2018-12-01: 40 mg via ORAL
  Filled 2018-12-01: qty 1

## 2018-12-01 NOTE — Progress Notes (Addendum)
[address]       [place] [address]             [phone]      5 Days Post-Op Procedure(s) (LRB): CORONARY ARTERY BYPASS GRAFTING (CABG), ON PUMP, TIMES FOUR, USING LEFT INTERNAL MAMMARY ARTERY AND ENDOSCOPICALLY HARVESTED LEFT SAPHENOUS VEIN (N/A) TRANSESOPHAGEAL ECHOCARDIOGRAM (TEE) (N/A) Subjective:  feels pretty well but didn't sleep well BP remains elevated In sinus rhythm  Objective: Vital signs in last 24 hours: Temp:  [98 F (36.7 C)-98.5 F (36.9 C)] 98.4 F (36.9 C) (02/16 0327) Pulse Rate:  [58-80] 73 (02/16 0327) Cardiac Rhythm: Normal sinus rhythm (02/16 0327) Resp:  [14-22] 16 (02/16 0327) BP: (133-163)/(71-80) 156/76 (02/16 0327) SpO2:  [95 %-97 %] 95 % (02/16 0327) Weight:  [97.3 kg] 97.3 kg (02/16 0500)  Hemodynamic parameters for last 24 hours:    Intake/Output from previous day: 02/15 0701 - 02/16 0700 In: 717 [P.O.:717] Out: 2300 [Urine:2300] Intake/Output this shift: Total I/O In: 240 [P.O.:240] Out: 1500 [Urine:1500]  General appearance: alert, cooperative and no distress Heart: regular rate and rhythm Lungs: clear to auscultation bilaterally Abdomen: benign Extremities: + Bilat LE edema  L>R  Wound: incis healing well  Lab Results: Recent Labs    11/29/18 0328  WBC 11.2*  HGB 10.5*  HCT 34.0*  PLT 177   BMET:  Recent Labs    11/29/18 0328 11/30/18 0256  NA 142 141  K 4.2 3.8  CL 111 111  CO2 27 23  GLUCOSE 89 107*  BUN 30* 35*  CREATININE 1.88* 1.69*  CALCIUM 8.3* 8.0*    PT/INR: No results for input(s): LABPROT, INR in the last 72 hours. ABG    Component Value Date/Time   PHART 7.346 (L) 11/26/2018 1859   HCO3 21.0 11/26/2018 1859   TCO2 22 11/26/2018 1859   ACIDBASEDEF 4.0 (H) 11/26/2018 1859   O2SAT 91.0 11/26/2018 1859   CBG (last 3)  Recent Labs    11/30/18 1738 11/30/18 2101 12/01/18 0628  GLUCAP 135* 153* 100*    Meds Scheduled Meds: . acetaminophen  1,000 mg Oral Q6H     Or  . acetaminophen (TYLENOL) oral liquid 160 mg/5 mL  1,000 mg Per Tube Q6H  . amLODipine  5 mg Oral Daily  . aspirin EC  325 mg Oral Daily   Or  . aspirin  324 mg Per Tube Daily  . bisacodyl  10 mg Oral Daily   Or  . bisacodyl  10 mg Rectal Daily  . docusate sodium  200 mg Oral Daily  . ferrous [id number] C-folic acid  1 capsule Oral BID PC  . insulin aspart  0-24 Units Subcutaneous TID WC  . insulin aspart  4 Units Subcutaneous TID WC  . insulin detemir  20 Units Subcutaneous BID  . metoprolol tartrate  25 mg Oral BID   Or  . metoprolol tartrate  25 mg Per Tube BID  . pantoprazole  40 mg Oral Daily  . rosuvastatin  40 mg Oral QHS  . sodium chloride flush  3 mL Intravenous Q12H   Continuous Infusions: . sodium chloride     PRN Meds:.sodium chloride, guaiFENesin, levalbuterol, metoprolol tartrate, oxyCODONE, sodium chloride flush, traMADol  Xrays No results found.  Assessment/Plan: S/P Procedure(s) (LRB): CORONARY ARTERY BYPASS GRAFTING (CABG), ON PUMP, TIMES FOUR, USING LEFT INTERNAL MAMMARY ARTERY AND ENDOSCOPICALLY HARVESTED LEFT SAPHENOUS VEIN (N/A) TRANSESOPHAGEAL ECHOCARDIOGRAM (TEE) (N/A)   1 overall doing well 2  HTN- will increase norvasc dose 3 some LE edema- will give another dose of lasix today- recheck BMET in am to see where we stand with renal fxn 4 sugars are controlled on current insulin dosing 5 sinus rhythm- d/c wires today 6 working on home help- son can stay at night, hoping to find someone to be with him during day 7 cont pulm toilet/routine rehab 8 poss home in am  LOS: 11 days    John Giovanni PA-C 12/01/2018 Pager [phone] Plan home in am Patient not agreeable to SNF, working on family support  and will need HHN for restorative care  patient examined and medical record reviewed,agree with above note. Tharon Aquas Trigt III 12/01/2018

## 2018-12-01 NOTE — Progress Notes (Signed)
Pt ambulated 470 feet around unit with front wheel walker

## 2018-12-01 NOTE — Progress Notes (Signed)
Pacer wires removed. Wires intact. Pt denies complaint tolerated well.  Vitals monitored q 15 mins. Pt agrees to bed rest for 1 hr. Jerald Kief, RN

## 2018-12-01 NOTE — Progress Notes (Signed)
Patient ambulated in hallway with nursing staff and walker 470 feet. Back in chair to eat supper. Will monitor patient. Macy Lingenfelter, Bettina Gavia RN

## 2018-12-02 ENCOUNTER — Other Ambulatory Visit: Payer: Self-pay

## 2018-12-02 LAB — GLUCOSE, CAPILLARY: Glucose-Capillary: 87 mg/dL (ref 70–99)

## 2018-12-02 LAB — BASIC METABOLIC PANEL
Anion gap: 11 (ref 5–15)
BUN: 29 mg/dL — ABNORMAL HIGH (ref 8–23)
CO2: 24 mmol/L (ref 22–32)
CREATININE: 1.5 mg/dL — AB (ref 0.61–1.24)
Calcium: 8.4 mg/dL — ABNORMAL LOW (ref 8.9–10.3)
Chloride: 107 mmol/L (ref 98–111)
GFR calc Af Amer: 52 mL/min — ABNORMAL LOW (ref >60)
GFR calc non Af Amer: 45 mL/min — ABNORMAL LOW (ref >60)
GLUCOSE: 82 mg/dL (ref 70–99)
Potassium: 3.9 mmol/L (ref 3.5–5.1)
Sodium: 142 mmol/L (ref 135–145)

## 2018-12-02 MED ORDER — ROSUVASTATIN CALCIUM 40 MG PO TABS: 40.0000 mg | ORAL_TABLET | Freq: Every day | ORAL | 3 refills | Status: DC

## 2018-12-02 MED ORDER — ASPIRIN 325 MG PO TBEC: 325.0000 mg | DELAYED_RELEASE_TABLET | Freq: Every day | ORAL | 0 refills | Status: DC

## 2018-12-02 MED ORDER — METOPROLOL TARTRATE 25 MG PO TABS: 25.0000 mg | ORAL_TABLET | Freq: Two times a day (BID) | ORAL | 3 refills | Status: DC

## 2018-12-02 MED ORDER — AMLODIPINE BESYLATE 10 MG PO TABS: 10.0000 mg | ORAL_TABLET | Freq: Every day | ORAL | 3 refills | Status: DC

## 2018-12-02 MED ORDER — TRAMADOL HCL 50 MG PO TABS: 50.0000 mg | ORAL_TABLET | Freq: Four times a day (QID) | ORAL | 0 refills | Status: DC | PRN

## 2018-12-02 MED ORDER — ASPIRIN EC 81 MG PO TBEC: 81.0000 mg | DELAYED_RELEASE_TABLET | Freq: Every day | ORAL | Status: DC

## 2018-12-02 NOTE — Progress Notes (Signed)
[place] to be D/C'd Home per MD order. Discussed with the patient and all questions fully answered.    IV catheter discontinued intact. Site without signs and symptoms of complications. Dressing and pressure applied.  An After Visit Summary was printed and given to the patient.  Patient escorted via [facility], and D/C home via private auto.  [name]  12/02/2018 11:09 AM

## 2018-12-02 NOTE — Progress Notes (Signed)
Pt ambulated around unit x 470 feet, pt tolerated well

## 2018-12-02 NOTE — Progress Notes (Signed)
CARDIAC REHAB PHASE I   PRE:  Rate/Rhythm: 82 SR  BP:  Supine:   Sitting: 138/76  Standing:    SaO2: 98%RA  MODE:  Ambulation: 470 ft   POST:  Rate/Rhythm: 105 ST  BP:  Supine:   Sitting: 161/77  Standing:    SaO2: 98%RA [id number] Pt had already walked once this morning. Had been using walker. Wanted to walk pt without walker since he does not want one for home. Pt walked 470 ft on RA with hand held asst. Gait fairly steady. Offered pt a walker for home but he stated did not want one. Will have assistance at home.  Tolerated walk well. To recliner with call bell. No questions re ed done this weekend.   Graylon Good, RN BSN  12/02/2018 8:36 AM

## 2018-12-02 NOTE — Progress Notes (Addendum)
      [address]       [place] [address]             [phone]      6 Days Post-Op Procedure(s) (LRB): CORONARY ARTERY BYPASS GRAFTING (CABG), ON PUMP, TIMES FOUR, USING LEFT INTERNAL MAMMARY ARTERY AND ENDOSCOPICALLY HARVESTED LEFT SAPHENOUS VEIN (N/A) TRANSESOPHAGEAL ECHOCARDIOGRAM (TEE) (N/A)   Subjective:  No new complaints.  He is ready to go home.  He has arranged help during the day and his son will stay with him at night.  + ambulation  + BM  Objective: Vital signs in last 24 hours: Temp:  [98.2 F (36.8 C)-99 F (37.2 C)] 98.2 F (36.8 C) (02/17 0445) Pulse Rate:  [59-74] 74 (02/17 0445) Cardiac Rhythm: Normal sinus rhythm (02/17 0700) Resp:  [13-20] 20 (02/17 0445) BP: (143-184)/(73-94) 173/86 (02/17 0445) SpO2:  [95 %-99 %] 98 % (02/17 0445) Weight:  [95.2 kg] 95.2 kg (02/17 0610)  Intake/Output from previous day: 02/16 0701 - 02/17 0700 In: 760 [P.O.:760] Out: 1740 [Urine:1740]  General appearance: alert, cooperative and no distress Heart: regular rate and rhythm Lungs: clear to auscultation bilaterally Abdomen: soft, non-tender; bowel sounds normal; no masses,  no organomegaly Extremities: edema trace Wound: clean and dry  Lab Results: No results for input(s): WBC, HGB, HCT, PLT in the last 72 hours. BMET:  Recent Labs    11/30/18 0256 12/02/18 0321  NA 141 142  K 3.8 3.9  CL 111 107  CO2 23 24  GLUCOSE 107* 82  BUN 35* 29*  CREATININE 1.69* 1.50*  CALCIUM 8.0* 8.4*    PT/INR: No results for input(s): LABPROT, INR in the last 72 hours. ABG    Component Value Date/Time   PHART 7.346 (L) 11/26/2018 1859   HCO3 21.0 11/26/2018 1859   TCO2 22 11/26/2018 1859   ACIDBASEDEF 4.0 (H) 11/26/2018 1859   O2SAT 91.0 11/26/2018 1859   CBG (last 3)  Recent Labs    12/01/18 1640 12/01/18 2254 12/02/18 0602  GLUCAP 107* 175* 87    Assessment/Plan: S/P Procedure(s) (LRB): CORONARY ARTERY BYPASS GRAFTING (CABG), ON PUMP,  TIMES FOUR, USING LEFT INTERNAL MAMMARY ARTERY AND ENDOSCOPICALLY HARVESTED LEFT SAPHENOUS VEIN (N/A) TRANSESOPHAGEAL ECHOCARDIOGRAM (TEE) (N/A)  1. CV- NSR, BP remains elevated at times, but okay to run 140s with underlying CKD- continue Norvasc, Lopressor, will restart home hydralazine 2. Pulm- no acute issues, continue IS 3. Renal- creatinine stable at 1.50, weight is stable, not on lasix currently 4. Expected post operative blood loss anemia, mild 5. DM- sugars have been well controlled in hospital, stressed importance of sticking to diabetic diet to ensure his wounds heal appropriately  6. Dispo- patient stable, will d/c home today   LOS: 12 days    Ellwood Handler 12/02/2018   Chart reviewed, patient examined, agree with above. He needs to resume Plavix in addition to ASA at discharge with hx of prior stroke.

## 2018-12-02 NOTE — Consult Note (Signed)
   [facility] CM Inpatient Consult   12/02/2018  Jake Gross October 20, 1940 [id number]   Update:  Patient was accepted with [place] program and Medicare confirmed. Patient was previously active with [facility] and will be managed by [facility].  For questions, please contact:  Natividad Brood, RN BSN [facility] Liaison  [phone] business mobile phone Toll free office [phone]

## 2018-12-02 NOTE — Care Management Note (Signed)
Case Management Note Marvetta Gibbons RN, BSN Transitions of Care Unit 4E- RN Case Manager [phone]  Patient Details  Name: Jake Gross MRN: [id number] Date of Birth: Jun 21, 1941  Subjective/Objective:  Pt admitted with [name], s/p CABG                    Action/Plan: PTA pt lived at home alone, orders have been placed for HHRN/PT, pt is eligible through [facility] benefits for [facility] program. CM spoke with pt and son at bedside- discussed [facility] program and [facility] with pt and son. Per conversation they have arranged someone to stay with pt from 10a-4pm- but are interested in the [facility] program as it is a benefit with pt's insurance. Provided choice with list Per CMS guidelines from medicare.gov website with star ratings (copy placed in shadow chart)- pt voices that he would like to do the [facility] program and have [place] with Alvis Lemmings, call made to [facility] with Alvis Lemmings for [facility] program referral. [facility] will be consulted as well for referral Tommi Rumps will call them). Pt reports that he does not have any DME needs, son to transport home today.   Expected Discharge Date:  12/02/18               Expected Discharge Plan:  [place]  In-House Referral:  [facility]  Discharge planning Services  CM Consult  Post Acute Care Choice:  Home Health Choice offered to:  Patient, Adult Children  DME Arranged:  N/A DME Agency:  NA  HH Arranged:  RN, PT, Nurse's Aide [place] Agency:  [facility]  Status of Service:  Completed, signed off  If discussed at [place] of Stay Meetings, dates discussed:    Discharge Disposition: home/home health   Additional Comments:  Dawayne Patricia, RN 12/02/2018, 10:38 AM

## 2018-12-03 ENCOUNTER — Telehealth: Payer: Self-pay | Admitting: Internal Medicine

## 2018-12-03 DIAGNOSIS — Z9489 Other transplanted organ and tissue status: Secondary | ICD-10-CM | POA: Diagnosis not present

## 2018-12-03 DIAGNOSIS — Z951 Presence of aortocoronary bypass graft: Secondary | ICD-10-CM | POA: Diagnosis not present

## 2018-12-03 DIAGNOSIS — Z602 Problems related to living alone: Secondary | ICD-10-CM | POA: Diagnosis not present

## 2018-12-03 DIAGNOSIS — Z794 Long term (current) use of insulin: Secondary | ICD-10-CM | POA: Diagnosis not present

## 2018-12-03 DIAGNOSIS — Z9181 History of falling: Secondary | ICD-10-CM | POA: Diagnosis not present

## 2018-12-03 DIAGNOSIS — Z7982 Long term (current) use of aspirin: Secondary | ICD-10-CM | POA: Diagnosis not present

## 2018-12-03 DIAGNOSIS — I1310 Hypertensive heart and chronic kidney disease without heart failure, with stage 1 through stage 4 chronic kidney disease, or unspecified chronic kidney disease: Secondary | ICD-10-CM | POA: Diagnosis not present

## 2018-12-03 DIAGNOSIS — Z7902 Long term (current) use of antithrombotics/antiplatelets: Secondary | ICD-10-CM | POA: Diagnosis not present

## 2018-12-03 DIAGNOSIS — M159 Polyosteoarthritis, unspecified: Secondary | ICD-10-CM | POA: Diagnosis not present

## 2018-12-03 DIAGNOSIS — I2510 Atherosclerotic heart disease of native coronary artery without angina pectoris: Secondary | ICD-10-CM | POA: Diagnosis not present

## 2018-12-03 DIAGNOSIS — Z8673 Personal history of transient ischemic attack (TIA), and cerebral infarction without residual deficits: Secondary | ICD-10-CM | POA: Diagnosis not present

## 2018-12-03 DIAGNOSIS — N189 Chronic kidney disease, unspecified: Secondary | ICD-10-CM | POA: Diagnosis not present

## 2018-12-03 DIAGNOSIS — E1122 Type 2 diabetes mellitus with diabetic chronic kidney disease: Secondary | ICD-10-CM | POA: Diagnosis not present

## 2018-12-03 DIAGNOSIS — E785 Hyperlipidemia, unspecified: Secondary | ICD-10-CM | POA: Diagnosis not present

## 2018-12-03 DIAGNOSIS — Z48812 Encounter for surgical aftercare following surgery on the circulatory system: Secondary | ICD-10-CM | POA: Diagnosis not present

## 2018-12-03 NOTE — Telephone Encounter (Signed)
Spoke to pt and he is aware as instructed---will have son pick up Tylenol as he is out and he stated the nurse will be back in the morning so she will be able to recheck temp.Marland KitchenMarland Kitchen

## 2018-12-03 NOTE — Telephone Encounter (Signed)
Jake Gross,[place],called.  Patient had bypass surgery. Jake Gross admitted him for skilled nursing and physical therapy. Patient has a temperature of 99.7.

## 2018-12-03 NOTE — Telephone Encounter (Signed)
Left message on voicemail.

## 2018-12-03 NOTE — Telephone Encounter (Signed)
Ok. Monitor for now. If > 100.5, may need to be seen for further evaluation. Can take Tylenol as needed for fever.

## 2018-12-04 DIAGNOSIS — N189 Chronic kidney disease, unspecified: Secondary | ICD-10-CM | POA: Diagnosis not present

## 2018-12-04 DIAGNOSIS — E1122 Type 2 diabetes mellitus with diabetic chronic kidney disease: Secondary | ICD-10-CM | POA: Diagnosis not present

## 2018-12-04 DIAGNOSIS — Z48812 Encounter for surgical aftercare following surgery on the circulatory system: Secondary | ICD-10-CM | POA: Diagnosis not present

## 2018-12-04 DIAGNOSIS — I1310 Hypertensive heart and chronic kidney disease without heart failure, with stage 1 through stage 4 chronic kidney disease, or unspecified chronic kidney disease: Secondary | ICD-10-CM | POA: Diagnosis not present

## 2018-12-04 DIAGNOSIS — I2510 Atherosclerotic heart disease of native coronary artery without angina pectoris: Secondary | ICD-10-CM | POA: Diagnosis not present

## 2018-12-04 DIAGNOSIS — M159 Polyosteoarthritis, unspecified: Secondary | ICD-10-CM | POA: Diagnosis not present

## 2018-12-05 ENCOUNTER — Telehealth (HOSPITAL_COMMUNITY): Payer: Self-pay

## 2018-12-05 ENCOUNTER — Other Ambulatory Visit: Payer: Self-pay | Admitting: Internal Medicine

## 2018-12-05 DIAGNOSIS — R0602 Shortness of breath: Secondary | ICD-10-CM

## 2018-12-05 DIAGNOSIS — R079 Chest pain, unspecified: Secondary | ICD-10-CM

## 2018-12-05 DIAGNOSIS — I10 Essential (primary) hypertension: Secondary | ICD-10-CM

## 2018-12-05 DIAGNOSIS — E782 Mixed hyperlipidemia: Secondary | ICD-10-CM

## 2018-12-05 NOTE — Telephone Encounter (Signed)
Pt insurance is active and benefits verified through Medicare A/B. Co-pay $0.00, DED $198.00/$198.00 met, out of pocket $0.00/$0.00 met, co-insurance 20%. No pre-authorization required. Passport, 12/05/2018 @ 2:14PM, REF# [id number] ° °2ndary insurance is active and benefits verified through [employer]. Co-pay $0.00, DED $350.00/$0.00 met, out of pocket $5,500.00/$91.21 met, co-insurance 10%. No pre-authorization required. Passport, 12/05/2018 @ 2:29PM, REF#[id number] ° °Will contact patient to see if he is interested in the Cardiac Rehab Program. If interested, patient will need to complete follow up appt. Once completed, patient will be contacted for scheduling upon review by the RN Navigator. °

## 2018-12-05 NOTE — Telephone Encounter (Signed)
Called patient to see if he is interested in the Cardiac Rehab Program. Patient expressed interest. Explained scheduling process and went over insurance, patient verbalized understanding. Will contact patient for scheduling once f/u has been completed.  °

## 2018-12-05 NOTE — Telephone Encounter (Signed)
Last filled 11/12/2018 please advise if okay for pt to continue

## 2018-12-06 ENCOUNTER — Telehealth: Payer: Self-pay | Admitting: Nurse Practitioner

## 2018-12-06 NOTE — Telephone Encounter (Signed)
LINQ alert for AF episodes. Pt with 2 episodes of AF, the longest 12 hours on 12/05/18.  I spoke with patient this morning. He is recovering from CABG. He is being seen Monday at [name] for suture removal. Appt made for Monday to follow in AF clinic. Pt aware. He asked that I call and speak with his brother, Barnabas Lister [phone]) to give directions. Reviewed location of AF clinic and gave AF clinic phone number.  Chanetta Marshall, NP 12/06/2018 8:53 AM

## 2018-12-07 ENCOUNTER — Other Ambulatory Visit: Payer: Self-pay | Admitting: Internal Medicine

## 2018-12-09 ENCOUNTER — Encounter (HOSPITAL_COMMUNITY): Payer: Self-pay | Admitting: Physician Assistant

## 2018-12-09 ENCOUNTER — Ambulatory Visit (INDEPENDENT_AMBULATORY_CARE_PROVIDER_SITE_OTHER): Payer: Self-pay | Admitting: *Deleted

## 2018-12-09 ENCOUNTER — Ambulatory Visit (HOSPITAL_COMMUNITY)
Admission: RE | Admit: 2018-12-09 | Discharge: 2018-12-09 | Disposition: A | Discharge status: Home / Self Care | Payer: Medicare Other | Source: Ambulatory Visit | Attending: Physician Assistant | Admitting: Physician Assistant

## 2018-12-09 ENCOUNTER — Other Ambulatory Visit: Payer: Self-pay | Admitting: *Deleted

## 2018-12-09 VITALS — BP 132/58 | HR 67 | Ht 67.0 in | Wt 218.0 lb

## 2018-12-09 DIAGNOSIS — Z951 Presence of aortocoronary bypass graft: Secondary | ICD-10-CM | POA: Diagnosis not present

## 2018-12-09 DIAGNOSIS — Z79899 Other long term (current) drug therapy: Secondary | ICD-10-CM | POA: Diagnosis not present

## 2018-12-09 DIAGNOSIS — Z7982 Long term (current) use of aspirin: Secondary | ICD-10-CM | POA: Insufficient documentation

## 2018-12-09 DIAGNOSIS — I2510 Atherosclerotic heart disease of native coronary artery without angina pectoris: Secondary | ICD-10-CM | POA: Diagnosis not present

## 2018-12-09 DIAGNOSIS — E785 Hyperlipidemia, unspecified: Secondary | ICD-10-CM | POA: Diagnosis not present

## 2018-12-09 DIAGNOSIS — Z794 Long term (current) use of insulin: Secondary | ICD-10-CM | POA: Insufficient documentation

## 2018-12-09 DIAGNOSIS — Z833 Family history of diabetes mellitus: Secondary | ICD-10-CM | POA: Insufficient documentation

## 2018-12-09 DIAGNOSIS — E119 Type 2 diabetes mellitus without complications: Secondary | ICD-10-CM | POA: Diagnosis not present

## 2018-12-09 DIAGNOSIS — R600 Localized edema: Secondary | ICD-10-CM

## 2018-12-09 DIAGNOSIS — Z7901 Long term (current) use of anticoagulants: Secondary | ICD-10-CM | POA: Diagnosis not present

## 2018-12-09 DIAGNOSIS — Z6834 Body mass index (BMI) 34.0-34.9, adult: Secondary | ICD-10-CM | POA: Insufficient documentation

## 2018-12-09 DIAGNOSIS — Z8249 Family history of ischemic heart disease and other diseases of the circulatory system: Secondary | ICD-10-CM | POA: Insufficient documentation

## 2018-12-09 DIAGNOSIS — Z859 Personal history of malignant neoplasm, unspecified: Secondary | ICD-10-CM | POA: Diagnosis not present

## 2018-12-09 DIAGNOSIS — Z8673 Personal history of transient ischemic attack (TIA), and cerebral infarction without residual deficits: Secondary | ICD-10-CM | POA: Diagnosis not present

## 2018-12-09 DIAGNOSIS — Z809 Family history of malignant neoplasm, unspecified: Secondary | ICD-10-CM | POA: Diagnosis not present

## 2018-12-09 DIAGNOSIS — I480 Paroxysmal atrial fibrillation: Secondary | ICD-10-CM | POA: Insufficient documentation

## 2018-12-09 DIAGNOSIS — Z87891 Personal history of nicotine dependence: Secondary | ICD-10-CM | POA: Diagnosis not present

## 2018-12-09 DIAGNOSIS — E669 Obesity, unspecified: Secondary | ICD-10-CM | POA: Diagnosis not present

## 2018-12-09 DIAGNOSIS — Z4802 Encounter for removal of sutures: Secondary | ICD-10-CM

## 2018-12-09 DIAGNOSIS — I10 Essential (primary) hypertension: Secondary | ICD-10-CM | POA: Diagnosis not present

## 2018-12-09 MED ORDER — FUROSEMIDE 40 MG PO TABS: 40.0000 mg | ORAL_TABLET | Freq: Every day | ORAL | 0 refills | Status: DC

## 2018-12-09 MED ORDER — APIXABAN 5 MG PO TABS: 5.0000 mg | ORAL_TABLET | Freq: Two times a day (BID) | ORAL | 0 refills | Status: DC

## 2018-12-09 MED ORDER — POTASSIUM CHLORIDE CRYS ER 10 MEQ PO TBCR: 20.0000 meq | EXTENDED_RELEASE_TABLET | Freq: Every day | ORAL | 0 refills | Status: DC

## 2018-12-09 NOTE — Progress Notes (Signed)
Primary Care Physician: Jearld , NP Primary Cardiologist: Dr McAlhaney/Dr Cyndia Bent Primary Electrophysiologist: Dr Rayann Heman Referring Physician: Chanetta Marshall NP   Jake Gross is a 78 y.o. male with a history of CAD s/p CABG 11/26/2018, DM, HTN, CVA (06/2017), and HLD who presents for consultation in the [facility].  The patient was initially diagnosed with atrial fibrillation on ILR with 2 episodes detected, the longest on 12/05/18 which lasted for 12 hrs. He was asymptomatic during his episodes. He has appropriate post-operative chest pain.  Today, he denies symptoms of palpitations, shortness of breath, orthopnea, PND, lower extremity edema, dizziness, presyncope, syncope, snoring, daytime somnolence, bleeding, or neurologic sequela. The patient is tolerating medications without difficulties and is otherwise without complaint today.    Atrial Fibrillation Risk Factors:  he does not have symptoms or diagnosis of sleep apnea. The patient does not have a history of early familial atrial fibrillation or other arrhythmias.  he has a BMI of Body mass index is 34.14 kg/m.Marland Kitchen Filed Weights   12/09/18 1044  Weight: 98.9 kg    Family History  Problem Relation Age of Onset  . Cancer Father        "all over"  . Coronary artery disease Brother   . Diabetes Brother   . Stroke Mother   . Diabetes Mother   . Cancer Brother   . Colon cancer Neg Hx   . Esophageal cancer Neg Hx   . Rectal cancer Neg Hx   . Stomach cancer Neg Hx      Atrial Fibrillation Management history:  Previous antiarrhythmic drugs: none Previous cardioversions: none Previous ablations: none CHADS2VASC score: 7 (age, HTN, CVA, CAD, DM) Anticoagulation history: none (on Plavix and ASA)   Past Medical History:  Diagnosis Date  . 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 Arthritis   . Cervical spondylosis   . Diabetes mellitus    . Hyperlipidemia   . Hypertension   . Stroke ([place]) 06/19/2017  . Vitamin D deficiency    Past Surgical History:  Procedure Laterality Date  . ABCESS DRAINAGE Left    buttocks  . CARDIOVASCULAR STRESS TEST  10/12/1999   EF 63%. NO ISCHEMIA  . COLONOSCOPY W/ POLYPECTOMY     5 polyps  . CORONARY ARTERY BYPASS GRAFT N/A 11/26/2018   Procedure: CORONARY ARTERY BYPASS GRAFTING (CABG), ON PUMP, TIMES FOUR, USING LEFT INTERNAL MAMMARY ARTERY AND ENDOSCOPICALLY HARVESTED LEFT SAPHENOUS VEIN;  Surgeon: Gaye Pollack, MD;  Location: [facility];  Service: Open Heart Surgery;  Laterality: N/A;  . EUS N/A 03/11/2015   Procedure: LOWER ENDOSCOPIC ULTRASOUND (EUS);  Surgeon: Milus Banister, MD;  [facility];  Service: Endoscopy;  Laterality: N/A;  . FLEXIBLE SIGMOIDOSCOPY N/A 02/02/2015   Procedure: FLEXIBLE SIGMOIDOSCOPY;  Surgeon: Inda Castle, MD;  [facility];  Service: Endoscopy;  Laterality: N/A;  ERBE  . LEFT HEART CATH AND CORONARY ANGIOGRAPHY N/A 11/20/2018   Procedure: LEFT HEART CATH AND CORONARY ANGIOGRAPHY;  Surgeon: Burnell Blanks, MD;  Location: [facility];  Service: Cardiovascular;  Laterality: N/A;  . LOOP RECORDER INSERTION N/A 08/14/2017   Procedure: LOOP RECORDER INSERTION;  Surgeon: Thompson Grayer, MD;  Location: [facility];  Service: Cardiovascular;  Laterality: N/A;  . PARTIAL PROCTECTOMY BY TEM N/A 04/08/2015   Procedure: TEM PARTIAL PROCTECTOMY OF RECTAL MASS;  Surgeon: Michael Boston, MD;  [facility];  Service: General;  Laterality: N/A;  . TEE WITHOUT CARDIOVERSION N/A 06/25/2017   Procedure: TRANSESOPHAGEAL ECHOCARDIOGRAM (TEE);  Surgeon: Skeet Latch, MD;  Location: [facility];  Service: Cardiovascular;  Laterality: N/A;  . TEE WITHOUT CARDIOVERSION N/A 11/26/2018   Procedure: TRANSESOPHAGEAL ECHOCARDIOGRAM (TEE);  Surgeon: Gaye Pollack, MD;  Location: [facility];  Service: Open Heart Surgery;  Laterality: N/A;  .  TONSILLECTOMY AND ADENOIDECTOMY     as child    Current Outpatient Medications  Medication Sig Dispense Refill  . amLODipine (NORVASC) 10 MG tablet Take 1 tablet (10 mg total) by mouth daily. 30 tablet 3  . aspirin 81 MG tablet Take 1 tablet (81 mg total) by mouth daily.    . fenofibrate micronized (LOFIBRA) 67 MG capsule TAKE 1 CAPSULE DAILY BEFORE BREAKFAST 90 capsule 0  . hydrALAZINE (APRESOLINE) 25 MG tablet TAKE 1 TABLET BY MOUTH THREE TIMES A DAY 90 tablet 5  . Insulin Glargine (LANTUS SOLOSTAR) 100 UNIT/ML Solostar Pen Inject 100 Units into the skin every morning. 45 pen 11  . metoprolol tartrate (LOPRESSOR) 25 MG tablet Take 1 tablet (25 mg total) by mouth 2 (two) times daily. 60 tablet 3  . ONE TOUCH ULTRA TEST test strip USE 1 STRIP TWICE A DAY 200 each 3  . ONETOUCH DELICA LANCETS 03J MISC     . rosuvastatin (CRESTOR) 40 MG tablet Take 1 tablet (40 mg total) by mouth at bedtime. 30 tablet 3  . traMADol (ULTRAM) 50 MG tablet Take 1 tablet (50 mg total) by mouth every 6 (six) hours as needed for moderate pain or severe pain. 28 tablet 0  . apixaban (ELIQUIS) 5 MG TABS tablet Take 1 tablet (5 mg total) by mouth 2 (two) times daily. 60 tablet 0  . furosemide (LASIX) 40 MG tablet Take 1 tablet (40 mg total) by mouth daily. 5 tablet 0  . potassium chloride (K-DUR,KLOR-CON) 10 MEQ tablet Take 2 tablets (20 mEq total) by mouth daily. 10 tablet 0   No current facility-administered medications for this encounter.     No Known Allergies  Social History   Socioeconomic History  . Marital status: Divorced    Spouse name: Not on file  . Number of children: 2  . Years of education: Not on file  . Highest education level: Not on file  Occupational History  . Occupation: retired    Fish farm manager: [employer]  Social Needs  . Financial resource strain: Not on file  . Food insecurity:    Worry: Not on file    Inability: Not on file  . Transportation needs:    Medical: Not on file     Non-medical: Not on file  Tobacco Use  . Smoking status: Former Smoker    Types: Cigarettes    Last attempt to quit: 08/13/1994    Years since quitting: 24.3  . Smokeless tobacco: Never Used  Substance and Sexual Activity  . Alcohol use: No    Alcohol/week: 0.0 standard drinks  . Drug use: No  . Sexual activity: Not Currently  Lifestyle  . Physical activity:    Days per week: Not on file    Minutes per session: Not on file  . Stress: Not on file  Relationships  . Social connections:    Talks on phone: Not on file    Gets together: Not on file    Attends religious service: Not on file    Active member of club or organization: Not on file  Attends meetings of clubs or organizations: Not on file    Relationship status: Not on file  . Intimate partner violence:    Fear of current or ex partner: Not on file    Emotionally abused: Not on file    Physically abused: Not on file    Forced sexual activity: Not on file  Other Topics Concern  . Not on file  Social History Narrative  . Not on file     ROS- All systems are reviewed and negative except as per the HPI above.  Physical Exam: Vitals:   12/09/18 1044  BP: (!) 132/58  Pulse: 67  Weight: 98.9 kg  Height: 5\' 7"  (1.702 m)    GEN- The patient is well appearing, elderly male, alert and oriented x 3 today.   Head- normocephalic, atraumatic Eyes-  Sclera clear, conjunctiva pink Ears- hearing intact Oropharynx- clear Neck- supple  Lungs- Clear to ausculation bilaterally, normal work of breathing Heart- Regular rate and rhythm, occasional ectopic beat, no murmurs, rubs or gallops  GI- soft, NT, ND, + BS Extremities- no clubbing, cyanosis, or edema MS- no significant deformity or atrophy Skin- no rash or lesion Psych- euthymic mood, full affect Neuro- strength and sensation are intact  Wt Readings from Last 3 Encounters:  12/09/18 98.9 kg  12/02/18 95.2 kg  11/14/18 101.6 kg    EKG today demonstrates SR HR  67, PACs, T wave changes inferior/lateral unchanged from 2/11.  Echo 11/21/18 demonstrated  1. The left ventricle has normal systolic function of 59-56%. The cavity size was mildly increased. There is no increased left ventricular wall thickness. Echo evidence of impaired diastolic relaxation.  2. The right ventricle has normal systolic function. The cavity was normal. There is no increase in right ventricular wall thickness.  3. Right atrial size was mildly dilated.  4. The mitral valve is normal in structure. There is mild to moderate mitral annular calcification present. No evidence of mitral valve stenosis. No mitral regurgitation.  5. The tricuspid valve is normal in structure.  6. The aortic valve is tricuspid There is mild calcification of the aortic valve. No aortic stenosis.  7. There is mild dilatation of the aortic root and ascending aorta.  8. No evidence of left ventricular regional wall motion abnormalities.  9. No complete TR doppler jet so unable to estimate PA systolic pressure. LA 45mm  Epic records are reviewed at length today  Assessment and Plan:  1. Paroxysmal atrial fibrillation The patient has paroxysmal atrial fibrillation seen on ILR, possibly related to surgery but given previous CVA and that he was asymptomatic, duration is unknown.  Will stop Plavix and start Eliquis 5 mg BID and continue ASA 81 mg daily. Would not start AAD at this point. Check CBC/Bmet in one month.  This patients CHA2DS2-VASc Score and unadjusted Ischemic Stroke Rate (% per year) is equal to 11.2 % stroke rate/year from a score of 7  Above score calculated as 1 point each if present [CHF, HTN, DM, Vascular=MI/PAD/Aortic Plaque, Age if 65-74, or Male] Above score calculated as 2 points each if present [Age > 75, or Stroke/TIA/TE]   2. Obesity Body mass index is 34.14 kg/m. Lifestyle modification was discussed at length including regular exercise and weight reduction.  3. CAD S/p CABG  x4 11/26/2018. No anginal symptoms. Changes as above. Continue risk factor modification.  4. HTN Stable, no changes today.    Follow up with Ermalinda Barrios and Dr Cyndia Bent as scheduled. Return for labs  in one month.   [facility] [address] [place], [place] [address] [phone] 12/09/2018 11:31 AM

## 2018-12-09 NOTE — Patient Instructions (Signed)
Stop plavix   Start Eliquis 5mg twice a day 

## 2018-12-09 NOTE — Telephone Encounter (Signed)
Please advise if okay to refill, not sure if there has been any changes since surgery.Marland KitchenMarland Kitchen

## 2018-12-09 NOTE — Progress Notes (Unsigned)
Jake Gross returns to the office for suture removal s/p CABG X 4 on 11/26/18 amd was discharged on 12/02/18.  He is accompanied by his son and an aide from [facility].  He is doing well. Diet and bowels are good. He is doing some walking. All operative sites are healing very well.  I removed three sutures from his previous chest tube sites. He says his blood sugars are good, last fasting am was 78.  He does have 2+ edema to both lower extremities.  Proper elevation was incouraged with him and his assistant.  A 5 day course of Lasix/K will be provided. His son has concerns of his circulation in his right foot.  It is warm today.  I will make a note for this to be addressed at his f/u appointment with [place].  He will f/u as scheduled with a chest xray.  He does have a f/u with cardiology. He will be seen in the AFIB clinic today.

## 2018-12-10 ENCOUNTER — Other Ambulatory Visit (HOSPITAL_COMMUNITY): Payer: Self-pay | Admitting: *Deleted

## 2018-12-10 DIAGNOSIS — I2510 Atherosclerotic heart disease of native coronary artery without angina pectoris: Secondary | ICD-10-CM | POA: Diagnosis not present

## 2018-12-10 DIAGNOSIS — M159 Polyosteoarthritis, unspecified: Secondary | ICD-10-CM | POA: Diagnosis not present

## 2018-12-10 DIAGNOSIS — Z48812 Encounter for surgical aftercare following surgery on the circulatory system: Secondary | ICD-10-CM | POA: Diagnosis not present

## 2018-12-10 DIAGNOSIS — E1122 Type 2 diabetes mellitus with diabetic chronic kidney disease: Secondary | ICD-10-CM | POA: Diagnosis not present

## 2018-12-10 DIAGNOSIS — I1310 Hypertensive heart and chronic kidney disease without heart failure, with stage 1 through stage 4 chronic kidney disease, or unspecified chronic kidney disease: Secondary | ICD-10-CM | POA: Diagnosis not present

## 2018-12-10 DIAGNOSIS — N189 Chronic kidney disease, unspecified: Secondary | ICD-10-CM | POA: Diagnosis not present

## 2018-12-10 MED ORDER — APIXABAN 5 MG PO TABS: 5.0000 mg | ORAL_TABLET | Freq: Two times a day (BID) | ORAL | 6 refills | Status: DC

## 2018-12-10 NOTE — Progress Notes (Signed)
Cardiology Office Note    Date:  12/17/2018   ID:  Jake Gross, DOB October 07, 1941, MRN [id number]  PCP:  Jearld Fenton, NP  Cardiologist: Lauree [place], MD EPS: Thompson Grayer, MD  Chief Complaint  Patient presents with  . Hospitalization Follow-up    History of Present Illness:  Jake Gross is a 78 y.o. 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  CHA2DS2-VASc equals 7. patient was seen in the A. fib clinic 12/09/2018 and Plavix was stopped and Eliquis 5 mg twice daily started.  Aspirin was continued at 81 mg.  2D echo 11/21/2018 normal LVEF 60 to 65% mild dilation of the aortic root and ascending aorta.  Patient comes in today accompanied by his son. He walked 50 yards yest and became short of breath.  Legs have swollen in the past week.  He said it happened in the past on amlodipine which he was placed on in the hospital.  Says he is watching his salt.    Past Medical History:  Diagnosis Date  . 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 Arthritis   . Cervical spondylosis   . Diabetes mellitus   . Hyperlipidemia   . Hypertension   . Stroke ([place]) 06/19/2017  . Vitamin D deficiency     Past Surgical History:  Procedure Laterality Date  . ABCESS DRAINAGE Left    buttocks  . CARDIOVASCULAR STRESS TEST  10/12/1999   EF 63%. NO ISCHEMIA  . COLONOSCOPY W/ POLYPECTOMY     5 polyps  . CORONARY ARTERY BYPASS GRAFT N/A 11/26/2018   Procedure: CORONARY ARTERY BYPASS GRAFTING (CABG), ON PUMP, TIMES FOUR, USING LEFT INTERNAL MAMMARY ARTERY AND ENDOSCOPICALLY HARVESTED LEFT SAPHENOUS VEIN;  Surgeon: Gaye Pollack, MD;  Location: [facility];  Service: Open Heart Surgery;  Laterality: N/A;  . EUS N/A 03/11/2015   Procedure: LOWER ENDOSCOPIC ULTRASOUND (EUS);  Surgeon: Milus Banister, MD;   [facility];  Service: Endoscopy;  Laterality: N/A;  . FLEXIBLE SIGMOIDOSCOPY N/A 02/02/2015   Procedure: FLEXIBLE SIGMOIDOSCOPY;  Surgeon: Inda Castle, MD;  [facility];  Service: Endoscopy;  Laterality: N/A;  ERBE  . LEFT HEART CATH AND CORONARY ANGIOGRAPHY N/A 11/20/2018   Procedure: LEFT HEART CATH AND CORONARY ANGIOGRAPHY;  Surgeon: Burnell Blanks, MD;  Location: [facility];  Service: Cardiovascular;  Laterality: N/A;  . LOOP RECORDER INSERTION N/A 08/14/2017   Procedure: LOOP RECORDER INSERTION;  Surgeon: Thompson Grayer, MD;  Location: [facility];  Service: Cardiovascular;  Laterality: N/A;  . PARTIAL PROCTECTOMY BY TEM N/A 04/08/2015   Procedure: TEM PARTIAL PROCTECTOMY OF RECTAL MASS;  Surgeon: Michael Boston, MD;  [facility];  Service: General;  Laterality: N/A;  . TEE WITHOUT CARDIOVERSION N/A 06/25/2017   Procedure: TRANSESOPHAGEAL ECHOCARDIOGRAM (TEE);  Surgeon: Skeet Latch, MD;  Location: [facility];  Service: Cardiovascular;  Laterality: N/A;  . TEE WITHOUT CARDIOVERSION N/A 11/26/2018   Procedure: TRANSESOPHAGEAL ECHOCARDIOGRAM (TEE);  Surgeon: Gaye Pollack, MD;  Location: [facility];  Service: Open Heart Surgery;  Laterality: N/A;  . TONSILLECTOMY AND ADENOIDECTOMY     as child    Current Medications: Current Meds  Medication Sig  . apixaban (ELIQUIS) 5 MG TABS tablet Take 1 tablet (5 mg total) by mouth 2 (two) times  daily.  . aspirin 81 MG tablet Take 1 tablet (81 mg total) by mouth daily.  . fenofibrate micronized (LOFIBRA) 67 MG capsule TAKE 1 CAPSULE DAILY BEFORE BREAKFAST  . furosemide (LASIX) 40 MG tablet Take 1 tablet (40 mg total) by mouth daily.  . Insulin Glargine (LANTUS SOLOSTAR) 100 UNIT/ML Solostar Pen Inject 100 Units into the skin every morning.  . metoprolol tartrate (LOPRESSOR) 25 MG tablet Take 1 tablet (25 mg total) by mouth 2 (two) times daily.  . ONE TOUCH ULTRA TEST test strip USE 1 STRIP TWICE A  DAY  . ONETOUCH DELICA LANCETS 26R MISC   . potassium chloride (K-DUR,KLOR-CON) 10 MEQ tablet Take 2 tablets (20 mEq total) by mouth daily.  . rosuvastatin (CRESTOR) 40 MG tablet Take 1 tablet (40 mg total) by mouth at bedtime.  . traMADol (ULTRAM) 50 MG tablet Take 1 tablet (50 mg total) by mouth every 6 (six) hours as needed for moderate pain or severe pain.  . [DISCONTINUED] amLODipine (NORVASC) 10 MG tablet Take 1 tablet (10 mg total) by mouth daily.  . [DISCONTINUED] hydrALAZINE (APRESOLINE) 25 MG tablet TAKE 1 TABLET BY MOUTH THREE TIMES A DAY     Allergies:   Patient has no known allergies.   Social History   Socioeconomic History  . Marital status: Divorced    Spouse name: Not on file  . Number of children: 2  . Years of education: Not on file  . Highest education level: Not on file  Occupational History  . Occupation: retired    Fish farm manager: [employer]  Social Needs  . Financial resource strain: Not on file  . Food insecurity:    Worry: Not on file    Inability: Not on file  . Transportation needs:    Medical: Not on file    Non-medical: Not on file  Tobacco Use  . Smoking status: Former Smoker    Types: Cigarettes    Last attempt to quit: 08/13/1994    Years since quitting: 24.3  . Smokeless tobacco: Never Used  Substance and Sexual Activity  . Alcohol use: No    Alcohol/week: 0.0 standard drinks  . Drug use: No  . Sexual activity: Not Currently  Lifestyle  . Physical activity:    Days per week: Not on file    Minutes per session: Not on file  . Stress: Not on file  Relationships  . Social connections:    Talks on phone: Not on file    Gets together: Not on file    Attends religious service: Not on file    Active member of club or organization: Not on file    Attends meetings of clubs or organizations: Not on file    Relationship status: Not on file  Other Topics Concern  . Not on file  Social History Narrative  . Not on file     Family History:  The  patient's family history includes Cancer in his brother and father; Coronary artery disease in his brother; Diabetes in his brother and mother; Stroke in his mother.   ROS:   Please see the history of present illness.    Review of Systems  Constitution: Negative.  HENT: Negative.   Cardiovascular: Positive for dyspnea on exertion and leg swelling.  Respiratory: Negative.   Endocrine: Negative.   Hematologic/Lymphatic: Negative.   Musculoskeletal: Negative.   Gastrointestinal: Negative.   Genitourinary: Negative.   Neurological: Negative.    All other systems reviewed and are negative.  PHYSICAL EXAM:   VS:  BP 140/72   Pulse 64   Ht '5\' 7"'$  (1.702 m)   Wt 220 lb 6.4 oz (100 kg)   SpO2 96%   BMI 34.52 kg/m   Physical Exam  GEN: Obese, in no acute distress  Neck: Slight increase JVD, no carotid bruits, or masses Cardiac:RRR; 1/6 systolic murmur at the left sternal border Respiratory: Decreased breath sounds throughout but clear GI: soft, nontender, nondistended, + BS Ext: +2-3 brawny edema with  cyanosi decreased distal pulses bilaterally Neuro:  Alert and Oriented x 3 Psych: euthymic mood, full affect  Wt Readings from Last 3 Encounters:  12/17/18 220 lb 6.4 oz (100 kg)  12/12/18 223 lb (101.2 kg)  12/09/18 218 lb (98.9 kg)      Studies/Labs Reviewed:   EKG:  EKG is not ordered today.   Recent Labs: 03/25/2018: ALT 18 11/27/2018: Magnesium 2.8 11/29/2018: Hemoglobin 10.5; Platelets 177 12/02/2018: BUN 29; Creatinine, Ser 1.50; Potassium 3.9; Sodium 142   Lipid Panel    Component Value Date/Time   CHOL 138 11/12/2018 1528   TRIG 214.0 (H) 11/12/2018 1528   HDL 25.60 (L) 11/12/2018 1528   CHOLHDL 5 11/12/2018 1528   VLDL 42.8 (H) 11/12/2018 1528   LDLCALC 56 06/22/2017 0516   LDLDIRECT 80.0 11/12/2018 1528    Additional studies/ records that were reviewed today include:    Cath: 11/20/2018    Prox RCA to Mid RCA lesion is 30% stenosed.  RPDA lesion is 99%  stenosed.  Post Atrio lesion is 100% stenosed.  Ost 2nd Diag to 2nd Diag lesion is 99% stenosed.  Prox LAD lesion is 95% stenosed.  Ost 1st Diag lesion is 80% stenosed.  Ost Cx to Prox Cx lesion is 70% stenosed.  Ost LM lesion is 70% stenosed.   1. Moderately severe ostial left main stenosis.  2. Severe stenosis in the heavily calcified mid LAD 3. Moderately severe proximal Circumflex stenosis 4. The RCA has mild calcific disease through the proximal and mid segments. The PDA is a moderate caliber vessel with severe stenosis. 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  5. Elevated LVEDP   Recommendations: He has severe left main and three vessel CAD. I will ask CT surgery to see him to discuss CABG. Given chronic kidney disease, I will admit him for hydration. Will hold Plavix. He will need Plavix washout prior to surgery. Will arrange echo tomorrow to assess LVEF. Will start ASA. Continue beta blocker and statin. Given elevated filling pressures, one dose of IV Lasix tonight.    TTE: 11/21/2018 Echo 11/21/18 demonstrated  1. The left ventricle has normal systolic function of 41-28%. The cavity size was mildly increased. There is no increased left ventricular wall thickness. Echo evidence of impaired diastolic relaxation.  2. The right ventricle has normal systolic function. The cavity was normal. There is no increase in right ventricular wall thickness.  3. Right atrial size was mildly dilated.  4. The mitral valve is normal in structure. There is mild to moderate mitral annular calcification present. No evidence of mitral valve stenosis. No mitral regurgitation.  5. The tricuspid valve is normal in structure.  6. The aortic valve is tricuspid There is mild calcification of the aortic valve. No aortic stenosis.  7. There is mild dilatation of the aortic root and ascending aorta.  8. No evidence of left ventricular regional wall motion  abnormalities.  9. No complete TR doppler jet  so unable to estimate PA systolic pressure. LA 61m   ASSESSMENT:    1. S/P CABG x 4   2. Paroxysmal atrial fibrillation (HCC)   3. Acute on chronic diastolic CHF (congestive heart failure) ([place]   4. Cerebrovascular accident (CVA) due to other mechanism ([place]   5. Essential hypertension   6. Chronic kidney disease, unspecified CKD stage      PLAN:  In order of problems listed above:  CAD status post CABG times 4 LIMA to the LAD, SVG diagonal 1, SVG diagonal 2, SVG to PDA 11/26/18 for left main disease on aspirin 81 mg daily.  Reduced beta-blocker because of bradycardia in the hospital.  On aspirin and Eliquis.  Plavix stopped when he was found to have PAF.  Postop atrial fibrillation found on ILR longest episode 12 hours.  Seen in A. fib clinic and started on Eliquis 5 mg twice daily.  Plavix stopped and patient has remained on aspirin.  CHA2DS2-VASc equals 7.  Chronic diastolic CHF with significant edema and increased shortness of breath.  Had swelling on amlodipine in the past.  Will decrease amlodipine to 5 mg once daily, increase hydralazine to 50 mg 3 times daily for blood pressure control.  Increase Lasix to 80 mg daily for 3 days then back to 40 mg daily, increase potassium to 40 mEq daily for 3 days then back to 20 mEq daily.  Check be met and BNP today.  I will see him back in 2 weeks.  2 g sodium diet given.  Compression stockings and handicap card given as well.  I wonder if his atrial fibrillation is contributing to this.  History of cryptogenic stroke 2018 previously on Plavix and aspirin  Essential hypertension blood pressure borderline, increasing hydralazine since I am decreasing amlodipine.  CKD no ACE inhibitor or ARB creatinine 1.5 on 12/02/2018.  Recheck today  Medication Adjustments/Labs and Tests Ordered: Current medicines are reviewed at length with the patient today.  Concerns regarding medicines are outlined above.   Medication changes, Labs and Tests ordered today are listed in the Patient Instructions below. Patient Instructions  Medication Instructions:  Your physician has recommended you make the following change in your medication:   1. DECREASE: amlodipine (norvasc) to 5 mg once a day  2. INCREASE: hydralazine to 50 mg three times a day  3. For the next 3 days: Take furosemide (lasix) 80 mg daily and potassium (k-dur) 40 mEq daily, Then go back to taking lasix 40 mg daily and potassium 20 mEq daily  Lab work: None Ordered  If you have labs (blood work) drawn today and your tests are completely normal, you will receive your results only by: .Marland KitchenMyChart Message (if you have MyChart) OR . A paper copy in the mail If you have any lab test that is abnormal or we need to change your treatment, we will call you to review the results.  Testing/Procedures: None ordered  Follow-Up: . Follow up with MErmalinda Barrios PA on 01/01/19 at 12:00 PM  Any Other Special Instructions Will Be Listed Below (If Applicable).       SSumner Boast PA-C  12/17/2018 11:41 AM    [facility] [address] [place]  [id number]: [phone] Fax: [phone]

## 2018-12-11 NOTE — Progress Notes (Signed)
I agree with the above plan 

## 2018-12-12 ENCOUNTER — Telehealth: Payer: Self-pay | Admitting: Internal Medicine

## 2018-12-12 ENCOUNTER — Encounter: Payer: Self-pay | Admitting: Internal Medicine

## 2018-12-12 ENCOUNTER — Ambulatory Visit (INDEPENDENT_AMBULATORY_CARE_PROVIDER_SITE_OTHER): Payer: Medicare Other | Admitting: Internal Medicine

## 2018-12-12 VITALS — BP 142/70 | HR 65 | Temp 98.0°F | Resp 16 | Ht 67.0 in | Wt 223.0 lb

## 2018-12-12 DIAGNOSIS — Z794 Long term (current) use of insulin: Secondary | ICD-10-CM | POA: Diagnosis not present

## 2018-12-12 DIAGNOSIS — M159 Polyosteoarthritis, unspecified: Secondary | ICD-10-CM | POA: Diagnosis not present

## 2018-12-12 DIAGNOSIS — E782 Mixed hyperlipidemia: Secondary | ICD-10-CM

## 2018-12-12 DIAGNOSIS — E119 Type 2 diabetes mellitus without complications: Secondary | ICD-10-CM | POA: Diagnosis not present

## 2018-12-12 DIAGNOSIS — I480 Paroxysmal atrial fibrillation: Secondary | ICD-10-CM

## 2018-12-12 DIAGNOSIS — N189 Chronic kidney disease, unspecified: Secondary | ICD-10-CM | POA: Diagnosis not present

## 2018-12-12 DIAGNOSIS — Z951 Presence of aortocoronary bypass graft: Secondary | ICD-10-CM | POA: Diagnosis not present

## 2018-12-12 DIAGNOSIS — I10 Essential (primary) hypertension: Secondary | ICD-10-CM | POA: Diagnosis not present

## 2018-12-12 DIAGNOSIS — E1122 Type 2 diabetes mellitus with diabetic chronic kidney disease: Secondary | ICD-10-CM | POA: Diagnosis not present

## 2018-12-12 DIAGNOSIS — I2510 Atherosclerotic heart disease of native coronary artery without angina pectoris: Secondary | ICD-10-CM

## 2018-12-12 DIAGNOSIS — R609 Edema, unspecified: Secondary | ICD-10-CM

## 2018-12-12 DIAGNOSIS — Z48812 Encounter for surgical aftercare following surgery on the circulatory system: Secondary | ICD-10-CM | POA: Diagnosis not present

## 2018-12-12 DIAGNOSIS — I1310 Hypertensive heart and chronic kidney disease without heart failure, with stage 1 through stage 4 chronic kidney disease, or unspecified chronic kidney disease: Secondary | ICD-10-CM | POA: Diagnosis not present

## 2018-12-12 NOTE — Telephone Encounter (Signed)
Pt states he was told to get compression stockings. He thought Claiborne Billings with [facility] was going to bring them to her. She called to let us know she was not bringing them to him. She is wondering if we can write an order for him to get them. She can be reached at [phone].

## 2018-12-13 ENCOUNTER — Ambulatory Visit (INDEPENDENT_AMBULATORY_CARE_PROVIDER_SITE_OTHER): Payer: Medicare Other | Admitting: *Deleted

## 2018-12-13 DIAGNOSIS — I639 Cerebral infarction, unspecified: Secondary | ICD-10-CM | POA: Diagnosis not present

## 2018-12-14 LAB — CUP PACEART REMOTE DEVICE CHECK
Date Time Interrogation Session: 20200229003945
Implantable Pulse Generator Implant Date: 20181030

## 2018-12-15 ENCOUNTER — Encounter: Payer: Self-pay | Admitting: Internal Medicine

## 2018-12-15 NOTE — Patient Instructions (Signed)
Coronary Artery Bypass Grafting, Care After This sheet gives you information about how to care for yourself after your procedure. Your doctor may also give you more specific instructions. If you have problems or questions, contact your doctor. Follow these instructions at home: Medicines  Take over-the-counter and prescription medicines only as told by your doctor. Do not stop taking medicines or start any new medicines unless your doctor says it is okay.  If you were prescribed an antibiotic medicine, take it as told by your doctor. Do not stop taking the antibiotic even if you start to feel better.  Do not drive or use heavy machinery while taking prescription pain medicine. Incision care      Follow instructions from your doctor about how to take care of your incisions. Make sure you: ? Wash your hands with soap and water before you change your bandage (dressing). If you cannot use soap and water, use hand sanitizer. ? Change your dressing as told by your doctor. ? Leave stitches (sutures), skin glue, or skin tape (adhesive) strips in place. They may need to stay in place for 2 weeks or longer. If tape strips get loose and curl up, you may trim the loose edges. Do not remove tape strips completely unless your doctor says it is okay.  Make sure the incisions are clean, dry, and protected.  Check your incision areas every day for signs of infection. Check for: ? Redness, swelling, or pain. ? Fluid or blood. ? Warmth. ? Pus or a bad smell.  If cuts were made in your legs: ? Avoid crossing your legs. ? Avoid sitting for long periods of time. Change positions every 30 minutes. ? Raise (elevate) your legs when you are sitting. Bathing  Do not take baths, swim, or use a hot tub until your doctor says it is okay.  Only take sponge baths. Pat the incisions dry. Do not rub the incisions to dry.  Ask your doctor when you can shower. Eating and drinking  Eat foods that are high in  fiber, such as raw fruits and vegetables, whole grains, beans, and nuts. Meats should be lean cut. Avoid canned, processed, and fried foods. This can help prevent constipation. This is also a recommended part of a heart-healthy diet.  Drink enough fluid to keep your urine clear or pale yellow.  Limit alcohol intake to no more than 1 drink a day for nonpregnant women and 2 drinks a day for men. One drink equals 12 oz of beer, 5 oz of wine, or 1 oz of hard liquor. Activity  Rest and limit your activity as told by your doctor. You may be told to: ? Stop any activity right away if you have chest pain, shortness of breath, irregular heartbeats, or dizziness. Get help right away if you have any of these symptoms. ? Move around often for short periods or take short walks as told by your doctor. Slowly increase your activities. You may need physical therapy or cardiac rehabilitation. ? Avoid lifting, pushing, or pulling anything that is heavier than 10 lb (4.5 kg) for at least 6 weeks or as told by your doctor.  Do not drive until your doctor says it is okay.  Ask your doctor when you can go back to work.  Ask your doctor when you can be sexually active. General instructions   Do not use any products that contain nicotine or tobacco, such as cigarettes and e-cigarettes. If you need help quitting, ask your doctor.  Take 2-3 deep breaths every few hours during the day while you get better. This helps expand your lungs and prevent complications.  If you were given a device called an incentive spirometer, use it several times a day to practice deep breathing. Support your chest with a pillow or your arms when you take deep breaths or cough.  Wear compression stockings as told by your doctor. These stockings help to prevent blood clots and reduce swelling in your legs.  Weigh yourself every day. This helps to see if your body is holding (retaining) fluid that may make your heart and lungs work  harder.  Keep all follow-up visits as told by your doctor. This is important. Contact a doctor if:  You have more redness, swelling, or pain around any cut.  You have more fluid or blood coming from any cut.  Any cut feels warm to the touch.  You have pus or a bad smell coming from any cut.  You have a fever.  You have swelling in your ankles or legs.  You have pain in your legs.  You gain 2 or more pounds (0.9 kg) a day.  You feel sick to your stomach (nauseous) or throw up (vomit).  You have watery poop (diarrhea). Get help right away if:  You have chest pain that goes to your jaw or arms.  You are short of breath.  You have a fast or irregular heartbeat.  You notice a "clicking" in your breastbone (sternum) when you move.  You feel numb or weak in your arms or legs.  You feel dizzy or light-headed. Summary  After the procedure, it is common to have pain or discomfort in the incision areas.  Do not take baths, swim, or use a hot tub until your health care provider approves.  Slowly increase your activities. You may need physical therapy or cardiac rehabilitation.  Weigh yourself every day. This helps to see if your body is holding (retaining) fluid that may make your heart and lungs work harder. This information is not intended to replace advice given to you by your health care provider. Make sure you discuss any questions you have with your health care provider. Document Released: 10/07/2013 Document Revised: 11/14/2016 Document Reviewed: 11/14/2016 Elsevier Interactive Patient Education  2019 [employer].

## 2018-12-15 NOTE — Progress Notes (Signed)
Subjective:    Patient ID: [employer], male    DOB: 12-30-1940, 78 y.o.   MRN: [id number]  HPI  Pt presents to the clinic today for hospital follow up for cardiac cath on 2/5. He was found to have multivessel CAD. He underwent CABG x 4, using a graft from the saphenous vein of his left leg. He developed Afib after surgery. He was discharged on 2/17. Since discharge, he reports fatigue, incisional pain. He denies chest pain or shortness of breath. He is having some bilateral lower extremity edema. His BP today is 142/70. He is currently managed on Amlodipine, Fenofibrate, Furosemide, Hydralazine, Metoprolol, Rosuvastatin, Potassium, ASA, Eliquis. His DM 2 is managed with Lantus. He has a follow up with cardiology next week.  Review of Systems      Past Medical History:  Diagnosis Date  . 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 Arthritis   . Cervical spondylosis   . Diabetes mellitus   . Hyperlipidemia   . Hypertension   . Stroke ([place]) 06/19/2017  . Vitamin D deficiency     Current Outpatient Medications  Medication Sig Dispense Refill  . amLODipine (NORVASC) 10 MG tablet Take 1 tablet (10 mg total) by mouth daily. 30 tablet 3  . apixaban (ELIQUIS) 5 MG TABS tablet Take 1 tablet (5 mg total) by mouth 2 (two) times daily. 60 tablet 6  . aspirin 81 MG tablet Take 1 tablet (81 mg total) by mouth daily.    . fenofibrate micronized (LOFIBRA) 67 MG capsule TAKE 1 CAPSULE DAILY BEFORE BREAKFAST 90 capsule 0  . furosemide (LASIX) 40 MG tablet Take 1 tablet (40 mg total) by mouth daily. 5 tablet 0  . hydrALAZINE (APRESOLINE) 25 MG tablet TAKE 1 TABLET BY MOUTH THREE TIMES A DAY 90 tablet 5  . Insulin Glargine (LANTUS SOLOSTAR) 100 UNIT/ML Solostar Pen Inject 100 Units into the skin every morning. 45 pen 11  . metoprolol tartrate (LOPRESSOR) 25 MG tablet Take 1 tablet (25 mg total) by mouth 2 (two) times daily. 60 tablet 3  . ONE TOUCH  ULTRA TEST test strip USE 1 STRIP TWICE A DAY 200 each 3  . ONETOUCH DELICA LANCETS 36O MISC     . potassium chloride (K-DUR,KLOR-CON) 10 MEQ tablet Take 2 tablets (20 mEq total) by mouth daily. 10 tablet 0  . rosuvastatin (CRESTOR) 40 MG tablet Take 1 tablet (40 mg total) by mouth at bedtime. 30 tablet 3  . traMADol (ULTRAM) 50 MG tablet Take 1 tablet (50 mg total) by mouth every 6 (six) hours as needed for moderate pain or severe pain. 28 tablet 0   No current facility-administered medications for this visit.     No Known Allergies  Family History  Problem Relation Age of Onset  . Cancer Father        "all over"  . Coronary artery disease Brother   . Diabetes Brother   . Stroke Mother   . Diabetes Mother   . Cancer Brother   . Colon cancer Neg Hx   . Esophageal cancer Neg Hx   . Rectal cancer Neg Hx   . Stomach cancer Neg Hx     Social History   Socioeconomic History  . Marital status: Divorced    Spouse name: Not on file  . Number of children: 2  . Years of education: Not on file  . Highest education level: Not on file  Occupational History  . Occupation: retired    Fish farm manager: [employer]  Social Needs  . Financial resource strain: Not on file  . Food insecurity:    Worry: Not on file    Inability: Not on file  . Transportation needs:    Medical: Not on file    Non-medical: Not on file  Tobacco Use  . Smoking status: Former Smoker    Types: Cigarettes    Last attempt to quit: 08/13/1994    Years since quitting: 24.3  . Smokeless tobacco: Never Used  Substance and Sexual Activity  . Alcohol use: No    Alcohol/week: 0.0 standard drinks  . Drug use: No  . Sexual activity: Not Currently  Lifestyle  . Physical activity:    Days per week: Not on file    Minutes per session: Not on file  . Stress: Not on file  Relationships  . Social connections:    Talks on phone: Not on file    Gets together: Not on file    Attends religious service: Not on file    Active member  of club or organization: Not on file    Attends meetings of clubs or organizations: Not on file    Relationship status: Not on file  . Intimate partner violence:    Fear of current or ex partner: Not on file    Emotionally abused: Not on file    Physically abused: Not on file    Forced sexual activity: Not on file  Other Topics Concern  . Not on file  Social History Narrative  . Not on file     Constitutional: Pt reports fatigue. Denies fever, malaise, headache or abrupt weight changes.  HEENT: Denies eye pain, eye redness, ear pain, ringing in the ears, wax buildup, runny nose, nasal congestion, bloody nose, or sore throat. Respiratory: Denies difficulty breathing, shortness of breath, cough or sputum production.   Cardiovascular: Pt reports swelling in legs. Denies chest pain, chest tightness, palpitations or swelling in the hands.  Gastrointestinal: Denies abdominal pain, bloating, constipation, diarrhea or blood in the stool.  GU: Denies urgency, frequency, pain with urination, burning sensation, blood in urine, odor or discharge. Skin: Pt reports incisional pain. Denies rashes, lesions or ulcercations.  Neurological: Denies dizziness, difficulty with memory, difficulty with speech or problems with balance and coordination.    No other specific complaints in a complete review of systems (except as listed in HPI above).  Objective:   Physical Exam  BP (!) 142/70 (BP Location: Left Arm, Patient Position: Sitting, Cuff Size: Small)   Pulse 65   Temp 98 F (36.7 C) (Oral)   Resp 16   Ht 5\' 7"  (1.702 m)   Wt 223 lb (101.2 kg)   SpO2 98%   BMI 34.93 kg/m  Wt Readings from Last 3 Encounters:  12/12/18 223 lb (101.2 kg)  12/09/18 218 lb (98.9 kg)  12/02/18 209 lb 14.1 oz (95.2 kg)    General: Appears his stated age, obese, in NAD. Skin: Warm, dry and intact. Vertical incision noted overlying the sternum. 3 puncture wounds noted of upper abdomen. All with signs of normal  healing, no redness, warmth or drainage noted. Cardiovascular: Normal rate and rhythm. S1,S2 noted.  No murmur, rubs or gallops noted. No JVD or BLE edema. No carotid bruits noted. Pulmonary/Chest: Normal effort and positive vesicular breath sounds. No respiratory distress. No wheezes, rales or ronchi noted.  Abdomen: Soft and nontender. Normal bowel sounds. No distention or masses  noted.  Musculoskeletal: No difficulty with gait.  Neurological: Alert and oriented.  BMET    Component Value Date/Time   NA 142 12/02/2018 0321   NA 147 (H) 11/14/2018 1211   K 3.9 12/02/2018 0321   CL 107 12/02/2018 0321   CO2 24 12/02/2018 0321   GLUCOSE 82 12/02/2018 0321   BUN 29 (H) 12/02/2018 0321   BUN 26 11/14/2018 1211   CREATININE 1.50 (H) 12/02/2018 0321   CREATININE 1.14 11/07/2016 1514   CALCIUM 8.4 (L) 12/02/2018 0321   GFRNONAA 45 (L) 12/02/2018 0321   GFRAA 52 (L) 12/02/2018 0321    Lipid Panel     Component Value Date/Time   CHOL 138 11/12/2018 1528   TRIG 214.0 (H) 11/12/2018 1528   HDL 25.60 (L) 11/12/2018 1528   CHOLHDL 5 11/12/2018 1528   VLDL 42.8 (H) 11/12/2018 1528   LDLCALC 56 06/22/2017 0516    CBC    Component Value Date/Time   WBC 11.2 (H) 11/29/2018 0328   RBC 3.89 (L) 11/29/2018 0328   HGB 10.5 (L) 11/29/2018 0328   HGB 13.9 11/14/2018 1211   HCT 34.0 (L) 11/29/2018 0328   HCT 41.0 11/14/2018 1211   PLT 177 11/29/2018 0328   PLT 261 11/14/2018 1211   MCV 87.4 11/29/2018 0328   MCV 82 11/14/2018 1211   MCH 27.0 11/29/2018 0328   MCHC 30.9 11/29/2018 0328   RDW 14.6 11/29/2018 0328   RDW 13.7 11/14/2018 1211   LYMPHSABS 1.8 03/25/2018 1422   MONOABS 0.8 03/25/2018 1422   EOSABS 0.2 03/25/2018 1422   BASOSABS 0.1 03/25/2018 1422    Hgb A1C Lab Results  Component Value Date   HGBA1C 7.1 (H) 11/26/2018            Assessment & Plan:   Hospital Follow Up for 4V CABG, Afib:  Hospital notes, labs and imaging reviewed Pt doing relatively well,  continue Home Health Continue current medications as is Follow up with cardiology as previously scheduled  Return precautions discussed Webb Silversmith, NP

## 2018-12-16 DIAGNOSIS — I2510 Atherosclerotic heart disease of native coronary artery without angina pectoris: Secondary | ICD-10-CM | POA: Diagnosis not present

## 2018-12-16 DIAGNOSIS — I1310 Hypertensive heart and chronic kidney disease without heart failure, with stage 1 through stage 4 chronic kidney disease, or unspecified chronic kidney disease: Secondary | ICD-10-CM | POA: Diagnosis not present

## 2018-12-16 DIAGNOSIS — E1122 Type 2 diabetes mellitus with diabetic chronic kidney disease: Secondary | ICD-10-CM | POA: Diagnosis not present

## 2018-12-16 DIAGNOSIS — Z48812 Encounter for surgical aftercare following surgery on the circulatory system: Secondary | ICD-10-CM | POA: Diagnosis not present

## 2018-12-16 DIAGNOSIS — N189 Chronic kidney disease, unspecified: Secondary | ICD-10-CM | POA: Diagnosis not present

## 2018-12-16 DIAGNOSIS — M159 Polyosteoarthritis, unspecified: Secondary | ICD-10-CM | POA: Diagnosis not present

## 2018-12-17 ENCOUNTER — Encounter: Payer: Self-pay | Admitting: Physician Assistant

## 2018-12-17 ENCOUNTER — Ambulatory Visit (INDEPENDENT_AMBULATORY_CARE_PROVIDER_SITE_OTHER): Payer: Medicare Other | Admitting: Physician Assistant

## 2018-12-17 VITALS — BP 140/72 | HR 64 | Ht 67.0 in | Wt 220.4 lb

## 2018-12-17 DIAGNOSIS — I5033 Acute on chronic diastolic (congestive) heart failure: Secondary | ICD-10-CM | POA: Diagnosis not present

## 2018-12-17 DIAGNOSIS — I6389 Other cerebral infarction: Secondary | ICD-10-CM

## 2018-12-17 DIAGNOSIS — I480 Paroxysmal atrial fibrillation: Secondary | ICD-10-CM

## 2018-12-17 DIAGNOSIS — Z951 Presence of aortocoronary bypass graft: Secondary | ICD-10-CM

## 2018-12-17 DIAGNOSIS — I2510 Atherosclerotic heart disease of native coronary artery without angina pectoris: Secondary | ICD-10-CM

## 2018-12-17 DIAGNOSIS — R600 Localized edema: Secondary | ICD-10-CM | POA: Diagnosis not present

## 2018-12-17 DIAGNOSIS — I5032 Chronic diastolic (congestive) heart failure: Secondary | ICD-10-CM

## 2018-12-17 DIAGNOSIS — I5030 Unspecified diastolic (congestive) heart failure: Secondary | ICD-10-CM | POA: Insufficient documentation

## 2018-12-17 DIAGNOSIS — I10 Essential (primary) hypertension: Secondary | ICD-10-CM

## 2018-12-17 DIAGNOSIS — N189 Chronic kidney disease, unspecified: Secondary | ICD-10-CM

## 2018-12-17 HISTORY — DX: Chronic diastolic (congestive) heart failure: I50.32

## 2018-12-17 HISTORY — DX: Paroxysmal atrial fibrillation: I48.0

## 2018-12-17 HISTORY — DX: Chronic kidney disease, unspecified: N18.9

## 2018-12-17 MED ORDER — POTASSIUM CHLORIDE CRYS ER 10 MEQ PO TBCR: EXTENDED_RELEASE_TABLET | ORAL | 3 refills | Status: DC

## 2018-12-17 MED ORDER — AMLODIPINE BESYLATE 5 MG PO TABS: 5.0000 mg | ORAL_TABLET | Freq: Every day | ORAL | 3 refills | Status: DC

## 2018-12-17 MED ORDER — HYDRALAZINE HCL 50 MG PO TABS: 50.0000 mg | ORAL_TABLET | Freq: Three times a day (TID) | ORAL | 3 refills | Status: DC

## 2018-12-17 MED ORDER — FUROSEMIDE 40 MG PO TABS: ORAL_TABLET | ORAL | 3 refills | Status: DC

## 2018-12-17 NOTE — Patient Instructions (Addendum)
Medication Instructions:  Your physician has recommended you make the following change in your medication:   1. DECREASE: amlodipine (norvasc) to 5 mg once a day  2. INCREASE: hydralazine to 50 mg three times a day  3. For the next 3 days: Take furosemide (lasix) 80 mg daily and potassium (k-dur) 40 mEq daily, Then go back to taking lasix 40 mg daily and potassium 20 mEq daily  Lab work: None Ordered  If you have labs (blood work) drawn today and your tests are completely normal, you will receive your results only by: Marland Kitchen MyChart Message (if you have MyChart) OR . A paper copy in the mail If you have any lab test that is abnormal or we need to change your treatment, we will call you to review the results.  Testing/Procedures: None ordered  Follow-Up: . Follow up with Ermalinda Barrios, PA on 01/01/19 at 12:00 PM  Any Other Special Instructions Will Be Listed Below (If Applicable).

## 2018-12-17 NOTE — Addendum Note (Signed)
Addended by: Drue Novel I on: 12/17/2018 12:18 PM   Modules accepted: Orders

## 2018-12-18 NOTE — Progress Notes (Signed)
Carelink Summary Report / Loop Recorder 

## 2018-12-19 ENCOUNTER — Telehealth: Payer: Self-pay | Admitting: Physician Assistant

## 2018-12-19 ENCOUNTER — Ambulatory Visit: Payer: Medicare Other | Admitting: *Deleted

## 2018-12-19 ENCOUNTER — Encounter: Payer: Self-pay | Admitting: *Deleted

## 2018-12-19 DIAGNOSIS — N189 Chronic kidney disease, unspecified: Secondary | ICD-10-CM | POA: Diagnosis not present

## 2018-12-19 DIAGNOSIS — M159 Polyosteoarthritis, unspecified: Secondary | ICD-10-CM | POA: Diagnosis not present

## 2018-12-19 DIAGNOSIS — E1122 Type 2 diabetes mellitus with diabetic chronic kidney disease: Secondary | ICD-10-CM | POA: Diagnosis not present

## 2018-12-19 DIAGNOSIS — I1310 Hypertensive heart and chronic kidney disease without heart failure, with stage 1 through stage 4 chronic kidney disease, or unspecified chronic kidney disease: Secondary | ICD-10-CM | POA: Diagnosis not present

## 2018-12-19 DIAGNOSIS — Z48812 Encounter for surgical aftercare following surgery on the circulatory system: Secondary | ICD-10-CM | POA: Diagnosis not present

## 2018-12-19 DIAGNOSIS — I2510 Atherosclerotic heart disease of native coronary artery without angina pectoris: Secondary | ICD-10-CM | POA: Diagnosis not present

## 2018-12-19 NOTE — Telephone Encounter (Signed)
°*  STAT* If patient is at the pharmacy, call can be transferred to refill team.   1. Which medications need to be refilled? (please list name of each medication and dose if known) potasium 20 mg   2. Which pharmacy/location (including street and city if local pharmacy) is medication to be sent to?[facility] [address]  3. Do they need a 30 day or 90 day supply? Not sure what hte Dr. Montine Circle medication need to be resent.

## 2018-12-19 NOTE — Telephone Encounter (Signed)
This encounter was created in error - please disregard.

## 2018-12-19 NOTE — Telephone Encounter (Signed)
Called pt and his pharmacy and informed pt that his pharmacy stated that his medications were both pick up at the same time. I explained to the pt that he also has refills at his pharmacy. I advised the pt that if he has any other problems, questions or concerns to call the office. Pt verbalized understanding.

## 2018-12-23 DIAGNOSIS — I2510 Atherosclerotic heart disease of native coronary artery without angina pectoris: Secondary | ICD-10-CM | POA: Diagnosis not present

## 2018-12-23 DIAGNOSIS — Z9489 Other transplanted organ and tissue status: Secondary | ICD-10-CM

## 2018-12-23 DIAGNOSIS — Z7982 Long term (current) use of aspirin: Secondary | ICD-10-CM | POA: Diagnosis not present

## 2018-12-23 DIAGNOSIS — M159 Polyosteoarthritis, unspecified: Secondary | ICD-10-CM | POA: Diagnosis not present

## 2018-12-23 DIAGNOSIS — N189 Chronic kidney disease, unspecified: Secondary | ICD-10-CM | POA: Diagnosis not present

## 2018-12-23 DIAGNOSIS — I1310 Hypertensive heart and chronic kidney disease without heart failure, with stage 1 through stage 4 chronic kidney disease, or unspecified chronic kidney disease: Secondary | ICD-10-CM | POA: Diagnosis not present

## 2018-12-23 DIAGNOSIS — Z9181 History of falling: Secondary | ICD-10-CM

## 2018-12-23 DIAGNOSIS — Z794 Long term (current) use of insulin: Secondary | ICD-10-CM

## 2018-12-23 DIAGNOSIS — Z7902 Long term (current) use of antithrombotics/antiplatelets: Secondary | ICD-10-CM | POA: Diagnosis not present

## 2018-12-23 DIAGNOSIS — Z8673 Personal history of transient ischemic attack (TIA), and cerebral infarction without residual deficits: Secondary | ICD-10-CM

## 2018-12-23 DIAGNOSIS — E785 Hyperlipidemia, unspecified: Secondary | ICD-10-CM | POA: Diagnosis not present

## 2018-12-23 DIAGNOSIS — Z602 Problems related to living alone: Secondary | ICD-10-CM

## 2018-12-23 DIAGNOSIS — Z951 Presence of aortocoronary bypass graft: Secondary | ICD-10-CM | POA: Diagnosis not present

## 2018-12-23 DIAGNOSIS — Z48812 Encounter for surgical aftercare following surgery on the circulatory system: Secondary | ICD-10-CM | POA: Diagnosis not present

## 2018-12-23 DIAGNOSIS — E1122 Type 2 diabetes mellitus with diabetic chronic kidney disease: Secondary | ICD-10-CM | POA: Diagnosis not present

## 2018-12-23 NOTE — Addendum Note (Signed)
Addended by: Jearld Fenton on: 12/23/2018 08:50 AM   Modules accepted: Orders

## 2018-12-23 NOTE — Telephone Encounter (Signed)
RX done, placed up front.

## 2018-12-26 DIAGNOSIS — I2510 Atherosclerotic heart disease of native coronary artery without angina pectoris: Secondary | ICD-10-CM | POA: Diagnosis not present

## 2018-12-26 DIAGNOSIS — M159 Polyosteoarthritis, unspecified: Secondary | ICD-10-CM | POA: Diagnosis not present

## 2018-12-26 DIAGNOSIS — N189 Chronic kidney disease, unspecified: Secondary | ICD-10-CM | POA: Diagnosis not present

## 2018-12-26 DIAGNOSIS — E1122 Type 2 diabetes mellitus with diabetic chronic kidney disease: Secondary | ICD-10-CM | POA: Diagnosis not present

## 2018-12-26 DIAGNOSIS — I1310 Hypertensive heart and chronic kidney disease without heart failure, with stage 1 through stage 4 chronic kidney disease, or unspecified chronic kidney disease: Secondary | ICD-10-CM | POA: Diagnosis not present

## 2018-12-26 DIAGNOSIS — Z48812 Encounter for surgical aftercare following surgery on the circulatory system: Secondary | ICD-10-CM | POA: Diagnosis not present

## 2018-12-30 ENCOUNTER — Encounter: Payer: Self-pay | Admitting: Physician Assistant

## 2018-12-30 ENCOUNTER — Telehealth: Payer: Self-pay | Admitting: Physician Assistant

## 2018-12-30 NOTE — Telephone Encounter (Signed)
Left message for patient to call back  

## 2018-12-30 NOTE — Telephone Encounter (Signed)
New message   Pts brother is calling because he is the pts transportation and he is wondering if they should still come to the appt Wednesday due to the virus. Please call back

## 2018-12-31 ENCOUNTER — Other Ambulatory Visit: Payer: Self-pay

## 2018-12-31 ENCOUNTER — Telehealth: Payer: Self-pay | Admitting: Physician Assistant

## 2018-12-31 ENCOUNTER — Other Ambulatory Visit: Payer: Self-pay | Admitting: Surgery

## 2018-12-31 DIAGNOSIS — Z951 Presence of aortocoronary bypass graft: Secondary | ICD-10-CM

## 2018-12-31 NOTE — Telephone Encounter (Signed)
I spoke with patient concerning his appointment tomorrow in light of the coronavirus.  Patient says swelling is down and he feels so much better.  He says he is back to normal.  He does not feel he needs to come in for an appointment tomorrow he has an appointment with the surgeons for chest x-ray and follow-up tomorrow as well.  We have arranged for home health nurse to draw a be met to follow-up on his renal function since his medication changes.  With have him follow-up with myself or Dr. Angelena Form in 1 month.

## 2018-12-31 NOTE — Telephone Encounter (Signed)
See other phone message  

## 2019-01-01 ENCOUNTER — Other Ambulatory Visit (HOSPITAL_COMMUNITY): Payer: Self-pay | Admitting: Physician Assistant

## 2019-01-01 ENCOUNTER — Ambulatory Visit: Payer: Medicare Other | Admitting: Physician Assistant

## 2019-01-01 ENCOUNTER — Ambulatory Visit (INDEPENDENT_AMBULATORY_CARE_PROVIDER_SITE_OTHER): Payer: Self-pay | Admitting: Surgery

## 2019-01-01 ENCOUNTER — Other Ambulatory Visit (HOSPITAL_COMMUNITY): Payer: Medicare Other | Admitting: Physician Assistant

## 2019-01-01 ENCOUNTER — Encounter: Payer: Self-pay | Admitting: Surgery

## 2019-01-01 ENCOUNTER — Ambulatory Visit
Admission: RE | Admit: 2019-01-01 | Discharge: 2019-01-01 | Disposition: A | Discharge status: Home / Self Care | Payer: Medicare Other | Source: Ambulatory Visit | Attending: Surgery | Admitting: Surgery

## 2019-01-01 VITALS — BP 159/73 | HR 66 | Temp 97.4°F | Resp 16 | Ht 67.0 in | Wt 223.6 lb

## 2019-01-01 DIAGNOSIS — M159 Polyosteoarthritis, unspecified: Secondary | ICD-10-CM | POA: Diagnosis not present

## 2019-01-01 DIAGNOSIS — I2510 Atherosclerotic heart disease of native coronary artery without angina pectoris: Secondary | ICD-10-CM | POA: Diagnosis not present

## 2019-01-01 DIAGNOSIS — E1122 Type 2 diabetes mellitus with diabetic chronic kidney disease: Secondary | ICD-10-CM | POA: Diagnosis not present

## 2019-01-01 DIAGNOSIS — Z951 Presence of aortocoronary bypass graft: Secondary | ICD-10-CM | POA: Diagnosis not present

## 2019-01-01 DIAGNOSIS — I130 Hypertensive heart and chronic kidney disease with heart failure and stage 1 through stage 4 chronic kidney disease, or unspecified chronic kidney disease: Secondary | ICD-10-CM | POA: Diagnosis not present

## 2019-01-01 DIAGNOSIS — Z48812 Encounter for surgical aftercare following surgery on the circulatory system: Secondary | ICD-10-CM | POA: Diagnosis not present

## 2019-01-01 DIAGNOSIS — N189 Chronic kidney disease, unspecified: Secondary | ICD-10-CM | POA: Diagnosis not present

## 2019-01-01 DIAGNOSIS — I1310 Hypertensive heart and chronic kidney disease without heart failure, with stage 1 through stage 4 chronic kidney disease, or unspecified chronic kidney disease: Secondary | ICD-10-CM | POA: Diagnosis not present

## 2019-01-01 NOTE — Telephone Encounter (Signed)
Pt pharmacy is requesting refill for ELIQUIS please address thank you.

## 2019-01-01 NOTE — Telephone Encounter (Signed)
Followed by afib clinic.

## 2019-01-01 NOTE — Progress Notes (Signed)
HPI: Patient returns for routine postoperative follow-up having undergone coronary artery bypass graft surgery x4 on 11/26/2018. The patient's early postoperative recovery while in the hospital was notable for an uncomplicated postoperative course. Since hospital discharge the patient reports that he developed some lower extremity edema and shortness of breath with ambulation.  He was seen by cardiology and treated with a short course of increased Lasix at 80 mg daily for 3 days which resolved his lower extremity edema and shortness of breath.  He is continued to walk and has no shortness of breath or chest discomfort.  He has minimal incisional pain mostly with coughing.   Current Outpatient Medications  Medication Sig Dispense Refill  . amLODipine (NORVASC) 5 MG tablet Take 1 tablet (5 mg total) by mouth daily. 90 tablet 3  . aspirin 81 MG tablet Take 1 tablet (81 mg total) by mouth daily.    Marland Kitchen ELIQUIS 5 MG TABS tablet TAKE 1 TABLET BY MOUTH TWICE A DAY 60 tablet 6  . fenofibrate micronized (LOFIBRA) 67 MG capsule TAKE 1 CAPSULE DAILY BEFORE BREAKFAST 90 capsule 0  . furosemide (LASIX) 40 MG tablet Take 2 tablets once a day for 3 days only and then take 1 tablet once a day 94 tablet 3  . hydrALAZINE (APRESOLINE) 50 MG tablet Take 1 tablet (50 mg total) by mouth 3 (three) times daily. 270 tablet 3  . Insulin Glargine (LANTUS SOLOSTAR) 100 UNIT/ML Solostar Pen Inject 100 Units into the skin every morning. 45 pen 11  . metoprolol tartrate (LOPRESSOR) 25 MG tablet Take 1 tablet (25 mg total) by mouth 2 (two) times daily. 60 tablet 3  . ONE TOUCH ULTRA TEST test strip USE 1 STRIP TWICE A DAY 200 each 3  . ONETOUCH DELICA LANCETS 53M MISC     . potassium chloride (K-DUR,KLOR-CON) 10 MEQ tablet Take 4 tablets daily for 3 days only and then take 2 tablets daily 186 tablet 3  . rosuvastatin (CRESTOR) 40 MG tablet Take 1 tablet (40 mg total) by mouth at bedtime. 30 tablet 3  . traMADol (ULTRAM) 50  MG tablet Take 1 tablet (50 mg total) by mouth every 6 (six) hours as needed for moderate pain or severe pain. 28 tablet 0   No current facility-administered medications for this visit.     Physical Exam: BP (!) 159/73 (BP Location: Right Arm, Patient Position: Sitting, Cuff Size: Large)   Pulse 66   Temp (!) 97.4 F (36.3 C) (Oral)   Resp 16   Ht 5\' 7"  (1.702 m)   Wt 223 lb 9.6 oz (101.4 kg)   SpO2 96% Comment: ON RA  BMI 35.02 kg/m  He looks well. Cardiac exam shows regular rate and rhythm with normal heart sounds. Lung exam is clear. Chest incision is healing well and sternum is stable. He is left leg incision is healing well and there is no lower extremity edema.  Diagnostic Tests:  CLINICAL DATA:  78 year old with post CABG.  EXAM: CHEST - 2 VIEW  COMPARISON:  11/29/2018  FINDINGS: Heart size is stable with post CABG changes. Cardiac loop recorder is present. The lungs are clear without airspace disease or pulmonary edema. Negative for a pneumothorax. Pleural fluid has resolved from the prior chest radiograph.  IMPRESSION: No active cardiopulmonary disease.   Electronically Signed   By: Markus Daft M.D.   On: 01/01/2019 13:17  Impression:  Overall I think he is making good progress following coronary bypass graft  surgery.  His blood pressure is still little high but his heart rate is 66 and I would not increase his Lopressor at this time.  He was previously on Norvasc 10 mg/day but was decreased to 5 mg/day when he presented with increased edema.  I think his medications can be adjusted when he follows up with cardiology.  He had an appointment today but that is going to be rescheduled due to the coronavirus pandemic.  He has been maintaining a good diet and is restricting sodium.  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  Plan:  He is going to follow-up with  cardiology and will contact me if he develops any problems with his incisions.  Gaye Pollack, MD [facility] [phone]

## 2019-01-03 NOTE — Telephone Encounter (Signed)
Unable to leave VM  Tedra [place]. Support Rep II

## 2019-01-08 ENCOUNTER — Other Ambulatory Visit: Payer: Self-pay | Admitting: Internal Medicine

## 2019-01-12 ENCOUNTER — Other Ambulatory Visit: Payer: Self-pay | Admitting: Internal Medicine

## 2019-01-13 NOTE — Telephone Encounter (Signed)
Appears it was. Refill declined.

## 2019-01-13 NOTE — Telephone Encounter (Signed)
It looks like this medication was d/c... please advise

## 2019-01-14 ENCOUNTER — Other Ambulatory Visit (HOSPITAL_COMMUNITY): Payer: Self-pay | Admitting: *Deleted

## 2019-01-14 ENCOUNTER — Telehealth (HOSPITAL_COMMUNITY): Payer: Self-pay | Admitting: *Deleted

## 2019-01-14 MED ORDER — APIXABAN 5 MG PO TABS: 5.0000 mg | ORAL_TABLET | Freq: Two times a day (BID) | ORAL | 6 refills | Status: DC

## 2019-01-14 NOTE — Telephone Encounter (Signed)
Patient approved for eliquis coverage 12/14/18-01/13/20 case ID #[id number]

## 2019-01-15 ENCOUNTER — Ambulatory Visit (INDEPENDENT_AMBULATORY_CARE_PROVIDER_SITE_OTHER): Payer: Medicare Other | Admitting: *Deleted

## 2019-01-15 ENCOUNTER — Other Ambulatory Visit: Payer: Self-pay

## 2019-01-15 DIAGNOSIS — I6389 Other cerebral infarction: Secondary | ICD-10-CM | POA: Diagnosis not present

## 2019-01-16 LAB — CUP PACEART REMOTE DEVICE CHECK
Date Time Interrogation Session: 20200402013631
Implantable Pulse Generator Implant Date: 20181030

## 2019-01-20 NOTE — Progress Notes (Signed)
Carelink Summary Report / Loop Recorder 

## 2019-01-22 ENCOUNTER — Other Ambulatory Visit: Payer: Self-pay | Admitting: Physician Assistant

## 2019-01-27 NOTE — Telephone Encounter (Signed)
Virtual Visit Pre-Appointment Phone Call   TELEPHONE CALL NOTE  Latty has been deemed a candidate for a follow-up tele-health visit to limit community exposure during the Covid-19 pandemic. I spoke with the patient via phone to ensure availability of phone/video source, confirm preferred email & phone number, and discuss instructions and expectations.  I reminded [place] to be prepared with any vital sign and/or heart rhythm information that could potentially be obtained via home monitoring, at the time of his visit. I reminded Mitcheal S Malachi to expect a phone call at the time of his visit if his visit.  Did the patient verbally acknowledge consent to treatment? YES  Patient agrees to VIDEO Visit via [place] with Ermalinda Barrios, PA on 4/14.  Cleon Gustin, RN 01/27/2019 9:01 AM   DOWNLOADING THE [place] TO SMARTPHONE  - If Apple, go to [employer] and type in WebEx in the search bar. [name] [employer], the blue/green circle. The app is free but as with any other app downloads, their phone may require them to verify saved payment information or Apple password. The patient does NOT have to create an account.  - If Android, ask patient to go to [employer] and type in WebEx in the search bar. [place] [employer], the blue/green circle. The app is free but as with any other app downloads, their phone may require them to verify saved payment information or Android password. The patient does NOT have to create an account.   CONSENT FOR TELE-HEALTH VISIT - PLEASE REVIEW  I hereby voluntarily request, consent and authorize [facility] and its employed or contracted physicians, physician assistants, nurse practitioners or other licensed health care professionals (the Practitioner), to provide me with telemedicine health care services (the "Services") as deemed necessary by the treating Practitioner. I acknowledge and consent to  receive the Services by the Practitioner via telemedicine. I understand that the telemedicine visit will involve communicating with the Practitioner through live audiovisual communication technology and the disclosure of certain medical information by electronic transmission. I acknowledge that I have been given the opportunity to request an in-person assessment or other available alternative prior to the telemedicine visit and am voluntarily participating in the telemedicine visit.  I understand that I have the right to withhold or withdraw my consent to the use of telemedicine in the course of my care at any time, without affecting my right to future care or treatment, and that the Practitioner or I may terminate the telemedicine visit at any time. I understand that I have the right to inspect all information obtained and/or recorded in the course of the telemedicine visit and may receive copies of available information for a reasonable fee.  I understand that some of the potential risks of receiving the Services via telemedicine include:  Marland Kitchen Delay or interruption in medical evaluation due to technological equipment failure or disruption; . Information transmitted may not be sufficient (e.g. poor resolution of images) to allow for appropriate medical decision making by the Practitioner; and/or  . In rare instances, security protocols could fail, causing a breach of personal health information.  Furthermore, I acknowledge that it is my responsibility to provide information about my medical history, conditions and care that is complete and accurate to the best of my ability. I acknowledge that Practitioner's advice, recommendations, and/or decision may be based on factors not within their control, such as incomplete or inaccurate data provided by me or  distortions of diagnostic images or specimens that may result from electronic transmissions. I understand that the practice of medicine is not an exact science  and that Practitioner makes no warranties or guarantees regarding treatment outcomes. I acknowledge that I will receive a copy of this consent concurrently upon execution via email to the email address I last provided but may also request a printed copy by calling the office of [place].    I understand that my insurance will be billed for this visit.   I have read or had this consent read to me. . I understand the contents of this consent, which adequately explains the benefits and risks of the Services being provided via telemedicine.  . I have been provided ample opportunity to ask questions regarding this consent and the Services and have had my questions answered to my satisfaction. . I give my informed consent for the services to be provided through the use of telemedicine in my medical care  By participating in this telemedicine visit I agree to the above.

## 2019-01-27 NOTE — Progress Notes (Signed)
Virtual Visit via Video Note   This visit type was conducted due to national recommendations for restrictions regarding the COVID-19 Pandemic (e.g. social distancing) in an effort to limit this patient's exposure and mitigate transmission in our community.  Due to his co-morbid illnesses, this patient is at least at moderate risk for complications without adequate follow up.  This format is felt to be most appropriate for this patient at this time.  All issues noted in this document were discussed and addressed.  A limited physical exam was performed with this format.  Please refer to the patient's chart for his consent to telehealth for [facility].   Evaluation Performed:  Follow-up visit  Date:  01/28/2019   ID:  Jake Gross, DOB 1941/06/08, MRN [id number]  Patient Location: Home  Provider Location: Home  PCP:  Jearld Fenton, NP  Cardiologist:  Lauree Chandler, MD  Electrophysiologist:  Thompson Grayer, MD   Chief Complaint:  F/U chf  History of Present Illness:    Jake Gross is a 78 y.o. male who presents via Engineer, civil (consulting) for a telehealth visit today.    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  CHA2DS2-VASc equals 7. patient was seen in the A. fib clinic 12/09/2018 and Plavix was stopped and Eliquis 5 mg twice daily started.  Aspirin was continued at 81 mg.  2D echo 11/21/2018 normal LVEF 60 to 65% mild dilation of the aortic root and ascending aorta.   I saw the patient 12/17/18 complaining of shortness of breath with walking 50 yards and swollen legs which was caused by amlodipine in the past. I decreased amlodipine to 5 mg daily and increased hydralazine to 50 mg TID, Increased lasix 80 mg for 3 days then back to 40 mg daily,compression stockings. No ACEI/ARB because of CKD. Weight 220 lbs that day.   LINQ  repport 01/16/19 no arryhthmias.normal histogram.  Patient's weight is down to 209 lbs today and swelling down. Can walk much better. Never got compression stockings. No chest pain, dyspnea, palpitations, dizziness or presyncope or edema.  The patient does not have symptoms concerning for COVID-19 infection (fever, chills, cough, or new shortness of breath).    Past Medical History:  Diagnosis Date  . 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 Arthritis   . Cervical spondylosis   . Diabetes mellitus   . Hyperlipidemia   . Hypertension   . Stroke ([place]) 06/19/2017  . Vitamin D deficiency    Past Surgical History:  Procedure Laterality Date  . ABCESS DRAINAGE Left    buttocks  . CARDIOVASCULAR STRESS TEST  10/12/1999   EF 63%. NO ISCHEMIA  . COLONOSCOPY W/ POLYPECTOMY     5 polyps  . CORONARY ARTERY BYPASS GRAFT N/A 11/26/2018   Procedure: CORONARY ARTERY BYPASS GRAFTING (CABG), ON PUMP, TIMES FOUR, USING LEFT INTERNAL MAMMARY ARTERY AND ENDOSCOPICALLY HARVESTED LEFT SAPHENOUS VEIN;  Surgeon: Gaye Pollack, MD;  Location: [facility];  Service: Open Heart Surgery;  Laterality: N/A;  . EUS N/A 03/11/2015   Procedure: LOWER ENDOSCOPIC ULTRASOUND (EUS);  Surgeon: Milus Banister, MD;  Location: [facility];  Service: Endoscopy;  Laterality: N/A;  . FLEXIBLE SIGMOIDOSCOPY N/A 02/02/2015   Procedure: FLEXIBLE SIGMOIDOSCOPY;  Surgeon: Inda Castle, MD;  Location: [facility];  Service: Endoscopy;  Laterality: N/A;  ERBE  . LEFT HEART CATH AND CORONARY ANGIOGRAPHY N/A 11/20/2018   Procedure: LEFT HEART CATH AND CORONARY ANGIOGRAPHY;  Surgeon: Burnell Blanks, MD;  Location: [facility];  Service: Cardiovascular;  Laterality: N/A;  . LOOP RECORDER INSERTION N/A 08/14/2017   Procedure: LOOP RECORDER INSERTION;  Surgeon: Thompson Grayer, MD;  Location: [facility];  Service: Cardiovascular;  Laterality: N/A;  . PARTIAL PROCTECTOMY  BY TEM N/A 04/08/2015   Procedure: TEM PARTIAL PROCTECTOMY OF RECTAL MASS;  Surgeon: Michael Boston, MD;  Location: [facility];  Service: General;  Laterality: N/A;  . TEE WITHOUT CARDIOVERSION N/A 06/25/2017   Procedure: TRANSESOPHAGEAL ECHOCARDIOGRAM (TEE);  Surgeon: Skeet Latch, MD;  Location: [facility];  Service: Cardiovascular;  Laterality: N/A;  . TEE WITHOUT CARDIOVERSION N/A 11/26/2018   Procedure: TRANSESOPHAGEAL ECHOCARDIOGRAM (TEE);  Surgeon: Gaye Pollack, MD;  Location: [facility];  Service: Open Heart Surgery;  Laterality: N/A;  . TONSILLECTOMY AND ADENOIDECTOMY     as child     Current Meds  Medication Sig  . amLODipine (NORVASC) 5 MG tablet Take 1 tablet (5 mg total) by mouth daily.  Marland Kitchen apixaban (ELIQUIS) 5 MG TABS tablet Take 1 tablet (5 mg total) by mouth 2 (two) times daily.  Marland Kitchen aspirin 81 MG tablet Take 1 tablet (81 mg total) by mouth daily.  . fenofibrate micronized (LOFIBRA) 67 MG capsule TAKE 1 CAPSULE DAILY BEFORE BREAKFAST  . furosemide (LASIX) 40 MG tablet Take 2 tablets once a day for 3 days only and then take 1 tablet once a day  . hydrALAZINE (APRESOLINE) 50 MG tablet Take 1 tablet (50 mg total) by mouth 3 (three) times daily.  . Insulin Glargine (LANTUS SOLOSTAR) 100 UNIT/ML Solostar Pen Inject 100 Units into the skin every morning.  . metoprolol tartrate (LOPRESSOR) 25 MG tablet Take 1 tablet (25 mg total) by mouth 2 (two) times daily.  . ONE TOUCH ULTRA TEST test strip USE 1 STRIP TWICE A DAY  . ONETOUCH DELICA LANCETS 89V MISC   . potassium chloride (K-DUR,KLOR-CON) 10 MEQ tablet Take 4 tablets daily for 3 days only and then take 2 tablets daily  . rosuvastatin (CRESTOR) 40 MG tablet Take 1 tablet (40 mg total) by mouth at bedtime.  . traMADol (ULTRAM) 50 MG tablet Take 1 tablet (50 mg total) by mouth every 6 (six) hours as needed for moderate pain or severe pain.     Allergies:   Patient has no known allergies.   Social History   Tobacco Use  . Smoking  status: Former Smoker    Types: Cigarettes    Last attempt to quit: 08/13/1994    Years since quitting: 24.4  . Smokeless tobacco: Never Used  Substance Use Topics  . Alcohol use: No    Alcohol/week: 0.0 standard drinks  . Drug use: No     Family Hx: The patient's family history includes Cancer in his brother and father; Coronary artery disease in his brother; Diabetes in his brother and mother; Stroke in his mother. There is no history of Colon cancer, Esophageal cancer, Rectal cancer, or Stomach cancer.  ROS:   Please see the history of present illness.    Chest soreness from CABG All other systems reviewed and are negative.   Prior CV studies:   The following studies were reviewed today:  Cath: 11/20/2018    Prox RCA to Mid RCA lesion is 30% stenosed.  RPDA lesion is 99% stenosed.  Post  Atrio lesion is 100% stenosed.  Ost 2nd Diag to 2nd Diag lesion is 99% stenosed.  Prox LAD lesion is 95% stenosed.  Ost 1st Diag lesion is 80% stenosed.  Ost Cx to Prox Cx lesion is 70% stenosed.  Ost LM lesion is 70% stenosed.   1. Moderately severe ostial left main stenosis.  2. Severe stenosis in the heavily calcified mid LAD 3. Moderately severe proximal Circumflex stenosis 4. The RCA has mild calcific disease through the proximal and mid segments. The PDA is a moderate caliber vessel with severe stenosis. 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  5. Elevated LVEDP   Recommendations: He has severe left main and three vessel CAD. I will ask CT surgery to see him to discuss CABG. Given chronic kidney disease, I will admit him for hydration. Will hold Plavix. He will need Plavix washout prior to surgery. Will arrange echo tomorrow to assess LVEF. Will start ASA. Continue beta blocker and statin. Given elevated filling pressures, one dose of IV Lasix tonight.    TTE: 11/21/2018 Echo 11/21/18 demonstrated  1. The left ventricle has  normal systolic function of 82-99%. The cavity size was mildly increased. There is no increased left ventricular wall thickness. Echo evidence of impaired diastolic relaxation.  2. The right ventricle has normal systolic function. The cavity was normal. There is no increase in right ventricular wall thickness.  3. Right atrial size was mildly dilated.  4. The mitral valve is normal in structure. There is mild to moderate mitral annular calcification present. No evidence of mitral valve stenosis. No mitral regurgitation.  5. The tricuspid valve is normal in structure.  6. The aortic valve is tricuspid There is mild calcification of the aortic valve. No aortic stenosis.  7. There is mild dilatation of the aortic root and ascending aorta.  8. No evidence of left ventricular regional wall motion abnormalities.  9. No complete TR doppler jet so unable to estimate PA systolic pressure. LA 2m     Labs/Other Tests and Data Reviewed:    EKG:  An ECG dated 12/09/2018 was personally reviewed today and demonstrated:  Normal sinus rhythm with inferior lateral Q waves and T wave inversion laterally  Recent Labs: 03/25/2018: ALT 18 11/27/2018: Magnesium 2.8 11/29/2018: Hemoglobin 10.5; Platelets 177 12/02/2018: BUN 29; Creatinine, Ser 1.50; Potassium 3.9; Sodium 142   Recent Lipid Panel Lab Results  Component Value Date/Time   CHOL 138 11/12/2018 03:28 PM   TRIG 214.0 (H) 11/12/2018 03:28 PM   HDL 25.60 (L) 11/12/2018 03:28 PM   CHOLHDL 5 11/12/2018 03:28 PM   LDLCALC 56 06/22/2017 05:16 AM   LDLDIRECT 80.0 11/12/2018 03:28 PM    Wt Readings from Last 3 Encounters:  01/28/19 209 lb (94.8 kg)  01/01/19 223 lb 9.6 oz (101.4 kg)  12/17/18 220 lb 6.4 oz (100 kg)     Objective:    Vital Signs:  BP 134/72   Ht '5\' 7"'  (1.702 m)   Wt 209 lb (94.8 kg)   BMI 32.73 kg/m    Well nourished, well developed male in noacute distress. JVD or edema  ASSESSMENT & PLAN:    1. 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 Plavix stopped because he's on Eliquis for AFib. 2. Post op Atrial fib found on ILR longest episode 12 hrs. On Elquis for CHADSVASC=7. No recurrence on recent LINQ  3. Chronic diastolic CHF=patient had significant edema lov and meds adjusted as stated above. Edema resolved and breathing better.  Will have home health draw labs including CBC be met and BNP and check for CHF.  Follow-up with me in 2 months either office visit ordered oximetry. 4. History of cryptogenic stroke 2018 preiousely on plavix and asa but plavix stopped because of afib and eliquis. 5. Essential HTN BP well controlled 6. CKD- no ACEI/ARB Crt. 1.5 12/02/18  COVID-19 Education: The signs and symptoms of COVID-19 were discussed with the patient and how to seek care for testing (follow up with PCP or arrange E-visit).  The importance of social distancing was discussed today.  Time:   Today, I have spent 15 minutes with the patient with telehealth technology discussing the above problems.     Medication Adjustments/Labs and Tests Ordered: Current medicines are reviewed at length with the patient today.  Concerns regarding medicines are outlined above.  Tests Ordered: No orders of the defined types were placed in this encounter.  Medication Changes: No orders of the defined types were placed in this encounter.   Disposition:  Follow up in 2 month(s) with Ermalinda Barrios PA-C  Signed, Ermalinda Barrios, PA-C  01/28/2019 12:34 PM    [facility]

## 2019-01-28 ENCOUNTER — Encounter: Payer: Self-pay | Admitting: Physician Assistant

## 2019-01-28 ENCOUNTER — Other Ambulatory Visit: Payer: Self-pay

## 2019-01-28 ENCOUNTER — Telehealth (INDEPENDENT_AMBULATORY_CARE_PROVIDER_SITE_OTHER): Payer: Medicare Other | Admitting: Physician Assistant

## 2019-01-28 VITALS — BP 134/72 | Ht 67.0 in | Wt 209.0 lb

## 2019-01-28 DIAGNOSIS — I480 Paroxysmal atrial fibrillation: Secondary | ICD-10-CM

## 2019-01-28 DIAGNOSIS — I10 Essential (primary) hypertension: Secondary | ICD-10-CM

## 2019-01-28 DIAGNOSIS — N189 Chronic kidney disease, unspecified: Secondary | ICD-10-CM | POA: Diagnosis not present

## 2019-01-28 DIAGNOSIS — Z951 Presence of aortocoronary bypass graft: Secondary | ICD-10-CM | POA: Diagnosis not present

## 2019-01-28 DIAGNOSIS — I5033 Acute on chronic diastolic (congestive) heart failure: Secondary | ICD-10-CM | POA: Diagnosis not present

## 2019-01-28 NOTE — Patient Instructions (Signed)
Medication Instructions:  Your physician recommends that you continue on your current medications as directed. Please refer to the Current Medication list given to you today.  If you need a refill on your cardiac medications before your next appointment, please call your pharmacy.   Lab work: We will have your labs drawn with [place]: BMET, CBC, BNP  If you have labs (blood work) drawn today and your tests are completely normal, you will receive your results only by: Marland Kitchen MyChart Message (if you have MyChart) OR . A paper copy in the mail If you have any lab test that is abnormal or we need to change your treatment, we will call you to review the results.  Testing/Procedures: None ordered  Follow-Up: . Follow up with Ermalinda Barrios, PA in 2 months  Any Other Special Instructions Will Be Listed Below (If Applicable).

## 2019-01-29 ENCOUNTER — Other Ambulatory Visit: Payer: Self-pay | Admitting: Internal Medicine

## 2019-01-29 DIAGNOSIS — E1122 Type 2 diabetes mellitus with diabetic chronic kidney disease: Secondary | ICD-10-CM | POA: Diagnosis not present

## 2019-01-29 DIAGNOSIS — Z7902 Long term (current) use of antithrombotics/antiplatelets: Secondary | ICD-10-CM | POA: Diagnosis not present

## 2019-01-29 DIAGNOSIS — E559 Vitamin D deficiency, unspecified: Secondary | ICD-10-CM | POA: Diagnosis not present

## 2019-01-29 DIAGNOSIS — Z87891 Personal history of nicotine dependence: Secondary | ICD-10-CM | POA: Diagnosis not present

## 2019-01-29 DIAGNOSIS — M159 Polyosteoarthritis, unspecified: Secondary | ICD-10-CM | POA: Diagnosis not present

## 2019-01-29 DIAGNOSIS — Z9181 History of falling: Secondary | ICD-10-CM | POA: Diagnosis not present

## 2019-01-29 DIAGNOSIS — Z794 Long term (current) use of insulin: Secondary | ICD-10-CM | POA: Diagnosis not present

## 2019-01-29 DIAGNOSIS — Z7901 Long term (current) use of anticoagulants: Secondary | ICD-10-CM | POA: Diagnosis not present

## 2019-01-29 DIAGNOSIS — I5033 Acute on chronic diastolic (congestive) heart failure: Secondary | ICD-10-CM | POA: Diagnosis not present

## 2019-01-29 DIAGNOSIS — Z7982 Long term (current) use of aspirin: Secondary | ICD-10-CM | POA: Diagnosis not present

## 2019-01-29 DIAGNOSIS — I480 Paroxysmal atrial fibrillation: Secondary | ICD-10-CM | POA: Diagnosis not present

## 2019-01-29 DIAGNOSIS — I2510 Atherosclerotic heart disease of native coronary artery without angina pectoris: Secondary | ICD-10-CM | POA: Diagnosis not present

## 2019-01-29 DIAGNOSIS — Z8673 Personal history of transient ischemic attack (TIA), and cerebral infarction without residual deficits: Secondary | ICD-10-CM | POA: Diagnosis not present

## 2019-01-29 DIAGNOSIS — I5043 Acute on chronic combined systolic (congestive) and diastolic (congestive) heart failure: Secondary | ICD-10-CM | POA: Diagnosis not present

## 2019-01-29 DIAGNOSIS — Z951 Presence of aortocoronary bypass graft: Secondary | ICD-10-CM | POA: Diagnosis not present

## 2019-01-29 DIAGNOSIS — E785 Hyperlipidemia, unspecified: Secondary | ICD-10-CM | POA: Diagnosis not present

## 2019-01-29 DIAGNOSIS — Z602 Problems related to living alone: Secondary | ICD-10-CM | POA: Diagnosis not present

## 2019-01-29 DIAGNOSIS — I130 Hypertensive heart and chronic kidney disease with heart failure and stage 1 through stage 4 chronic kidney disease, or unspecified chronic kidney disease: Secondary | ICD-10-CM | POA: Diagnosis not present

## 2019-01-29 DIAGNOSIS — N189 Chronic kidney disease, unspecified: Secondary | ICD-10-CM | POA: Diagnosis not present

## 2019-01-29 DIAGNOSIS — M47892 Other spondylosis, cervical region: Secondary | ICD-10-CM | POA: Diagnosis not present

## 2019-02-03 ENCOUNTER — Encounter: Payer: Self-pay | Admitting: Endocrinology

## 2019-02-04 ENCOUNTER — Ambulatory Visit (INDEPENDENT_AMBULATORY_CARE_PROVIDER_SITE_OTHER): Payer: Medicare Other | Admitting: Endocrinology

## 2019-02-04 ENCOUNTER — Other Ambulatory Visit: Payer: Self-pay

## 2019-02-04 DIAGNOSIS — I2510 Atherosclerotic heart disease of native coronary artery without angina pectoris: Secondary | ICD-10-CM | POA: Diagnosis not present

## 2019-02-04 DIAGNOSIS — N189 Chronic kidney disease, unspecified: Secondary | ICD-10-CM | POA: Diagnosis not present

## 2019-02-04 DIAGNOSIS — Z794 Long term (current) use of insulin: Secondary | ICD-10-CM

## 2019-02-04 DIAGNOSIS — E1122 Type 2 diabetes mellitus with diabetic chronic kidney disease: Secondary | ICD-10-CM | POA: Diagnosis not present

## 2019-02-04 DIAGNOSIS — E119 Type 2 diabetes mellitus without complications: Secondary | ICD-10-CM | POA: Diagnosis not present

## 2019-02-04 DIAGNOSIS — I130 Hypertensive heart and chronic kidney disease with heart failure and stage 1 through stage 4 chronic kidney disease, or unspecified chronic kidney disease: Secondary | ICD-10-CM | POA: Diagnosis not present

## 2019-02-04 DIAGNOSIS — I5033 Acute on chronic diastolic (congestive) heart failure: Secondary | ICD-10-CM | POA: Diagnosis not present

## 2019-02-04 DIAGNOSIS — I480 Paroxysmal atrial fibrillation: Secondary | ICD-10-CM | POA: Diagnosis not present

## 2019-02-04 MED ORDER — INSULIN GLARGINE 100 UNIT/ML SOLOSTAR PEN: 90.0000 [IU] | PEN_INJECTOR | SUBCUTANEOUS | 11 refills | Status: DC

## 2019-02-04 NOTE — Progress Notes (Signed)
Subjective:    Patient ID: [employer], male    DOB: 1941-05-20, 78 y.o.   MRN: [id number]  HPI  telehealth visit today via doxy video visit.  Alternatives to telehealth are presented to this patient, and the patient agrees to the telehealth visit. Pt is advised of the cost of the visit, and agrees to this, also.   Patient is at home, and I am at the office.   Pt returns for f/u of diabetes mellitus:  DM type: Insulin-requiring type 2 DM.   Dx'ed: 2094 Complications: polyneuropathy, retinopathy, CVA, CAD, and renal insufficiency.  Therapy: insulin since 2005 DKA: never Severe hypoglycemia: last episode was in 2014.   Pancreatitis: never.  Pancreatic imaging: normal CT in 2006 Other: he eats 2 meals per day; he takes multiple daily injections.  Interval history: He has lost weight since CABG (he says now 209).  He now takes lantus, 100 units qd.  He says cbg varies from 78-110.   Past Medical History:  Diagnosis Date  . 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 Arthritis   . Cervical spondylosis   . Diabetes mellitus   . Hyperlipidemia   . Hypertension   . Stroke ([place]) 06/19/2017  . Vitamin D deficiency     Past Surgical History:  Procedure Laterality Date  . ABCESS DRAINAGE Left    buttocks  . CARDIOVASCULAR STRESS TEST  10/12/1999   EF 63%. NO ISCHEMIA  . COLONOSCOPY W/ POLYPECTOMY     5 polyps  . CORONARY ARTERY BYPASS GRAFT N/A 11/26/2018   Procedure: CORONARY ARTERY BYPASS GRAFTING (CABG), ON PUMP, TIMES FOUR, USING LEFT INTERNAL MAMMARY ARTERY AND ENDOSCOPICALLY HARVESTED LEFT SAPHENOUS VEIN;  Surgeon: Gaye Pollack, MD;  Location: [place];  Service: Open Heart Surgery;  Laterality: N/A;  . EUS N/A 03/11/2015   Procedure: LOWER ENDOSCOPIC ULTRASOUND (EUS);  Surgeon: Milus Banister, MD;  Location: [facility];  Service: Endoscopy;  Laterality: N/A;  . FLEXIBLE SIGMOIDOSCOPY N/A 02/02/2015   Procedure: FLEXIBLE  SIGMOIDOSCOPY;  Surgeon: Inda Castle, MD;  Location: [facility];  Service: Endoscopy;  Laterality: N/A;  ERBE  . LEFT HEART CATH AND CORONARY ANGIOGRAPHY N/A 11/20/2018   Procedure: LEFT HEART CATH AND CORONARY ANGIOGRAPHY;  Surgeon: Burnell Blanks, MD;  Location: [place] [facility];  Service: Cardiovascular;  Laterality: N/A;  . LOOP RECORDER INSERTION N/A 08/14/2017   Procedure: LOOP RECORDER INSERTION;  Surgeon: Thompson Grayer, MD;  Location: [place] [facility];  Service: Cardiovascular;  Laterality: N/A;  . PARTIAL PROCTECTOMY BY TEM N/A 04/08/2015   Procedure: TEM PARTIAL PROCTECTOMY OF RECTAL MASS;  Surgeon: Michael Boston, MD;  Location: [facility];  Service: General;  Laterality: N/A;  . TEE WITHOUT CARDIOVERSION N/A 06/25/2017   Procedure: TRANSESOPHAGEAL ECHOCARDIOGRAM (TEE);  Surgeon: Skeet Latch, MD;  Location: [place];  Service: Cardiovascular;  Laterality: N/A;  . TEE WITHOUT CARDIOVERSION N/A 11/26/2018   Procedure: TRANSESOPHAGEAL ECHOCARDIOGRAM (TEE);  Surgeon: Gaye Pollack, MD;  Location: [place];  Service: Open Heart Surgery;  Laterality: N/A;  . TONSILLECTOMY AND ADENOIDECTOMY     as child    Social History   Socioeconomic History  . Marital status: Divorced    Spouse name: Not on file  . Number of children: 2  . Years of education: Not on file  . Highest education level: Not on file  Occupational History  . Occupation: retired    Fish farm manager: [employer]  Social Needs  . Financial resource strain: Not on file  . Food insecurity:    Worry: Not on file    Inability: Not on file  . Transportation needs:    Medical: Not on file    Non-medical: Not on file  Tobacco Use  . Smoking status: Former Smoker    Types: Cigarettes    Last attempt to quit: 08/13/1994    Years since quitting: 24.5  . Smokeless tobacco: Never Used  Substance and Sexual Activity  . Alcohol use: No    Alcohol/week: 0.0 standard drinks  . Drug use: No  . Sexual activity: Not Currently   Lifestyle  . Physical activity:    Days per week: Not on file    Minutes per session: Not on file  . Stress: Not on file  Relationships  . Social connections:    Talks on phone: Not on file    Gets together: Not on file    Attends religious service: Not on file    Active member of club or organization: Not on file    Attends meetings of clubs or organizations: Not on file    Relationship status: Not on file  . Intimate partner violence:    Fear of current or ex partner: Not on file    Emotionally abused: Not on file    Physically abused: Not on file    Forced sexual activity: Not on file  Other Topics Concern  . Not on file  Social History Narrative  . Not on file    Current Outpatient Medications on File Prior to Visit  Medication Sig Dispense Refill  . amLODipine (NORVASC) 5 MG tablet Take 1 tablet (5 mg total) by mouth daily. 90 tablet 3  . apixaban (ELIQUIS) 5 MG TABS tablet Take 1 tablet (5 mg total) by mouth 2 (two) times daily. 60 tablet 6  . aspirin 81 MG tablet Take 1 tablet (81 mg total) by mouth daily.    . fenofibrate micronized (LOFIBRA) 67 MG capsule TAKE 1 CAPSULE DAILY BEFORE BREAKFAST 90 capsule 0  . furosemide (LASIX) 40 MG tablet Take 2 tablets once a day for 3 days only and then take 1 tablet once a day 94 tablet 3  . hydrALAZINE (APRESOLINE) 50 MG tablet Take 1 tablet (50 mg total) by mouth 3 (three) times daily. 270 tablet 3  . metoprolol tartrate (LOPRESSOR) 25 MG tablet Take 1 tablet (25 mg total) by mouth 2 (two) times daily. 60 tablet 3  . ONE TOUCH ULTRA TEST test strip USE 1 STRIP TWICE A DAY 200 each 3  . ONETOUCH DELICA LANCETS 21H MISC     . potassium chloride (K-DUR,KLOR-CON) 10 MEQ tablet Take 4 tablets daily for 3 days only and then take 2 tablets daily 186 tablet 3  . rosuvastatin (CRESTOR) 20 MG tablet TAKE 1 TABLET BY MOUTH AT BEDTIME NEEDS APPT FOR REFILLS 90 tablet 0  . rosuvastatin (CRESTOR) 40 MG tablet Take 1 tablet (40 mg total) by  mouth at bedtime. 30 tablet 3  . traMADol (ULTRAM) 50 MG tablet Take 1 tablet (50 mg total) by mouth every 6 (six) hours as needed for moderate pain or severe pain. 28 tablet 0   No current facility-administered medications on file prior to visit.     No Known Allergies  Family History  Problem Relation Age of Onset  . Cancer Father        "all over"  . Coronary artery disease Brother   .  Diabetes Brother   . Stroke Mother   . Diabetes Mother   . Cancer Brother   . Colon cancer Neg Hx   . Esophageal cancer Neg Hx   . Rectal cancer Neg Hx   . Stomach cancer Neg Hx      Review of Systems He denies hypoglycemia.      Objective:   Physical Exam   Lab Results  Component Value Date   HGBA1C 7.1 (H) 11/26/2018       Assessment & Plan:  CAD: new.  In this setting, he should avoid hypoglycemia Insulin-requiring type 2 DM: apparently overcontrolled Weight loss: prob due to surgery.  We discussed.  He is advised to continue her efforts.   Patient Instructions  Please reduce the lantus to 90 units each morning.   check your blood sugar twice a day.  vary the time of day when you check, between before the 3 meals, and at bedtime.  also check if you have symptoms of your blood sugar being too high or too low.  please keep a record of the readings and bring it to your next appointment here (or you can bring the meter itself).  You can write it on any piece of paper.  please call us sooner if your blood sugar goes below 70, or if you have a lot of readings over 200. Please have a follow-up appointment in 1 month.

## 2019-02-04 NOTE — Patient Instructions (Signed)
Please reduce the lantus to 90 units each morning.   check your blood sugar twice a day.  vary the time of day when you check, between before the 3 meals, and at bedtime.  also check if you have symptoms of your blood sugar being too high or too low.  please keep a record of the readings and bring it to your next appointment here (or you can bring the meter itself).  You can write it on any piece of paper.  please call us sooner if your blood sugar goes below 70, or if you have a lot of readings over 200. Please have a follow-up appointment in 1 month.

## 2019-02-07 ENCOUNTER — Telehealth: Payer: Self-pay

## 2019-02-07 DIAGNOSIS — I5033 Acute on chronic diastolic (congestive) heart failure: Secondary | ICD-10-CM | POA: Diagnosis not present

## 2019-02-07 DIAGNOSIS — I2510 Atherosclerotic heart disease of native coronary artery without angina pectoris: Secondary | ICD-10-CM | POA: Diagnosis not present

## 2019-02-07 DIAGNOSIS — I480 Paroxysmal atrial fibrillation: Secondary | ICD-10-CM | POA: Diagnosis not present

## 2019-02-07 DIAGNOSIS — I130 Hypertensive heart and chronic kidney disease with heart failure and stage 1 through stage 4 chronic kidney disease, or unspecified chronic kidney disease: Secondary | ICD-10-CM | POA: Diagnosis not present

## 2019-02-07 DIAGNOSIS — N189 Chronic kidney disease, unspecified: Secondary | ICD-10-CM | POA: Diagnosis not present

## 2019-02-07 DIAGNOSIS — E1122 Type 2 diabetes mellitus with diabetic chronic kidney disease: Secondary | ICD-10-CM | POA: Diagnosis not present

## 2019-02-07 NOTE — Telephone Encounter (Signed)
-----   Message from Imogene Burn, PA-C sent at 02/06/2019  4:42 PM EDT ----- Overall labs stable. Kidney function up a little but about the same. No changes.

## 2019-02-07 NOTE — Telephone Encounter (Signed)
Called and made patient aware that CBC and BMET that [facility] drew on 01/29/19 were stable. Made patient aware that his kidney function was up a little but about the same. Made him aware that there were no changes needed at this time. BNP was not drawn.

## 2019-02-08 ENCOUNTER — Other Ambulatory Visit: Payer: Self-pay | Admitting: Internal Medicine

## 2019-02-09 NOTE — Telephone Encounter (Signed)
This is not on his current med list, please advise if appropriate to fill

## 2019-02-10 NOTE — Telephone Encounter (Signed)
He should not be taking this

## 2019-02-11 ENCOUNTER — Telehealth (HOSPITAL_COMMUNITY): Payer: Self-pay | Admitting: *Deleted

## 2019-02-11 DIAGNOSIS — I2510 Atherosclerotic heart disease of native coronary artery without angina pectoris: Secondary | ICD-10-CM | POA: Diagnosis not present

## 2019-02-11 DIAGNOSIS — I480 Paroxysmal atrial fibrillation: Secondary | ICD-10-CM | POA: Diagnosis not present

## 2019-02-11 DIAGNOSIS — N189 Chronic kidney disease, unspecified: Secondary | ICD-10-CM | POA: Diagnosis not present

## 2019-02-11 DIAGNOSIS — I130 Hypertensive heart and chronic kidney disease with heart failure and stage 1 through stage 4 chronic kidney disease, or unspecified chronic kidney disease: Secondary | ICD-10-CM | POA: Diagnosis not present

## 2019-02-11 DIAGNOSIS — I5033 Acute on chronic diastolic (congestive) heart failure: Secondary | ICD-10-CM | POA: Diagnosis not present

## 2019-02-11 DIAGNOSIS — E1122 Type 2 diabetes mellitus with diabetic chronic kidney disease: Secondary | ICD-10-CM | POA: Diagnosis not present

## 2019-02-12 ENCOUNTER — Other Ambulatory Visit: Payer: Self-pay | Admitting: Internal Medicine

## 2019-02-12 DIAGNOSIS — I10 Essential (primary) hypertension: Secondary | ICD-10-CM

## 2019-02-17 ENCOUNTER — Other Ambulatory Visit: Payer: Self-pay

## 2019-02-17 ENCOUNTER — Ambulatory Visit (INDEPENDENT_AMBULATORY_CARE_PROVIDER_SITE_OTHER): Payer: Medicare Other | Admitting: *Deleted

## 2019-02-17 DIAGNOSIS — I6389 Other cerebral infarction: Secondary | ICD-10-CM | POA: Diagnosis not present

## 2019-02-18 LAB — CUP PACEART REMOTE DEVICE CHECK
Date Time Interrogation Session: 20200505020826
Implantable Pulse Generator Implant Date: 20181030

## 2019-02-20 DIAGNOSIS — I130 Hypertensive heart and chronic kidney disease with heart failure and stage 1 through stage 4 chronic kidney disease, or unspecified chronic kidney disease: Secondary | ICD-10-CM | POA: Diagnosis not present

## 2019-02-20 DIAGNOSIS — N189 Chronic kidney disease, unspecified: Secondary | ICD-10-CM | POA: Diagnosis not present

## 2019-02-20 DIAGNOSIS — I5033 Acute on chronic diastolic (congestive) heart failure: Secondary | ICD-10-CM | POA: Diagnosis not present

## 2019-02-20 DIAGNOSIS — I2510 Atherosclerotic heart disease of native coronary artery without angina pectoris: Secondary | ICD-10-CM | POA: Diagnosis not present

## 2019-02-20 DIAGNOSIS — E1122 Type 2 diabetes mellitus with diabetic chronic kidney disease: Secondary | ICD-10-CM | POA: Diagnosis not present

## 2019-02-20 DIAGNOSIS — I480 Paroxysmal atrial fibrillation: Secondary | ICD-10-CM | POA: Diagnosis not present

## 2019-02-22 ENCOUNTER — Other Ambulatory Visit: Payer: Self-pay | Admitting: Physician Assistant

## 2019-02-23 ENCOUNTER — Other Ambulatory Visit: Payer: Self-pay | Admitting: Internal Medicine

## 2019-02-24 NOTE — Progress Notes (Signed)
Carelink Summary Report / Loop Recorder 

## 2019-02-25 DIAGNOSIS — I480 Paroxysmal atrial fibrillation: Secondary | ICD-10-CM | POA: Diagnosis not present

## 2019-02-25 DIAGNOSIS — I130 Hypertensive heart and chronic kidney disease with heart failure and stage 1 through stage 4 chronic kidney disease, or unspecified chronic kidney disease: Secondary | ICD-10-CM | POA: Diagnosis not present

## 2019-02-25 DIAGNOSIS — I2510 Atherosclerotic heart disease of native coronary artery without angina pectoris: Secondary | ICD-10-CM | POA: Diagnosis not present

## 2019-02-25 DIAGNOSIS — N189 Chronic kidney disease, unspecified: Secondary | ICD-10-CM | POA: Diagnosis not present

## 2019-02-25 DIAGNOSIS — E1122 Type 2 diabetes mellitus with diabetic chronic kidney disease: Secondary | ICD-10-CM | POA: Diagnosis not present

## 2019-02-25 DIAGNOSIS — I5033 Acute on chronic diastolic (congestive) heart failure: Secondary | ICD-10-CM | POA: Diagnosis not present

## 2019-02-28 DIAGNOSIS — I130 Hypertensive heart and chronic kidney disease with heart failure and stage 1 through stage 4 chronic kidney disease, or unspecified chronic kidney disease: Secondary | ICD-10-CM | POA: Diagnosis not present

## 2019-02-28 DIAGNOSIS — Z8673 Personal history of transient ischemic attack (TIA), and cerebral infarction without residual deficits: Secondary | ICD-10-CM | POA: Diagnosis not present

## 2019-02-28 DIAGNOSIS — I480 Paroxysmal atrial fibrillation: Secondary | ICD-10-CM | POA: Diagnosis not present

## 2019-02-28 DIAGNOSIS — Z951 Presence of aortocoronary bypass graft: Secondary | ICD-10-CM | POA: Diagnosis not present

## 2019-02-28 DIAGNOSIS — M47892 Other spondylosis, cervical region: Secondary | ICD-10-CM | POA: Diagnosis not present

## 2019-02-28 DIAGNOSIS — Z7902 Long term (current) use of antithrombotics/antiplatelets: Secondary | ICD-10-CM | POA: Diagnosis not present

## 2019-02-28 DIAGNOSIS — Z794 Long term (current) use of insulin: Secondary | ICD-10-CM | POA: Diagnosis not present

## 2019-02-28 DIAGNOSIS — Z602 Problems related to living alone: Secondary | ICD-10-CM | POA: Diagnosis not present

## 2019-02-28 DIAGNOSIS — I5033 Acute on chronic diastolic (congestive) heart failure: Secondary | ICD-10-CM | POA: Diagnosis not present

## 2019-02-28 DIAGNOSIS — Z87891 Personal history of nicotine dependence: Secondary | ICD-10-CM | POA: Diagnosis not present

## 2019-02-28 DIAGNOSIS — Z9181 History of falling: Secondary | ICD-10-CM | POA: Diagnosis not present

## 2019-02-28 DIAGNOSIS — Z7901 Long term (current) use of anticoagulants: Secondary | ICD-10-CM | POA: Diagnosis not present

## 2019-02-28 DIAGNOSIS — Z7982 Long term (current) use of aspirin: Secondary | ICD-10-CM | POA: Diagnosis not present

## 2019-02-28 DIAGNOSIS — N189 Chronic kidney disease, unspecified: Secondary | ICD-10-CM | POA: Diagnosis not present

## 2019-02-28 DIAGNOSIS — E785 Hyperlipidemia, unspecified: Secondary | ICD-10-CM | POA: Diagnosis not present

## 2019-02-28 DIAGNOSIS — I2510 Atherosclerotic heart disease of native coronary artery without angina pectoris: Secondary | ICD-10-CM | POA: Diagnosis not present

## 2019-02-28 DIAGNOSIS — M159 Polyosteoarthritis, unspecified: Secondary | ICD-10-CM | POA: Diagnosis not present

## 2019-02-28 DIAGNOSIS — E559 Vitamin D deficiency, unspecified: Secondary | ICD-10-CM | POA: Diagnosis not present

## 2019-02-28 DIAGNOSIS — E1122 Type 2 diabetes mellitus with diabetic chronic kidney disease: Secondary | ICD-10-CM | POA: Diagnosis not present

## 2019-03-04 ENCOUNTER — Other Ambulatory Visit: Payer: Self-pay

## 2019-03-06 ENCOUNTER — Encounter: Payer: Self-pay | Admitting: Endocrinology

## 2019-03-06 ENCOUNTER — Other Ambulatory Visit: Payer: Self-pay

## 2019-03-06 ENCOUNTER — Ambulatory Visit (INDEPENDENT_AMBULATORY_CARE_PROVIDER_SITE_OTHER): Payer: Medicare Other | Admitting: Endocrinology

## 2019-03-06 VITALS — BP 182/60 | HR 61 | Temp 98.2°F | Wt 217.0 lb

## 2019-03-06 DIAGNOSIS — E1159 Type 2 diabetes mellitus with other circulatory complications: Secondary | ICD-10-CM

## 2019-03-06 DIAGNOSIS — R609 Edema, unspecified: Secondary | ICD-10-CM

## 2019-03-06 DIAGNOSIS — E119 Type 2 diabetes mellitus without complications: Secondary | ICD-10-CM | POA: Diagnosis not present

## 2019-03-06 DIAGNOSIS — E1129 Type 2 diabetes mellitus with other diabetic kidney complication: Secondary | ICD-10-CM

## 2019-03-06 DIAGNOSIS — Z794 Long term (current) use of insulin: Secondary | ICD-10-CM

## 2019-03-06 LAB — POCT GLYCOSYLATED HEMOGLOBIN (HGB A1C): Hemoglobin A1C: 6.5 % — AB (ref 4.0–5.6)

## 2019-03-06 MED ORDER — INSULIN GLARGINE 100 UNIT/ML SOLOSTAR PEN: 80.0000 [IU] | PEN_INJECTOR | SUBCUTANEOUS | 11 refills | Status: DC

## 2019-03-06 NOTE — Progress Notes (Signed)
Subjective:    Patient ID: [employer], male    DOB: Jul 02, 1941, 78 y.o.   MRN: [id number]  HPI Pt returns for f/u of diabetes mellitus:  DM type: Insulin-requiring type 2 DM.   Dx'ed: 2330 Complications: polyneuropathy, retinopathy, CVA, CAD, and renal insufficiency.  Therapy: insulin since 2005 DKA: never Severe hypoglycemia: last episode was in 2014.   Pancreatitis: never.  Pancreatic imaging: normal CT in 2006 Other: he eats 2 meals per day; he takes QD insulin, after poor results with multiple daily injections Interval history: He now takes lantus, 90 units qd.  He says cbg varies from 90-151.  Past Medical History:  Diagnosis Date  . 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 Arthritis   . Cervical spondylosis   . Diabetes mellitus   . Hyperlipidemia   . Hypertension   . Stroke ([place]) 06/19/2017  . Vitamin D deficiency     Past Surgical History:  Procedure Laterality Date  . ABCESS DRAINAGE Left    buttocks  . CARDIOVASCULAR STRESS TEST  10/12/1999   EF 63%. NO ISCHEMIA  . COLONOSCOPY W/ POLYPECTOMY     5 polyps  . CORONARY ARTERY BYPASS GRAFT N/A 11/26/2018   Procedure: CORONARY ARTERY BYPASS GRAFTING (CABG), ON PUMP, TIMES FOUR, USING LEFT INTERNAL MAMMARY ARTERY AND ENDOSCOPICALLY HARVESTED LEFT SAPHENOUS VEIN;  Surgeon: Gaye Pollack, MD;  Location: [place];  Service: Open Heart Surgery;  Laterality: N/A;  . EUS N/A 03/11/2015   Procedure: LOWER ENDOSCOPIC ULTRASOUND (EUS);  Surgeon: Milus Banister, MD;  Location: [facility];  Service: Endoscopy;  Laterality: N/A;  . FLEXIBLE SIGMOIDOSCOPY N/A 02/02/2015   Procedure: FLEXIBLE SIGMOIDOSCOPY;  Surgeon: Inda Castle, MD;  [facility];  Service: Endoscopy;  Laterality: N/A;  ERBE  . LEFT HEART CATH AND CORONARY ANGIOGRAPHY N/A 11/20/2018   Procedure: LEFT HEART CATH AND CORONARY ANGIOGRAPHY;  Surgeon: Burnell Blanks, MD;  Location: [place]  [facility];  Service: Cardiovascular;  Laterality: N/A;  . LOOP RECORDER INSERTION N/A 08/14/2017   Procedure: LOOP RECORDER INSERTION;  Surgeon: Thompson Grayer, MD;  Location: [place] [facility];  Service: Cardiovascular;  Laterality: N/A;  . PARTIAL PROCTECTOMY BY TEM N/A 04/08/2015   Procedure: TEM PARTIAL PROCTECTOMY OF RECTAL MASS;  Surgeon: Michael Boston, MD;  Location: [facility];  Service: General;  Laterality: N/A;  . TEE WITHOUT CARDIOVERSION N/A 06/25/2017   Procedure: TRANSESOPHAGEAL ECHOCARDIOGRAM (TEE);  Surgeon: Skeet Latch, MD;  Location: [place];  Service: Cardiovascular;  Laterality: N/A;  . TEE WITHOUT CARDIOVERSION N/A 11/26/2018   Procedure: TRANSESOPHAGEAL ECHOCARDIOGRAM (TEE);  Surgeon: Gaye Pollack, MD;  Location: [place];  Service: Open Heart Surgery;  Laterality: N/A;  . TONSILLECTOMY AND ADENOIDECTOMY     as child    Social History   Socioeconomic History  . Marital status: Divorced    Spouse name: Not on file  . Number of children: 2  . Years of education: Not on file  . Highest education level: Not on file  Occupational History  . Occupation: retired    Fish farm manager: [employer]  Social Needs  . Financial resource strain: Not on file  . Food insecurity:    Worry: Not on file    Inability: Not on file  . Transportation needs:    Medical: Not on file    Non-medical: Not on file  Tobacco Use  . Smoking status: Former Smoker    Types:  Cigarettes    Last attempt to quit: 08/13/1994    Years since quitting: 24.5  . Smokeless tobacco: Never Used  Substance and Sexual Activity  . Alcohol use: No    Alcohol/week: 0.0 standard drinks  . Drug use: No  . Sexual activity: Not Currently  Lifestyle  . Physical activity:    Days per week: Not on file    Minutes per session: Not on file  . Stress: Not on file  Relationships  . Social connections:    Talks on phone: Not on file    Gets together: Not on file    Attends religious service: Not on file    Active member  of club or organization: Not on file    Attends meetings of clubs or organizations: Not on file    Relationship status: Not on file  . Intimate partner violence:    Fear of current or ex partner: Not on file    Emotionally abused: Not on file    Physically abused: Not on file    Forced sexual activity: Not on file  Other Topics Concern  . Not on file  Social History Narrative  . Not on file    Current Outpatient Medications on File Prior to Visit  Medication Sig Dispense Refill  . amLODipine (NORVASC) 5 MG tablet Take 1 tablet (5 mg total) by mouth daily. 90 tablet 3  . apixaban (ELIQUIS) 5 MG TABS tablet Take 1 tablet (5 mg total) by mouth 2 (two) times daily. 60 tablet 6  . aspirin 81 MG tablet Take 1 tablet (81 mg total) by mouth daily.    . fenofibrate micronized (LOFIBRA) 67 MG capsule TAKE 1 CAPSULE DAILY BEFORE BREAKFAST 90 capsule 1  . furosemide (LASIX) 40 MG tablet Take 2 tablets once a day for 3 days only and then take 1 tablet once a day 94 tablet 3  . hydrALAZINE (APRESOLINE) 50 MG tablet Take 1 tablet (50 mg total) by mouth 3 (three) times daily. 270 tablet 3  . metoprolol tartrate (LOPRESSOR) 25 MG tablet Take 1 tablet (25 mg total) by mouth 2 (two) times daily. 60 tablet 3  . ONE TOUCH ULTRA TEST test strip USE 1 STRIP TWICE A DAY 200 each 3  . ONETOUCH DELICA LANCETS 87F MISC     . potassium chloride (K-DUR,KLOR-CON) 10 MEQ tablet Take 4 tablets daily for 3 days only and then take 2 tablets daily 186 tablet 3  . rosuvastatin (CRESTOR) 20 MG tablet TAKE 1 TABLET BY MOUTH AT BEDTIME NEEDS APPT FOR REFILLS 90 tablet 0  . rosuvastatin (CRESTOR) 40 MG tablet Take 1 tablet (40 mg total) by mouth at bedtime. 30 tablet 3  . traMADol (ULTRAM) 50 MG tablet Take 1 tablet (50 mg total) by mouth every 6 (six) hours as needed for moderate pain or severe pain. 28 tablet 0   No current facility-administered medications on file prior to visit.     No Known Allergies  Family History   Problem Relation Age of Onset  . Cancer Father        "all over"  . Coronary artery disease Brother   . Diabetes Brother   . Stroke Mother   . Diabetes Mother   . Cancer Brother   . Colon cancer Neg Hx   . Esophageal cancer Neg Hx   . Rectal cancer Neg Hx   . Stomach cancer Neg Hx     BP (!) 182/60 (BP Location: Right Arm, Patient  Position: Sitting, Cuff Size: Normal)   Pulse 61   Temp 98.2 F (36.8 C) (Oral)   Wt 217 lb (98.4 kg)   SpO2 97%   BMI 33.99 kg/m    Review of Systems He denies hypoglycemia    Objective:   Physical Exam VITAL SIGNS:  See vs page GENERAL: no distress Pulses: foot pulses are intact bilaterally.   MSK: no deformity of the feet or ankles.  CV: 1+ bilat edema of the legs.  Skin:  no ulcer on the feet or ankles.  normal temp on the feet and ankle, but there is hyperpigmentation and erythema of the legs.   Neuro: sensation is intact to touch on the feet and ankles, but decreased from normal Ext: There is bilateral onychomycosis of the toenails.    Lab Results  Component Value Date   HGBA1C 6.5 (A) 03/06/2019       Assessment & Plan:  HTN: is noted today Insulin-requiring type 2 DM, with CAD: overcontrolled, given this regimen, which does match insulin to his changing needs throughout the day. Renal insuff: he may need a faster-acting qd insulin, but we'll try increasing lantus first.  Edema: This limits rx options.   Patient Instructions  Your blood pressure is high today.  Please see your primary care provider soon, to have it rechecked Please reduce the lantus to 80 units each morning.   check your blood sugar twice a day.  vary the time of day when you check, between before the 3 meals, and at bedtime.  also check if you have symptoms of your blood sugar being too high or too low.  please keep a record of the readings and bring it to your next appointment here (or you can bring the meter itself).  You can write it on any piece of paper.   please call us sooner if your blood sugar goes below 70, or if you have a lot of readings over 200. Please have a follow-up appointment in 2 months.

## 2019-03-06 NOTE — Patient Instructions (Addendum)
Your blood pressure is high today.  Please see your primary care provider soon, to have it rechecked Please reduce the lantus to 80 units each morning.   check your blood sugar twice a day.  vary the time of day when you check, between before the 3 meals, and at bedtime.  also check if you have symptoms of your blood sugar being too high or too low.  please keep a record of the readings and bring it to your next appointment here (or you can bring the meter itself).  You can write it on any piece of paper.  please call us sooner if your blood sugar goes below 70, or if you have a lot of readings over 200. Please have a follow-up appointment in 2 months.

## 2019-03-13 ENCOUNTER — Telehealth: Payer: Self-pay | Admitting: Cardiovascular Disease

## 2019-03-13 DIAGNOSIS — N189 Chronic kidney disease, unspecified: Secondary | ICD-10-CM | POA: Diagnosis not present

## 2019-03-13 DIAGNOSIS — I2510 Atherosclerotic heart disease of native coronary artery without angina pectoris: Secondary | ICD-10-CM | POA: Diagnosis not present

## 2019-03-13 DIAGNOSIS — I480 Paroxysmal atrial fibrillation: Secondary | ICD-10-CM | POA: Diagnosis not present

## 2019-03-13 DIAGNOSIS — I5033 Acute on chronic diastolic (congestive) heart failure: Secondary | ICD-10-CM | POA: Diagnosis not present

## 2019-03-13 DIAGNOSIS — E1122 Type 2 diabetes mellitus with diabetic chronic kidney disease: Secondary | ICD-10-CM | POA: Diagnosis not present

## 2019-03-13 DIAGNOSIS — I130 Hypertensive heart and chronic kidney disease with heart failure and stage 1 through stage 4 chronic kidney disease, or unspecified chronic kidney disease: Secondary | ICD-10-CM | POA: Diagnosis not present

## 2019-03-13 NOTE — Telephone Encounter (Signed)
New Message    Inez Catalina is calling to report the Pts BP 180/70  She Says he is having SOB on exertion and swelling in left ankle and trace in his right   Please call

## 2019-03-13 NOTE — Telephone Encounter (Signed)
I spoke with Inez Catalina (home health nurse). She reports she saw pt today for first time in 2 weeks.  BP today was 180/70. Pt is taking all medications as listed. BP was also elevated when pt saw Dr. Loanne Drilling on 5/21--182/60.   Pt does not check BP at home but when last seen by home health it was 140/70 on 5/12 and 150/80 on April 28. Pt complains of some shortness of breath on exertion such as taking the trash up the driveway.  Swelling is minimal. Weight stable at 210 lbs.

## 2019-03-14 NOTE — Telephone Encounter (Signed)
I spoke with pt and confirmed he has received instructions from home health nurse. He is able to do video visit with Ermalinda Barrios as planned on June 3.  He has done this type of visit before.  Consent obtained.      Virtual Visit Pre-Appointment Phone Call  "(Name), I am calling you today to discuss your upcoming appointment. We are currently trying to limit exposure to the virus that causes COVID-19 by seeing patients at home rather than in the office."  1. "What is the BEST phone number to call the day of the visit?" - include this in appointment notes-[phone]  2. "Do you have or have access to (through a family member/friend) a smartphone with video capability that we can use for your visit?" yes a. If yes - list this number in appt notes as "cell" (if different from BEST phone #) and list the appointment type as a VIDEO visit in appointment notes b. If no - list the appointment type as a PHONE visit in appointment notes  3. Confirm consent - "In the setting of the current Covid19 crisis, you are scheduled for a video visit with your provider on June 3,2020.  Just as we do with many in-office visits, in order for you to participate in this visit, we must obtain consent.  If you'd like, I can send this to your mychart (if signed up) or email for you to review.  Otherwise, I can obtain your verbal consent now.  All virtual visits are billed to your insurance company just like a normal visit would be.  By agreeing to a virtual visit, we'd like you to understand that the technology does not allow for your provider to perform an examination, and thus may limit your provider's ability to fully assess your condition. If your provider identifies any concerns that need to be evaluated in person, we will make arrangements to do so.  Finally, though the technology is pretty good, we cannot assure that it will always work on either your or our end, and in the setting of a video visit, we may have to  convert it to a phone-only visit.  In either situation, we cannot ensure that we have a secure connection.  Are you willing to proceed?" STAFF: Did the patient verbally acknowledge consent to telehealth visit? Document YES/NO here: yes  4. Advise patient to be prepared - "Two hours prior to your appointment, go ahead and check your blood pressure, pulse, oxygen saturation, and your weight (if you have the equipment to check those) and write them all down. When your visit starts, your provider will ask you for this information. If you have an Apple Watch or Kardia device, please plan to have heart rate information ready on the day of your appointment. Please have a pen and paper handy nearby the day of the visit as well."  5. Give patient instructions for MyChart download to smartphone OR Doximity/Doxy.me as below if video visit (depending on what platform provider is using)  6. Inform patient they will receive a phone call 15 minutes prior to their appointment time (may be from unknown caller ID) so they should be prepared to answer    [facility] has been deemed a candidate for a follow-up tele-health visit to limit community exposure during the Covid-19 pandemic. I spoke with the patient via phone to ensure availability of phone/video source, confirm preferred email & phone number, and discuss instructions and expectations.  I reminded [place] to be prepared with any vital sign and/or heart rhythm information that could potentially be obtained via home monitoring, at the time of his visit. I reminded Maureen S Gasner to expect a phone call prior to his visit.  Leodis Liverpool, RN 03/14/2019 10:34 AM   INSTRUCTIONS FOR DOWNLOADING THE MYCHART APP TO SMARTPHONE  - The patient must first make sure to have activated MyChart and know their login information - If Apple, go to [employer] and type in MyChart in the search bar and download the app. If Android, ask  patient to go to [employer] and type in [place] in the search bar and download the app. The app is free but as with any other app downloads, their phone may require them to verify saved payment information or Apple/Android password.  - The patient will need to then log into the app with their MyChart username and password, and select [facility] as their healthcare provider to link the account. When it is time for your visit, go to the MyChart app, find appointments, and click Begin Video Visit. Be sure to Select Allow for your device to access the Microphone and Camera for your visit. You will then be connected, and your provider will be with you shortly.  **If they have any issues connecting, or need assistance please contact MyChart service desk [phone])**  **If using a computer, in order to ensure the best quality for their visit they will need to use either of the following Internet Browsers: [employer], or Google Chrome**  IF USING DOXIMITY or DOXY.ME - The patient will receive a link just prior to their visit by text.     FULL LENGTH CONSENT FOR TELE-HEALTH VISIT   I hereby voluntarily request, consent and authorize [facility] and its employed or contracted physicians, physician assistants, nurse practitioners or other licensed health care professionals (the Practitioner), to provide me with telemedicine health care services (the "Services") as deemed necessary by the treating Practitioner. I acknowledge and consent to receive the Services by the Practitioner via telemedicine. I understand that the telemedicine visit will involve communicating with the Practitioner through live audiovisual communication technology and the disclosure of certain medical information by electronic transmission. I acknowledge that I have been given the opportunity to request an in-person assessment or other available alternative prior to the telemedicine visit and am voluntarily  participating in the telemedicine visit.  I understand that I have the right to withhold or withdraw my consent to the use of telemedicine in the course of my care at any time, without affecting my right to future care or treatment, and that the Practitioner or I may terminate the telemedicine visit at any time. I understand that I have the right to inspect all information obtained and/or recorded in the course of the telemedicine visit and may receive copies of available information for a reasonable fee.  I understand that some of the potential risks of receiving the Services via telemedicine include:  Marland Kitchen Delay or interruption in medical evaluation due to technological equipment failure or disruption; . Information transmitted may not be sufficient (e.g. poor resolution of images) to allow for appropriate medical decision making by the Practitioner; and/or  . In rare instances, security protocols could fail, causing a breach of personal health information.  Furthermore, I acknowledge that it is my responsibility to provide information about my medical history, conditions and care that is complete and accurate to the best of  my ability. I acknowledge that Practitioner's advice, recommendations, and/or decision may be based on factors not within their control, such as incomplete or inaccurate data provided by me or distortions of diagnostic images or specimens that may result from electronic transmissions. I understand that the practice of medicine is not an exact science and that Practitioner makes no warranties or guarantees regarding treatment outcomes. I acknowledge that I will receive a copy of this consent concurrently upon execution via email to the email address I last provided but may also request a printed copy by calling the office of [facility].    I understand that my insurance will be billed for this visit.   I have read or had this consent read to me. . I understand the contents of this  consent, which adequately explains the benefits and risks of the Services being provided via telemedicine.  . I have been provided ample opportunity to ask questions regarding this consent and the Services and have had my questions answered to my satisfaction. . I give my informed consent for the services to be provided through the use of telemedicine in my medical care  By participating in this telemedicine visit I agree to the above.

## 2019-03-14 NOTE — Telephone Encounter (Signed)
I spoke with Jake Gross and gave her instructions from Dr. Angelena Form.  Telemedicine visit scheduled for June 3,2020 at 10:30 with Ermalinda Barrios, PA.  Jake Gross will call pt with medicine instructions. I will call pt later today with visit instructions.

## 2019-03-14 NOTE — Telephone Encounter (Signed)
I have not seen him before in the office. I performed his cath in February 2020. I have only met him in the cath lab. He was seen by Ermalinda Barrios in March 2020 and April 2020. I would have him increase Lasix to 80 mg daily for 3 days and plan telemedicine follow up with Ermalinda Barrios within next few days.  He will need to double his dose of Kdur while taking extra Lasix. Gerald Stabs

## 2019-03-18 NOTE — Progress Notes (Signed)
Virtual Visit via Video Note   This visit type was conducted due to national recommendations for restrictions regarding the COVID-19 Pandemic (e.g. social distancing) in an effort to limit this patient's exposure and mitigate transmission in our community.  Due to his co-morbid illnesses, this patient is at least at moderate risk for complications without adequate follow up.  This format is felt to be most appropriate for this patient at this time.  All issues noted in this document were discussed and addressed.  A limited physical exam was performed with this format.  Please refer to the patient's chart for his consent to telehealth for [facility].   Date:  03/19/2019   ID:  [name], DOB 11-29-1940, MRN [id number]  Patient Location: Home Provider Location: Home  PCP:  Jearld Fenton, NP  Cardiologist:  Lauree Chandler, MD  Electrophysiologist:  Thompson Grayer, MD   Evaluation Performed:  Follow-Up Visit  Chief Complaint:  Edema, short of breath  History of Present Illness:    Jake Gross is a 78 y.o. 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  CHA2DS2-VASc equals 7. patient was seen in the A. fib clinic 12/09/2018 and Plavix was stopped and Eliquis 5 mg twice daily started.  Aspirin was continued at 81 mg.  2D echo 11/21/2018 normal LVEF 60 to 65% mild dilation of the aortic root and ascending aorta.    I saw the patient 12/17/18 complaining of shortness of breath with walking 50 yards and swollen legs which was caused by amlodipine in the past. I decreased amlodipine to 5 mg daily and increased hydralazine to 50 mg TID, Increased lasix 80 mg for 3 days then back to 40 mg daily,compression stockings. No ACEI/ARB because of CKD. Weight 220 lbs that day.    LINQ repport 01/16/19 no arryhthmias.normal histogram.  I last had televisit  with patient 01/28/19 and weight down to 209 lbs and swelling down.   Patient called the office 03/14/2019 complaining of dyspnea on exertion and leg edema.  BP was also up at home health visit.  Weight was stable at 210 pounds.  Dr. Angelena Form increased his Lasix to 80 mg for 3 days and doubled up on his potassium and scheduled this visit.  Swelling has gone down and breathing is improved. Weight still 210 lbs. BP still up today. Has been getting extra salt in his diet although he doesn't think so.      The patient does not have symptoms concerning for COVID-19 infection (fever, chills, cough, or new shortness of breath).    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 NO ISCHEMIA   COLONOSCOPY W/ POLYPECTOMY     5 polyps   CORONARY ARTERY BYPASS GRAFT N/A 11/26/2018   Procedure: CORONARY ARTERY BYPASS GRAFTING (CABG), ON PUMP, TIMES FOUR, USING LEFT INTERNAL MAMMARY ARTERY AND ENDOSCOPICALLY HARVESTED LEFT SAPHENOUS VEIN;  Surgeon: Gaye Pollack, MD;  Location:  [facility] OR;  Service: Open Heart Surgery;  Laterality: N/A;   EUS N/A 03/11/2015   Procedure: LOWER ENDOSCOPIC ULTRASOUND (EUS);  Surgeon: Milus Banister, MD;  Location: [facility];  Service: Endoscopy;  Laterality: N/A;   FLEXIBLE SIGMOIDOSCOPY N/A 02/02/2015   Procedure: FLEXIBLE SIGMOIDOSCOPY;  Surgeon: Inda Castle, MD;  Location: [facility] ENDOSCOPY;  Service: Endoscopy;  Laterality: N/A;  ERBE   LEFT HEART CATH AND CORONARY ANGIOGRAPHY N/A 11/20/2018   Procedure: LEFT HEART CATH AND CORONARY ANGIOGRAPHY;  Surgeon: Burnell Blanks, MD;  Location: [facility] CV LAB;  Service: Cardiovascular;  Laterality: N/A;   LOOP RECORDER INSERTION N/A 08/14/2017   Procedure: LOOP RECORDER INSERTION;  Surgeon: Thompson Grayer, MD;  Location: [facility] CV LAB;  Service: Cardiovascular;  Laterality: N/A;   PARTIAL PROCTECTOMY BY TEM N/A 04/08/2015   Procedure: TEM PARTIAL PROCTECTOMY OF RECTAL MASS;  Surgeon: Michael Boston, MD;  Location: [facility] ORS;  Service: General;  Laterality: N/A;   TEE WITHOUT CARDIOVERSION N/A 06/25/2017   Procedure: TRANSESOPHAGEAL ECHOCARDIOGRAM (TEE);  Surgeon: Skeet Latch, MD;  Location: [facility];  Service: Cardiovascular;  Laterality: N/A;   TEE WITHOUT CARDIOVERSION N/A 11/26/2018   Procedure: TRANSESOPHAGEAL ECHOCARDIOGRAM (TEE);  Surgeon: Gaye Pollack, MD;  Location: [facility];  Service: Open Heart Surgery;  Laterality: N/A;   TONSILLECTOMY AND ADENOIDECTOMY     as child     Current Meds  Medication Sig   amLODipine (NORVASC) 5 MG tablet Take 1 tablet (5 mg total) by mouth daily.   apixaban (ELIQUIS) 5 MG TABS tablet Take 1 tablet (5 mg total) by mouth 2 (two) times daily.   aspirin 81 MG tablet Take 1 tablet (81 mg total) by mouth daily.   fenofibrate micronized (LOFIBRA) 67 MG capsule TAKE 1 CAPSULE DAILY BEFORE BREAKFAST   furosemide (LASIX) 40 MG tablet Take 40 mg by mouth daily.   hydrALAZINE (APRESOLINE) 50 MG tablet Take 1 tablet (50 mg total) by mouth 4 (four) times daily.   Insulin Glargine (LANTUS SOLOSTAR) 100 UNIT/ML Solostar Pen Inject 80 Units into the skin every morning.   metoprolol tartrate (LOPRESSOR) 25 MG tablet Take 1 tablet (25 mg total) by mouth 2 (two) times daily.   ONE TOUCH ULTRA TEST test strip USE 1 STRIP TWICE A DAY   ONETOUCH DELICA LANCETS 70W MISC    potassium chloride (K-DUR) 10 MEQ tablet Take 20 mEq by mouth daily.   rosuvastatin (CRESTOR) 20 MG tablet TAKE 1 TABLET BY MOUTH AT BEDTIME NEEDS APPT FOR REFILLS   [DISCONTINUED] hydrALAZINE (APRESOLINE) 50 MG tablet  Take 1 tablet (50 mg total) by mouth 3 (three) times daily.     Allergies:   Patient has no known allergies.   Social History   Tobacco Use   Smoking status: Former Smoker    Types: Cigarettes    Last attempt to quit: 08/13/1994    Years since quitting: 24.6   Smokeless tobacco: Never Used  Substance Use Topics   Alcohol use: No    Alcohol/week: 0.0 standard drinks   Drug use: No     Family Hx: The patient's family history includes Cancer in his brother and father; Coronary artery disease in his brother; Diabetes in his brother and mother; Stroke in his mother. There is no history of Colon cancer, Esophageal cancer, Rectal cancer, or Stomach cancer.  ROS:   Please see the history of present illness.      All other systems reviewed and are negative.  Prior CV studies:   The following studies were reviewed today:   Cath: 11/20/2018    Prox RCA to Mid RCA lesion is 30% stenosed.  RPDA lesion is 99% stenosed.  Post Atrio lesion is 100% stenosed.  Ost 2nd Diag to 2nd Diag lesion is 99% stenosed.  Prox LAD lesion is 95% stenosed.  Ost 1st Diag lesion is 80% stenosed.  Ost Cx to Prox Cx lesion is 70% stenosed.  Ost LM lesion is 70% stenosed.   1. Moderately severe ostial left main stenosis.  2. Severe stenosis in the heavily calcified mid LAD 3. Moderately severe proximal Circumflex stenosis 4. The RCA has mild calcific disease through the proximal and mid segments. The PDA is a moderate caliber vessel with severe stenosis. 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  5. Elevated LVEDP   Recommendations: He has severe left main and three vessel CAD. I will ask CT surgery to see him to discuss CABG. Given chronic kidney disease, I will admit him for hydration. Will hold Plavix. He will need Plavix washout prior to surgery. Will arrange echo tomorrow to assess LVEF. Will start ASA. Continue beta blocker and statin.  Given elevated filling pressures, one dose of IV Lasix tonight.    TTE: 11/21/2018 Echo 11/21/18 demonstrated  1. The left ventricle has normal systolic function of 27-74%. The cavity size was mildly increased. There is no increased left ventricular wall thickness. Echo evidence of impaired diastolic relaxation.  2. The right ventricle has normal systolic function. The cavity was normal. There is no increase in right ventricular wall thickness.  3. Right atrial size was mildly dilated.  4. The mitral valve is normal in structure. There is mild to moderate mitral annular calcification present. No evidence of mitral valve stenosis. No mitral regurgitation.  5. The tricuspid valve is normal in structure.  6. The aortic valve is tricuspid There is mild calcification of the aortic valve. No aortic stenosis.  7. There is mild dilatation of the aortic root and ascending aorta.  8. No evidence of left ventricular regional wall motion abnormalities.  9. No complete TR doppler jet so unable to estimate PA systolic pressure. LA 35mm       Labs/Other Tests and Data Reviewed:    EKG:  No ECG reviewed.  Recent Labs: 03/25/2018: ALT 18 11/27/2018: Magnesium 2.8 11/29/2018: Hemoglobin 10.5; Platelets 177 12/02/2018: BUN 29; Creatinine, Ser 1.50; Potassium 3.9; Sodium 142   Recent Lipid Panel Lab Results  Component Value Date/Time   CHOL 138 11/12/2018 03:28 PM   TRIG 214.0 (H) 11/12/2018 03:28 PM   HDL 25.60 (L) 11/12/2018 03:28 PM   CHOLHDL 5 11/12/2018 03:28 PM   LDLCALC 56 06/22/2017 05:16 AM   LDLDIRECT 80.0 11/12/2018 03:28 PM    Wt Readings from Last 3 Encounters:  03/19/19 210 lb (95.3 kg)  03/06/19 217 lb (98.4 kg)  01/28/19 209 lb (94.8 kg)     Objective:    Vital Signs:  BP (!) 168/70    Ht 5\' 7"  (1.702 m)    Wt 210 lb (95.3 kg)    BMI 32.89 kg/m    VITAL SIGNS:  reviewed GEN:  no acute distress RESPIRATORY:  normal respiratory effort, symmetric expansion CARDIOVASCULAR:  lower  extremity edema noted-trace edema  ASSESSMENT & PLAN:    1. CAD status post CABG times 42/11/20 for left main disease.  He is on aspirin and reduced beta-blocker because of bradycardia in the  hospital.  Plavix stopped because he is on Eliquis for atrial fibrillation.no angina 2. Postop atrial fibrillation found on ILR longest episode 12 hours started on Eliquis for CHA2DS2-VASc of 7-last LINQ report without Afib 3. Acute on Chronic diastolic CHF with recent edema and dyspnea now improved after 3 days of extra lasix.Will see if we can get heart healthy food to patient. 4. History of cryptogenic stroke in 2018 previously on Plavix and aspirin BiPAP leg stopped because of atrial fibrillation switch to Eliquis 5. Essential hypertension BP has been elevated. Increase hydralazine 50 mg qid 6. CKD no ACE inhibitor or ARB last Crt 1.57. will ask HH to draw labs.   COVID-19 Education: The signs and symptoms of COVID-19 were discussed with the patient and how to seek care for testing (follow up with PCP or arrange E-visit).   The importance of social distancing was discussed today.  Time:   Today, I have spent 13 minutes with the patient with telehealth technology discussing the above problems.     Medication Adjustments/Labs and Tests Ordered: Current medicines are reviewed at length with the patient today.  Concerns regarding medicines are outlined above.   Tests Ordered: No orders of the defined types were placed in this encounter.   Medication Changes: Meds ordered this encounter  Medications   hydrALAZINE (APRESOLINE) 50 MG tablet    Sig: Take 1 tablet (50 mg total) by mouth 4 (four) times daily.    Dispense:  360 tablet    Refill:  3    Disposition:  Follow up in 2 week(s) Ermalinda Barrios PA-C  Signed, Ermalinda Barrios, PA-C  03/19/2019 11:11 AM    [place]

## 2019-03-19 ENCOUNTER — Telehealth (INDEPENDENT_AMBULATORY_CARE_PROVIDER_SITE_OTHER): Payer: Medicare Other | Admitting: Physician Assistant

## 2019-03-19 ENCOUNTER — Other Ambulatory Visit: Payer: Self-pay

## 2019-03-19 ENCOUNTER — Encounter: Payer: Self-pay | Admitting: Physician Assistant

## 2019-03-19 VITALS — BP 168/70 | Ht 67.0 in | Wt 210.0 lb

## 2019-03-19 DIAGNOSIS — I2510 Atherosclerotic heart disease of native coronary artery without angina pectoris: Secondary | ICD-10-CM | POA: Diagnosis not present

## 2019-03-19 DIAGNOSIS — I10 Essential (primary) hypertension: Secondary | ICD-10-CM | POA: Diagnosis not present

## 2019-03-19 DIAGNOSIS — N183 Chronic kidney disease, stage 3 unspecified: Secondary | ICD-10-CM

## 2019-03-19 DIAGNOSIS — I480 Paroxysmal atrial fibrillation: Secondary | ICD-10-CM | POA: Diagnosis not present

## 2019-03-19 DIAGNOSIS — Z8673 Personal history of transient ischemic attack (TIA), and cerebral infarction without residual deficits: Secondary | ICD-10-CM

## 2019-03-19 DIAGNOSIS — I5033 Acute on chronic diastolic (congestive) heart failure: Secondary | ICD-10-CM | POA: Diagnosis not present

## 2019-03-19 MED ORDER — HYDRALAZINE HCL 50 MG PO TABS: 50.0000 mg | ORAL_TABLET | Freq: Four times a day (QID) | ORAL | 3 refills | Status: DC

## 2019-03-19 NOTE — Patient Instructions (Addendum)
Medication Instructions:  Your physician has recommended you make the following change in your medication:   INCREASE: hydralazine to 50 mg FOUR times a day.   Lab work: We will arrange for Home Health to come out and draw labs (BMET) in 1 week  If you have labs (blood work) drawn today and your tests are completely normal, you will receive your results only by: Marland Kitchen MyChart Message (if you have MyChart) OR . A paper copy in the mail If you have any lab test that is abnormal or we need to change your treatment, we will call you to review the results.  Testing/Procedures: None ordered  Follow-Up: . Follow up with Ermalinda Barrios, PA via VIDEO Visit in 2 weeks  Any Other Special Instructions Will Be Listed Below (If Applicable).

## 2019-03-20 ENCOUNTER — Telehealth: Payer: Self-pay | Admitting: Licensed Clinical Social Worker

## 2019-03-20 NOTE — Telephone Encounter (Signed)
CSW referred to assist patient with healthy food options through our CV food program. CSW discussed the program and provided information. CSW will mail application to patient to complete and return for enrollment in the program. Patient grateful for the resource. CSW continues to follow as needed. Raquel Sarna, [place], [place]

## 2019-03-24 ENCOUNTER — Ambulatory Visit (INDEPENDENT_AMBULATORY_CARE_PROVIDER_SITE_OTHER): Payer: Medicare Other | Admitting: *Deleted

## 2019-03-24 DIAGNOSIS — I639 Cerebral infarction, unspecified: Secondary | ICD-10-CM | POA: Diagnosis not present

## 2019-03-24 DIAGNOSIS — I480 Paroxysmal atrial fibrillation: Secondary | ICD-10-CM

## 2019-03-24 LAB — CUP PACEART REMOTE DEVICE CHECK
Date Time Interrogation Session: 20200607023601
Implantable Pulse Generator Implant Date: 20181030

## 2019-03-25 ENCOUNTER — Encounter: Payer: Self-pay | Admitting: Cardiovascular Disease

## 2019-03-25 DIAGNOSIS — I480 Paroxysmal atrial fibrillation: Secondary | ICD-10-CM | POA: Diagnosis not present

## 2019-03-25 DIAGNOSIS — I130 Hypertensive heart and chronic kidney disease with heart failure and stage 1 through stage 4 chronic kidney disease, or unspecified chronic kidney disease: Secondary | ICD-10-CM | POA: Diagnosis not present

## 2019-03-25 DIAGNOSIS — I2510 Atherosclerotic heart disease of native coronary artery without angina pectoris: Secondary | ICD-10-CM | POA: Diagnosis not present

## 2019-03-25 DIAGNOSIS — E1122 Type 2 diabetes mellitus with diabetic chronic kidney disease: Secondary | ICD-10-CM | POA: Diagnosis not present

## 2019-03-25 DIAGNOSIS — I5033 Acute on chronic diastolic (congestive) heart failure: Secondary | ICD-10-CM | POA: Diagnosis not present

## 2019-03-25 DIAGNOSIS — N189 Chronic kidney disease, unspecified: Secondary | ICD-10-CM | POA: Diagnosis not present

## 2019-03-27 ENCOUNTER — Other Ambulatory Visit: Payer: Self-pay | Admitting: Physician Assistant

## 2019-03-31 NOTE — Progress Notes (Signed)
Carelink Summary Report / Loop Recorder 

## 2019-04-01 ENCOUNTER — Telehealth: Payer: Self-pay | Admitting: Licensed Clinical Social Worker

## 2019-04-01 ENCOUNTER — Other Ambulatory Visit: Payer: Self-pay

## 2019-04-01 ENCOUNTER — Telehealth: Payer: Medicare Other | Admitting: Physician Assistant

## 2019-04-01 ENCOUNTER — Encounter: Payer: Self-pay | Admitting: Physician Assistant

## 2019-04-01 ENCOUNTER — Telehealth (INDEPENDENT_AMBULATORY_CARE_PROVIDER_SITE_OTHER): Payer: Medicare Other | Admitting: Physician Assistant

## 2019-04-01 VITALS — BP 171/65 | HR 57 | Ht 67.0 in | Wt 210.0 lb

## 2019-04-01 DIAGNOSIS — I5032 Chronic diastolic (congestive) heart failure: Secondary | ICD-10-CM

## 2019-04-01 DIAGNOSIS — N183 Chronic kidney disease, stage 3 unspecified: Secondary | ICD-10-CM

## 2019-04-01 DIAGNOSIS — I2510 Atherosclerotic heart disease of native coronary artery without angina pectoris: Secondary | ICD-10-CM | POA: Diagnosis not present

## 2019-04-01 DIAGNOSIS — I10 Essential (primary) hypertension: Secondary | ICD-10-CM

## 2019-04-01 DIAGNOSIS — I480 Paroxysmal atrial fibrillation: Secondary | ICD-10-CM

## 2019-04-01 DIAGNOSIS — Z8673 Personal history of transient ischemic attack (TIA), and cerebral infarction without residual deficits: Secondary | ICD-10-CM

## 2019-04-01 MED ORDER — HYDRALAZINE HCL 50 MG PO TABS: 50.0000 mg | ORAL_TABLET | Freq: Three times a day (TID) | ORAL | 3 refills | Status: DC

## 2019-04-01 MED ORDER — ISOSORBIDE MONONITRATE ER 30 MG PO TB24: 30.0000 mg | ORAL_TABLET | Freq: Every day | ORAL | 3 refills | Status: DC

## 2019-04-01 NOTE — Telephone Encounter (Signed)
CSW contacted patient to follow up on [employer] application. CSW informed patient that application submitted and delivery date of meals will be June 17th.  Patient grateful for the opportunity to continue with the CV Covid Food program for ongoing heart healthy meals. Patient informed this program is temporary although hopeful to support patient through the summer. CSW will continue to monitor and be available as needed.

## 2019-04-01 NOTE — Progress Notes (Signed)
Virtual Visit via Video Note   This visit type was conducted due to national recommendations for restrictions regarding the COVID-19 Pandemic (e.g. social distancing) in an effort to limit this patient's exposure and mitigate transmission in our community.  Due to his co-morbid illnesses, this patient is at least at moderate risk for complications without adequate follow up.  This format is felt to be most appropriate for this patient at this time.  All issues noted in this document were discussed and addressed.  A limited physical exam was performed with this format.  Please refer to the patient's chart for his consent to telehealth for [facility].   Date:  04/01/2019   ID:  [place], DOB 1940/12/29, MRN [id number]  Patient Location: Home Provider Location: Home  PCP:  Jearld Fenton, NP  Cardiologist:  Lauree Chandler, MD   Electrophysiologist:  Thompson Grayer, MD   Evaluation Performed:  Follow-Up Visit  Chief Complaint:  Edema, elevated BP  History of Present Illness:    Jake Gross is a 78 y.o. 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  CHA2DS2-VASc equals 7. patient was seen in the A. fib clinic 12/09/2018 and Plavix was stopped and Eliquis 5 mg twice daily started.  Aspirin was continued at 81 mg.  2D echo 11/21/2018 normal LVEF 60 to 65% mild dilation of the aortic root and ascending aorta.    I saw the patient 12/17/18 complaining of shortness of breath with walking 50 yards and swollen legs which was caused by amlodipine in the past. I decreased amlodipine to 5 mg daily and increased hydralazine to 50 mg TID, Increased lasix 80 mg for 3 days then back to 40 mg daily,compression stockings. No ACEI/ARB because of CKD. Weight 220 lbs that day.    LINQ repport 03/24/19 no arryhthmias.normal histogram.  Patient has been  treated for recurrent CHF/leg edema and elevated BP's since April. Was getting extra salt and I arranged heart healthy food delivery.  Patient says edema is down but still with dyspnea on exertion. [facility] told him his BP cuff is running 12 points higher than theirs. BP running 127/49-184/80.Watching his salt. Hasn't gotten meal delivery yet.     The patient does not have symptoms concerning for COVID-19 infection (fever, chills, cough, or new shortness of breath).    Past Medical History:  Diagnosis Date  . 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 Arthritis   . Cervical spondylosis   . Diabetes mellitus   . Hyperlipidemia   . Hypertension   . Stroke ([place]) 06/19/2017  . Vitamin D deficiency    Past Surgical History:  Procedure Laterality Date  . ABCESS DRAINAGE Left    buttocks  . CARDIOVASCULAR STRESS TEST  10/12/1999   EF 63%. NO ISCHEMIA  . COLONOSCOPY W/ POLYPECTOMY     5 polyps  . CORONARY ARTERY BYPASS GRAFT N/A 11/26/2018   Procedure: CORONARY ARTERY BYPASS GRAFTING (CABG), ON PUMP, TIMES FOUR, USING LEFT INTERNAL MAMMARY ARTERY AND ENDOSCOPICALLY HARVESTED LEFT SAPHENOUS VEIN;  Surgeon: Gaye Pollack, MD;  Location: [place];  Service: Open Heart Surgery;  Laterality: N/A;  . EUS N/A 03/11/2015   Procedure: LOWER ENDOSCOPIC ULTRASOUND (EUS);  Surgeon: Milus Banister, MD;  Location: [facility];  Service: Endoscopy;  Laterality: N/A;  .  FLEXIBLE SIGMOIDOSCOPY N/A 02/02/2015   Procedure: FLEXIBLE SIGMOIDOSCOPY;  Surgeon: Inda Castle, MD;  Location: [facility];  Service: Endoscopy;  Laterality: N/A;  ERBE  . LEFT HEART CATH AND CORONARY ANGIOGRAPHY N/A 11/20/2018   Procedure: LEFT HEART CATH AND CORONARY ANGIOGRAPHY;  Surgeon: Burnell Blanks, MD;  Location: [place] [facility];  Service: Cardiovascular;  Laterality: N/A;  . LOOP RECORDER INSERTION N/A 08/14/2017   Procedure: LOOP RECORDER INSERTION;  Surgeon:  Thompson Grayer, MD;  Location: [place] [facility];  Service: Cardiovascular;  Laterality: N/A;  . PARTIAL PROCTECTOMY BY TEM N/A 04/08/2015   Procedure: TEM PARTIAL PROCTECTOMY OF RECTAL MASS;  Surgeon: Michael Boston, MD;  Location: [facility];  Service: General;  Laterality: N/A;  . TEE WITHOUT CARDIOVERSION N/A 06/25/2017   Procedure: TRANSESOPHAGEAL ECHOCARDIOGRAM (TEE);  Surgeon: Skeet Latch, MD;  Location: [place];  Service: Cardiovascular;  Laterality: N/A;  . TEE WITHOUT CARDIOVERSION N/A 11/26/2018   Procedure: TRANSESOPHAGEAL ECHOCARDIOGRAM (TEE);  Surgeon: Gaye Pollack, MD;  Location: [facility];  Service: Open Heart Surgery;  Laterality: N/A;  . TONSILLECTOMY AND ADENOIDECTOMY     as child     Current Meds  Medication Sig  . amLODipine (NORVASC) 5 MG tablet Take 1 tablet (5 mg total) by mouth daily.  Marland Kitchen apixaban (ELIQUIS) 5 MG TABS tablet Take 1 tablet (5 mg total) by mouth 2 (two) times daily.  Marland Kitchen aspirin 81 MG tablet Take 1 tablet (81 mg total) by mouth daily.  . fenofibrate micronized (LOFIBRA) 67 MG capsule TAKE 1 CAPSULE DAILY BEFORE BREAKFAST  . furosemide (LASIX) 40 MG tablet Take 40 mg by mouth daily.  . hydrALAZINE (APRESOLINE) 50 MG tablet Take 1 tablet (50 mg total) by mouth 4 (four) times daily.  . Insulin Glargine (LANTUS SOLOSTAR) 100 UNIT/ML Solostar Pen Inject 80 Units into the skin every morning.  . metoprolol tartrate (LOPRESSOR) 25 MG tablet Take 1 tablet (25 mg total) by mouth 2 (two) times daily.  . ONE TOUCH ULTRA TEST test strip USE 1 STRIP TWICE A DAY  . ONETOUCH DELICA LANCETS 40J MISC   . potassium chloride (K-DUR) 10 MEQ tablet Take 20 mEq by mouth daily.  . rosuvastatin (CRESTOR) 20 MG tablet TAKE 1 TABLET BY MOUTH AT BEDTIME NEEDS APPT FOR REFILLS     Allergies:   Patient has no known allergies.   Social History   Tobacco Use  . Smoking status: Former Smoker    Types: Cigarettes    Quit date: 08/13/1994    Years since quitting: 24.6  .  Smokeless tobacco: Never Used  Substance Use Topics  . Alcohol use: No    Alcohol/week: 0.0 standard drinks  . Drug use: No     Family Hx: The patient's family history includes Cancer in his brother and father; Coronary artery disease in his brother; Diabetes in his brother and mother; Stroke in his mother. There is no history of Colon cancer, Esophageal cancer, Rectal cancer, or Stomach cancer.  ROS:   Please see the history of present illness.      All other systems reviewed and are negative.   Prior CV studies:   The following studies were reviewed today:    Cath: 11/20/2018    Prox RCA to Mid RCA lesion is 30% stenosed.  RPDA lesion is 99% stenosed.  Post Atrio lesion is 100% stenosed.  Ost 2nd Diag to 2nd Diag lesion is 99% stenosed.  Prox LAD lesion is 95% stenosed.  Colon Fl[place]  1st Diag lesion is 80% stenosed.  Ost Cx to Prox Cx lesion is 70% stenosed.  Ost LM lesion is 70% stenosed.   1. Moderately severe ostial left main stenosis.  2. Severe stenosis in the heavily calcified mid LAD 3. Moderately severe proximal Circumflex stenosis 4. The RCA has mild calcific disease through the proximal and mid segments. The PDA is a moderate caliber vessel with severe stenosis. 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  5. Elevated LVEDP   Recommendations: He has severe left main and three vessel CAD. I will ask CT surgery to see him to discuss CABG. Given chronic kidney disease, I will admit him for hydration. Will hold Plavix. He will need Plavix washout prior to surgery. Will arrange echo tomorrow to assess LVEF. Will start ASA. Continue beta blocker and statin. Given elevated filling pressures, one dose of IV Lasix tonight.    TTE: 11/21/2018 Echo 11/21/18 demonstrated  1. The left ventricle has normal systolic function of 24-23%. The cavity size was mildly increased. There is no increased left ventricular wall thickness. Echo  evidence of impaired diastolic relaxation.  2. The right ventricle has normal systolic function. The cavity was normal. There is no increase in right ventricular wall thickness.  3. Right atrial size was mildly dilated.  4. The mitral valve is normal in structure. There is mild to moderate mitral annular calcification present. No evidence of mitral valve stenosis. No mitral regurgitation.  5. The tricuspid valve is normal in structure.  6. The aortic valve is tricuspid There is mild calcification of the aortic valve. No aortic stenosis.  7. There is mild dilatation of the aortic root and ascending aorta.  8. No evidence of left ventricular regional wall motion abnormalities.  9. No complete TR doppler jet so unable to estimate PA systolic pressure. LA 65mm         Labs/Other Tests and Data Reviewed:    EKG:  No ECG reviewed.  Recent Labs: 11/27/2018: Magnesium 2.8 11/29/2018: Hemoglobin 10.5; Platelets 177 12/02/2018: BUN 29; Creatinine, Ser 1.50; Potassium 3.9; Sodium 142   Recent Lipid Panel Lab Results  Component Value Date/Time   CHOL 138 11/12/2018 03:28 PM   TRIG 214.0 (H) 11/12/2018 03:28 PM   HDL 25.60 (L) 11/12/2018 03:28 PM   CHOLHDL 5 11/12/2018 03:28 PM   LDLCALC 56 06/22/2017 05:16 AM   LDLDIRECT 80.0 11/12/2018 03:28 PM    Wt Readings from Last 3 Encounters:  04/01/19 210 lb (95.3 kg)  03/19/19 210 lb (95.3 kg)  03/06/19 217 lb (98.4 kg)     Objective:    Vital Signs:  BP (!) 171/65   Pulse (!) 57   Ht 5\' 7"  (1.702 m)   Wt 210 lb (95.3 kg)   BMI 32.89 kg/m    VITAL SIGNS:  reviewed GEN:  no acute distress RESPIRATORY:  normal respiratory effort, symmetric expansion CARDIOVASCULAR:  lower extremity edema noted trace  ASSESSMENT & PLAN:    1.  CAD status post CABG times 42/11/20 for left main disease.  He is on aspirin and reduced beta-blocker because of bradycardia in the hospital.  Plavix stopped because he is on Eliquis for atrial fibrillation.no  angina 2. Postop atrial fibrillation found on ILR longest episode 12 hours started on Eliquis- no recurrence on last check 03/24/19 3.  CHA2DS2-VASc of 7-last LINQ report without Afib 4.  Chronic diastolic CHF with recent edema and dyspnea now improved after Continue current dose  lasix.Will see if we can get heart healthy food to patient-hefilled out the form-to start tomorrow. 5. History of cryptogenic stroke in 2018 previously on Plavix and aspirin, Plavix stopped because of atrial fibrillation switched to Eliquis 6. Essential hypertension BP has been elevated-leg edema on higher dose amlodipine, need to reduce  hydralazine 50 mg tid because of GFR, no ACE/ARB with CKD. Will add Imdur 30 mg daily and try to get him a new BP cuff. F/u in 2-3 weeks  7. CKD no ACE inhibitor or ARB last Crt 1.78 on 6/9       COVID-19 Education: The signs and symptoms of COVID-19 were discussed with the patient and how to seek care for testing (follow up with PCP or arrange E-visit).   The importance of social distancing was discussed today.  Time:   Today, I have spent 11 minutes with the patient with telehealth technology discussing the above problems.     Medication Adjustments/Labs and Tests Ordered: Current medicines are reviewed at length with the patient today.  Concerns regarding medicines are outlined above.   Tests Ordered: No orders of the defined types were placed in this encounter.   Medication Changes: No orders of the defined types were placed in this encounter.   Follow Up:  Virtual Visit in 2 week(s) Ermalinda Barrios PA-C  Signed, Ermalinda Barrios, PA-C  04/01/2019 2:38 PM    [facility]

## 2019-04-01 NOTE — Telephone Encounter (Signed)
CSW referred to assist patient with obtaining a BP cuff. CSW contacted patient to inform cuff will be delivered to home next week. Patient grateful for support and assistance. CSW available as needed. Jackie Lennox Dolberry, LCSW, CCSW-MCS [phone]  

## 2019-04-01 NOTE — Patient Instructions (Addendum)
Medication Instructions:  Your physician has recommended you make the following change in your medication:   1. DECREASE: hydralazine to 50 mg three times a day  2. START: isosorbide mononitrate (imdur) 30 mg talblet once a day   Lab work: None Ordered  If you have labs (blood work) drawn today and your tests are completely normal, you will receive your results only by: Marland Kitchen MyChart Message (if you have MyChart) OR . A paper copy in the mail If you have any lab test that is abnormal or we need to change your treatment, we will call you to review the results.  Testing/Procedures: None ordered  Follow-Up: Follow up with Ermalinda Barrios, PA on 04/16/19 at 1:00 PM  Any Other Special Instructions Will Be Listed Below (If Applicable).

## 2019-04-16 ENCOUNTER — Telehealth: Payer: Self-pay

## 2019-04-16 ENCOUNTER — Telehealth (INDEPENDENT_AMBULATORY_CARE_PROVIDER_SITE_OTHER): Payer: Medicare Other | Admitting: Physician Assistant

## 2019-04-16 ENCOUNTER — Other Ambulatory Visit: Payer: Self-pay

## 2019-04-16 ENCOUNTER — Encounter: Payer: Self-pay | Admitting: Physician Assistant

## 2019-04-16 VITALS — BP 171/81 | HR 72 | Ht 67.0 in | Wt 210.0 lb

## 2019-04-16 DIAGNOSIS — I5033 Acute on chronic diastolic (congestive) heart failure: Secondary | ICD-10-CM | POA: Diagnosis not present

## 2019-04-16 DIAGNOSIS — I10 Essential (primary) hypertension: Secondary | ICD-10-CM

## 2019-04-16 DIAGNOSIS — N183 Chronic kidney disease, stage 3 unspecified: Secondary | ICD-10-CM

## 2019-04-16 DIAGNOSIS — I25810 Atherosclerosis of coronary artery bypass graft(s) without angina pectoris: Secondary | ICD-10-CM | POA: Diagnosis not present

## 2019-04-16 DIAGNOSIS — I480 Paroxysmal atrial fibrillation: Secondary | ICD-10-CM

## 2019-04-16 DIAGNOSIS — Z8673 Personal history of transient ischemic attack (TIA), and cerebral infarction without residual deficits: Secondary | ICD-10-CM | POA: Diagnosis not present

## 2019-04-16 DIAGNOSIS — I130 Hypertensive heart and chronic kidney disease with heart failure and stage 1 through stage 4 chronic kidney disease, or unspecified chronic kidney disease: Secondary | ICD-10-CM

## 2019-04-16 MED ORDER — HYDRALAZINE HCL 50 MG PO TABS: 75.0000 mg | ORAL_TABLET | Freq: Three times a day (TID) | ORAL | 3 refills | Status: DC

## 2019-04-16 NOTE — Telephone Encounter (Signed)
[facility] Visit Initial Request  Date of Request ([place]):  April 16, 2019  Requesting Provider:  Ermalinda Barrios, NP    Agency Requested:    [facility] Contact:  Glory Buff, NP [address] [place], [place] [address] Phone #:  [phone] Fax #:  [phone]  Patient Demographic Information: Name:  Jake Gross Age:  78 y.o.   DOB:  14-Apr-1941  MRN:  [id number]   Address:   [place] [address]   Phone Numbers:   Home Phone [phone]  Mobile [phone]     Emergency Contact Information on File:   Contact Information    Name Relation Home Work Mobile   [place] [phone]  [phone]      The above family members may be contacted for information on this patient (review DPR on file):  Yes    Patient Clinical Information:  Primary Care Provider:  Jearld Fenton, NP  Primary Cardiologist:  Lauree Chandler, MD  Primary Electrophysiologist:  Thompson Grayer, MD   Past Medical Hx: Jake Gross  has a past medical history of 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   Allergies: He has No Known Allergies.   Medications: Current Outpatient Medications on File Prior to Visit  Medication Sig  . amLODipine (NORVASC) 5 MG tablet Take 1 tablet (5 mg total) by mouth daily.  Marland Kitchen apixaban (ELIQUIS) 5 MG TABS tablet Take 1 tablet (5 mg total) by mouth 2 (two) times daily.  Marland Kitchen aspirin 81 MG tablet Take 1 tablet (81 mg total) by mouth daily.  . fenofibrate micronized (LOFIBRA) 67 MG capsule TAKE 1 CAPSULE DAILY BEFORE BREAKFAST  . furosemide (LASIX) 40 MG tablet Take 40 mg by mouth daily.  . hydrALAZINE (APRESOLINE) 50 MG tablet Take 1 tablet (50 mg total) by mouth 3 (three) times daily.  . Insulin Glargine (LANTUS SOLOSTAR) 100 UNIT/ML Solostar Pen Inject 80 Units  into the skin every morning.  . isosorbide mononitrate (IMDUR) 30 MG 24 hr tablet Take 1 tablet (30 mg total) by mouth daily.  . metoprolol tartrate (LOPRESSOR) 25 MG tablet Take 1 tablet (25 mg total) by mouth 2 (two) times daily.  . ONE TOUCH ULTRA TEST test strip USE 1 STRIP TWICE A DAY  . ONETOUCH DELICA LANCETS 81E MISC   . potassium chloride (K-DUR) 10 MEQ tablet Take 20 mEq by mouth daily.  . rosuvastatin (CRESTOR) 20 MG tablet TAKE 1 TABLET BY MOUTH AT BEDTIME NEEDS APPT FOR REFILLS   No current facility-administered medications on file prior to visit.      Social Hx: He  reports that he quit smoking about 24 years ago. His smoking use included cigarettes. He has never used smokeless tobacco. He reports that he does not drink alcohol or use drugs.    Diagnosis/Reason for Visit:   CHF, Hypertension, BMET  Services Requested:  Vital Signs (BP, Pulse, O2, Weight)  Physical Exam  Labs:  BMET  # of Visits Needed/Frequency per Week: Once

## 2019-04-16 NOTE — Patient Instructions (Addendum)
Medication Instructions:  Your physician has recommended you make the following change in your medication:   Increase Hydralazine to 75MG  1.5 tablets orally three times a day  If you need a refill on your cardiac medications before your next appointment, please call your pharmacy.   Lab work:  Home health will be reaching out to you for labs (BMET), assessment, and vital signs  Testing/Procedures:  None ordered today  Follow-Up:  05/14/19 at 1:00PM with Ermalinda Barrios, PA

## 2019-04-16 NOTE — Progress Notes (Signed)
Virtual Visit via Video Note   This visit type was conducted due to national recommendations for restrictions regarding the COVID-19 Pandemic (e.g. social distancing) in an effort to limit this patient's exposure and mitigate transmission in our community.  Due to his co-morbid illnesses, this patient is at least at moderate risk for complications without adequate follow up.  This format is felt to be most appropriate for this patient at this time.  All issues noted in this document were discussed and addressed.  A limited physical exam was performed with this format.  Please refer to the patient's chart for his consent to telehealth for [facility].   Date:  04/16/2019   ID:  [name], DOB 10-31-1940, MRN [id number]  Patient Location: Home Provider Location: Home  PCP:  Jearld Fenton, NP  Cardiologist:  Lauree Chandler, MD   Electrophysiologist:  Thompson Grayer, MD   Evaluation Performed:  Follow-Up Visit  Chief Complaint:  f/u  History of Present Illness:    Jake Gross is a 78 y.o. 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  CHA2DS2-VASc equals 7. patient was seen in the A. fib clinic 12/09/2018 and Plavix was stopped and Eliquis 5 mg twice daily started.  Aspirin was continued at 81 mg.  2D echo 11/21/2018 normal LVEF 60 to 65% mild dilation of the aortic root and ascending aorta.    I have treated the patient with CHF since 12/3018-BP's up and heart healthy meal delivery ordered.  Says he is feeling better than ever. Eating better since home food delivery. Got a new BP cuff and BP running high over the past 2 weeks. Taking all his meds. BP 137/59-184/76. Swelling has been down but today right ankle swelling. Some DOE but better.   The patient does not have symptoms concerning for COVID-19 infection (fever, chills, cough, or  new shortness of breath).    Past Medical History:  Diagnosis Date  . 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 Arthritis   . Cervical spondylosis   . Diabetes mellitus   . Hyperlipidemia   . Hypertension   . Stroke ([place]) 06/19/2017  . Vitamin D deficiency    Past Surgical History:  Procedure Laterality Date  . ABCESS DRAINAGE Left    buttocks  . CARDIOVASCULAR STRESS TEST  10/12/1999   EF 63%. NO ISCHEMIA  . COLONOSCOPY W/ POLYPECTOMY     5 polyps  . CORONARY ARTERY BYPASS GRAFT N/A 11/26/2018   Procedure: CORONARY ARTERY BYPASS GRAFTING (CABG), ON PUMP, TIMES FOUR, USING LEFT INTERNAL MAMMARY ARTERY AND ENDOSCOPICALLY HARVESTED LEFT SAPHENOUS VEIN;  Surgeon: Gaye Pollack, MD;  Location: [place];  Service: Open Heart Surgery;  Laterality: N/A;  . EUS N/A 03/11/2015   Procedure: LOWER ENDOSCOPIC ULTRASOUND (EUS);  Surgeon: Milus Banister, MD;  Location[facility];  Service: Endoscopy;  Laterality: N/A;  . FLEXIBLE SIGMOIDOSCOPY N/A 02/02/2015   Procedure: FLEXIBLE SIGMOIDOSCOPY;  Surgeon: Inda Castle, MD;  Location: [facility];  Service: Endoscopy;  Laterality: N/A;  ERBE  . LEFT HEART CATH AND CORONARY ANGIOGRAPHY N/A 11/20/2018   Procedure: LEFT HEART CATH AND CORONARY ANGIOGRAPHY;  Surgeon: Burnell Blanks, MD;  Location: [place] [facility];  Service: Cardiovascular;  Laterality: N/A;  . LOOP RECORDER INSERTION N/A 08/14/2017   Procedure:  LOOP RECORDER INSERTION;  Surgeon: Thompson Grayer, MD;  Location: [place] [facility];  Service: Cardiovascular;  Laterality: N/A;  . PARTIAL PROCTECTOMY BY TEM N/A 04/08/2015   Procedure: TEM PARTIAL PROCTECTOMY OF RECTAL MASS;  Surgeon: Michael Boston, MD;  Location: [facility];  Service: General;  Laterality: N/A;  . TEE WITHOUT CARDIOVERSION N/A 06/25/2017   Procedure: TRANSESOPHAGEAL ECHOCARDIOGRAM (TEE);  Surgeon: Skeet Latch, MD;  Location: [place];  Service: Cardiovascular;   Laterality: N/A;  . TEE WITHOUT CARDIOVERSION N/A 11/26/2018   Procedure: TRANSESOPHAGEAL ECHOCARDIOGRAM (TEE);  Surgeon: Gaye Pollack, MD;  Location: [place];  Service: Open Heart Surgery;  Laterality: N/A;  . TONSILLECTOMY AND ADENOIDECTOMY     as child     Current Meds  Medication Sig  . amLODipine (NORVASC) 5 MG tablet Take 1 tablet (5 mg total) by mouth daily.  Marland Kitchen apixaban (ELIQUIS) 5 MG TABS tablet Take 1 tablet (5 mg total) by mouth 2 (two) times daily.  Marland Kitchen aspirin 81 MG tablet Take 1 tablet (81 mg total) by mouth daily.  . fenofibrate micronized (LOFIBRA) 67 MG capsule TAKE 1 CAPSULE DAILY BEFORE BREAKFAST  . furosemide (LASIX) 40 MG tablet Take 40 mg by mouth daily.  . Insulin Glargine (LANTUS SOLOSTAR) 100 UNIT/ML Solostar Pen Inject 80 Units into the skin every morning.  . isosorbide mononitrate (IMDUR) 30 MG 24 hr tablet Take 1 tablet (30 mg total) by mouth daily.  . metoprolol tartrate (LOPRESSOR) 25 MG tablet Take 1 tablet (25 mg total) by mouth 2 (two) times daily.  . ONE TOUCH ULTRA TEST test strip USE 1 STRIP TWICE A DAY  . ONETOUCH DELICA LANCETS 28U MISC   . potassium chloride (K-DUR) 10 MEQ tablet Take 20 mEq by mouth daily.  . rosuvastatin (CRESTOR) 20 MG tablet TAKE 1 TABLET BY MOUTH AT BEDTIME NEEDS APPT FOR REFILLS  . [DISCONTINUED] hydrALAZINE (APRESOLINE) 50 MG tablet Take 1 tablet (50 mg total) by mouth 3 (three) times daily.     Allergies:   Patient has no known allergies.   Social History   Tobacco Use  . Smoking status: Former Smoker    Types: Cigarettes    Quit date: 08/13/1994    Years since quitting: 24.6  . Smokeless tobacco: Never Used  Substance Use Topics  . Alcohol use: No    Alcohol/week: 0.0 standard drinks  . Drug use: No     Family Hx: The patient's family history includes Cancer in his brother and father; Coronary artery disease in his brother; Diabetes in his brother and mother; Stroke in his mother. There is no history of Colon  cancer, Esophageal cancer, Rectal cancer, or Stomach cancer.  ROS:   Please see the history of present illness.      All other systems reviewed and are negative.   Prior CV studies:   The following studies were reviewed today:    Cath: 11/20/2018    Prox RCA to Mid RCA lesion is 30% stenosed.  RPDA lesion is 99% stenosed.  Post Atrio lesion is 100% stenosed.  Ost 2nd Diag to 2nd Diag lesion is 99% stenosed.  Prox LAD lesion is 95% stenosed.  Ost 1st Diag lesion is 80% stenosed.  Ost Cx to Prox Cx lesion is 70% stenosed.  Ost LM lesion is 70% stenosed.   1. Moderately severe ostial left main stenosis.  2. Severe stenosis in the heavily calcified mid LAD 3. Moderately severe proximal Circumflex stenosis 4. The RCA has mild calcific  disease through the proximal and mid segments. The PDA is a moderate caliber vessel with severe stenosis. 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  5. Elevated LVEDP   Recommendations: He has severe left main and three vessel CAD. I will ask CT surgery to see him to discuss CABG. Given chronic kidney disease, I will admit him for hydration. Will hold Plavix. He will need Plavix washout prior to surgery. Will arrange echo tomorrow to assess LVEF. Will start ASA. Continue beta blocker and statin. Given elevated filling pressures, one dose of IV Lasix tonight.    TTE: 11/21/2018 Echo 11/21/18 demonstrated  1. The left ventricle has normal systolic function of 27-74%. The cavity size was mildly increased. There is no increased left ventricular wall thickness. Echo evidence of impaired diastolic relaxation.  2. The right ventricle has normal systolic function. The cavity was normal. There is no increase in right ventricular wall thickness.  3. Right atrial size was mildly dilated.  4. The mitral valve is normal in structure. There is mild to moderate mitral annular calcification present. No evidence of mitral  valve stenosis. No mitral regurgitation.  5. The tricuspid valve is normal in structure.  6. The aortic valve is tricuspid There is mild calcification of the aortic valve. No aortic stenosis.  7. There is mild dilatation of the aortic root and ascending aorta.  8. No evidence of left ventricular regional wall motion abnormalities.  9. No complete TR doppler jet so unable to estimate PA systolic pressure. LA 37mm         Labs/Other Tests and Data Reviewed:    EKG:  LINQ 03/23/19 no arrhythmias.   Recent Labs: 11/27/2018: Magnesium 2.8 11/29/2018: Hemoglobin 10.5; Platelets 177 12/02/2018: BUN 29; Creatinine, Ser 1.50; Potassium 3.9; Sodium 142   Recent Lipid Panel Lab Results  Component Value Date/Time   CHOL 138 11/12/2018 03:28 PM   TRIG 214.0 (H) 11/12/2018 03:28 PM   HDL 25.60 (L) 11/12/2018 03:28 PM   CHOLHDL 5 11/12/2018 03:28 PM   LDLCALC 56 06/22/2017 05:16 AM   LDLDIRECT 80.0 11/12/2018 03:28 PM    Wt Readings from Last 3 Encounters:  04/16/19 210 lb (95.3 kg)  04/01/19 210 lb (95.3 kg)  03/19/19 210 lb (95.3 kg)     Objective:    Vital Signs:  BP (!) 171/81   Pulse 72   Ht 5\' 7"  (1.702 m)   Wt 210 lb (95.3 kg)   BMI 32.89 kg/m    VITAL SIGNS:  reviewed GEN:  no acute distress RESPIRATORY:  normal respiratory effort, symmetric expansion CARDIOVASCULAR:  lower extremity edema noted  ASSESSMENT & PLAN:    1. CAD status post CABG times 4-2/11/20 for left main disease.  He is on aspirin and reduced beta-blocker because of bradycardia in the hospital.  Plavix stopped because he is on Eliquis for atrial fibrillation.no angina 2. Postop atrial fibrillation found on ILR longest episode 12 hours started on Eliquis for CHA2DS2-VASc of 7-last LINQ report without Afib 3. Acute on Chronic diastolic CHF with recent edema and dyspnea Very appreciative of heart healthy food sent to patient. Can take extra lasix prn for edema, weight gain-repeat bmet next week and HH 4.  History of cryptogenic stroke in 2018 previously on Plavix and aspirin BiPAP leg stopped because of atrial fibrillation switch to Eliquis 5. Essential hypertension BP has been elevated. Increase hydralazine 75 mg qid 6. CKD no ACE inhibitor or ARB last Crt 1.57.  will ask HH to draw labs.      COVID-19 Education: The signs and symptoms of COVID-19 were discussed with the patient and how to seek care for testing (follow up with PCP or arrange E-visit).  The importance of social distancing was discussed today.  Time:   Today, I have spent 10 minutes with the patient with telehealth technology discussing the above problems.     Medication Adjustments/Labs and Tests Ordered: Current medicines are reviewed at length with the patient today.  Concerns regarding medicines are outlined above.   Tests Ordered: No orders of the defined types were placed in this encounter.   Medication Changes: Meds ordered this encounter  Medications  . DISCONTD: hydrALAZINE (APRESOLINE) 50 MG tablet    Sig: Take 1.5 tablets (75 mg total) by mouth 3 (three) times daily.    Dispense:  405 tablet    Refill:  3    Patient will call for refills  . hydrALAZINE (APRESOLINE) 50 MG tablet    Sig: Take 1.5 tablets (75 mg total) by mouth 3 (three) times daily.    Dispense:  405 tablet    Refill:  3    Patient will call for refills    Follow Up:  Virtual Visit in 1 month(s) Ermalinda Barrios PA-C  Signed, Ermalinda Barrios, PA-C  04/16/2019 2:08 PM    [facility]

## 2019-04-22 DIAGNOSIS — I10 Essential (primary) hypertension: Secondary | ICD-10-CM | POA: Diagnosis not present

## 2019-04-23 LAB — BASIC METABOLIC PANEL
BUN/Creatinine Ratio: 18 (ref 10–24)
BUN: 29 mg/dL — ABNORMAL HIGH (ref 8–27)
CO2: 20 mmol/L (ref 20–29)
Calcium: 8.9 mg/dL (ref 8.6–10.2)
Chloride: 111 mmol/L — ABNORMAL HIGH (ref 96–106)
Creatinine, Ser: 1.62 mg/dL — ABNORMAL HIGH (ref 0.76–1.27)
GFR calc Af Amer: 47 mL/min/{1.73_m2} — ABNORMAL LOW (ref >59)
GFR calc non Af Amer: 41 mL/min/{1.73_m2} — ABNORMAL LOW (ref >59)
Glucose: 102 mg/dL — ABNORMAL HIGH (ref 65–99)
Potassium: 5.1 mmol/L (ref 3.5–5.2)
Sodium: 143 mmol/L (ref 134–144)

## 2019-04-23 NOTE — Progress Notes (Signed)
Jake Gross, his BP consistently runs high and Glory Buff said his med box was empty and he needs help with this. Can we ask his pharmacist to package his meds or have Sharyn Lull arrange it? I don't want to change things if he's missing doses. Thanks!

## 2019-04-24 ENCOUNTER — Ambulatory Visit (INDEPENDENT_AMBULATORY_CARE_PROVIDER_SITE_OTHER): Payer: Medicare Other | Admitting: *Deleted

## 2019-04-24 DIAGNOSIS — I480 Paroxysmal atrial fibrillation: Secondary | ICD-10-CM

## 2019-04-25 ENCOUNTER — Other Ambulatory Visit: Payer: Self-pay | Admitting: Internal Medicine

## 2019-04-25 LAB — CUP PACEART REMOTE DEVICE CHECK
Date Time Interrogation Session: 20200710024125
Implantable Pulse Generator Implant Date: 20181030

## 2019-04-30 NOTE — Progress Notes (Signed)
Carelink Summary Report / Loop Recorder 

## 2019-05-05 IMAGING — DX CHEST - 2 VIEW
2 series · 2 of 2 positions shown · non-contrast
Comparison: 11/29/2018

CLINICAL DATA: 78-year-old with post CABG.

EXAM:
CHEST - 2 VIEW

[dg chest 2 view (1 of 2)]
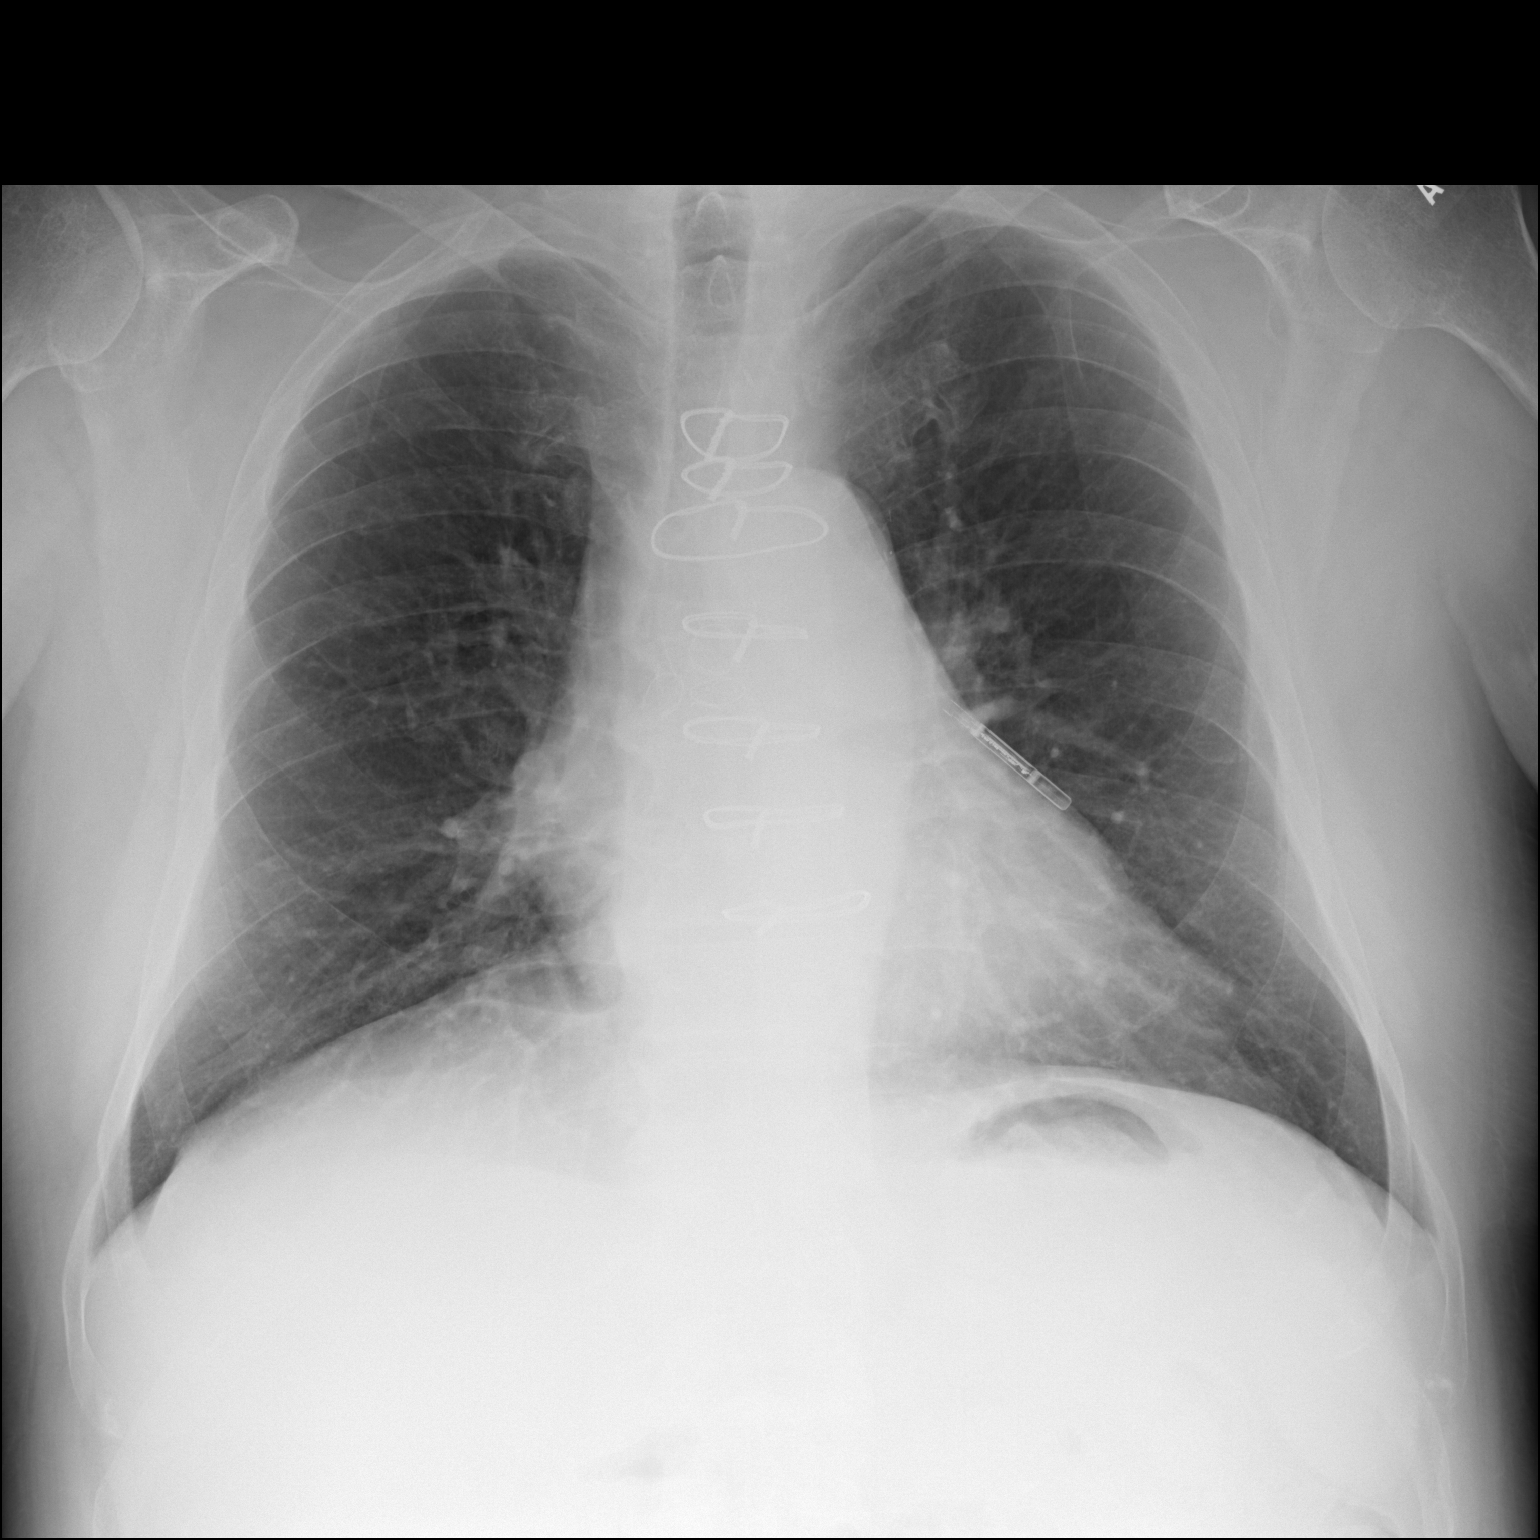

[dg chest 2 view (2 of 2)]
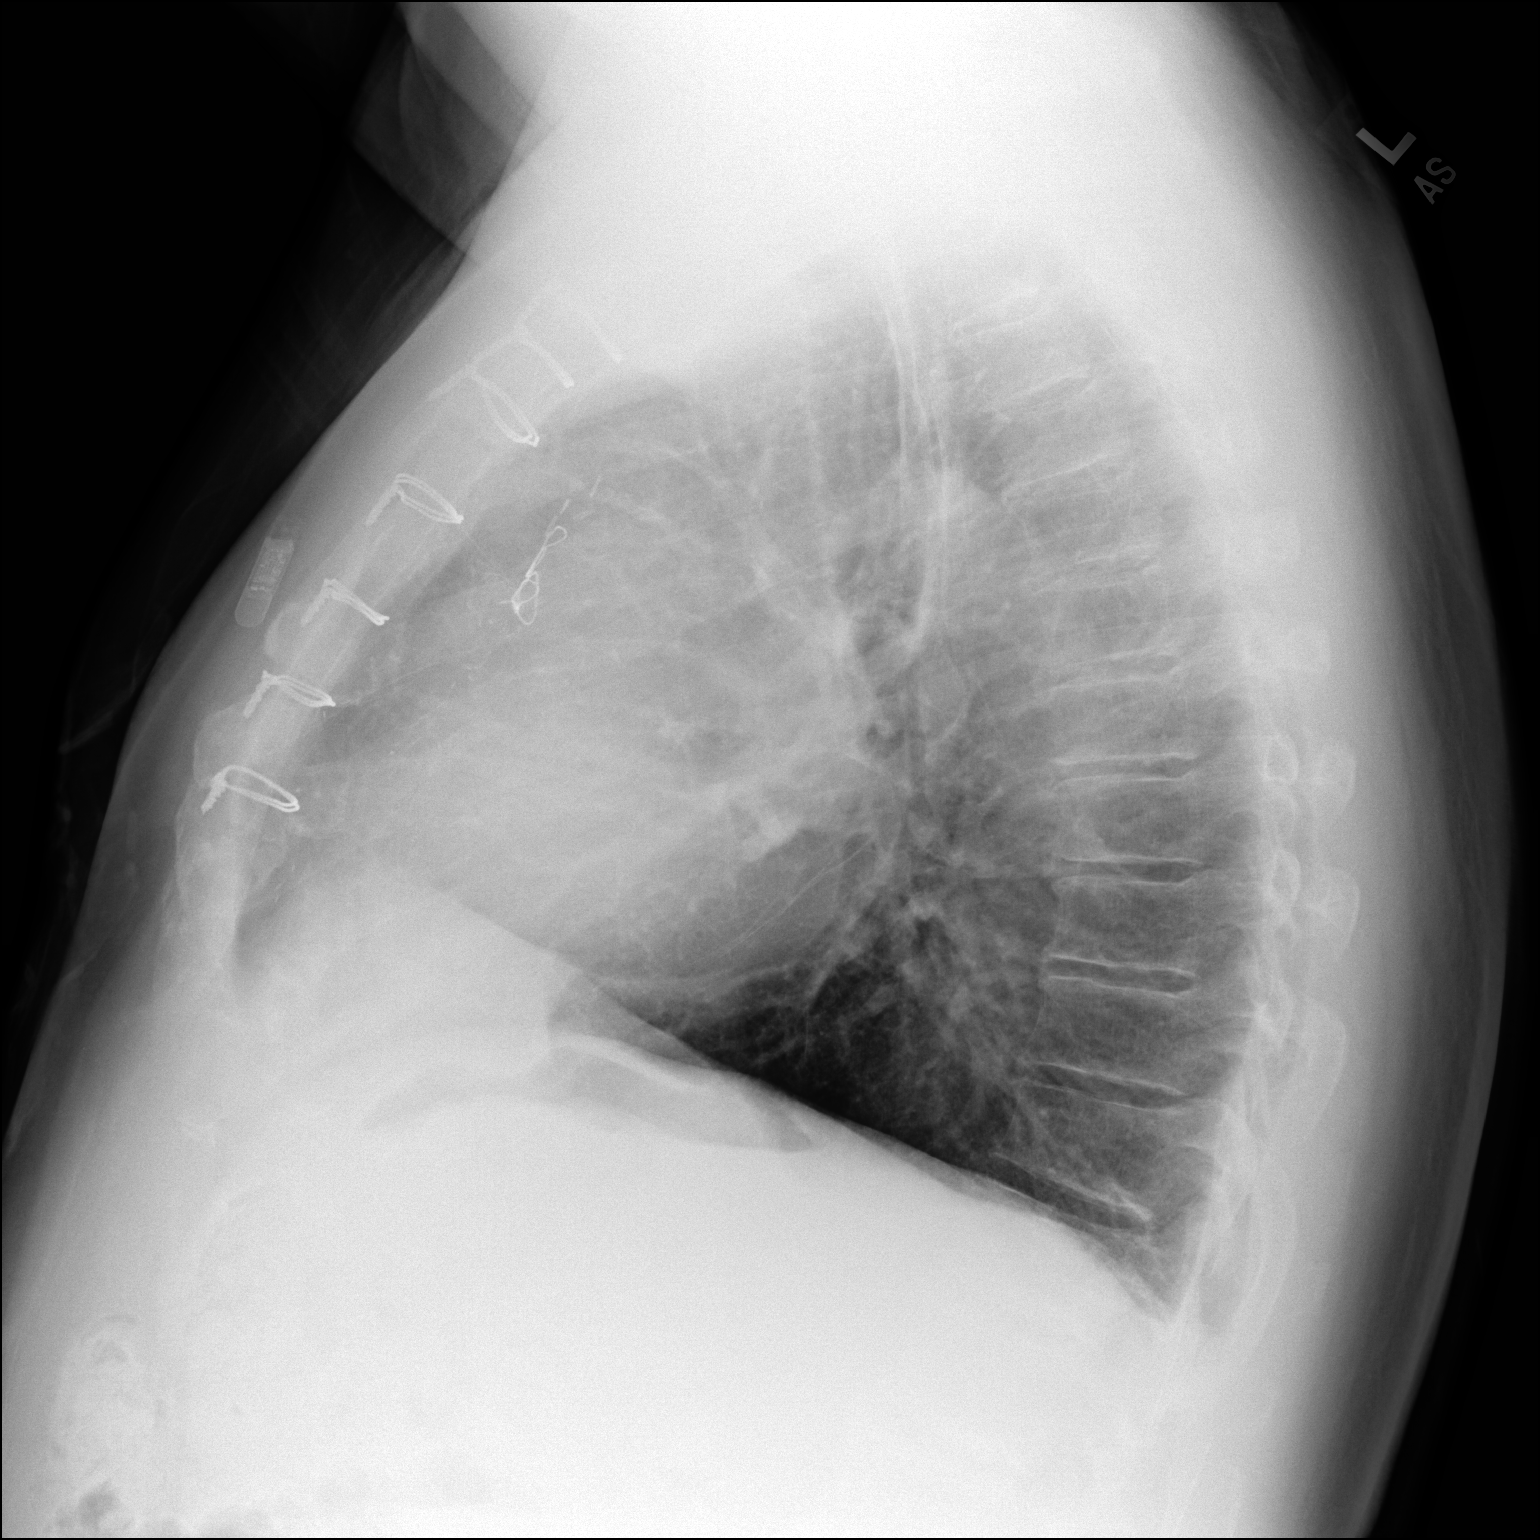

[2 of 2 positions shown; findings below may reference images not displayed]

FINDINGS: Heart size is stable with post CABG changes. Cardiac loop recorder
is present. The lungs are clear without airspace disease or
pulmonary edema. Negative for a pneumothorax. Pleural fluid has
resolved from the prior chest radiograph.
IMPRESSION: No active cardiopulmonary disease.

## 2019-05-07 ENCOUNTER — Other Ambulatory Visit: Payer: Self-pay

## 2019-05-09 ENCOUNTER — Ambulatory Visit (INDEPENDENT_AMBULATORY_CARE_PROVIDER_SITE_OTHER): Payer: Medicare Other | Admitting: Endocrinology

## 2019-05-09 ENCOUNTER — Encounter: Payer: Self-pay | Admitting: Endocrinology

## 2019-05-09 ENCOUNTER — Other Ambulatory Visit: Payer: Self-pay

## 2019-05-09 VITALS — BP 144/50 | HR 73 | Ht 67.0 in | Wt 217.4 lb

## 2019-05-09 DIAGNOSIS — Z794 Long term (current) use of insulin: Secondary | ICD-10-CM | POA: Diagnosis not present

## 2019-05-09 DIAGNOSIS — R609 Edema, unspecified: Secondary | ICD-10-CM | POA: Diagnosis not present

## 2019-05-09 DIAGNOSIS — E1129 Type 2 diabetes mellitus with other diabetic kidney complication: Secondary | ICD-10-CM | POA: Diagnosis not present

## 2019-05-09 DIAGNOSIS — E119 Type 2 diabetes mellitus without complications: Secondary | ICD-10-CM

## 2019-05-09 LAB — POCT GLYCOSYLATED HEMOGLOBIN (HGB A1C): Hemoglobin A1C: 6.3 % — AB (ref 4.0–5.6)

## 2019-05-09 MED ORDER — LANTUS SOLOSTAR 100 UNIT/ML ~~LOC~~ SOPN: 70.0000 [IU] | PEN_INJECTOR | SUBCUTANEOUS | 11 refills | Status: DC

## 2019-05-09 NOTE — Patient Instructions (Addendum)
Your blood pressure is high today.  Please see your primary care provider soon, to have it rechecked.   Please reduce the lantus to 70 units each morning.   check your blood sugar twice a day.  vary the time of day when you check, between before the 3 meals, and at bedtime.  also check if you have symptoms of your blood sugar being too high or too low.  please keep a record of the readings and bring it to your next appointment here (or you can bring the meter itself).  You can write it on any piece of paper.  please call us sooner if your blood sugar goes below 70, or if you have a lot of readings over 200. Please have a follow-up appointment in 2 months.

## 2019-05-09 NOTE — Progress Notes (Signed)
Subjective:    Patient ID: [employer], male    DOB: 21-Sep-1941, 78 y.o.   MRN: [id number]  HPI Pt returns for f/u of diabetes mellitus:  DM type: Insulin-requiring type 2 DM.   Dx'ed: 2952 Complications: polyneuropathy, retinopathy, CVA, CAD, and renal insufficiency.  Therapy: insulin since 2005 DKA: never Severe hypoglycemia: last episode was in 2014.   Pancreatitis: never.  Pancreatic imaging: normal CT in 2006 Other: he eats 2 meals per day; he takes QD insulin, after poor results with multiple daily injections.   Interval history: He now takes lantus, 80 units qd.  He says cbg varies from 126-160.  pt states he feels well in general.   Past Medical History:  Diagnosis Date  . 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 Arthritis   . Cervical spondylosis   . Diabetes mellitus   . Hyperlipidemia   . Hypertension   . Stroke ([place]) 06/19/2017  . Vitamin D deficiency     Past Surgical History:  Procedure Laterality Date  . ABCESS DRAINAGE Left    buttocks  . CARDIOVASCULAR STRESS TEST  10/12/1999   EF 63%. NO ISCHEMIA  . COLONOSCOPY W/ POLYPECTOMY     5 polyps  . CORONARY ARTERY BYPASS GRAFT N/A 11/26/2018   Procedure: CORONARY ARTERY BYPASS GRAFTING (CABG), ON PUMP, TIMES FOUR, USING LEFT INTERNAL MAMMARY ARTERY AND ENDOSCOPICALLY HARVESTED LEFT SAPHENOUS VEIN;  Surgeon: Gaye Pollack, MD;  Location: [place];  Service: Open Heart Surgery;  Laterality: N/A;  . EUS N/A 03/11/2015   Procedure: LOWER ENDOSCOPIC ULTRASOUND (EUS);  Surgeon: Milus Banister, MD;  Location: [facility];  Service: Endoscopy;  Laterality: N/A;  . FLEXIBLE SIGMOIDOSCOPY N/A 02/02/2015   Procedure: FLEXIBLE SIGMOIDOSCOPY;  Surgeon: Inda Castle, MD;  [facility];  Service: Endoscopy;  Laterality: N/A;  ERBE  . LEFT HEART CATH AND CORONARY ANGIOGRAPHY N/A 11/20/2018   Procedure: LEFT HEART CATH AND CORONARY ANGIOGRAPHY;  Surgeon: Burnell Blanks, MD;  Location: [place] [facility];  Service: Cardiovascular;  Laterality: N/A;  . LOOP RECORDER INSERTION N/A 08/14/2017   Procedure: LOOP RECORDER INSERTION;  Surgeon: Thompson Grayer, MD;  Location: [place] [facility];  Service: Cardiovascular;  Laterality: N/A;  . PARTIAL PROCTECTOMY BY TEM N/A 04/08/2015   Procedure: TEM PARTIAL PROCTECTOMY OF RECTAL MASS;  Surgeon: Michael Boston, MD;  [facility];  Service: General;  Laterality: N/A;  . TEE WITHOUT CARDIOVERSION N/A 06/25/2017   Procedure: TRANSESOPHAGEAL ECHOCARDIOGRAM (TEE);  Surgeon: Skeet Latch, MD;  Location: [place];  Service: Cardiovascular;  Laterality: N/A;  . TEE WITHOUT CARDIOVERSION N/A 11/26/2018   Procedure: TRANSESOPHAGEAL ECHOCARDIOGRAM (TEE);  Surgeon: Gaye Pollack, MD;  Location: [place];  Service: Open Heart Surgery;  Laterality: N/A;  . TONSILLECTOMY AND ADENOIDECTOMY     as child    Social History   Socioeconomic History  . Marital status: Divorced    Spouse name: Not on file  . Number of children: 2  . Years of education: Not on file  . Highest education level: Not on file  Occupational History  . Occupation: retired    Fish farm manager: [employer]  Social Needs  . Financial resource strain: Not on file  . Food insecurity    Worry: Not on file    Inability: Not on file  . Transportation needs    Medical: Not on file    Non-medical: Not on file  Tobacco  Use  . Smoking status: Former Smoker    Types: Cigarettes    Quit date: 08/13/1994    Years since quitting: 24.7  . Smokeless tobacco: Never Used  Substance and Sexual Activity  . Alcohol use: No    Alcohol/week: 0.0 standard drinks  . Drug use: No  . Sexual activity: Not Currently  Lifestyle  . Physical activity    Days per week: Not on file    Minutes per session: Not on file  . Stress: Not on file  Relationships  . Social Herbalist on phone: Not on file    Gets together: Not on file    Attends religious service: Not  on file    Active member of club or organization: Not on file    Attends meetings of clubs or organizations: Not on file    Relationship status: Not on file  . Intimate partner violence    Fear of current or ex partner: Not on file    Emotionally abused: Not on file    Physically abused: Not on file    Forced sexual activity: Not on file  Other Topics Concern  . Not on file  Social History Narrative  . Not on file    Current Outpatient Medications on File Prior to Visit  Medication Sig Dispense Refill  . amLODipine (NORVASC) 5 MG tablet Take 1 tablet (5 mg total) by mouth daily. 90 tablet 3  . apixaban (ELIQUIS) 5 MG TABS tablet Take 1 tablet (5 mg total) by mouth 2 (two) times daily. 60 tablet 6  . aspirin 81 MG tablet Take 1 tablet (81 mg total) by mouth daily.    . fenofibrate micronized (LOFIBRA) 67 MG capsule TAKE 1 CAPSULE DAILY BEFORE BREAKFAST 90 capsule 1  . furosemide (LASIX) 40 MG tablet Take 40 mg by mouth daily.    . hydrALAZINE (APRESOLINE) 50 MG tablet Take 1.5 tablets (75 mg total) by mouth 3 (three) times daily. 405 tablet 3  . isosorbide mononitrate (IMDUR) 30 MG 24 hr tablet Take 1 tablet (30 mg total) by mouth daily. 90 tablet 3  . metoprolol tartrate (LOPRESSOR) 25 MG tablet Take 1 tablet (25 mg total) by mouth 2 (two) times daily. 60 tablet 3  . ONE TOUCH ULTRA TEST test strip USE 1 STRIP TWICE A DAY 200 each 3  . ONETOUCH DELICA LANCETS 41P MISC     . potassium chloride (K-DUR) 10 MEQ tablet Take 20 mEq by mouth daily.    . rosuvastatin (CRESTOR) 20 MG tablet TAKE 1 TABLET BY MOUTH AT BEDTIME NEEDS APPT FOR REFILLS 90 tablet 0   No current facility-administered medications on file prior to visit.     No Known Allergies  Family History  Problem Relation Age of Onset  . Cancer Father        "all over"  . Coronary artery disease Brother   . Diabetes Brother   . Stroke Mother   . Diabetes Mother   . Cancer Brother   . Colon cancer Neg Hx   . Esophageal  cancer Neg Hx   . Rectal cancer Neg Hx   . Stomach cancer Neg Hx     BP (!) 144/50 (BP Location: Left Arm, Patient Position: Sitting, Cuff Size: Large)   Pulse 73   Ht 5\' 7"  (1.702 m)   Wt 217 lb 6.4 oz (98.6 kg)   SpO2 97%   BMI 34.05 kg/m    Review of Systems He  denies hypoglycemia.      Objective:   Physical Exam VITAL SIGNS:  See vs page GENERAL: no distress Pulses: dorsalis pedis intact bilat.   MSK: no deformity of the feet CV: 2+ bilat leg edema Skin:  no ulcer on the feet.  normal color and temp on the feet. Neuro: sensation is intact to touch on the feet.  Ext: there is bilateral onychomycosis of the toenails  Lab Results  Component Value Date   HGBA1C 6.3 (A) 05/09/2019   Lab Results  Component Value Date   CREATININE 1.62 (H) 04/22/2019   BUN 29 (H) 04/22/2019   NA 143 04/22/2019   K 5.1 04/22/2019   CL 111 (H) 04/22/2019   CO2 20 04/22/2019       Assessment & Plan:  HTN: is noted today Insulin-requiring type 2 DM, with renal insuff: overcontrolled, given this insulinregimen, which does match insulin to her changing needs throughout the day Edema: This limits rx options  Patient Instructions  Your blood pressure is high today.  Please see your primary care provider soon, to have it rechecked.   Please reduce the lantus to 70 units each morning.   check your blood sugar twice a day.  vary the time of day when you check, between before the 3 meals, and at bedtime.  also check if you have symptoms of your blood sugar being too high or too low.  please keep a record of the readings and bring it to your next appointment here (or you can bring the meter itself).  You can write it on any piece of paper.  please call us sooner if your blood sugar goes below 70, or if you have a lot of readings over 200. Please have a follow-up appointment in 2 months.

## 2019-05-13 ENCOUNTER — Telehealth: Payer: Self-pay | Admitting: Physician Assistant

## 2019-05-13 ENCOUNTER — Telehealth: Payer: Self-pay | Admitting: Licensed Clinical Social Worker

## 2019-05-13 NOTE — Telephone Encounter (Signed)

## 2019-05-13 NOTE — Progress Notes (Signed)
Virtual Visit via Video Note   This visit type was conducted due to national recommendations for restrictions regarding the COVID-19 Pandemic (e.g. social distancing) in an effort to limit this patient's exposure and mitigate transmission in our community.  Due to his co-morbid illnesses, this patient is at least at moderate risk for complications without adequate follow up.  This format is felt to be most appropriate for this patient at this time.  All issues noted in this document were discussed and addressed.  A limited physical exam was performed with this format.  Please refer to the patient's chart for his consent to telehealth for [facility].   Date:  05/14/2019   ID:  [place], DOB July 12, 1941, MRN [id number]  Patient Location: Home Provider Location: Home  PCP:  Jearld Fenton, NP  Cardiologist:  Lauree Chandler, MD   Electrophysiologist:  Thompson Grayer, MD   Evaluation Performed:  Follow-Up Visit  Chief Complaint:   Legs swollen  History of Present Illness:    Jake Gross is a 78 y.o. 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  CHA2DS2-VASc equals 7. patient was seen in the A. fib clinic 12/09/2018 and Plavix was stopped and Eliquis 5 mg twice daily started.  Aspirin was continued at 81 mg.  2D echo 11/21/2018 normal LVEF 60 to 65% mild dilation of the aortic root and ascending aorta.     I have been treating the patient since March for heart failure and elevated blood pressures.  We were delivering heart healthy meals.  Last time I had telemedicine visit with him 04/16/2026 he felt better than ever since he was eating better.  He got a new blood pressure cuff and the blood pressure was running quite high.  Home health visited the patient and said his medicine box was empty so I did not change any of his medications  and asked pharmacy to arrange filling his meds.  Patient says legs started swelling last Friday and BP up as well. Also dyspnea on exertion. Weight staying the same at 210.  The patient does not have symptoms concerning for COVID-19 infection (fever, chills, cough, or new shortness of breath).    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 NO ISCHEMIA   COLONOSCOPY W/ POLYPECTOMY     5 polyps   CORONARY ARTERY BYPASS GRAFT N/A 11/26/2018   Procedure: CORONARY ARTERY BYPASS GRAFTING (CABG), ON PUMP, TIMES FOUR, USING LEFT INTERNAL MAMMARY ARTERY AND ENDOSCOPICALLY HARVESTED LEFT SAPHENOUS VEIN;  Surgeon: Gaye Pollack, MD;  Location: [facility];  Service: Open Heart Surgery;  Laterality: N/A;   EUS N/A 03/11/2015   Procedure: LOWER ENDOSCOPIC ULTRASOUND (EUS);  Surgeon: Milus Banister, MD;  Location: [facility];  Service: Endoscopy;  Laterality: N/A;   FLEXIBLE SIGMOIDOSCOPY N/A 02/02/2015   Procedure: FLEXIBLE SIGMOIDOSCOPY;  Surgeon: Inda Castle, MD;  Location: [facility];  Service: Endoscopy;  Laterality: N/A;  ERBE   LEFT HEART CATH AND CORONARY ANGIOGRAPHY N/A 11/20/2018   Procedure: LEFT HEART CATH AND CORONARY ANGIOGRAPHY;  Surgeon: Burnell Blanks, MD;  Location: [place] CV LAB;  Service: Cardiovascular;  Laterality: N/A;   LOOP RECORDER INSERTION N/A 08/14/2017   Procedure: LOOP RECORDER INSERTION;  Surgeon: Thompson Grayer, MD;  Location: [place] CV LAB;  Service: Cardiovascular;  Laterality: N/A;   PARTIAL PROCTECTOMY BY TEM N/A 04/08/2015   Procedure: TEM PARTIAL PROCTECTOMY OF  RECTAL MASS;  Surgeon: Michael Boston, MD;  Location: [facility];  Service: General;  Laterality: N/A;   TEE WITHOUT CARDIOVERSION N/A 06/25/2017   Procedure: TRANSESOPHAGEAL ECHOCARDIOGRAM (TEE);  Surgeon: Skeet Latch, MD;  Location: [facility];  Service: Cardiovascular;  Laterality: N/A;   TEE WITHOUT CARDIOVERSION N/A 11/26/2018   Procedure: TRANSESOPHAGEAL ECHOCARDIOGRAM (TEE);  Surgeon: Gaye Pollack, MD;  Location: [facility];  Service: Open Heart Surgery;  Laterality: N/A;   TONSILLECTOMY AND ADENOIDECTOMY     as child     Current Meds  Medication Sig   amLODipine (NORVASC) 5 MG tablet Take 1 tablet (5 mg total) by mouth daily.   apixaban (ELIQUIS) 5 MG TABS tablet Take 1 tablet (5 mg total) by mouth 2 (two) times daily.   aspirin 81 MG tablet Take 1 tablet (81 mg total) by mouth daily.   fenofibrate micronized (LOFIBRA) 67 MG capsule TAKE 1 CAPSULE DAILY BEFORE BREAKFAST   furosemide (LASIX) 40 MG tablet Take 40 mg by mouth daily.   hydrALAZINE (APRESOLINE) 50 MG tablet Take 1.5 tablets (75 mg total) by mouth 3 (three) times daily.   Insulin Glargine (LANTUS SOLOSTAR) 100 UNIT/ML Solostar Pen Inject 70 Units into the skin every morning.   isosorbide mononitrate (IMDUR) 30 MG 24 hr tablet Take 1 tablet (30 mg total) by mouth daily.   metoprolol tartrate (LOPRESSOR) 25 MG tablet Take 1 tablet (25 mg total) by mouth 2 (two) times daily.   ONE TOUCH ULTRA TEST test strip USE 1 STRIP TWICE A DAY   ONETOUCH DELICA LANCETS 53G MISC    potassium chloride (K-DUR) 10 MEQ tablet Take 20 mEq by mouth daily.   rosuvastatin (CRESTOR) 20 MG tablet TAKE 1 TABLET BY MOUTH AT BEDTIME NEEDS APPT FOR REFILLS     Allergies:   Patient has no known allergies.   Social History   Tobacco Use   Smoking status: Former Smoker    Types: Cigarettes    Quit date: 08/13/1994    Years since quitting: 24.7   Smokeless tobacco: Never Used  Substance Use Topics   Alcohol use: No     Alcohol/week: 0.0 standard drinks   Drug use: No     Family Hx: The patient's family history includes Cancer in his brother and father; Coronary artery disease in his brother; Diabetes in his brother and mother; Stroke in his mother. There is no history of Colon cancer, Esophageal cancer, Rectal cancer, or Stomach cancer.  ROS:   Please see the history of present illness.      All other systems reviewed and are negative.   Prior CV studies:   The following studies were reviewed today:   Cath: 11/20/2018    Prox RCA to Mid RCA lesion is 30% stenosed.  RPDA lesion is 99% stenosed.  Post Atrio lesion is 100% stenosed.  Ost 2nd Diag to 2nd Diag lesion is 99% stenosed.  Prox LAD lesion is 95% stenosed.  Ost 1st Diag lesion is 80%  stenosed.  Ost Cx to Prox Cx lesion is 70% stenosed.  Ost LM lesion is 70% stenosed.   1. Moderately severe ostial left main stenosis.  2. Severe stenosis in the heavily calcified mid LAD 3. Moderately severe proximal Circumflex stenosis 4. The RCA has mild calcific disease through the proximal and mid segments. The PDA is a moderate caliber vessel with severe stenosis. 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  5. Elevated LVEDP   Recommendations: He has severe left main and three vessel CAD. I will ask CT surgery to see him to discuss CABG. Given chronic kidney disease, I will admit him for hydration. Will hold Plavix. He will need Plavix washout prior to surgery. Will arrange echo tomorrow to assess LVEF. Will start ASA. Continue beta blocker and statin. Given elevated filling pressures, one dose of IV Lasix tonight.    TTE: 11/21/2018 Echo 11/21/18 demonstrated  1. The left ventricle has normal systolic function of 36-14%. The cavity size was mildly increased. There is no increased left ventricular wall thickness. Echo evidence of impaired diastolic relaxation.  2. The right ventricle has normal  systolic function. The cavity was normal. There is no increase in right ventricular wall thickness.  3. Right atrial size was mildly dilated.  4. The mitral valve is normal in structure. There is mild to moderate mitral annular calcification present. No evidence of mitral valve stenosis. No mitral regurgitation.  5. The tricuspid valve is normal in structure.  6. The aortic valve is tricuspid There is mild calcification of the aortic valve. No aortic stenosis.  7. There is mild dilatation of the aortic root and ascending aorta.  8. No evidence of left ventricular regional wall motion abnormalities.  9. No complete TR doppler jet so unable to estimate PA systolic pressure. LA 16mm       Labs/Other Tests and Data Reviewed:    EKG:  No ECG reviewed.  Recent Labs: 11/27/2018: Magnesium 2.8 11/29/2018: Hemoglobin 10.5; Platelets 177 04/22/2019: BUN 29; Creatinine, Ser 1.62; Potassium 5.1; Sodium 143   Recent Lipid Panel Lab Results  Component Value Date/Time   CHOL 138 11/12/2018 03:28 PM   TRIG 214.0 (H) 11/12/2018 03:28 PM   HDL 25.60 (L) 11/12/2018 03:28 PM   CHOLHDL 5 11/12/2018 03:28 PM   LDLCALC 56 06/22/2017 05:16 AM   LDLDIRECT 80.0 11/12/2018 03:28 PM    Wt Readings from Last 3 Encounters:  05/14/19 210 lb (95.3 kg)  05/09/19 217 lb 6.4 oz (98.6 kg)  04/16/19 210 lb (95.3 kg)     Objective:    Vital Signs:  BP (!) 170/78    Ht 5\' 7"  (1.702 m)    Wt 210 lb (95.3 kg)    BMI 32.89 kg/m    VITAL SIGNS:  reviewed GEN:  no acute distress RESPIRATORY:  normal respiratory effort, symmetric expansion CARDIOVASCULAR:  lower extremity edema noted 2 plus edema bilaterally. Increased JVD  ASSESSMENT & PLAN:    1. CAD status post CABG times 42/11/20 for left main disease on aspirin and reduced beta-blocker because of bradycardia in the hospital.  Plavix stopped because he is on Eliquis for atrial fibrillation-no angina 2. Postop atrial fibrillation found on ILR longest episode  12 hours started on Eliquis for chads vascof 7 3. Acute on Chronic diastolic CHF with increased leg swelling and shortness of breath. Increase Lasix 40 mg 2 a day for 3 days then 1 a day, KDur 10 meq take 3  a day for 3 days then 2 day. Heart healthy meals are ending-he really benefited from this program.  4. Essential hypertension blood pressure has been running high but home health nurse indicated his medicine box was empty and we were not sure he was taking them properly.  I asked pharmacist to organize his medications for him.Son now filling med box. BP still high will increase Imdur 60 mg daily. 5. History of cryptogenic stroke 2018 on Plavix and aspirin but Plavix stopped when he was placed on Eliquis-no bleeding problems. 6. CKD no ACE inhibitor or ARB creatinine 1.62 on 04/22/2019  COVID-19 Education: The signs and symptoms of COVID-19 were discussed with the patient and how to seek care for testing (follow up with PCP or arrange E-visit).   The importance of social distancing was discussed today.  Time:   Today, I have spent 13 minutes with the patient with telehealth technology discussing the above problems.     Medication Adjustments/Labs and Tests Ordered: Current medicines are reviewed at length with the patient today.  Concerns regarding medicines are outlined above.   Tests Ordered: No orders of the defined types were placed in this encounter.   Medication Changes: No orders of the defined types were placed in this encounter.   Follow Up:  In Person in 1 month(s) Dr. Angelena Form  Signed, Ermalinda Barrios, PA-C  05/14/2019 1:19 PM    [place]

## 2019-05-13 NOTE — Telephone Encounter (Signed)
CSW contacted patient to inform the last delivery of the [employer] Summer Covid relief program will be July 31st. Patient verbalizes understanding and grateful for the support during this health crisis. Patient provided with the [employer] number to contact if interested in continuing with a private pay option. Patient denies any other concerns at this time. CSW continues to be available if needed.  Jackie Tomi Paddock, LCSW, CCSW-MCS [phone]  

## 2019-05-14 ENCOUNTER — Telehealth: Payer: Self-pay | Admitting: *Deleted

## 2019-05-14 ENCOUNTER — Telehealth (INDEPENDENT_AMBULATORY_CARE_PROVIDER_SITE_OTHER): Payer: Medicare Other | Admitting: Physician Assistant

## 2019-05-14 ENCOUNTER — Other Ambulatory Visit: Payer: Self-pay

## 2019-05-14 ENCOUNTER — Encounter: Payer: Self-pay | Admitting: Physician Assistant

## 2019-05-14 VITALS — BP 170/78 | Ht 67.0 in | Wt 210.0 lb

## 2019-05-14 DIAGNOSIS — N189 Chronic kidney disease, unspecified: Secondary | ICD-10-CM

## 2019-05-14 DIAGNOSIS — I10 Essential (primary) hypertension: Secondary | ICD-10-CM | POA: Diagnosis not present

## 2019-05-14 DIAGNOSIS — I480 Paroxysmal atrial fibrillation: Secondary | ICD-10-CM

## 2019-05-14 DIAGNOSIS — I5033 Acute on chronic diastolic (congestive) heart failure: Secondary | ICD-10-CM

## 2019-05-14 DIAGNOSIS — I5032 Chronic diastolic (congestive) heart failure: Secondary | ICD-10-CM

## 2019-05-14 DIAGNOSIS — Z8673 Personal history of transient ischemic attack (TIA), and cerebral infarction without residual deficits: Secondary | ICD-10-CM | POA: Diagnosis not present

## 2019-05-14 DIAGNOSIS — I25810 Atherosclerosis of coronary artery bypass graft(s) without angina pectoris: Secondary | ICD-10-CM | POA: Diagnosis not present

## 2019-05-14 DIAGNOSIS — I2510 Atherosclerotic heart disease of native coronary artery without angina pectoris: Secondary | ICD-10-CM

## 2019-05-14 MED ORDER — ISOSORBIDE MONONITRATE ER 60 MG PO TB24: 60.0000 mg | ORAL_TABLET | Freq: Every day | ORAL | 3 refills | Status: DC

## 2019-05-14 NOTE — Telephone Encounter (Signed)
[place] Visit Follow Up Request   Date of Request ([place]):  May 14, 2019  Requesting Provider:  Ermalinda Barrios, [facility]    Agency Requested:    [facility] Contact:  Glory Buff, NP [address] [place], [place] [address] Phone #:  [phone] Fax #:  [phone]  Patient Demographic Information: Name:  Jake Gross Age:  78 y.o.   DOB:  January 29, 1941  MRN:  [id number]   Home visit progress note(s), lab results, telemetry strips, etc were reviewed.  Provider Recommendations: LABS and BP CHECK FOR HEART FAILURE  Follow up home services requested:  Vital Signs (BP, Pulse, O2, Weight)  Labs:  BMET, PRO BNP  NEXT AVAILABLE  1 VISIT

## 2019-05-14 NOTE — Telephone Encounter (Signed)
-----   Message from Mendel Ryder, [place] sent at 05/14/2019  1:24 PM EDT ----- Regarding: [facility] Home Visit Request  Requesting provider:   Ermalinda Barrios   Please specify if there is a specific agency the provider is requesting:   [facility] (Labs, IV Lasix)  Reason for home visit:   Labs,  Jake Gross would like his BP and Heart Failure checked   Is this the initial request or a follow up?:   Follow Up Request  Services requested:   Labs:  BMET & BNP   When services are needed:   Next Available   # of visits/frequency requested:   [place], [place]  05/14/2019 1:25 PM

## 2019-05-14 NOTE — Patient Instructions (Addendum)
Medication Instructions:  INCREASE: Lasix to 40 mg twice a day for 3 days then back to once a day   INCREASE: Potassium to 3 tablets a day for 3 days then back to once a day   INCREASE: Imdur to 60 mg once a day   If you need a refill on your cardiac medications before your next appointment, please call your pharmacy.   Lab work: FUTURE: BMET & BNP- Too be done by [facility]   If you have labs (blood work) drawn today and your tests are completely normal, you will receive your results only by: Marland Kitchen MyChart Message (if you have MyChart) OR . A paper copy in the mail If you have any lab test that is abnormal or we need to change your treatment, we will call you to review the results.  Testing/Procedures: None   Follow-Up: You are scheduled to see Dr. Angelena Form on 06/09/2019 @ 11:20 am  Any Other Special Instructions Will Be Listed Below (If Applicable).

## 2019-05-19 ENCOUNTER — Other Ambulatory Visit: Payer: Self-pay | Admitting: Internal Medicine

## 2019-05-19 DIAGNOSIS — I5032 Chronic diastolic (congestive) heart failure: Secondary | ICD-10-CM | POA: Diagnosis not present

## 2019-05-19 NOTE — Progress Notes (Signed)
[facility] has been attempting to reach patient for labs, VS and weight. Called this am--no mailbox set up on phone.  Will reattempt call this am and if no response, will drive by to attempt to draw labs if patient home

## 2019-05-20 LAB — BASIC METABOLIC PANEL
BUN/Creatinine Ratio: 16 (ref 10–24)
BUN: 30 mg/dL — ABNORMAL HIGH (ref 8–27)
CO2: 17 mmol/L — ABNORMAL LOW (ref 20–29)
Calcium: 8.8 mg/dL (ref 8.6–10.2)
Chloride: 108 mmol/L — ABNORMAL HIGH (ref 96–106)
Creatinine, Ser: 1.87 mg/dL — ABNORMAL HIGH (ref 0.76–1.27)
GFR calc Af Amer: 39 mL/min/{1.73_m2} — ABNORMAL LOW (ref >59)
GFR calc non Af Amer: 34 mL/min/{1.73_m2} — ABNORMAL LOW (ref >59)
Glucose: 170 mg/dL — ABNORMAL HIGH (ref 65–99)
Potassium: 4.8 mmol/L (ref 3.5–5.2)
Sodium: 141 mmol/L (ref 134–144)

## 2019-05-20 LAB — PRO B NATRIURETIC PEPTIDE: NT-Pro BNP: 744 pg/mL — ABNORMAL HIGH (ref 0–486)

## 2019-05-23 ENCOUNTER — Telehealth: Payer: Self-pay

## 2019-05-23 NOTE — Telephone Encounter (Signed)
-----   Message from Consuelo Pandy, [place] sent at 05/22/2019  5:52 PM EDT ----- His labs are c/w acute CHF. He was recently advised at office visit my michele lenze to increase his lasix. Please call to see if his swelling has improved. Keep appt w/ Dr. Angelena Form later this month.

## 2019-05-23 NOTE — Telephone Encounter (Signed)
Left detailed message for Pt per DPR.  Advised of lab results.  Advised IF Pt continued to having lower extremity edema and no improvement with increased lasix to call office to report.  Advised if swelling was better no action needed and to f/u with Dr. Angelena Form as scheduled.    Left office # to call ONLY if swelling not improved.

## 2019-05-26 NOTE — Progress Notes (Signed)
Can you ask patient to increase lasix 60 mg daily and Kdur 10 meq 3 tablets daily. Ask HH to draw bmet and bnp  Next week

## 2019-05-27 ENCOUNTER — Ambulatory Visit (INDEPENDENT_AMBULATORY_CARE_PROVIDER_SITE_OTHER): Payer: Medicare Other | Admitting: *Deleted

## 2019-05-27 DIAGNOSIS — I480 Paroxysmal atrial fibrillation: Secondary | ICD-10-CM

## 2019-05-27 DIAGNOSIS — I6389 Other cerebral infarction: Secondary | ICD-10-CM

## 2019-05-28 LAB — CUP PACEART REMOTE DEVICE CHECK
Date Time Interrogation Session: 20200812024037
Implantable Pulse Generator Implant Date: 20181030

## 2019-05-29 ENCOUNTER — Telehealth: Payer: Self-pay

## 2019-05-29 ENCOUNTER — Telehealth: Payer: Self-pay | Admitting: *Deleted

## 2019-05-29 DIAGNOSIS — I5032 Chronic diastolic (congestive) heart failure: Secondary | ICD-10-CM

## 2019-05-29 NOTE — Telephone Encounter (Signed)
[place] Visit Follow Up Request   Date of Request ([place]):  May 29, 2019  Requesting Provider:  Ermalinda Gross, [facility]    Agency Requested:    [facility] Contact:  Jake Buff, NP [address] [place], [place] [address] Phone #:  [phone] Fax #:  [phone]  Patient Demographic Information: Name:  Jake Gross Age:  78 y.o.   DOB:  [birth year]  MRN:  [id number]   Home visit progress note(s), lab results, telemetry strips, etc were reviewed.  Provider Recommendations: LABS  Follow up home services requested:  Labs:  BMET, PRO BNP  WHEN SERVICES ARE NEEDED: NEXT WEEK   NUMBER OF VISITS: 1

## 2019-05-29 NOTE — Telephone Encounter (Signed)
Called and made patient aware of Ermalinda Barrios, PA's recommendations to increase lasix to 60 mg QD and k-dur to 30 mEq QD. Made patient aware that we will arrange for [facility] to draw BMET and BNP next week. Patient verbalized understanding and thanked me for the call.

## 2019-05-29 NOTE — Telephone Encounter (Signed)
-----   Message from Imogene Burn, [place] sent at 05/26/2019 10:23 AM EDT -----   ----- Message ----- From: [name] Milliner Sent: 05/19/2019   4:49 PM EDT To: Imogene Burn, PA-C

## 2019-05-29 NOTE — Telephone Encounter (Signed)
Jake Burn, PA-C (Physician Assistant)       Added by: [x] Lenze, Michele M, PA-C  [] Hover for details Can you ask patient to increase lasix 60 mg daily and Kdur 10 meq 3 tablets daily. Ask HH to draw bmet and bnp  Next week

## 2019-06-02 DIAGNOSIS — I5032 Chronic diastolic (congestive) heart failure: Secondary | ICD-10-CM | POA: Diagnosis not present

## 2019-06-03 ENCOUNTER — Other Ambulatory Visit: Payer: Self-pay

## 2019-06-03 DIAGNOSIS — E875 Hyperkalemia: Secondary | ICD-10-CM

## 2019-06-03 LAB — PRO B NATRIURETIC PEPTIDE: NT-Pro BNP: 677 pg/mL — ABNORMAL HIGH (ref 0–486)

## 2019-06-03 LAB — BASIC METABOLIC PANEL
BUN/Creatinine Ratio: 17 (ref 10–24)
BUN: 31 mg/dL — ABNORMAL HIGH (ref 8–27)
CO2: 21 mmol/L (ref 20–29)
Calcium: 9 mg/dL (ref 8.6–10.2)
Chloride: 110 mmol/L — ABNORMAL HIGH (ref 96–106)
Creatinine, Ser: 1.85 mg/dL — ABNORMAL HIGH (ref 0.76–1.27)
GFR calc Af Amer: 40 mL/min/{1.73_m2} — ABNORMAL LOW (ref >59)
GFR calc non Af Amer: 35 mL/min/{1.73_m2} — ABNORMAL LOW (ref >59)
Glucose: 145 mg/dL — ABNORMAL HIGH (ref 65–99)
Potassium: 5.3 mmol/L — ABNORMAL HIGH (ref 3.5–5.2)
Sodium: 144 mmol/L (ref 134–144)

## 2019-06-05 NOTE — Progress Notes (Signed)
Carelink Summary Report / Loop Recorder 

## 2019-06-09 ENCOUNTER — Encounter: Payer: Self-pay | Admitting: Cardiovascular Disease

## 2019-06-09 ENCOUNTER — Ambulatory Visit (INDEPENDENT_AMBULATORY_CARE_PROVIDER_SITE_OTHER): Payer: Medicare Other | Admitting: Cardiovascular Disease

## 2019-06-09 ENCOUNTER — Other Ambulatory Visit: Payer: Medicare Other | Admitting: *Deleted

## 2019-06-09 ENCOUNTER — Other Ambulatory Visit: Payer: Self-pay

## 2019-06-09 VITALS — BP 150/60 | HR 69 | Ht 67.0 in | Wt 213.0 lb

## 2019-06-09 DIAGNOSIS — I2510 Atherosclerotic heart disease of native coronary artery without angina pectoris: Secondary | ICD-10-CM | POA: Diagnosis not present

## 2019-06-09 DIAGNOSIS — E875 Hyperkalemia: Secondary | ICD-10-CM | POA: Diagnosis not present

## 2019-06-09 DIAGNOSIS — I25810 Atherosclerosis of coronary artery bypass graft(s) without angina pectoris: Secondary | ICD-10-CM | POA: Diagnosis not present

## 2019-06-09 DIAGNOSIS — I10 Essential (primary) hypertension: Secondary | ICD-10-CM

## 2019-06-09 DIAGNOSIS — I5032 Chronic diastolic (congestive) heart failure: Secondary | ICD-10-CM | POA: Diagnosis not present

## 2019-06-09 LAB — BASIC METABOLIC PANEL
BUN/Creatinine Ratio: 17 (ref 10–24)
BUN: 32 mg/dL — ABNORMAL HIGH (ref 8–27)
CO2: 18 mmol/L — ABNORMAL LOW (ref 20–29)
Calcium: 8.9 mg/dL (ref 8.6–10.2)
Chloride: 109 mmol/L — ABNORMAL HIGH (ref 96–106)
Creatinine, Ser: 1.84 mg/dL — ABNORMAL HIGH (ref 0.76–1.27)
GFR calc Af Amer: 40 mL/min/{1.73_m2} — ABNORMAL LOW (ref >59)
GFR calc non Af Amer: 35 mL/min/{1.73_m2} — ABNORMAL LOW (ref >59)
Glucose: 160 mg/dL — ABNORMAL HIGH (ref 65–99)
Potassium: 5.1 mmol/L (ref 3.5–5.2)
Sodium: 140 mmol/L (ref 134–144)

## 2019-06-09 LAB — PRO B NATRIURETIC PEPTIDE: NT-Pro BNP: 775 pg/mL — ABNORMAL HIGH (ref 0–486)

## 2019-06-09 MED ORDER — HYDRALAZINE HCL 100 MG PO TABS: 100.0000 mg | ORAL_TABLET | Freq: Three times a day (TID) | ORAL | 3 refills | Status: DC

## 2019-06-09 NOTE — Patient Instructions (Addendum)
Medication Instructions:  Your physician has recommended you make the following change in your medication:   INCREASE HYDRALAZINE TO 100 MG THREE TIMES DAILY   If you need a refill on your cardiac medications before your next appointment, please call your pharmacy.   Lab work Your physician recommends that you return for lab work today: Pro BNP BMET   If you have labs (blood work) drawn today and your tests are completely normal, you will receive your results only by: Marland Kitchen MyChart Message (if you have MyChart) OR . A paper copy in the mail If you have any lab test that is abnormal or we need to change your treatment, we will call you to review the results.  Testing/Procedures: NONE    Any Other Special Instructions Will Be Listed Below (If Applicable).  FOLLOW-UP APPT WITH MICHELLE LENZE ON 07/22/19 at 1030

## 2019-06-09 NOTE — Progress Notes (Signed)
Chief Complaint  Patient presents with  . Follow-up    CAD   History of Present Illness: 78 yo male with history of CAD s/p CABG in February 2020, HTN, HLD, CKD, prior CVA and paroxysmal atrial fibrillation who is here today for cardiac follow up. His atrial fibrillation is followed by Dr. Rayann Heman. He is on Eliqus. He was found to have severe left main and three vessel CAD in February 2020 and underwent 4V CABG in February 2020. Echo February 2020 with LVEF=60-65%. No significant valve disease.   He is here today for follow up. The patient denies any chest pain, palpitations, lower extremity edema, orthopnea, PND, dizziness, near syncope or syncope. His swelling is much better on the increased dose of Lasix. He has some dyspnea when climbing hills.    Primary Care Physician: Jearld Fenton, NP   Past Medical History:  Diagnosis Date  . 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 Arthritis   . Cervical spondylosis   . Diabetes mellitus   . Hyperlipidemia   . Hypertension   . Stroke ([place]) 06/19/2017  . Vitamin D deficiency     Past Surgical History:  Procedure Laterality Date  . ABCESS DRAINAGE Left    buttocks  . CARDIOVASCULAR STRESS TEST  10/12/1999   EF 63%. NO ISCHEMIA  . COLONOSCOPY W/ POLYPECTOMY     5 polyps  . CORONARY ARTERY BYPASS GRAFT N/A 11/26/2018   Procedure: CORONARY ARTERY BYPASS GRAFTING (CABG), ON PUMP, TIMES FOUR, USING LEFT INTERNAL MAMMARY ARTERY AND ENDOSCOPICALLY HARVESTED LEFT SAPHENOUS VEIN;  Surgeon: Gaye Pollack, MD;  Location: [facility];  Service: Open Heart Surgery;  Laterality: N/A;  . EUS N/A 03/11/2015   Procedure: LOWER ENDOSCOPIC ULTRASOUND (EUS);  Surgeon: Milus Banister, MD;  Location: [facility];  Service: Endoscopy;  Laterality: N/A;  . FLEXIBLE SIGMOIDOSCOPY N/A 02/02/2015   Procedure: FLEXIBLE SIGMOIDOSCOPY;  Surgeon: Inda Castle, MD;  Location: [facility];  Service: Endoscopy;   Laterality: N/A;  ERBE  . LEFT HEART CATH AND CORONARY ANGIOGRAPHY N/A 11/20/2018   Procedure: LEFT HEART CATH AND CORONARY ANGIOGRAPHY;  Surgeon: Burnell Blanks, MD;  Location: [facility];  Service: Cardiovascular;  Laterality: N/A;  . LOOP RECORDER INSERTION N/A 08/14/2017   Procedure: LOOP RECORDER INSERTION;  Surgeon: Thompson Grayer, MD;  Location: [facility];  Service: Cardiovascular;  Laterality: N/A;  . PARTIAL PROCTECTOMY BY TEM N/A 04/08/2015   Procedure: TEM PARTIAL PROCTECTOMY OF RECTAL MASS;  Surgeon: Michael Boston, MD;  Location: [facility];  Service: General;  Laterality: N/A;  . TEE WITHOUT CARDIOVERSION N/A 06/25/2017   Procedure: TRANSESOPHAGEAL ECHOCARDIOGRAM (TEE);  Surgeon: Skeet Latch, MD;  Location: [facility];  Service: Cardiovascular;  Laterality: N/A;  . TEE WITHOUT CARDIOVERSION N/A 11/26/2018   Procedure: TRANSESOPHAGEAL ECHOCARDIOGRAM (TEE);  Surgeon: Gaye Pollack, MD;  Location: [facility];  Service: Open Heart Surgery;  Laterality: N/A;  . TONSILLECTOMY AND ADENOIDECTOMY     as child    Current Outpatient Medications  Medication Sig Dispense Refill  . amLODipine (NORVASC) 5 MG tablet Take 1 tablet (5 mg total) by mouth daily. 90 tablet 3  . apixaban (ELIQUIS) 5 MG TABS tablet Take 1 tablet (5 mg total) by mouth 2 (two) times daily. 60 tablet 6  . aspirin 81 MG tablet Take 1 tablet (81 mg total) by mouth daily.    . fenofibrate micronized (LOFIBRA) 67 MG capsule  TAKE 1 CAPSULE DAILY BEFORE BREAKFAST 90 capsule 1  . furosemide (LASIX) 40 MG tablet Take 60 mg by mouth daily.    . Insulin Glargine (LANTUS SOLOSTAR) 100 UNIT/ML Solostar Pen Inject 70 Units into the skin every morning. 30 pen 11  . isosorbide mononitrate (IMDUR) 60 MG 24 hr tablet Take 1 tablet (60 mg total) by mouth daily. 90 tablet 3  . metoprolol tartrate (LOPRESSOR) 25 MG tablet Take 1 tablet (25 mg total) by mouth 2 (two) times daily. 60 tablet 3  . ONE TOUCH ULTRA TEST test  strip USE 1 STRIP TWICE A DAY 200 each 3  . ONETOUCH DELICA LANCETS 14N MISC     . potassium chloride (K-DUR) 10 MEQ tablet Take 20 mEq by mouth daily.    . rosuvastatin (CRESTOR) 20 MG tablet TAKE 1 TABLET BY MOUTH AT BEDTIME NEEDS APPT FOR REFILLS 90 tablet 0  . hydrALAZINE (APRESOLINE) 100 MG tablet Take 1 tablet (100 mg total) by mouth 3 (three) times daily. 270 tablet 3   No current facility-administered medications for this visit.     No Known Allergies  Social History   Socioeconomic History  . Marital status: Divorced    Spouse name: Not on file  . Number of children: 2  . Years of education: Not on file  . Highest education level: Not on file  Occupational History  . Occupation: retired    Fish farm manager: [employer]  Social Needs  . Financial resource strain: Not on file  . Food insecurity    Worry: Not on file    Inability: Not on file  . Transportation needs    Medical: Not on file    Non-medical: Not on file  Tobacco Use  . Smoking status: Former Smoker    Types: Cigarettes    Quit date: 08/13/1994    Years since quitting: 24.8  . Smokeless tobacco: Never Used  Substance and Sexual Activity  . Alcohol use: No    Alcohol/week: 0.0 standard drinks  . Drug use: No  . Sexual activity: Not Currently  Lifestyle  . Physical activity    Days per week: Not on file    Minutes per session: Not on file  . Stress: Not on file  Relationships  . Social Herbalist on phone: Not on file    Gets together: Not on file    Attends religious service: Not on file    Active member of club or organization: Not on file    Attends meetings of clubs or organizations: Not on file    Relationship status: Not on file  . Intimate partner violence    Fear of current or ex partner: Not on file    Emotionally abused: Not on file    Physically abused: Not on file    Forced sexual activity: Not on file  Other Topics Concern  . Not on file  Social History Narrative  . Not on file     Family History  Problem Relation Age of Onset  . Cancer Father        "all over"  . Coronary artery disease Brother   . Diabetes Brother   . Stroke Mother   . Diabetes Mother   . Cancer Brother   . Colon cancer Neg Hx   . Esophageal cancer Neg Hx   . Rectal cancer Neg Hx   . Stomach cancer Neg Hx     Review of Systems:  As stated in the  HPI and otherwise negative.   BP (!) 150/60   Pulse 69   Ht 5\' 7"  (1.702 m)   Wt 213 lb (96.6 kg)   SpO2 97%   BMI 33.36 kg/m   Physical Examination: General: Well developed, well nourished, NAD  HEENT: OP clear, mucus membranes moist  SKIN: warm, dry. No rashes. Neuro: No focal deficits  Musculoskeletal: Muscle strength 5/5 all ext  Psychiatric: Mood and affect normal  Neck: No JVD, no carotid bruits, no thyromegaly, no lymphadenopathy.  Lungs:Clear bilaterally, no wheezes, rhonci, crackles Cardiovascular: Regular rate and rhythm. No murmurs, gallops or rubs. Abdomen:Soft. Bowel sounds present. Non-tender.  Extremities: No lower extremity edema. Pulses are 2 + in the bilateral DP/PT.  EKG:  EKG is not ordered today. The ekg ordered today demonstrates   Recent Labs: 11/27/2018: Magnesium 2.8 11/29/2018: Hemoglobin 10.5; Platelets 177 06/02/2019: BUN 31; Creatinine, Ser 1.85; NT-Pro BNP 677; Potassium 5.3; Sodium 144   Lipid Panel    Component Value Date/Time   CHOL 138 11/12/2018 1528   TRIG 214.0 (H) 11/12/2018 1528   HDL 25.60 (L) 11/12/2018 1528   CHOLHDL 5 11/12/2018 1528   VLDL 42.8 (H) 11/12/2018 1528   LDLCALC 56 06/22/2017 0516   LDLDIRECT 80.0 11/12/2018 1528     Wt Readings from Last 3 Encounters:  06/09/19 213 lb (96.6 kg)  05/14/19 210 lb (95.3 kg)  05/09/19 217 lb 6.4 oz (98.6 kg)     Assessment and Plan:   1. CAD s/p CABG without angina: He has no chest pain. Continue ASA, beta blocker, statin and Imdur.   2. HTN: BP is still elevated. Will increase Hydralazine to 100 mg po TID. He does not wish to  increase the Norvasc as this caused LE edema in the past.  3. Paroxysmal atrial fibrillation: Followed in our EP clinic by Dr. Rayann Heman. Appears to be in sinus today. Continue beta blocker and Eliquis.   4. Chronic diastolic CHF: Volume status ok on daily Lasix. Will continue Lasix 60 mg po daily. Repeat BMET and BNP today.   Current medicines are reviewed at length with the patient today.  The patient does not have concerns regarding medicines.  The following changes have been made:  no change  Labs/ tests ordered today include:   Orders Placed This Encounter  Procedures  . Pro b natriuretic peptide     Disposition:   FU with Ermalinda Barrios, PA in 2-3 months.    Signed, Lauree Chandler, MD 06/09/2019 12:15 PM    [address], [place], [place]  [address] Phone: [phone]; Fax: [phone]

## 2019-06-30 ENCOUNTER — Ambulatory Visit (INDEPENDENT_AMBULATORY_CARE_PROVIDER_SITE_OTHER): Payer: Medicare Other | Admitting: *Deleted

## 2019-06-30 DIAGNOSIS — I5033 Acute on chronic diastolic (congestive) heart failure: Secondary | ICD-10-CM | POA: Diagnosis not present

## 2019-06-30 DIAGNOSIS — I480 Paroxysmal atrial fibrillation: Secondary | ICD-10-CM

## 2019-06-30 LAB — CUP PACEART REMOTE DEVICE CHECK
Date Time Interrogation Session: 20200914034105
Implantable Pulse Generator Implant Date: 20181030

## 2019-07-08 ENCOUNTER — Other Ambulatory Visit: Payer: Self-pay | Admitting: Endocrinology

## 2019-07-10 ENCOUNTER — Ambulatory Visit (INDEPENDENT_AMBULATORY_CARE_PROVIDER_SITE_OTHER): Payer: Medicare Other | Admitting: Endocrinology

## 2019-07-10 ENCOUNTER — Encounter: Payer: Self-pay | Admitting: Endocrinology

## 2019-07-10 ENCOUNTER — Other Ambulatory Visit: Payer: Self-pay

## 2019-07-10 VITALS — BP 144/0 | HR 78 | Ht 67.0 in | Wt 210.8 lb

## 2019-07-10 DIAGNOSIS — Z794 Long term (current) use of insulin: Secondary | ICD-10-CM

## 2019-07-10 DIAGNOSIS — I25810 Atherosclerosis of coronary artery bypass graft(s) without angina pectoris: Secondary | ICD-10-CM

## 2019-07-10 DIAGNOSIS — E119 Type 2 diabetes mellitus without complications: Secondary | ICD-10-CM

## 2019-07-10 LAB — POCT GLYCOSYLATED HEMOGLOBIN (HGB A1C): Hemoglobin A1C: 5.8 % — AB (ref 4.0–5.6)

## 2019-07-10 MED ORDER — LANTUS SOLOSTAR 100 UNIT/ML ~~LOC~~ SOPN: 60.0000 [IU] | PEN_INJECTOR | Freq: Every day | SUBCUTANEOUS | 11 refills | Status: DC

## 2019-07-10 NOTE — Progress Notes (Signed)
Subjective:    Patient ID: [employer], male    DOB: 01-07-1941, 78 y.o.   MRN: [id number]  HPI Pt returns for f/u of diabetes mellitus:  DM type: Insulin-requiring type 2 DM.   Dx'ed: 3016 Complications: polyneuropathy, retinopathy, CVA, CAD, and renal insufficiency.  Therapy: insulin since 2005 DKA: never Severe hypoglycemia: last episode was in 2014.   Pancreatitis: never.  Pancreatic imaging: normal CT in 2006 Other: he eats 2 meals per day; he takes QD insulin, after poor results with multiple daily injections.   Interval history: He now takes lantus as rx'ed.  He says cbg varies from 88-114.  pt states he feels well in general.  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 NO ISCHEMIA   COLONOSCOPY W/ POLYPECTOMY     5 polyps   CORONARY ARTERY BYPASS GRAFT N/A 11/26/2018   Procedure: CORONARY ARTERY BYPASS GRAFTING (CABG), ON PUMP, TIMES FOUR, USING LEFT INTERNAL MAMMARY ARTERY AND ENDOSCOPICALLY HARVESTED LEFT SAPHENOUS VEIN;  Surgeon: Gaye Pollack, MD;  Location: [facility];  Service: Open Heart Surgery;  Laterality: N/A;   EUS N/A 03/11/2015   Procedure: LOWER ENDOSCOPIC ULTRASOUND (EUS);  Surgeon: Mil[place] Banister, MD;  Location[facility];  Service: Endoscopy;  Laterality: N/A;   FLEXIBLE SIGMOIDOSCOPY N/A 02/02/2015   Procedure: FLEXIBLE SIGMOIDOSCOPY;  Surgeon: Inda Castle, MD;  [facility];  Service: Endoscopy;  Laterality: N/A;  ERBE   LEFT HEART CATH AND CORONARY ANGIOGRAPHY N/A 11/20/2018   Procedure: LEFT HEART CATH AND CORONARY ANGIOGRAPHY;   Surgeon: Burnell Blanks, MD;  Location: [facility];  Service: Cardiovascular;  Laterality: N/A;   LOOP RECORDER INSERTION N/A 08/14/2017   Procedure: LOOP RECORDER INSERTION;  Surgeon: Thompson Grayer, MD;  Location: [facility];  Service: Cardiovascular;  Laterality: N/A;   PARTIAL PROCTECTOMY BY TEM N/A 04/08/2015   Procedure: TEM PARTIAL PROCTECTOMY OF RECTAL MASS;  Surgeon: Michael Boston, MD;  Location: [facility];  Service: General;  Laterality: N/A;   TEE WITHOUT CARDIOVERSION N/A 06/25/2017   Procedure: TRANSESOPHAGEAL ECHOCARDIOGRAM (TEE);  Surgeon: Skeet Latch, MD;  Location: [facility];  Service: Cardiovascular;  Laterality: N/A;   TEE WITHOUT CARDIOVERSION N/A 11/26/2018   Procedure: TRANSESOPHAGEAL ECHOCARDIOGRAM (TEE);  Surgeon: Gaye Pollack, MD;  Location: [facility];  Service: Open Heart Surgery;  Laterality: N/A;   TONSILLECTOMY AND ADENOIDECTOMY     as child    Social History   Socioeconomic History   Marital status: Divorced    Spouse name: Not on file   Number of children: 2   Years of education: Not on file   Highest education level: Not on file  Occupational History   Occupation: retired    Fish farm manager: [employer]  Social Needs   Financial resource strain: Not on file   Food insecurity    Worry: Not on file    Inability: Not on file   Transportation needs    Medical: Not on file    Non-medical: Not on file  Tobacco Use   Smoking status: Former Smoker    Types: Cigarettes    Quit date: 08/13/1994    Years since quitting: 24.9   Smokeless tobacco: Never Used  Substance and Sexual Activity   Alcohol use: No    Alcohol/week: 0.0 standard drinks   Drug use: No   Sexual activity: Not Currently  Lifestyle   Physical activity    Days per week: Not on file    Minutes per session: Not on file   Stress: Not on file  Relationships   Social connections    Talks on phone: Not on file    Gets together: Not on file    Attends  religious service: Not on file    Active member of club or organization: Not on file    Attends meetings of clubs or organizations: Not on file    Relationship status: Not on file   Intimate partner violence    Fear of current or ex partner: Not on file    Emotionally abused: Not on file    Physically abused: Not on file    Forced sexual activity: Not on file  Other Topics Concern   Not on file  Social History Narrative   Not on file    Current Outpatient Medications on File Prior to Visit  Medication Sig Dispense Refill   amLODipine (NORVASC) 5 MG tablet Take 1 tablet (5 mg total) by mouth daily. 90 tablet 3   apixaban (ELIQUIS) 5 MG TABS tablet Take 1 tablet (5 mg total) by mouth 2 (two) times daily. 60 tablet 6   aspirin 81 MG tablet Take 1 tablet (81 mg total) by mouth daily.     fenofibrate micronized (LOFIBRA) 67 MG capsule TAKE 1 CAPSULE DAILY BEFORE BREAKFAST 90 capsule 1   furosemide (LASIX) 40 MG tablet Take 60 mg by mouth daily.     hydrALAZINE (APRESOLINE) 100 MG tablet Take 1 tablet (100 mg total) by mouth 3 (three) times daily. 270 tablet 3   isosorbide mononitrate (IMDUR) 60 MG 24 hr tablet Take 1 tablet (60 mg total) by mouth daily. 90 tablet 3   metoprolol tartrate (LOPRESSOR) 25 MG tablet Take 1 tablet (25 mg total) by mouth 2 (two) times daily. 60 tablet 3   ONE TOUCH ULTRA TEST test strip USE 1 STRIP TWICE A DAY 200 each 3   ONETOUCH DELICA LANCETS 03J MISC      potassium chloride (K-DUR) 10 MEQ tablet Take 20 mEq by mouth daily.     rosuvastatin (CRESTOR) 20 MG tablet TAKE 1 TABLET BY MOUTH AT BEDTIME NEEDS APPT FOR REFILLS 90 tablet 0   No current facility-administered medications on file prior to visit.     No Known Allergies  Family History  Problem Relation Age of Onset   Cancer Father        "all over"   Coronary artery disease Brother    Diabetes Brother    Stroke Mother    Diabetes Mother    Cancer Brother    Colon cancer  Neg Hx    Esophageal cancer Neg Hx    Rectal cancer Neg Hx    Stomach cancer Neg Hx     BP (!) 144/0 (BP Location: Left Arm, Patient Position: Sitting, Cuff Size: Normal)    Pulse 78    Ht 5\' 7"  (1.702 m)    Wt 210 lb 12.8 oz (95.6 kg)    SpO2 98%    BMI 33.02 kg/m  Review of Systems He denies hypoglycemia.      Objective:   Physical Exam VITAL SIGNS:  See vs page GENERAL: no distress Pulses: dorsalis pedis intact bilat.   MSK: no deformity of the feet CV: 1+ bilat leg edema Skin:  no ulcer on the feet, but the skin is dry.  normal color and temp on the feet. Neuro: sensation is intact to touch on the feet.  Ext: there is bilateral onychomycosis of the toenails  Lab Results  Component Value Date   CREATININE 1.84 (H) 06/09/2019   BUN 32 (H) 06/09/2019   NA 140 06/09/2019   K 5.1 06/09/2019   CL 109 (H) 06/09/2019   CO2 18 (L) 06/09/2019   Lab Results  Component Value Date   HGBA1C 5.8 (A) 07/10/2019      Assessment & Plan:  HTN: is noted today, with wide pulse pressure.   Noncompliance with insulin: much better Insulin-requiring type 2 DM: overcontrolled  Patient Instructions  Your blood pressure is high today.  Please see your primary care provider soon, to have it rechecked.   Please reduce the lantus to 60 units each morning.   Blood tests are requested for you today.  We'll let you know about the results.  check your blood sugar twice a day.  vary the time of day when you check, between before the 3 meals, and at bedtime.  also check if you have symptoms of your blood sugar being too high or too low.  please keep a record of the readings and bring it to your next appointment here (or you can bring the meter itself).  You can write it on any piece of paper.  please call us sooner if your blood sugar goes below 70, or if you have a lot of readings over 200. Please have a follow-up appointment in 2 months.

## 2019-07-10 NOTE — Patient Instructions (Addendum)
Your blood pressure is high today.  Please see your primary care provider soon, to have it rechecked.   Please reduce the lantus to 60 units each morning.   Blood tests are requested for you today.  We'll let you know about the results.  check your blood sugar twice a day.  vary the time of day when you check, between before the 3 meals, and at bedtime.  also check if you have symptoms of your blood sugar being too high or too low.  please keep a record of the readings and bring it to your next appointment here (or you can bring the meter itself).  You can write it on any piece of paper.  please call us sooner if your blood sugar goes below 70, or if you have a lot of readings over 200. Please have a follow-up appointment in 2 months.

## 2019-07-11 NOTE — Progress Notes (Signed)
Carelink Summary Report / Loop Recorder 

## 2019-07-17 LAB — FRUCTOSAMINE: Fructosamine: 197 umol/L — ABNORMAL LOW (ref 205–285)

## 2019-07-22 ENCOUNTER — Ambulatory Visit: Payer: Medicare Other | Admitting: Physician Assistant

## 2019-07-24 DIAGNOSIS — Z23 Encounter for immunization: Secondary | ICD-10-CM | POA: Diagnosis not present

## 2019-08-04 ENCOUNTER — Ambulatory Visit (INDEPENDENT_AMBULATORY_CARE_PROVIDER_SITE_OTHER): Payer: Medicare Other | Admitting: *Deleted

## 2019-08-04 DIAGNOSIS — I480 Paroxysmal atrial fibrillation: Secondary | ICD-10-CM

## 2019-08-04 DIAGNOSIS — I5033 Acute on chronic diastolic (congestive) heart failure: Secondary | ICD-10-CM | POA: Diagnosis not present

## 2019-08-04 LAB — CUP PACEART REMOTE DEVICE CHECK
Date Time Interrogation Session: 20201017033831
Implantable Pulse Generator Implant Date: 20181030

## 2019-08-13 ENCOUNTER — Other Ambulatory Visit: Payer: Self-pay | Admitting: Internal Medicine

## 2019-08-15 ENCOUNTER — Other Ambulatory Visit: Payer: Self-pay | Admitting: Internal Medicine

## 2019-08-22 NOTE — Progress Notes (Signed)
Carelink Summary Report / Loop Recorder 

## 2019-08-25 DIAGNOSIS — Z8673 Personal history of transient ischemic attack (TIA), and cerebral infarction without residual deficits: Secondary | ICD-10-CM | POA: Insufficient documentation

## 2019-08-25 NOTE — Progress Notes (Signed)
Cardiology Office Note    Date:  08/27/2019   ID:  Jake Gross, DOB 24-Apr-1941, MRN [id number]  PCP:  Jearld Fenton, NP  Cardiologist: Lauree Chandler, MD EPS: Thompson Grayer, MD  No chief complaint on file.   History of Present Illness:  Jake Gross is a 78 y.o. 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  CHA2DS2-VASc equals 7. patient was seen in the A. fib clinic 12/09/2018 and Plavix was stopped and Eliquis 5 mg twice daily started.  Aspirin was continued at 81 mg.  2D echo 11/21/2018 normal LVEF 60 to 65% mild dilation of the aortic root and ascending aorta.     Patient had trouble with diastolic heart failure and elevated blood pressures over the summer which improved with adjustment of his diuretics and sending heart healthy meals to him.  Last office visit with Dr. Angelena Form 06/09/2019 volume status was stable on increase Lasix.  Blood pressure was still elevated so hydralazine was increased to 100 mg 3 times daily.  Most recent [facility] report 08/04/2019 no recurrent atrial fibrillation.  Patient comes in very short of breath with little activity.awakened at 2:30 am short of breath relieved with sitting up side of the bed. No chest pain, edema better and BP improved. Says it's gotten worse over past 3 weeks. Can hear his heart beating in his ears. We walked him around the office and HR jumped up to 115 and very short of breath-O2 sat 97-98%     Past Medical History:  Diagnosis Date  . 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 Arthritis   . Cervical spondylosis   . Diabetes mellitus   . Hyperlipidemia   . Hypertension   . Stroke ([place]) 06/19/2017  . Vitamin D deficiency     Past Surgical History:  Procedure Laterality Date  . ABCESS DRAINAGE Left    buttocks  .  CARDIOVASCULAR STRESS TEST  10/12/1999   EF 63%. NO ISCHEMIA  . COLONOSCOPY W/ POLYPECTOMY     5 polyps  . CORONARY ARTERY BYPASS GRAFT N/A 11/26/2018   Procedure: CORONARY ARTERY BYPASS GRAFTING (CABG), ON PUMP, TIMES FOUR, USING LEFT INTERNAL MAMMARY ARTERY AND ENDOSCOPICALLY HARVESTED LEFT SAPHENOUS VEIN;  Surgeon: Gaye Pollack, MD;  Location: [facility];  Service: Open Heart Surgery;  Laterality: N/A;  . EUS N/A 03/11/2015   Procedure: LOWER ENDOSCOPIC ULTRASOUND (EUS);  Surgeon: Milus Banister, MD;  Location: [facility];  Service: Endoscopy;  Laterality: N/A;  . FLEXIBLE SIGMOIDOSCOPY N/A 02/02/2015   Procedure: FLEXIBLE SIGMOIDOSCOPY;  Surgeon: Inda Castle, MD;  Location: [facility];  Service: Endoscopy;  Laterality: N/A;  ERBE  . LEFT HEART CATH AND CORONARY ANGIOGRAPHY N/A 11/20/2018   Procedure: LEFT HEART CATH AND CORONARY ANGIOGRAPHY;  Surgeon: Burnell Blanks, MD;  Location: [facility];  Service: Cardiovascular;  Laterality: N/A;  . LOOP RECORDER INSERTION N/A 08/14/2017   Procedure: LOOP RECORDER INSERTION;  Surgeon: Thompson Grayer, MD;  Location: [facility];  Service: Cardiovascular;  Laterality: N/A;  . PARTIAL PROCTECTOMY BY TEM N/A 04/08/2015   Procedure: TEM PARTIAL PROCTECTOMY OF RECTAL MASS;  Surgeon: Michael Boston, MD;  Location: [facility];  Service: General;  Laterality: N/A;  . TEE WITHOUT CARDIOVERSION N/A 06/25/2017   Procedure: TRANSESOPHAGEAL ECHOCARDIOGRAM (TEE);  Surgeon:  Skeet Latch, MD;  Location: [facility];  Service: Cardiovascular;  Laterality: N/A;  . TEE WITHOUT CARDIOVERSION N/A 11/26/2018   Procedure: TRANSESOPHAGEAL ECHOCARDIOGRAM (TEE);  Surgeon: Gaye Pollack, MD;  Location: [facility];  Service: Open Heart Surgery;  Laterality: N/A;  . TONSILLECTOMY AND ADENOIDECTOMY     as child    Current Medications: Current Meds  Medication Sig  . amLODipine (NORVASC) 5 MG tablet Take 1 tablet (5 mg total) by mouth daily.  Marland Kitchen apixaban  (ELIQUIS) 5 MG TABS tablet Take 1 tablet (5 mg total) by mouth 2 (two) times daily.  Marland Kitchen aspirin 81 MG tablet Take 1 tablet (81 mg total) by mouth daily.  . fenofibrate micronized (LOFIBRA) 67 MG capsule TAKE 1 CAPSULE DAILY BEFORE BREAKFAST  . furosemide (LASIX) 40 MG tablet Take 60 mg by mouth daily.  . hydrALAZINE (APRESOLINE) 100 MG tablet Take 1 tablet (100 mg total) by mouth 3 (three) times daily.  . Insulin Glargine (LANTUS SOLOSTAR) 100 UNIT/ML Solostar Pen Inject 60 Units into the skin daily. And pen needles 1/day  . isosorbide mononitrate (IMDUR) 60 MG 24 hr tablet Take 1 tablet (60 mg total) by mouth daily.  . metoprolol tartrate (LOPRESSOR) 25 MG tablet Take 1 tablet (25 mg total) by mouth 2 (two) times daily.  . ONE TOUCH ULTRA TEST test strip USE 1 STRIP TWICE A DAY  . ONETOUCH DELICA LANCETS 32I MISC   . potassium chloride (K-DUR) 10 MEQ tablet Take 20 mEq by mouth daily.  . rosuvastatin (CRESTOR) 20 MG tablet Take 1 tablet (20 mg total) by mouth daily.     Allergies:   Patient has no known allergies.   Social History   Socioeconomic History  . Marital status: Divorced    Spouse name: Not on file  . Number of children: 2  . Years of education: Not on file  . Highest education level: Not on file  Occupational History  . Occupation: retired    Fish farm manager: [employer]  Social Needs  . Financial resource strain: Not on file  . Food insecurity    Worry: Not on file    Inability: Not on file  . Transportation needs    Medical: Not on file    Non-medical: Not on file  Tobacco Use  . Smoking status: Former Smoker    Types: Cigarettes    Quit date: 08/13/1994    Years since quitting: 25.0  . Smokeless tobacco: Never Used  Substance and Sexual Activity  . Alcohol use: No    Alcohol/week: 0.0 standard drinks  . Drug use: No  . Sexual activity: Not Currently  Lifestyle  . Physical activity    Days per week: Not on file    Minutes per session: Not on file  . Stress: Not on  file  Relationships  . Social Herbalist on phone: Not on file    Gets together: Not on file    Attends religious service: Not on file    Active member of club or organization: Not on file    Attends meetings of clubs or organizations: Not on file    Relationship status: Not on file  Other Topics Concern  . Not on file  Social History Narrative  . Not on file     Family History:  The patient's   family history includes Cancer in his brother and father; Coronary artery disease in his brother; Diabetes in his brother and mother; Stroke in his mother.  ROS:   Please see the history of present illness.    ROS All other systems reviewed and are negative.   PHYSICAL EXAM:   VS:  BP (!) 150/60   Pulse 85   Ht 5\' 7"  (1.702 m)   Wt 203 lb 6.4 oz (92.3 kg)   SpO2 98%   BMI 31.86 kg/m   Physical Exam  GEN: Well nourished, well developed, in no acute distress  Neck: no JVD, carotid bruits, or masses Cardiac:RRR; no murmurs, rubs, or gallops  Respiratory:  clear to auscultation bilaterally, normal work of breathing GI: soft, nontender, nondistended, + BS Ext: trace edema without cyanosis, clubbing, Good distal pulses bilaterally Neuro:  Alert and Oriented x 3 Psych: euthymic mood, full affect  Wt Readings from Last 3 Encounters:  08/27/19 203 lb 6.4 oz (92.3 kg)  07/10/19 210 lb 12.8 oz (95.6 kg)  06/09/19 213 lb (96.6 kg)      Studies/Labs Reviewed:   EKG:  EKG is ordered today.  The ekg ordered today demonstrates NSR with lateral ST and T wave changes consistent with ischemia similar to 12/09/18  Recent Labs: 11/27/2018: Magnesium 2.8 11/29/2018: Hemoglobin 10.5; Platelets 177 06/09/2019: BUN 32; Creatinine, Ser 1.84; NT-Pro BNP 775; Potassium 5.1; Sodium 140   Lipid Panel    Component Value Date/Time   CHOL 138 11/12/2018 1528   TRIG 214.0 (H) 11/12/2018 1528   HDL 25.60 (L) 11/12/2018 1528   CHOLHDL 5 11/12/2018 1528   VLDL 42.8 (H) 11/12/2018 1528    LDLCALC 56 06/22/2017 0516   LDLDIRECT 80.0 11/12/2018 1528    Additional studies/ records that were reviewed today include:   Cath: 11/20/2018    Prox RCA to Mid RCA lesion is 30% stenosed.  RPDA lesion is 99% stenosed.  Post Atrio lesion is 100% stenosed.  Ost 2nd Diag to 2nd Diag lesion is 99% stenosed.  Prox LAD lesion is 95% stenosed.  Ost 1st Diag lesion is 80% stenosed.  Ost Cx to Prox Cx lesion is 70% stenosed.  Ost LM lesion is 70% stenosed.   1. Moderately severe ostial left main stenosis.  2. Severe stenosis in the heavily calcified mid LAD 3. Moderately severe proximal Circumflex stenosis 4. The RCA has mild calcific disease through the proximal and mid segments. The PDA is a moderate caliber vessel with severe stenosis. 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  5. Elevated LVEDP   Recommendations: He has severe left main and three vessel CAD. I will ask CT surgery to see him to discuss CABG. Given chronic kidney disease, I will admit him for hydration. Will hold Plavix. He will need Plavix washout prior to surgery. Will arrange echo tomorrow to assess LVEF. Will start ASA. Continue beta blocker and statin. Given elevated filling pressures, one dose of IV Lasix tonight.    TTE: 11/21/2018 Echo 11/21/18 demonstrated  1. The left ventricle has normal systolic function of 82-42%. The cavity size was mildly increased. There is no increased left ventricular wall thickness. Echo evidence of impaired diastolic relaxation.  2. The right ventricle has normal systolic function. The cavity was normal. There is no increase in right ventricular wall thickness.  3. Right atrial size was mildly dilated.  4. The mitral valve is normal in structure. There is mild to moderate mitral annular calcification present. No evidence of mitral valve stenosis. No mitral regurgitation.  5. The tricuspid valve is normal in structure.  6. The aortic  valve  is tricuspid There is mild calcification of the aortic valve. No aortic stenosis.  7. There is mild dilatation of the aortic root and ascending aorta.  8. No evidence of left ventricular regional wall motion abnormalities.  9. No complete TR doppler jet so unable to estimate PA systolic pressure. LA 74mm           ASSESSMENT:    1. S/P CABG x 4   2. Paroxysmal atrial fibrillation (HCC)   3. Acute on chronic diastolic CHF (congestive heart failure) ([place])   4. Essential hypertension   5. History of CVA (cerebrovascular accident)   6. Stage 3 chronic kidney disease, unspecified whether stage 3a or 3b CKD   7. Hyperlipidemia, unspecified hyperlipidemia type      PLAN:  In order of problems listed above:  1. CAD status post CABG times 42/11/20 for left main disease on aspirin and reduced beta-blocker because of bradycardia in the hospital.  Plavix stopped because he is on Eliquis for atrial fibrillation-no angina 2. Postop atrial fibrillation found on ILR longest episode 12 hours started on Eliquis for chads vascof 7-HR up to 115/m with minimal walking in the office. O2 sats stable 97-98%. Average rates on loop at rest 80's and now not walking much because he's so short of breath. Loop recorder checked in office. No afib events since Sept but have been limited on beta blocker titration with history of bradycardia. Will increase metoprolol to 37.5 mg BID and update echo to make sure no change in LVEF.  Have f/u in afib clinic.  3. Acute on Chronic diastolic CHF  on Lasix 60 mg daily . Very short of breath. Exam unremarkable for CHF today other than a trace of edema. Weight up 5 lbs. Lasix 80 mg for 2 days then back to 60 mg Kdur 30 meq 2 days then 20 meq daily. 4. essential hypertension blood pressure was elevated last office visit and Dr. Angelena Form increase his hydralazine to 100 mg 3 times daily.  Better today 5. History of cryptogenic stroke 2018 on Plavix and aspirin but Plavix  stopped when he was placed on Eliquis-no bleeding problems. 6. CKD no ACE inhibitor or ARB creatinine 1.84 on 06/09/2019 7. Hyperlipidemia LDL 80 -11/12/18 triglycerides 214    Addendum as we were checking patient out he says he just feels too poorly to go home and wants to go to the hospital.  He does not feel like he can walk to his car to get himself to the hospital so we will transport via EMS.  He can have the echo and full work-up over there for dyspnea and tachy- bradycardia   Medication Adjustments/Labs and Tests Ordered: Current medicines are reviewed at length with the patient today.  Concerns regarding medicines are outlined above.  Medication changes, Labs and Tests ordered today are listed in the Patient Instructions below. There are no Patient Instructions on file for this visit.   Sumner Boast, PA-C  08/27/2019 10:08 AM    [facility] [address], [place], [place]  [address] Phone: [phone]; Fax: [phone]

## 2019-08-27 ENCOUNTER — Inpatient Hospital Stay (HOSPITAL_COMMUNITY)
Admission: EM | Admit: 2019-08-27 | Discharge: 2019-08-30 | DRG: 377 | Disposition: A | Discharge status: Home / Self Care | Payer: Medicare Other | Attending: Internal Medicine | Admitting: Internal Medicine

## 2019-08-27 ENCOUNTER — Encounter: Payer: Self-pay | Admitting: Physician Assistant

## 2019-08-27 ENCOUNTER — Inpatient Hospital Stay (HOSPITAL_COMMUNITY): Payer: Medicare Other

## 2019-08-27 ENCOUNTER — Other Ambulatory Visit (HOSPITAL_COMMUNITY): Payer: Medicare Other

## 2019-08-27 ENCOUNTER — Emergency Department (HOSPITAL_COMMUNITY): Payer: Medicare Other

## 2019-08-27 ENCOUNTER — Ambulatory Visit (INDEPENDENT_AMBULATORY_CARE_PROVIDER_SITE_OTHER): Payer: Medicare Other | Admitting: Physician Assistant

## 2019-08-27 ENCOUNTER — Other Ambulatory Visit: Payer: Self-pay

## 2019-08-27 VITALS — BP 150/60 | HR 85 | Ht 67.0 in | Wt 203.4 lb

## 2019-08-27 DIAGNOSIS — K5520 Angiodysplasia of colon without hemorrhage: Secondary | ICD-10-CM

## 2019-08-27 DIAGNOSIS — N189 Chronic kidney disease, unspecified: Secondary | ICD-10-CM | POA: Diagnosis present

## 2019-08-27 DIAGNOSIS — I998 Other disorder of circulatory system: Secondary | ICD-10-CM | POA: Diagnosis not present

## 2019-08-27 DIAGNOSIS — K31811 Angiodysplasia of stomach and duodenum with bleeding: Principal | ICD-10-CM | POA: Diagnosis present

## 2019-08-27 DIAGNOSIS — E669 Obesity, unspecified: Secondary | ICD-10-CM | POA: Diagnosis present

## 2019-08-27 DIAGNOSIS — E1122 Type 2 diabetes mellitus with diabetic chronic kidney disease: Secondary | ICD-10-CM | POA: Diagnosis present

## 2019-08-27 DIAGNOSIS — K8020 Calculus of gallbladder without cholecystitis without obstruction: Secondary | ICD-10-CM | POA: Diagnosis present

## 2019-08-27 DIAGNOSIS — Z20828 Contact with and (suspected) exposure to other viral communicable diseases: Secondary | ICD-10-CM | POA: Diagnosis present

## 2019-08-27 DIAGNOSIS — E785 Hyperlipidemia, unspecified: Secondary | ICD-10-CM | POA: Diagnosis not present

## 2019-08-27 DIAGNOSIS — N183 Chronic kidney disease, stage 3 unspecified: Secondary | ICD-10-CM

## 2019-08-27 DIAGNOSIS — K7460 Unspecified cirrhosis of liver: Secondary | ICD-10-CM | POA: Diagnosis not present

## 2019-08-27 DIAGNOSIS — Z87891 Personal history of nicotine dependence: Secondary | ICD-10-CM | POA: Diagnosis not present

## 2019-08-27 DIAGNOSIS — D62 Acute posthemorrhagic anemia: Secondary | ICD-10-CM | POA: Diagnosis present

## 2019-08-27 DIAGNOSIS — Z794 Long term (current) use of insulin: Secondary | ICD-10-CM | POA: Diagnosis not present

## 2019-08-27 DIAGNOSIS — K31819 Angiodysplasia of stomach and duodenum without bleeding: Secondary | ICD-10-CM | POA: Diagnosis not present

## 2019-08-27 DIAGNOSIS — I130 Hypertensive heart and chronic kidney disease with heart failure and stage 1 through stage 4 chronic kidney disease, or unspecified chronic kidney disease: Secondary | ICD-10-CM | POA: Diagnosis present

## 2019-08-27 DIAGNOSIS — E119 Type 2 diabetes mellitus without complications: Secondary | ICD-10-CM

## 2019-08-27 DIAGNOSIS — Z823 Family history of stroke: Secondary | ICD-10-CM

## 2019-08-27 DIAGNOSIS — Z6831 Body mass index (BMI) 31.0-31.9, adult: Secondary | ICD-10-CM | POA: Diagnosis not present

## 2019-08-27 DIAGNOSIS — I480 Paroxysmal atrial fibrillation: Secondary | ICD-10-CM | POA: Diagnosis present

## 2019-08-27 DIAGNOSIS — R195 Other fecal abnormalities: Secondary | ICD-10-CM | POA: Diagnosis not present

## 2019-08-27 DIAGNOSIS — Z8673 Personal history of transient ischemic attack (TIA), and cerebral infarction without residual deficits: Secondary | ICD-10-CM

## 2019-08-27 DIAGNOSIS — I2510 Atherosclerotic heart disease of native coronary artery without angina pectoris: Secondary | ICD-10-CM | POA: Diagnosis present

## 2019-08-27 DIAGNOSIS — I6523 Occlusion and stenosis of bilateral carotid arteries: Secondary | ICD-10-CM | POA: Diagnosis present

## 2019-08-27 DIAGNOSIS — Z7901 Long term (current) use of anticoagulants: Secondary | ICD-10-CM | POA: Diagnosis not present

## 2019-08-27 DIAGNOSIS — R011 Cardiac murmur, unspecified: Secondary | ICD-10-CM | POA: Diagnosis present

## 2019-08-27 DIAGNOSIS — I5033 Acute on chronic diastolic (congestive) heart failure: Secondary | ICD-10-CM

## 2019-08-27 DIAGNOSIS — D649 Anemia, unspecified: Secondary | ICD-10-CM | POA: Diagnosis not present

## 2019-08-27 DIAGNOSIS — Z79899 Other long term (current) drug therapy: Secondary | ICD-10-CM

## 2019-08-27 DIAGNOSIS — I340 Nonrheumatic mitral (valve) insufficiency: Secondary | ICD-10-CM | POA: Diagnosis not present

## 2019-08-27 DIAGNOSIS — N1832 Chronic kidney disease, stage 3b: Secondary | ICD-10-CM | POA: Diagnosis present

## 2019-08-27 DIAGNOSIS — I248 Other forms of acute ischemic heart disease: Secondary | ICD-10-CM | POA: Diagnosis present

## 2019-08-27 DIAGNOSIS — R0602 Shortness of breath: Secondary | ICD-10-CM | POA: Diagnosis not present

## 2019-08-27 DIAGNOSIS — I10 Essential (primary) hypertension: Secondary | ICD-10-CM

## 2019-08-27 DIAGNOSIS — Z8249 Family history of ischemic heart disease and other diseases of the circulatory system: Secondary | ICD-10-CM

## 2019-08-27 DIAGNOSIS — N400 Enlarged prostate without lower urinary tract symptoms: Secondary | ICD-10-CM | POA: Diagnosis present

## 2019-08-27 DIAGNOSIS — D500 Iron deficiency anemia secondary to blood loss (chronic): Secondary | ICD-10-CM | POA: Diagnosis not present

## 2019-08-27 DIAGNOSIS — Z85038 Personal history of other malignant neoplasm of large intestine: Secondary | ICD-10-CM | POA: Diagnosis not present

## 2019-08-27 DIAGNOSIS — E1165 Type 2 diabetes mellitus with hyperglycemia: Secondary | ICD-10-CM | POA: Diagnosis not present

## 2019-08-27 DIAGNOSIS — I25810 Atherosclerosis of coronary artery bypass graft(s) without angina pectoris: Secondary | ICD-10-CM | POA: Diagnosis not present

## 2019-08-27 DIAGNOSIS — I5032 Chronic diastolic (congestive) heart failure: Secondary | ICD-10-CM | POA: Diagnosis present

## 2019-08-27 DIAGNOSIS — Z951 Presence of aortocoronary bypass graft: Secondary | ICD-10-CM

## 2019-08-27 DIAGNOSIS — Z833 Family history of diabetes mellitus: Secondary | ICD-10-CM

## 2019-08-27 DIAGNOSIS — D122 Benign neoplasm of ascending colon: Secondary | ICD-10-CM | POA: Diagnosis not present

## 2019-08-27 DIAGNOSIS — I5030 Unspecified diastolic (congestive) heart failure: Secondary | ICD-10-CM | POA: Diagnosis present

## 2019-08-27 DIAGNOSIS — D123 Benign neoplasm of transverse colon: Secondary | ICD-10-CM | POA: Diagnosis not present

## 2019-08-27 DIAGNOSIS — Z7982 Long term (current) use of aspirin: Secondary | ICD-10-CM

## 2019-08-27 DIAGNOSIS — K5521 Angiodysplasia of colon with hemorrhage: Secondary | ICD-10-CM | POA: Diagnosis not present

## 2019-08-27 DIAGNOSIS — I129 Hypertensive chronic kidney disease with stage 1 through stage 4 chronic kidney disease, or unspecified chronic kidney disease: Secondary | ICD-10-CM | POA: Diagnosis not present

## 2019-08-27 DIAGNOSIS — E782 Mixed hyperlipidemia: Secondary | ICD-10-CM | POA: Diagnosis not present

## 2019-08-27 DIAGNOSIS — C801 Malignant (primary) neoplasm, unspecified: Secondary | ICD-10-CM | POA: Diagnosis present

## 2019-08-27 DIAGNOSIS — K635 Polyp of colon: Secondary | ICD-10-CM | POA: Diagnosis present

## 2019-08-27 LAB — BASIC METABOLIC PANEL
Anion gap: 11 (ref 5–15)
BUN: 37 mg/dL — ABNORMAL HIGH (ref 8–23)
CO2: 19 mmol/L — ABNORMAL LOW (ref 22–32)
Calcium: 9.1 mg/dL (ref 8.9–10.3)
Chloride: 110 mmol/L (ref 98–111)
Creatinine, Ser: 1.91 mg/dL — ABNORMAL HIGH (ref 0.61–1.24)
GFR calc Af Amer: 39 mL/min — ABNORMAL LOW (ref >60)
GFR calc non Af Amer: 33 mL/min — ABNORMAL LOW (ref >60)
Glucose, Bld: 210 mg/dL — ABNORMAL HIGH (ref 70–99)
Potassium: 4.4 mmol/L (ref 3.5–5.1)
Sodium: 140 mmol/L (ref 135–145)

## 2019-08-27 LAB — IRON AND TIBC
Iron: 15 ug/dL — ABNORMAL LOW (ref 45–182)
Saturation Ratios: 3 % — ABNORMAL LOW (ref 17.9–39.5)
TIBC: 500 ug/dL — ABNORMAL HIGH (ref 250–450)
UIBC: 485 ug/dL

## 2019-08-27 LAB — CBC
HCT: 18.7 % — ABNORMAL LOW (ref 39.0–52.0)
HCT: 19.2 % — ABNORMAL LOW (ref 39.0–52.0)
Hemoglobin: 5.2 g/dL — CL (ref 13.0–17.0)
Hemoglobin: 5.7 g/dL — CL (ref 13.0–17.0)
MCH: 19.9 pg — ABNORMAL LOW (ref 26.0–34.0)
MCH: 21.7 pg — ABNORMAL LOW (ref 26.0–34.0)
MCHC: 27.8 g/dL — ABNORMAL LOW (ref 30.0–36.0)
MCHC: 29.7 g/dL — ABNORMAL LOW (ref 30.0–36.0)
MCV: 71.6 fL — ABNORMAL LOW (ref 80.0–100.0)
MCV: 73 fL — ABNORMAL LOW (ref 80.0–100.0)
Platelets: 335 10*3/uL (ref 150–400)
Platelets: 234 10*3/uL (ref 150–400)
RBC: 2.61 MIL/uL — ABNORMAL LOW (ref 4.22–5.81)
RBC: 2.63 MIL/uL — ABNORMAL LOW (ref 4.22–5.81)
RDW: 17.5 % — ABNORMAL HIGH (ref 11.5–15.5)
RDW: 19.2 % — ABNORMAL HIGH (ref 11.5–15.5)
WBC: 9.2 10*3/uL (ref 4.0–10.5)
WBC: 7.8 10*3/uL (ref 4.0–10.5)
nRBC: 0.4 % — ABNORMAL HIGH (ref 0.0–0.2)
nRBC: 0.6 % — ABNORMAL HIGH (ref 0.0–0.2)

## 2019-08-27 LAB — POC OCCULT BLOOD, ED: Fecal Occult Bld: POSITIVE — AB

## 2019-08-27 LAB — TROPONIN I (HIGH SENSITIVITY)
Troponin I (High Sensitivity): 1181 ng/L (ref <18)
Troponin I (High Sensitivity): 1320 ng/L (ref <18)

## 2019-08-27 LAB — GLUCOSE, CAPILLARY: Glucose-Capillary: 119 mg/dL — ABNORMAL HIGH (ref 70–99)

## 2019-08-27 LAB — RETICULOCYTES
Immature Retic Fract: 36.3 % — ABNORMAL HIGH (ref 2.3–15.9)
RBC.: 2.53 MIL/uL — ABNORMAL LOW (ref 4.22–5.81)
Retic Count, Absolute: 113.9 10*3/uL (ref 19.0–186.0)
Retic Ct Pct: 4.5 % — ABNORMAL HIGH (ref 0.4–3.1)

## 2019-08-27 LAB — FOLATE: Folate: 16.3 ng/mL (ref >5.9)

## 2019-08-27 LAB — PREPARE RBC (CROSSMATCH)

## 2019-08-27 LAB — SARS CORONAVIRUS 2 (TAT 6-24 HRS): SARS Coronavirus 2: NEGATIVE

## 2019-08-27 LAB — FERRITIN: Ferritin: 6 ng/mL — ABNORMAL LOW (ref 24–336)

## 2019-08-27 LAB — TSH: TSH: 1.445 u[IU]/mL (ref 0.350–4.500)

## 2019-08-27 LAB — VITAMIN B12: Vitamin B-12: 109 pg/mL — ABNORMAL LOW (ref 180–914)

## 2019-08-27 LAB — BRAIN NATRIURETIC PEPTIDE: B Natriuretic Peptide: 502.5 pg/mL — ABNORMAL HIGH (ref 0.0–100.0)

## 2019-08-27 LAB — CBG MONITORING, ED: Glucose-Capillary: 105 mg/dL — ABNORMAL HIGH (ref 70–99)

## 2019-08-27 MED ORDER — INSULIN ASPART 100 UNIT/ML ~~LOC~~ SOLN
0.0000 [IU] | Freq: Three times a day (TID) | SUBCUTANEOUS | Status: DC
  Administered 2019-08-30 (×2): 2 [IU] via SUBCUTANEOUS

## 2019-08-27 MED ORDER — VITAMIN B-12 1000 MCG PO TABS
1000.0000 ug | ORAL_TABLET | Freq: Every day | ORAL | Status: DC
  Administered 2019-08-27 – 2019-08-30 (×4): 1000 ug via ORAL
  Filled 2019-08-27 (×5): qty 1

## 2019-08-27 MED ORDER — ACETAMINOPHEN 325 MG PO TABS: 650.0000 mg | ORAL_TABLET | Freq: Four times a day (QID) | ORAL | Status: DC | PRN

## 2019-08-27 MED ORDER — ONDANSETRON HCL 4 MG PO TABS: 4.0000 mg | ORAL_TABLET | Freq: Four times a day (QID) | ORAL | Status: DC | PRN

## 2019-08-27 MED ORDER — SODIUM CHLORIDE 0.9 % IV SOLN
10.0000 mL/h | Freq: Once | INTRAVENOUS | Status: AC
  Administered 2019-08-27: 10 mL/h via INTRAVENOUS

## 2019-08-27 MED ORDER — ONDANSETRON HCL 4 MG/2ML IJ SOLN: 4.0000 mg | Freq: Four times a day (QID) | INTRAMUSCULAR | Status: DC | PRN

## 2019-08-27 MED ORDER — HYDROCODONE-ACETAMINOPHEN 5-325 MG PO TABS
1.0000 | ORAL_TABLET | ORAL | Status: DC | PRN
  Administered 2019-08-30: 1 via ORAL
  Filled 2019-08-27: qty 1
  Filled 2019-08-27: qty 2

## 2019-08-27 MED ORDER — POLYETHYLENE GLYCOL 3350 17 G PO PACK: 17.0000 g | PACK | Freq: Every day | ORAL | Status: DC | PRN

## 2019-08-27 MED ORDER — ACETAMINOPHEN 650 MG RE SUPP: 650.0000 mg | Freq: Four times a day (QID) | RECTAL | Status: DC | PRN

## 2019-08-27 MED ORDER — METOPROLOL TARTRATE 25 MG PO TABS: 37.5000 mg | ORAL_TABLET | Freq: Two times a day (BID) | ORAL | 3 refills | Status: DC

## 2019-08-27 MED ORDER — ALBUTEROL SULFATE HFA 108 (90 BASE) MCG/ACT IN AERS
1.0000 | INHALATION_SPRAY | RESPIRATORY_TRACT | Status: DC | PRN
  Filled 2019-08-27: qty 6.7

## 2019-08-27 MED ORDER — INSULIN ASPART 100 UNIT/ML ~~LOC~~ SOLN: 0.0000 [IU] | Freq: Every day | SUBCUTANEOUS | Status: DC

## 2019-08-27 MED ORDER — METOPROLOL TARTRATE 25 MG PO TABS
37.5000 mg | ORAL_TABLET | Freq: Two times a day (BID) | ORAL | Status: DC
  Administered 2019-08-27 – 2019-08-30 (×6): 37.5 mg via ORAL
  Filled 2019-08-27 (×6): qty 1

## 2019-08-27 MED ORDER — ROSUVASTATIN CALCIUM 20 MG PO TABS
20.0000 mg | ORAL_TABLET | Freq: Every day | ORAL | Status: DC
  Administered 2019-08-27 – 2019-08-29 (×3): 20 mg via ORAL
  Filled 2019-08-27 (×3): qty 1

## 2019-08-27 MED ORDER — INSULIN GLARGINE 100 UNIT/ML ~~LOC~~ SOLN
20.0000 [IU] | Freq: Every day | SUBCUTANEOUS | Status: DC
  Administered 2019-08-30: 20 [IU] via SUBCUTANEOUS
  Filled 2019-08-27 (×3): qty 0.2

## 2019-08-27 MED ORDER — SODIUM CHLORIDE 0.9% FLUSH
3.0000 mL | Freq: Two times a day (BID) | INTRAVENOUS | Status: DC
  Administered 2019-08-27 – 2019-08-30 (×6): 3 mL via INTRAVENOUS

## 2019-08-27 MED ORDER — PANTOPRAZOLE SODIUM 40 MG IV SOLR
40.0000 mg | Freq: Two times a day (BID) | INTRAVENOUS | Status: DC
  Administered 2019-08-27 – 2019-08-29 (×4): 40 mg via INTRAVENOUS
  Filled 2019-08-27 (×4): qty 40

## 2019-08-27 MED ORDER — ALBUTEROL SULFATE (2.5 MG/3ML) 0.083% IN NEBU: 2.5000 mg | INHALATION_SOLUTION | RESPIRATORY_TRACT | Status: DC | PRN

## 2019-08-27 MED ORDER — FUROSEMIDE 10 MG/ML IJ SOLN
40.0000 mg | Freq: Every day | INTRAMUSCULAR | Status: DC
  Administered 2019-08-27 – 2019-08-29 (×3): 40 mg via INTRAVENOUS
  Filled 2019-08-27 (×3): qty 4

## 2019-08-27 NOTE — Patient Instructions (Addendum)
  WE ARE SENDING YOU TO THE EMERGENCY ROOM  Follow-Up: Follow up in the Atrial Fibrillation Clinic with Adline Peals, PA on 09/03/19 at 9:30 AM  Any Other Special Instructions Will Be Listed Below (If Applicable).

## 2019-08-27 NOTE — ED Notes (Signed)
ED TO INPATIENT HANDOFF REPORT  ED Nurse Name and Phone #: Lorrin Goodell [phone]  S Name/Age/Gender Jake Gross 78 y.o. male Room/Bed: 018C/018C  Code Status   Code Status: Prior  Home/SNF/Other Home Patient oriented to: self, place, time and situation Is this baseline? Yes   Triage Complete: Triage complete  Chief Complaint sob  Triage Note Per [facility], pt from heart clinic across the street due to shob x 3 weeks. Hx of CABG x3. Denies CP, n/v or dizziness.    Allergies No Known Allergies  Level of Care/Admitting Diagnosis ED Disposition    ED Disposition Condition [facility] Area: [place] [[id number]]  Level of Care: Telemetry Cardiac [103]  Covid Evaluation: Asymptomatic Screening Protocol (No Symptoms)  Diagnosis: Symptomatic anemia [[id number]]  Admitting Physician: Alma Friendly [[id number]]  Attending Physician: Alma Friendly [[id number]]  Estimated length of stay: past midnight tomorrow  Certification:: I certify this patient will need inpatient services for at least 2 midnights  PT Class (Do Not Modify): Inpatient [101]  PT Acc Code (Do Not Modify): Private [1]       B Medical/Surgery History Past Medical History:  Diagnosis Date  . 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 Arthritis   . Cervical spondylosis   . Diabetes mellitus   . Hyperlipidemia   . Hypertension   . Stroke ([place]) 06/19/2017  . Vitamin D deficiency    Past Surgical History:  Procedure Laterality Date  . ABCESS DRAINAGE Left    buttocks  . CARDIOVASCULAR STRESS TEST  10/12/1999   EF 63%. NO ISCHEMIA  . COLONOSCOPY W/ POLYPECTOMY     5 polyps  . CORONARY ARTERY BYPASS GRAFT N/A 11/26/2018   Procedure: CORONARY ARTERY BYPASS GRAFTING (CABG), ON PUMP, TIMES FOUR, USING LEFT INTERNAL MAMMARY ARTERY AND ENDOSCOPICALLY HARVESTED LEFT SAPHENOUS VEIN;  Surgeon: Gaye Pollack, MD;  Location: [facility];   Service: Open Heart Surgery;  Laterality: N/A;  . EUS N/A 03/11/2015   Procedure: LOWER ENDOSCOPIC ULTRASOUND (EUS);  Surgeon: Milus Banister, MD;  Location: [facility];  Service: Endoscopy;  Laterality: N/A;  . FLEXIBLE SIGMOIDOSCOPY N/A 02/02/2015   Procedure: FLEXIBLE SIGMOIDOSCOPY;  Surgeon: Inda Castle, MD;  Location: [facility];  Service: Endoscopy;  Laterality: N/A;  ERBE  . LEFT HEART CATH AND CORONARY ANGIOGRAPHY N/A 11/20/2018   Procedure: LEFT HEART CATH AND CORONARY ANGIOGRAPHY;  Surgeon: Burnell Blanks, MD;  Location: [facility];  Service: Cardiovascular;  Laterality: N/A;  . LOOP RECORDER INSERTION N/A 08/14/2017   Procedure: LOOP RECORDER INSERTION;  Surgeon: Thompson Grayer, MD;  Location: [facility];  Service: Cardiovascular;  Laterality: N/A;  . PARTIAL PROCTECTOMY BY TEM N/A 04/08/2015   Procedure: TEM PARTIAL PROCTECTOMY OF RECTAL MASS;  Surgeon: Michael Boston, MD;  Location: [facility];  Service: General;  Laterality: N/A;  . TEE WITHOUT CARDIOVERSION N/A 06/25/2017   Procedure: TRANSESOPHAGEAL ECHOCARDIOGRAM (TEE);  Surgeon: Skeet Latch, MD;  Location: [facility];  Service: Cardiovascular;  Laterality: N/A;  . TEE WITHOUT CARDIOVERSION N/A 11/26/2018   Procedure: TRANSESOPHAGEAL ECHOCARDIOGRAM (TEE);  Surgeon: Gaye Pollack, MD;  Location: [facility];  Service: Open Heart Surgery;  Laterality: N/A;  . TONSILLECTOMY AND ADENOIDECTOMY     as child     A IV Location/Drains/Wounds Patient Lines/Drains/Airways Status   Active Line/Drains/Airways    Name:   Placement date:   Placement time:  Site:   Days:   Peripheral IV 08/27/19 Left Forearm   08/27/19    1335    Forearm   less than 1   Peripheral IV 08/27/19 Right Antecubital   08/27/19    1658    Antecubital   less than 1   Incision (Closed) 11/26/18 Leg Left   11/26/18    1004     274   Incision (Closed) 11/26/18 Chest Other (Comment)   11/26/18    1004     274          Intake/Output Last  24 hours  Intake/Output Summary (Last 24 hours) at 08/27/2019 1746 Last data filed at 08/27/2019 1537 Gross per 24 hour  Intake 500 ml  Output -  Net 500 ml    Labs/Imaging Results for orders placed or performed during the hospital encounter of 08/27/19 (from the past 48 hour(s))  Basic metabolic panel     Status: Abnormal   Collection Time: 08/27/19 11:42 AM  Result Value Ref Range   Sodium 140 135 - 145 mmol/L   Potassium 4.4 3.5 - 5.1 mmol/L   Chloride 110 98 - 111 mmol/L   CO2 19 (L) 22 - 32 mmol/L   Glucose, Bld 210 (H) 70 - 99 mg/dL   BUN 37 (H) 8 - 23 mg/dL   Creatinine, Ser 1.91 (H) 0.61 - 1.24 mg/dL   Calcium 9.1 8.9 - 10.3 mg/dL   GFR calc non Af Amer 33 (L) >60 mL/min   GFR calc Af Amer 39 (L) >60 mL/min   Anion gap 11 5 - 15    Comment: Performed at [facility], [address] [place] [address]  CBC     Status: Abnormal   Collection Time: 08/27/19 11:42 AM  Result Value Ref Range   WBC 9.2 4.0 - 10.5 K/uL   RBC 2.61 (L) 4.22 - 5.81 MIL/uL   Hemoglobin 5.2 (LL) 13.0 - 17.0 g/dL    Comment: Reticulocyte Hemoglobin testing may be clinically indicated, consider ordering this additional test [id number] THIS CRITICAL RESULT HAS VERIFIED AND BEEN CALLED TO K BROWN,RN BY DENNIS BRADLEY ON 11 11 2020 AT 1244, AND HAS BEEN READ BACK.  REPEATED TO VERIFY CORRECTED ON 11/11 AT 1435: PREVIOUSLY REPORTED AS 5.2 Reticulocyte Hemoglobin testing may be clinically indicated, consider ordering this additional test [id number] THIS CRITICAL RESULT HAS VERIFIED AND BEEN CALLED TO K BROWN,RN BY DENNIS BRADLEY ON 11 11  2020 AT 1244, AND HAS BEEN READ BACK.     HCT 18.7 (L) 39.0 - 52.0 %   MCV 71.6 (L) 80.0 - 100.0 fL   MCH 19.9 (L) 26.0 - 34.0 pg   MCHC 27.8 (L) 30.0 - 36.0 g/dL   RDW 17.5 (H) 11.5 - 15.5 %   Platelets 335 150 - 400 K/uL   nRBC 0.4 (H) 0.0 - 0.2 %    Comment: Performed at [facility] [address] [place], [place] [address]  Troponin I  (High Sensitivity)     Status: Abnormal   Collection Time: 08/27/19 11:42 AM  Result Value Ref Range   Troponin I (High Sensitivity) 1,181 (HH) <18 ng/L    Comment: CRITICAL RESULT CALLED TO, READ BACK BY AND VERIFIED WITHRevonda Humphrey RN [phone] BY A BENNETT (NOTE) Elevated high sensitivity troponin I (hsTnI) values and significant  changes across serial measurements may suggest ACS but many other  chronic and acute conditions are known to elevate hsTnI  results.  Refer to the Links section for chest pain algorithms and additional  guidance. Performed at [facility], [place] [address], [place], [place] [address]   Brain natriuretic peptide     Status: Abnormal   Collection Time: 08/27/19 11:42 AM  Result Value Ref Range   B Natriuretic Peptide 502.5 (H) 0.0 - 100.0 pg/mL    Comment: Performed at [facility] [address], [place], [place] [address]  Troponin I (High Sensitivity)     Status: Abnormal   Collection Time: 08/27/19  1:33 PM  Result Value Ref Range   Troponin I (High Sensitivity) 1,320 (HH) <18 ng/L    Comment: CRITICAL VALUE NOTED.  VALUE IS CONSISTENT WITH PREVIOUSLY REPORTED AND CALLED VALUE. (NOTE) Elevated high sensitivity troponin I (hsTnI) values and significant  changes across serial measurements may suggest ACS but many other  chronic and acute conditions are known to elevate hsTnI results.  Refer to the Links section for chest pain algorithms and additional  guidance. Performed at [facility], [place] [address], [place], [place] [address]   Type and screen [place]     Status: None (Preliminary result)   Collection Time: 08/27/19  1:37 PM  Result Value Ref Range   ABO/RH(D) O POS    Antibody Screen NEG    Sample Expiration 08/30/2019,2359    Unit Number [id number]    Blood Component Type RED CELLS,LR    Unit division 00    Status of Unit ALLOCATED    Transfusion Status OK TO TRANSFUSE    Crossmatch Result  Compatible    Unit Number [id number]    Blood Component Type RED CELLS,LR    Unit division 00    Status of Unit ISSUED    Transfusion Status OK TO TRANSFUSE    Crossmatch Result      Compatible Performed at [facility], [place] [address] [place], [place] [address]    Unit Number [id number]    Blood Component Type RED CELLS,LR    Unit division 00    Status of Unit ALLOCATED    Transfusion Status OK TO TRANSFUSE    Crossmatch Result Compatible   Prepare RBC     Status: None   Collection Time: 08/27/19  2:44 PM  Result Value Ref Range   Order Confirmation      ORDER PROCESSED BY BLOOD BANK Performed at [facility], [place] [address], [place], [place] [address]   Vitamin B12     Status: Abnormal   Collection Time: 08/27/19  3:19 PM  Result Value Ref Range   Vitamin B-12 109 (L) 180 - 914 pg/mL    Comment: (NOTE) This assay is not validated for testing neonatal or myeloproliferative syndrome specimens for Vitamin B12 levels. Performed at [facility], [place] [address], [place], [place] [address]   Iron and TIBC     Status: Abnormal   Collection Time: 08/27/19  3:19 PM  Result Value Ref Range   Iron 15 (L) 45 - 182 ug/dL   TIBC 500 (H) 250 - 450 ug/dL   Saturation Ratios 3 (L) 17.9 - 39.5 %   UIBC 485 ug/dL    Comment: Performed at [facility], [place] [address] [place],  [address]  Ferritin     Status: Abnormal   Collection Time: 08/27/19  3:19 PM  Result Value Ref Range   Ferritin 6 (L) 24 - 336 ng/mL  Comment: Performed at [facility], [place] [address], [place], [place] [address]  Reticulocytes     Status: Abnormal   Collection Time: 08/27/19  3:19 PM  Result Value Ref Range   Retic Ct Pct 4.5 (H) 0.4 - 3.1 %   RBC. 2.53 (L) 4.22 - 5.81 MIL/uL   Retic Count, Absolute 113.9 19.0 - 186.0 K/uL   Immature Retic Fract 36.3 (H) 2.3 - 15.9 %    Comment: Performed at [facility] [address], [place], [place] [address]  Folate      Status: None   Collection Time: 08/27/19  3:19 PM  Result Value Ref Range   Folate 16.3 >5.9 ng/mL    Comment: Performed at [facility], [place] [address] [place], [place] [address]  POC occult blood, ED Provider will collect     Status: Abnormal   Collection Time: 08/27/19  4:37 PM  Result Value Ref Range   Fecal Occult Bld POSITIVE (A) NEGATIVE  CBG monitoring, ED     Status: Abnormal   Collection Time: 08/27/19  5:11 PM  Result Value Ref Range   Glucose-Capillary 105 (H) 70 - 99 mg/dL   Dg Chest 2 View  Result Date: 08/27/2019 CLINICAL DATA:  Shortness of breath, 3 weeks duration. EXAM: CHEST - 2 VIEW COMPARISON:  01/01/2019 FINDINGS: Heart size remains enlarged following median sternotomy and CABG with cardiac loop recorder projecting over left cardiac border. Lungs are clear. Pulmonary vascular engorgement is present without frank edema. No signs of acute bone finding. IMPRESSION: Cardiomegaly and pulmonary vascular engorgement without frank edema. No focal infiltrate. Electronically Signed   By: Zetta Bills M.D.   On: 08/27/2019 12:03    Pending Labs Unresulted Labs (From admission, onward)    Start     Ordered   08/27/19 1648  TSH  Add-on,   AD     08/27/19 1647   08/27/19 1445  SARS CORONAVIRUS 2 (TAT 6-24 HRS) Nasopharyngeal Nasopharyngeal Swab  (Asymptomatic/Tier 2 Patients Labs)  Once,   STAT    Question Answer Comment  Is this test for diagnosis or screening Screening   Symptomatic for COVID-19 as defined by CDC No   Hospitalized for COVID-19 No   Admitted to ICU for COVID-19 No   Previously tested for COVID-19 No   Resident in a congregate (group) care setting No   Employed in healthcare setting No      08/27/19 1444   Signed and Held  Basic metabolic panel  Tomorrow morning,   R     Signed and Held   Signed and Held  CBC  Tomorrow morning,   R     Signed and Held   Signed and Held  Protime-INR  Tomorrow morning,   R     Signed and Held           Vitals/Pain Today's Vitals   08/27/19 1615 08/27/19 1630 08/27/19 1645 08/27/19 1700  BP: (!) 152/73 (!) 151/81 (!) 124/49 (!) 147/66  Pulse: 82 74 78 77  Resp: 16 16 17 12   Temp:      TempSrc:      SpO2: 99% 97% 97% 97%    Isolation Precautions No active isolations  Medications Medications  0.9 %  sodium chloride infusion (has no administration in time range)  metoprolol tartrate (LOPRESSOR) tablet 37.5 mg (has no administration in time range)  rosuvastatin (CRESTOR) tablet 20 mg (has no administration in time range)  furosemide (  LASIX) injection 40 mg (40 mg Intravenous Given 08/27/19 1704)  vitamin B-12 (CYANOCOBALAMIN) tablet 1,000 mcg (has no administration in time range)  insulin aspart (novoLOG) injection 0-9 Units (0 Units Subcutaneous Not Given 08/27/19 1713)  insulin aspart (novoLOG) injection 0-5 Units (has no administration in time range)  insulin glargine (LANTUS) injection 20 Units (has no administration in time range)    Mobility walks     Focused Assessments    R Recommendations: See Admitting Provider Note  Report given to:   Additional Notes:

## 2019-08-27 NOTE — ED Triage Notes (Signed)
Per [facility], pt from heart clinic across the street due to shob x 3 weeks. Hx of CABG x3. Denies CP, n/v or dizziness.

## 2019-08-27 NOTE — ED Provider Notes (Signed)
I received the patient in signout from Dr. Kathrynn Humble, briefly the patient is a 78 year old male with a chief complaint of shortness of breath on exertion found to have a hemoglobin of 5.3.  There is no complaints of dark stool or blood in his stool and rectal exam was with soft brown stool.  This unfortunately had not resulted in the computer and so I went back to the [facility] lab and they are having some trouble with the interface.  He was Hemoccult positive.   Deno Etienne, DO 08/27/19 1628

## 2019-08-27 NOTE — ED Notes (Signed)
Report given to Luellen Pucker, RN on [facility]

## 2019-08-27 NOTE — ED Provider Notes (Signed)
[facility] EMERGENCY DEPARTMENT Provider Note   CSN: [id number] Arrival date & time: 08/27/19  1122     History   Chief Complaint Chief Complaint  Patient presents with  . Shortness of Breath    HPI Ellijah S Gross is a 78 y.o. male.     HPI  78 year old comes in a chief complaint of shortness of breath.  He has history of colon cancer, diabetes, hypertension.  He reports that over the past several days he has been having exertional shortness of breath that has gotten worse this week.  Now walking about 10 yards get him short of breath.  He denies any associated chest pain.  He went to the cardiology clinic and was sent to the ER for further evaluation.  Patient does not have any underlying lung disease.  He denies any new cough, fevers, chills.  No sick contacts.  Past Medical History:  Diagnosis Date  . 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 Arthritis   . Cervical spondylosis   . Diabetes mellitus   . Hyperlipidemia   . Hypertension   . Stroke ([place]) 06/19/2017  . Vitamin D deficiency     Patient Active Problem List   Diagnosis Date Noted  . History of CVA (cerebrovascular accident) 08/25/2019  . Acute on chronic diastolic CHF (congestive heart failure) ([place]) 12/17/2018  . Paroxysmal atrial fibrillation ([place]) 12/17/2018  . CKD (chronic kidney disease) 12/17/2018  . 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 Unstable angina ([place])   . Hyperkalemia 03/25/2018  . AKI (acute kidney injury) ([place]) 03/25/2018  . Stroke (cerebrum) ([place]) 06/20/2017  . HLD (hyperlipidemia) 09/14/2015  . Essential hypertension   . 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 Arthritis 09/16/2012  . 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 ABCESS DRAINAGE Left    buttocks  . CARDIOVASCULAR STRESS TEST  10/12/1999   EF 63%. NO  ISCHEMIA  . COLONOSCOPY W/ POLYPECTOMY     5 polyps  . CORONARY ARTERY BYPASS GRAFT N/A 11/26/2018   Procedure: CORONARY ARTERY BYPASS GRAFTING (CABG), ON PUMP, TIMES FOUR, USING LEFT INTERNAL MAMMARY ARTERY AND ENDOSCOPICALLY HARVESTED LEFT SAPHENOUS VEIN;  Surgeon: Gaye Pollack, MD;  Location: [facility];  Service: Open Heart Surgery;  Laterality: N/A;  . EUS N/A 03/11/2015   Procedure: LOWER ENDOSCOPIC ULTRASOUND (EUS);  Surgeon: Milus Banister, MD;  Location: [facility];  Service: Endoscopy;  Laterality: N/A;  . FLEXIBLE SIGMOIDOSCOPY N/A 02/02/2015   Procedure: FLEXIBLE SIGMOIDOSCOPY;  Surgeon: Inda Castle, MD;  Location: [facility];  Service: Endoscopy;  Laterality: N/A;  ERBE  . LEFT HEART CATH AND CORONARY ANGIOGRAPHY N/A 11/20/2018   Procedure: LEFT HEART CATH AND CORONARY ANGIOGRAPHY;  Surgeon: Burnell Blanks, MD;  Location: [facility];  Service: Cardiovascular;  Laterality: N/A;  . LOOP RECORDER INSERTION N/A 08/14/2017   Procedure: LOOP RECORDER INSERTION;  Surgeon: Thompson Grayer, MD;  Location: [facility];  Service: Cardiovascular;  Laterality: N/A;  . PARTIAL PROCTECTOMY BY TEM N/A 04/08/2015   Procedure: TEM PARTIAL PROCTECTOMY OF RECTAL MASS;  Surgeon: Michael Boston, MD;  [facility];  Service: General;  Laterality: N/A;  . TEE WITHOUT CARDIOVERSION N/A 06/25/2017   Procedure: TRANSESOPHAGEAL ECHOCARDIOGRAM (TEE);  Surgeon: Skeet Latch, MD;  Location: [facility];  Service: Cardiovascular;  Laterality: N/A;  .  TEE WITHOUT CARDIOVERSION N/A 11/26/2018   Procedure: TRANSESOPHAGEAL ECHOCARDIOGRAM (TEE);  Surgeon: Gaye Pollack, MD;  Location: [facility];  Service: Open Heart Surgery;  Laterality: N/A;  . TONSILLECTOMY AND ADENOIDECTOMY     as child        Home Medications    Prior to Admission medications   Medication Sig Start Date End Date Taking? Authorizing Provider  Insulin Glargine (LANTUS SOLOSTAR) 100 UNIT/ML Solostar Pen Inject 60  Units into the skin daily. And pen needles 1/day 07/10/19  Yes Renato Shin, MD  amLODipine (NORVASC) 5 MG tablet Take 1 tablet (5 mg total) by mouth daily. 12/17/18   Imogene Burn, PA-C  apixaban (ELIQUIS) 5 MG TABS tablet Take 1 tablet (5 mg total) by mouth 2 (two) times daily. 01/14/19   Fenton, Clint R, PA  aspirin 81 MG tablet Take 1 tablet (81 mg total) by mouth daily. 12/02/18   Barrett, Erin R, PA-C  fenofibrate micronized (LOFIBRA) 67 MG capsule TAKE 1 CAPSULE DAILY BEFORE BREAKFAST 02/23/19   Jearld Fenton, NP  furosemide (LASIX) 40 MG tablet Take 60 mg by mouth daily.    [provider]  hydrALAZINE (APRESOLINE) 100 MG tablet Take 1 tablet (100 mg total) by mouth 3 (three) times daily. 06/09/19   Burnell Blanks, MD  isosorbide mononitrate (IMDUR) 60 MG 24 hr tablet Take 1 tablet (60 mg total) by mouth daily. 05/14/19   Imogene Burn, PA-C  metoprolol tartrate (LOPRESSOR) 25 MG tablet Take 1.5 tablets (37.5 mg total) by mouth 2 (two) times daily. 08/27/19   Imogene Burn, PA-C  ONE TOUCH ULTRA TEST test strip USE 1 STRIP TWICE A DAY 02/11/18   Renato Shin, MD  [facility] LANCETS 25K MISC  11/28/17   [provider]  potassium chloride (K-DUR) 10 MEQ tablet Take 20 mEq by mouth daily.    [provider]  rosuvastatin (CRESTOR) 20 MG tablet Take 1 tablet (20 mg total) by mouth daily. 08/14/19   Jearld Fenton, NP    Family History Family History  Problem Relation Age of Onset  . Cancer Father        "all over"  . Coronary artery disease Brother   . Diabetes Brother   . Stroke Mother   . Diabetes Mother   . Cancer Brother   . Colon cancer Neg Hx   . Esophageal cancer Neg Hx   . Rectal cancer Neg Hx   . Stomach cancer Neg Hx     Social History Social History   Tobacco Use  . Smoking status: Former Smoker    Types: Cigarettes    Quit date: 08/13/1994    Years since quitting: 25.0  . Smokeless tobacco: Never Used  Substance  Use Topics  . Alcohol use: No    Alcohol/week: 0.0 standard drinks  . Drug use: No     Allergies   Patient has no known allergies.   Review of Systems Review of Systems  Constitutional: Positive for activity change.  Respiratory: Positive for shortness of breath.   Cardiovascular: Negative for chest pain.  Gastrointestinal: Negative for blood in stool, nausea and vomiting.  Allergic/Immunologic: Negative for immunocompromised state.  Neurological: Negative for dizziness.  Hematological: Does not bruise/bleed easily.  All other systems reviewed and are negative.    Physical Exam Updated Vital Signs BP (!) 157/69   Pulse 78   Temp 98.1 F (36.7 C) (Oral)   Resp 15   SpO2 98%  Physical Exam Vitals signs and nursing note reviewed.  HENT:     Head: Atraumatic.  Eyes:     Conjunctiva/sclera: Conjunctivae normal.     Pupils: Pupils are equal, round, and reactive to light.  Neck:     Musculoskeletal: Normal range of motion and neck supple.  Cardiovascular:     Rate and Rhythm: Normal rate and regular rhythm.  Pulmonary:     Effort: Pulmonary effort is normal.     Breath sounds: Normal breath sounds. No decreased breath sounds, wheezing or rhonchi.  Abdominal:     General: Bowel sounds are normal. There is no distension.     Palpations: Abdomen is soft. There is no mass.     Tenderness: There is no abdominal tenderness. There is no guarding or rebound.  Musculoskeletal:        General: No deformity.  Skin:    General: Skin is warm.  Neurological:     Mental Status: He is alert and oriented to person, place, and time.      ED Treatments / Results  Labs (all labs ordered are listed, but only abnormal results are displayed) Labs Reviewed  BASIC METABOLIC PANEL - Abnormal; Notable for the following components:      Result Value   CO2 19 (*)    Glucose, Bld 210 (*)    BUN 37 (*)    Creatinine, Ser 1.91 (*)    GFR calc non Af Amer 33 (*)    GFR calc Af Amer  39 (*)    All other components within normal limits  CBC - Abnormal; Notable for the following components:   RBC 2.61 (*)    Hemoglobin 5.2 (*)    HCT 18.7 (*)    MCV 71.6 (*)    MCH 19.9 (*)    MCHC 27.8 (*)    RDW 17.5 (*)    nRBC 0.4 (*)    All other components within normal limits  BRAIN NATRIURETIC PEPTIDE - Abnormal; Notable for the following components:   B Natriuretic Peptide 502.5 (*)    All other components within normal limits  TROPONIN I (HIGH SENSITIVITY) - Abnormal; Notable for the following components:   Troponin I (High Sensitivity) 1,181 (*)    All other components within normal limits  TROPONIN I (HIGH SENSITIVITY) - Abnormal; Notable for the following components:   Troponin I (High Sensitivity) 1,320 (*)    All other components within normal limits  SARS CORONAVIRUS 2 (TAT 6-24 HRS)  VITAMIN B12  FOLATE  IRON AND TIBC  FERRITIN  RETICULOCYTES  POC OCCULT BLOOD, ED  TYPE AND SCREEN  PREPARE RBC (CROSSMATCH)    EKG EKG Interpretation  Date/Time:  Wednesday August 27 2019 11:34:41 EST Ventricular Rate:  82 PR Interval:  156 QRS Duration: 88 QT Interval:  390 QTC Calculation: 455 R Axis:   64 Text Interpretation: Normal sinus rhythm Cannot rule out Inferior infarct , age undetermined Cannot rule out Anterior infarct , age undetermined Abnormal ECG No acute changes No significant change since last tracing Confirmed by Varney Biles [phone]) on 08/27/2019 2:44:47 PM   Radiology Dg Chest 2 View  Result Date: 08/27/2019 CLINICAL DATA:  Shortness of breath, 3 weeks duration. EXAM: CHEST - 2 VIEW COMPARISON:  01/01/2019 FINDINGS: Heart size remains enlarged following median sternotomy and CABG with cardiac loop recorder projecting over left cardiac border. Lungs are clear. Pulmonary vascular engorgement is present without frank edema. No signs of acute bone finding. IMPRESSION: Cardiomegaly and  pulmonary vascular engorgement without frank edema. No focal  infiltrate. Electronically Signed   By: Zetta Bills M.D.   On: 08/27/2019 12:03    Procedures .Critical Care Performed by: Varney Biles, MD Authorized by: Varney Biles, MD   Critical care provider statement:    Critical care time (minutes):  32   Critical care was necessary to treat or prevent imminent or life-threatening deterioration of the following conditions: Symptomatic anemia.   Critical care was time spent personally by me on the following activities:  Discussions with consultants, evaluation of patient's response to treatment, examination of patient, ordering and performing treatments and interventions, ordering and review of laboratory studies, ordering and review of radiographic studies, pulse oximetry, re-evaluation of patient's condition, obtaining history from patient or surrogate and review of old charts   (including critical care time)  Medications Ordered in ED Medications  0.9 %  sodium chloride infusion (has no administration in time range)     Initial Impression / Assessment and Plan / ED Course  I have reviewed the triage vital signs and the nursing notes.  Pertinent labs & imaging results that were available during my care of the patient were reviewed by me and considered in my medical decision making (see chart for details).        78 year old comes in a chief complaint of shortness of breath.  He has been having exertional dyspnea over the last several days that has progressed.  He does not have any significant medical history from cardiovascular or lung perspective.  Hemoglobin however is noted to be less than 6.  Patient denies any bloody stools.  No melena, BRBPR . he has history of CKD, paroxysmal A. fib and is on Eliquis.  We will get anemia panel and admit patient for further work-up. He will need 3 units of transfusion.  He has no history of it.  Patient has given consent for the procedure.  He will need admission.   Final Clinical  Impressions(s) / ED Diagnoses   Final diagnoses:  Symptomatic anemia    ED Discharge Orders    None       Varney Biles, MD 08/27/19 1523

## 2019-08-27 NOTE — H&P (Addendum)
History and Physical  Jake Gross DOB: [birth year] DOA: 08/27/2019   Patient coming from: Home & is able to ambulate  Chief Complaint: Shortness of breath  HPI: Jake Gross is a 78 y.o. male with medical history significant for CAD status post CABG in 11/2018, hypertension, hyperlipidemia, CKD, CVA, A. fib, colon CA in 2016, diabetes mellitus, presents to the ED complaining of significant shortness of breath, generalized weakness for the past couple of weeks.  Patient was at his outpatient cardiology clinic today for follow-up appointment as patient has had difficulty with managing his diastolic heart failure as well as uncontrolled hypertension, was noted to be significantly short of breath, maintaining his sats above 95%.  Noted heart rate jumped to 115.  Due to persistent dyspnea, patient was advised to go to the ER via EMS.  Patient denied any associated chest pain, fever/chills, cough, abdominal pain, nausea/vomiting, hematemesis, melena.  Denies any sick contacts.  ED Course: Patient's vital signs were fairly stable, labs showed a hemoglobin of 5.2, baseline around 10, creatinine of 1.91, BNP of 502.5, troponin 1181-->1320, anemia panel showed iron of 15, TIBC 500, sats 3, ferritin 6 vitamin B12 109.  FOBT positive.  CT abdomen pelvis showed cirrhosis, cholelithiasis, chest x-ray showed cardiomegaly with pulmonary vascular engorgement, no focal infiltrates.  Cardiology consulted, will follow patient.  GI also consulted, will see patient.  Review of Systems: Review of systems are otherwise negative   Past Medical History:  Diagnosis Date  . 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 Arthritis   . Cervical spondylosis   . Diabetes mellitus   . Hyperlipidemia   . Hypertension   . Stroke ([place]) 06/19/2017  . Vitamin D deficiency    Past Surgical History:  Procedure Laterality Date  . ABCESS DRAINAGE Left    buttocks  . CARDIOVASCULAR STRESS TEST  10/12/1999   EF 63%. NO ISCHEMIA  . COLONOSCOPY W/ POLYPECTOMY     5 polyps  . CORONARY ARTERY BYPASS GRAFT N/A 11/26/2018   Procedure: CORONARY ARTERY BYPASS GRAFTING (CABG), ON PUMP, TIMES FOUR, USING LEFT INTERNAL MAMMARY ARTERY AND ENDOSCOPICALLY HARVESTED LEFT SAPHENOUS VEIN;  Surgeon: Gaye Pollack, MD;  Location: [facility];  Service: Open Heart Surgery;  Laterality: N/A;  . EUS N/A 03/11/2015   Procedure: LOWER ENDOSCOPIC ULTRASOUND (EUS);  Surgeon: Milus Banister, MD;  Location[facility];  Service: Endoscopy;  Laterality: N/A;  . FLEXIBLE SIGMOIDOSCOPY N/A 02/02/2015   Procedure: FLEXIBLE SIGMOIDOSCOPY;  Surgeon: Inda Castle, MD;  [facility];  Service: Endoscopy;  Laterality: N/A;  ERBE  . LEFT HEART CATH AND CORONARY ANGIOGRAPHY N/A 11/20/2018   Procedure: LEFT HEART CATH AND CORONARY ANGIOGRAPHY;  Surgeon: Burnell Blanks, MD;  Location: [facility];  Service: Cardiovascular;  Laterality: N/A;  . LOOP RECORDER INSERTION N/A 08/14/2017   Procedure: LOOP RECORDER INSERTION;  Surgeon: Thompson Grayer, MD;  Location: [facility];  Service: Cardiovascular;  Laterality: N/A;  . PARTIAL PROCTECTOMY BY TEM N/A 04/08/2015   Procedure: TEM PARTIAL PROCTECTOMY OF RECTAL MASS;  Surgeon: Michael Boston, MD;  [facility];  Service: General;  Laterality: N/A;  . TEE WITHOUT CARDIOVERSION N/A 06/25/2017   Procedure: TRANSESOPHAGEAL ECHOCARDIOGRAM (TEE);  Surgeon: Skeet Latch, MD;  Location: [facility];  Service: Cardiovascular;  Laterality: N/A;  . TEE WITHOUT CARDIOVERSION N/A 11/26/2018   Procedure: TRANSESOPHAGEAL ECHOCARDIOGRAM (TEE);  Surgeon: Gaye Pollack, MD;  Location:  [facility];  Service: Open Heart Surgery;  Laterality: N/A;  . TONSILLECTOMY AND ADENOIDECTOMY     as child    Social History:  reports that he quit smoking about 25 years ago. His smoking use included cigarettes. He has never used smokeless  tobacco. He reports that he does not drink alcohol or use drugs.   No Known Allergies  Family History  Problem Relation Age of Onset  . Cancer Father        "all over"  . Coronary artery disease Brother   . Diabetes Brother   . Stroke Mother   . Diabetes Mother   . Cancer Brother   . Colon cancer Neg Hx   . Esophageal cancer Neg Hx   . Rectal cancer Neg Hx   . Stomach cancer Neg Hx       Prior to Admission medications   Medication Sig Start Date End Date Taking? Authorizing Provider  amLODipine (NORVASC) 5 MG tablet Take 1 tablet (5 mg total) by mouth daily. 12/17/18  Yes Imogene Burn, PA-C  apixaban (ELIQUIS) 5 MG TABS tablet Take 1 tablet (5 mg total) by mouth 2 (two) times daily. 01/14/19  Yes Fenton, Clint R, PA  aspirin 81 MG tablet Take 1 tablet (81 mg total) by mouth daily. 12/02/18  Yes Barrett, Erin R, PA-C  fenofibrate micronized (LOFIBRA) 67 MG capsule TAKE 1 CAPSULE DAILY BEFORE BREAKFAST Patient taking differently: Take 67 mg by mouth daily before breakfast.  02/23/19  Yes Baity, Coralie Keens, NP  furosemide (LASIX) 40 MG tablet Take 60 mg by mouth daily. 1.5 tablet daily   Yes [provider]  hydrALAZINE (APRESOLINE) 100 MG tablet Take 1 tablet (100 mg total) by mouth 3 (three) times daily. 06/09/19  Yes Burnell Blanks, MD  Insulin Glargine (LANTUS SOLOSTAR) 100 UNIT/ML Solostar Pen Inject 60 Units into the skin daily. And pen needles 1/day 07/10/19  Yes Renato Shin, MD  isosorbide mononitrate (IMDUR) 60 MG 24 hr tablet Take 1 tablet (60 mg total) by mouth daily. 05/14/19  Yes Imogene Burn, PA-C  potassium chloride (K-DUR) 10 MEQ tablet Take 20 mEq by mouth daily.   Yes [provider]  rosuvastatin (CRESTOR) 20 MG tablet Take 1 tablet (20 mg total) by mouth daily. 08/14/19  Yes Jearld Fenton, NP  metoprolol tartrate (LOPRESSOR) 25 MG tablet Take 1.5 tablets (37.5 mg total) by mouth 2 (two) times daily. 08/27/19   Imogene Burn, PA-C   ONE TOUCH ULTRA TEST test strip USE 1 STRIP TWICE A DAY 02/11/18   Renato Shin, MD  [facility] LANCETS 16X [place]  11/28/17   [provider]    Physical Exam: BP (!) 138/58 (BP Location: Left Arm)   Pulse 86   Temp 98.4 F (36.9 C) (Oral)   Resp 16   SpO2 99%   General: NAD, chronically ill-appearing, alert, oriented x3 Eyes: Normal ENT: Normal Neck: Supple Cardiovascular: S1, S2 present Respiratory: Diminished breath sounds bilaterally Abdomen: Soft, nontender, nondistended, bowel sounds present Skin: Normal Musculoskeletal: Trace pedal edema bilaterally Psychiatric: Normal mood Neurologic: No focal neurologic deficits noted          Labs on Admission:  Basic Metabolic Panel: Recent Labs  Lab 08/27/19 1142  NA 140  K 4.4  CL 110  CO2 19*  GLUCOSE 210*  BUN 37*  CREATININE 1.91*  CALCIUM 9.1   Liver Function Tests: No results for input(s): AST, ALT, ALKPHOS, BILITOT, PROT, ALBUMIN in  the last 168 hours. No results for input(s): LIPASE, AMYLASE in the last 168 hours. No results for input(s): AMMONIA in the last 168 hours. CBC: Recent Labs  Lab 08/27/19 1142  WBC 9.2  HGB 5.2*  HCT 18.7*  MCV 71.6*  PLT 335   Cardiac Enzymes: No results for input(s): CKTOTAL, CKMB, CKMBINDEX, TROPONINI in the last 168 hours.  BNP (last 3 results) Recent Labs    08/27/19 1142  BNP 502.5*    ProBNP (last 3 results) Recent Labs    05/19/19 1300 06/02/19 1515 06/09/19 1144  PROBNP 744* 677* 775*    CBG: Recent Labs  Lab 08/27/19 1711  GLUCAP 105*    Radiological Exams on Admission: Ct Abdomen Pelvis Wo Contrast  Result Date: 08/27/2019 CLINICAL DATA:  GI bleed. EXAM: CT ABDOMEN AND PELVIS WITHOUT CONTRAST TECHNIQUE: Multidetector CT imaging of the abdomen and pelvis was performed following the standard protocol without IV contrast. COMPARISON:  CT scan dated 02/15/2015 FINDINGS: Lower chest: Extensive coronary artery calcification. Aortic  atherosclerosis. Small right pleural effusion. Hepatobiliary: Nodular liver parenchyma suggesting cirrhosis. The liver is smaller than on the prior study. Large calcified gallstone. Gallbladder wall is not thickened. No bile duct dilatation. Pancreas: Unremarkable. No pancreatic ductal dilatation or surrounding inflammatory changes. Spleen: Normal in size without focal abnormality. Adrenals/Urinary Tract: Adrenal glands and kidneys are normal. No hydronephrosis. Bladder is normal. Stomach/Bowel: Stomach is within normal limits. Appendix appears normal. No evidence of bowel wall thickening, distention, or inflammatory changes. Vascular/Lymphatic: Aortic atherosclerosis. No enlarged abdominal or pelvic lymph nodes. Reproductive: The prostate gland is enlarged, measuring 6.2 cm in diameter. Other: No abdominal wall hernia or abnormality. No abdominopelvic ascites. Musculoskeletal: No acute or significant osseous findings. IMPRESSION: 1. Small right pleural effusion. 2. Cirrhosis. 3. Cholelithiasis. 4. Enlarged prostate gland. 5. Aortic atherosclerosis. 6. No acute abnormalities. Aortic Atherosclerosis (ICD10-I70.0). Electronically Signed   By: Lorriane Shire M.D.   On: 08/27/2019 18:31   Dg Chest 2 View  Result Date: 08/27/2019 CLINICAL DATA:  Shortness of breath, 3 weeks duration. EXAM: CHEST - 2 VIEW COMPARISON:  01/01/2019 FINDINGS: Heart size remains enlarged following median sternotomy and CABG with cardiac loop recorder projecting over left cardiac border. Lungs are clear. Pulmonary vascular engorgement is present without frank edema. No signs of acute bone finding. IMPRESSION: Cardiomegaly and pulmonary vascular engorgement without frank edema. No focal infiltrate. Electronically Signed   By: Zetta Bills M.D.   On: 08/27/2019 12:03    EKG: Independently reviewed.  No acute ST changes  Assessment/Plan Present on Admission: . Symptomatic anemia . Essential hypertension . Adenocarcinoma in  adenomatous rectal polyp s/p TEM resection 04/08/2015 . HLD (hyperlipidemia) . CKD (chronic kidney disease) . Acute on chronic diastolic CHF (congestive heart failure) ([place]) . 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   HLD (hyperlipidemia)   S/P CABG x 4   Acute on chronic diastolic CHF (congestive heart failure) ([facility])   Paroxysmal atrial fibrillation ([facility])   CKD (chronic kidney disease)   History of CVA (cerebrovascular accident)  Possible upper GI bleed Hemoglobin 5.2 on presentation, baseline around 10 FOBT positive, patient on Eliquis History of colon CA in 2016 CT abdomen showed just cirrhosis, cholelithiasis GI consulted, will see patient Protonix twice daily Clear liquid diet Monitor closely  Acute on chronic blood loss anemia/iron  deficiency Hemoglobin 5.2 on presentation, baseline around 10 Anemia panel with iron 15, sats 3, TIBC 500, ferritin 6, vitamin B12 109 Transfuse 2 units of PRBC Serial CBC post transfusion, monitor closely Vitamin B12 supplementation  CKD stage III Creatinine stable at baseline Daily BMP  Acute on chronic diastolic HF Appears slightly overloaded, with trace edema Weight up about 5 pounds Echo done on 2/20 showed EF of 60 to 65%, impaired diastolic relaxation Start 40 IV Lasix daily, hold home p.o. Lasix Cardiology consulted Monitor BMP closely  Elevated troponin/history of CAD status post [id number] on 2/20 Currently chest pain-free Likely demand ischemia 2/2 symptomatic anemia EKG with no acute ST changes Hold aspirin Cardiology consulted Telemetry  Paroxysmal A. Fib Currently rate controlled EKG with sinus rhythm Continue metoprolol Hold Eliquis  Diabetes mellitus type 2 A1c 5.8 on 9/20 SSI, Lantus, hypoglycemic  protocol, Accu-Cheks  Hypertension Hold all BP meds due to significant anemia for now Hold amlodipine, hydralazine, Imdur Continue Lopressor  History of cryptogenic stroke in 2018 Continue to hold Eliquis  Hyperlipidemia Continue Crestor  History of Colon ca in 2016 Status post surgery Outpatient follow-up    DVT prophylaxis: SCD  Code Status: Full  Family Communication: None at bedside  Disposition Plan: To be determined  Consults called: Cardiology, GI  Admission status: Inpatient    Alma Friendly MD [facility]   If 7PM-7AM, please contact night-coverage [email or web address]   08/27/2019, 7:42 PM

## 2019-08-28 ENCOUNTER — Encounter (HOSPITAL_COMMUNITY): Payer: Self-pay | Admitting: *Deleted

## 2019-08-28 DIAGNOSIS — I5033 Acute on chronic diastolic (congestive) heart failure: Secondary | ICD-10-CM

## 2019-08-28 DIAGNOSIS — I248 Other forms of acute ischemic heart disease: Secondary | ICD-10-CM

## 2019-08-28 DIAGNOSIS — I480 Paroxysmal atrial fibrillation: Secondary | ICD-10-CM

## 2019-08-28 DIAGNOSIS — Z794 Long term (current) use of insulin: Secondary | ICD-10-CM

## 2019-08-28 DIAGNOSIS — E119 Type 2 diabetes mellitus without complications: Secondary | ICD-10-CM

## 2019-08-28 DIAGNOSIS — Z8673 Personal history of transient ischemic attack (TIA), and cerebral infarction without residual deficits: Secondary | ICD-10-CM

## 2019-08-28 DIAGNOSIS — D649 Anemia, unspecified: Secondary | ICD-10-CM

## 2019-08-28 DIAGNOSIS — D500 Iron deficiency anemia secondary to blood loss (chronic): Secondary | ICD-10-CM

## 2019-08-28 DIAGNOSIS — C801 Malignant (primary) neoplasm, unspecified: Secondary | ICD-10-CM

## 2019-08-28 DIAGNOSIS — N183 Chronic kidney disease, stage 3 unspecified: Secondary | ICD-10-CM

## 2019-08-28 DIAGNOSIS — Z7901 Long term (current) use of anticoagulants: Secondary | ICD-10-CM

## 2019-08-28 DIAGNOSIS — R195 Other fecal abnormalities: Secondary | ICD-10-CM

## 2019-08-28 LAB — CBC
HCT: 27.4 % — ABNORMAL LOW (ref 39.0–52.0)
HCT: 26.2 % — ABNORMAL LOW (ref 39.0–52.0)
HCT: 26.9 % — ABNORMAL LOW (ref 39.0–52.0)
Hemoglobin: 8.4 g/dL — ABNORMAL LOW (ref 13.0–17.0)
Hemoglobin: 8.1 g/dL — ABNORMAL LOW (ref 13.0–17.0)
Hemoglobin: 8.2 g/dL — ABNORMAL LOW (ref 13.0–17.0)
MCH: 23.2 pg — ABNORMAL LOW (ref 26.0–34.0)
MCH: 23.4 pg — ABNORMAL LOW (ref 26.0–34.0)
MCH: 23.2 pg — ABNORMAL LOW (ref 26.0–34.0)
MCHC: 30.7 g/dL (ref 30.0–36.0)
MCHC: 30.9 g/dL (ref 30.0–36.0)
MCHC: 30.5 g/dL (ref 30.0–36.0)
MCV: 75.7 fL — ABNORMAL LOW (ref 80.0–100.0)
MCV: 75.7 fL — ABNORMAL LOW (ref 80.0–100.0)
MCV: 76 fL — ABNORMAL LOW (ref 80.0–100.0)
Platelets: 246 10*3/uL (ref 150–400)
Platelets: 249 10*3/uL (ref 150–400)
Platelets: 259 10*3/uL (ref 150–400)
RBC: 3.62 MIL/uL — ABNORMAL LOW (ref 4.22–5.81)
RBC: 3.46 MIL/uL — ABNORMAL LOW (ref 4.22–5.81)
RBC: 3.54 MIL/uL — ABNORMAL LOW (ref 4.22–5.81)
RDW: 20.1 % — ABNORMAL HIGH (ref 11.5–15.5)
RDW: 19.9 % — ABNORMAL HIGH (ref 11.5–15.5)
RDW: 20.3 % — ABNORMAL HIGH (ref 11.5–15.5)
WBC: 9.4 10*3/uL (ref 4.0–10.5)
WBC: 10.8 10*3/uL — ABNORMAL HIGH (ref 4.0–10.5)
WBC: 10.8 10*3/uL — ABNORMAL HIGH (ref 4.0–10.5)
nRBC: 0.8 % — ABNORMAL HIGH (ref 0.0–0.2)
nRBC: 0.6 % — ABNORMAL HIGH (ref 0.0–0.2)
nRBC: 0.4 % — ABNORMAL HIGH (ref 0.0–0.2)

## 2019-08-28 LAB — GLUCOSE, CAPILLARY
Glucose-Capillary: 104 mg/dL — ABNORMAL HIGH (ref 70–99)
Glucose-Capillary: 78 mg/dL (ref 70–99)
Glucose-Capillary: 69 mg/dL — ABNORMAL LOW (ref 70–99)
Glucose-Capillary: 129 mg/dL — ABNORMAL HIGH (ref 70–99)
Glucose-Capillary: 84 mg/dL (ref 70–99)

## 2019-08-28 LAB — BASIC METABOLIC PANEL
Anion gap: 11 (ref 5–15)
BUN: 34 mg/dL — ABNORMAL HIGH (ref 8–23)
CO2: 24 mmol/L (ref 22–32)
Calcium: 8.9 mg/dL (ref 8.9–10.3)
Chloride: 107 mmol/L (ref 98–111)
Creatinine, Ser: 2.12 mg/dL — ABNORMAL HIGH (ref 0.61–1.24)
GFR calc Af Amer: 34 mL/min — ABNORMAL LOW (ref >60)
GFR calc non Af Amer: 29 mL/min — ABNORMAL LOW (ref >60)
Glucose, Bld: 68 mg/dL — ABNORMAL LOW (ref 70–99)
Potassium: 4 mmol/L (ref 3.5–5.1)
Sodium: 142 mmol/L (ref 135–145)

## 2019-08-28 LAB — PROTIME-INR
INR: 1.4 — ABNORMAL HIGH (ref 0.8–1.2)
Prothrombin Time: 16.7 seconds — ABNORMAL HIGH (ref 11.4–15.2)

## 2019-08-28 MED ORDER — POLYETHYLENE GLYCOL 3350 17 GM/SCOOP PO POWD
1.0000 | Freq: Once | ORAL | Status: AC
  Administered 2019-08-28: 255 g via ORAL
  Filled 2019-08-28 (×2): qty 255

## 2019-08-28 MED ORDER — SODIUM CHLORIDE 0.9 % IV SOLN
510.0000 mg | Freq: Once | INTRAVENOUS | Status: AC
  Administered 2019-08-28: 510 mg via INTRAVENOUS
  Filled 2019-08-28: qty 17

## 2019-08-28 NOTE — Progress Notes (Addendum)
PROGRESS NOTE  Jake Gross KWI:[id number] DOB: October 21, 1940 DOA: 08/27/2019 PCP: Jearld Fenton, NP  HPI/Recap of past 24 hours: Jake Gross is a 78 y.o. male with medical history significant for CAD status post CABG in 11/2018, hypertension, hyperlipidemia, CKD, CVA, A. fib, colon CA in 2016, diabetes mellitus, presents to the ED complaining of significant shortness of breath, generalized weakness for the past couple of weeks.  Patient was at his outpatient cardiology clinic for follow-up appointment as patient has had difficulty with managing his diastolic heart failure as well as uncontrolled hypertension, was noted to be significantly short of breath. Due to persistent dyspnea, patient was advised to go to the ER via EMS.  Patient denied any associated chest pain, fever/chills, cough, abdominal pain, nausea/vomiting, hematemesis, melena.  Denies any sick contacts. In the ED, labs showed a hemoglobin of 5.2, baseline around 10, creatinine of 1.91, BNP of 502.5, troponin 1181-->1320, anemia panel showed iron of 15, TIBC 500, sats 3, ferritin 6 vitamin B12 109.  FOBT positive.  CT abdomen pelvis showed cirrhosis, cholelithiasis, chest x-ray showed cardiomegaly with pulmonary vascular engorgement, no focal infiltrates.  Cardiology and GI consulted.    Today, patient denies any new complaints, reports still been slightly short of breath, with generalized weakness, although improved from yesterday.  Patient denies any chest pain, abdominal pain, nausea/vomiting, fever/chills.  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   HLD (hyperlipidemia)   S/P CABG x 4   Acute on chronic diastolic CHF (congestive heart failure) ([facility])   Paroxysmal atrial fibrillation ([facility])   CKD (chronic kidney disease)   History of  CVA (cerebrovascular accident)   Possible upper GI bleed Hemoglobin 5.2 on presentation, baseline around 10 FOBT positive, patient on Eliquis History of colon CA in 2016 CT abdomen showed cirrhosis, cholelithiasis GI consulted,  plan for EGD and colonoscopy on 11/13 Protonix twice daily Clear liquid diet Monitor closely  Acute on chronic blood loss anemia/iron deficiency Hemoglobin 5.2 on presentation, baseline around 10 Anemia panel with iron 15, sats 3, TIBC 500, ferritin 6, vitamin B12 109 Transfused 3 units of PRBC on 08/27/2019, with improvement in hemoglobin now 8.4 Serial CBC post transfusion, monitor closely Vitamin B12 supplementation We will give 1 dose of IV Feraheme on 08/28/2019  CKD stage III Baseline creatinine around 1.8-2.0 Currently around baseline Daily BMP  Acute on chronic diastolic HF Appears slightly overloaded, with trace edema on presentation Echo done on 2/20 showed EF of 60 to 65%, impaired diastolic relaxation Continue 40 IV Lasix daily, hold home p.o. Lasix Cardiology consulted, appreciate recs Monitor BMP closely  Elevated troponin/history of CAD status post [id number] on 2/20 Currently chest pain-free Likely demand ischemia 2/2 symptomatic anemia EKG with no acute ST changes Hold aspirin Cardiology consulted Telemetry  Paroxysmal A. Fib Currently rate controlled EKG with sinus rhythm Continue metoprolol Hold Eliquis  Diabetes mellitus type 2 A1c 5.8 on 9/20 SSI, Lantus, hypoglycemic protocol, Accu-Cheks  Hypertension Hold all BP meds due to significant anemia for now Hold amlodipine, hydralazine, Imdur Continue Lopressor  History of cryptogenic stroke in 2018 Continue to hold Eliquis  Hyperlipidemia Continue Crestor  History of Colon ca in 2016 Status post surgery Outpatient follow-up  Obesity Lifestyle modification advised         Malnutrition Type:      Malnutrition Characteristics:  Nutrition Interventions:       Estimated body mass index is 31.73 kg/m as calculated from the following:   Height as of this encounter: 5\' 7"  (1.702 m).   Weight as of this encounter: 91.9 kg.     Code Status: Full  Family Communication: None at bedside  Disposition Plan: To be determined   Consultants:  Cardiology  GI  Procedures:  None  Antimicrobials:  None  DVT prophylaxis: SCD   Objective: Vitals:   08/28/19 0159 08/28/19 0418 08/28/19 0440 08/28/19 0822  BP: (!) 101/46 (!) 114/57  132/68  Pulse: (!) 51 60  61  Resp: 16 16    Temp: 98.6 F (37 C) 98.5 F (36.9 C)  98.2 F (36.8 C)  TempSrc: Oral Oral  Oral  SpO2: 96% 95%  94%  Weight:   91.9 kg   Height:        Intake/Output Summary (Last 24 hours) at 08/28/2019 1511 Last data filed at 08/28/2019 0418 Gross per 24 hour  Intake 2730 ml  Output 900 ml  Net 1830 ml   Filed Weights   08/27/19 2120 08/28/19 0440  Weight: 90.7 kg 91.9 kg    Exam:  General: NAD   Cardiovascular: S1, S2 present  Respiratory:  Mild bibasilar crackles noted  Abdomen: Soft, nontender, nondistended, bowel sounds present  Musculoskeletal: No bilateral pedal edema noted  Skin: Normal  Psychiatry: Normal mood   Data Reviewed: CBC: Recent Labs  Lab 08/27/19 1142 08/27/19 2056 08/28/19 0640  WBC 9.2 7.8 9.4  HGB 5.2* 5.7* 8.4*  HCT 18.7* 19.2* 27.4*  MCV 71.6* 73.0* 75.7*  PLT 335 234 161   Basic Metabolic Panel: Recent Labs  Lab 08/27/19 1142 08/28/19 0640  NA 140 142  K 4.4 4.0  CL 110 107  CO2 19* 24  GLUCOSE 210* 68*  BUN 37* 34*  CREATININE 1.91* 2.12*  CALCIUM 9.1 8.9   GFR: Estimated Creatinine Clearance: 32 mL/min (A) (by C-G formula based on SCr of 2.12 mg/dL (H)). Liver Function Tests: No results for input(s): AST, ALT, ALKPHOS, BILITOT, PROT, ALBUMIN in the last 168 hours. No results for input(s): LIPASE, AMYLASE in the last 168 hours. No results for input(s):  AMMONIA in the last 168 hours. Coagulation Profile: Recent Labs  Lab 08/28/19 0640  INR 1.4*   Cardiac Enzymes: No results for input(s): CKTOTAL, CKMB, CKMBINDEX, TROPONINI in the last 168 hours. BNP (last 3 results) Recent Labs    05/19/19 1300 06/02/19 1515 06/09/19 1144  PROBNP 744* 677* 775*   HbA1C: No results for input(s): HGBA1C in the last 72 hours. CBG: Recent Labs  Lab 08/27/19 1711 08/27/19 2117 08/28/19 0815 08/28/19 1113  GLUCAP 105* 119* 104* 78   Lipid Profile: No results for input(s): CHOL, HDL, LDLCALC, TRIG, CHOLHDL, LDLDIRECT in the last 72 hours. Thyroid Function Tests: Recent Labs    08/27/19 1849  TSH 1.445   Anemia Panel: Recent Labs    08/27/19 1519  VITAMINB12 109*  FOLATE 16.3  FERRITIN 6*  TIBC 500*  IRON 15*  RETICCTPCT 4.5*   Urine analysis:    Component Value Date/Time   COLORURINE YELLOW 11/26/2018 0304   APPEARANCEUR HAZY (A) 11/26/2018 0304   LABSPEC 1.017 11/26/2018 0304   PHURINE 5.0 11/26/2018 0304   GLUCOSEU 150 (A) 11/26/2018 0304   HGBUR NEGATIVE 11/26/2018 0304   BILIRUBINUR NEGATIVE 11/26/2018 0304   KETONESUR NEGATIVE 11/26/2018 0304   PROTEINUR >=300 (A) 11/26/2018 0304   UROBILINOGEN 0.2  04/11/2015 1658   NITRITE NEGATIVE 11/26/2018 0304   LEUKOCYTESUR NEGATIVE 11/26/2018 0304   Sepsis Labs: @LABRCNTIP (procalcitonin:4,lacticidven:4)  ) Recent Results (from the past 240 hour(s))  SARS CORONAVIRUS 2 (TAT 6-24 HRS) Nasopharyngeal Nasopharyngeal Swab     Status: None   Collection Time: 08/27/19  3:25 PM   Specimen: Nasopharyngeal Swab  Result Value Ref Range Status   SARS Coronavirus 2 NEGATIVE NEGATIVE Final    Comment: (NOTE) SARS-CoV-2 target nucleic acids are NOT DETECTED. The SARS-CoV-2 RNA is generally detectable in upper and lower respiratory specimens during the acute phase of infection. Negative results do not preclude SARS-CoV-2 infection, do not rule out co-infections with other pathogens,  and should not be used as the sole basis for treatment or other patient management decisions. Negative results must be combined with clinical observations, patient history, and epidemiological information. The expected result is Negative. Fact Sheet for Patients: SugarRoll.be Fact Sheet for Healthcare Providers: https://www.woods-mathews.com/ This test is not yet approved or cleared by the [place] FDA and  has been authorized for detection and/or diagnosis of SARS-CoV-2 by FDA under an Emergency Use Authorization (EUA). This EUA will remain  in effect (meaning this test can be used) for the duration of the COVID-19 declaration under Section 56 4(b)(1) of the Act, 21 U.S.C. section 360bbb-3(b)(1), unless the authorization is terminated or revoked sooner. Performed at [facility], [place] [address], [place], [place] [address]       Studies: Ct Abdomen Pelvis Wo Contrast  Result Date: 08/27/2019 CLINICAL DATA:  GI bleed. EXAM: CT ABDOMEN AND PELVIS WITHOUT CONTRAST TECHNIQUE: Multidetector CT imaging of the abdomen and pelvis was performed following the standard protocol without IV contrast. COMPARISON:  CT scan dated 02/15/2015 FINDINGS: Lower chest: Extensive coronary artery calcification. Aortic atherosclerosis. Small right pleural effusion. Hepatobiliary: Nodular liver parenchyma suggesting cirrhosis. The liver is smaller than on the prior study. Large calcified gallstone. Gallbladder wall is not thickened. No bile duct dilatation. Pancreas: Unremarkable. No pancreatic ductal dilatation or surrounding inflammatory changes. Spleen: Normal in size without focal abnormality. Adrenals/Urinary Tract: Adrenal glands and kidneys are normal. No hydronephrosis. Bladder is normal. Stomach/Bowel: Stomach is within normal limits. Appendix appears normal. No evidence of bowel wall thickening, distention, or inflammatory changes. Vascular/Lymphatic:  Aortic atherosclerosis. No enlarged abdominal or pelvic lymph nodes. Reproductive: The prostate gland is enlarged, measuring 6.2 cm in diameter. Other: No abdominal wall hernia or abnormality. No abdominopelvic ascites. Musculoskeletal: No acute or significant osseous findings. IMPRESSION: 1. Small right pleural effusion. 2. Cirrhosis. 3. Cholelithiasis. 4. Enlarged prostate gland. 5. Aortic atherosclerosis. 6. No acute abnormalities. Aortic Atherosclerosis (ICD10-I70.0). Electronically Signed   By: Lorriane Shire M.D.   On: 08/27/2019 18:31    Scheduled Meds:  furosemide  40 mg Intravenous Daily   insulin aspart  0-5 Units Subcutaneous QHS   insulin aspart  0-9 Units Subcutaneous TID WC   insulin glargine  20 Units Subcutaneous Daily   metoprolol tartrate  37.5 mg Oral BID   pantoprazole (PROTONIX) IV  40 mg Intravenous Q12H   polyethylene glycol powder  1 Container Oral Once   rosuvastatin  20 mg Oral q1800   sodium chloride flush  3 mL Intravenous Q12H   vitamin B-12  1,000 mcg Oral Daily    Continuous Infusions:   LOS: 1 day     Alma Friendly, MD [facility]  If 7PM-7AM, please contact night-coverage [email or web address] 08/28/2019, 3:11 PM

## 2019-08-28 NOTE — Consult Note (Signed)
Cardiology Consultation:   Patient ID: Jake Gross MRN: [id number]; DOB: [birth year]  Admit date: 08/27/2019 Date of Consult: 08/28/2019  Primary Care Provider: Jearld Fenton, NP Primary Cardiologist: Lauree Chandler, MD  Primary Electrophysiologist:  Thompson Grayer, MD    Patient Profile:   Jake Gross is a 78 y.o. male with a history of CAD with severe left main disease s/p CABG in 11/2018, stroke in 10/2195, chronic diastolic CHF, paroxysmal atrial fibrillation on Eliquis, hypertension, hyperlipidemia, diabetes, mellitus, CKD stage III-IV who is being seen today for the evaluation of elevated troponin and acute on chronic CHF at the request of Dr. Horris Latino .  History of Present Illness:   [name] is a 78 year old male with the above history who is followed by Dr. Angelena Form and Dr. Rayann Heman.  Patient seen in our office yesterday by Jake Husk, PA-C, at which time he was very short of breath with little activity.  He reported worsening shortness of breath over the last 3 weeks and reported being awoken by shortness of breath the night before.  No chest pain or edema.  He was ambulated in the office and he became very dyspneic and heart rate jumped to 150 bpm.  Patient reported he felt too poorly to go home and wanted to go to the hospital.  He did not feel like he could get himself to the hospital; therefore, he was transported to the hospital via EMS.  Upon arrival to the ED, BP mildly elevated but vital stable. SPO2 98% on room air.  EKG showed normal sinus rhythm with no acute ST/T changes. High-sensitivity troponin elevated at 1,180 >> 1,320. BNP elevated at 502.5. Chest x-ray showed cardiomegaly and pulmonary vascular engorgement without frank edema. CT abdomen/pelvis showed small right pleural effusion, cirrhosis, cholelithiasis, and enlarged prostate gland but no acute abnormalities. WBC 9.2, Hgb 5.2, Plts 335. Na 140, K 4.4, Glucose 210, BUN 37, Scr 1.91. Hemoccult  positive. Patient was admitted with acute anemia and GI bleed. He received 3 units of PRCBs. Eliquis was held and GI was consulted.  Cardiology consulted for elevated troponin and acute on chronic CHF.  At the time of this evaluation, patient resting comfortably in bed.  Patient reports shortness of dyspnea with exertion over the last 4 weeks but states it has significantly worsened over the last 2 weeks.  He reports he gets very short of breath with walking only 5 to 10 yards.  He states he can normally sleep flat at night with no problems; however, 2 nights ago he woke up around 2:30 AM significantly short of breath.  Shortness of breath resolved after sitting on the side of the bed for a while.  He denies any chest pain or lower extremity edema.  He denies any palpitations but states he can hear his heart beating in his ears.  He denies any lightheadedness, dizziness, syncope.  No recent fevers or illnesses.  He had dark stools this morning but otherwise denies any hematochezia, melena, hematuria.  He has received 3 units of blood so far.  He has not been up ambulating yet so he is not sure how much his symptoms have improved.  However, he is breathing comfortably at rest.  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 NO ISCHEMIA   COLONOSCOPY W/ POLYPECTOMY     5 polyps   CORONARY ARTERY BYPASS GRAFT N/A 11/26/2018   Procedure: CORONARY ARTERY BYPASS GRAFTING (CABG), ON PUMP, TIMES FOUR, USING LEFT INTERNAL MAMMARY ARTERY AND ENDOSCOPICALLY HARVESTED LEFT SAPHENOUS VEIN;  Surgeon: Gaye Pollack, MD;  Location: [facility];  Service: Open Heart Surgery;  Laterality: N/A;   EUS N/A 03/11/2015   Procedure: LOWER ENDOSCOPIC ULTRASOUND (EUS);  Surgeon: Milus Banister, MD;  Location: [facility];  Service: Endoscopy;  Laterality: N/A;   FLEXIBLE SIGMOIDOSCOPY N/A 02/02/2015   Procedure: FLEXIBLE SIGMOIDOSCOPY;  Surgeon: Inda Castle, MD;  Location: [facility];  Service: Endoscopy;  Laterality: N/A;  ERBE   LEFT HEART CATH AND CORONARY ANGIOGRAPHY N/A 11/20/2018   Procedure: LEFT HEART CATH AND CORONARY ANGIOGRAPHY;  Surgeon: Burnell Blanks, MD;  Location: [facility];  Service: Cardiovascular;  Laterality: N/A;   LOOP RECORDER INSERTION N/A 08/14/2017   Procedure: LOOP RECORDER INSERTION;  Surgeon: Thompson Grayer, MD;  Location: [facility];  Service: Cardiovascular;  Laterality: N/A;   PARTIAL PROCTECTOMY BY TEM N/A 04/08/2015   Procedure: TEM PARTIAL PROCTECTOMY OF RECTAL MASS;  Surgeon: Michael Boston, MD;  Location: [facility];  Service: General;  Laterality: N/A;   TEE WITHOUT CARDIOVERSION N/A 06/25/2017   Procedure: TRANSESOPHAGEAL ECHOCARDIOGRAM (TEE);  Surgeon: Skeet Latch, MD;  Location: [facility];  Service: Cardiovascular;  Laterality: N/A;   TEE WITHOUT CARDIOVERSION N/A 11/26/2018   Procedure: TRANSESOPHAGEAL ECHOCARDIOGRAM (TEE);  Surgeon: Gaye Pollack, MD;  Location: [facility];  Service: Open Heart Surgery;  Laterality: N/A;   TONSILLECTOMY AND ADENOIDECTOMY     as child     Home Medications:  Prior to Admission medications   Medication Sig Start Date End Date Taking? Authorizing Provider  amLODipine (NORVASC) 5 MG tablet Take 1 tablet (5 mg total) by mouth daily. 12/17/18  Yes Imogene Burn, PA-C  apixaban (ELIQUIS) 5 MG TABS tablet Take 1 tablet (5 mg total) by mouth 2 (two) times daily. 01/14/19  Yes Fenton, Clint R, PA  aspirin 81 MG tablet Take 1 tablet (81 mg total) by mouth daily. 12/02/18  Yes Barrett, Erin R, PA-C  fenofibrate micronized  (LOFIBRA) 67 MG capsule TAKE 1 CAPSULE DAILY BEFORE BREAKFAST Patient taking differently: Take 67 mg by mouth daily before breakfast.  02/23/19  Yes Baity, Coralie Keens, NP  furosemide (LASIX) 40 MG tablet Take 60 mg by mouth daily. 1.5 tablet daily   Yes [provider]  hydrALAZINE (APRESOLINE) 100 MG tablet Take 1 tablet (100 mg total) by mouth 3 (three) times daily. 06/09/19  Yes Burnell Blanks, MD  Insulin Glargine (LANTUS SOLOSTAR) 100 UNIT/ML Solostar Pen Inject 60 Units into the skin daily. And pen needles 1/day 07/10/19  Yes Renato Shin, MD  isosorbide mononitrate (IMDUR) 60 MG 24 hr tablet Take 1 tablet (60 mg total) by mouth daily. 05/14/19  Yes Imogene Burn, PA-C  potassium chloride (K-DUR) 10 MEQ tablet Take 20 mEq by mouth daily.   Yes [provider]  rosuvastatin (CRESTOR) 20 MG tablet Take 1 tablet (20 mg total) by mouth daily. 08/14/19  Yes Jearld Fenton, NP  metoprolol tartrate (LOPRESSOR) 25 MG  tablet Take 1.5 tablets (37.5 mg total) by mouth 2 (two) times daily. 08/27/19   Imogene Burn, PA-C  ONE TOUCH ULTRA TEST test strip USE 1 STRIP TWICE A DAY 02/11/18   Renato Shin, MD  [facility] DELICA LANCETS 17E MISC  11/28/17   [provider]    Inpatient Medications: Scheduled Meds:  furosemide  40 mg Intravenous Daily   insulin aspart  0-5 Units Subcutaneous QHS   insulin aspart  0-9 Units Subcutaneous TID WC   insulin glargine  20 Units Subcutaneous Daily   metoprolol tartrate  37.5 mg Oral BID   pantoprazole (PROTONIX) IV  40 mg Intravenous Q12H   rosuvastatin  20 mg Oral q1800   sodium chloride flush  3 mL Intravenous Q12H   vitamin B-12  1,000 mcg Oral Daily   Continuous Infusions:  PRN Meds: acetaminophen **OR** acetaminophen, albuterol, HYDROcodone-acetaminophen, ondansetron **OR** ondansetron (ZOFRAN) IV, polyethylene glycol  Allergies:   No Known Allergies  Social History:   Social History   Socioeconomic  History   Marital status: Divorced    Spouse name: Not on file   Number of children: 2   Years of education: Not on file   Highest education level: Not on file  Occupational History   Occupation: retired    Fish farm manager: [employer]  Social Designer, fashion/clothing strain: Not on file   Food insecurity    Worry: Not on file    Inability: Not on file   Transportation needs    Medical: Not on file    Non-medical: Not on file  Tobacco Use   Smoking status: Former Smoker    Types: Cigarettes    Quit date: 08/13/1994    Years since quitting: 25.0   Smokeless tobacco: Never Used  Substance and Sexual Activity   Alcohol use: No    Alcohol/week: 0.0 standard drinks   Drug use: No   Sexual activity: Not Currently    Birth control/protection: None  Lifestyle   Physical activity    Days per week: Not on file    Minutes per session: Not on file   Stress: Not on file  Relationships   Social connections    Talks on phone: Not on file    Gets together: Not on file    Attends religious service: Not on file    Active member of club or organization: Not on file    Attends meetings of clubs or organizations: Not on file    Relationship status: Not on file   Intimate partner violence    Fear of current or ex partner: Not on file    Emotionally abused: Not on file    Physically abused: Not on file    Forced sexual activity: Not on file  Other Topics Concern   Not on file  Social History Narrative   Not on file    Family History:    Family History  Problem Relation Age of Onset   Cancer Father        "all over"   Coronary artery disease Brother    Diabetes Brother    Stroke Mother    Diabetes Mother    Cancer Brother    Colon cancer Neg Hx    Esophageal cancer Neg Hx    Rectal cancer Neg Hx    Stomach cancer Neg Hx      ROS:  Please see the history of present illness.  Review of Systems  Constitutional: Negative for fever.  Respiratory: Positive  for shortness of breath. Negative for cough.   Cardiovascular: Positive for PND. Negative for chest pain, palpitations, orthopnea and leg swelling.  Gastrointestinal: Positive for melena. Negative for blood in stool, nausea and vomiting.  Genitourinary: Negative for hematuria.  Musculoskeletal: Negative for falls.  Neurological: Positive for weakness. Negative for dizziness and loss of consciousness.  Endo/Heme/Allergies: Does not bruise/bleed easily.   All other ROS reviewed and negative.     Physical Exam/Data:   Vitals:   08/28/19 0159 08/28/19 0418 08/28/19 0440 08/28/19 0822  BP: (!) 101/46 (!) 114/57  132/68  Pulse: (!) 51 60  61  Resp: 16 16    Temp: 98.6 F (37 C) 98.5 F (36.9 C)  98.2 F (36.8 C)  TempSrc: Oral Oral  Oral  SpO2: 96% 95%  94%  Weight:   91.9 kg   Height:        Intake/Output Summary (Last 24 hours) at 08/28/2019 1018 Last data filed at 08/28/2019 0418 Gross per 24 hour  Intake 2730 ml  Output 900 ml  Net 1830 ml   Last 3 Weights 08/28/2019 08/27/2019 08/27/2019  Weight (lbs) 202 lb 9.6 oz 199 lb 15.3 oz 203 lb 6.4 oz  Weight (kg) 91.9 kg 90.7 kg 92.262 kg     Body mass index is 31.73 kg/m.  General: 78 y.o. male resting comfortably in no acute distress. HEENT: Normocephalic and atraumatic. Sclera clear. EOMs intact. Neck: Supple. Left carotid bruit noted. No JVD. Heart: RRR. Distinct S1 and S2. III/VI systolic murmur at apex. No gallops or rubs. Radial and distal pedal pulses 2+ and equal bilaterally. Lungs: No increased work of breathing. Clear to ausculation bilaterally. No wheezes, rhonchi, or rales.  Abdomen: Soft, non-distended, and non-tender to palpation. Bowel sounds present. MSK: Normal strength and tone for age. Extremities: No clubbing, cyanosis, or edema.    Skin: Warm and dry. Neuro: Alert and oriented x3. No focal deficits. Psych: Normal affect. Responds appropriately.   EKG:  The EKG was personally reviewed and  demonstrates: Normal sinus rhythm, rate 82 bpm, with inferior Q waves and non-specific ST/T changes.  Telemetry:  Telemetry was personally reviewed and demonstrates:  Normal sinus rhythm/sinus bradycardia with rates in the mid 50's to 70's. Borderline 1st degree AV block with a few missed beats noted.  Relevant CV Studies:  Left Heart Catheterization 11/20/2018:   Prox RCA to Mid RCA lesion is 30% stenosed.  RPDA lesion is 99% stenosed.  Post Atrio lesion is 100% stenosed.  Ost 2nd Diag to 2nd Diag lesion is 99% stenosed.  Prox LAD lesion is 95% stenosed.  Ost 1st Diag lesion is 80% stenosed.  Ost Cx to Prox Cx lesion is 70% stenosed.  Ost LM lesion is 70% stenosed.   1. Moderately severe ostial left main stenosis.  2. Severe stenosis in the heavily calcified mid LAD 3. Moderately severe proximal Circumflex stenosis 4. The RCA has mild calcific disease through the proximal and mid segments. The PDA is a moderate caliber vessel with severe stenosis. 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  5. 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 I will ask CT surgery to see him to discuss CABG. Given chronic kidney disease, I will admit him for hydration. Will hold Plavix. He will need Plavix washout prior to surgery. Will arrange echo tomorrow to assess LVEF. Will start ASA. Continue beta blocker and statin. Given elevated filling  pressures, one dose of IV Lasix tonight.   Echocardiogram 11/21/2018: Impressions: 1. The left ventricle has normal systolic function of 77-41%. The cavity size was mildly increased. There is no increased left ventricular wall thickness. Echo evidence of impaired diastolic relaxation.  2. The right ventricle has normal systolic function. The cavity was normal. There is no increase in right ventricular wall thickness.  3. Right atrial size was mildly dilated.  4. The mitral valve  is normal in structure. There is mild to moderate mitral annular calcification present. No evidence of mitral valve stenosis. No mitral regurgitation.  5. The tricuspid valve is normal in structure.  6. The aortic valve is tricuspid There is mild calcification of the aortic valve. No aortic stenosis.  7. There is mild dilatation of the aortic root and ascending aorta.  8. No evidence of left ventricular regional wall motion abnormalities.  9. No complete TR doppler jet so unable to estimate PA systolic pressure.  Laboratory Data:  High Sensitivity Troponin:   Recent Labs  Lab 08/27/19 1142 08/27/19 1333  TROPONINIHS 1,181* 1,320*     Chemistry Recent Labs  Lab 08/27/19 1142 08/28/19 0640  NA 140 142  K 4.4 4.0  CL 110 107  CO2 19* 24  GLUCOSE 210* 68*  BUN 37* 34*  CREATININE 1.91* 2.12*  CALCIUM 9.1 8.9  GFRNONAA 33* 29*  GFRAA 39* 34*  ANIONGAP 11 11    No results for input(s): PROT, ALBUMIN, AST, ALT, ALKPHOS, BILITOT in the last 168 hours. Hematology Recent Labs  Lab 08/27/19 1142 08/27/19 1519 08/27/19 2056 08/28/19 0640  WBC 9.2  --  7.8 9.4  RBC 2.61* 2.53* 2.63* 3.62*  HGB 5.2*  --  5.7* 8.4*  HCT 18.7*  --  19.2* 27.4*  MCV 71.6*  --  73.0* 75.7*  MCH 19.9*  --  21.7* 23.2*  MCHC 27.8*  --  29.7* 30.7  RDW 17.5*  --  19.2* 20.1*  PLT 335  --  234 246   BNP Recent Labs  Lab 08/27/19 1142  BNP 502.5*    DDimer No results for input(s): DDIMER in the last 168 hours.   Radiology/Studies:  Ct Abdomen Pelvis Wo Contrast  Result Date: 08/27/2019 CLINICAL DATA:  GI bleed. EXAM: CT ABDOMEN AND PELVIS WITHOUT CONTRAST TECHNIQUE: Multidetector CT imaging of the abdomen and pelvis was performed following the standard protocol without IV contrast. COMPARISON:  CT scan dated 02/15/2015 FINDINGS: Lower chest: Extensive coronary artery calcification. Aortic atherosclerosis. Small right pleural effusion. Hepatobiliary: Nodular liver parenchyma suggesting  cirrhosis. The liver is smaller than on the prior study. Large calcified gallstone. Gallbladder wall is not thickened. No bile duct dilatation. Pancreas: Unremarkable. No pancreatic ductal dilatation or surrounding inflammatory changes. Spleen: Normal in size without focal abnormality. Adrenals/Urinary Tract: Adrenal glands and kidneys are normal. No hydronephrosis. Bladder is normal. Stomach/Bowel: Stomach is within normal limits. Appendix appears normal. No evidence of bowel wall thickening, distention, or inflammatory changes. Vascular/Lymphatic: Aortic atherosclerosis. No enlarged abdominal or pelvic lymph nodes. Reproductive: The prostate gland is enlarged, measuring 6.2 cm in diameter. Other: No abdominal wall hernia or abnormality. No abdominopelvic ascites. Musculoskeletal: No acute or significant osseous findings. IMPRESSION: 1. Small right pleural effusion. 2. Cirrhosis. 3. Cholelithiasis. 4. Enlarged prostate gland. 5. Aortic atherosclerosis. 6. No acute abnormalities. Aortic Atherosclerosis (ICD10-I70.0). Electronically Signed   By: Lorriane Shire M.D.   On: 08/27/2019 18:31   Dg Chest 2 View  Result Date: 08/27/2019 CLINICAL DATA:  Shortness of  breath, 3 weeks duration. EXAM: CHEST - 2 VIEW COMPARISON:  01/01/2019 FINDINGS: Heart size remains enlarged following median sternotomy and CABG with cardiac loop recorder projecting over left cardiac border. Lungs are clear. Pulmonary vascular engorgement is present without frank edema. No signs of acute bone finding. IMPRESSION: Cardiomegaly and pulmonary vascular engorgement without frank edema. No focal infiltrate. Electronically Signed   By: Zetta Bills M.D.   On: 08/27/2019 12:03    Assessment and Plan:   Acute on Chronic Diastolic CHF - Patient presents with worsening dyspnea on activity over the last 2 weeks.  No edema.  Weight up 5 pounds from 05/2019. - BNP elevated in the 500s. - Chest x-ray showed cardiomegaly with vascular congestion  but no overt edema. - Repeat Echo ordered. Murmur noted on exam so Echo will help evaluate this as well. - Started on IV Lasix 40 mg daily yesterday.  Documented urinary output 900 mL yesterday net positive 1.8 L with transfusions. Weight up 3 lbs since admission.  Creatinine slightly up from yesterday. -Patient does not appear significantly volume overloaded on exam.  Therefore, we will keep patient on current dose of Lasix. However, if creatinine continues to rise, may need need to hold Lasix. - Continue to monitor daily weights, strict I/O's, and renal function.  Elevated Troponin - High-sentivity troponin elevated at 1,181 >> 1,320. - EKG shows no acute ST/T changes compared to prior tracings. - No angina.  - Suspect demand ischemia in setting of acute anemia with hemoglobin of 5 and possible acute on chronic CHF. No ischemic work-up planned at this time.   CAD s/p CABG - No angina. - Continue medical therapy.  Paroxysmal Atrial Fibrillation - Currently in normal sinus rhythm.  - Continue home Lopressor 37.5mg  twice daily. - CHA2DS2-VASc = 8 (CAD, CHF, CVA x2,  HTN, DM, age x2). Home Eliquis being held due to acute anemia and presumed GI bleed. Will defer to GI about when it is safe to resume.  Carotid Artery Disease - Pre-CABG dopplers in 11/2018 showed 1-39% stenosis of bilateral ICAs. - Left bruit noted on exam. - Can follow-up as outpatient.   Acute Anemia / Presumed Upper GI Bleed - Hemoglobin 5.2 on admission. Hemoccult positive. Patient reports dark stools this morning but otherwise no abnormal bleeding.  - S/p 3 units of PRBCs. Repeat hemoglobin 8.4 this morning. - GI has been consulted.  Continue to hold Eliquis till bleed is localized and controlled.  Hypertension - BP currently well controlled but has been as high as 182/71 since admission. - Home medications include Amlodipine 5mg  daily, Lopressor 37.5mg  twice daily, Hydralazine 100mg  three times daily, and Imdur 60mg   daily. - Lopressor was continued but all other medications held on admission due to significant GI bleed. - Continue to monitor. Can add back home meds as needed.  Hyperlipidemia  - Continue home Crestor 20mg  daily.  Diabetes Mellitus - Continue sliding scale per primary team.  CKD Stage III-IV - Creatinine 1.91 on admission. Baseline around 1.6 to 1.8.  - Creatinine up to 2.12 today. Possible pre-renal due to acute anemia. - Continue daily BMET.  Otherwise, per primary team.    For questions or updates, please contact [facility] [place] Please consult [email or web address] for contact info under     Signed, Darreld Mclean, PA-C  08/28/2019 10:18 AM

## 2019-08-28 NOTE — Anesthesia Preprocedure Evaluation (Addendum)
Anesthesia Evaluation  Patient identified by MRN, date of birth, ID band Patient awake    Reviewed: Allergy & Precautions, NPO status , Patient's Chart, lab work & pertinent test results  Airway Mallampati: II  TM Distance: >3 FB Neck ROM: Full    Dental no notable dental hx. (+) Edentulous Upper, Edentulous Lower   Pulmonary former smoker,    Pulmonary exam normal breath sounds clear to auscultation       Cardiovascular Exercise Tolerance: Good hypertension, Pt. on medications + CABG  Normal cardiovascular exam Rhythm:Regular Rate:Normal     Neuro/Psych TIAnegative psych ROS   GI/Hepatic negative GI ROS, Neg liver ROS,   Endo/Other  diabetes  Renal/GU K+ 4.0 Cr 2.12     Musculoskeletal  (+) Arthritis ,   Abdominal (+) + obese,   Peds  Hematology Hgb 8.1 Plt 249   Anesthesia Other Findings   Reproductive/Obstetrics                            Anesthesia Physical Anesthesia Plan  ASA: III  Anesthesia Plan: MAC   Post-op Pain Management:    Induction:   PONV Risk Score and Plan: Treatment may vary due to age or medical condition  Airway Management Planned: Nasal Cannula and Natural Airway  Additional Equipment: None  Intra-op Plan:   Post-operative Plan:   Informed Consent: I have reviewed the patients History and Physical, chart, labs and discussed the procedure including the risks, benefits and alternatives for the proposed anesthesia with the patient or authorized representative who has indicated his/her understanding and acceptance.       Plan Discussed with: CRNA  Anesthesia Plan Comments: (EGD colon )       Anesthesia Quick Evaluation

## 2019-08-28 NOTE — Consult Note (Addendum)
Referring Provider: No ref. provider found Primary Care Physician:  Jearld Fenton, NP Primary Gastroenterologist:  Dr. Deatra Ina previously  Reason for Consultation:  IDA  HPI: Jake Gross Face is a 78 y.o. male with past medical history of coronary artery disease status post CABG in February 2020, hypertension, hyperlipidemia, diabetes mellitus and paroxysmal atrial fibrillation on Eliquis.  Yesterday he was in the cardiology office for an appointment and was very short of breath with minimal ambulation.  They found his hemoglobin to be 5.2 g so he was transferred to the emergency department via EMS.  This hemoglobin is compared to February of this year which time was 10.5 g.  His iron studies are low with a ferritin of 6, serum iron 15, iron saturation 3%.  This AM it is 8.4 grams after 3 units of PRBC's.  CT abdomen and pelvis without contrast showed the following:  IMPRESSION: 1. Small right pleural effusion. 2. Cirrhosis. 3. Cholelithiasis. 4. Enlarged prostate gland. 5. Aortic atherosclerosis. 6. No acute abnormalities.  Aortic Atherosclerosis (ICD10-I70.0).  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  No GI follow-up since then.  Denies any bloody stools.  Says that they were black this AM but has not really paid much attention otherwise to see if they have been black at home.   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[facility])    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   Arthritis    Cervical spondylosis    Diabetes mellitus    Hyperlipidemia    Hypertension    Stroke ([place]) 06/19/2017   Vitamin D deficiency     Past Surgical History:  Procedure Laterality Date   ABCESS DRAINAGE Left    buttocks   CARDIOVASCULAR STRESS TEST  10/12/1999   EF 63%. NO ISCHEMIA   COLONOSCOPY W/ POLYPECTOMY     5 polyps   CORONARY ARTERY BYPASS GRAFT N/A 11/26/2018   Procedure:  CORONARY ARTERY BYPASS GRAFTING (CABG), ON PUMP, TIMES FOUR, USING LEFT INTERNAL MAMMARY ARTERY AND ENDOSCOPICALLY HARVESTED LEFT SAPHENOUS VEIN;  Surgeon: Gaye Pollack, MD;  Location: [place];  Service: Open Heart Surgery;  Laterality: N/A;   EUS N/A 03/11/2015   Procedure: LOWER ENDOSCOPIC ULTRASOUND (EUS);  Surgeon: Milus Banister, MD;  Location: [facility];  Service: Endoscopy;  Laterality: N/A;   FLEXIBLE SIGMOIDOSCOPY N/A 02/02/2015   Procedure: FLEXIBLE SIGMOIDOSCOPY;  Surgeon: Inda Castle, MD;  Location: [facility];  Service: Endoscopy;  Laterality: N/A;  ERBE   LEFT HEART CATH AND CORONARY ANGIOGRAPHY N/A 11/20/2018   Procedure: LEFT HEART CATH AND CORONARY ANGIOGRAPHY;  Surgeon: Burnell Blanks, MD;  Location: [place] [facility];  Service: Cardiovascular;  Laterality: N/A;   LOOP RECORDER INSERTION N/A 08/14/2017   Procedure: LOOP RECORDER INSERTION;  Surgeon: Thompson Grayer, MD;  Location: [place] [facility];  Service: Cardiovascular;  Laterality: N/A;   PARTIAL PROCTECTOMY BY TEM N/A 04/08/2015   Procedure: TEM PARTIAL PROCTECTOMY OF RECTAL MASS;  Surgeon: Michael Boston, MD;  Location: [facility];  Service: General;  Laterality: N/A;   TEE WITHOUT CARDIOVERSION N/A 06/25/2017   Procedure: TRANSESOPHAGEAL ECHOCARDIOGRAM (TEE);  Surgeon: Skeet Latch, MD;  Location: [place];  Service: Cardiovascular;  Laterality: N/A;   TEE WITHOUT CARDIOVERSION N/A 11/26/2018   Procedure: TRANSESOPHAGEAL ECHOCARDIOGRAM (TEE);  Surgeon: Gaye Pollack, MD;  Location: [place];  Service: Open Heart Surgery;  Laterality: N/A;   TONSILLECTOMY  AND ADENOIDECTOMY     as child    Prior to Admission medications   Medication Sig Start Date End Date Taking? Authorizing Provider  amLODipine (NORVASC) 5 MG tablet Take 1 tablet (5 mg total) by mouth daily. 12/17/18  Yes Imogene Burn, PA-C  apixaban (ELIQUIS) 5 MG TABS tablet Take 1 tablet (5 mg total) by mouth 2 (two) times daily. 01/14/19   Yes Fenton, Clint R, PA  aspirin 81 MG tablet Take 1 tablet (81 mg total) by mouth daily. 12/02/18  Yes Barrett, Erin R, PA-C  fenofibrate micronized (LOFIBRA) 67 MG capsule TAKE 1 CAPSULE DAILY BEFORE BREAKFAST Patient taking differently: Take 67 mg by mouth daily before breakfast.  02/23/19  Yes Baity, Coralie Keens, NP  furosemide (LASIX) 40 MG tablet Take 60 mg by mouth daily. 1.5 tablet daily   Yes [provider]  hydrALAZINE (APRESOLINE) 100 MG tablet Take 1 tablet (100 mg total) by mouth 3 (three) times daily. 06/09/19  Yes Burnell Blanks, MD  Insulin Glargine (LANTUS SOLOSTAR) 100 UNIT/ML Solostar Pen Inject 60 Units into the skin daily. And pen needles 1/day 07/10/19  Yes Renato Shin, MD  isosorbide mononitrate (IMDUR) 60 MG 24 hr tablet Take 1 tablet (60 mg total) by mouth daily. 05/14/19  Yes Imogene Burn, PA-C  potassium chloride (K-DUR) 10 MEQ tablet Take 20 mEq by mouth daily.   Yes [provider]  rosuvastatin (CRESTOR) 20 MG tablet Take 1 tablet (20 mg total) by mouth daily. 08/14/19  Yes Jearld Fenton, NP  metoprolol tartrate (LOPRESSOR) 25 MG tablet Take 1.5 tablets (37.5 mg total) by mouth 2 (two) times daily. 08/27/19   Imogene Burn, PA-C  ONE TOUCH ULTRA TEST test strip USE 1 STRIP TWICE A DAY 02/11/18   Renato Shin, MD  [facility] [address] [place]  11/28/17   [provider]    Current Facility-Administered Medications  Medication Dose Route Frequency Provider Last Rate Last Dose   acetaminophen (TYLENOL) tablet 650 mg  650 mg Oral Q6H PRN Alma Friendly, MD       Or   acetaminophen (TYLENOL) suppository 650 mg  650 mg Rectal Q6H PRN Alma Friendly, MD       albuterol (VENTOLIN HFA) 108 (90 Base) MCG/ACT inhaler 1 puff  1 puff Inhalation Q2H PRN Alma Friendly, MD       furosemide (LASIX) injection 40 mg  40 mg Intravenous Daily Alma Friendly, MD   40 mg at 08/28/19 0816   HYDROcodone-acetaminophen  (NORCO/VICODIN) 5-325 MG per tablet 1-2 tablet  1-2 tablet Oral Q4H PRN Alma Friendly, MD       insulin aspart (novoLOG) injection 0-5 Units  0-5 Units Subcutaneous QHS Alma Friendly, MD       insulin aspart (novoLOG) injection 0-9 Units  0-9 Units Subcutaneous TID WC Alma Friendly, MD       insulin glargine (LANTUS) injection 20 Units  20 Units Subcutaneous Daily Alma Friendly, MD       metoprolol tartrate (LOPRESSOR) tablet 37.5 mg  37.5 mg Oral BID Alma Friendly, MD   37.5 mg at 08/28/19 0816   ondansetron (ZOFRAN) tablet 4 mg  4 mg Oral Q6H PRN Alma Friendly, MD       Or   ondansetron [facility]) injection 4 mg  4 mg Intravenous Q6H PRN Alma Friendly, MD       pantoprazole (PROTONIX) injection 40 mg  40 mg Intravenous Q12H Alma Friendly, MD   40 mg at 08/28/19 0816   polyethylene glycol (MIRALAX / GLYCOLAX) packet 17 g  17 g Oral Daily PRN Alma Friendly, MD       rosuvastatin (CRESTOR) tablet 20 mg  20 mg Oral q1800 Alma Friendly, MD   20 mg at 08/27/19 2121   sodium chloride flush (NS) 0.9 % injection 3 mL  3 mL Intravenous Q12H Alma Friendly, MD   3 mL at 08/28/19 3151   vitamin B-12 (CYANOCOBALAMIN) tablet 1,000 mcg  1,000 mcg Oral Daily Alma Friendly, MD   1,000 mcg at 08/28/19 0816    Allergies as of 08/27/2019   (No Known Allergies)    Family History  Problem Relation Age of Onset   Cancer Father        "all over"   Coronary artery disease Brother    Diabetes Brother    Stroke Mother    Diabetes Mother    Cancer Brother    Colon cancer Neg Hx    Esophageal cancer Neg Hx    Rectal cancer Neg Hx    Stomach cancer Neg Hx     Social History   Socioeconomic History   Marital status: Divorced    Spouse name: Not on file   Number of children: 2   Years of education: Not on file   Highest education level: Not on file  Occupational History   Occupation: retired     Fish farm manager: [employer]  Social Needs   Financial resource strain: Not on file   Food insecurity    Worry: Not on file    Inability: Not on file   Transportation needs    Medical: Not on file    Non-medical: Not on file  Tobacco Use   Smoking status: Former Smoker    Types: Cigarettes    Quit date: 08/13/1994    Years since quitting: 25.0   Smokeless tobacco: Never Used  Substance and Sexual Activity   Alcohol use: No    Alcohol/week: 0.0 standard drinks   Drug use: No   Sexual activity: Not Currently    Birth control/protection: None  Lifestyle   Physical activity    Days per week: Not on file    Minutes per session: Not on file   Stress: Not on file  Relationships   Social connections    Talks on phone: Not on file    Gets together: Not on file    Attends religious service: Not on file    Active member of club or organization: Not on file    Attends meetings of clubs or organizations: Not on file    Relationship status: Not on file   Intimate partner violence    Fear of current or ex partner: Not on file    Emotionally abused: Not on file    Physically abused: Not on file    Forced sexual activity: Not on file  Other Topics Concern   Not on file  Social History Narrative   Not on file    Review of Systems: ROS is O/W negative except as mentioned   Physical Exam: Vital signs in last 24 hours: Temp:  [98 F (36.7 C)-98.7 F (37.1 C)] 98.2 F (36.8 C) (11/12 0822) Pulse Rate:  [51-89] 61 (11/12 0822) Resp:  [12-21] 16 (11/12 0418) BP: (101-182)/(46-82) 132/68 (11/12 0822) SpO2:  [94 %-99 %] 94 % (11/12 0822) Weight:  [90.7 kg-92.3 kg] 91.9  kg (11/12 0440) Last BM Date: 08/27/19(at 7am prior to admission) General:  Alert, Well-developed, well-nourished, pleasant and cooperative in NAD Head:  Normocephalic and atraumatic. Eyes:  Sclera clear, no icterus.  Conjunctiva pink. Ears:  Normal auditory acuity. Mouth:  No deformity or lesions.   Lungs:  Clear  throughout to auscultation.  No wheezes, crackles, or rhonchi.  Heart:  Regular rate and rhythm; murmur noted. Abdomen:  Soft, non-distended.  BS present.  Non-tender. Rectal:  Deferred.  Brown heme positive stool in the ED.  Msk:  Symmetrical without gross deformities. Pulses:  Normal pulses noted. Extremities:  Without clubbing or edema. Neurologic:  Alert and oriented x 4;  grossly normal neurologically. Skin:  Intact without significant lesions or rashes. Psych:  Alert and cooperative. Normal mood and affect.  Intake/Output from previous day: 11/11 0701 - 11/12 0700 In: 2730 [P.O.:360; I.V.:1040; Blood:1330] Out: 900 [Urine:900]  Lab Results: Recent Labs    08/27/19 1142 08/27/19 2056 08/28/19 0640  WBC 9.2 7.8 9.4  HGB 5.2* 5.7* 8.4*  HCT 18.7* 19.2* 27.4*  PLT 335 234 246   BMET Recent Labs    08/27/19 1142 08/28/19 0640  NA 140 142  K 4.4 4.0  CL 110 107  CO2 19* 24  GLUCOSE 210* 68*  BUN 37* 34*  CREATININE 1.91* 2.12*  CALCIUM 9.1 8.9   PT/INR Recent Labs    08/28/19 0640  LABPROT 16.7*  INR 1.4*   Studies/Results: Ct Abdomen Pelvis Wo Contrast  Result Date: 08/27/2019 CLINICAL DATA:  GI bleed. EXAM: CT ABDOMEN AND PELVIS WITHOUT CONTRAST TECHNIQUE: Multidetector CT imaging of the abdomen and pelvis was performed following the standard protocol without IV contrast. COMPARISON:  CT scan dated 02/15/2015 FINDINGS: Lower chest: Extensive coronary artery calcification. Aortic atherosclerosis. Small right pleural effusion. Hepatobiliary: Nodular liver parenchyma suggesting cirrhosis. The liver is smaller than on the prior study. Large calcified gallstone. Gallbladder wall is not thickened. No bile duct dilatation. Pancreas: Unremarkable. No pancreatic ductal dilatation or surrounding inflammatory changes. Spleen: Normal in size without focal abnormality. Adrenals/Urinary Tract: Adrenal glands and kidneys are normal. No hydronephrosis. Bladder is normal.  Stomach/Bowel: Stomach is within normal limits. Appendix appears normal. No evidence of bowel wall thickening, distention, or inflammatory changes. Vascular/Lymphatic: Aortic atherosclerosis. No enlarged abdominal or pelvic lymph nodes. Reproductive: The prostate gland is enlarged, measuring 6.2 cm in diameter. Other: No abdominal wall hernia or abnormality. No abdominopelvic ascites. Musculoskeletal: No acute or significant osseous findings. IMPRESSION: 1. Small right pleural effusion. 2. Cirrhosis. 3. Cholelithiasis. 4. Enlarged prostate gland. 5. Aortic atherosclerosis. 6. No acute abnormalities. Aortic Atherosclerosis (ICD10-I70.0). Electronically Signed   By: Lorriane Shire M.D.   On: 08/27/2019 18:31   Dg Chest 2 View  Result Date: 08/27/2019 CLINICAL DATA:  Shortness of breath, 3 weeks duration. EXAM: CHEST - 2 VIEW COMPARISON:  01/01/2019 FINDINGS: Heart size remains enlarged following median sternotomy and CABG with cardiac loop recorder projecting over left cardiac border. Lungs are clear. Pulmonary vascular engorgement is present without frank edema. No signs of acute bone finding. IMPRESSION: Cardiomegaly and pulmonary vascular engorgement without frank edema. No focal infiltrate. Electronically Signed   By: Zetta Bills M.D.   On: 08/27/2019 12:03   IMPRESSION:  *IDA: Hemoglobin 5.2 grams yesterday.  Now 8.4 g after 3 units of packed red blood cells.  Denies any overt bleeding.  Stools were brown heme-positive on exam by the ED yesterday. 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  No GI follow-up since then. *CAD s/p CABG in 11/2018.  Had elevated troponins.  Cardiology has seen the patient and says likely demand ischemia in setting of anemia. *PAF on Eliquis:  Last dose was 11/11 AM.  PLAN: *Will plan for EGD and colonoscopy on 11/13 AM with Dr. Loletha Carrow. *Monitor Hgb and transfuse further if needed. *Consider dose of IV iron as  inpatient.   Jake Gross  08/28/2019, 9:20 AM  GI ATTENDING  History, laboratories, x-rays reviewed.  Patient personally seen and examined.  Agree with comprehensive consultation note as outlined above.  We are asked to see the patient for iron deficiency anemia.  No obvious overt bleeding.  Stools were heme positive but brown.  He has had an appropriate response to transfusion therapy for his symptomatic anemia.  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  He has had no GI follow-up since.  He does have a history of PAF on Eliquis though his last dose of the oral anticoagulant was yesterday.  He does have mild renal insufficiency though should have Eliquis washout by tomorrow.  He is set up for colonoscopy and upper endoscopy with Dr. Loletha Carrow in the morning.the patient is high risk due to his comorbidities and the need for chronic anticoagulation therapy.  The nature of the procedure, as well as the risks, benefits, and alternatives were carefully and thoroughly reviewed with the patient. Ample time for discussion and questions allowed. The patient understood, was satisfied, and agreed to proceed.  Jake Gross., M.D. [facility] Division of Gastroenterology

## 2019-08-28 NOTE — Plan of Care (Signed)
Discussed plan of care for the evening, pain management, voiding and blood transfusions with some teach back displayed

## 2019-08-29 ENCOUNTER — Encounter (HOSPITAL_COMMUNITY)
Admission: EM | Disposition: A | Discharge status: Home / Self Care | Payer: Self-pay | Source: Home / Self Care | Attending: Internal Medicine

## 2019-08-29 ENCOUNTER — Inpatient Hospital Stay (HOSPITAL_COMMUNITY): Payer: Medicare Other | Admitting: Anesthesiology

## 2019-08-29 ENCOUNTER — Encounter (HOSPITAL_COMMUNITY): Payer: Self-pay | Admitting: Anesthesiology

## 2019-08-29 ENCOUNTER — Inpatient Hospital Stay (HOSPITAL_COMMUNITY): Payer: Medicare Other

## 2019-08-29 DIAGNOSIS — K5520 Angiodysplasia of colon without hemorrhage: Secondary | ICD-10-CM

## 2019-08-29 DIAGNOSIS — K635 Polyp of colon: Secondary | ICD-10-CM

## 2019-08-29 DIAGNOSIS — I10 Essential (primary) hypertension: Secondary | ICD-10-CM

## 2019-08-29 DIAGNOSIS — Z951 Presence of aortocoronary bypass graft: Secondary | ICD-10-CM

## 2019-08-29 DIAGNOSIS — I340 Nonrheumatic mitral (valve) insufficiency: Secondary | ICD-10-CM

## 2019-08-29 DIAGNOSIS — K31819 Angiodysplasia of stomach and duodenum without bleeding: Secondary | ICD-10-CM

## 2019-08-29 DIAGNOSIS — N400 Enlarged prostate without lower urinary tract symptoms: Secondary | ICD-10-CM

## 2019-08-29 DIAGNOSIS — Z7901 Long term (current) use of anticoagulants: Secondary | ICD-10-CM

## 2019-08-29 HISTORY — PX: HOT HEMOSTASIS: SHX5433

## 2019-08-29 HISTORY — PX: COLONOSCOPY WITH PROPOFOL: SHX5780

## 2019-08-29 HISTORY — PX: ESOPHAGOGASTRODUODENOSCOPY (EGD) WITH PROPOFOL: SHX5813

## 2019-08-29 HISTORY — PX: POLYPECTOMY: SHX5525

## 2019-08-29 HISTORY — PX: HEMOSTASIS CLIP PLACEMENT: SHX6857

## 2019-08-29 LAB — CBC
HCT: 30.3 % — ABNORMAL LOW (ref 39.0–52.0)
HCT: 29.9 % — ABNORMAL LOW (ref 39.0–52.0)
HCT: 28.7 % — ABNORMAL LOW (ref 39.0–52.0)
Hemoglobin: 9.3 g/dL — ABNORMAL LOW (ref 13.0–17.0)
Hemoglobin: 9.3 g/dL — ABNORMAL LOW (ref 13.0–17.0)
Hemoglobin: 8.9 g/dL — ABNORMAL LOW (ref 13.0–17.0)
MCH: 23.2 pg — ABNORMAL LOW (ref 26.0–34.0)
MCH: 23.4 pg — ABNORMAL LOW (ref 26.0–34.0)
MCH: 23.1 pg — ABNORMAL LOW (ref 26.0–34.0)
MCHC: 30.7 g/dL (ref 30.0–36.0)
MCHC: 31.1 g/dL (ref 30.0–36.0)
MCHC: 31 g/dL (ref 30.0–36.0)
MCV: 75.6 fL — ABNORMAL LOW (ref 80.0–100.0)
MCV: 75.3 fL — ABNORMAL LOW (ref 80.0–100.0)
MCV: 74.4 fL — ABNORMAL LOW (ref 80.0–100.0)
Platelets: 282 10*3/uL (ref 150–400)
Platelets: 245 10*3/uL (ref 150–400)
Platelets: 244 10*3/uL (ref 150–400)
RBC: 4.01 MIL/uL — ABNORMAL LOW (ref 4.22–5.81)
RBC: 3.97 MIL/uL — ABNORMAL LOW (ref 4.22–5.81)
RBC: 3.86 MIL/uL — ABNORMAL LOW (ref 4.22–5.81)
RDW: 20.2 % — ABNORMAL HIGH (ref 11.5–15.5)
RDW: 19.9 % — ABNORMAL HIGH (ref 11.5–15.5)
RDW: 20.4 % — ABNORMAL HIGH (ref 11.5–15.5)
WBC: 8.9 10*3/uL (ref 4.0–10.5)
WBC: 6.8 10*3/uL (ref 4.0–10.5)
WBC: 7.9 10*3/uL (ref 4.0–10.5)
nRBC: 0.3 % — ABNORMAL HIGH (ref 0.0–0.2)
nRBC: 0.3 % — ABNORMAL HIGH (ref 0.0–0.2)
nRBC: 0 % (ref 0.0–0.2)

## 2019-08-29 LAB — BPAM RBC
Blood Product Expiration Date: 202012162359
Blood Product Expiration Date: 202012162359
Blood Product Expiration Date: 202012162359
ISSUE DATE / TIME: 202011111529
ISSUE DATE / TIME: 202011112157
ISSUE DATE / TIME: 202011120140
Unit Type and Rh: 5100
Unit Type and Rh: 5100
Unit Type and Rh: 5100

## 2019-08-29 LAB — TYPE AND SCREEN
ABO/RH(D): O POS
Antibody Screen: NEGATIVE
Unit division: 0
Unit division: 0
Unit division: 0

## 2019-08-29 LAB — BASIC METABOLIC PANEL
Anion gap: 13 (ref 5–15)
BUN: 28 mg/dL — ABNORMAL HIGH (ref 8–23)
CO2: 22 mmol/L (ref 22–32)
Calcium: 9 mg/dL (ref 8.9–10.3)
Chloride: 103 mmol/L (ref 98–111)
Creatinine, Ser: 1.86 mg/dL — ABNORMAL HIGH (ref 0.61–1.24)
GFR calc Af Amer: 40 mL/min — ABNORMAL LOW (ref >60)
GFR calc non Af Amer: 34 mL/min — ABNORMAL LOW (ref >60)
Glucose, Bld: 96 mg/dL (ref 70–99)
Potassium: 3.6 mmol/L (ref 3.5–5.1)
Sodium: 138 mmol/L (ref 135–145)

## 2019-08-29 LAB — GLUCOSE, CAPILLARY
Glucose-Capillary: 105 mg/dL — ABNORMAL HIGH (ref 70–99)
Glucose-Capillary: 112 mg/dL — ABNORMAL HIGH (ref 70–99)
Glucose-Capillary: 100 mg/dL — ABNORMAL HIGH (ref 70–99)
Glucose-Capillary: 116 mg/dL — ABNORMAL HIGH (ref 70–99)
Glucose-Capillary: 165 mg/dL — ABNORMAL HIGH (ref 70–99)

## 2019-08-29 LAB — ECHOCARDIOGRAM COMPLETE
Height: 67 in
Weight: 3248 oz

## 2019-08-29 SURGERY — ESOPHAGOGASTRODUODENOSCOPY (EGD) WITH PROPOFOL
Anesthesia: Monitor Anesthesia Care

## 2019-08-29 MED ORDER — SODIUM CHLORIDE 0.9 % IV SOLN
INTRAVENOUS | Status: DC
  Administered 2019-08-29: 05:00:00 via INTRAVENOUS

## 2019-08-29 MED ORDER — AMLODIPINE BESYLATE 5 MG PO TABS
5.0000 mg | ORAL_TABLET | Freq: Every day | ORAL | Status: DC
  Administered 2019-08-30: 5 mg via ORAL
  Filled 2019-08-29 (×2): qty 1

## 2019-08-29 MED ORDER — PANTOPRAZOLE SODIUM 40 MG PO TBEC
40.0000 mg | DELAYED_RELEASE_TABLET | Freq: Every day | ORAL | Status: DC
  Administered 2019-08-30: 40 mg via ORAL
  Filled 2019-08-29: qty 1

## 2019-08-29 MED ORDER — EPHEDRINE SULFATE 50 MG/ML IJ SOLN
INTRAMUSCULAR | Status: DC | PRN
  Administered 2019-08-29 (×5): 10 mg via INTRAVENOUS

## 2019-08-29 MED ORDER — ALBUMIN HUMAN 5 % IV SOLN
INTRAVENOUS | Status: DC | PRN
  Administered 2019-08-29: 09:00:00 via INTRAVENOUS

## 2019-08-29 MED ORDER — PHENYLEPHRINE 40 MCG/ML (10ML) SYRINGE FOR IV PUSH (FOR BLOOD PRESSURE SUPPORT)
PREFILLED_SYRINGE | INTRAVENOUS | Status: DC | PRN
  Administered 2019-08-29: 40 ug via INTRAVENOUS
  Administered 2019-08-29: 80 ug via INTRAVENOUS
  Administered 2019-08-29: 40 ug via INTRAVENOUS
  Administered 2019-08-29: 80 ug via INTRAVENOUS
  Administered 2019-08-29: 40 ug via INTRAVENOUS

## 2019-08-29 MED ORDER — FUROSEMIDE 10 MG/ML IJ SOLN
40.0000 mg | Freq: Two times a day (BID) | INTRAMUSCULAR | Status: DC
  Administered 2019-08-29 – 2019-08-30 (×2): 40 mg via INTRAVENOUS
  Filled 2019-08-29 (×2): qty 4

## 2019-08-29 MED ORDER — PROPOFOL 500 MG/50ML IV EMUL
INTRAVENOUS | Status: DC | PRN
  Administered 2019-08-29: 125 ug/kg/min via INTRAVENOUS

## 2019-08-29 MED ORDER — HYDRALAZINE HCL 20 MG/ML IJ SOLN: 10.0000 mg | Freq: Three times a day (TID) | INTRAMUSCULAR | Status: DC | PRN

## 2019-08-29 SURGICAL SUPPLY — 25 items

## 2019-08-29 NOTE — Op Note (Signed)
[facility] Patient Name: Jake Gross Procedure Date : 08/29/2019 MRN: [id number] Attending MD: Estill Cotta. Loletha Carrow , MD Date of Birth: 1941-07-13 CSN: [id number] Age: 78 Admit Type: Inpatient Procedure:                Colonoscopy Indications:              Iron deficiency anemia secondary to chronic blood                            loss Providers:                Mallie Mussel L. Loletha Carrow, MD, Josie Dixon, RN, Ladona Ridgel, Technician Referring MD:             [facility] Medicines:                Monitored Anesthesia Care Complications:            No immediate complications. Estimated Blood Loss:     Estimated blood loss was minimal. Procedure:                Pre-Anesthesia Assessment:                           - Prior to the procedure, a History and Physical                            was performed, and patient medications and                            allergies were reviewed. The patient's tolerance of                            previous anesthesia was also reviewed. The risks                            and benefits of the procedure and the sedation                            options and risks were discussed with the patient.                            All questions were answered, and informed consent                            was obtained. Prior Anticoagulants: The patient has                            taken Eliquis (apixaban), last dose was 2 days                            prior to procedure. ASA Grade Assessment: III - A  patient with severe systemic disease. After                            reviewing the risks and benefits, the patient was                            deemed in satisfactory condition to undergo the                            procedure.                           After obtaining informed consent, the colonoscope                            was passed under direct vision. Throughout the                     procedure, the patient's blood pressure, pulse, and                            oxygen saturations were monitored continuously. The                            CF-HQ190L ([id number]) Olympus colonoscope was                            introduced through the anus and advanced to the the                            terminal ileum, with identification of the                            appendiceal orifice and IC valve. The colonoscopy                            was performed without difficulty. The patient                            tolerated the procedure well. The quality of the                            bowel preparation was good. The terminal ileum,                            ileocecal valve, appendiceal orifice, and rectum                            were photographed. The bowel preparation used was                            MoviPrep via split dose instruction. Scope In: 9:36:20 AM Scope Out: 9:52:12 AM Scope Withdrawal Time: 0 hours 14 minutes 25 seconds  Total Procedure Duration: 0 hours 15 minutes 52 seconds  Findings:      The  digital rectal exam findings include enlarged, soft prostate.      Three sessile polyps were found in the transverse colon and ascending       colon. The polyps were 3 to 5 mm in size. These polyps were removed with       a cold snare. Resection and retrieval were complete. For hemostasis, one       hemostatic clip was successfully placed (MR conditional) on the most       proximal ascending colon polyp.      There was evidence of a prior TEM resection in the mid rectum       (adenocarcinomas in TVA polyp 2016). This was characterized by healthy       appearing mucosa. No polyp recurrence seen.      Retroflexion in the rectum was not performed due to anatomy.      The exam was otherwise without abnormality. Impression:               - Enlarged prostate found on digital rectal exam.                           - Three 3 to 5 mm polyps in the  transverse colon                            and in the ascending colon, removed with a cold                            snare. Resected and retrieved. Clip (MR                            conditional) was placed.                           - TEM anastomosis, characterized by healthy                            appearing mucosa.                           - The examination was otherwise normal. Recommendation:           - Return patient to hospital ward for ongoing care.                           - Resume Eliquis (apixaban) at prior dose in 7 days.                           - Resume regular diet.                           - See the other procedure note for documentation of                            additional recommendations. (EGD) Procedure Code(s):        --- Professional ---  45385, Colonoscopy, flexible; with removal of                            tumor(s), polyp(s), or other lesion(s) by snare                            technique Diagnosis Code(s):        --- Professional ---                           K63.5, Polyp of colon                           Z98.0, Intestinal bypass and anastomosis status                           D50.0, Iron deficiency anemia secondary to blood                            loss (chronic)                           N40.0, Benign prostatic hyperplasia without lower                            urinary tract symptoms CPT copyright 2019 [employer]. All rights reserved. The codes documented in this report are preliminary and upon coder review may  be revised to meet current compliance requirements. Henry L. Loletha Carrow, MD 08/29/2019 10:01:56 AM This report has been signed electronically. Number of Addenda: 0

## 2019-08-29 NOTE — Op Note (Signed)
[facility] Patient Name: Jake Gross Procedure Date : 08/29/2019 MRN: [id number] Attending MD: Estill Cotta. Loletha Carrow , MD Date of Birth: Feb 25, 1941 CSN: [id number] Age: 78 Admit Type: Inpatient Procedure:                Upper GI endoscopy Indications:              Iron deficiency anemia secondary to chronic blood                            loss Providers:                Mallie Mussel L. Loletha Carrow, MD, Josie Dixon, RN, Ladona Ridgel, Technician Referring MD:             [facility] Medicines:                Monitored Anesthesia Care Complications:            No immediate complications. Estimated Blood Loss:     Estimated blood loss was minimal. Procedure:                Pre-Anesthesia Assessment:                           - Prior to the procedure, a History and Physical                            was performed, and patient medications and                            allergies were reviewed. The patient's tolerance of                            previous anesthesia was also reviewed. The risks                            and benefits of the procedure and the sedation                            options and risks were discussed with the patient.                            All questions were answered, and informed consent                            was obtained. Prior Anticoagulants: The patient has                            taken Eliquis (apixaban), last dose was 2 days                            prior to procedure. ASA Grade Assessment: III - A  patient with severe systemic disease. After                            reviewing the risks and benefits, the patient was                            deemed in satisfactory condition to undergo the                            procedure.                           After obtaining informed consent, the endoscope was                            passed under direct vision. Throughout the                          procedure, the patient's blood pressure, pulse, and                            oxygen saturations were monitored continuously. The                            GIF-H190 ([id number] Olympus gastroscope was                            introduced through the mouth, and advanced to the                            third part of duodenum. The upper GI endoscopy was                            accomplished without difficulty. The patient                            tolerated the procedure well. Scope In: Scope Out: Findings:      The esophagus was normal.      The stomach was normal.      The cardia and gastric fundus were normal on retroflexion.      Two diminutive angioectasias without bleeding were found in the duodenal       bulb. Coagulation for bleeding prevention using argon plasma (right       colon setting) was successful.      The exam of the duodenum was otherwise normal. Impression:               - Normal esophagus.                           - Normal stomach.                           - Two non-bleeding angioectasias in the duodenum.                            Treated with argon  plasma coagulation (APC).                            Probable source of chronic blood loss, often with                            more distal small bowel AVMs as well.                           - No specimens collected. Recommendation:           - Return patient to hospital ward for ongoing care.                           - Resume regular diet.                           - Resume Eliquis (apixaban) at prior dose in 7 days.                           - Needs close attention to iron dosing and                            following hemoglobin with primary care in                            outpatient setting.                           - See the other procedure note for documentation of                            additional recommendations. (colonoscopy) Procedure Code(s):        --- Professional  ---                           [phone], Esophagogastroduodenoscopy, flexible,                            transoral; with control of bleeding, any method Diagnosis Code(s):        --- Professional ---                           K31.819, Angiodysplasia of stomach and duodenum                            without bleeding                           D50.0, Iron deficiency anemia secondary to blood                            loss (chronic) CPT copyright 2019 American Medical Association. All rights reserved. The codes documented in this report are preliminary and upon coder review may  be revised to meet current compliance requirements. Henry L. Loletha Carrow, MD 08/29/2019 10:08:07 AM This report has  been signed electronically. Number of Addenda: 0

## 2019-08-29 NOTE — Progress Notes (Signed)
EGD and colonoscopy reports with recommendations in chart.  Result discussed with patient after procedure (after recovery from sedation), and spoke with his son Raquel Sarna by phone.

## 2019-08-29 NOTE — Anesthesia Postprocedure Evaluation (Signed)
Anesthesia Post Note  Patient: Jake Gross  Procedure(s) Performed: ESOPHAGOGASTRODUODENOSCOPY (EGD) WITH PROPOFOL (N/A ) COLONOSCOPY WITH PROPOFOL (N/A ) HOT HEMOSTASIS (ARGON PLASMA COAGULATION/BICAP) (N/A ) POLYPECTOMY HEMOSTASIS CLIP PLACEMENT     Patient location during evaluation: Endoscopy Anesthesia Type: MAC Level of consciousness: awake and alert Pain management: pain level controlled Vital Signs Assessment: post-procedure vital signs reviewed and stable Respiratory status: spontaneous breathing, nonlabored ventilation, respiratory function stable and patient connected to nasal cannula oxygen Cardiovascular status: blood pressure returned to baseline and stable Postop Assessment: no apparent nausea or vomiting Anesthetic complications: no    Last Vitals:  Vitals:   08/29/19 1010 08/29/19 1020  BP: (!) 116/34 (!) 141/44  Pulse:    Resp: 16 18  Temp:    SpO2: 100% 100%    Last Pain:  Vitals:   08/29/19 1020  TempSrc:   PainSc: 0-No pain                 Barnet Glasgow

## 2019-08-29 NOTE — Progress Notes (Addendum)
PROGRESS NOTE  Jake Gross JYN:[id number] DOB: [birth year] DOA: 08/27/2019 PCP: Jearld Fenton, NP  HPI/Recap of past 24 hours: Jake Gross is a 78 y.o. male with medical history significant for CAD status post CABG in 11/2018, hypertension, hyperlipidemia, CKD, CVA, A. fib, colon CA in 2016, diabetes mellitus, presents to the ED complaining of significant shortness of breath, generalized weakness for the past couple of weeks.  Patient was at his outpatient cardiology clinic for follow-up appointment as patient has had difficulty with managing his diastolic heart failure as well as uncontrolled hypertension, was noted to be significantly short of breath. Due to persistent dyspnea, patient was advised to go to the ER via EMS.  Patient denied any associated chest pain, fever/chills, cough, abdominal pain, nausea/vomiting, hematemesis, melena.  Denies any sick contacts. In the ED, labs showed a hemoglobin of 5.2, baseline around 10, creatinine of 1.91, BNP of 502.5, troponin 1181-->1320, anemia panel showed iron of 15, TIBC 500, sats 3, ferritin 6 vitamin B12 109.  FOBT positive.  CT abdomen pelvis showed cirrhosis, cholelithiasis, chest x-ray showed cardiomegaly with pulmonary vascular engorgement, no focal infiltrates.  Cardiology and GI consulted.    Today, saw patient after EGD/colonoscopy, denies any new complaints, denies any worsening shortness of breath, chest pain, abdominal pain, nausea/vomiting, fever/chills.  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   HLD (hyperlipidemia)   S/P CABG x 4   Acute on chronic diastolic CHF (congestive heart failure) (HCC)   Paroxysmal atrial fibrillation (HCC)   CKD (chronic kidney disease)   History of CVA (cerebrovascular accident)   Iron deficiency anemia  due to chronic blood loss   Heme positive stool   Chronic anticoagulation   AVM (arteriovenous malformation) of small bowel, acquired   Possible upper GI bleed likely 2/2 duodenal angioectasis Hemoglobin 5.2 on presentation, baseline around 10 FOBT positive, patient on Eliquis History of colon CA in 2016 CT abdomen showed cirrhosis, cholelithiasis GI consulted, EGD done on 08/29/2019 showed 2 nonbleeding angiectasis in the duodenum treated with argon plasma coagulation for bleeding prevention.  May resume Eliquis in 7 days.  Colonoscopy showed 3  3-5 mm polyps in the transverse/ascending colon, removed with cold snare Protonix daily Advance diet Monitor closely   Acute on chronic blood loss anemia/iron deficiency Hemoglobin 5.2 on presentation, baseline around 10 Anemia panel with iron 15, sats 3, TIBC 500, ferritin 6, vitamin B12 109 Transfused 3 units of PRBC on 08/27/2019, with improvement in hemoglobin Vitamin B12 supplementation S/P 1 dose of IV Feraheme on 08/28/2019 Serial CBC post transfusion, monitor closely  CKD stage IIIb Baseline creatinine around 1.8-2.0 Currently around baseline Daily BMP   Acute on chronic diastolic HF Appears slightly overloaded, with trace edema on presentation Echo done on 2/20 showed EF of 60 to 65%, impaired diastolic relaxation Continue IV Lasix daily, hold home p.o. Lasix, may restart on 08/30/2019 Cardiology consulted, appreciate recs Monitor BMP closely   Elevated troponin/history of CAD status post CABGX4V on 2/20 Currently chest pain-free Likely demand ischemia 2/2 symptomatic anemia EKG with no acute ST changes Hold aspirin Cardiology consulted Telemetry   Paroxysmal A. Fib Currently rate controlled EKG with sinus rhythm Continue metoprolol Hold Eliquis, plan to restart on 09/05/2019 as per GI   Diabetes mellitus type 2 A1c 5.8 on 9/20 SSI, Lantus, hypoglycemic protocol, Accu-Cheks  Hypertension Hold all BP meds due  to significant anemia for now Continue to hold hydralazine, Imdur Continue Lopressor, amlodipine, IV hydralazine prn for now. Plan to restart scheduled meds once BP steadily runs high   History of cryptogenic stroke in 2018 Continue to hold Eliquis   Hyperlipidemia Continue Crestor   History of Colon ca in 2016 Status post surgery Outpatient follow-up  Obesity Lifestyle modification advised          Malnutrition Type:      Malnutrition Characteristics:      Nutrition Interventions:       Estimated body mass index is 31.79 kg/m as calculated from the following:   Height as of this encounter: 5\' 7"  (1.702 m).   Weight as of this encounter: 92.1 kg.     Code Status: Full  Family Communication: None at bedside  Disposition Plan: To be determined   Consultants: Cardiology GI  Procedures: None  Antimicrobials: None  DVT prophylaxis: SCD   Objective: Vitals:   08/29/19 1000 08/29/19 1010 08/29/19 1020 08/29/19 1253  BP: (!) 127/42 (!) 116/34 (!) 141/44 (!) 143/61  Pulse:    (!) 53  Resp: 14 16 18 16   Temp: (!) 97.4 F (36.3 C)   97.6 F (36.4 C)  TempSrc: Temporal   Oral  SpO2: 100% 100% 100% 95%  Weight:      Height:        Intake/Output Summary (Last 24 hours) at 08/29/2019 1756 Last data filed at 08/29/2019 1400 Gross per 24 hour  Intake 1790 ml  Output 900 ml  Net 890 ml   Filed Weights   08/28/19 0440 08/29/19 0015 08/29/19 0848  Weight: 91.9 kg 89.5 kg 92.1 kg    Exam: General: NAD  Cardiovascular: S1, S2 present Respiratory: Mild bibasilar crackles noted Abdomen: Soft, nontender, nondistended, bowel sounds present Musculoskeletal: No bilateral pedal edema noted Skin: Normal Psychiatry: Normal mood    Data Reviewed: CBC: Recent Labs  Lab 08/28/19 0640 08/28/19 1540 08/28/19 2323 08/29/19 0535 08/29/19 1517  WBC 9.4 10.8* 10.8* 8.9 6.8  HGB 8.4* 8.1* 8.2* 9.3* 9.3*  HCT 27.4* 26.2* 26.9* 30.3* 29.9*  MCV  75.7* 75.7* 76.0* 75.6* 75.3*  PLT 246 249 259 282 350   Basic Metabolic Panel: Recent Labs  Lab 08/27/19 1142 08/28/19 0640 08/29/19 0523  NA 140 142 138  K 4.4 4.0 3.6  CL 110 107 103  CO2 19* 24 22  GLUCOSE 210* 68* 96  BUN 37* 34* 28*  CREATININE 1.91* 2.12* 1.86*  CALCIUM 9.1 8.9 9.0   GFR: Estimated Creatinine Clearance: 36.6 mL/min (A) (by C-G formula based on SCr of 1.86 mg/dL (H)). Liver Function Tests: No results for input(s): AST, ALT, ALKPHOS, BILITOT, PROT, ALBUMIN in the last 168 hours. No results for input(s): LIPASE, AMYLASE in the last 168 hours. No results for input(s): AMMONIA in the last 168 hours. Coagulation Profile: Recent Labs  Lab 08/28/19 0640  INR 1.4*   Cardiac Enzymes: No results for input(s): CKTOTAL, CKMB, CKMBINDEX, TROPONINI in the last 168 hours. BNP (last 3 results) Recent Labs    05/19/19 1300 06/02/19 1515 06/09/19 1144  PROBNP 744* 677* 775*   HbA1C: No results for input(s): HGBA1C in the last 72 hours. CBG: Recent Labs  Lab 08/28/19 2156 08/29/19 0522 08/29/19 0852 08/29/19 1047 08/29/19 1601  GLUCAP 84 105* 112* 100* 116*   Lipid Profile: No results for input(s): CHOL, HDL, LDLCALC, TRIG, CHOLHDL, LDLDIRECT in the last 72 hours. Thyroid  Function Tests: Recent Labs    08/27/19 1849  TSH 1.445   Anemia Panel: Recent Labs    08/27/19 1519  VITAMINB12 109*  FOLATE 16.3  FERRITIN 6*  TIBC 500*  IRON 15*  RETICCTPCT 4.5*   Urine analysis:    Component Value Date/Time   COLORURINE YELLOW 11/26/2018 0304   APPEARANCEUR HAZY (A) 11/26/2018 0304   LABSPEC 1.017 11/26/2018 0304   PHURINE 5.0 11/26/2018 0304   GLUCOSEU 150 (A) 11/26/2018 0304   HGBUR NEGATIVE 11/26/2018 0304   BILIRUBINUR NEGATIVE 11/26/2018 0304   KETONESUR NEGATIVE 11/26/2018 0304   PROTEINUR >=300 (A) 11/26/2018 0304   UROBILINOGEN 0.2 04/11/2015 1658   NITRITE NEGATIVE 11/26/2018 0304   LEUKOCYTESUR NEGATIVE 11/26/2018 0304   Sepsis  Labs: @LABRCNTIP (procalcitonin:4,lacticidven:4)  ) Recent Results (from the past 240 hour(s))  SARS CORONAVIRUS 2 (TAT 6-24 HRS) Nasopharyngeal Nasopharyngeal Swab     Status: None   Collection Time: 08/27/19  3:25 PM   Specimen: Nasopharyngeal Swab  Result Value Ref Range Status   SARS Coronavirus 2 NEGATIVE NEGATIVE Final    Comment: (NOTE) SARS-CoV-2 target nucleic acids are NOT DETECTED. The SARS-CoV-2 RNA is generally detectable in upper and lower respiratory specimens during the acute phase of infection. Negative results do not preclude SARS-CoV-2 infection, do not rule out co-infections with other pathogens, and should not be used as the sole basis for treatment or other patient management decisions. Negative results must be combined with clinical observations, patient history, and epidemiological information. The expected result is Negative. Fact Sheet for Patients: SugarRoll.be Fact Sheet for Healthcare Providers: https://www.woods-mathews.com/ This test is not yet approved or cleared by the [place] FDA and  has been authorized for detection and/or diagnosis of SARS-CoV-2 by FDA under an Emergency Use Authorization (EUA). This EUA will remain  in effect (meaning this test can be used) for the duration of the COVID-19 declaration under Section 56 4(b)(1) of the Act, 21 U.S.C. section 360bbb-3(b)(1), unless the authorization is terminated or revoked sooner. Performed at [facility], [place] [address] [place], [place] [address]       Studies: No results found.  Scheduled Meds:  amLODipine  5 mg Oral Daily   furosemide  40 mg Intravenous BID   insulin aspart  0-5 Units Subcutaneous QHS   insulin aspart  0-9 Units Subcutaneous TID WC   insulin glargine  20 Units Subcutaneous Daily   metoprolol tartrate  37.5 mg Oral BID   [START ON 08/30/2019] pantoprazole  40 mg Oral QAC breakfast   rosuvastatin  20 mg Oral q1800    sodium chloride flush  3 mL Intravenous Q12H   vitamin B-12  1,000 mcg Oral Daily    Continuous Infusions:    LOS: 2 days     Alma Friendly, MD [facility]  If 7PM-7AM, please contact night-coverage [email or web address] 08/29/2019, 5:56 PM

## 2019-08-29 NOTE — Transfer of Care (Signed)
Immediate Anesthesia Transfer of Care Note  Patient: [place]  Procedure(s) Performed: ESOPHAGOGASTRODUODENOSCOPY (EGD) WITH PROPOFOL (N/A ) COLONOSCOPY WITH PROPOFOL (N/A ) HOT HEMOSTASIS (ARGON PLASMA COAGULATION/BICAP) (N/A ) POLYPECTOMY HEMOSTASIS CLIP PLACEMENT  Patient Location: Endoscopy Unit  Anesthesia Type:MAC  Level of Consciousness: awake, alert  and oriented  Airway & Oxygen Therapy: Patient Spontanous Breathing and Patient connected to nasal cannula oxygen  Post-op Assessment: Report given to RN and Post -op Vital signs reviewed and stable  Post vital signs: Reviewed and stable, BP 127/42, SpO2 98% HR 54  Last Vitals:  Vitals Value Taken Time  BP    Temp    Pulse    Resp    SpO2      Last Pain:  Vitals:   08/29/19 0848  TempSrc:   PainSc: 0-No pain         Complications: No apparent anesthesia complications

## 2019-08-29 NOTE — Progress Notes (Signed)
  Echocardiogram 2D Echocardiogram has been performed.  Jenicka Coxe A Linsay Vogt 08/29/2019, 2:08 PM

## 2019-08-29 NOTE — Progress Notes (Signed)
   Called office to schedule follow-up appointment with Estella Husk, PA-C, or Dr. Angelena Form.  Apparently, there are no in-office appointments available until 09/30/2019. Will go ahead and schedule an appointment on this day with [facility]. However, per Dr. Harrell Gave will also get patient in before this for virtual visit on 09/16/2019 with [facility] as well.  Darreld Mclean, PA-C 08/29/2019 3:20 PM

## 2019-08-29 NOTE — Progress Notes (Signed)
Progress Note  Patient Name: Jake Gross Date of Encounter: 08/29/2019  Primary Cardiologist: Lauree Chandler, MD   Subjective   Seen post EGD/colonoscopy. Results reviewed. He is feeling sleepy but denies pain.  Inpatient Medications    Scheduled Meds:  amLODipine  5 mg Oral Daily   furosemide  40 mg Intravenous BID   insulin aspart  0-5 Units Subcutaneous QHS   insulin aspart  0-9 Units Subcutaneous TID WC   insulin glargine  20 Units Subcutaneous Daily   metoprolol tartrate  37.5 mg Oral BID   [START ON 08/30/2019] pantoprazole  40 mg Oral QAC breakfast   rosuvastatin  20 mg Oral q1800   sodium chloride flush  3 mL Intravenous Q12H   vitamin B-12  1,000 mcg Oral Daily   Continuous Infusions:  PRN Meds: acetaminophen **OR** acetaminophen, albuterol, HYDROcodone-acetaminophen, ondansetron **OR** ondansetron (ZOFRAN) IV, polyethylene glycol   Vital Signs    Vitals:   08/29/19 1000 08/29/19 1010 08/29/19 1020 08/29/19 1253  BP: (!) 127/42 (!) 116/34 (!) 141/44 (!) 143/61  Pulse:    (!) 53  Resp: 14 16 18 16   Temp: (!) 97.4 F (36.3 C)   97.6 F (36.4 C)  TempSrc: Temporal   Oral  SpO2: 100% 100% 100% 95%  Weight:      Height:        Intake/Output Summary (Last 24 hours) at 08/29/2019 1322 Last data filed at 08/29/2019 0951 Gross per 24 hour  Intake 1874.86 ml  Output 850 ml  Net 1024.86 ml   Last 3 Weights 08/29/2019 08/29/2019 08/28/2019  Weight (lbs) 203 lb 197 lb 5 oz 202 lb 9.6 oz  Weight (kg) 92.08 kg 89.5 kg 91.9 kg      Telemetry    SR/SB - Personally Reviewed  ECG    No new - Personally Reviewed  Physical Exam   GEN: No acute distress.   Neck: No JVD. L carotid bruit Cardiac: RRR, no rubs, or gallops. 3/6 SM Respiratory: Clear to auscultation bilaterally. GI: Soft, nontender, non-distended  MS: No edema; No deformity. Neuro:  Nonfocal  Psych: Normal affect   Labs    High Sensitivity Troponin:   Recent Labs   Lab 08/27/19 1142 08/27/19 1333  TROPONINIHS 1,181* 1,320*      Chemistry Recent Labs  Lab 08/27/19 1142 08/28/19 0640 08/29/19 0523  NA 140 142 138  K 4.4 4.0 3.6  CL 110 107 103  CO2 19* 24 22  GLUCOSE 210* 68* 96  BUN 37* 34* 28*  CREATININE 1.91* 2.12* 1.86*  CALCIUM 9.1 8.9 9.0  GFRNONAA 33* 29* 34*  GFRAA 39* 34* 40*  ANIONGAP 11 11 13      Hematology Recent Labs  Lab 08/28/19 1540 08/28/19 2323 08/29/19 0535  WBC 10.8* 10.8* 8.9  RBC 3.46* 3.54* 4.01*  HGB 8.1* 8.2* 9.3*  HCT 26.2* 26.9* 30.3*  MCV 75.7* 76.0* 75.6*  MCH 23.4* 23.2* 23.2*  MCHC 30.9 30.5 30.7  RDW 19.9* 20.3* 20.2*  PLT 249 259 282    BNP Recent Labs  Lab 08/27/19 1142  BNP 502.5*     DDimer No results for input(s): DDIMER in the last 168 hours.   Radiology    Ct Abdomen Pelvis Wo Contrast  Result Date: 08/27/2019 CLINICAL DATA:  GI bleed. EXAM: CT ABDOMEN AND PELVIS WITHOUT CONTRAST TECHNIQUE: Multidetector CT imaging of the abdomen and pelvis was performed following the standard protocol without IV contrast. COMPARISON:  CT scan dated 02/15/2015 FINDINGS:  Lower chest: Extensive coronary artery calcification. Aortic atherosclerosis. Small right pleural effusion. Hepatobiliary: Nodular liver parenchyma suggesting cirrhosis. The liver is smaller than on the prior study. Large calcified gallstone. Gallbladder wall is not thickened. No bile duct dilatation. Pancreas: Unremarkable. No pancreatic ductal dilatation or surrounding inflammatory changes. Spleen: Normal in size without focal abnormality. Adrenals/Urinary Tract: Adrenal glands and kidneys are normal. No hydronephrosis. Bladder is normal. Stomach/Bowel: Stomach is within normal limits. Appendix appears normal. No evidence of bowel wall thickening, distention, or inflammatory changes. Vascular/Lymphatic: Aortic atherosclerosis. No enlarged abdominal or pelvic lymph nodes. Reproductive: The prostate gland is enlarged, measuring 6.2  cm in diameter. Other: No abdominal wall hernia or abnormality. No abdominopelvic ascites. Musculoskeletal: No acute or significant osseous findings. IMPRESSION: 1. Small right pleural effusion. 2. Cirrhosis. 3. Cholelithiasis. 4. Enlarged prostate gland. 5. Aortic atherosclerosis. 6. No acute abnormalities. Aortic Atherosclerosis (ICD10-I70.0). Electronically Signed   By: Lorriane Shire M.D.   On: 08/27/2019 18:31    Cardiac Studies   Left Heart Catheterization 11/20/2018:  Prox RCA to Mid RCA lesion is 30% stenosed.  RPDA lesion is 99% stenosed.  Post Atrio lesion is 100% stenosed.  Ost 2nd Diag to 2nd Diag lesion is 99% stenosed.  Prox LAD lesion is 95% stenosed.  Ost 1st Diag lesion is 80% stenosed.  Ost Cx to Prox Cx lesion is 70% stenosed.  Ost LM lesion is 70% stenosed.  1. Moderately severe ostial left main stenosis.  2. Severe stenosis in the heavily calcified mid LAD 3. Moderately severe proximal Circumflex stenosis 4. The RCA has mild calcific disease through the proximal and mid segments. The PDA is a moderate caliber vessel with severe stenosis. 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  5. 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 I will ask CT surgery to see him to discuss CABG. Given chronic kidney disease, I will admit him for hydration. Will hold Plavix. He will need Plavix washout prior to surgery. Will arrange echo tomorrow to assess LVEF. Will start ASA. Continue beta blocker and statin. Given elevated filling pressures, one dose of IV Lasix tonight.   Echocardiogram 11/21/2018: Impressions: 1. The left ventricle has normal systolic function of 59-16%. The cavity size was mildly increased. There is no increased left ventricular wall thickness. Echo evidence of impaired diastolic relaxation. 2. The right ventricle has normal systolic function. The cavity was normal. There is no  increase in right ventricular wall thickness. 3. Right atrial size was mildly dilated. 4. The mitral valve is normal in structure. There is mild to moderate mitral annular calcification present. No evidence of mitral valve stenosis. No mitral regurgitation. 5. The tricuspid valve is normal in structure. 6. The aortic valve is tricuspid There is mild calcification of the aortic valve. No aortic stenosis. 7. There is mild dilatation of the aortic root and ascending aorta. 8. No evidence of left ventricular regional wall motion abnormalities. 9. No complete TR doppler jet so unable to estimate PA systolic pressure.  Patient Profile     Curren Mohrmann Letterman is a 78 y.o. 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 Horris Latino .  Assessment & Plan  Overall: we will follow up on diuresis and echo. We will get follow up with Estella Husk. Plan to restart apixaban 09/05/19. Will continue to restart home antihypertensives as BP allows.  Acute on Chronic Diastolic CHF -Patient presents with worsening dyspnea on activity over the last 2 weeks.  No edema.  Weight up 5 pounds from 05/2019. Suspect this is complicated picture of anemia plus acute on chronic diastolic heart failure. -BNP elevated in the 500s, Chest x-ray showed cardiomegaly with vascular congestion but no overt edema. -Repeat Echo ordered, will change this to inpatient. Murmur noted on exam so Echo will help evaluate this as well. -Currently on IV Lasix 40mg  daily. Will change to BID today given weight is up, but I suspect he will not need much additional diuresis if his output is good. May be able to change to oral dosing tomorrow.  -Still net  positive due to transfusions/IV meds but appears euvolemic on exam. May be inaccurate due to 4 urine and 6 stools (with uncharted volumes) yesterday. Weight today is 92.1 kg from 90.7 kg -Cr improved today to 1.86 from 2.12 -Continue to monitor daily weights, strict I/O's, and renal function.  Elevated Troponin - High-sentivity troponin elevated at 1,181 >> 1,320. - EKG shows no acute ST/T changes compared to prior tracings. - No angina.  - Suspect demand ischemia in setting of acute anemia with hemoglobin of 5 and possible acute on chronic CHF. No ischemic work-up planned at this time.  - follow up echo  CAD s/p CABG - No angina. - Continue medical therapy.  Paroxysmal Atrial Fibrillation - Currently in normal sinus rhythm.  - Continue home Lopressor 37.5mg  twice daily. - CHA2DS2-VASc = 8 (CAD, CHF, CVA x2,  HTN, DM, age x2). Per GI, okay to restart home Eliquis in 7 days.   Carotid Artery Disease - Pre-CABG dopplers in 11/2018 showed 1-39% stenosis of bilateral ICAs. - Left bruit noted on exam. - Can follow-up as outpatient.   Acute Anemia / Presumed Upper GI Bleed - Hemoglobin 5.2 on admission. Hemoccult positive. S/p 3 units of PRBCs.  -iron 15, %sat 3%, ferritin 6, consistent with iron deficiency anemia. High retic count suggests blood loss. - Hemoglobin 9.3 today. - GI managing, EGD/colonoscopy today.  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 Treated with argon plasma coagulation (APC). Probable source of chronic blood loss, often with more distal small bowel AVMs as well. - Colonoscopy showed three sessile polyps in the transverse colon and ascending colon. These polyps were removed with a cold snare. Resection and retrieval were complete. For hemostasis, one hemostatic clip was successfully placed (MR conditional) on the most proximal ascending colon polyp.  - Per GI, okay to restart home Eliquis in 7 days.  Hypertension - BP currently well  controlled but has been as high as 182/71 since admission. - Home medications include Amlodipine 5mg  daily, Lopressor 37.5mg  twice daily, Hydralazine 100mg  three times daily, and Imdur 60mg  daily. - Lopressor was continued but all other medications held on admission due to significant GI bleed. - will add back amlodipine today - Continue to monitor. Can add back hydralazine/imdur tomorrow if BP allows. Suspect he will not require full home hydralazine dose. - if EF reduced, may need to consolidate metoprolol into succinate.  Hyperlipidemia  - Continue home Crestor 20mg  daily.  Diabetes Mellitus - Continue sliding scale per primary team.  CKD Stage III-IV - Creatinine 1.91 on admission. Baseline around 1.6 to 1.8.  - Creatinine 1.86 today,  down from 2.12 yesterday.  - Continue daily BMET.  Otherwise, per primary team. For questions or updates, please contact Welcome Please consult [email or web address] for contact info under        Signed, Buford Dresser, MD  08/29/2019, 1:22 PM

## 2019-08-29 NOTE — Anesthesia Procedure Notes (Signed)
Procedure Name: MAC Date/Time: 08/29/2019 9:20 AM Performed by: Marsa Aris, CRNA Pre-anesthesia Checklist: Patient identified, Emergency Drugs available, Suction available, Patient being monitored and Timeout performed Patient Re-evaluated:Patient Re-evaluated prior to induction Oxygen Delivery Method: Nasal cannula Preoxygenation: Pre-oxygenation with 100% oxygen Airway Equipment and Method: Bite block

## 2019-08-30 ENCOUNTER — Other Ambulatory Visit: Payer: Self-pay | Admitting: Internal Medicine

## 2019-08-30 DIAGNOSIS — K5520 Angiodysplasia of colon without hemorrhage: Secondary | ICD-10-CM

## 2019-08-30 DIAGNOSIS — N1832 Chronic kidney disease, stage 3b: Secondary | ICD-10-CM

## 2019-08-30 DIAGNOSIS — E785 Hyperlipidemia, unspecified: Secondary | ICD-10-CM

## 2019-08-30 LAB — CBC WITH DIFFERENTIAL/PLATELET
Abs Immature Granulocytes: 0.04 10*3/uL (ref 0.00–0.07)
Basophils Absolute: 0 10*3/uL (ref 0.0–0.1)
Basophils Relative: 1 %
Eosinophils Absolute: 0.1 10*3/uL (ref 0.0–0.5)
Eosinophils Relative: 1 %
HCT: 31.4 % — ABNORMAL LOW (ref 39.0–52.0)
Hemoglobin: 9.5 g/dL — ABNORMAL LOW (ref 13.0–17.0)
Immature Granulocytes: 1 %
Lymphocytes Relative: 9 %
Lymphs Abs: 0.8 10*3/uL (ref 0.7–4.0)
MCH: 22.9 pg — ABNORMAL LOW (ref 26.0–34.0)
MCHC: 30.3 g/dL (ref 30.0–36.0)
MCV: 75.8 fL — ABNORMAL LOW (ref 80.0–100.0)
Monocytes Absolute: 1.1 10*3/uL — ABNORMAL HIGH (ref 0.1–1.0)
Monocytes Relative: 13 %
Neutro Abs: 6.7 10*3/uL (ref 1.7–7.7)
Neutrophils Relative %: 75 %
Platelets: 266 10*3/uL (ref 150–400)
RBC: 4.14 MIL/uL — ABNORMAL LOW (ref 4.22–5.81)
RDW: 21 % — ABNORMAL HIGH (ref 11.5–15.5)
WBC: 8.9 10*3/uL (ref 4.0–10.5)
nRBC: 0 % (ref 0.0–0.2)

## 2019-08-30 LAB — BASIC METABOLIC PANEL
Anion gap: 13 (ref 5–15)
BUN: 27 mg/dL — ABNORMAL HIGH (ref 8–23)
CO2: 24 mmol/L (ref 22–32)
Calcium: 9.1 mg/dL (ref 8.9–10.3)
Chloride: 100 mmol/L (ref 98–111)
Creatinine, Ser: 1.97 mg/dL — ABNORMAL HIGH (ref 0.61–1.24)
GFR calc Af Amer: 37 mL/min — ABNORMAL LOW (ref >60)
GFR calc non Af Amer: 32 mL/min — ABNORMAL LOW (ref >60)
Glucose, Bld: 197 mg/dL — ABNORMAL HIGH (ref 70–99)
Potassium: 4.2 mmol/L (ref 3.5–5.1)
Sodium: 137 mmol/L (ref 135–145)

## 2019-08-30 LAB — GLUCOSE, CAPILLARY
Glucose-Capillary: 193 mg/dL — ABNORMAL HIGH (ref 70–99)
Glucose-Capillary: 165 mg/dL — ABNORMAL HIGH (ref 70–99)

## 2019-08-30 MED ORDER — APIXABAN 5 MG PO TABS: 5.0000 mg | ORAL_TABLET | Freq: Two times a day (BID) | ORAL | 6 refills | Status: DC

## 2019-08-30 MED ORDER — HYDRALAZINE HCL 25 MG PO TABS
25.0000 mg | ORAL_TABLET | Freq: Three times a day (TID) | ORAL | Status: DC
  Administered 2019-08-30: 25 mg via ORAL
  Filled 2019-08-30: qty 1

## 2019-08-30 MED ORDER — FUROSEMIDE 40 MG PO TABS: 60.0000 mg | ORAL_TABLET | Freq: Every day | ORAL | Status: DC

## 2019-08-30 MED ORDER — FERROUS SULFATE 325 (65 FE) MG PO TABS: 325.0000 mg | ORAL_TABLET | Freq: Every day | ORAL | 0 refills | Status: DC

## 2019-08-30 MED ORDER — HYDRALAZINE HCL 50 MG PO TABS: 50.0000 mg | ORAL_TABLET | Freq: Three times a day (TID) | ORAL | Status: DC

## 2019-08-30 MED ORDER — HYDRALAZINE HCL 100 MG PO TABS: 50.0000 mg | ORAL_TABLET | Freq: Three times a day (TID) | ORAL | 3 refills | Status: DC

## 2019-08-30 MED ORDER — PANTOPRAZOLE SODIUM 40 MG PO TBEC: 40.0000 mg | DELAYED_RELEASE_TABLET | Freq: Every day | ORAL | 0 refills | Status: DC

## 2019-08-30 MED ORDER — CYANOCOBALAMIN 1000 MCG PO TABS: 1000.0000 ug | ORAL_TABLET | Freq: Every day | ORAL | 0 refills | Status: AC

## 2019-08-30 NOTE — Evaluation (Signed)
Physical Therapy Evaluation and Discharge Patient Details Name: Jake Gross MRN: [id number] DOB: [birth year] Today's Date: 08/30/2019   History of Present Illness  78 y.o. male with medical history significant for CAD status post CABG in 11/2018, hypertension, hyperlipidemia, CKD, CVA, A. fib, colon CA in 2016, diabetes mellitus, presented 08/27/19 to the ED complaining of significant shortness of breath, generalized weakness for the past couple of weeks. Hgb 5.2, acute on chronic CHF with cardioloyg consult--elevated troponin due to demand ischemia. 08/29/19 endoscopy and colonoscopy with suspected upper GI bleeed  Clinical Impression   Patient evaluated by Physical Therapy with no further acute PT needs identified. Pt scored 20/24 on Dynamic Gait Index, above the cut-off score for increased risk of falls. PT is signing off. Thank you for this referral.     Follow Up Recommendations No PT follow up    Equipment Recommendations  None recommended by PT    Recommendations for Other Services       Precautions / Restrictions Precautions Precautions: None Restrictions Weight Bearing Restrictions: No      Mobility  Bed Mobility Overal bed mobility: Modified Independent             General bed mobility comments: uses momentum; HOB 0  Transfers Overall transfer level: Independent               General transfer comment: from bed, toilet  Ambulation/Gait Ambulation/Gait assistance: Independent Gait Distance (Feet): 300 Feet Assistive device: None Gait Pattern/deviations: WFL(Within Functional Limits)   Gait velocity interpretation: 1.31 - 2.62 ft/sec, indicative of limited community ambulator General Gait Details: slightly wide BOS; able to vary speeds, etc (see DGI)  Stairs Stairs: Yes Stairs assistance: Modified independent (Device/Increase time) Stair Management: One rail Left;Two rails;Alternating pattern;Step to pattern;Forwards Number of Stairs:  10 General stair comments: alternating ascending; pt caught foot on 2nd step and caught himself with rail and other hand on step above (pt admits he was rushing and that this is not usual for him to trip); descending step-to wtih bil rails  Wheelchair Mobility    Modified Rankin (Stroke Patients Only)       Balance Overall balance assessment: Independent                               Standardized Balance Assessment Standardized Balance Assessment : Dynamic Gait Index   Dynamic Gait Index Level Surface: Normal Change in Gait Speed: Normal Gait with Horizontal Head Turns: Mild Impairment Gait with Vertical Head Turns: Mild Impairment Gait and Pivot Turn: Normal Step Over Obstacle: Normal Step Around Obstacles: Normal Steps: Moderate Impairment Total Score: 20       Pertinent Vitals/Pain Pain Assessment: No/denies pain    Home Living Family/patient expects to be discharged to:: Private residence Living Arrangements: Alone Available Help at Discharge: Family;Available PRN/intermittently(son) Type of Home: House Home Access: Stairs to enter Entrance Stairs-Rails: Right;Left;Can reach both Entrance Stairs-Number of Steps: 6 Home Layout: One level Home Equipment: None      Prior Function Level of Independence: Independent         Comments: driving     Hand Dominance   Dominant Hand: Right    Extremity/Trunk Assessment   Upper Extremity Assessment Upper Extremity Assessment: Generalized weakness    Lower Extremity Assessment Lower Extremity Assessment: Generalized weakness    Cervical / Trunk Assessment Cervical / Trunk Assessment: Normal  Communication   Communication: No difficulties  Cognition Arousal/Alertness: Awake/alert  Behavior During Therapy: WFL for tasks assessed/performed Overall Cognitive Status: Within Functional Limits for tasks assessed                                        General Comments       Exercises     Assessment/Plan    PT Assessment Patent does not need any further PT services  PT Problem List         PT Treatment Interventions      PT Goals (Current goals can be found in the Care Plan section)  Acute Rehab PT Goals PT Goal Formulation: All assessment and education complete, DC therapy    Frequency     Barriers to discharge        Co-evaluation               AM-PAC PT "6 Clicks" Mobility  Outcome Measure Help needed turning from your back to your side while in a flat bed without using bedrails?: None Help needed moving from lying on your back to sitting on the side of a flat bed without using bedrails?: None Help needed moving to and from a bed to a chair (including a wheelchair)?: None Help needed standing up from a chair using your arms (e.g., wheelchair or bedside chair)?: None Help needed to walk in hospital room?: None Help needed climbing 3-5 steps with a railing? : None 6 Click Score: 24    End of Session Equipment Utilized During Treatment: Gait belt Activity Tolerance: Patient tolerated treatment well(HR 66-76 after 1 flight of steps; dyspnea 2/4) Patient left: in chair;with call bell/phone within reach Nurse Communication: Mobility status;Other (comment)(no needs) PT Visit Diagnosis: Difficulty in walking, not elsewhere classified (R26.2)    Time: 0102-7253 PT Time Calculation (min) (ACUTE ONLY): 19 min   Charges:   PT Evaluation $PT Eval Low Complexity: 1 Low           Barry Brunner, PT Pager [phone]   Rexanne Mano 08/30/2019, 9:55 AM

## 2019-08-30 NOTE — Plan of Care (Signed)
  Problem: Pain Managment: Goal: General experience of comfort will improve Outcome: Progressing   Problem: Safety: Goal: Ability to remain free from injury will improve Outcome: Progressing   Problem: Skin Integrity: Goal: Risk for impaired skin integrity will decrease Outcome: Progressing   

## 2019-08-30 NOTE — Discharge Summary (Signed)
Discharge Summary  Melquiades Kovar Needham OFB:[id number] DOB: Aug 29, 1941  PCP: Jearld Fenton, NP  Admit date: 08/27/2019 Discharge date: 08/30/2019  Time spent: 40 mins  Recommendations for Outpatient Follow-up:  1. PCP in 1 week 2. GI follow-up as scheduled 3. 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    Discharge Condition: Stable  Diet recommendation: Heart healthy/mod carb  Vitals:   08/30/19 0505 08/30/19 1222  BP: (!) 148/62 (!) 151/63  Pulse: (!) 51 (!) 51  Resp: 16 16  Temp: 97.7 F (36.5 C) 98.5 F (36.9 C)  SpO2: 97% 100%    History of present illness:  Nitish S Councilis a 78 y.o.malewith medical history significant forCAD status post CABG in 11/2018, hypertension, hyperlipidemia, CKD, CVA, A. fib, colon CA in 2016, diabetes mellitus, presents to the ED complaining of significant shortness of breath, generalized weakness for the past couple of weeks. Patient was at his outpatient cardiology clinic for follow-up appointment as patient has had difficultywith managing his diastolic heart failure as well as uncontrolled hypertension,was noted to be significantly short of breath. Due  to persistent dyspnea, patient was advised to go to the ER via EMS. Patient denied any associated chest pain, fever/chills, cough, abdominal pain, nausea/vomiting, hematemesis, melena. Denies any sick contacts. In the ED, labs showed a hemoglobin of 5.2, baseline around 10, creatinine of 1.91, BNP of 502.5, troponin 1181-->1320,anemia panel showed iron of 15, TIBC 500, sats 3, ferritin 6 vitamin B12 109.FOBT positive. CT abdomen pelvis showed cirrhosis, cholelithiasis,chest x-ray showed cardiomegaly with pulmonary vascular engorgement, no focal infiltrates. Cardiology and GI consulted.    Today, patient denies any new complaints, denies any abdominal pain, bleeding per rectum, melena, chest pain, worsening shortness of breath, fever/chills.  Patient ambulated well with physical therapy.  Patient is stable for discharge with close follow-up with PCP/cardiology/GI.  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   HLD (hyperlipidemia)   S/P CABG x 4   Acute on chronic diastolic CHF (congestive heart failure) ([facility])   Paroxysmal atrial fibrillation ([facility])   CKD (chronic kidney disease)   History of CVA (cerebrovascular accident)   Iron deficiency anemia due to chronic blood loss   Heme positive stool   Chronic anticoagulation   AVM (arteriovenous malformation) of small bowel, acquired   Possible upper GI bleed 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 Colonoscopy showed 3  3-5 mm polyps in the transverse/ascending colon,  removed with cold snare Protonix daily Follow-up with GI as scheduled  Acute on chronic blood loss anemia/iron deficiency Hemoglobin 5.2 on presentation, baseline around 10 Anemia panel with iron 15, sats 3,TIBC 500, ferritin 6, vitamin B12 109 Transfused 3 units of PRBC on 08/27/2019, with improvement in hemoglobin Vitamin B12 supplementation S/P 1 dose of IV Feraheme on 08/28/2019 Start p.o. iron supplementation Follow-up with PCP/GI with repeat labs  CKD stage IIIb Baseline creatinine around 1.8-2.0 Currently around baseline Daily BMP  Acute on chronic diastolic HF Appeared slightly overloaded, with trace edema on presentation Echo done on 2/20 showed EF of 60 to 65%, impaired diastolic relaxation Continue home p.o. Lasix Follow-up with cardiology as scheduled  Elevated troponin/history of CAD status post [id number] on 2/20 Currently chest pain-free Likely demand ischemia 2/2 symptomatic anemia EKG with no acute ST changes Continue to hold aspirin  Paroxysmal A. Fib Currently rate controlled EKG with sinus rhythm Continue metoprolol Hold Eliquis, plan to restart on 09/05/2019 as per GI  Diabetes mellitus type 2 A1c 5.8 on 9/20 Continue home regimen  Hypertension Restart home meds Follow-up with PCP, cardiology  History of cryptogenic stroke in 2018 Continue to hold Eliquis as mentioned above  Hyperlipidemia Continue Crestor  History ofColon cain 2016 Status post surgery Outpatient follow-up  Obesity Lifestyle modification advised         Malnutrition Type:      Malnutrition Characteristics:      Nutrition Interventions:      Estimated body mass index is 31.79 kg/m as calculated from the following:   Height as of this encounter: 5\' 7"  (1.702 m).   Weight as of this encounter: 92.1 kg.    Procedures:  EGD and colonoscopy on 08/29/2019  Consultations:  GI  Cardiology  Discharge Exam: BP (!) 151/63 (BP Location:  Right Arm)    Pulse (!) 51    Temp 98.5 F (36.9 C) (Axillary)    Resp 16    Ht 5\' 7"  (1.702 m)    Wt 92.1 kg    SpO2 100%    BMI 31.79 kg/m   General: NAD Cardiovascular: S1, S2 present Respiratory: Diminished breath sounds bilaterally  Discharge Instructions You were cared for by a hospitalist during your hospital stay. If you have any questions about your discharge medications or the care you received while you were in the hospital after you are discharged, you can call the unit and asked to speak with the hospitalist on call if the hospitalist that took care of you is not available. Once you are discharged, your primary care physician will handle any further medical issues. Please note that NO REFILLS for any discharge medications will be authorized once you are discharged, as it is imperative that you return to your primary care physician (or establish a relationship with a primary care physician if you do not have one) for your aftercare needs so that they can reassess your need for medications and monitor your lab values.  Discharge Instructions    Diet - low sodium heart healthy   Complete by: As directed    Increase activity slowly   Complete by: As directed      Allergies as of 08/30/2019   No Known Allergies     Medication List    STOP taking these medications   aspirin EC 81 MG tablet     TAKE these medications   amLODipine  5 MG tablet Commonly known as: NORVASC Take 1 tablet (5 mg total) by mouth daily.   apixaban 5 MG Tabs tablet Commonly known as: Eliquis Take 1 tablet (5 mg total) by mouth 2 (two) times daily. This medication is on hold because of your recent bleeding. Do not take this medication until 09/05/19 What changed: additional instructions   cyanocobalamin 1000 MCG tablet Take 1 tablet (1,000 mcg total) by mouth daily. Start taking on: August 31, 2019   fenofibrate micronized 67 MG capsule Commonly known as: LOFIBRA TAKE 1 CAPSULE DAILY BEFORE  BREAKFAST What changed: See the new instructions.   ferrous sulfate 325 (65 FE) MG tablet Take 1 tablet (325 mg total) by mouth daily.   furosemide 40 MG tablet Commonly known as: LASIX Take 60 mg by mouth daily. 1.5 tablet daily   hydrALAZINE 100 MG tablet Commonly known as: APRESOLINE Take 0.5 tablets (50 mg total) by mouth 3 (three) times daily. What changed: how much to take   isosorbide mononitrate 60 MG 24 hr tablet Commonly known as: IMDUR Take 1 tablet (60 mg total) by mouth daily.   Lantus SoloStar 100 UNIT/ML Solostar Pen Generic drug: Insulin Glargine Inject 60 Units into the skin daily. And pen needles 1/day   metoprolol tartrate 25 MG tablet Commonly known as: LOPRESSOR Take 1.5 tablets (37.5 mg total) by mouth 2 (two) times daily.   ONE TOUCH ULTRA TEST test strip Generic drug: glucose blood USE 1 STRIP TWICE A DAY   OneTouch Delica Lancets 93G Misc   pantoprazole 40 MG tablet Commonly known as: PROTONIX Take 1 tablet (40 mg total) by mouth daily before breakfast. Start taking on: August 31, 2019   potassium chloride 10 MEQ tablet Commonly known as: KLOR-CON Take 20 mEq by mouth daily.   rosuvastatin 20 MG tablet Commonly known as: CRESTOR Take 1 tablet (20 mg total) by mouth daily.      No Known Allergies Follow-up Information    Loralie Champagne, PA-C Follow up on 09/02/2019.   Specialty: Gastroenterology Why: 11 AM with GI PA to evaluate anemia.   Contact information: [place] [address] [phone]        Imogene Burn, PA-C Follow up.   Specialty: Cardiology Why: You have a virtual follow-up visit (phone vs video) with Ermalinda Barrios, PA-C, scheduled for 09/16/2019 at 10am and then an in-office visit scheduled fro 09/30/2019 at 11:00am. If these dates/times do no work for you, please call our office to reschedule. Contact information: [address] [place] [address] [phone]         Jearld Fenton, NP. Schedule an appointment as soon as possible for a visit in 1 week(s).   Specialties: Internal Medicine, Emergency Medicine Contact information: [place] [address] [phone]        Thompson Grayer, MD .   Specialty: Cardiology Contact information: [place] [address] [phone]        Burnell Blanks, MD .   Specialty: Cardiology Contact information: [place]. [address] [place] [address] [phone]            The results of significant diagnostics from this hospitalization (including imaging, microbiology, ancillary and laboratory) are listed below for reference.    Significant Diagnostic Studies: Ct Abdomen Pelvis Wo Contrast  Result Date: 08/27/2019 CLINICAL DATA:  GI bleed. EXAM: CT ABDOMEN AND PELVIS WITHOUT CONTRAST TECHNIQUE:  Multidetector CT imaging of the abdomen and pelvis was performed following the standard protocol without IV contrast. COMPARISON:  CT scan dated 02/15/2015 FINDINGS: Lower chest: Extensive coronary artery calcification. Aortic atherosclerosis. Small right pleural effusion. Hepatobiliary: Nodular liver parenchyma suggesting cirrhosis. The liver is smaller than on the prior study. Large calcified gallstone. Gallbladder wall is not thickened. No bile duct dilatation. Pancreas: Unremarkable. No pancreatic ductal dilatation or surrounding inflammatory changes. Spleen: Normal in size without focal abnormality. Adrenals/Urinary Tract: Adrenal glands and kidneys are normal. No hydronephrosis. Bladder is normal. Stomach/Bowel: Stomach is within normal limits. Appendix appears normal. No evidence of bowel wall thickening, distention, or inflammatory changes. Vascular/Lymphatic: Aortic atherosclerosis. No enlarged abdominal or pelvic lymph nodes. Reproductive: The prostate gland is enlarged, measuring 6.2 cm in diameter. Other: No abdominal wall hernia or  abnormality. No abdominopelvic ascites. Musculoskeletal: No acute or significant osseous findings. IMPRESSION: 1. Small right pleural effusion. 2. Cirrhosis. 3. Cholelithiasis. 4. Enlarged prostate gland. 5. Aortic atherosclerosis. 6. No acute abnormalities. Aortic Atherosclerosis (ICD10-I70.0). Electronically Signed   By: Lorriane Shire M.D.   On: 08/27/2019 18:31   Dg Chest 2 View  Result Date: 08/27/2019 CLINICAL DATA:  Shortness of breath, 3 weeks duration. EXAM: CHEST - 2 VIEW COMPARISON:  01/01/2019 FINDINGS: Heart size remains enlarged following median sternotomy and CABG with cardiac loop recorder projecting over left cardiac border. Lungs are clear. Pulmonary vascular engorgement is present without frank edema. No signs of acute bone finding. IMPRESSION: Cardiomegaly and pulmonary vascular engorgement without frank edema. No focal infiltrate. Electronically Signed   By: Zetta Bills M.D.   On: 08/27/2019 12:03    Microbiology: Recent Results (from the past 240 hour(s))  SARS CORONAVIRUS 2 (TAT 6-24 HRS) Nasopharyngeal Nasopharyngeal Swab     Status: None   Collection Time: 08/27/19  3:25 PM   Specimen: Nasopharyngeal Swab  Result Value Ref Range Status   SARS Coronavirus 2 NEGATIVE NEGATIVE Final    Comment: (NOTE) SARS-CoV-2 target nucleic acids are NOT DETECTED. The SARS-CoV-2 RNA is generally detectable in upper and lower respiratory specimens during the acute phase of infection. Negative results do not preclude SARS-CoV-2 infection, do not rule out co-infections with other pathogens, and should not be used as the sole basis for treatment or other patient management decisions. Negative results must be combined with clinical observations, patient history, and epidemiological information. The expected result is Negative. Fact Sheet for Patients: SugarRoll.be Fact Sheet for Healthcare Providers: https://www.woods-mathews.com/ This test  is not yet approved or cleared by the [place] FDA and  has been authorized for detection and/or diagnosis of SARS-CoV-2 by FDA under an Emergency Use Authorization (EUA). This EUA will remain  in effect (meaning this test can be used) for the duration of the COVID-19 declaration under Section 56 4(b)(1) of the Act, 21 U.S.C. section 360bbb-3(b)(1), unless the authorization is terminated or revoked sooner. Performed at [facility], [place] [address] [place], [place] [address]      Labs: Basic Metabolic Panel: Recent Labs  Lab 08/27/19 1142 08/28/19 0640 08/29/19 0523 08/30/19 0546  NA 140 142 138 137  K 4.4 4.0 3.6 4.2  CL 110 107 103 100  CO2 19* 24 22 24   GLUCOSE 210* 68* 96 197*  BUN 37* 34* 28* 27*  CREATININE 1.91* 2.12* 1.86* 1.97*  CALCIUM 9.1 8.9 9.0 9.1   Liver Function Tests: No results for input(s): AST, ALT, ALKPHOS, BILITOT, PROT, ALBUMIN in the last 168 hours. No results for input(s):  LIPASE, AMYLASE in the last 168 hours. No results for input(s): AMMONIA in the last 168 hours. CBC: Recent Labs  Lab 08/28/19 2323 08/29/19 0535 08/29/19 1517 08/29/19 2306 08/30/19 0546  WBC 10.8* 8.9 6.8 7.9 8.9  NEUTROABS  --   --   --   --  6.7  HGB 8.2* 9.3* 9.3* 8.9* 9.5*  HCT 26.9* 30.3* 29.9* 28.7* 31.4*  MCV 76.0* 75.6* 75.3* 74.4* 75.8*  PLT 259 282 245 244 266   Cardiac Enzymes: No results for input(s): CKTOTAL, CKMB, CKMBINDEX, TROPONINI in the last 168 hours. BNP: BNP (last 3 results) Recent Labs    08/27/19 1142  BNP 502.5*    ProBNP (last 3 results) Recent Labs    05/19/19 1300 06/02/19 1515 06/09/19 1144  PROBNP 744* 677* 775*    CBG: Recent Labs  Lab 08/29/19 1047 08/29/19 1601 08/29/19 2206 08/30/19 0506 08/30/19 1105  GLUCAP 100* 116* 165* 193* 165*       Signed:  Alma Friendly, MD [facility] 08/30/2019, 12:43 PM

## 2019-08-30 NOTE — Progress Notes (Signed)
Echo showed normal LV systolic function and mild to moderate MR. I dc IV Lasix given rising creatinine and ordered resumption of oral Lasix on 11/15. Will sign off.

## 2019-09-01 ENCOUNTER — Encounter (HOSPITAL_COMMUNITY): Payer: Self-pay | Admitting: Gastroenterology

## 2019-09-01 ENCOUNTER — Telehealth: Payer: Self-pay

## 2019-09-01 LAB — SURGICAL PATHOLOGY

## 2019-09-01 MED ORDER — FENOFIBRATE MICRONIZED 67 MG PO CAPS: ORAL_CAPSULE | ORAL | 0 refills | Status: DC

## 2019-09-01 NOTE — Telephone Encounter (Signed)
Please review patient concern section  Transition Care Management Follow-up Telephone Call   Date discharged? 08/30/2019   How have you been since you were released from the hospital? Spoke with patient, he has not had a bowel movement since 08/27/2019, he feels that the stool is right there but will not come out. He has not tried anything for constipation. Otherwise feels great. No more SOB.   Do you understand why you were in the hospital? yes   Do you understand the discharge instructions? yes   Where were you discharged to? Home  Items Reviewed:  Medications reviewed: yes- not to take Eliquis for 7 days  Allergies reviewed: yes  Dietary changes reviewed: yes  Referrals reviewed: f/u with GI   Functional Questionnaire:   Activities of Daily Living (ADLs):   He states they are independent in the following: ambulation, dressing, bathing, hygiene, toileting, fixing food and medication. States they require assistance with the following: none   Any transportation issues/concerns?: NO   Any patient concerns?  he has not had a bowel movement since 08/27/2019, he feels that the stool is right there but will not come out. He has not tried anything for constipation. What can patient take to help?   Confirmed importance and date/time of follow-up visits scheduled yes  Provider Appointment booked with Webb Silversmith, NP on 09/08/2019  Confirmed with patient if condition begins to worsen call PCP or go to the ER.  Patient was given the office number and encouraged to call back with question or concerns.  : yes

## 2019-09-01 NOTE — Telephone Encounter (Signed)
Pt left v/m requesting cb about refill.

## 2019-09-02 ENCOUNTER — Ambulatory Visit (INDEPENDENT_AMBULATORY_CARE_PROVIDER_SITE_OTHER): Payer: Medicare Other | Admitting: Gastroenterology

## 2019-09-02 ENCOUNTER — Telehealth: Payer: Self-pay | Admitting: General Surgery

## 2019-09-02 ENCOUNTER — Encounter: Payer: Self-pay | Admitting: Gastroenterology

## 2019-09-02 VITALS — BP 150/70 | HR 56 | Temp 98.4°F | Ht 67.0 in | Wt 205.2 lb

## 2019-09-02 DIAGNOSIS — D508 Other iron deficiency anemias: Secondary | ICD-10-CM

## 2019-09-02 DIAGNOSIS — K5521 Angiodysplasia of colon with hemorrhage: Secondary | ICD-10-CM

## 2019-09-02 DIAGNOSIS — I25810 Atherosclerosis of coronary artery bypass graft(s) without angina pectoris: Secondary | ICD-10-CM

## 2019-09-02 HISTORY — DX: Angiodysplasia of colon with hemorrhage: K55.21

## 2019-09-02 NOTE — Progress Notes (Signed)
09/02/2019 [place] [id number] [birth year]   HISTORY OF PRESENT ILLNESS:  This is a 78 year old male who was previously a patient of Dr. Kelby Fam.  His care will now be assumed by Dr. Henrene Pastor who saw him last week in hospital consult.  Was admitted for iron deficiency anemia.  Hemoglobin was 5.2 g on admission and at discharge on November 14 it was up to 9.5 g after 3 units of packed red blood cells and 1 dose of IV Feraheme.  B12 levels found to be low as well.  He was placed on oral B12 supplement.  Also discharged on ferrous sulfate 325 mg daily.  He underwent colonoscopy and EGD by Dr. Loletha Carrow while in the hospital that showed the following:  Colonoscopy:  - Enlarged prostate found on digital rectal exam. - Three 3 to 5 mm polyps in the transverse colon and in the ascending colon, removed with a cold snare. Resected and retrieved. Clip (MR conditional) was placed. - TEM anastomosis, characterized by healthy appearing mucosa. - The examination was otherwise normal.  EGD:  - Normal esophagus. - Normal stomach. - Two non-bleeding angioectasias in the duodenum. Treated with argon plasma coagulation (APC). Probable source of chronic blood loss, often with more distal small bowel AVMs as well.  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   Past Medical History:  Diagnosis Date  . 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 Arthritis   . Cervical spondylosis   . Diabetes mellitus   . Hyperlipidemia   . Hypertension   . Stroke ([place]) 06/19/2017  . Vitamin D deficiency    Past Surgical History:  Procedure Laterality Date  . ABCESS DRAINAGE Left    buttocks  . CARDIOVASCULAR STRESS TEST  10/12/1999   EF 63%. NO ISCHEMIA  . COLONOSCOPY W/ POLYPECTOMY     5 polyps  . COLONOSCOPY WITH PROPOFOL N/A 08/29/2019   Procedure: COLONOSCOPY WITH PROPOFOL;  Surgeon: Doran Stabler, MD;  Location: [place];  Service: Gastroenterology;  Laterality: N/A;  . CORONARY ARTERY BYPASS GRAFT N/A 11/26/2018   Procedure: CORONARY ARTERY BYPASS GRAFTING (CABG), ON PUMP, TIMES FOUR, USING LEFT INTERNAL MAMMARY ARTERY AND ENDOSCOPICALLY HARVESTED LEFT SAPHENOUS VEIN;  Surgeon: Gaye Pollack, MD;  Location: [place];  Service: Open Heart Surgery;  Laterality: N/A;  . ESOPHAGOGASTRODUODENOSCOPY (EGD) WITH PROPOFOL N/A 08/29/2019   Procedure: ESOPHAGOGASTRODUODENOSCOPY (EGD) WITH PROPOFOL;  Surgeon: Doran Stabler, MD;  Location: [place];  Service: Gastroenterology;  Laterality: N/A;  . EUS N/A 03/11/2015   Procedure: LOWER ENDOSCOPIC ULTRASOUND (EUS);  Surgeon: Milus Banister, MD;  Location: [facility];  Service: Endoscopy;  Laterality: N/A;  . FLEXIBLE SIGMOIDOSCOPY N/A 02/02/2015   Procedure: FLEXIBLE SIGMOIDOSCOPY;  Surgeon: Inda Castle, MD;  Location: [facility];  Service: Endoscopy;  Laterality: N/A;  ERBE  . HEMOSTASIS CLIP PLACEMENT  08/29/2019   Procedure: HEMOSTASIS CLIP PLACEMENT;  Surgeon: Doran Stabler, MD;  Location: [place];  Service: Gastroenterology;;  . HOT HEMOSTASIS N/A 08/29/2019   Procedure: HOT HEMOSTASIS (ARGON PLASMA COAGULATION/BICAP);  Surgeon: Doran Stabler, MD;  Location: [place];  Service: Gastroenterology;  Laterality: N/A;  . LEFT HEART CATH AND CORONARY ANGIOGRAPHY N/A 11/20/2018   Procedure: LEFT HEART CATH AND CORONARY ANGIOGRAPHY;  Surgeon: Burnell Blanks, MD;  Location: [place] [facility];  Service: Cardiovascular;  Laterality: N/A;  . LOOP RECORDER INSERTION N/A 08/14/2017   Procedure: LOOP RECORDER INSERTION;  Surgeon: Thompson Grayer, MD;  Location: [place] [facility];  Service: Cardiovascular;  Laterality: N/A;  . PARTIAL PROCTECTOMY BY TEM N/A 04/08/2015   Procedure: TEM PARTIAL PROCTECTOMY OF RECTAL MASS;  Surgeon: Michael Boston, MD;  Location: [facility];  Service: General;  Laterality: N/A;  .  POLYPECTOMY  08/29/2019   Procedure: POLYPECTOMY;  Surgeon: Doran Stabler, MD;  Location: [facility];  Service: Gastroenterology;;  . TEE WITHOUT CARDIOVERSION N/A 06/25/2017   Procedure: TRANSESOPHAGEAL ECHOCARDIOGRAM (TEE);  Surgeon: Skeet Latch, MD;  Location: [place];  Service: Cardiovascular;  Laterality: N/A;  . TEE WITHOUT CARDIOVERSION N/A 11/26/2018   Procedure: TRANSESOPHAGEAL ECHOCARDIOGRAM (TEE);  Surgeon: Gaye Pollack, MD;  Location: [place];  Service: Open Heart Surgery;  Laterality: N/A;  . TONSILLECTOMY AND ADENOIDECTOMY     as child    reports that he quit smoking about 25 years ago. His smoking use included cigarettes. He has never used smokeless tobacco. He reports that he does not drink alcohol or use drugs. family history includes Cancer in his brother and father; Coronary artery disease in his brother; Diabetes in his brother and mother; Stroke in his mother. No Known Allergies    Outpatient Encounter Medications as of 09/02/2019  Medication Sig  . amLODipine (NORVASC) 5 MG tablet Take 1 tablet (5 mg total) by mouth daily.  Marland Kitchen apixaban (ELIQUIS) 5 MG TABS tablet Take 1 tablet (5 mg total) by mouth 2 (two) times daily. This medication is on hold because of your recent bleeding. Do not take this medication until 09/05/19  . fenofibrate micronized (LOFIBRA) 67 MG capsule TAKE 1 CAPSULE DAILY BEFORE BREAKFAST  . ferrous sulfate 325 (65 FE) MG tablet Take 1 tablet (325 mg total) by mouth daily.  . furosemide (LASIX) 40 MG tablet Take 60 mg by mouth daily. 1.5 tablet daily  . hydrALAZINE (APRESOLINE) 100 MG tablet Take 0.5 tablets (50 mg total) by mouth 3 (three) times daily.  . Insulin Glargine (LANTUS SOLOSTAR) 100 UNIT/ML Solostar Pen Inject 60 Units into the skin daily. And pen needles 1/day  . isosorbide mononitrate (IMDUR) 60 MG 24 hr tablet Take 1 tablet (60 mg total) by mouth daily.  . metoprolol tartrate (LOPRESSOR) 25 MG tablet Take 1.5 tablets (37.5  mg total) by mouth 2 (two) times daily.  . ONE TOUCH ULTRA TEST test strip USE 1 STRIP TWICE A DAY  . ONETOUCH DELICA LANCETS 13K MISC   . pantoprazole (PROTONIX) 40 MG tablet Take 1 tablet (40 mg total) by mouth daily before breakfast.  . potassium chloride (K-DUR) 10 MEQ tablet Take 20 mEq by mouth daily.  . [DISCONTINUED] rosuvastatin (CRESTOR) 20 MG tablet Take 1 tablet (20 mg total) by mouth daily.  . vitamin B-12 1000 MCG tablet Take 1 tablet (1,000 mcg total) by mouth daily.   No facility-administered encounter medications on file as of 09/02/2019.      REVIEW OF SYSTEMS  : All other systems reviewed and negative except where noted in the History of Present Illness.   PHYSICAL EXAM: BP (!) 150/70 (BP Location: Left Arm, Patient Position: Sitting, Cuff Size: Normal)   Pulse (!) 56   Temp 98.4 F (36.9 C)   Ht 5\' 7"  (1.702 m)   Wt 205 lb 4 oz (93.1 kg)   BMI 32.15 kg/m  General: Well developed white male in no acute distress Head: Normocephalic and atraumatic  Eyes:  Sclerae anicteric, conjunctiva pink. Ears: Normal auditory acuity Lungs: Clear throughout to auscultation; no increased WOB. Heart: Regular rate and rhythm; no M/R/G. Abdomen: Soft, non-distended.  BS present.  Non-tender. Musculoskeletal: Symmetrical with no gross deformities  Skin: No lesions on visible extremities Extremities: No edema  Neurological: Alert oriented x 4, grossly non-focal Psychological:  Alert and cooperative. Normal mood and affect  ASSESSMENT AND PLAN: *IDA:  Hgb was down to 5.2 grams on admission, up to 9.5 grams at discharge after 3 units PRBC's and one dose of IV iron.  Found to have 2 non-bleeding AVMs in the duodenum that were APC'ed.  Could very well have more further down in the small bowel.  We will schedule for his second dose of IV iron/Feraheme.  Otherwise from there his PCP can monitor his hemoglobin closely.  No further GI evaluation for now unless hemoglobin once again begins  to trend down despite oral iron supplementation as well.  If so the next that would be wireless capsule endoscopy while on Eliquis.   *Vitamin B12 deficiency: On oral iron supplementation currently, but PCP may want to consider B12 injections. 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   CC:  Jearld Fenton, NP

## 2019-09-02 NOTE — Telephone Encounter (Signed)
Pt received his first dose inpt on 11/12, he would be due for 2nd one 11/19. Pt care center states they do not have a spot for 2 weeks. Called [facility] and left message for pt to call back.

## 2019-09-02 NOTE — Patient Instructions (Signed)
If you are age 76 or older, your body mass index should be between 23-30. Your Body mass index is 32.15 kg/m. If this is out of the aforementioned range listed, please consider follow up with your Primary Care Provider.  If you are age 70 or younger, your body mass index should be between 19-25. Your Body mass index is 32.15 kg/m. If this is out of the aformentioned range listed, please consider follow up with your Primary Care Provider.   You are to be scheduled for your next iron infusion. We will contact you with in the next week with your appointment. If you do not hear from our office by 09/09/2019 please contact our office at [phone].  Thank you for choosing me and [facility]

## 2019-09-02 NOTE — Telephone Encounter (Signed)
Called the pharmacy and hey stated that they did not receive the Rx from 08/14/2019 as shown in the chart... Rx sent through e-scribe

## 2019-09-02 NOTE — Telephone Encounter (Signed)
The patient will need to be scheduled for his 2nd Ferheme infusion per Alonza Bogus, PA-C. Notified the patient at his visit we will contact him with his appointment.

## 2019-09-02 NOTE — Progress Notes (Signed)
Agree 

## 2019-09-03 ENCOUNTER — Other Ambulatory Visit: Payer: Self-pay

## 2019-09-03 ENCOUNTER — Encounter: Payer: Self-pay | Admitting: Gastroenterology

## 2019-09-03 ENCOUNTER — Ambulatory Visit (HOSPITAL_COMMUNITY): Payer: Medicare Other | Admitting: Physician Assistant

## 2019-09-03 ENCOUNTER — Ambulatory Visit (INDEPENDENT_AMBULATORY_CARE_PROVIDER_SITE_OTHER): Payer: Medicare Other | Admitting: *Deleted

## 2019-09-03 ENCOUNTER — Encounter (HOSPITAL_COMMUNITY): Payer: Self-pay

## 2019-09-03 DIAGNOSIS — D508 Other iron deficiency anemias: Secondary | ICD-10-CM

## 2019-09-03 DIAGNOSIS — I5032 Chronic diastolic (congestive) heart failure: Secondary | ICD-10-CM

## 2019-09-03 DIAGNOSIS — I480 Paroxysmal atrial fibrillation: Secondary | ICD-10-CM

## 2019-09-03 NOTE — Telephone Encounter (Signed)
Pt scheduled for 2nd Feraheme infusion at [facility] short stay 09/09/19@9am , order in epic. Pt aware of appt.

## 2019-09-04 ENCOUNTER — Ambulatory Visit: Payer: Medicare Other | Admitting: Endocrinology

## 2019-09-04 LAB — CUP PACEART REMOTE DEVICE CHECK
Date Time Interrogation Session: 20201118223740
Implantable Pulse Generator Implant Date: 20181030

## 2019-09-08 ENCOUNTER — Ambulatory Visit (INDEPENDENT_AMBULATORY_CARE_PROVIDER_SITE_OTHER): Payer: Medicare Other | Admitting: Internal Medicine

## 2019-09-08 ENCOUNTER — Other Ambulatory Visit: Payer: Self-pay

## 2019-09-08 ENCOUNTER — Encounter: Payer: Self-pay | Admitting: Internal Medicine

## 2019-09-08 VITALS — BP 154/78 | HR 58 | Temp 97.9°F | Wt 201.0 lb

## 2019-09-08 DIAGNOSIS — I248 Other forms of acute ischemic heart disease: Secondary | ICD-10-CM

## 2019-09-08 DIAGNOSIS — D500 Iron deficiency anemia secondary to blood loss (chronic): Secondary | ICD-10-CM

## 2019-09-08 DIAGNOSIS — N184 Chronic kidney disease, stage 4 (severe): Secondary | ICD-10-CM | POA: Diagnosis not present

## 2019-09-08 DIAGNOSIS — D62 Acute posthemorrhagic anemia: Secondary | ICD-10-CM | POA: Diagnosis not present

## 2019-09-08 DIAGNOSIS — E538 Deficiency of other specified B group vitamins: Secondary | ICD-10-CM

## 2019-09-08 DIAGNOSIS — E875 Hyperkalemia: Secondary | ICD-10-CM

## 2019-09-08 DIAGNOSIS — K635 Polyp of colon: Secondary | ICD-10-CM | POA: Diagnosis not present

## 2019-09-08 DIAGNOSIS — I998 Other disorder of circulatory system: Secondary | ICD-10-CM

## 2019-09-08 DIAGNOSIS — I5033 Acute on chronic diastolic (congestive) heart failure: Secondary | ICD-10-CM | POA: Diagnosis not present

## 2019-09-08 DIAGNOSIS — N401 Enlarged prostate with lower urinary tract symptoms: Secondary | ICD-10-CM

## 2019-09-08 DIAGNOSIS — R351 Nocturia: Secondary | ICD-10-CM

## 2019-09-08 LAB — CBC WITH DIFFERENTIAL/PLATELET
Basophils Absolute: 0.1 10*3/uL (ref 0.0–0.1)
Basophils Relative: 1.5 % (ref 0.0–3.0)
Eosinophils Absolute: 0.2 10*3/uL (ref 0.0–0.7)
Eosinophils Relative: 1.6 % (ref 0.0–5.0)
HCT: 32.5 % — ABNORMAL LOW (ref 39.0–52.0)
Hemoglobin: 10.2 g/dL — ABNORMAL LOW (ref 13.0–17.0)
Lymphocytes Relative: 11.6 % — ABNORMAL LOW (ref 12.0–46.0)
Lymphs Abs: 1.1 10*3/uL (ref 0.7–4.0)
MCHC: 31.2 g/dL (ref 30.0–36.0)
MCV: 77.4 fl — ABNORMAL LOW (ref 78.0–100.0)
Monocytes Absolute: 0.9 10*3/uL (ref 0.1–1.0)
Monocytes Relative: 9.8 % (ref 3.0–12.0)
Neutro Abs: 7.2 10*3/uL (ref 1.4–7.7)
Neutrophils Relative %: 75.5 % (ref 43.0–77.0)
Platelets: 343 10*3/uL (ref 150.0–400.0)
RBC: 4.2 Mil/uL — ABNORMAL LOW (ref 4.22–5.81)
RDW: 27.5 % — ABNORMAL HIGH (ref 11.5–15.5)
WBC: 9.5 10*3/uL (ref 4.0–10.5)

## 2019-09-08 LAB — VITAMIN B12: Vitamin B-12: 345 pg/mL (ref 211–911)

## 2019-09-08 LAB — COMPREHENSIVE METABOLIC PANEL
ALT: 7 U/L (ref 0–53)
AST: 17 U/L (ref 0–37)
Albumin: 3.3 g/dL — ABNORMAL LOW (ref 3.5–5.2)
Alkaline Phosphatase: 81 U/L (ref 39–117)
BUN: 50 mg/dL — ABNORMAL HIGH (ref 6–23)
CO2: 21 mEq/L (ref 19–32)
Calcium: 9.2 mg/dL (ref 8.4–10.5)
Chloride: 115 mEq/L — ABNORMAL HIGH (ref 96–112)
Creatinine, Ser: 2.93 mg/dL — ABNORMAL HIGH (ref 0.40–1.50)
GFR: 20.97 mL/min — ABNORMAL LOW (ref >60.00)
Glucose, Bld: 73 mg/dL (ref 70–99)
Potassium: 5.5 mEq/L — ABNORMAL HIGH (ref 3.5–5.1)
Sodium: 148 mEq/L — ABNORMAL HIGH (ref 135–145)
Total Bilirubin: 0.6 mg/dL (ref 0.2–1.2)
Total Protein: 6.7 g/dL (ref 6.0–8.3)

## 2019-09-08 LAB — IBC PANEL
Iron: 38 ug/dL — ABNORMAL LOW (ref 42–165)
Saturation Ratios: 12.3 % — ABNORMAL LOW (ref 20.0–50.0)
Transferrin: 221 mg/dL (ref 212.0–360.0)

## 2019-09-08 LAB — FERRITIN: Ferritin: 188.5 ng/mL (ref 22.0–322.0)

## 2019-09-08 MED ORDER — TAMSULOSIN HCL 0.4 MG PO CAPS: 0.4000 mg | ORAL_CAPSULE | Freq: Every day | ORAL | 3 refills | Status: DC

## 2019-09-08 MED ORDER — FENOFIBRATE MICRONIZED 67 MG PO CAPS: ORAL_CAPSULE | ORAL | 0 refills | Status: DC

## 2019-09-08 NOTE — Progress Notes (Signed)
Subjective:    Patient ID: [employer], male    DOB: [birth year], 78 y.o.   MRN: [id number]  HPI  Pt presents to the clinic today for TCM hospital follow up. He went to the ER 08/27/19 with c/o shortness of breath, weakness and fatigue that had started a few weeks prior. Hemaglobin was 5.2%, creatinine 1.91, BNP 500+, B12 190. FOB was positive. CT abdomen showed cirrhosis and cholelithiasis. Chest xray showed pulmonary vascular congestion. GI was consulted. EGD showed 2 areas of nonbleeding angiectasis. Colonscopy showed 3 3-5 mm polpys that were resected. He was started on Pantoprazole. He was also transfused 3 units PRBC, given an iron infusion, started on oral iron and B12. Troponin was elevated but echo showed EF of > 55%. His home ASA and Eliquis was held per GI until 09/05/19. He continued his home Lasix for CHF exacerbation. He was discharged on 08/30/19. Since that time, he still has some fatigue but less shortness of breath. He has never had any chest pain with this. His appetite is okay and bowel are moving normally. He has an appt with cardiology and GI but he can not remember when.  He also c/o urinary leakage and nocturia. This has been going on for some time. He has never taken any medication for this.   Review of Systems  Past Medical History:  Diagnosis Date  . 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 Arthritis   . Cervical spondylosis   . Diabetes mellitus   . Hyperlipidemia   . Hypertension   . Stroke ([place]) 06/19/2017  . Vitamin D deficiency     Current Outpatient Medications  Medication Sig Dispense Refill  . amLODipine (NORVASC) 5 MG tablet Take 1 tablet (5 mg total) by mouth daily. 90 tablet 3  . apixaban (ELIQUIS) 5 MG TABS tablet Take 1 tablet (5 mg total) by mouth 2 (two) times daily. This medication is on hold because of your recent bleeding. Do not take this medication until 09/05/19 60 tablet 6  .  fenofibrate micronized (LOFIBRA) 67 MG capsule TAKE 1 CAPSULE DAILY BEFORE BREAKFAST 90 capsule 0  . ferrous sulfate 325 (65 FE) MG tablet Take 1 tablet (325 mg total) by mouth daily. 30 tablet 0  . furosemide (LASIX) 40 MG tablet Take 60 mg by mouth daily. 1.5 tablet daily    . hydrALAZINE (APRESOLINE) 100 MG tablet Take 0.5 tablets (50 mg total) by mouth 3 (three) times daily. 270 tablet 3  . Insulin Glargine (LANTUS SOLOSTAR) 100 UNIT/ML Solostar Pen Inject 60 Units into the skin daily. And pen needles 1/day 30 mL 11  . isosorbide mononitrate (IMDUR) 60 MG 24 hr tablet Take 1 tablet (60 mg total) by mouth daily. 90 tablet 3  . metoprolol tartrate (LOPRESSOR) 25 MG tablet Take 1.5 tablets (37.5 mg total) by mouth 2 (two) times daily. 270 tablet 3  . ONE TOUCH ULTRA TEST test strip USE 1 STRIP TWICE A DAY 200 each 3  . ONETOUCH DELICA LANCETS 52D MISC     . pantoprazole (PROTONIX) 40 MG tablet Take 1 tablet (40 mg total) by mouth daily before breakfast. 30 tablet 0  . potassium chloride (K-DUR) 10 MEQ tablet Take 20 mEq by mouth daily.    . rosuvastatin (CRESTOR) 20 MG tablet TAKE 1 TABLET BY MOUTH AT BEDTIME NEEDS APPT FOR REFILLS 90 tablet 0  . vitamin B-12 1000 MCG tablet Take 1 tablet (1,000  mcg total) by mouth daily. 30 tablet 0   No current facility-administered medications for this visit.     No Known Allergies  Family History  Problem Relation Age of Onset  . Cancer Father        "all over"  . Coronary artery disease Brother   . Diabetes Brother   . Stroke Mother   . Diabetes Mother   . Cancer Brother   . Colon cancer Neg Hx   . Esophageal cancer Neg Hx   . Rectal cancer Neg Hx   . Stomach cancer Neg Hx     Social History   Socioeconomic History  . Marital status: Divorced    Spouse name: Not on file  . Number of children: 2  . Years of education: Not on file  . Highest education level: Not on file  Occupational History  . Occupation: retired    Fish farm manager: [employer]   Social Needs  . Financial resource strain: Not on file  . Food insecurity    Worry: Not on file    Inability: Not on file  . Transportation needs    Medical: Not on file    Non-medical: Not on file  Tobacco Use  . Smoking status: Former Smoker    Types: Cigarettes    Quit date: 08/13/1994    Years since quitting: 25.0  . Smokeless tobacco: Never Used  Substance and Sexual Activity  . Alcohol use: No    Alcohol/week: 0.0 standard drinks  . Drug use: No  . Sexual activity: Not Currently    Birth control/protection: None  Lifestyle  . Physical activity    Days per week: Not on file    Minutes per session: Not on file  . Stress: Not on file  Relationships  . Social Herbalist on phone: Not on file    Gets together: Not on file    Attends religious service: Not on file    Active member of club or organization: Not on file    Attends meetings of clubs or organizations: Not on file    Relationship status: Not on file  . Intimate partner violence    Fear of current or ex partner: Not on file    Emotionally abused: Not on file    Physically abused: Not on file    Forced sexual activity: Not on file  Other Topics Concern  . Not on file  Social History Narrative  . Not on file     Constitutional: Pt reports fatigue. Denies fever, malaise, headache or abrupt weight changes.  Respiratory: Denies difficulty breathing, shortness of breath, cough or sputum production.   Cardiovascular: Denies chest pain, chest tightness, palpitations or swelling in the hands or feet.  Gastrointestinal: Denies abdominal pain, bloating, constipation, diarrhea or blood in the stool.  GU: Pt reports urinary incontinence and nocturia. Denies urgency, frequency, pain with urination, burning sensation, blood in urine, odor or discharge. Skin: Denies redness, rashes, lesions or ulcercations.  Neurological: Denies dizziness, difficulty with memory, difficulty with speech or problems with balance  and coordination.   No other specific complaints in a complete review of systems (except as listed in HPI above).     Objective:   Physical Exam  BP (!) 154/78   Pulse (!) 58   Temp 97.9 F (36.6 C) (Temporal)   Wt 201 lb (91.2 kg)   SpO2 97%   BMI 31.48 kg/m   Wt Readings from Last 3 Encounters:  09/02/19 205  lb 4 oz (93.1 kg)  08/29/19 203 lb (92.1 kg)  08/27/19 203 lb 6.4 oz (92.3 kg)    General: Appears his stated age, obese, in NAD. Skin: Warm, dry and intact. Cardiovascular: Bradycardic with normal. rhythm. S1,S2 noted.  No murmur, rubs or gallops noted. Trace pitting BLE edema.  Pulmonary/Chest: Normal effort and positive vesicular breath sounds. No respiratory distress. No wheezes, rales or ronchi noted.  Abdomen: Soft, mildly distended. Normal bowel sounds. No distention or masses noted.  Musculoskeletal: Gait slow and steady without device.  Neurological: Alert and oriented.    BMET    Component Value Date/Time   NA 137 08/30/2019 0546   NA 140 06/09/2019 1146   K 4.2 08/30/2019 0546   CL 100 08/30/2019 0546   CO2 24 08/30/2019 0546   GLUCOSE 197 (H) 08/30/2019 0546   BUN 27 (H) 08/30/2019 0546   BUN 32 (H) 06/09/2019 1146   CREATININE 1.97 (H) 08/30/2019 0546   CREATININE 1.14 11/07/2016 1514   CALCIUM 9.1 08/30/2019 0546   GFRNONAA 32 (L) 08/30/2019 0546   GFRAA 37 (L) 08/30/2019 0546    Lipid Panel     Component Value Date/Time   CHOL 138 11/12/2018 1528   TRIG 214.0 (H) 11/12/2018 1528   HDL 25.60 (L) 11/12/2018 1528   CHOLHDL 5 11/12/2018 1528   VLDL 42.8 (H) 11/12/2018 1528   LDLCALC 56 06/22/2017 0516    CBC    Component Value Date/Time   WBC 8.9 08/30/2019 0546   RBC 4.14 (L) 08/30/2019 0546   HGB 9.5 (L) 08/30/2019 0546   HGB 13.9 11/14/2018 1211   HCT 31.4 (L) 08/30/2019 0546   HCT 41.0 11/14/2018 1211   PLT 266 08/30/2019 0546   PLT 261 11/14/2018 1211   MCV 75.8 (L) 08/30/2019 0546   MCV 82 11/14/2018 1211   MCH 22.9 (L)  08/30/2019 0546   MCHC 30.3 08/30/2019 0546   RDW 21.0 (H) 08/30/2019 0546   RDW 13.7 11/14/2018 1211   LYMPHSABS 0.8 08/30/2019 0546   MONOABS 1.1 (H) 08/30/2019 0546   EOSABS 0.1 08/30/2019 0546   BASOSABS 0.0 08/30/2019 0546    Hgb A1C Lab Results  Component Value Date   HGBA1C 5.8 (A) 07/10/2019         Assessment & Plan:   [facility] Follow Up for Acute Blood Loss Anemia, Angiectasis, Colon Polyps, Iron Deficiency, B12 Deficiency, Demand Ischemia, Acute on Chronic CHF:  Hospital notes, labs and imaging reviewed He is to continue outpatient medications as prescribed Will check CBC with diff, CMET, IBC panel, ferritin, B12 today Has appt for another iron infusion tomorrow Follow up with cardiology and GI as previously scheduled  BPH:  Will trial Flomax, RX sent to pharmacy  Will follow up after labs, return precautions discussed Webb Silversmith, NP This visit occurred during the SARS-CoV-2 public health emergency.  Safety protocols were in place, including screening questions prior to the visit, additional usage of staff PPE, and extensive cleaning of exam room while observing appropriate contact time as indicated for disinfecting solutions.

## 2019-09-08 NOTE — Patient Instructions (Signed)
Anemia  Anemia is a condition in which you do not have enough red blood cells or hemoglobin. Hemoglobin is a substance in red blood cells that carries oxygen. When you do not have enough red blood cells or hemoglobin (are anemic), your body cannot get enough oxygen and your organs may not work properly. As a result, you may feel very tired or have other problems. What are the causes? Common causes of anemia include:  Excessive bleeding. Anemia can be caused by excessive bleeding inside or outside the body, including bleeding from the intestine or from periods in women.  Poor nutrition.  Long-lasting (chronic) kidney, thyroid, and liver disease.  Bone marrow disorders.  Cancer and treatments for cancer.  HIV (human immunodeficiency virus) and AIDS (acquired immunodeficiency syndrome).  Treatments for HIV and AIDS.  Spleen problems.  Blood disorders.  Infections, medicines, and autoimmune disorders that destroy red blood cells. What are the signs or symptoms? Symptoms of this condition include:  Minor weakness.  Dizziness.  Headache.  Feeling heartbeats that are irregular or faster than normal (palpitations).  Shortness of breath, especially with exercise.  Paleness.  Cold sensitivity.  Indigestion.  Nausea.  Difficulty sleeping.  Difficulty concentrating. Symptoms may occur suddenly or develop slowly. If your anemia is mild, you may not have symptoms. How is this diagnosed? This condition is diagnosed based on:  Blood tests.  Your medical history.  A physical exam.  Bone marrow biopsy. Your health care provider may also check your stool (feces) for blood and may do additional testing to look for the cause of your bleeding. You may also have other tests, including:  Imaging tests, such as a CT scan or MRI.  Endoscopy.  Colonoscopy. How is this treated? Treatment for this condition depends on the cause. If you continue to lose a lot of blood, you may  need to be treated at a hospital. Treatment may include:  Taking supplements of iron, vitamin S31, or folic acid.  Taking a hormone medicine (erythropoietin) that can help to stimulate red blood cell growth.  Having a blood transfusion. This may be needed if you lose a lot of blood.  Making changes to your diet.  Having surgery to remove your spleen. Follow these instructions at home:  Take over-the-counter and prescription medicines only as told by your health care provider.  Take supplements only as told by your health care provider.  Follow any diet instructions that you were given.  Keep all follow-up visits as told by your health care provider. This is important. Contact a health care provider if:  You develop new bleeding anywhere in the body. Get help right away if:  You are very weak.  You are short of breath.  You have pain in your abdomen or chest.  You are dizzy or feel faint.  You have trouble concentrating.  You have bloody or black, tarry stools.  You vomit repeatedly or you vomit up blood. Summary  Anemia is a condition in which you do not have enough red blood cells or enough of a substance in your red blood cells that carries oxygen (hemoglobin).  Symptoms may occur suddenly or develop slowly.  If your anemia is mild, you may not have symptoms.  This condition is diagnosed with blood tests as well as a medical history and physical exam. Other tests may be needed.  Treatment for this condition depends on the cause of the anemia. This information is not intended to replace advice given to you by  your health care provider. Make sure you discuss any questions you have with your health care provider. Document Released: 11/09/2004 Document Revised: 09/14/2017 Document Reviewed: 11/03/2016 Elsevier Patient Education  2020 [employer].

## 2019-09-08 NOTE — Progress Notes (Signed)
Virtual Visit via Video Note   This visit type was conducted due to national recommendations for restrictions regarding the COVID-19 Pandemic (e.g. social distancing) in an effort to limit this patient's exposure and mitigate transmission in our community.  Due to his co-morbid illnesses, this patient is at least at moderate risk for complications without adequate follow up.  This format is felt to be most appropriate for this patient at this time.  All issues noted in this document were discussed and addressed.  A limited physical exam was performed with this format.  Please refer to the patient's chart for his consent to telehealth for [facility].   Date:  09/16/2019   ID:  [place], DOB 1941-06-22, MRN [id number]  Patient Location: Home Provider Location: Home  PCP:  Jearld Fenton, NP  Cardiologist:  Lauree Chandler, MD   Electrophysiologist:  Thompson Grayer, MD   Evaluation Performed:  Follow-Up Visit  Chief Complaint: Post hospital follow-up  History of Present Illness:    Jake Gross is a 78 y.o. male with history of CAD status post CABG 11/2018 for severe left main disease, prior CVA in 2018, PAF on Eliquis, hypertension, hyperlipidemia, chronic diastolic CHF, DM, CKD stage III-IV.  I saw the patient in the office with severe dyspnea on exertion and fatigue.  He was admitted to the hospital with GI bleed and hemoglobin in the five range.  He was found to have two nonbleeding angiectasis in the duodenum treated with argon plasma coagulation felt to be the source of chronic blood loss and had three polyps removed on colonoscopy.  BNP was elevated and was treated with IV Lasix with slight bump in creatinine.  Troponins were elevated and felt to be secondary to demand ischemia in the setting of severe anemia.  2D echo showed normal LV systolic function with mild to moderate MR.  GI said was okay to restart Eliquis.  CHA2DS2-VASc equals 8.  Most recent labs  09/08/2019 hemoglobin 10.2 creatinine 2.93.  Patient says his breathing is much better but his legs began to swell.  He is still on Lasix 60 mg daily.  Following a low-sodium diet.  Dr. Loanne Drilling had tried to get a hold of them yesterday to draw labs.     The patient does not have symptoms concerning for COVID-19 infection (fever, chills, cough, or new shortness of breath).    Past Medical History:  Diagnosis Date  . 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 Arthritis   . Cervical spondylosis   . Diabetes mellitus   . Hyperlipidemia   . Hypertension   . Stroke ([place]) 06/19/2017  . Vitamin D deficiency    Past Surgical History:  Procedure Laterality Date  . ABCESS DRAINAGE Left    buttocks  . CARDIOVASCULAR STRESS TEST  10/12/1999   EF 63%. NO ISCHEMIA  . COLONOSCOPY W/ POLYPECTOMY     5 polyps  . COLONOSCOPY WITH PROPOFOL N/A 08/29/2019   Procedure: COLONOSCOPY WITH PROPOFOL;  Surgeon: Doran Stabler, MD;  Location: [place];  Service: Gastroenterology;  Laterality: N/A;  . CORONARY ARTERY BYPASS GRAFT N/A 11/26/2018   Procedure: CORONARY ARTERY BYPASS GRAFTING (CABG), ON PUMP, TIMES FOUR, USING LEFT INTERNAL MAMMARY ARTERY AND ENDOSCOPICALLY HARVESTED LEFT SAPHENOUS VEIN;  Surgeon: Gaye Pollack, MD;  Location: [place];  Service: Open Heart Surgery;  Laterality: N/A;  . ESOPHAGOGASTRODUODENOSCOPY (EGD) WITH PROPOFOL N/A 08/29/2019   Procedure: ESOPHAGOGASTRODUODENOSCOPY (  EGD) WITH PROPOFOL;  Surgeon: Doran Stabler, MD;  Location: [place];  Service: Gastroenterology;  Laterality: N/A;  . EUS N/A 03/11/2015   Procedure: LOWER ENDOSCOPIC ULTRASOUND (EUS);  Surgeon: Milus Banister, MD;  Location: [facility];  Service: Endoscopy;  Laterality: N/A;  . FLEXIBLE SIGMOIDOSCOPY N/A 02/02/2015   Procedure: FLEXIBLE SIGMOIDOSCOPY;  Surgeon: Inda Castle, MD;  Location: [facility];  Service: Endoscopy;  Laterality: N/A;   ERBE  . HEMOSTASIS CLIP PLACEMENT  08/29/2019   Procedure: HEMOSTASIS CLIP PLACEMENT;  Surgeon: Doran Stabler, MD;  Location: [place];  Service: Gastroenterology;;  . HOT HEMOSTASIS N/A 08/29/2019   Procedure: HOT HEMOSTASIS (ARGON PLASMA COAGULATION/BICAP);  Surgeon: Doran Stabler, MD;  Location: [place];  Service: Gastroenterology;  Laterality: N/A;  . LEFT HEART CATH AND CORONARY ANGIOGRAPHY N/A 11/20/2018   Procedure: LEFT HEART CATH AND CORONARY ANGIOGRAPHY;  Surgeon: Burnell Blanks, MD;  Location: [place] [facility];  Service: Cardiovascular;  Laterality: N/A;  . LOOP RECORDER INSERTION N/A 08/14/2017   Procedure: LOOP RECORDER INSERTION;  Surgeon: [place] Grayer, MD;  Location: [place] [facility];  Service: Cardiovascular;  Laterality: N/A;  . PARTIAL PROCTECTOMY BY TEM N/A 04/08/2015   Procedure: TEM PARTIAL PROCTECTOMY OF RECTAL MASS;  Surgeon: Michael Boston, MD;  Location: [facility];  Service: General;  Laterality: N/A;  . POLYPECTOMY  08/29/2019   Procedure: POLYPECTOMY;  Surgeon: Doran Stabler, MD;  Location: [facility];  Service: Gastroenterology;;  . TEE WITHOUT CARDIOVERSION N/A 06/25/2017   Procedure: TRANSESOPHAGEAL ECHOCARDIOGRAM (TEE);  Surgeon: Skeet Latch, MD;  Location: [place];  Service: Cardiovascular;  Laterality: N/A;  . TEE WITHOUT CARDIOVERSION N/A 11/26/2018   Procedure: TRANSESOPHAGEAL ECHOCARDIOGRAM (TEE);  Surgeon: Gaye Pollack, MD;  Location: [place];  Service: Open Heart Surgery;  Laterality: N/A;  . TONSILLECTOMY AND ADENOIDECTOMY     as child     Current Meds  Medication Sig  . amLODipine (NORVASC) 5 MG tablet Take 1 tablet (5 mg total) by mouth daily.  Marland Kitchen apixaban (ELIQUIS) 5 MG TABS tablet Take 1 tablet (5 mg total) by mouth 2 (two) times daily. This medication is on hold because of your recent bleeding. Do not take this medication until 09/05/19  . fenofibrate micronized (LOFIBRA) 67 MG capsule TAKE 1 CAPSULE DAILY  BEFORE BREAKFAST  . ferrous sulfate 325 (65 FE) MG tablet Take 1 tablet (325 mg total) by mouth daily.  . furosemide (LASIX) 40 MG tablet Take 60 mg by mouth daily. 1.5 tablet daily  . hydrALAZINE (APRESOLINE) 100 MG tablet Take 0.5 tablets (50 mg total) by mouth 3 (three) times daily.  . Insulin Glargine (LANTUS SOLOSTAR) 100 UNIT/ML Solostar Pen Inject 60 Units into the skin daily. And pen needles 1/day  . isosorbide mononitrate (IMDUR) 60 MG 24 hr tablet Take 1 tablet (60 mg total) by mouth daily.  . metoprolol tartrate (LOPRESSOR) 25 MG tablet Take 1.5 tablets (37.5 mg total) by mouth 2 (two) times daily.  . ONE TOUCH ULTRA TEST test strip USE 1 STRIP TWICE A DAY  . ONETOUCH DELICA LANCETS 86P MISC   . pantoprazole (PROTONIX) 40 MG tablet Take 1 tablet (40 mg total) by mouth daily before breakfast.  . rosuvastatin (CRESTOR) 20 MG tablet TAKE 1 TABLET BY MOUTH AT BEDTIME NEEDS APPT FOR REFILLS  . tamsulosin (FLOMAX) 0.4 MG CAPS capsule Take 1 capsule (0.4 mg total) by mouth daily.  . vitamin B-12 1000 MCG tablet  Take 1 tablet (1,000 mcg total) by mouth daily.     Allergies:   Patient has no known allergies.   Social History   Tobacco Use  . Smoking status: Former Smoker    Types: Cigarettes    Quit date: 08/13/1994    Years since quitting: 25.1  . Smokeless tobacco: Never Used  Substance Use Topics  . Alcohol use: No    Alcohol/week: 0.0 standard drinks  . Drug use: No     Family Hx: The patient's family history includes Cancer in his brother and father; Coronary artery disease in his brother; Diabetes in his brother and mother; Stroke in his mother. There is no history of Colon cancer, Esophageal cancer, Rectal cancer, or Stomach cancer.  ROS:   Please see the history of present illness.      All other systems reviewed and are negative.   Prior CV studies:   The following studies were reviewed today:  2D echo 11/13/2020IMPRESSIONS      1. Left ventricular ejection  fraction, by visual estimation, is 55 to 60%. The left ventricle has normal function. There is mildly increased left ventricular hypertrophy.  2. Abnormal septal motion consistent with post-operative status.  3. Elevated left atrial pressure.  4. Left ventricular diastolic parameters are consistent with Grade II diastolic dysfunction (pseudonormalization).  5. The left ventricle has no regional wall motion abnormalities.  6. Global right ventricle has mildly reduced systolic function.The right ventricular size is normal. No increase in right ventricular wall thickness.  7. Left atrial size was severely dilated.  8. Right atrial size was normal.  9. The mitral valve is normal in structure. Mild to moderate mitral valve regurgitation. 10. The tricuspid valve is normal in structure. Tricuspid valve regurgitation is not demonstrated. 11. The aortic valve is normal in structure. Aortic valve regurgitation is not visualized. 12. The pulmonic valve was grossly normal. Pulmonic valve regurgitation is not visualized. 13. TR signal is inadequate for assessing pulmonary artery systolic pressure.   Left Heart Catheterization 11/20/2018:  Prox RCA to Mid RCA lesion is 30% stenosed.  RPDA lesion is 99% stenosed.  Post Atrio lesion is 100% stenosed.  Ost 2nd Diag to 2nd Diag lesion is 99% stenosed.  Prox LAD lesion is 95% stenosed.  Ost 1st Diag lesion is 80% stenosed.  Ost Cx to Prox Cx lesion is 70% stenosed.  Ost LM lesion is 70% stenosed.   1. Moderately severe ostial left main stenosis.  2. Severe stenosis in the heavily calcified mid LAD 3. Moderately severe proximal Circumflex stenosis 4. The RCA has mild calcific disease through the proximal and mid segments. The PDA is a moderate caliber vessel with severe stenosis. 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  5. Elevated LVEDP   Recommendations: He has severe left main and three vessel  CAD. I will ask CT surgery to see him to discuss CABG. Given chronic kidney disease, I will admit him for hydration. Will hold Plavix. He will need Plavix washout prior to surgery. Will arrange echo tomorrow to assess LVEF. Will start ASA. Continue beta blocker and statin. Given elevated filling pressures, one dose of IV Lasix tonight.    Echocardiogram 11/21/2018: Impressions: 1. The left ventricle has normal systolic function of 02-40%. The cavity size was mildly increased. There is no increased left ventricular wall thickness. Echo evidence of impaired diastolic relaxation.  2. The right ventricle has normal systolic function. The cavity was normal. There is  no increase in right ventricular wall thickness.  3. Right atrial size was mildly dilated.  4. The mitral valve is normal in structure. There is mild to moderate mitral annular calcification present. No evidence of mitral valve stenosis. No mitral regurgitation.  5. The tricuspid valve is normal in structure.  6. The aortic valve is tricuspid There is mild calcification of the aortic valve. No aortic stenosis.  7. There is mild dilatation of the aortic root and ascending aorta.  8. No evidence of left ventricular regional wall motion abnormalities.  9. No complete TR doppler jet so unable to estimate PA systolic pressure.     Labs/Other Tests and Data Reviewed:    EKG:  No ECG reviewed.  Recent Labs: 11/27/2018: Magnesium 2.8 06/09/2019: NT-Pro BNP 775 08/27/2019: B Natriuretic Peptide 502.5; TSH 1.445 09/08/2019: ALT 7; BUN 50; Creatinine, Ser 2.93; Hemoglobin 10.2; Platelets 343.0; Sodium 148 09/15/2019: Potassium 4.9   Recent Lipid Panel Lab Results  Component Value Date/Time   CHOL 138 11/12/2018 03:28 PM   TRIG 214.0 (H) 11/12/2018 03:28 PM   HDL 25.60 (L) 11/12/2018 03:28 PM   CHOLHDL 5 11/12/2018 03:28 PM   LDLCALC 56 06/22/2017 05:16 AM   LDLDIRECT 80.0 11/12/2018 03:28 PM    Wt Readings from Last 3 Encounters:   09/16/19 201 lb (91.2 kg)  09/09/19 201 lb (91.2 kg)  09/08/19 201 lb (91.2 kg)     Objective:    Vital Signs:  BP (!) 149/71   Pulse 73   Ht 5\' 7"  (1.702 m)   Wt 201 lb (91.2 kg)   BMI 31.48 kg/m    VITAL SIGNS:  reviewed GEN:  no acute distress RESPIRATORY:  normal respiratory effort, symmetric expansion CARDIOVASCULAR:  lower extremity edema noted  ASSESSMENT & PLAN:    1. Chronic diastolic CHF 2D echo 56/21/3086 normal LVEF with mild to moderate MR-has leg swelling today.  On Lasix 60 mg daily.  Potassium stopped because of elevated creatinine of 2.93 on 09/08/2019.  Will have home health reassess heart failure and draw labs. 2. GI bleed 08/2019 treated with three transfusions GI okayed restarting Eliquis.  See above for details. 3. PAF on Eliquis CHA2DS2-VASc equals 8-may need to decrease dose if Crt remains elevated. 4. Essential hypertension BP stable 5. CAD status post CABG 11/2018 for left main disease.  Elevated troponins in the hospital felt to be demand ischemia in the setting of acute anemia and CHF.  2D echo with normal LVEF 6. CKD stage III-IV discharge creatinine 1.97 08/30/2019, 2.93 09/08/19-recheck this week. 7. History of cryptogenic stroke 2018 8. Hyperlipidemia LDL 81/28/20  COVID-19 Education: The signs and symptoms of COVID-19 were discussed with the patient and how to seek care for testing (follow up with PCP or arrange E-visit).   The importance of social distancing was discussed today.  Time:   Today, I have spent 10:20 minutes with the patient with telehealth technology discussing the above problems.     Medication Adjustments/Labs and Tests Ordered: Current medicines are reviewed at length with the patient today.  Concerns regarding medicines are outlined above.   Tests Ordered: No orders of the defined types were placed in this encounter.   Medication Changes: No orders of the defined types were placed in this encounter.   Follow Up:   Either In Person or Virtual in 2 week(s) with Jake Barrios PA-C or Dr. Angelena Form  Signed, Jake Barrios, PA-C  09/16/2019 10:39 AM    [facility]

## 2019-09-09 ENCOUNTER — Ambulatory Visit (HOSPITAL_COMMUNITY)
Admission: RE | Admit: 2019-09-09 | Discharge: 2019-09-09 | Disposition: A | Discharge status: Home / Self Care | Payer: Medicare Other | Source: Ambulatory Visit | Attending: Gastroenterology | Admitting: Gastroenterology

## 2019-09-09 DIAGNOSIS — D508 Other iron deficiency anemias: Secondary | ICD-10-CM | POA: Insufficient documentation

## 2019-09-09 MED ORDER — SODIUM CHLORIDE 0.9 % IV SOLN
510.0000 mg | Freq: Once | INTRAVENOUS | Status: AC
  Administered 2019-09-09: 510 mg via INTRAVENOUS
  Filled 2019-09-09: qty 510

## 2019-09-09 NOTE — Discharge Instructions (Signed)

## 2019-09-10 NOTE — Addendum Note (Signed)
Addended by: Lurlean Nanny on: 09/10/2019 05:34 PM   Modules accepted: Orders

## 2019-09-15 ENCOUNTER — Other Ambulatory Visit (INDEPENDENT_AMBULATORY_CARE_PROVIDER_SITE_OTHER): Payer: Medicare Other

## 2019-09-15 ENCOUNTER — Other Ambulatory Visit: Payer: Self-pay

## 2019-09-15 ENCOUNTER — Telehealth: Payer: Self-pay | Admitting: Endocrinology

## 2019-09-15 ENCOUNTER — Ambulatory Visit (INDEPENDENT_AMBULATORY_CARE_PROVIDER_SITE_OTHER): Payer: Medicare Other | Admitting: Endocrinology

## 2019-09-15 DIAGNOSIS — E119 Type 2 diabetes mellitus without complications: Secondary | ICD-10-CM | POA: Diagnosis not present

## 2019-09-15 DIAGNOSIS — Z794 Long term (current) use of insulin: Secondary | ICD-10-CM

## 2019-09-15 DIAGNOSIS — E875 Hyperkalemia: Secondary | ICD-10-CM

## 2019-09-15 DIAGNOSIS — I248 Other forms of acute ischemic heart disease: Secondary | ICD-10-CM | POA: Diagnosis not present

## 2019-09-15 NOTE — Patient Instructions (Addendum)
Blood tests are requested for you today.  Please come in to have drawn.  We'll let you know about the results.  check your blood sugar twice a day.  vary the time of day when you check, between before the 3 meals, and at bedtime.  also check if you have symptoms of your blood sugar being too high or too low.  please keep a record of the readings and bring it to your next appointment here (or you can bring the meter itself).  You can write it on any piece of paper.  please call us sooner if your blood sugar goes below 70, or if you have a lot of readings over 200. Please have a follow-up appointment in 3 months.

## 2019-09-15 NOTE — Telephone Encounter (Signed)
please contact patient, to sched labs soon.

## 2019-09-15 NOTE — Telephone Encounter (Signed)
Lft vm for pt to call and schedule labs

## 2019-09-15 NOTE — Addendum Note (Signed)
Addended by: Ellamae Sia on: 09/15/2019 10:42 AM   Modules accepted: Orders

## 2019-09-15 NOTE — Progress Notes (Signed)
Subjective:    Patient ID: Jake Gross, male    DOB: 27-Dec-1940, 78 y.o.   MRN: [id number]  HPI telehealth visit today via doxy video visit.  Alternatives to telehealth are presented to this patient, and the patient agrees to the telehealth visit. Pt is advised of the cost of the visit, and agrees to this, also.   Patient is at home, and I am at the office.   Persons attending the telehealth visit: the patient and I Pt returns for f/u of diabetes mellitus:  DM type: Insulin-requiring type 2 DM.   Dx'ed: 3329 Complications: polyneuropathy, retinopathy, CVA, PAD, CAD, and renal failure.  Therapy: insulin since 2005 DKA: never Severe hypoglycemia: last episode was in 2014.   Pancreatitis: never.  Pancreatic imaging: normal CT in 2006 Other: he eats 2 meals per day; he takes QD insulin, after poor results with multiple daily injections.   Interval history: He takes lantus as rx'ed.  He says cbg varies from 90-172.  He checks fasting only.  pt states he feels well in general.  Past Medical History:  Diagnosis Date  . 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 Arthritis   . Cervical spondylosis   . Diabetes mellitus   . Hyperlipidemia   . Hypertension   . Stroke ([place]) 06/19/2017  . Vitamin D deficiency     Past Surgical History:  Procedure Laterality Date  . ABCESS DRAINAGE Left    buttocks  . CARDIOVASCULAR STRESS TEST  10/12/1999   EF 63%. NO ISCHEMIA  . COLONOSCOPY W/ POLYPECTOMY     5 polyps  . COLONOSCOPY WITH PROPOFOL N/A 08/29/2019   Procedure: COLONOSCOPY WITH PROPOFOL;  Surgeon: Doran Stabler, MD;  Location: [place];  Service: Gastroenterology;  Laterality: N/A;  . CORONARY ARTERY BYPASS GRAFT N/A 11/26/2018   Procedure: CORONARY ARTERY BYPASS GRAFTING (CABG), ON PUMP, TIMES FOUR, USING LEFT INTERNAL MAMMARY ARTERY AND ENDOSCOPICALLY HARVESTED LEFT SAPHENOUS VEIN;  Surgeon: Gaye Pollack, MD;  Location:  [place];  Service: Open Heart Surgery;  Laterality: N/A;  . ESOPHAGOGASTRODUODENOSCOPY (EGD) WITH PROPOFOL N/A 08/29/2019   Procedure: ESOPHAGOGASTRODUODENOSCOPY (EGD) WITH PROPOFOL;  Surgeon: Doran Stabler, MD;  Location: [place];  Service: Gastroenterology;  Laterality: N/A;  . EUS N/A 03/11/2015   Procedure: LOWER ENDOSCOPIC ULTRASOUND (EUS);  Surgeon: Milus Banister, MD;  Location: [facility];  Service: Endoscopy;  Laterality: N/A;  . FLEXIBLE SIGMOIDOSCOPY N/A 02/02/2015   Procedure: FLEXIBLE SIGMOIDOSCOPY;  Surgeon: Inda Castle, MD;  [facility];  Service: Endoscopy;  Laterality: N/A;  ERBE  . HEMOSTASIS CLIP PLACEMENT  08/29/2019   Procedure: HEMOSTASIS CLIP PLACEMENT;  Surgeon: Doran Stabler, MD;  Location: [place];  Service: Gastroenterology;;  . HOT HEMOSTASIS N/A 08/29/2019   Procedure: HOT HEMOSTASIS (ARGON PLASMA COAGULATION/BICAP);  Surgeon: Doran Stabler, MD;  Location: [place];  Service: Gastroenterology;  Laterality: N/A;  . LEFT HEART CATH AND CORONARY ANGIOGRAPHY N/A 11/20/2018   Procedure: LEFT HEART CATH AND CORONARY ANGIOGRAPHY;  Surgeon: Burnell Blanks, MD;  Location: [place] [facility];  Service: Cardiovascular;  Laterality: N/A;  . LOOP RECORDER INSERTION N/A 08/14/2017   Procedure: LOOP RECORDER INSERTION;  Surgeon: Thompson Grayer, MD;  Location: [place] [facility];  Service: Cardiovascular;  Laterality: N/A;  . PARTIAL PROCTECTOMY BY TEM N/A 04/08/2015   Procedure: TEM PARTIAL PROCTECTOMY OF RECTAL MASS;  Surgeon: Michael Boston, MD;  [facility];  Service: General;  Laterality: N/A;  . POLYPECTOMY  08/29/2019   Procedure: POLYPECTOMY;  Surgeon: Doran Stabler, MD;  Location: [place];  Service: Gastroenterology;;  . TEE WITHOUT CARDIOVERSION N/A 06/25/2017   Procedure: TRANSESOPHAGEAL ECHOCARDIOGRAM (TEE);  Surgeon: Skeet Latch, MD;  Location: [place];  Service: Cardiovascular;  Laterality: N/A;  .  TEE WITHOUT CARDIOVERSION N/A 11/26/2018   Procedure: TRANSESOPHAGEAL ECHOCARDIOGRAM (TEE);  Surgeon: Gaye Pollack, MD;  Location: [place];  Service: Open Heart Surgery;  Laterality: N/A;  . TONSILLECTOMY AND ADENOIDECTOMY     as child    Social History   Socioeconomic History  . Marital status: Divorced    Spouse name: Not on file  . Number of children: 2  . Years of education: Not on file  . Highest education level: Not on file  Occupational History  . Occupation: retired    Fish farm manager: [employer]  Social Needs  . Financial resource strain: Not on file  . Food insecurity    Worry: Not on file    Inability: Not on file  . Transportation needs    Medical: Not on file    Non-medical: Not on file  Tobacco Use  . Smoking status: Former Smoker    Types: Cigarettes    Quit date: 08/13/1994    Years since quitting: 25.1  . Smokeless tobacco: Never Used  Substance and Sexual Activity  . Alcohol use: No    Alcohol/week: 0.0 standard drinks  . Drug use: No  . Sexual activity: Not Currently    Birth control/protection: None  Lifestyle  . Physical activity    Days per week: Not on file    Minutes per session: Not on file  . Stress: Not on file  Relationships  . Social Herbalist on phone: Not on file    Gets together: Not on file    Attends religious service: Not on file    Active member of club or organization: Not on file    Attends meetings of clubs or organizations: Not on file    Relationship status: Not on file  . Intimate partner violence    Fear of current or ex partner: Not on file    Emotionally abused: Not on file    Physically abused: Not on file    Forced sexual activity: Not on file  Other Topics Concern  . Not on file  Social History Narrative  . Not on file    Current Outpatient Medications on File Prior to Visit  Medication Sig Dispense Refill  . amLODipine (NORVASC) 5 MG tablet Take 1 tablet (5 mg total) by mouth daily. 90 tablet 3  . apixaban  (ELIQUIS) 5 MG TABS tablet Take 1 tablet (5 mg total) by mouth 2 (two) times daily. This medication is on hold because of your recent bleeding. Do not take this medication until 09/05/19 60 tablet 6  . fenofibrate micronized (LOFIBRA) 67 MG capsule TAKE 1 CAPSULE DAILY BEFORE BREAKFAST 90 capsule 0  . ferrous sulfate 325 (65 FE) MG tablet Take 1 tablet (325 mg total) by mouth daily. 30 tablet 0  . furosemide (LASIX) 40 MG tablet Take 60 mg by mouth daily. 1.5 tablet daily    . hydrALAZINE (APRESOLINE) 100 MG tablet Take 0.5 tablets (50 mg total) by mouth 3 (three) times daily. 270 tablet 3  . Insulin Glargine (LANTUS SOLOSTAR) 100 UNIT/ML Solostar Pen Inject 60 Units into the skin daily. And pen  needles 1/day 30 mL 11  . isosorbide mononitrate (IMDUR) 60 MG 24 hr tablet Take 1 tablet (60 mg total) by mouth daily. 90 tablet 3  . metoprolol tartrate (LOPRESSOR) 25 MG tablet Take 1.5 tablets (37.5 mg total) by mouth 2 (two) times daily. 270 tablet 3  . ONE TOUCH ULTRA TEST test strip USE 1 STRIP TWICE A DAY 200 each 3  . ONETOUCH DELICA LANCETS 37T MISC     . pantoprazole (PROTONIX) 40 MG tablet Take 1 tablet (40 mg total) by mouth daily before breakfast. 30 tablet 0  . rosuvastatin (CRESTOR) 20 MG tablet TAKE 1 TABLET BY MOUTH AT BEDTIME NEEDS APPT FOR REFILLS 90 tablet 0  . tamsulosin (FLOMAX) 0.4 MG CAPS capsule Take 1 capsule (0.4 mg total) by mouth daily. 30 capsule 3  . vitamin B-12 1000 MCG tablet Take 1 tablet (1,000 mcg total) by mouth daily. 30 tablet 0   No current facility-administered medications on file prior to visit.     No Known Allergies  Family History  Problem Relation Age of Onset  . Cancer Father        "all over"  . Coronary artery disease Brother   . Diabetes Brother   . Stroke Mother   . Diabetes Mother   . Cancer Brother   . Colon cancer Neg Hx   . Esophageal cancer Neg Hx   . Rectal cancer Neg Hx   . Stomach cancer Neg Hx     There were no vitals taken for  this visit.  Review of Systems He denies hypoglycemia.      Objective:   Physical Exam VITAL SIGNS:  See vs page GENERAL: no distress Pulses: dorsalis pedis intact bilat.   MSK: no deformity of the feet CV: no leg edema Skin:  no ulcer on the feet.  normal color and temp on the feet. Neuro: sensation is intact to touch on the feet.     Lab Results  Component Value Date   CREATININE 2.93 (H) 09/08/2019   BUN 50 (H) 09/08/2019   NA 148 (H) 09/08/2019   K 5.5 No hemolysis seen (H) 09/08/2019   CL 115 (H) 09/08/2019   CO2 21 09/08/2019       Assessment & Plan:  Insulin-requiring type 2 DM, with PAD Renal failure: check fructosamine to compare to A1c.   Patient Instructions  Blood tests are requested for you today.  Please come in to have drawn.  We'll let you know about the results.  check your blood sugar twice a day.  vary the time of day when you check, between before the 3 meals, and at bedtime.  also check if you have symptoms of your blood sugar being too high or too low.  please keep a record of the readings and bring it to your next appointment here (or you can bring the meter itself).  You can write it on any piece of paper.  please call us sooner if your blood sugar goes below 70, or if you have a lot of readings over 200. Please have a follow-up appointment in 3 months.

## 2019-09-15 NOTE — Addendum Note (Signed)
Addended by: Jearld Fenton on: 09/15/2019 08:30 AM   Modules accepted: Orders

## 2019-09-16 ENCOUNTER — Other Ambulatory Visit: Payer: Self-pay

## 2019-09-16 ENCOUNTER — Telehealth (INDEPENDENT_AMBULATORY_CARE_PROVIDER_SITE_OTHER): Payer: Medicare Other | Admitting: Physician Assistant

## 2019-09-16 ENCOUNTER — Encounter: Payer: Self-pay | Admitting: Physician Assistant

## 2019-09-16 ENCOUNTER — Telehealth: Payer: Self-pay

## 2019-09-16 VITALS — BP 149/71 | HR 73 | Ht 67.0 in | Wt 201.0 lb

## 2019-09-16 DIAGNOSIS — I129 Hypertensive chronic kidney disease with stage 1 through stage 4 chronic kidney disease, or unspecified chronic kidney disease: Secondary | ICD-10-CM | POA: Diagnosis not present

## 2019-09-16 DIAGNOSIS — Z87891 Personal history of nicotine dependence: Secondary | ICD-10-CM

## 2019-09-16 DIAGNOSIS — K5521 Angiodysplasia of colon with hemorrhage: Secondary | ICD-10-CM | POA: Diagnosis not present

## 2019-09-16 DIAGNOSIS — Z951 Presence of aortocoronary bypass graft: Secondary | ICD-10-CM | POA: Diagnosis not present

## 2019-09-16 DIAGNOSIS — I248 Other forms of acute ischemic heart disease: Secondary | ICD-10-CM

## 2019-09-16 DIAGNOSIS — Z8673 Personal history of transient ischemic attack (TIA), and cerebral infarction without residual deficits: Secondary | ICD-10-CM

## 2019-09-16 DIAGNOSIS — E782 Mixed hyperlipidemia: Secondary | ICD-10-CM | POA: Diagnosis not present

## 2019-09-16 DIAGNOSIS — I10 Essential (primary) hypertension: Secondary | ICD-10-CM

## 2019-09-16 DIAGNOSIS — I480 Paroxysmal atrial fibrillation: Secondary | ICD-10-CM

## 2019-09-16 DIAGNOSIS — N183 Chronic kidney disease, stage 3 unspecified: Secondary | ICD-10-CM

## 2019-09-16 DIAGNOSIS — I5033 Acute on chronic diastolic (congestive) heart failure: Secondary | ICD-10-CM

## 2019-09-16 LAB — POTASSIUM: Potassium: 4.9 mmol/L (ref 3.5–5.3)

## 2019-09-16 NOTE — Patient Instructions (Signed)
Medication Instructions:  Your physician recommends that you continue on your current medications as directed. Please refer to the Current Medication list given to you today.  If you need a refill on your cardiac medications before your next appointment, please call your pharmacy.   Lab work: We will arrange for Home Health to come out and draw labs (BMET, BNP)  If you have labs (blood work) drawn today and your tests are completely normal, you will receive your results only by: Marland Kitchen MyChart Message (if you have MyChart) OR . A paper copy in the mail If you have any lab test that is abnormal or we need to change your treatment, we will call you to review the results.  Testing/Procedures: None ordered  Follow-Up: . Keep appointment with Ermalinda Barrios, PA in the office on 09/30/19 at 11:00 AM  Any Other Special Instructions Will Be Listed Below (If Applicable).

## 2019-09-16 NOTE — Telephone Encounter (Signed)
[facility] Visit Follow Up Request   Date of Request ([place]):  September 16, 2019  Requesting Provider:  Ermalinda Barrios, PA    Agency Requested:    [facility] Contact:  Glory Buff, NP [address] [place], [place] [address] Phone #:  [phone] Fax #:  [phone]  Patient Demographic Information: Name:  Jake Gross Age:  78 y.o.   DOB:  08/05/1941  MRN:  [id number]   Home visit progress note(s), lab results, telemetry strips, etc were reviewed.  Provider Recommendations: Please draw BMET and BNP this week. If possible, please draw A1C and Fructosamine ordered by Dr. Loanne Drilling.  Follow up home services requested:  Labs:  BMET and BNP

## 2019-09-19 DIAGNOSIS — N183 Chronic kidney disease, stage 3 unspecified: Secondary | ICD-10-CM | POA: Diagnosis not present

## 2019-09-19 DIAGNOSIS — I5033 Acute on chronic diastolic (congestive) heart failure: Secondary | ICD-10-CM | POA: Diagnosis not present

## 2019-09-19 NOTE — Progress Notes (Signed)
Home Visit on behalf of [facility] to draw labs.  [name] came to the door and stated has doing ok overall.  He ambulated slowly and carefully, but unassisted.  [name] stated he had just had surgery a few weeks ago and feels all he does is go to the doctor, but is grateful to be alive and that he felt he wasn't going to make it.  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  Denies shob, however.  Reported he lives alone but his son lives a few miles away, visits almost daily, and helps him out a lot.   Reports the following from this am: 149/71, 72, CBG 106, wt 201lbs. Mr. Hallum  BMET, BNP, A1c, and Fructosamine was drawn and brought to the [facility] on [address].

## 2019-09-20 LAB — BASIC METABOLIC PANEL
BUN/Creatinine Ratio: 10 (ref 10–24)
BUN: 21 mg/dL (ref 8–27)
CO2: 20 mmol/L (ref 20–29)
Calcium: 8.8 mg/dL (ref 8.6–10.2)
Chloride: 117 mmol/L (ref 96–106)
Creatinine, Ser: 2.14 mg/dL — ABNORMAL HIGH (ref 0.76–1.27)
GFR calc Af Amer: 34 mL/min/{1.73_m2} — ABNORMAL LOW (ref >59)
GFR calc non Af Amer: 29 mL/min/{1.73_m2} — ABNORMAL LOW (ref >59)
Glucose: 123 mg/dL — ABNORMAL HIGH (ref 65–99)
Potassium: 4.9 mmol/L (ref 3.5–5.2)
Sodium: 147 mmol/L — ABNORMAL HIGH (ref 134–144)

## 2019-09-20 LAB — PRO B NATRIURETIC PEPTIDE: NT-Pro BNP: 1867 pg/mL — ABNORMAL HIGH (ref 0–486)

## 2019-09-23 ENCOUNTER — Telehealth: Payer: Self-pay

## 2019-09-23 ENCOUNTER — Other Ambulatory Visit: Payer: Self-pay

## 2019-09-23 DIAGNOSIS — K5521 Angiodysplasia of colon with hemorrhage: Secondary | ICD-10-CM

## 2019-09-23 DIAGNOSIS — N183 Chronic kidney disease, stage 3 unspecified: Secondary | ICD-10-CM

## 2019-09-23 DIAGNOSIS — I5033 Acute on chronic diastolic (congestive) heart failure: Secondary | ICD-10-CM

## 2019-09-23 NOTE — Telephone Encounter (Signed)
[facility] Visit Follow Up Request   Date of Request ([place]):  September 23, 2019  Requesting Provider:  Ermalinda Barrios, PA    Agency Requested:    [facility] Contact:  Glory Buff, NP [address] [place], [place] [address] Phone #:  [phone] Fax #:  [phone]  Patient Demographic Information: Name:  Jake Gross Age:  78 y.o.   DOB:  [birth year]  MRN:  [id number]   Home visit progress note(s), lab results, telemetry strips, etc were reviewed.  Provider Recommendations: Patient to increase lasix to 80 mg BID x 2 days and recheck BMET and BNP at the end of the week, as well as CBC.  Follow up home services requested:  Labs:  BMET, BNP, CBC on Thursday or Friday

## 2019-09-26 DIAGNOSIS — N183 Chronic kidney disease, stage 3 unspecified: Secondary | ICD-10-CM | POA: Diagnosis not present

## 2019-09-26 DIAGNOSIS — I5033 Acute on chronic diastolic (congestive) heart failure: Secondary | ICD-10-CM | POA: Diagnosis not present

## 2019-09-26 DIAGNOSIS — K5521 Angiodysplasia of colon with hemorrhage: Secondary | ICD-10-CM | POA: Diagnosis not present

## 2019-09-27 LAB — CBC
Hematocrit: 33.3 % — ABNORMAL LOW (ref 37.5–51.0)
Hemoglobin: 10 g/dL — ABNORMAL LOW (ref 13.0–17.7)
MCH: 24.6 pg — ABNORMAL LOW (ref 26.6–33.0)
MCHC: 30 g/dL — ABNORMAL LOW (ref 31.5–35.7)
MCV: 82 fL (ref 79–97)
Platelets: 229 10*3/uL (ref 150–450)
RBC: 4.06 x10E6/uL — ABNORMAL LOW (ref 4.14–5.80)
RDW: 23.2 % — ABNORMAL HIGH (ref 11.6–15.4)
WBC: 6.9 10*3/uL (ref 3.4–10.8)

## 2019-09-27 LAB — BASIC METABOLIC PANEL
BUN/Creatinine Ratio: 13 (ref 10–24)
BUN: 26 mg/dL (ref 8–27)
CO2: 20 mmol/L (ref 20–29)
Calcium: 8.7 mg/dL (ref 8.6–10.2)
Chloride: 112 mmol/L — ABNORMAL HIGH (ref 96–106)
Creatinine, Ser: 1.94 mg/dL — ABNORMAL HIGH (ref 0.76–1.27)
GFR calc Af Amer: 38 mL/min/{1.73_m2} — ABNORMAL LOW (ref >59)
GFR calc non Af Amer: 33 mL/min/{1.73_m2} — ABNORMAL LOW (ref >59)
Glucose: 110 mg/dL — ABNORMAL HIGH (ref 65–99)
Potassium: 5.7 mmol/L — ABNORMAL HIGH (ref 3.5–5.2)
Sodium: 142 mmol/L (ref 134–144)

## 2019-09-27 LAB — PRO B NATRIURETIC PEPTIDE: NT-Pro BNP: 1936 pg/mL — ABNORMAL HIGH (ref 0–486)

## 2019-09-28 NOTE — Progress Notes (Signed)
Home Visit on behalf of [facility] on 12/11 to draw labs.  [name] c/o tightness/swelling to BLE's that he's had since d/c from hosptial but no worsening shob.  States he has a f/u appt w/MD on Tues 12/15. BMET, ProBNP, CBC drawn and brought to [facility] on [address]

## 2019-09-29 DIAGNOSIS — I2510 Atherosclerotic heart disease of native coronary artery without angina pectoris: Secondary | ICD-10-CM | POA: Insufficient documentation

## 2019-09-29 DIAGNOSIS — Z8719 Personal history of other diseases of the digestive system: Secondary | ICD-10-CM

## 2019-09-29 HISTORY — DX: Personal history of other diseases of the digestive system: Z87.19

## 2019-09-29 HISTORY — DX: Atherosclerotic heart disease of native coronary artery without angina pectoris: I25.10

## 2019-09-29 NOTE — Progress Notes (Signed)
Cardiology Office Note    Date:  09/30/2019   ID:  [employer], DOB [birth year], MRN [id number]  PCP:  Jearld Fenton, NP  Cardiologist: Lauree Chandler, MD EPS: Thompson Grayer, MD  No chief complaint on file.   History of Present Illness:  Jake Gross is a 78 y.o. male with history of CAD status post CABG 11/2018 for severe left main disease, prior CVA in 2018, PAF on Eliquis, hypertension, hyperlipidemia, chronic diastolic CHF, DM, CKD stage III-IV.    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  GI said it was okay to restart Eliquis.  CHA2DS2-VASc equals 8 2D echo showed normal LV function with mild to moderate MR.   I had a telemedicine visit with the patient 09/16/2019 and said his breathing was much better but his legs started to swell again.  He was on Lasix 60 mg daily and following a low-salt diet.  I had home health evaluate the patient will check labs.  Labs 09/19/2019 BNP 1867 creatinine 2.14 and chloride 117 which was a critical level.  Patient is not having any symptoms other than edema.  He is scheduled to see renal.  I have him take Lasix 80 mg twice daily for 3 days.  Repeat BNP 09/26/2019 1936 creatinine 1.94 potassium 5.7 and chloride 112.  Patient comes in complaining of worsening edema. Has urinated 43 times since 4 am.sometimes goes 84 times in 1 day. Taking second dose Lasix at 7:30 pm. Says he's putting a lot of urine out each time. Has gotten some extra salt with canned soup had ham/turkey/dressing.    Past Medical History:  Diagnosis Date  . 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 Arthritis   . Cervical spondylosis   . Diabetes mellitus   . Hyperlipidemia   . Hypertension   . Stroke ([place]) 06/19/2017  . Vitamin D  deficiency     Past Surgical History:  Procedure Laterality Date  . ABCESS DRAINAGE Left    buttocks  . CARDIOVASCULAR STRESS TEST  10/12/1999   EF 63%. NO ISCHEMIA  . COLONOSCOPY W/ POLYPECTOMY     5 polyps  . COLONOSCOPY WITH PROPOFOL N/A 08/29/2019   Procedure: COLONOSCOPY WITH PROPOFOL;  Surgeon: Doran Stabler, MD;  Location: [place];  Service: Gastroenterology;  Laterality: N/A;  . CORONARY ARTERY BYPASS GRAFT N/A 11/26/2018   Procedure: CORONARY ARTERY BYPASS GRAFTING (CABG), ON PUMP, TIMES FOUR, USING LEFT INTERNAL MAMMARY ARTERY AND ENDOSCOPICALLY HARVESTED LEFT SAPHENOUS VEIN;  Surgeon: Gaye Pollack, MD;  Location: [place];  Service: Open Heart Surgery;  Laterality: N/A;  . ESOPHAGOGASTRODUODENOSCOPY (EGD) WITH PROPOFOL N/A 08/29/2019   Procedure: ESOPHAGOGASTRODUODENOSCOPY (EGD) WITH PROPOFOL;  Surgeon: Doran Stabler, MD;  Location: [place];  Service: Gastroenterology;  Laterality: N/A;  . EUS N/A 03/11/2015   Procedure: LOWER ENDOSCOPIC ULTRASOUND (EUS);  Surgeon: Milus Banister, MD;  Location: [facility];  Service: Endoscopy;  Laterality: N/A;  . FLEXIBLE SIGMOIDOSCOPY N/A 02/02/2015   Procedure: FLEXIBLE SIGMOIDOSCOPY;  Surgeon: Inda Castle, MD;  Location: [facility];  Service: Endoscopy;  Laterality: N/A;  ERBE  . HEMOSTASIS CLIP PLACEMENT  08/29/2019   Procedure: HEMOSTASIS CLIP PLACEMENT;  Surgeon: Doran Stabler, MD;  Location: [facility];  Service: Gastroenterology;;  . HOT HEMOSTASIS N/A 08/29/2019   Procedure: HOT HEMOSTASIS (ARGON PLASMA COAGULATION/BICAP);  Surgeon: Doran Stabler, MD;  Location: [place];  Service: Gastroenterology;  Laterality: N/A;  . LEFT HEART CATH AND CORONARY ANGIOGRAPHY N/A 11/20/2018   Procedure: LEFT HEART CATH AND CORONARY ANGIOGRAPHY;  Surgeon: Burnell Blanks, MD;  Location: [place] [facility];  Service: Cardiovascular;  Laterality: N/A;  . LOOP RECORDER INSERTION N/A 08/14/2017   Procedure:  LOOP RECORDER INSERTION;  Surgeon: Thompson Grayer, MD;  Location: [place] [facility];  Service: Cardiovascular;  Laterality: N/A;  . PARTIAL PROCTECTOMY BY TEM N/A 04/08/2015   Procedure: TEM PARTIAL PROCTECTOMY OF RECTAL MASS;  Surgeon: Michael Boston, MD;  Location: [facility];  Service: General;  Laterality: N/A;  . POLYPECTOMY  08/29/2019   Procedure: POLYPECTOMY;  Surgeon: Doran Stabler, MD;  Location: [facility] ENDOSCOPY;  Service: Gastroenterology;;  . TEE WITHOUT CARDIOVERSION N/A 06/25/2017   Procedure: TRANSESOPHAGEAL ECHOCARDIOGRAM (TEE);  Surgeon: Skeet Latch, MD;  Location: [place];  Service: Cardiovascular;  Laterality: N/A;  . TEE WITHOUT CARDIOVERSION N/A 11/26/2018   Procedure: TRANSESOPHAGEAL ECHOCARDIOGRAM (TEE);  Surgeon: Gaye Pollack, MD;  Location: [place];  Service: Open Heart Surgery;  Laterality: N/A;  . TONSILLECTOMY AND ADENOIDECTOMY     as child    Current Medications: Current Meds  Medication Sig  . amLODipine (NORVASC) 5 MG tablet Take 1 tablet (5 mg total) by mouth daily.  Marland Kitchen apixaban (ELIQUIS) 5 MG TABS tablet Take 1 tablet (5 mg total) by mouth 2 (two) times daily. This medication is on hold because of your recent bleeding. Do not take this medication until 09/05/19  . fenofibrate micronized (LOFIBRA) 67 MG capsule TAKE 1 CAPSULE DAILY BEFORE BREAKFAST  . ferrous sulfate 325 (65 FE) MG tablet Take 1 tablet (325 mg total) by mouth daily.  . hydrALAZINE (APRESOLINE) 100 MG tablet Take 0.5 tablets (50 mg total) by mouth 3 (three) times daily.  . Insulin Glargine (LANTUS SOLOSTAR) 100 UNIT/ML Solostar Pen Inject 60 Units into the skin daily. And pen needles 1/day  . isosorbide mononitrate (IMDUR) 60 MG 24 hr tablet Take 1 tablet (60 mg total) by mouth daily.  . metoprolol tartrate (LOPRESSOR) 25 MG tablet Take 1.5 tablets (37.5 mg total) by mouth 2 (two) times daily.  . ONE TOUCH ULTRA TEST test strip USE 1 STRIP TWICE A DAY  . ONETOUCH DELICA LANCETS 76L MISC     . pantoprazole (PROTONIX) 40 MG tablet Take 1 tablet (40 mg total) by mouth daily before breakfast.  . rosuvastatin (CRESTOR) 20 MG tablet TAKE 1 TABLET BY MOUTH AT BEDTIME NEEDS APPT FOR REFILLS  . tamsulosin (FLOMAX) 0.4 MG CAPS capsule Take 1 capsule (0.4 mg total) by mouth daily.  . vitamin B-12 1000 MCG tablet Take 1 tablet (1,000 mcg total) by mouth daily.  . [DISCONTINUED] furosemide (LASIX) 40 MG tablet Take 60 mg by mouth daily. 1.5 tablet daily     Allergies:   Patient has no known allergies.   Social History   Socioeconomic History  . Marital status: Divorced    Spouse name: Not on file  . Number of children: 2  . Years of education: Not on file  . Highest education level: Not on file  Occupational History  . Occupation: retired    Fish farm manager: [employer]  Tobacco Use  . Smoking status: Former Smoker    Types: Cigarettes    Quit date: 08/13/1994  Years since quitting: 25.1  . Smokeless tobacco: Never Used  Substance and Sexual Activity  . Alcohol use: No    Alcohol/week: 0.0 standard drinks  . Drug use: No  . Sexual activity: Not Currently    Birth control/protection: None  Other Topics Concern  . Not on file  Social History Narrative  . Not on file   Social Determinants of Health   Financial Resource Strain:   . Difficulty of Paying Living Expenses: Not on file  Food Insecurity:   . Worried About Charity fundraiser in the Last Year: Not on file  . Ran Out of Food in the Last Year: Not on file  Transportation Needs:   . Lack of Transportation (Medical): Not on file  . Lack of Transportation (Non-Medical): Not on file  Physical Activity:   . Days of Exercise per Week: Not on file  . Minutes of Exercise per Session: Not on file  Stress:   . Feeling of Stress : Not on file  Social Connections:   . Frequency of Communication with Friends and Family: Not on file  . Frequency of Social Gatherings with Friends and Family: Not on file  . Attends Religious  Services: Not on file  . Active Member of Clubs or Organizations: Not on file  . Attends Archivist Meetings: Not on file  . Marital Status: Not on file     Family History:  The patient's   family history includes Cancer in his brother and father; Coronary artery disease in his brother; Diabetes in his brother and mother; Stroke in his mother.   ROS:   Please see the history of present illness.    ROS All other systems reviewed and are negative.   PHYSICAL EXAM:   VS:  BP (!) 146/50   Pulse (!) 57   Ht 5\' 7"  (1.702 m)   Wt 209 lb (94.8 kg)   SpO2 97%   BMI 32.73 kg/m   Physical Exam  GEN: Well nourished, well developed, in no acute distress  Neck: no JVD, carotid bruits, or masses Cardiac:RRR; no murmurs, rubs, or gallops  Respiratory:  clear to auscultation bilaterally, normal work of breathing GI: soft, nontender, nondistended, + BS Ext: plus 2-3 edema without cyanosis, clubbing, , Good distal pulses bilaterally Neuro:  Alert and Oriented x 3 Psych: euthymic mood, full affect  Wt Readings from Last 3 Encounters:  09/30/19 209 lb (94.8 kg)  09/26/19 207 lb (93.9 kg)  09/19/19 201 lb (91.2 kg)      Studies/Labs Reviewed:   EKG:  EKG is not ordered today.    Recent Labs: 11/27/2018: Magnesium 2.8 08/27/2019: B Natriuretic Peptide 502.5; TSH 1.445 09/08/2019: ALT 7 09/26/2019: BUN 26; Creatinine, Ser 1.94; Hemoglobin 10.0; NT-Pro BNP 1,936; Platelets 229; Potassium 5.7; Sodium 142   Lipid Panel    Component Value Date/Time   CHOL 138 11/12/2018 1528   TRIG 214.0 (H) 11/12/2018 1528   HDL 25.60 (L) 11/12/2018 1528   CHOLHDL 5 11/12/2018 1528   VLDL 42.8 (H) 11/12/2018 1528   LDLCALC 56 06/22/2017 0516   LDLDIRECT 80.0 11/12/2018 1528    Additional studies/ records that were reviewed today include:   2D echo 11/13/2020IMPRESSIONS      1. Left ventricular ejection fraction, by visual estimation, is 55 to 60%. The left ventricle has normal function.  There is mildly increased left ventricular hypertrophy.  2. Abnormal septal motion consistent with post-operative status.  3. Elevated left atrial pressure.  4. Left ventricular diastolic parameters are consistent with Grade II diastolic dysfunction (pseudonormalization).  5. The left ventricle has no regional wall motion abnormalities.  6. Global right ventricle has mildly reduced systolic function.The right ventricular size is normal. No increase in right ventricular wall thickness.  7. Left atrial size was severely dilated.  8. Right atrial size was normal.  9. The mitral valve is normal in structure. Mild to moderate mitral valve regurgitation. 10. The tricuspid valve is normal in structure. Tricuspid valve regurgitation is not demonstrated. 11. The aortic valve is normal in structure. Aortic valve regurgitation is not visualized. 12. The pulmonic valve was grossly normal. Pulmonic valve regurgitation is not visualized. 13. TR signal is inadequate for assessing pulmonary artery systolic pressure.   Left Heart Catheterization 11/20/2018:  Prox RCA to Mid RCA lesion is 30% stenosed.  RPDA lesion is 99% stenosed.  Post Atrio lesion is 100% stenosed.  Ost 2nd Diag to 2nd Diag lesion is 99% stenosed.  Prox LAD lesion is 95% stenosed.  Ost 1st Diag lesion is 80% stenosed.  Ost Cx to Prox Cx lesion is 70% stenosed.  Ost LM lesion is 70% stenosed.   1. Moderately severe ostial left main stenosis.  2. Severe stenosis in the heavily calcified mid LAD 3. Moderately severe proximal Circumflex stenosis 4. The RCA has mild calcific disease through the proximal and mid segments. The PDA is a moderate caliber vessel with severe stenosis. 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  5. Elevated LVEDP   Recommendations: He has severe left main and three vessel CAD. I will ask CT surgery to see him to discuss CABG. Given chronic kidney disease, I  will admit him for hydration. Will hold Plavix. He will need Plavix washout prior to surgery. Will arrange echo tomorrow to assess LVEF. Will start ASA. Continue beta blocker and statin. Given elevated filling pressures, one dose of IV Lasix tonight.    Echocardiogram 11/21/2018: Impressions: 1. The left ventricle has normal systolic function of 48-54%. The cavity size was mildly increased. There is no increased left ventricular wall thickness. Echo evidence of impaired diastolic relaxation.  2. The right ventricle has normal systolic function. The cavity was normal. There is no increase in right ventricular wall thickness.  3. Right atrial size was mildly dilated.  4. The mitral valve is normal in structure. There is mild to moderate mitral annular calcification present. No evidence of mitral valve stenosis. No mitral regurgitation.  5. The tricuspid valve is normal in structure.  6. The aortic valve is tricuspid There is mild calcification of the aortic valve. No aortic stenosis.  7. There is mild dilatation of the aortic root and ascending aorta.  8. No evidence of left ventricular regional wall motion abnormalities.  9. No complete TR doppler jet so unable to estimate PA systolic pressure.        ASSESSMENT:    1. Chronic diastolic CHF (congestive heart failure) ([place])   2. History of GI bleed   3. Coronary artery disease involving native coronary artery of native heart without angina pectoris   4. Paroxysmal atrial fibrillation ([facility])   5. Essential hypertension   6. Stage 4 chronic kidney disease ([place])   7. History of CVA (cerebrovascular accident)   8. Mixed hyperlipidemia   9. Urinary frequency      PLAN:  In order of problems listed above:  Acute on Chronic diastolic CHF 2D echo 62/70/3500 LVEF normal with mild  to moderate MR-weight up 8 lbs and increased edema. Will change to demadex 20 mg 2 bid for 3 days then 1 1/2 bid. Check bmet today and in 2 weeks. F/u in 3  weeks.reiterate 2 gm sodium diet  GI bleed 08/2019 treated with 3 transfusions.  GI okay to restart Eliquis hemoglobin 1011/09/05-no further bleeding  CAD status post CABG 11/2018 for left main disease.  Patient has had a lot of trouble with chronic diastolic CHF since then  PAF on Eliquis CHA2DS2-VASc equals 8  Essential hypertension controlled  CKD stage III-IV creatinine 1.94 09/26/2019-awaiting appt with renal  History of cryptogenic stroke 2018  Hyperlipidemia LDL 80 11/12/18  Urinary frequency excessive despite lasix. Will refer to urologist.    Medication Adjustments/Labs and Tests Ordered: Current medicines are reviewed at length with the patient today.  Concerns regarding medicines are outlined above.  Medication changes, Labs and Tests ordered today are listed in the Patient Instructions below. Patient Instructions   Medication Instructions:  Your physician has recommended you make the following change in your medication:   1. STOP: furosemide (lasix)  2. START: torsemide (demadex) 20 mg tablet: Take 2 tablets (40 mg) twice a day for 3 days, then take 1.5 tablets (30 mg) twice a day   If you need a refill on your cardiac medications before your next appointment, please call your pharmacy.   Lab work: TODAY: BMET  If you have labs (blood work) drawn today and your tests are completely normal, you will receive your results only by: Marland Kitchen MyChart Message (if you have MyChart) OR . A paper copy in the mail If you have any lab test that is abnormal or we need to change your treatment, we will call you to review the results.  Testing/Procedures: None ordered  Follow-Up: You have been referred to Urology  . Follow up with Ermalinda Barrios, PA on 10/22/19 at 2:00 PM  Any Other Special Instructions Will Be Listed Below (If Applicable).  Two Gram Sodium Diet 2000 mg  What is Sodium? Sodium is a mineral found naturally in many foods. The most significant source of sodium in  the diet is table salt, which is about 40% sodium.  Processed, convenience, and preserved foods also contain a large amount of sodium.  The body needs only 500 mg of sodium daily to function,  A normal diet provides more than enough sodium even if you do not use salt.  Why Limit Sodium? A build up of sodium in the body can cause thirst, increased blood pressure, shortness of breath, and water retention.  Decreasing sodium in the diet can reduce edema and risk of heart attack or stroke associated with high blood pressure.  Keep in mind that there are many other factors involved in these health problems.  Heredity, obesity, lack of exercise, cigarette smoking, stress and what you eat all play a role.  General Guidelines:  Do not add salt at the table or in cooking.  One teaspoon of salt contains over 2 grams of sodium.  Read food labels  Avoid processed and convenience foods  Ask your dietitian before eating any foods not dicussed in the menu planning guidelines  Consult your physician if you wish to use a salt substitute or a sodium containing medication such as antacids.  Limit milk and milk products to 16 oz (2 cups) per day.  Shopping Hints:  READ LABELS!! "Dietetic" does not necessarily mean low sodium.  Salt and other sodium ingredients are often added  to foods during processing.   Menu Planning Guidelines Food Group Choose More Often Avoid  Beverages (see also the milk group All fruit juices, low-sodium, salt-free vegetables juices, low-sodium carbonated beverages Regular vegetable or tomato juices, commercially softened water used for drinking or cooking  Breads and Cereals Enriched white, wheat, rye and pumpernickel bread, hard rolls and dinner rolls; muffins, cornbread and waffles; most dry cereals, cooked cereal without added salt; unsalted crackers and breadsticks; low sodium or homemade bread crumbs Bread, rolls and crackers with salted tops; quick breads; instant hot cereals;  pancakes; commercial bread stuffing; self-rising flower and biscuit mixes; regular bread crumbs or cracker crumbs  Desserts and Sweets Desserts and sweets mad with mild should be within allowance Instant pudding mixes and cake mixes  Fats Butter or margarine; vegetable oils; unsalted salad dressings, regular salad dressings limited to 1 Tbs; light, sour and heavy cream Regular salad dressings containing bacon fat, bacon bits, and salt pork; snack dips made with instant soup mixes or processed cheese; salted nuts  Fruits Most fresh, frozen and canned fruits Fruits processed with salt or sodium-containing ingredient (some dried fruits are processed with sodium sulfites        Vegetables Fresh, frozen vegetables and low- sodium canned vegetables Regular canned vegetables, sauerkraut, pickled vegetables, and others prepared in brine; frozen vegetables in sauces; vegetables seasoned with ham, bacon or salt pork  Condiments, Sauces, Miscellaneous  Salt substitute with physician's approval; pepper, herbs, spices; vinegar, lemon or lime juice; hot pepper sauce; garlic powder, onion powder, low sodium soy sauce (1 Tbs.); low sodium condiments (ketchup, chili sauce, mustard) in limited amounts (1 tsp.) fresh ground horseradish; unsalted tortilla chips, pretzels, potato chips, popcorn, salsa (1/4 cup) Any seasoning made with salt including garlic salt, celery salt, onion salt, and seasoned salt; sea salt, rock salt, kosher salt; meat tenderizers; monosodium glutamate; mustard, regular soy sauce, barbecue, sauce, chili sauce, teriyaki sauce, steak sauce, Worcestershire sauce, and most flavored vinegars; canned gravy and mixes; regular condiments; salted snack foods, olives, picles, relish, horseradish sauce, catsup   Food preparation: Try these seasonings Meats:    Pork Sage, onion Serve with applesauce  Chicken Poultry seasoning, thyme, parsley Serve with cranberry sauce  Lamb Curry powder, rosemary, garlic,  thyme Serve with mint sauce or jelly  Veal Marjoram, basil Serve with current jelly, cranberry sauce  Beef Pepper, bay leaf Serve with dry mustard, unsalted chive butter  Fish [place] leaf, dill Serve with unsalted lemon butter, unsalted parsley butter  Vegetables:    Asparagus Lemon juice   Broccoli Lemon juice   Carrots Mustard dressing parsley, mint, nutmeg, glazed with unsalted butter and sugar   Green beans Marjoram, lemon juice, nutmeg,dill seed   Tomatoes Basil, marjoram, onion   Spice /blend for [employer]" 4 tsp ground thyme 1 tsp ground sage 3 tsp ground rosemary 4 tsp ground marjoram   Test your knowledge 1. A product that says "Salt Free" may still contain sodium. True or False 2. Garlic Powder and Hot Pepper Sauce an be used as alternative seasonings.True or False 3. Processed foods have more sodium than fresh foods.  True or False 4. Canned Vegetables have less sodium than froze True or False  WAYS TO DECREASE YOUR SODIUM INTAKE 1. Avoid the use of added salt in cooking and at the table.  Table salt (and other prepared seasonings which contain salt) is probably one of the greatest sources of sodium in the diet.  Unsalted foods can gain flavor from  the sweet, sour, and butter taste sensations of herbs and spices.  Instead of using salt for seasoning, try the following seasonings with the foods listed.  Remember: how you use them to enhance natural food flavors is limited only by your creativity... Allspice-Meat, fish, eggs, fruit, peas, red and yellow vegetables Almond Extract-Fruit baked goods Anise Seed-Sweet breads, fruit, carrots, beets, cottage cheese, cookies (tastes like licorice) Basil-Meat, fish, eggs, vegetables, rice, vegetables salads, soups, sauces Bay Leaf-Meat, fish, stews, poultry Burnet-Salad, vegetables (cucumber-like flavor) Caraway Seed-Bread, cookies, cottage cheese, meat, vegetables, cheese, rice Cardamon-Baked goods, fruit, soups Celery Powder or  seed-Salads, salad dressings, sauces, meatloaf, soup, bread.Do not use  celery salt Chervil-Meats, salads, fish, eggs, vegetables, cottage cheese (parsley-like flavor) Chili Power-Meatloaf, chicken cheese, corn, eggplant, egg dishes Chives-Salads cottage cheese, egg dishes, soups, vegetables, sauces Cilantro-Salsa, casseroles Cinnamon-Baked goods, fruit, pork, lamb, chicken, carrots Cloves-Fruit, baked goods, fish, pot roast, green beans, beets, carrots Coriander-Pastry, cookies, meat, salads, cheese (lemon-orange flavor) Cumin-Meatloaf, fish,cheese, eggs, cabbage,fruit pie (caraway flavor) [employer], fruit, eggs, fish, poultry, cottage cheese, vegetables Dill Seed-Meat, cottage cheese, poultry, vegetables, fish, salads, bread Fennel Seed-Bread, cookies, apples, pork, eggs, fish, beets, cabbage, cheese, Licorice-like flavor Garlic-(buds or powder) Salads, meat, poultry, fish, bread, butter, vegetables, potatoes.Do not  use garlic salt Ginger-Fruit, vegetables, baked goods, meat, fish, poultry Horseradish Root-Meet, vegetables, butter Lemon Juice or Extract-Vegetables, fruit, tea, baked goods, fish salads Mace-Baked goods fruit, vegetables, fish, poultry (taste like nutmeg) Maple Extract-Syrups Marjoram-Meat, chicken, fish, vegetables, breads, green salads (taste like Sage) Mint-Tea, lamb, sherbet, vegetables, desserts, carrots, cabbage Mustard, Dry or Seed-Cheese, eggs, meats, vegetables, poultry Nutmeg-Baked goods, fruit, chicken, eggs, vegetables, desserts Onion Powder-Meat, fish, poultry, vegetables, cheese, eggs, bread, rice salads (Do not use   Onion salt) Orange Extract-Desserts, baked goods Oregano-Pasta, eggs, cheese, onions, pork, lamb, fish, chicken, vegetables, green salads Paprika-Meat, fish, poultry, eggs, cheese, vegetables Parsley Flakes-Butter, vegetables, meat fish, poultry, eggs, bread, salads (certain forms may   Contain sodium Pepper-Meat fish, poultry,  vegetables, eggs Peppermint Extract-Desserts, baked goods Poppy Seed-Eggs, bread, cheese, fruit dressings, baked goods, noodles, vegetables, cottage  [employer], poultry, meat, fish, cauliflower, turnips,eggs bread Saffron-Rice, bread, veal, chicken, fish, eggs Sage-Meat, fish, poultry, onions, eggplant, tomateos, pork, stews Savory-Eggs, salads, poultry, meat, rice, vegetables, soups, pork Tarragon-Meat, poultry, fish, eggs, butter, vegetables (licorice-like flavor)  Thyme-Meat, poultry, fish, eggs, vegetables, (clover-like flavor), sauces, soups Tumeric-Salads, butter, eggs, fish, rice, vegetables (saffron-like flavor) Vanilla Extract-Baked goods, candy Vinegar-Salads, vegetables, meat marinades Walnut Extract-baked goods, candy  2. Choose your Foods Wisely   The following is a list of foods to avoid which are high in sodium:  Meats-Avoid all smoked, canned, salt cured, dried and kosher meat and fish as well as Anchovies   [employer] meats:Bologna, Liverwurst, Pastrami Canned meat or fish  Marinated herring Caviar    Pepperoni Corned Beef   Pizza Dried chipped beef  Salami Frozen breaded fish or meat Salt pork Frankfurters or hot dogs  Sardines Gefilte fish   Sausage Ham (boiled ham, Proscuitto Smoked butt    spiced ham)   Spam      TV Dinners Vegetables Canned vegetables (Regular) Relish Canned mushrooms  Sauerkraut Olives    Tomato juice Pickles  Bakery and Dessert Products Canned puddings  Cream pies Cheesecake   Decorated cakes Cookies  Beverages/Juices Tomato juice, regular  Gatorade   V-8 vegetable juice, regular  Breads and Cereals Biscuit mixes   Salted potato chips, corn chips, pretzels Bread  stuffing mixes  Salted crackers and rolls Pancake and waffle mixes Self-rising flour  Seasonings Accent    Meat sauces Barbecue sauce  Meat tenderizer Catsup    Monosodium glutamate (MSG) Celery salt   Onion  salt Chili sauce   Prepared mustard Garlic salt   Salt, seasoned salt, sea salt Gravy mixes   Soy sauce Horseradish   Steak sauce Ketchup   Tartar sauce Lite salt    Teriyaki sauce Marinade mixes   Worcestershire sauce  Others Baking powder   Cocoa and cocoa mixes Baking soda   Commercial casserole mixes Candy-caramels, chocolate  Dehydrated soups    Bars, fudge,nougats  Instant rice and pasta mixes Canned broth or soup  Maraschino cherries Cheese, aged and processed cheese and cheese spreads  Learning Assessment Quiz  Indicated T (for True) or F (for False) for each of the following statements:  1. _____ Fresh fruits and vegetables and unprocessed grains are generally low in sodium 2. _____ Water may contain a considerable amount of sodium, depending on the source 3. _____ You can always tell if a food is high in sodium by tasting it 4. _____ Certain laxatives my be high in sodium and should be avoided unless prescribed   by a physician or pharmacist 5. _____ Salt substitutes may be used freely by anyone on a sodium restricted diet 6. _____ Sodium is present in table salt, food additives and as a natural component of   most foods 7. _____ Table salt is approximately 90% sodium 8. _____ Limiting sodium intake may help prevent excess fluid accumulation in the body 9. _____ On a sodium-restricted diet, seasonings such as bouillon soy sauce, and    cooking wine should be used in place of table salt 10. _____ On an ingredient list, a product which lists monosodium glutamate as the first   ingredient is an appropriate food to include on a low sodium diet  Circle the best answer(s) to the following statements (Hint: there may be more than one correct answer)  11. On a low-sodium diet, some acceptable snack items are:    A. Olives  F. Bean dip   K. Grapefruit juice    B. Salted Pretzels G. Commercial Popcorn   L. Canned peaches    C. Carrot Sticks  H. Bouillon   M. Unsalted  nuts   D. [place] fries  I. Peanut butter crackers N. Salami   E. Sweet pickles J. Tomato Juice   O. Pizza  12.  Seasonings that may be used freely on a reduced - sodium diet include   A. Lemon wedges F.Monosodium glutamate K. Celery seed    B.Soysauce   G. Pepper   L. Mustard powder   C. Sea salt  H. Cooking wine  M. Onion flakes   D. Vinegar  E. Prepared horseradish N. Salsa   E. Sage   J. Worcestershire sauce  O. [address]      Sumner Boast, PA-C  09/30/2019 11:45 AM    [facility] [place], [place], [place]  [address] Phone: [phone]; Fax: [phone]

## 2019-09-30 ENCOUNTER — Ambulatory Visit (INDEPENDENT_AMBULATORY_CARE_PROVIDER_SITE_OTHER): Payer: Medicare Other | Admitting: Physician Assistant

## 2019-09-30 ENCOUNTER — Encounter: Payer: Self-pay | Admitting: Physician Assistant

## 2019-09-30 ENCOUNTER — Other Ambulatory Visit: Payer: Self-pay

## 2019-09-30 VITALS — BP 146/50 | HR 57 | Ht 67.0 in | Wt 209.0 lb

## 2019-09-30 DIAGNOSIS — R350 Frequency of micturition: Secondary | ICD-10-CM

## 2019-09-30 DIAGNOSIS — I10 Essential (primary) hypertension: Secondary | ICD-10-CM

## 2019-09-30 DIAGNOSIS — Z8719 Personal history of other diseases of the digestive system: Secondary | ICD-10-CM | POA: Diagnosis not present

## 2019-09-30 DIAGNOSIS — Z8673 Personal history of transient ischemic attack (TIA), and cerebral infarction without residual deficits: Secondary | ICD-10-CM | POA: Diagnosis not present

## 2019-09-30 DIAGNOSIS — E782 Mixed hyperlipidemia: Secondary | ICD-10-CM | POA: Diagnosis not present

## 2019-09-30 DIAGNOSIS — I248 Other forms of acute ischemic heart disease: Secondary | ICD-10-CM

## 2019-09-30 DIAGNOSIS — N184 Chronic kidney disease, stage 4 (severe): Secondary | ICD-10-CM | POA: Diagnosis not present

## 2019-09-30 DIAGNOSIS — I480 Paroxysmal atrial fibrillation: Secondary | ICD-10-CM

## 2019-09-30 DIAGNOSIS — I5032 Chronic diastolic (congestive) heart failure: Secondary | ICD-10-CM | POA: Diagnosis not present

## 2019-09-30 DIAGNOSIS — I2510 Atherosclerotic heart disease of native coronary artery without angina pectoris: Secondary | ICD-10-CM

## 2019-09-30 MED ORDER — TORSEMIDE 20 MG PO TABS: 30.0000 mg | ORAL_TABLET | Freq: Two times a day (BID) | ORAL | 3 refills | Status: DC

## 2019-09-30 NOTE — Patient Instructions (Addendum)
Medication Instructions:  Your physician has recommended you make the following change in your medication:   1. STOP: furosemide (lasix)  2. START: torsemide (demadex) 20 mg tablet: Take 2 tablets (40 mg) twice a day for 3 days, then take 1.5 tablets (30 mg) twice a day   If you need a refill on your cardiac medications before your next appointment, please call your pharmacy.   Lab work: TODAY: BMET  If you have labs (blood work) drawn today and your tests are completely normal, you will receive your results only by: Marland Kitchen MyChart Message (if you have MyChart) OR . A paper copy in the mail If you have any lab test that is abnormal or we need to change your treatment, we will call you to review the results.  Testing/Procedures: None ordered  Follow-Up: You have been referred to Urology  . Follow up with Ermalinda Barrios, PA on 10/22/19 at 2:00 PM  Any Other Special Instructions Will Be Listed Below (If Applicable).  Two Gram Sodium Diet 2000 mg  What is Sodium? Sodium is a mineral found naturally in many foods. The most significant source of sodium in the diet is table salt, which is about 40% sodium.  Processed, convenience, and preserved foods also contain a large amount of sodium.  The body needs only 500 mg of sodium daily to function,  A normal diet provides more than enough sodium even if you do not use salt.  Why Limit Sodium? A build up of sodium in the body can cause thirst, increased blood pressure, shortness of breath, and water retention.  Decreasing sodium in the diet can reduce edema and risk of heart attack or stroke associated with high blood pressure.  Keep in mind that there are many other factors involved in these health problems.  Heredity, obesity, lack of exercise, cigarette smoking, stress and what you eat all play a role.  General Guidelines:  Do not add salt at the table or in cooking.  One teaspoon of salt contains over 2 grams of sodium.  Read food  labels  Avoid processed and convenience foods  Ask your dietitian before eating any foods not dicussed in the menu planning guidelines  Consult your physician if you wish to use a salt substitute or a sodium containing medication such as antacids.  Limit milk and milk products to 16 oz (2 cups) per day.  Shopping Hints:  READ LABELS!! "Dietetic" does not necessarily mean low sodium.  Salt and other sodium ingredients are often added to foods during processing.   Menu Planning Guidelines Food Group Choose More Often Avoid  Beverages (see also the milk group All fruit juices, low-sodium, salt-free vegetables juices, low-sodium carbonated beverages Regular vegetable or tomato juices, commercially softened water used for drinking or cooking  Breads and Cereals Enriched white, wheat, rye and pumpernickel bread, hard rolls and dinner rolls; muffins, cornbread and waffles; most dry cereals, cooked cereal without added salt; unsalted crackers and breadsticks; low sodium or homemade bread crumbs Bread, rolls and crackers with salted tops; quick breads; instant hot cereals; pancakes; commercial bread stuffing; self-rising flower and biscuit mixes; regular bread crumbs or cracker crumbs  Desserts and Sweets Desserts and sweets mad with mild should be within allowance Instant pudding mixes and cake mixes  Fats Butter or margarine; vegetable oils; unsalted salad dressings, regular salad dressings limited to 1 Tbs; light, sour and heavy cream Regular salad dressings containing bacon fat, bacon bits, and salt pork; snack dips made with instant  soup mixes or processed cheese; salted nuts  Fruits Most fresh, frozen and canned fruits Fruits processed with salt or sodium-containing ingredient (some dried fruits are processed with sodium sulfites        Vegetables Fresh, frozen vegetables and low- sodium canned vegetables Regular canned vegetables, sauerkraut, pickled vegetables, and others prepared in  brine; frozen vegetables in sauces; vegetables seasoned with ham, bacon or salt pork  Condiments, Sauces, Miscellaneous  Salt substitute with physician's approval; pepper, herbs, spices; vinegar, lemon or lime juice; hot pepper sauce; garlic powder, onion powder, low sodium soy sauce (1 Tbs.); low sodium condiments (ketchup, chili sauce, mustard) in limited amounts (1 tsp.) fresh ground horseradish; unsalted tortilla chips, pretzels, potato chips, popcorn, salsa (1/4 cup) Any seasoning made with salt including garlic salt, celery salt, onion salt, and seasoned salt; sea salt, rock salt, kosher salt; meat tenderizers; monosodium glutamate; mustard, regular soy sauce, barbecue, sauce, chili sauce, teriyaki sauce, steak sauce, Worcestershire sauce, and most flavored vinegars; canned gravy and mixes; regular condiments; salted snack foods, olives, picles, relish, horseradish sauce, catsup   Food preparation: Try these seasonings Meats:    Pork Sage, onion Serve with applesauce  Chicken Poultry seasoning, thyme, parsley Serve with cranberry sauce  Lamb Curry powder, rosemary, garlic, thyme Serve with mint sauce or jelly  Veal Marjoram, basil Serve with current jelly, cranberry sauce  Beef Pepper, bay leaf Serve with dry mustard, unsalted chive butter  Fish [place] leaf, dill Serve with unsalted lemon butter, unsalted parsley butter  Vegetables:    Asparagus Lemon juice   Broccoli Lemon juice   Carrots Mustard dressing parsley, mint, nutmeg, glazed with unsalted butter and sugar   Green beans Marjoram, lemon juice, nutmeg,dill seed   Tomatoes Basil, marjoram, onion   Spice /blend for [employer]" 4 tsp ground thyme 1 tsp ground sage 3 tsp ground rosemary 4 tsp ground marjoram   Test your knowledge 1. A product that says "Salt Free" may still contain sodium. True or False 2. Garlic Powder and Hot Pepper Sauce an be used as alternative seasonings.True or False 3. Processed foods have more sodium than  fresh foods.  True or False 4. Canned Vegetables have less sodium than froze True or False  WAYS TO DECREASE YOUR SODIUM INTAKE 1. Avoid the use of added salt in cooking and at the table.  Table salt (and other prepared seasonings which contain salt) is probably one of the greatest sources of sodium in the diet.  Unsalted foods can gain flavor from the sweet, sour, and butter taste sensations of herbs and spices.  Instead of using salt for seasoning, try the following seasonings with the foods listed.  Remember: how you use them to enhance natural food flavors is limited only by your creativity... Allspice-Meat, fish, eggs, fruit, peas, red and yellow vegetables Almond Extract-Fruit baked goods Anise Seed-Sweet breads, fruit, carrots, beets, cottage cheese, cookies (tastes like licorice) Basil-Meat, fish, eggs, vegetables, rice, vegetables salads, soups, sauces Bay Leaf-Meat, fish, stews, poultry Burnet-Salad, vegetables (cucumber-like flavor) Caraway Seed-Bread, cookies, cottage cheese, meat, vegetables, cheese, rice Cardamon-Baked goods, fruit, soups Celery Powder or seed-Salads, salad dressings, sauces, meatloaf, soup, bread.Do not use  celery salt Chervil-Meats, salads, fish, eggs, vegetables, cottage cheese (parsley-like flavor) Chili Power-Meatloaf, chicken cheese, corn, eggplant, egg dishes Chives-Salads cottage cheese, egg dishes, soups, vegetables, sauces Cilantro-Salsa, casseroles Cinnamon-Baked goods, fruit, pork, lamb, chicken, carrots Cloves-Fruit, baked goods, fish, pot roast, green beans, beets, carrots Coriander-Pastry, cookies, meat, salads, cheese (lemon-orange flavor) Cumin-Meatloaf, fish,cheese, eggs,  cabbage,fruit pie (caraway flavor) [employer], fruit, eggs, fish, poultry, cottage cheese, vegetables Dill Seed-Meat, cottage cheese, poultry, vegetables, fish, salads, bread Fennel Seed-Bread, cookies, apples, pork, eggs, fish, beets, cabbage, cheese, Licorice-like  flavor Garlic-(buds or powder) Salads, meat, poultry, fish, bread, butter, vegetables, potatoes.Do not  use garlic salt Ginger-Fruit, vegetables, baked goods, meat, fish, poultry Horseradish Root-Meet, vegetables, butter Lemon Juice or Extract-Vegetables, fruit, tea, baked goods, fish salads Mace-Baked goods fruit, vegetables, fish, poultry (taste like nutmeg) Maple Extract-Syrups Marjoram-Meat, chicken, fish, vegetables, breads, green salads (taste like Sage) Mint-Tea, lamb, sherbet, vegetables, desserts, carrots, cabbage Mustard, Dry or Seed-Cheese, eggs, meats, vegetables, poultry Nutmeg-Baked goods, fruit, chicken, eggs, vegetables, desserts Onion Powder-Meat, fish, poultry, vegetables, cheese, eggs, bread, rice salads (Do not use   Onion salt) Orange Extract-Desserts, baked goods Oregano-Pasta, eggs, cheese, onions, pork, lamb, fish, chicken, vegetables, green salads Paprika-Meat, fish, poultry, eggs, cheese, vegetables Parsley Flakes-Butter, vegetables, meat fish, poultry, eggs, bread, salads (certain forms may   Contain sodium Pepper-Meat fish, poultry, vegetables, eggs Peppermint Extract-Desserts, baked goods Poppy Seed-Eggs, bread, cheese, fruit dressings, baked goods, noodles, vegetables, cottage  [employer], poultry, meat, fish, cauliflower, turnips,eggs bread Saffron-Rice, bread, veal, chicken, fish, eggs Sage-Meat, fish, poultry, onions, eggplant, tomateos, pork, stews Savory-Eggs, salads, poultry, meat, rice, vegetables, soups, pork Tarragon-Meat, poultry, fish, eggs, butter, vegetables (licorice-like flavor)  Thyme-Meat, poultry, fish, eggs, vegetables, (clover-like flavor), sauces, soups Tumeric-Salads, butter, eggs, fish, rice, vegetables (saffron-like flavor) Vanilla Extract-Baked goods, candy Vinegar-Salads, vegetables, meat marinades Walnut Extract-baked goods, candy  2. Choose your Foods Wisely   The following is a list  of foods to avoid which are high in sodium:  Meats-Avoid all smoked, canned, salt cured, dried and kosher meat and fish as well as Anchovies   [employer] meats:Bologna, Liverwurst, Pastrami Canned meat or fish  Marinated herring Caviar    Pepperoni Corned Beef   Pizza Dried chipped beef  Salami Frozen breaded fish or meat Salt pork Frankfurters or hot dogs  Sardines Gefilte fish   Sausage Ham (boiled ham, Proscuitto Smoked butt    spiced ham)   Spam      TV Dinners Vegetables Canned vegetables (Regular) Relish Canned mushrooms  Sauerkraut Olives    Tomato juice Pickles  Bakery and Dessert Products Canned puddings  Cream pies Cheesecake   Decorated cakes Cookies  Beverages/Juices Tomato juice, regular  Gatorade   V-8 vegetable juice, regular  Breads and Cereals Biscuit mixes   Salted potato chips, corn chips, pretzels Bread stuffing mixes  Salted crackers and rolls Pancake and waffle mixes Self-rising flour  Seasonings Accent    Meat sauces Barbecue sauce  Meat tenderizer Catsup    Monosodium glutamate (MSG) Celery salt   Onion salt Chili sauce   Prepared mustard Garlic salt   Salt, seasoned salt, sea salt Gravy mixes   Soy sauce Horseradish   Steak sauce Ketchup   Tartar sauce Lite salt    Teriyaki sauce Marinade mixes   Worcestershire sauce  Others Baking powder   Cocoa and cocoa mixes Baking soda   Commercial casserole mixes Candy-caramels, chocolate  Dehydrated soups    Bars, fudge,nougats  Instant rice and pasta mixes Canned broth or soup  Maraschino cherries Cheese, aged and processed cheese and cheese spreads  Learning Assessment Quiz  Indicated T (for True) or F (for False) for each of the following statements:  1. _____ Fresh fruits and vegetables and unprocessed grains are generally low in sodium 2. _____ [employer]  may contain a considerable amount of sodium, depending on the source 3. _____ You can always tell if a food is high in sodium  by tasting it 4. _____ Certain laxatives my be high in sodium and should be avoided unless prescribed   by a physician or pharmacist 5. _____ Salt substitutes may be used freely by anyone on a sodium restricted diet 6. _____ Sodium is present in table salt, food additives and as a natural component of   most foods 7. _____ Table salt is approximately 90% sodium 8. _____ Limiting sodium intake may help prevent excess fluid accumulation in the body 9. _____ On a sodium-restricted diet, seasonings such as bouillon soy sauce, and    cooking wine should be used in place of table salt 10. _____ On an ingredient list, a product which lists monosodium glutamate as the first   ingredient is an appropriate food to include on a low sodium diet  Circle the best answer(s) to the following statements (Hint: there may be more than one correct answer)  11. On a low-sodium diet, some acceptable snack items are:    A. Olives  F. Bean dip   K. Grapefruit juice    B. Salted Pretzels G. Commercial Popcorn   L. Canned peaches    C. Carrot Sticks  H. Bouillon   M. Unsalted nuts   D. [place] fries  I. Peanut butter crackers N. Salami   E. Sweet pickles J. Tomato Juice   O. Pizza  12.  Seasonings that may be used freely on a reduced - sodium diet include   A. Lemon wedges F.Monosodium glutamate K. Celery seed    B.Soysauce   G. Pepper   L. Mustard powder   C. Sea salt  H. Cooking wine  M. Onion flakes   D. Vinegar  E. Prepared horseradish N. Salsa   E. Sage   J. Worcestershire sauce  O. [place]

## 2019-10-01 LAB — BASIC METABOLIC PANEL
BUN/Creatinine Ratio: 16 (ref 10–24)
BUN: 28 mg/dL — ABNORMAL HIGH (ref 8–27)
CO2: 23 mmol/L (ref 20–29)
Calcium: 9.3 mg/dL (ref 8.6–10.2)
Chloride: 112 mmol/L — ABNORMAL HIGH (ref 96–106)
Creatinine, Ser: 1.73 mg/dL — ABNORMAL HIGH (ref 0.76–1.27)
GFR calc Af Amer: 43 mL/min/{1.73_m2} — ABNORMAL LOW (ref >59)
GFR calc non Af Amer: 38 mL/min/{1.73_m2} — ABNORMAL LOW (ref >59)
Glucose: 129 mg/dL — ABNORMAL HIGH (ref 65–99)
Potassium: 5.2 mmol/L (ref 3.5–5.2)
Sodium: 146 mmol/L — ABNORMAL HIGH (ref 134–144)

## 2019-10-01 NOTE — Progress Notes (Signed)
Carelink Summary Report / Loop Recorder 

## 2019-10-06 ENCOUNTER — Ambulatory Visit (INDEPENDENT_AMBULATORY_CARE_PROVIDER_SITE_OTHER): Payer: Medicare Other | Admitting: *Deleted

## 2019-10-06 DIAGNOSIS — I639 Cerebral infarction, unspecified: Secondary | ICD-10-CM | POA: Diagnosis not present

## 2019-10-07 LAB — CUP PACEART REMOTE DEVICE CHECK
Date Time Interrogation Session: 20201221224019
Implantable Pulse Generator Implant Date: 20181030

## 2019-10-13 ENCOUNTER — Encounter: Payer: Self-pay | Admitting: *Deleted

## 2019-10-21 NOTE — Progress Notes (Signed)
Cardiology Office Note    Date:  10/22/2019   ID:  [employer], DOB 1941/03/11, MRN [id number]  PCP:  Jearld Fenton, NP  Cardiologist: Lauree Chandler, MD EPS: Thompson Grayer, MD  Chief Complaint  Patient presents with  . Follow-up    History of Present Illness:  Jake Gross is a 79 y.o. male with history of CAD status post CABG 11/2018 for severe left main disease, prior CVA in 2018, PAF on Eliquis, hypertension, hyperlipidemia, chronic diastolic CHF, DM, CKD stage III-IV.     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  GI said it was okay to restart Eliquis.  CHA2DS2-VASc equals 8 2D echo showed normal LV function with mild to moderate MR.   I had a telemedicine visit with the patient 09/16/2019 and said his breathing was much better but his legs started to swell again.  He was on Lasix 60 mg daily and following a low-salt diet.  I had home health evaluate the patient will check labs.  Labs 09/19/2019 BNP 1867 creatinine 2.14 and chloride 117 which was a critical level.  Patient is not having any symptoms other than edema.  He is scheduled to see renal.  I have him take Lasix 80 mg twice daily for 3 days.  Repeat BNP 09/26/2019 1936 creatinine 1.94 potassium 5.7 and chloride 112.  I saw the patient 09/30/2019 with worsening edema and extreme urination.  I adjusted the time that he was taking his Lasix.  He was also getting extra sodium in his diet.  Referred him to urology.  Patient comes in for f/u. Swelling is down and breathing better. Still urinating all the time. Has appt 11/06/19. BP up today. It was 149/74 this am at home.   Past Medical History:  Diagnosis Date  . 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 Arthritis   . 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 CAD (coronary artery disease) 09/29/2019  . Cervical spondylosis   . Chronic anticoagulation   . Chronic diastolic CHF (congestive heart failure) ([place]) 12/17/2018  . CKD (chronic kidney disease) 12/17/2018  . Diabetes mellitus   . History of GI bleed 09/29/2019  . Hyperlipidemia   . Hypertension   . Iron deficiency anemia due to chronic blood loss   . Paroxysmal atrial fibrillation ([place]) 12/17/2018  . 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 Stroke (cerebrum) ([place]) 06/20/2017  . Vitamin D deficiency     Past Surgical History:  Procedure Laterality Date  . ABCESS DRAINAGE Left    buttocks  . CARDIOVASCULAR STRESS TEST  10/12/1999   EF 63%. NO ISCHEMIA  . COLONOSCOPY W/ POLYPECTOMY     5 polyps  . COLONOSCOPY WITH PROPOFOL N/A 08/29/2019   Procedure: COLONOSCOPY WITH PROPOFOL;  Surgeon: Doran Stabler, MD;  Location: [facility];  Service: Gastroenterology;  Laterality: N/A;  . CORONARY ARTERY BYPASS GRAFT N/A 11/26/2018   Procedure: CORONARY ARTERY BYPASS GRAFTING (CABG), ON PUMP, TIMES FOUR, USING LEFT INTERNAL MAMMARY ARTERY AND ENDOSCOPICALLY HARVESTED LEFT SAPHENOUS VEIN;  Surgeon: Gaye Pollack, MD;  Location: [facility];  Service: Open Heart Surgery;  Laterality: N/A;  .  ESOPHAGOGASTRODUODENOSCOPY (EGD) WITH PROPOFOL N/A 08/29/2019   Procedure: ESOPHAGOGASTRODUODENOSCOPY (EGD) WITH PROPOFOL;  Surgeon: Doran Stabler, MD;  Location: [facility];  Service: Gastroenterology;  Laterality: N/A;  . EUS N/A 03/11/2015   Procedure: LOWER ENDOSCOPIC ULTRASOUND (EUS);  Surgeon: Milus Banister, MD;  Location: [facility];  Service: Endoscopy;  Laterality: N/A;  . FLEXIBLE SIGMOIDOSCOPY N/A 02/02/2015   Procedure: FLEXIBLE SIGMOIDOSCOPY;  Surgeon: Inda Castle, MD;  Location: [facility];  Service: Endoscopy;  Laterality: N/A;  ERBE  . HEMOSTASIS CLIP PLACEMENT   08/29/2019   Procedure: HEMOSTASIS CLIP PLACEMENT;  Surgeon: Doran Stabler, MD;  Location: [facility];  Service: Gastroenterology;;  . HOT HEMOSTASIS N/A 08/29/2019   Procedure: HOT HEMOSTASIS (ARGON PLASMA COAGULATION/BICAP);  Surgeon: Doran Stabler, MD;  Location: [facility];  Service: Gastroenterology;  Laterality: N/A;  . LEFT HEART CATH AND CORONARY ANGIOGRAPHY N/A 11/20/2018   Procedure: LEFT HEART CATH AND CORONARY ANGIOGRAPHY;  Surgeon: Burnell Blanks, MD;  Location: [facility];  Service: Cardiovascular;  Laterality: N/A;  . LOOP RECORDER INSERTION N/A 08/14/2017   Procedure: LOOP RECORDER INSERTION;  Surgeon: Thompson Grayer, MD;  Location: [facility];  Service: Cardiovascular;  Laterality: N/A;  . PARTIAL PROCTECTOMY BY TEM N/A 04/08/2015   Procedure: TEM PARTIAL PROCTECTOMY OF RECTAL MASS;  Surgeon: Michael Boston, MD;  Location: [facility];  Service: General;  Laterality: N/A;  . POLYPECTOMY  08/29/2019   Procedure: POLYPECTOMY;  Surgeon: Doran Stabler, MD;  Location: [facility];  Service: Gastroenterology;;  . TEE WITHOUT CARDIOVERSION N/A 06/25/2017   Procedure: TRANSESOPHAGEAL ECHOCARDIOGRAM (TEE);  Surgeon: Skeet Latch, MD;  Location: [facility];  Service: Cardiovascular;  Laterality: N/A;  . TEE WITHOUT CARDIOVERSION N/A 11/26/2018   Procedure: TRANSESOPHAGEAL ECHOCARDIOGRAM (TEE);  Surgeon: Gaye Pollack, MD;  Location: [facility];  Service: Open Heart Surgery;  Laterality: N/A;  . TONSILLECTOMY AND ADENOIDECTOMY     as child    Current Medications: Current Meds  Medication Sig  . amLODipine (NORVASC) 5 MG tablet Take 1 tablet (5 mg total) by mouth daily.  . fenofibrate micronized (LOFIBRA) 67 MG capsule TAKE 1 CAPSULE DAILY BEFORE BREAKFAST  . ferrous sulfate 325 (65 FE) MG tablet Take 1 tablet (325 mg total) by mouth daily.  . hydrALAZINE (APRESOLINE) 100 MG tablet Take 1 tablet (100 mg total) by mouth 3 (three) times daily.  .  Insulin Glargine (LANTUS SOLOSTAR) 100 UNIT/ML Solostar Pen Inject 60 Units into the skin daily. And pen needles 1/day  . isosorbide mononitrate (IMDUR) 60 MG 24 hr tablet Take 1 tablet (60 mg total) by mouth daily.  . metoprolol tartrate (LOPRESSOR) 25 MG tablet Take 1.5 tablets (37.5 mg total) by mouth 2 (two) times daily.  . ONE TOUCH ULTRA TEST test strip USE 1 STRIP TWICE A DAY  . ONETOUCH DELICA LANCETS 21J MISC   . pantoprazole (PROTONIX) 40 MG tablet Take 1 tablet (40 mg total) by mouth daily before breakfast.  . rosuvastatin (CRESTOR) 20 MG tablet TAKE 1 TABLET BY MOUTH AT BEDTIME NEEDS APPT FOR REFILLS  . tamsulosin (FLOMAX) 0.4 MG CAPS capsule Take 1 capsule (0.4 mg total) by mouth daily.  Marland Kitchen torsemide (DEMADEX) 20 MG tablet Take 1.5 tablets (30 mg total) by mouth 2 (two) times daily.  . [DISCONTINUED] hydrALAZINE (APRESOLINE) 100 MG tablet Take 0.5 tablets (50 mg total) by mouth 3 (three) times daily.  . [DISCONTINUED] metoprolol tartrate (LOPRESSOR) 25 MG tablet Take  1.5 tablets (37.5 mg total) by mouth 2 (two) times daily.     Allergies:   Patient has no known allergies.   Social History   Socioeconomic History  . Marital status: Divorced    Spouse name: Not on file  . Number of children: 2  . Years of education: Not on file  . Highest education level: Not on file  Occupational History  . Occupation: retired    Fish farm manager: [employer]  Tobacco Use  . Smoking status: Former Smoker    Types: Cigarettes    Quit date: 08/13/1994    Years since quitting: 25.2  . Smokeless tobacco: Never Used  Substance and Sexual Activity  . Alcohol use: No    Alcohol/week: 0.0 standard drinks  . Drug use: No  . Sexual activity: Not Currently    Birth control/protection: None  Other Topics Concern  . Not on file  Social History Narrative  . Not on file   Social Determinants of Health   Financial Resource Strain:   . Difficulty of Paying Living Expenses: Not on file  Food Insecurity:   .  Worried About Charity fundraiser in the Last Year: Not on file  . Ran Out of Food in the Last Year: Not on file  Transportation Needs:   . Lack of Transportation (Medical): Not on file  . Lack of Transportation (Non-Medical): Not on file  Physical Activity:   . Days of Exercise per Week: Not on file  . Minutes of Exercise per Session: Not on file  Stress:   . Feeling of Stress : Not on file  Social Connections:   . Frequency of Communication with Friends and Family: Not on file  . Frequency of Social Gatherings with Friends and Family: Not on file  . Attends Religious Services: Not on file  . Active Member of Clubs or Organizations: Not on file  . Attends Archivist Meetings: Not on file  . Marital Status: Not on file     Family History:  The patient's   family history includes Cancer in his brother and father; Coronary artery disease in his brother; Diabetes in his brother and mother; Stroke in his mother.   ROS:   Please see the history of present illness.    ROS All other systems reviewed and are negative.   PHYSICAL EXAM:   VS:  BP (!) 150/68   Pulse 62   Ht '5\' 7"'  (1.702 m)   Wt 208 lb 6.4 oz (94.5 kg)   SpO2 98%   BMI 32.64 kg/m   Physical Exam  GEN: Well nourished, well developed, in no acute distress  Neck: no JVD, carotid bruits, or masses Cardiac:RRR; no murmurs, rubs, or gallops  Respiratory:  clear to auscultation bilaterally, normal work of breathing GI: soft, nontender, nondistended, + BS Ext: brawny erythema  But edema down Neuro:  Alert and Oriented x 3 Psych: euthymic mood, full affect  Wt Readings from Last 3 Encounters:  10/22/19 208 lb 6.4 oz (94.5 kg)  09/30/19 209 lb (94.8 kg)  09/26/19 207 lb (93.9 kg)      Studies/Labs Reviewed:   EKG:  EKG is not ordered today.    Recent Labs: 11/27/2018: Magnesium 2.8 08/27/2019: B Natriuretic Peptide 502.5; TSH 1.445 09/08/2019: ALT 7 09/26/2019: Hemoglobin 10.0; NT-Pro BNP 1,936; Platelets  229 09/30/2019: BUN 28; Creatinine, Ser 1.73; Potassium 5.2; Sodium 146   Lipid Panel    Component Value Date/Time   CHOL 138 11/12/2018 1528  TRIG 214.0 (H) 11/12/2018 1528   HDL 25.60 (L) 11/12/2018 1528   CHOLHDL 5 11/12/2018 1528   VLDL 42.8 (H) 11/12/2018 1528   LDLCALC 56 06/22/2017 0516   LDLDIRECT 80.0 11/12/2018 1528    Additional studies/ records that were reviewed today include:  2D echo 11/13/2020IMPRESSIONS      1. Left ventricular ejection fraction, by visual estimation, is 55 to 60%. The left ventricle has normal function. There is mildly increased left ventricular hypertrophy.  2. Abnormal septal motion consistent with post-operative status.  3. Elevated left atrial pressure.  4. Left ventricular diastolic parameters are consistent with Grade II diastolic dysfunction (pseudonormalization).  5. The left ventricle has no regional wall motion abnormalities.  6. Global right ventricle has mildly reduced systolic function.The right ventricular size is normal. No increase in right ventricular wall thickness.  7. Left atrial size was severely dilated.  8. Right atrial size was normal.  9. The mitral valve is normal in structure. Mild to moderate mitral valve regurgitation. 10. The tricuspid valve is normal in structure. Tricuspid valve regurgitation is not demonstrated. 11. The aortic valve is normal in structure. Aortic valve regurgitation is not visualized. 12. The pulmonic valve was grossly normal. Pulmonic valve regurgitation is not visualized. 13. TR signal is inadequate for assessing pulmonary artery systolic pressure.   Left Heart Catheterization 11/20/2018:  Prox RCA to Mid RCA lesion is 30% stenosed.  RPDA lesion is 99% stenosed.  Post Atrio lesion is 100% stenosed.  Ost 2nd Diag to 2nd Diag lesion is 99% stenosed.  Prox LAD lesion is 95% stenosed.  Ost 1st Diag lesion is 80% stenosed.  Ost Cx to Prox Cx lesion is 70% stenosed.  Ost LM lesion is 70%  stenosed.   1. Moderately severe ostial left main stenosis.  2. Severe stenosis in the heavily calcified mid LAD 3. Moderately severe proximal Circumflex stenosis 4. The RCA has mild calcific disease through the proximal and mid segments. The PDA is a moderate caliber vessel with severe stenosis. 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  5. Elevated LVEDP   Recommendations: He has severe left main and three vessel CAD. I will ask CT surgery to see him to discuss CABG. Given chronic kidney disease, I will admit him for hydration. Will hold Plavix. He will need Plavix washout prior to surgery. Will arrange echo tomorrow to assess LVEF. Will start ASA. Continue beta blocker and statin. Given elevated filling pressures, one dose of IV Lasix tonight.    Echocardiogram 11/21/2018: Impressions: 1. The left ventricle has normal systolic function of 96-28%. The cavity size was mildly increased. There is no increased left ventricular wall thickness. Echo evidence of impaired diastolic relaxation.  2. The right ventricle has normal systolic function. The cavity was normal. There is no increase in right ventricular wall thickness.  3. Right atrial size was mildly dilated.  4. The mitral valve is normal in structure. There is mild to moderate mitral annular calcification present. No evidence of mitral valve stenosis. No mitral regurgitation.  5. The tricuspid valve is normal in structure.  6. The aortic valve is tricuspid There is mild calcification of the aortic valve. No aortic stenosis.  7. There is mild dilatation of the aortic root and ascending aorta.  8. No evidence of left ventricular regional wall motion abnormalities.  9. No complete TR doppler jet so unable to estimate PA systolic pressure.  ASSESSMENT:    1. Chronic diastolic CHF (congestive heart failure) ([place])   2. Coronary artery disease involving native coronary artery of  native heart without angina pectoris   3. Paroxysmal atrial fibrillation ([facility])   4. Essential hypertension   5. Stage 4 chronic kidney disease ([place])   6. Mixed hyperlipidemia   7. History of CVA (cerebrovascular accident)      PLAN:  In order of problems listed above:  Chronic diastolic CHF normal LVEF on echo 08/29/2019 with mild to moderate MR.  Last office visit weight was up 8 pounds with increased edema.  I increased his Demadex to 40 mg twice daily for 3 days then back to 30 mg twice daily.  Weight is only down 1 pound but edema is down and breathing is normal.  Will check be met today and BNP.  Still has urinary frequency despite changing time he takes Demadex.  Has an appointment with urology 11/06/2019  GI bleed 08/2019 treated with 3 transfusions GI said it was okay to restart Eliquis.  He has had no further bleeding.  Hemoglobin 1011/09/05  CAD status post CABG 11/2018 for left main disease.  He has had trouble recovering from it.  PAF on Eliquis CHA2DS2-VASc equals 8  Essential hypertension blood pressure up today and has been running high at home.  Increase hydralazine to 100 mg 3 times a day.  CKD stage III-IV creatinine 1.73 09/30/2019 which was improved-awaiting appointment with renal in February  Hyperlipidemia LDL 81/28/20 will need to repeat  History of cryptogenic stroke in 2018.    Medication Adjustments/Labs and Tests Ordered: Current medicines are reviewed at length with the patient today.  Concerns regarding medicines are outlined above.  Medication changes, Labs and Tests ordered today are listed in the Patient Instructions below. Patient Instructions  Medication Instructions:  Your physician has recommended you make the following change in your medication:   INCREASE: hydralazine to 100 mg three times a day  *If you need a refill on your cardiac medications before your next appointment, please call your pharmacy*  Lab Work: TODAY: BMET, BNP  If you  have labs (blood work) drawn today and your tests are completely normal, you will receive your results only by: Marland Kitchen MyChart Message (if you have MyChart) OR . A paper copy in the mail If you have any lab test that is abnormal or we need to change your treatment, we will call you to review the results.  Testing/Procedures: None ordered  Follow-Up: Follow up with Ermalinda Barrios, PA on 12/24/19 at 1:15 PM  Other Instructions      Signed, Ermalinda Barrios, PA-C  10/22/2019 2:37 PM    [address], [place], [place]  [address] Phone: [phone]; Fax: [phone]

## 2019-10-22 ENCOUNTER — Other Ambulatory Visit: Payer: Self-pay

## 2019-10-22 ENCOUNTER — Other Ambulatory Visit: Payer: Self-pay | Admitting: Physician Assistant

## 2019-10-22 ENCOUNTER — Encounter: Payer: Self-pay | Admitting: Physician Assistant

## 2019-10-22 ENCOUNTER — Encounter (INDEPENDENT_AMBULATORY_CARE_PROVIDER_SITE_OTHER): Payer: Self-pay

## 2019-10-22 ENCOUNTER — Ambulatory Visit (INDEPENDENT_AMBULATORY_CARE_PROVIDER_SITE_OTHER): Payer: Medicare Other | Admitting: Physician Assistant

## 2019-10-22 VITALS — BP 150/68 | HR 62 | Ht 67.0 in | Wt 208.4 lb

## 2019-10-22 DIAGNOSIS — I480 Paroxysmal atrial fibrillation: Secondary | ICD-10-CM | POA: Diagnosis not present

## 2019-10-22 DIAGNOSIS — Z8673 Personal history of transient ischemic attack (TIA), and cerebral infarction without residual deficits: Secondary | ICD-10-CM

## 2019-10-22 DIAGNOSIS — N184 Chronic kidney disease, stage 4 (severe): Secondary | ICD-10-CM | POA: Diagnosis not present

## 2019-10-22 DIAGNOSIS — E782 Mixed hyperlipidemia: Secondary | ICD-10-CM | POA: Diagnosis not present

## 2019-10-22 DIAGNOSIS — I2510 Atherosclerotic heart disease of native coronary artery without angina pectoris: Secondary | ICD-10-CM | POA: Diagnosis not present

## 2019-10-22 DIAGNOSIS — I5032 Chronic diastolic (congestive) heart failure: Secondary | ICD-10-CM | POA: Diagnosis not present

## 2019-10-22 DIAGNOSIS — I10 Essential (primary) hypertension: Secondary | ICD-10-CM | POA: Diagnosis not present

## 2019-10-22 DIAGNOSIS — R600 Localized edema: Secondary | ICD-10-CM

## 2019-10-22 MED ORDER — HYDRALAZINE HCL 100 MG PO TABS: 100.0000 mg | ORAL_TABLET | Freq: Three times a day (TID) | ORAL | 3 refills | Status: DC

## 2019-10-22 MED ORDER — METOPROLOL TARTRATE 25 MG PO TABS: 37.5000 mg | ORAL_TABLET | Freq: Two times a day (BID) | ORAL | 3 refills | Status: DC

## 2019-10-22 NOTE — Patient Instructions (Signed)
Medication Instructions:  Your physician has recommended you make the following change in your medication:   INCREASE: hydralazine to 100 mg three times a day  *If you need a refill on your cardiac medications before your next appointment, please call your pharmacy*  Lab Work: TODAY: BMET, BNP  If you have labs (blood work) drawn today and your tests are completely normal, you will receive your results only by: Marland Kitchen MyChart Message (if you have MyChart) OR . A paper copy in the mail If you have any lab test that is abnormal or we need to change your treatment, we will call you to review the results.  Testing/Procedures: None ordered  Follow-Up: Follow up with Ermalinda Barrios, PA on 12/24/19 at 1:15 PM  Other Instructions

## 2019-10-23 LAB — PRO B NATRIURETIC PEPTIDE: NT-Pro BNP: 1020 pg/mL — ABNORMAL HIGH (ref 0–486)

## 2019-10-23 LAB — BASIC METABOLIC PANEL
BUN/Creatinine Ratio: 23 (ref 10–24)
BUN: 50 mg/dL — ABNORMAL HIGH (ref 8–27)
CO2: 22 mmol/L (ref 20–29)
Calcium: 9.5 mg/dL (ref 8.6–10.2)
Chloride: 108 mmol/L — ABNORMAL HIGH (ref 96–106)
Creatinine, Ser: 2.18 mg/dL — ABNORMAL HIGH (ref 0.76–1.27)
GFR calc Af Amer: 33 mL/min/{1.73_m2} — ABNORMAL LOW (ref >59)
GFR calc non Af Amer: 28 mL/min/{1.73_m2} — ABNORMAL LOW (ref >59)
Glucose: 119 mg/dL — ABNORMAL HIGH (ref 65–99)
Potassium: 5.4 mmol/L — ABNORMAL HIGH (ref 3.5–5.2)
Sodium: 144 mmol/L (ref 134–144)

## 2019-10-24 ENCOUNTER — Other Ambulatory Visit: Payer: Self-pay

## 2019-10-24 DIAGNOSIS — E875 Hyperkalemia: Secondary | ICD-10-CM

## 2019-10-24 MED ORDER — TORSEMIDE 20 MG PO TABS: ORAL_TABLET | ORAL | 3 refills | Status: DC

## 2019-10-24 NOTE — Progress Notes (Signed)
See lab results and recommendations.

## 2019-10-25 ENCOUNTER — Other Ambulatory Visit: Payer: Self-pay | Admitting: Physician Assistant

## 2019-10-25 DIAGNOSIS — R600 Localized edema: Secondary | ICD-10-CM

## 2019-10-27 ENCOUNTER — Other Ambulatory Visit: Payer: Self-pay

## 2019-10-27 ENCOUNTER — Encounter (INDEPENDENT_AMBULATORY_CARE_PROVIDER_SITE_OTHER): Payer: Self-pay

## 2019-10-27 ENCOUNTER — Telehealth: Payer: Self-pay | Admitting: *Deleted

## 2019-10-27 ENCOUNTER — Other Ambulatory Visit: Payer: Medicare Other | Admitting: *Deleted

## 2019-10-27 DIAGNOSIS — E875 Hyperkalemia: Secondary | ICD-10-CM

## 2019-10-27 LAB — BASIC METABOLIC PANEL
BUN/Creatinine Ratio: 24 (ref 10–24)
BUN: 54 mg/dL — ABNORMAL HIGH (ref 8–27)
CO2: 22 mmol/L (ref 20–29)
Calcium: 9 mg/dL (ref 8.6–10.2)
Chloride: 109 mmol/L — ABNORMAL HIGH (ref 96–106)
Creatinine, Ser: 2.24 mg/dL — ABNORMAL HIGH (ref 0.76–1.27)
GFR calc Af Amer: 32 mL/min/{1.73_m2} — ABNORMAL LOW (ref >59)
GFR calc non Af Amer: 27 mL/min/{1.73_m2} — ABNORMAL LOW (ref >59)
Glucose: 156 mg/dL — ABNORMAL HIGH (ref 65–99)
Potassium: 5.1 mmol/L (ref 3.5–5.2)
Sodium: 143 mmol/L (ref 134–144)

## 2019-10-27 NOTE — Telephone Encounter (Signed)
-----   Message from Imogene Burn, PA-C sent at 10/27/2019  4:23 PM EST ----- Potassium normal but creatinine continues to go up.  See if he can decrease the Demadex to 20 mg twice daily.  Ask him about his edema.  He is also supposed to see renal this month.  Can you ask him when his appointment is?

## 2019-10-27 NOTE — Telephone Encounter (Signed)
Per DPR it is OK to leave detailed message. I left message with instructions to decrease torsemide to 20 mg twice daily.  I asked pt to call office tomorrow to discuss swelling and timing of renal appointment.

## 2019-10-30 NOTE — Telephone Encounter (Signed)
Left message to call back  

## 2019-11-01 ENCOUNTER — Ambulatory Visit: Payer: Medicare Other | Attending: Internal Medicine

## 2019-11-01 DIAGNOSIS — Z23 Encounter for immunization: Secondary | ICD-10-CM | POA: Diagnosis not present

## 2019-11-01 NOTE — Progress Notes (Signed)
   Covid-19 Vaccination Clinic  Name:  Jake Gross    MRN: [id number] DOB: Apr 26, 1941  11/01/2019  Jake Gross was observed post Covid-19 immunization for 15 minutes without incidence. He was provided with Vaccine Information Sheet and instruction to access the V-Safe system.   Jake Gross was instructed to call 911 with any severe reactions post vaccine: Marland Kitchen Difficulty breathing  . Swelling of your face and throat  . A fast heartbeat  . A bad rash all over your body  . Dizziness and weakness    Immunizations Administered    Name Date Dose VIS Date Route   Pfizer COVID-19 Vaccine 11/01/2019 12:14 PM 0.3 mL 09/26/2019 Intramuscular   Manufacturer: [place]   Lot: [id number]   [place]: [address]-6

## 2019-11-06 DIAGNOSIS — R339 Retention of urine, unspecified: Secondary | ICD-10-CM | POA: Diagnosis not present

## 2019-11-06 DIAGNOSIS — N3944 Nocturnal enuresis: Secondary | ICD-10-CM | POA: Diagnosis not present

## 2019-11-10 ENCOUNTER — Ambulatory Visit (INDEPENDENT_AMBULATORY_CARE_PROVIDER_SITE_OTHER): Payer: Medicare Other | Admitting: *Deleted

## 2019-11-10 DIAGNOSIS — I639 Cerebral infarction, unspecified: Secondary | ICD-10-CM | POA: Diagnosis not present

## 2019-11-10 LAB — CUP PACEART REMOTE DEVICE CHECK
Date Time Interrogation Session: 20210124232238
Implantable Pulse Generator Implant Date: 20181030

## 2019-11-22 ENCOUNTER — Ambulatory Visit: Payer: Medicare Other | Attending: Internal Medicine

## 2019-11-22 DIAGNOSIS — Z23 Encounter for immunization: Secondary | ICD-10-CM | POA: Insufficient documentation

## 2019-11-22 NOTE — Progress Notes (Signed)
   Covid-19 Vaccination Clinic  Name:  Jake Gross    MRN: [id number] DOB: 15-Dec-1940  11/22/2019  Mr. Kading was observed post Covid-19 immunization for 15 minutes without incidence. He was provided with Vaccine Information Sheet and instruction to access the V-Safe system.   Mr. Kaluzny was instructed to call 911 with any severe reactions post vaccine: Marland Kitchen Difficulty breathing  . Swelling of your face and throat  . A fast heartbeat  . A bad rash all over your body  . Dizziness and weakness    Immunizations Administered    Name Date Dose VIS Date Route   Pfizer COVID-19 Vaccine 11/22/2019 11:54 AM 0.3 mL 09/26/2019 Intramuscular   Manufacturer: [place]   Lot: [id number]   [place]: [id number]

## 2019-11-24 MED ORDER — TORSEMIDE 20 MG PO TABS: 20.0000 mg | ORAL_TABLET | Freq: Two times a day (BID) | ORAL | 3 refills | Status: DC

## 2019-11-24 NOTE — Telephone Encounter (Signed)
Called and spoke to patient. He states that he has been out of town and that is why he did not return our calls. He states that he did decrease his torsemide to 20 mg BID. He states that his swelling is better but still has some in left foot. Denies, SOB, weight gain, or any other Sx. Instructed the patient to avoid salt and elevate legs and wear compression stockings to help with swelling. Patient states he is suppose to see the renal MD 3/8, but is not sure who he is seeing. Patient will let us know if his Sx change or worsen.

## 2019-11-27 DIAGNOSIS — R351 Nocturia: Secondary | ICD-10-CM | POA: Diagnosis not present

## 2019-11-27 DIAGNOSIS — R338 Other retention of urine: Secondary | ICD-10-CM | POA: Diagnosis not present

## 2019-11-27 DIAGNOSIS — N130 Hydronephrosis with ureteropelvic junction obstruction: Secondary | ICD-10-CM | POA: Diagnosis not present

## 2019-11-29 ENCOUNTER — Other Ambulatory Visit: Payer: Self-pay | Admitting: Physician Assistant

## 2019-11-30 ENCOUNTER — Other Ambulatory Visit: Payer: Self-pay | Admitting: Internal Medicine

## 2019-11-30 ENCOUNTER — Other Ambulatory Visit: Payer: Self-pay | Admitting: Physician Assistant

## 2019-11-30 DIAGNOSIS — R351 Nocturia: Secondary | ICD-10-CM

## 2019-11-30 DIAGNOSIS — N401 Enlarged prostate with lower urinary tract symptoms: Secondary | ICD-10-CM

## 2019-11-30 DIAGNOSIS — R600 Localized edema: Secondary | ICD-10-CM

## 2019-12-02 ENCOUNTER — Telehealth: Payer: Self-pay

## 2019-12-02 NOTE — Telephone Encounter (Signed)
[facility] alert received for AF episode, looks more like SR with PACs, 2 mins long on 2/15.  Pt with known history of PAF, on Metoprolol 37.5mg  BID.  Pt not currently on Torboy due to GI bleed 08/2019.    Spoke with pt, he confirmed he is taking Metoprolol and stated he has not been told to restart Eliquis.  He was asymptomatic at time of episode.    Advised would send to MD for review/ recommendations.

## 2019-12-04 NOTE — Telephone Encounter (Signed)
Yes.  Lets extend out to 30 minutes or so

## 2019-12-14 ENCOUNTER — Other Ambulatory Visit: Payer: Self-pay | Admitting: Internal Medicine

## 2019-12-15 ENCOUNTER — Telehealth: Payer: Self-pay | Admitting: Physician Assistant

## 2019-12-15 ENCOUNTER — Telehealth: Payer: Self-pay | Admitting: Internal Medicine

## 2019-12-15 ENCOUNTER — Ambulatory Visit (INDEPENDENT_AMBULATORY_CARE_PROVIDER_SITE_OTHER): Payer: Medicare Other | Admitting: *Deleted

## 2019-12-15 DIAGNOSIS — I639 Cerebral infarction, unspecified: Secondary | ICD-10-CM | POA: Diagnosis not present

## 2019-12-15 LAB — CUP PACEART REMOTE DEVICE CHECK
Date Time Interrogation Session: 20210228231418
Implantable Pulse Generator Implant Date: 20181030

## 2019-12-15 NOTE — Telephone Encounter (Signed)
Confirmed w/[facility] appt 12-22-19@9 :40 a.m.  Pt notified.

## 2019-12-15 NOTE — Telephone Encounter (Signed)
Called and spoke to patient. He states that he received a text message stating that he had an appointment on 3/8 at 9:40. He states that he thinks it is with the kidney doctor but does not know where and with who. [facility] and they have him down to see Dr. Matilde Sprang on 3/26 at 2:15 PM. Called and made patient aware and he states that he knew about that appointment. I tried calling several other offices with no luck. Patient then says that he thinks that the referral for the appt on 3/8 was sent by his PCP. Called patient's PCP and they said it was suppose to be with [facility]. Made her aware that I had called them earlier and they did not have him down for an appointment. She states that she will have the referral coordinator call the patient later today. Called and made patient aware and he verbalized understanding.

## 2019-12-15 NOTE — Telephone Encounter (Signed)
[place] @ [facility] called Pt called and spoke with her he stated he received a text reminding him of appointment 3/8 @ 9:40. [place] could not find out where pt is scheduled.  She wanted to see if you could help pt.  I looked in referral and it looks like pt has appointment 3/8 @ [facility] brittany called them and they did have pt scheduled .  The phone number the text was from was not a working number when he called back

## 2019-12-15 NOTE — Progress Notes (Signed)
ILR Remote 

## 2019-12-15 NOTE — Telephone Encounter (Signed)
Jake Gross is calling in regards to the Kidney Doctor he is supposed to begin seeing that he previously spoke with Estella Husk about. He states Sharyn Lull had wanted to know the Doctors name and he was calling to inform her he does not know the name or where the location even is, but he received a text message today stating he is scheduled for an appointment next Monday 12/22/19 at 9:40 AM.

## 2019-12-22 ENCOUNTER — Other Ambulatory Visit: Payer: Self-pay | Admitting: Nephrology

## 2019-12-22 DIAGNOSIS — E876 Hypokalemia: Secondary | ICD-10-CM

## 2019-12-22 DIAGNOSIS — N1832 Chronic kidney disease, stage 3b: Secondary | ICD-10-CM

## 2019-12-22 DIAGNOSIS — I129 Hypertensive chronic kidney disease with stage 1 through stage 4 chronic kidney disease, or unspecified chronic kidney disease: Secondary | ICD-10-CM | POA: Diagnosis not present

## 2019-12-22 DIAGNOSIS — E1122 Type 2 diabetes mellitus with diabetic chronic kidney disease: Secondary | ICD-10-CM | POA: Diagnosis not present

## 2019-12-22 DIAGNOSIS — R809 Proteinuria, unspecified: Secondary | ICD-10-CM | POA: Insufficient documentation

## 2019-12-22 DIAGNOSIS — N189 Chronic kidney disease, unspecified: Secondary | ICD-10-CM

## 2019-12-22 DIAGNOSIS — D631 Anemia in chronic kidney disease: Secondary | ICD-10-CM

## 2019-12-23 NOTE — Progress Notes (Signed)
Cardiology Office Note    Date:  12/24/2019   ID:  [employer], DOB 07/13/1941, MRN [id number]  PCP:  Jearld Fenton, NP  Cardiologist: Lauree Chandler, MD EPS: Thompson Grayer, MD  Chief Complaint  Patient presents with  . Follow-up    History of Present Illness:  Jake Gross is a 79 y.o. male with history of CAD status post CABG 11/2018 for severe left main disease, prior CVA in 2018, PAF on Eliquis, hypertension, hyperlipidemia, chronic diastolic CHF, DM, CKD stage III-IV.     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  GI said it was okay to restart Eliquis.  CHA2DS2-VASc equals 8 2D echo showed normal LV function with mild to moderate MR.  I have been seeing the patient frequently and adjusting his demadex up and down for CHF.  He was able to see renal doctors yesterday who were doing a full work-up.  Last office visit with me in 11/06/2019 blood pressure was up but was better at home.  Patient comes in for f/u. Some left lower leg swelling. Frustrated because he can't do what he used to like play golf. BP was high on Monday but good today Has been running high lately. Having renal US on Monday and had blood work by renal 2 days ago.      Past Medical History:  Diagnosis Date  . 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 Arthritis   . 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 CAD (coronary artery disease) 09/29/2019  . Cervical spondylosis   . Chronic anticoagulation   . Chronic diastolic CHF (congestive heart failure) ([place]) 12/17/2018  . CKD (chronic kidney disease) 12/17/2018  . Diabetes mellitus   . History of GI bleed  09/29/2019  . Hyperlipidemia   . Hypertension   . Iron deficiency anemia due to chronic blood loss   . Paroxysmal atrial fibrillation ([place]) 12/17/2018  . 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 Stroke (cerebrum) ([place]) 06/20/2017  . Vitamin D deficiency     Past Surgical History:  Procedure Laterality Date  . ABCESS DRAINAGE Left    buttocks  . CARDIOVASCULAR STRESS TEST  10/12/1999   EF 63%. NO ISCHEMIA  . COLONOSCOPY W/ POLYPECTOMY     5 polyps  . COLONOSCOPY WITH PROPOFOL N/A 08/29/2019   Procedure: COLONOSCOPY WITH PROPOFOL;  Surgeon: Doran Stabler, MD;  Location: [place];  Service: Gastroenterology;  Laterality: N/A;  . CORONARY ARTERY BYPASS GRAFT N/A 11/26/2018   Procedure: CORONARY ARTERY BYPASS GRAFTING (CABG), ON PUMP, TIMES FOUR, USING LEFT INTERNAL MAMMARY ARTERY AND ENDOSCOPICALLY HARVESTED LEFT SAPHENOUS VEIN;  Surgeon: Gaye [place], MD;  Location: [place] [facility];  Service: Open Heart Surgery;  Laterality: N/A;  . ESOPHAGOGASTRODUODENOSCOPY (EGD) WITH PROPOFOL N/A 08/29/2019   Procedure: ESOPHAGOGASTRODUODENOSCOPY (EGD) WITH PROPOFOL;  Surgeon: Doran Stabler, MD;  Location: [facility];  Service: Gastroenterology;  Laterality: N/A;  . EUS N/A 03/11/2015   Procedure: LOWER ENDOSCOPIC ULTRASOUND (EUS);  Surgeon: Milus Banister, MD;  Location: [facility];  Service: Endoscopy;  Laterality: N/A;  . FLEXIBLE SIGMOIDOSCOPY N/A 02/02/2015   Procedure: FLEXIBLE SIGMOIDOSCOPY;  Surgeon:  Inda Castle, MD;  Location: [facility];  Service: Endoscopy;  Laterality: N/A;  ERBE  . HEMOSTASIS CLIP PLACEMENT  08/29/2019   Procedure: HEMOSTASIS CLIP PLACEMENT;  Surgeon: Doran Stabler, MD;  Location: [place];  Service: Gastroenterology;;  . HOT HEMOSTASIS N/A 08/29/2019   Procedure: HOT HEMOSTASIS (ARGON PLASMA COAGULATION/BICAP);  Surgeon: Doran Stabler, MD;  Location: [facility];  Service: Gastroenterology;  Laterality: N/A;  . LEFT HEART CATH AND CORONARY ANGIOGRAPHY N/A  11/20/2018   Procedure: LEFT HEART CATH AND CORONARY ANGIOGRAPHY;  Surgeon: Burnell Blanks, MD;  Location: [facility];  Service: Cardiovascular;  Laterality: N/A;  . LOOP RECORDER INSERTION N/A 08/14/2017   Procedure: LOOP RECORDER INSERTION;  Surgeon: Thompson Grayer, MD;  Location: [facility];  Service: Cardiovascular;  Laterality: N/A;  . PARTIAL PROCTECTOMY BY TEM N/A 04/08/2015   Procedure: TEM PARTIAL PROCTECTOMY OF RECTAL MASS;  Surgeon: Michael Boston, MD;  Location: [facility];  Service: General;  Laterality: N/A;  . POLYPECTOMY  08/29/2019   Procedure: POLYPECTOMY;  Surgeon: Doran Stabler, MD;  Location: [facility];  Service: Gastroenterology;;  . TEE WITHOUT CARDIOVERSION N/A 06/25/2017   Procedure: TRANSESOPHAGEAL ECHOCARDIOGRAM (TEE);  Surgeon: Skeet Latch, MD;  Location: [facility];  Service: Cardiovascular;  Laterality: N/A;  . TEE WITHOUT CARDIOVERSION N/A 11/26/2018   Procedure: TRANSESOPHAGEAL ECHOCARDIOGRAM (TEE);  Surgeon: Gaye Pollack, MD;  Location: [facility];  Service: Open Heart Surgery;  Laterality: N/A;  . TONSILLECTOMY AND ADENOIDECTOMY     as child    Current Medications: Current Meds  Medication Sig  . amLODipine (NORVASC) 5 MG tablet TAKE 1 TABLET BY MOUTH EVERY DAY  . Cyanocobalamin 1000 MCG CAPS Take by mouth.  . fenofibrate micronized (LOFIBRA) 67 MG capsule TAKE 1 CAPSULE DAILY BEFORE BREAKFAST  . ferrous sulfate 325 (65 FE) MG tablet Take 1 tablet (325 mg total) by mouth daily.  . hydrALAZINE (APRESOLINE) 100 MG tablet Take 1 tablet (100 mg total) by mouth 3 (three) times daily.  . Insulin Glargine (LANTUS SOLOSTAR) 100 UNIT/ML Solostar Pen Inject 60 Units into the skin daily. And pen needles 1/day  . isosorbide mononitrate (IMDUR) 60 MG 24 hr tablet Take 1 tablet (60 mg total) by mouth daily.  . metoprolol tartrate (LOPRESSOR) 25 MG tablet Take 1.5 tablets (37.5 mg total) by mouth 2 (two) times daily.  . ONE TOUCH ULTRA TEST  test strip USE 1 STRIP TWICE A DAY  . ONETOUCH DELICA LANCETS 29J MISC   . pantoprazole (PROTONIX) 40 MG tablet Take 1 tablet (40 mg total) by mouth daily before breakfast.  . rosuvastatin (CRESTOR) 20 MG tablet Take 1 tablet (20 mg total) by mouth at bedtime. SCHEDULE PHYSICAL EXAM  . tamsulosin (FLOMAX) 0.4 MG CAPS capsule TAKE 1 CAPSULE BY MOUTH EVERY DAY  . torsemide (DEMADEX) 20 MG tablet Take 1 tablet (20 mg total) by mouth 2 (two) times daily.     Allergies:   Patient has no known allergies.   Social History   Socioeconomic History  . Marital status: Divorced    Spouse name: Not on file  . Number of children: 2  . Years of education: Not on file  . Highest education level: Not on file  Occupational History  . Occupation: retired    Fish farm manager: [employer]  Tobacco Use  . Smoking status: Former Smoker    Types: Cigarettes    Quit date: 08/13/1994    Years since quitting: 25.3  .  Smokeless tobacco: Never Used  Substance and Sexual Activity  . Alcohol use: No    Alcohol/week: 0.0 standard drinks  . Drug use: No  . Sexual activity: Not Currently    Birth control/protection: None  Other Topics Concern  . Not on file  Social History Narrative  . Not on file   Social Determinants of Health   Financial Resource Strain:   . Difficulty of Paying Living Expenses: Not on file  Food Insecurity:   . Worried About Charity fundraiser in the Last Year: Not on file  . Ran Out of Food in the Last Year: Not on file  Transportation Needs:   . Lack of Transportation (Medical): Not on file  . Lack of Transportation (Non-Medical): Not on file  Physical Activity:   . Days of Exercise per Week: Not on file  . Minutes of Exercise per Session: Not on file  Stress:   . Feeling of Stress : Not on file  Social Connections:   . Frequency of Communication with Friends and Family: Not on file  . Frequency of Social Gatherings with Friends and Family: Not on file  . Attends Religious Services: Not  on file  . Active Member of Clubs or Organizations: Not on file  . Attends Archivist Meetings: Not on file  . Marital Status: Not on file     Family History:  The patient's   family history includes Cancer in his brother and father; Coronary artery disease in his brother; Diabetes in his brother and mother; Stroke in his mother.   ROS:   Please see the history of present illness.    ROS All other systems reviewed and are negative.   PHYSICAL EXAM:   VS:  BP (!) 146/60   Pulse (!) 55   Ht 5\' 7"  (1.702 m)   Wt 217 lb (98.4 kg)   SpO2 96%   BMI 33.99 kg/m   Physical Exam  GEN: Well nourished, well developed, in no acute distress  Neck: no JVD, carotid bruits, or masses Cardiac:RRR; no murmurs, rubs, or gallops  Respiratory:  clear to auscultation bilaterally, normal work of breathing GI: soft, nontender, nondistended, + BS Ext: +1-2 edema left lower extremity, trace edema right lower extremity Neuro:  Alert and Oriented x 3 Psych: euthymic mood, full affect  Wt Readings from Last 3 Encounters:  12/24/19 217 lb (98.4 kg)  10/22/19 208 lb 6.4 oz (94.5 kg)  09/30/19 209 lb (94.8 kg)      Studies/Labs Reviewed:   EKG:  EKG is  ordered today.   Recent Labs: 08/27/2019: B Natriuretic Peptide 502.5; TSH 1.445 09/08/2019: ALT 7 09/26/2019: Hemoglobin 10.0; Platelets 229 10/22/2019: NT-Pro BNP 1,020 10/27/2019: BUN 54; Creatinine, Ser 2.24; Potassium 5.1; Sodium 143   Lipid Panel    Component Value Date/Time   CHOL 138 11/12/2018 1528   TRIG 214.0 (H) 11/12/2018 1528   HDL 25.60 (L) 11/12/2018 1528   CHOLHDL 5 11/12/2018 1528   VLDL 42.8 (H) 11/12/2018 1528   LDLCALC 56 06/22/2017 0516   LDLDIRECT 80.0 11/12/2018 1528    Additional studies/ records that were reviewed today include:  2D echo 11/13/2020IMPRESSIONS      1. Left ventricular ejection fraction, by visual estimation, is 55 to 60%. The left ventricle has normal function. There is mildly increased  left ventricular hypertrophy.  2. Abnormal septal motion consistent with post-operative status.  3. Elevated left atrial pressure.  4. Left ventricular diastolic parameters are consistent  with Grade II diastolic dysfunction (pseudonormalization).  5. The left ventricle has no regional wall motion abnormalities.  6. Global right ventricle has mildly reduced systolic function.The right ventricular size is normal. No increase in right ventricular wall thickness.  7. Left atrial size was severely dilated.  8. Right atrial size was normal.  9. The mitral valve is normal in structure. Mild to moderate mitral valve regurgitation. 10. The tricuspid valve is normal in structure. Tricuspid valve regurgitation is not demonstrated. 11. The aortic valve is normal in structure. Aortic valve regurgitation is not visualized. 12. The pulmonic valve was grossly normal. Pulmonic valve regurgitation is not visualized. 13. TR signal is inadequate for assessing pulmonary artery systolic pressure.   Left Heart Catheterization 11/20/2018:  Prox RCA to Mid RCA lesion is 30% stenosed.  RPDA lesion is 99% stenosed.  Post Atrio lesion is 100% stenosed.  Ost 2nd Diag to 2nd Diag lesion is 99% stenosed.  Prox LAD lesion is 95% stenosed.  Ost 1st Diag lesion is 80% stenosed.  Ost Cx to Prox Cx lesion is 70% stenosed.  Ost LM lesion is 70% stenosed.   1. Moderately severe ostial left main stenosis.  2. Severe stenosis in the heavily calcified mid LAD 3. Moderately severe proximal Circumflex stenosis 4. The RCA has mild calcific disease through the proximal and mid segments. The PDA is a moderate caliber vessel with severe stenosis. 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  5. Elevated LVEDP   Recommendations: He has severe left main and three vessel CAD. I will ask CT surgery to see him to discuss CABG. Given chronic kidney disease, I will admit him for  hydration. Will hold Plavix. He will need Plavix washout prior to surgery. Will arrange echo tomorrow to assess LVEF. Will start ASA. Continue beta blocker and statin. Given elevated filling pressures, one dose of IV Lasix tonight.    Echocardiogram 11/21/2018: Impressions: 1. The left ventricle has normal systolic function of 54-27%. The cavity size was mildly increased. There is no increased left ventricular wall thickness. Echo evidence of impaired diastolic relaxation.  2. The right ventricle has normal systolic function. The cavity was normal. There is no increase in right ventricular wall thickness.  3. Right atrial size was mildly dilated.  4. The mitral valve is normal in structure. There is mild to moderate mitral annular calcification present. No evidence of mitral valve stenosis. No mitral regurgitation.  5. The tricuspid valve is normal in structure.  6. The aortic valve is tricuspid There is mild calcification of the aortic valve. No aortic stenosis.  7. There is mild dilatation of the aortic root and ascending aorta.  8. No evidence of left ventricular regional wall motion abnormalities.  9. No complete TR doppler jet so unable to estimate PA systolic pressure.                 ASSESSMENT:    1. Chronic diastolic CHF (congestive heart failure) ([place])   2. History of GI bleed   3. Coronary artery disease involving coronary bypass graft of native heart without angina pectoris   4. Paroxysmal atrial fibrillation ([facility])   5. Essential hypertension   6. Stage 3 chronic kidney disease, unspecified whether stage 3a or 3b CKD   7. Hyperlipidemia, unspecified hyperlipidemia type   8. History of CVA (cerebrovascular accident)      PLAN:  In order of problems listed above:   Chronic diastolic CHF normal LVEF on  echo 08/29/2019 with mild to moderate MR. Had some primarily left lower extremity edema and weight up 7 pounds from last office visit.  I told him he can take extra  torsemide today and tomorrow and try to weigh himself daily.  Will not check labs today as renal just through labs on Monday.  Follow-up with Dr. Angelena Form in 6 weeks   GI bleed 08/2019 treated with 3 transfusions GI said it was okay to restart Eliquis.  He has had no further bleeding.    CAD status post CABG 11/2018 for left main disease.  He has had trouble recovering from it.  No chest pain   PAF on Eliquis CHA2DS2-VASc equals 8 no bleeding problems   Essential hypertension  blood pressure stable today but was high on Monday and has been running high in the 768 systolic at home.  Says he is not getting extra salt in his diet.  He does have some edema today in his left leg.  Having renal ultrasound by nephrologist on Monday.  We will hold off changing any medications until he has full work-up.   CKD stage III-IV creatinine 2.24 11121 but saw renal yesterday and full workup being done   Hyperlipidemia LDL 81 11/12/18 will need to repeat hold off for now as he just had blood work drawn on Monday.   History of cryptogenic stroke in 2018.          Medication Adjustments/Labs and Tests Ordered: Current medicines are reviewed at length with the patient today.  Concerns regarding medicines are outlined above.  Medication changes, Labs and Tests ordered today are listed in the Patient Instructions below. Patient Instructions  Medication Instructions:  Your physician has recommended you make the following change in your medication:   Take an extra 20 mg of torsemide today and tomorrow   *If you need a refill on your cardiac medications before your next appointment, please call your pharmacy*   Lab Work: None ordered If you have labs (blood work) drawn today and your tests are completely normal, you will receive your results only by: Marland Kitchen MyChart Message (if you have MyChart) OR . A paper copy in the mail If you have any lab test that is abnormal or we need to change your treatment, we will  call you to review the results.   Testing/Procedures: None ordered   Follow-Up:  Follow up with Dr. Angelena Form on 02/05/20 at 3:20 PM  Other Instructions  Your provider recommends that you maintain 150 minutes per week of moderate aerobic activity.     Sumner Boast, PA-C  12/24/2019 2:09 PM    [facility] [address], [place], [place]  [address] Phone: [phone]; Fax: [phone]

## 2019-12-24 ENCOUNTER — Ambulatory Visit (INDEPENDENT_AMBULATORY_CARE_PROVIDER_SITE_OTHER): Payer: Medicare Other | Admitting: Physician Assistant

## 2019-12-24 ENCOUNTER — Other Ambulatory Visit: Payer: Self-pay

## 2019-12-24 ENCOUNTER — Encounter: Payer: Self-pay | Admitting: Physician Assistant

## 2019-12-24 VITALS — BP 146/60 | HR 55 | Ht 67.0 in | Wt 217.0 lb

## 2019-12-24 DIAGNOSIS — E785 Hyperlipidemia, unspecified: Secondary | ICD-10-CM

## 2019-12-24 DIAGNOSIS — I480 Paroxysmal atrial fibrillation: Secondary | ICD-10-CM

## 2019-12-24 DIAGNOSIS — I25810 Atherosclerosis of coronary artery bypass graft(s) without angina pectoris: Secondary | ICD-10-CM

## 2019-12-24 DIAGNOSIS — Z8719 Personal history of other diseases of the digestive system: Secondary | ICD-10-CM | POA: Diagnosis not present

## 2019-12-24 DIAGNOSIS — N183 Chronic kidney disease, stage 3 unspecified: Secondary | ICD-10-CM | POA: Diagnosis not present

## 2019-12-24 DIAGNOSIS — I5032 Chronic diastolic (congestive) heart failure: Secondary | ICD-10-CM | POA: Diagnosis not present

## 2019-12-24 DIAGNOSIS — Z8673 Personal history of transient ischemic attack (TIA), and cerebral infarction without residual deficits: Secondary | ICD-10-CM | POA: Diagnosis not present

## 2019-12-24 DIAGNOSIS — I10 Essential (primary) hypertension: Secondary | ICD-10-CM | POA: Diagnosis not present

## 2019-12-24 NOTE — Patient Instructions (Addendum)
Medication Instructions:  Your physician has recommended you make the following change in your medication:   Take an extra 20 mg of torsemide today and tomorrow   *If you need a refill on your cardiac medications before your next appointment, please call your pharmacy*   Lab Work: None ordered If you have labs (blood work) drawn today and your tests are completely normal, you will receive your results only by: Marland Kitchen MyChart Message (if you have MyChart) OR . A paper copy in the mail If you have any lab test that is abnormal or we need to change your treatment, we will call you to review the results.   Testing/Procedures: None ordered   Follow-Up:  Follow up with Dr. Angelena Form on 02/05/20 at 3:20 PM  Other Instructions  Your provider recommends that you maintain 150 minutes per week of moderate aerobic activity.

## 2019-12-29 ENCOUNTER — Ambulatory Visit
Admission: RE | Admit: 2019-12-29 | Discharge: 2019-12-29 | Disposition: A | Discharge status: Home / Self Care | Payer: Medicare Other | Source: Ambulatory Visit | Attending: Nephrology | Admitting: Nephrology

## 2019-12-29 ENCOUNTER — Other Ambulatory Visit: Payer: Self-pay

## 2019-12-29 DIAGNOSIS — N1832 Chronic kidney disease, stage 3b: Secondary | ICD-10-CM | POA: Diagnosis not present

## 2019-12-29 DIAGNOSIS — I129 Hypertensive chronic kidney disease with stage 1 through stage 4 chronic kidney disease, or unspecified chronic kidney disease: Secondary | ICD-10-CM

## 2019-12-29 DIAGNOSIS — N189 Chronic kidney disease, unspecified: Secondary | ICD-10-CM | POA: Diagnosis not present

## 2019-12-29 DIAGNOSIS — E876 Hypokalemia: Secondary | ICD-10-CM | POA: Diagnosis not present

## 2019-12-29 DIAGNOSIS — N183 Chronic kidney disease, stage 3 unspecified: Secondary | ICD-10-CM | POA: Diagnosis not present

## 2019-12-29 DIAGNOSIS — D631 Anemia in chronic kidney disease: Secondary | ICD-10-CM | POA: Diagnosis not present

## 2020-01-09 DIAGNOSIS — R338 Other retention of urine: Secondary | ICD-10-CM | POA: Diagnosis not present

## 2020-01-09 DIAGNOSIS — N130 Hydronephrosis with ureteropelvic junction obstruction: Secondary | ICD-10-CM | POA: Diagnosis not present

## 2020-01-19 ENCOUNTER — Ambulatory Visit (INDEPENDENT_AMBULATORY_CARE_PROVIDER_SITE_OTHER): Payer: Medicare Other | Admitting: *Deleted

## 2020-01-19 DIAGNOSIS — I639 Cerebral infarction, unspecified: Secondary | ICD-10-CM

## 2020-01-20 LAB — CUP PACEART REMOTE DEVICE CHECK
Date Time Interrogation Session: 20210401001913
Implantable Pulse Generator Implant Date: 20181030

## 2020-01-20 NOTE — Progress Notes (Signed)
ILR Remote 

## 2020-02-01 ENCOUNTER — Other Ambulatory Visit: Payer: Self-pay | Admitting: Internal Medicine

## 2020-02-05 ENCOUNTER — Ambulatory Visit (INDEPENDENT_AMBULATORY_CARE_PROVIDER_SITE_OTHER): Payer: Medicare Other | Admitting: Cardiovascular Disease

## 2020-02-05 ENCOUNTER — Other Ambulatory Visit: Payer: Self-pay

## 2020-02-05 ENCOUNTER — Encounter: Payer: Self-pay | Admitting: Cardiovascular Disease

## 2020-02-05 VITALS — BP 144/66 | HR 54 | Ht 67.0 in | Wt 220.0 lb

## 2020-02-05 DIAGNOSIS — I25810 Atherosclerosis of coronary artery bypass graft(s) without angina pectoris: Secondary | ICD-10-CM

## 2020-02-05 DIAGNOSIS — I5032 Chronic diastolic (congestive) heart failure: Secondary | ICD-10-CM | POA: Diagnosis not present

## 2020-02-05 DIAGNOSIS — I480 Paroxysmal atrial fibrillation: Secondary | ICD-10-CM

## 2020-02-05 DIAGNOSIS — I10 Essential (primary) hypertension: Secondary | ICD-10-CM | POA: Diagnosis not present

## 2020-02-05 MED ORDER — TORSEMIDE 20 MG PO TABS: 20.0000 mg | ORAL_TABLET | Freq: Two times a day (BID) | ORAL | 3 refills | Status: DC

## 2020-02-05 NOTE — Patient Instructions (Signed)
Medication Instructions:  Take one extra Torsemide every MON, WED, FRI  *If you need a refill on your cardiac medications before your next appointment, please call your pharmacy*   Lab Work: none If you have labs (blood work) drawn today and your tests are completely normal, you will receive your results only by: Marland Kitchen MyChart Message (if you have MyChart) OR . A paper copy in the mail If you have any lab test that is abnormal or we need to change your treatment, we will call you to review the results.   Testing/Procedures: none   Follow-Up: At [facility], you and your health needs are our priority.  As part of our continuing mission to provide you with exceptional heart care, we have created designated Provider Care Teams.  These Care Teams include your primary Cardiologist (physician) and Advanced Practice Providers (APPs -  Physician Assistants and Nurse Practitioners) who all work together to provide you with the care you need, when you need it.  We recommend signing up for the patient portal called "MyChart".  Sign up information is provided on this After Visit Summary.  MyChart is used to connect with patients for Virtual Visits (Telemedicine).  Patients are able to view lab/test results, encounter notes, upcoming appointments, etc.  Non-urgent messages can be sent to your provider as well.   To learn more about what you can do with MyChart, go to [email or web address].    Your next appointment:   6 month(s)  The format for your next appointment:   Either In Person or Virtual  Provider:   You may see Ermalinda Barrios, PA-C,one of the Advanced Practice Providers on your designated Care Team      Other Instructions

## 2020-02-05 NOTE — Progress Notes (Signed)
Chief Complaint  Patient presents with  . Follow-up    HTN   History of Present Illness: 79 yo male with history of CAD s/p CABG in February 2020, HTN, HLD, CKD, prior CVA and paroxysmal atrial fibrillation who is here today for cardiac follow up. His atrial fibrillation is followed by Dr. Rayann Heman. He is on Eliqus. He was found to have severe left main and three vessel CAD in February 2020 and underwent 4V CABG in February 2020. Echo February 2020 with LVEF=60-65%. No significant valve disease. 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 GI said it was okay to restart Eliquis. CHA2DS2-VASc equals 8 2D echo showed normal LV function with mild to moderate MR. He has been on Demadex. 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   He is here today for follow up. The patient denies any chest pain, dyspnea, palpitations, lower extremity edema, orthopnea, PND, dizziness, near syncope or syncope.   Primary Care Physician: Jearld Fenton, NP  Past Medical History:  Diagnosis Date  . 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 Arthritis   . 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 CAD (coronary artery disease) 09/29/2019  . Cervical spondylosis   . Chronic anticoagulation   . Chronic diastolic CHF (congestive heart failure) ([place]) 12/17/2018  . CKD (chronic kidney disease) 12/17/2018  . Diabetes mellitus   . History of GI bleed 09/29/2019  . Hyperlipidemia   . Hypertension   . Iron deficiency anemia due to chronic blood loss   . Paroxysmal atrial fibrillation ([place]) 12/17/2018  . 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 Stroke (cerebrum) ([place]) 06/20/2017  . Vitamin D  deficiency     Past Surgical History:  Procedure Laterality Date  . ABCESS DRAINAGE Left    buttocks  . CARDIOVASCULAR STRESS TEST  10/12/1999   EF 63%. NO ISCHEMIA  . COLONOSCOPY W/ POLYPECTOMY     5 polyps  . COLONOSCOPY WITH PROPOFOL N/A 08/29/2019   Procedure: COLONOSCOPY WITH PROPOFOL;  Surgeon: Doran Stabler, MD;  Location: [place];  Service: Gastroenterology;  Laterality: N/A;  . CORONARY ARTERY BYPASS GRAFT N/A 11/26/2018   Procedure: CORONARY ARTERY BYPASS GRAFTING (CABG), ON PUMP, TIMES FOUR, USING LEFT INTERNAL MAMMARY ARTERY AND ENDOSCOPICALLY HARVESTED LEFT SAPHENOUS VEIN;  Surgeon: Gaye Pollack, MD;  Location: [place];  Service: Open Heart Surgery;  Laterality: N/A;  . ESOPHAGOGASTRODUODENOSCOPY (EGD) WITH PROPOFOL N/A 08/29/2019   Procedure: ESOPHAGOGASTRODUODENOSCOPY (EGD) WITH PROPOFOL;  Surgeon: Doran Stabler, MD;  Location: [place];  Service: Gastroenterology;  Laterality: N/A;  . EUS N/A 03/11/2015   Procedure: LOWER ENDOSCOPIC ULTRASOUND (EUS);  Surgeon: Milus Banister, MD;  Location: [facility];  Service: Endoscopy;  Laterality: N/A;  . FLEXIBLE SIGMOIDOSCOPY N/A 02/02/2015   Procedure: FLEXIBLE SIGMOIDOSCOPY;  Surgeon: Inda Castle, MD;  Location: [facility];  Service: Endoscopy;  Laterality: N/A;  ERBE  . HEMOSTASIS CLIP PLACEMENT  08/29/2019   Procedure: HEMOSTASIS CLIP PLACEMENT;  Surgeon: Doran Stabler, MD;  Location: [place];  Service: Gastroenterology;;  . HOT HEMOSTASIS N/A 08/29/2019   Procedure: HOT HEMOSTASIS (ARGON PLASMA COAGULATION/BICAP);  Surgeon: Loletha Carrow,  Kirke Corin, MD;  Location: [place];  Service: Gastroenterology;  Laterality: N/A;  . LEFT HEART CATH AND CORONARY ANGIOGRAPHY N/A 11/20/2018   Procedure: LEFT HEART CATH AND CORONARY ANGIOGRAPHY;  Surgeon: Burnell Blanks, MD;  Location: [place] [facility];  Service: Cardiovascular;  Laterality: N/A;  . LOOP RECORDER INSERTION N/A 08/14/2017   Procedure:  LOOP RECORDER INSERTION;  Surgeon: Thompson Grayer, MD;  Location: [place] [facility];  Service: Cardiovascular;  Laterality: N/A;  . PARTIAL PROCTECTOMY BY TEM N/A 04/08/2015   Procedure: TEM PARTIAL PROCTECTOMY OF RECTAL MASS;  Surgeon: Michael Boston, MD;  [facility]: [facility];  Service: General;  Laterality: N/A;  . POLYPECTOMY  08/29/2019   Procedure: POLYPECTOMY;  Surgeon: Doran Stabler, MD;  Location: [facility];  Service: Gastroenterology;;  . TEE WITHOUT CARDIOVERSION N/A 06/25/2017   Procedure: TRANSESOPHAGEAL ECHOCARDIOGRAM (TEE);  Surgeon: Skeet Latch, MD;  Location: [place];  Service: Cardiovascular;  Laterality: N/A;  . TEE WITHOUT CARDIOVERSION N/A 11/26/2018   Procedure: TRANSESOPHAGEAL ECHOCARDIOGRAM (TEE);  Surgeon: Gaye Pollack, MD;  [facility] [place];  Service: Open Heart Surgery;  Laterality: N/A;  . TONSILLECTOMY AND ADENOIDECTOMY     as child    Current Outpatient Medications  Medication Sig Dispense Refill  . amLODipine (NORVASC) 5 MG tablet TAKE 1 TABLET BY MOUTH EVERY DAY 90 tablet 3  . apixaban (ELIQUIS) 5 MG TABS tablet Take 5 mg by mouth 2 (two) times daily.    . Cyanocobalamin 1000 MCG CAPS Take by mouth.    . fenofibrate micronized (LOFIBRA) 67 MG capsule Take 1 capsule (67 mg total) by mouth daily before breakfast. SCHEDULE PHYSICAL 90 capsule 0  . hydrALAZINE (APRESOLINE) 100 MG tablet Take 1 tablet (100 mg total) by mouth 3 (three) times daily. 270 tablet 3  . Insulin Glargine (LANTUS SOLOSTAR) 100 UNIT/ML Solostar Pen Inject 60 Units into the skin daily. And pen needles 1/day 30 mL 11  . isosorbide mononitrate (IMDUR) 60 MG 24 hr tablet Take 1 tablet (60 mg total) by mouth daily. 90 tablet 3  . metoprolol tartrate (LOPRESSOR) 25 MG tablet Take 1.5 tablets (37.5 mg total) by mouth 2 (two) times daily. 270 tablet 3  . ONE TOUCH ULTRA TEST test strip USE 1 STRIP TWICE A DAY 200 each 3  . ONETOUCH DELICA LANCETS 61P MISC     . rosuvastatin (CRESTOR) 20  MG tablet Take 1 tablet (20 mg total) by mouth at bedtime. SCHEDULE PHYSICAL EXAM 90 tablet 0  . tamsulosin (FLOMAX) 0.4 MG CAPS capsule TAKE 1 CAPSULE BY MOUTH EVERY DAY 90 capsule 1  . torsemide (DEMADEX) 20 MG tablet Take 1 tablet (20 mg total) by mouth 2 (two) times daily. Take one extra tablet every Mon, Wed, Fri 195 tablet 3  . ferrous sulfate 325 (65 FE) MG tablet Take 1 tablet (325 mg total) by mouth daily. 30 tablet 0  . pantoprazole (PROTONIX) 40 MG tablet Take 1 tablet (40 mg total) by mouth daily before breakfast. 30 tablet 0   No current facility-administered medications for this visit.    No Known Allergies  Social History   Socioeconomic History  . Marital status: Divorced    Spouse name: Not on file  . Number of children: 2  . Years of education: Not on file  . Highest education level: Not on file  Occupational History  . Occupation: retired    Fish farm manager: [employer]  Tobacco Use  . Smoking status: Former Smoker  Types: Cigarettes    Quit date: 08/13/1994    Years since quitting: 25.4  . Smokeless tobacco: Never Used  Substance and Sexual Activity  . Alcohol use: No    Alcohol/week: 0.0 standard drinks  . Drug use: No  . Sexual activity: Not Currently    Birth control/protection: None  Other Topics Concern  . Not on file  Social History Narrative  . Not on file   Social Determinants of Health   Financial Resource Strain:   . Difficulty of Paying Living Expenses:   Food Insecurity:   . Worried About Charity fundraiser in the Last Year:   . Arboriculturist in the Last Year:   Transportation Needs:   . Film/video editor (Medical):   Marland Kitchen Lack of Transportation (Non-Medical):   Physical Activity:   . Days of Exercise per Week:   . Minutes of Exercise per Session:   Stress:   . Feeling of Stress :   Social Connections:   . Frequency of Communication with Friends and Family:   . Frequency of Social Gatherings with Friends and Family:   . Attends  Religious Services:   . Active Member of Clubs or Organizations:   . Attends Archivist Meetings:   Marland Kitchen Marital Status:   Intimate Partner Violence:   . Fear of Current or Ex-Partner:   . Emotionally Abused:   Marland Kitchen Physically Abused:   . Sexually Abused:     Family History  Problem Relation Age of Onset  . Cancer Father        "all over"  . Coronary artery disease Brother   . Diabetes Brother   . Stroke Mother   . Diabetes Mother   . Cancer Brother   . Colon cancer Neg Hx   . Esophageal cancer Neg Hx   . Rectal cancer Neg Hx   . Stomach cancer Neg Hx     Review of Systems:  As stated in the HPI and otherwise negative.   BP (!) 144/66   Pulse (!) 54   Ht 5\' 7"  (1.702 m)   Wt 220 lb (99.8 kg)   SpO2 96%   BMI 34.46 kg/m   Physical Examination: General: Well developed, well nourished, NAD  HEENT: OP clear, mucus membranes moist  SKIN: warm, dry. No rashes. Neuro: No focal deficits  Musculoskeletal: Muscle strength 5/5 all ext  Psychiatric: Mood and affect normal  Neck: No JVD, no carotid bruits, no thyromegaly, no lymphadenopathy.  Lungs:Clear bilaterally, no wheezes, rhonci, crackles Cardiovascular: Regular rate and rhythm. No murmurs, gallops or rubs. Abdomen:Soft. Bowel sounds present. Non-tender.  Extremities: No lower extremity edema. Pulses are 2 + in the bilateral DP/PT.  EKG:  EKG is not ordered today. The ekg ordered today demonstrates   Recent Labs: 08/27/2019: B Natriuretic Peptide 502.5; TSH 1.445 09/08/2019: ALT 7 09/26/2019: Hemoglobin 10.0; Platelets 229 10/22/2019: NT-Pro BNP 1,020 10/27/2019: BUN 54; Creatinine, Ser 2.24; Potassium 5.1; Sodium 143   Lipid Panel    Component Value Date/Time   CHOL 138 11/12/2018 1528   TRIG 214.0 (H) 11/12/2018 1528   HDL 25.60 (L) 11/12/2018 1528   CHOLHDL 5 11/12/2018 1528   VLDL 42.8 (H) 11/12/2018 1528   LDLCALC 56 06/22/2017 0516   LDLDIRECT 80.0 11/12/2018 1528     Wt Readings from Last 3  Encounters:  02/05/20 220 lb (99.8 kg)  12/24/19 217 lb (98.4 kg)  10/22/19 208 lb 6.4 oz (94.5 kg)  Assessment and Plan:   1. CAD s/p CABG without angina: No chest pain. Continue ASA, statin, beta blocker and Imdur. .   2. HTN: BP is near goal. Continue current therapy.    3. Paroxysmal atrial fibrillation: Followed in our EP clinic by Dr. Rayann Heman. Sinus today on exam.Continue Eliquis and beta blocker.    4. Chronic diastolic CHF: Weight is up 3 lbs over the past week. His LE edema comes and goes in the left leg. Continue Demadex. He will take an extra 20 mg of Demadex on M,W,F.   Current medicines are reviewed at length with the patient today.  The patient does not have concerns regarding medicines.  The following changes have been made:  no change  Labs/ tests ordered today include:   No orders of the defined types were placed in this encounter.    Disposition:   FU with Ermalinda Barrios in 6 months.     Signed, Lauree Chandler, MD 02/05/2020 3:44 PM    [facility] [place], [place], [place]  [address] Phone: [phone]; Fax: [phone]

## 2020-02-19 LAB — CUP PACEART REMOTE DEVICE CHECK
Date Time Interrogation Session: 20210502002802
Implantable Pulse Generator Implant Date: 20181030

## 2020-02-23 ENCOUNTER — Ambulatory Visit (INDEPENDENT_AMBULATORY_CARE_PROVIDER_SITE_OTHER): Payer: Medicare Other | Admitting: *Deleted

## 2020-02-23 DIAGNOSIS — I639 Cerebral infarction, unspecified: Secondary | ICD-10-CM

## 2020-02-23 NOTE — Progress Notes (Signed)
Carelink Summary Report / Loop Recorder 

## 2020-03-06 ENCOUNTER — Other Ambulatory Visit: Payer: Self-pay | Admitting: Internal Medicine

## 2020-03-29 ENCOUNTER — Ambulatory Visit (INDEPENDENT_AMBULATORY_CARE_PROVIDER_SITE_OTHER): Payer: Medicare Other | Admitting: *Deleted

## 2020-03-29 DIAGNOSIS — I639 Cerebral infarction, unspecified: Secondary | ICD-10-CM

## 2020-03-29 LAB — CUP PACEART REMOTE DEVICE CHECK
Date Time Interrogation Session: 20210613234044
Implantable Pulse Generator Implant Date: 20181030

## 2020-03-30 NOTE — Progress Notes (Signed)
Carelink Summary Report / Loop Recorder 

## 2020-04-05 ENCOUNTER — Telehealth: Payer: Self-pay | Admitting: Internal Medicine

## 2020-04-05 NOTE — Telephone Encounter (Signed)
[employer] called today to follow up on paperwork they faxed over on 6/18 They stated this was for a cardiovascular risk assessment that the patient requested.  They are needing a signature from the provider and have it faxed back to them    PHONE- [phone]

## 2020-04-13 NOTE — Telephone Encounter (Signed)
I still have not received anything for patient

## 2020-04-20 NOTE — Telephone Encounter (Signed)
Tiffany at [facility] left a message wanting to know if you ever received the paperwork from them? Tiffany requested a call back to let her know if you received this.  [phone] ext [phone]

## 2020-04-28 ENCOUNTER — Other Ambulatory Visit: Payer: Self-pay | Admitting: Internal Medicine

## 2020-05-01 IMAGING — US US RENAL
1 series · 14 of 25 positions shown · non-contrast
Comparison: CT 08/27/2019

CLINICAL DATA: Chronic renal failure, CKD stage III with anemia,
hypokalemia and hypertensive renal disease.

EXAM:
RENAL / URINARY TRACT ULTRASOUND COMPLETE

[Series 1: us renal · 14 of 29 slices shown]
[im 1/29]
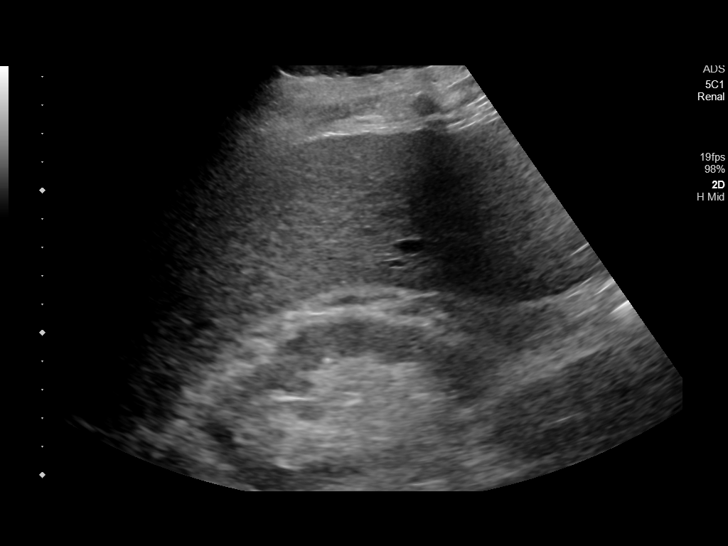
[im 3/29]
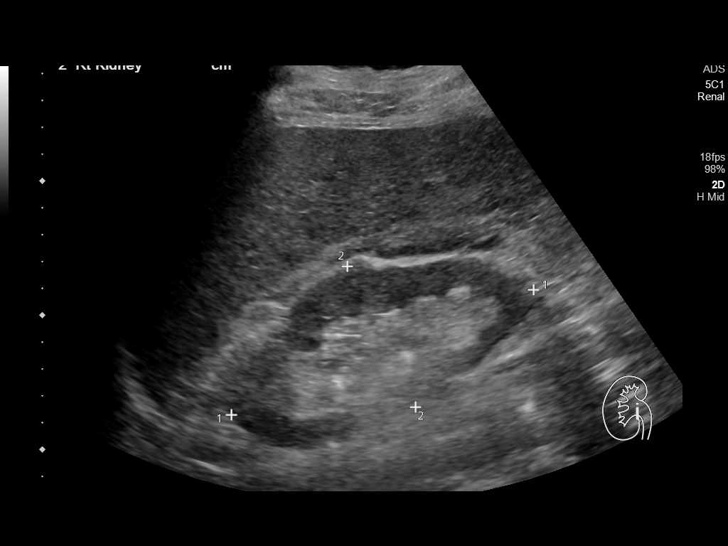
[im 5/29]
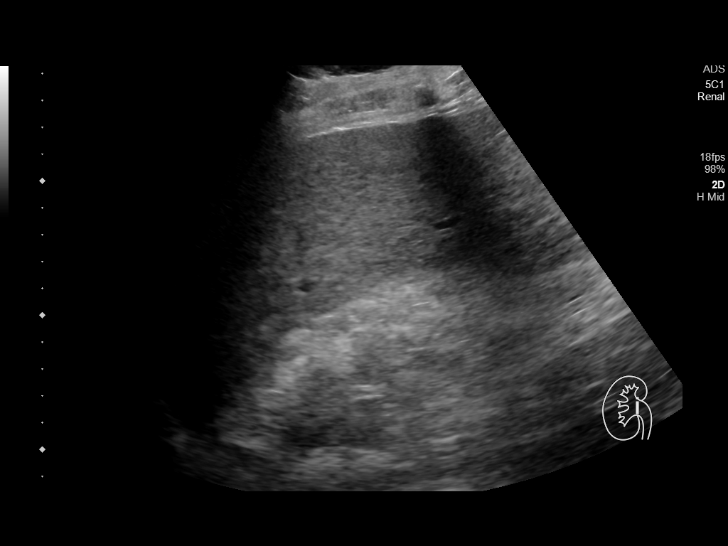
[im 8/29]
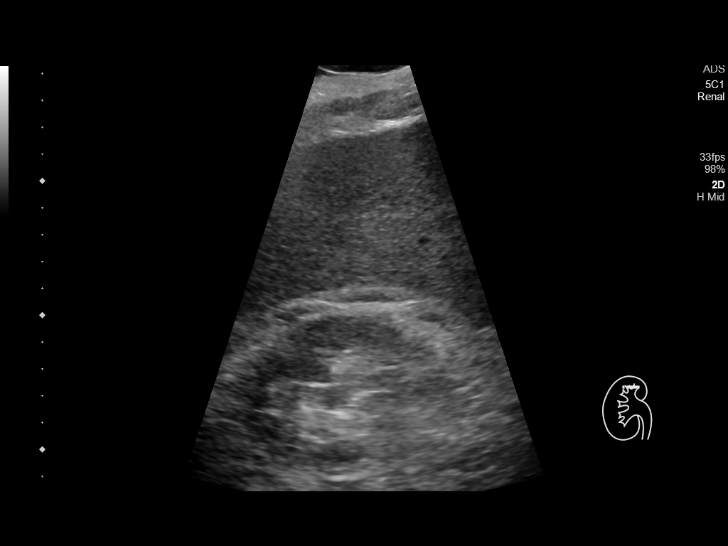
[im 10/29]
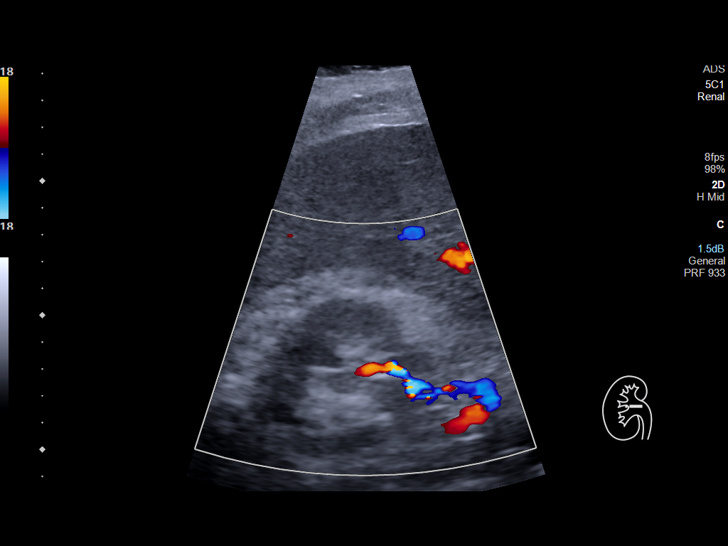
[im 11/29]
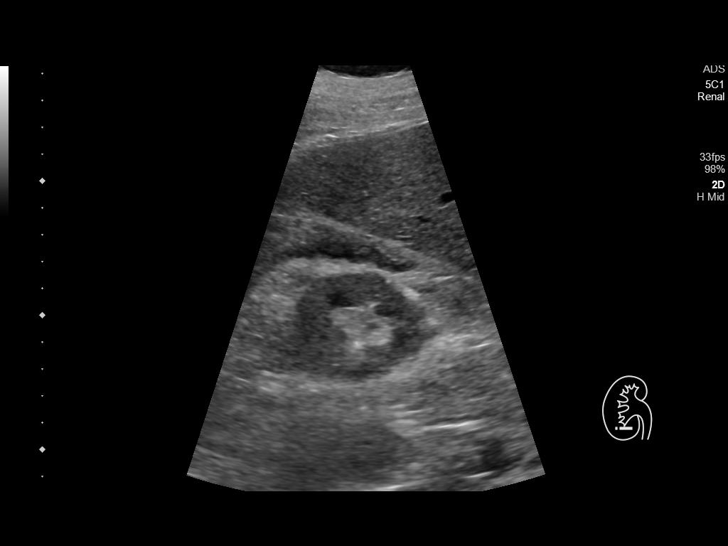
[im 13/29]
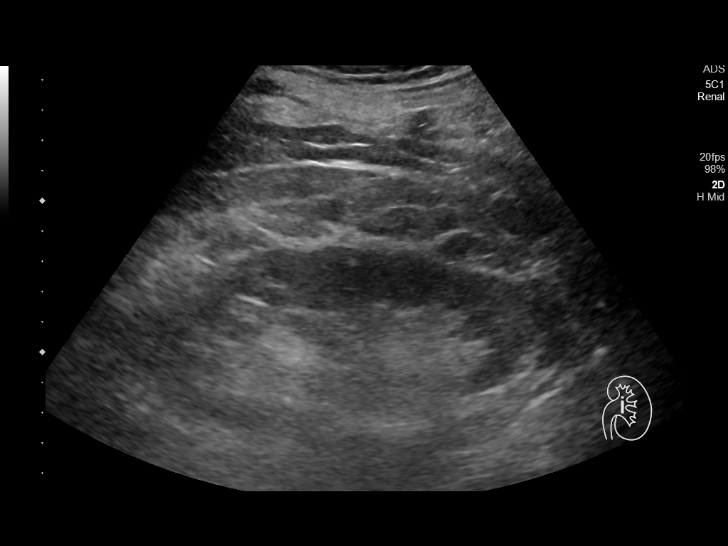
[im 16/29]
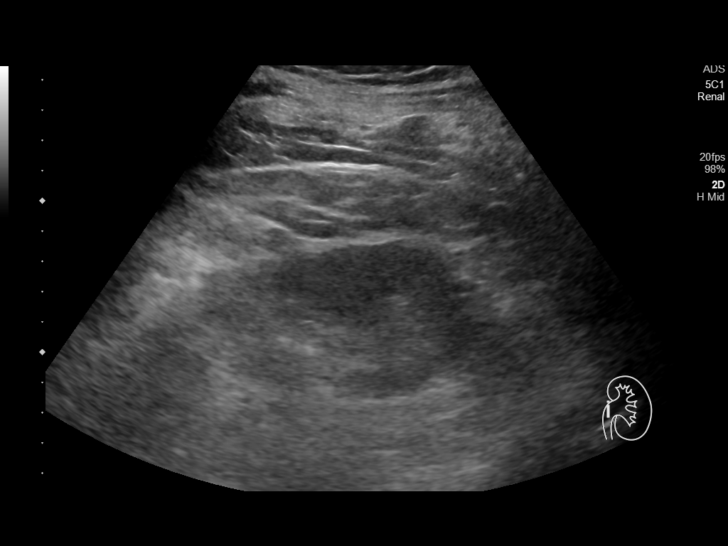
[im 18/29]
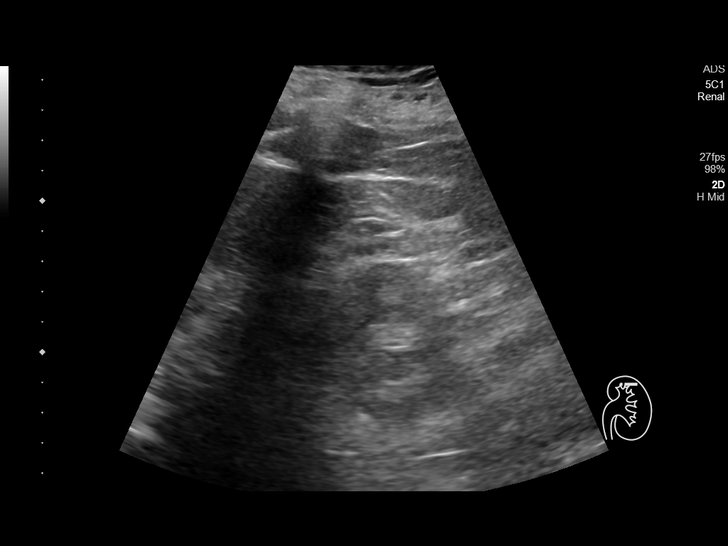
[im 19/29]
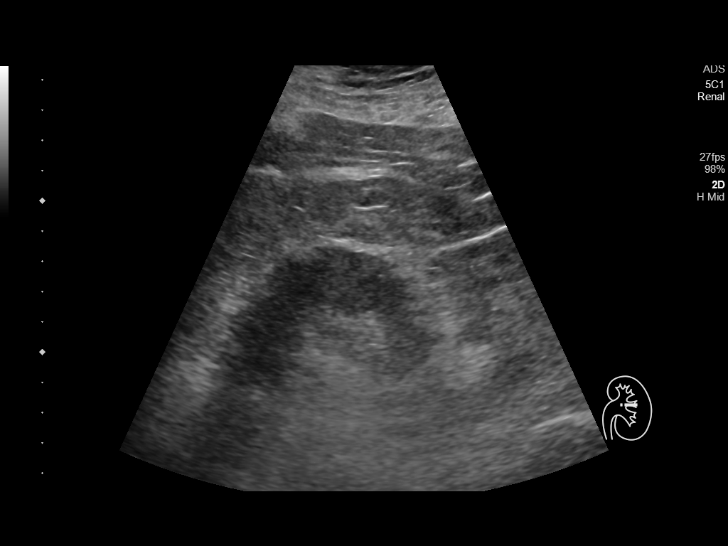
[im 22/29]
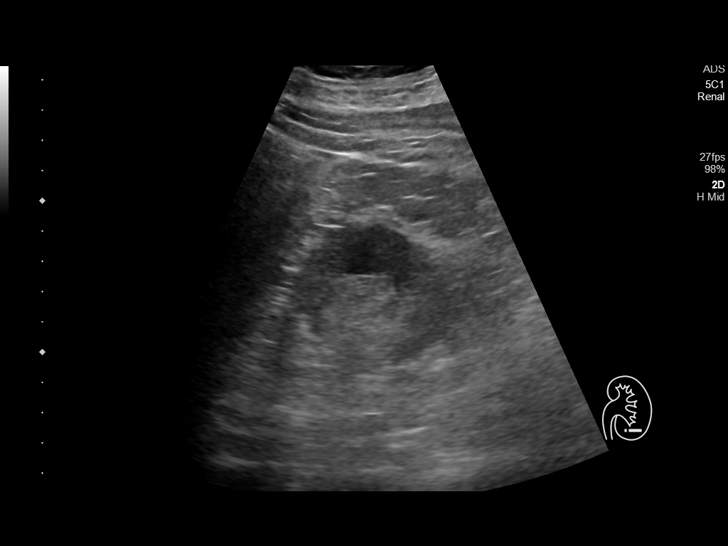
[im 24/29]
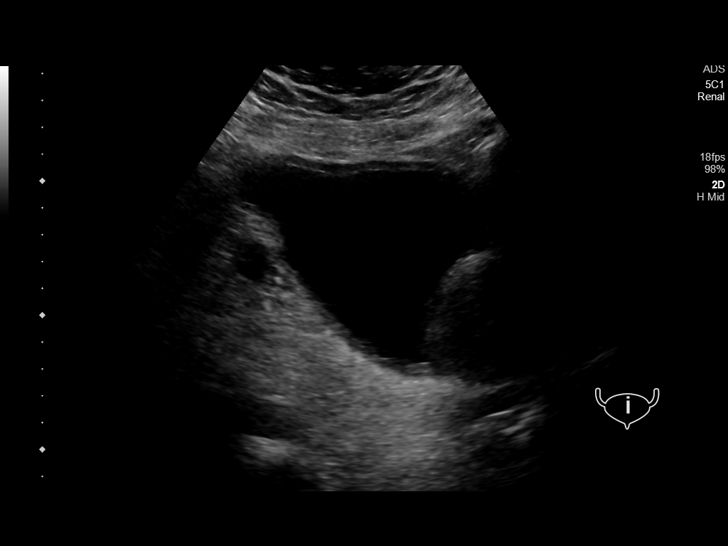
[im 26/29]
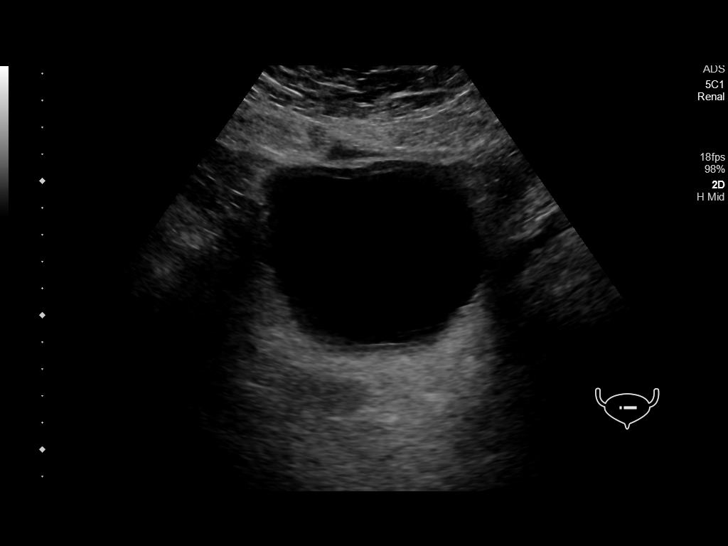
[im 29/29]
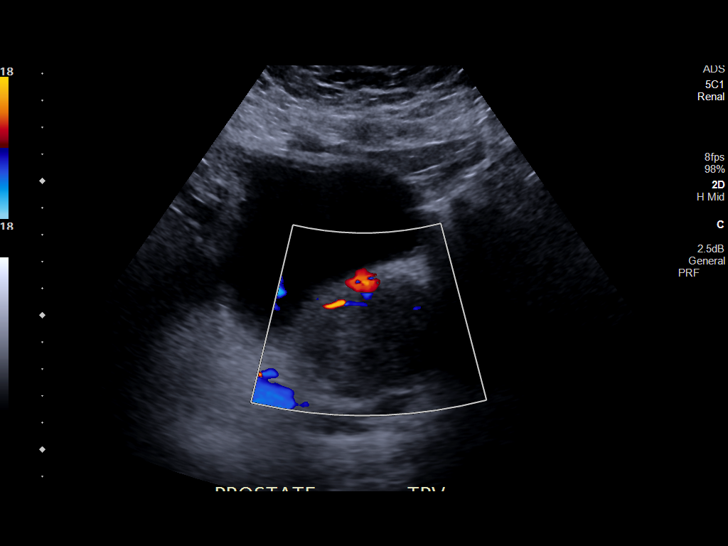

[14 of 25 positions shown; findings below may reference images not displayed]

FINDINGS: Right Kidney:

Renal measurements: 12.2 x 5.8 x 5.2 cm = volume: 185.2 mL.
Echogenicity and corticomedullary differentiation are within
expected normals. Mild perinephric edematous changes are similar to
comparison CT. No concerning renal mass or hydronephrosis. No
shadowing calculus.

Left Kidney:

Renal measurements: 13.0 x 6.1 x 4.6 cm = volume: 189 mL.
Echogenicity and corticomedullary differentiation within expected
normals. Mild perinephric edematous changes are similar to
comparison CT. No concerning renal mass or hydronephrosis. No
shadowing calculus.

Bladder:

Appears normal for degree of bladder distention.

Other:

Enlarged prostate with estimated prostate volume of 89.3 cm.
IMPRESSION: Mild perinephric edematous changes are similar to comparison CT and
can be seen in the setting diminished renal function or senescent
change. Preservation of corticomedullary differentiation without
other acute or suspicious renal abnormality.

Prostatomegaly.

## 2020-05-03 ENCOUNTER — Ambulatory Visit (INDEPENDENT_AMBULATORY_CARE_PROVIDER_SITE_OTHER): Payer: Medicare Other | Admitting: *Deleted

## 2020-05-03 DIAGNOSIS — I639 Cerebral infarction, unspecified: Secondary | ICD-10-CM | POA: Diagnosis not present

## 2020-05-03 LAB — CUP PACEART REMOTE DEVICE CHECK
Date Time Interrogation Session: 20210718230525
Implantable Pulse Generator Implant Date: 20181030

## 2020-05-05 DIAGNOSIS — I639 Cerebral infarction, unspecified: Secondary | ICD-10-CM | POA: Diagnosis not present

## 2020-05-05 NOTE — Progress Notes (Signed)
Carelink Summary Report / Loop Recorder 

## 2020-05-11 NOTE — Telephone Encounter (Signed)
Jake Gross called in regards to paperwork. Stated they still have not heard anything from office and would like to double check and see if papers were received. Please advise. Call back is [phone] ext. [phone].

## 2020-05-17 NOTE — Telephone Encounter (Signed)
Colletta Maryland with [employer] left v/m requesting cb with status of cardiovascular risk form being reviewed,signed and faxed back to fax # on coversheet. If have not received the fax please give Colletta Maryland with [employer] a call and if no answer can leave detailed v/m.

## 2020-05-21 ENCOUNTER — Other Ambulatory Visit: Payer: Self-pay | Admitting: Physician Assistant

## 2020-05-21 NOTE — Telephone Encounter (Addendum)
Jake Gross with [employer] left v/m requesting cb with update on form faxed for review and signature. Fax # [phone]. Do not see under media tab.

## 2020-05-31 ENCOUNTER — Other Ambulatory Visit: Payer: Self-pay | Admitting: Internal Medicine

## 2020-06-04 ENCOUNTER — Other Ambulatory Visit: Payer: Self-pay

## 2020-06-06 LAB — CUP PACEART REMOTE DEVICE CHECK
Date Time Interrogation Session: 20210818232712
Implantable Pulse Generator Implant Date: 20181030

## 2020-06-07 ENCOUNTER — Ambulatory Visit (INDEPENDENT_AMBULATORY_CARE_PROVIDER_SITE_OTHER): Payer: Medicare Other | Admitting: *Deleted

## 2020-06-07 DIAGNOSIS — I639 Cerebral infarction, unspecified: Secondary | ICD-10-CM

## 2020-06-07 NOTE — Progress Notes (Signed)
Carelink Summary Report / Loop Recorder 

## 2020-07-11 LAB — CUP PACEART REMOTE DEVICE CHECK
Date Time Interrogation Session: 20210918232238
Implantable Pulse Generator Implant Date: 20181030

## 2020-07-12 ENCOUNTER — Ambulatory Visit (INDEPENDENT_AMBULATORY_CARE_PROVIDER_SITE_OTHER): Payer: Medicare Other | Admitting: Emergency Medicine

## 2020-07-12 DIAGNOSIS — I639 Cerebral infarction, unspecified: Secondary | ICD-10-CM | POA: Diagnosis not present

## 2020-07-14 NOTE — Progress Notes (Signed)
Carelink Summary Report / Loop Recorder 

## 2020-08-03 ENCOUNTER — Telehealth: Payer: Self-pay

## 2020-08-04 ENCOUNTER — Ambulatory Visit (INDEPENDENT_AMBULATORY_CARE_PROVIDER_SITE_OTHER): Payer: Medicare Other

## 2020-08-04 DIAGNOSIS — I639 Cerebral infarction, unspecified: Secondary | ICD-10-CM

## 2020-08-05 ENCOUNTER — Encounter: Payer: Self-pay | Admitting: *Deleted

## 2020-08-05 LAB — CUP PACEART REMOTE DEVICE CHECK
Date Time Interrogation Session: 20211019233247
Implantable Pulse Generator Implant Date: 20181030

## 2020-08-05 NOTE — Telephone Encounter (Signed)
This encounter was created in error - please disregard.

## 2020-08-10 NOTE — Progress Notes (Signed)
Carelink Summary Report / Loop Recorder 

## 2020-08-17 ENCOUNTER — Other Ambulatory Visit: Payer: Self-pay | Admitting: Endocrinology

## 2020-08-30 ENCOUNTER — Other Ambulatory Visit: Payer: Self-pay | Admitting: *Deleted

## 2020-08-30 NOTE — Patient Outreach (Signed)
[facility]) Care Management  08/30/2020  [place] 25-Jun-1941 [id number]  RN Health Coach telephone call to patient.  Hipaa compliance verified. Per patient he was cooking and asked to be called back tomorrow.  [place] Care Management [phone]

## 2020-08-31 ENCOUNTER — Other Ambulatory Visit: Payer: Self-pay | Admitting: *Deleted

## 2020-08-31 NOTE — Patient Outreach (Signed)
[facility]) Care Management  08/31/2020  [place] 1941/03/02 [id number]   [place] attempted follow outreach screening call to patient.  Patient was unavailable. HIPPA compliance voicemail message left with return callback number.  Plan: RN will call patient again within 30 days.  [place] Care Management [phone]

## 2020-09-04 LAB — CUP PACEART REMOTE DEVICE CHECK
Date Time Interrogation Session: 20211119223614
Implantable Pulse Generator Implant Date: 20181030

## 2020-09-06 ENCOUNTER — Ambulatory Visit (INDEPENDENT_AMBULATORY_CARE_PROVIDER_SITE_OTHER): Payer: Medicare Other

## 2020-09-06 DIAGNOSIS — I639 Cerebral infarction, unspecified: Secondary | ICD-10-CM

## 2020-09-07 NOTE — Progress Notes (Signed)
Carelink Summary Report / Loop Recorder 

## 2020-09-14 ENCOUNTER — Other Ambulatory Visit: Payer: Self-pay | Admitting: Endocrinology

## 2020-09-16 ENCOUNTER — Other Ambulatory Visit: Payer: Self-pay | Admitting: Internal Medicine

## 2020-09-22 ENCOUNTER — Other Ambulatory Visit: Payer: Self-pay | Admitting: *Deleted

## 2020-09-23 NOTE — Patient Outreach (Signed)
[facility]) Care Management  [place]  09/22/2020  [place] [birth year] [id number]  [place] telephone call to patient.  Hipaa compliance verified. Per patient he has been checking his blood pressure once a week. Last pressure 140/79. Patient stated he did have some swelling in legs and ankles and now it is all gone. Patient appetite is good and he is adhering to a low sodium diet. Per patient he is not doing regular exercised but has been raking leaves in yard for 3 days this week. Patient stated he is not sleeping through the nigh. He is getting up at least 5 times to void. Patient has agreed to follow up outreach calls.      Encounter Medications:  Outpatient Encounter Medications as of 09/22/2020  Medication Sig  . amLODipine (NORVASC) 5 MG tablet TAKE 1 TABLET BY MOUTH EVERY DAY  . apixaban (ELIQUIS) 5 MG TABS tablet Take 5 mg by mouth 2 (two) times daily.  . Cyanocobalamin 1000 MCG CAPS Take by mouth.  . fenofibrate micronized (LOFIBRA) 67 MG capsule TAKE 1 CAPSULE BY MOUTH DAILY BEFORE BREAKFAST. SCHEDULE PHYSICAL  . ferrous sulfate 325 (65 FE) MG tablet Take 1 tablet (325 mg total) by mouth daily.  . hydrALAZINE (APRESOLINE) 100 MG tablet Take 1 tablet (100 mg total) by mouth 3 (three) times daily.  . isosorbide mononitrate (IMDUR) 60 MG 24 hr tablet TAKE 1 TABLET BY MOUTH EVERY DAY  . LANTUS SOLOSTAR 100 UNIT/ML Solostar Pen INJECT 60 UNITS INTO THE SKIN DAILY  . metoprolol tartrate (LOPRESSOR) 25 MG tablet Take 1.5 tablets (37.5 mg total) by mouth 2 (two) times daily.  . ONE TOUCH ULTRA TEST test strip USE 1 STRIP TWICE A DAY  . ONETOUCH DELICA LANCETS 49S MISC   . pantoprazole (PROTONIX) 40 MG tablet Take 1 tablet (40 mg total) by mouth daily before breakfast.  . rosuvastatin (CRESTOR) 20 MG tablet TAKE 1 TABLET (20 MG TOTAL) BY MOUTH AT BEDTIME. SCHEDULE PHYSICAL EXAM  . tamsulosin (FLOMAX) 0.4 MG CAPS capsule TAKE 1 CAPSULE BY MOUTH  EVERY DAY  . torsemide (DEMADEX) 20 MG tablet Take 1 tablet (20 mg total) by mouth 2 (two) times daily. Take one extra tablet every Mon, Wed, Fri   No facility-administered encounter medications on file as of 09/22/2020.    Functional Status:  No flowsheet data found.  Fall/Depression Screening: Fall Risk  11/19/2018 08/07/2018 07/16/2018  Falls in the past year? 1 Yes Yes  Number falls in past yr: 0 1 1  Injury with Fall? 0 Yes No  Risk for fall due to : History of fall(s);Impaired balance/gait;Impaired mobility - History of fall(s);Impaired balance/gait;Impaired mobility  Follow up Falls evaluation completed - Falls evaluation completed;Falls prevention discussed;Education provided   [facility] 2/9 Scores 09/22/2020 11/19/2018 07/16/2018 12/19/2017 12/05/2017 11/16/2017 09/13/2017  PHQ - 2 Score 0 0 0 0 0 0 0    Assessment:  Goals Addressed            This Visit's Progress   . (THN)Lifestyle Change-Hypertension       Timeframe:  Long-Range Goal Priority:  Medium Start Date: [id number]                            Expected End Date:   [id number]                     - agree to work together to  make changes - learn about high blood pressure    Why is this important?    The changes that you are asked to make may be hard to do.   This is especially true when the changes are life-long.   Knowing why it is important to you is the first step.   Working on the change with your family or support person helps you not feel alone.   Reward yourself and family or support person when goals are met. This can be an activity you choose like bowling, hiking, biking, swimming or shooting hoops.     Notes:     . [facility] and Keep All Appointments       Timeframe:  Long-Range Goal Priority:  High Start Date:     24825003                        Expected End Date:       70488891               Follow up [id number]   - call to cancel if needed - keep a calendar with prescription refill dates - keep a  calendar with appointment dates    Why is this important?    Part of staying healthy is seeing the doctor for follow-up care.   If you forget your appointments, there are some things you can do to stay on track.    Notes:     . (THN)Track and Manage My Blood Pressure-Hypertension       Timeframe:  Short-Term Goal Priority:  High Start Date:      82800349                       Expected End [id number]                      Follow up visit [id number]   - check blood pressure weekly - choose a place to take my blood pressure (home, clinic or office, retail store) - write blood pressure results in a log or diary    Why is this important?    You won't feel high blood pressure, but it can still hurt your blood vessels.   High blood pressure can cause heart or kidney problems. It can also cause a stroke.   Making lifestyle changes like losing a little weight or eating less salt will help.   Checking your blood pressure at home and at different times of the day can help to control blood pressure.   If the doctor prescribes medicine remember to take it the way the doctor ordered.   Call the office if you cannot afford the medicine or if there are questions about it.     Notes:        Plan:  Follow-up:  Patient agrees to Care Plan and Follow-up.  Referred to pharmacy Patient will monitor and document blood pressure RN provided a calendar book RN sent information on compression hose RN will follow up within the month of March RN sent assessment to PCP  [place] Management [phone]

## 2020-09-23 NOTE — Patient Outreach (Signed)
[facility]) Care Management  09/23/2020  Jake Gross June 12, 1941 [id number]  Referral sent to [name] with [place] via email at [name].  [name] Management Assistant [phone]

## 2020-10-01 ENCOUNTER — Ambulatory Visit: Payer: Medicare Other | Attending: Internal Medicine

## 2020-10-01 DIAGNOSIS — Z23 Encounter for immunization: Secondary | ICD-10-CM

## 2020-10-01 NOTE — Progress Notes (Signed)
   Covid-19 Vaccination Clinic  Name:  Chasten Blaze Phillis    MRN: [id number] DOB: 1941-06-28  10/01/2020  Mr. Fahrner was observed post Covid-19 immunization for 15 minutes without incident. He was provided with Vaccine Information Sheet and instruction to access the V-Safe system.   Mr. Surette was instructed to call 911 with any severe reactions post vaccine: Marland Kitchen Difficulty breathing  . Swelling of face and throat  . A fast heartbeat  . A bad rash all over body  . Dizziness and weakness   Immunizations Administered    Name Date Dose VIS Date Route   Pfizer COVID-19 Vaccine 10/01/2020  1:32 PM 0.3 mL 08/04/2020 Intramuscular   Manufacturer: [place]   Lot: [id number]   [place]: [address]-1

## 2020-10-05 ENCOUNTER — Ambulatory Visit (INDEPENDENT_AMBULATORY_CARE_PROVIDER_SITE_OTHER): Payer: Medicare Other

## 2020-10-05 DIAGNOSIS — I639 Cerebral infarction, unspecified: Secondary | ICD-10-CM

## 2020-10-05 LAB — CUP PACEART REMOTE DEVICE CHECK
Date Time Interrogation Session: 20211220223414
Implantable Pulse Generator Implant Date: 20181030

## 2020-10-06 NOTE — Patient Instructions (Addendum)
Goals Addressed            This Visit's Progress   . (THN)Lifestyle Change-Hypertension       Timeframe:  Long-Range Goal Priority:  Medium Start Date: [id number]                            Expected End Date:   01601093                     - agree to work together to make changes - learn about high blood pressure    Why is this important?    The changes that you are asked to make may be hard to do.   This is especially true when the changes are life-long.   Knowing why it is important to you is the first step.   Working on the change with your family or support person helps you not feel alone.   Reward yourself and family or support person when goals are met. This can be an activity you choose like bowling, hiking, biking, swimming or shooting hoops.     Notes:     . [facility] and Keep All Appointments       Timeframe:  Long-Range Goal Priority:  High Start Date:     [id number]                        Expected End Date:       02542706               Follow up [id number]   - call to cancel if needed - keep a calendar with prescription refill dates - keep a calendar with appointment dates    Why is this important?    Part of staying healthy is seeing the doctor for follow-up care.   If you forget your appointments, there are some things you can do to stay on track.    Notes:     . (THN)Track and Manage My Blood Pressure-Hypertension       Timeframe:  Short-Term Goal Priority:  High Start Date:      51761607                       Expected End [id number]                      Follow up visit [id number]   - check blood pressure weekly - choose a place to take my blood pressure (home, clinic or office, retail store) - write blood pressure results in a log or diary    Why is this important?    You won't feel high blood pressure, but it can still hurt your blood vessels.   High blood pressure can cause heart or kidney problems. It can also cause a stroke.   Making  lifestyle changes like losing a little weight or eating less salt will help.   Checking your blood pressure at home and at different times of the day can help to control blood pressure.   If the doctor prescribes medicine remember to take it the way the doctor ordered.   Call the office if you cannot afford the medicine or if there are questions about it.     Notes:

## 2020-10-12 ENCOUNTER — Other Ambulatory Visit: Payer: Self-pay | Admitting: Internal Medicine

## 2020-10-13 ENCOUNTER — Other Ambulatory Visit: Payer: Self-pay | Admitting: Endocrinology

## 2020-10-19 NOTE — Progress Notes (Signed)
Carelink Summary Report / Loop Recorder 

## 2020-11-05 ENCOUNTER — Ambulatory Visit (INDEPENDENT_AMBULATORY_CARE_PROVIDER_SITE_OTHER): Payer: Medicare Other

## 2020-11-05 DIAGNOSIS — I639 Cerebral infarction, unspecified: Secondary | ICD-10-CM

## 2020-11-09 ENCOUNTER — Other Ambulatory Visit: Payer: Self-pay | Admitting: Internal Medicine

## 2020-11-15 LAB — CUP PACEART REMOTE DEVICE CHECK
Date Time Interrogation Session: 20220129231135
Implantable Pulse Generator Implant Date: 20181030

## 2020-11-17 NOTE — Progress Notes (Signed)
Carelink Summary Report / Loop Recorder 

## 2020-11-30 ENCOUNTER — Telehealth: Payer: Self-pay | Admitting: Emergency Medicine

## 2020-11-30 ENCOUNTER — Other Ambulatory Visit: Payer: Self-pay | Admitting: Physician Assistant

## 2020-11-30 NOTE — Telephone Encounter (Signed)
Patient notified LINQ at [facility]. He does not want to schedule an explant at this time. Instructed to unplug remote monitor and address confirmed to send return kit for home remote monitor.

## 2020-12-04 ENCOUNTER — Other Ambulatory Visit: Payer: Self-pay | Admitting: Internal Medicine

## 2020-12-05 ENCOUNTER — Other Ambulatory Visit: Payer: Self-pay | Admitting: Physician Assistant

## 2020-12-07 ENCOUNTER — Other Ambulatory Visit: Payer: Self-pay | Admitting: Interventional Cardiology

## 2020-12-16 ENCOUNTER — Other Ambulatory Visit: Payer: Self-pay | Admitting: Physician Assistant

## 2020-12-29 ENCOUNTER — Other Ambulatory Visit: Payer: Self-pay | Admitting: Cardiovascular Disease

## 2020-12-29 ENCOUNTER — Other Ambulatory Visit: Payer: Self-pay | Admitting: Internal Medicine

## 2021-01-04 ENCOUNTER — Other Ambulatory Visit: Payer: Self-pay | Admitting: *Deleted

## 2021-01-04 NOTE — Patient Outreach (Signed)
[facility]) Care Management  01/04/2021  [place] [birth year] [id number]  RN Health Coach attempted follow up outreach call to patient.  Patient was unavailable. HIPPA compliance voicemail message left with return callback number.  Plan: RN will call patient again within 30 days.  [place] Care Management [phone]

## 2021-01-11 ENCOUNTER — Other Ambulatory Visit: Payer: Self-pay | Admitting: Cardiovascular Disease

## 2021-01-12 ENCOUNTER — Other Ambulatory Visit: Payer: Self-pay | Admitting: Physician Assistant

## 2021-01-22 ENCOUNTER — Other Ambulatory Visit: Payer: Self-pay | Admitting: Internal Medicine

## 2021-01-22 ENCOUNTER — Other Ambulatory Visit: Payer: Self-pay | Admitting: Cardiovascular Disease

## 2021-02-10 ENCOUNTER — Other Ambulatory Visit: Payer: Self-pay | Admitting: *Deleted

## 2021-02-10 ENCOUNTER — Other Ambulatory Visit: Payer: Self-pay | Admitting: Cardiovascular Disease

## 2021-02-10 NOTE — Patient Outreach (Signed)
[facility]) Care Management  02/10/2021  [place] May 05, 1941 [id number]   RN Health Coach attempted follow up outreach call to patient.  Patient was unavailable. HIPPA compliance voicemail message left with return callback number.  Plan: RN will call patient again within 30 days.  [place] Care Management [phone]

## 2021-02-17 ENCOUNTER — Other Ambulatory Visit: Payer: Self-pay | Admitting: Internal Medicine

## 2021-02-18 ENCOUNTER — Other Ambulatory Visit: Payer: Self-pay | Admitting: Cardiovascular Disease

## 2021-02-19 ENCOUNTER — Other Ambulatory Visit: Payer: Self-pay | Admitting: Cardiovascular Disease

## 2021-02-22 ENCOUNTER — Other Ambulatory Visit: Payer: Self-pay | Admitting: Endocrinology

## 2021-02-22 ENCOUNTER — Other Ambulatory Visit: Payer: Self-pay | Admitting: *Deleted

## 2021-02-22 NOTE — Patient Outreach (Signed)
[facility]) Care Management  02/22/2021  [place] 05-08-1941 [id number]   RN Health Coach attempted follow up outreach call to patient.  Patient was unavailable. HIPPA compliance voicemail message left with return callback number.  Plan: RN will call patient again within 30 days. Unsuccessful outreach letter sent  [place] Management [phone]

## 2021-02-26 ENCOUNTER — Other Ambulatory Visit: Payer: Self-pay | Admitting: Cardiovascular Disease

## 2021-02-28 ENCOUNTER — Other Ambulatory Visit: Payer: Self-pay | Admitting: Cardiovascular Disease

## 2021-03-09 ENCOUNTER — Other Ambulatory Visit: Payer: Self-pay | Admitting: Internal Medicine

## 2021-03-09 NOTE — Telephone Encounter (Signed)
Attempted to call patient to see what his care plan was- left message to call SGM for appointment with R Baity,NP- or call [facility] for continuing care there. Rx had previously been denied- appointment needed.

## 2021-03-16 ENCOUNTER — Other Ambulatory Visit: Payer: Self-pay | Admitting: Cardiovascular Disease

## 2021-03-17 ENCOUNTER — Other Ambulatory Visit: Payer: Self-pay | Admitting: Physician Assistant

## 2021-03-23 ENCOUNTER — Other Ambulatory Visit: Payer: Self-pay | Admitting: *Deleted

## 2021-03-23 NOTE — Patient Outreach (Signed)
[name]) Care Management  03/23/2021  Jake [place] April 17, 1941 [id number]  RN Health Coach attempted follow up outreach call to patient.  Patient was unavailable. HIPPA compliance voicemail message left with return callback number.  Plan: RN will call patient again within 30 days.  [place] Care Management [phone]

## 2021-03-30 ENCOUNTER — Other Ambulatory Visit: Payer: Self-pay | Admitting: Physician Assistant

## 2021-03-30 ENCOUNTER — Other Ambulatory Visit: Payer: Self-pay | Admitting: Cardiovascular Disease

## 2021-04-04 ENCOUNTER — Telehealth: Payer: Self-pay | Admitting: Cardiovascular Disease

## 2021-04-04 MED ORDER — AMLODIPINE BESYLATE 5 MG PO TABS: 5.0000 mg | ORAL_TABLET | Freq: Every day | ORAL | 0 refills | Status: DC

## 2021-04-04 NOTE — Telephone Encounter (Unsigned)
*  STAT* If patient is at the pharmacy, call can be transferred to refill team.   1. Which medications need to be refilled? (please list name of each medication and dose if known) amLODipine (NORVASC) 5 MG tablet*****  2. Which pharmacy/location (including street and city if local pharmacy) is medication to be sent to? [id number] [place], [place] - [address]  3. Do they need a 30 day or 90 day supply? [place]

## 2021-04-04 NOTE — Telephone Encounter (Signed)
Attempted to reach patient on home phone; no answer.   Rx(s) sent to pharmacy electronically.

## 2021-04-05 ENCOUNTER — Other Ambulatory Visit: Payer: Self-pay

## 2021-04-05 ENCOUNTER — Ambulatory Visit (INDEPENDENT_AMBULATORY_CARE_PROVIDER_SITE_OTHER): Payer: Medicare Other | Admitting: Endocrinology

## 2021-04-05 ENCOUNTER — Encounter: Payer: Self-pay | Admitting: Endocrinology

## 2021-04-05 VITALS — BP 168/80 | HR 86 | Ht 67.0 in | Wt 204.0 lb

## 2021-04-05 DIAGNOSIS — Z794 Long term (current) use of insulin: Secondary | ICD-10-CM

## 2021-04-05 DIAGNOSIS — I639 Cerebral infarction, unspecified: Secondary | ICD-10-CM | POA: Diagnosis not present

## 2021-04-05 DIAGNOSIS — E119 Type 2 diabetes mellitus without complications: Secondary | ICD-10-CM | POA: Diagnosis not present

## 2021-04-05 LAB — POCT GLYCOSYLATED HEMOGLOBIN (HGB A1C): Hemoglobin A1C: 10.9 % — AB (ref 4.0–5.6)

## 2021-04-05 MED ORDER — ONETOUCH VERIO VI STRP: 1.0000 | ORAL_STRIP | Freq: Two times a day (BID) | 3 refills | Status: DC

## 2021-04-05 MED ORDER — LANTUS SOLOSTAR 100 UNIT/ML ~~LOC~~ SOPN: 60.0000 [IU] | PEN_INJECTOR | SUBCUTANEOUS | 3 refills | Status: DC

## 2021-04-05 NOTE — Progress Notes (Signed)
Subjective:    Patient ID: [employer], male    DOB: Apr 24, 1941, 80 y.o.   MRN: [id number]  HPI Pt returns for f/u of diabetes mellitus:  DM type: Insulin-requiring type 2 DM.   Dx'ed: 6440 Complications: polyneuropathy, retinopathy, CVA, PAD, CAD, and renal failure.  Therapy: insulin since 2005 DKA: never Severe hypoglycemia: last episode was in 2014.   Pancreatitis: never.  Pancreatic imaging: normal CT in 2006 Other: he eats 2 meals per day; he takes QD insulin, after poor results with multiple daily injections.   Interval history: He has not recently taken lantus.  He says when on Lantus, cbg fasting varied from 80-100's.  pt states he feels well in general.   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   CAD (coronary artery disease) 09/29/2019   Cervical spondylosis    Chronic anticoagulation    Chronic diastolic CHF (congestive heart failure) ([place]) 12/17/2018   CKD (chronic kidney disease) 12/17/2018   Diabetes mellitus    History of GI bleed 09/29/2019   Hyperlipidemia    Hypertension    Iron deficiency anemia due to chronic blood loss    Paroxysmal atrial fibrillation ([place]) 12/17/2018   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 NO ISCHEMIA   COLONOSCOPY W/ POLYPECTOMY     5 polyps   COLONOSCOPY WITH PROPOFOL N/A 08/29/2019   Procedure: COLONOSCOPY WITH PROPOFOL;  Surgeon: Doran Stabler, MD;  Location: [facility];  Service: Gastroenterology;  Laterality: N/A;   CORONARY ARTERY BYPASS GRAFT N/A 11/26/2018   Procedure:  CORONARY ARTERY BYPASS GRAFTING (CABG), ON PUMP, TIMES FOUR, USING LEFT INTERNAL MAMMARY ARTERY AND ENDOSCOPICALLY HARVESTED LEFT SAPHENOUS VEIN;  Surgeon: Gaye Pollack, MD;  Location: [place];  Service: Open Heart Surgery;  Laterality: N/A;   ESOPHAGOGASTRODUODENOSCOPY (EGD) WITH PROPOFOL N/A 08/29/2019   Procedure: ESOPHAGOGASTRODUODENOSCOPY (EGD) WITH PROPOFOL;  Surgeon: Doran Stabler, MD;  Location: [place];  Service: Gastroenterology;  Laterality: N/A;   EUS N/A 03/11/2015   Procedure: LOWER ENDOSCOPIC ULTRASOUND (EUS);  Surgeon: Milus Banister, MD;  [facility];  Service: Endoscopy;  Laterality: N/A;   FLEXIBLE SIGMOIDOSCOPY N/A 02/02/2015   Procedure: FLEXIBLE SIGMOIDOSCOPY;  Surgeon: Inda Castle, MD;  [facility];  Service: Endoscopy;  Laterality: N/A;  ERBE   HEMOSTASIS CLIP PLACEMENT  08/29/2019   Procedure: HEMOSTASIS CLIP PLACEMENT;  Surgeon: Doran Stabler, MD;  Location: [facility] [facility];  Service: Gastroenterology;;   HOT HEMOSTASIS N/A 08/29/2019   Procedure: HOT HEMOSTASIS (ARGON PLASMA COAGULATION/BICAP);  Surgeon: Doran Stabler, MD;  Location: [place];  Service: Gastroenterology;  Laterality: N/A;   LEFT HEART CATH AND CORONARY ANGIOGRAPHY N/A 11/20/2018   Procedure: LEFT HEART CATH AND CORONARY ANGIOGRAPHY;  Surgeon: Burnell Blanks, MD;  Location: [place] CV [facility];  Service: Cardiovascular;  Laterality: N/A;   LOOP RECORDER INSERTION N/A 08/14/2017   Procedure: LOOP RECORDER INSERTION;  Surgeon: Thompson Grayer, MD;  Location: [place] [facility];  Service: Cardiovascular;  Laterality: N/A;   PARTIAL PROCTECTOMY BY TEM N/A 04/08/2015   Procedure: TEM PARTIAL PROCTECTOMY OF RECTAL MASS;  Surgeon: Michael Boston, MD;  [facility];  Service: General;  Laterality: N/A;   POLYPECTOMY  08/29/2019   Procedure: POLYPECTOMY;  Surgeon: Doran Stabler, MD;  Location: [facility];  Service: Gastroenterology;;   TEE WITHOUT  CARDIOVERSION N/A 06/25/2017   Procedure: TRANSESOPHAGEAL ECHOCARDIOGRAM (TEE);  Surgeon: Skeet Latch, MD;  Location: [place];  Service: Cardiovascular;  Laterality: N/A;   TEE WITHOUT CARDIOVERSION N/A 11/26/2018   Procedure: TRANSESOPHAGEAL ECHOCARDIOGRAM (TEE);  Surgeon: Gaye Pollack, MD;  Location: [place];  Service: Open Heart Surgery;  Laterality: N/A;   TONSILLECTOMY AND ADENOIDECTOMY     as child    Social History   Socioeconomic History   Marital status: Divorced    Spouse name: Not on file   Number of children: 2   Years of education: Not on file   Highest education level: Not on file  Occupational History   Occupation: retired    Fish farm manager: [employer]  Tobacco Use   Smoking status: Former    Pack years: 0.00    Types: Cigarettes    Quit date: 08/13/1994    Years since quitting: 26.6   Smokeless tobacco: Never  Vaping Use   Vaping Use: Never used  Substance and Sexual Activity   Alcohol use: No    Alcohol/week: 0.0 standard drinks   Drug use: No   Sexual activity: Not Currently    Birth control/protection: None  Other Topics Concern   Not on file  Social History Narrative   Not on file   Social Determinants of Health   Financial Resource Strain: Not on file  Food Insecurity: No Food Insecurity   Worried About Charity fundraiser in the Last Year: Never true   [place] in the Last Year: Never true  Transportation Needs: No Transportation Needs   Lack of Transportation (Medical): No   Lack of Transportation (Non-Medical): No  Physical Activity: Not on file  Stress: Not on file  Social Connections: Not on file  Intimate Partner Violence: Not on file    Current Outpatient Medications on File Prior to Visit  Medication Sig Dispense Refill   amLODipine (NORVASC) 5 MG tablet Take 1 tablet (5 mg total) by mouth daily. 8 tablet 0   apixaban (ELIQUIS) 5 MG TABS tablet Take 5 mg by mouth 2 (two) times daily.     Cyanocobalamin 1000 MCG CAPS Take by  mouth.     fenofibrate micronized (LOFIBRA) 67 MG capsule TAKE 1 CAPSULE BY MOUTH DAILY BEFORE BREAKFAST. SCHEDULE PHYSICAL 90 capsule 0   ferrous sulfate 325 (65 FE) MG tablet Take 1 tablet (325 mg total) by mouth daily. 30 tablet 0   hydrALAZINE (APRESOLINE) 100 MG tablet Take 1 tablet (100 mg total) by mouth 3 (three) times daily. Please make overdue appt with Dr. Angelena Form before anymore refills. Thank you 2nd attempt 45 tablet 0   isosorbide mononitrate (IMDUR) 60 MG 24 hr tablet TAKE 1 TABLET BY MOUTH EVERY DAY 90 tablet 3   metoprolol tartrate (LOPRESSOR) 25 MG tablet TAKE 1 AND 1/2 TABLETS BY MOUTH TWICE A DAY NEEDS APPT FOR REFILLS 270 tablet 2   pantoprazole (PROTONIX) 40 MG tablet Take 1 tablet (40 mg total) by mouth daily before breakfast. 30 tablet 0   rosuvastatin (CRESTOR) 20 MG tablet TAKE 1 TABLET BY MOUTH AT BEDTIME. SCHEDULE PHYSICAL EXAM 30  tablet 0   tamsulosin (FLOMAX) 0.4 MG CAPS capsule TAKE 1 CAPSULE BY MOUTH EVERY DAY 90 capsule 1   torsemide (DEMADEX) 20 MG tablet Take 1 tablet (20 mg total) by mouth 2 (two) times daily. Please make overdue appt with Dr. Angelena Form before anymore refills. Thank you 2nd attempt 30 tablet 0   No current facility-administered medications on file prior to visit.    No Known Allergies  Family History  Problem Relation Age of Onset   Cancer Father        "all over"   Coronary artery disease Brother    Diabetes Brother    Stroke Mother    Diabetes Mother    Cancer Brother    Colon cancer Neg Hx    Esophageal cancer Neg Hx    Rectal cancer Neg Hx    Stomach cancer Neg Hx     BP (!) 168/80   Pulse 86   Ht 5\' 7"  (1.702 m)   Wt 204 lb (92.5 kg)   SpO2 94%   BMI 31.95 kg/m    Review of Systems     Objective:   Physical Exam Pulses: dorsalis pedis intact bilat.   MSK: no deformity of the feet.   CV: no leg edema.   Skin:  no ulcer on the feet.  normal color and temp on the feet.   Neuro: sensation is intact to touch on the  feet.    A1c=10.9%     Assessment & Plan:  Insulin-requiring type 2 DM: uncontrolled   Patient Instructions  I have sent a prescription to your pharmacy, to resume the Lantus. check your blood sugar twice a day.  vary the time of day when you check, between before the 3 meals, and at bedtime.  also check if you have symptoms of your blood sugar being too high or too low.  please keep a record of the readings and bring it to your next appointment here (or you can bring the meter itself).  You can write it on any piece of paper.  please call us sooner if your blood sugar goes below 70, or if you have a lot of readings over 200.   Please have a follow-up appointment in 3 months.

## 2021-04-05 NOTE — Patient Instructions (Addendum)
I have sent a prescription to your pharmacy, to resume the Lantus. check your blood sugar twice a day.  vary the time of day when you check, between before the 3 meals, and at bedtime.  also check if you have symptoms of your blood sugar being too high or too low.  please keep a record of the readings and bring it to your next appointment here (or you can bring the meter itself).  You can write it on any piece of paper.  please call us sooner if your blood sugar goes below 70, or if you have a lot of readings over 200.   Please have a follow-up appointment in 3 months.

## 2021-04-05 NOTE — Progress Notes (Deleted)
Cardiology Office Note   Date:  04/05/2021   ID:  Jake Gross, DOB 03/10/1941, MRN [id number]  PCP:  Jearld Fenton, NP  Cardiologist:  Dr. Angelena Form, MD   No chief complaint on file.  History of Present Illness: Jake Gross is a 80 y.o. male who presents for follow up, seen for Dr. Angelena Form.   [name] has a hx of CAD s/p CABG in February 2020, HTN, HLD, CKD stage III-IV, prior CVA and paroxysmal atrial fibrillation on Eliquis. He is followed by Dr. Rayann Heman for his AF. He has a LINQ implant with last download 11/13/20 that showed no new arrhythmias. In 11/2018 he was found to have 3V CAD and severe LM disease. He subsequently underwent CABG 11/2018. Echocardiogram showed LVEF 60-65% with no significant valve disease. 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 He was cleared to restart Eliquis. Last echocardiogram showed normal LVEF with mild to mod MR. He has known CKD and is followed by nephrology.   He was last seen by Dr. Angelena Form 01/2020 in annual follow up and was doing very well from a CV standpoint.   1. CAD s/p CABG without angina: No chest pain. Continue ASA, statin, beta blocker and Imdur. .   2. HTN: BP is near goal. Continue current therapy.     3. Paroxysmal atrial fibrillation: Followed in our EP clinic by Dr. Rayann Heman. Sinus today on exam.Continue Eliquis and beta blocker.     4. Chronic diastolic CHF: Weight is up 3 lbs over the past week. His LE edema comes and goes in the left leg. Continue Demadex. He will take an extra 20 mg of Demadex on M,W,F.   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   CAD  (coronary artery disease) 09/29/2019   Cervical spondylosis    Chronic anticoagulation    Chronic diastolic CHF (congestive heart failure) ([place]) 12/17/2018   CKD (chronic kidney disease) 12/17/2018   Diabetes mellitus    History of GI bleed 09/29/2019   Hyperlipidemia    Hypertension    Iron deficiency anemia due to chronic blood loss    Paroxysmal atrial fibrillation (Jake Gross) 12/17/2018   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 NO ISCHEMIA   COLONOSCOPY W/ POLYPECTOMY     5 polyps   COLONOSCOPY WITH PROPOFOL N/A 08/29/2019   Procedure: COLONOSCOPY WITH PROPOFOL;  Surgeon: Doran Stabler, MD;  Location: [place];  Service: Gastroenterology;  Laterality: N/A;   CORONARY ARTERY BYPASS GRAFT N/A 11/26/2018   Procedure: CORONARY ARTERY BYPASS GRAFTING (CABG), ON PUMP, TIMES FOUR, USING LEFT INTERNAL MAMMARY ARTERY AND ENDOSCOPICALLY HARVESTED LEFT SAPHENOUS VEIN;  Surgeon: Gaye Pollack, MD;  Location: [place];  Service: Open Heart Surgery;  Laterality: N/A;   ESOPHAGOGASTRODUODENOSCOPY (EGD) WITH PROPOFOL N/A 08/29/2019   Procedure: ESOPHAGOGASTRODUODENOSCOPY (EGD) WITH PROPOFOL;  Surgeon: Doran Stabler, MD;  [facility];  Service: Gastroenterology;  Laterality: N/A;   EUS N/A 03/11/2015   Procedure: LOWER ENDOSCOPIC ULTRASOUND (EUS);  Surgeon: Milus Banister, MD;  [facility];  Service: Endoscopy;  Laterality: N/A;   FLEXIBLE SIGMOIDOSCOPY N/A 02/02/2015   Procedure: FLEXIBLE SIGMOIDOSCOPY;  Surgeon: Inda Castle, MD;  [facility];  Service: Endoscopy;  Laterality: N/A;  ERBE   HEMOSTASIS CLIP PLACEMENT  08/29/2019   Procedure: HEMOSTASIS CLIP PLACEMENT;  Surgeon: Doran Stabler, MD;  Location: [place] [facility];  Service: Gastroenterology;;   HOT HEMOSTASIS N/A  08/29/2019   Procedure: HOT HEMOSTASIS (ARGON PLASMA COAGULATION/BICAP);  Surgeon: Doran Stabler, MD;  Location: [place];  Service: Gastroenterology;  Laterality: N/A;   LEFT HEART CATH AND CORONARY ANGIOGRAPHY N/A 11/20/2018   Procedure: LEFT HEART CATH AND CORONARY ANGIOGRAPHY;  Surgeon: Burnell Blanks, MD;  Location: [place] [facility];  Service: Cardiovascular;  Laterality: N/A;   LOOP RECORDER INSERTION N/A 08/14/2017   Procedure: LOOP RECORDER INSERTION;  Surgeon: Thompson Grayer, MD;  Location: [place] [facility];  Service: Cardiovascular;  Laterality: N/A;   PARTIAL PROCTECTOMY BY TEM N/A 04/08/2015   Procedure: TEM PARTIAL PROCTECTOMY OF RECTAL MASS;  Surgeon: Michael Boston, MD;  [facility];  Service: General;  Laterality: N/A;   POLYPECTOMY  08/29/2019   Procedure: POLYPECTOMY;  Surgeon: Doran Stabler, MD;  Location: [facility];  Service: Gastroenterology;;   TEE WITHOUT CARDIOVERSION N/A 06/25/2017   Procedure: TRANSESOPHAGEAL ECHOCARDIOGRAM (TEE);  Surgeon: Skeet Latch, MD;  Location: [place];  Service: Cardiovascular;  Laterality: N/A;   TEE WITHOUT CARDIOVERSION N/A 11/26/2018   Procedure: TRANSESOPHAGEAL ECHOCARDIOGRAM (TEE);  Surgeon: Gaye Pollack, MD;  Location: [place];  Service: Open Heart Surgery;  Laterality: N/A;   TONSILLECTOMY AND ADENOIDECTOMY     as child     Current Outpatient Medications  Medication Sig Dispense Refill   amLODipine (NORVASC) 5 MG tablet Take 1 tablet (5 mg total) by mouth daily. 8 tablet 0   apixaban (ELIQUIS) 5 MG TABS tablet Take 5 mg by mouth 2 (two) times daily.     Cyanocobalamin 1000 MCG CAPS Take by mouth.     fenofibrate micronized (LOFIBRA) 67 MG capsule TAKE 1 CAPSULE BY MOUTH DAILY BEFORE BREAKFAST. SCHEDULE PHYSICAL 90 capsule 0   ferrous sulfate 325 (65 FE) MG tablet Take 1 tablet (325 mg total) by mouth daily. 30 tablet 0   glucose blood (ONETOUCH VERIO) test strip 1 each by Other route in the  morning and at bedtime. And lancets 2/day 200 each 3   hydrALAZINE (APRESOLINE) 100 MG tablet Take 1 tablet (100 mg total) by mouth 3 (three) times daily. Please make overdue appt with Dr. Angelena Form before anymore refills. Thank you 2nd attempt 45 tablet 0   insulin glargine (LANTUS SOLOSTAR) 100 UNIT/ML Solostar Pen Inject 60 Units into the skin every morning. 60 mL 3   isosorbide mononitrate (IMDUR) 60 MG 24 hr tablet TAKE 1 TABLET BY MOUTH EVERY DAY 90 tablet 3   metoprolol tartrate (LOPRESSOR) 25 MG tablet TAKE 1 AND 1/2 TABLETS BY MOUTH TWICE A DAY NEEDS APPT FOR REFILLS 270 tablet 2   pantoprazole (PROTONIX) 40 MG tablet Take 1 tablet (40 mg total) by mouth daily before breakfast. 30 tablet 0   rosuvastatin (CRESTOR) 20 MG tablet TAKE 1 TABLET BY MOUTH AT BEDTIME. SCHEDULE PHYSICAL EXAM 30 tablet 0   tamsulosin (FLOMAX) 0.4 MG CAPS capsule TAKE 1 CAPSULE  BY MOUTH EVERY DAY 90 capsule 1   torsemide (DEMADEX) 20 MG tablet Take 1 tablet (20 mg total) by mouth 2 (two) times daily. Please make overdue appt with Dr. Angelena Form before anymore refills. Thank you 2nd attempt 30 tablet 0   No current facility-administered medications for this visit.    Allergies:   Patient has no known allergies.    Social History:  The patient  reports that he quit smoking about 26 years ago. His smoking use included cigarettes. He has never used smokeless tobacco. He reports that he does not drink alcohol and does not use drugs.   Family History:  The patient's family history includes Cancer in his brother and father; Coronary artery disease in his brother; Diabetes in his brother and mother; Stroke in his mother.    ROS:  Please see the history of present illness. Otherwise, review of systems are positive for none.   All other systems are reviewed and negative.    PHYSICAL EXAM: VS:  There were no vitals taken for this visit. , BMI There is no height or weight on file to calculate BMI.   General: Well  developed, well nourished, NAD Skin: Warm, dry, intact  Head: Normocephalic, atraumatic, sclera non-icteric, no xanthomas, clear, moist mucus membranes. Neck: Negative for carotid bruits. No JVD Lungs:Clear to ausculation bilaterally. No wheezes, rales, or rhonchi. Breathing is unlabored. Cardiovascular: RRR with S1 S2. No murmurs, rubs, gallops, or LV heave appreciated. Abdomen: Soft, non-tender, non-distended with normoactive bowel sounds. No hepatomegaly, No rebound/guarding. No obvious abdominal masses. MSK: Strength and tone appear normal for age. 5/5 in all extremities Extremities: No edema. No clubbing or cyanosis. DP/PT pulses 2+ bilaterally Neuro: Alert and oriented. No focal deficits. No facial asymmetry. MAE spontaneously. Psych: Responds to questions appropriately with normal affect.     EKG:  EKG {ACTION; IS/IS QBH:[id number]} ordered today. The ekg ordered today demonstrates ***   Recent Labs: No results found for requested labs within last 8760 hours.    Lipid Panel    Component Value Date/Time   CHOL 138 11/12/2018 1528   TRIG 214.0 (H) 11/12/2018 1528   HDL 25.60 (L) 11/12/2018 1528   CHOLHDL 5 11/12/2018 1528   VLDL 42.8 (H) 11/12/2018 1528   LDLCALC 56 06/22/2017 0516   LDLDIRECT 80.0 11/12/2018 1528      Wt Readings from Last 3 Encounters:  04/05/21 204 lb (92.5 kg)  02/05/20 220 lb (99.8 kg)  12/24/19 217 lb (98.4 kg)    Other studies Reviewed: Additional studies/ records that were reviewed today include: ***. Review of the above records demonstrates: ***  Echocardiogram 08/29/2019:   1. Left ventricular ejection fraction, by visual estimation, is 55 to  60%. The left ventricle has normal function. There is mildly increased  left ventricular hypertrophy.   2. Abnormal septal motion consistent with post-operative status.   3. Elevated left atrial pressure.   4. Left ventricular diastolic parameters are consistent with Grade II  diastolic  dysfunction (pseudonormalization).   5. The left ventricle has no regional wall motion abnormalities.   6. Global right ventricle has mildly reduced systolic function.The right  ventricular size is normal. No increase in right ventricular wall  thickness.   7. Left atrial size was severely dilated.   8. Right atrial size was normal.   9. The mitral valve is normal in structure. Mild to moderate mitral valve  regurgitation.  10. The tricuspid valve is normal in structure. Tricuspid valve  regurgitation is  not demonstrated.  11. The aortic valve is normal in structure. Aortic valve regurgitation is  not visualized.  12. The pulmonic valve was grossly normal. Pulmonic valve regurgitation is  not visualized.  13. TR signal is inadequate for assessing pulmonary artery systolic  pressure.    ASSESSMENT AND PLAN:  1.  ***   Current medicines are reviewed at length with the patient today.  The patient {ACTIONS; HAS/DOES NOT HAVE:19233} concerns regarding medicines.  The following changes have been made:  {PLAN; NO CHANGE:13088:s}  Labs/ tests ordered today include: *** No orders of the defined types were placed in this encounter.    Disposition:   FU with *** in {gen number 7-12:197588} {Days to years:10300}  Signed, Kathyrn Drown, NP  04/05/2021 [facility] [place], [place], [place]  [address] Phone: [phone]; Fax: [phone]

## 2021-04-11 ENCOUNTER — Ambulatory Visit: Payer: Medicare Other | Admitting: Cardiology

## 2021-04-19 DIAGNOSIS — Z20822 Contact with and (suspected) exposure to covid-19: Secondary | ICD-10-CM | POA: Diagnosis not present

## 2021-04-26 ENCOUNTER — Other Ambulatory Visit: Payer: Self-pay | Admitting: *Deleted

## 2021-04-26 ENCOUNTER — Other Ambulatory Visit: Payer: Self-pay

## 2021-04-26 ENCOUNTER — Telehealth: Payer: Self-pay | Admitting: Endocrinology

## 2021-04-26 DIAGNOSIS — E119 Type 2 diabetes mellitus without complications: Secondary | ICD-10-CM

## 2021-04-26 DIAGNOSIS — Z794 Long term (current) use of insulin: Secondary | ICD-10-CM

## 2021-04-26 MED ORDER — ONETOUCH VERIO VI STRP: 1.0000 | ORAL_STRIP | Freq: Two times a day (BID) | 3 refills | Status: DC

## 2021-04-26 NOTE — Telephone Encounter (Signed)
MEDICATION: onetouch test strips  PHARMACY:   [facility] [facility] ([place]), [place] - [address] Phone:  [phone]  Fax:  [phone]      HAS THE PATIENT CONTACTED THEIR PHARMACY?  no  IS THIS A 90 DAY SUPPLY : yes  IS PATIENT OUT OF MEDICATION: yes  IF NOT; HOW MUCH IS LEFT:   LAST APPOINTMENT DATE: @6 /21/2022  NEXT APPOINTMENT DATE:@9 /13/2022  DO WE HAVE YOUR PERMISSION TO LEAVE A DETAILED MESSAGE?: yes  **Let patient know to contact pharmacy at the end of the day to make sure medication is ready. **  ** Please notify patient to allow 48-72 hours to process**  **Encourage patient to contact the pharmacy for refills or they can request refills through [facility]**

## 2021-04-27 ENCOUNTER — Other Ambulatory Visit: Payer: Self-pay

## 2021-04-27 ENCOUNTER — Telehealth: Payer: Self-pay

## 2021-04-27 DIAGNOSIS — E119 Type 2 diabetes mellitus without complications: Secondary | ICD-10-CM

## 2021-04-27 MED ORDER — ONETOUCH VERIO VI STRP: 1.0000 | ORAL_STRIP | Freq: Two times a day (BID) | 3 refills | Status: DC

## 2021-04-27 MED ORDER — ONETOUCH ULTRASOFT LANCETS MISC: 12 refills | Status: DC

## 2021-04-27 NOTE — Telephone Encounter (Signed)
Received referral for Lantus cost assistance, however pt is not currently seeing an active PCP at our clinic. Will forward to care guide to assist since pt is not CCM eligible until he establishes care with a new provider.

## 2021-04-27 NOTE — Patient Outreach (Signed)
[facility]) Care Management  04/27/2021  [place] 09-Jun-1941 [id number]  Referral for medication assistance from Johny Shock, RN sent to [facility].  [name] [facility] Management Assistant [phone]

## 2021-05-02 NOTE — Patient Instructions (Signed)
Goals Addressed             This Visit's Progress    (THN)Lifestyle Change-Hypertension   Not on track    Timeframe:  Long-Range Goal Priority:  Medium Start Date: [id number]                            Expected End Date:   [id number]    Follow up [id number]                  - agree to work together to make changes - learn about high blood pressure    Why is this important?   The changes that you are asked to make may be hard to do.  This is especially true when the changes are life-long.  Knowing why it is important to you is the first step.  Working on the change with your family or support person helps you not feel alone.  Reward yourself and family or support person when goals are met. This can be an activity you choose like bowling, hiking, biking, swimming or shooting hoops.     Notes:  [id number] Patient has not started an exercise routine     [facility] and Keep All Appointments   On track    Timeframe:  Long-Range Goal Priority:  High Start Date:     [id number]                        Expected End Date:       [id number]               Follow up [id number]   - call to cancel if needed - keep a calendar with prescription refill dates - keep a calendar with appointment dates    Why is this important?   Part of staying healthy is seeing the doctor for follow-up care.  If you forget your appointments, there are some things you can do to stay on track.    Notes:  [id number] Patient is keeping appointments     (THN)Track and Manage My Blood Pressure-Hypertension   Not on track    Timeframe:  Short-Term Goal Priority:  High Start Date:      [id number]                       Expected End Date:12302022                      Follow up visit [id number]   - check blood pressure weekly - choose a place to take my blood pressure (home, clinic or office, retail store) - write blood pressure results in a log or diary    Why is this important?   You won't feel high blood pressure, but it can  still hurt your blood vessels.  High blood pressure can cause heart or kidney problems. It can also cause a stroke.  Making lifestyle changes like losing a little weight or eating less salt will help.  Checking your blood pressure at home and at different times of the day can help to control blood pressure.  If the doctor prescribes medicine remember to take it the way the doctor ordered.  Call the office if you cannot afford the medicine or if there are questions about it.     Notes:  [id number] Patient has been having BP taken at the Dr office  only. Not following up at home.

## 2021-05-02 NOTE — Patient Outreach (Signed)
[facility]) Care Management  [facility] Care Manager  04/26/2021 Late entry   [place] [birth year] [phone]  [place] telephone call to patient.  [employer] compliance verified. Per Patient he has not been checking his blood pressure at home. Patient stated he does not have any swelling in his legs. His A1C is elevating to 10.9. Patient stated he has not checked his blood sugar or blood pressure today. Per patient he is paying @ $300.00 for his lantus. He has not started an exercise routine. Patient has agreed to further outreach calls.   Encounter Medications:  Outpatient Encounter Medications as of 04/26/2021  Medication Sig   amLODipine (NORVASC) 5 MG tablet Take 1 tablet (5 mg total) by mouth daily.   apixaban (ELIQUIS) 5 MG TABS tablet Take 5 mg by mouth 2 (two) times daily.   Cyanocobalamin 1000 MCG CAPS Take by mouth.   fenofibrate micronized (LOFIBRA) 67 MG capsule TAKE 1 CAPSULE BY MOUTH DAILY BEFORE BREAKFAST. SCHEDULE PHYSICAL   ferrous sulfate 325 (65 FE) MG tablet Take 1 tablet (325 mg total) by mouth daily.   hydrALAZINE (APRESOLINE) 100 MG tablet Take 1 tablet (100 mg total) by mouth 3 (three) times daily. Please make overdue appt with Dr. Angelena Form before anymore refills. Thank you 2nd attempt   insulin glargine (LANTUS SOLOSTAR) 100 UNIT/ML Solostar Pen Inject 60 Units into the skin every morning.   isosorbide mononitrate (IMDUR) 60 MG 24 hr tablet TAKE 1 TABLET BY MOUTH EVERY DAY   metoprolol tartrate (LOPRESSOR) 25 MG tablet TAKE 1 AND 1/2 TABLETS BY MOUTH TWICE A DAY NEEDS APPT FOR REFILLS   pantoprazole (PROTONIX) 40 MG tablet Take 1 tablet (40 mg total) by mouth daily before breakfast.   rosuvastatin (CRESTOR) 20 MG tablet TAKE 1 TABLET BY MOUTH AT BEDTIME. SCHEDULE PHYSICAL EXAM   tamsulosin (FLOMAX) 0.4 MG CAPS capsule TAKE 1 CAPSULE BY MOUTH EVERY DAY   torsemide (DEMADEX) 20 MG tablet Take 1 tablet (20 mg total) by mouth 2 (two) times  daily. Please make overdue appt with Dr. Angelena Form before anymore refills. Thank you 2nd attempt   [DISCONTINUED] glucose blood (ONETOUCH VERIO) test strip 1 each by Other route in the morning and at bedtime. And lancets 2/day   [DISCONTINUED] glucose blood (ONETOUCH VERIO) test strip 1 each by Other route in the morning and at bedtime. And lancets 2/day   [DISCONTINUED] glucose blood (ONETOUCH VERIO) test strip 1 each by Other route in the morning and at bedtime. And lancets 2/day   No facility-administered encounter medications on file as of 04/26/2021.    Functional Status:  No flowsheet data found.  Fall/Depression Screening: Fall Risk  11/19/2018 08/07/2018 07/16/2018  Falls in the past year? 1 Yes Yes  Number falls in past yr: 0 1 1  Injury with Fall? 0 Yes No  Risk for fall due to : History of fall(s);Impaired balance/gait;Impaired mobility - History of fall(s);Impaired balance/gait;Impaired mobility  Follow up Falls evaluation completed - Falls evaluation completed;Falls prevention discussed;Education provided   [facility] 2/9 Scores 09/22/2020 11/19/2018 07/16/2018 12/19/2017 12/05/2017 11/16/2017 09/13/2017  PHQ - 2 Score 0 0 0 0 0 0 0    Assessment:   Care Plan Care Plan : Hypertension (Adult)  Updates made by Verlin Grills, RN since 05/02/2021 12:00 AM     Problem: Hypertension (Hypertension)   Priority: High  Onset Date: 09/22/2020     Goal: Hypertension Monitored   Start Date: 04/26/2020  Expected End Date:  10/14/2021  This Visit's Progress: Not on track  Priority: High  Note:   Evidence-based guidance:  Promote initial use of ambulatory blood pressure measurements (for 3 days) to rule out "white-coat" effect; identify masked hypertension and presence or absence of nocturnal "dipping" of blood pressure.   Encourage continued use of home blood pressure monitoring and recording in blood pressure log; include symptoms of hypotension or potential medication side effects in log.   Review blood pressure measurements taken inside and outside of the provider office; establish baseline and monitor trends; compare to target ranges or patient goal.  Share overall cardiovascular risk with patient; encourage changes to lifestyle risk factors, including alcohol consumption, smoking, inadequate exercise, poor dietary habits and stress.   Notes:  [id number] Patient is not monitoring BP at home    Task: Identify and Monitor Blood Pressure Elevation   Due Date: 10/14/2021  Note:   Care Management Activities:    - blood pressure equipment and technique reviewed - home or ambulatory blood pressure monitoring encouraged    Notes:     Problem: Disease Progression (Hypertension)   Priority: Medium     Long-Range Goal: Disease Progression Prevented or Minimized   Start Date: 09/22/2020  Expected End Date: 10/14/2021  This Visit's Progress: On track  Priority: Medium  Note:   Evidence-based guidance:  Tailor lifestyle advice to individual; review progress regularly; give frequent encouragement and respond positively to incremental successes.  Assess for and promote awareness of worsening disease or development of comorbidity.  Prepare patient for laboratory and diagnostic exams based on risk and presentation.  Prepare patient for use of pharmacologic therapy that may include diuretic, beta-blocker, beta-blocker/thiazide combination, angiotensin-converting enzyme inhibitor, renin-angiotensin blocker or calcium-channel blocker.  Expect periodic adjustments to pharmacologic therapy; manage side effects.  Promote a healthy diet that includes primarily plant-based foods, such as fruits, vegetables, whole grains, beans and legumes, low-fat dairy and lean meats.   Consider moderate reduction in sodium intake by avoiding the addition of salt to prepared foods and limiting processed meats, canned soup, frozen meals and salty snacks.   Promote a regular, daily exercise goal of 150 minutes  per week of moderate exercise based on tolerance, ability and patient choice; consider referral to physical therapist, community wellness and/or activity program.  Encourage the avoidance of no more than 2 hours per day of sedentary activity, such as recreational screen time.  Review sources of stress; explore current coping strategies and encourage use of mindfulness, yoga, meditation or exercise to manage stress.   Notes:     Task: Alleviate Barriers to Hypertension Treatment   Due Date: 10/14/2021  Note:   Care Management Activities:    - healthy diet promoted - medication side effects managed - quality of sleep assessed - reduction of dietary sodium encouraged - reduction in sedentary activities encouraged    Notes:     Problem: Resistant Hypertension (Hypertension)   Priority: Medium     Long-Range Goal: Response to Treatment Maximized   Start Date: 09/22/2020  Expected End Date: 10/14/2021  Priority: Medium  Note:   Evidence-based guidance:  Assess patient response to treatment, including presence or absence of medication side effects, degree of blood pressure control and patient satisfaction.  Assess technique (including cuff size and placement), measurement times, condition and calibration of blood pressure cuff set (both at-home and in-office equipment).  Assess factors that may influence response to treatment, including nonadherence to pharmacologic treatment plan, diet or activity changes and/or presence of pain, stress  or sleep disturbance.  Screen for signs and symptoms of depression; if present, refer for or complete a comprehensive assessment.  Evaluate social and economic barriers that may affect adherence to treatment plan  Address pharmacologic nonadherence by simplifying dosing regimen, counseling or support by pharmacist, financial assistance, self-monitoring of blood pressure, use of motivational interviewing, voice or text messages.  Encourage behavioral  adherence strategies, like habit-based interventions that link medication taking with existing daily routines.  Assess barriers to regular, daily physical activity; support family or support person-oriented activity changes and utilization of community activity or sports program.  Address barriers to dietary changes, especially sodium restriction, with referrals to community programs, like cooking classes, meal services or intensive education when available.  Refer to community-based peer support program or nurse home-visiting program.  Assess for chronic pain; when present add additional goals (Chronic Pain Care Plan Guide) as needed.  Provide frequent follow-up by telephone, telemonitoring, patient-practice portal or with home visit.  Review alcohol use screen; address using brief intervention beginning with risk that interferes with blood pressure control; refer for treatment when excessive alcohol use is noted.  Screen for obstructive sleep apnea; prepare patient for polysomnography based on risk and presentation and use of noninvasive ventilation to relieve obstructive sleep apnea when present.   Notes:     Task: Facilitate Adherence to Lifestyle Change   Due Date: 10/14/2021  Note:   Care Management Activities:    - home blood pressure monitoring technique reviewed - medication adherence assessment completed - resources needed to manage barriers to lifestyle change identified - success praised - support and encouragement provided    Notes:       Goals Addressed             This Visit's Progress    (THN)Lifestyle Change-Hypertension   Not on track    Timeframe:  Long-Range Goal Priority:  Medium Start Date: [id number]                            Expected End Date:   91791505    Follow up [id number]                  - agree to work together to make changes - learn about high blood pressure    Why is this important?   The changes that you are asked to make may be hard to do.   This is especially true when the changes are life-long.  Knowing why it is important to you is the first step.  Working on the change with your family or support person helps you not feel alone.  Reward yourself and family or support person when goals are met. This can be an activity you choose like bowling, hiking, biking, swimming or shooting hoops.     Notes:  [id number] Patient has not started an exercise routine     [facility] and Keep All Appointments   On track    Timeframe:  Long-Range Goal Priority:  High Start Date:     [id number]                        Expected End Date:       49201007               Follow up [id number]   - call to cancel if needed - keep a calendar with prescription refill dates - keep a calendar with  appointment dates    Why is this important?   Part of staying healthy is seeing the doctor for follow-up care.  If you forget your appointments, there are some things you can do to stay on track.    Notes:  [id number] Patient is keeping appointments     (THN)Track and Manage My Blood Pressure-Hypertension   Not on track    Timeframe:  Short-Term Goal Priority:  High Start Date:      [id number]                       Expected End Date:12302022                      Follow up visit [id number]   - check blood pressure weekly - choose a place to take my blood pressure (home, clinic or office, retail store) - write blood pressure results in a log or diary    Why is this important?   You won't feel high blood pressure, but it can still hurt your blood vessels.  High blood pressure can cause heart or kidney problems. It can also cause a stroke.  Making lifestyle changes like losing a little weight or eating less salt will help.  Checking your blood pressure at home and at different times of the day can help to control blood pressure.  If the doctor prescribes medicine remember to take it the way the doctor ordered.  Call the office if you cannot afford the medicine or  if there are questions about it.     Notes:  [id number] Patient has been having BP taken at the Dr office only. Not following up at home.        Plan:  Follow-up: Patient agrees to Care Plan and Follow-up. RN sent patient educational information on diabetes Referred to pharmacy RN encouraged patient to monitor Blood sugar and Blood pressure RN discussed exercising RN encouraged patient to get eye exam RN sent update assessment to PCP RN will follow up within the month of October  Jatziry Wechter [facility] Management [phone]

## 2021-06-05 ENCOUNTER — Other Ambulatory Visit: Payer: Self-pay | Admitting: Physician Assistant

## 2021-06-06 ENCOUNTER — Telehealth: Payer: Self-pay | Admitting: Cardiovascular Disease

## 2021-06-06 ENCOUNTER — Other Ambulatory Visit: Payer: Self-pay

## 2021-06-06 MED ORDER — AMLODIPINE BESYLATE 5 MG PO TABS: 5.0000 mg | ORAL_TABLET | Freq: Every day | ORAL | 1 refills | Status: DC

## 2021-06-06 MED ORDER — TORSEMIDE 20 MG PO TABS: 20.0000 mg | ORAL_TABLET | Freq: Two times a day (BID) | ORAL | 1 refills | Status: DC

## 2021-06-06 MED ORDER — ISOSORBIDE MONONITRATE ER 60 MG PO TB24: 60.0000 mg | ORAL_TABLET | Freq: Every day | ORAL | 1 refills | Status: DC

## 2021-06-06 NOTE — Telephone Encounter (Signed)
*  STAT* If patient is at the pharmacy, call can be transferred to refill team.   1. Which medications need to be refilled? (please list name of each medication and dose if known)  torsemide (DEMADEX) 20 MG tablet isosorbide mononitrate (IMDUR) 60 MG 24 hr tablet amLODipine (NORVASC) 5 MG tablet  2. Which pharmacy/location (including street and city if local pharmacy) is medication to be sent to? [id number] [place], [place] - [address]  3. Do they need a 30 day or 90 day supply? 90 with refills  Patient is out of medication.  Patient is scheduled to see Dr. Angelena Form 11/21/21

## 2021-06-07 ENCOUNTER — Other Ambulatory Visit: Payer: Self-pay | Admitting: Internal Medicine

## 2021-06-14 NOTE — Telephone Encounter (Signed)
Refill request Crestor Last office visit 09/08/19 Last refill 01/24/21 #30 Upcoming appointment 06/27/21 Romilda Garret NP

## 2021-06-20 DIAGNOSIS — Z20822 Contact with and (suspected) exposure to covid-19: Secondary | ICD-10-CM | POA: Diagnosis not present

## 2021-06-27 ENCOUNTER — Ambulatory Visit (INDEPENDENT_AMBULATORY_CARE_PROVIDER_SITE_OTHER)
Admission: RE | Admit: 2021-06-27 | Discharge: 2021-06-27 | Disposition: A | Discharge status: Home / Self Care | Payer: Medicare Other | Source: Ambulatory Visit | Attending: Nurse Practitioner | Admitting: Nurse Practitioner

## 2021-06-27 ENCOUNTER — Other Ambulatory Visit: Payer: Self-pay

## 2021-06-27 ENCOUNTER — Ambulatory Visit (INDEPENDENT_AMBULATORY_CARE_PROVIDER_SITE_OTHER): Payer: Medicare Other | Admitting: Nurse Practitioner

## 2021-06-27 ENCOUNTER — Encounter: Payer: Self-pay | Admitting: Nurse Practitioner

## 2021-06-27 VITALS — BP 146/52 | HR 52 | Temp 97.9°F | Resp 18 | Ht 67.0 in | Wt 219.2 lb

## 2021-06-27 DIAGNOSIS — I10 Essential (primary) hypertension: Secondary | ICD-10-CM | POA: Diagnosis not present

## 2021-06-27 DIAGNOSIS — E782 Mixed hyperlipidemia: Secondary | ICD-10-CM | POA: Diagnosis not present

## 2021-06-27 DIAGNOSIS — N184 Chronic kidney disease, stage 4 (severe): Secondary | ICD-10-CM | POA: Diagnosis not present

## 2021-06-27 DIAGNOSIS — I5032 Chronic diastolic (congestive) heart failure: Secondary | ICD-10-CM | POA: Diagnosis not present

## 2021-06-27 DIAGNOSIS — R0602 Shortness of breath: Secondary | ICD-10-CM | POA: Diagnosis not present

## 2021-06-27 DIAGNOSIS — R351 Nocturia: Secondary | ICD-10-CM

## 2021-06-27 DIAGNOSIS — Z23 Encounter for immunization: Secondary | ICD-10-CM

## 2021-06-27 DIAGNOSIS — I480 Paroxysmal atrial fibrillation: Secondary | ICD-10-CM | POA: Diagnosis not present

## 2021-06-27 DIAGNOSIS — R0600 Dyspnea, unspecified: Secondary | ICD-10-CM | POA: Insufficient documentation

## 2021-06-27 DIAGNOSIS — L0291 Cutaneous abscess, unspecified: Secondary | ICD-10-CM

## 2021-06-27 DIAGNOSIS — Z862 Personal history of diseases of the blood and blood-forming organs and certain disorders involving the immune mechanism: Secondary | ICD-10-CM | POA: Diagnosis not present

## 2021-06-27 MED ORDER — DOXYCYCLINE HYCLATE 100 MG PO TABS: 100.0000 mg | ORAL_TABLET | Freq: Two times a day (BID) | ORAL | 0 refills | Status: AC

## 2021-06-27 NOTE — Assessment & Plan Note (Signed)
Patient unsure of timeline of abscess to left lateral lower neck.  Went to take a look at it in office when I touched started draining.  Did help express the rest of the purulent drainage.  Patient tolerated it well no need for I&D in office.  We will start patient on antibiotics doxycycline 100 mg twice daily for 7 days.  No drug allergies but does have poor kidney function.  Continue to monitor

## 2021-06-27 NOTE — Assessment & Plan Note (Signed)
History of frequent nighttime urination.  Can be 30 minutes to 2 hours in between episodes.  PSA has not been checked an extended period of time we will check PSA today.

## 2021-06-27 NOTE — Assessment & Plan Note (Signed)
History of anemia presumably to chronic blood loss.  We will check iron studies today.  Pending lab results.

## 2021-06-27 NOTE — Patient Instructions (Signed)
Nice to see you today. Will see you in approx 3-4 months after we move back. I will be in touch in regards to the labs today

## 2021-06-27 NOTE — Assessment & Plan Note (Signed)
Currently maintained on rosuvastatin and was on fenofibrate.  Patient states that he has been out of the fenofibrate.  We will check labs, pending results.  Continue rosuvastatin hold off on fenofibrate currently.

## 2021-06-27 NOTE — Assessment & Plan Note (Signed)
States he has noticed this over the past 2 to 3 months.  No causative factor.  Does have history of CHF.  Does have edema bilateral lower extremities.  Obtain EKG in office and chest x-ray.  Pending lab results.

## 2021-06-27 NOTE — Assessment & Plan Note (Signed)
Patient currently maintained on amlodipine, hydralazine, isosorbide mononitrate, metoprolol.  Does not check his blood pressures at home frequently requested the patient check blood pressures 3 times a week.  States he is never had trouble with low blood pressure.  Blood pressure slightly above goal systolically. Continue amlodipine, hydralazine, isosorbide mononitrate, and metoprolol as prescribed.

## 2021-06-27 NOTE — Assessment & Plan Note (Signed)
Patient has history of paroxysmal atrial fibrillation.  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  We will need to touch with cardiology to see recommendation.

## 2021-06-27 NOTE — Progress Notes (Signed)
Established Patient Office Visit  Subjective:  Patient ID: Jake Gross, male    DOB: January 13, 1941  Age: 80 y.o. MRN: [id number]  CC:  Chief Complaint  Patient presents with   Transfer of Care    HPI [place] presents for Transfer of care  Other providers: Dr. Jacqualine Code (EP), Dr. Lauree Chandler (cards) GI?  HTN: Used to check his blood pressure every morning. States as of late hhas not.I mentioned for him to check it 3 times weekly  CHF: Seen by Dr. Angelena Form. On long acting nitrate, BB, and torsemide. See Cardiology once a year.  Afib: hx of paroxysmal. NSR in office today. Has not been taking eliquis. Last pick up was 2020.  DM 2: Seen by Endocrine has appointment with them tomorrow. States he checks glucose as asked.  CKD: History of the same.  SHOB: out of breath just sitting there. States approx 2-3 months. No pain. Is able to sleep flat per his report. Does have edema in lower extremities.  Abscess: unsure how long it has been there. States it was twice the size it is now. No fever or chills per patient report. States he has noticed that it was leaking. 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   CAD (coronary artery disease) 09/29/2019   Cervical spondylosis    Chronic anticoagulation    Chronic diastolic CHF (congestive heart failure) ([place]) 12/17/2018   CKD (chronic kidney disease) 12/17/2018   Diabetes mellitus    History of GI bleed 09/29/2019   Hyperlipidemia    Hypertension    Iron deficiency anemia due to chronic blood loss    Paroxysmal atrial fibrillation ([place]) 12/17/2018   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 NO ISCHEMIA   COLONOSCOPY W/ POLYPECTOMY     5 polyps   COLONOSCOPY WITH PROPOFOL N/A 08/29/2019   Procedure: COLONOSCOPY WITH PROPOFOL;  Surgeon: Doran Stabler, MD;  Location: [place];  Service: Gastroenterology;  Laterality: N/A;   CORONARY ARTERY BYPASS GRAFT N/A 11/26/2018   Procedure: CORONARY ARTERY BYPASS GRAFTING (CABG), ON PUMP, TIMES FOUR, USING LEFT INTERNAL MAMMARY ARTERY AND ENDOSCOPICALLY HARVESTED LEFT SAPHENOUS VEIN;  Surgeon: Gaye Pollack, MD;  Location: [place];  Service: Open Heart Surgery;  Laterality: N/A;   ESOPHAGOGASTRODUODENOSCOPY (EGD) WITH PROPOFOL N/A 08/29/2019   Procedure: ESOPHAGOGASTRODUODENOSCOPY (EGD) WITH PROPOFOL;  Surgeon: Doran Stabler, MD;  Location: [place];  Service: Gastroenterology;  Laterality: N/A;   EUS N/A 03/11/2015   Procedure: LOWER ENDOSCOPIC ULTRASOUND (EUS);  Surgeon: Milus Banister, MD;  Location[facility];  Service: Endoscopy;  Laterality: N/A;   FLEXIBLE SIGMOIDOSCOPY N/A 02/02/2015   Procedure: FLEXIBLE SIGMOIDOSCOPY;  Surgeon: Inda Castle, MD;  [facility];  Service: Endoscopy;  Laterality: N/A;  ERBE   HEMOSTASIS CLIP PLACEMENT  08/29/2019   Procedure: HEMOSTASIS CLIP PLACEMENT;  Surgeon: Doran Stabler, MD;  Location: [place];  Service: Gastroenterology;;  HOT HEMOSTASIS N/A 08/29/2019   Procedure: HOT HEMOSTASIS (ARGON PLASMA COAGULATION/BICAP);  Surgeon: Doran Stabler, MD;  Location: [place];  Service: Gastroenterology;  Laterality: N/A;   LEFT HEART CATH AND CORONARY ANGIOGRAPHY N/A 11/20/2018   Procedure: LEFT HEART CATH AND CORONARY ANGIOGRAPHY;  Surgeon: Burnell Blanks, MD;  Location: [place] [facility];  Service: Cardiovascular;  Laterality: N/A;   LOOP RECORDER INSERTION N/A 08/14/2017   Procedure:  LOOP RECORDER INSERTION;  Surgeon: Thompson Grayer, MD;  Location: [place] [facility];  Service: Cardiovascular;  Laterality: N/A;   PARTIAL PROCTECTOMY BY TEM N/A 04/08/2015   Procedure: TEM PARTIAL PROCTECTOMY OF RECTAL MASS;  Surgeon: Michael Boston, MD;  [facility];  Service: General;  Laterality: N/A;   POLYPECTOMY  08/29/2019   Procedure: POLYPECTOMY;  Surgeon: Doran Stabler, MD;  Location: [facility];  Service: Gastroenterology;;   TEE WITHOUT CARDIOVERSION N/A 06/25/2017   Procedure: TRANSESOPHAGEAL ECHOCARDIOGRAM (TEE);  Surgeon: Skeet Latch, MD;  Location: [place];  Service: Cardiovascular;  Laterality: N/A;   TEE WITHOUT CARDIOVERSION N/A 11/26/2018   Procedure: TRANSESOPHAGEAL ECHOCARDIOGRAM (TEE);  Surgeon: Gaye Pollack, MD;  Location: [place];  Service: Open Heart Surgery;  Laterality: N/A;   TONSILLECTOMY AND ADENOIDECTOMY     as child    Family History  Problem Relation Age of Onset   Cancer Father        "all over"   Coronary artery disease Brother    Diabetes Brother    Stroke Mother    Diabetes Mother    Cancer Brother    Colon cancer Neg Hx    Esophageal cancer Neg Hx    Rectal cancer Neg Hx    Stomach cancer Neg Hx     Social History   Socioeconomic History   Marital status: Divorced    Spouse name: Not on file   Number of children: 2   Years of education: Not on file   Highest education level: Not on file  Occupational History   Occupation: retired    Fish farm manager: [employer]  Tobacco Use   Smoking status: Former    Types: Cigarettes    Quit date: 08/13/1994    Years since quitting: 26.8   Smokeless tobacco: Never  Vaping Use   Vaping Use: Never used  Substance and Sexual Activity   Alcohol use: No    Alcohol/week: 0.0 standard drinks   Drug use: No   Sexual activity: Not Currently    Birth control/protection: None  Other Topics Concern   Not on file  Social History Narrative   Not on file   Social Determinants of Health    Financial Resource Strain: Not on file  Food Insecurity: No Food Insecurity   Worried About Running Out of Food in the Last Year: Never true   [place] in the Last Year: Never true  Transportation Needs: No Transportation Needs   Lack of Transportation (Medical): No   Lack of Transportation (Non-Medical): No  Physical Activity: Not on file  Stress: Not on file  Social Connections: Not on file  Intimate Partner Violence: Not on file    Outpatient Medications Prior to Visit  Medication Sig Dispense Refill   amLODipine (NORVASC) 5 MG tablet Take 1 tablet (5 mg total) by mouth daily. Pt needs to keep upcoming appt in Feb, 2023 for further refills 90 tablet 1   glucose blood (ONETOUCH VERIO) test strip 1 each by Other route in the morning  and at bedtime. And lancets 2/day 100 each 3   hydrALAZINE (APRESOLINE) 100 MG tablet Take 1 tablet (100 mg total) by mouth 3 (three) times daily. Patient must keep 11/21/2021 appointment for further refills 270 tablet 1   insulin glargine (LANTUS SOLOSTAR) 100 UNIT/ML Solostar Pen Inject 60 Units into the skin every morning. 60 mL 3   isosorbide mononitrate (IMDUR) 60 MG 24 hr tablet Take 1 tablet (60 mg total) by mouth daily. Pt needs to keep upcoming appt in Feb, 2023 for further refills 90 tablet 1   Lancets (ONETOUCH ULTRASOFT) lancets Use as instructed 100 each 12   metoprolol tartrate (LOPRESSOR) 25 MG tablet TAKE 1 AND 1/2 TABLETS BY MOUTH TWICE A DAY NEEDS APPT FOR REFILLS 270 tablet 2   rosuvastatin (CRESTOR) 20 MG tablet TAKE 1 TABLET BY MOUTH AT BEDTIME. SCHEDULE PHYSICAL EXAM 30 tablet 0   tamsulosin (FLOMAX) 0.4 MG CAPS capsule TAKE 1 CAPSULE BY MOUTH EVERY DAY 90 capsule 1   torsemide (DEMADEX) 20 MG tablet Take 1 tablet (20 mg total) by mouth 2 (two) times daily. Pt needs to keep upcoming appt in Feb, 2023 for further refills 180 tablet 1   apixaban (ELIQUIS) 5 MG TABS tablet Take 5 mg by mouth 2 (two) times daily.     fenofibrate  micronized (LOFIBRA) 67 MG capsule TAKE 1 CAPSULE BY MOUTH DAILY BEFORE BREAKFAST. SCHEDULE PHYSICAL 90 capsule 0   pantoprazole (PROTONIX) 40 MG tablet Take 1 tablet (40 mg total) by mouth daily before breakfast. 30 tablet 0   Cyanocobalamin 1000 MCG CAPS Take by mouth. (Patient not taking: Reported on 06/27/2021)     ferrous sulfate 325 (65 FE) MG tablet Take 1 tablet (325 mg total) by mouth daily. 30 tablet 0   No facility-administered medications prior to visit.    No Known Allergies  ROS Review of Systems  Constitutional:  Negative for chills and fever.  Respiratory:  Positive for shortness of breath. Negative for wheezing.   Cardiovascular:  Positive for leg swelling. Negative for chest pain and palpitations.  Gastrointestinal:  Negative for abdominal pain, blood in stool, constipation, diarrhea, nausea and vomiting.  Genitourinary:  Negative for dysuria.  Skin:  Positive for wound (abscess).  Neurological:  Negative for weakness.     Objective:    Physical Exam Vitals and nursing note reviewed.  Constitutional:      Appearance: He is obese.  Neck:     Vascular: No carotid bruit.  Cardiovascular:     Rate and Rhythm: Regular rhythm. Bradycardia present.  Pulmonary:     Effort: Pulmonary effort is normal.     Breath sounds: Normal breath sounds.  Abdominal:     General: Bowel sounds are normal.  Musculoskeletal:     Right lower leg: 2+ Pitting Edema present.     Left lower leg: 1+ Pitting Edema present.  Lymphadenopathy:     Cervical: No cervical adenopathy.  Skin:    General: Skin is warm.       Neurological:     Mental Status: He is alert.  Psychiatric:        Mood and Affect: Mood normal.        Behavior: Behavior normal.        Thought Content: Thought content normal.        Judgment: Judgment normal.    BP (!) 146/52   Pulse (!) 52   Temp 97.9 F (36.6 C)   Resp 18   Ht 5'  7" (1.702 m)   Wt 219 lb 4 oz (99.5 kg)   SpO2 96%   BMI 34.34 kg/m  Wt  Readings from Last 3 Encounters:  06/27/21 219 lb 4 oz (99.5 kg)  04/05/21 204 lb (92.5 kg)  02/05/20 220 lb (99.8 kg)     Health Maintenance Due  Topic Date Due   Hepatitis C Screening  Never done   Zoster Vaccines- Shingrix (1 of 2) Never done   PNA vac Low Risk Adult (1 of 2 - PCV13) Never done   OPHTHALMOLOGY EXAM  01/14/2018   COVID-19 Vaccine (4 - Booster for Pfizer series) 12/24/2020    There are no preventive care reminders to display for this patient.  Lab Results  Component Value Date   TSH 1.445 08/27/2019   Lab Results  Component Value Date   WBC 6.9 09/26/2019   HGB 10.0 (L) 09/26/2019   HCT 33.3 (L) 09/26/2019   MCV 82 09/26/2019   PLT 229 09/26/2019   Lab Results  Component Value Date   NA 143 10/27/2019   K 5.1 10/27/2019   CO2 22 10/27/2019   GLUCOSE 156 (H) 10/27/2019   BUN 54 (H) 10/27/2019   CREATININE 2.24 (H) 10/27/2019   BILITOT 0.6 09/08/2019   ALKPHOS 81 09/08/2019   AST 17 09/08/2019   ALT 7 09/08/2019   PROT 6.7 09/08/2019   ALBUMIN 3.3 (L) 09/08/2019   CALCIUM 9.0 10/27/2019   ANIONGAP 13 08/30/2019   GFR 20.97 (L) 09/08/2019   Lab Results  Component Value Date   CHOL 138 11/12/2018   Lab Results  Component Value Date   HDL 25.60 (L) 11/12/2018   Lab Results  Component Value Date   LDLCALC 56 06/22/2017   Lab Results  Component Value Date   TRIG 214.0 (H) 11/12/2018   Lab Results  Component Value Date   CHOLHDL 5 11/12/2018   Lab Results  Component Value Date   HGBA1C 10.9 (A) 04/05/2021      Assessment & Plan:   Problem List Items Addressed This Visit       Cardiovascular and Mediastinum   Essential hypertension    Patient currently maintained on amlodipine, hydralazine, isosorbide mononitrate, metoprolol.  Does not check his blood pressures at home frequently requested the patient check blood pressures 3 times a week.  States he is never had trouble with low blood pressure.  Blood pressure slightly above  goal systolically. Continue amlodipine, hydralazine, isosorbide mononitrate, and metoprolol as prescribed.      Chronic diastolic CHF (congestive heart failure) ([place])    Managed by cardiology Dr. Angelena Form.  Continue taking medications as prescribed and follow-up with clinic as scheduled.      Paroxysmal atrial fibrillation [facility])    Patient has history of paroxysmal atrial fibrillation.  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  We will need to touch with cardiology to see recommendation.        Genitourinary   CKD (chronic kidney disease)     Other   HLD (hyperlipidemia)    Currently maintained on rosuvastatin and was on fenofibrate.  Patient states that he has been out of the fenofibrate.  We will check labs, pending results.  Continue rosuvastatin hold off on fenofibrate currently.      Relevant Orders   Lipid panel   Shortness of breath - Primary    States he has noticed this  over the past 2 to 3 months.  No causative factor.  Does have history of CHF.  Does have edema bilateral lower extremities.  Obtain EKG in office and chest x-ray.  Pending lab results.      Relevant Orders   CBC   Brain natriuretic peptide   Comprehensive metabolic panel   EKG 44-LPNP (Completed)   DG Chest 2 View   Abscess    Patient unsure of timeline of abscess to left lateral lower neck.  Went to take a look at it in office when I touched started draining.  Did help express the rest of the purulent drainage.  Patient tolerated it well no need for I&D in office.  We will start patient on antibiotics doxycycline 100 mg twice daily for 7 days.  No drug allergies but does have poor kidney function.  Continue to monitor      Relevant Medications   doxycycline (VIBRA-TABS) 100 MG tablet   History of anemia    History of anemia presumably to chronic blood loss.  We will check iron studies today.  Pending lab  results.      Relevant Orders   IBC + Ferritin   Vitamin B12   Nocturia    History of frequent nighttime urination.  Can be 30 minutes to 2 hours in between episodes.  PSA has not been checked an extended period of time we will check PSA today.      Relevant Orders   PSA   Other Visit Diagnoses     Need for influenza vaccination       Relevant Orders   Flu Vaccine QUAD High Dose(Fluad) (Completed)       No orders of the defined types were placed in this encounter.   Follow-up: Return in about 3 months (around 09/26/2021) for annual wellness.    This visit occurred during the SARS-CoV-2 public health emergency.  Safety protocols were in place, including screening questions prior to the visit, additional usage of staff PPE, and extensive cleaning of exam room while observing appropriate contact time as indicated for disinfecting solutions.       Signed,  Romilda Garret, NP    06/27/2021 10:29 AM     Romilda Garret, NP

## 2021-06-27 NOTE — Assessment & Plan Note (Signed)
Managed by cardiology Dr. Angelena Form.  Continue taking medications as prescribed and follow-up with clinic as scheduled.

## 2021-06-28 ENCOUNTER — Ambulatory Visit (INDEPENDENT_AMBULATORY_CARE_PROVIDER_SITE_OTHER): Payer: Medicare Other | Admitting: Endocrinology

## 2021-06-28 ENCOUNTER — Other Ambulatory Visit: Payer: Self-pay

## 2021-06-28 VITALS — BP 100/78 | HR 53 | Ht 67.0 in | Wt 221.6 lb

## 2021-06-28 DIAGNOSIS — I639 Cerebral infarction, unspecified: Secondary | ICD-10-CM | POA: Diagnosis not present

## 2021-06-28 DIAGNOSIS — Z794 Long term (current) use of insulin: Secondary | ICD-10-CM

## 2021-06-28 DIAGNOSIS — E119 Type 2 diabetes mellitus without complications: Secondary | ICD-10-CM

## 2021-06-28 LAB — POCT GLYCOSYLATED HEMOGLOBIN (HGB A1C): Hemoglobin A1C: 7.8 % — AB (ref 4.0–5.6)

## 2021-06-28 LAB — COMPREHENSIVE METABOLIC PANEL
AG Ratio: 1.1 (calc) (ref 1.0–2.5)
ALT: 12 U/L (ref 9–46)
AST: 19 U/L (ref 10–35)
Albumin: 3.4 g/dL — ABNORMAL LOW (ref 3.6–5.1)
Alkaline phosphatase (APISO): 84 U/L (ref 35–144)
BUN/Creatinine Ratio: 23 (calc) — ABNORMAL HIGH (ref 6–22)
BUN: 43 mg/dL — ABNORMAL HIGH (ref 7–25)
CO2: 22 mmol/L (ref 20–32)
Calcium: 9 mg/dL (ref 8.6–10.3)
Chloride: 111 mmol/L — ABNORMAL HIGH (ref 98–110)
Creat: 1.88 mg/dL — ABNORMAL HIGH (ref 0.70–1.28)
Globulin: 3.2 g/dL (calc) (ref 1.9–3.7)
Glucose, Bld: 84 mg/dL (ref 65–99)
Potassium: 5.2 mmol/L (ref 3.5–5.3)
Sodium: 144 mmol/L (ref 135–146)
Total Bilirubin: 0.6 mg/dL (ref 0.2–1.2)
Total Protein: 6.6 g/dL (ref 6.1–8.1)

## 2021-06-28 LAB — BRAIN NATRIURETIC PEPTIDE: Brain Natriuretic Peptide: 422 pg/mL — ABNORMAL HIGH (ref <100)

## 2021-06-28 NOTE — Progress Notes (Signed)
Subjective:    Patient ID: [employer], male    DOB: [birth year], 80 y.o.   MRN: [id number]  HPI Pt returns for f/u of diabetes mellitus:  DM type: Insulin-requiring type 2 DM.   Dx'ed: 6222 Complications: PN, DR, CVA, PAD, CAD, and stage 4 CRI.   Therapy: insulin since 2005 DKA: never Severe hypoglycemia: last episode was in 2014.   Pancreatitis: never.  Pancreatic imaging: normal CT in 2006 Other: he eats 2 meals per day; he takes QD insulin, after poor results with multiple daily injections; fructosamine has confirmed A1c.    Interval history: pt says he takes meds as rx'ed.  He says meter does not work.  pt states he feels well in general.  Pt says he cannot afford any more brand name meds.   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   CAD (coronary artery disease) 09/29/2019   Cervical spondylosis    Chronic anticoagulation    Chronic diastolic CHF (congestive heart failure) ([place]) 12/17/2018   CKD (chronic kidney disease) 12/17/2018   Diabetes mellitus    History of GI bleed 09/29/2019   Hyperlipidemia    Hypertension    Iron deficiency anemia due to chronic blood loss    Paroxysmal atrial fibrillation ([place]) 12/17/2018   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 NO ISCHEMIA   COLONOSCOPY W/ POLYPECTOMY     5 polyps   COLONOSCOPY WITH PROPOFOL N/A 08/29/2019   Procedure: COLONOSCOPY WITH PROPOFOL;  Surgeon: Doran Stabler, MD;  Location: [place];  Service: Gastroenterology;  Laterality: N/A;   CORONARY ARTERY BYPASS GRAFT  N/A 11/26/2018   Procedure: CORONARY ARTERY BYPASS GRAFTING (CABG), ON PUMP, TIMES FOUR, USING LEFT INTERNAL MAMMARY ARTERY AND ENDOSCOPICALLY HARVESTED LEFT SAPHENOUS VEIN;  Surgeon: Gaye Pollack, MD;  Location: [place];  Service: Open Heart Surgery;  Laterality: N/A;   ESOPHAGOGASTRODUODENOSCOPY (EGD) WITH PROPOFOL N/A 08/29/2019   Procedure: ESOPHAGOGASTRODUODENOSCOPY (EGD) WITH PROPOFOL;  Surgeon: Doran Stabler, MD;  Location: [place];  Service: Gastroenterology;  Laterality: N/A;   EUS N/A 03/11/2015   Procedure: LOWER ENDOSCOPIC ULTRASOUND (EUS);  Surgeon: Milus Banister, MD;  Location[facility];  Service: Endoscopy;  Laterality: N/A;   FLEXIBLE SIGMOIDOSCOPY N/A 02/02/2015   Procedure: FLEXIBLE SIGMOIDOSCOPY;  Surgeon: Inda Castle, MD;  [facility];  Service: Endoscopy;  Laterality: N/A;  ERBE   HEMOSTASIS CLIP PLACEMENT  08/29/2019   Procedure: HEMOSTASIS CLIP PLACEMENT;  Surgeon: Doran Stabler, MD;  Location: [place] [facility];  Service: Gastroenterology;;   HOT HEMOSTASIS N/A 08/29/2019   Procedure: HOT HEMOSTASIS (ARGON PLASMA COAGULATION/BICAP);  Surgeon: Doran Stabler, MD;  Location: [place];  Service: Gastroenterology;  Laterality: N/A;   LEFT HEART CATH AND CORONARY ANGIOGRAPHY N/A 11/20/2018   Procedure: LEFT HEART CATH AND CORONARY ANGIOGRAPHY;  Surgeon: Burnell Blanks, MD;  Location: [place] [facility];  Service: Cardiovascular;  Laterality: N/A;   LOOP RECORDER INSERTION N/A 08/14/2017   Procedure: LOOP RECORDER  INSERTION;  Surgeon: Thompson Grayer, MD;  Location: [place] [facility];  Service: Cardiovascular;  Laterality: N/A;   PARTIAL PROCTECTOMY BY TEM N/A 04/08/2015   Procedure: TEM PARTIAL PROCTECTOMY OF RECTAL MASS;  Surgeon: Michael Boston, MD;  [facility];  Service: General;  Laterality: N/A;   POLYPECTOMY  08/29/2019   Procedure: POLYPECTOMY;  Surgeon: Doran Stabler, MD;  Location: [facility] END[facility];  Service:  Gastroenterology;;   TEE WITHOUT CARDIOVERSION N/A 06/25/2017   Procedure: TRANSESOPHAGEAL ECHOCARDIOGRAM (TEE);  Surgeon: Skeet Latch, MD;  Location: [place];  Service: Cardiovascular;  Laterality: N/A;   TEE WITHOUT CARDIOVERSION N/A 11/26/2018   Procedure: TRANSESOPHAGEAL ECHOCARDIOGRAM (TEE);  Surgeon: Gaye Pollack, MD;  Location: [place];  Service: Open Heart Surgery;  Laterality: N/A;   TONSILLECTOMY AND ADENOIDECTOMY     as child    Social History   Socioeconomic History   Marital status: Divorced    Spouse name: Not on file   Number of children: 2   Years of education: Not on file   Highest education level: Not on file  Occupational History   Occupation: retired    Fish farm manager: [employer]  Tobacco Use   Smoking status: Former    Types: Cigarettes    Quit date: 08/13/1994    Years since quitting: 26.8   Smokeless tobacco: Never  Vaping Use   Vaping Use: Never used  Substance and Sexual Activity   Alcohol use: No    Alcohol/week: 0.0 standard drinks   Drug use: No   Sexual activity: Not Currently    Birth control/protection: None  Other Topics Concern   Not on file  Social History Narrative   Not on file   Social Determinants of Health   Financial Resource Strain: Not on file  Food Insecurity: No Food Insecurity   Worried About Charity fundraiser in the Last Year: Never true   [place] in the Last Year: Never true  Transportation Needs: No Transportation Needs   Lack of Transportation (Medical): No   Lack of Transportation (Non-Medical): No  Physical Activity: Not on file  Stress: Not on file  Social Connections: Not on file  Intimate Partner Violence: Not on file    Current Outpatient Medications on File Prior to Visit  Medication Sig Dispense Refill   amLODipine (NORVASC) 5 MG tablet Take 1 tablet (5 mg total) by mouth daily. Pt needs to keep upcoming appt in Feb, 2023 for further refills 90 tablet 1   apixaban (ELIQUIS) 5 MG TABS tablet Take 5  mg by mouth 2 (two) times daily.     doxycycline (VIBRA-TABS) 100 MG tablet Take 1 tablet (100 mg total) by mouth 2 (two) times daily for 7 days. 14 tablet 0   fenofibrate micronized (LOFIBRA) 67 MG capsule TAKE 1 CAPSULE BY MOUTH DAILY BEFORE BREAKFAST. SCHEDULE PHYSICAL 90 capsule 0   glucose blood (ONETOUCH VERIO) test strip 1 each by Other route in the morning and at bedtime. And lancets 2/day 100 each 3   hydrALAZINE (APRESOLINE) 100 MG tablet Take 1 tablet (100 mg total) by mouth 3 (three) times daily. Patient must keep 11/21/2021 appointment for further refills 270 tablet 1   insulin glargine (LANTUS SOLOSTAR) 100 UNIT/ML Solostar Pen Inject 60 Units into the skin every morning. 60 mL 3   isosorbide mononitrate (IMDUR) 60 MG 24 hr tablet Take 1 tablet (60 mg total) by mouth daily. Pt needs to keep upcoming appt in Feb, 2023 for  further refills 90 tablet 1   Lancets (ONETOUCH ULTRASOFT) lancets Use as instructed 100 each 12   metoprolol tartrate (LOPRESSOR) 25 MG tablet TAKE 1 AND 1/2 TABLETS BY MOUTH TWICE A DAY NEEDS APPT FOR REFILLS 270 tablet 2   pantoprazole (PROTONIX) 40 MG tablet Take 1 tablet (40 mg total) by mouth daily before breakfast. 30 tablet 0   rosuvastatin (CRESTOR) 20 MG tablet TAKE 1 TABLET BY MOUTH AT BEDTIME. SCHEDULE PHYSICAL EXAM 30 tablet 0   tamsulosin (FLOMAX) 0.4 MG CAPS capsule TAKE 1 CAPSULE BY MOUTH EVERY DAY 90 capsule 1   torsemide (DEMADEX) 20 MG tablet Take 1 tablet (20 mg total) by mouth 2 (two) times daily. Pt needs to keep upcoming appt in Feb, 2023 for further refills 180 tablet 1   No current facility-administered medications on file prior to visit.    No Known Allergies  Family History  Problem Relation Age of Onset   Cancer Father        "all over"   Coronary artery disease Brother    Diabetes Brother    Stroke Mother    Diabetes Mother    Cancer Brother    Colon cancer Neg Hx    Esophageal cancer Neg Hx    Rectal cancer Neg Hx    Stomach  cancer Neg Hx     BP 100/78 (BP Location: Right Arm, Patient Position: Sitting, Cuff Size: Normal)   Pulse (!) 53   Ht 5\' 7"  (1.702 m)   Wt 221 lb 9.6 oz (100.5 kg)   SpO2 96%   BMI 34.71 kg/m    Review of Systems     Objective:   Physical Exam same   Lab Results  Component Value Date   HGBA1C 7.8 (A) 06/28/2021   Lab Results  Component Value Date   CREATININE 1.88 (H) 06/27/2021   BUN 43 (H) 06/27/2021   NA 144 06/27/2021   K 5.2 06/27/2021   CL 111 (H) 06/27/2021   CO2 22 06/27/2021      Assessment & Plan:  Meter prob.  Ref CDE Insulin-requiring type 2 DM: uncontrolled.  However, this is the best control this pt should aim for, given this regimen, which does match insulin to his changing needs throughout the day.   Patient Instructions  Your heart rate is slow today.  Please follow this up with your heart doctor soon.   Please continue the same Lantus. check your blood sugar twice a day.  vary the time of day when you check, between before the 3 meals, and at bedtime.  also check if you have symptoms of your blood sugar being too high or too low.  please keep a record of the readings and bring it to your next appointment here (or you can bring the meter itself).  You can write it on any piece of paper.  please call us sooner if your blood sugar goes below 70, or if you have a lot of readings over 200.   Please have a follow-up appointment in January.

## 2021-06-28 NOTE — Patient Instructions (Addendum)
Your heart rate is slow today.  Please follow this up with your heart doctor soon.   Please continue the same Lantus. check your blood sugar twice a day.  vary the time of day when you check, between before the 3 meals, and at bedtime.  also check if you have symptoms of your blood sugar being too high or too low.  please keep a record of the readings and bring it to your next appointment here (or you can bring the meter itself).  You can write it on any piece of paper.  please call us sooner if your blood sugar goes below 70, or if you have a lot of readings over 200.   Please have a follow-up appointment in January.

## 2021-06-29 LAB — COMPLETE METABOLIC PANEL WITH GFR
AG Ratio: 1.1 (calc) (ref 1.0–2.5)
ALT: 12 U/L (ref 9–46)
AST: 19 U/L (ref 10–35)
Albumin: 3.4 g/dL — ABNORMAL LOW (ref 3.6–5.1)
Alkaline phosphatase (APISO): 84 U/L (ref 35–144)
BUN/Creatinine Ratio: 23 (calc) — ABNORMAL HIGH (ref 6–22)
BUN: 43 mg/dL — ABNORMAL HIGH (ref 7–25)
CO2: 22 mmol/L (ref 20–32)
Calcium: 9 mg/dL (ref 8.6–10.3)
Chloride: 111 mmol/L — ABNORMAL HIGH (ref 98–110)
Creat: 1.88 mg/dL — ABNORMAL HIGH (ref 0.70–1.28)
Globulin: 3.2 g/dL (calc) (ref 1.9–3.7)
Glucose, Bld: 84 mg/dL (ref 65–99)
Potassium: 5.2 mmol/L (ref 3.5–5.3)
Sodium: 144 mmol/L (ref 135–146)
Total Bilirubin: 0.6 mg/dL (ref 0.2–1.2)
Total Protein: 6.6 g/dL (ref 6.1–8.1)
eGFR: 36 mL/min/{1.73_m2} — ABNORMAL LOW (ref >=60)

## 2021-06-29 LAB — TEST AUTHORIZATION

## 2021-06-29 LAB — FERRITIN: Ferritin: 65 ng/mL (ref 24–380)

## 2021-06-29 LAB — IRON, TOTAL/TOTAL IRON BINDING CAP
%SAT: 20 % (calc) (ref 20–48)
Iron: 56 ug/dL (ref 50–180)
TIBC: 286 mcg/dL (calc) (ref 250–425)

## 2021-06-29 LAB — CBC
HCT: 37.4 % — ABNORMAL LOW (ref 38.5–50.0)
Hemoglobin: 12.2 g/dL — ABNORMAL LOW (ref 13.2–17.1)
MCH: 27.2 pg (ref 27.0–33.0)
MCHC: 32.6 g/dL (ref 32.0–36.0)
MCV: 83.3 fL (ref 80.0–100.0)
MPV: 10.5 fL (ref 7.5–12.5)
Platelets: 297 10*3/uL (ref 140–400)
RBC: 4.49 10*6/uL (ref 4.20–5.80)
RDW: 13 % (ref 11.0–15.0)
WBC: 11.3 10*3/uL — ABNORMAL HIGH (ref 3.8–10.8)

## 2021-06-29 LAB — LIPID PANEL
Cholesterol: 155 mg/dL (ref <200)
HDL: 29 mg/dL — ABNORMAL LOW (ref >=40)
LDL Cholesterol (Calc): 97 mg/dL (calc)
Non-HDL Cholesterol (Calc): 126 mg/dL (calc) (ref <130)
Total CHOL/HDL Ratio: 5.3 (calc) — ABNORMAL HIGH (ref <5.0)
Triglycerides: 197 mg/dL — ABNORMAL HIGH (ref <150)

## 2021-06-29 LAB — VITAMIN B12: Vitamin B-12: 231 pg/mL (ref 200–1100)

## 2021-06-29 LAB — PSA: PSA: 4.96 ng/mL — ABNORMAL HIGH (ref <=4.00)

## 2021-06-29 LAB — TRANSFERRIN: Transferrin: 219 mg/dL (ref 188–341)

## 2021-07-01 ENCOUNTER — Telehealth: Payer: Self-pay | Admitting: Nurse Practitioner

## 2021-07-01 NOTE — Telephone Encounter (Signed)
Pt son returning your phone call from yesterday

## 2021-07-01 NOTE — Telephone Encounter (Signed)
Called Jake Gross again and left a detailed message asking if he can let us know if he is given patient his Torsemide 1 twice daily in his pill box. Patient could not confirm for sure how he was taking this medication since his son fixes his pill box.

## 2021-07-01 NOTE — Telephone Encounter (Signed)
Called Son back, someone picked up but all I heard was background noises and no one was answering or speaking. Will try again later

## 2021-07-04 NOTE — Telephone Encounter (Signed)
See result notes. Spoke with Shawn, patient has been taking Torsemide 20 mg 1 tablet twice daily on Monday, Wednesday, Fridays and 1 tablet daily all the other days.  Transferred call to Romilda Garret who spoke with [facility] with further recommendations.

## 2021-07-05 ENCOUNTER — Telehealth: Payer: Self-pay | Admitting: *Deleted

## 2021-07-05 NOTE — Telephone Encounter (Signed)
Left message for patient to call back  

## 2021-07-05 NOTE — Telephone Encounter (Signed)
-----   Message from Burnell Blanks, MD sent at 07/04/2021  3:53 PM EDT ----- Regarding: RE: BNP/SHOB Thanks. We will touch base with him. Gerald Stabs  ----- Message ----- From: Michela Pitcher, NP Sent: 07/04/2021   2:38 PM EDT To: Burnell Blanks, MD, # Subject: BNP/SHOB                                       Dr. Angelena Form,  Saw a mutual patient on 06/27/2021 for a TOC. He complained of increase dyspnea. Got some labs and noticed that he had an elevated BNP. We got in touch with his son and he informed me that he is taking the torsemide twice daily on Monday, Wednesday, and Friday then daily every other day. I see you have it prescribed BID daily. I asked him to increase it daily for this week and would reach out to your office to see how he truly is suppose to take the Torsemide. Thanks for your time  [place]

## 2021-07-07 ENCOUNTER — Other Ambulatory Visit: Payer: Self-pay | Admitting: Family

## 2021-07-14 NOTE — Telephone Encounter (Signed)
Spoke with patient this morning.  He reports that he continues to be SOB and have swelling in RLE.  He had labs 2 weeks ago with instructions from PCP to increase torsemide and has not noted any change in symptoms with this increase.  The patient thinks he is taking torsemide one tablet twice a day.  He has no cough, no chest pain or tightness.  No new labs or plan for labs since 06/27/21.  He is overdue for follow up in cardiology.  I have scheduled him on 07/18/21 with Dr. Angelena Form.  I asked him to bring his medications or a list with how he is taking them with him.

## 2021-07-15 ENCOUNTER — Other Ambulatory Visit: Payer: Self-pay | Admitting: Endocrinology

## 2021-07-17 NOTE — Progress Notes (Signed)
Chief Complaint  Patient presents with   Follow-up    History of Present Illness: 80 yo male with history of CAD s/p CABG in February 2020, HTN, HLD, CKD, prior CVA and paroxysmal atrial fibrillation who is here today for cardiac follow up. His atrial fibrillation is followed by Dr. Rayann Heman. He is on Eliqus. He was found to have severe left main and three vessel CAD in February 2020 and underwent 4V CABG in February 2020. Echo February 2020 with LVEF=60-65%. No significant valve disease. 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  GI said it was okay to restart Eliquis.  CHA2DS2-VASc equals 8 2D echo showed normal LV function with mild to moderate MR. He has been on Demadex. 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 Last creatinine 1.88 on 06/27/21. (Baseline 1.9-2.0).   He is here today for follow up. The patient denies any chest pain, palpitations, orthopnea, PND, dizziness, near syncope or syncope. He reports worsened dyspnea and some LE edema. His right leg is very swollen over the past 4 weeks. No left leg edema. He has dyspnea with minimal exertion. Has not taken Eliquis in several years and not sure why. He has been taking Torsemide 20 mg po BID.   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   CAD (coronary artery disease) 09/29/2019   Cervical spondylosis    Chronic anticoagulation    Chronic diastolic CHF (congestive heart failure) ([place]) 12/17/2018   CKD  (chronic kidney disease) 12/17/2018   Diabetes mellitus    History of GI bleed 09/29/2019   Hyperlipidemia    Hypertension    Iron deficiency anemia due to chronic blood loss    Paroxysmal atrial fibrillation ([place]) 12/17/2018   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   Stroke (cerebrum) ([place]) 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 NO ISCHEMIA   COLONOSCOPY W/ POLYPECTOMY     5 polyps   COLONOSCOPY WITH PROPOFOL N/A 08/29/2019   Procedure: COLONOSCOPY WITH PROPOFOL;  Surgeon: Doran Stabler, MD;  Location: [place];  Service: Gastroenterology;  Laterality: N/A;   CORONARY ARTERY BYPASS GRAFT N/A 11/26/2018   Procedure: CORONARY ARTERY BYPASS GRAFTING (CABG), ON PUMP, TIMES FOUR, USING LEFT INTERNAL MAMMARY ARTERY AND ENDOSCOPICALLY HARVESTED LEFT SAPHENOUS VEIN;  Surgeon: Gaye Pollack, MD;  Location: [place];  Service: Open Heart Surgery;  Laterality: N/A;   ESOPHAGOGASTRODUODENOSCOPY (EGD) WITH PROPOFOL N/A 08/29/2019   Procedure: ESOPHAGOGASTRODUODENOSCOPY (EGD) WITH PROPOFOL;  Surgeon: Doran Stabler, MD;  Location: [place];  Service: Gastroenterology;  Laterality: N/A;   EUS N/A 03/11/2015   Procedure: LOWER ENDOSCOPIC ULTRASOUND (EUS);  Surgeon: Milus Banister, MD;  Location: [facility];  Service: Endoscopy;  Laterality: N/A;   FLEXIBLE SIGMOIDOSCOPY N/A 02/02/2015   Procedure: FLEXIBLE SIGMOIDOSCOPY;  Surgeon: Inda Castle, MD;  Location: [facility];  Service: Endoscopy;  Laterality: N/A;  ERBE   HEMOSTASIS CLIP PLACEMENT  08/29/2019   Procedure: HEMOSTASIS CLIP PLACEMENT;  Surgeon: Doran Stabler, MD;  Location: [facility];  Service: Gastroenterology;;   HOT HEMOSTASIS N/A 08/29/2019   Procedure: HOT HEMOSTASIS (ARGON PLASMA COAGULATION/BICAP);  Surgeon: Doran Stabler, MD;  Location: [facility];  Service: Gastroenterology;   Laterality: N/A;   LEFT HEART CATH AND CORONARY ANGIOGRAPHY N/A 11/20/2018   Procedure: LEFT HEART CATH AND CORONARY ANGIOGRAPHY;  Surgeon: Burnell Blanks, MD;  Location: [place] [facility];  Service: Cardiovascular;  Laterality: N/A;   LOOP RECORDER INSERTION N/A 08/14/2017   Procedure: LOOP RECORDER INSERTION;  Surgeon: [place] Grayer, MD;  Location: [facility];  Service: Cardiovascular;  Laterality: N/A;   PARTIAL PROCTECTOMY BY TEM N/A 04/08/2015   Procedure: TEM PARTIAL PROCTECTOMY OF RECTAL MASS;  Surgeon: Michael Boston, MD;  Location: [facility];  Service: General;  Laterality: N/A;   POLYPECTOMY  08/29/2019   Procedure: POLYPECTOMY;  Surgeon: Doran Stabler, MD;  Location: [facility];  Service: Gastroenterology;;   TEE WITHOUT CARDIOVERSION N/A 06/25/2017   Procedure: TRANSESOPHAGEAL ECHOCARDIOGRAM (TEE);  Surgeon: Skeet Latch, MD;  Location: [place];  Service: Cardiovascular;  Laterality: N/A;   TEE WITHOUT CARDIOVERSION N/A 11/26/2018   Procedure: TRANSESOPHAGEAL ECHOCARDIOGRAM (TEE);  Surgeon: Gaye Pollack, MD;  Location: [facility];  Service: Open Heart Surgery;  Laterality: N/A;   TONSILLECTOMY AND ADENOIDECTOMY     as child    Current Outpatient Medications  Medication Sig Dispense Refill   amLODipine (NORVASC) 5 MG tablet Take 1 tablet (5 mg total) by mouth daily. Pt needs to keep upcoming appt in Feb, 2023 for further refills 90 tablet 1   apixaban (ELIQUIS) 5 MG TABS tablet Take 1 tablet (5 mg total) by mouth 2 (two) times daily. 60 tablet 5   glucose blood (ONETOUCH VERIO) test strip 1 each by Other route in the morning and at bedtime. And lancets 2/day 100 each 3   hydrALAZINE (APRESOLINE) 100 MG tablet Take 1 tablet (100 mg total) by mouth 3 (three) times daily. Patient must keep 11/21/2021 appointment for further refills 270 tablet 1   isosorbide mononitrate (IMDUR) 60 MG 24 hr tablet Take 1 tablet (60 mg total) by mouth daily. Pt needs to keep upcoming  appt in Feb, 2023 for further refills 90 tablet 1   Lancets (ONETOUCH ULTRASOFT) lancets Use as instructed 100 each 12   metoprolol tartrate (LOPRESSOR) 25 MG tablet TAKE 1 AND 1/2 TABLETS BY MOUTH TWICE A DAY NEEDS APPT FOR REFILLS 270 tablet 2   rosuvastatin (CRESTOR) 20 MG tablet TAKE 1 TABLET BY MOUTH AT BEDTIME. SCHEDULE PHYSICAL EXAM 30 tablet 0   tamsulosin (FLOMAX) 0.4 MG CAPS capsule TAKE 1 CAPSULE BY MOUTH EVERY DAY 90 capsule 1   torsemide (DEMADEX) 20 MG tablet Take 1 tablet (20 mg total) by mouth 2 (two) times daily. Pt needs to keep upcoming appt in Feb, 2023 for further refills 180 tablet 1   fenofibrate micronized (LOFIBRA) 67 MG capsule TAKE 1 CAPSULE BY MOUTH DAILY BEFORE BREAKFAST. SCHEDULE PHYSICAL (Patient not taking: Reported on 07/18/2021) 90 capsule 0   insulin glargine-yfgn (SEMGLEE, YFGN,) 100 UNIT/ML Pen Inject 60 Units into the skin every morning. 60 mL 3   pantoprazole (PROTONIX) 40 MG tablet Take 1 tablet (40 mg total) by mouth daily before breakfast. 30 tablet 0  No current facility-administered medications for this visit.    No Known Allergies  Social History   Socioeconomic History   Marital status: Divorced    Spouse name: Not on file   Number of children: 2   Years of education: Not on file   Highest education level: Not on file  Occupational History   Occupation: retired    Fish farm manager: [employer]  Tobacco Use   Smoking status: Former    Types: Cigarettes    Quit date: 08/13/1994    Years since quitting: 26.9   Smokeless tobacco: Never  Vaping Use   Vaping Use: Never used  Substance and Sexual Activity   Alcohol use: No    Alcohol/week: 0.0 standard drinks   Drug use: No   Sexual activity: Not Currently    Birth control/protection: None  Other Topics Concern   Not on file  Social History Narrative   Not on file   Social Determinants of Health   Financial Resource Strain: Not on file  Food Insecurity: No Food Insecurity   Worried About Paediatric nurse in the Last Year: Never true   [place] in the Last Year: Never true  Transportation Needs: No Transportation Needs   Lack of Transportation (Medical): No   Lack of Transportation (Non-Medical): No  Physical Activity: Not on file  Stress: Not on file  Social Connections: Not on file  Intimate Partner Violence: Not on file    Family History  Problem Relation Age of Onset   Cancer Father        "all over"   Coronary artery disease Brother    Diabetes Brother    Stroke Mother    Diabetes Mother    Cancer Brother    Colon cancer Neg Hx    Esophageal cancer Neg Hx    Rectal cancer Neg Hx    Stomach cancer Neg Hx     Review of Systems:  As stated in the HPI and otherwise negative.   BP (!) 152/58 (BP Location: Right Arm, Patient Position: Sitting, Cuff Size: Normal)   Pulse (!) 56   Wt 220 lb 3.2 oz (99.9 kg)   SpO2 96%   BMI 34.49 kg/m   Physical Examination:  General: Well developed, well nourished, NAD  HEENT: OP clear, mucus membranes moist  SKIN: warm, dry. No rashes. Neuro: No focal deficits  Musculoskeletal: Muscle strength 5/5 all ext  Psychiatric: Mood and affect normal  Neck: No JVD, no carotid bruits, no thyromegaly, no lymphadenopathy.  Lungs:Clear bilaterally, no wheezes, rhonci, crackles Cardiovascular: Regular rate and rhythm. No murmurs, gallops or rubs. Abdomen:Soft. Bowel sounds present. Non-tender.  Extremities: 2+ right LE edema. No left LE edema.   EKG:  EKG is not ordered today. The ekg ordered today demonstrates   Recent Labs: 06/27/2021: ALT 12; ALT 12; Brain Natriuretic Peptide 422; BUN 43; BUN 43; Creat 1.88; Creat 1.88; Hemoglobin 12.2; Platelets 297; Potassium 5.2; Potassium 5.2; Sodium 144; Sodium 144   Lipid Panel    Component Value Date/Time   CHOL 155 06/27/2021 1252   TRIG 197 (H) 06/27/2021 1252   HDL 29 (L) 06/27/2021 1252   CHOLHDL 5.3 (H) 06/27/2021 1252   VLDL 42.8 (H) 11/12/2018 1528   LDLCALC 97  06/27/2021 1252   LDLDIRECT 80.0 11/12/2018 1528     Wt Readings from Last 3 Encounters:  07/18/21 220 lb 3.2 oz (99.9 kg)  06/28/21 221 lb 9.6 oz (100.5 kg)  06/27/21 219 lb  4 oz (99.5 kg)     Assessment and Plan:   1. CAD s/p CABG without angina: He has no chest pain. Will continue ASA, statin, Imdur and beta blocker.  Echo now to assess LVEF  2. HTN: BP is slightly elevated. No changes today  3. Paroxysmal atrial fibrillation: Followed in our EP clinic by Dr. Rayann Heman. Continue beta blocker. He has been off of his Eliquis and he is not sure why. Will restart Eliquis 5 mg po BID  4. Acute on Chronic diastolic CHF: Weight is stable but he has right LE edema and worsened dyspnea. Will get a BMET and BNP today. Increase torsemide to 40 mg in the am and 20 mg in the PM for 3 days.   5. Left leg edema: Venous doppler to exclude DVT    Current medicines are reviewed at length with the patient today.  The patient does not have concerns regarding medicines.  The following changes have been made:  no change  Labs/ tests ordered today include:   Orders Placed This Encounter  Procedures   Basic metabolic panel   Pro b natriuretic peptide (BNP)   Ambulatory referral to Nephrology   ECHOCARDIOGRAM COMPLETE   VAS [place] LOWER EXTREMITY VENOUS (DVT)      Disposition:   F/U closely with me or APP in 2-3 weeks.      Signed, Lauree Chandler, MD 07/18/2021 12:31 PM    [facility] [address], [place], [place]  [address] Phone: [phone]; Fax: [phone]

## 2021-07-18 ENCOUNTER — Ambulatory Visit (INDEPENDENT_AMBULATORY_CARE_PROVIDER_SITE_OTHER): Payer: Medicare Other | Admitting: Cardiovascular Disease

## 2021-07-18 ENCOUNTER — Encounter: Payer: Self-pay | Admitting: Cardiovascular Disease

## 2021-07-18 ENCOUNTER — Other Ambulatory Visit: Payer: Self-pay

## 2021-07-18 ENCOUNTER — Telehealth: Payer: Self-pay | Admitting: Endocrinology

## 2021-07-18 ENCOUNTER — Other Ambulatory Visit: Payer: Self-pay | Admitting: Endocrinology

## 2021-07-18 VITALS — BP 152/58 | HR 56 | Wt 220.2 lb

## 2021-07-18 DIAGNOSIS — I480 Paroxysmal atrial fibrillation: Secondary | ICD-10-CM

## 2021-07-18 DIAGNOSIS — N184 Chronic kidney disease, stage 4 (severe): Secondary | ICD-10-CM

## 2021-07-18 DIAGNOSIS — I5032 Chronic diastolic (congestive) heart failure: Secondary | ICD-10-CM | POA: Diagnosis not present

## 2021-07-18 DIAGNOSIS — I25810 Atherosclerosis of coronary artery bypass graft(s) without angina pectoris: Secondary | ICD-10-CM | POA: Diagnosis not present

## 2021-07-18 MED ORDER — APIXABAN 5 MG PO TABS: 5.0000 mg | ORAL_TABLET | Freq: Two times a day (BID) | ORAL | 5 refills | Status: DC

## 2021-07-18 MED ORDER — INSULIN GLARGINE-YFGN 100 UNIT/ML ~~LOC~~ SOPN: 60.0000 [IU] | PEN_INJECTOR | SUBCUTANEOUS | 3 refills | Status: DC

## 2021-07-18 NOTE — Patient Instructions (Signed)
Medication Instructions:  Your physician has recommended you make the following change in your medication:  1.) restart apixaban (Eliquis) 5 mg -take one tablet twice daily 2.) for the next three days --take Torsemide 40 mg every morning.  Continue to take 20 mg every PM.  After 3 days, go back to 20 mg twice a day   *If you need a refill on your cardiac medications before your next appointment, please call your pharmacy*   Lab Work: Today: BMET, BNP  If you have labs (blood work) drawn today and your tests are completely normal, you will receive your results only by: [place] (if you have MyChart) OR A paper copy in the mail If you have any lab test that is abnormal or we need to change your treatment, we will call you to review the results.   Testing/Procedures: Your physician has requested that you have an echocardiogram. Echocardiography is a painless test that uses sound waves to create images of your heart. It provides your doctor with information about the size and shape of your heart and how well your heart's chambers and valves are working. This procedure takes approximately one hour. There are no restrictions for this procedure.  Your physician has requested that you have a lower extremity venous duplex. This test is an ultrasound of the veins in the legs. It looks at venous blood flow that carries blood from the heart to the legs. Allow one hour for a Lower Venous exam. There are no restrictions or special instructions.   Follow-Up: At [facility], you and your health needs are our priority.  As part of our continuing mission to provide you with exceptional heart care, we have created designated Provider Care Teams.  These Care Teams include your primary Cardiologist (physician) and Advanced Practice Providers (APPs -  Physician Assistants and Nurse Practitioners) who all work together to provide you with the care you need, when you need it.  We recommend signing up  for the patient portal called "MyChart".  Sign up information is provided on this After Visit Summary.  MyChart is used to connect with patients for Virtual Visits (Telemedicine).  Patients are able to view lab/test results, encounter notes, upcoming appointments, etc.  Non-urgent messages can be sent to your provider as well.   To learn more about what you can do with MyChart, go to [email or web address].    Your next appointment:   2-4  week(s)  The format for your next appointment:   In Person  Provider:   You may see Lauree Chandler, MD or one of the following Advanced Practice Providers on your designated Care Team:   Melina Copa, PA-C Ermalinda Barrios, PA-C   Other Instructions

## 2021-07-18 NOTE — Telephone Encounter (Signed)
Pts ins no longer cover Lantus, per [facility] [address] suggests pt be prescribed Semglee in which INS covers. Please provide any information about medication to patient. Pt [phone] Pt out of Lantus, has no substitutes and needs directions.

## 2021-07-19 ENCOUNTER — Telehealth: Payer: Self-pay | Admitting: *Deleted

## 2021-07-19 DIAGNOSIS — E875 Hyperkalemia: Secondary | ICD-10-CM

## 2021-07-19 LAB — BASIC METABOLIC PANEL
BUN/Creatinine Ratio: 24 (ref 10–24)
BUN: 45 mg/dL — ABNORMAL HIGH (ref 8–27)
CO2: 19 mmol/L — ABNORMAL LOW (ref 20–29)
Calcium: 9 mg/dL (ref 8.6–10.2)
Chloride: 110 mmol/L — ABNORMAL HIGH (ref 96–106)
Creatinine, Ser: 1.9 mg/dL — ABNORMAL HIGH (ref 0.76–1.27)
Glucose: 153 mg/dL — ABNORMAL HIGH (ref 70–99)
Potassium: 5.7 mmol/L — ABNORMAL HIGH (ref 3.5–5.2)
Sodium: 143 mmol/L (ref 134–144)
eGFR: 36 mL/min/{1.73_m2} — ABNORMAL LOW (ref >59)

## 2021-07-19 LAB — PRO B NATRIURETIC PEPTIDE: NT-Pro BNP: 1363 pg/mL — ABNORMAL HIGH (ref 0–486)

## 2021-07-19 NOTE — Telephone Encounter (Signed)
His renal function is stable but his potassium is high at 5.7. He is not on any medications to make him hyperkalemic. BNP elevated as expected.   -He will need a repeat BMET to confirm his potassium level today or tomorrow.     Thanks, chris

## 2021-07-19 NOTE — Telephone Encounter (Signed)
Left message for patient to call office to discuss/arrange lab visit.  Pt has appt for LE duplex tomorrow and can get labs then.  Order placed for BMET.

## 2021-07-20 ENCOUNTER — Other Ambulatory Visit: Payer: Self-pay

## 2021-07-20 ENCOUNTER — Ambulatory Visit (HOSPITAL_COMMUNITY)
Admission: RE | Admit: 2021-07-20 | Discharge: 2021-07-20 | Disposition: A | Discharge status: Home / Self Care | Payer: Medicare Other | Source: Ambulatory Visit | Attending: Cardiovascular Disease | Admitting: Cardiovascular Disease

## 2021-07-20 ENCOUNTER — Other Ambulatory Visit: Payer: Medicare Other | Admitting: *Deleted

## 2021-07-20 ENCOUNTER — Telehealth: Payer: Self-pay

## 2021-07-20 DIAGNOSIS — I480 Paroxysmal atrial fibrillation: Secondary | ICD-10-CM | POA: Insufficient documentation

## 2021-07-20 DIAGNOSIS — R600 Localized edema: Secondary | ICD-10-CM | POA: Diagnosis not present

## 2021-07-20 DIAGNOSIS — I25810 Atherosclerosis of coronary artery bypass graft(s) without angina pectoris: Secondary | ICD-10-CM | POA: Diagnosis not present

## 2021-07-20 DIAGNOSIS — I5032 Chronic diastolic (congestive) heart failure: Secondary | ICD-10-CM | POA: Diagnosis not present

## 2021-07-20 DIAGNOSIS — N184 Chronic kidney disease, stage 4 (severe): Secondary | ICD-10-CM | POA: Insufficient documentation

## 2021-07-20 DIAGNOSIS — E875 Hyperkalemia: Secondary | ICD-10-CM | POA: Diagnosis not present

## 2021-07-20 LAB — BASIC METABOLIC PANEL
BUN/Creatinine Ratio: 25 — ABNORMAL HIGH (ref 10–24)
BUN: 58 mg/dL — ABNORMAL HIGH (ref 8–27)
CO2: 23 mmol/L (ref 20–29)
Calcium: 9 mg/dL (ref 8.6–10.2)
Chloride: 110 mmol/L — ABNORMAL HIGH (ref 96–106)
Creatinine, Ser: 2.32 mg/dL — ABNORMAL HIGH (ref 0.76–1.27)
Glucose: 222 mg/dL — ABNORMAL HIGH (ref 70–99)
Potassium: 5.4 mmol/L — ABNORMAL HIGH (ref 3.5–5.2)
Sodium: 141 mmol/L (ref 134–144)
eGFR: 28 mL/min/{1.73_m2} — ABNORMAL LOW (ref >59)

## 2021-07-20 NOTE — Telephone Encounter (Signed)
**Note De-Identified  Obfuscation** Eliquis PA started through covermymeds. Key: [id number]

## 2021-07-20 NOTE — Telephone Encounter (Signed)
Reached pt on phone.  He just left the duplex study.  Will stop at lab at [address] office to repeat BMET.

## 2021-07-21 ENCOUNTER — Other Ambulatory Visit: Payer: Self-pay | Admitting: *Deleted

## 2021-07-21 DIAGNOSIS — I480 Paroxysmal atrial fibrillation: Secondary | ICD-10-CM

## 2021-07-21 DIAGNOSIS — N3944 Nocturnal enuresis: Secondary | ICD-10-CM | POA: Diagnosis not present

## 2021-07-21 DIAGNOSIS — N184 Chronic kidney disease, stage 4 (severe): Secondary | ICD-10-CM

## 2021-07-21 DIAGNOSIS — I5032 Chronic diastolic (congestive) heart failure: Secondary | ICD-10-CM

## 2021-07-21 DIAGNOSIS — R338 Other retention of urine: Secondary | ICD-10-CM | POA: Diagnosis not present

## 2021-08-02 ENCOUNTER — Other Ambulatory Visit: Payer: Self-pay

## 2021-08-02 ENCOUNTER — Ambulatory Visit (HOSPITAL_COMMUNITY): Payer: Medicare Other | Attending: Cardiovascular Disease

## 2021-08-02 DIAGNOSIS — N184 Chronic kidney disease, stage 4 (severe): Secondary | ICD-10-CM | POA: Diagnosis not present

## 2021-08-02 DIAGNOSIS — I480 Paroxysmal atrial fibrillation: Secondary | ICD-10-CM | POA: Insufficient documentation

## 2021-08-02 DIAGNOSIS — I25810 Atherosclerosis of coronary artery bypass graft(s) without angina pectoris: Secondary | ICD-10-CM | POA: Diagnosis not present

## 2021-08-02 DIAGNOSIS — I5032 Chronic diastolic (congestive) heart failure: Secondary | ICD-10-CM | POA: Diagnosis not present

## 2021-08-02 LAB — ECHOCARDIOGRAM COMPLETE
Area-P 1/2: 3.77 cm2
S' Lateral: 3.75 cm

## 2021-08-03 ENCOUNTER — Other Ambulatory Visit: Payer: Self-pay | Admitting: *Deleted

## 2021-08-03 ENCOUNTER — Other Ambulatory Visit: Payer: Self-pay | Admitting: Family

## 2021-08-03 NOTE — Patient Instructions (Signed)
Goals Addressed             This Visit's Progress    (THN)Lifestyle Change-Hypertension   On track    Timeframe:  Long-Range Goal Priority:  Medium Start Date: [id number]                            Expected End Date:   [id number]   Follow up [id number]                  - agree to work together to make changes - learn about high blood pressure    Why is this important?   The changes that you are asked to make may be hard to do.  This is especially true when the changes are life-long.  Knowing why it is important to you is the first step.  Working on the change with your family or support person helps you not feel alone.  Reward yourself and family or support person when goals are met. This can be an activity you choose like bowling, hiking, biking, swimming or shooting hoops.     Notes:  [id number] Patient has not started an exercise routine [id number] patient is going to dietician to help with meal planning.      [facility] and Keep All Appointments   On track    Timeframe:  Long-Range Goal Priority:  High Start Date:     08144818                        Expected End Date:       [id number]          Follow up [id number]   - call to cancel if needed - keep a calendar with prescription refill dates - keep a calendar with appointment dates    Why is this important?   Part of staying healthy is seeing the doctor for follow-up care.  If you forget your appointments, there are some things you can do to stay on track.    Notes:  [id number] Patient is keeping appointments [id number] Patient has scheduled PCP, cardiology and dietary appointment.  Patient will be scheduling eye exam     (THN)Track and Manage My Blood Pressure-Hypertension   On track    Timeframe:  Short-Term Goal Priority:  High Start Date:      [id number]                       Expected End Date: [id number]                   Follow up visit [id number]  - check blood pressure weekly - choose a place to take my blood pressure (home,  clinic or office, retail store) - write blood pressure results in a log or diary    Why is this important?   You won't feel high blood pressure, but it can still hurt your blood vessels.  High blood pressure can cause heart or kidney problems. It can also cause a stroke.  Making lifestyle changes like losing a little weight or eating less salt will help.  Checking your blood pressure at home and at different times of the day can help to control blood pressure.  If the doctor prescribes medicine remember to take it the way the doctor ordered.  Call the office if you cannot afford the medicine or if there are questions about it.  Notes:  [id number] Patient has been having BP taken at the Dr office only. Not following up at home. [id number] Per patient BP is being monitored at the Dr office only

## 2021-08-03 NOTE — Patient Outreach (Signed)
[facility]) Care Management  [place]  08/03/2021   [place] 23-Dec-1940 [id number]  RN Health Coach telephone call to patient.  [employer] compliance verified. Per patient he has been having some episodes of blood pressure elevated 159/74.  Patient stated he has not been monitoring his blood pressure at home. Patient has received his flu vaccine. Patient stated he knows his CKD is worse and he will be following up with the kidney specialist. Patient stated the swelling has decreased in his leg.  He is taking his medications as per ordered. Per patient he has not been going out much only to the grocery store. He stated he is not having any pain. Patient has agreed to follow up outreach calls.   Encounter Medications:  Outpatient Encounter Medications as of 08/03/2021  Medication Sig   amLODipine (NORVASC) 5 MG tablet Take 1 tablet (5 mg total) by mouth daily. Pt needs to keep upcoming appt in Feb, 2023 for further refills   apixaban (ELIQUIS) 5 MG TABS tablet Take 1 tablet (5 mg total) by mouth 2 (two) times daily.   fenofibrate micronized (LOFIBRA) 67 MG capsule TAKE 1 CAPSULE BY MOUTH DAILY BEFORE BREAKFAST. SCHEDULE PHYSICAL (Patient not taking: Reported on 07/18/2021)   glucose blood (ONETOUCH VERIO) test strip 1 each by Other route in the morning and at bedtime. And lancets 2/day   hydrALAZINE (APRESOLINE) 100 MG tablet Take 1 tablet (100 mg total) by mouth 3 (three) times daily. Patient must keep 11/21/2021 appointment for further refills   insulin glargine-yfgn (SEMGLEE, YFGN,) 100 UNIT/ML Pen Inject 60 Units into the skin every morning.   isosorbide mononitrate (IMDUR) 60 MG 24 hr tablet Take 1 tablet (60 mg total) by mouth daily. Pt needs to keep upcoming appt in Feb, 2023 for further refills   Lancets (ONETOUCH ULTRASOFT) lancets Use as instructed   metoprolol tartrate (LOPRESSOR) 25 MG tablet TAKE 1 AND 1/2 TABLETS BY MOUTH TWICE A DAY NEEDS APPT FOR  REFILLS   pantoprazole (PROTONIX) 40 MG tablet Take 1 tablet (40 mg total) by mouth daily before breakfast.   rosuvastatin (CRESTOR) 20 MG tablet TAKE 1 TABLET BY MOUTH AT BEDTIME. SCHEDULE PHYSICAL EXAM   tamsulosin (FLOMAX) 0.4 MG CAPS capsule TAKE 1 CAPSULE BY MOUTH EVERY DAY   torsemide (DEMADEX) 20 MG tablet Take 1 tablet (20 mg total) by mouth 2 (two) times daily. Pt needs to keep upcoming appt in Feb, 2023 for further refills   No facility-administered encounter medications on file as of 08/03/2021.    Functional Status:  No flowsheet data found.  Fall/Depression Screening: Fall Risk  06/27/2021 11/19/2018 08/07/2018  Falls in the past year? 1 1 Yes  Number falls in past yr: 0 0 1  Injury with Fall? 0 0 Yes  Risk for fall due to : - History of fall(s);Impaired balance/gait;Impaired mobility -  Follow up - Falls evaluation completed -   PHQ 2/9 Scores 09/22/2020 11/19/2018 07/16/2018 12/19/2017 12/05/2017 11/16/2017 09/13/2017  PHQ - 2 Score 0 0 0 0 0 0 0    Assessment:   Care Plan Care Plan : Hypertension (Adult)  Updates made by Verlin Grills, RN since 08/03/2021 12:00 AM     Problem: Hypertension (Hypertension)   Priority: High  Onset Date: 09/22/2020     Goal: Hypertension Monitored   Start Date: 04/26/2020  Expected End Date: 11/14/2021  This Visit's Progress: On track  Recent Progress: Not on track  Priority: High  Note:  Evidence-based guidance:  Promote initial use of ambulatory blood pressure measurements (for 3 days) to rule out "white-coat" effect; identify masked hypertension and presence or absence of nocturnal "dipping" of blood pressure.   Encourage continued use of home blood pressure monitoring and recording in blood pressure log; include symptoms of hypotension or potential medication side effects in log.  Review blood pressure measurements taken inside and outside of the provider office; establish baseline and monitor trends; compare to target ranges or  patient goal.  Share overall cardiovascular risk with patient; encourage changes to lifestyle risk factors, including alcohol consumption, smoking, inadequate exercise, poor dietary habits and stress.   Notes:  [id number] Patient is not monitoring BP at home [id number] Patient is not monitoring blood pressure at home    Task: Identify and Monitor Blood Pressure Elevation   Due Date: 11/14/2021  Note:   Care Management Activities:    - blood pressure equipment and technique reviewed - home or ambulatory blood pressure monitoring encouraged    Notes:  [id number] Per patient he has been going to the Dr frequently and his blood pressure was being monitored      Goals Addressed             This Visit's Progress    (THN)Lifestyle Change-Hypertension   On track    Timeframe:  Long-Range Goal Priority:  Medium Start Date: [id number]                            Expected End Date:   [id number]   Follow up [id number]                  - agree to work together to make changes - learn about high blood pressure    Why is this important?   The changes that you are asked to make may be hard to do.  This is especially true when the changes are life-long.  Knowing why it is important to you is the first step.  Working on the change with your family or support person helps you not feel alone.  Reward yourself and family or support person when goals are met. This can be an activity you choose like bowling, hiking, biking, swimming or shooting hoops.     Notes:  [id number] Patient has not started an exercise routine [id number] patient is going to dietician to help with meal planning.      [facility] and Keep All Appointments   On track    Timeframe:  Long-Range Goal Priority:  High Start Date:     [id number]                        Expected End Date:       [id number]          Follow up [id number]   - call to cancel if needed - keep a calendar with prescription refill dates - keep a calendar with appointment  dates    Why is this important?   Part of staying healthy is seeing the doctor for follow-up care.  If you forget your appointments, there are some things you can do to stay on track.    Notes:  [id number] Patient is keeping appointments [id number] Patient has scheduled PCP, cardiology and dietary appointment.  Patient will be scheduling eye exam     (THN)Track and Manage My Blood Pressure-Hypertension   On track    Timeframe:  Short-Term Goal Priority:  High Start Date:      [id number]                       Expected End Date: [id number]                   Follow up visit [id number]  - check blood pressure weekly - choose a place to take my blood pressure (home, clinic or office, retail store) - write blood pressure results in a log or diary    Why is this important?   You won't feel high blood pressure, but it can still hurt your blood vessels.  High blood pressure can cause heart or kidney problems. It can also cause a stroke.  Making lifestyle changes like losing a little weight or eating less salt will help.  Checking your blood pressure at home and at different times of the day can help to control blood pressure.  If the doctor prescribes medicine remember to take it the way the doctor ordered.  Call the office if you cannot afford the medicine or if there are questions about it.     Notes:  [id number] Patient has been having BP taken at the Dr office only. Not following up at home. [id number] Per patient BP is being monitored at the Dr office only        Plan:  Follow-up: Follow-up in 3 month(s) Patient has scheduled appointment with dietician Patient will follow up with COVID vaccine Patient will follow up eye exam Patient has scheduled appointment with Cardiologist RN will send PCP update assessment  [place] [phone]

## 2021-08-04 ENCOUNTER — Other Ambulatory Visit: Payer: Self-pay | Admitting: Family

## 2021-08-08 ENCOUNTER — Other Ambulatory Visit: Payer: Self-pay

## 2021-08-08 ENCOUNTER — Encounter: Payer: Self-pay | Admitting: Cardiovascular Disease

## 2021-08-08 ENCOUNTER — Ambulatory Visit (INDEPENDENT_AMBULATORY_CARE_PROVIDER_SITE_OTHER): Payer: Medicare Other | Admitting: Cardiovascular Disease

## 2021-08-08 VITALS — BP 122/62 | HR 59 | Ht 67.0 in | Wt 211.2 lb

## 2021-08-08 DIAGNOSIS — I480 Paroxysmal atrial fibrillation: Secondary | ICD-10-CM | POA: Diagnosis not present

## 2021-08-08 DIAGNOSIS — I2510 Atherosclerotic heart disease of native coronary artery without angina pectoris: Secondary | ICD-10-CM | POA: Diagnosis not present

## 2021-08-08 DIAGNOSIS — I25810 Atherosclerosis of coronary artery bypass graft(s) without angina pectoris: Secondary | ICD-10-CM

## 2021-08-08 DIAGNOSIS — I5032 Chronic diastolic (congestive) heart failure: Secondary | ICD-10-CM

## 2021-08-08 DIAGNOSIS — I10 Essential (primary) hypertension: Secondary | ICD-10-CM | POA: Diagnosis not present

## 2021-08-08 DIAGNOSIS — N184 Chronic kidney disease, stage 4 (severe): Secondary | ICD-10-CM | POA: Diagnosis not present

## 2021-08-08 NOTE — Progress Notes (Signed)
Chief Complaint  Patient presents with   Follow-up    dyspnea   History of Present Illness: 80 yo male with history of CAD s/p CABG in February 2020, HTN, HLD, CKD, prior CVA and paroxysmal atrial fibrillation who is here today for cardiac follow up. His atrial fibrillation is followed by Dr. Rayann Heman. He is on Eliqus. He was found to have severe left main and three vessel CAD in February 2020 and underwent 4V CABG in February 2020. Echo February 2020 with LVEF=60-65%. No significant valve disease. 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  GI said it was okay to restart Eliquis.  CHA2DS2-VASc equals 8 2D echo showed normal LV function with mild to moderate MR. He has been on Demadex. 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 Last creatinine 1.88 on 06/27/21. (Baseline 1.9-2.0). I saw him in the office 07/18/21 and he reported worsened dyspnea on exertion and LE edema. His right leg was more swollen than his left leg. Venous dopplers showed no evidence of DVT. He had been off of his Eliquis but was not sure why. He had been taking Torsemide 20 mg daily. Torsemide was increased to 20 mg po BID after 3 days of 40am/20pm. Echo 08/02/21 with LVEF=60-65% with grade 2 diastolic dysfunction. Normal RV function. No significant valve disease.   He is here today for follow up. The patient denies any chest pain, palpitations, lower extremity edema, orthopnea, PND, dizziness, near syncope or syncope. His right leg is no longer swollen. He is still having some dyspnea. No cough, fever or chills. He is seeing Nephrology later this week.   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   CAD (coronary artery disease) 09/29/2019   Cervical spondylosis    Chronic anticoagulation    Chronic diastolic CHF (congestive heart failure) ([place]) 12/17/2018   CKD (chronic kidney disease) 12/17/2018   Diabetes mellitus    History of GI bleed 09/29/2019   Hyperlipidemia    Hypertension    Iron deficiency anemia due to chronic blood loss    Paroxysmal atrial fibrillation ([place]) 12/17/2018   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   Stroke (cerebrum) ([place]) 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 NO ISCHEMIA   COLONOSCOPY W/ POLYPECTOMY     5 polyps   COLONOSCOPY WITH PROPOFOL N/A 08/29/2019   Procedure: COLONOSCOPY WITH PROPOFOL;  Surgeon: Doran Stabler, MD;  Location: [place];  Service: Gastroenterology;  Laterality: N/A;   CORONARY ARTERY BYPASS GRAFT N/A 11/26/2018   Procedure: CORONARY ARTERY BYPASS GRAFTING (CABG), ON PUMP, TIMES FOUR, USING LEFT INTERNAL MAMMARY ARTERY AND ENDOSCOPICALLY HARVESTED LEFT SAPHENOUS VEIN;  Surgeon: Gaye Pollack, MD;  Location: [place];  Service: Open Heart Surgery;  Laterality: N/A;   ESOPHAGOGASTRODUODENOSCOPY (EGD) WITH PROPOFOL N/A  08/29/2019   Procedure: ESOPHAGOGASTRODUODENOSCOPY (EGD) WITH PROPOFOL;  Surgeon: Doran Stabler, MD;  Location: [place];  Service: Gastroenterology;  Laterality: N/A;   EUS N/A 03/11/2015   Procedure: LOWER ENDOSCOPIC ULTRASOUND (EUS);  Surgeon: Milus Banister, MD;  Location[facility];  Service: Endoscopy;  Laterality: N/A;   FLEXIBLE SIGMOIDOSCOPY N/A 02/02/2015   Procedure: FLEXIBLE SIGMOIDOSCOPY;  Surgeon: Inda Castle, MD;  Location: [facility];  Service: Endoscopy;  Laterality:  N/A;  ERBE   HEMOSTASIS CLIP PLACEMENT  08/29/2019   Procedure: HEMOSTASIS CLIP PLACEMENT;  Surgeon: Doran Stabler, MD;  Location: [facility];  Service: Gastroenterology;;   HOT HEMOSTASIS N/A 08/29/2019   Procedure: HOT HEMOSTASIS (ARGON PLASMA COAGULATION/BICAP);  Surgeon: Doran Stabler, MD;  Location: [place];  Service: Gastroenterology;  Laterality: N/A;   LEFT HEART CATH AND CORONARY ANGIOGRAPHY N/A 11/20/2018   Procedure: LEFT HEART CATH AND CORONARY ANGIOGRAPHY;  Surgeon: Burnell Blanks, MD;  Location: [place] [facility];  Service: Cardiovascular;  Laterality: N/A;   LOOP RECORDER INSERTION N/A 08/14/2017   Procedure: LOOP RECORDER INSERTION;  Surgeon: Thompson Grayer, MD;  Location: [place] [facility];  Service: Cardiovascular;  Laterality: N/A;   PARTIAL PROCTECTOMY BY TEM N/A 04/08/2015   Procedure: TEM PARTIAL PROCTECTOMY OF RECTAL MASS;  Surgeon: Michael Boston, MD;  Location: [facility];  Service: General;  Laterality: N/A;   POLYPECTOMY  08/29/2019   Procedure: POLYPECTOMY;  Surgeon: Doran Stabler, MD;  Location[facility];  Service: Gastroenterology;;   TEE WITHOUT CARDIOVERSION N/A 06/25/2017   Procedure: TRANSESOPHAGEAL ECHOCARDIOGRAM (TEE);  Surgeon: Skeet Latch, MD;  Location: [place];  Service: Cardiovascular;  Laterality: N/A;   TEE WITHOUT CARDIOVERSION N/A 11/26/2018   Procedure: TRANSESOPHAGEAL ECHOCARDIOGRAM (TEE);  Surgeon: Gaye Pollack, MD;  Location: [place];  Service: Open Heart Surgery;  Laterality: N/A;   TONSILLECTOMY AND ADENOIDECTOMY     as child    Current Outpatient Medications  Medication Sig Dispense Refill   amLODipine (NORVASC) 5 MG tablet Take 1 tablet (5 mg total) by mouth daily. Pt needs to keep upcoming appt in Feb, 2023 for further refills 90 tablet 1   apixaban (ELIQUIS) 5 MG TABS tablet Take 1 tablet (5 mg total) by mouth 2 (two) times daily. 60 tablet 5   apixaban (ELIQUIS) 5 MG TABS tablet      fenofibrate  micronized (LOFIBRA) 67 MG capsule TAKE 1 CAPSULE BY MOUTH DAILY BEFORE BREAKFAST. SCHEDULE PHYSICAL 90 capsule 0   glucose blood (ONETOUCH VERIO) test strip 1 each by Other route in the morning and at bedtime. And lancets 2/day 100 each 3   hydrALAZINE (APRESOLINE) 100 MG tablet Take 1 tablet (100 mg total) by mouth 3 (three) times daily. Patient must keep 11/21/2021 appointment for further refills 270 tablet 1   insulin glargine-yfgn (SEMGLEE, YFGN,) 100 UNIT/ML Pen Inject 60 Units into the skin every morning. 60 mL 3   isosorbide mononitrate (IMDUR) 60 MG 24 hr tablet Take 1 tablet (60 mg total) by mouth daily. Pt needs to keep upcoming appt in Feb, 2023 for further refills 90 tablet 1   Lancets (ONETOUCH ULTRASOFT) lancets Use as instructed 100 each 12   metoprolol tartrate (LOPRESSOR) 25 MG tablet TAKE 1 AND 1/2 TABLETS BY MOUTH TWICE A DAY NEEDS APPT FOR REFILLS 270 tablet 2   pantoprazole (PROTONIX) 40 MG tablet Take 1 tablet (40 mg total) by mouth daily before breakfast. 30 tablet 0  rosuvastatin (CRESTOR) 20 MG tablet TAKE 1 TABLET BY MOUTH AT BEDTIME. SCHEDULE PHYSICAL EXAM 30 tablet 0   tamsulosin (FLOMAX) 0.4 MG CAPS capsule TAKE 1 CAPSULE BY MOUTH EVERY DAY 90 capsule 1   torsemide (DEMADEX) 20 MG tablet Take 1 tablet (20 mg total) by mouth 2 (two) times daily. Pt needs to keep upcoming appt in Feb, 2023 for further refills 180 tablet 1   No current facility-administered medications for this visit.    No Known Allergies  Social History   Socioeconomic History   Marital status: Divorced    Spouse name: Not on file   Number of children: 2   Years of education: Not on file   Highest education level: Not on file  Occupational History   Occupation: retired    Fish farm manager: [employer]  Tobacco Use   Smoking status: Former    Types: Cigarettes    Quit date: 08/13/1994    Years since quitting: 27.0   Smokeless tobacco: Never  Vaping Use   Vaping Use: Never used  Substance and Sexual  Activity   Alcohol use: No    Alcohol/week: 0.0 standard drinks   Drug use: No   Sexual activity: Not Currently    Birth control/protection: None  Other Topics Concern   Not on file  Social History Narrative   Not on file   Social Determinants of Health   Financial Resource Strain: Not on file  Food Insecurity: No Food Insecurity   Worried About Charity fundraiser in the Last Year: Never true   [place] in the Last Year: Never true  Transportation Needs: No Transportation Needs   Lack of Transportation (Medical): No   Lack of Transportation (Non-Medical): No  Physical Activity: Not on file  Stress: Not on file  Social Connections: Not on file  Intimate Partner Violence: Not on file    Family History  Problem Relation Age of Onset   Cancer Father        "all over"   Coronary artery disease Brother    Diabetes Brother    Stroke Mother    Diabetes Mother    Cancer Brother    Colon cancer Neg Hx    Esophageal cancer Neg Hx    Rectal cancer Neg Hx    Stomach cancer Neg Hx     Review of Systems:  As stated in the HPI and otherwise negative.   BP 122/62 (BP Location: Left Arm, Patient Position: Sitting, Cuff Size: Normal)   Pulse (!) 59   Ht 5\' 7"  (1.702 m)   Wt 211 lb 3.2 oz (95.8 kg)   SpO2 96%   BMI 33.08 kg/m   Physical Examination:  General: Well developed, well nourished, NAD  HEENT: OP clear, mucus membranes moist  SKIN: warm, dry. No rashes. Neuro: No focal deficits  Musculoskeletal: Muscle strength 5/5 all ext  Psychiatric: Mood and affect normal  Neck: No JVD, no carotid bruits, no thyromegaly, no lymphadenopathy.  Lungs:Clear bilaterally, no wheezes, rhonci, crackles Cardiovascular: Regular rate and rhythm. No murmurs, gallops or rubs. Abdomen:Soft. Bowel sounds present. Non-tender.  Extremities: No lower extremity edema. Pulses are 2 + in the bilateral DP/PT.    EKG:  EKG is not ordered today. The ekg ordered today demonstrates    Recent Labs: 06/27/2021: ALT 12; ALT 12; Brain Natriuretic Peptide 422; Hemoglobin 12.2; Platelets 297 07/18/2021: NT-Pro BNP 1,363 07/20/2021: BUN 58; Creatinine, Ser 2.32; Potassium 5.4; Sodium 141   Lipid Panel  Component Value Date/Time   CHOL 155 06/27/2021 1252   TRIG 197 (H) 06/27/2021 1252   HDL 29 (L) 06/27/2021 1252   CHOLHDL 5.3 (H) 06/27/2021 1252   VLDL 42.8 (H) 11/12/2018 1528   LDLCALC 97 06/27/2021 1252   LDLDIRECT 80.0 11/12/2018 1528     Wt Readings from Last 3 Encounters:  08/08/21 211 lb 3.2 oz (95.8 kg)  07/18/21 220 lb 3.2 oz (99.9 kg)  06/28/21 221 lb 9.6 oz (100.5 kg)     Assessment and Plan:   1. CAD s/p CABG without angina: No chest pain. Normal LV function on recent echo. Continue ASA, statin, beta blocker and Imdur.   2. HTN: BP is controlled. No changes  3. Paroxysmal atrial fibrillation: Followed in our EP clinic by Dr. Rayann Heman. Continue beta blocker and Eliquis  4. Acute on Chronic diastolic CHF: Weight is down 9 lbs over three weeks. Will continue Torsemide 20 mg po BID.    5. 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   Current medicines are reviewed at length with the patient today.  The patient does not have concerns regarding medicines.  The following changes have been made:  no change  Labs/ tests ordered today include:   No orders of the defined types were placed in this encounter.   Disposition:   F/U with me in 6 months   Signed, Lauree Chandler, MD 08/08/2021 10:44 AM    [facility] [address], [place], [place]  [address] Phone: [phone]; Fax: [phone]

## 2021-08-08 NOTE — Patient Instructions (Signed)

## 2021-08-10 DIAGNOSIS — I509 Heart failure, unspecified: Secondary | ICD-10-CM | POA: Diagnosis not present

## 2021-08-10 DIAGNOSIS — I129 Hypertensive chronic kidney disease with stage 1 through stage 4 chronic kidney disease, or unspecified chronic kidney disease: Secondary | ICD-10-CM | POA: Diagnosis not present

## 2021-08-10 DIAGNOSIS — D631 Anemia in chronic kidney disease: Secondary | ICD-10-CM | POA: Diagnosis not present

## 2021-08-10 DIAGNOSIS — I4891 Unspecified atrial fibrillation: Secondary | ICD-10-CM | POA: Diagnosis not present

## 2021-08-10 DIAGNOSIS — E559 Vitamin D deficiency, unspecified: Secondary | ICD-10-CM | POA: Diagnosis not present

## 2021-08-10 DIAGNOSIS — E1122 Type 2 diabetes mellitus with diabetic chronic kidney disease: Secondary | ICD-10-CM | POA: Diagnosis not present

## 2021-08-10 DIAGNOSIS — N189 Chronic kidney disease, unspecified: Secondary | ICD-10-CM | POA: Diagnosis not present

## 2021-08-10 DIAGNOSIS — N184 Chronic kidney disease, stage 4 (severe): Secondary | ICD-10-CM | POA: Diagnosis not present

## 2021-08-10 DIAGNOSIS — E785 Hyperlipidemia, unspecified: Secondary | ICD-10-CM | POA: Diagnosis not present

## 2021-08-18 ENCOUNTER — Ambulatory Visit
Admission: RE | Admit: 2021-08-18 | Discharge: 2021-08-18 | Disposition: A | Discharge status: Home / Self Care | Payer: Medicare Other | Source: Ambulatory Visit | Attending: Nephrology | Admitting: Nephrology

## 2021-08-18 ENCOUNTER — Other Ambulatory Visit: Payer: Self-pay | Admitting: Nephrology

## 2021-08-18 ENCOUNTER — Other Ambulatory Visit: Payer: Self-pay

## 2021-08-18 DIAGNOSIS — Z951 Presence of aortocoronary bypass graft: Secondary | ICD-10-CM | POA: Diagnosis not present

## 2021-08-18 DIAGNOSIS — I5030 Unspecified diastolic (congestive) heart failure: Secondary | ICD-10-CM | POA: Diagnosis not present

## 2021-08-23 ENCOUNTER — Encounter: Payer: Medicare Other | Attending: Endocrinology | Admitting: Nutrition

## 2021-08-23 ENCOUNTER — Other Ambulatory Visit: Payer: Self-pay

## 2021-08-23 DIAGNOSIS — Z794 Long term (current) use of insulin: Secondary | ICD-10-CM | POA: Diagnosis not present

## 2021-08-23 DIAGNOSIS — E119 Type 2 diabetes mellitus without complications: Secondary | ICD-10-CM | POA: Diagnosis present

## 2021-08-23 DIAGNOSIS — N184 Chronic kidney disease, stage 4 (severe): Secondary | ICD-10-CM | POA: Diagnosis not present

## 2021-08-23 NOTE — Progress Notes (Signed)
Patient is here today to review his diet and blood sugar readings.  He did not bring his meter.  Says since he started the [facility], his blood sugars have been running higher.   SBGM: testing only once a day.  Says FBS today was 167, and yesterday was 137.  Medication:  Semglee:  60u q AM (6AM) Diet: 6AM: 2 eggs with onion and penpers.  NO bread and diet drink 12:00PM: sandwich: tomato, or hamburger, or hot dog.  No sides of any kind and diet drink 3:00PM: sleep for 1 hour 4:30:  Pinto beans and cornbread, or chicken and veg., pork chop and green veg.  Usually no bread with supper.  Diet drink 9PM: diet drink.  Denies snacking after supper.    Plan:  test stips given to help him test bid--ac and/or HS Exercise.  ADL  stressed need for walking.  To work up to 20 min. Every day.

## 2021-08-23 NOTE — Patient Instructions (Signed)
Test blood sugar twice daily.  Before meals or at bedtime.   Walk for exercise.  Work up to 20 min. 4-5X/wk.

## 2021-08-24 ENCOUNTER — Telehealth: Payer: Self-pay | Admitting: Physician Assistant

## 2021-08-24 NOTE — Telephone Encounter (Signed)
Scheduled appt per 11/7 referral. Pt is aware of appt date and time.

## 2021-08-30 NOTE — Telephone Encounter (Signed)
**Note De-Identified  Obfuscation** Jaycob Gonyer Key: [id number] - PA Case ID: [id number] - Rx #: [id number] Outcome: This request has been approved using information available on the patient's profile. NWGNFA:[id number];QIONGE:[id number];Review Type:Prior Auth;Coverage Start Date:06/20/2021;Coverage End Date:07/20/2022 Drug: Eliquis 5MG  tablets Form: [employer] Electronic PA Form (2017 NCPDP) Original Claim Info  [facility] is aware of this approval.

## 2021-09-02 ENCOUNTER — Other Ambulatory Visit: Payer: Self-pay | Admitting: Family

## 2021-09-07 DIAGNOSIS — D631 Anemia in chronic kidney disease: Secondary | ICD-10-CM | POA: Diagnosis not present

## 2021-09-07 DIAGNOSIS — I5030 Unspecified diastolic (congestive) heart failure: Secondary | ICD-10-CM | POA: Diagnosis not present

## 2021-09-07 DIAGNOSIS — I4891 Unspecified atrial fibrillation: Secondary | ICD-10-CM | POA: Diagnosis not present

## 2021-09-07 DIAGNOSIS — I129 Hypertensive chronic kidney disease with stage 1 through stage 4 chronic kidney disease, or unspecified chronic kidney disease: Secondary | ICD-10-CM | POA: Diagnosis not present

## 2021-09-07 DIAGNOSIS — E559 Vitamin D deficiency, unspecified: Secondary | ICD-10-CM | POA: Diagnosis not present

## 2021-09-07 DIAGNOSIS — E1122 Type 2 diabetes mellitus with diabetic chronic kidney disease: Secondary | ICD-10-CM | POA: Diagnosis not present

## 2021-09-07 DIAGNOSIS — E785 Hyperlipidemia, unspecified: Secondary | ICD-10-CM | POA: Diagnosis not present

## 2021-09-07 DIAGNOSIS — N184 Chronic kidney disease, stage 4 (severe): Secondary | ICD-10-CM | POA: Diagnosis not present

## 2021-09-09 ENCOUNTER — Other Ambulatory Visit: Payer: Self-pay

## 2021-09-09 DIAGNOSIS — D649 Anemia, unspecified: Secondary | ICD-10-CM

## 2021-09-11 ENCOUNTER — Other Ambulatory Visit: Payer: Self-pay | Admitting: Physician Assistant

## 2021-09-11 DIAGNOSIS — D472 Monoclonal gammopathy: Secondary | ICD-10-CM

## 2021-09-11 NOTE — Progress Notes (Signed)
[place] [phone]   [phone]  CONSULT NOTE  REFERRING PHYSICIAN: Dr. Justin Mend  REASON FOR CONSULTATION:  M-Spike, Myeloma rule out  HPI Jake Gross is a 80 y.o. male with a past medical history significant for stage III/IV chronic kidney disease, hypertension, CVA, colon cancer in 2016, unstable angina, diabetes, paroxysmal atrial fibrillation, AVMs/GI bleed in 2020, hyperlipidemia, coronary artery disease status post CABG, and iron deficiency anemia is referred to the clinic for an M spike.  The patient follows with Dr. Justin Mend from [facility] for his stage III/IV chronic kidney disease. The patient was seen by their clinic on 08/19/2021.  The patient had lab work performed on 08/10/2021.  The patient's lab work shows increased urine protein/creatinine ratio at 3671.  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  The IgG was within normal limits at 876, the IgA was elevated at 520, and the IgM was elevated at 287.  Of note, he had an SPEP performed a few months ago in which he did not have an M-spike. His kidney function most recently has been around 2.2.  His GFR was 29.  He has low albumin at 3.5, elevated potassium, normal sodium, and normal calcium.  He was referred to the clinic for multiple myeloma rule out.  Overall, the patient is feeling fairly well. He is accompanied by his daughter, Jake Gross today. His only complaint with his health is his baseline dyspnea on exertion. He denies any changes with his breathing the last few months. He also has intermittent right lower  swelling which waxes and wanes. This has flared up in the last two days. He denies calf pain or erythema. He is on Eliquis and is compliant with this. He had this evaluated last month by his cardiologist which was negative for DVT. He denies any significant fatigue.  He denies any  abnormal bleeding including epistaxis, gingival bleeding, hematemesis, melena, hematochezia, or hematuria.  He does have a history of a GI bleed in 2020 due to AVMs but does not recall this hospitalization.  He is not on any vitamin supplements at this time.  He does have bilateral upper extremity bruising from his blood thinner use.  Denies any frequent or recent infections.  He denies any chills, night sweats, or unexplained weight loss.   He denies any lymphadenopathy.  He denies any abdominal fullness or early satiety.  Denies any headache, visual changes, or peripheral neuropathy.  Denies any history of fractures.  The patient denies any family history of any bone marrow or blood disorders.  The patient's brother has metastatic skin cancer.  The patient's daughter incidentally was found to have kidney cancer s/p resection.  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  He is divorced and has 2 children.  He is a former smoker having smoked approximately 32 years averaging 1 pack of cigarettes per day.  He also used to drink beer frequently but quit drinking also at the age of 30.  The patient denies any history or current drug use.  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    Arthritis    AVM (arteriovenous malformation) of small bowel, 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   CAD (coronary artery disease) 09/29/2019   Cervical spondylosis  Chronic anticoagulation    Chronic diastolic CHF (congestive heart failure) ([place]) 12/17/2018   CKD (chronic kidney disease) 12/17/2018   Diabetes mellitus    History of GI bleed 09/29/2019   Hyperlipidemia    Hypertension    Iron deficiency anemia due to chronic blood loss    Paroxysmal atrial fibrillation ([place]) 12/17/2018   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   Stroke (cerebrum) ([place]) 06/20/2017   Vitamin D deficiency      Past Surgical History:  Procedure Laterality Date   ABCESS DRAINAGE Left    buttocks   CARDIOVASCULAR STRESS TEST  10/12/1999   EF 63%. NO ISCHEMIA   COLONOSCOPY W/ POLYPECTOMY     5 polyps   COLONOSCOPY WITH PROPOFOL N/A 08/29/2019   Procedure: COLONOSCOPY WITH PROPOFOL;  Surgeon: Doran Stabler, MD;  Location: [place];  Service: Gastroenterology;  Laterality: N/A;   CORONARY ARTERY BYPASS GRAFT N/A 11/26/2018   Procedure: CORONARY ARTERY BYPASS GRAFTING (CABG), ON PUMP, TIMES FOUR, USING LEFT INTERNAL MAMMARY ARTERY AND ENDOSCOPICALLY HARVESTED LEFT SAPHENOUS VEIN;  Surgeon: Gaye Pollack, MD;  Location: [place];  Service: Open Heart Surgery;  Laterality: N/A;   ESOPHAGOGASTRODUODENOSCOPY (EGD) WITH PROPOFOL N/A 08/29/2019   Procedure: ESOPHAGOGASTRODUODENOSCOPY (EGD) WITH PROPOFOL;  Surgeon: Doran Stabler, MD;  Location: [place];  Service: Gastroenterology;  Laterality: N/A;   EUS N/A 03/11/2015   Procedure: LOWER ENDOSCOPIC ULTRASOUND (EUS);  Surgeon: Milus Banister, MD;  Location: [facility];  Service: Endoscopy;  Laterality: N/A;   FLEXIBLE SIGMOIDOSCOPY N/A 02/02/2015   Procedure: FLEXIBLE SIGMOIDOSCOPY;  Surgeon: Inda Castle, MD;  Location: [facility];  Service: Endoscopy;  Laterality: N/A;  ERBE   HEMOSTASIS CLIP PLACEMENT  08/29/2019   Procedure: HEMOSTASIS CLIP PLACEMENT;  Surgeon: Doran Stabler, MD;  Location: [place] [facility];  Service: Gastroenterology;;   HOT HEMOSTASIS N/A 08/29/2019   Procedure: HOT HEMOSTASIS (ARGON PLASMA COAGULATION/BICAP);  Surgeon: Doran Stabler, MD;  Location: [place];  Service: Gastroenterology;  Laterality: N/A;   LEFT HEART CATH AND CORONARY ANGIOGRAPHY N/A 11/20/2018   Procedure: LEFT HEART CATH AND CORONARY ANGIOGRAPHY;  Surgeon: Burnell Blanks, MD;  Location: [place] [facility];  Service: Cardiovascular;  Laterality: N/A;   LOOP RECORDER INSERTION N/A 08/14/2017   Procedure: LOOP RECORDER  INSERTION;  Surgeon: Thompson Grayer, MD;  Location: [place] [facility];  Service: Cardiovascular;  Laterality: N/A;   PARTIAL PROCTECTOMY BY TEM N/A 04/08/2015   Procedure: TEM PARTIAL PROCTECTOMY OF RECTAL MASS;  Surgeon: Michael Boston, MD;  Location: [facility];  Service: General;  Laterality: N/A;   POLYPECTOMY  08/29/2019   Procedure: POLYPECTOMY;  Surgeon: Doran Stabler, MD;  Location: [facility];  Service: Gastroenterology;;   TEE WITHOUT CARDIOVERSION N/A 06/25/2017   Procedure: TRANSESOPHAGEAL ECHOCARDIOGRAM (TEE);  Surgeon: Skeet Latch, MD;  Location: [place];  Service: Cardiovascular;  Laterality: N/A;   TEE WITHOUT CARDIOVERSION N/A 11/26/2018   Procedure: TRANSESOPHAGEAL ECHOCARDIOGRAM (TEE);  Surgeon: Gaye Pollack, MD;  Location: [place];  Service: Open Heart Surgery;  Laterality: N/A;   TONSILLECTOMY AND ADENOIDECTOMY     as child    Family History  Problem Relation Age of Onset   Cancer Father        "all over"   Coronary artery disease Brother    Diabetes Brother    Stroke Mother    Diabetes Mother    Cancer Brother    Colon cancer Neg Hx  Esophageal cancer Neg Hx    Rectal cancer Neg Hx    Stomach cancer Neg Hx     Social History Social History   Tobacco Use   Smoking status: Former    Types: Cigarettes    Quit date: 08/13/1994    Years since quitting: 27.1   Smokeless tobacco: Never  Vaping Use   Vaping Use: Never used  Substance Use Topics   Alcohol use: No    Alcohol/week: 0.0 standard drinks   Drug use: No    No Known Allergies  Current Outpatient Medications  Medication Sig Dispense Refill   amLODipine (NORVASC) 5 MG tablet Take 1 tablet (5 mg total) by mouth daily. Pt needs to keep upcoming appt in Feb, 2023 for further refills 90 tablet 1   apixaban (ELIQUIS) 5 MG TABS tablet Take 1 tablet (5 mg total) by mouth 2 (two) times daily. 60 tablet 5   fenofibrate micronized (LOFIBRA) 67 MG capsule TAKE 1 CAPSULE BY MOUTH DAILY BEFORE  BREAKFAST. SCHEDULE PHYSICAL 90 capsule 0   glucose blood (ONETOUCH VERIO) test strip 1 each by Other route in the morning and at bedtime. And lancets 2/day 100 each 3   hydrALAZINE (APRESOLINE) 100 MG tablet Take 1 tablet (100 mg total) by mouth 3 (three) times daily. Patient must keep 11/21/2021 appointment for further refills 270 tablet 1   insulin glargine-yfgn (SEMGLEE, YFGN,) 100 UNIT/ML Pen Inject 60 Units into the skin every morning. 60 mL 3   isosorbide mononitrate (IMDUR) 60 MG 24 hr tablet Take 1 tablet (60 mg total) by mouth daily. Pt needs to keep upcoming appt in Feb, 2023 for further refills 90 tablet 1   Lancets (ONETOUCH ULTRASOFT) lancets Use as instructed 100 each 12   metoprolol tartrate (LOPRESSOR) 25 MG tablet TAKE 1 AND 1/2 TABLETS BY MOUTH TWICE A DAY NEEDS APPT FOR REFILLS 270 tablet 2   rosuvastatin (CRESTOR) 20 MG tablet TAKE 1 TABLET BY MOUTH AT BEDTIME. SCHEDULE PHYSICAL EXAM 30 tablet 0   tamsulosin (FLOMAX) 0.4 MG CAPS capsule TAKE 1 CAPSULE BY MOUTH EVERY DAY 90 capsule 1   torsemide (DEMADEX) 20 MG tablet Take 1 tablet (20 mg total) by mouth 2 (two) times daily. Pt needs to keep upcoming appt in Feb, 2023 for further refills 180 tablet 1   No current facility-administered medications for this visit.    REVIEW OF SYSTEMS:   Review of Systems  Constitutional: Negative for appetite change, chills, fatigue, fever and unexpected weight change.  HENT: Negative for mouth sores, nosebleeds, sore throat and trouble swallowing.   Eyes: Negative for eye problems and icterus.  Respiratory: Positive for chronic shortness of breath with exertion.  Negative for cough, hemoptysis, and wheezing.   Cardiovascular: Negative for chest pain.  Positive for lower extremity swelling. Gastrointestinal: Negative for abdominal pain, constipation, diarrhea, nausea and vomiting.  Genitourinary: Negative for bladder incontinence, difficulty urinating, dysuria, frequency and hematuria.    Musculoskeletal: Negative for back pain, gait problem, neck pain and neck stiffness.  Skin: Negative for itching and rash.  Neurological: Negative for dizziness, extremity weakness, gait problem, headaches, light-headedness and seizures.  Hematological: Negative for adenopathy.  Positive for senile purpura of the upper extremities. Psychiatric/Behavioral: Negative for confusion, depression and sleep disturbance. The patient is not nervous/anxious.     PHYSICAL EXAMINATION:  Blood pressure (!) 170/59, pulse (!) 58, temperature (!) 97.5 F (36.4 C), temperature source Tympanic, resp. rate 18, weight 214 lb 9 oz (97.3 kg), SpO2 98 %.  ECOG PERFORMANCE STATUS: 1  Physical Exam  Constitutional: Oriented to person, place, and time and well-developed, well-nourished, and in no distress.  HENT:  Head: Normocephalic and atraumatic.  Mouth/Throat: Oropharynx is clear and moist. No oropharyngeal exudate.  Eyes: Conjunctivae are normal. Right eye exhibits no discharge. Left eye exhibits no discharge. No scleral icterus.  Neck: Normal range of motion. Neck supple.  Cardiovascular: Bradycardic, regular rhythm, normal heart sounds and intact distal pulses.   Pulmonary/Chest: Effort normal and breath sounds normal. No respiratory distress. No wheezes. No rales.  Abdominal: Soft. Bowel sounds are normal. Exhibits no distension and no mass. There is no tenderness.  Musculoskeletal: Normal range of motion.  Lower extremity pitting edema (R>L).  No calf pain to palpation or erythema. Lymphadenopathy:    No cervical adenopathy.  Neurological: Alert and oriented to person, place, and time. Exhibits normal muscle tone. Gait normal. Coordination normal.  Skin: Positive for bilateral upper extremity bruising/senile purpura.  Skin is warm and dry. No rash noted. Not diaphoretic. No erythema. No pallor.  Psychiatric: Mood, memory and judgment normal.  Vitals reviewed.  LABORATORY DATA: Lab Results  Component  Value Date   WBC 9.5 09/12/2021   HGB 10.8 (L) 09/12/2021   HCT 34.5 (L) 09/12/2021   MCV 83.1 09/12/2021   PLT 224 09/12/2021      Chemistry      Component Value Date/Time   NA 143 09/12/2021 1301   NA 141 07/20/2021 1552   K 4.7 09/12/2021 1301   CL 113 (H) 09/12/2021 1301   CO2 20 (L) 09/12/2021 1301   BUN 59 (H) 09/12/2021 1301   BUN 58 (H) 07/20/2021 1552   CREATININE 2.16 (H) 09/12/2021 1301   CREATININE 1.88 (H) 06/27/2021 1252   CREATININE 1.88 (H) 06/27/2021 1252   GLU 207 01/19/2016 0000      Component Value Date/Time   CALCIUM 9.0 09/12/2021 1301   ALKPHOS 87 09/12/2021 1301   AST 16 09/12/2021 1301   ALT 10 09/12/2021 1301   BILITOT 0.5 09/12/2021 1301       RADIOGRAPHIC STUDIES: DG Chest 2 View  Result Date: 08/18/2021 CLINICAL DATA:  Diastolic congestive heart failure Increasing s.o.b, CABG 2020 EXAM: CHEST - 2 VIEW COMPARISON:  06/27/2021 FINDINGS: Lungs are clear. Heart size and mediastinal contours are within normal limits. CABG markers. No effusion.  No pneumothorax. Sternotomy wires. Implanted event monitor in the anterior left chest wall. IMPRESSION: No acute cardiopulmonary disease Post CABG. Electronically Signed   By: Lucrezia Europe M.D.   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 Julien Nordmann today.  The patient had several lab studies performed including CBC, CMP, LDH, serum kappa/lambda light chains, quantitative immunoglobulins, and beta-2 microglobulin.  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    We will wait for the results of his myeloma panel.  If it does not look concerning for multiple myeloma, we would recommend that the patient continue on observation with a repeat myeloma panel in 6 months.  If  the patient's myeloma panel looks concerning for multiple myeloma, we will reach out to him for the next steps which would likely include a bone marrow biopsy and aspirate.   I will arrange for the  follow-up visit once I have the results of his myeloma panel will personally call the patient later this week.  The patient continues to have persistent/stable anemia with a hemoglobin of 10.6.  Dr. Julien Nordmann feels that this is likely secondary to his CKD. His ferritin is 99 and iron studies show a total iron on the low end of normal at 42 with a slightly low saturation of 16%, and normal TIBC of 260.  The patient is followed closely by Dr. Justin Mend from [facility] and we will defer to him regarding when/if the patient would be a candidate for EPO injections at their clinic since he sees them regularly.  The patient voices understanding of current disease status and treatment options and is in agreement with the current care plan.  All questions were answered. The patient knows to call the clinic with any problems, questions or concerns. We can certainly see the patient much sooner if necessary.  Thank you so much for allowing me to participate in the care of [employer]. I will continue to follow up the patient with you and assist in his care.   Disclaimer: This note was dictated with voice recognition software. Similar sounding words can inadvertently be transcribed and may not be corrected upon review.   Classie Weng L Zita Ozimek September 12, 2021, 2:50 PM  ADDENDUM: Hematology/Oncology Attending: Had a face-to-face encounter with the patient today.  I reviewed his record, lab and recommended his care plan.  This is a very pleasant 80 years old white male with multiple medical problems including stage IV chronic kidney disease, hypertension, stroke, colon cancer in 2016, unstable angina, diabetes, atrial fibrillation, AV malformation with GI bleed in 2020, dyslipidemia, coronary  artery disease status post CABG as well as history of iron deficiency anemia.  The patient was seen by his nephrologist Dr. Justin Mend for evaluation of his chronic kidney disease and during his evaluation he had increased urine protein/creatinine ratio of 367.1.  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  He had a slightly elevated level of IgA and IgM.  The patient was referred to me today for evaluation and recommendation regarding his condition. When seen today the patient is feeling fine today with no concerning complaints except for shortness of breath with exertion. I had a lengthy discussion with the patient today about his current condition and possible treatment options. I order repeat myeloma panel today.  If the patient has concerning findings on the myeloma panel, we may consider him for a bone marrow biopsy and aspirate.  It is possible that his elevated monoclonal gammopathy is secondary to his chronic kidney disease but also MGUS or underlying multiple myeloma could not be completely excluded. Will call the patient with the result and if needed will arrange a follow-up appointment after considering a bone marrow biopsy and aspirate. The patient and his daughter agreed to the current plan. He was advised to call immediately if he has any other concerning symptoms in the interval.  The total time spent in the appointment was 60 minutes. Eilleen Kempf, MD 09/12/21

## 2021-09-12 ENCOUNTER — Other Ambulatory Visit: Payer: Self-pay

## 2021-09-12 ENCOUNTER — Inpatient Hospital Stay: Payer: Medicare Other

## 2021-09-12 ENCOUNTER — Inpatient Hospital Stay: Payer: Medicare Other | Attending: Physician Assistant | Admitting: Physician Assistant

## 2021-09-12 ENCOUNTER — Encounter: Payer: Self-pay | Admitting: Physician Assistant

## 2021-09-12 DIAGNOSIS — D472 Monoclonal gammopathy: Secondary | ICD-10-CM

## 2021-09-12 DIAGNOSIS — R778 Other specified abnormalities of plasma proteins: Secondary | ICD-10-CM | POA: Diagnosis not present

## 2021-09-12 DIAGNOSIS — D649 Anemia, unspecified: Secondary | ICD-10-CM | POA: Insufficient documentation

## 2021-09-12 LAB — CBC WITH DIFFERENTIAL/PLATELET
Abs Immature Granulocytes: 0.04 10*3/uL (ref 0.00–0.07)
Basophils Absolute: 0 10*3/uL (ref 0.0–0.1)
Basophils Relative: 0 %
Eosinophils Absolute: 0.3 10*3/uL (ref 0.0–0.5)
Eosinophils Relative: 3 %
HCT: 34.5 % — ABNORMAL LOW (ref 39.0–52.0)
Hemoglobin: 10.8 g/dL — ABNORMAL LOW (ref 13.0–17.0)
Immature Granulocytes: 0 %
Lymphocytes Relative: 23 %
Lymphs Abs: 2.2 10*3/uL (ref 0.7–4.0)
MCH: 26 pg (ref 26.0–34.0)
MCHC: 31.3 g/dL (ref 30.0–36.0)
MCV: 83.1 fL (ref 80.0–100.0)
Monocytes Absolute: 1 10*3/uL (ref 0.1–1.0)
Monocytes Relative: 10 %
Neutro Abs: 6 10*3/uL (ref 1.7–7.7)
Neutrophils Relative %: 64 %
Platelets: 224 10*3/uL (ref 150–400)
RBC: 4.15 MIL/uL — ABNORMAL LOW (ref 4.22–5.81)
RDW: 14.6 % (ref 11.5–15.5)
WBC: 9.5 10*3/uL (ref 4.0–10.5)
nRBC: 0 % (ref 0.0–0.2)

## 2021-09-12 LAB — COMPREHENSIVE METABOLIC PANEL
ALT: 10 U/L (ref 0–44)
AST: 16 U/L (ref 15–41)
Albumin: 3 g/dL — ABNORMAL LOW (ref 3.5–5.0)
Alkaline Phosphatase: 87 U/L (ref 38–126)
Anion gap: 10 (ref 5–15)
BUN: 59 mg/dL — ABNORMAL HIGH (ref 8–23)
CO2: 20 mmol/L — ABNORMAL LOW (ref 22–32)
Calcium: 9 mg/dL (ref 8.9–10.3)
Chloride: 113 mmol/L — ABNORMAL HIGH (ref 98–111)
Creatinine, Ser: 2.16 mg/dL — ABNORMAL HIGH (ref 0.61–1.24)
GFR, Estimated: 31 mL/min — ABNORMAL LOW (ref >60)
Glucose, Bld: 162 mg/dL — ABNORMAL HIGH (ref 70–99)
Potassium: 4.7 mmol/L (ref 3.5–5.1)
Sodium: 143 mmol/L (ref 135–145)
Total Bilirubin: 0.5 mg/dL (ref 0.3–1.2)
Total Protein: 7.3 g/dL (ref 6.5–8.1)

## 2021-09-12 LAB — IRON AND TIBC
Iron: 42 ug/dL (ref 42–163)
Saturation Ratios: 16 % — ABNORMAL LOW (ref 20–55)
TIBC: 260 ug/dL (ref 202–409)
UIBC: 218 ug/dL (ref 117–376)

## 2021-09-12 LAB — FERRITIN: Ferritin: 99 ng/mL (ref 24–336)

## 2021-09-12 LAB — LACTATE DEHYDROGENASE: LDH: 178 U/L (ref 98–192)

## 2021-09-13 LAB — KAPPA/LAMBDA LIGHT CHAINS
Kappa free light chain: 111.1 mg/L — ABNORMAL HIGH (ref 3.3–19.4)
Kappa, lambda light chain ratio: 2.37 — ABNORMAL HIGH (ref 0.26–1.65)
Lambda free light chains: 46.8 mg/L — ABNORMAL HIGH (ref 5.7–26.3)

## 2021-09-13 LAB — IGG, IGA, IGM
IgA: 507 mg/dL — ABNORMAL HIGH (ref 61–437)
IgG (Immunoglobin G), Serum: 933 mg/dL (ref 603–1613)
IgM (Immunoglobulin M), Srm: 204 mg/dL — ABNORMAL HIGH (ref 15–143)

## 2021-09-13 LAB — BETA 2 MICROGLOBULIN, SERUM: Beta-2 Microglobulin: 6.7 mg/L — ABNORMAL HIGH (ref 0.6–2.4)

## 2021-09-14 ENCOUNTER — Telehealth: Payer: Self-pay | Admitting: Physician Assistant

## 2021-09-14 ENCOUNTER — Other Ambulatory Visit: Payer: Self-pay | Admitting: Physician Assistant

## 2021-09-14 ENCOUNTER — Telehealth: Payer: Self-pay | Admitting: Internal Medicine

## 2021-09-14 DIAGNOSIS — R778 Other specified abnormalities of plasma proteins: Secondary | ICD-10-CM

## 2021-09-14 NOTE — Telephone Encounter (Signed)
Scheduled per sch msg. Called and left msg  

## 2021-09-14 NOTE — Telephone Encounter (Signed)
I called the patient to review his myeloma panel. I also reviewed it with Dr. Julien Nordmann. Dr. Julien Nordmann would like to move forward with a bone marrow biopsy and aspirate to rule out multiple myeloma. We will see him back for a follow up visit 1 week after this procedure to review the results. He expressed understanding with the instructions.

## 2021-09-29 ENCOUNTER — Other Ambulatory Visit: Payer: Self-pay | Admitting: Family

## 2021-10-05 ENCOUNTER — Encounter: Payer: Medicare Other | Admitting: Nurse Practitioner

## 2021-10-06 ENCOUNTER — Other Ambulatory Visit: Payer: Medicare Other

## 2021-10-06 ENCOUNTER — Ambulatory Visit: Payer: Medicare Other | Admitting: Internal Medicine

## 2021-10-28 ENCOUNTER — Other Ambulatory Visit: Payer: Self-pay | Admitting: Radiology

## 2021-10-28 ENCOUNTER — Ambulatory Visit (INDEPENDENT_AMBULATORY_CARE_PROVIDER_SITE_OTHER): Payer: Medicare Other | Admitting: Nurse Practitioner

## 2021-10-28 ENCOUNTER — Other Ambulatory Visit: Payer: Self-pay

## 2021-10-28 VITALS — BP 168/72 | HR 53 | Temp 98.2°F | Resp 16 | Ht 67.0 in | Wt 214.0 lb

## 2021-10-28 DIAGNOSIS — Z794 Long term (current) use of insulin: Secondary | ICD-10-CM | POA: Diagnosis not present

## 2021-10-28 DIAGNOSIS — D500 Iron deficiency anemia secondary to blood loss (chronic): Secondary | ICD-10-CM | POA: Diagnosis not present

## 2021-10-28 DIAGNOSIS — E119 Type 2 diabetes mellitus without complications: Secondary | ICD-10-CM | POA: Diagnosis not present

## 2021-10-28 DIAGNOSIS — R0602 Shortness of breath: Secondary | ICD-10-CM

## 2021-10-28 DIAGNOSIS — Z23 Encounter for immunization: Secondary | ICD-10-CM

## 2021-10-28 DIAGNOSIS — I10 Essential (primary) hypertension: Secondary | ICD-10-CM

## 2021-10-28 DIAGNOSIS — Z0000 Encounter for general adult medical examination without abnormal findings: Secondary | ICD-10-CM | POA: Diagnosis not present

## 2021-10-28 DIAGNOSIS — Z7901 Long term (current) use of anticoagulants: Secondary | ICD-10-CM | POA: Diagnosis not present

## 2021-10-28 DIAGNOSIS — R351 Nocturia: Secondary | ICD-10-CM

## 2021-10-28 DIAGNOSIS — E782 Mixed hyperlipidemia: Secondary | ICD-10-CM | POA: Diagnosis not present

## 2021-10-28 DIAGNOSIS — N184 Chronic kidney disease, stage 4 (severe): Secondary | ICD-10-CM | POA: Diagnosis not present

## 2021-10-28 NOTE — Patient Instructions (Addendum)
Nice to see you today Check with your pharmacy about the shingles vaccine as it is much cheaper I will send a message to your cardiologist in regards to cost of medication  Follow up with me in 6 months, sooner if needed

## 2021-10-28 NOTE — Progress Notes (Signed)
Established Patient Office Visit  Subjective:  Patient ID: Jake Gross, male    DOB: [birth year]  Age: 81 y.o. MRN: [id number]  CC:  Chief Complaint  Patient presents with   Medicare Wellness    HPI [employer] presents for [employer] annual wellness  for complete physical and follow up of chronic conditions.  Immunizations: -Tetanus:10/20/2015 -Influenza:06/27/2021 -Covid-19: pfizer x3 -Shingles: information given to get at pharmacy -Pneumonia: PCV 20 today. No record of other PNA  -HPV: aged out  Diet: [place]. Eats breakfast lunch supper. Not a snacker. Diet dr. Malachi Bonds (x2) and water. Exercise: No regular exercise.  Eye exam: Completes annually  Dental exam:  full set of dentures  Dexa: Never completed Colonoscopy: Completed in 2020 aged out PSA: done in sept 2022. Patient has a urologist  Lung Cancer Screening: NA   PHQ9 SCORE ONLY 10/28/2021 09/22/2020 11/19/2018  PHQ-9 Total Score 0 0 0   No flowsheet data found.      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   CAD (coronary artery disease) 09/29/2019   Cervical spondylosis    Chronic anticoagulation    Chronic diastolic CHF (congestive heart failure) ([place]) 12/17/2018   CKD (chronic kidney disease) 12/17/2018   Diabetes mellitus    History of GI bleed 09/29/2019   Hyperlipidemia    Hypertension    Iron deficiency anemia due to chronic blood loss    Paroxysmal atrial fibrillation ([place]) 12/17/2018   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 NO ISCHEMIA   COLONOSCOPY W/ POLYPECTOMY     5 polyps   COLONOSCOPY WITH PROPOFOL N/A 08/29/2019   Procedure: COLONOSCOPY WITH PROPOFOL;  Surgeon: Doran Stabler, MD;  Location: [place];  Service: Gastroenterology;  Laterality: N/A;   CORONARY ARTERY BYPASS GRAFT N/A 11/26/2018   Procedure: CORONARY ARTERY BYPASS GRAFTING (CABG), ON PUMP, TIMES FOUR, USING LEFT INTERNAL MAMMARY ARTERY AND ENDOSCOPICALLY HARVESTED LEFT SAPHENOUS VEIN;  Surgeon: Gaye Pollack, MD;  Location: [place];  Service: Open Heart Surgery;  Laterality: N/A;   ESOPHAGOGASTRODUODENOSCOPY (EGD) WITH PROPOFOL N/A 08/29/2019   Procedure: ESOPHAGOGASTRODUODENOSCOPY (EGD) WITH PROPOFOL;  Surgeon: Doran Stabler, MD;  Location: [place];  Service: Gastroenterology;  Laterality: N/A;   EUS N/A 03/11/2015   Procedure: LOWER ENDOSCOPIC ULTRASOUND (EUS);  Surgeon: Milus Banister, MD;  Location: [facility];  Service: Endoscopy;  Laterality: N/A;   FLEXIBLE SIGMOIDOSCOPY N/A 02/02/2015   Procedure: FLEXIBLE SIGMOIDOSCOPY;  Surgeon: Inda Castle, MD;  Location: [facility];  Service: Endoscopy;  Laterality: N/A;  ERBE   HEMOSTASIS CLIP PLACEMENT  08/29/2019   Procedure: HEMOSTASIS CLIP PLACEMENT;  Surgeon: Doran Stabler, MD;  Location: [place] [facility];  Service: Gastroenterology;;   HOT HEMOSTASIS N/A 08/29/2019   Procedure: HOT HEMOSTASIS (ARGON PLASMA COAGULATION/BICAP);  Surgeon: Doran Stabler, MD;  Location: [place];  Service: Gastroenterology;  Laterality: N/A;   LEFT HEART CATH AND CORONARY ANGIOGRAPHY N/A 11/20/2018   Procedure: LEFT HEART CATH AND CORONARY ANGIOGRAPHY;  Surgeon: Burnell Blanks, MD;  Location: [place] [facility];  Service: Cardiovascular;  Laterality: N/A;   LOOP RECORDER INSERTION N/A 08/14/2017   Procedure: LOOP RECORDER  INSERTION;  Surgeon: Thompson Grayer, MD;  Location: [place] [facility];  Service: Cardiovascular;  Laterality: N/A;   PARTIAL PROCTECTOMY BY TEM N/A 04/08/2015   Procedure: TEM PARTIAL PROCTECTOMY OF RECTAL MASS;  Surgeon: Michael Boston, MD;  Location: [facility];  Service: General;  Laterality: N/A;   POLYPECTOMY  08/29/2019   Procedure: POLYPECTOMY;  Surgeon: Doran Stabler, MD;  Location: [facility];  Service: Gastroenterology;;   TEE WITHOUT CARDIOVERSION N/A 06/25/2017   Procedure: TRANSESOPHAGEAL ECHOCARDIOGRAM (TEE);  Surgeon: Skeet Latch, MD;  Location: [place];  Service: Cardiovascular;  Laterality: N/A;   TEE WITHOUT CARDIOVERSION N/A 11/26/2018   Procedure: TRANSESOPHAGEAL ECHOCARDIOGRAM (TEE);  Surgeon: Gaye Pollack, MD;  Location: [place];  Service: Open Heart Surgery;  Laterality: N/A;   TONSILLECTOMY AND ADENOIDECTOMY     as child    Family History  Problem Relation Age of Onset   Cancer Father        "all over"   Coronary artery disease Brother    Diabetes Brother    Stroke Mother    Diabetes Mother    Cancer Brother    Colon cancer Neg Hx    Esophageal cancer Neg Hx    Rectal cancer Neg Hx    Stomach cancer Neg Hx     Social History   Socioeconomic History   Marital status: Divorced    Spouse name: Not on file   Number of children: 2   Years of education: Not on file   Highest education level: Not on file  Occupational History   Occupation: retired    Fish farm manager: [employer]  Tobacco Use   Smoking status: Former    Types: Cigarettes    Quit date: 08/13/1994    Years since quitting: 27.2   Smokeless tobacco: Never  Vaping Use   Vaping Use: Never used  Substance and Sexual Activity   Alcohol use: No    Alcohol/week: 0.0 standard drinks   Drug use: No   Sexual activity: Not Currently    Birth control/protection: None  Other Topics Concern   Not on file  Social History Narrative   Not on file   Social Determinants of Health   Financial Resource  Strain: Not on file  Food Insecurity: Not on file  Transportation Needs: Not on file  Physical Activity: Not on file  Stress: Not on file  Social Connections: Not on file  Intimate Partner Violence: Not on file    Outpatient Medications Prior to Visit  Medication Sig Dispense Refill   amLODipine (NORVASC) 5 MG tablet Take 1 tablet (5 mg total) by mouth daily. Pt needs to keep upcoming appt in Feb, 2023 for further refills 90 tablet 1   apixaban (ELIQUIS) 5 MG TABS tablet Take 1 tablet (5 mg total) by mouth 2 (two) times daily. 60 tablet 5   fenofibrate micronized (LOFIBRA) 67 MG capsule TAKE 1 CAPSULE BY MOUTH DAILY BEFORE BREAKFAST. SCHEDULE PHYSICAL 90 capsule 0   glucose blood (ONETOUCH VERIO) test strip 1 each by Other route in the morning and at bedtime. And lancets 2/day 100 each 3  hydrALAZINE (APRESOLINE) 100 MG tablet Take 1 tablet (100 mg total) by mouth 3 (three) times daily. Patient must keep 11/21/2021 appointment for further refills 270 tablet 1   insulin glargine-yfgn (SEMGLEE, YFGN,) 100 UNIT/ML Pen Inject 60 Units into the skin every morning. 60 mL 3   isosorbide mononitrate (IMDUR) 60 MG 24 hr tablet Take 1 tablet (60 mg total) by mouth daily. Pt needs to keep upcoming appt in Feb, 2023 for further refills 90 tablet 1   Lancets (ONETOUCH ULTRASOFT) lancets Use as instructed 100 each 12   metoprolol tartrate (LOPRESSOR) 25 MG tablet TAKE 1 AND 1/2 TABLETS BY MOUTH TWICE A DAY NEEDS APPT FOR REFILLS 270 tablet 2   rosuvastatin (CRESTOR) 20 MG tablet TAKE 1 TABLET BY MOUTH AT BEDTIME. SCHEDULE PHYSICAL EXAM 30 tablet 0   tamsulosin (FLOMAX) 0.4 MG CAPS capsule TAKE 1 CAPSULE BY MOUTH EVERY DAY 90 capsule 1   torsemide (DEMADEX) 20 MG tablet Take 1 tablet (20 mg total) by mouth 2 (two) times daily. Pt needs to keep upcoming appt in Feb, 2023 for further refills 180 tablet 1   No facility-administered medications prior to visit.    No Known Allergies  ROS Review of Systems   Constitutional:  Positive for fatigue. Negative for chills and fever.  HENT:  Negative for congestion, ear discharge, ear pain, sore throat and tinnitus.   Respiratory:  Positive for shortness of breath (states approx 4 months ago. States that he becomes short of breath and with DOE). Negative for cough and chest tightness.   Cardiovascular:  Positive for leg swelling. Negative for chest pain.  Gastrointestinal:  Negative for abdominal pain, diarrhea, nausea and vomiting.       BM daily  Genitourinary:  Negative for dysuria, frequency, hematuria, penile pain, penile swelling and testicular pain.       Nocturia x 5-6  Neurological:  Negative for dizziness, weakness, light-headedness, numbness and headaches.  Psychiatric/Behavioral:  Negative for hallucinations and suicidal ideas.      Objective:    Physical Exam Vitals and nursing note reviewed.  Constitutional:      Appearance: He is obese.  HENT:     Right Ear: Tympanic membrane, ear canal and external ear normal.     Left Ear: Tympanic membrane, ear canal and external ear normal.     Mouth/Throat:     Mouth: Mucous membranes are moist.  Eyes:     Extraocular Movements: Extraocular movements intact.     Pupils: Pupils are equal, round, and reactive to light.  Neck:     Vascular: No carotid bruit.  Cardiovascular:     Rate and Rhythm: Normal rate and regular rhythm.  Pulmonary:     Effort: Pulmonary effort is normal.     Breath sounds: Normal breath sounds.  Abdominal:     General: Bowel sounds are normal. There is no distension.     Palpations: There is no mass.     Tenderness: There is no abdominal tenderness.  Musculoskeletal:     Right lower leg: 2+ Edema present.     Left lower leg: 1+ Edema present.  Lymphadenopathy:     Cervical: No cervical adenopathy.  Neurological:     Mental Status: He is alert.     Deep Tendon Reflexes:     Reflex Scores:      Bicep reflexes are 2+ on the right side and 2+ on the left  side.      Patellar reflexes are 2+ on  the right side and 2+ on the left side.    Comments: Bilateral upper and lower extremity strength 5/5  Psychiatric:        Mood and Affect: Mood normal.        Behavior: Behavior normal.        Thought Content: Thought content normal.        Judgment: Judgment normal.    BP (!) 168/72    Pulse (!) 53    Temp 98.2 F (36.8 C)    Resp 16    Ht '5\' 7"'  (1.702 m)    Wt 214 lb (97.1 kg)    SpO2 95%    BMI 33.52 kg/m  Wt Readings from Last 3 Encounters:  10/28/21 214 lb (97.1 kg)  09/12/21 214 lb 9 oz (97.3 kg)  08/08/21 211 lb 3.2 oz (95.8 kg)     Health Maintenance Due  Topic Date Due   Pneumonia Vaccine 47+ Years old (1 - PCV) Never done   Hepatitis C Screening  Never done   Zoster Vaccines- Shingrix (1 of 2) Never done   OPHTHALMOLOGY EXAM  01/14/2018   COVID-19 Vaccine (4 - Booster for Pfizer series) 11/26/2020    There are no preventive care reminders to display for this patient.  Lab Results  Component Value Date   TSH 1.445 08/27/2019   Lab Results  Component Value Date   WBC 9.5 09/12/2021   HGB 10.8 (L) 09/12/2021   HCT 34.5 (L) 09/12/2021   MCV 83.1 09/12/2021   PLT 224 09/12/2021   Lab Results  Component Value Date   NA 143 09/12/2021   K 4.7 09/12/2021   CO2 20 (L) 09/12/2021   GLUCOSE 162 (H) 09/12/2021   BUN 59 (H) 09/12/2021   CREATININE 2.16 (H) 09/12/2021   BILITOT 0.5 09/12/2021   ALKPHOS 87 09/12/2021   AST 16 09/12/2021   ALT 10 09/12/2021   PROT 7.3 09/12/2021   ALBUMIN 3.0 (L) 09/12/2021   CALCIUM 9.0 09/12/2021   ANIONGAP 10 09/12/2021   EGFR 28 (L) 07/20/2021   GFR 20.97 (L) 09/08/2019   Lab Results  Component Value Date   CHOL 155 06/27/2021   Lab Results  Component Value Date   HDL 29 (L) 06/27/2021   Lab Results  Component Value Date   LDLCALC 97 06/27/2021   Lab Results  Component Value Date   TRIG 197 (H) 06/27/2021   Lab Results  Component Value Date   CHOLHDL 5.3 (H)  06/27/2021   Lab Results  Component Value Date   HGBA1C 7.8 (A) 06/28/2021      Assessment & Plan:   Problem List Items Addressed This Visit       Cardiovascular and Mediastinum   Essential hypertension    Patient is managed by cardiology. BP above goal in office         Endocrine   Diabetes mellitus type 2, insulin dependent [facility])    Patient is managed by Dr. Renato Shin. COntinue following up with hi as recommended and take the medications as prescribed        Genitourinary   CKD (chronic kidney disease)    Patient is followed by Nephrology. Continue to follow up as recommended         Other   HLD (hyperlipidemia)    Currently on rosuvastatin and fenofibrate. Last office visit he mentioned that he was out of fenofibrate but when I reviewed the medications he endorsed it. Pending labs  Relevant Orders   Lipid panel (Completed)   Iron deficiency anemia due to chronic blood loss    Currently followed by heme/onc. Has a a bone marrow biopsy scheduled for Monday 10/31/2021      Chronic anticoagulation    Patient states that he is currently on eliquis and it is really expensive. This is managed by cardiology I will reach out to them for him to see about using warfarin as another option       Shortness of breath    Baseline per patient. Will check labs and follow up with cardiology       Relevant Orders   Brain natriuretic peptide   CBC (Completed)   Comprehensive metabolic panel (Completed)   Nocturia    PSA checked back in 06/2021 was slightly elevated. States he is followed by urology and already on tamsulosin       Medicare annual wellness visit, subsequent - Primary    Discussed appropriate immunizations and health screening exams. Reviewed MAW paper work and sent to be scanned into the chart.      Other Visit Diagnoses     Need for vaccination against Streptococcus pneumoniae       Relevant Orders   Pneumococcal conjugate vaccine 20-valent  (Prevnar 20) (Completed)       No orders of the defined types were placed in this encounter.   Follow-up: Follow up in 6 months   This visit occurred during the SARS-CoV-2 public health emergency.  Safety protocols were in place, including screening questions prior to the visit, additional usage of staff PPE, and extensive cleaning of exam room while observing appropriate contact time as indicated for disinfecting solutions.    Romilda Garret, NP

## 2021-10-29 ENCOUNTER — Encounter: Payer: Self-pay | Admitting: Nurse Practitioner

## 2021-10-29 DIAGNOSIS — Z0000 Encounter for general adult medical examination without abnormal findings: Secondary | ICD-10-CM | POA: Insufficient documentation

## 2021-10-29 LAB — CBC
HCT: 35.2 % — ABNORMAL LOW (ref 38.5–50.0)
Hemoglobin: 11.2 g/dL — ABNORMAL LOW (ref 13.2–17.1)
MCH: 26.2 pg — ABNORMAL LOW (ref 27.0–33.0)
MCHC: 31.8 g/dL — ABNORMAL LOW (ref 32.0–36.0)
MCV: 82.2 fL (ref 80.0–100.0)
MPV: 11.9 fL (ref 7.5–12.5)
Platelets: 234 10*3/uL (ref 140–400)
RBC: 4.28 10*6/uL (ref 4.20–5.80)
RDW: 14.4 % (ref 11.0–15.0)
WBC: 9.4 10*3/uL (ref 3.8–10.8)

## 2021-10-29 LAB — COMPREHENSIVE METABOLIC PANEL
AG Ratio: 1.1 (calc) (ref 1.0–2.5)
ALT: 14 U/L (ref 9–46)
AST: 19 U/L (ref 10–35)
Albumin: 3.5 g/dL — ABNORMAL LOW (ref 3.6–5.1)
Alkaline phosphatase (APISO): 87 U/L (ref 35–144)
BUN/Creatinine Ratio: 26 (calc) — ABNORMAL HIGH (ref 6–22)
BUN: 61 mg/dL — ABNORMAL HIGH (ref 7–25)
CO2: 23 mmol/L (ref 20–32)
Calcium: 9.2 mg/dL (ref 8.6–10.3)
Chloride: 110 mmol/L (ref 98–110)
Creat: 2.33 mg/dL — ABNORMAL HIGH (ref 0.70–1.28)
Globulin: 3.3 g/dL (calc) (ref 1.9–3.7)
Glucose, Bld: 154 mg/dL — ABNORMAL HIGH (ref 65–99)
Potassium: 5.3 mmol/L (ref 3.5–5.3)
Sodium: 143 mmol/L (ref 135–146)
Total Bilirubin: 0.5 mg/dL (ref 0.2–1.2)
Total Protein: 6.8 g/dL (ref 6.1–8.1)

## 2021-10-29 LAB — LIPID PANEL
Cholesterol: 158 mg/dL (ref <200)
HDL: 29 mg/dL — ABNORMAL LOW (ref >=40)
LDL Cholesterol (Calc): 98 mg/dL (calc)
Non-HDL Cholesterol (Calc): 129 mg/dL (calc) (ref <130)
Total CHOL/HDL Ratio: 5.4 (calc) — ABNORMAL HIGH (ref <5.0)
Triglycerides: 225 mg/dL — ABNORMAL HIGH (ref <150)

## 2021-10-29 LAB — BRAIN NATRIURETIC PEPTIDE: Brain Natriuretic Peptide: 267 pg/mL — ABNORMAL HIGH (ref <100)

## 2021-10-29 NOTE — Assessment & Plan Note (Signed)
Patient is managed by Dr. Renato Shin. COntinue following up with hi as recommended and take the medications as prescribed

## 2021-10-29 NOTE — Assessment & Plan Note (Signed)
Baseline per patient. Will check labs and follow up with cardiology

## 2021-10-29 NOTE — Assessment & Plan Note (Signed)
Currently on rosuvastatin and fenofibrate. Last office visit he mentioned that he was out of fenofibrate but when I reviewed the medications he endorsed it. Pending labs

## 2021-10-29 NOTE — Assessment & Plan Note (Signed)
Patient is managed by cardiology. BP above goal in office

## 2021-10-29 NOTE — Assessment & Plan Note (Signed)
Currently followed by heme/onc. Has a a bone marrow biopsy scheduled for Monday 10/31/2021

## 2021-10-29 NOTE — Assessment & Plan Note (Signed)
Patient is followed by Nephrology. Continue to follow up as recommended

## 2021-10-29 NOTE — Assessment & Plan Note (Signed)
PSA checked back in 06/2021 was slightly elevated. States he is followed by urology and already on tamsulosin

## 2021-10-29 NOTE — Assessment & Plan Note (Signed)
Patient states that he is currently on eliquis and it is really expensive. This is managed by cardiology I will reach out to them for him to see about using warfarin as another option

## 2021-10-29 NOTE — Assessment & Plan Note (Signed)
Discussed appropriate immunizations and health screening exams. Reviewed MAW paper work and sent to be scanned into the chart.

## 2021-10-31 ENCOUNTER — Other Ambulatory Visit: Payer: Self-pay

## 2021-10-31 ENCOUNTER — Ambulatory Visit (HOSPITAL_COMMUNITY)
Admission: RE | Admit: 2021-10-31 | Discharge: 2021-10-31 | Disposition: A | Discharge status: Home / Self Care | Payer: Medicare Other | Source: Ambulatory Visit | Attending: Physician Assistant | Admitting: Physician Assistant

## 2021-10-31 DIAGNOSIS — D72822 Plasmacytosis: Secondary | ICD-10-CM | POA: Diagnosis not present

## 2021-10-31 DIAGNOSIS — D649 Anemia, unspecified: Secondary | ICD-10-CM | POA: Diagnosis not present

## 2021-10-31 DIAGNOSIS — I5032 Chronic diastolic (congestive) heart failure: Secondary | ICD-10-CM | POA: Diagnosis not present

## 2021-10-31 DIAGNOSIS — R779 Abnormality of plasma protein, unspecified: Secondary | ICD-10-CM | POA: Diagnosis not present

## 2021-10-31 DIAGNOSIS — Z85038 Personal history of other malignant neoplasm of large intestine: Secondary | ICD-10-CM | POA: Diagnosis not present

## 2021-10-31 DIAGNOSIS — N189 Chronic kidney disease, unspecified: Secondary | ICD-10-CM | POA: Insufficient documentation

## 2021-10-31 DIAGNOSIS — D509 Iron deficiency anemia, unspecified: Secondary | ICD-10-CM | POA: Diagnosis not present

## 2021-10-31 DIAGNOSIS — I130 Hypertensive heart and chronic kidney disease with heart failure and stage 1 through stage 4 chronic kidney disease, or unspecified chronic kidney disease: Secondary | ICD-10-CM | POA: Insufficient documentation

## 2021-10-31 DIAGNOSIS — Z951 Presence of aortocoronary bypass graft: Secondary | ICD-10-CM | POA: Diagnosis not present

## 2021-10-31 DIAGNOSIS — I4891 Unspecified atrial fibrillation: Secondary | ICD-10-CM | POA: Insufficient documentation

## 2021-10-31 DIAGNOSIS — E1122 Type 2 diabetes mellitus with diabetic chronic kidney disease: Secondary | ICD-10-CM | POA: Diagnosis not present

## 2021-10-31 DIAGNOSIS — R778 Other specified abnormalities of plasma proteins: Secondary | ICD-10-CM | POA: Diagnosis present

## 2021-10-31 DIAGNOSIS — I2510 Atherosclerotic heart disease of native coronary artery without angina pectoris: Secondary | ICD-10-CM | POA: Diagnosis not present

## 2021-10-31 DIAGNOSIS — C9000 Multiple myeloma not having achieved remission: Secondary | ICD-10-CM | POA: Diagnosis not present

## 2021-10-31 DIAGNOSIS — D472 Monoclonal gammopathy: Secondary | ICD-10-CM | POA: Diagnosis not present

## 2021-10-31 DIAGNOSIS — E785 Hyperlipidemia, unspecified: Secondary | ICD-10-CM | POA: Insufficient documentation

## 2021-10-31 LAB — GLUCOSE, CAPILLARY: Glucose-Capillary: 161 mg/dL — ABNORMAL HIGH (ref 70–99)

## 2021-10-31 LAB — CBC
HCT: 37.7 % — ABNORMAL LOW (ref 39.0–52.0)
Hemoglobin: 12 g/dL — ABNORMAL LOW (ref 13.0–17.0)
MCH: 26.6 pg (ref 26.0–34.0)
MCHC: 31.8 g/dL (ref 30.0–36.0)
MCV: 83.6 fL (ref 80.0–100.0)
Platelets: 211 10*3/uL (ref 150–400)
RBC: 4.51 MIL/uL (ref 4.22–5.81)
RDW: 15.5 % (ref 11.5–15.5)
WBC: 9.2 10*3/uL (ref 4.0–10.5)
nRBC: 0 % (ref 0.0–0.2)

## 2021-10-31 LAB — PROTIME-INR
INR: 1.2 (ref 0.8–1.2)
Prothrombin Time: 15.3 seconds — ABNORMAL HIGH (ref 11.4–15.2)

## 2021-10-31 MED ORDER — MIDAZOLAM HCL 2 MG/2ML IJ SOLN
INTRAMUSCULAR | Status: AC | PRN
Start: 2021-10-31 — End: 2021-10-31
  Administered 2021-10-31 (×2): 1 mg via INTRAVENOUS

## 2021-10-31 MED ORDER — SODIUM CHLORIDE 0.9 % IV SOLN: INTRAVENOUS | Status: DC

## 2021-10-31 MED ORDER — FENTANYL CITRATE (PF) 100 MCG/2ML IJ SOLN
INTRAMUSCULAR | Status: AC | PRN
Start: 2021-10-31 — End: 2021-10-31
  Administered 2021-10-31 (×2): 50 ug via INTRAVENOUS

## 2021-10-31 MED ORDER — FENTANYL CITRATE (PF) 100 MCG/2ML IJ SOLN
INTRAMUSCULAR | Status: AC
  Filled 2021-10-31: qty 2

## 2021-10-31 MED ORDER — MIDAZOLAM HCL 2 MG/2ML IJ SOLN
INTRAMUSCULAR | Status: AC
  Filled 2021-10-31: qty 4

## 2021-10-31 NOTE — H&P (Signed)
Chief Complaint: Elevated M Spike. Request is for bone marrow biopsy   Referring Physician(s): Heilingoetter,Cassandra L  Supervising Physician: Jacqulynn Cadet  Patient Status: [facility] - Out-pt  History of Present Illness: Jake Gross is a 81 y.o. male outpatient. History of CKD, HTN, CVA, Colon cancer, angina, DM, A fib, AVS /GI bleed, HLD, CAD s.p CABG, anemia. Found to have abnormal myeloma workup  (M spike). Team is requesting a bone marrow biopsy for further evaluation of possible multiple myeloma.   Currently without any significant complaints. Patient alert and laying in bed, calm and comfortable. Denies any fevers, headache, chest pain, SOB, cough, abdominal pain, nausea, vomiting or bleeding. Return precautions and treatment recommendations and follow-up discussed with the patient  who is agreeable with the plan.     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   CAD (coronary artery disease) 09/29/2019   Cervical spondylosis    Chronic anticoagulation    Chronic diastolic CHF (congestive heart failure) ([place]) 12/17/2018   CKD (chronic kidney disease) 12/17/2018   Diabetes mellitus    History of GI bleed 09/29/2019   Hyperlipidemia    Hypertension    Iron deficiency anemia due to chronic blood loss    Paroxysmal atrial fibrillation ([place]) 12/17/2018   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 NO ISCHEMIA   COLONOSCOPY W/ POLYPECTOMY     5 polyps   COLONOSCOPY WITH PROPOFOL N/A 08/29/2019   Procedure: COLONOSCOPY WITH PROPOFOL;   Surgeon: Doran Stabler, MD;  Location: [place];  Service: Gastroenterology;  Laterality: N/A;   CORONARY ARTERY BYPASS GRAFT N/A 11/26/2018   Procedure: CORONARY ARTERY BYPASS GRAFTING (CABG), ON PUMP, TIMES FOUR, USING LEFT INTERNAL MAMMARY ARTERY AND ENDOSCOPICALLY HARVESTED LEFT SAPHENOUS VEIN;  Surgeon: Gaye Pollack, MD;  Location: [place];  Service: Open Heart Surgery;  Laterality: N/A;   ESOPHAGOGASTRODUODENOSCOPY (EGD) WITH PROPOFOL N/A 08/29/2019   Procedure: ESOPHAGOGASTRODUODENOSCOPY (EGD) WITH PROPOFOL;  Surgeon: Doran Stabler, MD;  Location: [place];  Service: Gastroenterology;  Laterality: N/A;   EUS N/A 03/11/2015   Procedure: LOWER ENDOSCOPIC ULTRASOUND (EUS);  Surgeon: Milus Banister, MD;  [facility];  Service: Endoscopy;  Laterality: N/A;   FLEXIBLE SIGMOIDOSCOPY N/A 02/02/2015   Procedure: FLEXIBLE SIGMOIDOSCOPY;  Surgeon: Inda Castle, MD;  [facility];  Service: Endoscopy;  Laterality: N/A;  ERBE   HEMOSTASIS CLIP PLACEMENT  08/29/2019   Procedure: HEMOSTASIS CLIP PLACEMENT;  Surgeon: Doran Stabler, MD;  Location: [place] [facility];  Service: Gastroenterology;;   HOT HEMOSTASIS N/A 08/29/2019   Procedure: HOT HEMOSTASIS (ARGON PLASMA COAGULATION/BICAP);  Surgeon: Doran Stabler, MD;  Location: [place];  Service: Gastroenterology;  Laterality: N/A;   LEFT HEART CATH AND CORONARY ANGIOGRAPHY N/A 11/20/2018   Procedure: LEFT HEART CATH AND CORONARY ANGIOGRAPHY;  Surgeon: Burnell Blanks, MD;  Location: [place] CV LAB;  Service: Cardiovascular;  Laterality: N/A;   LOOP RECORDER INSERTION N/A 08/14/2017   Procedure: LOOP RECORDER INSERTION;  Surgeon:  Thompson Grayer, MD;  Location: [place] [facility];  Service: Cardiovascular;  Laterality: N/A;   PARTIAL PROCTECTOMY BY TEM N/A 04/08/2015   Procedure: TEM PARTIAL PROCTECTOMY OF RECTAL MASS;  Surgeon: Michael Boston, MD;  [facility];  Service: General;  Laterality: N/A;    POLYPECTOMY  08/29/2019   Procedure: POLYPECTOMY;  Surgeon: Doran Stabler, MD;  Location: [facility]COPY;  Service: Gastroenterology;;   TEE WITHOUT CARDIOVERSION N/A 06/25/2017   Procedure: TRANSESOPHAGEAL ECHOCARDIOGRAM (TEE);  Surgeon: Skeet Latch, MD;  Location: [place];  Service: Cardiovascular;  Laterality: N/A;   TEE WITHOUT CARDIOVERSION N/A 11/26/2018   Procedure: TRANSESOPHAGEAL ECHOCARDIOGRAM (TEE);  Surgeon: Gaye Pollack, MD;  Location: [place];  Service: Open Heart Surgery;  Laterality: N/A;   TONSILLECTOMY AND ADENOIDECTOMY     as child    Allergies: Patient has no known allergies.  Medications: Prior to Admission medications   Medication Sig Start Date End Date Taking? Authorizing Provider  amLODipine (NORVASC) 5 MG tablet Take 1 tablet (5 mg total) by mouth daily. Pt needs to keep upcoming appt in Feb, 2023 for further refills 06/06/21  Yes McAlhany, Annita Brod, MD  apixaban (ELIQUIS) 5 MG TABS tablet Take 1 tablet (5 mg total) by mouth 2 (two) times daily. 07/18/21  Yes Burnell Blanks, MD  fenofibrate micronized (LOFIBRA) 67 MG capsule TAKE 1 CAPSULE BY MOUTH DAILY BEFORE BREAKFAST. SCHEDULE PHYSICAL 04/28/20  Yes Baity, Coralie Keens, NP  hydrALAZINE (APRESOLINE) 100 MG tablet Take 1 tablet (100 mg total) by mouth 3 (three) times daily. Patient must keep 11/21/2021 appointment for further refills 06/07/21  Yes Imogene Burn, PA-C  insulin glargine-yfgn (SEMGLEE, YFGN,) 100 UNIT/ML Pen Inject 60 Units into the skin every morning. 07/18/21  Yes Renato Shin, MD  isosorbide mononitrate (IMDUR) 60 MG 24 hr tablet Take 1 tablet (60 mg total) by mouth daily. Pt needs to keep upcoming appt in Feb, 2023 for further refills 06/06/21  Yes McAlhany, Annita Brod, MD  metoprolol tartrate (LOPRESSOR) 25 MG tablet TAKE 1 AND 1/2 TABLETS BY MOUTH TWICE A DAY NEEDS APPT FOR REFILLS 02/28/21  Yes Allred, Jeneen Rinks, MD  rosuvastatin (CRESTOR) 20 MG tablet TAKE 1 TABLET BY MOUTH  AT BEDTIME. SCHEDULE PHYSICAL EXAM 09/02/21  Yes Dutch Quint B, FNP  tamsulosin (FLOMAX) 0.4 MG CAPS capsule TAKE 1 CAPSULE BY MOUTH EVERY DAY 12/01/19  Yes Baity, Coralie Keens, NP  torsemide (DEMADEX) 20 MG tablet Take 1 tablet (20 mg total) by mouth 2 (two) times daily. Pt needs to keep upcoming appt in Feb, 2023 for further refills 06/06/21  Yes McAlhany, Annita Brod, MD  glucose blood (ONETOUCH VERIO) test strip 1 each by Other route in the morning and at bedtime. And lancets 2/day 04/27/21   Renato Shin, MD  Lancets [name] ULTRASOFT) lancets Use as instructed 04/27/21   Renato Shin, MD     Family History  Problem Relation Age of Onset   Cancer Father        "all over"   Coronary artery disease Brother    Diabetes Brother    Stroke Mother    Diabetes Mother    Cancer Brother    Colon cancer Neg Hx    Esophageal cancer Neg Hx    Rectal cancer Neg Hx    Stomach cancer Neg Hx     Social History   Socioeconomic History   Marital status: Divorced    Spouse name: Not on file   Number of children:  2   Years of education: Not on file   Highest education level: Not on file  Occupational History   Occupation: retired    Fish farm manager: [employer]  Tobacco Use   Smoking status: Former    Types: Cigarettes    Quit date: 08/13/1994    Years since quitting: 27.2   Smokeless tobacco: Never  Vaping Use   Vaping Use: Never used  Substance and Sexual Activity   Alcohol use: No    Alcohol/week: 0.0 standard drinks   Drug use: No   Sexual activity: Not Currently    Birth control/protection: None  Other Topics Concern   Not on file  Social History Narrative   Not on file   Social Determinants of Health   Financial Resource Strain: Not on file  Food Insecurity: Not on file  Transportation Needs: Not on file  Physical Activity: Not on file  Stress: Not on file  Social Connections: Not on file    Review of Systems: A 12 point ROS discussed and pertinent positives are indicated in the  HPI above.  All other systems are negative.  Review of Systems  Constitutional:  Negative for fever.  HENT:  Negative for congestion.   Respiratory:  Negative for cough and shortness of breath.   Cardiovascular:  Negative for chest pain.  Gastrointestinal:  Negative for abdominal pain.  Neurological:  Negative for headaches.  Psychiatric/Behavioral:  Negative for behavioral problems and confusion.    Vital Signs: There were no vitals taken for this visit.  Physical Exam Vitals and nursing note reviewed.  Constitutional:      Appearance: He is well-developed.  HENT:     Head: Normocephalic.  Cardiovascular:     Rate and Rhythm: Regular rhythm.     Heart sounds: Normal heart sounds.  Pulmonary:     Effort: Pulmonary effort is normal.  Musculoskeletal:        General: Normal range of motion.     Cervical back: Normal range of motion.  Skin:    General: Skin is dry.     Findings: Bruising present.  Neurological:     Mental Status: He is alert and oriented to person, place, and time.    Imaging: No results found.  Labs:  CBC: Recent Labs    06/27/21 1252 09/12/21 1301 10/28/21 1510 10/31/21 1020  WBC 11.3* 9.5 9.4 9.2  HGB 12.2* 10.8* 11.2* 12.0*  HCT 37.4* 34.5* 35.2* 37.7*  PLT 297 224 234 211    COAGS: No results for input(s): INR, APTT in the last 8760 hours.  BMP: Recent Labs    07/18/21 1037 07/20/21 1552 09/12/21 1301 10/28/21 1510  NA 143 141 143 143  K 5.7* 5.4* 4.7 5.3  CL 110* 110* 113* 110  CO2 19* 23 20* 23  GLUCOSE 153* 222* 162* 154*  BUN 45* 58* 59* 61*  CALCIUM 9.0 9.0 9.0 9.2  CREATININE 1.90* 2.32* 2.16* 2.33*  GFRNONAA  --   --  31*  --     LIVER FUNCTION TESTS: Recent Labs    06/27/21 1252 09/12/21 1301 10/28/21 1510  BILITOT 0.6   0.6 0.5 0.5  AST '19   19 16 19  ' ALT '12   12 10 14  ' ALKPHOS  --  87  --   PROT 6.6   6.6 7.3 6.8  ALBUMIN  --  3.0*  --      Assessment and Plan:  81 y.o. male outpatient. History of  CKD, HTN, CVA,  Colon cancer, angina, DM, A fib, AVS /GI bleed, HLD, CAD s.p CABG, anemia. Found to have abnormal myeloma workup  (M spike). Team is requesting a bone marrow biopsy for further evaluation of possible multiple myeloma.   Labs from 1.13.23 read BUN 61, CKD 2.33, BNP 267, albumin 2.5. Patient is on eliquis. Last dose 1.15.23. NKDA. Patient has been NPO since midnight.  Risks and benefits of bone marrow biopsy was discussed with the patient and/or patient's family including, but not limited to bleeding, infection, damage to adjacent structures or low yield requiring additional tests.  All of the questions were answered and there is agreement to proceed.  Consent signed and in chart.   Thank you for this interesting consult.  I greatly enjoyed meeting [employer] and look forward to participating in their care.  A copy of this report was sent to the requesting provider on this date.  Electronically Signed: Jacqualine Mau, NP 10/31/2021, 10:26 AM   I spent a total of  30 Minutes   in face to face in clinical consultation, greater than 50% of which was counseling/coordinating care for bone marrow biopsy

## 2021-10-31 NOTE — Procedures (Signed)
Interventional Radiology Procedure Note  Procedure: CT guided aspirate and core biopsy of right iliac bone Complications: None Recommendations: - Bedrest supine x 1 hrs - Hydrocodone PRN  Pain - Follow biopsy results  Signed,  Kanita Delage K. Liala Codispoti, MD   

## 2021-11-01 ENCOUNTER — Encounter: Payer: Self-pay | Admitting: Endocrinology

## 2021-11-01 ENCOUNTER — Ambulatory Visit (INDEPENDENT_AMBULATORY_CARE_PROVIDER_SITE_OTHER): Payer: Medicare Other | Admitting: Endocrinology

## 2021-11-01 VITALS — BP 170/62 | HR 60 | Ht 67.0 in | Wt 213.8 lb

## 2021-11-01 DIAGNOSIS — E119 Type 2 diabetes mellitus without complications: Secondary | ICD-10-CM

## 2021-11-01 DIAGNOSIS — Z794 Long term (current) use of insulin: Secondary | ICD-10-CM

## 2021-11-01 LAB — POCT GLYCOSYLATED HEMOGLOBIN (HGB A1C): Hemoglobin A1C: 7.2 % — AB (ref 4.0–5.6)

## 2021-11-01 NOTE — Progress Notes (Signed)
Subjective:    Patient ID: [employer], male    DOB: 1940-12-07, 81 y.o.   MRN: [id number]  HPI Pt returns for f/u of diabetes mellitus:  DM type: Insulin-requiring type 2 DM.   Dx'ed: 4034 Complications: PN, DR, CVA, PAD, CAD, and stage 4 CRI.   Therapy: insulin since 2005 DKA: never Severe hypoglycemia: last episode was in 2014.   Pancreatitis: never.  Pancreatic imaging: normal CT in 2006 SDOH: Pt says he cannot afford any more brand name meds.   Other: he eats 2 meals per day; he takes QD insulin, after poor results with multiple daily injections; fructosamine has confirmed A1c.   Interval history: pt says he takes meds as rx'ed. pt states he feels well in general.  He says cbg meter does not work.   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   CAD (coronary artery disease) 09/29/2019   Cervical spondylosis    Chronic anticoagulation    Chronic diastolic CHF (congestive heart failure) ([place]) 12/17/2018   CKD (chronic kidney disease) 12/17/2018   Diabetes mellitus    History of GI bleed 09/29/2019   Hyperlipidemia    Hypertension    Iron deficiency anemia due to chronic blood loss    Paroxysmal atrial fibrillation ([place]) 12/17/2018   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 NO ISCHEMIA   COLONOSCOPY W/ POLYPECTOMY     5 polyps   COLONOSCOPY WITH PROPOFOL N/A 08/29/2019   Procedure: COLONOSCOPY WITH PROPOFOL;  Surgeon: Doran Stabler, MD;  Location: [place];  Service: Gastroenterology;  Laterality: N/A;   CORONARY ARTERY  BYPASS GRAFT N/A 11/26/2018   Procedure: CORONARY ARTERY BYPASS GRAFTING (CABG), ON PUMP, TIMES FOUR, USING LEFT INTERNAL MAMMARY ARTERY AND ENDOSCOPICALLY HARVESTED LEFT SAPHENOUS VEIN;  Surgeon: Gaye Pollack, MD;  Location: [place];  Service: Open Heart Surgery;  Laterality: N/A;   ESOPHAGOGASTRODUODENOSCOPY (EGD) WITH PROPOFOL N/A 08/29/2019   Procedure: ESOPHAGOGASTRODUODENOSCOPY (EGD) WITH PROPOFOL;  Surgeon: Doran Stabler, MD;  Location: [place];  Service: Gastroenterology;  Laterality: N/A;   EUS N/A 03/11/2015   Procedure: LOWER ENDOSCOPIC ULTRASOUND (EUS);  Surgeon: Milus Banister, MD;  [facility];  Service: Endoscopy;  Laterality: N/A;   FLEXIBLE SIGMOIDOSCOPY N/A 02/02/2015   Procedure: FLEXIBLE SIGMOIDOSCOPY;  Surgeon: Inda Castle, MD;  [facility];  Service: Endoscopy;  Laterality: N/A;  ERBE   HEMOSTASIS CLIP PLACEMENT  08/29/2019   Procedure: HEMOSTASIS CLIP PLACEMENT;  Surgeon: Doran Stabler, MD;  Location: [name] ENDOSCOPY;  Service: Gastroenterology;;   HOT HEMOSTASIS N/A 08/29/2019   Procedure: HOT HEMOSTASIS (ARGON PLASMA COAGULATION/BICAP);  Surgeon: Doran Stabler, MD;  Location: [place];  Service: Gastroenterology;  Laterality: N/A;   LEFT HEART CATH AND CORONARY ANGIOGRAPHY N/A 11/20/2018   Procedure: LEFT HEART CATH AND CORONARY ANGIOGRAPHY;  Surgeon: Burnell Blanks, MD;  Location: [place] [facility];  Service: Cardiovascular;  Laterality: N/A;   LOOP RECORDER INSERTION N/A 08/14/2017   Procedure: LOOP  RECORDER INSERTION;  Surgeon: Thompson Grayer, MD;  Location: [place] [facility];  Service: Cardiovascular;  Laterality: N/A;   PARTIAL PROCTECTOMY BY TEM N/A 04/08/2015   Procedure: TEM PARTIAL PROCTECTOMY OF RECTAL MASS;  Surgeon: Michael Boston, MD;  [facility];  Service: General;  Laterality: N/A;   POLYPECTOMY  08/29/2019   Procedure: POLYPECTOMY;  Surgeon: Doran Stabler, MD;  Location[facility];  Service:  Gastroenterology;;   TEE WITHOUT CARDIOVERSION N/A 06/25/2017   Procedure: TRANSESOPHAGEAL ECHOCARDIOGRAM (TEE);  Surgeon: Skeet Latch, MD;  Location: [place];  Service: Cardiovascular;  Laterality: N/A;   TEE WITHOUT CARDIOVERSION N/A 11/26/2018   Procedure: TRANSESOPHAGEAL ECHOCARDIOGRAM (TEE);  Surgeon: Gaye Pollack, MD;  Location: [place];  Service: Open Heart Surgery;  Laterality: N/A;   TONSILLECTOMY AND ADENOIDECTOMY     as child    Social History   Socioeconomic History   Marital status: Divorced    Spouse name: Not on file   Number of children: 2   Years of education: Not on file   Highest education level: Not on file  Occupational History   Occupation: retired    Fish farm manager: [employer]  Tobacco Use   Smoking status: Former    Types: Cigarettes    Quit date: 08/13/1994    Years since quitting: 27.2   Smokeless tobacco: Never  Vaping Use   Vaping Use: Never used  Substance and Sexual Activity   Alcohol use: No    Alcohol/week: 0.0 standard drinks   Drug use: No   Sexual activity: Not Currently    Birth control/protection: None  Other Topics Concern   Not on file  Social History Narrative   Not on file   Social Determinants of Health   Financial Resource Strain: Not on file  Food Insecurity: Not on file  Transportation Needs: Not on file  Physical Activity: Not on file  Stress: Not on file  Social Connections: Not on file  Intimate Partner Violence: Not on file    Current Outpatient Medications on File Prior to Visit  Medication Sig Dispense Refill   amLODipine (NORVASC) 5 MG tablet Take 1 tablet (5 mg total) by mouth daily. Pt needs to keep upcoming appt in Feb, 2023 for further refills 90 tablet 1   apixaban (ELIQUIS) 5 MG TABS tablet Take 1 tablet (5 mg total) by mouth 2 (two) times daily. 60 tablet 5   fenofibrate micronized (LOFIBRA) 67 MG capsule TAKE 1 CAPSULE BY MOUTH DAILY BEFORE BREAKFAST. SCHEDULE PHYSICAL 90 capsule 0   hydrALAZINE  (APRESOLINE) 100 MG tablet Take 1 tablet (100 mg total) by mouth 3 (three) times daily. Patient must keep 11/21/2021 appointment for further refills 270 tablet 1   insulin glargine-yfgn (SEMGLEE, YFGN,) 100 UNIT/ML Pen Inject 60 Units into the skin every morning. 60 mL 3   isosorbide mononitrate (IMDUR) 60 MG 24 hr tablet Take 1 tablet (60 mg total) by mouth daily. Pt needs to keep upcoming appt in Feb, 2023 for further refills 90 tablet 1   Lancets (ONETOUCH ULTRASOFT) lancets Use as instructed 100 each 12   metoprolol tartrate (LOPRESSOR) 25 MG tablet TAKE 1 AND 1/2 TABLETS BY MOUTH TWICE A DAY NEEDS APPT FOR REFILLS 270 tablet 2   rosuvastatin (CRESTOR) 20 MG tablet TAKE 1 TABLET BY MOUTH AT BEDTIME. SCHEDULE PHYSICAL EXAM 30 tablet 0   tamsulosin (FLOMAX) 0.4 MG CAPS capsule TAKE 1 CAPSULE BY MOUTH EVERY DAY 90 capsule 1   torsemide (DEMADEX) 20 MG  tablet Take 1 tablet (20 mg total) by mouth 2 (two) times daily. Pt needs to keep upcoming appt in Feb, 2023 for further refills 180 tablet 1   No current facility-administered medications on file prior to visit.    No Known Allergies  Family History  Problem Relation Age of Onset   Cancer Father        "all over"   Coronary artery disease Brother    Diabetes Brother    Stroke Mother    Diabetes Mother    Cancer Brother    Colon cancer Neg Hx    Esophageal cancer Neg Hx    Rectal cancer Neg Hx    Stomach cancer Neg Hx     BP (!) 170/62 (BP Location: Left Arm, Patient Position: Sitting, Cuff Size: Normal)    Pulse 60    Ht 5\' 7"  (1.702 m)    Wt 213 lb 12.8 oz (97 kg)    SpO2 97%    BMI 33.49 kg/m    Review of Systems He denies hypoglycemia    Objective:   Physical Exam    Lab Results  Component Value Date   HGBA1C 7.2 (A) 11/01/2021      Assessment & Plan:  Insulin-requiring type 2 DM: this is the best control this pt should aim for, given this regimen, which does match insulin to his changing needs throughout the  day  Patient Instructions  Your blood pressure is high today.  Please see your primary care provider soon, to have it rechecked Please continue the same Lantus. check your blood sugar twice a day.  vary the time of day when you check, between before the 3 meals, and at bedtime.  also check if you have symptoms of your blood sugar being too high or too low.  please keep a record of the readings and bring it to your next appointment here (or you can bring the meter itself).  You can write it on any piece of paper.  please call us sooner if your blood sugar goes below 70, or if you have a lot of readings over 200.   Please have a follow-up appointment in 3 months. Here is a list of diabetic DME Suppliers:    [email or web address] [phone] Fax [phone]  [facility] [email or web address] [phone] Fax [phone]  [email or web address] [phone] Fax [phone]  [place].com [phone] Fax [phone]  [facility] [email or web address] [phone] Fax [phone]  [email or web address] [phone] Fax [phone]  [facility] [email or web address] [phone] Fax [phone]  [facility] [email or web address] [phone] Fax [email or web address] [phone] option #1 Fax [phone]  [facility] [email or web address] [phone] Fax [phone]  [place] [facility] [phone] Fax [phone]  [place] [facility] [email or web address] [phone] Fax [phone]

## 2021-11-01 NOTE — Patient Instructions (Addendum)
Your blood pressure is high today.  Please see your primary care provider soon, to have it rechecked Please continue the same Lantus. check your blood sugar twice a day.  vary the time of day when you check, between before the 3 meals, and at bedtime.  also check if you have symptoms of your blood sugar being too high or too low.  please keep a record of the readings and bring it to your next appointment here (or you can bring the meter itself).  You can write it on any piece of paper.  please call us sooner if your blood sugar goes below 70, or if you have a lot of readings over 200.   Please have a follow-up appointment in 3 months. Here is a list of diabetic DME Suppliers:    [email or web address] [phone] Fax [phone]  [facility] [email or web address] [phone] Fax [phone]  [email or web address] [phone] ext [phone] Fax [phone]  [email or web address] [phone] Fax [phone]  [facility] [email or web address] [phone] Fax [phone]  [email or web address] [phone] Fax [phone]  [facility] [email or web address] [phone] Fax [phone]  [facility] [email or web address] [phone] Fax [email or web address] [phone] option #1 Fax [phone]  [facility] [email or web address] [phone] Fax [phone]  [place] [facility] [phone] Fax [phone]  [place] [facility] [email or web address] [phone] Fax [phone]

## 2021-11-02 LAB — SURGICAL PATHOLOGY

## 2021-11-07 ENCOUNTER — Telehealth: Payer: Self-pay

## 2021-11-07 ENCOUNTER — Other Ambulatory Visit: Payer: Self-pay | Admitting: Physician Assistant

## 2021-11-07 DIAGNOSIS — R778 Other specified abnormalities of plasma proteins: Secondary | ICD-10-CM

## 2021-11-07 NOTE — Telephone Encounter (Signed)
Left message for patient to call back be advised of lab results, does not look like patient reviewed results on mychart  "Jake Gross,   I have reviewed your labs. Overall things are stable. Kidney function has decreased some. This is a delicate balance of you drinking enough fluid but not to much to cause you to have trouble breathing. The fluid test did show that you are holding on to some extra fluid. Cholesterol is stable. Let me know if you have any questions or concerns   Thanks, Romilda Garret, DNP, AGNP-C"

## 2021-11-08 ENCOUNTER — Other Ambulatory Visit: Payer: Self-pay

## 2021-11-08 ENCOUNTER — Other Ambulatory Visit: Payer: Self-pay | Admitting: Physician Assistant

## 2021-11-08 ENCOUNTER — Inpatient Hospital Stay: Payer: Medicare Other | Attending: Physician Assistant

## 2021-11-08 ENCOUNTER — Telehealth: Payer: Self-pay

## 2021-11-08 ENCOUNTER — Inpatient Hospital Stay (HOSPITAL_BASED_OUTPATIENT_CLINIC_OR_DEPARTMENT_OTHER): Payer: Medicare Other | Admitting: Internal Medicine

## 2021-11-08 ENCOUNTER — Encounter: Payer: Self-pay | Admitting: Internal Medicine

## 2021-11-08 VITALS — BP 156/71 | HR 53 | Temp 97.7°F | Resp 17 | Wt 215.5 lb

## 2021-11-08 DIAGNOSIS — N189 Chronic kidney disease, unspecified: Secondary | ICD-10-CM | POA: Diagnosis not present

## 2021-11-08 DIAGNOSIS — R778 Other specified abnormalities of plasma proteins: Secondary | ICD-10-CM

## 2021-11-08 DIAGNOSIS — D472 Monoclonal gammopathy: Secondary | ICD-10-CM | POA: Diagnosis not present

## 2021-11-08 DIAGNOSIS — Z79899 Other long term (current) drug therapy: Secondary | ICD-10-CM | POA: Diagnosis not present

## 2021-11-08 DIAGNOSIS — E876 Hypokalemia: Secondary | ICD-10-CM

## 2021-11-08 DIAGNOSIS — I2510 Atherosclerotic heart disease of native coronary artery without angina pectoris: Secondary | ICD-10-CM | POA: Insufficient documentation

## 2021-11-08 DIAGNOSIS — R779 Abnormality of plasma protein, unspecified: Secondary | ICD-10-CM | POA: Diagnosis not present

## 2021-11-08 DIAGNOSIS — E785 Hyperlipidemia, unspecified: Secondary | ICD-10-CM | POA: Insufficient documentation

## 2021-11-08 DIAGNOSIS — R5383 Other fatigue: Secondary | ICD-10-CM | POA: Insufficient documentation

## 2021-11-08 DIAGNOSIS — D649 Anemia, unspecified: Secondary | ICD-10-CM | POA: Diagnosis not present

## 2021-11-08 LAB — CBC WITH DIFFERENTIAL (CANCER CENTER ONLY)
Abs Immature Granulocytes: 0.04 10*3/uL (ref 0.00–0.07)
Basophils Absolute: 0.1 10*3/uL (ref 0.0–0.1)
Basophils Relative: 1 %
Eosinophils Absolute: 0.3 10*3/uL (ref 0.0–0.5)
Eosinophils Relative: 3 %
HCT: 35 % — ABNORMAL LOW (ref 39.0–52.0)
Hemoglobin: 11.1 g/dL — ABNORMAL LOW (ref 13.0–17.0)
Immature Granulocytes: 0 %
Lymphocytes Relative: 19 %
Lymphs Abs: 1.8 10*3/uL (ref 0.7–4.0)
MCH: 26.4 pg (ref 26.0–34.0)
MCHC: 31.7 g/dL (ref 30.0–36.0)
MCV: 83.1 fL (ref 80.0–100.0)
Monocytes Absolute: 0.8 10*3/uL (ref 0.1–1.0)
Monocytes Relative: 9 %
Neutro Abs: 6.4 10*3/uL (ref 1.7–7.7)
Neutrophils Relative %: 68 %
Platelet Count: 228 10*3/uL (ref 150–400)
RBC: 4.21 MIL/uL — ABNORMAL LOW (ref 4.22–5.81)
RDW: 15.7 % — ABNORMAL HIGH (ref 11.5–15.5)
WBC Count: 9.4 10*3/uL (ref 4.0–10.5)
nRBC: 0 % (ref 0.0–0.2)

## 2021-11-08 LAB — CMP (CANCER CENTER ONLY)
ALT: 13 U/L (ref 0–44)
AST: 17 U/L (ref 15–41)
Albumin: 3.6 g/dL (ref 3.5–5.0)
Alkaline Phosphatase: 83 U/L (ref 38–126)
Anion gap: 8 (ref 5–15)
BUN: 52 mg/dL — ABNORMAL HIGH (ref 8–23)
CO2: 23 mmol/L (ref 22–32)
Calcium: 9.1 mg/dL (ref 8.9–10.3)
Chloride: 111 mmol/L (ref 98–111)
Creatinine: 2.18 mg/dL — ABNORMAL HIGH (ref 0.61–1.24)
GFR, Estimated: 30 mL/min — ABNORMAL LOW (ref >60)
Glucose, Bld: 159 mg/dL — ABNORMAL HIGH (ref 70–99)
Potassium: 5.7 mmol/L — ABNORMAL HIGH (ref 3.5–5.1)
Sodium: 142 mmol/L (ref 135–145)
Total Bilirubin: 0.4 mg/dL (ref 0.3–1.2)
Total Protein: 7.3 g/dL (ref 6.5–8.1)

## 2021-11-08 LAB — LACTATE DEHYDROGENASE: LDH: 169 U/L (ref 98–192)

## 2021-11-08 NOTE — Telephone Encounter (Signed)
**Note De-Identified  Obfuscation** No answer so I left a message on the pts VM asking him to call Jeani Hawking at Dr Camillia Herter office at [facility] at [phone].

## 2021-11-08 NOTE — Progress Notes (Signed)
[place] Telephone:([phone]   [phone]  OFFICE PROGRESS NOTE  Michela Pitcher, NP [place] [address]  DIAGNOSIS: Monoclonal gammopathy of undetermined significance (MGUS)  PRIOR THERAPY: None  CURRENT THERAPY: Observation  INTERVAL HISTORY: Jake Gross 81 y.o. male returns to the clinic today for follow-up visit accompanied by his daughter.  The patient is feeling fine today with no concerning complaints.  He denied having any recent weight loss or night sweats.  He has no nausea, vomiting, diarrhea or constipation.  He has no headache or visual changes.  He denied having any significant fever or chills.  He has no chest pain, shortness of breath, cough or hemoptysis.  He underwent a bone marrow biopsy and aspirate recently and he is here for evaluation and discussion of his biopsy results or recommendations regarding his condition.  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  MEDICATIONS:  Current Outpatient Medications  Medication Sig Dispense Refill   amLODipine (NORVASC) 5 MG tablet Take 1 tablet (5 mg total) by mouth daily. Pt  needs to keep upcoming appt in Feb, 2023 for further refills 90 tablet 1   apixaban (ELIQUIS) 5 MG TABS tablet Take 1 tablet (5 mg total) by mouth 2 (two) times daily. 60 tablet 5   hydrALAZINE (APRESOLINE) 100 MG tablet Take 1 tablet (100 mg total) by mouth 3 (three) times daily. Patient must keep 11/21/2021 appointment for further refills 270 tablet 1   insulin glargine-yfgn (SEMGLEE, YFGN,) 100 UNIT/ML Pen Inject 60 Units into the skin every morning. 60 mL 3   isosorbide mononitrate (IMDUR) 60 MG 24 hr tablet Take 1 tablet (60 mg total) by mouth daily. Pt needs to keep upcoming appt in Feb, 2023 for further refills 90 tablet 1   Lancets (ONETOUCH ULTRASOFT) lancets Use as instructed 100 each 12   metoprolol tartrate (LOPRESSOR) 25 MG tablet TAKE 1 AND 1/2 TABLETS BY MOUTH TWICE A DAY NEEDS APPT FOR REFILLS 270 tablet 2   rosuvastatin (CRESTOR) 20 MG tablet TAKE 1 TABLET BY MOUTH AT BEDTIME. SCHEDULE PHYSICAL EXAM 30 tablet 0   tamsulosin (FLOMAX) 0.4 MG CAPS capsule TAKE 1 CAPSULE BY MOUTH EVERY DAY 90 capsule 1   torsemide (DEMADEX) 20 MG tablet Take 1 tablet (20 mg total) by mouth 2 (two) times daily. Pt needs to keep upcoming appt in Feb, 2023 for further refills 180 tablet 1   No current facility-administered medications for this visit.    SURGICAL HISTORY:  Past Surgical History:  Procedure Laterality Date   ABCESS DRAINAGE Left    buttocks  CARDIOVASCULAR STRESS TEST  10/12/1999   EF 63%. NO ISCHEMIA   COLONOSCOPY W/ POLYPECTOMY     5 polyps   COLONOSCOPY WITH PROPOFOL N/A 08/29/2019   Procedure: COLONOSCOPY WITH PROPOFOL;  Surgeon: Doran Stabler, MD;  Location: [place];  Service: Gastroenterology;  Laterality: N/A;   CORONARY ARTERY BYPASS GRAFT N/A 11/26/2018   Procedure: CORONARY ARTERY BYPASS GRAFTING (CABG), ON PUMP, TIMES FOUR, USING LEFT INTERNAL MAMMARY ARTERY AND ENDOSCOPICALLY HARVESTED LEFT SAPHENOUS VEIN;  Surgeon: Gaye Pollack, MD;  Location: [place];   Service: Open Heart Surgery;  Laterality: N/A;   ESOPHAGOGASTRODUODENOSCOPY (EGD) WITH PROPOFOL N/A 08/29/2019   Procedure: ESOPHAGOGASTRODUODENOSCOPY (EGD) WITH PROPOFOL;  Surgeon: Doran Stabler, MD;  Location: [place];  Service: Gastroenterology;  Laterality: N/A;   EUS N/A 03/11/2015   Procedure: LOWER ENDOSCOPIC ULTRASOUND (EUS);  Surgeon: Milus Banister, MD;  Location: [facility];  Service: Endoscopy;  Laterality: N/A;   FLEXIBLE SIGMOIDOSCOPY N/A 02/02/2015   Procedure: FLEXIBLE SIGMOIDOSCOPY;  Surgeon: Inda Castle, MD;  Location: [facility];  Service: Endoscopy;  Laterality: N/A;  ERBE   HEMOSTASIS CLIP PLACEMENT  08/29/2019   Procedure: HEMOSTASIS CLIP PLACEMENT;  Surgeon: Doran Stabler, MD;  Location: [place] [facility];  Service: Gastroenterology;;   HOT HEMOSTASIS N/A 08/29/2019   Procedure: HOT HEMOSTASIS (ARGON PLASMA COAGULATION/BICAP);  Surgeon: Doran Stabler, MD;  Location: [place];  Service: Gastroenterology;  Laterality: N/A;   LEFT HEART CATH AND CORONARY ANGIOGRAPHY N/A 11/20/2018   Procedure: LEFT HEART CATH AND CORONARY ANGIOGRAPHY;  Surgeon: Burnell Blanks, MD;  Location: [place] [facility];  Service: Cardiovascular;  Laterality: N/A;   LOOP RECORDER INSERTION N/A 08/14/2017   Procedure: LOOP RECORDER INSERTION;  Surgeon: Thompson Grayer, MD;  Location: [place] [facility];  Service: Cardiovascular;  Laterality: N/A;   PARTIAL PROCTECTOMY BY TEM N/A 04/08/2015   Procedure: TEM PARTIAL PROCTECTOMY OF RECTAL MASS;  Surgeon: Michael Boston, MD;  Location: [facility];  Service: General;  Laterality: N/A;   POLYPECTOMY  08/29/2019   Procedure: POLYPECTOMY;  Surgeon: Doran Stabler, MD;  Location: [facility];  Service: Gastroenterology;;   TEE WITHOUT CARDIOVERSION N/A 06/25/2017   Procedure: TRANSESOPHAGEAL ECHOCARDIOGRAM (TEE);  Surgeon: Skeet Latch, MD;  Location: [place];  Service: Cardiovascular;  Laterality: N/A;   TEE WITHOUT  CARDIOVERSION N/A 11/26/2018   Procedure: TRANSESOPHAGEAL ECHOCARDIOGRAM (TEE);  Surgeon: Gaye Pollack, MD;  Location: [place];  Service: Open Heart Surgery;  Laterality: N/A;   TONSILLECTOMY AND ADENOIDECTOMY     as child    REVIEW OF SYSTEMS:  A comprehensive review of systems was negative except for: Constitutional: positive for fatigue   PHYSICAL EXAMINATION: General appearance: alert, cooperative, fatigued, and no distress Head: Normocephalic, without obvious abnormality, atraumatic Neck: no adenopathy, no JVD, supple, symmetrical, trachea midline, and thyroid not enlarged, symmetric, no tenderness/mass/nodules Lymph nodes: Cervical, supraclavicular, and axillary nodes normal. Resp: clear to auscultation bilaterally Back: symmetric, no curvature. ROM normal. No CVA tenderness. Cardio: regular rate and rhythm, S1, S2 normal, no murmur, click, rub or gallop GI: soft, non-tender; bowel sounds normal; no masses,  no organomegaly Extremities: extremities normal, atraumatic, no cyanosis or edema  ECOG PERFORMANCE STATUS: 1 - Symptomatic but completely ambulatory  Blood pressure (!) 156/71, pulse (!) 53, temperature 97.7 F (36.5 C), temperature source Oral, resp. rate 17, weight 215 lb 8 oz (97.8 kg), SpO2 96 %.  LABORATORY DATA: Lab Results  Component Value Date  WBC 9.4 11/08/2021   HGB 11.1 (L) 11/08/2021   HCT 35.0 (L) 11/08/2021   MCV 83.1 11/08/2021   PLT 228 11/08/2021      Chemistry      Component Value Date/Time   NA 143 10/28/2021 1510   NA 141 07/20/2021 1552   K 5.3 10/28/2021 1510   CL 110 10/28/2021 1510   CO2 23 10/28/2021 1510   BUN 61 (H) 10/28/2021 1510   BUN 58 (H) 07/20/2021 1552   CREATININE 2.33 (H) 10/28/2021 1510   GLU 207 01/19/2016 0000      Component Value Date/Time   CALCIUM 9.2 10/28/2021 1510   ALKPHOS 87 09/12/2021 1301   AST 19 10/28/2021 1510   ALT 14 10/28/2021 1510   BILITOT 0.5 10/28/2021 1510       RADIOGRAPHIC STUDIES: CT  Biopsy  Result Date: 10/31/2021 INDICATION: 81 year old male with abnormal myeloma workup. He presents for CT-guided bone marrow biopsy. EXAM: CT GUIDED BONE MARROW ASPIRATION AND CORE BIOPSY Interventional Radiologist:  Criselda Peaches, MD MEDICATIONS: None. ANESTHESIA/SEDATION: Moderate (conscious) sedation was employed during this procedure. A total of 2 milligrams versed and 100 micrograms fentanyl were administered intravenously. The patient's level of consciousness and vital signs were monitored continuously by radiology nursing throughout the procedure under my direct supervision. Total monitored sedation time: 10 minutes FLUOROSCOPY TIME:  None. COMPLICATIONS: None immediate. Estimated blood loss: <25 mL PROCEDURE: Informed written consent was obtained from the patient after a thorough discussion of the procedural risks, benefits and alternatives. All questions were addressed. Maximal Sterile Barrier Technique was utilized including caps, mask, sterile gowns, sterile gloves, sterile drape, hand hygiene and skin antiseptic. A timeout was performed prior to the initiation of the procedure. The patient was positioned prone and non-contrast localization CT was performed of the pelvis to demonstrate the iliac marrow spaces. Maximal barrier sterile technique utilized including caps, mask, sterile gowns, sterile gloves, large sterile drape, hand hygiene, and betadine prep. Under sterile conditions and local anesthesia, an 11 gauge coaxial bone biopsy needle was advanced into the right iliac marrow space. Needle position was confirmed with CT imaging. Initially, bone marrow aspiration was performed. Next, the 11 gauge outer cannula was utilized to obtain a right iliac bone marrow core biopsy. Needle was removed. Hemostasis was obtained with compression. The patient tolerated the procedure well. Samples were prepared with the cytotechnologist. IMPRESSION: Technically successful CT-guided bone marrow aspiration  and core biopsy. Electronically Signed   By: Jacqulynn Cadet M.D.   On: 10/31/2021 13:24   CT BONE MARROW BIOPSY & ASPIRATION  Result Date: 10/31/2021 INDICATION: 81 year old male with abnormal myeloma workup. He presents for CT-guided bone marrow biopsy. EXAM: CT GUIDED BONE MARROW ASPIRATION AND CORE BIOPSY Interventional Radiologist:  Criselda Peaches, MD MEDICATIONS: None. ANESTHESIA/SEDATION: Moderate (conscious) sedation was employed during this procedure. A total of 2 milligrams versed and 100 micrograms fentanyl were administered intravenously. The patient's level of consciousness and vital signs were monitored continuously by radiology nursing throughout the procedure under my direct supervision. Total monitored sedation time: 10 minutes FLUOROSCOPY TIME:  None. COMPLICATIONS: None immediate. Estimated blood loss: <25 mL PROCEDURE: Informed written consent was obtained from the patient after a thorough discussion of the procedural risks, benefits and alternatives. All questions were addressed. Maximal Sterile Barrier Technique was utilized including caps, mask, sterile gowns, sterile gloves, sterile drape, hand hygiene and skin antiseptic. A timeout was performed prior to the initiation of the procedure. The patient was positioned prone and non-contrast  localization CT was performed of the pelvis to demonstrate the iliac marrow spaces. Maximal barrier sterile technique utilized including caps, mask, sterile gowns, sterile gloves, large sterile drape, hand hygiene, and betadine prep. Under sterile conditions and local anesthesia, an 11 gauge coaxial bone biopsy needle was advanced into the right iliac marrow space. Needle position was confirmed with CT imaging. Initially, bone marrow aspiration was performed. Next, the 11 gauge outer cannula was utilized to obtain a right iliac bone marrow core biopsy. Needle was removed. Hemostasis was obtained with compression. The patient tolerated the procedure  well. Samples were prepared with the cytotechnologist. IMPRESSION: Technically successful CT-guided bone marrow aspiration and core biopsy. Electronically Signed   By: Jacqulynn Cadet M.D.   On: 10/31/2021 13:24    ASSESSMENT AND PLAN: This is a very pleasant 81 years old white male recently diagnosed with monoclonal gammopathy of undetermined significance.  The patient had a bone marrow biopsy and aspirate performed recently.  I discussed the biopsy results with the patient and his daughter.  His biopsy showed presence of 4% of the plasma cells with lack of large aggregates or sheets in the clots and biopsy section.  The plasma cells display polyclonal staining pattern for kappa and lambda light chains and no diagnostic definitive evidence of plasma cell neoplasm and no evidence of lymphoproliferative disorder. I recommended for the patient to continue on observation with close monitoring.  I will see him back for follow-up visit in 6 months for evaluation with repeat myeloma panel. He was advised to call immediately if he has any other concerning symptoms in the interval. The patient voices understanding of current disease status and treatment options and is in agreement with the current care plan.  All questions were answered. The patient knows to call the clinic with any problems, questions or concerns. We can certainly see the patient much sooner if necessary.  The total time spent in the appointment was 20 minutes.  Disclaimer: This note was dictated with voice recognition software. Similar sounding words can inadvertently be transcribed and may not be corrected upon review.

## 2021-11-08 NOTE — Telephone Encounter (Signed)
**Note De-identified  Obfuscation** -----  **Note De-Identified  Obfuscation** Message from Rodman Key, RN sent at 11/07/2021  1:57 PM EST ----- Regarding: FW: Eliquis Wyonia Hough, I wonder if Mr. Fatzinger Eliquis is high due to a deductible since its January.  Would you be able to give him a call and see if we can help get his cost down?  Thank you, Michalene ----- Message ----- From: Burnell Blanks, MD Sent: 10/29/2021   3:03 PM EST To: Rodman Key, RN, Michela Pitcher, NP Subject: RE: Eliquis                                    We will speak to him. Thanks  Michalene, We can see if he would like to change to coumadin. He can also look and see if Xarelto would be cheaper on his medication plan.   Gerald Stabs  ----- Message ----- From: Michela Pitcher, NP Sent: 10/29/2021   8:47 AM EST To: Burnell Blanks, MD Subject: Eliquis                                        Dr. Angelena Form,  I saw Burnie in office and he was stating that the Eliquis is really expensive and was asking about other options. I was wondering if your staff could reach out to the patient. Thanks for your help  Romilda Garret, NP-C

## 2021-11-08 NOTE — Telephone Encounter (Signed)
**Note De-identified  Obfuscation** -----  **Note De-Identified  Obfuscation** Message from Rodman Key, RN sent at 11/07/2021  1:57 PM EST ----- Regarding: FW: Eliquis Wyonia Hough, I wonder if Mr. Kerner Eliquis is high due to a deductible since its January.  Would you be able to give him a call and see if we can help get his cost down?  Thank you, Michalene ----- Message ----- From: Burnell Blanks, MD Sent: 10/29/2021   3:03 PM EST To: Rodman Key, RN, Michela Pitcher, NP Subject: RE: Eliquis                                    We will speak to him. Thanks  Michalene, We can see if he would like to change to coumadin. He can also look and see if Xarelto would be cheaper on his medication plan.   Gerald Stabs  ----- Message ----- From: Michela Pitcher, NP Sent: 10/29/2021   8:47 AM EST To: Burnell Blanks, MD Subject: Eliquis                                        Dr. Angelena Form,  I saw Burnie in office and he was stating that the Eliquis is really expensive and was asking about other options. I was wondering if your staff could reach out to the patient. Thanks for your help  Romilda Garret, NP-C

## 2021-11-09 ENCOUNTER — Encounter (HOSPITAL_COMMUNITY): Payer: Self-pay | Admitting: Internal Medicine

## 2021-11-09 ENCOUNTER — Telehealth: Payer: Self-pay | Admitting: Physician Assistant

## 2021-11-09 ENCOUNTER — Inpatient Hospital Stay: Payer: Medicare Other

## 2021-11-09 DIAGNOSIS — I2510 Atherosclerotic heart disease of native coronary artery without angina pectoris: Secondary | ICD-10-CM | POA: Diagnosis not present

## 2021-11-09 DIAGNOSIS — R779 Abnormality of plasma protein, unspecified: Secondary | ICD-10-CM | POA: Diagnosis not present

## 2021-11-09 DIAGNOSIS — N189 Chronic kidney disease, unspecified: Secondary | ICD-10-CM | POA: Diagnosis not present

## 2021-11-09 DIAGNOSIS — D472 Monoclonal gammopathy: Secondary | ICD-10-CM | POA: Diagnosis not present

## 2021-11-09 DIAGNOSIS — D649 Anemia, unspecified: Secondary | ICD-10-CM | POA: Diagnosis not present

## 2021-11-09 DIAGNOSIS — E876 Hypokalemia: Secondary | ICD-10-CM

## 2021-11-09 DIAGNOSIS — E785 Hyperlipidemia, unspecified: Secondary | ICD-10-CM | POA: Diagnosis not present

## 2021-11-09 LAB — CMP (CANCER CENTER ONLY)
ALT: 15 U/L (ref 0–44)
AST: 19 U/L (ref 15–41)
Albumin: 3.5 g/dL (ref 3.5–5.0)
Alkaline Phosphatase: 86 U/L (ref 38–126)
Anion gap: 8 (ref 5–15)
BUN: 48 mg/dL — ABNORMAL HIGH (ref 8–23)
CO2: 22 mmol/L (ref 22–32)
Calcium: 8.7 mg/dL — ABNORMAL LOW (ref 8.9–10.3)
Chloride: 111 mmol/L (ref 98–111)
Creatinine: 2.05 mg/dL — ABNORMAL HIGH (ref 0.61–1.24)
GFR, Estimated: 33 mL/min — ABNORMAL LOW (ref >60)
Glucose, Bld: 88 mg/dL (ref 70–99)
Potassium: 5 mmol/L (ref 3.5–5.1)
Sodium: 141 mmol/L (ref 135–145)
Total Bilirubin: 0.6 mg/dL (ref 0.3–1.2)
Total Protein: 7 g/dL (ref 6.5–8.1)

## 2021-11-09 NOTE — Telephone Encounter (Signed)
Scheduled appointment per 01/24 sch msg. Patient is aware.

## 2021-11-09 NOTE — Telephone Encounter (Signed)
The patient had elevated potassium yesterday.  He was brought back to the clinic today for a recheck.  His potassium is improved at 5.0.  I called the patient to share this information but was unable to reach him.  I left a voicemail letting him know that his electrolytes were improved today and that no further intervention is needed.  Would continue to advise excessive amounts of potassium rich food intake or any supplements that he may have been taking.  If he has any further questions I left our callback number.

## 2021-11-09 NOTE — Telephone Encounter (Signed)
Left message for patient to call back. Went ahead and sent letter with results to the patient

## 2021-11-11 DIAGNOSIS — Z20822 Contact with and (suspected) exposure to covid-19: Secondary | ICD-10-CM | POA: Diagnosis not present

## 2021-11-14 ENCOUNTER — Other Ambulatory Visit: Payer: Self-pay | Admitting: *Deleted

## 2021-11-15 NOTE — Patient Outreach (Signed)
[place] [facility]) Care Management [facility] Note   11/15/2021 Name:  Jake Gross MRN:  [id number] DOB:  1941/10/03  Summary: Patient blood pressure is elevated .  Home blood pressures are at home are better.  He is very fatigue with any exertion and when sitting. Patient stated he is only able to walk to the mailbox and  back in the house. He becomes very short of breath.   He has not been exercising. His appetite is good. He has not had any recent falls.   Recommendations/Changes made from today's visit: Follow up with [facility] to start exercising routine Monitor diet   Subjective: Jake Gross is an 81 y.o. year old male who is a primary patient of Michela Pitcher, NP. The care management team was consulted for assistance with care management and/or care coordination needs.    RN Health Coach completed Telephone Visit today.   Objective:  Medications Reviewed Today     Reviewed by Ardeen Garland, RN (Registered Nurse) on 11/08/21 at 1521  Med List Status: <None>   Medication Order Taking? Sig Documenting Provider Last Dose Status Informant  amLODipine (NORVASC) 5 MG tablet [id number] Yes Take 1 tablet (5 mg total) by mouth daily. Pt needs to keep upcoming appt in Feb, 2023 for further refills Burnell Blanks, MD Taking Active   apixaban (ELIQUIS) 5 MG TABS tablet [id number] Yes Take 1 tablet (5 mg total) by mouth 2 (two) times daily. Burnell Blanks, MD Taking Active   hydrALAZINE (APRESOLINE) 100 MG tablet [id number] Yes Take 1 tablet (100 mg total) by mouth 3 (three) times daily. Patient must keep 11/21/2021 appointment for further refills Imogene Burn, PA-C Taking Active   insulin glargine-yfgn (SEMGLEE, YFGN,) 100 UNIT/ML Pen [id number] Yes Inject 60 Units into the skin every morning. Renato Shin, MD Taking Active   isosorbide mononitrate (IMDUR) 60 MG 24 hr tablet [id number] Yes Take 1 tablet (60 mg total) by mouth daily. Pt needs to  keep upcoming appt in Feb, 2023 for further refills Burnell Blanks, MD Taking Active   Lancets [facility] ULTRASOFT) lancets [id number] Yes Use as instructed Renato Shin, MD Taking Active   metoprolol tartrate (LOPRESSOR) 25 MG tablet [id number] Yes TAKE 1 AND 1/2 TABLETS BY MOUTH TWICE A DAY NEEDS APPT FOR REFILLS Allred, Jeneen Rinks, MD Taking Active   rosuvastatin (CRESTOR) 20 MG tablet [id number] Yes TAKE 1 TABLET BY MOUTH AT BEDTIME. SCHEDULE PHYSICAL Knute Neu, Abby Potash B, FNP Taking Active   tamsulosin (FLOMAX) 0.4 MG CAPS capsule [id number] Yes TAKE 1 CAPSULE BY MOUTH EVERY DAY Baity, Coralie Keens, NP Taking Active   torsemide (DEMADEX) 20 MG tablet [id number] Yes Take 1 tablet (20 mg total) by mouth 2 (two) times daily. Pt needs to keep upcoming appt in Feb, 2023 for further refills Burnell Blanks, MD Taking Active              SDOH:  (Social Determinants of Health) assessments and interventions performed:  SDOH Interventions    Flowsheet Row Most Recent Value  SDOH Interventions   Food Insecurity Interventions Intervention Not Indicated  Housing Interventions Intervention Not Indicated  Transportation Interventions Intervention Not Indicated       Care Plan  Review of patient past medical history, allergies, medications, health status, including review of consultants reports, laboratory and other test data, was performed as part of comprehensive evaluation for care management services.   Care Plan : Hypertension (Adult)  Updates made  by Verlin Grills, RN since 11/15/2021 12:00 AM     Problem: Hypertension (Hypertension) Resolved 11/15/2021  Priority: High  Onset Date: 09/22/2020  Note:   [id number] Resolving due to duplicate goal     Goal: Hypertension Monitored Completed 11/15/2021  Start Date: 04/26/2020  Expected End Date: 11/14/2021  Recent Progress: On track  Priority: High  Note:   Evidence-based guidance:  Promote initial use of ambulatory blood  pressure measurements (for 3 days) to rule out "white-coat" effect; identify masked hypertension and presence or absence of nocturnal "dipping" of blood pressure.   Encourage continued use of home blood pressure monitoring and recording in blood pressure log; include symptoms of hypotension or potential medication side effects in log.  Review blood pressure measurements taken inside and outside of the provider office; establish baseline and monitor trends; compare to target ranges or patient goal.  Share overall cardiovascular risk with patient; encourage changes to lifestyle risk factors, including alcohol consumption, smoking, inadequate exercise, poor dietary habits and stress.   Notes:  [id number] Patient is not monitoring BP at home [id number] Patient is not monitoring blood pressure at home    Task: Identify and Monitor Blood Pressure Elevation Completed 11/15/2021  Due Date: 11/14/2021  Note:   Care Management Activities:    - blood pressure equipment and technique reviewed - home or ambulatory blood pressure monitoring encouraged    Notes:  [id number] Per patient he has been going to the Dr frequently and his blood pressure was being monitored    Problem: Disease Progression (Hypertension) Resolved 11/15/2021  Priority: Medium  Note:   [id number] Resolving due to duplicate goal     Long-Range Goal: Disease Progression Prevented or Minimized Completed 11/15/2021  Start Date: 09/22/2020  Expected End Date: 10/14/2021  Recent Progress: On track  Priority: Medium  Note:   Evidence-based guidance:  Tailor lifestyle advice to individual; review progress regularly; give frequent encouragement and respond positively to incremental successes.  Assess for and promote awareness of worsening disease or development of comorbidity.  Prepare patient for laboratory and diagnostic exams based on risk and presentation.  Prepare patient for use of pharmacologic therapy that may include diuretic,  beta-blocker, beta-blocker/thiazide combination, angiotensin-converting enzyme inhibitor, renin-angiotensin blocker or calcium-channel blocker.  Expect periodic adjustments to pharmacologic therapy; manage side effects.  Promote a healthy diet that includes primarily plant-based foods, such as fruits, vegetables, whole grains, beans and legumes, low-fat dairy and lean meats.   Consider moderate reduction in sodium intake by avoiding the addition of salt to prepared foods and limiting processed meats, canned soup, frozen meals and salty snacks.   Promote a regular, daily exercise goal of 150 minutes per week of moderate exercise based on tolerance, ability and patient choice; consider referral to physical therapist, community wellness and/or activity program.  Encourage the avoidance of no more than 2 hours per day of sedentary activity, such as recreational screen time.  Review sources of stress; explore current coping strategies and encourage use of mindfulness, yoga, meditation or exercise to manage stress.   Notes:     Task: Alleviate Barriers to Hypertension Treatment Completed 11/15/2021  Due Date: 10/14/2021  Note:   Care Management Activities:    - healthy diet promoted - medication side effects managed - quality of sleep assessed - reduction of dietary sodium encouraged - reduction in sedentary activities encouraged    Notes:     Problem: Resistant Hypertension (Hypertension) Resolved 11/15/2021  Priority: Medium  Note:   [id number] Resolving due  to duplicate goal     Long-Range Goal: Response to Treatment Maximized Completed 11/15/2021  Start Date: 09/22/2020  Expected End Date: 10/14/2021  Priority: Medium  Note:   Evidence-based guidance:  Assess patient response to treatment, including presence or absence of medication side effects, degree of blood pressure control and patient satisfaction.  Assess technique (including cuff size and placement), measurement times, condition  and calibration of blood pressure cuff set (both at-home and in-office equipment).  Assess factors that may influence response to treatment, including nonadherence to pharmacologic treatment plan, diet or activity changes and/or presence of pain, stress or sleep disturbance.  Screen for signs and symptoms of depression; if present, refer for or complete a comprehensive assessment.  Evaluate social and economic barriers that may affect adherence to treatment plan  Address pharmacologic nonadherence by simplifying dosing regimen, counseling or support by pharmacist, financial assistance, self-monitoring of blood pressure, use of motivational interviewing, voice or text messages.  Encourage behavioral adherence strategies, like habit-based interventions that link medication taking with existing daily routines.  Assess barriers to regular, daily physical activity; support family or support person-oriented activity changes and utilization of community activity or sports program.  Address barriers to dietary changes, especially sodium restriction, with referrals to community programs, like cooking classes, meal services or intensive education when available.  Refer to community-based peer support program or nurse home-visiting program.  Assess for chronic pain; when present add additional goals (Chronic Pain Care Plan Guide) as needed.  Provide frequent follow-up by telephone, telemonitoring, patient-practice portal or with home visit.  Review alcohol use screen; address using brief intervention beginning with risk that interferes with blood pressure control; refer for treatment when excessive alcohol use is noted.  Screen for obstructive sleep apnea; prepare patient for polysomnography based on risk and presentation and use of noninvasive ventilation to relieve obstructive sleep apnea when present.   Notes:     Task: Facilitate Adherence to Lifestyle Change Completed 11/15/2021  Due Date: 10/14/2021   Note:   Care Management Activities:    - home blood pressure monitoring technique reviewed - medication adherence assessment completed - resources needed to manage barriers to lifestyle change identified - success praised - support and encouragement provided    Notes:     Care Plan : [place] of Care  Updates made by Iwao Shamblin, Eppie Gibson, RN since 11/15/2021 12:00 AM     Problem: Knowledge Deficit Related to Hypertension and Care Coordination Needs   Priority: High     Long-Range Goal: Development Plan of Care for Management of Hypertension   Start Date: 11/15/2021  Expected End Date: 10/14/2022  Priority: High  Note:   Current Barriers:  Knowledge Deficits related to plan of care for management of HTN   RNCM Clinical Goal(s):  Patient will verbalize understanding of plan for management of HTN as evidenced by continuation of monitoring blood pressure and adhering to 2300 mg diet  through collaboration with RN Care manager, provider, and care team.   Interventions: Inter-disciplinary care team collaboration (see longitudinal plan of care) Evaluation of current treatment plan related to  self management and patient's adherence to plan as established by provider  Patient Goals/Self-Care Activities: Take medications as prescribed   Attend all scheduled provider appointments Call pharmacy for medication refills 3-7 days in advance of running out of medications Perform all self care activities independently  Perform IADL's (shopping, preparing meals, housekeeping, managing finances) independently Call provider office for new concerns or questions  call  the Suicide and Crisis Lifeline: 988 if experiencing a Mental Health or [place]  check blood pressure 3 times per week choose a place to take my blood pressure (home, clinic or office, retail store) write blood pressure results in a log or diary take blood pressure log to all doctor appointments call  doctor for signs and symptoms of high blood pressure develop an action plan for high blood pressure keep all doctor appointments take medications for blood pressure exactly as prescribed begin an exercise program report new symptoms to your doctor limit salt intake to 2300 mg/day       Plan: Telephone follow up appointment with care management team member scheduled for:  February 2023 The patient has been provided with contact information for the care management team and has been advised to call with any health related questions or concerns.   [place] Care Management [phone]

## 2021-11-15 NOTE — Patient Instructions (Signed)
Visit Information  Thank you for taking time to visit with me today. Please don't hesitate to contact me if I can be of assistance to you before our next scheduled telephone appointment.  Following are the goals we discussed today:  Current Barriers:  Knowledge Deficits related to plan of care for management of HTN   RNCM Clinical Goal(s):  Patient will verbalize understanding of plan for management of HTN as evidenced by continuation of monitoring blood pressure and adhering to 2300 mg diet through collaboration with RN Care manager, provider, and care team.   Interventions: Inter-disciplinary care team collaboration (see longitudinal plan of care) Evaluation of current treatment plan related to  self management and patient's adherence to plan as established by provider  Patient Goals/Self-Care Activities: Take medications as prescribed   Attend all scheduled provider appointments Call pharmacy for medication refills 3-7 days in advance of running out of medications Perform all self care activities independently  Perform IADL's (shopping, preparing meals, housekeeping, managing finances) independently Call provider office for new concerns or questions  call the Suicide and Crisis Lifeline: 988 if experiencing a [place] or [place]  check blood pressure 3 times per week choose a place to take my blood pressure (home, clinic or office, retail store) write blood pressure results in a log or diary take blood pressure log to all doctor appointments call doctor for signs and symptoms of high blood pressure develop an action plan for high blood pressure keep all doctor appointments take medications for blood pressure exactly as prescribed begin an exercise program report new symptoms to your doctor limit salt intake to 2300 mg/day   Our next appointment is by telephone on February 2023  Please call Johny Shock at [phone]  if you need to cancel or  reschedule your appointment.   Please call the Suicide and Crisis Lifeline: 988 if you are experiencing a Mental Health or [place] or need someone to talk to.  The patient verbalized understanding of instructions, educational materials, and care plan provided today and agreed to receive a mailed copy of patient instructions, educational materials, and care plan.   Telephone follow up appointment with care management team member scheduled for: The patient has been provided with contact information for the care management team and has been advised to call with any health related questions or concerns.   [place] Care Management [phone]

## 2021-11-21 ENCOUNTER — Ambulatory Visit: Payer: Medicare Other | Admitting: Cardiovascular Disease

## 2021-11-23 NOTE — Telephone Encounter (Signed)
Spoke with pt and gave number for patient assistance for [employer]MSPA[employer].  He will check with them and call and provide an update to [place] who is helping w patient assistance.

## 2021-11-26 ENCOUNTER — Other Ambulatory Visit: Payer: Self-pay | Admitting: Internal Medicine

## 2021-12-02 ENCOUNTER — Other Ambulatory Visit: Payer: Self-pay | Admitting: Cardiovascular Disease

## 2021-12-02 ENCOUNTER — Other Ambulatory Visit: Payer: Self-pay | Admitting: Physician Assistant

## 2021-12-03 ENCOUNTER — Other Ambulatory Visit: Payer: Self-pay | Admitting: Cardiovascular Disease

## 2021-12-05 DIAGNOSIS — I129 Hypertensive chronic kidney disease with stage 1 through stage 4 chronic kidney disease, or unspecified chronic kidney disease: Secondary | ICD-10-CM | POA: Diagnosis not present

## 2021-12-05 DIAGNOSIS — E1122 Type 2 diabetes mellitus with diabetic chronic kidney disease: Secondary | ICD-10-CM | POA: Diagnosis not present

## 2021-12-05 DIAGNOSIS — I4891 Unspecified atrial fibrillation: Secondary | ICD-10-CM | POA: Diagnosis not present

## 2021-12-05 DIAGNOSIS — I5030 Unspecified diastolic (congestive) heart failure: Secondary | ICD-10-CM | POA: Diagnosis not present

## 2021-12-05 DIAGNOSIS — D631 Anemia in chronic kidney disease: Secondary | ICD-10-CM | POA: Diagnosis not present

## 2021-12-05 DIAGNOSIS — N189 Chronic kidney disease, unspecified: Secondary | ICD-10-CM | POA: Diagnosis not present

## 2021-12-05 DIAGNOSIS — E785 Hyperlipidemia, unspecified: Secondary | ICD-10-CM | POA: Diagnosis not present

## 2021-12-05 DIAGNOSIS — E559 Vitamin D deficiency, unspecified: Secondary | ICD-10-CM | POA: Diagnosis not present

## 2021-12-05 DIAGNOSIS — N184 Chronic kidney disease, stage 4 (severe): Secondary | ICD-10-CM | POA: Diagnosis not present

## 2021-12-06 DIAGNOSIS — N184 Chronic kidney disease, stage 4 (severe): Secondary | ICD-10-CM | POA: Diagnosis not present

## 2021-12-09 ENCOUNTER — Other Ambulatory Visit: Payer: Self-pay | Admitting: *Deleted

## 2021-12-09 NOTE — Patient Outreach (Signed)
[facility]) Care Management  12/09/2021  Balta 1941/05/17 [id number]   RN Health Coach attempted follow up outreach call to patient.  Patient was unavailable. HIPPA compliance voicemail message left with return callback number.  Plan: RN will call patient again within 30 days.  [place] Care Management [phone]

## 2021-12-12 DIAGNOSIS — I10 Essential (primary) hypertension: Secondary | ICD-10-CM | POA: Diagnosis not present

## 2021-12-12 DIAGNOSIS — E785 Hyperlipidemia, unspecified: Secondary | ICD-10-CM | POA: Diagnosis not present

## 2021-12-12 DIAGNOSIS — E119 Type 2 diabetes mellitus without complications: Secondary | ICD-10-CM | POA: Diagnosis not present

## 2021-12-26 ENCOUNTER — Other Ambulatory Visit: Payer: Self-pay | Admitting: *Deleted

## 2021-12-26 NOTE — Patient Outreach (Signed)
[place] [facility]) Care Management ? ?12/26/2021 ? ?[place] [birth year] ?[phone] ? ? ?RN Health Coach attempted follow up outreach call to patient.  Patient was unavailable. HIPPA compliance voicemail message left with return callback number. ? ?Plan: ?RN will call patient again within 30 days. ? ?Johny Shock BSN RN ?[facility] Management ?[phone] ? ?

## 2022-01-09 DIAGNOSIS — N184 Chronic kidney disease, stage 4 (severe): Secondary | ICD-10-CM | POA: Diagnosis not present

## 2022-01-10 DIAGNOSIS — Z20822 Contact with and (suspected) exposure to covid-19: Secondary | ICD-10-CM | POA: Diagnosis not present

## 2022-01-18 ENCOUNTER — Other Ambulatory Visit: Payer: Self-pay | Admitting: *Deleted

## 2022-01-19 ENCOUNTER — Telehealth: Payer: Self-pay | Admitting: Pharmacist

## 2022-01-19 DIAGNOSIS — E119 Type 2 diabetes mellitus without complications: Secondary | ICD-10-CM

## 2022-01-19 MED ORDER — BD PEN NEEDLE NANO 2ND GEN 32G X 4 MM MISC: 3 refills | Status: DC

## 2022-01-19 NOTE — Telephone Encounter (Signed)
Patient requests refill for BD Nano Ultra Fine Needles for his insulin pen. He called his endocrinologist last Monday and never got a return call or Rx filled.  ? ? ?

## 2022-01-20 NOTE — Patient Instructions (Signed)
Visit Information ? ?Thank you for taking time to visit with me today. Please don't hesitate to contact me if I can be of assistance to you before our next scheduled telephone appointment. ? ?Following are the goals we discussed today:  ?Current Barriers:  ?Knowledge Deficits related to plan of care for management of HTN and DMII  ? ?RNCM Clinical Goal(s):  ?Patient will verbalize understanding of plan for management of HTN and DMII as evidenced by continuation of monitoring blood pressure, blood sugar and adhering to 2300 mg sodium diabetic diet through collaboration with RN Care manager, provider, and care team.  ? ?Interventions: ?Inter-disciplinary care team collaboration (see longitudinal plan of care) ?Evaluation of current treatment plan related to  self management and patient's adherence to plan as established by provider ? ?Patient Goals/Self-Care Activities: ?Take medications as prescribed   ?Attend all scheduled provider appointments ?Call pharmacy for medication refills 3-7 days in advance of running out of medications ?Perform all self care activities independently  ?Perform IADL's (shopping, preparing meals, housekeeping, managing finances) independently ?Call provider office for new concerns or questions  ?call the Suicide and Crisis Lifeline: 988 if experiencing a Mental Health or [place]  ?schedule appointment with eye doctor ?check blood sugar at prescribed times: twice daily ?check feet daily for cuts, sores or redness ?take the blood sugar meter to all doctor visits ?trim toenails straight across ?drink 6 to 8 glasses of water each day ?manage portion size ?check blood pressure 3 times per week ?choose a place to take my blood pressure (home, clinic or office, retail store) ?write blood pressure results in a log or diary ?take blood pressure log to all doctor appointments ?call doctor for signs and symptoms of high blood pressure ?develop an action plan for high blood  pressure ?keep all doctor appointments ?take medications for blood pressure exactly as prescribed ?begin an exercise program ?report new symptoms to your doctor ?limit salt intake to 2300 mg/day ? ?[id number] Fasting blood sugar 117. 01/17/2022 FBS 133. Per patient he needs needles for his insulin pen. Patient had notified endocrinologist. RN notified endocrinologist and PCP pharmacist. Patient had been doing some yard work for exercising.  ? ?Our next appointment is by telephone on April 25, 2022 ? ?Please call Johny Shock RN  [phone]  if you need to cancel or reschedule your appointment.  ? ?Please call the Suicide and Crisis Lifeline: 988 if you are experiencing a Mental Health or [place] or need someone to talk to. ? ?The patient verbalized understanding of instructions, educational materials, and care plan provided today and agreed to receive a mailed copy of patient instructions, educational materials, and care plan.  ? ?Telephone follow up appointment with care management team member scheduled for: ?The patient has been provided with contact information for the care management team and has been advised to call with any health related questions or concerns.  ? ?SIGNATURE ? ?Johny Shock BSN RN ?[facility] Management ?[phone] ? ? ? ? ?

## 2022-01-20 NOTE — Patient Outreach (Signed)
[place] [facility]) Care Management ?RN Health Coach Note ? ? ?01/20/2022 ?Name:  Jake Gross MRN:  [id number] DOB:  [birth year] ? ?Summary: ?Fasting Blood sugar 117. Per patient appetite is good. He has not been exercising much. He has been doing some yard work. He tires easily.  ? ?Recommendations/Changes made from today's visit: ?Medication adherence ?Monitor blood sugars ?Monitor blood pressure ?Start an exercise routine ?Prescription for Semglee pen needles ? ? ?Subjective: ?Jake Gross is an 81 y.o. year old male who is a primary patient of Michela Pitcher, NP. The care management team was consulted for assistance with care management and/or care coordination needs.   ? ?RN Health Coach completed Telephone Visit today.  ? ?Objective: ? ?Medications Reviewed Today   ? ? Reviewed by Ardeen Garland, RN (Registered Nurse) on 11/08/21 at 1521  Med List Status: <None>  ? ?Medication Order Taking? Sig Documenting Provider Last Dose Status Informant  ?amLODipine (NORVASC) 5 MG tablet [id number] Yes Take 1 tablet (5 mg total) by mouth daily. Pt needs to keep upcoming appt in Feb, 2023 for further refills Burnell Blanks, MD Taking Active   ?apixaban (ELIQUIS) 5 MG TABS tablet [id number] Yes Take 1 tablet (5 mg total) by mouth 2 (two) times daily. Burnell Blanks, MD Taking Active   ?hydrALAZINE (APRESOLINE) 100 MG tablet [id number] Yes Take 1 tablet (100 mg total) by mouth 3 (three) times daily. Patient must keep 11/21/2021 appointment for further refills Jake Burn, PA-C Taking Active   ?insulin glargine-yfgn (SEMGLEE, YFGN,) 100 UNIT/ML Pen [id number] Yes Inject 60 Units into the skin every morning. Jake Shin, MD Taking Active   ?isosorbide mononitrate (IMDUR) 60 MG 24 hr tablet [id number] Yes Take 1 tablet (60 mg total) by mouth daily. Pt needs to keep upcoming appt in Feb, 2023 for further refills Burnell Blanks, MD Taking Active   ?Lancets [facility] ULTRASOFT) lancets  [id number] Yes Use as instructed Jake Shin, MD Taking Active   ?metoprolol tartrate (LOPRESSOR) 25 MG tablet [id number] Yes TAKE 1 AND 1/2 TABLETS BY MOUTH TWICE A DAY NEEDS APPT FOR REFILLS Allred, Jeneen Rinks, MD Taking Active   ?rosuvastatin (CRESTOR) 20 MG tablet [id number] Yes TAKE 1 TABLET BY MOUTH AT BEDTIME. SCHEDULE PHYSICAL Knute Neu, Abby Potash B, FNP Taking Active   ?tamsulosin (FLOMAX) 0.4 MG CAPS capsule [id number] Yes TAKE 1 CAPSULE BY MOUTH EVERY DAY Baity, Jake Keens, NP Taking Active   ?torsemide (DEMADEX) 20 MG tablet [id number] Yes Take 1 tablet (20 mg total) by mouth 2 (two) times daily. Pt needs to keep upcoming appt in Feb, 2023 for further refills Burnell Blanks, MD Taking Active   ? ?  ?  ? ?  ? ? ? ?SDOH:  (Social Determinants of Health) assessments and interventions performed:  ? ? ?Care Plan ? ?Review of patient past medical history, allergies, medications, health status, including review of consultants reports, laboratory and other test data, was performed as part of comprehensive evaluation for care management services.  ?Current Barriers:  ?Knowledge Deficits related to plan of care for management of HTN and DMII  ? ?RNCM Clinical Goal(s):  ?Patient will verbalize understanding of plan for management of HTN and DMII as evidenced by continuation of monitoring blood pressure, blood sugar and adhering to 2300 mg sodium diabetic diet through collaboration with RN Care manager, provider, and care team.  ? ?Interventions: ?Inter-disciplinary care team collaboration (see longitudinal plan of care) ?Evaluation of current treatment plan related to  self management and patient's adherence to plan as established by provider ? ?Patient Goals/Self-Care Activities: ?Take medications as prescribed   ?Attend all scheduled provider appointments ?Call pharmacy for medication refills 3-7 days in advance of running out of medications ?Perform all self care activities independently  ?Perform IADL's (shopping,  preparing meals, housekeeping, managing finances) independently ?Call provider office for new concerns or questions  ?call the Suicide and Crisis Lifeline: 988 if experiencing a Mental Health or [place]  ?schedule appointment with eye doctor ?check blood sugar at prescribed times: twice daily ?check feet daily for cuts, sores or redness ?take the blood sugar meter to all doctor visits ?trim toenails straight across ?drink 6 to 8 glasses of water each day ?manage portion size ?check blood pressure 3 times per week ?choose a place to take my blood pressure (home, clinic or office, retail store) ?write blood pressure results in a log or diary ?take blood pressure log to all doctor appointments ?call doctor for signs and symptoms of high blood pressure ?develop an action plan for high blood pressure ?keep all doctor appointments ?take medications for blood pressure exactly as prescribed ?begin an exercise program ?report new symptoms to your doctor ?limit salt intake to 2300 mg/day ? ?[id number] Fasting blood sugar 117. 01/17/2022 FBS 133. Per patient he needs needles for his insulin pen. Patient had notified endocrinologist. RN notified endocrinologist and PCP pharmacist. Patient had been doing some yard work for exercising.  ? ? ?Plan: Telephone follow up appointment with care management team member scheduled for:  April 25, 2022 ?The patient has been provided with contact information for the care management team and has been advised to call with any health related questions or concerns.  ? ?Jake Gross BSN RN ?[facility] ?[phone] ? ? ? ?  ?

## 2022-02-09 DIAGNOSIS — Z20822 Contact with and (suspected) exposure to covid-19: Secondary | ICD-10-CM | POA: Diagnosis not present

## 2022-02-19 ENCOUNTER — Other Ambulatory Visit: Payer: Self-pay | Admitting: Cardiovascular Disease

## 2022-02-19 DIAGNOSIS — I480 Paroxysmal atrial fibrillation: Secondary | ICD-10-CM

## 2022-02-20 DIAGNOSIS — N184 Chronic kidney disease, stage 4 (severe): Secondary | ICD-10-CM | POA: Diagnosis not present

## 2022-02-20 DIAGNOSIS — E1122 Type 2 diabetes mellitus with diabetic chronic kidney disease: Secondary | ICD-10-CM | POA: Diagnosis not present

## 2022-02-20 DIAGNOSIS — D631 Anemia in chronic kidney disease: Secondary | ICD-10-CM | POA: Diagnosis not present

## 2022-02-20 DIAGNOSIS — I129 Hypertensive chronic kidney disease with stage 1 through stage 4 chronic kidney disease, or unspecified chronic kidney disease: Secondary | ICD-10-CM | POA: Diagnosis not present

## 2022-02-20 DIAGNOSIS — E875 Hyperkalemia: Secondary | ICD-10-CM | POA: Diagnosis not present

## 2022-02-20 DIAGNOSIS — I2510 Atherosclerotic heart disease of native coronary artery without angina pectoris: Secondary | ICD-10-CM | POA: Diagnosis not present

## 2022-02-20 DIAGNOSIS — I4891 Unspecified atrial fibrillation: Secondary | ICD-10-CM | POA: Diagnosis not present

## 2022-02-20 NOTE — Telephone Encounter (Signed)
Eliquis '5mg'$  refill request received. Patient is 81 years old, weight-97.8kg, Crea-2.05 on 11/09/2021, Diagnosis-Afib, and last seen by Dr. Angelena Form on 08/08/2021. Dose is appropriate based on dosing criteria. Will send in refill to requested pharmacy.   ?

## 2022-03-10 DIAGNOSIS — N184 Chronic kidney disease, stage 4 (severe): Secondary | ICD-10-CM | POA: Diagnosis not present

## 2022-04-03 ENCOUNTER — Telehealth: Payer: Self-pay | Admitting: Internal Medicine

## 2022-04-03 NOTE — Telephone Encounter (Signed)
Called patient regarding upcoming July appointment, patient has been called and voicemail was left. 

## 2022-04-25 ENCOUNTER — Other Ambulatory Visit: Payer: Self-pay | Admitting: *Deleted

## 2022-04-25 NOTE — Patient Outreach (Signed)
[place] [facility]) Care Management [place] Note   04/25/2022 Name:  Jake Gross MRN:  [id number] DOB:  09-01-1941  Summary: Patient blood pressure has been elevated. Per patient today it is 146/74.   He stated that currently the swelling has decreased. Per patient he is having difficulty affording his medications  Recommendations/Changes made from today's visit: Referred to pharmacy Patient is to monitor blood pressure and document RN will follow up with in a month for readings.    Subjective: Jake Gross is an 81 y.o. year old male who is a primary patient of Michela Pitcher, NP. The care management team was consulted for assistance with care management and/or care coordination needs.    RN Health Coach completed Telephone Visit today.   Objective:  Medications Reviewed Today     Reviewed by Ardeen Garland, RN (Registered Nurse) on 11/08/21 at 1521  Med List Status: <None>   Medication Order Taking? Sig Documenting Provider Last Dose Status Informant  amLODipine (NORVASC) 5 MG tablet [id number] Yes Take 1 tablet (5 mg total) by mouth daily. Pt needs to keep upcoming appt in Feb, 2023 for further refills Burnell Blanks, MD Taking Active   apixaban (ELIQUIS) 5 MG TABS tablet [id number] Yes Take 1 tablet (5 mg total) by mouth 2 (two) times daily. Burnell Blanks, MD Taking Active   hydrALAZINE (APRESOLINE) 100 MG tablet [id number] Yes Take 1 tablet (100 mg total) by mouth 3 (three) times daily. Patient must keep 11/21/2021 appointment for further refills Imogene Burn, PA-C Taking Active   insulin glargine-yfgn (SEMGLEE, YFGN,) 100 UNIT/ML Pen [id number] Yes Inject 60 Units into the skin every morning. Renato Shin, MD Taking Active   isosorbide mononitrate (IMDUR) 60 MG 24 hr tablet [id number] Yes Take 1 tablet (60 mg total) by mouth daily. Pt needs to keep upcoming appt in Feb, 2023 for further refills Burnell Blanks, MD Taking Active    Lancets [facility] ULTRASOFT) lancets [id number] Yes Use as instructed Renato Shin, MD Taking Active   metoprolol tartrate (LOPRESSOR) 25 MG tablet [id number] Yes TAKE 1 AND 1/2 TABLETS BY MOUTH TWICE A DAY NEEDS APPT FOR REFILLS Allred, Jeneen Rinks, MD Taking Active   rosuvastatin (CRESTOR) 20 MG tablet [id number] Yes TAKE 1 TABLET BY MOUTH AT BEDTIME. SCHEDULE PHYSICAL Knute Neu, Abby Potash B, FNP Taking Active   tamsulosin (FLOMAX) 0.4 MG CAPS capsule [id number] Yes TAKE 1 CAPSULE BY MOUTH EVERY DAY Baity, Coralie Keens, NP Taking Active   torsemide (DEMADEX) 20 MG tablet [id number] Yes Take 1 tablet (20 mg total) by mouth 2 (two) times daily. Pt needs to keep upcoming appt in Feb, 2023 for further refills Burnell Blanks, MD Taking Active              SDOH:  (Social Determinants of Health) assessments and interventions performed:    Care Plan  Review of patient past medical history, allergies, medications, health status, including review of consultants reports, laboratory and other test data, was performed as part of comprehensive evaluation for care management services.   There are no care plans that you recently modified to display for this patient.    Plan: Telephone follow up appointment with care management team member scheduled for:  May 26, 2022 The patient has been provided with contact information for the care management team and has been advised to call with any health related questions or concerns.   [facility] [phone]

## 2022-04-25 NOTE — Patient Outreach (Signed)
[place] [facility]) Care Management

## 2022-04-27 ENCOUNTER — Ambulatory Visit (INDEPENDENT_AMBULATORY_CARE_PROVIDER_SITE_OTHER): Payer: Medicare Other | Admitting: Nurse Practitioner

## 2022-04-27 VITALS — BP 140/64 | HR 54 | Temp 97.9°F | Resp 16 | Ht 67.0 in | Wt 205.4 lb

## 2022-04-27 DIAGNOSIS — Z136 Encounter for screening for cardiovascular disorders: Secondary | ICD-10-CM

## 2022-04-27 DIAGNOSIS — I480 Paroxysmal atrial fibrillation: Secondary | ICD-10-CM

## 2022-04-27 DIAGNOSIS — E119 Type 2 diabetes mellitus without complications: Secondary | ICD-10-CM | POA: Diagnosis not present

## 2022-04-27 DIAGNOSIS — R351 Nocturia: Secondary | ICD-10-CM

## 2022-04-27 DIAGNOSIS — Z794 Long term (current) use of insulin: Secondary | ICD-10-CM | POA: Diagnosis not present

## 2022-04-27 DIAGNOSIS — Z7901 Long term (current) use of anticoagulants: Secondary | ICD-10-CM | POA: Diagnosis not present

## 2022-04-27 DIAGNOSIS — I10 Essential (primary) hypertension: Secondary | ICD-10-CM

## 2022-04-27 DIAGNOSIS — N184 Chronic kidney disease, stage 4 (severe): Secondary | ICD-10-CM | POA: Diagnosis not present

## 2022-04-27 NOTE — Assessment & Plan Note (Signed)
Patient is unsure if he is taking tamsulosin or not.  He will check when he gets home if not we can refill tamsulosin this will help with his nocturia

## 2022-04-27 NOTE — Assessment & Plan Note (Signed)
Was being seen by Edrick Oh as a nephrologist.  Per patient report nephrologist is left practice he is can be seeing somebody else.  Continue following up with them as recommended pending renal function today

## 2022-04-27 NOTE — Assessment & Plan Note (Signed)
Currently maintained on Eliquis and is followed by Dr. Angelena Form and Dr. Rayann Heman.

## 2022-04-27 NOTE — Assessment & Plan Note (Signed)
Patient was followed by Dr. Renato Shin.  He has since left the practice and patient has not had a follow-up with another provider currently currently on insulin glargine 60 units every morning.  Last A1c was 7.2 we will check A1c with labs today.  Pending results

## 2022-04-27 NOTE — Progress Notes (Signed)
Established Patient Office Visit  Subjective   Patient ID: Jake Gross, male    DOB: 04-29-1941  Age: 81 y.o. MRN: [id number]  Chief Complaint  Patient presents with   Follow-up    6 months      DM2: Was being seen by Dr. Renato Shin. Checks glucose in the morning. States that this morning it was 87. Averaging: Below 100 No hypoglycemia per patient report  HTN/CAD/CHF: Dr. Angelena Form is the patients cardiologist.  Patient also sees Dr. Rayann Heman for electrophysiology.  He is currently on Eliquis that is costing him a lot. He states it was 264.  CKD: Edrick Oh? Left that practice He thinks he has appointment the 18th of this month.  MGUS: Dr. Curt Bears     Review of Systems  Constitutional:  Negative for chills and fever.  Respiratory:  Positive for shortness of breath.   Cardiovascular:  Negative for chest pain, palpitations and leg swelling.  Gastrointestinal:  Negative for blood in stool, diarrhea, nausea and vomiting.       BM daily  Genitourinary:  Positive for frequency.       Nocturia x3  Neurological:  Negative for dizziness and headaches.      Objective:     BP 140/64   Pulse (!) 54   Temp 97.9 F (36.6 C)   Resp 16   Ht '5\' 7"'$  (1.702 m)   Wt 205 lb 6 oz (93.2 kg)   SpO2 97%   BMI 32.17 kg/m  BP Readings from Last 3 Encounters:  04/27/22 140/64  11/08/21 (!) 156/71  11/01/21 (!) 170/62   Wt Readings from Last 3 Encounters:  04/27/22 205 lb 6 oz (93.2 kg)  11/08/21 215 lb 8 oz (97.8 kg)  11/01/21 213 lb 12.8 oz (97 kg)      Physical Exam Vitals and nursing note reviewed.  Constitutional:      Appearance: Normal appearance.  Cardiovascular:     Rate and Rhythm: Normal rate and regular rhythm.     Pulses: Normal pulses.     Heart sounds: Normal heart sounds.  Pulmonary:     Effort: Pulmonary effort is normal.     Breath sounds: Normal breath sounds.  Abdominal:     General: Bowel sounds are normal.  Musculoskeletal:      Right lower leg: No edema.     Left lower leg: No edema.  Neurological:     Mental Status: He is alert.      No results found for any visits on 04/27/22.    The ASCVD Risk score (Arnett DK, et al., 2019) failed to calculate for the following reasons:   The patient has a prior MI or stroke diagnosis    Assessment & Plan:   Problem List Items Addressed This Visit       Cardiovascular and Mediastinum   Essential hypertension - Primary    Patient is followed by cardiology he is currently maintained on amlodipine, hydralazine, Imdur, and metoprolol.  Continue taking medication as prescribed follow-up as recommended      Relevant Orders   CBC   Comprehensive metabolic panel   Lipid panel   Paroxysmal atrial fibrillation (HCC)    Currently maintained on Eliquis and is followed by Dr. Angelena Form and Dr. Rayann Heman.        Endocrine   Diabetes mellitus type 2, insulin dependent ([place])    Patient was followed by Dr. Renato Shin.  He has since left the practice and patient  has not had a follow-up with another provider currently currently on insulin glargine 60 units every morning.  Last A1c was 7.2 we will check A1c with labs today.  Pending results      Relevant Orders   Hemoglobin A1c   Lipid panel     Genitourinary   CKD (chronic kidney disease)    Was being seen by Edrick Oh as a nephrologist.  Per patient report nephrologist is left practice he is can be seeing somebody else.  Continue following up with them as recommended pending renal function today      Relevant Orders   Comprehensive metabolic panel     Other   Chronic anticoagulation   Nocturia    Patient is unsure if he is taking tamsulosin or not.  He will check when he gets home if not we can refill tamsulosin this will help with his nocturia       Return in about 6 months (around 10/28/2022) for Medicare annual wellness visit.Romilda Garret, NP

## 2022-04-27 NOTE — Assessment & Plan Note (Signed)
Patient is followed by cardiology he is currently maintained on amlodipine, hydralazine, Imdur, and metoprolol.  Continue taking medication as prescribed follow-up as recommended

## 2022-04-27 NOTE — Patient Instructions (Signed)
Nice to see you today. I will be in touch with the lab results Follow up with me in 6 months, sooner if needed

## 2022-04-28 LAB — COMPREHENSIVE METABOLIC PANEL
ALT: 13 U/L (ref 0–53)
AST: 17 U/L (ref 0–37)
Albumin: 3.8 g/dL (ref 3.5–5.2)
Alkaline Phosphatase: 80 U/L (ref 39–117)
BUN: 62 mg/dL — ABNORMAL HIGH (ref 6–23)
CO2: 24 mEq/L (ref 19–32)
Calcium: 9.3 mg/dL (ref 8.4–10.5)
Chloride: 106 mEq/L (ref 96–112)
Creatinine, Ser: 2.7 mg/dL — ABNORMAL HIGH (ref 0.40–1.50)
GFR: 21.76 mL/min — ABNORMAL LOW (ref >60.00)
Glucose, Bld: 155 mg/dL — ABNORMAL HIGH (ref 70–99)
Potassium: 5.6 mEq/L — ABNORMAL HIGH (ref 3.5–5.1)
Sodium: 139 mEq/L (ref 135–145)
Total Bilirubin: 0.4 mg/dL (ref 0.2–1.2)
Total Protein: 7.2 g/dL (ref 6.0–8.3)

## 2022-04-28 LAB — LIPID PANEL
Cholesterol: 187 mg/dL (ref 0–200)
HDL: 23.7 mg/dL — ABNORMAL LOW (ref >39.00)
Total CHOL/HDL Ratio: 8
Triglycerides: 486 mg/dL — ABNORMAL HIGH (ref 0.0–149.0)

## 2022-04-28 LAB — CBC
HCT: 36 % — ABNORMAL LOW (ref 39.0–52.0)
Hemoglobin: 11.8 g/dL — ABNORMAL LOW (ref 13.0–17.0)
MCHC: 32.7 g/dL (ref 30.0–36.0)
MCV: 79.6 fl (ref 78.0–100.0)
Platelets: 251 10*3/uL (ref 150.0–400.0)
RBC: 4.53 Mil/uL (ref 4.22–5.81)
RDW: 15.9 % — ABNORMAL HIGH (ref 11.5–15.5)
WBC: 9.3 10*3/uL (ref 4.0–10.5)

## 2022-04-28 LAB — HEMOGLOBIN A1C: Hgb A1c MFr Bld: 6.9 % — ABNORMAL HIGH (ref 4.6–6.5)

## 2022-04-28 LAB — LDL CHOLESTEROL, DIRECT: Direct LDL: 98 mg/dL

## 2022-05-02 ENCOUNTER — Other Ambulatory Visit: Payer: Self-pay

## 2022-05-02 ENCOUNTER — Inpatient Hospital Stay: Payer: Medicare Other | Attending: Internal Medicine

## 2022-05-02 DIAGNOSIS — D472 Monoclonal gammopathy: Secondary | ICD-10-CM | POA: Diagnosis not present

## 2022-05-02 DIAGNOSIS — N189 Chronic kidney disease, unspecified: Secondary | ICD-10-CM | POA: Insufficient documentation

## 2022-05-02 DIAGNOSIS — D649 Anemia, unspecified: Secondary | ICD-10-CM | POA: Diagnosis not present

## 2022-05-02 DIAGNOSIS — E875 Hyperkalemia: Secondary | ICD-10-CM | POA: Insufficient documentation

## 2022-05-02 LAB — CBC WITH DIFFERENTIAL (CANCER CENTER ONLY)
Abs Immature Granulocytes: 0.03 10*3/uL (ref 0.00–0.07)
Basophils Absolute: 0.1 10*3/uL (ref 0.0–0.1)
Basophils Relative: 1 %
Eosinophils Absolute: 0.3 10*3/uL (ref 0.0–0.5)
Eosinophils Relative: 3 %
HCT: 37.4 % — ABNORMAL LOW (ref 39.0–52.0)
Hemoglobin: 11.8 g/dL — ABNORMAL LOW (ref 13.0–17.0)
Immature Granulocytes: 0 %
Lymphocytes Relative: 20 %
Lymphs Abs: 1.8 10*3/uL (ref 0.7–4.0)
MCH: 25.6 pg — ABNORMAL LOW (ref 26.0–34.0)
MCHC: 31.6 g/dL (ref 30.0–36.0)
MCV: 81.1 fL (ref 80.0–100.0)
Monocytes Absolute: 1 10*3/uL (ref 0.1–1.0)
Monocytes Relative: 11 %
Neutro Abs: 6.1 10*3/uL (ref 1.7–7.7)
Neutrophils Relative %: 65 %
Platelet Count: 246 10*3/uL (ref 150–400)
RBC: 4.61 MIL/uL (ref 4.22–5.81)
RDW: 15.7 % — ABNORMAL HIGH (ref 11.5–15.5)
WBC Count: 9.3 10*3/uL (ref 4.0–10.5)
nRBC: 0 % (ref 0.0–0.2)

## 2022-05-02 LAB — CMP (CANCER CENTER ONLY)
ALT: 11 U/L (ref 0–44)
AST: 16 U/L (ref 15–41)
Albumin: 3.5 g/dL (ref 3.5–5.0)
Alkaline Phosphatase: 76 U/L (ref 38–126)
Anion gap: 6 (ref 5–15)
BUN: 47 mg/dL — ABNORMAL HIGH (ref 8–23)
CO2: 25 mmol/L (ref 22–32)
Calcium: 9.1 mg/dL (ref 8.9–10.3)
Chloride: 112 mmol/L — ABNORMAL HIGH (ref 98–111)
Creatinine: 2.37 mg/dL — ABNORMAL HIGH (ref 0.61–1.24)
GFR, Estimated: 27 mL/min — ABNORMAL LOW (ref >60)
Glucose, Bld: 108 mg/dL — ABNORMAL HIGH (ref 70–99)
Potassium: 5.3 mmol/L — ABNORMAL HIGH (ref 3.5–5.1)
Sodium: 143 mmol/L (ref 135–145)
Total Bilirubin: 0.6 mg/dL (ref 0.3–1.2)
Total Protein: 6.9 g/dL (ref 6.5–8.1)

## 2022-05-02 LAB — LACTATE DEHYDROGENASE: LDH: 151 U/L (ref 98–192)

## 2022-05-03 LAB — BETA 2 MICROGLOBULIN, SERUM: Beta-2 Microglobulin: 6.6 mg/L — ABNORMAL HIGH (ref 0.6–2.4)

## 2022-05-03 LAB — IGG, IGA, IGM
IgA: 459 mg/dL — ABNORMAL HIGH (ref 61–437)
IgG (Immunoglobin G), Serum: 990 mg/dL (ref 603–1613)
IgM (Immunoglobulin M), Srm: 143 mg/dL (ref 15–143)

## 2022-05-03 LAB — KAPPA/LAMBDA LIGHT CHAINS
Kappa free light chain: 105 mg/L — ABNORMAL HIGH (ref 3.3–19.4)
Kappa, lambda light chain ratio: 2.3 — ABNORMAL HIGH (ref 0.26–1.65)
Lambda free light chains: 45.6 mg/L — ABNORMAL HIGH (ref 5.7–26.3)

## 2022-05-09 ENCOUNTER — Encounter: Payer: Self-pay | Admitting: Internal Medicine

## 2022-05-09 ENCOUNTER — Other Ambulatory Visit: Payer: Self-pay

## 2022-05-09 ENCOUNTER — Inpatient Hospital Stay (HOSPITAL_BASED_OUTPATIENT_CLINIC_OR_DEPARTMENT_OTHER): Payer: Medicare Other | Admitting: Internal Medicine

## 2022-05-09 VITALS — BP 147/73 | HR 55 | Temp 97.1°F | Resp 16 | Wt 207.4 lb

## 2022-05-09 DIAGNOSIS — D472 Monoclonal gammopathy: Secondary | ICD-10-CM | POA: Diagnosis not present

## 2022-05-09 DIAGNOSIS — D649 Anemia, unspecified: Secondary | ICD-10-CM | POA: Diagnosis not present

## 2022-05-09 DIAGNOSIS — N189 Chronic kidney disease, unspecified: Secondary | ICD-10-CM | POA: Diagnosis not present

## 2022-05-09 DIAGNOSIS — E875 Hyperkalemia: Secondary | ICD-10-CM | POA: Diagnosis not present

## 2022-05-09 NOTE — Progress Notes (Signed)
[place] Telephone:(336) [phone]   Fax:([phone]  OFFICE PROGRESS NOTE  Michela Pitcher, NP [place] [address]  DIAGNOSIS: Monoclonal gammopathy of undetermined significance (MGUS)  PRIOR THERAPY: None  CURRENT THERAPY: Observation  INTERVAL HISTORY: Jake Gross 81 y.o. male returns to the clinic today for follow-up visit.  The patient is feeling fine today with no concerning complaints except for mild fatigue.  He was followed by Dr. Justin Mend from [facility] kidney but has not established care with another physician after Dr. Justin Mend left the practice.  He denied having any current chest pain, shortness of breath, cough or hemoptysis.  He has no nausea, vomiting, diarrhea or constipation.  He has no headache or visual changes.  He denied having any fever or chills.  He is here today for evaluation with repeat myeloma panel.  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  MEDICATIONS:  Current Outpatient Medications  Medication Sig Dispense Refill   amLODipine (NORVASC) 5 MG tablet TAKE 1 TABLET EVERY DAY 90 tablet 2   ELIQUIS 5 MG TABS tablet  TAKE 1 TABLET BY MOUTH TWICE A DAY 60 tablet 5   hydrALAZINE (APRESOLINE) 100 MG tablet Take 1 tablet (100 mg total) by mouth 3 (three) times daily. 270 tablet 2   insulin glargine-yfgn (SEMGLEE, YFGN,) 100 UNIT/ML Pen Inject 60 Units into the skin every morning. 60 mL 3   Insulin Pen Needle (BD PEN NEEDLE NANO 2ND GEN) 32G X 4 MM MISC Use with insulin pen as directed 100 each 3   isosorbide mononitrate (IMDUR) 60 MG 24 hr tablet TAKE 1 TABLET EVERY DAY 90 tablet 2   Lancets (ONETOUCH ULTRASOFT) lancets Use as instructed 100 each 12   metoprolol tartrate (LOPRESSOR) 25 MG tablet TAKE 1 AND 1/2 TABLETS BY MOUTH TWICE A DAY NEEDS APPT FOR REFILLS 270 tablet 2   rosuvastatin (CRESTOR) 20 MG tablet TAKE 1 TABLET BY MOUTH AT BEDTIME. SCHEDULE PHYSICAL EXAM 30 tablet 0   tamsulosin (FLOMAX) 0.4 MG CAPS capsule TAKE 1 CAPSULE BY MOUTH EVERY DAY 90 capsule 1   torsemide (DEMADEX) 20 MG tablet TAKE 1 TABLET BY MOUTH TWICE A DAY 180 tablet 2   No current facility-administered medications for this visit.    SURGICAL HISTORY:  Past Surgical History:  Procedure Laterality Date   ABCESS DRAINAGE Left    buttocks   CARDIOVASCULAR STRESS TEST  10/12/1999   EF 63%. NO ISCHEMIA   COLONOSCOPY W/ POLYPECTOMY     5 polyps   COLONOSCOPY WITH PROPOFOL N/A 08/29/2019   Procedure: COLONOSCOPY WITH PROPOFOL;  Surgeon:  Doran Stabler, MD;  Location: [place];  Service: Gastroenterology;  Laterality: N/A;   CORONARY ARTERY BYPASS GRAFT N/A 11/26/2018   Procedure: CORONARY ARTERY BYPASS GRAFTING (CABG), ON PUMP, TIMES FOUR, USING LEFT INTERNAL MAMMARY ARTERY AND ENDOSCOPICALLY HARVESTED LEFT SAPHENOUS VEIN;  Surgeon: Gaye Pollack, MD;  Location: [place];  Service: Open Heart Surgery;  Laterality: N/A;   ESOPHAGOGASTRODUODENOSCOPY (EGD) WITH PROPOFOL N/A 08/29/2019   Procedure: ESOPHAGOGASTRODUODENOSCOPY (EGD) WITH PROPOFOL;  Surgeon: Doran Stabler, MD;  Location: [place];  Service: Gastroenterology;   Laterality: N/A;   EUS N/A 03/11/2015   Procedure: LOWER ENDOSCOPIC ULTRASOUND (EUS);  Surgeon: Milus Banister, MD;  Location: [facility];  Service: Endoscopy;  Laterality: N/A;   FLEXIBLE SIGMOIDOSCOPY N/A 02/02/2015   Procedure: FLEXIBLE SIGMOIDOSCOPY;  Surgeon: Inda Castle, MD;  Location: [facility];  Service: Endoscopy;  Laterality: N/A;  ERBE   HEMOSTASIS CLIP PLACEMENT  08/29/2019   Procedure: HEMOSTASIS CLIP PLACEMENT;  Surgeon: Doran Stabler, MD;  Location: [facility] [place];  Service: Gastroenterology;;   HOT HEMOSTASIS N/A 08/29/2019   Procedure: HOT HEMOSTASIS (ARGON PLASMA COAGULATION/BICAP);  Surgeon: Doran Stabler, MD;  Location: [place];  Service: Gastroenterology;  Laterality: N/A;   LEFT HEART CATH AND CORONARY ANGIOGRAPHY N/A 11/20/2018   Procedure: LEFT HEART CATH AND CORONARY ANGIOGRAPHY;  Surgeon: Burnell Blanks, MD;  Location: [place] CV LAB;  Service: Cardiovascular;  Laterality: N/A;   LOOP RECORDER INSERTION N/A 08/14/2017   Procedure: LOOP RECORDER INSERTION;  Surgeon: Thompson Grayer, MD;  Location: [place] CV LAB;  Service: Cardiovascular;  Laterality: N/A;   PARTIAL PROCTECTOMY BY TEM N/A 04/08/2015   Procedure: TEM PARTIAL PROCTECTOMY OF RECTAL MASS;  Surgeon: Michael Boston, MD;  Location: [facility];  Service: General;  Laterality: N/A;   POLYPECTOMY  08/29/2019   Procedure: POLYPECTOMY;  Surgeon: Doran Stabler, MD;  Location: [facility] ENDOSCOPY;  Service: Gastroenterology;;   TEE WITHOUT CARDIOVERSION N/A 06/25/2017   Procedure: TRANSESOPHAGEAL ECHOCARDIOGRAM (TEE);  Surgeon: Skeet Latch, MD;  Location: [place];  Service: Cardiovascular;  Laterality: N/A;   TEE WITHOUT CARDIOVERSION N/A 11/26/2018   Procedure: TRANSESOPHAGEAL ECHOCARDIOGRAM (TEE);  Surgeon: Gaye Pollack, MD;  Location: [place];  Service: Open Heart Surgery;  Laterality: N/A;   TONSILLECTOMY AND ADENOIDECTOMY     as child    REVIEW OF SYSTEMS:  A comprehensive  review of systems was negative except for: Constitutional: positive for fatigue   PHYSICAL EXAMINATION: General appearance: alert, cooperative, fatigued, and no distress Head: Normocephalic, without obvious abnormality, atraumatic Neck: no adenopathy, no JVD, supple, symmetrical, trachea midline, and thyroid not enlarged, symmetric, no tenderness/mass/nodules Lymph nodes: Cervical, supraclavicular, and axillary nodes normal. Resp: clear to auscultation bilaterally Back: symmetric, no curvature. ROM normal. No CVA tenderness. Cardio: regular rate and rhythm, S1, S2 normal, no murmur, click, rub or gallop GI: soft, non-tender; bowel sounds normal; no masses,  no organomegaly Extremities: extremities normal, atraumatic, no cyanosis or edema  ECOG PERFORMANCE STATUS: 1 - Symptomatic but completely ambulatory  Blood pressure (!) 147/73, pulse (!) 55, temperature (!) 97.1 F (36.2 C), temperature source Tympanic, resp. rate 16, weight 207 lb 6.4 oz (94.1 kg), SpO2 96 %.  LABORATORY DATA: Lab Results  Component Value Date   WBC 9.3 05/02/2022   HGB 11.8 (L) 05/02/2022   HCT 37.4 (L) 05/02/2022   MCV 81.1 05/02/2022   PLT 246 05/02/2022      Chemistry  Component Value Date/Time   NA 143 05/02/2022 0845   NA 141 07/20/2021 1552   K 5.3 (H) 05/02/2022 0845   CL 112 (H) 05/02/2022 0845   CO2 25 05/02/2022 0845   BUN 47 (H) 05/02/2022 0845   BUN 58 (H) 07/20/2021 1552   CREATININE 2.37 (H) 05/02/2022 0845   CREATININE 2.33 (H) 10/28/2021 1510   GLU 207 01/19/2016 0000      Component Value Date/Time   CALCIUM 9.1 05/02/2022 0845   ALKPHOS 76 05/02/2022 0845   AST 16 05/02/2022 0845   ALT 11 05/02/2022 0845   BILITOT 0.6 05/02/2022 0845       RADIOGRAPHIC STUDIES: No results found.  ASSESSMENT AND PLAN: This is a very pleasant 81 years old white male recently diagnosed with monoclonal gammopathy of undetermined significance.  The patient had a bone marrow biopsy and  aspirate performed. His biopsy showed presence of 4% of the plasma cells with lack of large aggregates or sheets in the clots and biopsy section.  The plasma cells display polyclonal staining pattern for kappa and lambda light chains and no diagnostic definitive evidence of plasma cell neoplasm and no evidence of lymphoproliferative disorder. The patient is currently on observation and he is feeling fine. Repeat myeloma panel showed no concerning findings for disease progression.  The patient continues to have the renal insufficiency and mild anemia.  He also has mild hyperkalemia and he is currently off all the medication and supplements that has potassium in it. I recommended for the patient to continue on observation with repeat myeloma panel in 6 months. For the renal insufficiency he was advised to continue his care with nephrology. He was advised to call immediately if he has any other concerning symptoms in the interval. The patient voices understanding of current disease status and treatment options and is in agreement with the current care plan.  All questions were answered. The patient knows to call the clinic with any problems, questions or concerns. We can certainly see the patient much sooner if necessary.  The total time spent in the appointment was 20 minutes.  Disclaimer: This note was dictated with voice recognition software. Similar sounding words can inadvertently be transcribed and may not be corrected upon review.

## 2022-05-09 NOTE — Patient Instructions (Signed)
Thank you for choosing [facility] to provide your care.   Should you have questions after your visit to the  [facility] ([facility]), please contact this office at [phone] between 8:30 AM and 4:30 PM.  Voice mails left after 4:00 PM may not be returned until the following business day.  Calls received after 4:30 PM will be answered by an off-site Nurse Triage Line.    Prescription Refills:  Please have your pharmacy contact us directly for most prescription requests.  Contact the office directly for refills of narcotics (pain medications). Allow 48-72 hours for refills.  Appointments: Please contact the [facility] scheduling department [phone] for questions regarding [facility] appointment scheduling.  Contact the schedulers with any scheduling changes so that your appointment can be rescheduled in a timely manner.   Central Scheduling for [place] [phone] - Call to schedule PET scan, CT scan, MRI, and Ultrasound.  To afford each patient quality time with our providers, please arrive 30 minutes before your scheduled appointment time.  If you arrive late for your appointment, you may be asked to reschedule.  We strive to give you quality time with our providers, and arriving late affects you and other patients whose appointments are after yours. If you are a no show for multiple scheduled visits, you may be dismissed from the clinic at the providers discretion.     Resources: [facility] Social Workers [phone] for additional information on assistance programs or assistance connecting with community support programs   [place] DSS  336-641-3447: Information regarding food stamps, Medicaid, and utility assistance [place] Access [place] 336-333-6589   [place] [employer]'s shared-ride transportation service for eligible riders who have a disability that prevents them from riding the fixed route bus.   Medicare Rights Center 800-333-4114 Helps people with  Medicare understand their rights and benefits, navigate the Medicare system, and secure the quality healthcare they deserve American [employer] Society 800-227-2345 Assists patients locate various types of support and financial assistance Cancer Care: 1-800-813-HOPE (4673) Provides financial assistance, online support groups, medication/co-pay assistance.   Transportation Assistance for appointments at [facility]:Please inform scheduler or provider support staff for referral to Transportation Assistance   Again, thank you for choosing  Cancer Center for your care.       

## 2022-05-10 ENCOUNTER — Encounter: Payer: Self-pay | Admitting: *Deleted

## 2022-05-10 ENCOUNTER — Other Ambulatory Visit: Payer: Self-pay | Admitting: *Deleted

## 2022-05-10 NOTE — Patient Outreach (Signed)
[facility] [facility]) Care Management  05/10/2022  [place] Oct 17, 1940 [id number]   RN Health Coach telephone call to patient.  [employer] compliance verified. [id number] Patient  office visit blood pressure was 147/73. Patient monitors sometimes at home. Patient will continue to resolve outreach follow up calls. RN Health Coach will close case. Refer to Care Coordinator.  Plan RN Health Coach Case Closure Refer to Care Coordinator  [place] [facility] [phone]

## 2022-05-25 DIAGNOSIS — D472 Monoclonal gammopathy: Secondary | ICD-10-CM | POA: Diagnosis not present

## 2022-05-25 DIAGNOSIS — N184 Chronic kidney disease, stage 4 (severe): Secondary | ICD-10-CM | POA: Diagnosis not present

## 2022-05-25 DIAGNOSIS — E875 Hyperkalemia: Secondary | ICD-10-CM | POA: Diagnosis not present

## 2022-05-25 DIAGNOSIS — I129 Hypertensive chronic kidney disease with stage 1 through stage 4 chronic kidney disease, or unspecified chronic kidney disease: Secondary | ICD-10-CM | POA: Diagnosis not present

## 2022-05-25 DIAGNOSIS — E1122 Type 2 diabetes mellitus with diabetic chronic kidney disease: Secondary | ICD-10-CM | POA: Diagnosis not present

## 2022-05-25 DIAGNOSIS — I2510 Atherosclerotic heart disease of native coronary artery without angina pectoris: Secondary | ICD-10-CM | POA: Diagnosis not present

## 2022-05-25 DIAGNOSIS — D631 Anemia in chronic kidney disease: Secondary | ICD-10-CM | POA: Diagnosis not present

## 2022-05-26 ENCOUNTER — Encounter: Payer: Medicare Other | Admitting: *Deleted

## 2022-06-20 ENCOUNTER — Ambulatory Visit: Payer: Self-pay

## 2022-06-20 NOTE — Patient Outreach (Signed)
  Care Coordination   Follow Up Visit Note   06/20/2022 Name: Jake Gross MRN: [id number] DOB: 09-04-1941  Jake Gross is a 81 y.o. year old male who sees Cable, Alyson Locket, NP for primary care. I spoke with  [employer] by phone today.  What matters to the patients health and wellness today?  Patient states he checks his blood pressure on occasion.  He reports yesterday's blood pressure was 140/68.  Patient states he has concerns regarding the high cost of his Eliquis which is $264 for a 30 day supply. Patient states he spends over $300 per month for his medications.  He states his son fills his pill box 2 times per week and his daughter assists his with MyChart / appointments.   Goals Addressed             This Visit's Progress    Patient stated: decrease blood pressure       Advised patient to monitor blood pressure at least 1-2 times per week and document at home Advised patient to follow a low salt diet.  Sent patient education article on Managing Hypertension through [place] Referral to pharmacy for medication assistance for Eliquis          SDOH assessments and interventions completed:  Yes  SDOH Interventions Today    Flowsheet Row Most Recent Value  SDOH Interventions   Food Insecurity Interventions Intervention Not Indicated  Transportation Interventions Intervention Not Indicated        Care Coordination Interventions Activated:  Yes  Care Coordination Interventions:  Yes, provided   Follow up plan: Follow up call scheduled for July 31, 2022 at 2:00 pm    Encounter Outcome:  Pt. Visit Completed   Quinn Plowman RN,BSN,CCM [place] Coordinator [phone]

## 2022-06-20 NOTE — Patient Instructions (Signed)
Visit Information  Thank you for taking time to visit with me today. Please don't hesitate to contact me if I can be of assistance to you.   Following are the goals we discussed today:   Goals Addressed             This Visit's Progress    Patient stated: decrease blood pressure       Advised patient to monitor blood pressure at least 1-2 times per week and document at home Advised patient to follow a low salt diet.  Sent patient education article on Managing Hypertension through [place] Referral to pharmacy for medication assistance for Eliquis          Our next appointment is by telephone on 07/31/22  at 2:00 pm  Please call the care guide team at [phone] if you need to cancel or reschedule your appointment.   If you are experiencing a Mental Health or [place] or need someone to talk to, please call 1-800-273-TALK (toll free, 24 hour hotline)  Patient verbalizes understanding of instructions and care plan provided today and agrees to view in [place]. Active MyChart status and patient understanding of how to access instructions and care plan via MyChart confirmed with patient.     Quinn Plowman RN,BSN,CCM RN Care Manager Coordinator [phone]   Managing Your Hypertension Hypertension, also called high blood pressure, is when the force of the blood pressing against the walls of the arteries is too strong. Arteries are blood vessels that carry blood from your heart throughout your body. Hypertension forces the heart to work harder to pump blood and may cause the arteries to become narrow or stiff. Understanding blood pressure readings A blood pressure reading includes a higher number over a lower number: The first, or top, number is called the systolic pressure. It is a measure of the pressure in your arteries as your heart beats. The second, or bottom number, is called the diastolic pressure. It is a measure of the pressure in your arteries as the  heart relaxes. For most people, a normal blood pressure is below 120/80. Your personal target blood pressure may vary depending on your medical conditions, your age, and other factors. Blood pressure is classified into four stages. Based on your blood pressure reading, your health care provider may use the following stages to determine what type of treatment you need, if any. Systolic pressure and diastolic pressure are measured in a unit called millimeters of mercury (mmHg). Normal Systolic pressure: below 563. Diastolic pressure: below 80. Elevated Systolic pressure: 875-643. Diastolic pressure: below 80. Hypertension stage 1 Systolic pressure: 329-518. Diastolic pressure: 84-16. Hypertension stage 2 Systolic pressure: 606 or above. Diastolic pressure: 90 or above. How can this condition affect me? Managing your hypertension is very important. Over time, hypertension can damage the arteries and decrease blood flow to parts of the body, including the brain, heart, and kidneys. Having untreated or uncontrolled hypertension can lead to: A heart attack. A stroke. A weakened blood vessel (aneurysm). Heart failure. Kidney damage. Eye damage. Memory and concentration problems. Vascular dementia. What actions can I take to manage this condition? Hypertension can be managed by making lifestyle changes and possibly by taking medicines. Your health care provider will help you make a plan to bring your blood pressure within a normal range. You may be referred for counseling on a healthy diet and physical activity. Nutrition  Eat a diet that is high in fiber and potassium, and low in salt (sodium), added  sugar, and fat. An example eating plan is called the DASH diet. DASH stands for Dietary Approaches to Stop Hypertension. To eat this way: Eat plenty of fresh fruits and vegetables. Try to fill one-half of your plate at each meal with fruits and vegetables. Eat whole grains, such as whole-wheat  pasta, brown rice, or whole-grain bread. Fill about one-fourth of your plate with whole grains. Eat low-fat dairy products. Avoid fatty cuts of meat, processed or cured meats, and poultry with skin. Fill about one-fourth of your plate with lean proteins such as fish, chicken without skin, beans, eggs, and tofu. Avoid pre-made and processed foods. These tend to be higher in sodium, added sugar, and fat. Reduce your daily sodium intake. Many people with hypertension should eat less than 1,500 mg of sodium a day. Lifestyle  Work with your health care provider to maintain a healthy body weight or to lose weight. Ask what an ideal weight is for you. Get at least 30 minutes of exercise that causes your heart to beat faster (aerobic exercise) most days of the week. Activities may include walking, swimming, or biking. Include exercise to strengthen your muscles (resistance exercise), such as weight lifting, as part of your weekly exercise routine. Try to do these types of exercises for 30 minutes at least 3 days a week. Do not use any products that contain nicotine or tobacco. These products include cigarettes, chewing tobacco, and vaping devices, such as e-cigarettes. If you need help quitting, ask your health care provider. Control any long-term (chronic) conditions you have, such as high cholesterol or diabetes. Identify your sources of stress and find ways to manage stress. This may include meditation, deep breathing, or making time for fun activities. Alcohol use Do not drink alcohol if: Your health care provider tells you not to drink. You are pregnant, may be pregnant, or are planning to become pregnant. If you drink alcohol: Limit how much you have to: 0-1 drink a day for women. 0-2 drinks a day for men. Know how much alcohol is in your drink. In the [place].S., one drink equals one 12 oz bottle of beer (355 mL), one 5 oz glass of wine (148 mL), or one 1 oz glass of hard liquor (44  mL). Medicines Your health care provider may prescribe medicine if lifestyle changes are not enough to get your blood pressure under control and if: Your systolic blood pressure is 130 or higher. Your diastolic blood pressure is 80 or higher. Take medicines only as told by your health care provider. Follow the directions carefully. Blood pressure medicines must be taken as told by your health care provider. The medicine does not work as well when you skip doses. Skipping doses also puts you at risk for problems. Monitoring Before you monitor your blood pressure: Do not smoke, drink caffeinated beverages, or exercise within 30 minutes before taking a measurement. Use the bathroom and empty your bladder (urinate). Sit quietly for at least 5 minutes before taking measurements. Monitor your blood pressure at home as told by your health care provider. To do this: Sit with your back straight and supported. Place your feet flat on the floor. Do not cross your legs. Support your arm on a flat surface, such as a table. Make sure your upper arm is at heart level. Each time you measure, take two or three readings one minute apart and record the results. You may also need to have your blood pressure checked regularly by your health care provider. General  information Talk with your health care provider about your diet, exercise habits, and other lifestyle factors that may be contributing to hypertension. Review all the medicines you take with your health care provider because there may be side effects or interactions. Keep all follow-up visits. Your health care provider can help you create and adjust your plan for managing your high blood pressure. Where to find more information National Heart, Lung, and Blood Institute: https://wilson-eaton.com/ American Heart Association: www.heart.org Contact a health care provider if: You think you are having a reaction to medicines you have taken. You have repeated  (recurrent) headaches. You feel dizzy. You have swelling in your ankles. You have trouble with your vision. Get help right away if: You develop a severe headache or confusion. You have unusual weakness or numbness, or you feel faint. You have severe pain in your chest or abdomen. You vomit repeatedly. You have trouble breathing. These symptoms may be an emergency. Get help right away. Call 911. Do not wait to see if the symptoms will go away. Do not drive yourself to the hospital. Summary Hypertension is when the force of blood pumping through your arteries is too strong. If this condition is not controlled, it may put you at risk for serious complications. Your personal target blood pressure may vary depending on your medical conditions, your age, and other factors. For most people, a normal blood pressure is less than 120/80. Hypertension is managed by lifestyle changes, medicines, or both. Lifestyle changes to help manage hypertension include losing weight, eating a healthy, low-sodium diet, exercising more, stopping smoking, and limiting alcohol. This information is not intended to replace advice given to you by your health care provider. Make sure you discuss any questions you have with your health care provider. Document Revised: 06/16/2021 Document Reviewed: 06/16/2021 Elsevier Patient Education  [place].

## 2022-06-26 ENCOUNTER — Telehealth: Payer: Self-pay | Admitting: Pharmacist

## 2022-06-26 NOTE — Telephone Encounter (Signed)
Will pursue Eliquis PAP if patient returns call and qualifies.

## 2022-06-26 NOTE — Telephone Encounter (Signed)
-----   Message from [place], [place] sent at 06/26/2022  3:05 PM EDT ----- Regarding: RE: Eliquis PAP I attempted several times to reach the patient. Message left to contact me if interested in applying for assistance with Eliquis  [place],CCMA  ----- Message ----- From: Charlton Haws, [facility] Sent: 06/22/2022  12:51 PM EDT To: Avel Sensor, CMA Subject: Eliquis PAP                                    Please call patient and explain Eliquis PAP - income less than $43,740 and OOP spend at least 3% of annual income (ie if income is $40,000, OOP spend has to be $1200 this year)  If he will qualify, start PAP forms please.

## 2022-07-17 ENCOUNTER — Other Ambulatory Visit: Payer: Self-pay | Admitting: Cardiovascular Disease

## 2022-07-24 DIAGNOSIS — D631 Anemia in chronic kidney disease: Secondary | ICD-10-CM | POA: Diagnosis not present

## 2022-07-24 DIAGNOSIS — E875 Hyperkalemia: Secondary | ICD-10-CM | POA: Diagnosis not present

## 2022-07-24 DIAGNOSIS — D472 Monoclonal gammopathy: Secondary | ICD-10-CM | POA: Diagnosis not present

## 2022-07-24 DIAGNOSIS — I129 Hypertensive chronic kidney disease with stage 1 through stage 4 chronic kidney disease, or unspecified chronic kidney disease: Secondary | ICD-10-CM | POA: Diagnosis not present

## 2022-07-24 DIAGNOSIS — I2510 Atherosclerotic heart disease of native coronary artery without angina pectoris: Secondary | ICD-10-CM | POA: Diagnosis not present

## 2022-07-24 DIAGNOSIS — N184 Chronic kidney disease, stage 4 (severe): Secondary | ICD-10-CM | POA: Diagnosis not present

## 2022-07-24 DIAGNOSIS — E1122 Type 2 diabetes mellitus with diabetic chronic kidney disease: Secondary | ICD-10-CM | POA: Diagnosis not present

## 2022-07-31 ENCOUNTER — Ambulatory Visit: Payer: Self-pay

## 2022-07-31 ENCOUNTER — Telehealth: Payer: Self-pay

## 2022-07-31 NOTE — Patient Outreach (Signed)
  Care Coordination   Follow Up Visit Note   07/31/2022 Name: Jake Gross MRN: [id number] DOB: 04/10/1941  Jake Gross is a 81 y.o. year old male who sees Cable, Alyson Locket, NP for primary care. I spoke with  [employer] by phone today.  What matters to the patients health and wellness today?  Patient returned call. He states his blood pressures are ranging from 140-150's/ 60-70's. He states he is checking his blood pressure approximately 3 times per week.  He states his weight is down to 206 lbs from 214 lb. He states he continues to adhere to a low salt diet. Informed patient that practice pharmacist, Charlene Brooke has attempted to contact him regarding medication assistance.      Goals Addressed             This Visit's Progress    Patient stated: decrease blood pressure       Advised patient to continue to monitor blood pressure at least 3 times per week and document. Advised to notify provider of frequent elevated blood pressures.  Advised patient to follow a low salt diet.  Patient advised to return call to practice pharmacist, Charlene Brooke regarding his request for Eliquis assistance.  Confirmed patient has contact phone number for primary care provider office.           SDOH assessments and interventions completed:  Yes     Care Coordination Interventions Activated:  Yes  Care Coordination Interventions:  Yes, provided   Follow up plan: Follow up call scheduled for 09/01/22 at 3 pm    Encounter Outcome:  Pt. Visit Completed   Quinn Plowman RN,BSN,CCM [place] [phone] direct line

## 2022-07-31 NOTE — Patient Outreach (Signed)
  Care Coordination   07/31/2022 Name: Jake Gross MRN: [id number] DOB: [birth year]   Care Coordination Outreach Attempts:  An unsuccessful telephone outreach was attempted for a scheduled appointment today. HIPAA compliant voice message left with call back phone number.   Follow Up Plan:  Additional outreach attempts will be made to offer the patient care coordination information and services.   Encounter Outcome:  No Answer  Care Coordination Interventions Activated:  No   Care Coordination Interventions:  No, not indicated    Quinn Plowman RN,BSN,CCM [place] [phone] direct line

## 2022-08-02 ENCOUNTER — Telehealth: Payer: Self-pay | Admitting: Nurse Practitioner

## 2022-08-02 NOTE — Telephone Encounter (Signed)
Eliquis forms placed in front office - patient documents. Need patient signature and RX cost documents.

## 2022-08-02 NOTE — Telephone Encounter (Signed)
See phone note from 9/11 - we were trying to reach him about Eliquis PAP eligibility. Please call him back.

## 2022-08-02 NOTE — Telephone Encounter (Signed)
Spoke with the patient and explained the process for assistance. He would like to proceed and will get the financial information from expenses with the pharmacy and bring to office in the next 2 days. Forms completed and uploaded for review   Charlene Brooke, CPP notified  Avel Sensor, [place]  [phone]

## 2022-08-02 NOTE — Telephone Encounter (Signed)
Pt called requesting a call back regarding info on medication discount . [phone]

## 2022-08-03 NOTE — Telephone Encounter (Signed)
Patient came in and signed the documents and left one form. Placed in your box.

## 2022-08-03 NOTE — Telephone Encounter (Signed)
Faxed completed application to [employer]

## 2022-08-09 ENCOUNTER — Telehealth: Payer: Self-pay

## 2022-08-09 NOTE — Telephone Encounter (Signed)
**Note De-Identified  Obfuscation** Jake Gross: [employer]  Outcome This request has been approved using information available on the patient's profile.  Coverage Start Date:07/10/2022;Coverage End Date:08/09/2023; Drug Eliquis '5MG'$  tablets ePA cloud logo Form [employer] Electronic PA Form [phone] NCPDP) Original Claim Info 75  I have notified [id number] [address](Ph: [phone] of this approval.

## 2022-08-16 DIAGNOSIS — N184 Chronic kidney disease, stage 4 (severe): Secondary | ICD-10-CM | POA: Diagnosis not present

## 2022-09-01 ENCOUNTER — Telehealth: Payer: Self-pay

## 2022-09-01 ENCOUNTER — Ambulatory Visit: Payer: Self-pay

## 2022-09-01 NOTE — Progress Notes (Cosign Needed)
Patient contacted me with news of getting a letter today from [employer]. This letter was a denial for the assistance with Eliquis '5mg'$   BID. The letter states the patient was above the household income required for assistance.The patient has enough medication to get him through end of next week. Cost will be $264 for 30ds. Informed the patient I would pass along the message.  Charlene Brooke, CPP notified  Avel Sensor, [place]  [phone]

## 2022-09-01 NOTE — Patient Outreach (Signed)
  Care Coordination   09/01/2022 Name: Jake Gross MRN: [id number] DOB: Dec 31, 1940   Care Coordination Outreach Attempts:  An unsuccessful telephone outreach was attempted for a scheduled appointment today.  Unable to reach patient. HIPAA compliant voice message left with call back phone number and return call request.   Follow Up Plan:  Additional outreach attempts will be made to offer the patient care coordination information and services.   Encounter Outcome:  No Answer  Care Coordination Interventions Activated:  No   Care Coordination Interventions:  No, not indicated    Quinn Plowman RN,BSN,CCM [place] [phone] direct line

## 2022-09-02 ENCOUNTER — Other Ambulatory Visit: Payer: Self-pay | Admitting: Cardiovascular Disease

## 2022-09-04 NOTE — Telephone Encounter (Signed)
Spoke with patient; he is not interested in pursing anything with [employer]; he will keep paying the OOP cost for Eliquis.  Charlene Brooke, CPP notified  Marijean Niemann, [place] Clinical Pharmacy Assistant [phone]

## 2022-09-04 NOTE — Telephone Encounter (Addendum)
Unfortunately there is no other assistance available for Eliquis if he does not qualify for the [employer] program. If the donut hole is the reason for high cost Eliquis at his time, the good news is his insurance will reset in January and cost may go down.   Otherwise he may consider looking into other insurance plans. He may want to connect with [facility] representative to review his medications and see if there is a better plan for him - open enrollment ends Dec 7th.  SHIIP: [phone]

## 2022-09-15 ENCOUNTER — Ambulatory Visit: Payer: Self-pay

## 2022-09-15 NOTE — Patient Outreach (Signed)
  Care Coordination   Follow Up Visit Note   09/15/2022 Name: Jake Gross MRN: [id number] DOB: 02-25-1941  Jake Gross is a 81 y.o. year old male who sees Cable, Alyson Locket, NP for primary care. I spoke with  [employer] by phone today.  What matters to the patients health and wellness today?  Managing blood pressure.     Goals Addressed             This Visit's Progress    Patient stated: decrease blood pressure       Care Coordination Interventions: Evaluation of current treatment plan related to hypertension and patient's adherence to plan as established by provider Advised patient to continue to adhere to low salt diet.  Patient confirmed he had follow up with practice pharmacist regarding medication assistance.  He states he was unable to get assistance for his Eliquis.  He states he will continue to pay for Eliquis at this time.  Advised to continue monitoring blood pressure at least 3 x per week. Discussed importance of following a low salt diet.  Reviewed scheduled/ upcoming  provider visits             SDOH assessments and interventions completed:  No     Care Coordination Interventions:  Yes, provided   Follow up plan: Follow up call scheduled for 11/02/21    Encounter Outcome:  Pt. Visit Completed   Quinn Plowman RN,BSN,CCM [place] [phone] direct line

## 2022-09-18 ENCOUNTER — Other Ambulatory Visit: Payer: Self-pay | Admitting: Cardiovascular Disease

## 2022-09-27 ENCOUNTER — Emergency Department (HOSPITAL_COMMUNITY): Payer: Medicare Other

## 2022-09-27 ENCOUNTER — Inpatient Hospital Stay (HOSPITAL_COMMUNITY)
Admission: EM | Admit: 2022-09-27 | Discharge: 2022-10-03 | DRG: 683 | Disposition: A | Discharge status: Skilled Nursing Facility | Payer: Medicare Other | Attending: Internal Medicine | Admitting: Internal Medicine

## 2022-09-27 ENCOUNTER — Other Ambulatory Visit: Payer: Self-pay

## 2022-09-27 DIAGNOSIS — N400 Enlarged prostate without lower urinary tract symptoms: Secondary | ICD-10-CM | POA: Diagnosis not present

## 2022-09-27 DIAGNOSIS — R41841 Cognitive communication deficit: Secondary | ICD-10-CM | POA: Diagnosis not present

## 2022-09-27 DIAGNOSIS — I130 Hypertensive heart and chronic kidney disease with heart failure and stage 1 through stage 4 chronic kidney disease, or unspecified chronic kidney disease: Secondary | ICD-10-CM | POA: Diagnosis present

## 2022-09-27 DIAGNOSIS — I480 Paroxysmal atrial fibrillation: Secondary | ICD-10-CM | POA: Diagnosis not present

## 2022-09-27 DIAGNOSIS — E861 Hypovolemia: Secondary | ICD-10-CM | POA: Diagnosis present

## 2022-09-27 DIAGNOSIS — K8020 Calculus of gallbladder without cholecystitis without obstruction: Secondary | ICD-10-CM | POA: Diagnosis not present

## 2022-09-27 DIAGNOSIS — E1122 Type 2 diabetes mellitus with diabetic chronic kidney disease: Secondary | ICD-10-CM | POA: Diagnosis present

## 2022-09-27 DIAGNOSIS — S199XXA Unspecified injury of neck, initial encounter: Secondary | ICD-10-CM | POA: Diagnosis not present

## 2022-09-27 DIAGNOSIS — I5032 Chronic diastolic (congestive) heart failure: Secondary | ICD-10-CM | POA: Diagnosis present

## 2022-09-27 DIAGNOSIS — Z951 Presence of aortocoronary bypass graft: Secondary | ICD-10-CM

## 2022-09-27 DIAGNOSIS — G319 Degenerative disease of nervous system, unspecified: Secondary | ICD-10-CM | POA: Diagnosis not present

## 2022-09-27 DIAGNOSIS — W19XXXA Unspecified fall, initial encounter: Secondary | ICD-10-CM

## 2022-09-27 DIAGNOSIS — E785 Hyperlipidemia, unspecified: Secondary | ICD-10-CM | POA: Diagnosis present

## 2022-09-27 DIAGNOSIS — R296 Repeated falls: Secondary | ICD-10-CM | POA: Diagnosis present

## 2022-09-27 DIAGNOSIS — Z8673 Personal history of transient ischemic attack (TIA), and cerebral infarction without residual deficits: Secondary | ICD-10-CM | POA: Diagnosis not present

## 2022-09-27 DIAGNOSIS — N184 Chronic kidney disease, stage 4 (severe): Secondary | ICD-10-CM | POA: Diagnosis not present

## 2022-09-27 DIAGNOSIS — R2689 Other abnormalities of gait and mobility: Secondary | ICD-10-CM | POA: Diagnosis not present

## 2022-09-27 DIAGNOSIS — S0990XA Unspecified injury of head, initial encounter: Secondary | ICD-10-CM

## 2022-09-27 DIAGNOSIS — I5030 Unspecified diastolic (congestive) heart failure: Secondary | ICD-10-CM | POA: Diagnosis present

## 2022-09-27 DIAGNOSIS — I2510 Atherosclerotic heart disease of native coronary artery without angina pectoris: Secondary | ICD-10-CM | POA: Diagnosis present

## 2022-09-27 DIAGNOSIS — Z23 Encounter for immunization: Secondary | ICD-10-CM | POA: Diagnosis not present

## 2022-09-27 DIAGNOSIS — E872 Acidosis, unspecified: Secondary | ICD-10-CM

## 2022-09-27 DIAGNOSIS — E119 Type 2 diabetes mellitus without complications: Secondary | ICD-10-CM

## 2022-09-27 DIAGNOSIS — S3993XA Unspecified injury of pelvis, initial encounter: Secondary | ICD-10-CM | POA: Diagnosis not present

## 2022-09-27 DIAGNOSIS — Z794 Long term (current) use of insulin: Secondary | ICD-10-CM

## 2022-09-27 DIAGNOSIS — S0081XA Abrasion of other part of head, initial encounter: Secondary | ICD-10-CM | POA: Diagnosis present

## 2022-09-27 DIAGNOSIS — E876 Hypokalemia: Secondary | ICD-10-CM | POA: Diagnosis present

## 2022-09-27 DIAGNOSIS — Z7401 Bed confinement status: Secondary | ICD-10-CM | POA: Diagnosis not present

## 2022-09-27 DIAGNOSIS — Z87891 Personal history of nicotine dependence: Secondary | ICD-10-CM

## 2022-09-27 DIAGNOSIS — R410 Disorientation, unspecified: Secondary | ICD-10-CM | POA: Diagnosis not present

## 2022-09-27 DIAGNOSIS — S299XXA Unspecified injury of thorax, initial encounter: Secondary | ICD-10-CM | POA: Diagnosis not present

## 2022-09-27 DIAGNOSIS — D472 Monoclonal gammopathy: Secondary | ICD-10-CM | POA: Diagnosis present

## 2022-09-27 DIAGNOSIS — R001 Bradycardia, unspecified: Secondary | ICD-10-CM | POA: Diagnosis present

## 2022-09-27 DIAGNOSIS — R55 Syncope and collapse: Secondary | ICD-10-CM

## 2022-09-27 DIAGNOSIS — I10 Essential (primary) hypertension: Secondary | ICD-10-CM | POA: Diagnosis not present

## 2022-09-27 DIAGNOSIS — M6281 Muscle weakness (generalized): Secondary | ICD-10-CM | POA: Diagnosis not present

## 2022-09-27 DIAGNOSIS — E1165 Type 2 diabetes mellitus with hyperglycemia: Secondary | ICD-10-CM | POA: Diagnosis present

## 2022-09-27 DIAGNOSIS — G9389 Other specified disorders of brain: Secondary | ICD-10-CM | POA: Diagnosis not present

## 2022-09-27 DIAGNOSIS — I700 Atherosclerosis of aorta: Secondary | ICD-10-CM | POA: Diagnosis not present

## 2022-09-27 DIAGNOSIS — K7460 Unspecified cirrhosis of liver: Secondary | ICD-10-CM | POA: Diagnosis present

## 2022-09-27 DIAGNOSIS — Z7901 Long term (current) use of anticoagulants: Secondary | ICD-10-CM | POA: Diagnosis not present

## 2022-09-27 DIAGNOSIS — M50323 Other cervical disc degeneration at C6-C7 level: Secondary | ICD-10-CM | POA: Diagnosis not present

## 2022-09-27 DIAGNOSIS — K5730 Diverticulosis of large intestine without perforation or abscess without bleeding: Secondary | ICD-10-CM | POA: Diagnosis not present

## 2022-09-27 DIAGNOSIS — M5031 Other cervical disc degeneration,  high cervical region: Secondary | ICD-10-CM | POA: Diagnosis not present

## 2022-09-27 DIAGNOSIS — Z79899 Other long term (current) drug therapy: Secondary | ICD-10-CM

## 2022-09-27 DIAGNOSIS — R2681 Unsteadiness on feet: Secondary | ICD-10-CM | POA: Diagnosis not present

## 2022-09-27 DIAGNOSIS — Z833 Family history of diabetes mellitus: Secondary | ICD-10-CM

## 2022-09-27 DIAGNOSIS — M47814 Spondylosis without myelopathy or radiculopathy, thoracic region: Secondary | ICD-10-CM | POA: Diagnosis not present

## 2022-09-27 DIAGNOSIS — E8729 Other acidosis: Secondary | ICD-10-CM

## 2022-09-27 DIAGNOSIS — R739 Hyperglycemia, unspecified: Secondary | ICD-10-CM | POA: Diagnosis not present

## 2022-09-27 DIAGNOSIS — R0600 Dyspnea, unspecified: Secondary | ICD-10-CM | POA: Diagnosis not present

## 2022-09-27 DIAGNOSIS — Z8249 Family history of ischemic heart disease and other diseases of the circulatory system: Secondary | ICD-10-CM

## 2022-09-27 DIAGNOSIS — N179 Acute kidney failure, unspecified: Secondary | ICD-10-CM | POA: Diagnosis not present

## 2022-09-27 DIAGNOSIS — J439 Emphysema, unspecified: Secondary | ICD-10-CM | POA: Diagnosis not present

## 2022-09-27 DIAGNOSIS — R278 Other lack of coordination: Secondary | ICD-10-CM | POA: Diagnosis not present

## 2022-09-27 DIAGNOSIS — R000 Tachycardia, unspecified: Secondary | ICD-10-CM | POA: Diagnosis not present

## 2022-09-27 LAB — CBC
HCT: 45.6 % (ref 39.0–52.0)
Hemoglobin: 15.2 g/dL (ref 13.0–17.0)
MCH: 25.9 pg — ABNORMAL LOW (ref 26.0–34.0)
MCHC: 33.3 g/dL (ref 30.0–36.0)
MCV: 77.8 fL — ABNORMAL LOW (ref 80.0–100.0)
Platelets: 265 10*3/uL (ref 150–400)
RBC: 5.86 MIL/uL — ABNORMAL HIGH (ref 4.22–5.81)
RDW: 15.4 % (ref 11.5–15.5)
WBC: 9.8 10*3/uL (ref 4.0–10.5)
nRBC: 0 % (ref 0.0–0.2)

## 2022-09-27 LAB — I-STAT VENOUS BLOOD GAS, ED
Acid-Base Excess: 2 mmol/L (ref 0.0–2.0)
Bicarbonate: 21.5 mmol/L (ref 20.0–28.0)
Calcium, Ion: 0.97 mmol/L — ABNORMAL LOW (ref 1.15–1.40)
HCT: 46 % (ref 39.0–52.0)
Hemoglobin: 15.6 g/dL (ref 13.0–17.0)
O2 Saturation: 53 %
Potassium: 4.5 mmol/L (ref 3.5–5.1)
Sodium: 135 mmol/L (ref 135–145)
TCO2: 22 mmol/L (ref 22–32)
pCO2, Ven: 22.9 mmHg — ABNORMAL LOW (ref 44–60)
pH, Ven: 7.58 — ABNORMAL HIGH (ref 7.25–7.43)
pO2, Ven: 23 mmHg — CL (ref 32–45)

## 2022-09-27 LAB — I-STAT CHEM 8, ED
BUN: 97 mg/dL — ABNORMAL HIGH (ref 8–23)
Calcium, Ion: 0.96 mmol/L — ABNORMAL LOW (ref 1.15–1.40)
Chloride: 101 mmol/L (ref 98–111)
Creatinine, Ser: 4.2 mg/dL — ABNORMAL HIGH (ref 0.61–1.24)
Glucose, Bld: 291 mg/dL — ABNORMAL HIGH (ref 70–99)
HCT: 48 % (ref 39.0–52.0)
Hemoglobin: 16.3 g/dL (ref 13.0–17.0)
Potassium: 4.5 mmol/L (ref 3.5–5.1)
Sodium: 136 mmol/L (ref 135–145)
TCO2: 20 mmol/L — ABNORMAL LOW (ref 22–32)

## 2022-09-27 LAB — COMPREHENSIVE METABOLIC PANEL
ALT: 28 U/L (ref 0–44)
AST: 44 U/L — ABNORMAL HIGH (ref 15–41)
Albumin: 3.7 g/dL (ref 3.5–5.0)
Alkaline Phosphatase: 75 U/L (ref 38–126)
Anion gap: 21 — ABNORMAL HIGH (ref 5–15)
BUN: 100 mg/dL — ABNORMAL HIGH (ref 8–23)
CO2: 20 mmol/L — ABNORMAL LOW (ref 22–32)
Calcium: 9.3 mg/dL (ref 8.9–10.3)
Chloride: 96 mmol/L — ABNORMAL LOW (ref 98–111)
Creatinine, Ser: 3.99 mg/dL — ABNORMAL HIGH (ref 0.61–1.24)
GFR, Estimated: 15 mL/min — ABNORMAL LOW (ref >60)
Glucose, Bld: 286 mg/dL — ABNORMAL HIGH (ref 70–99)
Potassium: 4.3 mmol/L (ref 3.5–5.1)
Sodium: 137 mmol/L (ref 135–145)
Total Bilirubin: 1.7 mg/dL — ABNORMAL HIGH (ref 0.3–1.2)
Total Protein: 7.9 g/dL (ref 6.5–8.1)

## 2022-09-27 LAB — BRAIN NATRIURETIC PEPTIDE: B Natriuretic Peptide: 195.8 pg/mL — ABNORMAL HIGH (ref 0.0–100.0)

## 2022-09-27 LAB — LACTIC ACID, PLASMA: Lactic Acid, Venous: 4 mmol/L (ref 0.5–1.9)

## 2022-09-27 LAB — SAMPLE TO BLOOD BANK

## 2022-09-27 LAB — PROTIME-INR
INR: 1.2 (ref 0.8–1.2)
Prothrombin Time: 15.2 seconds (ref 11.4–15.2)

## 2022-09-27 LAB — TROPONIN I (HIGH SENSITIVITY): Troponin I (High Sensitivity): 46 ng/L — ABNORMAL HIGH (ref <18)

## 2022-09-27 LAB — CBG MONITORING, ED: Glucose-Capillary: 292 mg/dL — ABNORMAL HIGH (ref 70–99)

## 2022-09-27 LAB — ETHANOL: Alcohol, Ethyl (B): <10 mg/dL (ref <10)

## 2022-09-27 MED ORDER — SODIUM CHLORIDE 0.9 % IV BOLUS
1000.0000 mL | Freq: Once | INTRAVENOUS | Status: AC
  Administered 2022-09-27: 1000 mL via INTRAVENOUS

## 2022-09-27 MED ORDER — MAGNESIUM SULFATE 2 GM/50ML IV SOLN
2.0000 g | Freq: Once | INTRAVENOUS | Status: AC
  Administered 2022-09-28: 2 g via INTRAVENOUS
  Filled 2022-09-27: qty 50

## 2022-09-27 MED ORDER — IOHEXOL 350 MG/ML SOLN
75.0000 mL | Freq: Once | INTRAVENOUS | Status: AC | PRN
  Administered 2022-09-27: 75 mL via INTRAVENOUS

## 2022-09-27 NOTE — Progress Notes (Signed)
Orthopedic Tech Progress Note Patient Details:  [name] 02/14/1941 [id number]  Patient ID: Jake Gross, male   DOB: May 13, 1941, 81 y.o.   MRN: [id number] Level II; not currently needed. Vernona Rieger 09/27/2022, 9:57 PM

## 2022-09-27 NOTE — ED Notes (Signed)
Pt returned to room post CT. Pt oxygen saturations maintained WNL throughout CT on room air, but pt continues to endorse feeling short of breath. Pt placed on 2L [place] for comfort at this time. Provided more warm blankets.

## 2022-09-27 NOTE — ED Notes (Signed)
Pt arrived via [facility] for multiple ground level falls (on eliquis) from home. Pt found to be significantly short of breath, scabbed abrasion on forehead and left eye brow. Pt reported multiple falls today with weakness and believes he may have passed out each time. Placed on 2L [place] for comfort. 20 LAC.  PTA EMS Vitals  BP 146/76 HR 52 SPO2 100% 2L [place] CBG 287

## 2022-09-27 NOTE — ED Provider Notes (Signed)
  Provider Note MRN:  [id number]  Arrival date & time: 09/28/22    ED Course and Medical Decision Making  Assumed care from Dr. Armandina Gemma at shift change.  Dyspnea past few days causing multiple falls.  Had a syncopal episode today.  2:20 AM update: On my evaluation patient is sitting comfortably.  He is demonstrating some ectopy, namely some PACs and PVCs that are falling quite close to the T wave and so he is intermittently having a prolonged QT.  His labs are revealing significant worsening of his kidney function with BUN of 100, lactate of 4, creatinine elevation up to 4.  Providing fluids, magnesium.  Admitting to medicine for further care.  His trauma workup is reassuring.  .Critical Care  Performed by: Maudie Flakes, MD Authorized by: Maudie Flakes, MD   Critical care provider statement:    Critical care time (minutes):  35   Critical care was necessary to treat or prevent imminent or life-threatening deterioration of the following conditions:  Renal failure   Critical care was time spent personally by me on the following activities:  Development of treatment plan with patient or surrogate, discussions with consultants, evaluation of patient's response to treatment, examination of patient, ordering and review of laboratory studies, ordering and review of radiographic studies, ordering and performing treatments and interventions, pulse oximetry, re-evaluation of patient's condition and review of old charts   Final Clinical Impressions(s) / ED Diagnoses     ICD-10-CM   1. Dyspnea, unspecified type  R06.00     2. Syncope, unspecified syncope type  R55     3. Injury of head, initial encounter  S09.90XA     4. Fall, initial encounter  W19.XXXA     5. AKI (acute kidney injury) (HCC)  N17.9     6. Lactic acidosis  E87.20     7. Increased anion gap metabolic acidosis  T90.30       ED Discharge Orders     None       Discharge Instructions   None     Barth Kirks. Sedonia Small,  [place] [email or web address]    Maudie Flakes, MD 09/28/22 [phone]

## 2022-09-27 NOTE — ED Notes (Signed)
Patient transported to CT with Thurmond Butts TRN

## 2022-09-27 NOTE — ED Provider Notes (Signed)
[facility] EMERGENCY DEPARTMENT Provider Note   CSN: [id number] Arrival date & time: 09/27/22  2156     History  Chief Complaint  Patient presents with   Fall   Shortness of Breath    [place] is a 81 y.o. male.  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  History is provided by EMS.  The patient has had multiple ground-level falls at home.  He states that he has had several days of worsening shortness of breath.  He was in the bathroom earlier today when he syncopized.  He sustained an abrasion to his forehead and left eyebrow.  He reports multiple falls today from generalized weakness.  He arrives to the emergency department GCS 14, ABC intact.  He complains of pain in his forehead and shortness of breath.  He denies any chest pain.  Home Medications Prior to Admission medications   Medication Sig Start Date End Date Taking? Authorizing Provider  amLODipine (NORVASC) 5 MG tablet Take 1 tablet (5 mg total) by mouth daily. Please call to schedule an overdue appointment with Dr. Angelena Form for refills, [phone], thank you. 2ND ATTEMPT 09/19/22  Yes Burnell Blanks, MD  chlorthalidone (HYGROTON) 25 MG tablet Take 25 mg by mouth daily.   Yes [provider]  ELIQUIS 5 MG TABS tablet TAKE 1 TABLET BY MOUTH TWICE A DAY 02/20/22  Yes Burnell Blanks, MD  hydrALAZINE (APRESOLINE) 100 MG tablet Take 1 tablet (100 mg total) by mouth 3 (three) times daily. Please call to schedule an overdue appointment with Dr. Angelena Form for refills, [phone], thank you. 1st attempt. 09/19/22  Yes Burnell Blanks, MD  insulin glargine-yfgn (SEMGLEE, YFGN,) 100 UNIT/ML Pen Inject 60  Units into the skin every morning. 07/18/21  Yes Renato Shin, MD  isosorbide mononitrate (IMDUR) 60 MG 24 hr tablet Take 1 tablet (60 mg total) by mouth daily. Please call to schedule an overdue appointment with Dr. Angelena Form for refills, [phone], thank you. 1st attempt. 09/04/22  Yes Burnell Blanks, MD  metoprolol tartrate (LOPRESSOR) 25 MG tablet Take 1.5 tablets (37.5 mg total) by mouth 2 (two) times daily. Please call to schedule an overdue appointment with Dr. Angelena Form for refills, [phone], thank you. 1st attempt. 09/19/22  Yes Burnell Blanks, MD  tamsulosin (FLOMAX) 0.4 MG CAPS capsule TAKE 1 CAPSULE BY MOUTH EVERY DAY 12/01/19  Yes Baity, Coralie Keens, NP  torsemide (DEMADEX) 20 MG tablet Take 1 tablet (20 mg total) by mouth 2 (two) times daily. Pt needs to make appt with provider for further refills - 1st attempt 07/18/22  Yes Burnell Blanks, MD  Insulin Pen Needle (BD PEN NEEDLE NANO 2ND GEN) 32G X 4 MM MISC Use with insulin pen as directed 01/19/22   Michela Pitcher, NP  Lancets [facility] ULTRASOFT) lancets Use as instructed 04/27/21   Renato Shin, MD  rosuvastatin (CRESTOR) 20 MG tablet TAKE 1 TABLET BY MOUTH AT BEDTIME. SCHEDULE PHYSICAL EXAM 09/02/21   Kennyth Arnold, FNP      Allergies    Patient has no known allergies.    Review of Systems   Review of Systems  Unable to perform ROS: Acuity of condition    Physical Exam  Updated Vital Signs BP 117/74   Pulse (!) 53   Temp (!) 97 F (36.1 C)   Resp 20   Ht '5\' 7"'$  (1.702 m)   Wt 94 kg   SpO2 98%   BMI 32.46 kg/m  Physical Exam Vitals and nursing note reviewed.  Constitutional:      Appearance: He is well-developed.     Comments: GCS 14, ABC intact  HENT:     Head: Normocephalic.     Comments: Abrasion to the forehead, hemostatic Eyes:     Conjunctiva/sclera: Conjunctivae normal.  Neck:     Comments: No midline tenderness to palpation of the cervical spine. ROM intact. Cardiovascular:      Rate and Rhythm: Regular rhythm. Bradycardia present.  Pulmonary:     Effort: Pulmonary effort is normal. No respiratory distress.     Breath sounds: Normal breath sounds.  Chest:     Comments: Chest wall stable and non-tender to AP and lateral compression. Clavicles stable and non-tender to AP compression Abdominal:     Palpations: Abdomen is soft.     Tenderness: There is no abdominal tenderness.     Comments: Pelvis stable to lateral compression.  Musculoskeletal:     Cervical back: Neck supple.     Comments: No midline tenderness to palpation of the thoracic or lumbar spine. Extremities atraumatic with intact ROM.   Skin:    General: Skin is warm and dry.  Neurological:     Mental Status: He is alert.     Comments: CN II-XII grossly intact. Moving all four extremities spontaneously and sensation grossly intact.     ED Results / Procedures / Treatments   Labs (all labs ordered are listed, but only abnormal results are displayed) Labs Reviewed  COMPREHENSIVE METABOLIC PANEL - Abnormal; Notable for the following components:      Result Value   Chloride 96 (*)    CO2 20 (*)    Glucose, Bld 286 (*)    BUN 100 (*)    Creatinine, Ser 3.99 (*)    AST 44 (*)    Total Bilirubin 1.7 (*)    GFR, Estimated 15 (*)    Anion gap 21 (*)    All other components within normal limits  CBC - Abnormal; Notable for the following components:   RBC 5.86 (*)    MCV 77.8 (*)    MCH 25.9 (*)    All other components within normal limits  LACTIC ACID, PLASMA - Abnormal; Notable for the following components:   Lactic Acid, Venous 4.0 (*)    All other components within normal limits  BRAIN NATRIURETIC PEPTIDE - Abnormal; Notable for the following components:   B Natriuretic Peptide 195.8 (*)    All other components within normal limits  CBG MONITORING, ED - Abnormal; Notable for the following components:   Glucose-Capillary 292 (*)    All other components within normal limits  I-STAT CHEM 8,  ED - Abnormal; Notable for the following components:   BUN 97 (*)    Creatinine, Ser 4.20 (*)    Glucose, Bld 291 (*)    Calcium, Ion 0.96 (*)    TCO2 20 (*)    All other components within normal limits  I-STAT VENOUS BLOOD GAS, ED - Abnormal; Notable for the following components:   pH, Ven 7.580 (*)    pCO2, Ven 22.9 (*)    pO2, Ven 23 (*)    Calcium, Ion 0.97 (*)    All other  components within normal limits  TROPONIN I (HIGH SENSITIVITY) - Abnormal; Notable for the following components:   Troponin I (High Sensitivity) 46 (*)    All other components within normal limits  ETHANOL  PROTIME-INR  URINALYSIS, ROUTINE W REFLEX MICROSCOPIC  SAMPLE TO BLOOD BANK  TROPONIN I (HIGH SENSITIVITY)    EKG EKG Interpretation  Date/Time:  Wednesday September 27 2022 23:21:35 EST Ventricular Rate:  53 PR Interval:  196 QRS Duration: 127 QT Interval:  548 QTC Calculation: 515 R Axis:   12 Text Interpretation: Sinus rhythm Atrial premature complex Left bundle branch block Confirmed by Gerlene Fee [phone]) on 09/27/2022 11:47:19 PM  Radiology CT CERVICAL SPINE WO CONTRAST  Result Date: 09/27/2022 CLINICAL DATA:  Polytrauma, blunt Multiple falls. EXAM: CT CERVICAL SPINE WITHOUT CONTRAST TECHNIQUE: Multidetector CT imaging of the cervical spine was performed without intravenous contrast. Multiplanar CT image reconstructions were also generated. RADIATION DOSE REDUCTION: This exam was performed according to the departmental dose-optimization program which includes automated exposure control, adjustment of the mA and/or kV according to patient size and/or use of iterative reconstruction technique. COMPARISON:  None Available. FINDINGS: Alignment: Normal. Skull base and vertebrae: No acute fracture. Vertebral body heights are maintained. The dens and skull base are intact. Soft tissues and spinal canal: No prevertebral fluid or swelling. No visible canal hematoma. Disc levels: Diffuse degenerative  disc disease from C3-C4 through C6-C7. C3-C4 on colo CIS may be degenerative or congenital. Upper chest: Assessed on concurrent chest CT, reported separately. Other: None. IMPRESSION: No acute fracture or subluxation. Multilevel degenerative disc disease. Electronically Signed   By: Keith Rake M.D.   On: 09/27/2022 23:08   CT HEAD WO CONTRAST  Result Date: 09/27/2022 CLINICAL DATA:  Head trauma, moderate-severe Multiple ground level falls, on anticoagulation. EXAM: CT HEAD WITHOUT CONTRAST TECHNIQUE: Contiguous axial images were obtained from the base of the skull through the vertex without intravenous contrast. RADIATION DOSE REDUCTION: This exam was performed according to the departmental dose-optimization program which includes automated exposure control, adjustment of the mA and/or kV according to patient size and/or use of iterative reconstruction technique. COMPARISON:  Head CT and brain MRI 06/30/2017 FINDINGS: Brain: No acute intracranial hemorrhage. No subdural or extra-axial collection. Moderate generalized atrophy. Left temporoparietal encephalomalacia, remote but new from 2018. No hydrocephalus. No midline shift or mass effect. Vascular: Atherosclerosis of skullbase vasculature without hyperdense vessel or abnormal calcification. Skull: No fracture or focal lesion. Sinuses/Orbits: No acute findings. Other: Mild left frontal scalp edema. IMPRESSION: 1. Mild left frontal scalp edema. No acute intracranial abnormality. No skull fracture. 2. Moderate atrophy. Left temporoparietal encephalomalacia, likely remote ischemia, however new from 2018. Electronically Signed   By: Keith Rake M.D.   On: 09/27/2022 23:03   CT CHEST ABDOMEN PELVIS W CONTRAST  Result Date: 09/27/2022 CLINICAL DATA:  Multiple recent falls while on blood thinners, initial encounter EXAM: CT CHEST, ABDOMEN, AND PELVIS WITH CONTRAST TECHNIQUE: Multidetector CT imaging of the chest, abdomen and pelvis was performed  following the standard protocol during bolus administration of intravenous contrast. RADIATION DOSE REDUCTION: This exam was performed according to the departmental dose-optimization program which includes automated exposure control, adjustment of the mA and/or kV according to patient size and/or use of iterative reconstruction technique. CONTRAST:  73m OMNIPAQUE IOHEXOL 350 MG/ML SOLN COMPARISON:  08/27/2019 FINDINGS: CT CHEST FINDINGS Cardiovascular: Atherosclerotic calcifications of the thoracic aorta are noted. Coronary calcifications are seen as well as changes of prior coronary bypass grafting. No cardiac enlargement is seen.  The pulmonary artery shows no filling defect although not timed for true embolus evaluation. Mediastinum/Nodes: Thoracic inlet is within normal limits. No hilar or mediastinal adenopathy is noted. The esophagus as visualized is within normal limits. 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 No focal infiltrate or sizable effusion is noted. No sizable parenchymal nodule is seen. Musculoskeletal: Degenerative changes of the thoracic spine are noted. No acute rib abnormality is seen. CT ABDOMEN PELVIS FINDINGS Hepatobiliary: Fatty infiltration of the liver is noted. Mild nodularity of the liver is noted as well. No focal mass is seen. Gallstones are seen within the gallbladder without complicating factors. Pancreas: Unremarkable. No pancreatic ductal dilatation or surrounding inflammatory changes. Spleen: Normal in size without focal abnormality. 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 This measures approximately 11 mm. The kidneys show no renal calculi or obstructive changes. Normal enhancement is noted bilaterally. The bladder is well distended. Stomach/Bowel: Scattered diverticular change of the colon is noted without evidence of diverticulitis. No obstructive changes are seen. The appendix is within normal limits. Small  bowel and stomach are unremarkable. Vascular/Lymphatic: Aortic atherosclerosis. No enlarged abdominal or pelvic lymph nodes. Reproductive: Prostate is enlarged. Other: No abdominal wall hernia or abnormality. No abdominopelvic ascites. Musculoskeletal: Degenerative changes of lumbar spine are seen. IMPRESSION: CT of the chest: No acute intrathoracic abnormality is noted. 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 No follow-up imaging is recommended. JACR 2017 Aug; 14(8):1038-44, JCAT 2016 Mar-Apr; 40(2):194-200, Urol J 2006 Spring; 3(2):71-4. Diverticulosis without diverticulitis. Cholelithiasis without complicating factors. No acute abnormality to correspond with the given clinical history. Electronically Signed   By: Inez Catalina M.D.   On: 09/27/2022 23:01   DG Pelvis Portable  Result Date: 09/27/2022 CLINICAL DATA:  Trauma, fall. EXAM: PORTABLE PELVIS 1-2 VIEWS COMPARISON:  None Available. FINDINGS: The cortical margins of the bony pelvis are intact. No fracture. Pubic symphysis and sacroiliac joints are congruent. Both femoral heads are well-seated in the respective acetabula. IMPRESSION: No pelvic fracture. Electronically Signed   By: Keith Rake M.D.   On: 09/27/2022 22:30   DG Chest Port 1 View  Result Date: 09/27/2022 CLINICAL DATA:  Trauma, fall. EXAM: PORTABLE CHEST 1 VIEW COMPARISON:  Radiograph 08/18/2021 FINDINGS: Prior median sternotomy and CABG.The cardiomediastinal contours are normal. The lungs are clear. Pulmonary vasculature is normal. No consolidation, pleural effusion, or pneumothorax. No acute osseous abnormalities are seen. Left chest wall loop recorder. IMPRESSION: No acute chest findings or evidence of traumatic injury. Electronically Signed   By: Keith Rake M.D.   On: 09/27/2022 22:29    Procedures Procedures    Medications Ordered in ED Medications  magnesium sulfate IVPB 2 g  50 mL (has no administration in time range)  iohexol (OMNIPAQUE) 350 MG/ML injection 75 mL (75 mLs Intravenous Contrast Given 09/27/22 2243)  sodium chloride 0.9 % bolus 1,000 mL (1,000 mLs Intravenous New Bag/Given 09/27/22 2351)    ED Course/ Medical Decision Making/ A&P                           Medical Decision Making Amount and/or Complexity of Data Reviewed Labs: ordered. Radiology: ordered. ECG/medicine tests: ordered.  Risk Prescription drug management.    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  History is provided by EMS.  The patient has had multiple ground-level falls at home.  He states that he has had several days of worsening shortness of breath.  He was in the bathroom earlier today when he syncopized.  He sustained an abrasion to his forehead and left eyebrow.  He reports multiple falls today from generalized weakness.  He arrives to the emergency department GCS 14, ABC intact.  He complains of pain in his forehead and shortness of breath.  He denies any chest pain.  Patient CBG on arrival was 292.  Initial CBC without a leukocytosis or anemia.  An i-STAT Chem-8 revealed an AKI on CKD with a creatinine of 4.2.  No significant electrolyte abnormality.  Trauma imaging revealed (full reports in EMR): Portable CXR:  No evidence of pneumothorax or tracheal deviation Portable Pelvis:  No evidence of acute hip fracture or malalignment CT scans (pan-scan):   CT Head: IMPRESSION:  1. Mild left frontal scalp edema. No acute intracranial abnormality.  No skull fracture.  2. Moderate atrophy. Left temporoparietal encephalomalacia, likely  remote ischemia, however new from 2018.    CT Cervical Spine: IMPRESSION:   No acute fracture or subluxation. Multilevel degenerative disc  disease.    CT C/A/P: IMPRESSION:  CT of the chest: No acute intrathoracic abnormality is noted.    CT of the abdomen and pelvis: Mild changes of hepatic cirrhosis.    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 No  follow-up imaging is recommended.    JACR 2017 Aug; 14(8):1038-44, JCAT 2016 Mar-Apr; 40(2):194-200, Urol  J 2006 Spring; 3(2):71-4.    Diverticulosis without diverticulitis.    Cholelithiasis without complicating factors.    No acute abnormality to correspond with the given clinical history.    The patient had significant laboratory abnormalities, lactic acidosis to 4.0, evidence of an AKI on CKD 8 with a creatinine of 3.99, hyperglycemia with concern for developing DKA with a bicarbonate of 20, anion gap elevation to 21 which could be due to the patient's lactic acidosis.  Initial troponin was elevated to 46.  BNP was elevated to 196.  An INR was normal.  A VBG did not reveal an acidosis, instead revealed a respiratory alkalosis with a pH of 7.58, pCO2 of 23.  Plan at time of signout to reassess the patient, once cleared from a traumatic standpoint, evaluate medically for admission.  No evidence of traumatic injury identified on CT imaging.  The patient has an AKI and hyperglycemia with an anion gap acidosis.  Signout given to Dr. Sedonia Small to follow-up and reassess the patient, plan for likely medical admission.   Final Clinical Impression(s) / ED Diagnoses Final diagnoses:  Dyspnea, unspecified type  Syncope, unspecified syncope type  Injury of head, initial encounter  Fall, initial encounter  AKI (acute kidney injury) ([place])  Lactic acidosis  Increased anion gap metabolic acidosis    Rx / DC Orders ED Discharge Orders     None         Regan Lemming, MD 09/27/22 2354

## 2022-09-27 NOTE — ED Notes (Signed)
Trauma Response Nurse Documentation   [place] is a 81 y.o. male arriving to [facility] ED via [facility]  On Eliquis (apixaban) daily. Trauma was activated as a Level 2 by Janett Billow, RN based on the following trauma criteria Elderly patients > 65 with head trauma on anti-coagulation (excluding ASA). Trauma team at the bedside on patient arrival.   Patient cleared for CT by Dr. Armandina Gemma. Pt transported to CT with trauma response nurse present to monitor. RN remained with the patient throughout their absence from the department for clinical observation.   GCS 15.  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   CAD (coronary artery disease) 09/29/2019   Cervical spondylosis    Chronic anticoagulation    Chronic diastolic CHF (congestive heart failure) ([place]) 12/17/2018   CKD (chronic kidney disease) 12/17/2018   Diabetes mellitus    History of GI bleed 09/29/2019   Hyperlipidemia    Hypertension    Iron deficiency anemia due to chronic blood loss    Paroxysmal atrial fibrillation ([place]) 12/17/2018   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 NO ISCHEMIA   COLONOSCOPY W/ POLYPECTOMY     5 polyps   COLONOSCOPY WITH PROPOFOL N/A 08/29/2019   Procedure: COLONOSCOPY WITH PROPOFOL;  Surgeon: Doran Stabler, MD;  Location: [place];  Service: Gastroenterology;  Laterality: N/A;   CORONARY ARTERY BYPASS GRAFT N/A 11/26/2018   Procedure: CORONARY ARTERY BYPASS GRAFTING (CABG), ON PUMP, TIMES FOUR, USING LEFT INTERNAL MAMMARY ARTERY  AND ENDOSCOPICALLY HARVESTED LEFT SAPHENOUS VEIN;  Surgeon: Gaye Pollack, MD;  Location: [place];  Service: Open Heart Surgery;  Laterality: N/A;   ESOPHAGOGASTRODUODENOSCOPY (EGD) WITH PROPOFOL N/A 08/29/2019   Procedure: ESOPHAGOGASTRODUODENOSCOPY (EGD) WITH PROPOFOL;  Surgeon: Doran Stabler, MD;  Location: [place];  Service: Gastroenterology;  Laterality: N/A;   EUS N/A 03/11/2015   Procedure: LOWER ENDOSCOPIC ULTRASOUND (EUS);  Surgeon: Milus Banister, MD;  Location: [facility];  Service: Endoscopy;  Laterality: N/A;   FLEXIBLE SIGMOIDOSCOPY N/A 02/02/2015   Procedure: FLEXIBLE SIGMOIDOSCOPY;  Surgeon: Inda Castle, MD;  Location: [facility];  Service: Endoscopy;  Laterality: N/A;  ERBE   HEMOSTASIS CLIP PLACEMENT  08/29/2019   Procedure: HEMOSTASIS CLIP PLACEMENT;  Surgeon: Doran Stabler, MD;  Location: [facility];  Service: Gastroenterology;;   HOT HEMOSTASIS N/A 08/29/2019   Procedure: HOT HEMOSTASIS (ARGON PLASMA COAGULATION/BICAP);  Surgeon: Doran Stabler, MD;  Location: [place];  Service: Gastroenterology;  Laterality: N/A;   LEFT HEART CATH AND CORONARY ANGIOGRAPHY N/A 11/20/2018   Procedure: LEFT HEART CATH AND CORONARY ANGIOGRAPHY;  Surgeon: Burnell Blanks, MD;  Location: [facility];  Service: Cardiovascular;  Laterality: N/A;   LOOP RECORDER INSERTION N/A 08/14/2017   Procedure: LOOP RECORDER INSERTION;  Surgeon: Thompson Grayer, MD;  Location: [facility];  Service: Cardiovascular;  Laterality: N/A;   PARTIAL PROCTECTOMY BY TEM N/A 04/08/2015   Procedure: TEM PARTIAL PROCTECTOMY OF RECTAL  MASS;  Surgeon: Michael Boston, MD;  Location: [facility];  Service: General;  Laterality: N/A;   POLYPECTOMY  08/29/2019   Procedure: POLYPECTOMY;  Surgeon: Doran Stabler, MD;  Location: [place];  Service: Gastroenterology;;   TEE WITHOUT CARDIOVERSION N/A 06/25/2017   Procedure: TRANSESOPHAGEAL ECHOCARDIOGRAM (TEE);  Surgeon: Skeet Latch, MD;  Location: [place];  Service: Cardiovascular;  Laterality: N/A;   TEE WITHOUT CARDIOVERSION N/A 11/26/2018   Procedure: TRANSESOPHAGEAL ECHOCARDIOGRAM (TEE);  Surgeon: Gaye Pollack, MD;  Location: [place];  Service: Open Heart Surgery;  Laterality: N/A;   TONSILLECTOMY AND ADENOIDECTOMY     as child       Initial Focused Assessment (If applicable, or please see trauma documentation): Airway-- intact, no obstruction Breathing-- spontaneous, labored initially, progressed to unlabored at rest.  CT's Completed:   CT Head, CT C-Spine, CT Chest w/ contrast, and CT abdomen/pelvis w/ contrast   Interventions:  See event note  Plan for disposition:  Unknown at this time   Consults completed:  none at 2320.  Event Summary: Patient brought in by [facility] from home, complaint of multiple falls today (3), patient on eliquis and struck his head. Patient reports he passed out and fell in the restroom, striking his head on the toilet. Patient is alert and oriented x4, GCS 15 upon arrival. Patient main complaint of shortness of breath x4 days. Patient also reports missing insulin dose for 4 days. Patient lung sounds clear on arrival, SpO2 100% room air. Patient answering questions appropriately, pupils bilateral 2 and sluggish. Patient only complaint of shortness of breath x4 days. Trauma labs obtained. Xray chest and pelvis completed. CT head, c-spine, chest/abdomen/pelvis completed.   Bedside handoff with ED RN [place].    Trudee Kuster  Trauma Response RN  Please call TRN at [phone] for further assistance.

## 2022-09-28 ENCOUNTER — Encounter (HOSPITAL_COMMUNITY): Payer: Self-pay | Admitting: Family Medicine

## 2022-09-28 DIAGNOSIS — M6281 Muscle weakness (generalized): Secondary | ICD-10-CM | POA: Diagnosis not present

## 2022-09-28 DIAGNOSIS — R7989 Other specified abnormal findings of blood chemistry: Secondary | ICD-10-CM | POA: Insufficient documentation

## 2022-09-28 DIAGNOSIS — K7460 Unspecified cirrhosis of liver: Secondary | ICD-10-CM | POA: Diagnosis present

## 2022-09-28 DIAGNOSIS — Z8673 Personal history of transient ischemic attack (TIA), and cerebral infarction without residual deficits: Secondary | ICD-10-CM | POA: Diagnosis not present

## 2022-09-28 DIAGNOSIS — I5032 Chronic diastolic (congestive) heart failure: Secondary | ICD-10-CM

## 2022-09-28 DIAGNOSIS — S0081XA Abrasion of other part of head, initial encounter: Secondary | ICD-10-CM | POA: Diagnosis present

## 2022-09-28 DIAGNOSIS — R55 Syncope and collapse: Secondary | ICD-10-CM | POA: Diagnosis present

## 2022-09-28 DIAGNOSIS — R0600 Dyspnea, unspecified: Secondary | ICD-10-CM | POA: Diagnosis not present

## 2022-09-28 DIAGNOSIS — E1122 Type 2 diabetes mellitus with diabetic chronic kidney disease: Secondary | ICD-10-CM | POA: Diagnosis present

## 2022-09-28 DIAGNOSIS — I10 Essential (primary) hypertension: Secondary | ICD-10-CM | POA: Diagnosis not present

## 2022-09-28 DIAGNOSIS — R2689 Other abnormalities of gait and mobility: Secondary | ICD-10-CM | POA: Diagnosis not present

## 2022-09-28 DIAGNOSIS — E119 Type 2 diabetes mellitus without complications: Secondary | ICD-10-CM | POA: Diagnosis not present

## 2022-09-28 DIAGNOSIS — I480 Paroxysmal atrial fibrillation: Secondary | ICD-10-CM

## 2022-09-28 DIAGNOSIS — Z794 Long term (current) use of insulin: Secondary | ICD-10-CM

## 2022-09-28 DIAGNOSIS — E861 Hypovolemia: Secondary | ICD-10-CM | POA: Diagnosis present

## 2022-09-28 DIAGNOSIS — D472 Monoclonal gammopathy: Secondary | ICD-10-CM | POA: Diagnosis present

## 2022-09-28 DIAGNOSIS — I2510 Atherosclerotic heart disease of native coronary artery without angina pectoris: Secondary | ICD-10-CM | POA: Diagnosis present

## 2022-09-28 DIAGNOSIS — R001 Bradycardia, unspecified: Secondary | ICD-10-CM | POA: Diagnosis present

## 2022-09-28 DIAGNOSIS — E1165 Type 2 diabetes mellitus with hyperglycemia: Secondary | ICD-10-CM | POA: Diagnosis present

## 2022-09-28 DIAGNOSIS — N184 Chronic kidney disease, stage 4 (severe): Secondary | ICD-10-CM | POA: Diagnosis present

## 2022-09-28 DIAGNOSIS — Z79899 Other long term (current) drug therapy: Secondary | ICD-10-CM | POA: Diagnosis not present

## 2022-09-28 DIAGNOSIS — R296 Repeated falls: Secondary | ICD-10-CM | POA: Diagnosis present

## 2022-09-28 DIAGNOSIS — R278 Other lack of coordination: Secondary | ICD-10-CM | POA: Diagnosis not present

## 2022-09-28 DIAGNOSIS — E876 Hypokalemia: Secondary | ICD-10-CM | POA: Diagnosis present

## 2022-09-28 DIAGNOSIS — N179 Acute kidney failure, unspecified: Secondary | ICD-10-CM | POA: Diagnosis present

## 2022-09-28 DIAGNOSIS — Z7901 Long term (current) use of anticoagulants: Secondary | ICD-10-CM | POA: Diagnosis not present

## 2022-09-28 DIAGNOSIS — E872 Acidosis, unspecified: Secondary | ICD-10-CM | POA: Insufficient documentation

## 2022-09-28 DIAGNOSIS — R9431 Abnormal electrocardiogram [ECG] [EKG]: Secondary | ICD-10-CM | POA: Insufficient documentation

## 2022-09-28 DIAGNOSIS — R41841 Cognitive communication deficit: Secondary | ICD-10-CM | POA: Diagnosis not present

## 2022-09-28 DIAGNOSIS — W19XXXA Unspecified fall, initial encounter: Secondary | ICD-10-CM | POA: Diagnosis present

## 2022-09-28 DIAGNOSIS — Z951 Presence of aortocoronary bypass graft: Secondary | ICD-10-CM | POA: Diagnosis not present

## 2022-09-28 DIAGNOSIS — E785 Hyperlipidemia, unspecified: Secondary | ICD-10-CM | POA: Diagnosis present

## 2022-09-28 DIAGNOSIS — G9389 Other specified disorders of brain: Secondary | ICD-10-CM | POA: Diagnosis present

## 2022-09-28 DIAGNOSIS — I130 Hypertensive heart and chronic kidney disease with heart failure and stage 1 through stage 4 chronic kidney disease, or unspecified chronic kidney disease: Secondary | ICD-10-CM | POA: Diagnosis present

## 2022-09-28 DIAGNOSIS — R2681 Unsteadiness on feet: Secondary | ICD-10-CM | POA: Diagnosis not present

## 2022-09-28 DIAGNOSIS — Z7401 Bed confinement status: Secondary | ICD-10-CM | POA: Diagnosis not present

## 2022-09-28 LAB — BASIC METABOLIC PANEL
Anion gap: 16 — ABNORMAL HIGH (ref 5–15)
BUN: 95 mg/dL — ABNORMAL HIGH (ref 8–23)
CO2: 20 mmol/L — ABNORMAL LOW (ref 22–32)
Calcium: 8.7 mg/dL — ABNORMAL LOW (ref 8.9–10.3)
Chloride: 103 mmol/L (ref 98–111)
Creatinine, Ser: 3.66 mg/dL — ABNORMAL HIGH (ref 0.61–1.24)
GFR, Estimated: 16 mL/min — ABNORMAL LOW (ref >60)
Glucose, Bld: 228 mg/dL — ABNORMAL HIGH (ref 70–99)
Potassium: 3.3 mmol/L — ABNORMAL LOW (ref 3.5–5.1)
Sodium: 139 mmol/L (ref 135–145)

## 2022-09-28 LAB — CBC
HCT: 41.4 % (ref 39.0–52.0)
Hemoglobin: 13.3 g/dL (ref 13.0–17.0)
MCH: 25.7 pg — ABNORMAL LOW (ref 26.0–34.0)
MCHC: 32.1 g/dL (ref 30.0–36.0)
MCV: 80.1 fL (ref 80.0–100.0)
Platelets: 251 10*3/uL (ref 150–400)
RBC: 5.17 MIL/uL (ref 4.22–5.81)
RDW: 15.6 % — ABNORMAL HIGH (ref 11.5–15.5)
WBC: 12 10*3/uL — ABNORMAL HIGH (ref 4.0–10.5)
nRBC: 0 % (ref 0.0–0.2)

## 2022-09-28 LAB — URINALYSIS, COMPLETE (UACMP) WITH MICROSCOPIC
Bacteria, UA: NONE SEEN
Bilirubin Urine: NEGATIVE
Glucose, UA: 150 mg/dL — AB
Hgb urine dipstick: NEGATIVE
Ketones, ur: NEGATIVE mg/dL
Leukocytes,Ua: NEGATIVE
Nitrite: NEGATIVE
Protein, ur: 100 mg/dL — AB
Specific Gravity, Urine: 1.019 (ref 1.005–1.030)
pH: 5 (ref 5.0–8.0)

## 2022-09-28 LAB — CBG MONITORING, ED
Glucose-Capillary: 180 mg/dL — ABNORMAL HIGH (ref 70–99)
Glucose-Capillary: 199 mg/dL — ABNORMAL HIGH (ref 70–99)

## 2022-09-28 LAB — HEPATIC FUNCTION PANEL
ALT: 24 U/L (ref 0–44)
AST: 24 U/L (ref 15–41)
Albumin: 3.2 g/dL — ABNORMAL LOW (ref 3.5–5.0)
Alkaline Phosphatase: 67 U/L (ref 38–126)
Bilirubin, Direct: 0.3 mg/dL — ABNORMAL HIGH (ref 0.0–0.2)
Indirect Bilirubin: 1.2 mg/dL — ABNORMAL HIGH (ref 0.3–0.9)
Total Bilirubin: 1.5 mg/dL — ABNORMAL HIGH (ref 0.3–1.2)
Total Protein: 7 g/dL (ref 6.5–8.1)

## 2022-09-28 LAB — TROPONIN I (HIGH SENSITIVITY): Troponin I (High Sensitivity): 58 ng/L — ABNORMAL HIGH (ref <18)

## 2022-09-28 LAB — GLUCOSE, CAPILLARY
Glucose-Capillary: 244 mg/dL — ABNORMAL HIGH (ref 70–99)
Glucose-Capillary: 244 mg/dL — ABNORMAL HIGH (ref 70–99)

## 2022-09-28 LAB — CK
Total CK: 156 U/L (ref 49–397)
Total CK: 162 U/L (ref 49–397)

## 2022-09-28 LAB — SODIUM, URINE, RANDOM: Sodium, Ur: 56 mmol/L

## 2022-09-28 LAB — LACTIC ACID, PLASMA: Lactic Acid, Venous: 1.2 mmol/L (ref 0.5–1.9)

## 2022-09-28 LAB — MAGNESIUM: Magnesium: 3.4 mg/dL — ABNORMAL HIGH (ref 1.7–2.4)

## 2022-09-28 LAB — CREATININE, URINE, RANDOM: Creatinine, Urine: 87 mg/dL

## 2022-09-28 MED ORDER — ISOSORBIDE MONONITRATE ER 60 MG PO TB24
60.0000 mg | ORAL_TABLET | Freq: Every day | ORAL | Status: DC
  Administered 2022-09-28 – 2022-10-03 (×6): 60 mg via ORAL
  Filled 2022-09-28 (×3): qty 1
  Filled 2022-09-28: qty 2
  Filled 2022-09-28 (×2): qty 1

## 2022-09-28 MED ORDER — APIXABAN 5 MG PO TABS
5.0000 mg | ORAL_TABLET | Freq: Two times a day (BID) | ORAL | Status: DC
  Administered 2022-09-28 – 2022-10-03 (×11): 5 mg via ORAL
  Filled 2022-09-28 (×11): qty 1

## 2022-09-28 MED ORDER — LORAZEPAM 2 MG/ML IJ SOLN
0.5000 mg | Freq: Four times a day (QID) | INTRAMUSCULAR | Status: DC | PRN
  Administered 2022-09-28: 0.5 mg via INTRAVENOUS
  Filled 2022-09-28: qty 1

## 2022-09-28 MED ORDER — ACETAMINOPHEN 325 MG PO TABS: 650.0000 mg | ORAL_TABLET | Freq: Four times a day (QID) | ORAL | Status: DC | PRN

## 2022-09-28 MED ORDER — SODIUM CHLORIDE 0.9% FLUSH
3.0000 mL | Freq: Two times a day (BID) | INTRAVENOUS | Status: DC
  Administered 2022-09-28 – 2022-10-03 (×12): 3 mL via INTRAVENOUS

## 2022-09-28 MED ORDER — TAMSULOSIN HCL 0.4 MG PO CAPS
0.4000 mg | ORAL_CAPSULE | Freq: Every day | ORAL | Status: DC
  Administered 2022-09-28 – 2022-10-03 (×6): 0.4 mg via ORAL
  Filled 2022-09-28 (×6): qty 1

## 2022-09-28 MED ORDER — SODIUM CHLORIDE 0.9 % IV SOLN: INTRAVENOUS | Status: AC

## 2022-09-28 MED ORDER — ACETAMINOPHEN 650 MG RE SUPP: 650.0000 mg | Freq: Four times a day (QID) | RECTAL | Status: DC | PRN

## 2022-09-28 MED ORDER — INSULIN ASPART 100 UNIT/ML IJ SOLN
0.0000 [IU] | Freq: Three times a day (TID) | INTRAMUSCULAR | Status: DC
  Administered 2022-09-28 (×2): 1 [IU] via SUBCUTANEOUS
  Administered 2022-09-29: 3 [IU] via SUBCUTANEOUS
  Administered 2022-09-29 – 2022-09-30 (×4): 1 [IU] via SUBCUTANEOUS
  Administered 2022-09-30 – 2022-10-01 (×3): 2 [IU] via SUBCUTANEOUS
  Administered 2022-10-01: 3 [IU] via SUBCUTANEOUS
  Administered 2022-10-02: 4 [IU] via SUBCUTANEOUS
  Administered 2022-10-02: 3 [IU] via SUBCUTANEOUS
  Administered 2022-10-02 – 2022-10-03 (×3): 2 [IU] via SUBCUTANEOUS

## 2022-09-28 MED ORDER — METOPROLOL TARTRATE 25 MG PO TABS
37.5000 mg | ORAL_TABLET | Freq: Two times a day (BID) | ORAL | Status: DC
  Administered 2022-09-28: 37.5 mg via ORAL
  Filled 2022-09-28: qty 2

## 2022-09-28 MED ORDER — INSULIN ASPART 100 UNIT/ML IJ SOLN
0.0000 [IU] | Freq: Every day | INTRAMUSCULAR | Status: DC
  Administered 2022-09-28 – 2022-09-29 (×2): 2 [IU] via SUBCUTANEOUS
  Administered 2022-09-30: 3 [IU] via SUBCUTANEOUS
  Administered 2022-10-01: 2 [IU] via SUBCUTANEOUS
  Administered 2022-10-02: 4 [IU] via SUBCUTANEOUS

## 2022-09-28 NOTE — Hospital Course (Addendum)
Jake Gross was admitted to the hospital with the working diagnosis of renal failure.   81 yo male with the past medical history of coronary artery disease, heart failure, T2DM, paroxysmal atrial fibrillation, MGUS, and CKD stage 4 who presented with generalized weakness, lightheadedness and frequent falls. Worsening symptoms for the last 4 days, associated with dyspnea with exertion. On his initial physical examination his blood pressure was 134/76, HR 52, RR 18 and 02 saturation 97%, lungs with no wheezing or rales, heart with S1 and S2 present and rhythmic, abdomen with no distention, no lower extremity edema.   Na 137, K 4.3 CL 96 bicarbonate 20 glucose 286 bun 100 cr 3,9  Lactic acid 4,0 Wbc 9,8 hgb 15.2 plt 265   Urine analysis SG 1,019, 100 protein, pH 5.0 negative leukocytes, 0-5 rbc   Chest radiograph with no infiltrates, mild cardiomegaly.  Head CT with mild frontal scalp edema. No acute intracranial changes.  Left frontotemporal encephalomalacia due to remote ischemia.  Cervical CT with no acute changes.  CT chest abdomen and pelvis with no acute changes.   EKG 56 bpm, normal axis, normal intervals, manually corrected qtc 0.480, sinus rhythm with poor R R wave progression and LVH, no significant ST segment or T wave changes.   12/15 renal function is improving, continue to be very weak and deconditioned.  12/16 patient has been bradycardic, metoprolol held. Patient will need SNF at the time of his discharge.

## 2022-09-28 NOTE — Assessment & Plan Note (Addendum)
Telemetry personally reviewed, patient with sinus bradycardia 50 to 60 bpm, with occasional PAC and PVC. Continue to hold on metoprolol and continue telemetry monitoring.   Out of bed to chair tid with meals. Continue with Pt and Ot.  Anticoagulation with apixaban.

## 2022-09-28 NOTE — Assessment & Plan Note (Addendum)
Hypokalemia   Renal function today with serum cr at 2,92 with K at 3,6 and serum bicarbonate at 23. Na 139 and CL 105   Discontinue IV fluids and continue to encourage po intake.  Follow up renal function in am, avoid hypotension and nephrotoxic medications.

## 2022-09-28 NOTE — Progress Notes (Signed)
Pt received in room 6E11. Alert and oriented and in NAD. Vitals taken and telemetry applied. Skin assessment done. Skin tears noted to both knees and right elbow. Scabbed over abrasive areas noted to forehead. Redness to noted to sacral area. Foam dressings applied to sacrum and opened areas after cleansing. Pt made comfortable. Report given to [place], RN nightshift.

## 2022-09-28 NOTE — H&P (Signed)
History and Physical    Jake Gross ONG:[id number] DOB: 1941/07/21 DOA: 09/27/2022  PCP: Michela Pitcher, NP   Patient coming from: Home   Chief Complaint: General weakness, fatigue, lightheadedness, falls   HPI: Jake Gross is a pleasant 81 y.o. male with medical history significant for CAD, chronic HFpEF, insulin-dependent diabetes mellitus, PAF on Eliquis, MGUS, and CKD 4 who presents to the emergency department with generalized weakness, fatigue, lightheadedness, and falls.  Patient reports approximately 4 days of progressive generalized weakness and fatigue, lightheadedness when standing, and 3 falls in the past day.  He also feels short of breath with minimal exertion for the past 4 days.  He denies any cough, fever, chills, chest pain, or focal numbness or weakness.  He reports falling due to severe lightheadedness but denies hitting his head and does not believe that he actually lost consciousness.   ED Course: Upon arrival to the ED, patient is found to be afebrile and saturating well on room air with heart rate in the 50s and stable blood pressure.  EKG demonstrates sinus rhythm with rate 56, PAC, LVH, and QTc interval 629.  No acute findings noted on plain radiographs of the pelvis and chest.  Head CT and cervical spine CT were negative for acute findings.  There was no hydronephrosis noted on CT but mild changes of early cirrhosis were seen.  Blood work most notable for BUN 100, creatinine 3.99, and lactic acid 4.0.  Patient was given a liter of normal saline and 1 g IV magnesium in the ED.  Review of Systems:  All other systems reviewed and apart from HPI, are negative.  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    CAD (coronary artery disease) 09/29/2019   Cervical spondylosis    Chronic anticoagulation    Chronic diastolic CHF (congestive heart failure) ([place]) 12/17/2018   CKD (chronic kidney disease) 12/17/2018   Diabetes mellitus    History of GI bleed 09/29/2019   Hyperlipidemia    Hypertension    Iron deficiency anemia due to chronic blood loss    Paroxysmal atrial fibrillation ([place]) 12/17/2018   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 NO ISCHEMIA   COLONOSCOPY W/ POLYPECTOMY     5 polyps   COLONOSCOPY WITH PROPOFOL N/A 08/29/2019   Procedure: COLONOSCOPY WITH PROPOFOL;  Surgeon: Doran Stabler, MD;  Location: [place];  Service: Gastroenterology;  Laterality: N/A;   CORONARY ARTERY BYPASS GRAFT N/A 11/26/2018   Procedure: CORONARY ARTERY BYPASS GRAFTING (CABG), ON PUMP, TIMES FOUR, USING LEFT INTERNAL MAMMARY ARTERY AND ENDOSCOPICALLY HARVESTED LEFT SAPHENOUS VEIN;  Surgeon: Gaye Pollack, MD;  Location: [place];  Service: Open Heart Surgery;  Laterality: N/A;   ESOPHAGOGASTRODUODENOSCOPY (EGD) WITH PROPOFOL N/A 08/29/2019   Procedure: ESOPHAGOGASTRODUODENOSCOPY (EGD) WITH PROPOFOL;  Surgeon: Doran Stabler, MD;  Location: [place];  Service: Gastroenterology;  Laterality: N/A;   EUS N/A 03/11/2015   Procedure: LOWER ENDOSCOPIC ULTRASOUND (EUS);  Surgeon: Milus Banister, MD;  Location:  [facility];  Service: Endoscopy;  Laterality: N/A;   FLEXIBLE SIGMOIDOSCOPY N/A 02/02/2015   Procedure: FLEXIBLE SIGMOIDOSCOPY;  Surgeon: Inda Castle, MD;  Location: [facility];  Service: Endoscopy;  Laterality: N/A;  ERBE   HEMOSTASIS CLIP PLACEMENT  08/29/2019   Procedure: HEMOSTASIS CLIP PLACEMENT;  Surgeon: Doran Stabler, MD;  Location: [place] [facility];  Service: Gastroenterology;;   HOT HEMOSTASIS N/A  08/29/2019   Procedure: HOT HEMOSTASIS (ARGON PLASMA COAGULATION/BICAP);  Surgeon: Doran Stabler, MD;  Location: [place];  Service: Gastroenterology;  Laterality: N/A;   LEFT HEART CATH AND CORONARY ANGIOGRAPHY N/A 11/20/2018   Procedure: LEFT HEART CATH AND CORONARY ANGIOGRAPHY;  Surgeon: Burnell Blanks, MD;  Location: [place] [facility];  Service: Cardiovascular;  Laterality: N/A;   LOOP RECORDER INSERTION N/A 08/14/2017   Procedure: LOOP RECORDER INSERTION;  Surgeon: Thompson Grayer, MD;  Location: [place] [facility];  Service: Cardiovascular;  Laterality: N/A;   PARTIAL PROCTECTOMY BY TEM N/A 04/08/2015   Procedure: TEM PARTIAL PROCTECTOMY OF RECTAL MASS;  Surgeon: Michael Boston, MD;  Location: [facility];  Service: General;  Laterality: N/A;   POLYPECTOMY  08/29/2019   Procedure: POLYPECTOMY;  Surgeon: Doran Stabler, MD;  Location: [facility];  Service: Gastroenterology;;   TEE WITHOUT CARDIOVERSION N/A 06/25/2017   Procedure: TRANSESOPHAGEAL ECHOCARDIOGRAM (TEE);  Surgeon: Skeet Latch, MD;  Location: [place];  Service: Cardiovascular;  Laterality: N/A;   TEE WITHOUT CARDIOVERSION N/A 11/26/2018   Procedure: TRANSESOPHAGEAL ECHOCARDIOGRAM (TEE);  Surgeon: Gaye Pollack, MD;  Location: [place];  Service: Open Heart Surgery;  Laterality: N/A;   TONSILLECTOMY AND ADENOIDECTOMY     as child    Social History:   reports that he quit smoking about 28 years ago. His smoking use included cigarettes. He has never used smokeless tobacco. He reports that he does not drink alcohol and does not use drugs.  No Known Allergies  Family History  Problem Relation Age of Onset   Cancer Father        "all over"   Coronary artery disease Brother    Diabetes Brother    Stroke Mother    Diabetes Mother    Cancer Brother    Colon cancer Neg Hx    Esophageal cancer Neg Hx    Rectal cancer Neg Hx    Stomach cancer Neg Hx      Prior to Admission medications   Medication Sig  Start Date End Date Taking? Authorizing Provider  amLODipine (NORVASC) 5 MG tablet Take 1 tablet (5 mg total) by mouth daily. Please call to schedule an overdue appointment with Dr. Angelena Form for refills, [phone], thank you. 2ND ATTEMPT 09/19/22  Yes Burnell Blanks, MD  chlorthalidone (HYGROTON) 25 MG tablet Take 25 mg by mouth daily.   Yes [provider]  ELIQUIS 5 MG TABS tablet TAKE 1 TABLET BY MOUTH TWICE A DAY 02/20/22  Yes Burnell Blanks, MD  hydrALAZINE (APRESOLINE) 100 MG tablet Take 1 tablet (100 mg total) by mouth 3 (three) times daily. Please call to schedule an overdue appointment with Dr. Angelena Form for refills, [phone], thank you. 1st attempt. 09/19/22  Yes Burnell Blanks, MD  insulin glargine-yfgn (SEMGLEE, YFGN,) 100 UNIT/ML Pen Inject 60 Units into the skin every morning. 07/18/21  Yes Renato Shin, MD  isosorbide mononitrate (IMDUR) 60 MG 24 hr tablet Take 1 tablet (60 mg total) by mouth daily. Please call to schedule an overdue appointment with Dr. Angelena Form for  refills, [phone], thank you. 1st attempt. 09/04/22  Yes Burnell Blanks, MD  metoprolol tartrate (LOPRESSOR) 25 MG tablet Take 1.5 tablets (37.5 mg total) by mouth 2 (two) times daily. Please call to schedule an overdue appointment with Dr. Angelena Form for refills, [phone], thank you. 1st attempt. 09/19/22  Yes Burnell Blanks, MD  tamsulosin (FLOMAX) 0.4 MG CAPS capsule TAKE 1 CAPSULE BY MOUTH EVERY DAY 12/01/19  Yes Baity, Coralie Keens, NP  torsemide (DEMADEX) 20 MG tablet Take 1 tablet (20 mg total) by mouth 2 (two) times daily. Pt needs to make appt with provider for further refills - 1st attempt 07/18/22  Yes Burnell Blanks, MD  Insulin Pen Needle (BD PEN NEEDLE NANO 2ND GEN) 32G X 4 MM MISC Use with insulin pen as directed 01/19/22   Michela Pitcher, NP  Lancets [facility] ULTRASOFT) lancets Use as instructed 04/27/21   Renato Shin, MD  rosuvastatin (CRESTOR) 20  MG tablet TAKE 1 TABLET BY MOUTH AT BEDTIME. SCHEDULE PHYSICAL EXAM 09/02/21   Dutch Quint B, FNP    Physical Exam: Vitals:   09/28/22 0115 09/28/22 0130 09/28/22 0145 09/28/22 0200  BP: 134/76 (!) 151/80 (!) 141/75 124/64  Pulse: (!) 52 69 (!) 58 (!) 51  Resp: '18 16 16 '$ (!) 21  Temp:      TempSrc:      SpO2: 94% 97% 99% 96%  Weight:      Height:        Constitutional: NAD, calm  Eyes: PERTLA, lids and conjunctivae normal ENMT: Mucous membranes are dry. Posterior pharynx clear of any exudate or lesions.   Neck: supple, no masses  Respiratory: no wheezing, no crackles. No accessory muscle use.  Cardiovascular: S1 & S2 heard, regular rate and rhythm. No extremity edema.   Abdomen: No distension, no tenderness, soft. Bowel sounds active.  Musculoskeletal: no clubbing / cyanosis. No joint deformity upper and lower extremities.   Skin: superficial abrasions. Warm, dry, well-perfused. Neurologic: CN 2-12 grossly intact. Sensation to light touch intact. Strength 5/5 in all 4 limbs. Alert and oriented.  Psychiatric: Pleasant. Cooperative.    Labs and Imaging on Admission: I have personally reviewed following labs and imaging studies  CBC: Recent Labs  Lab 09/27/22 2206 09/27/22 2212  WBC 9.8  --   HGB 15.2 15.6  16.3  HCT 45.6 46.0  48.0  MCV 77.8*  --   PLT 265  --    Basic Metabolic Panel: Recent Labs  Lab 09/27/22 2206 09/27/22 2212  NA 137 135  136  K 4.3 4.5  4.5  CL 96* 101  CO2 20*  --   GLUCOSE 286* 291*  BUN 100* 97*  CREATININE 3.99* 4.20*  CALCIUM 9.3  --    GFR: Estimated Creatinine Clearance: 15.6 mL/min (A) (by C-G formula based on SCr of 4.2 mg/dL (H)). Liver Function Tests: Recent Labs  Lab 09/27/22 2206  AST 44*  ALT 28  ALKPHOS 75  BILITOT 1.7*  PROT 7.9  ALBUMIN 3.7   No results for input(s): "LIPASE", "AMYLASE" in the last 168 hours. No results for input(s): "AMMONIA" in the last 168 hours. Coagulation Profile: Recent Labs  Lab  09/27/22 2206  INR 1.2   Cardiac Enzymes: No results for input(s): "CKTOTAL", "CKMB", "CKMBINDEX", "TROPONINI" in the last 168 hours. BNP (last 3 results) No results for input(s): "PROBNP" in the last 8760 hours. HbA1C: No results for input(s): "HGBA1C" in the last 72 hours. CBG: Recent Labs  Lab 09/27/22  [place]*   Lipid Profile: No results for input(s): "CHOL", "HDL", "LDLCALC", "TRIG", "CHOLHDL", "LDLDIRECT" in the last 72 hours. Thyroid Function Tests: No results for input(s): "TSH", "T4TOTAL", "FREET4", "T3FREE", "THYROIDAB" in the last 72 hours. Anemia Panel: No results for input(s): "VITAMINB12", "FOLATE", "FERRITIN", "TIBC", "IRON", "RETICCTPCT" in the last 72 hours. Urine analysis:    Component Value Date/Time   COLORURINE YELLOW 11/26/2018 0304   APPEARANCEUR HAZY (A) 11/26/2018 0304   LABSPEC 1.017 11/26/2018 0304   PHURINE 5.0 11/26/2018 0304   GLUCOSEU 150 (A) 11/26/2018 0304   HGBUR NEGATIVE 11/26/2018 0304   BILIRUBINUR NEGATIVE 11/26/2018 0304   KETONESUR NEGATIVE 11/26/2018 0304   PROTEINUR >=300 (A) 11/26/2018 0304   UROBILINOGEN 0.2 04/11/2015 1658   NITRITE NEGATIVE 11/26/2018 0304   LEUKOCYTESUR NEGATIVE 11/26/2018 0304   Sepsis Labs: '@LABRCNTIP'$ (procalcitonin:4,lacticidven:4) )No results found for this or any previous visit (from the past 240 hour(s)).   Radiological Exams on Admission: CT CERVICAL SPINE WO CONTRAST  Result Date: 09/27/2022 CLINICAL DATA:  Polytrauma, blunt Multiple falls. EXAM: CT CERVICAL SPINE WITHOUT CONTRAST TECHNIQUE: Multidetector CT imaging of the cervical spine was performed without intravenous contrast. Multiplanar CT image reconstructions were also generated. RADIATION DOSE REDUCTION: This exam was performed according to the departmental dose-optimization program which includes automated exposure control, adjustment of the mA and/or kV according to patient size and/or use of iterative reconstruction technique.  COMPARISON:  None Available. FINDINGS: Alignment: Normal. Skull base and vertebrae: No acute fracture. Vertebral body heights are maintained. The dens and skull base are intact. Soft tissues and spinal canal: No prevertebral fluid or swelling. No visible canal hematoma. Disc levels: Diffuse degenerative disc disease from C3-C4 through C6-C7. C3-C4 on colo CIS may be degenerative or congenital. Upper chest: Assessed on concurrent chest CT, reported separately. Other: None. IMPRESSION: No acute fracture or subluxation. Multilevel degenerative disc disease. Electronically Signed   By: Keith Rake M.D.   On: 09/27/2022 23:08   CT HEAD WO CONTRAST  Result Date: 09/27/2022 CLINICAL DATA:  Head trauma, moderate-severe Multiple ground level falls, on anticoagulation. EXAM: CT HEAD WITHOUT CONTRAST TECHNIQUE: Contiguous axial images were obtained from the base of the skull through the vertex without intravenous contrast. RADIATION DOSE REDUCTION: This exam was performed according to the departmental dose-optimization program which includes automated exposure control, adjustment of the mA and/or kV according to patient size and/or use of iterative reconstruction technique. COMPARISON:  Head CT and brain MRI 06/30/2017 FINDINGS: Brain: No acute intracranial hemorrhage. No subdural or extra-axial collection. Moderate generalized atrophy. Left temporoparietal encephalomalacia, remote but new from 2018. No hydrocephalus. No midline shift or mass effect. Vascular: Atherosclerosis of skullbase vasculature without hyperdense vessel or abnormal calcification. Skull: No fracture or focal lesion. Sinuses/Orbits: No acute findings. Other: Mild left frontal scalp edema. IMPRESSION: 1. Mild left frontal scalp edema. No acute intracranial abnormality. No skull fracture. 2. Moderate atrophy. Left temporoparietal encephalomalacia, likely remote ischemia, however new from 2018. Electronically Signed   By: Keith Rake M.D.   On:  09/27/2022 23:03   CT CHEST ABDOMEN PELVIS W CONTRAST  Result Date: 09/27/2022 CLINICAL DATA:  Multiple recent falls while on blood thinners, initial encounter EXAM: CT CHEST, ABDOMEN, AND PELVIS WITH CONTRAST TECHNIQUE: Multidetector CT imaging of the chest, abdomen and pelvis was performed following the standard protocol during bolus administration of intravenous contrast. RADIATION DOSE REDUCTION: This exam was performed according to the departmental dose-optimization program which includes automated exposure control, adjustment of the mA and/or kV  according to patient size and/or use of iterative reconstruction technique. CONTRAST:  10m OMNIPAQUE IOHEXOL 350 MG/ML SOLN COMPARISON:  08/27/2019 FINDINGS: CT CHEST FINDINGS Cardiovascular: Atherosclerotic calcifications of the thoracic aorta are noted. Coronary calcifications are seen as well as changes of prior coronary bypass grafting. No cardiac enlargement is seen. The pulmonary artery shows no filling defect although not timed for true embolus evaluation. Mediastinum/Nodes: Thoracic inlet is within normal limits. No hilar or mediastinal adenopathy is noted. The esophagus as visualized is within normal limits. 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 No focal infiltrate or sizable effusion is noted. No sizable parenchymal nodule is seen. Musculoskeletal: Degenerative changes of the thoracic spine are noted. No acute rib abnormality is seen. CT ABDOMEN PELVIS FINDINGS Hepatobiliary: Fatty infiltration of the liver is noted. Mild nodularity of the liver is noted as well. No focal mass is seen. Gallstones are seen within the gallbladder without complicating factors. Pancreas: Unremarkable. No pancreatic ductal dilatation or surrounding inflammatory changes. Spleen: Normal in size without focal abnormality. 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 This measures approximately 11 mm. The kidneys show  no renal calculi or obstructive changes. Normal enhancement is noted bilaterally. The bladder is well distended. Stomach/Bowel: Scattered diverticular change of the colon is noted without evidence of diverticulitis. No obstructive changes are seen. The appendix is within normal limits. Small bowel and stomach are unremarkable. Vascular/Lymphatic: Aortic atherosclerosis. No enlarged abdominal or pelvic lymph nodes. Reproductive: Prostate is enlarged. Other: No abdominal wall hernia or abnormality. No abdominopelvic ascites. Musculoskeletal: Degenerative changes of lumbar spine are seen. IMPRESSION: CT of the chest: No acute intrathoracic abnormality is noted. 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 No follow-up imaging is recommended. JACR 2017 Aug; 14(8):1038-44, JCAT 2016 Mar-Apr; 40(2):194-200, Urol J 2006 Spring; 3(2):71-4. Diverticulosis without diverticulitis. Cholelithiasis without complicating factors. No acute abnormality to correspond with the given clinical history. Electronically Signed   By: MInez CatalinaM.D.   On: 09/27/2022 23:01   DG Pelvis Portable  Result Date: 09/27/2022 CLINICAL DATA:  Trauma, fall. EXAM: PORTABLE PELVIS 1-2 VIEWS COMPARISON:  None Available. FINDINGS: The cortical margins of the bony pelvis are intact. No fracture. Pubic symphysis and sacroiliac joints are congruent. Both femoral heads are well-seated in the respective acetabula. IMPRESSION: No pelvic fracture. Electronically Signed   By: MKeith RakeM.D.   On: 09/27/2022 22:30   DG Chest Port 1 View  Result Date: 09/27/2022 CLINICAL DATA:  Trauma, fall. EXAM: PORTABLE CHEST 1 VIEW COMPARISON:  Radiograph 08/18/2021 FINDINGS: Prior median sternotomy and CABG.The cardiomediastinal contours are normal. The lungs are clear. Pulmonary vasculature is normal. No consolidation, pleural effusion, or pneumothorax. No acute osseous  abnormalities are seen. Left chest wall loop recorder. IMPRESSION: No acute chest findings or evidence of traumatic injury. Electronically Signed   By: MKeith RakeM.D.   On: 09/27/2022 22:29    EKG: Independently reviewed. Sinus rhythm, PAC, LVH with repolarization abnormality, prolonged QT interval.   Assessment/Plan   1. AKI superimposed on CKD IV  - BUN is 100 and SCR 3.99 on admission, up from 47 & 2.37 in July 2023  - He appears hypovolemic and this is likely prerenal  - Check UA with microscopy, check FEUrea, hold diuretic, continue IVF hydration, renally-dose medications, repeat chem panel in am    2. Lactic acidosis  - Lactic acid is 4.0 in ED  - No  infectious s/s, likely related to liver disease and hypovolemia  - Continue IVF hydration, trend lactate    3. CAD  - Hx of CABG in February 2020  - No anginal complaints  - Continue ASA, Imdur, metoprolol    4. Chronic diastolic CHF  - Appears hypovolemic on admission  - Hold diuretic, continue IVF hydration, monitor wt and I/Os    5. PAF  - Continue Eliquis and metoprolol    6. Insulin-dependent DM  - A1c was 6.9% in July 2023  - Check CBGs and use low-intensity SSI for now    DVT prophylaxis: Eliquis  Code Status: Full  Level of Care: Level of care: Telemetry Cardiac Family Communication: None present  Disposition Plan:  Patient is from: home  Anticipated d/c is to: TBD Anticipated d/c date is: 10/01/22  Patient currently: Pending improved renal function, may need PT eval  Consults called: none  Admission status: Inpatient     Vianne Bulls, MD [facility]  09/28/2022, 2:03 AM

## 2022-09-28 NOTE — Assessment & Plan Note (Addendum)
Continue blood pressure control Follow up with Pt and Ot Patient with multiple falls at home, positive head trauma and headache. Add oxycodone for pain control.  Chronic left fronto temporal encephalomalacia.

## 2022-09-28 NOTE — Assessment & Plan Note (Signed)
No active chest pain or angina. Continue blood pressure control.

## 2022-09-28 NOTE — Assessment & Plan Note (Addendum)
Systolic blood pressure Continue isosorbide and amlodipine Resume hydralazine at a lower dose of 50 mg po bid.  Will continue to hold home medications including diuretic therapy (torsemide and chlorthalidone).

## 2022-09-28 NOTE — ED Notes (Signed)
.ED TO INPATIENT HANDOFF REPORT  ED Nurse Name and Phone #: Karl Ito  S Name/Age/Gender Jake Gross 81 y.o. male Room/Bed: RESUSC/RESUSC  Code Status   Code Status: Full Code  Home/SNF/Other Home Patient oriented to: self, place, time, and situation Is this baseline? Yes   Triage Complete: Triage complete  Chief Complaint Acute renal failure 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 ([place]) [N17.9, N18.4]  Triage Note No notes on file   Allergies No Known Allergies  Level of Care/Admitting Diagnosis ED Disposition     ED Disposition  Admit   Condition  --   [facility]: [place] [[id number]]  Level of Care: Telemetry Cardiac [[id number]]  May admit patient to [facility] or [facility] if equivalent level of care is available:: No  Covid Evaluation: Asymptomatic - no recent exposure (last 10 days) testing not required  Diagnosis: Acute renal failure 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 [facility]) [[id number]]  Admitting Physician: Vianne Bulls [[id number]]  Attending Physician: Vianne Bulls [[id number]]  Certification:: I certify this patient will need inpatient services for at least 2 midnights  Estimated Length of Stay: 3          B Medical/Surgery History 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    Arthritis    AVM (arteriovenous malformation) of small bowel, 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   CAD (coronary artery disease) 09/29/2019   Cervical spondylosis    Chronic anticoagulation    Chronic diastolic CHF (congestive heart failure) ([place]) 12/17/2018   CKD (chronic kidney disease) 12/17/2018   Diabetes mellitus    History of GI bleed 09/29/2019   Hyperlipidemia    Hypertension    Iron deficiency anemia due to chronic blood loss    Paroxysmal atrial  fibrillation ([place]) 12/17/2018   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 NO ISCHEMIA   COLONOSCOPY W/ POLYPECTOMY     5 polyps   COLONOSCOPY WITH PROPOFOL N/A 08/29/2019   Procedure: COLONOSCOPY WITH PROPOFOL;  Surgeon: Doran Stabler, MD;  Location: [place];  Service: Gastroenterology;  Laterality: N/A;   CORONARY ARTERY BYPASS GRAFT N/A 11/26/2018   Procedure: CORONARY ARTERY BYPASS GRAFTING (CABG), ON PUMP, TIMES FOUR, USING LEFT INTERNAL MAMMARY ARTERY AND ENDOSCOPICALLY HARVESTED LEFT SAPHENOUS VEIN;  Surgeon: Gaye Pollack, MD;  Location: [place];  Service: Open Heart Surgery;  Laterality: N/A;   ESOPHAGOGASTRODUODENOSCOPY (EGD) WITH PROPOFOL N/A 08/29/2019   Procedure: ESOPHAGOGASTRODUODENOSCOPY (EGD) WITH PROPOFOL;  Surgeon: Doran Stabler, MD;  Location: [place];  Service: Gastroenterology;  Laterality: N/A;   EUS N/A 03/11/2015   Procedure: LOWER ENDOSCOPIC ULTRASOUND (EUS);  Surgeon: Milus Banister, MD;  Location[facility];  Service: Endoscopy;  Laterality: N/A;   FLEXIBLE SIGMOIDOSCOPY N/A 02/02/2015   Procedure: FLEXIBLE SIGMOIDOSCOPY;  Surgeon: Inda Castle, MD;  [facility];  Service: Endoscopy;  Laterality: N/A;  ERBE   HEMOSTASIS CLIP PLACEMENT  08/29/2019   Procedure: HEMOSTASIS CLIP PLACEMENT;  Surgeon: Doran Stabler, MD;  Location: [place];  Service: Gastroenterology;;   HOT HEMOSTASIS N/A 08/29/2019  Procedure: HOT HEMOSTASIS (ARGON PLASMA COAGULATION/BICAP);  Surgeon: Doran Stabler, MD;  Location: [place];  Service: Gastroenterology;  Laterality: N/A;   LEFT HEART CATH AND CORONARY ANGIOGRAPHY N/A 11/20/2018   Procedure: LEFT HEART CATH AND CORONARY ANGIOGRAPHY;  Surgeon: Burnell Blanks, MD;  Location: [place] [facility];  Service:  Cardiovascular;  Laterality: N/A;   LOOP RECORDER INSERTION N/A 08/14/2017   Procedure: LOOP RECORDER INSERTION;  Surgeon: Thompson Grayer, MD;  Location: [place] [facility];  Service: Cardiovascular;  Laterality: N/A;   PARTIAL PROCTECTOMY BY TEM N/A 04/08/2015   Procedure: TEM PARTIAL PROCTECTOMY OF RECTAL MASS;  Surgeon: Michael Boston, MD;  [facility];  Service: General;  Laterality: N/A;   POLYPECTOMY  08/29/2019   Procedure: POLYPECTOMY;  Surgeon: Doran Stabler, MD;  Location: [facility];  Service: Gastroenterology;;   TEE WITHOUT CARDIOVERSION N/A 06/25/2017   Procedure: TRANSESOPHAGEAL ECHOCARDIOGRAM (TEE);  Surgeon: Skeet Latch, MD;  Location: [place];  Service: Cardiovascular;  Laterality: N/A;   TEE WITHOUT CARDIOVERSION N/A 11/26/2018   Procedure: TRANSESOPHAGEAL ECHOCARDIOGRAM (TEE);  Surgeon: Gaye Pollack, MD;  Location: [place];  Service: Open Heart Surgery;  Laterality: N/A;   TONSILLECTOMY AND ADENOIDECTOMY     as child     A IV Location/Drains/Wounds Patient Lines/Drains/Airways Status     Active Line/Drains/Airways     Name Placement date Placement time Site Days   Peripheral IV 09/27/22 20 G Left Antecubital 09/27/22  2208  Antecubital  1   Incision (Closed) 11/26/18 Leg Left 11/26/18  1004  -- 1402   Incision (Closed) 11/26/18 Chest Other (Comment) 11/26/18  1004  -- 1402            Intake/Output Last 24 hours No intake or output data in the 24 hours ending 09/28/22 1608  Labs/Imaging Results for orders placed or performed during the hospital encounter of 09/27/22 (from the past 48 hour(s))  CBG monitoring, ED     Status: Abnormal   Collection Time: 09/27/22 10:03 PM  Result Value Ref Range   Glucose-Capillary 292 (H) 70 - 99 mg/dL    Comment: Glucose reference range applies only to samples taken after fasting for at least 8 hours.  Sample to Blood Bank     Status: None   Collection Time: 09/27/22 10:05 PM  Result Value Ref Range    Blood Bank Specimen SAMPLE AVAILABLE FOR TESTING    Sample Expiration      09/28/2022,2359 Performed at [facility], [place] [address], [place], [place] [address]   Comprehensive metabolic panel     Status: Abnormal   Collection Time: 09/27/22 10:06 PM  Result Value Ref Range   Sodium 137 135 - 145 mmol/L   Potassium 4.3 3.5 - 5.1 mmol/L    Comment: HEMOLYSIS AT THIS LEVEL MAY AFFECT RESULT   Chloride 96 (L) 98 - 111 mmol/L   CO2 20 (L) 22 - 32 mmol/L   Glucose, Bld 286 (H) 70 - 99 mg/dL    Comment: Glucose reference range applies only to samples taken after fasting for at least 8 hours.   BUN 100 (H) 8 - 23 mg/dL   Creatinine, Ser 3.99 (H) 0.61 - 1.24 mg/dL   Calcium 9.3 8.9 - 10.3 mg/dL   Total Protein 7.9 6.5 - 8.1 g/dL   Albumin 3.7 3.5 - 5.0 g/dL   AST 44 (H) 15 - 41 U/L    Comment: HEMOLYSIS AT THIS LEVEL MAY AFFECT RESULT  ALT 28 0 - 44 U/L    Comment: HEMOLYSIS AT THIS LEVEL MAY AFFECT RESULT   Alkaline Phosphatase 75 38 - 126 U/L   Total Bilirubin 1.7 (H) 0.3 - 1.2 mg/dL    Comment: HEMOLYSIS AT THIS LEVEL MAY AFFECT RESULT   GFR, Estimated 15 (L) >60 mL/min    Comment: (NOTE) Calculated using the CKD-EPI Creatinine Equation (2021)    Anion gap 21 (H) 5 - 15    Comment: ELECTROLYTES REPEATED TO VERIFY Performed at [facility] [address] [place], [place] [address]   CBC     Status: Abnormal   Collection Time: 09/27/22 10:06 PM  Result Value Ref Range   WBC 9.8 4.0 - 10.5 K/uL   RBC 5.86 (H) 4.22 - 5.81 MIL/uL   Hemoglobin 15.2 13.0 - 17.0 g/dL   HCT 45.6 39.0 - 52.0 %   MCV 77.8 (L) 80.0 - 100.0 fL   MCH 25.9 (L) 26.0 - 34.0 pg   MCHC 33.3 30.0 - 36.0 g/dL   RDW 15.4 11.5 - 15.5 %   Platelets 265 150 - 400 K/uL   nRBC 0.0 0.0 - 0.2 %    Comment: Performed at [facility], [place] [address] [place], [place] [address]  Ethanol     Status: None   Collection Time: 09/27/22 10:06 PM  Result Value Ref Range   Alcohol, Ethyl (B) <10 <10 mg/dL     Comment: (NOTE) Lowest detectable limit for serum alcohol is 10 mg/dL.  For medical purposes only. Performed at [facility], [place] [address] [place], [place] [address]   Lactic acid, plasma     Status: Abnormal   Collection Time: 09/27/22 10:06 PM  Result Value Ref Range   Lactic Acid, Venous 4.0 (HH) 0.5 - 1.9 mmol/L    Comment: CRITICAL RESULT CALLED TO, READ BACK BY AND VERIFIED WITH J.MOOREFIELD,RN. 2320 09/27/22. L.PAIT Performed at [facility], [place] [address] [place], [place] [address]   Protime-INR     Status: None   Collection Time: 09/27/22 10:06 PM  Result Value Ref Range   Prothrombin Time 15.2 11.4 - 15.2 seconds   INR 1.2 0.8 - 1.2    Comment: (NOTE) INR goal varies based on device and disease states. Performed at [facility], [place] [address] [place], [place] [address]   Troponin I (High Sensitivity)     Status: Abnormal   Collection Time: 09/27/22 10:06 PM  Result Value Ref Range   Troponin I (High Sensitivity) 46 (H) <18 ng/L    Comment: (NOTE) Elevated high sensitivity troponin I (hsTnI) values and significant  changes across serial measurements may suggest ACS but many other  chronic and acute conditions are known to elevate hsTnI results.  Refer to the "Links" section for chest pain algorithms and additional  guidance. Performed at [facility], [place] [address], [place], [place] [address]   Brain natriuretic peptide     Status: Abnormal   Collection Time: 09/27/22 10:10 PM  Result Value Ref Range   B Natriuretic Peptide 195.8 (H) 0.0 - 100.0 pg/mL    Comment: Performed at [facility] [address] [place], [place] [address]  I-Stat Chem 8, ED     Status: Abnormal   Collection Time: 09/27/22 10:12 PM  Result Value Ref Range   Sodium 136 135 - 145 mmol/L   Potassium 4.5 3.5 - 5.1 mmol/L   Chloride 101  98 - 111 mmol/L   BUN 97 (H) 8 - 23 mg/dL   Creatinine, Ser 4.20 (H) 0.61 - 1.24 mg/dL   Glucose, Bld 291 (H)  70 - 99 mg/dL    Comment: Glucose reference range applies only to samples taken after fasting for at least 8 hours.   Calcium, Ion 0.96 (L) 1.15 - 1.40 mmol/L   TCO2 20 (L) 22 - 32 mmol/L   Hemoglobin 16.3 13.0 - 17.0 g/dL   HCT 48.0 39.0 - 52.0 %  I-Stat venous blood gas, ([facility], [facility], [facility])     Status: Abnormal   Collection Time: 09/27/22 10:12 PM  Result Value Ref Range   pH, Ven 7.580 (H) 7.25 - 7.43   pCO2, Ven 22.9 (L) 44 - 60 mmHg   pO2, Ven 23 (LL) 32 - 45 mmHg   Bicarbonate 21.5 20.0 - 28.0 mmol/L   TCO2 22 22 - 32 mmol/L   O2 Saturation 53 %   Acid-Base Excess 2.0 0.0 - 2.0 mmol/L   Sodium 135 135 - 145 mmol/L   Potassium 4.5 3.5 - 5.1 mmol/L   Calcium, Ion 0.97 (L) 1.15 - 1.40 mmol/L   HCT 46.0 39.0 - 52.0 %   Hemoglobin 15.6 13.0 - 17.0 g/dL   Sample type VENOUS    Comment NOTIFIED PHYSICIAN   Troponin I (High Sensitivity)     Status: Abnormal   Collection Time: 09/28/22  1:00 AM  Result Value Ref Range   Troponin I (High Sensitivity) 58 (H) <18 ng/L    Comment: (NOTE) Elevated high sensitivity troponin I (hsTnI) values and significant  changes across serial measurements may suggest ACS but many other  chronic and acute conditions are known to elevate hsTnI results.  Refer to the "Links" section for chest pain algorithms and additional  guidance. Performed at [facility], [place] [address], [place], [place] [address]   Lactic acid, plasma     Status: None   Collection Time: 09/28/22  3:45 AM  Result Value Ref Range   Lactic Acid, Venous 1.2 0.5 - 1.9 mmol/L    Comment: Performed at [facility] [address] [place], [place] [address]  CK     Status: None   Collection Time: 09/28/22  3:45 AM  Result Value Ref Range   Total CK 162 49 - 397 U/L    Comment: Performed at [facility], [place] [address], [place], [place] [address]  Basic metabolic panel     Status: Abnormal   Collection Time: 09/28/22  3:45 AM  Result Value Ref Range   Sodium 139  135 - 145 mmol/L   Potassium 3.3 (L) 3.5 - 5.1 mmol/L   Chloride 103 98 - 111 mmol/L   CO2 20 (L) 22 - 32 mmol/L   Glucose, Bld 228 (H) 70 - 99 mg/dL    Comment: Glucose reference range applies only to samples taken after fasting for at least 8 hours.   BUN 95 (H) 8 - 23 mg/dL   Creatinine, Ser 3.66 (H) 0.61 - 1.24 mg/dL   Calcium 8.7 (L) 8.9 - 10.3 mg/dL   GFR, Estimated 16 (L) >60 mL/min    Comment: (NOTE) Calculated using the CKD-EPI Creatinine Equation (2021)    Anion gap 16 (H) 5 - 15    Comment: Performed at [facility] [address] [place],  [address]  Magnesium     Status: Abnormal   Collection Time: 09/28/22  3:45 AM  Result Value Ref Range   Magnesium 3.4 (H) 1.7 - 2.4 mg/dL    Comment: Performed at [facility] [address] [place], [place] [address]  Hepatic function panel     Status: Abnormal   Collection Time: 09/28/22  3:45 AM  Result Value Ref Range   Total Protein 7.0 6.5 - 8.1 g/dL   Albumin 3.2 (L) 3.5 - 5.0 g/dL   AST 24 15 - 41 U/L   ALT 24 0 - 44 U/L   Alkaline Phosphatase 67 38 - 126 U/L   Total Bilirubin 1.5 (H) 0.3 - 1.2 mg/dL   Bilirubin, Direct 0.3 (H) 0.0 - 0.2 mg/dL   Indirect Bilirubin 1.2 (H) 0.3 - 0.9 mg/dL    Comment: Performed at [facility] [address], [place], [place] [address]  CBC     Status: Abnormal   Collection Time: 09/28/22  3:45 AM  Result Value Ref Range   WBC 12.0 (H) 4.0 - 10.5 K/uL   RBC 5.17 4.22 - 5.81 MIL/uL   Hemoglobin 13.3 13.0 - 17.0 g/dL   HCT 41.4 39.0 - 52.0 %   MCV 80.1 80.0 - 100.0 fL   MCH 25.7 (L) 26.0 - 34.0 pg   MCHC 32.1 30.0 - 36.0 g/dL   RDW 15.6 (H) 11.5 - 15.5 %   Platelets 251 150 - 400 K/uL   nRBC 0.0 0.0 - 0.2 %    Comment: Performed at [facility], [place] [address] [place], [place] [address]  CK     Status: None   Collection Time: 09/28/22  3:45 AM  Result Value Ref Range   Total CK 156 49 - 397 U/L    Comment: Performed at [facility],  [place] [address], [place], [place] [address]  Urinalysis, Complete w Microscopic Urine, Clean Catch     Status: Abnormal   Collection Time: 09/28/22  7:28 AM  Result Value Ref Range   Color, Urine YELLOW YELLOW   APPearance CLEAR CLEAR   Specific Gravity, Urine 1.019 1.005 - 1.030   pH 5.0 5.0 - 8.0   Glucose, UA 150 (A) NEGATIVE mg/dL   Hgb urine dipstick NEGATIVE NEGATIVE   Bilirubin Urine NEGATIVE NEGATIVE   Ketones, ur NEGATIVE NEGATIVE mg/dL   Protein, ur 100 (A) NEGATIVE mg/dL   Nitrite NEGATIVE NEGATIVE   Leukocytes,Ua NEGATIVE NEGATIVE   RBC / HPF 0-5 0 - 5 RBC/hpf   WBC, UA 0-5 0 - 5 WBC/hpf   Bacteria, UA NONE SEEN NONE SEEN   Squamous Epithelial / LPF 0-5 0 - 5   Hyaline Casts, UA PRESENT     Comment: Performed at [facility], [place] [address], [place], [place] [address]  Sodium, urine, random     Status: None   Collection Time: 09/28/22  7:28 AM  Result Value Ref Range   Sodium, Ur 56 mmol/L    Comment: Performed at [facility] [address] [place], [place] [address]  Creatinine, urine, random     Status: None   Collection Time: 09/28/22  7:28 AM  Result Value Ref Range   Creatinine, Urine 87 mg/dL    Comment: Performed at [facility] [address] [place], [place] [address]  CBG monitoring, ED     Status: Abnormal   Collection Time: 09/28/22  7:51 AM  Result Value Ref Range   Glucose-Capillary 180 (H) 70 - 99 mg/dL  Comment: Glucose reference range applies only to samples taken after fasting for at least 8 hours.  CBG monitoring, ED     Status: Abnormal   Collection Time: 09/28/22 12:30 PM  Result Value Ref Range   Glucose-Capillary 199 (H) 70 - 99 mg/dL    Comment: Glucose reference range applies only to samples taken after fasting for at least 8 hours.   Comment 1 Notify RN    Comment 2 Document in Chart    CT CERVICAL SPINE WO CONTRAST  Result Date: 09/27/2022 CLINICAL DATA:  Polytrauma, blunt Multiple falls. EXAM: CT CERVICAL SPINE  WITHOUT CONTRAST TECHNIQUE: Multidetector CT imaging of the cervical spine was performed without intravenous contrast. Multiplanar CT image reconstructions were also generated. RADIATION DOSE REDUCTION: This exam was performed according to the departmental dose-optimization program which includes automated exposure control, adjustment of the mA and/or kV according to patient size and/or use of iterative reconstruction technique. COMPARISON:  None Available. FINDINGS: Alignment: Normal. Skull base and vertebrae: No acute fracture. Vertebral body heights are maintained. The dens and skull base are intact. Soft tissues and spinal canal: No prevertebral fluid or swelling. No visible canal hematoma. Disc levels: Diffuse degenerative disc disease from C3-C4 through C6-C7. C3-C4 on colo CIS may be degenerative or congenital. Upper chest: Assessed on concurrent chest CT, reported separately. Other: None. IMPRESSION: No acute fracture or subluxation. Multilevel degenerative disc disease. Electronically Signed   By: Keith Rake M.D.   On: 09/27/2022 23:08   CT HEAD WO CONTRAST  Result Date: 09/27/2022 CLINICAL DATA:  Head trauma, moderate-severe Multiple ground level falls, on anticoagulation. EXAM: CT HEAD WITHOUT CONTRAST TECHNIQUE: Contiguous axial images were obtained from the base of the skull through the vertex without intravenous contrast. RADIATION DOSE REDUCTION: This exam was performed according to the departmental dose-optimization program which includes automated exposure control, adjustment of the mA and/or kV according to patient size and/or use of iterative reconstruction technique. COMPARISON:  Head CT and brain MRI 06/30/2017 FINDINGS: Brain: No acute intracranial hemorrhage. No subdural or extra-axial collection. Moderate generalized atrophy. Left temporoparietal encephalomalacia, remote but new from 2018. No hydrocephalus. No midline shift or mass effect. Vascular: Atherosclerosis of skullbase  vasculature without hyperdense vessel or abnormal calcification. Skull: No fracture or focal lesion. Sinuses/Orbits: No acute findings. Other: Mild left frontal scalp edema. IMPRESSION: 1. Mild left frontal scalp edema. No acute intracranial abnormality. No skull fracture. 2. Moderate atrophy. Left temporoparietal encephalomalacia, likely remote ischemia, however new from 2018. Electronically Signed   By: Keith Rake M.D.   On: 09/27/2022 23:03   CT CHEST ABDOMEN PELVIS W CONTRAST  Result Date: 09/27/2022 CLINICAL DATA:  Multiple recent falls while on blood thinners, initial encounter EXAM: CT CHEST, ABDOMEN, AND PELVIS WITH CONTRAST TECHNIQUE: Multidetector CT imaging of the chest, abdomen and pelvis was performed following the standard protocol during bolus administration of intravenous contrast. RADIATION DOSE REDUCTION: This exam was performed according to the departmental dose-optimization program which includes automated exposure control, adjustment of the mA and/or kV according to patient size and/or use of iterative reconstruction technique. CONTRAST:  90m OMNIPAQUE IOHEXOL 350 MG/ML SOLN COMPARISON:  08/27/2019 FINDINGS: CT CHEST FINDINGS Cardiovascular: Atherosclerotic calcifications of the thoracic aorta are noted. Coronary calcifications are seen as well as changes of prior coronary bypass grafting. No cardiac enlargement is seen. The pulmonary artery shows no filling defect although not timed for true embolus evaluation. Mediastinum/Nodes: Thoracic inlet is within normal limits. No hilar or mediastinal adenopathy is noted. The esophagus  as visualized is within normal limits. 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 No focal infiltrate or sizable effusion is noted. No sizable parenchymal nodule is seen. Musculoskeletal: Degenerative changes of the thoracic spine are noted. No acute rib abnormality is seen. CT ABDOMEN PELVIS FINDINGS Hepatobiliary: Fatty infiltration of the liver is noted.  Mild nodularity of the liver is noted as well. No focal mass is seen. Gallstones are seen within the gallbladder without complicating factors. Pancreas: Unremarkable. No pancreatic ductal dilatation or surrounding inflammatory changes. Spleen: Normal in size without focal abnormality. 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 This measures approximately 11 mm. The kidneys show no renal calculi or obstructive changes. Normal enhancement is noted bilaterally. The bladder is well distended. Stomach/Bowel: Scattered diverticular change of the colon is noted without evidence of diverticulitis. No obstructive changes are seen. The appendix is within normal limits. Small bowel and stomach are unremarkable. Vascular/Lymphatic: Aortic atherosclerosis. No enlarged abdominal or pelvic lymph nodes. Reproductive: Prostate is enlarged. Other: No abdominal wall hernia or abnormality. No abdominopelvic ascites. Musculoskeletal: Degenerative changes of lumbar spine are seen. IMPRESSION: CT of the chest: No acute intrathoracic abnormality is noted. 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 No follow-up imaging is recommended. JACR 2017 Aug; 14(8):1038-44, JCAT 2016 Mar-Apr; 40(2):194-200, Urol J 2006 Spring; 3(2):71-4. Diverticulosis without diverticulitis. Cholelithiasis without complicating factors. No acute abnormality to correspond with the given clinical history. Electronically Signed   By: Inez Catalina M.D.   On: 09/27/2022 23:01   DG Pelvis Portable  Result Date: 09/27/2022 CLINICAL DATA:  Trauma, fall. EXAM: PORTABLE PELVIS 1-2 VIEWS COMPARISON:  None Available. FINDINGS: The cortical margins of the bony pelvis are intact. No fracture. Pubic symphysis and sacroiliac joints are congruent. Both femoral heads are well-seated in the respective acetabula. IMPRESSION: No pelvic  fracture. Electronically Signed   By: Keith Rake M.D.   On: 09/27/2022 22:30   DG Chest Port 1 View  Result Date: 09/27/2022 CLINICAL DATA:  Trauma, fall. EXAM: PORTABLE CHEST 1 VIEW COMPARISON:  Radiograph 08/18/2021 FINDINGS: Prior median sternotomy and CABG.The cardiomediastinal contours are normal. The lungs are clear. Pulmonary vasculature is normal. No consolidation, pleural effusion, or pneumothorax. No acute osseous abnormalities are seen. Left chest wall loop recorder. IMPRESSION: No acute chest findings or evidence of traumatic injury. Electronically Signed   By: Keith Rake M.D.   On: 09/27/2022 22:29    Pending Labs Unresulted Labs (From admission, onward)     Start     Ordered   09/28/22 5427  Basic metabolic panel  Daily,   R      09/28/22 0203   09/28/22 0500  CBC  Daily,   R      09/28/22 0203   09/28/22 0157  Urea nitrogen, urine  Once,   R        09/28/22 0203            Vitals/Pain Today's Vitals   09/28/22 1515 09/28/22 1517 09/28/22 1539 09/28/22 1545  BP:    134/88  Pulse:    (!) 55  Resp:    18  Temp:   98.6 F (37 C)   TempSrc:   Oral   SpO2: 96%   95%  Weight:      Height:      PainSc:  Asleep      Isolation Precautions No active isolations  Medications Medications  tamsulosin (FLOMAX) capsule 0.4 mg (0.4 mg Oral Given 09/28/22 1259)  apixaban (ELIQUIS) tablet 5 mg (5 mg Oral Given 09/28/22 1259)  isosorbide mononitrate (IMDUR) 24 hr tablet 60 mg (60 mg Oral Given 09/28/22 1258)  metoprolol tartrate (LOPRESSOR) tablet 37.5 mg (37.5 mg Oral Given 09/28/22 1258)  insulin aspart (novoLOG) injection 0-6 Units (1 Units Subcutaneous Given 09/28/22 1255)  insulin aspart (novoLOG) injection 0-5 Units (has no administration in time range)  sodium chloride flush (NS) 0.9 % injection 3 mL (3 mLs Intravenous Given 09/28/22 1259)  acetaminophen (TYLENOL) tablet 650 mg (has no administration in time range)    Or  acetaminophen (TYLENOL)  suppository 650 mg (has no administration in time range)  0.9 %  sodium chloride infusion ( Intravenous New Bag/Given 09/28/22 1259)  LORazepam (ATIVAN) injection 0.5 mg (0.5 mg Intravenous Given 09/28/22 0233)  iohexol (OMNIPAQUE) 350 MG/ML injection 75 mL (75 mLs Intravenous Contrast Given 09/27/22 2243)  sodium chloride 0.9 % bolus 1,000 mL (0 mLs Intravenous Stopped 09/28/22 0203)  magnesium sulfate IVPB 2 g 50 mL (0 g Intravenous Stopped 09/28/22 0105)    Mobility walks High fall risk   Focused Assessments Neuro Assessment Handoff:  Swallow screen pass? N/A Cardiac Rhythm: Normal sinus rhythm       Neuro Assessment:   Neuro Checks:      Last Documented NIHSS Modified Score:   Has TPA been given? No If patient is a Neuro Trauma and patient is going to OR before floor call report to [place] nurse: [phone] or [phone]  , Renal Assessment Handoff:    R Recommendations: See Admitting Provider Note  Report given to:   Additional Notes: pt has not started HD yet

## 2022-09-28 NOTE — Assessment & Plan Note (Addendum)
No clinical signs of acute heart failure decompensation.  Echocardiogram 2022 with preserved LV systolic function with EF 60 to 65%, mild LVH, RV systolic function preserved. LA with mild to moderate dilatation.   Urine output is  1,450 ml over last 24 hrs Systolic blood pressure 160 to 140 mmHg.   Continue isosorbide and amlodipine for blood pressure control. Resume hydralazine bid.  Continue to hold on diuretic therapy (at home patient on torsemide and chlorthalidone).  No ACE or ARB due to very low GFR.

## 2022-09-28 NOTE — Progress Notes (Signed)
Progress Note   Patient: Jake Gross ONG:[id number] DOB: [birth year] DOA: 09/27/2022     0 DOS: the patient was seen and examined on 09/28/2022   Brief hospital course: Mr. Seufert was admitted to the hospital with the working diagnosis of renal failure.   81 yo male with the past medical history of coronary artery disease, heart failure, T2DM, paroxysmal atrial fibrillation, MGUS, and CKD stage 4 who presented with generalized weakness, lightheadedness and frequent falls. Worsening symptoms for the last 4 days, associated with dyspnea with exertion. On his initial physical examination his blood pressure was 134/76, HR 52, RR 18 and 02 saturation 97%, lungs with no wheezing or rales, heart with S1 and S2 present and rhythmic, abdomen with no distention, no lower extremity edema.   Na 137, K 4.3 CL 96 bicarbonate 20 glucose 286 bun 100 cr 3,9  Lactic acid 4,0 Wbc 9,8 hgb 15.2 plt 265   Urine analysis SG 1,019, 100 protein, pH 5.0 negative leukocytes, 0-5 rbc   Chest radiograph with no infiltrates, mild cardiomegaly.  Head CT with mild frontal scalp edema. No acute intracranial changes.  Left frontotemporal encephalomalacia due to remote ischemia.  Cervical CT with no acute changes.  CT chest abdomen and pelvis with no acute changes.   EKG 56 bpm, normal axis, normal intervals, manually corrected qtc 0.480, sinus rhythm with poor R R wave progression and LVH, no significant ST segment or T wave changes.   Assessment and Plan: * Acute renal failure 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 (HCC) Hypokalemia   Renal function with serum cr at 3,6 with K at 3,3, bicarbonate 20, Na 139, BUN 95, Cl 103  Plan to continue hydration with isotonic saline, decrease rate to 50 ml per hr Follow up renal function in am, avoid hypotension and nephrotoxic medications.   Chronic diastolic CHF (congestive heart failure) (HCC) No clinical signs of heart failure exacerbation. Continue blood  pressure monitoring Decrease IV fluids to 50 ml per hr  Paroxysmal atrial fibrillation (HCC) Patient on sinus rhythm, continue rate control Continue metoprolol and anticoagulation with apixaban.   Diabetes mellitus type 2, insulin dependent ([place]) Continue glucose cover and monitoring with insulin sliding scale.  Patient with very poor oral intake.   History of CVA (cerebrovascular accident) Continue blood pressure control Follow up with Pt and Ot  CAD (coronary artery disease) No active chest pain or angina. Continue blood pressure control.   Essential hypertension Continue blood pressure control with isosorbide.         Subjective: Patient with no chest pain, mild dyspnea, continue to be very weak and deconditioned   Physical Exam: Vitals:   09/28/22 1500 09/28/22 1515 09/28/22 1539 09/28/22 1545  BP: 134/65   134/88  Pulse: (!) 55   (!) 55  Resp: 20   18  Temp:   98.6 F (37 C)   TempSrc:   Oral   SpO2: 94% 96%  95%  Weight:      Height:       Neurology awake and alert ENT with mild pallor Cardiovascular with S1 and S2 present and rhythmic with no gallops, rubs or murmurs Respiratory with no rales or wheezing Abdomen with no distention  No lower extremity edema  Data Reviewed:    Family Communication: no family at the bedside   Disposition: Status is: Inpatient Remains inpatient appropriate because: Iv fluids and close renal function monitoring   Planned Discharge Destination: Home    Author: Jimmy Picket  Dominion Kathan, MD 09/28/2022 4:51 PM  For on call review [email or web address].

## 2022-09-28 NOTE — Assessment & Plan Note (Addendum)
Uncontrolled with hyperglycemia. Fasting glucose 207 and preprandial 201   Resume basal insulin with 10 units. (At home patient on 60 units).  Continue glucose cover and monitoring with insulin sliding scale.

## 2022-09-29 LAB — GLUCOSE, CAPILLARY
Glucose-Capillary: 198 mg/dL — ABNORMAL HIGH (ref 70–99)
Glucose-Capillary: 185 mg/dL — ABNORMAL HIGH (ref 70–99)
Glucose-Capillary: 279 mg/dL — ABNORMAL HIGH (ref 70–99)
Glucose-Capillary: 210 mg/dL — ABNORMAL HIGH (ref 70–99)

## 2022-09-29 LAB — RENAL FUNCTION PANEL
Albumin: 2.9 g/dL — ABNORMAL LOW (ref 3.5–5.0)
Anion gap: 11 (ref 5–15)
BUN: 88 mg/dL — ABNORMAL HIGH (ref 8–23)
CO2: 23 mmol/L (ref 22–32)
Calcium: 8.5 mg/dL — ABNORMAL LOW (ref 8.9–10.3)
Chloride: 105 mmol/L (ref 98–111)
Creatinine, Ser: 2.92 mg/dL — ABNORMAL HIGH (ref 0.61–1.24)
GFR, Estimated: 21 mL/min — ABNORMAL LOW (ref >60)
Glucose, Bld: 152 mg/dL — ABNORMAL HIGH (ref 70–99)
Phosphorus: 4.6 mg/dL (ref 2.5–4.6)
Potassium: 3.6 mmol/L (ref 3.5–5.1)
Sodium: 139 mmol/L (ref 135–145)

## 2022-09-29 LAB — UREA NITROGEN, URINE: Urea Nitrogen, Ur: 440 mg/dL

## 2022-09-29 MED ORDER — METOPROLOL TARTRATE 25 MG PO TABS
25.0000 mg | ORAL_TABLET | Freq: Two times a day (BID) | ORAL | Status: DC
  Administered 2022-09-29 (×2): 25 mg via ORAL
  Filled 2022-09-29 (×3): qty 1

## 2022-09-29 MED ORDER — OXYCODONE HCL 5 MG PO TABS
5.0000 mg | ORAL_TABLET | Freq: Four times a day (QID) | ORAL | Status: DC | PRN
  Administered 2022-09-29 – 2022-09-30 (×3): 5 mg via ORAL
  Filled 2022-09-29 (×3): qty 1

## 2022-09-29 NOTE — TOC Initial Note (Addendum)
Transition of Care [facility]) - Initial/Assessment Note    Patient Details  Name: Jake Gross MRN: [id number] Date of Birth: Sep 19, 1941  Transition of Care [facility]) CM/SW Contact:    Milas Gain, [place] Phone Number: 09/29/2022, 4:10 PM  Clinical Narrative:                  CSW received consult for possible SNF placement at time of discharge. CSW spoke with patient at bedside regarding PT recommendation of SNF placement at time of discharge. Patient reports he comes from home alone. Patient expressed understanding of PT recommendation and is agreeable to SNF placement at time of discharge. Patient gave CSW permission to fax out initial referral near the [place] area for possible SNF placement. CSW discussed insurance authorization process and will provide Medicare SNF ratings list with accepted SNF bed offers when available. Patient expressed being hopeful for rehab and to feel better soon. No further questions reported at this time. CSW to continue to follow and assist with discharge planning needs.   Expected Discharge Plan: Skilled Nursing Facility Barriers to Discharge: Continued Medical Work up   Patient Goals and CMS Choice Patient states their goals for this hospitalization and ongoing recovery are:: SNF CMS Medicare.gov Compare Post Acute Care list provided to:: Patient Choice offered to / list presented to : Patient  Expected Discharge Plan and Services Expected Discharge Plan: [place] In-house Referral: Clinical Social Work     Living arrangements for the past 2 months: [place]                                      Prior Living Arrangements/Services Living arrangements for the past 2 months: Single Family Home Lives with:: Self Patient language and need for interpreter reviewed:: Yes Do you feel safe going back to the place where you live?: No   SNF  Need for Family Participation in Patient Care: Yes  (Comment) Care giver support system in place?: Yes (comment)   Criminal Activity/Legal Involvement Pertinent to Current Situation/Hospitalization: No - Comment as needed  Activities of Daily Living      Permission Sought/Granted Permission sought to share information with : Case Manager, Family Supports, Customer service manager Permission granted to share information with : Yes, Verbal Permission Granted  Share Information with NAME: Marita [place]  Permission granted to share info w AGENCY: SNF  Permission granted to share info w Relationship: daughter  Permission granted to share info w Contact Information: Marita [place] [phone]  Emotional Assessment Appearance:: Appears stated age Attitude/Demeanor/Rapport: Gracious Affect (typically observed): Calm Orientation: : Oriented to Self, Oriented to Place, Oriented to  Time, Oriented to Situation Alcohol / Substance Use: Not Applicable Psych Involvement: No (comment)  Admission diagnosis:  Lactic acidosis [E87.20] AKI (acute kidney injury) ([place]) [N17.9] Increased anion gap metabolic acidosis [L97.67] Injury of head, initial encounter [S09.90XA] Fall, initial encounter [W19.XXXA] Acute renal failure 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 ([place]) [N17.9, N18.4] Syncope, unspecified syncope type [R55] Dyspnea, unspecified type [R06.00] Patient Active Problem List   Diagnosis Date Noted   Acute renal failure 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 ([place]) 09/28/2022   Elevated troponin 09/28/2022   Lactic acidosis 09/28/2022   Prolonged QT interval 09/28/2022   Medicare annual wellness visit, subsequent 10/29/2021   Abnormal SPEP 09/12/2021   Shortness of breath 06/27/2021   Abscess 06/27/2021   History of anemia  06/27/2021   Nocturia 06/27/2021   History of GI bleed 09/29/2019   CAD (coronary artery disease) 09/29/2019   AVM (arteriovenous malformation) of small bowel, 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   Iron  deficiency anemia due to chronic blood loss    Chronic anticoagulation    History of CVA (cerebrovascular accident) 08/25/2019   Chronic diastolic CHF (congestive heart failure) ([place]) 12/17/2018   Paroxysmal atrial fibrillation ([place]) 12/17/2018   CKD (chronic kidney disease) 12/17/2018   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

## 2022-09-29 NOTE — Evaluation (Signed)
Physical Therapy Evaluation Patient Details Name: Jake Gross MRN: [id number] DOB: [birth year] Today's Date: 09/29/2022  History of Present Illness  Patient is a 81 y/o male who presents on 12/13 with SOB, multiple falls, generalized weakness and fatigue. Found to have AKI superimposed on CKD stage 4. 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  Clinical Impression  Patient presents with generalized weakness, SOB, headache, impaired balance, dizziness, decreased activity tolerance and impaired mobility s/p above. Pt is independent and lives alone PTA. Reports no recent falls except for the 3 leading to admission- not sure if he passed out or his LEs "gave out." Pt noted to have orthostatic hypotension today. See below. Supine BP 139/58 Sitting BP 133/68 Standing BP 111/56 Dizziness present in all positions. RN notified. Pt complained of SOB during session; Sp02 dropped to 88% for a few seconds when taking steps along side the bed but bounced back quickly when seated. Reports he cannot get a deep breath the last few days. Encouraged sitting up for all meals and increasing activity. Might benefit from ted hose. Not safe to return home alone at this time so recommending SNF to maximize independence and mobility prior to return home. Will follow acutely.     Recommendations for follow up therapy are one component of a multi-disciplinary discharge planning process, led by the attending physician.  Recommendations may be updated based on patient status, additional functional criteria and insurance authorization.  Follow Up Recommendations Skilled nursing-short term rehab (<3 hours/day) Can patient physically be transported by private vehicle: No    Assistance Recommended at Discharge Frequent or constant Supervision/Assistance  Patient can return home with the following  A lot of help with walking and/or transfers;A little help with  bathing/dressing/bathroom;Assistance with cooking/housework;Assist for transportation;Help with stairs or ramp for entrance    Equipment Recommendations Rolling walker (2 wheels)  Recommendations for Other Services       Functional Status Assessment Patient has had a recent decline in their functional status and demonstrates the ability to make significant improvements in function in a reasonable and predictable amount of time.     Precautions / Restrictions Precautions Precautions: Fall;Other (comment) Precaution Comments: watch 02 Restrictions Weight Bearing Restrictions: No      Mobility  Bed Mobility Overal bed mobility: Needs Assistance Bed Mobility: Supine to Sit, Sit to Supine     Supine to sit: HOB elevated, Min assist Sit to supine: Min guard   General bed mobility comments: Assist with trunk to get to EOB, use of rail. ABle to bring LEs into bed to return to supine. Dizziness noted the longer pt sat EOB.    Transfers Overall transfer level: Needs assistance Equipment used: Rolling walker (2 wheels) Transfers: Sit to/from Stand Sit to Stand: Mod assist, From elevated surface           General transfer comment: Heavy Mod A to power to standing with cues for hand placement/technique.    Ambulation/Gait Ambulation/Gait assistance: Min assist Gait Distance (Feet): 3 Feet Assistive device: Rolling walker (2 wheels)   Gait velocity: decreased     General Gait Details: Able to take a few side steps along side bed with min A for balance and RW management. Sp02 dropped to 88% but quickly bounced back to >92% withr est. 2-3/4 DOE. heavy reliance on UEs, bil knee instability.  Stairs            Wheelchair Mobility    Modified Rankin (Stroke  Patients Only)       Balance Overall balance assessment: History of Falls, Needs assistance Sitting-balance support: Feet supported, No upper extremity supported Sitting balance-Leahy Scale: Good Sitting balance  - Comments: Able to reach outside BoS without difficulty.   Standing balance support: During functional activity, Bilateral upper extremity supported, Reliant on assistive device for balance Standing balance-Leahy Scale: Poor Standing balance comment: Relies on UE support and external support, bil knee instability/weakness. Dizziness.                             Pertinent Vitals/Pain Pain Assessment Pain Assessment: Faces Faces Pain Scale: Hurts little more Pain Location: head Pain Descriptors / Indicators: Headache Pain Intervention(s): Monitored during session, Repositioned, Limited activity within patient's tolerance    Home Living Family/patient expects to be discharged to:: Private residence Living Arrangements: Alone Available Help at Discharge: Family;Available PRN/intermittently Type of Home: House Home Access: Stairs to enter Entrance Stairs-Rails: Right Entrance Stairs-Number of Steps: 5   Home Layout: One level Home Equipment: Cane - single point      Prior Function Prior Level of Function : Independent/Modified Independent             Mobility Comments: independent, drives, cooks/cleans ADLs Comments: independent     Hand Dominance   Dominant Hand: Right    Extremity/Trunk Assessment   Upper Extremity Assessment Upper Extremity Assessment: Defer to OT evaluation    Lower Extremity Assessment Lower Extremity Assessment: Generalized weakness;RLE deficits/detail;LLE deficits/detail RLE Sensation: decreased light touch (foot) LLE Sensation: decreased light touch (foot)    Cervical / Trunk Assessment Cervical / Trunk Assessment: Normal  Communication   Communication: No difficulties  Cognition Arousal/Alertness: Awake/alert Behavior During Therapy: WFL for tasks assessed/performed Overall Cognitive Status: Within Functional Limits for tasks assessed                                 General Comments: seems to be  appropriate for questions asked and basic mobility, oriented x4        General Comments General comments (skin integrity, edema, etc.): Supine BP 139/58, sitting BP 133/68, standing BP 111/56. Dizziness present in all positions. RN notified.    Exercises     Assessment/Plan    PT Assessment Patient needs continued PT services  PT Problem List Decreased strength;Decreased mobility;Decreased balance;Decreased knowledge of use of DME;Pain;Impaired sensation;Cardiopulmonary status limiting activity;Decreased activity tolerance       PT Treatment Interventions Therapeutic exercise;Gait training;Stair training;Patient/family education;Therapeutic activities;Functional mobility training;Balance training;DME instruction    PT Goals (Current goals can be found in the Care Plan section)  Acute Rehab PT Goals Patient Stated Goal: to get stronger PT Goal Formulation: With patient Time For Goal Achievement: 10/13/22 Potential to Achieve Goals: Good    Frequency Min 3X/week     Co-evaluation               AM-PAC PT "6 Clicks" Mobility  Outcome Measure Help needed turning from your back to your side while in a flat bed without using bedrails?: A Little Help needed moving from lying on your back to sitting on the side of a flat bed without using bedrails?: A Little Help needed moving to and from a bed to a chair (including a wheelchair)?: A Little Help needed standing up from a chair using your arms (e.g., wheelchair or bedside chair)?: A Lot Help needed to  walk in hospital room?: Total Help needed climbing 3-5 steps with a railing? : Total 6 Click Score: 13    End of Session Equipment Utilized During Treatment: Gait belt Activity Tolerance: Patient limited by fatigue;Treatment limited secondary to medical complications (Comment) (orthostasis) Patient left: in bed;with call bell/phone within reach;with bed alarm set Nurse Communication: Mobility status;Other (comment) (BP) PT  Visit Diagnosis: Pain;Muscle weakness (generalized) (M62.81);Difficulty in walking, not elsewhere classified (R26.2);Unsteadiness on feet (R26.81) Pain - part of body:  (head)    Time: 4114-6431 PT Time Calculation (min) (ACUTE ONLY): 34 min   Charges:   PT Evaluation $PT Eval Moderate Complexity: 1 Mod PT Treatments $Therapeutic Activity: 8-22 mins        Marisa Severin, PT, DPT Acute Rehabilitation Services Secure chat preferred Office [place] 09/29/2022, 3:08 PM

## 2022-09-29 NOTE — Progress Notes (Signed)
  Transition of Care [facility]) Screening Note   Patient Details  Name: Jake Gross Date of Birth: 1941-02-19   Transition of [facility]) CM/SW Contact:    Bartholomew Crews, RN Phone Number: [phone] 09/29/2022, 8:54 AM    Transition of Care Department [facility]) has reviewed patient and no TOC needs have been identified at this time. We will continue to monitor patient advancement through interdisciplinary progression rounds. If new patient transition needs arise, please place a TOC consult.

## 2022-09-29 NOTE — Progress Notes (Signed)
Progress Note   Patient: Jake Gross QMG:[id number] DOB: 1941/06/26 DOA: 09/27/2022     1 DOS: the patient was seen and examined on 09/29/2022   Brief hospital course: Mr. Lamagna was admitted to the hospital with the working diagnosis of renal failure.   81 yo male with the past medical history of coronary artery disease, heart failure, T2DM, paroxysmal atrial fibrillation, MGUS, and CKD stage 4 who presented with generalized weakness, lightheadedness and frequent falls. Worsening symptoms for the last 4 days, associated with dyspnea with exertion. On his initial physical examination his blood pressure was 134/76, HR 52, RR 18 and 02 saturation 97%, lungs with no wheezing or rales, heart with S1 and S2 present and rhythmic, abdomen with no distention, no lower extremity edema.   Na 137, K 4.3 CL 96 bicarbonate 20 glucose 286 bun 100 cr 3,9  Lactic acid 4,0 Wbc 9,8 hgb 15.2 plt 265   Urine analysis SG 1,019, 100 protein, pH 5.0 negative leukocytes, 0-5 rbc   Chest radiograph with no infiltrates, mild cardiomegaly.  Head CT with mild frontal scalp edema. No acute intracranial changes.  Left frontotemporal encephalomalacia due to remote ischemia.  Cervical CT with no acute changes.  CT chest abdomen and pelvis with no acute changes.   EKG 56 bpm, normal axis, normal intervals, manually corrected qtc 0.480, sinus rhythm with poor R R wave progression and LVH, no significant ST segment or T wave changes.   12/05 renal function is improving, continue to be very weak and deconditioned.   Assessment and Plan: * Acute renal failure 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 (HCC) Hypokalemia   Renal function today with serum cr at 2,92 with K at 3,6 and serum bicarbonate at 23. Na 139 and CL 105   Discontinue IV fluids and continue to encourage po intake.  Follow up renal function in am, avoid hypotension and nephrotoxic medications.   Chronic diastolic CHF (congestive heart  failure) (HCC) No clinical signs of acute heart failure decompensation.  Echocardiogram 2022 with preserved LV systolic function with EF 60 to 65%, mild LVH, RV systolic function preserved. LA with mild to moderate dilatation.   Urine output is 1,100 ml over last 24 hrs Systolic blood pressure 326 to 170 mmHg.   Plant to continue metoprolol for atria fibrillation rate control. On isosorbide.     Paroxysmal atrial fibrillation (HCC) Patient on sinus rhythm, continue rate control Continue metoprolol and anticoagulation with apixaban.  Decrease metoprolol to 25 mg bid to prevent worsening bradycardia.   Diabetes mellitus type 2, insulin dependent (HCC) Uncontrolled with hyperglycemia. Capillary glucose has been 244, 199, 198   Fasting glucose today is 152 mg/dl.  Continue glucose cover and monitoring with insulin sliding scale.  Patient is tolerating po well.   History of CVA (cerebrovascular accident) Continue blood pressure control Follow up with Pt and Ot Patient with multiple falls at home, positive head trauma and headache. Add oxycodone for pain control.  Chronic left fronto temporal encephalomalacia.   CAD (coronary artery disease) No active chest pain or angina. Continue blood pressure control.   Essential hypertension Continue blood pressure control with isosorbide and metoprolol.         Subjective: Patient with no chest pain or dyspnea, he has headache and right ear pain, continue to be very weak and deconditioned   Physical Exam: Vitals:   09/29/22 0331 09/29/22 0332 09/29/22 0752 09/29/22 0858  BP:  (!) 154/63 (!) 147/79   Pulse:  Marland Kitchen)  49 60 (!) 59  Resp:  18 16   Temp:  97.7 F (36.5 C) (!) 97.1 F (36.2 C)   TempSrc:  Oral Oral   SpO2:  94% 95%   Weight: 84.5 kg     Height:       Neurology awake and alert ENT with mild pallor, oral mucosa moist Cardiovascular with S1 and S2 present and rhythmic with no gallops, rubs or murmurs,  bradycardia Respiratory with no rales or wheezing, no rhonchi Abdomen with no distention  No lower extremity edema  Data Reviewed:    Family Communication: no family at the bedside   Disposition: Status is: Inpatient Remains inpatient appropriate because: monitoring renal function and PT/OT   Planned Discharge Destination: Home    Author: Tawni Millers, MD 09/29/2022 10:35 AM  For on call review [email or web address].

## 2022-09-29 NOTE — [place] FL2 (Signed)
[place] MEDICAID FL2 LEVEL OF CARE FORM     IDENTIFICATION  Patient Name: Jake Gross Birthdate: November 26, 1940 Sex: male Admission Date (Current Location): 09/27/2022  [facility] and [place] Number:  Herbalist and Address:  [facility], [place] [address] [place], [place] [address]      Provider Number: [id number]  Attending Physician Name and Address:  Tawni Millers,*  Relative Name and Phone Number:  Marita [place] (daughter) [phone]    Current Level of Care: Hospital Recommended Level of Care: [place] Prior Approval Number:    Date Approved/Denied:   PASRR Number: [id number]  Discharge Plan: SNF    Current Diagnoses: Patient Active Problem List   Diagnosis Date Noted   Acute renal failure 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 ([place]) 09/28/2022   Elevated troponin 09/28/2022   Lactic acidosis 09/28/2022   Prolonged QT interval 09/28/2022   Medicare annual wellness visit, subsequent 10/29/2021   Abnormal SPEP 09/12/2021   Shortness of breath 06/27/2021   Abscess 06/27/2021   History of anemia 06/27/2021   Nocturia 06/27/2021   History of GI bleed 09/29/2019   CAD (coronary artery disease) 09/29/2019   AVM (arteriovenous malformation) of small bowel, 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   Iron deficiency anemia due to chronic blood loss    Chronic anticoagulation    History of CVA (cerebrovascular accident) 08/25/2019   Chronic diastolic CHF (congestive heart failure) ([place]) 12/17/2018   Paroxysmal atrial fibrillation ([place]) 12/17/2018   CKD (chronic kidney disease) 12/17/2018   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   Unstable angina (HCC)    Stroke (cerebrum) ([place]) 06/20/2017   HLD (hyperlipidemia) 09/14/2015   Essential hypertension    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    Arthritis 09/16/2012   Diabetes mellitus type 2, insulin dependent ([place]) 08/18/2011     Orientation RESPIRATION BLADDER Height & Weight     Self, Time, Situation, Place  Normal Incontinent Weight: 186 lb 4.6 oz (84.5 kg) Height:  '5\' 7"'$  (170.2 cm)  BEHAVIORAL SYMPTOMS/MOOD NEUROLOGICAL BOWEL NUTRITION STATUS      Continent (WDL) Diet (Please see discharge summary)  AMBULATORY STATUS COMMUNICATION OF NEEDS Skin   Limited Assist Verbally Other (Comment) (Appropriate for ethnicity,dry,flaky,Abrasion,knee,Bilateral,foam,Wound/Incision LDAs)                       Personal Care Assistance Level of Assistance  Bathing, Feeding, Dressing Bathing Assistance: Limited assistance Feeding assistance: Independent Dressing Assistance: Limited assistance     Functional Limitations Info  Sight, Hearing, Speech Sight Info: Adequate (WDL) Hearing Info: Adequate Speech Info: Adequate    SPECIAL CARE FACTORS FREQUENCY  PT (By licensed PT), OT (By licensed OT)     PT Frequency: 5x min weekly OT Frequency: 5x min weekly            Contractures Contractures Info: Not present    Additional Factors Info  Code Status, Allergies, Insulin Sliding Scale Code Status Info: FULL Allergies Info: NKA   Insulin Sliding Scale Info: insulin aspart (novoLOG) injection 0-5 Units daily at bedtime,insulin aspart (novoLOG) injection 0-6 Units 3 times daily with meals       Current Medications (09/29/2022):  This is the current hospital active medication list Current Facility-Administered Medications  Medication Dose Route Frequency Provider Last Rate Last Admin   acetaminophen (TYLENOL) tablet 650 mg  650 mg Oral Q6H PRN Opyd, Ilene Qua, MD  Or   acetaminophen (TYLENOL) suppository 650 mg  650 mg Rectal Q6H PRN Opyd, Ilene Qua, MD       apixaban (ELIQUIS) tablet 5 mg  5 mg Oral BID Opyd, Ilene Qua, MD   5 mg at 09/29/22 0859   insulin aspart (novoLOG) injection 0-5 Units  0-5 Units Subcutaneous QHS Vianne Bulls, MD   2 Units at 09/28/22 2241   insulin aspart (novoLOG)  injection 0-6 Units  0-6 Units Subcutaneous TID WC Opyd, Ilene Qua, MD   1 Units at 09/29/22 1248   isosorbide mononitrate (IMDUR) 24 hr tablet 60 mg  60 mg Oral Daily Opyd, Ilene Qua, MD   60 mg at 09/29/22 0859   metoprolol tartrate (LOPRESSOR) tablet 25 mg  25 mg Oral BID Tawni Millers, MD   25 mg at 09/29/22 4098   oxyCODONE (Oxy IR/ROXICODONE) immediate release tablet 5 mg  5 mg Oral Q6H PRN Arrien, Jimmy Picket, MD   5 mg at 09/29/22 1551   sodium chloride flush (NS) 0.9 % injection 3 mL  3 mL Intravenous Q12H Opyd, Ilene Qua, MD   3 mL at 09/29/22 0925   tamsulosin (FLOMAX) capsule 0.4 mg  0.4 mg Oral Daily Opyd, Ilene Qua, MD   0.4 mg at 09/29/22 1191     Discharge Medications: Please see discharge summary for a list of discharge medications.  Relevant Imaging Results:  Relevant Lab Results:   Additional Information [id number]  Milas Gain, LCSWA

## 2022-09-29 NOTE — Progress Notes (Signed)
   09/28/22 2030  Assess: if the MEWS score is Yellow or Red  Were vital signs taken at a resting state? Yes  Focused Assessment No change from prior assessment  Does the patient meet 2 or more of the SIRS criteria? No  Does the patient have a confirmed or suspected source of infection? No  MEWS guidelines implemented *See Row Information* Yes  Treat  MEWS Interventions Other (Comment) (hold night metoprolol)  Pain Scale 0-10  Pain Score 0  Take Vital Signs  Increase Vital Sign Frequency  Yellow: Q 2hr X 2 then Q 4hr X 2, if remains yellow, continue Q 4hrs  Escalate  MEWS: Escalate Yellow: discuss with charge nurse/RN and consider discussing with provider and RRT  Notify: Charge Nurse/RN  Name of Charge Nurse/RN Notified [place]  Date Charge Nurse/RN Notified 09/28/22  Time Charge Nurse/RN Notified 2030  Provider Notification  Provider Name/Title Quintella Baton  Date Provider Notified 09/28/22  Time Provider Notified 2030  Method of Notification Page (secure chat)  Notification Reason Change in status (SB 30-50's)  Provider response See new orders  Date of Provider Response 09/28/22  Time of Provider Response 2033  Notify: Rapid Response  Name of Rapid Response RN Notified na   Patient asymptomatic, not in distress and resting  comfortably in bed, will continue to monitor

## 2022-09-30 DIAGNOSIS — N179 Acute kidney failure, unspecified: Secondary | ICD-10-CM | POA: Diagnosis not present

## 2022-09-30 DIAGNOSIS — I480 Paroxysmal atrial fibrillation: Secondary | ICD-10-CM | POA: Diagnosis not present

## 2022-09-30 DIAGNOSIS — I5032 Chronic diastolic (congestive) heart failure: Secondary | ICD-10-CM | POA: Diagnosis not present

## 2022-09-30 DIAGNOSIS — E119 Type 2 diabetes mellitus without complications: Secondary | ICD-10-CM | POA: Diagnosis not present

## 2022-09-30 LAB — BASIC METABOLIC PANEL
Anion gap: 12 (ref 5–15)
BUN: 73 mg/dL — ABNORMAL HIGH (ref 8–23)
CO2: 24 mmol/L (ref 22–32)
Calcium: 8.5 mg/dL — ABNORMAL LOW (ref 8.9–10.3)
Chloride: 102 mmol/L (ref 98–111)
Creatinine, Ser: 2.7 mg/dL — ABNORMAL HIGH (ref 0.61–1.24)
GFR, Estimated: 23 mL/min — ABNORMAL LOW (ref >60)
Glucose, Bld: 176 mg/dL — ABNORMAL HIGH (ref 70–99)
Potassium: 4.1 mmol/L (ref 3.5–5.1)
Sodium: 138 mmol/L (ref 135–145)

## 2022-09-30 LAB — GLUCOSE, CAPILLARY
Glucose-Capillary: 179 mg/dL — ABNORMAL HIGH (ref 70–99)
Glucose-Capillary: 212 mg/dL — ABNORMAL HIGH (ref 70–99)
Glucose-Capillary: 189 mg/dL — ABNORMAL HIGH (ref 70–99)
Glucose-Capillary: 270 mg/dL — ABNORMAL HIGH (ref 70–99)

## 2022-09-30 MED ORDER — ROSUVASTATIN CALCIUM 20 MG PO TABS
20.0000 mg | ORAL_TABLET | Freq: Every day | ORAL | Status: DC
  Administered 2022-09-30 – 2022-10-03 (×4): 20 mg via ORAL
  Filled 2022-09-30 (×4): qty 1

## 2022-09-30 MED ORDER — AMLODIPINE BESYLATE 5 MG PO TABS
5.0000 mg | ORAL_TABLET | Freq: Every day | ORAL | Status: DC
  Administered 2022-09-30 – 2022-10-03 (×4): 5 mg via ORAL
  Filled 2022-09-30 (×4): qty 1

## 2022-09-30 NOTE — Plan of Care (Signed)

## 2022-09-30 NOTE — TOC Progression Note (Signed)
Transition of Care [facility]) - Progression Note    Patient Details  Name: Jake Gross MRN: [id number] Date of Birth: Aug 25, 1941  Transition of Care [facility]) CM/SW [place], [place] Phone Number: 09/30/2022, 2:05 PM  Clinical Narrative:     CSW provided patient with SNF bed offers. Patient accepted SNF bed offer with [facility]. CSW spoke with Sharyn Lull with [facility] who confirmed SNF bed for patient. CSW to up with facility closer to patient being medically ready for dc. CSW informed MD. CSW will continue to follow and assist with patients dc planning needs.  Expected Discharge Plan: [place] Barriers to Discharge: Continued Medical Work up  Expected Discharge Plan and Services Expected Discharge Plan: [place] In-house Referral: Clinical Social Work     Living arrangements for the past 2 months: Single Family Home                                       Social Determinants of Health (SDOH) Interventions    Readmission Risk Interventions     No data to display

## 2022-09-30 NOTE — Progress Notes (Addendum)
Progress Note   Patient: Jake Gross GXQ:[id number] DOB: January 21, 1941 DOA: 09/27/2022     2 DOS: the patient was seen and examined on 09/30/2022   Brief hospital course: Jake Gross was admitted to the hospital with the working diagnosis of renal failure.   81 yo male with the past medical history of coronary artery disease, heart failure, T2DM, paroxysmal atrial fibrillation, MGUS, and CKD stage 4 who presented with generalized weakness, lightheadedness and frequent falls. Worsening symptoms for the last 4 days, associated with dyspnea with exertion. On his initial physical examination his blood pressure was 134/76, HR 52, RR 18 and 02 saturation 97%, lungs with no wheezing or rales, heart with S1 and S2 present and rhythmic, abdomen with no distention, no lower extremity edema.   Na 137, K 4.3 CL 96 bicarbonate 20 glucose 286 bun 100 cr 3,9  Lactic acid 4,0 Wbc 9,8 hgb 15.2 plt 265   Urine analysis SG 1,019, 100 protein, pH 5.0 negative leukocytes, 0-5 rbc   Chest radiograph with no infiltrates, mild cardiomegaly.  Head CT with mild frontal scalp edema. No acute intracranial changes.  Left frontotemporal encephalomalacia due to remote ischemia.  Cervical CT with no acute changes.  CT chest abdomen and pelvis with no acute changes.   EKG 56 bpm, normal axis, normal intervals, manually corrected qtc 0.480, sinus rhythm with poor R R wave progression and LVH, no significant ST segment or T wave changes.   12/15 renal function is improving, continue to be very weak and deconditioned.  12/16 patient has been bradycardic, metoprolol held. Patient will need SNF at the time of his discharge.   Assessment and Plan: * Acute renal failure 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 (HCC) Hypokalemia   IV fluid have been discontinued, patient is tolerating po well.  Follow up renal function with serum cr of 2,70 with K at 4,1 and serum bicarbonate at 24.   Continue follow up renal  function and electrolytes.  Avoid hypotension and nephrotoxic medications.   Chronic diastolic CHF (congestive heart failure) (HCC) No clinical signs of acute heart failure decompensation.  Echocardiogram 2022 with preserved LV systolic function with EF 60 to 65%, mild LVH, RV systolic function preserved. LA with mild to moderate dilatation.   Urine output is 2,460 ml over last 24 hrs Systolic blood pressure 144 to 120 mmHg.   Continue isosorbide, will hold on metoprolol for now due to bradycardia.  Resume amlodipine for blood pressure control.  Continue to hold on diuretic therapy (at home patient on torsemide and chlorthalidone).    Paroxysmal atrial fibrillation (HCC) Telemetry personally reviewed, patient with sinus bradycardia 40 to 50 bpm, with occasional PAC and PVC. Plant to hold on metoprolol for now and continue close telemetry monitoring.  Out of bed to chair tid with meals. Continue with Pt and Ot.  Anticoagulation with apixaban.   Diabetes mellitus type 2, insulin dependent (HCC) Uncontrolled with hyperglycemia.  Fasting glucose today is 176 mg/dl.  Continue glucose cover and monitoring with insulin sliding scale.  Patient is tolerating po well.   History of CVA (cerebrovascular accident) Chronic left fronto temporal encephalomalacia.  Continue blood pressure control Follow up with Pt and Ot Patient with multiple falls at home.  Headache improved with as needed oxycodone.   Continue blood pressure control and resume statin therapy.  Anticoagulation with apixaban,   CAD (coronary artery disease) No active chest pain or angina. Continue blood pressure control.   Essential hypertension Systolic blood  pressure 160 to 180 mmHg Continue isosorbide and will resume amlodipine Will continue to hold home medications including diuretic therapy (torsemide and chlorthalidone) and hydralazine.         Subjective: Patient with no chest pain or dyspnea, headache has  improved, tolerating po well, continue to be very weak and deconditioned, not back to his baseline   Physical Exam: Vitals:   09/29/22 2100 09/30/22 0059 09/30/22 0614 09/30/22 0743  BP:  (!) 181/72 (!) 188/69 (!) 155/72  Pulse: (!) 56 (!) 44 (!) 45 (!) 50  Resp: '16 15 15 15  '$ Temp:  97.7 F (36.5 C) 97.8 F (36.6 C) 97.9 F (36.6 C)  TempSrc:  Oral Oral Oral  SpO2: 97% 96% 97% 98%  Weight:   84.9 kg   Height:       Neurology awake and alert ENT with mild pallor Cardiovascular with S1 and S2 present and rhythmic with no gallops, rubs or murmurs Respiratory with no rales or wheezing Abdomen with no distention  No lower extremity edema  Data Reviewed:    Family Communication: no family at the bedside   Disposition: Status is: Inpatient Remains inpatient appropriate because: telemetry monitoring for bradycardia, pending transfer to SNF   Planned Discharge Destination: Skilled nursing facility    Author: Tawni Millers, MD 09/30/2022 9:58 AM  For on call review [email or web address].

## 2022-09-30 NOTE — Evaluation (Signed)
Occupational Therapy Evaluation Patient Details Name: Jake Gross MRN: [id number] DOB: [birth year] Today's Date: 09/30/2022   History of Present Illness Patient is a 81 y/o male who presents on 12/13 with SOB, multiple falls, generalized weakness and fatigue. Found to have AKI superimposed on CKD stage 4. 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   Clinical Impression   Pt reports at baseline that they live alone. Pt at this time could complete UE while sitting at EOB with post set up to min guard and LB ADLs with min guard with lateral leans while sitting. Pt was limited with standing activity at this time due to BP but was asymptomatic. Pt o2 remained stable in 90% and HR 50-60s. Pt currently with functional limitations due to the deficits listed below (see OT Problem List).  Pt will benefit from skilled OT to increase their safety and independence with ADL and functional mobility for ADL to facilitate discharge to venue listed below.    Supine: 170/64 Sitting at EOB: 117/62 Sitting at EOB for 3 mins: 120/65 Standing: 93/55 Sitting in chair: 96/57 Semi supine in chair 138/68    Recommendations for follow up therapy are one component of a multi-disciplinary discharge planning process, led by the attending physician.  Recommendations may be updated based on patient status, additional functional criteria and insurance authorization.   Follow Up Recommendations  Skilled nursing-short term rehab (<3 hours/day)     Assistance Recommended at Discharge Frequent or constant Supervision/Assistance  Patient can return home with the following A little help with walking and/or transfers;A little help with bathing/dressing/bathroom;Assistance with cooking/housework;Assist for transportation    Functional Status Assessment  Patient has had a recent decline in their functional status and demonstrates the ability to make significant improvements in function in a  reasonable and predictable amount of time.  Equipment Recommendations  Other (comment) (TBD at next level of care)    Recommendations for Other Services       Precautions / Restrictions Precautions Precautions: Fall;Other (comment) Precaution Comments: watch 02, BP Restrictions Weight Bearing Restrictions: No      Mobility Bed Mobility Overal bed mobility: Needs Assistance Bed Mobility: Supine to Sit     Supine to sit: Min assist, HOB elevated     General bed mobility comments: assist with rotation of trunk to EOB to increase in ability to sit at EOB    Transfers Overall transfer level: Needs assistance Equipment used: Rolling walker (2 wheels) Transfers: Sit to/from Stand Sit to Stand: Min guard, From elevated surface                  Balance Overall balance assessment: History of Falls, Needs assistance Sitting-balance support: Feet supported, Bilateral upper extremity supported, No upper extremity supported Sitting balance-Leahy Scale: Good     Standing balance support: During functional activity, Bilateral upper extremity supported Standing balance-Leahy Scale: Poor                             ADL either performed or assessed with clinical judgement   ADL Overall ADL's : Needs assistance/impaired Eating/Feeding: Independent;Sitting   Grooming: Wash/dry hands;Oral care;Wash/dry face;Independent;Sitting   Upper Body Bathing: Set up;Sitting Upper Body Bathing Details (indicate cue type and reason): set up due to BP Lower Body Bathing: Min guard;Sit to/from stand;Sitting/lateral leans   Upper Body Dressing : Set up;Sitting   Lower Body Dressing: Min guard;Sit to/from stand;Sitting/lateral leans;Cueing  for safety;Cueing for sequencing   Toilet Transfer: Min guard;Cueing for safety;Cueing for sequencing;Stand-pivot;BSC/3in1;Rolling walker (2 wheels)   Toileting- Clothing Manipulation and Hygiene: Minimal assistance;Min guard;Sit to/from  stand       Functional mobility during ADLs: Min guard;Rolling walker (2 wheels) General ADL Comments: limited to 2 ft due to BP     Vision Baseline Vision/History: 1 Wears glasses       Perception     Praxis      Pertinent Vitals/Pain Pain Assessment Pain Assessment: 0-10 Pain Score: 1  Pain Location: R elbow Pain Descriptors / Indicators: Sore Pain Intervention(s): Limited activity within patient's tolerance     Hand Dominance Right   Extremity/Trunk Assessment Upper Extremity Assessment Upper Extremity Assessment: RUE deficits/detail RUE Deficits / Details: due to falling onto elbow reported that very sore   Lower Extremity Assessment Lower Extremity Assessment: Defer to PT evaluation RLE Sensation: decreased light touch LLE Sensation: decreased light touch   Cervical / Trunk Assessment Cervical / Trunk Assessment: Kyphotic   Communication Communication Communication: No difficulties   Cognition Arousal/Alertness: Awake/alert Behavior During Therapy: WFL for tasks assessed/performed Overall Cognitive Status: Within Functional Limits for tasks assessed                                       General Comments       Exercises     Shoulder Instructions      Home Living Family/patient expects to be discharged to:: Private residence Living Arrangements: Alone Available Help at Discharge: Family;Available PRN/intermittently Type of Home: House Home Access: Stairs to enter [employer] of Steps: 5 Entrance Stairs-Rails: Right;Can reach both;Left Home Layout: One level     Bathroom Shower/Tub: Occupational psychologist: Standard     Home Equipment: Cane - single point   Additional Comments: Pt reports the cane they made themself      Prior Functioning/Environment Prior Level of Function : Independent/Modified Independent             Mobility Comments: independent, drives, cooks/cleans ADLs Comments:  independent        OT Problem List: Decreased strength;Decreased activity tolerance;Impaired balance (sitting and/or standing);Decreased safety awareness;Decreased knowledge of use of DME or AE;Cardiopulmonary status limiting activity;Pain      OT Treatment/Interventions: Self-care/ADL training;Therapeutic exercise;DME and/or AE instruction;Therapeutic activities;Patient/family education;Balance training    OT Goals(Current goals can be found in the care plan section) Acute Rehab OT Goals Patient Stated Goal: to be able to get stronger on my feet OT Goal Formulation: With patient Time For Goal Achievement: 10/14/22 Potential to Achieve Goals: Good  OT Frequency: Min 2X/week    Co-evaluation              AM-PAC OT "6 Clicks" Daily Activity     Outcome Measure Help from another person eating meals?: None Help from another person taking care of personal grooming?: A Little Help from another person toileting, which includes using toliet, bedpan, or urinal?: A Little Help from another person bathing (including washing, rinsing, drying)?: A Little Help from another person to put on and taking off regular upper body clothing?: A Little Help from another person to put on and taking off regular lower body clothing?: A Little 6 Click Score: 19   End of Session Equipment Utilized During Treatment: Gait belt;Rolling walker (2 wheels) Nurse Communication: Mobility status  Activity Tolerance: Patient tolerated treatment well;Other (  comment) (BP) Patient left: in chair;with call bell/phone within reach;with chair alarm set  OT Visit Diagnosis: Unsteadiness on feet (R26.81);Other abnormalities of gait and mobility (R26.89);Repeated falls (R29.6);Muscle weakness (generalized) (M62.81);History of falling (Z91.81);Pain Pain - Right/Left: Right Pain - part of body:  (elbow)                Time: 8588-5027 OT Time Calculation (min): 35 min Charges:  OT General Charges $OT Visit: 1 Visit OT  Evaluation $OT Eval Low Complexity: 1 Low OT Treatments $Self Care/Home Management : 8-22 mins  Joeseph Amor OTR/L  Acute Rehab Services  [phone] office number   Joeseph Amor 09/30/2022, 9:14 AM

## 2022-09-30 NOTE — Plan of Care (Signed)
  Problem: Education: Goal: Knowledge of General Education information will improve Description: Including pain rating scale, medication(s)/side effects and non-pharmacologic comfort measures Outcome: Progressing   Problem: Clinical Measurements: Goal: Cardiovascular complication will be avoided Outcome: Progressing   Problem: Clinical Measurements: Goal: Diagnostic test results will improve Outcome: Progressing   Problem: Activity: Goal: Risk for activity intolerance will decrease Outcome: Progressing   Problem: Nutrition: Goal: Adequate nutrition will be maintained Outcome: Progressing   Problem: Coping: Goal: Level of anxiety will decrease Outcome: Progressing   Problem: Safety: Goal: Ability to remain free from injury will improve Outcome: Progressing   Problem: Skin Integrity: Goal: Risk for impaired skin integrity will decrease Outcome: Progressing   Problem: Pain Managment: Goal: General experience of comfort will improve Outcome: Progressing

## 2022-10-01 DIAGNOSIS — I480 Paroxysmal atrial fibrillation: Secondary | ICD-10-CM | POA: Diagnosis not present

## 2022-10-01 DIAGNOSIS — E119 Type 2 diabetes mellitus without complications: Secondary | ICD-10-CM | POA: Diagnosis not present

## 2022-10-01 DIAGNOSIS — I5032 Chronic diastolic (congestive) heart failure: Secondary | ICD-10-CM | POA: Diagnosis not present

## 2022-10-01 DIAGNOSIS — N179 Acute kidney failure, unspecified: Secondary | ICD-10-CM | POA: Diagnosis not present

## 2022-10-01 LAB — GLUCOSE, CAPILLARY
Glucose-Capillary: 207 mg/dL — ABNORMAL HIGH (ref 70–99)
Glucose-Capillary: 201 mg/dL — ABNORMAL HIGH (ref 70–99)
Glucose-Capillary: 300 mg/dL — ABNORMAL HIGH (ref 70–99)
Glucose-Capillary: 225 mg/dL — ABNORMAL HIGH (ref 70–99)

## 2022-10-01 MED ORDER — INSULIN GLARGINE-YFGN 100 UNIT/ML ~~LOC~~ SOLN
10.0000 [IU] | Freq: Every day | SUBCUTANEOUS | Status: DC
  Administered 2022-10-01 – 2022-10-02 (×2): 10 [IU] via SUBCUTANEOUS
  Filled 2022-10-01 (×3): qty 0.1

## 2022-10-01 MED ORDER — HYDRALAZINE HCL 50 MG PO TABS
50.0000 mg | ORAL_TABLET | Freq: Two times a day (BID) | ORAL | Status: DC
  Administered 2022-10-01 – 2022-10-03 (×5): 50 mg via ORAL
  Filled 2022-10-01 (×5): qty 1

## 2022-10-01 NOTE — Plan of Care (Signed)
  Problem: Health Behavior/Discharge Planning: Goal: Ability to manage health-related needs will improve Outcome: Progressing   Problem: Clinical Measurements: Goal: Cardiovascular complication will be avoided Outcome: Progressing   Problem: Activity: Goal: Risk for activity intolerance will decrease Outcome: Progressing   Problem: Nutrition: Goal: Adequate nutrition will be maintained Outcome: Progressing   Problem: Coping: Goal: Level of anxiety will decrease Outcome: Progressing   Problem: Safety: Goal: Ability to remain free from injury will improve Outcome: Progressing   Problem: Skin Integrity: Goal: Risk for impaired skin integrity will decrease Outcome: Progressing

## 2022-10-01 NOTE — Progress Notes (Signed)
Mobility Specialist Progress Note    10/01/22 1541  Mobility  Activity Ambulated with assistance in hallway  Level of Assistance Minimal assist, patient does 75% or more  Assistive Device Front wheel walker  Distance Ambulated (ft) 120 ft  Activity Response Tolerated well  Mobility Referral Yes  $Mobility charge 1 Mobility   Pre-Mobility: 54 HR, 156/68 (95) BP, 97% SpO2 During Mobility: 99% SpO2 Post-Mobility: 62 HR, 168/96 (117) BP, 97% SpO2  Pt received in bed and agreeable. No complaints on walk. Returned to bed with call bell in reach.    Hildred Alamin Mobility Specialist  Please Psychologist, sport and exercise or Rehab Office at [phone]

## 2022-10-01 NOTE — Progress Notes (Signed)
Progress Note   Patient: Jake Gross MPN:[id number] DOB: [birth year] DOA: 09/27/2022     3 DOS: the patient was seen and examined on 10/01/2022   Brief hospital course: Jake Gross was admitted to the hospital with the working diagnosis of acute on chronic renal failure.   81 yo male with the past medical history of coronary artery disease, heart failure, T2DM, paroxysmal atrial fibrillation, MGUS, and CKD stage 4 who presented with generalized weakness, lightheadedness and frequent falls. Worsening symptoms for the last 4 days, associated with dyspnea with exertion. On his initial physical examination his blood pressure was 134/76, HR 52, RR 18 and 02 saturation 97%, lungs with no wheezing or rales, heart with S1 and S2 present and rhythmic, abdomen with no distention, no lower extremity edema.   Na 137, K 4.3 CL 96 bicarbonate 20 glucose 286 bun 100 cr 3,9  Lactic acid 4,0 Wbc 9,8 hgb 15.2 plt 265   Urine analysis SG 1,019, 100 protein, pH 5.0 negative leukocytes, 0-5 rbc   Chest radiograph with no infiltrates, mild cardiomegaly.  Head CT with mild frontal scalp edema. No acute intracranial changes.  Left frontotemporal encephalomalacia due to remote ischemia.  Cervical CT with no acute changes.  CT chest abdomen and pelvis with no acute changes.   EKG 56 bpm, normal axis, normal intervals, manually corrected qtc 0.480, sinus rhythm with poor R R wave progression and LVH, no significant ST segment or T wave changes.   12/15 renal function is improving, continue to be very weak and deconditioned.  12/16 patient has been bradycardic, metoprolol held. Patient will need SNF at the time of his discharge.  12/17 patient medically stable for discharge to SNF, possible transfer tomorrow, pending insurance authorization.   Assessment and Plan: * Acute renal failure 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 (HCC) Hypokalemia   His renal function has been improving, likely back to  baseline, IV fluids have been discontinued and patient is tolerating po well.  Follow up renal function in am.   Avoid hypotension and nephrotoxic medications.   Chronic diastolic CHF (congestive heart failure) (HCC) No clinical signs of acute heart failure decompensation.  Echocardiogram 2022 with preserved LV systolic function with EF 60 to 65%, mild LVH, RV systolic function preserved. LA with mild to moderate dilatation.   Urine output is  1,450 ml over last 24 hrs Systolic blood pressure 008 to 140 mmHg.   Continue isosorbide and amlodipine for blood pressure control. Resume hydralazine bid.  Continue to hold on diuretic therapy (at home patient on torsemide and chlorthalidone).  No ACE or ARB due to very low GFR.   Paroxysmal atrial fibrillation (HCC) Telemetry personally reviewed, patient with sinus bradycardia 50 to 60 bpm, with occasional PAC and PVC. Continue to hold on metoprolol and continue telemetry monitoring.   Out of bed to chair tid with meals. Continue with Pt and Ot.  Anticoagulation with apixaban.   Diabetes mellitus type 2, insulin dependent (HCC) Uncontrolled with hyperglycemia. Fasting glucose 207 and preprandial 201   Resume basal insulin with 10 units. (At home patient on 60 units).  Continue glucose cover and monitoring with insulin sliding scale.  History of CVA (cerebrovascular accident) Chronic left fronto temporal encephalomalacia.  Continue blood pressure control Follow up with Pt and Ot Patient with multiple falls at home.  Headache improved with as needed oxycodone.   Continue blood pressure control and resume statin therapy.  Anticoagulation with apixaban,   CAD (coronary artery disease)  No active chest pain or angina. Continue blood pressure control.   Essential hypertension Systolic blood pressure 65mHg Continue isosorbide and amlodipine Resume hydralazine at a lower dose of 50 mg po bid.  Will continue to hold home medications  including diuretic therapy (torsemide and chlorthalidone).         Subjective: Patient is feeling better but continue very weak and deconditioned, not back to his baseline. Tolerating po well.   Physical Exam: Vitals:   10/01/22 0333 10/01/22 0501 10/01/22 0736 10/01/22 1120  BP: (!) 163/65  (!) 145/88 (!) 141/61  Pulse: 61 (!) 54 62 60  Resp: '16  16 18  '$ Temp: 98.8 F (37.1 C)  (!) 97.4 F (36.3 C) (!) 97.4 F (36.3 C)  TempSrc: Oral  Oral Oral  SpO2: 95% 96% 97% 95%  Weight:  84.7 kg    Height:       Neurology awake and alert ENT with mild pallor Cardiovascular with S1 and S2 present and rhythmic with no gallops, rubs or murmurs Respiratory with no rales or wheezing, no rhonchi Abdomen with no distention. No lower extremity edema  Data Reviewed:    Family Communication: no family at the bedside   Disposition: Status is: Inpatient Remains inpatient appropriate because: pending transfer to SNF possible tomorrow pending insurance authorization   Planned Discharge Destination: Skilled nursing facility    Author: MTawni Millers MD 10/01/2022 12:05 PM  For on call review [email or web address]

## 2022-10-02 LAB — RENAL FUNCTION PANEL
Albumin: 2.7 g/dL — ABNORMAL LOW (ref 3.5–5.0)
Anion gap: 10 (ref 5–15)
BUN: 62 mg/dL — ABNORMAL HIGH (ref 8–23)
CO2: 22 mmol/L (ref 22–32)
Calcium: 8.4 mg/dL — ABNORMAL LOW (ref 8.9–10.3)
Chloride: 105 mmol/L (ref 98–111)
Creatinine, Ser: 2.26 mg/dL — ABNORMAL HIGH (ref 0.61–1.24)
GFR, Estimated: 29 mL/min — ABNORMAL LOW (ref >60)
Glucose, Bld: 237 mg/dL — ABNORMAL HIGH (ref 70–99)
Phosphorus: 2.8 mg/dL (ref 2.5–4.6)
Potassium: 3.9 mmol/L (ref 3.5–5.1)
Sodium: 137 mmol/L (ref 135–145)

## 2022-10-02 LAB — GLUCOSE, CAPILLARY
Glucose-Capillary: 202 mg/dL — ABNORMAL HIGH (ref 70–99)
Glucose-Capillary: 254 mg/dL — ABNORMAL HIGH (ref 70–99)
Glucose-Capillary: 256 mg/dL — ABNORMAL HIGH (ref 70–99)
Glucose-Capillary: 341 mg/dL — ABNORMAL HIGH (ref 70–99)
Glucose-Capillary: 311 mg/dL — ABNORMAL HIGH (ref 70–99)

## 2022-10-02 NOTE — Progress Notes (Signed)
Consultation Progress Note   Patient: Jake Gross WEX:[id number] DOB: August 27, 1941 DOA: 09/27/2022 DOS: the patient was seen and examined on 10/02/2022 Primary service: Helmi Hechavarria, Manfred Shirts, MD  Brief hospital course: Jake Gross was admitted to the hospital with the working diagnosis of acute on chronic renal failure.   81 yo male with the past medical history of coronary artery disease, heart failure, T2DM, paroxysmal atrial fibrillation, MGUS, and CKD stage 4 who presented with generalized weakness, lightheadedness and frequent falls. Worsening symptoms for the last 4 days, associated with dyspnea with exertion. On his initial physical examination his blood pressure was 134/76, HR 52, RR 18 and 02 saturation 97%, lungs with no wheezing or rales, heart with S1 and S2 present and rhythmic, abdomen with no distention, no lower extremity edema.   Na 137, K 4.3 CL 96 bicarbonate 20 glucose 286 bun 100 cr 3,9  Lactic acid 4,0 Wbc 9,8 hgb 15.2 plt 265   Urine analysis SG 1,019, 100 protein, pH 5.0 negative leukocytes, 0-5 rbc   Chest radiograph with no infiltrates, mild cardiomegaly.  Head CT with mild frontal scalp edema. No acute intracranial changes.  Left frontotemporal encephalomalacia due to remote ischemia.  Cervical CT with no acute changes.  CT chest abdomen and pelvis with no acute changes.   EKG 56 bpm, normal axis, normal intervals, manually corrected qtc 0.480, sinus rhythm with poor R R wave progression and LVH, no significant ST segment or T wave changes.   12/15 renal function is improving, continue to be very weak and deconditioned.  12/16 patient has been bradycardic, metoprolol held. Patient will need SNF at the time of his discharge.  12/17 patient medically stable for discharge to SNF, possible transfer tomorrow, pending insurance authorization.   Assessment and Plan: * Acute renal failure 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 (HCC) Hypokalemia   His renal  function has been improving, likely back to baseline, IV fluids have been discontinued and patient is tolerating po well.  Follow up renal function in am.   Avoid hypotension and nephrotoxic medications.   Chronic diastolic CHF (congestive heart failure) (HCC) No clinical signs of acute heart failure decompensation.  Echocardiogram 2022 with preserved LV systolic function with EF 60 to 65%, mild LVH, RV systolic function preserved. LA with mild to moderate dilatation.   Urine output is  1,450 ml over last 24 hrs Systolic blood pressure 938 to 140 mmHg.   Continue isosorbide and amlodipine for blood pressure control. Resume hydralazine bid.  Continue to hold on diuretic therapy (at home patient on torsemide and chlorthalidone).  No ACE or ARB due to very low GFR.   Paroxysmal atrial fibrillation (HCC) Telemetry personally reviewed, patient with sinus bradycardia 50 to 60 bpm, with occasional PAC and PVC. Continue to hold on metoprolol and continue telemetry monitoring.   Out of bed to chair tid with meals. Continue with Pt and Ot.  Anticoagulation with apixaban.   Diabetes mellitus type 2, insulin dependent (HCC) Uncontrolled with hyperglycemia. Fasting glucose 207 and preprandial 201   Resume basal insulin with 10 units. (At home patient on 60 units).  Continue glucose cover and monitoring with insulin sliding scale.  History of CVA (cerebrovascular accident) Chronic left fronto temporal encephalomalacia.  Continue blood pressure control Follow up with Pt and Ot Patient with multiple falls at home.  Headache improved with as needed oxycodone.   Continue blood pressure control and resume statin therapy.  Anticoagulation with apixaban,   CAD (coronary artery  disease) No active chest pain or angina. Continue blood pressure control.   Essential hypertension Systolic blood pressure 31mHg Continue isosorbide and amlodipine Resume hydralazine at a lower dose of 50 mg po bid.   Will continue to hold home medications including diuretic therapy (torsemide and chlorthalidone).         TR[facility] will continue to follow the patient.  Subjective: Seen at bedside, patient reports feeling tired  and weak  Physical Exam: Vitals:   10/02/22 0400 10/02/22 0723 10/02/22 0904 10/02/22 1103  BP: (!) 157/60 (!) 163/68 118/86 (!) 141/75  Pulse: (!) 54 (!) 59  65  Resp: '16 16  16  '$ Temp: 98.3 F (36.8 C) 97.8 F (36.6 C)  (!) 97.3 F (36.3 C)  TempSrc: Oral Oral  Oral  SpO2: 98% 98%  97%  Weight: 85.2 kg     Height:       Neurology awake and alert ENT with mild pallor Cardiovascular with S1 and S2 present and rhythmic with no gallops, rubs or murmurs Respiratory with no rales or wheezing, no rhonchi Abdomen with no distention. No lower extremity edema  Data Reviewed:  There are no new results to review at this time.  Family Communication: None   Time spent: 15 minutes.  Author: PCristela Felt MD 10/02/2022 11:29 AM  For on call review [email or web address]

## 2022-10-02 NOTE — Progress Notes (Signed)
Mobility Specialist - Progress Note   10/02/22 1355  Mobility  Activity Ambulated independently in hallway  Level of Assistance Minimal assist, patient does 75% or more  Assistive Device Front wheel walker  Distance Ambulated (ft) 350 ft  Activity Response Tolerated well  Mobility Referral Yes  $Mobility charge 1 Mobility    Pre-mobility: 122/71(83) BP, 96% SpO2 During mobility: 114 HR, 97% SpO2 Post-mobility: 65 HR,156/63(89) BP, 95% SpO2  Pt received in chair and agreeable to session. No complaints throughout. Pt was returned to chair with all needs met and chair alarm on.  Franki Monte  Mobility Specialist Please contact via Solicitor or Rehab office at [phone]

## 2022-10-02 NOTE — Progress Notes (Signed)
Physical Therapy Treatment Patient Details Name: Jake Gross MRN: [id number] DOB: December 27, 1940 Today's Date: 10/02/2022   History of Present Illness Patient is a 81 y/o male who presents on 12/13 with SOB, multiple falls, generalized weakness and fatigue. Found to have AKI superimposed on CKD stage 4. 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    PT Comments    Pt supine in bed on entry, agreeable to getting up to recliner. Pt Pureflow malfunctioned and bed found to be saturated in urine, pt unaware. Pt limited in safe mobility by poor safety awareness, both with mobility and also with home situation. Provided education on need for SNF to become strong enough to be independent at home. Also educated on need for better plan, (phone, life alert) for safety at home alone. Pt is in agreement. Pt also limited in safe mobility due to generalized weakness and decreased knowledge of DME use. PT continues to recommend SNF level rehab prior to going home.   Recommendations for follow up therapy are one component of a multi-disciplinary discharge planning process, led by the attending physician.  Recommendations may be updated based on patient status, additional functional criteria and insurance authorization.  Follow Up Recommendations  Skilled nursing-short term rehab (<3 hours/day) Can patient physically be transported by private vehicle: No   Assistance Recommended at Discharge Frequent or constant Supervision/Assistance  Patient can return home with the following A lot of help with walking and/or transfers;A little help with bathing/dressing/bathroom;Assistance with cooking/housework;Assist for transportation;Help with stairs or ramp for entrance   Equipment Recommendations  Rolling walker (2 wheels)       Precautions / Restrictions Precautions Precautions: Fall;Other (comment) Precaution Comments: watch 02 Restrictions Weight Bearing Restrictions: No      Mobility  Bed Mobility Overal bed mobility: Needs Assistance Bed Mobility: Supine to Sit, Sit to Supine     Supine to sit: HOB elevated, Mod assist     General bed mobility comments: pt awith increased time and effort to come to EoB, eventually need modA for pad scoot to EOB with heavy use of bedrail    Transfers Overall transfer level: Needs assistance Equipment used: Rolling walker (2 wheels) Transfers: Sit to/from Stand Sit to Stand: Mod assist, From elevated surface           General transfer comment: modA for power up and steadying, heavy bracing of LE against EoB and requires assist to bring CoG over BoS    Ambulation/Gait Ambulation/Gait assistance: Min assist, Mod assist Gait Distance (Feet): 25 Feet (+50, +50 +25) Assistive device: Rolling walker (2 wheels) Gait Pattern/deviations: Step-through pattern, Decreased step length - right, Decreased step length - left, Trunk flexed, Decreased stride length Gait velocity: decreased Gait velocity interpretation: <1.31 ft/sec, indicative of household ambulator   General Gait Details: generally min A for ambulation however requires rest breaks, as he fatigues easily, and becomes more flexed. constant cuing for proximity to RW, experiences 1x LOB requiring mod A for steadying         Balance Overall balance assessment: History of Falls, Needs assistance Sitting-balance support: Feet supported, No upper extremity supported Sitting balance-Leahy Scale: Good Sitting balance - Comments: Able to reach outside BoS without difficulty.   Standing balance support: During functional activity, Bilateral upper extremity supported, Reliant on assistive device for balance Standing balance-Leahy Scale: Poor Standing balance comment: Relies on UE support and external support, bil knee instability/weakness with distance  Cognition Arousal/Alertness: Awake/alert Behavior During Therapy: WFL for  tasks assessed/performed Overall Cognitive Status: Within Functional Limits for tasks assessed                                 General Comments: has slightly decreased awareness of deficits until reminded by how difficult it is for him to safely mobilize           General Comments General comments (skin integrity, edema, etc.): BP 146/80 with standing EoB, drops to 116/68 with ambulation, pt with no complaints of dizziness      Pertinent Vitals/Pain Pain Assessment Pain Assessment: Faces Faces Pain Scale: Hurts even more Pain Location: chaffed back Pain Descriptors / Indicators: Grimacing, Guarding, Sore, Burning Pain Intervention(s): Limited activity within patient's tolerance, Monitored during session, Repositioned     PT Goals (current goals can now be found in the care plan section) Acute Rehab PT Goals Patient Stated Goal: to get stronger PT Goal Formulation: With patient Time For Goal Achievement: 10/13/22 Potential to Achieve Goals: Good Progress towards PT goals: Progressing toward goals    Frequency    Min 3X/week      PT Plan Current plan remains appropriate       AM-PAC PT "6 Clicks" Mobility   Outcome Measure  Help needed turning from your back to your side while in a flat bed without using bedrails?: A Little Help needed moving from lying on your back to sitting on the side of a flat bed without using bedrails?: A Little Help needed moving to and from a bed to a chair (including a wheelchair)?: A Little Help needed standing up from a chair using your arms (e.g., wheelchair or bedside chair)?: A Lot Help needed to walk in hospital room?: Total Help needed climbing 3-5 steps with a railing? : Total 6 Click Score: 13    End of Session Equipment Utilized During Treatment: Gait belt Activity Tolerance: Patient limited by fatigue Patient left: with call bell/phone within reach;in chair;with chair alarm set Nurse Communication: Mobility  status;Other (comment) (BP) PT Visit Diagnosis: Pain;Muscle weakness (generalized) (M62.81);Difficulty in walking, not elsewhere classified (R26.2);Unsteadiness on feet (R26.81) Pain - part of body:  (head)     Time: 3570-1779 PT Time Calculation (min) (ACUTE ONLY): 44 min  Charges:  $Gait Training: 8-22 mins $Therapeutic Activity: 8-22 mins $Self Care/Home Management: 8-22                     Selim Durden B. Migdalia Dk PT, DPT Acute Rehabilitation Services Please use secure chat or  Call Office [phone]    [place] 10/02/2022, 11:12 AM

## 2022-10-02 NOTE — TOC Progression Note (Signed)
Transition of Care [facility]) - Progression Note    Patient Details  Name: Jake Gross MRN: [id number] Date of Birth: [birth year]  Transition of [facility] CM/SW [place], [place] Phone Number: 10/02/2022, 4:53 PM  Clinical Narrative:     Patient has SN[facility] bed at [facility] when medically ready for discharge. CSW will continue to follow and assist with patients dc planning needs.   Expected Discharge Plan: [place] Barriers to Discharge: Continued Medical Work up  Expected Discharge Plan and Services Expected Discharge Plan: [place] In-house Referral: Clinical Social Work     Living arrangements for the past 2 months: Single Family Home                                       Social Determinants of Health (SDOH) Interventions    Readmission Risk Interventions     No data to display

## 2022-10-02 NOTE — Care Management Important Message (Signed)
Important Message  Patient Details  Name: Jake Gross MRN: [id number] Date of Birth: 1941-01-13   Medicare Important Message Given:  Yes     Shelda Altes 10/02/2022, 12:43 PM

## 2022-10-02 NOTE — Inpatient Diabetes Management (Signed)
Inpatient Diabetes Program Recommendations  AACE/ADA: New Consensus Statement on Inpatient Glycemic Control (2015)  Target Ranges:  Prepandial:   less than 140 mg/dL      Peak postprandial:   less than 180 mg/dL (1-2 hours)      Critically ill patients:  140 - 180 mg/dL   Lab Results  Component Value Date   GLUCAP 254 (H) 10/02/2022   HGBA1C 6.9 (H) 04/27/2022    Review of Glycemic Control  Latest Reference Range & Units 10/01/22 07:35 10/01/22 11:17 10/01/22 16:24 10/01/22 20:05 10/02/22 07:30 10/02/22 11:08  Glucose-Capillary 70 - 99 mg/dL 207 (H) 201 (H) 300 (H) 225 (H) 202 (H) 254 (H)   Diabetes history: DM 2 Outpatient Diabetes medications: Semglee 60 units Daily Current orders for Inpatient glycemic control:  Semglee 10 units  Novolog 0-6 units tid + hs  Note: Glucose trends increase after meal intake  Inpatient Diabetes Program Recommendations:    -   Consider adding Novolog 2 units tid meal coverage if eating >50% of meals  Thanks,  Tama Headings RN, MSN, BC-ADM Inpatient Diabetes Coordinator Team Pager [phone] (8a-5p)

## 2022-10-03 DIAGNOSIS — R051 Acute cough: Secondary | ICD-10-CM | POA: Diagnosis not present

## 2022-10-03 DIAGNOSIS — R55 Syncope and collapse: Secondary | ICD-10-CM | POA: Diagnosis not present

## 2022-10-03 DIAGNOSIS — S81001A Unspecified open wound, right knee, initial encounter: Secondary | ICD-10-CM | POA: Diagnosis not present

## 2022-10-03 DIAGNOSIS — L2089 Other atopic dermatitis: Secondary | ICD-10-CM | POA: Diagnosis not present

## 2022-10-03 DIAGNOSIS — R0600 Dyspnea, unspecified: Secondary | ICD-10-CM | POA: Diagnosis not present

## 2022-10-03 DIAGNOSIS — R001 Bradycardia, unspecified: Secondary | ICD-10-CM | POA: Diagnosis not present

## 2022-10-03 DIAGNOSIS — S81002A Unspecified open wound, left knee, initial encounter: Secondary | ICD-10-CM | POA: Diagnosis not present

## 2022-10-03 DIAGNOSIS — R2689 Other abnormalities of gait and mobility: Secondary | ICD-10-CM | POA: Diagnosis not present

## 2022-10-03 DIAGNOSIS — M25511 Pain in right shoulder: Secondary | ICD-10-CM | POA: Diagnosis not present

## 2022-10-03 DIAGNOSIS — R0989 Other specified symptoms and signs involving the circulatory and respiratory systems: Secondary | ICD-10-CM | POA: Diagnosis not present

## 2022-10-03 DIAGNOSIS — I480 Paroxysmal atrial fibrillation: Secondary | ICD-10-CM | POA: Diagnosis not present

## 2022-10-03 DIAGNOSIS — E1122 Type 2 diabetes mellitus with diabetic chronic kidney disease: Secondary | ICD-10-CM | POA: Diagnosis not present

## 2022-10-03 DIAGNOSIS — R5383 Other fatigue: Secondary | ICD-10-CM | POA: Diagnosis not present

## 2022-10-03 DIAGNOSIS — I5022 Chronic systolic (congestive) heart failure: Secondary | ICD-10-CM | POA: Diagnosis not present

## 2022-10-03 DIAGNOSIS — R296 Repeated falls: Secondary | ICD-10-CM | POA: Diagnosis not present

## 2022-10-03 DIAGNOSIS — J09X2 Influenza due to identified novel influenza A virus with other respiratory manifestations: Secondary | ICD-10-CM | POA: Diagnosis not present

## 2022-10-03 DIAGNOSIS — I5032 Chronic diastolic (congestive) heart failure: Secondary | ICD-10-CM | POA: Diagnosis not present

## 2022-10-03 DIAGNOSIS — R062 Wheezing: Secondary | ICD-10-CM | POA: Diagnosis not present

## 2022-10-03 DIAGNOSIS — R5381 Other malaise: Secondary | ICD-10-CM | POA: Diagnosis not present

## 2022-10-03 DIAGNOSIS — N184 Chronic kidney disease, stage 4 (severe): Secondary | ICD-10-CM | POA: Diagnosis not present

## 2022-10-03 DIAGNOSIS — M6281 Muscle weakness (generalized): Secondary | ICD-10-CM | POA: Diagnosis not present

## 2022-10-03 DIAGNOSIS — N401 Enlarged prostate with lower urinary tract symptoms: Secondary | ICD-10-CM | POA: Diagnosis not present

## 2022-10-03 DIAGNOSIS — E785 Hyperlipidemia, unspecified: Secondary | ICD-10-CM | POA: Diagnosis not present

## 2022-10-03 DIAGNOSIS — Z23 Encounter for immunization: Secondary | ICD-10-CM | POA: Diagnosis not present

## 2022-10-03 DIAGNOSIS — R059 Cough, unspecified: Secondary | ICD-10-CM | POA: Diagnosis not present

## 2022-10-03 DIAGNOSIS — R600 Localized edema: Secondary | ICD-10-CM | POA: Diagnosis not present

## 2022-10-03 DIAGNOSIS — R2681 Unsteadiness on feet: Secondary | ICD-10-CM | POA: Diagnosis not present

## 2022-10-03 DIAGNOSIS — S51001A Unspecified open wound of right elbow, initial encounter: Secondary | ICD-10-CM | POA: Diagnosis not present

## 2022-10-03 DIAGNOSIS — N179 Acute kidney failure, unspecified: Secondary | ICD-10-CM | POA: Diagnosis not present

## 2022-10-03 DIAGNOSIS — Z7401 Bed confinement status: Secondary | ICD-10-CM | POA: Diagnosis not present

## 2022-10-03 DIAGNOSIS — R278 Other lack of coordination: Secondary | ICD-10-CM | POA: Diagnosis not present

## 2022-10-03 DIAGNOSIS — R509 Fever, unspecified: Secondary | ICD-10-CM | POA: Diagnosis not present

## 2022-10-03 DIAGNOSIS — R41841 Cognitive communication deficit: Secondary | ICD-10-CM | POA: Diagnosis not present

## 2022-10-03 DIAGNOSIS — I130 Hypertensive heart and chronic kidney disease with heart failure and stage 1 through stage 4 chronic kidney disease, or unspecified chronic kidney disease: Secondary | ICD-10-CM | POA: Diagnosis not present

## 2022-10-03 DIAGNOSIS — W19XXXA Unspecified fall, initial encounter: Secondary | ICD-10-CM | POA: Diagnosis not present

## 2022-10-03 LAB — GLUCOSE, CAPILLARY
Glucose-Capillary: 241 mg/dL — ABNORMAL HIGH (ref 70–99)
Glucose-Capillary: 232 mg/dL — ABNORMAL HIGH (ref 70–99)

## 2022-10-03 NOTE — Progress Notes (Signed)
Mobility Specialist - Progress Note   10/03/22 1022  Mobility  Activity Ambulated with assistance in hallway  Level of Assistance Minimal assist, patient does 75% or more  Assistive Device Front wheel walker  Distance Ambulated (ft) 350 ft  Activity Response Tolerated well  Mobility Referral Yes  $Mobility charge 1 Mobility   Pt received in chair and agreeable to session. Pt c/o feeling weak and anxious about going to rehab facility today. Pt was returned to chair with all needs met.  Franki Monte  Mobility Specialist Please contact via Solicitor or Rehab office at [phone]

## 2022-10-03 NOTE — Discharge Summary (Signed)
Physician Discharge Summary   Patient: Jake Gross MRN: [id number] DOB: 11-Aug-1941  Admit date:     09/27/2022  Discharge date: 10/03/22  Discharge Physician: Manfred Shirts Jaemarie Hochberg   PCP: Michela Pitcher, NP   Recommendations at discharge:    Follow up with PCP post discharge  Discharge Diagnoses: Principal Problem:   Acute renal failure 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 ([place]) Active Problems:   Chronic diastolic CHF (congestive heart failure) (HCC)   Paroxysmal atrial fibrillation (HCC)   Diabetes mellitus type 2, insulin dependent (HCC)   History of CVA (cerebrovascular accident)   CAD (coronary artery disease)   Essential hypertension  Resolved Problems:   * No resolved hospital problems. [facility] Course: Jake Gross was admitted to the hospital with the working diagnosis of acute on chronic renal failure.   81 yo male with the past medical history of coronary artery disease, heart failure, T2DM, paroxysmal atrial fibrillation, MGUS, and CKD stage 4 who presented with generalized weakness, lightheadedness and frequent falls. Worsening symptoms for the last 4 days, associated with dyspnea with exertion. On his initial physical examination his blood pressure was 134/76, HR 52, RR 18 and 02 saturation 97%, lungs with no wheezing or rales, heart with S1 and S2 present and rhythmic, abdomen with no distention, no lower extremity edema.   Na 137, K 4.3 CL 96 bicarbonate 20 glucose 286 bun 100 cr 3,9  Lactic acid 4,0 Wbc 9,8 hgb 15.2 plt 265   Urine analysis SG 1,019, 100 protein, pH 5.0 negative leukocytes, 0-5 rbc   Chest radiograph with no infiltrates, mild cardiomegaly.  Head CT with mild frontal scalp edema. No acute intracranial changes.  Left frontotemporal encephalomalacia due to remote ischemia.  Cervical CT with no acute changes.  CT chest abdomen and pelvis with no acute changes.   EKG 56 bpm, normal axis, normal intervals, manually corrected qtc  0.480, sinus rhythm with poor R R wave progression and LVH, no significant ST segment or T wave changes.   12/15 renal function is improving, continue to be very weak and deconditioned.  12/16 patient has been bradycardic, metoprolol held. Patient will need SNF at the time of his discharge.  12/19 patient medically stable for discharge to SNF, possible transfer tomorrow, pending insurance authorization.   Assessment and Plan: * Acute renal failure 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 (HCC) Hypokalemia   His renal function has been improving, likely back to baseline, IV fluids have been discontinued and patient is tolerating po well.  Follow up renal function in am.   Avoid hypotension and nephrotoxic medications.   Chronic diastolic CHF (congestive heart failure) (HCC) No clinical signs of acute heart failure decompensation.  Echocardiogram 2022 with preserved LV systolic function with EF 60 to 65%, mild LVH, RV systolic function preserved. LA with mild to moderate dilatation.   Urine output is  1,450 ml over last 24 hrs Systolic blood pressure 824 to 140 mmHg.   Continue isosorbide and amlodipine for blood pressure control. Resume hydralazine bid.  Continue to hold on diuretic therapy (at home patient on torsemide and chlorthalidone).  No ACE or ARB due to very low GFR.   Paroxysmal atrial fibrillation (HCC) Telemetry personally reviewed, patient with sinus bradycardia 50 to 60 bpm, with occasional PAC and PVC. Continue to hold on metoprolol and continue telemetry monitoring.   Out of bed to chair tid with meals. Continue with Pt and Ot.  Anticoagulation with apixaban.  Diabetes mellitus type 2, insulin dependent (HCC) Uncontrolled with hyperglycemia. Fasting glucose 207 and preprandial 201   Resume basal insulin with 10 units. (At home patient on 60 units).  Continue glucose cover and monitoring with insulin sliding scale.  History of CVA (cerebrovascular  accident) Chronic left fronto temporal encephalomalacia.  Continue blood pressure control Follow up with Pt and Ot Patient with multiple falls at home.  Headache improved with as needed oxycodone.   Continue blood pressure control and resume statin therapy.  Anticoagulation with apixaban,   CAD (coronary artery disease) No active chest pain or angina. Continue blood pressure control.   Essential hypertension Systolic blood pressure 90mHg Continue isosorbide and amlodipine Resume hydralazine at a lower dose of 50 mg po bid.  Will continue to hold home medications including diuretic therapy (torsemide and chlorthalidone).          Consultants: None Procedures performed:   Disposition: Skilled nursing facility Diet recommendation:  Renal diet DISCHARGE MEDICATION: Allergies as of 10/03/2022   No Known Allergies      Medication List     STOP taking these medications    torsemide 20 MG tablet Commonly known as: DEMADEX       TAKE these medications    amLODipine 5 MG tablet Commonly known as: NORVASC Take 1 tablet (5 mg total) by mouth daily. Please call to schedule an overdue appointment with Dr. MAngelena Formfor refills, [phone] thank you. 2ND ATTEMPT   BD Pen Needle Nano 2nd Gen 32G X 4 MM Misc Generic drug: Insulin Pen Needle Use with insulin pen as directed   chlorthalidone 25 MG tablet Commonly known as: HYGROTON Take 25 mg by mouth daily.   Eliquis 5 MG Tabs tablet Generic drug: apixaban TAKE 1 TABLET BY MOUTH TWICE A DAY What changed: how much to take   hydrALAZINE 100 MG tablet Commonly known as: APRESOLINE Take 1 tablet (100 mg total) by mouth 3 (three) times daily. Please call to schedule an overdue appointment with Dr. MAngelena Formfor refills, [phone] thank you. 1st attempt.   insulin glargine-yfgn 100 UNIT/ML Pen Commonly known as: Semglee (yfgn) Inject 60 Units into the skin every morning.   isosorbide mononitrate 60 MG 24 hr  tablet Commonly known as: IMDUR Take 1 tablet (60 mg total) by mouth daily. Please call to schedule an overdue appointment with Dr. MAngelena Formfor refills, [phone] thank you. 1st attempt.   metoprolol tartrate 25 MG tablet Commonly known as: LOPRESSOR Take 1.5 tablets (37.5 mg total) by mouth 2 (two) times daily. Please call to schedule an overdue appointment with Dr. MAngelena Formfor refills, [phone] thank you. 1st attempt.   onetouch ultrasoft lancets Use as instructed   rosuvastatin 20 MG tablet Commonly known as: CRESTOR TAKE 1 TABLET BY MOUTH AT BEDTIME. SCHEDULE PHYSICAL EXAM   tamsulosin 0.4 MG Caps capsule Commonly known as: FLOMAX TAKE 1 CAPSULE BY MOUTH EVERY DAY        Discharge Exam: Filed Weights   10/01/22 0501 10/02/22 0400 10/03/22 0349  Weight: 84.7 kg 85.2 kg 85.3 kg   Neurology awake and alert ENT with mild pallor Cardiovascular with S1 and S2 present and rhythmic with no gallops, rubs or murmurs Respiratory with no rales or wheezing, no rhonchi Abdomen with no distention. No lower extremity edema   Condition at discharge: good  The results of significant diagnostics from this hospitalization (including imaging, microbiology, ancillary and laboratory) are listed below for reference.   Imaging Studies: CT CERVICAL SPINE WO CONTRAST  Result Date: 09/27/2022 CLINICAL DATA:  Polytrauma, blunt Multiple falls. EXAM: CT CERVICAL SPINE WITHOUT CONTRAST TECHNIQUE: Multidetector CT imaging of the cervical spine was performed without intravenous contrast. Multiplanar CT image reconstructions were also generated. RADIATION DOSE REDUCTION: This exam was performed according to the departmental dose-optimization program which includes automated exposure control, adjustment of the mA and/or kV according to patient size and/or use of iterative reconstruction technique. COMPARISON:  None Available. FINDINGS: Alignment: Normal. Skull base and vertebrae: No acute  fracture. Vertebral body heights are maintained. The dens and skull base are intact. Soft tissues and spinal canal: No prevertebral fluid or swelling. No visible canal hematoma. Disc levels: Diffuse degenerative disc disease from C3-C4 through C6-C7. C3-C4 on colo CIS may be degenerative or congenital. Upper chest: Assessed on concurrent chest CT, reported separately. Other: None. IMPRESSION: No acute fracture or subluxation. Multilevel degenerative disc disease. Electronically Signed   By: Keith Rake M.D.   On: 09/27/2022 23:08   CT HEAD WO CONTRAST  Result Date: 09/27/2022 CLINICAL DATA:  Head trauma, moderate-severe Multiple ground level falls, on anticoagulation. EXAM: CT HEAD WITHOUT CONTRAST TECHNIQUE: Contiguous axial images were obtained from the base of the skull through the vertex without intravenous contrast. RADIATION DOSE REDUCTION: This exam was performed according to the departmental dose-optimization program which includes automated exposure control, adjustment of the mA and/or kV according to patient size and/or use of iterative reconstruction technique. COMPARISON:  Head CT and brain MRI 06/30/2017 FINDINGS: Brain: No acute intracranial hemorrhage. No subdural or extra-axial collection. Moderate generalized atrophy. Left temporoparietal encephalomalacia, remote but new from 2018. No hydrocephalus. No midline shift or mass effect. Vascular: Atherosclerosis of skullbase vasculature without hyperdense vessel or abnormal calcification. Skull: No fracture or focal lesion. Sinuses/Orbits: No acute findings. Other: Mild left frontal scalp edema. IMPRESSION: 1. Mild left frontal scalp edema. No acute intracranial abnormality. No skull fracture. 2. Moderate atrophy. Left temporoparietal encephalomalacia, likely remote ischemia, however new from 2018. Electronically Signed   By: Keith Rake M.D.   On: 09/27/2022 23:03   CT CHEST ABDOMEN PELVIS W CONTRAST  Result Date: 09/27/2022 CLINICAL  DATA:  Multiple recent falls while on blood thinners, initial encounter EXAM: CT CHEST, ABDOMEN, AND PELVIS WITH CONTRAST TECHNIQUE: Multidetector CT imaging of the chest, abdomen and pelvis was performed following the standard protocol during bolus administration of intravenous contrast. RADIATION DOSE REDUCTION: This exam was performed according to the departmental dose-optimization program which includes automated exposure control, adjustment of the mA and/or kV according to patient size and/or use of iterative reconstruction technique. CONTRAST:  54m OMNIPAQUE IOHEXOL 350 MG/ML SOLN COMPARISON:  08/27/2019 FINDINGS: CT CHEST FINDINGS Cardiovascular: Atherosclerotic calcifications of the thoracic aorta are noted. Coronary calcifications are seen as well as changes of prior coronary bypass grafting. No cardiac enlargement is seen. The pulmonary artery shows no filling defect although not timed for true embolus evaluation. Mediastinum/Nodes: Thoracic inlet is within normal limits. No hilar or mediastinal adenopathy is noted. The esophagus as visualized is within normal limits. 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 No focal infiltrate or sizable effusion is noted. No sizable parenchymal nodule is seen. Musculoskeletal: Degenerative changes of the thoracic spine are noted. No acute rib abnormality is seen. CT ABDOMEN PELVIS FINDINGS Hepatobiliary: Fatty infiltration of the liver is noted. Mild nodularity of the liver is noted as well. No focal mass is seen. Gallstones are seen within the gallbladder without complicating factors. Pancreas: Unremarkable. No pancreatic ductal dilatation or surrounding inflammatory changes. Spleen: Normal in size  without focal abnormality. 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 This measures approximately 11 mm. The kidneys show no renal calculi or obstructive changes. Normal enhancement is noted bilaterally. The  bladder is well distended. Stomach/Bowel: Scattered diverticular change of the colon is noted without evidence of diverticulitis. No obstructive changes are seen. The appendix is within normal limits. Small bowel and stomach are unremarkable. Vascular/Lymphatic: Aortic atherosclerosis. No enlarged abdominal or pelvic lymph nodes. Reproductive: Prostate is enlarged. Other: No abdominal wall hernia or abnormality. No abdominopelvic ascites. Musculoskeletal: Degenerative changes of lumbar spine are seen. IMPRESSION: CT of the chest: No acute intrathoracic abnormality is noted. 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 No follow-up imaging is recommended. JACR 2017 Aug; 14(8):1038-44, JCAT 2016 Mar-Apr; 40(2):194-200, Urol J 2006 Spring; 3(2):71-4. Diverticulosis without diverticulitis. Cholelithiasis without complicating factors. No acute abnormality to correspond with the given clinical history. Electronically Signed   By: Inez Catalina M.D.   On: 09/27/2022 23:01   DG Pelvis Portable  Result Date: 09/27/2022 CLINICAL DATA:  Trauma, fall. EXAM: PORTABLE PELVIS 1-2 VIEWS COMPARISON:  None Available. FINDINGS: The cortical margins of the bony pelvis are intact. No fracture. Pubic symphysis and sacroiliac joints are congruent. Both femoral heads are well-seated in the respective acetabula. IMPRESSION: No pelvic fracture. Electronically Signed   By: Keith Rake M.D.   On: 09/27/2022 22:30   DG Chest Port 1 View  Result Date: 09/27/2022 CLINICAL DATA:  Trauma, fall. EXAM: PORTABLE CHEST 1 VIEW COMPARISON:  Radiograph 08/18/2021 FINDINGS: Prior median sternotomy and CABG.The cardiomediastinal contours are normal. The lungs are clear. Pulmonary vasculature is normal. No consolidation, pleural effusion, or pneumothorax. No acute osseous abnormalities are seen. Left chest wall loop recorder. IMPRESSION: No acute chest findings  or evidence of traumatic injury. Electronically Signed   By: Keith Rake M.D.   On: 09/27/2022 22:29    Microbiology: Results for orders placed or performed during the hospital encounter of 08/27/19  SARS CORONAVIRUS 2 (TAT 6-24 HRS) Nasopharyngeal Nasopharyngeal Swab     Status: None   Collection Time: 08/27/19  3:25 PM   Specimen: Nasopharyngeal Swab  Result Value Ref Range Status   SARS Coronavirus 2 NEGATIVE NEGATIVE Final    Comment: (NOTE) SARS-CoV-2 target nucleic acids are NOT DETECTED. The SARS-CoV-2 RNA is generally detectable in upper and lower respiratory specimens during the acute phase of infection. Negative results do not preclude SARS-CoV-2 infection, do not rule out co-infections with other pathogens, and should not be used as the sole basis for treatment or other patient management decisions. Negative results must be combined with clinical observations, patient history, and epidemiological information. The expected result is Negative. Fact Sheet for Patients: SugarRoll.be Fact Sheet for Healthcare Providers: https://www.woods-mathews.com/ This test is not yet approved or cleared by the [place] FDA and  has been authorized for detection and/or diagnosis of SARS-CoV-2 by FDA under an Emergency Use Authorization (EUA). This EUA will remain  in effect (meaning this test can be used) for the duration of the COVID-19 declaration under Section 56 4(b)(1) of the Act, 21 U.S.C. section 360bbb-3(b)(1), unless the authorization is terminated or revoked sooner. Performed at [facility], [place] [address], [place], [place] [address]     Labs: CBC: Recent Labs  Lab 09/27/22 2206 09/27/22 2212 09/28/22 0345  WBC 9.8  --  12.0*  HGB 15.2 15.6  16.3 13.3  HCT 45.6 46.0  48.0 41.4  MCV 77.8*  --  80.1  PLT 265  --  010   Basic Metabolic Panel: Recent Labs  Lab 09/27/22 2206 09/27/22 2212 09/28/22 0345  09/29/22 0233 09/30/22 0124 10/02/22 0242  NA 137 135  136 139 139 138 137  K 4.3 4.5  4.5 3.3* 3.6 4.1 3.9  CL 96* 101 103 105 102 105  CO2 20*  --  20* '23 24 22  '$ GLUCOSE 286* 291* 228* 152* 176* 237*  BUN 100* 97* 95* 88* 73* 62*  CREATININE 3.99* 4.20* 3.66* 2.92* 2.70* 2.26*  CALCIUM 9.3  --  8.7* 8.5* 8.5* 8.4*  MG  --   --  3.4*  --   --   --   PHOS  --   --   --  4.6  --  2.8   Liver Function Tests: Recent Labs  Lab 09/27/22 2206 09/28/22 0345 09/29/22 0233 10/02/22 0242  AST 44* 24  --   --   ALT 28 24  --   --   ALKPHOS 75 67  --   --   BILITOT 1.7* 1.5*  --   --   PROT 7.9 7.0  --   --   ALBUMIN 3.7 3.2* 2.9* 2.7*   CBG: Recent Labs  Lab 10/02/22 1108 10/02/22 1540 10/02/22 1809 10/02/22 2138 10/03/22 0759  GLUCAP 254* 256* 341* 311* 241*    Discharge time spent: greater than 30 minutes.  Signed: Cristela Felt, MD [facility] 10/03/2022

## 2022-10-03 NOTE — Progress Notes (Signed)
Report called and given to [facility], Therapist, sports.

## 2022-10-03 NOTE — TOC Transition Note (Addendum)
Transition of Care [facility]) - CM/SW Discharge Note   Patient Details  Name: Jake Gross MRN: [id number] Date of Birth: 03/22/1941  [facility] CM/SW Contact:  Milas Gain, [place] Phone Number: 10/03/2022, 12:51 PM   Clinical Narrative:     Patient will DC to: [facility]   Anticipated DC date: 10/03/2022  Family notified: Marita [place]   Transport by: Corey Harold   ?  Per MD patient ready for [facility] to [facility]. RN, patient, patient's family, and facility notified of DC. Discharge Summary sent to facility. RN given number for report tele# [phone] RM# [phone]. DC packet on chart. Ambulance transport requested for patient.  CSW signing off.   Final next level of care: Skilled Nursing Facility Barriers to Discharge: No Barriers Identified   Patient Goals and CMS Choice Patient states their goals for this hospitalization and ongoing recovery are:: SNF CMS Medicare.gov Compare Post Acute Care list provided to:: Patient Choice offered to / list presented to : Patient  Discharge Placement              Patient chooses bed at: [facility] Patient to be transferred to facility by: [place] Name of family member notified: Marita [place] Patient and family notified of of transfer: 10/03/22  Discharge Plan and Services In-house Referral: Clinical Social Work                                   Social Determinants of Health ([place]) Interventions     Readmission Risk Interventions     No data to display

## 2022-10-04 DIAGNOSIS — N184 Chronic kidney disease, stage 4 (severe): Secondary | ICD-10-CM | POA: Diagnosis not present

## 2022-10-04 DIAGNOSIS — I5032 Chronic diastolic (congestive) heart failure: Secondary | ICD-10-CM | POA: Diagnosis not present

## 2022-10-04 DIAGNOSIS — R296 Repeated falls: Secondary | ICD-10-CM | POA: Diagnosis not present

## 2022-10-04 DIAGNOSIS — I130 Hypertensive heart and chronic kidney disease with heart failure and stage 1 through stage 4 chronic kidney disease, or unspecified chronic kidney disease: Secondary | ICD-10-CM | POA: Diagnosis not present

## 2022-10-04 DIAGNOSIS — I480 Paroxysmal atrial fibrillation: Secondary | ICD-10-CM | POA: Diagnosis not present

## 2022-10-04 DIAGNOSIS — E1122 Type 2 diabetes mellitus with diabetic chronic kidney disease: Secondary | ICD-10-CM | POA: Diagnosis not present

## 2022-10-04 DIAGNOSIS — M6281 Muscle weakness (generalized): Secondary | ICD-10-CM | POA: Diagnosis not present

## 2022-10-04 DIAGNOSIS — L2089 Other atopic dermatitis: Secondary | ICD-10-CM | POA: Diagnosis not present

## 2022-10-06 DIAGNOSIS — N401 Enlarged prostate with lower urinary tract symptoms: Secondary | ICD-10-CM | POA: Diagnosis not present

## 2022-10-06 DIAGNOSIS — L2089 Other atopic dermatitis: Secondary | ICD-10-CM | POA: Diagnosis not present

## 2022-10-06 DIAGNOSIS — N184 Chronic kidney disease, stage 4 (severe): Secondary | ICD-10-CM | POA: Diagnosis not present

## 2022-10-06 DIAGNOSIS — E785 Hyperlipidemia, unspecified: Secondary | ICD-10-CM | POA: Diagnosis not present

## 2022-10-06 DIAGNOSIS — M6281 Muscle weakness (generalized): Secondary | ICD-10-CM | POA: Diagnosis not present

## 2022-10-06 DIAGNOSIS — I5032 Chronic diastolic (congestive) heart failure: Secondary | ICD-10-CM | POA: Diagnosis not present

## 2022-10-06 DIAGNOSIS — I5022 Chronic systolic (congestive) heart failure: Secondary | ICD-10-CM | POA: Diagnosis not present

## 2022-10-06 DIAGNOSIS — E1122 Type 2 diabetes mellitus with diabetic chronic kidney disease: Secondary | ICD-10-CM | POA: Diagnosis not present

## 2022-10-06 DIAGNOSIS — R296 Repeated falls: Secondary | ICD-10-CM | POA: Diagnosis not present

## 2022-10-06 DIAGNOSIS — N179 Acute kidney failure, unspecified: Secondary | ICD-10-CM | POA: Diagnosis not present

## 2022-10-06 DIAGNOSIS — I480 Paroxysmal atrial fibrillation: Secondary | ICD-10-CM | POA: Diagnosis not present

## 2022-10-06 DIAGNOSIS — R278 Other lack of coordination: Secondary | ICD-10-CM | POA: Diagnosis not present

## 2022-10-06 DIAGNOSIS — I130 Hypertensive heart and chronic kidney disease with heart failure and stage 1 through stage 4 chronic kidney disease, or unspecified chronic kidney disease: Secondary | ICD-10-CM | POA: Diagnosis not present

## 2022-10-10 DIAGNOSIS — N179 Acute kidney failure, unspecified: Secondary | ICD-10-CM | POA: Diagnosis not present

## 2022-10-10 DIAGNOSIS — E785 Hyperlipidemia, unspecified: Secondary | ICD-10-CM | POA: Diagnosis not present

## 2022-10-10 DIAGNOSIS — R600 Localized edema: Secondary | ICD-10-CM | POA: Diagnosis not present

## 2022-10-10 DIAGNOSIS — N401 Enlarged prostate with lower urinary tract symptoms: Secondary | ICD-10-CM | POA: Diagnosis not present

## 2022-10-10 DIAGNOSIS — M6281 Muscle weakness (generalized): Secondary | ICD-10-CM | POA: Diagnosis not present

## 2022-10-10 DIAGNOSIS — S81001A Unspecified open wound, right knee, initial encounter: Secondary | ICD-10-CM | POA: Diagnosis not present

## 2022-10-10 DIAGNOSIS — R278 Other lack of coordination: Secondary | ICD-10-CM | POA: Diagnosis not present

## 2022-10-10 DIAGNOSIS — I5032 Chronic diastolic (congestive) heart failure: Secondary | ICD-10-CM | POA: Diagnosis not present

## 2022-10-10 DIAGNOSIS — S81002A Unspecified open wound, left knee, initial encounter: Secondary | ICD-10-CM | POA: Diagnosis not present

## 2022-10-10 DIAGNOSIS — I480 Paroxysmal atrial fibrillation: Secondary | ICD-10-CM | POA: Diagnosis not present

## 2022-10-10 DIAGNOSIS — L2089 Other atopic dermatitis: Secondary | ICD-10-CM | POA: Diagnosis not present

## 2022-10-10 DIAGNOSIS — I130 Hypertensive heart and chronic kidney disease with heart failure and stage 1 through stage 4 chronic kidney disease, or unspecified chronic kidney disease: Secondary | ICD-10-CM | POA: Diagnosis not present

## 2022-10-10 DIAGNOSIS — S51001A Unspecified open wound of right elbow, initial encounter: Secondary | ICD-10-CM | POA: Diagnosis not present

## 2022-10-10 DIAGNOSIS — M25511 Pain in right shoulder: Secondary | ICD-10-CM | POA: Diagnosis not present

## 2022-10-10 DIAGNOSIS — I5022 Chronic systolic (congestive) heart failure: Secondary | ICD-10-CM | POA: Diagnosis not present

## 2022-10-10 DIAGNOSIS — N184 Chronic kidney disease, stage 4 (severe): Secondary | ICD-10-CM | POA: Diagnosis not present

## 2022-10-10 DIAGNOSIS — R296 Repeated falls: Secondary | ICD-10-CM | POA: Diagnosis not present

## 2022-10-10 DIAGNOSIS — E1122 Type 2 diabetes mellitus with diabetic chronic kidney disease: Secondary | ICD-10-CM | POA: Diagnosis not present

## 2022-10-11 DIAGNOSIS — M25511 Pain in right shoulder: Secondary | ICD-10-CM | POA: Diagnosis not present

## 2022-10-11 DIAGNOSIS — I5032 Chronic diastolic (congestive) heart failure: Secondary | ICD-10-CM | POA: Diagnosis not present

## 2022-10-11 DIAGNOSIS — I480 Paroxysmal atrial fibrillation: Secondary | ICD-10-CM | POA: Diagnosis not present

## 2022-10-11 DIAGNOSIS — R296 Repeated falls: Secondary | ICD-10-CM | POA: Diagnosis not present

## 2022-10-11 DIAGNOSIS — M6281 Muscle weakness (generalized): Secondary | ICD-10-CM | POA: Diagnosis not present

## 2022-10-11 DIAGNOSIS — I130 Hypertensive heart and chronic kidney disease with heart failure and stage 1 through stage 4 chronic kidney disease, or unspecified chronic kidney disease: Secondary | ICD-10-CM | POA: Diagnosis not present

## 2022-10-11 DIAGNOSIS — E1122 Type 2 diabetes mellitus with diabetic chronic kidney disease: Secondary | ICD-10-CM | POA: Diagnosis not present

## 2022-10-11 DIAGNOSIS — L2089 Other atopic dermatitis: Secondary | ICD-10-CM | POA: Diagnosis not present

## 2022-10-11 DIAGNOSIS — R600 Localized edema: Secondary | ICD-10-CM | POA: Diagnosis not present

## 2022-10-11 DIAGNOSIS — N184 Chronic kidney disease, stage 4 (severe): Secondary | ICD-10-CM | POA: Diagnosis not present

## 2022-10-12 DIAGNOSIS — I130 Hypertensive heart and chronic kidney disease with heart failure and stage 1 through stage 4 chronic kidney disease, or unspecified chronic kidney disease: Secondary | ICD-10-CM | POA: Diagnosis not present

## 2022-10-12 DIAGNOSIS — I480 Paroxysmal atrial fibrillation: Secondary | ICD-10-CM | POA: Diagnosis not present

## 2022-10-12 DIAGNOSIS — M6281 Muscle weakness (generalized): Secondary | ICD-10-CM | POA: Diagnosis not present

## 2022-10-12 DIAGNOSIS — N184 Chronic kidney disease, stage 4 (severe): Secondary | ICD-10-CM | POA: Diagnosis not present

## 2022-10-12 DIAGNOSIS — R296 Repeated falls: Secondary | ICD-10-CM | POA: Diagnosis not present

## 2022-10-12 DIAGNOSIS — E1122 Type 2 diabetes mellitus with diabetic chronic kidney disease: Secondary | ICD-10-CM | POA: Diagnosis not present

## 2022-10-12 DIAGNOSIS — M25511 Pain in right shoulder: Secondary | ICD-10-CM | POA: Diagnosis not present

## 2022-10-12 DIAGNOSIS — I5032 Chronic diastolic (congestive) heart failure: Secondary | ICD-10-CM | POA: Diagnosis not present

## 2022-10-12 DIAGNOSIS — R600 Localized edema: Secondary | ICD-10-CM | POA: Diagnosis not present

## 2022-10-13 DIAGNOSIS — N184 Chronic kidney disease, stage 4 (severe): Secondary | ICD-10-CM | POA: Diagnosis not present

## 2022-10-13 DIAGNOSIS — M6281 Muscle weakness (generalized): Secondary | ICD-10-CM | POA: Diagnosis not present

## 2022-10-13 DIAGNOSIS — I130 Hypertensive heart and chronic kidney disease with heart failure and stage 1 through stage 4 chronic kidney disease, or unspecified chronic kidney disease: Secondary | ICD-10-CM | POA: Diagnosis not present

## 2022-10-13 DIAGNOSIS — E785 Hyperlipidemia, unspecified: Secondary | ICD-10-CM | POA: Diagnosis not present

## 2022-10-13 DIAGNOSIS — I5032 Chronic diastolic (congestive) heart failure: Secondary | ICD-10-CM | POA: Diagnosis not present

## 2022-10-13 DIAGNOSIS — M25511 Pain in right shoulder: Secondary | ICD-10-CM | POA: Diagnosis not present

## 2022-10-13 DIAGNOSIS — E1122 Type 2 diabetes mellitus with diabetic chronic kidney disease: Secondary | ICD-10-CM | POA: Diagnosis not present

## 2022-10-13 DIAGNOSIS — N401 Enlarged prostate with lower urinary tract symptoms: Secondary | ICD-10-CM | POA: Diagnosis not present

## 2022-10-13 DIAGNOSIS — R600 Localized edema: Secondary | ICD-10-CM | POA: Diagnosis not present

## 2022-10-13 DIAGNOSIS — N179 Acute kidney failure, unspecified: Secondary | ICD-10-CM | POA: Diagnosis not present

## 2022-10-13 DIAGNOSIS — I5022 Chronic systolic (congestive) heart failure: Secondary | ICD-10-CM | POA: Diagnosis not present

## 2022-10-13 DIAGNOSIS — R296 Repeated falls: Secondary | ICD-10-CM | POA: Diagnosis not present

## 2022-10-13 DIAGNOSIS — R278 Other lack of coordination: Secondary | ICD-10-CM | POA: Diagnosis not present

## 2022-10-13 DIAGNOSIS — I480 Paroxysmal atrial fibrillation: Secondary | ICD-10-CM | POA: Diagnosis not present

## 2022-10-17 DIAGNOSIS — N179 Acute kidney failure, unspecified: Secondary | ICD-10-CM | POA: Diagnosis not present

## 2022-10-17 DIAGNOSIS — S51001A Unspecified open wound of right elbow, initial encounter: Secondary | ICD-10-CM | POA: Diagnosis not present

## 2022-10-17 DIAGNOSIS — S81001A Unspecified open wound, right knee, initial encounter: Secondary | ICD-10-CM | POA: Diagnosis not present

## 2022-10-17 DIAGNOSIS — N401 Enlarged prostate with lower urinary tract symptoms: Secondary | ICD-10-CM | POA: Diagnosis not present

## 2022-10-17 DIAGNOSIS — R278 Other lack of coordination: Secondary | ICD-10-CM | POA: Diagnosis not present

## 2022-10-17 DIAGNOSIS — E785 Hyperlipidemia, unspecified: Secondary | ICD-10-CM | POA: Diagnosis not present

## 2022-10-17 DIAGNOSIS — S81002A Unspecified open wound, left knee, initial encounter: Secondary | ICD-10-CM | POA: Diagnosis not present

## 2022-10-17 DIAGNOSIS — I5022 Chronic systolic (congestive) heart failure: Secondary | ICD-10-CM | POA: Diagnosis not present

## 2022-10-17 DIAGNOSIS — I480 Paroxysmal atrial fibrillation: Secondary | ICD-10-CM | POA: Diagnosis not present

## 2022-10-18 DIAGNOSIS — R600 Localized edema: Secondary | ICD-10-CM | POA: Diagnosis not present

## 2022-10-18 DIAGNOSIS — M6281 Muscle weakness (generalized): Secondary | ICD-10-CM | POA: Diagnosis not present

## 2022-10-18 DIAGNOSIS — E1122 Type 2 diabetes mellitus with diabetic chronic kidney disease: Secondary | ICD-10-CM | POA: Diagnosis not present

## 2022-10-18 DIAGNOSIS — I130 Hypertensive heart and chronic kidney disease with heart failure and stage 1 through stage 4 chronic kidney disease, or unspecified chronic kidney disease: Secondary | ICD-10-CM | POA: Diagnosis not present

## 2022-10-18 DIAGNOSIS — I480 Paroxysmal atrial fibrillation: Secondary | ICD-10-CM | POA: Diagnosis not present

## 2022-10-18 DIAGNOSIS — I5032 Chronic diastolic (congestive) heart failure: Secondary | ICD-10-CM | POA: Diagnosis not present

## 2022-10-18 DIAGNOSIS — R001 Bradycardia, unspecified: Secondary | ICD-10-CM | POA: Diagnosis not present

## 2022-10-18 DIAGNOSIS — R296 Repeated falls: Secondary | ICD-10-CM | POA: Diagnosis not present

## 2022-10-18 DIAGNOSIS — M25511 Pain in right shoulder: Secondary | ICD-10-CM | POA: Diagnosis not present

## 2022-10-18 DIAGNOSIS — N184 Chronic kidney disease, stage 4 (severe): Secondary | ICD-10-CM | POA: Diagnosis not present

## 2022-10-20 DIAGNOSIS — I480 Paroxysmal atrial fibrillation: Secondary | ICD-10-CM | POA: Diagnosis not present

## 2022-10-20 DIAGNOSIS — E785 Hyperlipidemia, unspecified: Secondary | ICD-10-CM | POA: Diagnosis not present

## 2022-10-20 DIAGNOSIS — N401 Enlarged prostate with lower urinary tract symptoms: Secondary | ICD-10-CM | POA: Diagnosis not present

## 2022-10-20 DIAGNOSIS — I5022 Chronic systolic (congestive) heart failure: Secondary | ICD-10-CM | POA: Diagnosis not present

## 2022-10-20 DIAGNOSIS — N179 Acute kidney failure, unspecified: Secondary | ICD-10-CM | POA: Diagnosis not present

## 2022-10-20 DIAGNOSIS — R278 Other lack of coordination: Secondary | ICD-10-CM | POA: Diagnosis not present

## 2022-10-23 DIAGNOSIS — R5383 Other fatigue: Secondary | ICD-10-CM | POA: Diagnosis not present

## 2022-10-23 DIAGNOSIS — R509 Fever, unspecified: Secondary | ICD-10-CM | POA: Diagnosis not present

## 2022-10-23 DIAGNOSIS — R051 Acute cough: Secondary | ICD-10-CM | POA: Diagnosis not present

## 2022-10-23 DIAGNOSIS — R062 Wheezing: Secondary | ICD-10-CM | POA: Diagnosis not present

## 2022-10-23 DIAGNOSIS — R5381 Other malaise: Secondary | ICD-10-CM | POA: Diagnosis not present

## 2022-10-24 ENCOUNTER — Other Ambulatory Visit: Payer: Self-pay | Admitting: *Deleted

## 2022-10-24 DIAGNOSIS — I480 Paroxysmal atrial fibrillation: Secondary | ICD-10-CM | POA: Diagnosis not present

## 2022-10-24 DIAGNOSIS — N401 Enlarged prostate with lower urinary tract symptoms: Secondary | ICD-10-CM | POA: Diagnosis not present

## 2022-10-24 DIAGNOSIS — S81002A Unspecified open wound, left knee, initial encounter: Secondary | ICD-10-CM | POA: Diagnosis not present

## 2022-10-24 DIAGNOSIS — I5022 Chronic systolic (congestive) heart failure: Secondary | ICD-10-CM | POA: Diagnosis not present

## 2022-10-24 DIAGNOSIS — N179 Acute kidney failure, unspecified: Secondary | ICD-10-CM | POA: Diagnosis not present

## 2022-10-24 DIAGNOSIS — E785 Hyperlipidemia, unspecified: Secondary | ICD-10-CM | POA: Diagnosis not present

## 2022-10-24 DIAGNOSIS — R278 Other lack of coordination: Secondary | ICD-10-CM | POA: Diagnosis not present

## 2022-10-24 NOTE — Patient Outreach (Addendum)
[name] currently resides in [place] skilled nursing facility. [name] has been active with [facility] care coordination team.   Previous update received from [facility] skilled nursing facility social worker, Deseree. [name] likely to transition home soon. He is from home alone. Writer attempted to reach [name] twice by phone at [phone]. The phone would ring and then disconnect. Unable to leave voicemail message.   Will continue to follow and collaborate with skilled nursing social worker. Will keep [facility] RN care coordinator updated.   Addendum: Spoke with Deseree, skilled nursing facility social worker. [name] tested positive for flu today. Unsure if this will change plans of anticipated dc date of 10/31/22. Will continue to follow.   Marthenia Rolling, MSN, RN,BSN [place] Acute Care Coordinator [phone] (Direct dial)

## 2022-10-25 DIAGNOSIS — I130 Hypertensive heart and chronic kidney disease with heart failure and stage 1 through stage 4 chronic kidney disease, or unspecified chronic kidney disease: Secondary | ICD-10-CM | POA: Diagnosis not present

## 2022-10-25 DIAGNOSIS — R296 Repeated falls: Secondary | ICD-10-CM | POA: Diagnosis not present

## 2022-10-25 DIAGNOSIS — R001 Bradycardia, unspecified: Secondary | ICD-10-CM | POA: Diagnosis not present

## 2022-10-25 DIAGNOSIS — I480 Paroxysmal atrial fibrillation: Secondary | ICD-10-CM | POA: Diagnosis not present

## 2022-10-25 DIAGNOSIS — J09X2 Influenza due to identified novel influenza A virus with other respiratory manifestations: Secondary | ICD-10-CM | POA: Diagnosis not present

## 2022-10-25 DIAGNOSIS — R600 Localized edema: Secondary | ICD-10-CM | POA: Diagnosis not present

## 2022-10-25 DIAGNOSIS — I5032 Chronic diastolic (congestive) heart failure: Secondary | ICD-10-CM | POA: Diagnosis not present

## 2022-10-25 DIAGNOSIS — M25511 Pain in right shoulder: Secondary | ICD-10-CM | POA: Diagnosis not present

## 2022-10-25 DIAGNOSIS — N184 Chronic kidney disease, stage 4 (severe): Secondary | ICD-10-CM | POA: Diagnosis not present

## 2022-10-25 DIAGNOSIS — E1122 Type 2 diabetes mellitus with diabetic chronic kidney disease: Secondary | ICD-10-CM | POA: Diagnosis not present

## 2022-10-25 DIAGNOSIS — M6281 Muscle weakness (generalized): Secondary | ICD-10-CM | POA: Diagnosis not present

## 2022-10-27 DIAGNOSIS — N179 Acute kidney failure, unspecified: Secondary | ICD-10-CM | POA: Diagnosis not present

## 2022-10-27 DIAGNOSIS — E785 Hyperlipidemia, unspecified: Secondary | ICD-10-CM | POA: Diagnosis not present

## 2022-10-27 DIAGNOSIS — N401 Enlarged prostate with lower urinary tract symptoms: Secondary | ICD-10-CM | POA: Diagnosis not present

## 2022-10-27 DIAGNOSIS — I480 Paroxysmal atrial fibrillation: Secondary | ICD-10-CM | POA: Diagnosis not present

## 2022-10-27 DIAGNOSIS — R278 Other lack of coordination: Secondary | ICD-10-CM | POA: Diagnosis not present

## 2022-10-27 DIAGNOSIS — I5022 Chronic systolic (congestive) heart failure: Secondary | ICD-10-CM | POA: Diagnosis not present

## 2022-10-30 ENCOUNTER — Ambulatory Visit: Payer: Medicare Other | Admitting: Nurse Practitioner

## 2022-10-31 ENCOUNTER — Other Ambulatory Visit: Payer: Medicare Other

## 2022-10-31 ENCOUNTER — Other Ambulatory Visit: Payer: Self-pay | Admitting: *Deleted

## 2022-10-31 DIAGNOSIS — R278 Other lack of coordination: Secondary | ICD-10-CM | POA: Diagnosis not present

## 2022-10-31 DIAGNOSIS — N401 Enlarged prostate with lower urinary tract symptoms: Secondary | ICD-10-CM | POA: Diagnosis not present

## 2022-10-31 DIAGNOSIS — E785 Hyperlipidemia, unspecified: Secondary | ICD-10-CM | POA: Diagnosis not present

## 2022-10-31 DIAGNOSIS — I5022 Chronic systolic (congestive) heart failure: Secondary | ICD-10-CM | POA: Diagnosis not present

## 2022-10-31 DIAGNOSIS — I480 Paroxysmal atrial fibrillation: Secondary | ICD-10-CM | POA: Diagnosis not present

## 2022-10-31 DIAGNOSIS — N179 Acute kidney failure, unspecified: Secondary | ICD-10-CM | POA: Diagnosis not present

## 2022-10-31 NOTE — Patient Outreach (Signed)
[place] Coordinator follow up. Mr. Wehner resides in [facility]. Active with [facility] care coordination team prior.   Update received from [facility] skilled nursing facility social worker. Mr. Ambs will transition to home today 10/31/22. Will have [facility] home health.   Will send update to [place] of discharge date from SNF.   Marthenia Rolling, MSN, RN,BSN [place] Acute Care Coordinator [phone] (Direct dial)

## 2022-11-01 ENCOUNTER — Ambulatory Visit: Payer: Medicare Other | Admitting: Orthopaedic Surgery

## 2022-11-02 ENCOUNTER — Ambulatory Visit: Payer: Self-pay

## 2022-11-02 DIAGNOSIS — D472 Monoclonal gammopathy: Secondary | ICD-10-CM | POA: Diagnosis not present

## 2022-11-02 DIAGNOSIS — Z951 Presence of aortocoronary bypass graft: Secondary | ICD-10-CM | POA: Diagnosis not present

## 2022-11-02 DIAGNOSIS — E785 Hyperlipidemia, unspecified: Secondary | ICD-10-CM | POA: Diagnosis not present

## 2022-11-02 DIAGNOSIS — Z9181 History of falling: Secondary | ICD-10-CM | POA: Diagnosis not present

## 2022-11-02 DIAGNOSIS — N184 Chronic kidney disease, stage 4 (severe): Secondary | ICD-10-CM | POA: Diagnosis not present

## 2022-11-02 DIAGNOSIS — Z87891 Personal history of nicotine dependence: Secondary | ICD-10-CM | POA: Diagnosis not present

## 2022-11-02 DIAGNOSIS — I130 Hypertensive heart and chronic kidney disease with heart failure and stage 1 through stage 4 chronic kidney disease, or unspecified chronic kidney disease: Secondary | ICD-10-CM | POA: Diagnosis not present

## 2022-11-02 DIAGNOSIS — Z8673 Personal history of transient ischemic attack (TIA), and cerebral infarction without residual deficits: Secondary | ICD-10-CM | POA: Diagnosis not present

## 2022-11-02 DIAGNOSIS — I480 Paroxysmal atrial fibrillation: Secondary | ICD-10-CM | POA: Diagnosis not present

## 2022-11-02 DIAGNOSIS — Z794 Long term (current) use of insulin: Secondary | ICD-10-CM | POA: Diagnosis not present

## 2022-11-02 DIAGNOSIS — M1990 Unspecified osteoarthritis, unspecified site: Secondary | ICD-10-CM | POA: Diagnosis not present

## 2022-11-02 DIAGNOSIS — M47892 Other spondylosis, cervical region: Secondary | ICD-10-CM | POA: Diagnosis not present

## 2022-11-02 DIAGNOSIS — Z7901 Long term (current) use of anticoagulants: Secondary | ICD-10-CM | POA: Diagnosis not present

## 2022-11-02 DIAGNOSIS — M6281 Muscle weakness (generalized): Secondary | ICD-10-CM | POA: Diagnosis not present

## 2022-11-02 DIAGNOSIS — E1122 Type 2 diabetes mellitus with diabetic chronic kidney disease: Secondary | ICD-10-CM | POA: Diagnosis not present

## 2022-11-02 DIAGNOSIS — N189 Chronic kidney disease, unspecified: Secondary | ICD-10-CM | POA: Diagnosis not present

## 2022-11-02 DIAGNOSIS — E559 Vitamin D deficiency, unspecified: Secondary | ICD-10-CM | POA: Diagnosis not present

## 2022-11-02 DIAGNOSIS — I2510 Atherosclerotic heart disease of native coronary artery without angina pectoris: Secondary | ICD-10-CM | POA: Diagnosis not present

## 2022-11-02 DIAGNOSIS — D500 Iron deficiency anemia secondary to blood loss (chronic): Secondary | ICD-10-CM | POA: Diagnosis not present

## 2022-11-02 DIAGNOSIS — I5032 Chronic diastolic (congestive) heart failure: Secondary | ICD-10-CM | POA: Diagnosis not present

## 2022-11-02 NOTE — Patient Outreach (Signed)
  Care Coordination   Follow Up Visit Note   11/02/2022 Name: Jake Gross MRN: [id number] DOB: 09/06/1941  Jake Gross is a 82 y.o. year old male who sees Cable, Alyson Locket, NP for primary care. I spoke with  [employer] by phone today.  What matters to the patients health and wellness today?  " Getting stronger"    Goals Addressed             This Visit's Progress    Patient stated: decrease blood pressure and post hospital/SNF discharge follow up       Care Coordination Interventions: Evaluation of current treatment plan related to hypertension / CKD stage 4 and patient's adherence to plan as established by provider :  Patient states he was recently discharged from hospital/ SNF. Patient.states he has his medications and is taking them as prescribed. He states his son fills his pill box weekly therefore he is unable to review medications with RNCM.  Patient states he has not been able to put his walker together so he is expecting his son to help him with it this evening. Patient states he is able to manage his ADL's and acquire his meals. Patient denies any shortness of breath and/ or swelling. Patient states he was told he would have a home health visit today. He states he has not heard from anyone. Patient states he continues to check his blood pressure daily.  Advised patient to continue to adhere to low salt diet.  Patient given contact information for [place] home health and advised to call to determine if services will start today.  Advised to continue monitoring blood pressure at least 3 x per week. Patient reports today's blood pressure reading was 101/68 Reviewed scheduled/ upcoming  provider visits: Per chart review patient is scheduled for follow up visit with his primary care provider on 11/09/22 Assessed for new or ongoing sign/symptoms.   Patient denied any symptoms.  Discussed plans with patient for ongoing care management follow up:  Patient verbally agreed  to next telephone outreach with [facility] for 12/01/22.              SDOH assessments and interventions completed:  No     Care Coordination Interventions:  Yes, provided   Follow up plan: Follow up call scheduled for 12/01/22    Encounter Outcome:  Pt. Visit Completed   Quinn Plowman RN,BSN,CCM [place] [phone] direct line

## 2022-11-06 ENCOUNTER — Other Ambulatory Visit: Payer: Medicare Other

## 2022-11-06 DIAGNOSIS — E1122 Type 2 diabetes mellitus with diabetic chronic kidney disease: Secondary | ICD-10-CM | POA: Diagnosis not present

## 2022-11-06 DIAGNOSIS — I5032 Chronic diastolic (congestive) heart failure: Secondary | ICD-10-CM | POA: Diagnosis not present

## 2022-11-06 DIAGNOSIS — N184 Chronic kidney disease, stage 4 (severe): Secondary | ICD-10-CM | POA: Diagnosis not present

## 2022-11-06 DIAGNOSIS — I480 Paroxysmal atrial fibrillation: Secondary | ICD-10-CM | POA: Diagnosis not present

## 2022-11-06 DIAGNOSIS — I130 Hypertensive heart and chronic kidney disease with heart failure and stage 1 through stage 4 chronic kidney disease, or unspecified chronic kidney disease: Secondary | ICD-10-CM | POA: Diagnosis not present

## 2022-11-06 DIAGNOSIS — I2510 Atherosclerotic heart disease of native coronary artery without angina pectoris: Secondary | ICD-10-CM | POA: Diagnosis not present

## 2022-11-07 ENCOUNTER — Telehealth: Payer: Self-pay | Admitting: Nurse Practitioner

## 2022-11-07 ENCOUNTER — Ambulatory Visit: Payer: Medicare Other | Admitting: Internal Medicine

## 2022-11-07 NOTE — Telephone Encounter (Signed)
Home Health verbal orders Caller Name: [place] Name: [place] number: [phone]  Requesting PT  Frequency: 2x a week for 4wks/ 1x a week for 4wks  Please forward to [facility] pool or providers CMA

## 2022-11-07 NOTE — Telephone Encounter (Signed)
Verbal orders are ok

## 2022-11-07 NOTE — Telephone Encounter (Signed)
Verbal order request approval left on confidential voicemail.

## 2022-11-08 ENCOUNTER — Ambulatory Visit: Payer: Medicare Other | Admitting: Nurse Practitioner

## 2022-11-09 ENCOUNTER — Encounter: Payer: Self-pay | Admitting: Nurse Practitioner

## 2022-11-09 ENCOUNTER — Ambulatory Visit (INDEPENDENT_AMBULATORY_CARE_PROVIDER_SITE_OTHER): Payer: Medicare Other | Admitting: Nurse Practitioner

## 2022-11-09 VITALS — BP 120/68 | HR 54 | Ht 67.0 in | Wt 186.0 lb

## 2022-11-09 DIAGNOSIS — E119 Type 2 diabetes mellitus without complications: Secondary | ICD-10-CM | POA: Diagnosis not present

## 2022-11-09 DIAGNOSIS — I10 Essential (primary) hypertension: Secondary | ICD-10-CM | POA: Diagnosis not present

## 2022-11-09 DIAGNOSIS — E1122 Type 2 diabetes mellitus with diabetic chronic kidney disease: Secondary | ICD-10-CM | POA: Diagnosis not present

## 2022-11-09 DIAGNOSIS — Z794 Long term (current) use of insulin: Secondary | ICD-10-CM | POA: Diagnosis not present

## 2022-11-09 DIAGNOSIS — I5032 Chronic diastolic (congestive) heart failure: Secondary | ICD-10-CM | POA: Diagnosis not present

## 2022-11-09 DIAGNOSIS — E782 Mixed hyperlipidemia: Secondary | ICD-10-CM | POA: Diagnosis not present

## 2022-11-09 DIAGNOSIS — N184 Chronic kidney disease, stage 4 (severe): Secondary | ICD-10-CM

## 2022-11-09 LAB — POCT GLYCOSYLATED HEMOGLOBIN (HGB A1C): Hemoglobin A1C: 6.4 % — AB (ref 4.0–5.6)

## 2022-11-09 MED ORDER — ROSUVASTATIN CALCIUM 20 MG PO TABS: ORAL_TABLET | ORAL | 1 refills | Status: DC

## 2022-11-09 MED ORDER — INSULIN GLARGINE-YFGN 100 UNIT/ML ~~LOC~~ SOPN: 55.0000 [IU] | PEN_INJECTOR | Freq: Every day | SUBCUTANEOUS | 0 refills | Status: DC

## 2022-11-09 NOTE — Assessment & Plan Note (Signed)
Followed by [facility].  Had acute on chronic renal failure in the hospital pending labs today.  Encourage patient to drink enough fluid for urine to be yellow to pale yellow

## 2022-11-09 NOTE — Assessment & Plan Note (Signed)
A1c 6.4% in office today.  Will decrease Semglee from 60 units to 55 units.  Follow-up in 3 months

## 2022-11-09 NOTE — Progress Notes (Signed)
Established Patient Office Visit  Subjective   Patient ID: Jake Gross, male    DOB: 05-Oct-1941  Age: 82 y.o. MRN: [id number]  Chief Complaint  Patient presents with   Follow-up    Admission 09/28/23-13/19-24 then to SNF    HPI  Hospital follow up: patient was admitted on 09/27/2022 through 10/03/2022. Admitted for acute on chronic renal failure. He was weak, lightheaded, and falling.  He was discharged to a SNIF and was there for approx 6 weeks. Home since last Tuesday   States that he is at home alone and eating and drinking good. States that he did PT at the nursing facilty   DM2: checks glucose daily in the am and semglee. States that he takes 60 units a day and no low glucose. States that he averages 83-100s lower 100s   Nephrology: sees [facility]. States that he saw them approx 3 months ago. States that he had to cancel while in the SNIF   Review of Systems  Constitutional:  Negative for chills and fever.  Respiratory:  Negative for shortness of breath.   Cardiovascular:  Negative for chest pain.  Neurological:  Negative for dizziness, tingling and headaches.  Psychiatric/Behavioral:  Negative for hallucinations and suicidal ideas.       Objective:     BP 120/68   Pulse (!) 54   Ht '5\' 7"'$  (1.702 m)   Wt 186 lb (84.4 kg)   SpO2 96%   BMI 29.13 kg/m    Physical Exam Vitals and nursing note reviewed.  Constitutional:      Appearance: Normal appearance. He is obese.  HENT:     Right Ear: Tympanic membrane, ear canal and external ear normal.     Left Ear: Tympanic membrane, ear canal and external ear normal.     Mouth/Throat:     Mouth: Mucous membranes are dry.     Pharynx: Oropharynx is clear.  Eyes:     Extraocular Movements: Extraocular movements intact.     Pupils: Pupils are equal, round, and reactive to light.  Cardiovascular:     Rate and Rhythm: Normal rate and regular rhythm.     Heart sounds: Normal heart sounds.  Pulmonary:      Effort: Pulmonary effort is normal.     Breath sounds: Normal breath sounds.  Musculoskeletal:     Right lower leg: No edema.     Left lower leg: No edema.  Lymphadenopathy:     Cervical: No cervical adenopathy.  Skin:    General: Skin is warm.  Neurological:     Mental Status: He is alert.    Diabetic Foot Form - Detailed   Diabetic Foot Exam - detailed Diabetic Foot exam was performed with the following findings: Yes 11/09/2022  4:29 PM  Visual Foot Exam completed.: Yes  Is there swelling or and abnormal foot shape?: No Is there a claw toe deformity?: No Is there elevated skin temparature?: No Pulse Foot Exam completed.: Yes   Right posterior Tibialias: Present Left posterior Tibialias: Present   Right Dorsalis Pedis: Present Left Dorsalis Pedis: Present  Sensory Foot Exam Completed.: Yes Semmes-Weinstein Monofilament Test R Site 1-Great Toe: Neg L Site 1-Great Toe: Pos           Results for orders placed or performed in visit on 11/09/22  HgB A1c  Result Value Ref Range   Hemoglobin A1C 6.4 (A) 4.0 - 5.6 %   HbA1c POC (<> result, manual entry)  HbA1c, POC (prediabetic range)     HbA1c, POC (controlled diabetic range)        The ASCVD Risk score (Arnett DK, et al., 2019) failed to calculate for the following reasons:   The patient has a prior MI or stroke diagnosis    Assessment & Plan:   Problem List Items Addressed This Visit       Cardiovascular and Mediastinum   Essential hypertension    Patient is back on his home regimen of hydralazine 100 3 times daily along with isosorbide, amlodipine, torsemide, chlorthalidone.  Continue medication as prescribed pending labs      Relevant Medications   torsemide (DEMADEX) 20 MG tablet   Metoprolol Tartrate 37.5 MG TABS   rosuvastatin (CRESTOR) 20 MG tablet   Other Relevant Orders   CBC   Comprehensive metabolic panel   Chronic diastolic CHF (congestive heart failure) ([place])    Has not seen cardiology in a  year and 4 months.  Information given to schedule appoint with Dr. 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  Will decrease Semglee from 60 units to 55 units.  Follow-up in 3 months      Relevant Medications   rosuvastatin (CRESTOR) 20 MG tablet   insulin glargine-yfgn (SEMGLEE) 100 UNIT/ML Pen   Diabetes mellitus type 2, insulin dependent (HCC) - Primary   Relevant Medications   rosuvastatin (CRESTOR) 20 MG tablet   insulin glargine-yfgn (SEMGLEE) 100 UNIT/ML Pen   Other Relevant Orders   HgB A1c (Completed)     Genitourinary   CKD (chronic kidney disease)    Followed by [place] [facility].  Had acute on chronic renal failure in the hospital pending labs today.  Encourage patient to drink enough fluid for urine to be yellow to pale yellow      Relevant Orders   CBC   Comprehensive metabolic panel     Other   HLD (hyperlipidemia)    Patient requesting refill rosuvastatin.  Refill provided      Relevant Medications   torsemide (DEMADEX) 20 MG tablet   Metoprolol Tartrate 37.5 MG TABS   rosuvastatin (CRESTOR) 20 MG tablet    Return in about 3 months (around 02/08/2023) for DM recheck.    Romilda Garret, NP

## 2022-11-09 NOTE — Assessment & Plan Note (Signed)
Has not seen cardiology in a year and 4 months.  Information given to schedule appoint with Dr. Lauree Chandler

## 2022-11-09 NOTE — Patient Instructions (Signed)
Nice to see you today We are decreasing the insulin to 55 units a day Follow up with me in 3 months, sooner if you need me

## 2022-11-09 NOTE — Assessment & Plan Note (Signed)
Patient is back on his home regimen of hydralazine 100 3 times daily along with isosorbide, amlodipine, torsemide, chlorthalidone.  Continue medication as prescribed pending labs

## 2022-11-09 NOTE — Assessment & Plan Note (Signed)
Patient requesting refill rosuvastatin.  Refill provided

## 2022-11-10 LAB — CBC
HCT: 37.7 % — ABNORMAL LOW (ref 39.0–52.0)
Hemoglobin: 12.3 g/dL — ABNORMAL LOW (ref 13.0–17.0)
MCHC: 32.7 g/dL (ref 30.0–36.0)
MCV: 81.7 fl (ref 78.0–100.0)
Platelets: 183 10*3/uL (ref 150.0–400.0)
RBC: 4.62 Mil/uL (ref 4.22–5.81)
RDW: 18.2 % — ABNORMAL HIGH (ref 11.5–15.5)
WBC: 8.8 10*3/uL (ref 4.0–10.5)

## 2022-11-10 LAB — COMPREHENSIVE METABOLIC PANEL
ALT: 11 U/L (ref 0–53)
AST: 17 U/L (ref 0–37)
Albumin: 3.3 g/dL — ABNORMAL LOW (ref 3.5–5.2)
Alkaline Phosphatase: 82 U/L (ref 39–117)
BUN: 60 mg/dL — ABNORMAL HIGH (ref 6–23)
CO2: 24 mEq/L (ref 19–32)
Calcium: 8.5 mg/dL (ref 8.4–10.5)
Chloride: 108 mEq/L (ref 96–112)
Creatinine, Ser: 2.51 mg/dL — ABNORMAL HIGH (ref 0.40–1.50)
GFR: 23.66 mL/min — ABNORMAL LOW (ref >60.00)
Glucose, Bld: 177 mg/dL — ABNORMAL HIGH (ref 70–99)
Potassium: 4.9 mEq/L (ref 3.5–5.1)
Sodium: 140 mEq/L (ref 135–145)
Total Bilirubin: 0.3 mg/dL (ref 0.2–1.2)
Total Protein: 6.6 g/dL (ref 6.0–8.3)

## 2022-11-13 ENCOUNTER — Ambulatory Visit: Payer: Medicare Other | Admitting: Internal Medicine

## 2022-11-15 ENCOUNTER — Telehealth: Payer: Self-pay | Admitting: Nurse Practitioner

## 2022-11-15 DIAGNOSIS — I2510 Atherosclerotic heart disease of native coronary artery without angina pectoris: Secondary | ICD-10-CM | POA: Diagnosis not present

## 2022-11-15 DIAGNOSIS — I5032 Chronic diastolic (congestive) heart failure: Secondary | ICD-10-CM | POA: Diagnosis not present

## 2022-11-15 DIAGNOSIS — E1122 Type 2 diabetes mellitus with diabetic chronic kidney disease: Secondary | ICD-10-CM | POA: Diagnosis not present

## 2022-11-15 DIAGNOSIS — I480 Paroxysmal atrial fibrillation: Secondary | ICD-10-CM | POA: Diagnosis not present

## 2022-11-15 DIAGNOSIS — I130 Hypertensive heart and chronic kidney disease with heart failure and stage 1 through stage 4 chronic kidney disease, or unspecified chronic kidney disease: Secondary | ICD-10-CM | POA: Diagnosis not present

## 2022-11-15 DIAGNOSIS — N184 Chronic kidney disease, stage 4 (severe): Secondary | ICD-10-CM | POA: Diagnosis not present

## 2022-11-15 NOTE — Telephone Encounter (Signed)
Got notice that patient will need an expect ion for the glargine insulin. Is this something the Prior Auth team does or how does that work

## 2022-11-16 ENCOUNTER — Other Ambulatory Visit (HOSPITAL_COMMUNITY): Payer: Self-pay

## 2022-11-16 NOTE — Telephone Encounter (Signed)
It did say a formula exception on the paperwork. So I assume so

## 2022-11-16 NOTE — Telephone Encounter (Signed)
Ran test claim for Semglee, came back refill too soon. Filled 11/11/22, next refill 01/06/23. PA not needed. Was a tier exception needed? Please advise.

## 2022-11-20 ENCOUNTER — Encounter: Payer: Self-pay | Admitting: Cardiovascular Disease

## 2022-11-20 ENCOUNTER — Ambulatory Visit: Payer: Medicare Other | Attending: Cardiovascular Disease | Admitting: Cardiovascular Disease

## 2022-11-20 VITALS — BP 128/70 | HR 57 | Ht 67.0 in | Wt 184.8 lb

## 2022-11-20 DIAGNOSIS — I10 Essential (primary) hypertension: Secondary | ICD-10-CM | POA: Diagnosis not present

## 2022-11-20 DIAGNOSIS — I5032 Chronic diastolic (congestive) heart failure: Secondary | ICD-10-CM | POA: Diagnosis not present

## 2022-11-20 DIAGNOSIS — I2510 Atherosclerotic heart disease of native coronary artery without angina pectoris: Secondary | ICD-10-CM

## 2022-11-20 DIAGNOSIS — I480 Paroxysmal atrial fibrillation: Secondary | ICD-10-CM | POA: Insufficient documentation

## 2022-11-20 NOTE — Progress Notes (Signed)
Chief Complaint  Patient presents with   Follow-up    CAD   History of Present Illness: 82 yo male with history of CAD s/p CABG in February 2020, HTN, HLD, CKD, prior CVA and paroxysmal atrial fibrillation who is here today for cardiac follow up. His atrial fibrillation is followed by Dr. Rayann Heman. He is on Eliqus. He was found to have severe left main and three vessel CAD in February 2020 and underwent 4V CABG in February 2020. Echo February 2020 with LVEF=60-65%. No significant valve disease. 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  GI said it was okay to restart Eliquis.  CHA2DS2-VASc equals 8 2D echo showed normal LV function with mild to moderate MR. He has been on Demadex. 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 Last creatinine 1.88 on 06/27/21. (Baseline 1.9-2.0). I saw him in the office 07/18/21 and he reported worsened dyspnea on exertion and LE edema. His right leg was more swollen than his left leg. Venous dopplers showed no evidence of DVT. He had been off of his Eliquis but was not sure why. He had been taking Torsemide 20 mg daily. Torsemide was increased to 20 mg po BID after 3 days of 40am/20pm. Echo 08/02/21 with LVEF=60-65% with grade 2 diastolic dysfunction. Normal RV function. No significant valve disease. He was admitted to [facility] with weakness in December 2023. No specific findings.   He is here today for follow up. The patient denies any chest pain, dyspnea, palpitations, lower extremity edema, orthopnea, PND, dizziness, near syncope or syncope.   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   CAD (coronary artery disease) 09/29/2019   Cervical spondylosis    Chronic anticoagulation    Chronic diastolic CHF (congestive heart failure) ([place]) 12/17/2018   CKD (chronic kidney disease) 12/17/2018   Diabetes mellitus    History of GI bleed 09/29/2019   Hyperlipidemia    Hypertension    Iron deficiency anemia due to chronic blood loss    Paroxysmal atrial fibrillation ([place]) 12/17/2018   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 NO ISCHEMIA   COLONOSCOPY W/ POLYPECTOMY     5 polyps   COLONOSCOPY WITH PROPOFOL N/A 08/29/2019   Procedure: COLONOSCOPY WITH PROPOFOL;  Surgeon: Doran Stabler, MD;  Location: [place];  Service: Gastroenterology;  Laterality: N/A;   CORONARY ARTERY BYPASS GRAFT N/A 11/26/2018   Procedure: CORONARY ARTERY BYPASS GRAFTING (CABG), ON PUMP, TIMES FOUR, USING LEFT INTERNAL MAMMARY ARTERY AND ENDOSCOPICALLY HARVESTED LEFT SAPHENOUS VEIN;  Surgeon: Gaye Pollack, MD;  Location: [place];  Service: Open Heart Surgery;  Laterality: N/A;   ESOPHAGOGASTRODUODENOSCOPY (EGD) WITH PROPOFOL N/A 08/29/2019   Procedure: ESOPHAGOGASTRODUODENOSCOPY (EGD) WITH PROPOFOL;  Surgeon: Loletha Carrow,  Kirke Corin, MD;  Location: [place];  Service: Gastroenterology;  Laterality: N/A;   EUS N/A 03/11/2015   Procedure: LOWER ENDOSCOPIC ULTRASOUND (EUS);  Surgeon: Milus Banister, MD;  Location: [facility];  Service: Endoscopy;  Laterality: N/A;   FLEXIBLE SIGMOIDOSCOPY N/A 02/02/2015   Procedure: FLEXIBLE SIGMOIDOSCOPY;  Surgeon: Inda Castle, MD;  Location: [facility];  Service: Endoscopy;  Laterality: N/A;  ERBE   HEMOSTASIS CLIP PLACEMENT  08/29/2019    Procedure: HEMOSTASIS CLIP PLACEMENT;  Surgeon: Doran Stabler, MD;  Location: [place] [facility];  Service: Gastroenterology;;   HOT HEMOSTASIS N/A 08/29/2019   Procedure: HOT HEMOSTASIS (ARGON PLASMA COAGULATION/BICAP);  Surgeon: Doran Stabler, MD;  Location: [place];  Service: Gastroenterology;  Laterality: N/A;   LEFT HEART CATH AND CORONARY ANGIOGRAPHY N/A 11/20/2018   Procedure: LEFT HEART CATH AND CORONARY ANGIOGRAPHY;  Surgeon: Burnell Blanks, MD;  Location: [place] [facility];  Service: Cardiovascular;  Laterality: N/A;   LOOP RECORDER INSERTION N/A 08/14/2017   Procedure: LOOP RECORDER INSERTION;  Surgeon: Thompson Grayer, MD;  Location: [place] [facility];  Service: Cardiovascular;  Laterality: N/A;   PARTIAL PROCTECTOMY BY TEM N/A 04/08/2015   Procedure: TEM PARTIAL PROCTECTOMY OF RECTAL MASS;  Surgeon: Michael Boston, MD;  Location: [facility];  Service: General;  Laterality: N/A;   POLYPECTOMY  08/29/2019   Procedure: POLYPECTOMY;  Surgeon: Doran Stabler, MD;  Location: [facility];  Service: Gastroenterology;;   TEE WITHOUT CARDIOVERSION N/A 06/25/2017   Procedure: TRANSESOPHAGEAL ECHOCARDIOGRAM (TEE);  Surgeon: Skeet Latch, MD;  Location: [place];  Service: Cardiovascular;  Laterality: N/A;   TEE WITHOUT CARDIOVERSION N/A 11/26/2018   Procedure: TRANSESOPHAGEAL ECHOCARDIOGRAM (TEE);  Surgeon: Gaye Pollack, MD;  Location: [place];  Service: Open Heart Surgery;  Laterality: N/A;   TONSILLECTOMY AND ADENOIDECTOMY     as child    Current Outpatient Medications  Medication Sig Dispense Refill   amLODipine (NORVASC) 5 MG tablet Take 1 tablet (5 mg total) by mouth daily. Please call to schedule an overdue appointment with Dr. Angelena Form for refills, [phone], thank you. 2ND ATTEMPT 30 tablet 0   chlorthalidone (HYGROTON) 25 MG tablet Take 25 mg by mouth daily.     ELIQUIS 5 MG TABS tablet TAKE 1 TABLET BY MOUTH TWICE A DAY (Patient taking differently: Take 5 mg  by mouth 2 (two) times daily.) 60 tablet 5   hydrALAZINE (APRESOLINE) 100 MG tablet Take 1 tablet (100 mg total) by mouth 3 (three) times daily. Please call to schedule an overdue appointment with Dr. Angelena Form for refills, [phone], thank you. 1st attempt. 90 tablet 0   insulin glargine-yfgn (SEMGLEE) 100 UNIT/ML Pen Inject 55 Units into the skin daily. 48 mL 0   Insulin Pen Needle (BD PEN NEEDLE NANO 2ND GEN) 32G X 4 MM MISC Use with insulin pen as directed 100 each 3   isosorbide mononitrate (IMDUR) 60 MG 24 hr tablet Take 1 tablet (60 mg total) by mouth daily. Please call to schedule an overdue appointment with Dr. Angelena Form for refills, [phone], thank you. 1st attempt. 30 tablet 0   Lancets (ONETOUCH ULTRASOFT) lancets Use as instructed 100 each 12   Metoprolol Tartrate 37.5 MG TABS Take 1 tablet by mouth 2 (two) times daily.     rosuvastatin (CRESTOR) 20 MG tablet TAKE 1 TABLET BY MOUTH AT BEDTIME. SCHEDULE PHYSICAL EXAM 90 tablet 1   tamsulosin (FLOMAX) 0.4 MG CAPS capsule TAKE 1 CAPSULE BY MOUTH EVERY  DAY (Patient taking differently: Take 0.4 mg by mouth daily.) 90 capsule 1   torsemide (DEMADEX) 20 MG tablet Take 20 mg by mouth daily.     No current facility-administered medications for this visit.    No Known Allergies  Social History   Socioeconomic History   Marital status: Divorced    Spouse name: Not on file   Number of children: 2   Years of education: Not on file   Highest education level: Not on file  Occupational History   Occupation: retired    Fish farm manager: [employer]  Tobacco Use   Smoking status: Former    Types: Cigarettes    Quit date: 08/13/1994    Years since quitting: 28.2   Smokeless tobacco: Never  Vaping Use   Vaping Use: Never used  Substance and Sexual Activity   Alcohol use: No    Alcohol/week: 0.0 standard drinks of alcohol   Drug use: No   Sexual activity: Not Currently    Birth control/protection: None  Other Topics Concern   Not on file   Social History Narrative   Not on file   Social Determinants of Health   Financial Resource Strain: Not on file  Food Insecurity: No Food Insecurity (06/20/2022)   Hunger Vital Sign    Worried About Running Out of Food in the Last Year: Never true    [place] in the Last Year: Never true  Transportation Needs: No Transportation Needs (06/20/2022)   PRAPARE - Hydrologist (Medical): No    Lack of Transportation (Non-Medical): No  Physical Activity: Not on file  Stress: Not on file  Social Connections: Not on file  Intimate Partner Violence: Not on file    Family History  Problem Relation Age of Onset   Cancer Father        "all over"   Coronary artery disease Brother    Diabetes Brother    Stroke Mother    Diabetes Mother    Cancer Brother    Colon cancer Neg Hx    Esophageal cancer Neg Hx    Rectal cancer Neg Hx    Stomach cancer Neg Hx     Review of Systems:  As stated in the HPI and otherwise negative.   BP 128/70   Pulse (!) 57   Ht '5\' 7"'$  (1.702 m)   Wt 83.8 kg   SpO2 98%   BMI 28.94 kg/m   Physical Examination: General: Well developed, well nourished, NAD  HEENT: OP clear, mucus membranes moist  SKIN: warm, dry. No rashes. Neuro: No focal deficits  Musculoskeletal: Muscle strength 5/5 all ext  Psychiatric: Mood and affect normal  Neck: No JVD, no carotid bruits, no thyromegaly, no lymphadenopathy.  Lungs:Clear bilaterally, no wheezes, rhonci, crackles Cardiovascular: Regular rate and rhythm. No murmurs, gallops or rubs. Abdomen:Soft. Bowel sounds present. Non-tender.  Extremities: No lower extremity edema. Pulses are 2 + in the bilateral DP/PT.  EKG:  EKG is not ordered today. The ekg ordered today demonstrates   Recent Labs: 09/27/2022: B Natriuretic Peptide 195.8 09/28/2022: Magnesium 3.4 11/09/2022: ALT 11; BUN 60; Creatinine, Ser 2.51; Hemoglobin 12.3; Platelets 183.0; Potassium 4.9; Sodium 140   Lipid Panel     Component Value Date/Time   CHOL 187 04/27/2022 1437   TRIG (H) 04/27/2022 1437    486.0 Triglyceride is over 400; calculations on Lipids are invalid.   HDL 23.70 (L) 04/27/2022 1437   CHOLHDL 8 04/27/2022 1437  VLDL 42.8 (H) 11/12/2018 1528   LDLCALC 98 10/28/2021 1510   LDLDIRECT 98.0 04/27/2022 1437     Wt Readings from Last 3 Encounters:  11/20/22 83.8 kg  11/09/22 84.4 kg  10/03/22 85.3 kg    Assessment and Plan:   1. CAD s/p CABG without angina: No chest pain suggestive of angina. Normal LV function by echo in 2022. Will continue ASA, beta blocker, Imdur and statin.   2. HTN: BP is controlled. Continue current therapy  3. Paroxysmal atrial fibrillation: Followed in our EP clinic. Will continue beta blocker and Eliquis.   4. Chronic diastolic CHF: Weight is stable. No LE edema. Continue torsemide 20 mg daily.   5. CKD, stage 4: Stable. Followed by Nephrology.   Labs/ tests ordered today include:  No orders of the defined types were placed in this encounter.  Disposition:   F/U with me in 6 months  Signed, Lauree Chandler, MD 11/20/2022 4:11 PM    [facility] [address], [place], [place]  [address] Phone: [phone]; Fax: [phone]

## 2022-11-20 NOTE — Patient Instructions (Signed)
Medication Instructions:  No changes *If you need a refill on your cardiac medications before your next appointment, please call your pharmacy*   Lab Work: none   Testing/Procedures: none   Follow-Up: At [facility], you and your health needs are our priority.  As part of our continuing mission to provide you with exceptional heart care, we have created designated Provider Care Teams.  These Care Teams include your primary Cardiologist (physician) and Advanced Practice Providers (APPs -  Physician Assistants and Nurse Practitioners) who all work together to provide you with the care you need, when you need it.   Your next appointment:   12 month(s)  Provider:   Christopher McAlhany, MD      

## 2022-11-22 ENCOUNTER — Other Ambulatory Visit (HOSPITAL_COMMUNITY): Payer: Self-pay

## 2022-11-22 NOTE — Telephone Encounter (Signed)
Patient Advocate Encounter  Tier exception submitted on 11/22/2022 Key [id number] Status is pending

## 2022-11-26 ENCOUNTER — Other Ambulatory Visit: Payer: Self-pay | Admitting: Cardiovascular Disease

## 2022-11-26 DIAGNOSIS — I480 Paroxysmal atrial fibrillation: Secondary | ICD-10-CM

## 2022-11-27 ENCOUNTER — Telehealth: Payer: Self-pay | Admitting: Nurse Practitioner

## 2022-11-27 NOTE — Telephone Encounter (Signed)
Called spoke to [name]. Verbal orders have been given. Will call if any questions.

## 2022-11-27 NOTE — Telephone Encounter (Signed)
Verbal orders ok

## 2022-11-27 NOTE — Telephone Encounter (Signed)
Home Health verbal orders [facility] Name: Jake Gross number: [phone]  Requesting OT/PT/Skilled nursing/Social Work/Speech:Nursing  Reason: Diabetes,muscle weakness,low blood sugar ,heart failure,medication management  Frequency: 1wk4   Please forward to [facility] pool or providers CMA

## 2022-11-29 NOTE — Telephone Encounter (Signed)
Prescription refill request for Eliquis received. Indication: a fib Last office visit: 11/20/22 Scr: 2.51 (11/09/22) Age: 82 Weight: 83kg  Proving one month refill since patient will need dose decrease next month

## 2022-11-30 DIAGNOSIS — I5032 Chronic diastolic (congestive) heart failure: Secondary | ICD-10-CM | POA: Diagnosis not present

## 2022-11-30 DIAGNOSIS — I480 Paroxysmal atrial fibrillation: Secondary | ICD-10-CM | POA: Diagnosis not present

## 2022-11-30 DIAGNOSIS — I2510 Atherosclerotic heart disease of native coronary artery without angina pectoris: Secondary | ICD-10-CM | POA: Diagnosis not present

## 2022-11-30 DIAGNOSIS — I130 Hypertensive heart and chronic kidney disease with heart failure and stage 1 through stage 4 chronic kidney disease, or unspecified chronic kidney disease: Secondary | ICD-10-CM | POA: Diagnosis not present

## 2022-11-30 DIAGNOSIS — E1122 Type 2 diabetes mellitus with diabetic chronic kidney disease: Secondary | ICD-10-CM | POA: Diagnosis not present

## 2022-11-30 DIAGNOSIS — N184 Chronic kidney disease, stage 4 (severe): Secondary | ICD-10-CM | POA: Diagnosis not present

## 2022-12-02 DIAGNOSIS — I480 Paroxysmal atrial fibrillation: Secondary | ICD-10-CM | POA: Diagnosis not present

## 2022-12-02 DIAGNOSIS — I2510 Atherosclerotic heart disease of native coronary artery without angina pectoris: Secondary | ICD-10-CM | POA: Diagnosis not present

## 2022-12-02 DIAGNOSIS — Z8673 Personal history of transient ischemic attack (TIA), and cerebral infarction without residual deficits: Secondary | ICD-10-CM | POA: Diagnosis not present

## 2022-12-02 DIAGNOSIS — N184 Chronic kidney disease, stage 4 (severe): Secondary | ICD-10-CM | POA: Diagnosis not present

## 2022-12-02 DIAGNOSIS — M6281 Muscle weakness (generalized): Secondary | ICD-10-CM | POA: Diagnosis not present

## 2022-12-02 DIAGNOSIS — Z951 Presence of aortocoronary bypass graft: Secondary | ICD-10-CM | POA: Diagnosis not present

## 2022-12-02 DIAGNOSIS — M47892 Other spondylosis, cervical region: Secondary | ICD-10-CM | POA: Diagnosis not present

## 2022-12-02 DIAGNOSIS — Z794 Long term (current) use of insulin: Secondary | ICD-10-CM | POA: Diagnosis not present

## 2022-12-02 DIAGNOSIS — Z9181 History of falling: Secondary | ICD-10-CM | POA: Diagnosis not present

## 2022-12-02 DIAGNOSIS — N189 Chronic kidney disease, unspecified: Secondary | ICD-10-CM | POA: Diagnosis not present

## 2022-12-02 DIAGNOSIS — D500 Iron deficiency anemia secondary to blood loss (chronic): Secondary | ICD-10-CM | POA: Diagnosis not present

## 2022-12-02 DIAGNOSIS — E1122 Type 2 diabetes mellitus with diabetic chronic kidney disease: Secondary | ICD-10-CM | POA: Diagnosis not present

## 2022-12-02 DIAGNOSIS — D472 Monoclonal gammopathy: Secondary | ICD-10-CM | POA: Diagnosis not present

## 2022-12-02 DIAGNOSIS — Z87891 Personal history of nicotine dependence: Secondary | ICD-10-CM | POA: Diagnosis not present

## 2022-12-02 DIAGNOSIS — E785 Hyperlipidemia, unspecified: Secondary | ICD-10-CM | POA: Diagnosis not present

## 2022-12-02 DIAGNOSIS — M1990 Unspecified osteoarthritis, unspecified site: Secondary | ICD-10-CM | POA: Diagnosis not present

## 2022-12-02 DIAGNOSIS — I5032 Chronic diastolic (congestive) heart failure: Secondary | ICD-10-CM | POA: Diagnosis not present

## 2022-12-02 DIAGNOSIS — I130 Hypertensive heart and chronic kidney disease with heart failure and stage 1 through stage 4 chronic kidney disease, or unspecified chronic kidney disease: Secondary | ICD-10-CM | POA: Diagnosis not present

## 2022-12-02 DIAGNOSIS — E559 Vitamin D deficiency, unspecified: Secondary | ICD-10-CM | POA: Diagnosis not present

## 2022-12-02 DIAGNOSIS — Z7901 Long term (current) use of anticoagulants: Secondary | ICD-10-CM | POA: Diagnosis not present

## 2022-12-04 ENCOUNTER — Ambulatory Visit: Payer: Self-pay

## 2022-12-04 NOTE — Patient Outreach (Signed)
  Care Coordination   Follow Up Visit Note   12/04/2022 Name: Jake Gross MRN: [id number] DOB: 02-05-1941  Jake Gross is a 82 y.o. year old male who sees Cable, Alyson Locket, NP for primary care. I spoke with  [employer] by phone today.  What matters to the patients health and wellness today?  Patient states he feels like his strength is coming back. He states he continues having therapy with PT 1 x per week.   Patient reports today's blood pressure was 148/56.  He states his blood pressures range from 130-140's/ 50-60's.  He denies any increase in swelling or shortness of breath. Patient reports today's weight was 184 lbs. Per chart review next follow up visit with pcp is 02/09/23.     Goals Addressed             This Visit's Progress    Patient stated: decrease blood pressure and post hospital/SNF discharge follow up       Interventions Today    Flowsheet Row Most Recent Value  Chronic Disease   Chronic disease during today's visit Hypertension (HTN), Chronic Kidney Disease/End Stage Renal Disease (ESRD)  General Interventions   General Interventions Discussed/Reviewed General Interventions Reviewed, Doctor Visits  [Assessed blood pressure readings.  Advised to notify provider for readings outside of normal parameters. Normal parameters discussed.  Verbally assessed patients current weight and signs/ symptoms of swelling. Advised to continue monitoring BP / weight.]  Doctor Visits Discussed/Reviewed Doctor Visits Reviewed  [Discussed upcoming/ scheduled appointments.]  Exercise Interventions   Exercise Discussed/Reviewed Physical Activity  Physical Activity Discussed/Reviewed Home Exercise Program (HEP)  Kathlynn Grate if patient still having ongoing PT with [facility].  Encouraged to do exercised provided by PT on non therapy days.]  Nutrition Interventions   Nutrition Discussed/Reviewed Decreasing salt  [Patient advised to adhere to low salt diet.]  Pharmacy  Interventions   Pharmacy Dicussed/Reviewed Pharmacy Topics Reviewed  [Reviewed medications and discussed importance of compliance.]     Discussed plans for ongoing care coordination follow up with RNCM.  Patient verbally agreed to next telephone outreach with [facility] on 02/01/23.               SDOH assessments and interventions completed:  No     Care Coordination Interventions:  Yes, provided   Follow up plan: Follow up call scheduled for 02/01/23    Encounter Outcome:  Pt. Visit Completed  Quinn Plowman RN,BSN,CCM [place] [phone] direct line

## 2022-12-05 ENCOUNTER — Ambulatory Visit (INDEPENDENT_AMBULATORY_CARE_PROVIDER_SITE_OTHER): Payer: Medicare Other

## 2022-12-05 VITALS — Ht 67.0 in | Wt 184.0 lb

## 2022-12-05 DIAGNOSIS — Z0000 Encounter for general adult medical examination without abnormal findings: Secondary | ICD-10-CM | POA: Diagnosis not present

## 2022-12-05 NOTE — Progress Notes (Signed)
I connected with  [place] on 12/05/22 by a audio enabled telemedicine application and verified that I am speaking with the correct person using two identifiers.  Patient Location: Home  Provider Location: Office/Clinic  I discussed the limitations of evaluation and management by telemedicine. The patient expressed understanding and agreed to proceed.  Subjective:   Jake Gross is a 82 y.o. male who presents for Medicare Annual/Subsequent preventive examination.  Review of Systems    Cardiac Risk Factors include: advanced age (>35mn, >>41women);diabetes mellitus;dyslipidemia;hypertension;male gender     Objective:    Today's Vitals   12/05/22 1420  Weight: 184 lb (83.5 kg)  Height: 5' 7"$  (1.702 m)   Body mass index is 28.82 kg/m.     12/05/2022    2:33 PM 09/27/2022   10:17 PM 05/09/2022    9:17 AM 10/31/2021    9:48 AM 09/12/2021    1:22 PM 09/22/2020    3:39 PM 08/27/2019    9:00 PM  Advanced Directives  Does Patient Have a Medical Advance Directive? No No No No No No No  Would patient like information on creating a medical advance directive? Yes (ED - Information included in AVS)  Yes (MAU/Ambulatory/Procedural Areas - Information given) Yes (MAU/Ambulatory/Procedural Areas - Information given)  Yes (MAU/Ambulatory/Procedural Areas - Information given) No - Patient declined    Current Medications (verified) Outpatient Encounter Medications as of 12/05/2022  Medication Sig   amLODipine (NORVASC) 5 MG tablet Take 1 tablet (5 mg total) by mouth daily. Please call to schedule an overdue appointment with Dr. MAngelena Formfor refills, [phone] thank you. 2ND ATTEMPT   apixaban (ELIQUIS) 5 MG TABS tablet TAKE 1 TABLET BY MOUTH TWICE A DAY   chlorthalidone (HYGROTON) 25 MG tablet Take 25 mg by mouth daily.   hydrALAZINE (APRESOLINE) 100 MG tablet Take 1 tablet (100 mg total) by mouth 3 (three) times daily. Please call to schedule an overdue appointment with Dr.  MAngelena Formfor refills, [phone] thank you. 1st attempt.   insulin glargine-yfgn (SEMGLEE) 100 UNIT/ML Pen Inject 55 Units into the skin daily.   Insulin Pen Needle (BD PEN NEEDLE NANO 2ND GEN) 32G X 4 MM MISC Use with insulin pen as directed   isosorbide mononitrate (IMDUR) 60 MG 24 hr tablet TAKE 1 TABLET BY MOUTH EVERY DAY (NEED OFFICE VISIT)   Lancets (ONETOUCH ULTRASOFT) lancets Use as instructed   Metoprolol Tartrate 37.5 MG TABS Take 1 tablet by mouth 2 (two) times daily.   rosuvastatin (CRESTOR) 20 MG tablet TAKE 1 TABLET BY MOUTH AT BEDTIME. SCHEDULE PHYSICAL EXAM   tamsulosin (FLOMAX) 0.4 MG CAPS capsule TAKE 1 CAPSULE BY MOUTH EVERY DAY (Patient taking differently: Take 0.4 mg by mouth daily.)   torsemide (DEMADEX) 20 MG tablet Take 20 mg by mouth daily.   No facility-administered encounter medications on file as of 12/05/2022.    Allergies (verified) Patient has no known allergies.   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   CAD (coronary artery disease) 09/29/2019   Cervical spondylosis    Chronic anticoagulation    Chronic diastolic CHF (congestive heart failure) ([place] 12/17/2018   CKD (chronic kidney disease) 12/17/2018   Diabetes mellitus    History of GI bleed  09/29/2019   Hyperlipidemia    Hypertension    Iron deficiency anemia due to chronic blood loss    Paroxysmal atrial fibrillation ([place]) 12/17/2018   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 NO ISCHEMIA   COLONOSCOPY W/ POLYPECTOMY     5 polyps   COLONOSCOPY  WITH PROPOFOL N/A 08/29/2019   Procedure: COLONOSCOPY WITH PROPOFOL;  Surgeon: Doran Stabler, MD;  Location: [facility];  Service: Gastroenterology;  Laterality: N/A;   CORONARY ARTERY BYPASS GRAFT N/A 11/26/2018   Procedure: CORONARY ARTERY BYPASS GRAFTING (CABG), ON PUMP, TIMES FOUR, USING LEFT INTERNAL MAMMARY ARTERY AND ENDOSCOPICALLY HARVESTED LEFT SAPHENOUS VEIN;  Surgeon: Gaye Pollack, MD;  Location: [facility];  Service: Open Heart Surgery;  Laterality: N/A;   ESOPHAGOGASTRODUODENOSCOPY (EGD) WITH PROPOFOL N/A 08/29/2019   Procedure: ESOPHAGOGASTRODUODENOSCOPY (EGD) WITH PROPOFOL;  Surgeon: Doran Stabler, MD;  Location: [facility];  Service: Gastroenterology;  Laterality: N/A;   EUS N/A 03/11/2015   Procedure: LOWER ENDOSCOPIC ULTRASOUND (EUS);  Surgeon: Milus Banister, MD;  Location: [facility];  Service: Endoscopy;  Laterality: N/A;   FLEXIBLE SIGMOIDOSCOPY N/A 02/02/2015   Procedure: FLEXIBLE SIGMOIDOSCOPY;  Surgeon: Inda Castle, MD;  Location: [facility];  Service: Endoscopy;  Laterality: N/A;  ERBE   HEMOSTASIS CLIP PLACEMENT  08/29/2019   Procedure: HEMOSTASIS CLIP PLACEMENT;  Surgeon: Doran Stabler, MD;  Location: [facility];  Service: Gastroenterology;;   HOT HEMOSTASIS N/A 08/29/2019   Procedure: HOT HEMOSTASIS (ARGON PLASMA COAGULATION/BICAP);  Surgeon: Doran Stabler, MD;  Location: [facility];  Service: Gastroenterology;  Laterality: N/A;   LEFT HEART CATH AND CORONARY ANGIOGRAPHY N/A 11/20/2018   Procedure: LEFT HEART CATH AND CORONARY ANGIOGRAPHY;  Surgeon: Burnell Blanks, MD;  Location: [facility];  Service: Cardiovascular;  Laterality: N/A;   LOOP RECORDER INSERTION N/A 08/14/2017   Procedure: LOOP RECORDER INSERTION;  Surgeon: Thompson Grayer, MD;  Location: [facility];  Service: Cardiovascular;  Laterality: N/A;   PARTIAL PROCTECTOMY BY TEM N/A 04/08/2015   Procedure: TEM PARTIAL PROCTECTOMY OF RECTAL MASS;  Surgeon:  Michael Boston, MD;  Location: [facility];  Service: General;  Laterality: N/A;   POLYPECTOMY  08/29/2019   Procedure: POLYPECTOMY;  Surgeon: Doran Stabler, MD;  Location: [facility];  Service: Gastroenterology;;   TEE WITHOUT CARDIOVERSION N/A 06/25/2017   Procedure: TRANSESOPHAGEAL ECHOCARDIOGRAM (TEE);  Surgeon: Skeet Latch, MD;  Location: [facility];  Service: Cardiovascular;  Laterality: N/A;   TEE WITHOUT CARDIOVERSION N/A 11/26/2018   Procedure: TRANSESOPHAGEAL ECHOCARDIOGRAM (TEE);  Surgeon: Gaye Pollack, MD;  Location: [facility];  Service: Open Heart Surgery;  Laterality: N/A;   TONSILLECTOMY AND ADENOIDECTOMY     as child   Family History  Problem Relation Age of Onset   Cancer Father        "all over"   Coronary artery disease Brother    Diabetes Brother    Stroke Mother    Diabetes Mother    Cancer Brother    Colon cancer Neg Hx    Esophageal cancer Neg Hx    Rectal cancer Neg Hx    Stomach cancer Neg Hx    Social History   Socioeconomic History   Marital status: Divorced  Spouse name: Not on file   Number of children: 2   Years of education: Not on file   Highest education level: Not on file  Occupational History   Occupation: retired    Fish farm manager: [employer]  Tobacco Use   Smoking status: Former    Types: Cigarettes    Quit date: 08/13/1994    Years since quitting: 28.3   Smokeless tobacco: Never  Vaping Use   Vaping Use: Never used  Substance and Sexual Activity   Alcohol use: No    Alcohol/week: 0.0 standard drinks of alcohol   Drug use: No   Sexual activity: Not Currently    Birth control/protection: None  Other Topics Concern   Not on file  Social History Narrative   Not on file   Social Determinants of Health   Financial Resource Strain: Low Risk  (12/05/2022)   Overall Financial Resource Strain (CARDIA)    Difficulty of Paying Living Expenses: Not hard at all  Food Insecurity: No Food Insecurity (12/05/2022)   Hunger Vital Sign    Worried  About Running Out of Food in the Last Year: Never true    [place] in the Last Year: Never true  Transportation Needs: No Transportation Needs (12/05/2022)   PRAPARE - Hydrologist (Medical): No    Lack of Transportation (Non-Medical): No  Physical Activity: Inactive (12/05/2022)   Exercise Vital Sign    Days of Exercise per Week: 0 days    Minutes of Exercise per Session: 0 min  Stress: No Stress Concern Present (12/05/2022)   [place]    Feeling of Stress : Not at all  Social Connections: Socially Isolated (12/05/2022)   Social Connection and Isolation Panel [NHANES]    Frequency of Communication with Friends and Family: More than three times a week    Frequency of Social Gatherings with Friends and Family: More than three times a week    Attends Religious Services: Never    Marine scientist or Organizations: No    Attends Music therapist: Never    Marital Status: Divorced    Tobacco Counseling Counseling given: Not Answered   Clinical Intake:  Pre-visit preparation completed: Yes  Pain : No/denies pain     BMI - recorded: 28.82 Nutritional Status: BMI 25 -29 Overweight Nutritional Risks: None Diabetes: Yes CBG done?: No (BS 100 this am by pt at home) Did pt. bring in CBG monitor from home?: No  How often do you need to have someone help you when you read instructions, pamphlets, or other written materials from your doctor or pharmacy?: 1 - Never  Diabetic?yes  Interpreter Needed?: No  Information entered by :: B.Corbitt Cloke,LPN   Activities of Daily Living    12/05/2022    2:33 PM  In your present state of health, do you have any difficulty performing the following activities:  Hearing? 0  Vision? 0  Difficulty concentrating or making decisions? 0  Walking or climbing stairs? 0  Dressing or bathing? 0  Doing errands, shopping? 0  Comment  son and daughter help  Preparing Food and eating ? N  Using the Toilet? N  In the past six months, have you accidently leaked urine? N  Do you have problems with loss of bowel control? N  Managing your Medications? N  Managing your Finances? N  Housekeeping or managing your Housekeeping? N    Patient Care Team:  Michela Pitcher, NP as PCP - General (Pain Medicine) Thompson Grayer, MD (Inactive) as PCP - Electrophysiology (Cardiology) Burnell Blanks, MD as PCP - Cardiology (Cardiology) Inda Castle, MD (Inactive) as Consulting Physician (Gastroenterology) Michael Boston, MD as Consulting Physician (General Surgery) Gavin Pound, MD as Consulting Physician (Rheumatology) Dannielle Karvonen, RN as [place] any recent [place] you may have received from other than [facility] providers in the past year (date may be approximate).     Assessment:   This is a routine wellness examination for Jake Gross.  Hearing/Vision screen Hearing Screening - Comments:: Adequate vision-have not established since his retired (2-47yr ago). Dr GKaty Fitch Dietary issues and exercise activities discussed: Current Exercise Habits: The patient does not participate in regular exercise at present, Exercise limited by: orthopedic condition(s)   Goals Addressed   None    Depression Screen    12/05/2022    2:26 PM 10/28/2021    2:04 PM 09/22/2020    3:39 PM 11/19/2018    2:55 PM 07/16/2018    3:03 PM 12/19/2017    4:40 PM 12/05/2017    3:18 PM  PHQ 2/9 Scores  PHQ - 2 Score 0 0 0 0 0 0 0    Fall Risk    12/05/2022    2:24 PM 01/18/2022    3:30 PM 11/14/2021    4:01 PM 10/28/2021    2:03 PM 06/27/2021    9:48 AM  Fall Risk   Falls in the past year? 1 0 0 0 1  Number falls in past yr: 0 0 0 0 0  Injury with Fall? 1 0 0 0 0  Risk for fall due to : No Fall Risks;Orthopedic patient      Follow up Education provided;Falls prevention discussed        FALL RISK  PREVENTION PERTAINING TO THE HOME:  Any stairs in or around the home? Yes  If so, are there any without handrails? Yes  Home free of loose throw rugs in walkways, pet beds, electrical cords, etc? Yes  Adequate lighting in your home to reduce risk of falls? Yes   ASSISTIVE DEVICES UTILIZED TO PREVENT FALLS:  Life alert? No  Use of a cane, walker or w/c? Yes walker does not use Grab bars in the bathroom? No  Shower chair or bench in shower? No  Elevated toilet seat or a handicapped toilet? Yes   Cognitive Function:        12/05/2022    2:35 PM  6CIT Screen  What Year? 0 points  What month? 0 points  What time? 0 points  Count back from 20 0 points  Months in reverse 0 points  Repeat phrase 0 points  Total Score 0 points    Immunizations Immunization History  Administered Date(s) Administered   Fluad Quad(high Dose 65+) 07/24/2019, 06/27/2021   Influenza,inj,Quad PF,6+ Mos 11/07/2016, 08/03/2017   PFIZER(Purple Top)SARS-COV-2 Vaccination 11/01/2019, 11/22/2019, 10/01/2020   PNEUMOCOCCAL CONJUGATE-20 10/28/2021   Tdap 10/20/2015    TDAP status: Up to date  Flu Vaccine status: Up to date  Pneumococcal vaccine status: Up to date  Covid-19 vaccine status: Completed vaccines  Qualifies for Shingles Vaccine? Yes   Zostavax completed No   Shingrix Completed?: No.    Education has been provided regarding the importance of this vaccine. Patient has been advised to call insurance company to determine out of pocket expense if they have not yet received this vaccine. Advised may  also receive vaccine at local pharmacy or Health Dept. Verbalized acceptance and understanding.  Screening Tests Health Maintenance  Topic Date Due   Diabetic kidney evaluation - Urine ACR  Never done   Hepatitis C Screening  Never done   Zoster Vaccines- Shingrix (1 of 2) Never done   OPHTHALMOLOGY EXAM  01/14/2018   COVID-19 Vaccine (4 - 2023-24 season) 06/16/2022   HEMOGLOBIN A1C  05/10/2023    Diabetic kidney evaluation - eGFR measurement  11/10/2023   FOOT EXAM  11/10/2023   Medicare Annual Wellness (AWV)  12/06/2023   COLONOSCOPY (Pts 45-66yr Insurance coverage will need to be confirmed)  08/28/2024   DTaP/Tdap/Td (2 - Td or Tdap) 10/19/2025   Pneumonia Vaccine 80 Years old  Completed   HPV VACCINES  Aged Out    Health Maintenance  Health Maintenance Due  Topic Date Due   Diabetic kidney evaluation - Urine ACR  Never done   Hepatitis C Screening  Never done   Zoster Vaccines- Shingrix (1 of 2) Never done   OPHTHALMOLOGY EXAM  01/14/2018   COVID-19 Vaccine (4 - 2023-24 season) 06/16/2022    Colorectal cancer screening: Type of screening: Colonoscopy. Completed yes. Repeat every 5 years  Lung Cancer Screening: (Low Dose CT Chest recommended if Age 80-80years, 30 pack-year currently smoking OR have quit w/in 15years.) does not qualify.   Lung Cancer Screening Referral: no  Additional Screening:  Hepatitis C Screening: does not qualify; Completed yes in past complete  Vision Screening: Recommended annual ophthalmology exams for early detection of glaucoma and other disorders of the eye. Is the patient up to date with their annual eye exam?  No  Who is the provider or what is the name of the office in which the patient attends annual eye exams? Dr GKaty Fitch(has retired)the patient has not selected eye provider yet If pt is not established with a provider, would they like to be referred to a provider to establish care? No .   Dental Screening: Recommended annual dental exams for proper oral hygiene  Community Resource Referral / Chronic Care Management: CRR required this visit?  No   CCM required this visit?  No      Plan:     I have personally reviewed and noted the following in the patient's chart:   Medical and social history Use of alcohol, tobacco or illicit drugs  Current medications and supplements including opioid prescriptions. Patient is not  currently taking opioid prescriptions. Functional ability and status Nutritional status Physical activity Advanced directives List of other physicians Hospitalizations, surgeries, and ER visits in previous 12 months Vitals Screenings to include cognitive, depression, and falls Referrals and appointments  In addition, I have reviewed and discussed with patient certain preventive protocols, quality metrics, and best practice recommendations. A written personalized care plan for preventive services as well as general preventive health recommendations were provided to patient.     [facility] LPN   [id number]  Nurse Notes: pt relays he is "getting along" as he [facility]. He relays [facility] 3 times in one [facility] Pt does indicate that his son and daughter come and help/check on him. Pt has no questions or concerns at this time. I encouraged him to call [place] any problems or questions (no matter how small).

## 2022-12-05 NOTE — Patient Instructions (Signed)
Jake Gross , Thank you for taking time to come for your Medicare Wellness Visit. I appreciate your ongoing commitment to your health goals. Please review the following plan we discussed and let me know if I can assist you in the future.   These are the goals we discussed:  Goals      Patient stated: decrease blood pressure and post hospital/SNF discharge follow up     Interventions Today    Flowsheet Row Most Recent Value  Chronic Disease   Chronic disease during today's visit Hypertension (HTN), Chronic Kidney Disease/End Stage Renal Disease (ESRD)  General Interventions   General Interventions Discussed/Reviewed General Interventions Reviewed, Doctor Visits  [Assessed blood pressure readings.  Advised to notify provider for readings outside of normal parameters. Normal parameters discussed.  Verbally assessed patients current weight and signs/ symptoms of swelling. Advised to continue monitoring BP / weight.]  Doctor Visits Discussed/Reviewed Doctor Visits Reviewed  [Discussed upcoming/ scheduled appointments.]  Exercise Interventions   Exercise Discussed/Reviewed Physical Activity  Physical Activity Discussed/Reviewed Home Exercise Program (HEP)  Kathlynn Grate if patient still having ongoing PT with [facility].  Encouraged to do exercised provided by PT on non therapy days.]  Nutrition Interventions   Nutrition Discussed/Reviewed Decreasing salt  [Patient advised to adhere to low salt diet.]  Pharmacy Interventions   Pharmacy Dicussed/Reviewed Pharmacy Topics Reviewed  [Reviewed medications and discussed importance of compliance.]     Discussed plans for ongoing care coordination follow up with RNCM.  Patient verbally agreed to next telephone outreach with [facility] on 02/01/23.               This is a list of the screening recommended for you and due dates:  Health Maintenance  Topic Date Due   Yearly kidney health urinalysis for diabetes  Never done   Hepatitis C  Screening: USPSTF Recommendation to screen - Ages 51-79 yo.  Never done   Zoster (Shingles) Vaccine (1 of 2) Never done   Eye exam for diabetics  01/14/2018   COVID-19 Vaccine (4 - 2023-24 season) 06/16/2022   Hemoglobin A1C  05/10/2023   Yearly kidney function blood test for diabetes  11/10/2023   Complete foot exam   11/10/2023   Medicare Annual Wellness Visit  12/06/2023   Colon Cancer Screening  08/28/2024   DTaP/Tdap/Td vaccine (2 - Td or Tdap) 10/19/2025   Pneumonia Vaccine  Completed   HPV Vaccine  Aged Out    Advanced directives: no mailed to pt  Conditions/risks identified: low falls risk  Next appointment: Follow up in one year for your annual wellness visit. 12/11/2023@1$ :45pm telephone  Preventive Care 11 Years and Older, Male  Preventive care refers to lifestyle choices and visits with your health care provider that can promote health and wellness. What does preventive care include? A yearly physical exam. This is also called an annual well check. Dental exams once or twice a year. Routine eye exams. Ask your health care provider how often you should have your eyes checked. Personal lifestyle choices, including: Daily care of your teeth and gums. Regular physical activity. Eating a healthy diet. Avoiding tobacco and drug use. Limiting alcohol use. Practicing safe sex. Taking low doses of aspirin every day. Taking vitamin and mineral supplements as recommended by your health care provider. What happens during an annual well check? The services and screenings done by your health care provider during your annual well check will depend on your age, overall health, lifestyle risk factors, and family history  of disease. Counseling  Your health care provider may ask you questions about your: Alcohol use. Tobacco use. Drug use. Emotional well-being. Home and relationship well-being. Sexual activity. Eating habits. History of falls. Memory and ability to understand  (cognition). Work and work Statistician. Screening  You may have the following tests or measurements: Height, weight, and BMI. Blood pressure. Lipid and cholesterol levels. These may be checked every 5 years, or more frequently if you are over 23 years old. Skin check. Lung cancer screening. You may have this screening every year starting at age 78 if you have a 30-pack-year history of smoking and currently smoke or have quit within the past 15 years. Fecal occult blood test (FOBT) of the stool. You may have this test every year starting at age 76. Flexible sigmoidoscopy or colonoscopy. You may have a sigmoidoscopy every 5 years or a colonoscopy every 10 years starting at age 2. Prostate cancer screening. Recommendations will vary depending on your family history and other risks. Hepatitis C blood test. Hepatitis B blood test. Sexually transmitted disease (STD) testing. Diabetes screening. This is done by checking your blood sugar (glucose) after you have not eaten for a while (fasting). You may have this done every 1-3 years. Abdominal aortic aneurysm (AAA) screening. You may need this if you are a current or former smoker. Osteoporosis. You may be screened starting at age 46 if you are at high risk. Talk with your health care provider about your test results, treatment options, and if necessary, the need for more tests. Vaccines  Your health care provider may recommend certain vaccines, such as: Influenza vaccine. This is recommended every year. Tetanus, diphtheria, and acellular pertussis (Tdap, Td) vaccine. You may need a Td booster every 10 years. Zoster vaccine. You may need this after age 72. Pneumococcal 13-valent conjugate (PCV13) vaccine. One dose is recommended after age 23. Pneumococcal polysaccharide (PPSV23) vaccine. One dose is recommended after age 81. Talk to your health care provider about which screenings and vaccines you need and how often you need them. This  information is not intended to replace advice given to you by your health care provider. Make sure you discuss any questions you have with your health care provider. Document Released: 10/29/2015 Document Revised: 06/21/2016 Document Reviewed: 08/03/2015 Elsevier Interactive Patient Education  2017 [place] Prevention in the Home Falls can cause injuries. They can happen to people of all ages. There are many things you can do to make your home safe and to help prevent falls. What can I do on the outside of my home? Regularly fix the edges of walkways and driveways and fix any cracks. Remove anything that might make you trip as you walk through a door, such as a raised step or threshold. Trim any bushes or trees on the path to your home. Use bright outdoor lighting. Clear any walking paths of anything that might make someone trip, such as rocks or tools. Regularly check to see if handrails are loose or broken. Make sure that both sides of any steps have handrails. Any raised decks and porches should have guardrails on the edges. Have any leaves, snow, or ice cleared regularly. Use sand or salt on walking paths during winter. Clean up any spills in your garage right away. This includes oil or grease spills. What can I do in the bathroom? Use night lights. Install grab bars by the toilet and in the tub and shower. Do not use towel bars as grab bars. Use  non-skid mats or decals in the tub or shower. If you need to sit down in the shower, use a plastic, non-slip stool. Keep the floor dry. Clean up any water that spills on the floor as soon as it happens. Remove soap buildup in the tub or shower regularly. Attach bath mats securely with double-sided non-slip rug tape. Do not have throw rugs and other things on the floor that can make you trip. What can I do in the bedroom? Use night lights. Make sure that you have a light by your bed that is easy to reach. Do not use any sheets or  blankets that are too big for your bed. They should not hang down onto the floor. Have a firm chair that has side arms. You can use this for support while you get dressed. Do not have throw rugs and other things on the floor that can make you trip. What can I do in the kitchen? Clean up any spills right away. Avoid walking on wet floors. Keep items that you use a lot in easy-to-reach places. If you need to reach something above you, use a strong step stool that has a grab bar. Keep electrical cords out of the way. Do not use floor polish or wax that makes floors slippery. If you must use wax, use non-skid floor wax. Do not have throw rugs and other things on the floor that can make you trip. What can I do with my stairs? Do not leave any items on the stairs. Make sure that there are handrails on both sides of the stairs and use them. Fix handrails that are broken or loose. Make sure that handrails are as long as the stairways. Check any carpeting to make sure that it is firmly attached to the stairs. Fix any carpet that is loose or worn. Avoid having throw rugs at the top or bottom of the stairs. If you do have throw rugs, attach them to the floor with carpet tape. Make sure that you have a light switch at the top of the stairs and the bottom of the stairs. If you do not have them, ask someone to add them for you. What else can I do to help prevent falls? Wear shoes that: Do not have high heels. Have rubber bottoms. Are comfortable and fit you well. Are closed at the toe. Do not wear sandals. If you use a stepladder: Make sure that it is fully opened. Do not climb a closed stepladder. Make sure that both sides of the stepladder are locked into place. Ask someone to hold it for you, if possible. Clearly mark and make sure that you can see: Any grab bars or handrails. First and last steps. Where the edge of each step is. Use tools that help you move around (mobility aids) if they are  needed. These include: Canes. Walkers. Scooters. Crutches. Turn on the lights when you go into a dark area. Replace any light bulbs as soon as they burn out. Set up your furniture so you have a clear path. Avoid moving your furniture around. If any of your floors are uneven, fix them. If there are any pets around you, be aware of where they are. Review your medicines with your doctor. Some medicines can make you feel dizzy. This can increase your chance of falling. Ask your doctor what other things that you can do to help prevent falls. This information is not intended to replace advice given to you by your health care  provider. Make sure you discuss any questions you have with your health care provider. Document Released: 07/29/2009 Document Revised: 03/09/2016 Document Reviewed: 11/06/2014 Elsevier Interactive Patient Education  2017 [employer].

## 2022-12-06 NOTE — Telephone Encounter (Signed)
Can we verify that the pharmacy and what we have on file is the same insurance plans please

## 2022-12-06 NOTE — Telephone Encounter (Signed)
This fax received in regards to Tier exception form sent.

## 2022-12-07 NOTE — Telephone Encounter (Signed)
Left message to return call to our office.  

## 2022-12-08 NOTE — Telephone Encounter (Signed)
Left message to return call to our office.  

## 2022-12-15 NOTE — Telephone Encounter (Signed)
Called patient back he has been taking with no issues. Was able to pick up self pay.

## 2022-12-15 NOTE — Telephone Encounter (Signed)
Spoke to patient verified her. Patient states that he does not have prescription coverage at all.

## 2022-12-15 NOTE — Telephone Encounter (Signed)
So he was able to get his insulin?

## 2022-12-16 ENCOUNTER — Other Ambulatory Visit: Payer: Self-pay | Admitting: Cardiovascular Disease

## 2022-12-18 DIAGNOSIS — I2510 Atherosclerotic heart disease of native coronary artery without angina pectoris: Secondary | ICD-10-CM | POA: Diagnosis not present

## 2022-12-18 DIAGNOSIS — I5032 Chronic diastolic (congestive) heart failure: Secondary | ICD-10-CM | POA: Diagnosis not present

## 2022-12-18 DIAGNOSIS — N184 Chronic kidney disease, stage 4 (severe): Secondary | ICD-10-CM | POA: Diagnosis not present

## 2022-12-18 DIAGNOSIS — E1122 Type 2 diabetes mellitus with diabetic chronic kidney disease: Secondary | ICD-10-CM | POA: Diagnosis not present

## 2022-12-18 DIAGNOSIS — I130 Hypertensive heart and chronic kidney disease with heart failure and stage 1 through stage 4 chronic kidney disease, or unspecified chronic kidney disease: Secondary | ICD-10-CM | POA: Diagnosis not present

## 2022-12-18 DIAGNOSIS — I480 Paroxysmal atrial fibrillation: Secondary | ICD-10-CM | POA: Diagnosis not present

## 2022-12-26 ENCOUNTER — Other Ambulatory Visit: Payer: Self-pay | Admitting: Cardiovascular Disease

## 2022-12-26 DIAGNOSIS — I480 Paroxysmal atrial fibrillation: Secondary | ICD-10-CM

## 2022-12-27 NOTE — Telephone Encounter (Signed)
Prescription refill request for Eliquis received. Indication: Afib  Last office visit: 11/20/22 Angelena Form)  Scr: 2.51 (11/09/22)  Age: 82 Weight: 83.5kg  Per Fuller [name], PharmD, pt's dose should be decreased to 2.'5mg'$  BID. Called pt and made him aware of dose change. Verbalized understanding. Refill sent.

## 2022-12-29 DIAGNOSIS — N184 Chronic kidney disease, stage 4 (severe): Secondary | ICD-10-CM | POA: Diagnosis not present

## 2022-12-29 DIAGNOSIS — I2510 Atherosclerotic heart disease of native coronary artery without angina pectoris: Secondary | ICD-10-CM | POA: Diagnosis not present

## 2022-12-29 DIAGNOSIS — I480 Paroxysmal atrial fibrillation: Secondary | ICD-10-CM | POA: Diagnosis not present

## 2022-12-29 DIAGNOSIS — I5032 Chronic diastolic (congestive) heart failure: Secondary | ICD-10-CM | POA: Diagnosis not present

## 2022-12-29 DIAGNOSIS — E1122 Type 2 diabetes mellitus with diabetic chronic kidney disease: Secondary | ICD-10-CM | POA: Diagnosis not present

## 2022-12-29 DIAGNOSIS — I130 Hypertensive heart and chronic kidney disease with heart failure and stage 1 through stage 4 chronic kidney disease, or unspecified chronic kidney disease: Secondary | ICD-10-CM | POA: Diagnosis not present

## 2023-01-01 ENCOUNTER — Telehealth: Payer: Self-pay | Admitting: Nurse Practitioner

## 2023-01-01 DIAGNOSIS — I2510 Atherosclerotic heart disease of native coronary artery without angina pectoris: Secondary | ICD-10-CM | POA: Diagnosis not present

## 2023-01-01 DIAGNOSIS — D472 Monoclonal gammopathy: Secondary | ICD-10-CM | POA: Diagnosis not present

## 2023-01-01 DIAGNOSIS — Z87891 Personal history of nicotine dependence: Secondary | ICD-10-CM | POA: Diagnosis not present

## 2023-01-01 DIAGNOSIS — E1122 Type 2 diabetes mellitus with diabetic chronic kidney disease: Secondary | ICD-10-CM | POA: Diagnosis not present

## 2023-01-01 DIAGNOSIS — Z951 Presence of aortocoronary bypass graft: Secondary | ICD-10-CM | POA: Diagnosis not present

## 2023-01-01 DIAGNOSIS — Z794 Long term (current) use of insulin: Secondary | ICD-10-CM | POA: Diagnosis not present

## 2023-01-01 DIAGNOSIS — D500 Iron deficiency anemia secondary to blood loss (chronic): Secondary | ICD-10-CM | POA: Diagnosis not present

## 2023-01-01 DIAGNOSIS — Z8673 Personal history of transient ischemic attack (TIA), and cerebral infarction without residual deficits: Secondary | ICD-10-CM | POA: Diagnosis not present

## 2023-01-01 DIAGNOSIS — M47812 Spondylosis without myelopathy or radiculopathy, cervical region: Secondary | ICD-10-CM | POA: Diagnosis not present

## 2023-01-01 DIAGNOSIS — I5032 Chronic diastolic (congestive) heart failure: Secondary | ICD-10-CM | POA: Diagnosis not present

## 2023-01-01 DIAGNOSIS — Z9181 History of falling: Secondary | ICD-10-CM | POA: Diagnosis not present

## 2023-01-01 DIAGNOSIS — Z7901 Long term (current) use of anticoagulants: Secondary | ICD-10-CM | POA: Diagnosis not present

## 2023-01-01 DIAGNOSIS — N184 Chronic kidney disease, stage 4 (severe): Secondary | ICD-10-CM | POA: Diagnosis not present

## 2023-01-01 DIAGNOSIS — I480 Paroxysmal atrial fibrillation: Secondary | ICD-10-CM | POA: Diagnosis not present

## 2023-01-01 DIAGNOSIS — E559 Vitamin D deficiency, unspecified: Secondary | ICD-10-CM | POA: Diagnosis not present

## 2023-01-01 DIAGNOSIS — I130 Hypertensive heart and chronic kidney disease with heart failure and stage 1 through stage 4 chronic kidney disease, or unspecified chronic kidney disease: Secondary | ICD-10-CM | POA: Diagnosis not present

## 2023-01-01 DIAGNOSIS — E785 Hyperlipidemia, unspecified: Secondary | ICD-10-CM | POA: Diagnosis not present

## 2023-01-01 NOTE — Telephone Encounter (Signed)
Home Health verbal orders [place] Name:[facility]  Callback number: [id number] OT/PT/Skilled nursing/Social Work/Speech:  Reason:PT  Frequency: 1 W  - 9 W  Please forward to [facility] pool or providers CMA

## 2023-01-01 NOTE — Telephone Encounter (Signed)
Verbals orders given.  

## 2023-01-01 NOTE — Telephone Encounter (Signed)
Verbal orders ok

## 2023-01-05 ENCOUNTER — Other Ambulatory Visit: Payer: Self-pay | Admitting: Cardiovascular Disease

## 2023-01-05 DIAGNOSIS — I2510 Atherosclerotic heart disease of native coronary artery without angina pectoris: Secondary | ICD-10-CM | POA: Diagnosis not present

## 2023-01-05 DIAGNOSIS — I130 Hypertensive heart and chronic kidney disease with heart failure and stage 1 through stage 4 chronic kidney disease, or unspecified chronic kidney disease: Secondary | ICD-10-CM | POA: Diagnosis not present

## 2023-01-05 DIAGNOSIS — N184 Chronic kidney disease, stage 4 (severe): Secondary | ICD-10-CM | POA: Diagnosis not present

## 2023-01-05 DIAGNOSIS — I5032 Chronic diastolic (congestive) heart failure: Secondary | ICD-10-CM | POA: Diagnosis not present

## 2023-01-05 DIAGNOSIS — I480 Paroxysmal atrial fibrillation: Secondary | ICD-10-CM | POA: Diagnosis not present

## 2023-01-05 DIAGNOSIS — E1122 Type 2 diabetes mellitus with diabetic chronic kidney disease: Secondary | ICD-10-CM | POA: Diagnosis not present

## 2023-01-10 DIAGNOSIS — I2510 Atherosclerotic heart disease of native coronary artery without angina pectoris: Secondary | ICD-10-CM | POA: Diagnosis not present

## 2023-01-10 DIAGNOSIS — I480 Paroxysmal atrial fibrillation: Secondary | ICD-10-CM | POA: Diagnosis not present

## 2023-01-10 DIAGNOSIS — N184 Chronic kidney disease, stage 4 (severe): Secondary | ICD-10-CM | POA: Diagnosis not present

## 2023-01-10 DIAGNOSIS — I130 Hypertensive heart and chronic kidney disease with heart failure and stage 1 through stage 4 chronic kidney disease, or unspecified chronic kidney disease: Secondary | ICD-10-CM | POA: Diagnosis not present

## 2023-01-10 DIAGNOSIS — E1122 Type 2 diabetes mellitus with diabetic chronic kidney disease: Secondary | ICD-10-CM | POA: Diagnosis not present

## 2023-01-10 DIAGNOSIS — I5032 Chronic diastolic (congestive) heart failure: Secondary | ICD-10-CM | POA: Diagnosis not present

## 2023-01-12 ENCOUNTER — Other Ambulatory Visit (HOSPITAL_COMMUNITY): Payer: Self-pay

## 2023-01-12 ENCOUNTER — Other Ambulatory Visit: Payer: Self-pay | Admitting: Cardiovascular Disease

## 2023-01-16 DIAGNOSIS — E1122 Type 2 diabetes mellitus with diabetic chronic kidney disease: Secondary | ICD-10-CM | POA: Diagnosis not present

## 2023-01-16 DIAGNOSIS — N184 Chronic kidney disease, stage 4 (severe): Secondary | ICD-10-CM | POA: Diagnosis not present

## 2023-01-16 DIAGNOSIS — I2510 Atherosclerotic heart disease of native coronary artery without angina pectoris: Secondary | ICD-10-CM | POA: Diagnosis not present

## 2023-01-16 DIAGNOSIS — I480 Paroxysmal atrial fibrillation: Secondary | ICD-10-CM | POA: Diagnosis not present

## 2023-01-16 DIAGNOSIS — I130 Hypertensive heart and chronic kidney disease with heart failure and stage 1 through stage 4 chronic kidney disease, or unspecified chronic kidney disease: Secondary | ICD-10-CM | POA: Diagnosis not present

## 2023-01-16 DIAGNOSIS — I5032 Chronic diastolic (congestive) heart failure: Secondary | ICD-10-CM | POA: Diagnosis not present

## 2023-01-24 ENCOUNTER — Emergency Department (HOSPITAL_COMMUNITY): Payer: Medicare Other

## 2023-01-24 ENCOUNTER — Other Ambulatory Visit: Payer: Self-pay

## 2023-01-24 ENCOUNTER — Inpatient Hospital Stay (HOSPITAL_COMMUNITY)
Admission: EM | Admit: 2023-01-24 | Discharge: 2023-01-26 | DRG: 683 | Disposition: A | Discharge status: Home Health | Payer: Medicare Other | Attending: Family Medicine | Admitting: Family Medicine

## 2023-01-24 DIAGNOSIS — Z833 Family history of diabetes mellitus: Secondary | ICD-10-CM

## 2023-01-24 DIAGNOSIS — R519 Headache, unspecified: Secondary | ICD-10-CM | POA: Diagnosis not present

## 2023-01-24 DIAGNOSIS — I480 Paroxysmal atrial fibrillation: Secondary | ICD-10-CM | POA: Diagnosis not present

## 2023-01-24 DIAGNOSIS — Z8673 Personal history of transient ischemic attack (TIA), and cerebral infarction without residual deficits: Secondary | ICD-10-CM

## 2023-01-24 DIAGNOSIS — E559 Vitamin D deficiency, unspecified: Secondary | ICD-10-CM | POA: Diagnosis present

## 2023-01-24 DIAGNOSIS — E1122 Type 2 diabetes mellitus with diabetic chronic kidney disease: Secondary | ICD-10-CM | POA: Diagnosis present

## 2023-01-24 DIAGNOSIS — E119 Type 2 diabetes mellitus without complications: Secondary | ICD-10-CM | POA: Diagnosis not present

## 2023-01-24 DIAGNOSIS — E785 Hyperlipidemia, unspecified: Secondary | ICD-10-CM | POA: Diagnosis present

## 2023-01-24 DIAGNOSIS — Z794 Long term (current) use of insulin: Secondary | ICD-10-CM

## 2023-01-24 DIAGNOSIS — I5032 Chronic diastolic (congestive) heart failure: Secondary | ICD-10-CM | POA: Diagnosis present

## 2023-01-24 DIAGNOSIS — I5030 Unspecified diastolic (congestive) heart failure: Secondary | ICD-10-CM | POA: Diagnosis present

## 2023-01-24 DIAGNOSIS — Z79899 Other long term (current) drug therapy: Secondary | ICD-10-CM

## 2023-01-24 DIAGNOSIS — Z7901 Long term (current) use of anticoagulants: Secondary | ICD-10-CM | POA: Diagnosis not present

## 2023-01-24 DIAGNOSIS — K529 Noninfective gastroenteritis and colitis, unspecified: Secondary | ICD-10-CM | POA: Diagnosis present

## 2023-01-24 DIAGNOSIS — Z8249 Family history of ischemic heart disease and other diseases of the circulatory system: Secondary | ICD-10-CM

## 2023-01-24 DIAGNOSIS — R531 Weakness: Secondary | ICD-10-CM | POA: Diagnosis not present

## 2023-01-24 DIAGNOSIS — Z951 Presence of aortocoronary bypass graft: Secondary | ICD-10-CM | POA: Diagnosis not present

## 2023-01-24 DIAGNOSIS — Z1152 Encounter for screening for COVID-19: Secondary | ICD-10-CM

## 2023-01-24 DIAGNOSIS — E872 Acidosis, unspecified: Secondary | ICD-10-CM | POA: Diagnosis present

## 2023-01-24 DIAGNOSIS — R7989 Other specified abnormal findings of blood chemistry: Secondary | ICD-10-CM

## 2023-01-24 DIAGNOSIS — I10 Essential (primary) hypertension: Secondary | ICD-10-CM | POA: Diagnosis present

## 2023-01-24 DIAGNOSIS — Z85038 Personal history of other malignant neoplasm of large intestine: Secondary | ICD-10-CM

## 2023-01-24 DIAGNOSIS — I130 Hypertensive heart and chronic kidney disease with heart failure and stage 1 through stage 4 chronic kidney disease, or unspecified chronic kidney disease: Secondary | ICD-10-CM | POA: Diagnosis present

## 2023-01-24 DIAGNOSIS — R55 Syncope and collapse: Secondary | ICD-10-CM | POA: Diagnosis not present

## 2023-01-24 DIAGNOSIS — N184 Chronic kidney disease, stage 4 (severe): Secondary | ICD-10-CM | POA: Diagnosis not present

## 2023-01-24 DIAGNOSIS — Z87891 Personal history of nicotine dependence: Secondary | ICD-10-CM

## 2023-01-24 DIAGNOSIS — Z823 Family history of stroke: Secondary | ICD-10-CM

## 2023-01-24 DIAGNOSIS — I2510 Atherosclerotic heart disease of native coronary artery without angina pectoris: Secondary | ICD-10-CM | POA: Diagnosis present

## 2023-01-24 DIAGNOSIS — W19XXXA Unspecified fall, initial encounter: Secondary | ICD-10-CM | POA: Diagnosis not present

## 2023-01-24 DIAGNOSIS — N179 Acute kidney failure, unspecified: Principal | ICD-10-CM | POA: Diagnosis present

## 2023-01-24 DIAGNOSIS — R0602 Shortness of breath: Secondary | ICD-10-CM | POA: Diagnosis not present

## 2023-01-24 DIAGNOSIS — R1111 Vomiting without nausea: Secondary | ICD-10-CM | POA: Diagnosis not present

## 2023-01-24 LAB — COMPREHENSIVE METABOLIC PANEL
ALT: 11 U/L (ref 0–44)
AST: 31 U/L (ref 15–41)
Albumin: 3.5 g/dL (ref 3.5–5.0)
Alkaline Phosphatase: 90 U/L (ref 38–126)
Anion gap: 25 — ABNORMAL HIGH (ref 5–15)
BUN: 97 mg/dL — ABNORMAL HIGH (ref 8–23)
CO2: 11 mmol/L — ABNORMAL LOW (ref 22–32)
Calcium: 8.9 mg/dL (ref 8.9–10.3)
Chloride: 103 mmol/L (ref 98–111)
Creatinine, Ser: 3.39 mg/dL — ABNORMAL HIGH (ref 0.61–1.24)
GFR, Estimated: 18 mL/min — ABNORMAL LOW (ref >60)
Glucose, Bld: 107 mg/dL — ABNORMAL HIGH (ref 70–99)
Potassium: 4.4 mmol/L (ref 3.5–5.1)
Sodium: 139 mmol/L (ref 135–145)
Total Bilirubin: 2 mg/dL — ABNORMAL HIGH (ref 0.3–1.2)
Total Protein: 7.5 g/dL (ref 6.5–8.1)

## 2023-01-24 LAB — CBC WITH DIFFERENTIAL/PLATELET
Abs Immature Granulocytes: 0.05 10*3/uL (ref 0.00–0.07)
Basophils Absolute: 0.1 10*3/uL (ref 0.0–0.1)
Basophils Relative: 0 %
Eosinophils Absolute: 0 10*3/uL (ref 0.0–0.5)
Eosinophils Relative: 0 %
HCT: 44.9 % (ref 39.0–52.0)
Hemoglobin: 14.3 g/dL (ref 13.0–17.0)
Immature Granulocytes: 0 %
Lymphocytes Relative: 10 %
Lymphs Abs: 1.3 10*3/uL (ref 0.7–4.0)
MCH: 27.1 pg (ref 26.0–34.0)
MCHC: 31.8 g/dL (ref 30.0–36.0)
MCV: 85 fL (ref 80.0–100.0)
Monocytes Absolute: 0.8 10*3/uL (ref 0.1–1.0)
Monocytes Relative: 6 %
Neutro Abs: 11.7 10*3/uL — ABNORMAL HIGH (ref 1.7–7.7)
Neutrophils Relative %: 84 %
Platelets: 309 10*3/uL (ref 150–400)
RBC: 5.28 MIL/uL (ref 4.22–5.81)
RDW: 14.6 % (ref 11.5–15.5)
WBC: 13.9 10*3/uL — ABNORMAL HIGH (ref 4.0–10.5)
nRBC: 0 % (ref 0.0–0.2)

## 2023-01-24 LAB — RAPID URINE DRUG SCREEN, HOSP PERFORMED
Amphetamines: NOT DETECTED
Barbiturates: NOT DETECTED
Benzodiazepines: NOT DETECTED
Cocaine: NOT DETECTED
Opiates: NOT DETECTED
Tetrahydrocannabinol: NOT DETECTED

## 2023-01-24 LAB — URINALYSIS, ROUTINE W REFLEX MICROSCOPIC
Bacteria, UA: NONE SEEN
Bilirubin Urine: NEGATIVE
Glucose, UA: 50 mg/dL — AB
Hgb urine dipstick: NEGATIVE
Ketones, ur: NEGATIVE mg/dL
Leukocytes,Ua: NEGATIVE
Nitrite: NEGATIVE
Protein, ur: 100 mg/dL — AB
Specific Gravity, Urine: 1.01 (ref 1.005–1.030)
pH: 5 (ref 5.0–8.0)

## 2023-01-24 LAB — BRAIN NATRIURETIC PEPTIDE: B Natriuretic Peptide: 117.4 pg/mL — ABNORMAL HIGH (ref 0.0–100.0)

## 2023-01-24 LAB — NA AND K (SODIUM & POTASSIUM), RAND UR
Potassium Urine: 33 mmol/L
Sodium, Ur: 55 mmol/L

## 2023-01-24 LAB — TROPONIN I (HIGH SENSITIVITY): Troponin I (High Sensitivity): 39 ng/L — ABNORMAL HIGH (ref <18)

## 2023-01-24 LAB — LIPASE, BLOOD: Lipase: 41 U/L (ref 11–51)

## 2023-01-24 MED ORDER — LACTATED RINGERS IV BOLUS
1000.0000 mL | Freq: Once | INTRAVENOUS | Status: AC
  Administered 2023-01-25: 1000 mL via INTRAVENOUS

## 2023-01-24 MED ORDER — STERILE WATER FOR INJECTION IV SOLN
INTRAVENOUS | Status: DC
  Filled 2023-01-24: qty 1000

## 2023-01-24 NOTE — ED Triage Notes (Signed)
Pt to the ed from home via ems with a CC of fall. Pt relays he hit his head on right side. Pt relays current headache. Pt denies LOC. Pt has been n/v x 4 month and unable to keep liquids down. Pt denies any dizziness, cp, fever, chills at this time.

## 2023-01-24 NOTE — ED Notes (Signed)
Patient transported to CT 

## 2023-01-24 NOTE — ED Provider Notes (Signed)
[facility] AT [facility] Provider Note   CSN: [id number] Arrival date & time: 01/24/23  9622     History Chief Complaint  Patient presents with   Fall    HPI Jake Gross is a 82 y.o. male presenting for weakness and falls.  He is an 82 year old male with an extensive medical history.  History includes diabetes, hypertension, colon cancer status post reduction, CVA, HLD, CHF, history of renal disease.  States that he has been doing well since his last discharge until approximately 72 hours ago he started getting lightheaded frequently has had 3 falls in 48 hours.  Is on Eliquis for his advanced heart failure.  Denies fevers chills nausea vomiting syncope or shortness of breath.   Patient's recorded medical, surgical, social, medication list and allergies were reviewed in the Snapshot window as part of the initial history.   Review of Systems   Review of Systems  Constitutional:  Positive for fatigue. Negative for chills and fever.  HENT:  Negative for ear pain and sore throat.   Eyes:  Negative for pain and visual disturbance.  Respiratory:  Negative for cough and shortness of breath.   Cardiovascular:  Negative for chest pain and palpitations.  Gastrointestinal:  Negative for abdominal pain and vomiting.  Genitourinary:  Negative for dysuria and hematuria.  Musculoskeletal:  Negative for arthralgias and back pain.  Skin:  Negative for color change and rash.  Neurological:  Positive for weakness. Negative for seizures and syncope.  All other systems reviewed and are negative.   Physical Exam Updated Vital Signs BP (!) 141/83   Pulse 83   Temp 97.9 F (36.6 C) (Oral)   Resp 15   Ht 5\' 7"  (1.702 m)   Wt 86.2 kg   SpO2 96%   BMI 29.76 kg/m  Physical Exam Vitals and nursing note reviewed.  Constitutional:      General: He is not in acute distress.    Appearance: He is well-developed.  HENT:     Head: Normocephalic and atraumatic.   Eyes:     Conjunctiva/sclera: Conjunctivae normal.  Cardiovascular:     Rate and Rhythm: Normal rate and regular rhythm.     Heart sounds: No murmur heard. Pulmonary:     Effort: Pulmonary effort is normal. No respiratory distress.     Breath sounds: Normal breath sounds.  Abdominal:     Palpations: Abdomen is soft.     Tenderness: There is no abdominal tenderness.  Musculoskeletal:        General: No swelling.     Cervical back: Neck supple.  Skin:    General: Skin is warm and dry.     Capillary Refill: Capillary refill takes less than 2 seconds.  Neurological:     Mental Status: He is alert.  Psychiatric:        Mood and Affect: Mood normal.      ED Course/ Medical Decision Making/ A&P Clinical Course as of 01/24/23 2352  Wed Jan 24, 2023  2331 Dr. Julian Reil [CC]    Clinical Course User Index [CC] Glyn Ade, MD    Procedures .Critical Care  Performed by: Glyn Ade, MD Authorized by: Glyn Ade, MD   Critical care provider statement:    Critical care time (minutes):  30   Critical care was necessary to treat or prevent imminent or life-threatening deterioration of the following conditions:  Renal failure and metabolic crisis   Critical care was time spent personally by  me on the following activities:  Development of treatment plan with patient or surrogate, discussions with consultants, evaluation of patient's response to treatment, examination of patient, ordering and review of laboratory studies, ordering and review of radiographic studies, ordering and performing treatments and interventions, pulse oximetry, re-evaluation of patient's condition and review of old charts   Care discussed with: admitting provider      Medications Ordered in ED Medications  lactated ringers bolus 1,000 mL (has no administration in time range)  sodium bicarbonate 150 mEq in sterile water 1,150 mL infusion (has no administration in time range)    Medical  Decision Making:   This is a 82 year old male presenting with a fall.  He has had multiple days of ongoingWeakness fatigue poor p.o. intake and has had 3 falls over the last 2 days.  He has not hit his head.  He has no acute injuries. States that he has just hadDays of malaise and fatigue as well as he feels some intermittent confusion. Given this presentation, evaluation for traumatic injuries was initiated with CT head and chest x-ray with no focal pathology detected.  Screening labs as far as the underlying etiology of his altered mental status was initiated.  A metabolic anion gap acidosis was detected.  Concern for likely azotemia as the etiology possibly prerenal given his underlying kidney disease and disproportionate BUN to creatinine ratio though still nonspecific at this time.  Renal ultrasound was ordered to evaluate for obstructive uropathy and medicine was consulted for further diagnostic care and management. They recommended initiation of sodium bicarb for suspected RTA and arrange for admission for further care and management.  Patient is critically ill with developing acute renal failure required frequent reassessment in the emergency room as well as ongoing care and management. Disposition:  I have considered need for hospitalization, however, considering all of the above, I believe this patient is stable for discharge at this time.  Patient/family educated about specific return precautions for given chief complaint and symptoms.  Patient/family educated about follow-up with PCP.     Patient/family expressed understanding of return precautions and need for follow-up. Patient spoken to regarding all imaging and laboratory results and appropriate follow up for these results. All education provided in verbal form with additional information in written form. Time was allowed for answering of patient questions. Patient discharged.    Emergency Department Medication Summary:   Medications   lactated ringers bolus 1,000 mL (has no administration in time range)  sodium bicarbonate 150 mEq in sterile water 1,150 mL infusion (has no administration in time range)        Clinical Impression:  1. Fall, initial encounter   2. Azotemia      Admit   Final Clinical Impression(s) / ED Diagnoses Final diagnoses:  Fall, initial encounter  Azotemia    Rx / DC Orders ED Discharge Orders     None         Glyn Ade, MD 01/24/23 2352

## 2023-01-24 NOTE — ED Notes (Signed)
Patient transported to ultrasound.

## 2023-01-24 NOTE — ED Notes (Signed)
Pt returned from CT °

## 2023-01-24 NOTE — ED Notes (Signed)
Previous RN collected and sent a full rainbow of blood specimens to main lab

## 2023-01-25 DIAGNOSIS — Z833 Family history of diabetes mellitus: Secondary | ICD-10-CM | POA: Diagnosis not present

## 2023-01-25 DIAGNOSIS — N184 Chronic kidney disease, stage 4 (severe): Secondary | ICD-10-CM | POA: Diagnosis present

## 2023-01-25 DIAGNOSIS — Z87891 Personal history of nicotine dependence: Secondary | ICD-10-CM | POA: Diagnosis not present

## 2023-01-25 DIAGNOSIS — Z7901 Long term (current) use of anticoagulants: Secondary | ICD-10-CM | POA: Diagnosis not present

## 2023-01-25 DIAGNOSIS — Z794 Long term (current) use of insulin: Secondary | ICD-10-CM | POA: Diagnosis not present

## 2023-01-25 DIAGNOSIS — N179 Acute kidney failure, unspecified: Secondary | ICD-10-CM | POA: Diagnosis not present

## 2023-01-25 DIAGNOSIS — E1122 Type 2 diabetes mellitus with diabetic chronic kidney disease: Secondary | ICD-10-CM | POA: Diagnosis present

## 2023-01-25 DIAGNOSIS — I10 Essential (primary) hypertension: Secondary | ICD-10-CM

## 2023-01-25 DIAGNOSIS — I5032 Chronic diastolic (congestive) heart failure: Secondary | ICD-10-CM | POA: Diagnosis not present

## 2023-01-25 DIAGNOSIS — Z1152 Encounter for screening for COVID-19: Secondary | ICD-10-CM | POA: Diagnosis not present

## 2023-01-25 DIAGNOSIS — Z85038 Personal history of other malignant neoplasm of large intestine: Secondary | ICD-10-CM | POA: Diagnosis not present

## 2023-01-25 DIAGNOSIS — Z79899 Other long term (current) drug therapy: Secondary | ICD-10-CM | POA: Diagnosis not present

## 2023-01-25 DIAGNOSIS — I480 Paroxysmal atrial fibrillation: Secondary | ICD-10-CM

## 2023-01-25 DIAGNOSIS — K529 Noninfective gastroenteritis and colitis, unspecified: Secondary | ICD-10-CM | POA: Diagnosis present

## 2023-01-25 DIAGNOSIS — E119 Type 2 diabetes mellitus without complications: Secondary | ICD-10-CM | POA: Diagnosis not present

## 2023-01-25 DIAGNOSIS — I130 Hypertensive heart and chronic kidney disease with heart failure and stage 1 through stage 4 chronic kidney disease, or unspecified chronic kidney disease: Secondary | ICD-10-CM | POA: Diagnosis present

## 2023-01-25 DIAGNOSIS — I2510 Atherosclerotic heart disease of native coronary artery without angina pectoris: Secondary | ICD-10-CM | POA: Diagnosis present

## 2023-01-25 DIAGNOSIS — E872 Acidosis, unspecified: Secondary | ICD-10-CM | POA: Diagnosis present

## 2023-01-25 DIAGNOSIS — Z8673 Personal history of transient ischemic attack (TIA), and cerebral infarction without residual deficits: Secondary | ICD-10-CM | POA: Diagnosis not present

## 2023-01-25 DIAGNOSIS — Z8249 Family history of ischemic heart disease and other diseases of the circulatory system: Secondary | ICD-10-CM | POA: Diagnosis not present

## 2023-01-25 DIAGNOSIS — Z951 Presence of aortocoronary bypass graft: Secondary | ICD-10-CM | POA: Diagnosis not present

## 2023-01-25 DIAGNOSIS — E559 Vitamin D deficiency, unspecified: Secondary | ICD-10-CM | POA: Diagnosis present

## 2023-01-25 DIAGNOSIS — Z823 Family history of stroke: Secondary | ICD-10-CM | POA: Diagnosis not present

## 2023-01-25 DIAGNOSIS — E785 Hyperlipidemia, unspecified: Secondary | ICD-10-CM | POA: Diagnosis present

## 2023-01-25 LAB — I-STAT VENOUS BLOOD GAS, ED
Acid-base deficit: 4 mmol/L — ABNORMAL HIGH (ref 0.0–2.0)
Bicarbonate: 20 mmol/L (ref 20.0–28.0)
Calcium, Ion: 1.16 mmol/L (ref 1.15–1.40)
HCT: 43 % (ref 39.0–52.0)
Hemoglobin: 14.6 g/dL (ref 13.0–17.0)
O2 Saturation: 76 %
Potassium: 3.9 mmol/L (ref 3.5–5.1)
Sodium: 138 mmol/L (ref 135–145)
TCO2: 21 mmol/L — ABNORMAL LOW (ref 22–32)
pCO2, Ven: 33.3 mmHg — ABNORMAL LOW (ref 44–60)
pH, Ven: 7.387 (ref 7.25–7.43)
pO2, Ven: 41 mmHg (ref 32–45)

## 2023-01-25 LAB — GLUCOSE, CAPILLARY
Glucose-Capillary: 102 mg/dL — ABNORMAL HIGH (ref 70–99)
Glucose-Capillary: 102 mg/dL — ABNORMAL HIGH (ref 70–99)
Glucose-Capillary: 117 mg/dL — ABNORMAL HIGH (ref 70–99)
Glucose-Capillary: 127 mg/dL — ABNORMAL HIGH (ref 70–99)
Glucose-Capillary: 156 mg/dL — ABNORMAL HIGH (ref 70–99)
Glucose-Capillary: 64 mg/dL — ABNORMAL LOW (ref 70–99)

## 2023-01-25 LAB — BLOOD GAS, VENOUS
Acid-base deficit: 3.3 mmol/L — ABNORMAL HIGH (ref 0.0–2.0)
Bicarbonate: 21.3 mmol/L (ref 20.0–28.0)
O2 Saturation: 86.2 %
Patient temperature: 36.6
pCO2, Ven: 35 mmHg — ABNORMAL LOW (ref 44–60)
pH, Ven: 7.39 (ref 7.25–7.43)
pO2, Ven: 52 mmHg — ABNORMAL HIGH (ref 32–45)

## 2023-01-25 LAB — BASIC METABOLIC PANEL
Anion gap: 13 (ref 5–15)
BUN: 95 mg/dL — ABNORMAL HIGH (ref 8–23)
CO2: 21 mmol/L — ABNORMAL LOW (ref 22–32)
Calcium: 8.7 mg/dL — ABNORMAL LOW (ref 8.9–10.3)
Chloride: 102 mmol/L (ref 98–111)
Creatinine, Ser: 3.28 mg/dL — ABNORMAL HIGH (ref 0.61–1.24)
GFR, Estimated: 18 mL/min — ABNORMAL LOW (ref >60)
Glucose, Bld: 94 mg/dL (ref 70–99)
Potassium: 3.7 mmol/L (ref 3.5–5.1)
Sodium: 136 mmol/L (ref 135–145)

## 2023-01-25 LAB — CBC
HCT: 39.5 % (ref 39.0–52.0)
Hemoglobin: 13.4 g/dL (ref 13.0–17.0)
MCH: 27.2 pg (ref 26.0–34.0)
MCHC: 33.9 g/dL (ref 30.0–36.0)
MCV: 80.3 fL (ref 80.0–100.0)
Platelets: 266 10*3/uL (ref 150–400)
RBC: 4.92 MIL/uL (ref 4.22–5.81)
RDW: 14.5 % (ref 11.5–15.5)
WBC: 12.8 10*3/uL — ABNORMAL HIGH (ref 4.0–10.5)
nRBC: 0 % (ref 0.0–0.2)

## 2023-01-25 LAB — TROPONIN I (HIGH SENSITIVITY): Troponin I (High Sensitivity): 48 ng/L — ABNORMAL HIGH (ref <18)

## 2023-01-25 LAB — CK: Total CK: 166 U/L (ref 49–397)

## 2023-01-25 MED ORDER — LACTATED RINGERS IV SOLN: INTRAVENOUS | Status: DC

## 2023-01-25 MED ORDER — ACETAMINOPHEN 650 MG RE SUPP: 650.0000 mg | Freq: Four times a day (QID) | RECTAL | Status: DC | PRN

## 2023-01-25 MED ORDER — INSULIN GLARGINE-YFGN 100 UNIT/ML ~~LOC~~ SOLN
30.0000 [IU] | Freq: Every day | SUBCUTANEOUS | Status: DC
  Filled 2023-01-25 (×3): qty 0.3

## 2023-01-25 MED ORDER — APIXABAN 2.5 MG PO TABS
2.5000 mg | ORAL_TABLET | Freq: Two times a day (BID) | ORAL | Status: DC
  Administered 2023-01-25 – 2023-01-26 (×3): 2.5 mg via ORAL
  Filled 2023-01-25 (×4): qty 1

## 2023-01-25 MED ORDER — METOPROLOL TARTRATE 25 MG PO TABS
37.5000 mg | ORAL_TABLET | Freq: Two times a day (BID) | ORAL | Status: DC
  Administered 2023-01-25 (×2): 37.5 mg via ORAL
  Filled 2023-01-25 (×4): qty 2

## 2023-01-25 MED ORDER — ISOSORBIDE MONONITRATE ER 60 MG PO TB24
60.0000 mg | ORAL_TABLET | Freq: Every day | ORAL | Status: DC
  Administered 2023-01-25 – 2023-01-26 (×2): 60 mg via ORAL
  Filled 2023-01-25 (×2): qty 1

## 2023-01-25 MED ORDER — INSULIN GLARGINE-YFGN 100 UNIT/ML ~~LOC~~ SOLN
20.0000 [IU] | Freq: Every day | SUBCUTANEOUS | Status: DC
  Administered 2023-01-26: 20 [IU] via SUBCUTANEOUS
  Filled 2023-01-25: qty 0.2

## 2023-01-25 MED ORDER — TAMSULOSIN HCL 0.4 MG PO CAPS
0.4000 mg | ORAL_CAPSULE | Freq: Every day | ORAL | Status: DC
  Administered 2023-01-25 – 2023-01-26 (×2): 0.4 mg via ORAL
  Filled 2023-01-25 (×2): qty 1

## 2023-01-25 MED ORDER — ROSUVASTATIN CALCIUM 20 MG PO TABS
20.0000 mg | ORAL_TABLET | Freq: Every day | ORAL | Status: DC
  Administered 2023-01-25 (×2): 20 mg via ORAL
  Filled 2023-01-25 (×2): qty 1

## 2023-01-25 MED ORDER — ACETAMINOPHEN 325 MG PO TABS: 650.0000 mg | ORAL_TABLET | Freq: Four times a day (QID) | ORAL | Status: DC | PRN

## 2023-01-25 MED ORDER — LACTATED RINGERS IV SOLN: INTRAVENOUS | Status: AC

## 2023-01-25 MED ORDER — INSULIN ASPART 100 UNIT/ML IJ SOLN
0.0000 [IU] | INTRAMUSCULAR | Status: DC
  Administered 2023-01-25: 1 [IU] via SUBCUTANEOUS

## 2023-01-25 NOTE — Assessment & Plan Note (Signed)
Takes glargine 55u in AM at home. Due to AKI and persistent N/V: will put on 30u daily here And Q4H SSI sensitive scale.

## 2023-01-25 NOTE — Progress Notes (Signed)
Subjective: Patient admitted this morning, see detailed H&P by Dr Julian Reil 82 year old male with medical history of diabetes mellitus type 2, hypertension, CHF, CKD stage IV presents to ED with generalized weakness and falls.  Patient stated that since last discharge 72 hours ago he has been getting lightheaded frequently with 3 falls in 48 hours.  Patient is on Eliquis for paroxysmal atrial fibrillation.  Also has been having nausea vomiting for past 4 days.  Patient found to be in acute kidney injury on CKD stage IV.  Vitals:   01/25/23 0303 01/25/23 0817  BP: 133/66 132/68  Pulse: 80 (!) 55  Resp: 18 17  Temp: 97.7 F (36.5 C) 98.1 F (36.7 C)  SpO2: 96% 97%      A/P Acute kidney injury on CKD stage IV -Started on LR at 125 mill per hour, will cut down LR to 100 mill per hour for 12 hours -Follow renal function in a.m.  Nausea and vomiting -Resolved -Continue Zofran as needed  Diabetes mellitus type 2 -Continue sliding scale insulin with NovoLog -CBG has been on lower side, this morning Semglee 30 units was held due to low blood glucose -Will change Semglee to 20 units subcu daily from tomorrow morning -Continue CBG monitoring before every meal and at bedtime  Paroxysmal atrial fibrillation -Continue Eliquis, metoprolol  Hypertension -Continue metoprolol    Meredeth Ide [facility]

## 2023-01-25 NOTE — ED Notes (Signed)
ED TO INPATIENT HANDOFF REPORT  ED Nurse Name and Phone #: Pennie Rushing A. Lyrik Buresh, RN [phone]  S Name/Age/Gender Jake Gross 82 y.o. male Room/Bed: 038C/038C  Code Status   Code Status: Full Code  Home/SNF/Other Home Patient oriented to: self, place, time, and situation Is this baseline? Yes   Triage Complete: Triage complete  Chief Complaint AKI (acute kidney injury) [N17.9]  Triage Note Pt to the ed from home via ems with a CC of fall. Pt relays he hit his head on right side. Pt relays current headache. Pt denies LOC. Pt has been n/v x 4 month and unable to keep liquids down. Pt denies any dizziness, cp, fever, chills at this time.    Allergies No Known Allergies  Level of Care/Admitting Diagnosis ED Disposition     ED Disposition  Admit   Condition  --   Comment  Hospital Area: [facility] [[id number]]  Level of Care: Med-Surg [16]  May place patient in observation at [facility] or [facility] if equivalent level of care is available:: No  Covid Evaluation: Asymptomatic - no recent exposure (last 10 days) testing not required  Diagnosis: AKI (acute kidney injury) [701410]  Admitting Physician: Hillary Bow [[id number]]  Attending Physician: Hillary Bow [[id number]]          B Medical/Surgery History 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    Arthritis    AVM (arteriovenous malformation) of small bowel, 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   CAD (coronary artery disease) 09/29/2019   Cervical spondylosis    Chronic anticoagulation    Chronic diastolic CHF (congestive heart failure) (HCC) 12/17/2018   CKD (chronic kidney disease) 12/17/2018   Diabetes mellitus    History of GI bleed 09/29/2019   Hyperlipidemia    Hypertension    Iron deficiency anemia due to chronic blood loss     Paroxysmal atrial fibrillation (HCC) 12/17/2018   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 NO ISCHEMIA   COLONOSCOPY W/ POLYPECTOMY     5 polyps   COLONOSCOPY WITH PROPOFOL N/A 08/29/2019   Procedure: COLONOSCOPY WITH PROPOFOL;  Surgeon: Sherrilyn Rist, MD;  Location: [facility] ENDOSCOPY;  Service: Gastroenterology;  Laterality: N/A;   CORONARY ARTERY BYPASS GRAFT N/A 11/26/2018   Procedure: CORONARY ARTERY BYPASS GRAFTING (CABG), ON PUMP, TIMES FOUR, USING LEFT INTERNAL MAMMARY ARTERY AND ENDOSCOPICALLY HARVESTED LEFT SAPHENOUS VEIN;  Surgeon: Alleen Borne, MD;  Location: [facility] OR;  Service: Open Heart Surgery;  Laterality: N/A;   ESOPHAGOGASTRODUODENOSCOPY (EGD) WITH PROPOFOL N/A 08/29/2019   Procedure: ESOPHAGOGASTRODUODENOSCOPY (EGD) WITH PROPOFOL;  Surgeon: Sherrilyn Rist, MD;  Location: [facility] ENDOSCOPY;  Service: Gastroenterology;  Laterality: N/A;   EUS N/A 03/11/2015   Procedure: LOWER ENDOSCOPIC ULTRASOUND (EUS);  Surgeon: Rachael Fee, MD;  Location: [facility] ENDOSCOPY;  Service: Endoscopy;  Laterality: N/A;   FLEXIBLE SIGMOIDOSCOPY N/A 02/02/2015   Procedure: FLEXIBLE SIGMOIDOSCOPY;  Surgeon: Louis Meckel, MD;  Location: [facility] ENDOSCOPY;  Service: Endoscopy;  Laterality: N/A;  ERBE   HEMOSTASIS CLIP PLACEMENT  08/29/2019   Procedure: HEMOSTASIS CLIP PLACEMENT;  Surgeon: Myrtie Neither,  Andreas Blower, MD;  Location: [facility] ENDOSCOPY;  Service: Gastroenterology;;   HOT HEMOSTASIS N/A 08/29/2019   Procedure: HOT HEMOSTASIS (ARGON PLASMA COAGULATION/BICAP);  Surgeon: Sherrilyn Rist, MD;  Location: [facility] ENDOSCOPY;  Service: Gastroenterology;  Laterality: N/A;   LEFT HEART CATH AND CORONARY ANGIOGRAPHY N/A 11/20/2018   Procedure: LEFT HEART CATH AND CORONARY ANGIOGRAPHY;  Surgeon: Kathleene Hazel, MD;  Location: [facility] INVASIVE  CV LAB;  Service: Cardiovascular;  Laterality: N/A;   LOOP RECORDER INSERTION N/A 08/14/2017   Procedure: LOOP RECORDER INSERTION;  Surgeon: Hillis Range, MD;  Location: [facility] INVASIVE CV LAB;  Service: Cardiovascular;  Laterality: N/A;   PARTIAL PROCTECTOMY BY TEM N/A 04/08/2015   Procedure: TEM PARTIAL PROCTECTOMY OF RECTAL MASS;  Surgeon: Karie Soda, MD;  Location: [facility] ORS;  Service: General;  Laterality: N/A;   POLYPECTOMY  08/29/2019   Procedure: POLYPECTOMY;  Surgeon: Sherrilyn Rist, MD;  Location: [facility] ENDOSCOPY;  Service: Gastroenterology;;   TEE WITHOUT CARDIOVERSION N/A 06/25/2017   Procedure: TRANSESOPHAGEAL ECHOCARDIOGRAM (TEE);  Surgeon: Chilton Si, MD;  Location: [facility] ENDOSCOPY;  Service: Cardiovascular;  Laterality: N/A;   TEE WITHOUT CARDIOVERSION N/A 11/26/2018   Procedure: TRANSESOPHAGEAL ECHOCARDIOGRAM (TEE);  Surgeon: Alleen Borne, MD;  Location: [facility] OR;  Service: Open Heart Surgery;  Laterality: N/A;   TONSILLECTOMY AND ADENOIDECTOMY     as child     A IV Location/Drains/Wounds Patient Lines/Drains/Airways Status     Active Line/Drains/Airways     Name Placement date Placement time Site Days   Peripheral IV 01/24/23 20 G Posterior;Right Wrist 01/24/23  2334  Wrist  1            Intake/Output Last 24 hours No intake or output data in the 24 hours ending 01/25/23 0144  Labs/Imaging Results for orders placed or performed during the hospital encounter of 01/24/23 (from the past 48 hour(s))  CBC with Differential     Status: Abnormal   Collection Time: 01/24/23  7:03 PM  Result Value Ref Range   WBC 13.9 (H) 4.0 - 10.5 K/uL   RBC 5.28 4.22 - 5.81 MIL/uL   Hemoglobin 14.3 13.0 - 17.0 g/dL   HCT 16.1 09.6 - 04.5 %   MCV 85.0 80.0 - 100.0 fL   MCH 27.1 26.0 - 34.0 pg   MCHC 31.8 30.0 - 36.0 g/dL   RDW 40.9 81.1 - 91.4 %   Platelets 309 150 - 400 K/uL   nRBC 0.0 0.0 - 0.2 %   Neutrophils Relative % 84 %   Neutro Abs 11.7 (H) 1.7 - 7.7 K/uL    Lymphocytes Relative 10 %   Lymphs Abs 1.3 0.7 - 4.0 K/uL   Monocytes Relative 6 %   Monocytes Absolute 0.8 0.1 - 1.0 K/uL   Eosinophils Relative 0 %   Eosinophils Absolute 0.0 0.0 - 0.5 K/uL   Basophils Relative 0 %   Basophils Absolute 0.1 0.0 - 0.1 K/uL   Immature Granulocytes 0 %   Abs Immature Granulocytes 0.05 0.00 - 0.07 K/uL    Comment: Performed at [facility], [address], [place], [place] [address]  Comprehensive metabolic panel     Status: Abnormal   Collection Time: 01/24/23  7:03 PM  Result Value Ref Range   Sodium 139 135 - 145 mmol/L   Potassium 4.4 3.5 - 5.1 mmol/L   Chloride 103 98 - 111 mmol/L   CO2 11 (L) 22 - 32 mmol/L   Glucose, Bld 107 (  H) 70 - 99 mg/dL    Comment: Glucose reference range applies only to samples taken after fasting for at least 8 hours.   BUN 97 (H) 8 - 23 mg/dL   Creatinine, Ser 0.93 (H) 0.61 - 1.24 mg/dL   Calcium 8.9 8.9 - 81.8 mg/dL   Total Protein 7.5 6.5 - 8.1 g/dL   Albumin 3.5 3.5 - 5.0 g/dL   AST 31 15 - 41 U/L    Comment: HEMOLYSIS AT THIS LEVEL MAY AFFECT RESULT   ALT 11 0 - 44 U/L    Comment: HEMOLYSIS AT THIS LEVEL MAY AFFECT RESULT   Alkaline Phosphatase 90 38 - 126 U/L   Total Bilirubin 2.0 (H) 0.3 - 1.2 mg/dL    Comment: HEMOLYSIS AT THIS LEVEL MAY AFFECT RESULT   GFR, Estimated 18 (L) >60 mL/min    Comment: (NOTE) Calculated using the CKD-EPI Creatinine Equation (2021)    Anion gap 25 (H) 5 - 15    Comment: ELECTROLYTES REPEATED TO VERIFY Performed at [facility], [address] [place], [place] [address]   Lipase, blood     Status: None   Collection Time: 01/24/23  7:03 PM  Result Value Ref Range   Lipase 41 11 - 51 U/L    Comment: Performed at [facility], [address], [place], [place] [address]  Troponin I (High Sensitivity)     Status: Abnormal   Collection Time: 01/24/23  7:03 PM  Result Value Ref Range   Troponin I (High Sensitivity) 39 (H) <18 ng/L    Comment: (NOTE) Elevated  high sensitivity troponin I (hsTnI) values and significant  changes across serial measurements may suggest ACS but many other  chronic and acute conditions are known to elevate hsTnI results.  Refer to the "Links" section for chest pain algorithms and additional  guidance. Performed at [facility], [address], [place], [place] [address]   Brain natriuretic peptide     Status: Abnormal   Collection Time: 01/24/23  7:03 PM  Result Value Ref Range   B Natriuretic Peptide 117.4 (H) 0.0 - 100.0 pg/mL    Comment: Performed at [facility], [address], [place], [place] [address]  Troponin I (High Sensitivity)     Status: Abnormal   Collection Time: 01/24/23 10:20 PM  Result Value Ref Range   Troponin I (High Sensitivity) 48 (H) <18 ng/L    Comment: (NOTE) Elevated high sensitivity troponin I (hsTnI) values and significant  changes across serial measurements may suggest ACS but many other  chronic and acute conditions are known to elevate hsTnI results.  Refer to the "Links" section for chest pain algorithms and additional  guidance. Performed at [facility], [address] [place], [place] [address]   Urinalysis, Routine w reflex microscopic -Urine, Clean Catch     Status: Abnormal   Collection Time: 01/24/23 10:20 PM  Result Value Ref Range   Color, Urine YELLOW YELLOW   APPearance CLEAR CLEAR   Specific Gravity, Urine 1.010 1.005 - 1.030   pH 5.0 5.0 - 8.0   Glucose, UA 50 (A) NEGATIVE mg/dL   Hgb urine dipstick NEGATIVE NEGATIVE   Bilirubin Urine NEGATIVE NEGATIVE   Ketones, ur NEGATIVE NEGATIVE mg/dL   Protein, ur 277 (A) NEGATIVE mg/dL   Nitrite NEGATIVE NEGATIVE   Leukocytes,Ua NEGATIVE NEGATIVE   RBC / HPF 0-5 0 - 5 RBC/hpf   WBC, UA 0-5 0 - 5  WBC/hpf   Bacteria, UA NONE SEEN NONE SEEN   Squamous Epithelial / HPF 0-5 0 - 5 /HPF    Comment: Performed at [facility], [address] [place], [place] [address]  Rapid urine drug screen  (hospital performed)     Status: None   Collection Time: 01/24/23 10:20 PM  Result Value Ref Range   Opiates NONE DETECTED NONE DETECTED   Cocaine NONE DETECTED NONE DETECTED   Benzodiazepines NONE DETECTED NONE DETECTED   Amphetamines NONE DETECTED NONE DETECTED   Tetrahydrocannabinol NONE DETECTED NONE DETECTED   Barbiturates NONE DETECTED NONE DETECTED    Comment: (NOTE) DRUG SCREEN FOR MEDICAL PURPOSES ONLY.  IF CONFIRMATION IS NEEDED FOR ANY PURPOSE, NOTIFY LAB WITHIN 5 DAYS.  LOWEST DETECTABLE LIMITS FOR URINE DRUG SCREEN Drug Class                     Cutoff (ng/mL) Amphetamine and metabolites    1000 Barbiturate and metabolites    200 Benzodiazepine                 200 Opiates and metabolites        300 Cocaine and metabolites        300 THC                            50 Performed at [facility], [address] [place], [place] [address]   CK     Status: None   Collection Time: 01/24/23 10:20 PM  Result Value Ref Range   Total CK 166 49 - 397 U/L    Comment: Performed at [facility], [address] [place], [place] [address]  Na and K (sodium & potassium), rand urine     Status: None   Collection Time: 01/24/23 10:20 PM  Result Value Ref Range   Sodium, Ur 55 mmol/L   Potassium Urine 33 mmol/L    Comment: Performed at [facility], [address] [place], [place] [address]  I-Stat venous blood gas, ED     Status: Abnormal   Collection Time: 01/25/23  1:22 AM  Result Value Ref Range   pH, Ven 7.387 7.25 - 7.43   pCO2, Ven 33.3 (L) 44 - 60 mmHg   pO2, Ven 41 32 - 45 mmHg   Bicarbonate 20.0 20.0 - 28.0 mmol/L   TCO2 21 (L) 22 - 32 mmol/L   O2 Saturation 76 %   Acid-base deficit 4.0 (H) 0.0 - 2.0 mmol/L   Sodium 138 135 - 145 mmol/L   Potassium 3.9 3.5 - 5.1 mmol/L   Calcium, Ion 1.16 1.15 - 1.40 mmol/L   HCT 43.0 39.0 - 52.0 %   Hemoglobin 14.6 13.0 - 17.0 g/dL   Sample type VENOUS    US RENAL  Result Date: 01/25/2023 CLINICAL DATA:   Acute renal injury EXAM: RENAL / URINARY TRACT ULTRASOUND COMPLETE COMPARISON:  12/01/2019, 09/27/2022 FINDINGS: Right Kidney: Renal measurements: 10.8 x 6.0 x 5.3 cm. = volume: 178 mL. Mild increased echogenicity is noted. No mass or hydronephrosis is noted. Left Kidney: Renal measurements: 11.5 x 5.9 x 5.6 cm. = volume: 198 mL. Mild increased echogenicity is noted. No mass lesion or hydronephrosis is noted. Bladder: Appears normal for degree of bladder distention. Other: None. IMPRESSION: Increased echogenicity consistent with medical renal disease. No other focal abnormality is noted. Electronically Signed   By: Loraine LericheMark  Lukens M.D.   On: 01/25/2023 00:03   DG Chest Portable 1 View  Result Date: 01/24/2023 CLINICAL DATA:  Shortness of breath EXAM: PORTABLE CHEST 1 VIEW COMPARISON:  09/27/2022 FINDINGS: Cardiac size is within normal limits. 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 There are no signs of pulmonary edema or focal pulmonary consolidation. There is poor inspiration. There is no pleural effusion or pneumothorax. There is a implanted cardiac monitoring device in left chest wall. IMPRESSION: There are no new infiltrates or signs of pulmonary edema. Electronically Signed   By: Ernie Avena M.D.   On: 01/24/2023 20:32   CT HEAD WO CONTRAST ( )  Result Date: 01/24/2023 CLINICAL DATA:  Recent fall with headaches, initial encounter EXAM: CT HEAD WITHOUT CONTRAST TECHNIQUE: Contiguous axial images were obtained from the base of the skull through the vertex without intravenous contrast. RADIATION DOSE REDUCTION: This exam was performed according to the departmental dose-optimization program which includes automated exposure control, adjustment of the mA and/or kV according to patient size and/or use of iterative reconstruction technique. COMPARISON:  09/27/2022 FINDINGS: Brain: No evidence of acute infarction, hemorrhage, hydrocephalus, extra-axial collection or mass lesion/mass effect. 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 Generalized atrophic changes are noted. Mild chronic white matter ischemic changes are seen. Vascular: No hyperdense vessel or unexpected calcification. Skull: Normal. Negative for fracture or focal lesion. Sinuses/Orbits: No acute finding. Other: None. IMPRESSION: Chronic atrophic and ischemic changes without acute abnormality. Electronically Signed   By: Alcide Clever M.D.   On: 01/24/2023 19:31    Pending Labs Unresulted Labs (From admission, onward)     Start     Ordered   01/25/23 0500  CBC  Tomorrow morning,   R        01/25/23 0131   01/25/23 0500  Basic metabolic panel  Tomorrow morning,   R        01/25/23 0131   01/25/23 0102  Blood gas, venous  ONCE - STAT,   STAT        01/25/23 0101            Vitals/Pain Today's Vitals   01/25/23 0030 01/25/23 0045 01/25/23 0100 01/25/23 0115  BP: (!) 171/83 (!) 178/87 (!) 167/85 (!) 167/89  Pulse: 81 73 75 80  Resp: Temp:      TempSrc:      SpO2: 94% 95% 97% 95%  Weight:      Height:      PainSc:        Isolation Precautions No active isolations  Medications Medications  acetaminophen (TYLENOL) tablet 650 mg (has no administration in time range)    Or  acetaminophen (TYLENOL) suppository 650 mg (has no administration in time range)  insulin glargine-yfgn (SEMGLEE) injection 30 Units (has no administration in time range)  insulin aspart (novoLOG) injection 0-9 Units (has no administration in time range)  apixaban (ELIQUIS) tablet 2.5 mg (has no administration in time range)  rosuvastatin (CRESTOR) tablet 20 mg (has no administration in time range)  tamsulosin (FLOMAX) capsule 0.4 mg (has no administration in time range)  isosorbide mononitrate (IMDUR) 24 hr tablet 60 mg (has no administration in time range)  Metoprolol Tartrate TABS 37.5 mg (has no administration in time range)  lactated ringers bolus 1,000 mL (1,000 mLs Intravenous New Bag/Given 01/25/23  0142)    Mobility walks with person assist     Focused Assessments Cardiac Assessment Handoff:    Lab Results  Component Value Date   CKTOTAL 166 01/24/2023   CKMB 7.6 (H) 11/12/2018   TROPONINI <0.03 03/25/2018   No results found for: "DDIMER" Does the Patient currently have chest pain? No   , Neuro Assessment Handoff:  Swallow screen pass? Yes          Neuro Assessment:   Neuro Checks:      Has TPA been given? No If patient is a Neuro Trauma and patient is going to OR before floor call report to 4N Charge nurse: [phone] or [phone]   R Recommendations: See Admitting Provider Note  Report given to:   Additional Notes: Call or epic message for any additional questions

## 2023-01-25 NOTE — Assessment & Plan Note (Signed)
Cont Eliquis, BB

## 2023-01-25 NOTE — Assessment & Plan Note (Addendum)
Cont Imdur, BB Med rec pending: Cont Norvasc and hydralazine if pt still taking these meds

## 2023-01-25 NOTE — H&P (Signed)
History and Physical    Patient: Jake Gross WTU:[id number] DOB: 12/05/1940 DOA: 01/24/2023 DOS: the patient was seen and examined on 01/25/2023 PCP: Eden Emms, NP  Patient coming from: Home  Chief Complaint:  Chief Complaint  Patient presents with   Fall   HPI: Jake Gross is a 82 y.o. male with medical history significant of DM2, HTN, CHF, CKD 4.  Pt in to ED with generalized weakness and falls.  States that he has been doing well since his last discharge until approximately 72 hours ago he started getting lightheaded frequently has had 3 falls in 48 hours.   Is on eliquis for PAF.  Denies fevers chills syncope or shortness of breath.   States he has been having N/V for past ~4 days.  Unable to keep liquids down.   Review of Systems: As mentioned in the history of present illness. All other systems reviewed and are negative. 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   CAD (coronary artery disease) 09/29/2019   Cervical spondylosis    Chronic anticoagulation    Chronic diastolic CHF (congestive heart failure) (HCC) 12/17/2018   CKD (chronic kidney disease) 12/17/2018   Diabetes mellitus    History of GI bleed 09/29/2019   Hyperlipidemia    Hypertension    Iron deficiency anemia due to chronic blood loss    Paroxysmal atrial fibrillation (HCC) 12/17/2018   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 NO ISCHEMIA   COLONOSCOPY W/ POLYPECTOMY     5 polyps   COLONOSCOPY WITH PROPOFOL N/A 08/29/2019   Procedure: COLONOSCOPY WITH  PROPOFOL;  Surgeon: Sherrilyn Rist, MD;  Location: [facility] ENDOSCOPY;  Service: Gastroenterology;  Laterality: N/A;   CORONARY ARTERY BYPASS GRAFT N/A 11/26/2018   Procedure: CORONARY ARTERY BYPASS GRAFTING (CABG), ON PUMP, TIMES FOUR, USING LEFT INTERNAL MAMMARY ARTERY AND ENDOSCOPICALLY HARVESTED LEFT SAPHENOUS VEIN;  Surgeon: Alleen Borne, MD;  Location: [facility] OR;  Service: Open Heart Surgery;  Laterality: N/A;   ESOPHAGOGASTRODUODENOSCOPY (EGD) WITH PROPOFOL N/A 08/29/2019   Procedure: ESOPHAGOGASTRODUODENOSCOPY (EGD) WITH PROPOFOL;  Surgeon: Sherrilyn Rist, MD;  Location: [facility] ENDOSCOPY;  Service: Gastroenterology;  Laterality: N/A;   EUS N/A 03/11/2015   Procedure: LOWER ENDOSCOPIC ULTRASOUND (EUS);  Surgeon: Rachael Fee, MD;  Location: [facility] ENDOSCOPY;  Service: Endoscopy;  Laterality: N/A;   FLEXIBLE SIGMOIDOSCOPY N/A 02/02/2015   Procedure: FLEXIBLE SIGMOIDOSCOPY;  Surgeon: Louis Meckel, MD;  Location: [facility] ENDOSCOPY;  Service: Endoscopy;  Laterality: N/A;  ERBE   HEMOSTASIS CLIP PLACEMENT  08/29/2019   Procedure: HEMOSTASIS CLIP PLACEMENT;  Surgeon: Sherrilyn Rist, MD;  Location: [facility] ENDOSCOPY;  Service: Gastroenterology;;   HOT HEMOSTASIS N/A 08/29/2019   Procedure: HOT HEMOSTASIS (ARGON PLASMA COAGULATION/BICAP);  Surgeon: Sherrilyn Rist, MD;  Location: [facility] ENDOSCOPY;  Service: Gastroenterology;  Laterality: N/A;   LEFT HEART CATH AND CORONARY ANGIOGRAPHY N/A 11/20/2018   Procedure: LEFT HEART CATH AND CORONARY ANGIOGRAPHY;  Surgeon: Kathleene Hazel, MD;  Location: [facility] INVASIVE  CV LAB;  Service: Cardiovascular;  Laterality: N/A;   LOOP RECORDER INSERTION N/A 08/14/2017   Procedure: LOOP RECORDER INSERTION;  Surgeon: Hillis Range, MD;  Location: [facility] INVASIVE CV LAB;  Service: Cardiovascular;  Laterality: N/A;   PARTIAL PROCTECTOMY BY TEM N/A 04/08/2015   Procedure: TEM PARTIAL PROCTECTOMY OF RECTAL MASS;  Surgeon: Karie Soda, MD;  Location: [facility] ORS;  Service: General;   Laterality: N/A;   POLYPECTOMY  08/29/2019   Procedure: POLYPECTOMY;  Surgeon: Sherrilyn Rist, MD;  Location: [facility] ENDOSCOPY;  Service: Gastroenterology;;   TEE WITHOUT CARDIOVERSION N/A 06/25/2017   Procedure: TRANSESOPHAGEAL ECHOCARDIOGRAM (TEE);  Surgeon: Chilton Si, MD;  Location: [facility] ENDOSCOPY;  Service: Cardiovascular;  Laterality: N/A;   TEE WITHOUT CARDIOVERSION N/A 11/26/2018   Procedure: TRANSESOPHAGEAL ECHOCARDIOGRAM (TEE);  Surgeon: Alleen Borne, MD;  Location: [facility] OR;  Service: Open Heart Surgery;  Laterality: N/A;   TONSILLECTOMY AND ADENOIDECTOMY     as child   Social History:  reports that he quit smoking about 28 years ago. His smoking use included cigarettes. He has never used smokeless tobacco. He reports that he does not drink alcohol and does not use drugs.  No Known Allergies  Family History  Problem Relation Age of Onset   Cancer Father        "all over"   Coronary artery disease Brother    Diabetes Brother    Stroke Mother    Diabetes Mother    Cancer Brother    Colon cancer Neg Hx    Esophageal cancer Neg Hx    Rectal cancer Neg Hx    Stomach cancer Neg Hx     Prior to Admission medications   Medication Sig Start Date End Date Taking? Authorizing Provider  apixaban (ELIQUIS) 2.5 MG TABS tablet Take 1 tablet (2.5 mg total) by mouth 2 (two) times daily. 12/27/22   Kathleene Hazel, MD  amLODipine (NORVASC) 5 MG tablet TAKE 1 TABLET (5 MG TOTAL) BY MOUTH DAILY. PLEASE CALL TO SCHEDULE AN OVERDUE APPOINTMENT 12/19/22   Kathleene Hazel, MD  chlorthalidone (HYGROTON) 25 MG tablet Take 25 mg by mouth daily.    [provider]  hydrALAZINE (APRESOLINE) 100 MG tablet Take 1 tablet (100 mg total) by mouth 3 (three) times daily. Please call to schedule an overdue appointment with Dr. Clifton James for refills, [phone], thank you. 1st attempt. 09/19/22   Kathleene Hazel, MD  insulin glargine-yfgn (SEMGLEE) 100 UNIT/ML Pen Inject 55  Units into the skin daily. 11/09/22   Eden Emms, NP  Insulin Pen Needle (BD PEN NEEDLE NANO 2ND GEN) 32G X 4 MM MISC Use with insulin pen as directed 01/19/22   Eden Emms, NP  isosorbide mononitrate (IMDUR) 60 MG 24 hr tablet TAKE 1 TABLET BY MOUTH EVERY DAY (NEED OFFICE VISIT) 11/29/22   Kathleene Hazel, MD  Lancets [facility] ULTRASOFT) lancets Use as instructed 04/27/21   Romero Belling, MD  Metoprolol Tartrate 37.5 MG TABS Take 1 tablet by mouth 2 (two) times daily. 10/03/22   [provider]  rosuvastatin (CRESTOR) 20 MG tablet TAKE 1 TABLET BY MOUTH AT BEDTIME. SCHEDULE PHYSICAL EXAM 11/09/22   Eden Emms, NP  tamsulosin (FLOMAX) 0.4 MG CAPS capsule TAKE 1 CAPSULE BY MOUTH EVERY DAY Patient taking differently: Take 0.4 mg by mouth daily. 12/01/19   Lorre Munroe, NP  torsemide (DEMADEX) 20 MG tablet Take 1 tablet (20 mg total) by mouth 2 (two) times daily. 01/08/23  Kathleene Hazel, MD    Physical Exam: Vitals:   01/25/23 0030 01/25/23 0045 01/25/23 0100 01/25/23 0115  BP: (!) 171/83 (!) 178/87 (!) 167/85 (!) 167/89  Pulse: 81 73 75 80  Resp: Temp:      TempSrc:      SpO2: 94% 95% 97% 95%  Weight:      Height:       Constitutional: NAD, calm, comfortable Respiratory: clear to auscultation bilaterally, no wheezing, no crackles. Normal respiratory effort. No accessory muscle use.  Cardiovascular: Regular rate and rhythm, no murmurs / rubs / gallops. No extremity edema. 2+ pedal pulses. No carotid bruits.  Abdomen: no tenderness, no masses palpated. No hepatosplenomegaly. Bowel sounds positive.  Neurologic: CN 2-12 grossly intact. Sensation intact, DTR normal. Strength 5/5 in all 4.  Psychiatric: Normal judgment and insight. Alert and oriented x 3. Normal mood.   Data Reviewed:        Latest Ref Rng & Units 01/25/2023    1:22 AM 01/24/2023    7:03 PM 11/09/2022    4:33 PM  CBC  WBC 4.0 - 10.5 K/uL  13.9  8.8   Hemoglobin 13.0 -  17.0 g/dL 16.1  09.6  04.5   Hematocrit 39.0 - 52.0 % 43.0  44.9  37.7   Platelets 150 - 400 K/uL  309  183.0       Latest Ref Rng & Units 01/25/2023    1:22 AM 01/24/2023    7:03 PM 11/09/2022    4:33 PM  CMP  Glucose 70 - 99 mg/dL  409  811   BUN 8 - 23 mg/dL  97  60   Creatinine 9.14 - 1.24 mg/dL  7.82  9.56   Sodium 213 - 145 mmol/L 138  139  140   Potassium 3.5 - 5.1 mmol/L 3.9  4.4  4.9   Chloride 98 - 111 mmol/L  103  108   CO2 22 - 32 mmol/L  11  24   Calcium 8.9 - 10.3 mg/dL  8.9  8.5   Total Protein 6.5 - 8.1 g/dL  7.5  6.6   Total Bilirubin 0.3 - 1.2 mg/dL  2.0  0.3   Alkaline Phos 38 - 126 U/L  90  82   AST 15 - 41 U/L  31  17   ALT 0 - 44 U/L  11  11    Urinalysis    Component Value Date/Time   COLORURINE YELLOW 01/24/2023 2220   APPEARANCEUR CLEAR 01/24/2023 2220   LABSPEC 1.010 01/24/2023 2220   PHURINE 5.0 01/24/2023 2220   GLUCOSEU 50 (A) 01/24/2023 2220   HGBUR NEGATIVE 01/24/2023 2220   BILIRUBINUR NEGATIVE 01/24/2023 2220   KETONESUR NEGATIVE 01/24/2023 2220   PROTEINUR 100 (A) 01/24/2023 2220   UROBILINOGEN 0.2 04/11/2015 1658   NITRITE NEGATIVE 01/24/2023 2220   LEUKOCYTESUR NEGATIVE 01/24/2023 2220   Renal US: IMPRESSION: Increased echogenicity consistent with medical renal disease. No other focal abnormality is noted.  CXR: neg  CT head = no acute findings   Assessment and Plan: * Acute renal failure 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 Pt with AKI on CKD4, looks very pre-renal with BUN:Cr ratio > 20:1.  Probably secondary to combination of N/V and poor PO intake for past 4 days + ongoing use of diuretics for his CHF. Hold diuretics IVF: LR 1L bolus then 125 cc/hr Strict intake and output Repeat BMP in AM Initially was concerned he  might have RTA based on bicarb of 11; however, VBG now showing bicarb of 21. See what bicarb is on the repeat BMP in AM If not rapidly improving then will need renal imaging and nephro consult in  AM.  Chronic diastolic CHF (congestive heart failure) Not overloaded currently (dry with pre-renal azotemia). IVF as above Hold diuretics Watch for volume overload  Paroxysmal atrial fibrillation Cont Eliquis, BB  Diabetes mellitus type 2, insulin dependent Takes glargine 55u in AM at home. Due to AKI and persistent N/V: will put on 30u daily here And Q4H SSI sensitive scale.  Essential hypertension Cont Imdur, BB Med rec pending: Cont Norvasc and hydralazine if pt still taking these meds      Advance Care Planning:   Code Status: Full Code  Consults: None  Family Communication: No family in room  Severity of Illness: The appropriate patient status for this patient is OBSERVATION. Observation status is judged to be reasonable and necessary in order to provide the required intensity of service to ensure the patient's safety. The patient's presenting symptoms, physical exam findings, and initial radiographic and laboratory data in the context of their medical condition is felt to place them at decreased risk for further clinical deterioration. Furthermore, it is anticipated that the patient will be medically stable for discharge from the hospital within 2 midnights of admission.   Author: Hillary BowGARDNER, Caydin Yeatts M., DO 01/25/2023 2:20 AM  For on call review [email or web address].

## 2023-01-25 NOTE — Assessment & Plan Note (Signed)
Not overloaded currently (dry with pre-renal azotemia). IVF as above Hold diuretics Watch for volume overload

## 2023-01-25 NOTE — Assessment & Plan Note (Signed)
Pt with AKI on CKD4, looks very pre-renal with BUN:Cr ratio > 20:1.  Probably secondary to combination of N/V and poor PO intake for past 4 days + ongoing use of diuretics for his CHF. Hold diuretics IVF: LR 1L bolus then 125 cc/hr Strict intake and output Repeat BMP in AM Initially was concerned he might have RTA based on bicarb of 11; however, VBG now showing bicarb of 21. See what bicarb is on the repeat BMP in AM If not rapidly improving then will need renal imaging and nephro consult in AM.

## 2023-01-26 DIAGNOSIS — N179 Acute kidney failure, unspecified: Secondary | ICD-10-CM | POA: Diagnosis not present

## 2023-01-26 DIAGNOSIS — N184 Chronic kidney disease, stage 4 (severe): Secondary | ICD-10-CM | POA: Diagnosis not present

## 2023-01-26 DIAGNOSIS — I5032 Chronic diastolic (congestive) heart failure: Secondary | ICD-10-CM | POA: Diagnosis not present

## 2023-01-26 DIAGNOSIS — I480 Paroxysmal atrial fibrillation: Secondary | ICD-10-CM | POA: Diagnosis not present

## 2023-01-26 LAB — COMPREHENSIVE METABOLIC PANEL
ALT: 12 U/L (ref 0–44)
AST: 22 U/L (ref 15–41)
Albumin: 2.8 g/dL — ABNORMAL LOW (ref 3.5–5.0)
Alkaline Phosphatase: 73 U/L (ref 38–126)
Anion gap: 9 (ref 5–15)
BUN: 85 mg/dL — ABNORMAL HIGH (ref 8–23)
CO2: 24 mmol/L (ref 22–32)
Calcium: 8.3 mg/dL — ABNORMAL LOW (ref 8.9–10.3)
Chloride: 104 mmol/L (ref 98–111)
Creatinine, Ser: 2.71 mg/dL — ABNORMAL HIGH (ref 0.61–1.24)
GFR, Estimated: 23 mL/min — ABNORMAL LOW (ref >60)
Glucose, Bld: 97 mg/dL (ref 70–99)
Potassium: 3.6 mmol/L (ref 3.5–5.1)
Sodium: 137 mmol/L (ref 135–145)
Total Bilirubin: 0.9 mg/dL (ref 0.3–1.2)
Total Protein: 6.2 g/dL — ABNORMAL LOW (ref 6.5–8.1)

## 2023-01-26 LAB — CBC
HCT: 34.4 % — ABNORMAL LOW (ref 39.0–52.0)
Hemoglobin: 11.5 g/dL — ABNORMAL LOW (ref 13.0–17.0)
MCH: 27.3 pg (ref 26.0–34.0)
MCHC: 33.4 g/dL (ref 30.0–36.0)
MCV: 81.7 fL (ref 80.0–100.0)
Platelets: 191 K/uL (ref 150–400)
RBC: 4.21 MIL/uL — ABNORMAL LOW (ref 4.22–5.81)
RDW: 14.3 % (ref 11.5–15.5)
WBC: 10.9 K/uL — ABNORMAL HIGH (ref 4.0–10.5)
nRBC: 0 % (ref 0.0–0.2)

## 2023-01-26 LAB — GLUCOSE, CAPILLARY
Glucose-Capillary: 80 mg/dL (ref 70–99)
Glucose-Capillary: 108 mg/dL — ABNORMAL HIGH (ref 70–99)
Glucose-Capillary: 134 mg/dL — ABNORMAL HIGH (ref 70–99)
Glucose-Capillary: 169 mg/dL — ABNORMAL HIGH (ref 70–99)

## 2023-01-26 MED ORDER — LACTATED RINGERS IV SOLN: INTRAVENOUS | Status: DC

## 2023-01-26 NOTE — Evaluation (Signed)
Occupational Therapy Evaluation Patient Details Name: Jake Gross MRN: [id number] DOB: 07-24-1941 Today's Date: 01/26/2023   History of Present Illness Pt is an 82 yo male presenting to [facility] ED on 4/10 via EMS due to fall. He has had nausea/vomiting for 4 months and has had progressive weakness over the past 3 days. 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  PMH: DM, HTN, colon cancer, CVA, HLD, CHF, renal disease.   Clinical Impression   PTA, pt lived alone and was independent. Upon eval, pt presents with decreased safety awareness and balance affecting safe performance of ADL. Pt performing UB ADL with set-up and LB Adl with supervision, however, pt with balance deficits evident with gathering items needed for dressing and requiring min guard; one LOB with min A to correct. Min A for tub shower transfer; pt reporting he normally does not support self on anything; educated re: compensatory techniques. Recommending continued OT in natural setting.      Recommendations for follow up therapy are one component of a multi-disciplinary discharge planning process, led by the attending physician.  Recommendations may be updated based on patient status, additional functional criteria and insurance authorization.   Assistance Recommended at Discharge Intermittent Supervision/Assistance  Patient can return home with the following A little help with bathing/dressing/bathroom;Assist for transportation;Help with stairs or ramp for entrance;Assistance with cooking/housework    Functional Status Assessment  Patient has had a recent decline in their functional status and demonstrates the ability to make significant improvements in function in a reasonable and predictable amount of time.  Equipment Recommendations  BSC/3in1    Recommendations for Other Services       Precautions / Restrictions Precautions Precautions: None Restrictions Weight Bearing Restrictions: No      Mobility Bed Mobility                General bed mobility comments: receined in recliner and in recliner on departure    Transfers Overall transfer level: Modified independent Equipment used: Rolling walker (2 wheels)               General transfer comment: Wide BOS      Balance Overall balance assessment: Mild deficits observed, not formally tested                                         ADL either performed or assessed with clinical judgement   ADL Overall ADL's : Needs assistance/impaired Eating/Feeding: Modified independent;Sitting   Grooming: Supervision/safety;Standing   Upper Body Bathing: Set up;Sitting   Lower Body Bathing: Supervison/ safety;Sit to/from stand   Upper Body Dressing : Set up;Sitting   Lower Body Dressing: Sit to/from stand;Supervision/safety Lower Body Dressing Details (indicate cue type and reason): for safety Toilet Transfer: Min guard;Rolling walker (2 wheels);Ambulation;BSC/3in1 Toilet Transfer Details (indicate cue type and reason): Carrying clothing into restroom to get dressed. Poor balance and safety awareness. Toileting- Clothing Manipulation and Hygiene: Set up;Sitting/lateral lean   Tub/ Shower Transfer: Tub transfer;Minimal assistance;Ambulation;Rolling walker (2 wheels) Tub/Shower Transfer Details (indicate cue type and reason): Could benefit from seat Functional mobility during ADLs: Min guard;Rolling walker (2 wheels)       Vision Baseline Vision/History: 0 No visual deficits Ability to See in Adequate Light: 0 Adequate Patient Visual Report: No change from baseline Vision Assessment?: No apparent visual deficits     Perception     Praxis  Pertinent Vitals/Pain Pain Assessment Pain Assessment: No/denies pain     Hand Dominance Right   Extremity/Trunk Assessment Upper Extremity Assessment Upper Extremity Assessment: Overall WFL for tasks assessed   Lower Extremity Assessment Lower Extremity Assessment: Defer to  PT evaluation   Cervical / Trunk Assessment Cervical / Trunk Assessment: Normal   Communication Communication Communication: No difficulties   Cognition Arousal/Alertness: Awake/alert Behavior During Therapy: WFL for tasks assessed/performed Overall Cognitive Status: Within Functional Limits for tasks assessed                                 General Comments: slight decrease in safety awareness and memory, but pt reports his memory has been like this for a while. Able to perform simple money management     General Comments  just returned home from rehab and does not want to go back    Exercises     Shoulder Instructions      Home Living Family/patient expects to be discharged to:: Private residence Living Arrangements: Alone Available Help at Discharge: Family;Available PRN/intermittently (son and daughter) Type of Home: House Home Access: Stairs to enter [employer] of Steps: 5 Entrance Stairs-Rails: Right;Left Home Layout: One level     Bathroom Shower/Tub: Chief Strategy Officer: Standard Bathroom Accessibility: Yes   Home Equipment: Cane - single point;Cane - Programmer, applications (2 wheels)   Additional Comments: Pt reports the cane they made themself      Prior Functioning/Environment Prior Level of Function : Independent/Modified Independent;Driving;History of Falls (last six months)             Mobility Comments: Pt was ambulating with RW. He states that he uses it when he goes out in the community and in his home. 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 He had 4 falls in one day which is why he is here but otherwise denies falls. ADLs Comments: independent        OT Problem List: Decreased activity tolerance;Impaired balance (sitting and/or standing);Decreased safety awareness      OT Treatment/Interventions: Self-care/ADL training;Therapeutic exercise;DME and/or AE instruction;Balance  training;Patient/family education;Therapeutic activities    OT Goals(Current goals can be found in the care plan section) Acute Rehab OT Goals Patient Stated Goal: go home OT Goal Formulation: With patient Time For Goal Achievement: 02/09/23 Potential to Achieve Goals: Good  OT Frequency: Min 2X/week    Co-evaluation              AM-PAC OT "6 Clicks" Daily Activity     Outcome Measure Help from another person eating meals?: None Help from another person taking care of personal grooming?: A Little Help from another person toileting, which includes using toliet, bedpan, or urinal?: A Little Help from another person bathing (including washing, rinsing, drying)?: A Little Help from another person to put on and taking off regular upper body clothing?: A Little Help from another person to put on and taking off regular lower body clothing?: A Little 6 Click Score: 19   End of Session Equipment Utilized During Treatment: Gait belt;Rolling walker (2 wheels) Nurse Communication: Mobility status  Activity Tolerance: Patient tolerated treatment well Patient left: in chair;with call bell/phone within reach;with chair alarm set  OT Visit Diagnosis: Unsteadiness on feet (R26.81);Muscle weakness (generalized) (M62.81);Other abnormalities of gait and mobility (R26.89)                Time: 3500-9381  OT Time Calculation (min): 21 min Charges:  OT General Charges $OT Visit: 1 Visit OT Evaluation $OT Eval Low Complexity: 1 Low  Tyler Deis, OTR/L [facility] Acute Rehabilitation Office: [phone]   Myrla Halsted 01/26/2023, 1:52 PM

## 2023-01-26 NOTE — Discharge Summary (Signed)
Physician Discharge Summary   Patient: Jake Gross MRN: [id number] DOB: 11/04/1940  Admit date:     01/24/2023  Discharge date: 01/26/23  Discharge Physician: Meredeth Ide   PCP: Eden Emms, NP   Recommendations at discharge:   Follow-up PCP in 2 weeks Patient to home with home health PT  Discharge Diagnoses: Principal Problem:   Acute renal failure 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 Active Problems:   Chronic diastolic CHF (congestive heart failure)   Paroxysmal atrial fibrillation   Diabetes mellitus type 2, insulin dependent   History of CVA (cerebrovascular accident)   CAD (coronary artery disease)   Essential hypertension   AKI (acute kidney injury)  Resolved Problems:   AKI (acute kidney injury)  Hospital Course:  82 year old male with medical history of diabetes mellitus type 2, hypertension, CHF, CKD stage IV presents to ED with generalized weakness and falls.  Patient stated that since last discharge 72 hours ago he has been getting lightheaded frequently with 3 falls in 48 hours.  Patient is on Eliquis for paroxysmal atrial fibrillation.  Also has been having nausea vomiting for past 4 days.  Patient found to be in acute kidney injury on CKD stage IV.    Assessment and Plan:  Acute kidney injury on CKD stage IV -Started on LR at 125 mill per hour,  IV fluid was changed to 100 mill per hour  -BUN/creatinine has improved to 85/2.71 -Will discharge home, resume taking diuretics as before   Nausea and vomiting -Resolved -Likely will gastroenteritis    Diabetes mellitus type 2 -Continue home regimen   Paroxysmal atrial fibrillation -Continue Eliquis, metoprolol   Hypertension -Continue metoprolol         Consultants:  Procedures performed:  Disposition: Home Diet recommendation:  Discharge Diet Orders (From admission, onward)     Start     Ordered   01/26/23 0000  Diet - low sodium heart healthy        01/26/23 1122            Carb modified diet DISCHARGE MEDICATION: Allergies as of 01/26/2023   No Known Allergies      Medication List     TAKE these medications    amLODipine 5 MG tablet Commonly known as: NORVASC TAKE 1 TABLET (5 MG TOTAL) BY MOUTH DAILY. PLEASE CALL TO SCHEDULE AN OVERDUE APPOINTMENT   apixaban 2.5 MG Tabs tablet Commonly known as: ELIQUIS Take 1 tablet (2.5 mg total) by mouth 2 (two) times daily.   BD Pen Needle Nano 2nd Gen 32G X 4 MM Misc Generic drug: Insulin Pen Needle Use with insulin pen as directed   chlorthalidone 25 MG tablet Commonly known as: HYGROTON Take 25 mg by mouth daily.   Farxiga 10 MG Tabs tablet Generic drug: dapagliflozin propanediol Take 10 mg by mouth daily.   hydrALAZINE 100 MG tablet Commonly known as: APRESOLINE Take 1 tablet (100 mg total) by mouth 3 (three) times daily. Please call to schedule an overdue appointment with Dr. Clifton James for refills, [phone], thank you. 1st attempt.   insulin glargine-yfgn 100 UNIT/ML Pen Commonly known as: SEMGLEE Inject 55 Units into the skin daily.   isosorbide mononitrate 60 MG 24 hr tablet Commonly known as: IMDUR TAKE 1 TABLET BY MOUTH EVERY DAY (NEED OFFICE VISIT)   metoprolol tartrate 25 MG tablet Commonly known as: LOPRESSOR Take 37.5 mg by mouth 2 (two) times daily. What changed: Another medication with the same name was removed. Continue  taking this medication, and follow the directions you see here.   onetouch ultrasoft lancets Use as instructed   rosuvastatin 20 MG tablet Commonly known as: CRESTOR TAKE 1 TABLET BY MOUTH AT BEDTIME. SCHEDULE PHYSICAL EXAM   tamsulosin 0.4 MG Caps capsule Commonly known as: FLOMAX TAKE 1 CAPSULE BY MOUTH EVERY DAY   torsemide 20 MG tablet Commonly known as: DEMADEX Take 1 tablet (20 mg total) by mouth 2 (two) times daily.        Discharge Exam: Filed Weights   01/24/23 1839  Weight: 86.2 kg   General-appears in no acute  distress Heart-S1-S2, regular, no murmur auscultated Lungs-clear to auscultation bilaterally, no wheezing or crackles auscultated Abdomen-soft, nontender, no organomegaly Extremities-no edema in the lower extremities Neuro-alert, oriented x3, no focal deficit noted  Condition at discharge: good  The results of significant diagnostics from this hospitalization (including imaging, microbiology, ancillary and laboratory) are listed below for reference.   Imaging Studies: US RENAL  Result Date: 01/25/2023 CLINICAL DATA:  Acute renal injury EXAM: RENAL / URINARY TRACT ULTRASOUND COMPLETE COMPARISON:  12/01/2019, 09/27/2022 FINDINGS: Right Kidney: Renal measurements: 10.8 x 6.0 x 5.3 cm. = volume: 178 mL. Mild increased echogenicity is noted. No mass or hydronephrosis is noted. Left Kidney: Renal measurements: 11.5 x 5.9 x 5.6 cm. = volume: 198 mL. Mild increased echogenicity is noted. No mass lesion or hydronephrosis is noted. Bladder: Appears normal for degree of bladder distention. Other: None. IMPRESSION: Increased echogenicity consistent with medical renal disease. No other focal abnormality is noted. Electronically Signed   By: Alcide Clever M.D.   On: 01/25/2023 00:03   DG Chest Portable 1 View  Result Date: 01/24/2023 CLINICAL DATA:  Shortness of breath EXAM: PORTABLE CHEST 1 VIEW COMPARISON:  09/27/2022 FINDINGS: Cardiac size is within normal limits. 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 There are no signs of pulmonary edema or focal pulmonary consolidation. There is poor inspiration. There is no pleural effusion or pneumothorax. There is a implanted cardiac monitoring device in left chest wall. IMPRESSION: There are no new infiltrates or signs of pulmonary edema. Electronically Signed   By: Ernie Avena M.D.   On: 01/24/2023 20:32   CT HEAD WO CONTRAST ( )  Result Date: 01/24/2023 CLINICAL DATA:  Recent fall with headaches, initial encounter EXAM: CT HEAD WITHOUT CONTRAST  TECHNIQUE: Contiguous axial images were obtained from the base of the skull through the vertex without intravenous contrast. RADIATION DOSE REDUCTION: This exam was performed according to the departmental dose-optimization program which includes automated exposure control, adjustment of the mA and/or kV according to patient size and/or use of iterative reconstruction technique. COMPARISON:  09/27/2022 FINDINGS: Brain: No evidence of acute infarction, hemorrhage, hydrocephalus, extra-axial collection or mass lesion/mass effect. 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 Generalized atrophic changes are noted. Mild chronic white matter ischemic changes are seen. Vascular: No hyperdense vessel or unexpected calcification. Skull: Normal. Negative for fracture or focal lesion. Sinuses/Orbits: No acute finding. Other: None. IMPRESSION: Chronic atrophic and ischemic changes without acute abnormality. Electronically Signed   By: Alcide Clever M.D.   On: 01/24/2023 19:31    Microbiology: Results for orders placed or performed during the hospital encounter of 08/27/19  SARS CORONAVIRUS 2 (TAT 6-24 HRS) Nasopharyngeal Nasopharyngeal Swab     Status: None   Collection Time: 08/27/19  3:25 PM   Specimen: Nasopharyngeal Swab  Result Value Ref Range Status   SARS Coronavirus 2 NEGATIVE NEGATIVE Final    Comment: (  NOTE) SARS-CoV-2 target nucleic acids are NOT DETECTED. The SARS-CoV-2 RNA is generally detectable in upper and lower respiratory specimens during the acute phase of infection. Negative results do not preclude SARS-CoV-2 infection, do not rule out co-infections with other pathogens, and should not be used as the sole basis for treatment or other patient management decisions. Negative results must be combined with clinical observations, patient history, and epidemiological information. The expected result is Negative. Fact Sheet for  Patients: HairSlick.no Fact Sheet for Healthcare Providers: quierodirigir.com This test is not yet approved or cleared by the [place] FDA and  has been authorized for detection and/or diagnosis of SARS-CoV-2 by FDA under an Emergency Use Authorization (EUA). This EUA will remain  in effect (meaning this test can be used) for the duration of the COVID-19 declaration under Section 56 4(b)(1) of the Act, 21 U.S.C. section 360bbb-3(b)(1), unless the authorization is terminated or revoked sooner. Performed at [facility], [address], [place], [place] [address]     Labs: CBC: Recent Labs  Lab 01/24/23 1903 01/25/23 0122 01/25/23 0417 01/26/23 0044  WBC 13.9*  --  12.8* 10.9*  NEUTROABS 11.7*  --   --   --   HGB 14.3 14.6 13.4 11.5*  HCT 44.9 43.0 39.5 34.4*  MCV 85.0  --  80.3 81.7  PLT 309  --  266 191   Basic Metabolic Panel: Recent Labs  Lab 01/24/23 1903 01/25/23 0122 01/25/23 0417 01/26/23 0044  NA 139 138 136 137  K 4.4 3.9 3.7 3.6  CL 103  --  102 104  CO2 11*  --  21* 24  GLUCOSE 107*  --  94 97  BUN 97*  --  95* 85*  CREATININE 3.39*  --  3.28* 2.71*  CALCIUM 8.9  --  8.7* 8.3*   Liver Function Tests: Recent Labs  Lab 01/24/23 1903 01/26/23 0044  AST 31 22  ALT 11 12  ALKPHOS 90 73  BILITOT 2.0* 0.9  PROT 7.5 6.2*  ALBUMIN 3.5 2.8*   CBG: Recent Labs  Lab 01/25/23 1729 01/25/23 2047 01/25/23 2358 01/26/23 0326 01/26/23 0847  GLUCAP 156* 127* 102* 80 108*    Discharge time spent: greater than 30 minutes.  Signed: Meredeth Ide, MD [facility] 01/26/2023

## 2023-01-26 NOTE — Evaluation (Signed)
Physical Therapy Evaluation Patient Details Name: Jake Gross MRN: [id number] DOB: 01/19/1941 Today's Date: 01/26/2023  History of Present Illness  Pt is 82 yo male presenting to [facility] ED on 4/10 via EMS due to fall. He has had nausea/vomiting for 4 months and has had progressive weakness over the past 3 days. 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  PMH: DM, HTN, colon cancer, CVA, HLD, CHF, renal disease.  Clinical Impression  Pt is presenting slightly below baseline. Currently he is supervision for long distance gait and CGA for descending stairs. Pt requires occasional verbal cues for safety with AD in order to decrease risk for falls though no overt LOB. Due to pt current functional status, home set up and available assistance at home recommending skilled physical therapy services at a lower frequency of 3x/week on discharge from acute care hospital setting in order to work on safety, strength, balance and functional mobility in order to decrease risk for falls, injury and re-hospitalization. Pt demonstrates no signs/symptoms of cardiac/respiratory distress throughout session.         Recommendations for follow up therapy are one component of a multi-disciplinary discharge planning process, led by the attending physician.  Recommendations may be updated based on patient status, additional functional criteria and insurance authorization.  Follow Up Recommendations       Assistance Recommended at Discharge PRN  Patient can return home with the following  Help with stairs or ramp for entrance;Assist for transportation    Equipment Recommendations None recommended by PT     Functional Status Assessment Patient has had a recent decline in their functional status and demonstrates the ability to make significant improvements in function in a reasonable and predictable amount of time.     Precautions / Restrictions Precautions Precautions: None Restrictions Weight Bearing Restrictions: No       Mobility  Bed Mobility     General bed mobility comments: Pt was recieved sitting EOB and returned to recliner. Per CNA pt can get in and out of bed without assist but goes quickly. Patient Response: Cooperative  Transfers Overall transfer level: Modified independent Equipment used: Rolling walker (2 wheels)     General transfer comment: Slight increase in BOS, requires AD for stability. Discussed bringing walker with him when he goes to sit. No overt LOB or close calls but when at home pt was educated that most falls happen when going to sit or stand from toilet, bed, chair etc.    Ambulation/Gait Ambulation/Gait assistance: Supervision Gait Distance (Feet): 250 Feet Assistive device: Rolling walker (2 wheels) Gait Pattern/deviations: Step-through pattern, Decreased stride length Gait velocity: Slight decrease in cadence. Gait velocity interpretation: 1.31 - 2.62 ft/sec, indicative of limited community ambulator   General Gait Details: Intermittent verbal cues to stay close to the AD especially during turns and transitions through doorways in order to keep body habitus close to AD. No overt LOB but increased risk for falls with AD significantly anterior to COM.  Stairs Stairs: Yes Stairs assistance: Min guard Stair Management: One rail Left, Step to pattern Number of Stairs: 5 General stair comments: Pt was able to navigate stairs at [facility] for safety on descending but no LOB. Slight tremor at the RLE due to weakness/fatigue.       Balance Overall balance assessment: Mild deficits observed, not formally tested         Pertinent Vitals/Pain Pain Assessment Pain Assessment: No/denies pain    Home Living Family/patient expects to be discharged  to:: Private residence Living Arrangements: Alone Available Help at Discharge: Family;Available PRN/intermittently (son and daughter) Type of Home: House Home Access: Stairs to enter Entrance Stairs-Rails:  Doctor, general practice of Steps: 5   Home Layout: One level Home Equipment: Cane - single point;Cane - Programmer, applications (2 wheels)      Prior Function Prior Level of Function : Independent/Modified Independent;Driving;History of Falls (last six months)             Mobility Comments: Pt was ambulating with RW. He states that he uses it when he goes out in the community and in his home. 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 He had 4 falls in one day which is why he is here but otherwise denies falls. ADLs Comments: independent     Hand Dominance   Dominant Hand: Right    Extremity/Trunk Assessment   Upper Extremity Assessment Upper Extremity Assessment: Overall WFL for tasks assessed    Lower Extremity Assessment Lower Extremity Assessment: Overall WFL for tasks assessed    Cervical / Trunk Assessment Cervical / Trunk Assessment: Normal  Communication   Communication: No difficulties  Cognition Arousal/Alertness: Awake/alert Behavior During Therapy: WFL for tasks assessed/performed Overall Cognitive Status: Within Functional Limits for tasks assessed       General Comments: Slight decrease in safety awareness.        General Comments General comments (skin integrity, edema, etc.): Pt just returned not too long ago from 7 months of rehab. He states he was independent when he returned from rehab        Assessment/Plan    PT Assessment Patient needs continued PT services  PT Problem List Decreased strength;Decreased mobility;Decreased balance;Decreased activity tolerance       PT Treatment Interventions DME instruction;Therapeutic activities;Gait training;Therapeutic exercise;Patient/family education;Stair training;Balance training;Functional mobility training    PT Goals (Current goals can be found in the Care Plan section)  Acute Rehab PT Goals Patient Stated Goal: To go home and continue with HHPT PT Goal  Formulation: With patient Time For Goal Achievement: 02/09/23 Potential to Achieve Goals: Fair    Frequency Min 2X/week        AM-PAC PT "6 Clicks" Mobility  Outcome Measure Help needed turning from your back to your side while in a flat bed without using bedrails?: None Help needed moving from lying on your back to sitting on the side of a flat bed without using bedrails?: None Help needed moving to and from a bed to a chair (including a wheelchair)?: None Help needed standing up from a chair using your arms (e.g., wheelchair or bedside chair)?: None Help needed to walk in hospital room?: A Little Help needed climbing 3-5 steps with a railing? : A Little 6 Click Score: 22    End of Session Equipment Utilized During Treatment: Gait belt Activity Tolerance: Patient tolerated treatment well Patient left: in chair;with chair alarm set;with call bell/phone within reach Nurse Communication: Mobility status PT Visit Diagnosis: Unsteadiness on feet (R26.81)    Time: 9417-4081 PT Time Calculation (min) (ACUTE ONLY): 24 min   Charges:   PT Evaluation $PT Eval Low Complexity: 1 Low PT Treatments $Therapeutic Activity: 8-22 mins        Harrel Carina, DPT, CLT  Acute Rehabilitation Services Office: [phone] (Secure chat preferred)   Claudia Desanctis 01/26/2023, 10:37 AM

## 2023-01-26 NOTE — Plan of Care (Signed)

## 2023-01-26 NOTE — TOC Initial Note (Signed)
Transition of Care [facility]) - Initial/Assessment Note    Patient Details  Name: Jake Gross MRN: [id number] Date of Birth: 02/23/1941  [facility]) CM/SW Contact:    Kingsley Plan, RN Phone Number: 01/26/2023, 12:10 PM  Clinical Narrative:                  Spoke to patient at bedside. Patient confirmed face sheet information. Discussed PT recommendations for [facility]. Patient in agreement and already has home health services with [facility] and wants to continue.   NCM confirmed with Amy with [facility]. Amy can accept patient back and aware patient discharging today.   Patient already has walker and cane at home. Daughter will provide transportation home.    Expected Discharge Plan: Home w Home Health Services Barriers to Discharge: No Barriers Identified   Patient Goals and CMS Choice Patient states their goals for this hospitalization and ongoing recovery are:: to return to home CMS Medicare.gov Compare Post Acute Care list provided to:: Patient Choice offered to / list presented to : Patient [place] ownership interest in [facility].provided to:: Patient    Expected Discharge Plan and Services   Discharge Planning Services: CM Consult Post Acute Care Choice: Home Health Living arrangements for the past 2 months: Single Family Home Expected Discharge Date: 01/26/23               DME Arranged: N/A DME Agency: NA       HH Arranged: PT HH Agency: [facility] Date HH Agency Contacted: 01/26/23 Time HH Agency Contacted: 1210 Representative spoke with at [facility] Agency: Amy  Prior Living Arrangements/Services Living arrangements for the past 2 months: Single Family Home Lives with:: Self Patient language and need for interpreter reviewed:: Yes Do you feel safe going back to the place where you live?: Yes      Need for Family Participation in Patient Care: Yes (Comment) Care giver support system in place?: Yes (comment) Current home services:  DME Criminal Activity/Legal Involvement Pertinent to Current Situation/Hospitalization: No - Comment as needed  Activities of Daily Living Home Assistive Devices/Equipment: Dan Humphreys (specify type) ADL Screening (condition at time of admission) Patient's cognitive ability adequate to safely complete daily activities?: Yes Is the patient deaf or have difficulty hearing?: No Does the patient have difficulty seeing, even when wearing glasses/contacts?: No Does the patient have difficulty concentrating, remembering, or making decisions?: Yes Patient able to express need for assistance with ADLs?: Yes Does the patient have difficulty dressing or bathing?: No Independently performs ADLs?: Yes (appropriate for developmental age) Does the patient have difficulty walking or climbing stairs?: Yes Weakness of Legs: Both Weakness of Arms/Hands: None  Permission Sought/Granted   Permission granted to share information with : No              Emotional Assessment Appearance:: Appears stated age Attitude/Demeanor/Rapport: Engaged Affect (typically observed): Accepting Orientation: : Oriented to Self, Oriented to Place, Oriented to  Time, Oriented to Situation Alcohol / Substance Use: Not Applicable Psych Involvement: No (comment)  Admission diagnosis:  Azotemia [R79.89] AKI (acute kidney injury) [N17.9] Fall, initial encounter [W19.XXXA] Patient Active Problem List   Diagnosis Date Noted   AKI (acute kidney injury) 01/25/2023   Acute renal failure 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 09/28/2022   Elevated troponin 09/28/2022   Lactic acidosis 09/28/2022   Prolonged QT interval 09/28/2022   Medicare annual wellness visit, subsequent 10/29/2021   Abnormal SPEP 09/12/2021   Dyspnea 06/27/2021  Abscess 06/27/2021   History of anemia 06/27/2021   Nocturia 06/27/2021   Benign hypertensive kidney disease with chronic kidney disease 12/22/2019   Proteinuria 12/22/2019   Stage 3b  chronic kidney disease 12/22/2019   Type 2 diabetes mellitus with diabetic chronic kidney disease 12/22/2019   Hypokalemia 12/22/2019   History of GI bleed 09/29/2019   CAD (coronary artery disease) 09/29/2019   AVM (arteriovenous malformation) of small bowel, 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   Iron deficiency anemia due to chronic blood loss    Chronic anticoagulation    History of CVA (cerebrovascular accident) 08/25/2019   Chronic diastolic CHF (congestive heart failure) 12/17/2018   Paroxysmal atrial fibrillation 12/17/2018   CKD (chronic kidney disease) 12/17/2018   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 [place] [address] Phone: [phone] Fax: [phone]     Social Determinants of Health (SDOH) Social History: SDOH Screenings   Food Insecurity: No Food Insecurity (12/05/2022)  Housing: Low Risk  (12/05/2022)  Transportation Needs: No Transportation Needs (12/05/2022)  Utilities: Not At Risk (12/05/2022)  Alcohol Screen: Low Risk  (12/05/2022)  Depression (PHQ2-9): Low Risk  (12/05/2022)  Financial Resource Strain: Low Risk  (12/05/2022)  Physical Activity: Inactive (12/05/2022)  Social Connections: Socially Isolated (12/05/2022)  Stress: No Stress Concern Present (12/05/2022)  Tobacco Use: Medium Risk (12/05/2022)   SDOH Interventions:     Readmission Risk Interventions     No data to display

## 2023-01-29 ENCOUNTER — Telehealth: Payer: Self-pay | Admitting: Pharmacist

## 2023-01-29 ENCOUNTER — Telehealth: Payer: Self-pay | Admitting: *Deleted

## 2023-01-29 DIAGNOSIS — I5032 Chronic diastolic (congestive) heart failure: Secondary | ICD-10-CM

## 2023-01-29 DIAGNOSIS — E119 Type 2 diabetes mellitus without complications: Secondary | ICD-10-CM

## 2023-01-29 DIAGNOSIS — I10 Essential (primary) hypertension: Secondary | ICD-10-CM

## 2023-01-29 NOTE — Transitions of Care (Post Inpatient/ED Visit) (Signed)
   01/29/2023  Name: Jake Gross MRN: [id number] DOB: 1941/03/05  Today's TOC FU Call Status: Today's TOC FU Call Status:: Unsuccessul Call (1st Attempt) Unsuccessful Call (1st Attempt) Date: 01/29/23  Attempted to reach the patient regarding the most recent Inpatient/ED visit.  Follow Up Plan: Additional outreach attempts will be made to reach the patient to complete the Transitions of Care (Post Inpatient/ED visit) call.   Gean Maidens BSN RN [facility] [phone]

## 2023-01-29 NOTE — Telephone Encounter (Signed)
PharmD reviewed patient chart to assess eligibility for [facility] services. Patient was determined to be a good candidate for the program given the complexity of the medication regimen and overall risk for hospitalization and/or high healthcare utilization.   Referral entered in order to outreach patient and offer appointment with PharmD. Referral cosigned to PCP.  

## 2023-01-30 ENCOUNTER — Telehealth: Payer: Self-pay | Admitting: *Deleted

## 2023-01-30 NOTE — Transitions of Care (Post Inpatient/ED Visit) (Signed)
   01/30/2023  Name: Jake Gross MRN: [id number] DOB: 1940/11/05  Today's TOC FU Call Status: Today's TOC FU Call Status:: Successful TOC FU Call Competed TOC FU Call Complete Date: 01/30/23  Transition Care Management Follow-up Telephone Call Date of Discharge: 01/28/23 Discharge Facility: [facility]) Type of Discharge: Inpatient Admission Primary Inpatient Discharge Diagnosis:: Fall/ Azotemia How have you been since you were released from the hospital?: Same Any questions or concerns?: No  Items Reviewed: Did you receive and understand the discharge instructions provided?: Yes Medications obtained and verified?: Yes (Medications Reviewed) Any new allergies since your discharge?: No Dietary orders reviewed?: No Do you have support at home?: Yes People in Home: alone Name of Support/Comfort Primary Source: Daughter Wilkie Aye and Son Shawn  Home Care and Equipment/Supplies: Were Home Health Services Ordered?: Yes Name of Home Health Agency:: [facility] Has Agency set up a time to come to your home?: Yes First Home Health Visit Date: 01/29/23 Any new equipment or medical supplies ordered?: No  Functional Questionnaire: Do you need assistance with bathing/showering or dressing?: Yes Do you need assistance with meal preparation?: Yes Do you need assistance with eating?: No Do you have difficulty maintaining continence: No Do you need assistance with getting out of bed/getting out of a chair/moving?: No Do you have difficulty managing or taking your medications?: No  Follow up appointments reviewed: PCP Follow-up appointment confirmed?: Yes Date of PCP follow-up appointment?: 02/09/23 Follow-up Provider: Mordecai Maes Specialist [facility] Follow-up appointment confirmed?: NA Do you need transportation to your follow-up appointment?: No Do you understand care options if your condition(s) worsen?: Yes-patient verbalized understanding  SDOH Interventions Today    Flowsheet  Row Most Recent Value  SDOH Interventions   Food Insecurity Interventions Intervention Not Indicated  Housing Interventions Intervention Not Indicated  Transportation Interventions Intervention Not Indicated      Interventions Today    Flowsheet Row Most Recent Value  General Interventions   General Interventions Discussed/Reviewed General Interventions Discussed, General Interventions Reviewed, Doctor Visits  [Patient is being followed by Care Coordinator Davina Green]      TOC Interventions Today    Flowsheet Row Most Recent Value  TOC Interventions   TOC Interventions Discussed/Reviewed TOC Interventions Discussed, TOC Interventions Reviewed      RN made [name] RN  Care Coordinator aware of patient  recent hospitalization. Gean Maidens BSN RN [facility] [phone]

## 2023-02-01 ENCOUNTER — Emergency Department (HOSPITAL_COMMUNITY): Payer: Medicare Other

## 2023-02-01 ENCOUNTER — Encounter (HOSPITAL_COMMUNITY): Payer: Self-pay

## 2023-02-01 ENCOUNTER — Ambulatory Visit: Payer: Self-pay

## 2023-02-01 ENCOUNTER — Inpatient Hospital Stay (HOSPITAL_COMMUNITY)
Admission: EM | Admit: 2023-02-01 | Discharge: 2023-02-05 | DRG: 683 | Disposition: A | Discharge status: Skilled Nursing Facility | Payer: Medicare Other | Attending: Internal Medicine | Admitting: Internal Medicine

## 2023-02-01 DIAGNOSIS — K8020 Calculus of gallbladder without cholecystitis without obstruction: Secondary | ICD-10-CM | POA: Diagnosis not present

## 2023-02-01 DIAGNOSIS — I2510 Atherosclerotic heart disease of native coronary artery without angina pectoris: Secondary | ICD-10-CM | POA: Diagnosis present

## 2023-02-01 DIAGNOSIS — I951 Orthostatic hypotension: Secondary | ICD-10-CM | POA: Diagnosis not present

## 2023-02-01 DIAGNOSIS — I129 Hypertensive chronic kidney disease with stage 1 through stage 4 chronic kidney disease, or unspecified chronic kidney disease: Secondary | ICD-10-CM | POA: Diagnosis present

## 2023-02-01 DIAGNOSIS — Z87891 Personal history of nicotine dependence: Secondary | ICD-10-CM | POA: Diagnosis not present

## 2023-02-01 DIAGNOSIS — R001 Bradycardia, unspecified: Secondary | ICD-10-CM | POA: Diagnosis present

## 2023-02-01 DIAGNOSIS — Z7901 Long term (current) use of anticoagulants: Secondary | ICD-10-CM

## 2023-02-01 DIAGNOSIS — I130 Hypertensive heart and chronic kidney disease with heart failure and stage 1 through stage 4 chronic kidney disease, or unspecified chronic kidney disease: Secondary | ICD-10-CM | POA: Diagnosis present

## 2023-02-01 DIAGNOSIS — S0990XA Unspecified injury of head, initial encounter: Secondary | ICD-10-CM | POA: Diagnosis not present

## 2023-02-01 DIAGNOSIS — Z79899 Other long term (current) drug therapy: Secondary | ICD-10-CM | POA: Diagnosis not present

## 2023-02-01 DIAGNOSIS — Y92009 Unspecified place in unspecified non-institutional (private) residence as the place of occurrence of the external cause: Secondary | ICD-10-CM

## 2023-02-01 DIAGNOSIS — I5030 Unspecified diastolic (congestive) heart failure: Secondary | ICD-10-CM | POA: Diagnosis present

## 2023-02-01 DIAGNOSIS — I25119 Atherosclerotic heart disease of native coronary artery with unspecified angina pectoris: Secondary | ICD-10-CM | POA: Diagnosis present

## 2023-02-01 DIAGNOSIS — Z794 Long term (current) use of insulin: Secondary | ICD-10-CM | POA: Diagnosis not present

## 2023-02-01 DIAGNOSIS — Z951 Presence of aortocoronary bypass graft: Secondary | ICD-10-CM | POA: Diagnosis not present

## 2023-02-01 DIAGNOSIS — I5032 Chronic diastolic (congestive) heart failure: Secondary | ICD-10-CM | POA: Diagnosis present

## 2023-02-01 DIAGNOSIS — R296 Repeated falls: Secondary | ICD-10-CM | POA: Diagnosis present

## 2023-02-01 DIAGNOSIS — N184 Chronic kidney disease, stage 4 (severe): Secondary | ICD-10-CM | POA: Diagnosis not present

## 2023-02-01 DIAGNOSIS — I480 Paroxysmal atrial fibrillation: Secondary | ICD-10-CM | POA: Diagnosis not present

## 2023-02-01 DIAGNOSIS — T465X5A Adverse effect of other antihypertensive drugs, initial encounter: Secondary | ICD-10-CM | POA: Diagnosis present

## 2023-02-01 DIAGNOSIS — Z6828 Body mass index (BMI) 28.0-28.9, adult: Secondary | ICD-10-CM

## 2023-02-01 DIAGNOSIS — I491 Atrial premature depolarization: Secondary | ICD-10-CM | POA: Diagnosis not present

## 2023-02-01 DIAGNOSIS — N189 Chronic kidney disease, unspecified: Secondary | ICD-10-CM | POA: Diagnosis present

## 2023-02-01 DIAGNOSIS — Z1152 Encounter for screening for COVID-19: Secondary | ICD-10-CM

## 2023-02-01 DIAGNOSIS — R109 Unspecified abdominal pain: Secondary | ICD-10-CM | POA: Diagnosis not present

## 2023-02-01 DIAGNOSIS — R531 Weakness: Secondary | ICD-10-CM | POA: Diagnosis not present

## 2023-02-01 DIAGNOSIS — E876 Hypokalemia: Secondary | ICD-10-CM | POA: Diagnosis not present

## 2023-02-01 DIAGNOSIS — E785 Hyperlipidemia, unspecified: Secondary | ICD-10-CM | POA: Diagnosis present

## 2023-02-01 DIAGNOSIS — R627 Adult failure to thrive: Secondary | ICD-10-CM | POA: Diagnosis present

## 2023-02-01 DIAGNOSIS — D509 Iron deficiency anemia, unspecified: Secondary | ICD-10-CM | POA: Diagnosis present

## 2023-02-01 DIAGNOSIS — K529 Noninfective gastroenteritis and colitis, unspecified: Secondary | ICD-10-CM | POA: Diagnosis not present

## 2023-02-01 DIAGNOSIS — R008 Other abnormalities of heart beat: Secondary | ICD-10-CM | POA: Diagnosis not present

## 2023-02-01 DIAGNOSIS — Z602 Problems related to living alone: Secondary | ICD-10-CM | POA: Diagnosis present

## 2023-02-01 DIAGNOSIS — K7460 Unspecified cirrhosis of liver: Secondary | ICD-10-CM | POA: Diagnosis present

## 2023-02-01 DIAGNOSIS — K5732 Diverticulitis of large intestine without perforation or abscess without bleeding: Secondary | ICD-10-CM | POA: Diagnosis present

## 2023-02-01 DIAGNOSIS — E119 Type 2 diabetes mellitus without complications: Secondary | ICD-10-CM | POA: Diagnosis not present

## 2023-02-01 DIAGNOSIS — R54 Age-related physical debility: Secondary | ICD-10-CM | POA: Diagnosis present

## 2023-02-01 DIAGNOSIS — E1122 Type 2 diabetes mellitus with diabetic chronic kidney disease: Secondary | ICD-10-CM | POA: Diagnosis present

## 2023-02-01 DIAGNOSIS — Z7401 Bed confinement status: Secondary | ICD-10-CM | POA: Diagnosis not present

## 2023-02-01 DIAGNOSIS — M6281 Muscle weakness (generalized): Secondary | ICD-10-CM | POA: Diagnosis present

## 2023-02-01 DIAGNOSIS — R41841 Cognitive communication deficit: Secondary | ICD-10-CM | POA: Diagnosis present

## 2023-02-01 DIAGNOSIS — T07XXXD Unspecified multiple injuries, subsequent encounter: Secondary | ICD-10-CM | POA: Diagnosis present

## 2023-02-01 DIAGNOSIS — E1165 Type 2 diabetes mellitus with hyperglycemia: Secondary | ICD-10-CM | POA: Diagnosis present

## 2023-02-01 DIAGNOSIS — Z823 Family history of stroke: Secondary | ICD-10-CM

## 2023-02-01 DIAGNOSIS — N179 Acute kidney failure, unspecified: Principal | ICD-10-CM | POA: Diagnosis present

## 2023-02-01 DIAGNOSIS — E860 Dehydration: Secondary | ICD-10-CM | POA: Diagnosis present

## 2023-02-01 DIAGNOSIS — Z833 Family history of diabetes mellitus: Secondary | ICD-10-CM

## 2023-02-01 DIAGNOSIS — Z8673 Personal history of transient ischemic attack (TIA), and cerebral infarction without residual deficits: Secondary | ICD-10-CM

## 2023-02-01 DIAGNOSIS — R112 Nausea with vomiting, unspecified: Secondary | ICD-10-CM | POA: Diagnosis present

## 2023-02-01 DIAGNOSIS — E118 Type 2 diabetes mellitus with unspecified complications: Secondary | ICD-10-CM | POA: Diagnosis present

## 2023-02-01 DIAGNOSIS — Z8249 Family history of ischemic heart disease and other diseases of the circulatory system: Secondary | ICD-10-CM

## 2023-02-01 DIAGNOSIS — W19XXXA Unspecified fall, initial encounter: Secondary | ICD-10-CM | POA: Diagnosis present

## 2023-02-01 DIAGNOSIS — R1111 Vomiting without nausea: Secondary | ICD-10-CM | POA: Diagnosis not present

## 2023-02-01 DIAGNOSIS — R279 Unspecified lack of coordination: Secondary | ICD-10-CM | POA: Diagnosis present

## 2023-02-01 DIAGNOSIS — I10 Essential (primary) hypertension: Secondary | ICD-10-CM | POA: Diagnosis not present

## 2023-02-01 LAB — COMPREHENSIVE METABOLIC PANEL
ALT: 13 U/L (ref 0–44)
AST: 26 U/L (ref 15–41)
Albumin: 3.3 g/dL — ABNORMAL LOW (ref 3.5–5.0)
Alkaline Phosphatase: 93 U/L (ref 38–126)
Anion gap: 26 — ABNORMAL HIGH (ref 5–15)
BUN: 81 mg/dL — ABNORMAL HIGH (ref 8–23)
CO2: 14 mmol/L — ABNORMAL LOW (ref 22–32)
Calcium: 9.4 mg/dL (ref 8.9–10.3)
Chloride: 98 mmol/L (ref 98–111)
Creatinine, Ser: 3.5 mg/dL — ABNORMAL HIGH (ref 0.61–1.24)
GFR, Estimated: 17 mL/min — ABNORMAL LOW (ref >60)
Glucose, Bld: 119 mg/dL — ABNORMAL HIGH (ref 70–99)
Potassium: 3.9 mmol/L (ref 3.5–5.1)
Sodium: 138 mmol/L (ref 135–145)
Total Bilirubin: 1.5 mg/dL — ABNORMAL HIGH (ref 0.3–1.2)
Total Protein: 7.6 g/dL (ref 6.5–8.1)

## 2023-02-01 LAB — URINALYSIS, W/ REFLEX TO CULTURE (INFECTION SUSPECTED)
Bacteria, UA: NONE SEEN
Bilirubin Urine: NEGATIVE
Glucose, UA: 50 mg/dL — AB
Hgb urine dipstick: NEGATIVE
Ketones, ur: NEGATIVE mg/dL
Leukocytes,Ua: NEGATIVE
Nitrite: NEGATIVE
Protein, ur: 100 mg/dL — AB
Specific Gravity, Urine: 1.01 (ref 1.005–1.030)
pH: 5 (ref 5.0–8.0)

## 2023-02-01 LAB — I-STAT VENOUS BLOOD GAS, ED
Acid-base deficit: 4 mmol/L — ABNORMAL HIGH (ref 0.0–2.0)
Bicarbonate: 20.2 mmol/L (ref 20.0–28.0)
Calcium, Ion: 1.09 mmol/L — ABNORMAL LOW (ref 1.15–1.40)
HCT: 42 % (ref 39.0–52.0)
Hemoglobin: 14.3 g/dL (ref 13.0–17.0)
O2 Saturation: 69 %
Potassium: 3.7 mmol/L (ref 3.5–5.1)
Sodium: 139 mmol/L (ref 135–145)
TCO2: 21 mmol/L — ABNORMAL LOW (ref 22–32)
pCO2, Ven: 32.8 mmHg — ABNORMAL LOW (ref 44–60)
pH, Ven: 7.397 (ref 7.25–7.43)
pO2, Ven: 36 mmHg (ref 32–45)

## 2023-02-01 LAB — RESP PANEL BY RT-PCR (RSV, FLU A&B, COVID)  RVPGX2
Influenza A by PCR: NEGATIVE
Influenza B by PCR: NEGATIVE
Resp Syncytial Virus by PCR: NEGATIVE
SARS Coronavirus 2 by RT PCR: NEGATIVE

## 2023-02-01 LAB — CBG MONITORING, ED: Glucose-Capillary: 92 mg/dL (ref 70–99)

## 2023-02-01 LAB — CBC
HCT: 44.8 % (ref 39.0–52.0)
Hemoglobin: 14.9 g/dL (ref 13.0–17.0)
MCH: 26.8 pg (ref 26.0–34.0)
MCHC: 33.3 g/dL (ref 30.0–36.0)
MCV: 80.7 fL (ref 80.0–100.0)
Platelets: 332 10*3/uL (ref 150–400)
RBC: 5.55 MIL/uL (ref 4.22–5.81)
RDW: 14.6 % (ref 11.5–15.5)
WBC: 11.7 10*3/uL — ABNORMAL HIGH (ref 4.0–10.5)
nRBC: 0 % (ref 0.0–0.2)

## 2023-02-01 LAB — TROPONIN I (HIGH SENSITIVITY)
Troponin I (High Sensitivity): 67 ng/L — ABNORMAL HIGH (ref <18)
Troponin I (High Sensitivity): 68 ng/L — ABNORMAL HIGH (ref <18)

## 2023-02-01 LAB — SALICYLATE LEVEL: Salicylate Lvl: <7 mg/dL — ABNORMAL LOW (ref 7.0–30.0)

## 2023-02-01 LAB — LIPASE, BLOOD: Lipase: 40 U/L (ref 11–51)

## 2023-02-01 LAB — LACTIC ACID, PLASMA
Lactic Acid, Venous: 1.5 mmol/L (ref 0.5–1.9)
Lactic Acid, Venous: 1.4 mmol/L (ref 0.5–1.9)

## 2023-02-01 LAB — ETHANOL: Alcohol, Ethyl (B): <10 mg/dL (ref <10)

## 2023-02-01 MED ORDER — METOPROLOL TARTRATE 25 MG PO TABS
37.5000 mg | ORAL_TABLET | Freq: Two times a day (BID) | ORAL | Status: DC
  Administered 2023-02-02 – 2023-02-03 (×3): 37.5 mg via ORAL
  Filled 2023-02-01 (×2): qty 1
  Filled 2023-02-01: qty 2

## 2023-02-01 MED ORDER — ISOSORBIDE MONONITRATE ER 60 MG PO TB24
60.0000 mg | ORAL_TABLET | Freq: Every day | ORAL | Status: DC
  Administered 2023-02-02: 60 mg via ORAL
  Filled 2023-02-01: qty 2

## 2023-02-01 MED ORDER — INSULIN GLARGINE-YFGN 100 UNIT/ML ~~LOC~~ SOLN
50.0000 [IU] | Freq: Every day | SUBCUTANEOUS | Status: DC
  Administered 2023-02-03 – 2023-02-05 (×3): 50 [IU] via SUBCUTANEOUS
  Filled 2023-02-01 (×5): qty 0.5

## 2023-02-01 MED ORDER — SODIUM CHLORIDE 0.9 % IV BOLUS
1000.0000 mL | Freq: Once | INTRAVENOUS | Status: AC
  Administered 2023-02-01: 1000 mL via INTRAVENOUS

## 2023-02-01 MED ORDER — INSULIN ASPART 100 UNIT/ML IJ SOLN
0.0000 [IU] | Freq: Every day | INTRAMUSCULAR | Status: DC
  Administered 2023-02-02: 2 [IU] via SUBCUTANEOUS

## 2023-02-01 MED ORDER — ACETAMINOPHEN 325 MG PO TABS
650.0000 mg | ORAL_TABLET | Freq: Four times a day (QID) | ORAL | Status: DC | PRN
  Administered 2023-02-04: 650 mg via ORAL
  Filled 2023-02-01: qty 2

## 2023-02-01 MED ORDER — AMLODIPINE BESYLATE 5 MG PO TABS
5.0000 mg | ORAL_TABLET | Freq: Every day | ORAL | Status: DC
  Administered 2023-02-02 – 2023-02-05 (×4): 5 mg via ORAL
  Filled 2023-02-01 (×4): qty 1

## 2023-02-01 MED ORDER — HYDRALAZINE HCL 50 MG PO TABS
100.0000 mg | ORAL_TABLET | Freq: Three times a day (TID) | ORAL | Status: DC
  Administered 2023-02-02: 100 mg via ORAL
  Filled 2023-02-01: qty 2

## 2023-02-01 MED ORDER — METOPROLOL TARTRATE 25 MG PO TABS: 37.5000 mg | ORAL_TABLET | Freq: Two times a day (BID) | ORAL | Status: DC

## 2023-02-01 MED ORDER — APIXABAN 2.5 MG PO TABS
2.5000 mg | ORAL_TABLET | Freq: Two times a day (BID) | ORAL | Status: DC
  Administered 2023-02-01 – 2023-02-05 (×8): 2.5 mg via ORAL
  Filled 2023-02-01 (×8): qty 1

## 2023-02-01 MED ORDER — PROCHLORPERAZINE EDISYLATE 10 MG/2ML IJ SOLN: 10.0000 mg | Freq: Four times a day (QID) | INTRAMUSCULAR | Status: DC | PRN

## 2023-02-01 MED ORDER — ONDANSETRON HCL 4 MG/2ML IJ SOLN
4.0000 mg | Freq: Once | INTRAMUSCULAR | Status: AC
  Administered 2023-02-01: 4 mg via INTRAVENOUS
  Filled 2023-02-01: qty 2

## 2023-02-01 MED ORDER — HEPARIN SODIUM (PORCINE) 5000 UNIT/ML IJ SOLN: 5000.0000 [IU] | Freq: Three times a day (TID) | INTRAMUSCULAR | Status: DC

## 2023-02-01 MED ORDER — SODIUM CHLORIDE 0.9 % IV SOLN: INTRAVENOUS | Status: DC

## 2023-02-01 MED ORDER — INSULIN ASPART 100 UNIT/ML IJ SOLN
0.0000 [IU] | Freq: Three times a day (TID) | INTRAMUSCULAR | Status: DC
  Administered 2023-02-02 – 2023-02-04 (×4): 2 [IU] via SUBCUTANEOUS

## 2023-02-01 MED ORDER — ACETAMINOPHEN 650 MG RE SUPP: 650.0000 mg | Freq: Four times a day (QID) | RECTAL | Status: DC | PRN

## 2023-02-01 MED ORDER — HYDRALAZINE HCL 100 MG PO TABS: 100.0000 mg | ORAL_TABLET | Freq: Three times a day (TID) | ORAL | Status: DC

## 2023-02-01 MED ORDER — TAMSULOSIN HCL 0.4 MG PO CAPS
0.4000 mg | ORAL_CAPSULE | Freq: Every day | ORAL | Status: DC
  Administered 2023-02-02 – 2023-02-05 (×4): 0.4 mg via ORAL
  Filled 2023-02-01 (×4): qty 1

## 2023-02-01 NOTE — ED Notes (Signed)
Called CT, patient coming from CT to room 18 when done.

## 2023-02-01 NOTE — ED Notes (Signed)
Patient transported to CT 

## 2023-02-01 NOTE — ED Triage Notes (Signed)
Pt bib ems from home; family reports past 3 days poor oral intake, along with n/v and generalized weakness; diagnosed with AKI last week, having multiple falls; none in the past day, but 3x over past week; decrease in urine output; pt denies abd pain, cp, sob; 100 cc fluid given pta; endorsed some dizziness with standing; 142/82, HR 90 regular, 98% RA, cbg 120, 12 lead NSR with PACs; pt denies pain currently

## 2023-02-01 NOTE — H&P (Addendum)
PCP:   Eden Emms, NP   Chief Complaint:  Nausea vomiting.  HPI: This is a 82 year old male with past medical history of DM2, HTN, CHF, and CKD 4.  He presents with complaint of nausea and vomiting for the last 7 days.  He denies abdominal pain, diarrhea, fevers chills, lightheadedness or dizziness.  He states he is just weak.  He denies any new or change in medications, there has been no mental status change.  He denies burning urination.  He endorses mild shortness of breath but no coughing.  The patient endorses weakness, he states he fell on Monday but not since.  Patient maintained on Plavix.  Patient was recently admitted with similar symptoms 4/10 to 01/26/2023, including falls.  He was treated with IV hydration and antiemetics.  The patient's symptoms including nausea and vomiting resolved by the time of discharge.  He states his symptoms resumed/returned yesterday.  In the ER patient's creatinine is 3.4, baseline creatinine approximately 2.7.  Patient appeared weak, overnight observation requested  Review of Systems:  The patient denies anorexia, fever, weight loss,, vision loss, decreased hearing, hoarseness, chest pain, syncope, peripheral edema, balance deficits, hemoptysis, abdominal pain, melena, hematochezia, severe indigestion/heartburn, hematuria, incontinence, genital sores, muscle weakness, suspicious skin lesions, transient blindness, depression, unusual weight change, abnormal bleeding, enlarged lymph nodes, angioedema, and breast masses. 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   CAD (coronary artery disease) 09/29/2019   Cervical  spondylosis    Chronic anticoagulation    Chronic diastolic CHF (congestive heart failure) 12/17/2018   CKD (chronic kidney disease) 12/17/2018   Diabetes mellitus    History of GI bleed 09/29/2019   Hyperlipidemia    Hypertension    Iron deficiency anemia due to chronic blood loss    Paroxysmal atrial fibrillation 12/17/2018   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 NO ISCHEMIA   COLONOSCOPY W/ POLYPECTOMY     5 polyps   COLONOSCOPY WITH PROPOFOL N/A 08/29/2019   Procedure: COLONOSCOPY WITH PROPOFOL;  Surgeon: Sherrilyn Rist, MD;  Location: [facility] ENDOSCOPY;  Service: Gastroenterology;  Laterality: N/A;   CORONARY ARTERY BYPASS GRAFT N/A 11/26/2018   Procedure: CORONARY ARTERY BYPASS GRAFTING (CABG), ON PUMP, TIMES FOUR, USING LEFT INTERNAL MAMMARY ARTERY AND ENDOSCOPICALLY HARVESTED LEFT SAPHENOUS VEIN;  Surgeon: Alleen Borne, MD;  Location: [facility] OR;  Service: Open Heart Surgery;  Laterality: N/A;   ESOPHAGOGASTRODUODENOSCOPY (EGD) WITH PROPOFOL N/A 08/29/2019   Procedure: ESOPHAGOGASTRODUODENOSCOPY (EGD) WITH PROPOFOL;  Surgeon: Sherrilyn Rist, MD;  Location: [facility] ENDOSCOPY;  Service: Gastroenterology;  Laterality: N/A;   EUS N/A 03/11/2015   Procedure: LOWER ENDOSCOPIC ULTRASOUND (EUS);  Surgeon: Rachael Fee, MD;  Location: [facility] ENDOSCOPY;  Service: Endoscopy;  Laterality: N/A;   FLEXIBLE SIGMOIDOSCOPY N/A 02/02/2015   Procedure: FLEXIBLE SIGMOIDOSCOPY;  Surgeon: Louis Meckel, MD;  Location: [facility] ENDOSCOPY;  Service: Endoscopy;  Laterality: N/A;  ERBE   HEMOSTASIS CLIP PLACEMENT  08/29/2019  Procedure: HEMOSTASIS CLIP PLACEMENT;  Surgeon: Sherrilyn Rist, MD;  Location: [facility] ENDOSCOPY;  Service: Gastroenterology;;   HOT HEMOSTASIS N/A 08/29/2019   Procedure: HOT HEMOSTASIS (ARGON PLASMA COAGULATION/BICAP);   Surgeon: Sherrilyn Rist, MD;  Location: [facility] ENDOSCOPY;  Service: Gastroenterology;  Laterality: N/A;   LEFT HEART CATH AND CORONARY ANGIOGRAPHY N/A 11/20/2018   Procedure: LEFT HEART CATH AND CORONARY ANGIOGRAPHY;  Surgeon: Kathleene Hazel, MD;  Location: [facility] INVASIVE CV LAB;  Service: Cardiovascular;  Laterality: N/A;   LOOP RECORDER INSERTION N/A 08/14/2017   Procedure: LOOP RECORDER INSERTION;  Surgeon: Hillis Range, MD;  Location: [facility] INVASIVE CV LAB;  Service: Cardiovascular;  Laterality: N/A;   PARTIAL PROCTECTOMY BY TEM N/A 04/08/2015   Procedure: TEM PARTIAL PROCTECTOMY OF RECTAL MASS;  Surgeon: Karie Soda, MD;  Location: [facility] ORS;  Service: General;  Laterality: N/A;   POLYPECTOMY  08/29/2019   Procedure: POLYPECTOMY;  Surgeon: Sherrilyn Rist, MD;  Location: [facility] ENDOSCOPY;  Service: Gastroenterology;;   TEE WITHOUT CARDIOVERSION N/A 06/25/2017   Procedure: TRANSESOPHAGEAL ECHOCARDIOGRAM (TEE);  Surgeon: Chilton Si, MD;  Location: [facility] ENDOSCOPY;  Service: Cardiovascular;  Laterality: N/A;   TEE WITHOUT CARDIOVERSION N/A 11/26/2018   Procedure: TRANSESOPHAGEAL ECHOCARDIOGRAM (TEE);  Surgeon: Alleen Borne, MD;  Location: [facility] OR;  Service: Open Heart Surgery;  Laterality: N/A;   TONSILLECTOMY AND ADENOIDECTOMY     as child    Medications: Prior to Admission medications   Medication Sig Start Date End Date Taking? Authorizing Provider  apixaban (ELIQUIS) 2.5 MG TABS tablet Take 1 tablet (2.5 mg total) by mouth 2 (two) times daily. 12/27/22   Kathleene Hazel, MD  amLODipine (NORVASC) 5 MG tablet TAKE 1 TABLET (5 MG TOTAL) BY MOUTH DAILY. PLEASE CALL TO SCHEDULE AN OVERDUE APPOINTMENT 12/19/22   Kathleene Hazel, MD  chlorthalidone (HYGROTON) 25 MG tablet Take 25 mg by mouth daily.    [provider]  FARXIGA 10 MG TABS tablet Take 10 mg by mouth daily. 12/26/22   [provider]  hydrALAZINE (APRESOLINE) 100 MG tablet Take 1 tablet (100 mg  total) by mouth 3 (three) times daily. Please call to schedule an overdue appointment with Dr. Clifton James for refills, [phone], thank you. 1st attempt. 09/19/22   Kathleene Hazel, MD  insulin glargine-yfgn (SEMGLEE) 100 UNIT/ML Pen Inject 55 Units into the skin daily. 11/09/22   Eden Emms, NP  Insulin Pen Needle (BD PEN NEEDLE NANO 2ND GEN) 32G X 4 MM MISC Use with insulin pen as directed 01/19/22   Eden Emms, NP  isosorbide mononitrate (IMDUR) 60 MG 24 hr tablet TAKE 1 TABLET BY MOUTH EVERY DAY (NEED OFFICE VISIT) 11/29/22   Kathleene Hazel, MD  Lancets [facility] ULTRASOFT) lancets Use as instructed 04/27/21   Romero Belling, MD  metoprolol tartrate (LOPRESSOR) 25 MG tablet Take 37.5 mg by mouth 2 (two) times daily. 11/26/22   [provider]  rosuvastatin (CRESTOR) 20 MG tablet TAKE 1 TABLET BY MOUTH AT BEDTIME. SCHEDULE PHYSICAL EXAM 11/09/22   Eden Emms, NP  tamsulosin (FLOMAX) 0.4 MG CAPS capsule TAKE 1 CAPSULE BY MOUTH EVERY DAY Patient taking differently: Take 0.4 mg by mouth daily. 12/01/19   Lorre Munroe, NP  torsemide (DEMADEX) 20 MG tablet Take 1 tablet (20 mg total) by mouth 2 (two) times daily. 01/08/23   Kathleene Hazel, MD    Allergies:  No Known Allergies  Social History:  reports that he quit  smoking about 28 years ago. His smoking use included cigarettes. He has never used smokeless tobacco. He reports that he does not drink alcohol and does not use drugs.  Family History: Family History  Problem Relation Age of Onset   Cancer Father        "all over"   Coronary artery disease Brother    Diabetes Brother    Stroke Mother    Diabetes Mother    Cancer Brother    Colon cancer Neg Hx    Esophageal cancer Neg Hx    Rectal cancer Neg Hx    Stomach cancer Neg Hx     Physical Exam: Vitals:   02/01/23 1705  BP: 108/69  Pulse: 94  Resp: 18  Temp: 98.7 F (37.1 C)  TempSrc: Oral  SpO2: 99%    General:  Alert and oriented  times three, well developed and nourished, no acute distress, Weak, ill appearing gentleman, Eyes: PERRLA, pink conjunctiva, no scleral icterus ENT: Dry lips and oral mucosa, neck supple, no thyromegaly Lungs: clear to ascultation, no wheeze, no crackles, no use of accessory muscles Cardiovascular: regular rate and rhythm, no regurgitation, no gallops, no murmurs. No carotid bruits, no JVD Abdomen: soft, positive BS, non-tender, non-distended, no organomegaly, not an acute abdomen GU: not examined Neuro: CN II - XII grossly intact, sensation intact Musculoskeletal: strength 5/5 all extremities, no clubbing, cyanosis or edema Skin: no rash, no subcutaneous crepitation, no decubitus, bruising on right upper extremity B/L R>L Psych: appropriate patient  Labs on Admission:  Recent Labs    02/01/23 1720 02/01/23 2057  NA 138 139  K 3.9 3.7  CL 98  --   CO2 14*  --   GLUCOSE 119*  --   BUN 81*  --   CREATININE 3.50*  --   CALCIUM 9.4  --    Recent Labs    02/01/23 1720  AST 26  ALT 13  ALKPHOS 93  BILITOT 1.5*  PROT 7.6  ALBUMIN 3.3*   Recent Labs    02/01/23 1720  LIPASE 40   Recent Labs    02/01/23 1720 02/01/23 2057  WBC 11.7*  --   HGB 14.9 14.3  HCT 44.8 42.0  MCV 80.7  --   PLT 332  --      Micro Results: Recent Results (from the past 240 hour(s))  Resp panel by RT-PCR (RSV, Flu A&B, Covid) Anterior Nasal Swab     Status: None   Collection Time: 02/01/23  7:22 PM   Specimen: Anterior Nasal Swab  Result Value Ref Range Status   SARS Coronavirus 2 by RT PCR NEGATIVE NEGATIVE Final   Influenza A by PCR NEGATIVE NEGATIVE Final   Influenza B by PCR NEGATIVE NEGATIVE Final    Comment: (NOTE) The Xpert Xpress SARS-CoV-2/FLU/RSV plus assay is intended as an aid in the diagnosis of influenza from Nasopharyngeal swab specimens and should not be used as a sole basis for treatment. Nasal washings and aspirates are unacceptable for Xpert Xpress  SARS-CoV-2/FLU/RSV testing.  Fact Sheet for Patients: BloggerCourse.com  Fact Sheet for Healthcare Providers: SeriousBroker.it  This test is not yet approved or cleared by the [place] FDA and has been authorized for detection and/or diagnosis of SARS-CoV-2 by FDA under an Emergency Use Authorization (EUA). This EUA will remain in effect (meaning this test can be used) for the duration of the COVID-19 declaration under Section 564(b)(1) of the Act, 21 U.S.C. section 360bbb-3(b)(1), unless the authorization is terminated  or revoked.     Resp Syncytial Virus by PCR NEGATIVE NEGATIVE Final    Comment: (NOTE) Fact Sheet for Patients: BloggerCourse.com  Fact Sheet for Healthcare Providers: SeriousBroker.it  This test is not yet approved or cleared by the [place] FDA and has been authorized for detection and/or diagnosis of SARS-CoV-2 by FDA under an Emergency Use Authorization (EUA). This EUA will remain in effect (meaning this test can be used) for the duration of the COVID-19 declaration under Section 564(b)(1) of the Act, 21 U.S.C. section 360bbb-3(b)(1), unless the authorization is terminated or revoked.  Performed at [facility], [address] [place], [place] [address]      Radiological Exams on Admission: DG Chest Port 1 View  Result Date: 02/01/2023 CLINICAL DATA:  falls, emesis EXAM: PORTABLE CHEST 1 VIEW COMPARISON:  Chest x-ray 01/24/2023 FINDINGS: The heart and mediastinal contours are unchanged. Wireless cardiac device with chest. No focal consolidation. No pulmonary edema. No pleural effusion. No pneumothorax. No acute osseous abnormality.  Sternotomy wires are intact. IMPRESSION: No active disease. Electronically Signed   By: Tish Frederickson M.D.   On: 02/01/2023 19:43   CT Head Wo Contrast  Result Date: 02/01/2023 CLINICAL DATA:  Head trauma  EXAM: CT HEAD WITHOUT CONTRAST CT CERVICAL SPINE WITHOUT CONTRAST TECHNIQUE: Multidetector CT imaging of the head and cervical spine was performed following the standard protocol without intravenous contrast. Multiplanar CT image reconstructions of the cervical spine were also generated. RADIATION DOSE REDUCTION: This exam was performed according to the departmental dose-optimization program which includes automated exposure control, adjustment of the mA and/or kV according to patient size and/or use of iterative reconstruction technique. COMPARISON:  None Available. FINDINGS: CT HEAD FINDINGS Brain: There is no mass, hemorrhage or extra-axial collection. There is generalized atrophy without lobar predilection. There is hypoattenuation of the periventricular white matter, most commonly indicating chronic ischemic microangiopathy. Old left parietal infarct. Vascular: Atherosclerotic calcification of the vertebral and internal carotid arteries at the skull base. No abnormal hyperdensity of the major intracranial arteries or dural venous sinuses. Skull: The visualized skull base, calvarium and extracranial soft tissues are normal. Sinuses/Orbits: No fluid levels or advanced mucosal thickening of the visualized paranasal sinuses. No mastoid or middle ear effusion. The orbits are normal. CT CERVICAL SPINE FINDINGS Alignment: No static subluxation. Facets are aligned. Occipital condyles are normally positioned. Skull base and vertebrae: No acute fracture. Soft tissues and spinal canal: No prevertebral fluid or swelling. No visible canal hematoma. Disc levels: No advanced spinal canal or neural foraminal stenosis. Upper chest: No pneumothorax, pulmonary nodule or pleural effusion. Other: Normal visualized paraspinal cervical soft tissues. IMPRESSION: 1. No acute intracranial abnormality. 2. 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 3. No acute fracture or static subluxation of the cervical spine.  Electronically Signed   By: Deatra Robinson M.D.   On: 02/01/2023 19:34   CT Cervical Spine Wo Contrast  Result Date: 02/01/2023 CLINICAL DATA:  Head trauma EXAM: CT HEAD WITHOUT CONTRAST CT CERVICAL SPINE WITHOUT CONTRAST TECHNIQUE: Multidetector CT imaging of the head and cervical spine was performed following the standard protocol without intravenous contrast. Multiplanar CT image reconstructions of the cervical spine were also generated. RADIATION DOSE REDUCTION: This exam was performed according to the departmental dose-optimization program which includes automated exposure control, adjustment of the mA and/or kV according to patient size and/or use of iterative reconstruction technique. COMPARISON:  None Available. FINDINGS: CT HEAD FINDINGS Brain: There is no mass, hemorrhage or extra-axial  collection. There is generalized atrophy without lobar predilection. There is hypoattenuation of the periventricular white matter, most commonly indicating chronic ischemic microangiopathy. Old left parietal infarct. Vascular: Atherosclerotic calcification of the vertebral and internal carotid arteries at the skull base. No abnormal hyperdensity of the major intracranial arteries or dural venous sinuses. Skull: The visualized skull base, calvarium and extracranial soft tissues are normal. Sinuses/Orbits: No fluid levels or advanced mucosal thickening of the visualized paranasal sinuses. No mastoid or middle ear effusion. The orbits are normal. CT CERVICAL SPINE FINDINGS Alignment: No static subluxation. Facets are aligned. Occipital condyles are normally positioned. Skull base and vertebrae: No acute fracture. Soft tissues and spinal canal: No prevertebral fluid or swelling. No visible canal hematoma. Disc levels: No advanced spinal canal or neural foraminal stenosis. Upper chest: No pneumothorax, pulmonary nodule or pleural effusion. Other: Normal visualized paraspinal cervical soft tissues. IMPRESSION: 1. No acute  intracranial abnormality. 2. 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 3. No acute fracture or static subluxation of the cervical spine. Electronically Signed   By: Deatra Robinson M.D.   On: 02/01/2023 19:34   CT ABDOMEN PELVIS WO CONTRAST  Result Date: 02/01/2023 CLINICAL DATA:  Abdominal pain, nausea/vomiting EXAM: CT ABDOMEN AND PELVIS WITHOUT CONTRAST TECHNIQUE: Multidetector CT imaging of the abdomen and pelvis was performed following the standard protocol without IV contrast. RADIATION DOSE REDUCTION: This exam was performed according to the departmental dose-optimization program which includes automated exposure control, adjustment of the mA and/or kV according to patient size and/or use of iterative reconstruction technique. COMPARISON:  09/27/2022 FINDINGS: Lower chest: Lung bases are clear. Hepatobiliary: Cirrhosis. Large lamellated gallstone measuring 3.6 cm. No associated inflammatory changes. No intrahepatic or extrahepatic ductal dilatation. Pancreas: Within normal limits. Spleen: Within normal limits. Adrenals/Urinary Tract: Adrenal glands are within normal limits. Kidneys are within normal limits.  No hydronephrosis. Mildly thick-walled bladder, although underdistended. Stomach/Bowel: Stomach is within normal limits. No evidence of bowel obstruction. Normal appendix (series 3/image 85). Mild pericolonic inflammatory changes in the left lower quadrant suggesting very mild proximal sigmoid diverticulitis (series 3/image 84), without associated fluid collection/abscess. No free air to suggest macroscopic perforation. Vascular/Lymphatic: No evidence of abdominal aortic aneurysm. Atherosclerotic calcifications of the abdominal aorta and branch vessels. No suspicious abdominopelvic lymphadenopathy. Reproductive: Prostatomegaly, with enlargement of the central gland indenting the base of the bladder, suggesting BPH. Other: No abdominopelvic ascites. Musculoskeletal: Degenerative  changes of the visualized thoracolumbar spine. IMPRESSION: Very mild proximal sigmoid diverticulitis, without associated fluid collection/abscess. No free air. Cholelithiasis, without associated inflammatory changes. Cirrhosis. Prostatomegaly, suggesting BPH. Electronically Signed   By: Charline Bills M.D.   On: 02/01/2023 19:16    Assessment/Plan Present on Admission:  Acute renal failure 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 -IVF hydration -strict I/O's -hold diuretics -CMP in AM  Gastroenteritis -treating symptomatically. No clear etiology. ? Gastroparesis, ?  Mild diverticulitis -Patient's abdominal exam benign, however, given recurrence of symptoms will initiate treatment for sigmoid diverticulitis, single dose of IV Zosyn ordered.  Defer to a.m. team if continuation of antibiotics deemed necessary -Antiemetics as needed ordered  Elevated T bili/large gallstone -new.  -cirrhosis - new diagnosis -CMP in AM -Abdomen benign, no indication of acute cholecystitis.  Monitor  Prolonged QTc - QTc 530 -mag level ordered -Potassium 3.9   CAD (coronary artery disease)  Chronic diastolic CHF (congestive heart failure) -Diuretics on hold with patient acute on chronic kidney injury.  -Crestor, hydralazine, Norvasc, Imdur, and Farxiga resumed   Essential hypertension -BP  currently borderline normal.  -BP to start in AM. Hold parameters added   HLD (hyperlipidemia) -Crestor resumed   Diabetes mellitus -Sliding scale insulin, Lantus resumed    Paroxysmal atrial fibrillation -currently in A fib, rate controlled -eliquis and metoprolol re-ordered  Cirrhosis -noted on imaging  Janet Humphreys 02/01/2023, 9:52 PM

## 2023-02-01 NOTE — ED Provider Notes (Signed)
[place] EMERGENCY DEPARTMENT AT [facility] Provider Note   CSN: [id number] Arrival date & time: 02/01/23  1700     History  Chief Complaint  Patient presents with   Weakness   Emesis    Jake Gross is a 82 y.o. male.  Patient here with persistent vomiting and abdominal pain and weakness and falls.  Has a history of chronic kidney disease.  Recently discharged for the same.  He has not been able to keep things down here the last 48 hours.  He had at least 7 episodes of nonbloody emesis today.  He feels really weak.  He is on Eliquis for A-fib.  Not having any headache or dizziness or neck pain or shortness of breath.  Nothing makes it worse or better.  He still feeling very nauseous upon my evaluation.  Patient has a history of hypertension, diabetes, stroke in the past as well.  The history is provided by the patient.       Home Medications Prior to Admission medications   Medication Sig Start Date End Date Taking? Authorizing Provider  apixaban (ELIQUIS) 2.5 MG TABS tablet Take 1 tablet (2.5 mg total) by mouth 2 (two) times daily. 12/27/22   Kathleene Hazel, MD  amLODipine (NORVASC) 5 MG tablet TAKE 1 TABLET (5 MG TOTAL) BY MOUTH DAILY. PLEASE CALL TO SCHEDULE AN OVERDUE APPOINTMENT 12/19/22   Kathleene Hazel, MD  chlorthalidone (HYGROTON) 25 MG tablet Take 25 mg by mouth daily.    [provider]  FARXIGA 10 MG TABS tablet Take 10 mg by mouth daily. 12/26/22   [provider]  hydrALAZINE (APRESOLINE) 100 MG tablet Take 1 tablet (100 mg total) by mouth 3 (three) times daily. Please call to schedule an overdue appointment with Dr. Clifton James for refills, [phone], thank you. 1st attempt. 09/19/22   Kathleene Hazel, MD  insulin glargine-yfgn (SEMGLEE) 100 UNIT/ML Pen Inject 55 Units into the skin daily. 11/09/22   Eden Emms, NP  Insulin Pen Needle (BD PEN NEEDLE NANO 2ND GEN) 32G X 4 MM MISC Use with insulin pen as  directed 01/19/22   Eden Emms, NP  isosorbide mononitrate (IMDUR) 60 MG 24 hr tablet TAKE 1 TABLET BY MOUTH EVERY DAY (NEED OFFICE VISIT) 11/29/22   Kathleene Hazel, MD  Lancets [facility] ULTRASOFT) lancets Use as instructed 04/27/21   Romero Belling, MD  metoprolol tartrate (LOPRESSOR) 25 MG tablet Take 37.5 mg by mouth 2 (two) times daily. 11/26/22   [provider]  rosuvastatin (CRESTOR) 20 MG tablet TAKE 1 TABLET BY MOUTH AT BEDTIME. SCHEDULE PHYSICAL EXAM 11/09/22   Eden Emms, NP  tamsulosin (FLOMAX) 0.4 MG CAPS capsule TAKE 1 CAPSULE BY MOUTH EVERY DAY Patient taking differently: Take 0.4 mg by mouth daily. 12/01/19   Lorre Munroe, NP  torsemide (DEMADEX) 20 MG tablet Take 1 tablet (20 mg total) by mouth 2 (two) times daily. 01/08/23   Kathleene Hazel, MD      Allergies    Patient has no known allergies.    Review of Systems   Review of Systems  Physical Exam Updated Vital Signs BP 108/69   Pulse 94   Temp 98.7 F (37.1 C) (Oral)   Resp 18   SpO2 99%  Physical Exam Vitals and nursing note reviewed.  Constitutional:      General: He is not in acute distress.    Appearance: He is well-developed. He is ill-appearing.  HENT:  Head: Normocephalic and atraumatic.     Nose: Nose normal.     Mouth/Throat:     Mouth: Mucous membranes are moist.  Eyes:     Extraocular Movements: Extraocular movements intact.     Conjunctiva/sclera: Conjunctivae normal.     Pupils: Pupils are equal, round, and reactive to light.  Cardiovascular:     Rate and Rhythm: Normal rate and regular rhythm.     Pulses: Normal pulses.     Heart sounds: Normal heart sounds. No murmur heard. Pulmonary:     Effort: Pulmonary effort is normal. No respiratory distress.     Breath sounds: Normal breath sounds.  Abdominal:     Palpations: Abdomen is soft.     Tenderness: There is abdominal tenderness.  Musculoskeletal:        General: No swelling.     Cervical back: Neck  supple.  Skin:    General: Skin is warm and dry.     Capillary Refill: Capillary refill takes less than 2 seconds.  Neurological:     General: No focal deficit present.     Mental Status: He is alert and oriented to person, place, and time.     Cranial Nerves: No cranial nerve deficit.     Sensory: No sensory deficit.     Motor: No weakness.     Coordination: Coordination normal.     Comments: 5+/5 strength, no sensation, no drift, no visual field defects   Psychiatric:        Mood and Affect: Mood normal.     ED Results / Procedures / Treatments   Labs (all labs ordered are listed, but only abnormal results are displayed) Labs Reviewed  COMPREHENSIVE METABOLIC PANEL - Abnormal; Notable for the following components:      Result Value   CO2 14 (*)    Glucose, Bld 119 (*)    BUN 81 (*)    Creatinine, Ser 3.50 (*)    Albumin 3.3 (*)    Total Bilirubin 1.5 (*)    GFR, Estimated 17 (*)    Anion gap 26 (*)    All other components within normal limits  CBC - Abnormal; Notable for the following components:   WBC 11.7 (*)    All other components within normal limits  I-STAT VENOUS BLOOD GAS, ED - Abnormal; Notable for the following components:   pCO2, Ven 32.8 (*)    TCO2 21 (*)    Acid-base deficit 4.0 (*)    Calcium, Ion 1.09 (*)    All other components within normal limits  TROPONIN I (HIGH SENSITIVITY) - Abnormal; Notable for the following components:   Troponin I (High Sensitivity) 67 (*)    All other components within normal limits  RESP PANEL BY RT-PCR (RSV, FLU A&B, COVID)  RVPGX2  LIPASE, BLOOD  URINALYSIS, W/ REFLEX TO CULTURE (INFECTION SUSPECTED)  SALICYLATE LEVEL  ETHANOL  LACTIC ACID, PLASMA  LACTIC ACID, PLASMA  TROPONIN I (HIGH SENSITIVITY)    EKG EKG Interpretation  Date/Time:  Thursday February 01 2023 17:06:44 EDT Ventricular Rate:  98 PR Interval:  109 QRS Duration: 95 QT Interval:  415 QTC Calculation: 530 R Axis:   45 Text  Interpretation: Sinus or ectopic atrial tachycardia Multiple premature complexes, vent & supraven Short PR interval Probable left ventricular hypertrophy Borderline T abnormalities, inferior leads Prolonged QT interval Confirmed by Virgina Norfolk [phone]) on 02/01/2023 6:59:06 PM  Radiology DG Chest Port 1 View  Result Date: 02/01/2023 CLINICAL DATA:  falls,  emesis EXAM: PORTABLE CHEST 1 VIEW COMPARISON:  Chest x-ray 01/24/2023 FINDINGS: The heart and mediastinal contours are unchanged. Wireless cardiac device with chest. No focal consolidation. No pulmonary edema. No pleural effusion. No pneumothorax. No acute osseous abnormality.  Sternotomy wires are intact. IMPRESSION: No active disease. Electronically Signed   By: Tish Frederickson M.D.   On: 02/01/2023 19:43   CT Head Wo Contrast  Result Date: 02/01/2023 CLINICAL DATA:  Head trauma EXAM: CT HEAD WITHOUT CONTRAST CT CERVICAL SPINE WITHOUT CONTRAST TECHNIQUE: Multidetector CT imaging of the head and cervical spine was performed following the standard protocol without intravenous contrast. Multiplanar CT image reconstructions of the cervical spine were also generated. RADIATION DOSE REDUCTION: This exam was performed according to the departmental dose-optimization program which includes automated exposure control, adjustment of the mA and/or kV according to patient size and/or use of iterative reconstruction technique. COMPARISON:  None Available. FINDINGS: CT HEAD FINDINGS Brain: There is no mass, hemorrhage or extra-axial collection. There is generalized atrophy without lobar predilection. There is hypoattenuation of the periventricular white matter, most commonly indicating chronic ischemic microangiopathy. Old left parietal infarct. Vascular: Atherosclerotic calcification of the vertebral and internal carotid arteries at the skull base. No abnormal hyperdensity of the major intracranial arteries or dural venous sinuses. Skull: The visualized skull base,  calvarium and extracranial soft tissues are normal. Sinuses/Orbits: No fluid levels or advanced mucosal thickening of the visualized paranasal sinuses. No mastoid or middle ear effusion. The orbits are normal. CT CERVICAL SPINE FINDINGS Alignment: No static subluxation. Facets are aligned. Occipital condyles are normally positioned. Skull base and vertebrae: No acute fracture. Soft tissues and spinal canal: No prevertebral fluid or swelling. No visible canal hematoma. Disc levels: No advanced spinal canal or neural foraminal stenosis. Upper chest: No pneumothorax, pulmonary nodule or pleural effusion. Other: Normal visualized paraspinal cervical soft tissues. IMPRESSION: 1. No acute intracranial abnormality. 2. 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 3. No acute fracture or static subluxation of the cervical spine. Electronically Signed   By: Deatra Robinson M.D.   On: 02/01/2023 19:34   CT Cervical Spine Wo Contrast  Result Date: 02/01/2023 CLINICAL DATA:  Head trauma EXAM: CT HEAD WITHOUT CONTRAST CT CERVICAL SPINE WITHOUT CONTRAST TECHNIQUE: Multidetector CT imaging of the head and cervical spine was performed following the standard protocol without intravenous contrast. Multiplanar CT image reconstructions of the cervical spine were also generated. RADIATION DOSE REDUCTION: This exam was performed according to the departmental dose-optimization program which includes automated exposure control, adjustment of the mA and/or kV according to patient size and/or use of iterative reconstruction technique. COMPARISON:  None Available. FINDINGS: CT HEAD FINDINGS Brain: There is no mass, hemorrhage or extra-axial collection. There is generalized atrophy without lobar predilection. There is hypoattenuation of the periventricular white matter, most commonly indicating chronic ischemic microangiopathy. Old left parietal infarct. Vascular: Atherosclerotic calcification of the vertebral and internal  carotid arteries at the skull base. No abnormal hyperdensity of the major intracranial arteries or dural venous sinuses. Skull: The visualized skull base, calvarium and extracranial soft tissues are normal. Sinuses/Orbits: No fluid levels or advanced mucosal thickening of the visualized paranasal sinuses. No mastoid or middle ear effusion. The orbits are normal. CT CERVICAL SPINE FINDINGS Alignment: No static subluxation. Facets are aligned. Occipital condyles are normally positioned. Skull base and vertebrae: No acute fracture. Soft tissues and spinal canal: No prevertebral fluid or swelling. No visible canal hematoma. Disc levels: No advanced spinal canal or neural foraminal stenosis. Upper  chest: No pneumothorax, pulmonary nodule or pleural effusion. Other: Normal visualized paraspinal cervical soft tissues. IMPRESSION: 1. No acute intracranial abnormality. 2. 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 3. No acute fracture or static subluxation of the cervical spine. Electronically Signed   By: Deatra Robinson M.D.   On: 02/01/2023 19:34   CT ABDOMEN PELVIS WO CONTRAST  Result Date: 02/01/2023 CLINICAL DATA:  Abdominal pain, nausea/vomiting EXAM: CT ABDOMEN AND PELVIS WITHOUT CONTRAST TECHNIQUE: Multidetector CT imaging of the abdomen and pelvis was performed following the standard protocol without IV contrast. RADIATION DOSE REDUCTION: This exam was performed according to the departmental dose-optimization program which includes automated exposure control, adjustment of the mA and/or kV according to patient size and/or use of iterative reconstruction technique. COMPARISON:  09/27/2022 FINDINGS: Lower chest: Lung bases are clear. Hepatobiliary: Cirrhosis. Large lamellated gallstone measuring 3.6 cm. No associated inflammatory changes. No intrahepatic or extrahepatic ductal dilatation. Pancreas: Within normal limits. Spleen: Within normal limits. Adrenals/Urinary Tract: Adrenal glands are  within normal limits. Kidneys are within normal limits.  No hydronephrosis. Mildly thick-walled bladder, although underdistended. Stomach/Bowel: Stomach is within normal limits. No evidence of bowel obstruction. Normal appendix (series 3/image 85). Mild pericolonic inflammatory changes in the left lower quadrant suggesting very mild proximal sigmoid diverticulitis (series 3/image 84), without associated fluid collection/abscess. No free air to suggest macroscopic perforation. Vascular/Lymphatic: No evidence of abdominal aortic aneurysm. Atherosclerotic calcifications of the abdominal aorta and branch vessels. No suspicious abdominopelvic lymphadenopathy. Reproductive: Prostatomegaly, with enlargement of the central gland indenting the base of the bladder, suggesting BPH. Other: No abdominopelvic ascites. Musculoskeletal: Degenerative changes of the visualized thoracolumbar spine. IMPRESSION: Very mild proximal sigmoid diverticulitis, without associated fluid collection/abscess. No free air. Cholelithiasis, without associated inflammatory changes. Cirrhosis. Prostatomegaly, suggesting BPH. Electronically Signed   By: Charline Bills M.D.   On: 02/01/2023 19:16    Procedures Procedures    Medications Ordered in ED Medications  ondansetron (ZOFRAN) injection 4 mg (4 mg Intravenous Given 02/01/23 1928)  sodium chloride 0.9 % bolus 1,000 mL (1,000 mLs Intravenous New Bag/Given 02/01/23 1928)    ED Course/ Medical Decision Making/ A&P Clinical Course as of 02/01/23 2115  Thu Feb 01, 2023  1859 Comprehensive metabolic panel [LS]    Clinical Course User Index [LS] Curley Spice, MD                             Medical Decision Making Amount and/or Complexity of Data Reviewed Labs: ordered.  Risk Prescription drug management. Decision regarding hospitalization.   Jake Gross is here with nausea and vomiting and weakness.  History of CKD, A-fib, hypertension, high cholesterol, stroke.   Differential diagnosis viral process versus foodborne illness versus hyperemesis syndrome versus less likely ACS, pneumonia.  Patient with nausea and vomiting for the last 2 days.  She has been able to tolerate p.o.  Some some abdominal discomfort but none currently.  Blood work is already done prior to my evaluation.  Creatinine appears to be above baseline at 3.5.  Bicarb is 14, BUN is 81.  Anion gap is 26.  However, pH is normal.  Troponin around baseline.  Per my review and interpretation of images is no pneumonia, no head injury.  CT scan abdomen pelvis per radiology report with may be diverticulitis.  Overall, he has metabolic derangements.  I am not sure if this is in setting of chronic kidney disease or if this is just GI  related losses given that he has a normal pH.  Given his metabolic derangements and worsening AKI we will admit him for further care.  Salicylate level, ethanol level, lactic acid is pending.  Will defer antibiotics for diverticulitis to medicine team.  He does not have a major white count or fever is not having particular abdominal pain at this time.  This chart was dictated using voice recognition software.  Despite best efforts to proofread,  errors can occur which can change the documentation meaning.         Final Clinical Impression(s) / ED Diagnoses Final diagnoses:  Dehydration  Nausea and vomiting, unspecified vomiting type    Rx / DC Orders ED Discharge Orders     None         Virgina Norfolk, DO 02/01/23 2115

## 2023-02-01 NOTE — Patient Outreach (Signed)
  Care Coordination   Follow Up Visit Note   02/01/2023 Name: Cap Wernicke Hovsepian MRN: [id number] DOB: [birth year]  Murphy S Kilner is a 82 y.o. year old male who sees Cable, Genene Churn, NP for primary care. I spoke with  [name] and patient's neighbor, Catarina Hartshorn.  Patient gave verbal authorization to speak with neighbor Catarina Hartshorn regarding his health information.   What matters to the patients health and wellness today?  Telephone call to patient.  Patient states he has thrown up 7 times today.  Patient requested RNCM speak with his neighbor Catarina Hartshorn regarding his health information. HIPAA confirmed with neighbor for patient.  Neighbor states patient called her to come over to his home. She states she arrived at patients home around 1:45 pm.  She states patient stated he needed to take his medication. Neighbor states she gave patient water to take with his medication.  Neighbor states patient took medication then proceeded to throw up.  She states he's had some dry heaving.  Neighbor states patient is not complaining of chest pain or any other symptoms. She states patient has his eyes closed, appears weak, and his hands are shaking.   Neighbor states she informed patient RNCM advised to call 911/ EMS.  Neighbor states patient refused and is waiting for son to return call. Neighbor states she was able to talk with patients son to give him update on patients condition.    Goals Addressed             This Visit's Progress    Patient stated: decrease blood pressure and post hospital/SNF discharge follow up       Interventions Today    Flowsheet Row Most Recent Value  Chronic Disease   Chronic disease during today's visit Chronic Kidney Disease/End Stage Renal Disease (ESRD), Other  [nausea/ vomiting]  General Interventions   General Interventions Discussed/Reviewed General Interventions Reviewed  [Advised to go to ED. Neighbor advised to call patients son/ daughter to have them take  patient to ED or call 911. Returned call to patient/neighbor within 15 min to confirm patients son/ daughter contacted. Advised to call 911/ EMS within 30 minutes.]                  SDOH assessments and interventions completed:  No     Care Coordination Interventions:  Yes, provided   Follow up plan: Follow up call scheduled for will attempt return call within 1 week    Encounter Outcome:  Pt. Visit Completed   [name] RN,BSN,CCM [facility] Care Coordination [phone] direct line

## 2023-02-01 NOTE — ED Provider Triage Note (Signed)
Emergency Medicine Provider Triage Evaluation Note  Jake Gross , a 82 y.o. male  was evaluated in triage.  Pt complains of persistent vomiting and falls.  Patient was recently admitted to the hospital for similar symptoms and found to have acute on chronic kidney injury, was given fluids and discharged on 4/12.  He reports since discharge she has continued to have vomiting and has not been able to keep much down, tried to drink water this morning and had 7 episodes of nonbloody emesis.  He denies abdominal or chest pain.  He reports he has fallen multiple times he thinks the last fall was Monday he has multiple scabs and areas of dried blood over the right arm.  Patient appears uncomfortable and has difficulty sitting still.  He is unsure if he hit his head during the fall.  Patient is on Eliquis for anticoagulation.  Review of Systems  Positive: Vomiting, malaise, falls Negative: Abdominal pain, chest pain  Physical Exam  BP 108/69   Pulse 94   Temp 98.7 F (37.1 C) (Oral)   Resp 18   SpO2 99%  Gen:   Awake, ill-appearing, unable to sit still Resp:  Normal effort  MSK:   Moves extremities without difficulty, scabs and dried blood noted over right forearm without swelling or bony tenderness Other:  Abdomen nondistended and nontender  Medical Decision Making  Medically screening exam initiated at 5:39 PM.  Appropriate orders placed.  Jake Gross was informed that the remainder of the evaluation will be completed by another provider, this initial triage assessment does not replace that evaluation, and the importance of remaining in the ED until their evaluation is complete.  Patient is ill-appearing and will need bed for further evaluation, triage nurse notified, laboratory and imaging evaluation initiated.   Dartha Lodge, [place] 02/01/23 1745

## 2023-02-02 ENCOUNTER — Other Ambulatory Visit: Payer: Self-pay

## 2023-02-02 DIAGNOSIS — I2510 Atherosclerotic heart disease of native coronary artery without angina pectoris: Secondary | ICD-10-CM

## 2023-02-02 DIAGNOSIS — R531 Weakness: Secondary | ICD-10-CM

## 2023-02-02 DIAGNOSIS — I129 Hypertensive chronic kidney disease with stage 1 through stage 4 chronic kidney disease, or unspecified chronic kidney disease: Secondary | ICD-10-CM

## 2023-02-02 DIAGNOSIS — Y92009 Unspecified place in unspecified non-institutional (private) residence as the place of occurrence of the external cause: Secondary | ICD-10-CM

## 2023-02-02 DIAGNOSIS — I480 Paroxysmal atrial fibrillation: Secondary | ICD-10-CM | POA: Diagnosis not present

## 2023-02-02 DIAGNOSIS — W19XXXA Unspecified fall, initial encounter: Secondary | ICD-10-CM

## 2023-02-02 DIAGNOSIS — I5032 Chronic diastolic (congestive) heart failure: Secondary | ICD-10-CM | POA: Diagnosis not present

## 2023-02-02 DIAGNOSIS — E119 Type 2 diabetes mellitus without complications: Secondary | ICD-10-CM | POA: Diagnosis not present

## 2023-02-02 DIAGNOSIS — I10 Essential (primary) hypertension: Secondary | ICD-10-CM

## 2023-02-02 LAB — CBC WITH DIFFERENTIAL/PLATELET
Abs Immature Granulocytes: 0.04 10*3/uL (ref 0.00–0.07)
Basophils Absolute: 0.1 10*3/uL (ref 0.0–0.1)
Basophils Relative: 1 %
Eosinophils Absolute: 0.1 10*3/uL (ref 0.0–0.5)
Eosinophils Relative: 1 %
HCT: 39.5 % (ref 39.0–52.0)
Hemoglobin: 13 g/dL (ref 13.0–17.0)
Immature Granulocytes: 0 %
Lymphocytes Relative: 17 %
Lymphs Abs: 1.7 10*3/uL (ref 0.7–4.0)
MCH: 26.3 pg (ref 26.0–34.0)
MCHC: 32.9 g/dL (ref 30.0–36.0)
MCV: 80 fL (ref 80.0–100.0)
Monocytes Absolute: 1 10*3/uL (ref 0.1–1.0)
Monocytes Relative: 10 %
Neutro Abs: 7.5 10*3/uL (ref 1.7–7.7)
Neutrophils Relative %: 71 %
Platelets: 257 10*3/uL (ref 150–400)
RBC: 4.94 MIL/uL (ref 4.22–5.81)
RDW: 14.5 % (ref 11.5–15.5)
WBC: 10.4 10*3/uL (ref 4.0–10.5)
nRBC: 0 % (ref 0.0–0.2)

## 2023-02-02 LAB — COMPREHENSIVE METABOLIC PANEL
ALT: 12 U/L (ref 0–44)
AST: 18 U/L (ref 15–41)
Albumin: 2.9 g/dL — ABNORMAL LOW (ref 3.5–5.0)
Alkaline Phosphatase: 83 U/L (ref 38–126)
Anion gap: 15 (ref 5–15)
BUN: 80 mg/dL — ABNORMAL HIGH (ref 8–23)
CO2: 19 mmol/L — ABNORMAL LOW (ref 22–32)
Calcium: 8.6 mg/dL — ABNORMAL LOW (ref 8.9–10.3)
Chloride: 102 mmol/L (ref 98–111)
Creatinine, Ser: 3.4 mg/dL — ABNORMAL HIGH (ref 0.61–1.24)
GFR, Estimated: 18 mL/min — ABNORMAL LOW (ref >60)
Glucose, Bld: 80 mg/dL (ref 70–99)
Potassium: 3.1 mmol/L — ABNORMAL LOW (ref 3.5–5.1)
Sodium: 136 mmol/L (ref 135–145)
Total Bilirubin: 1.4 mg/dL — ABNORMAL HIGH (ref 0.3–1.2)
Total Protein: 6.7 g/dL (ref 6.5–8.1)

## 2023-02-02 LAB — CBG MONITORING, ED
Glucose-Capillary: 76 mg/dL (ref 70–99)
Glucose-Capillary: 81 mg/dL (ref 70–99)

## 2023-02-02 LAB — MAGNESIUM: Magnesium: 2.4 mg/dL (ref 1.7–2.4)

## 2023-02-02 LAB — TSH: TSH: 0.731 u[IU]/mL (ref 0.350–4.500)

## 2023-02-02 LAB — GLUCOSE, CAPILLARY
Glucose-Capillary: 129 mg/dL — ABNORMAL HIGH (ref 70–99)
Glucose-Capillary: 146 mg/dL — ABNORMAL HIGH (ref 70–99)
Glucose-Capillary: 223 mg/dL — ABNORMAL HIGH (ref 70–99)

## 2023-02-02 LAB — CK: Total CK: 66 U/L (ref 49–397)

## 2023-02-02 MED ORDER — PIPERACILLIN-TAZOBACTAM 3.375 G IVPB 30 MIN
3.3750 g | Freq: Once | INTRAVENOUS | Status: AC
  Administered 2023-02-02: 3.375 g via INTRAVENOUS
  Filled 2023-02-02: qty 50

## 2023-02-02 MED ORDER — HYDRALAZINE HCL 50 MG PO TABS
50.0000 mg | ORAL_TABLET | Freq: Three times a day (TID) | ORAL | Status: DC
  Administered 2023-02-02 – 2023-02-05 (×7): 50 mg via ORAL
  Filled 2023-02-02 (×9): qty 1

## 2023-02-02 MED ORDER — POTASSIUM CHLORIDE CRYS ER 20 MEQ PO TBCR
40.0000 meq | EXTENDED_RELEASE_TABLET | Freq: Once | ORAL | Status: AC
  Administered 2023-02-02: 40 meq via ORAL
  Filled 2023-02-02: qty 2

## 2023-02-02 MED ORDER — ISOSORBIDE MONONITRATE ER 30 MG PO TB24
30.0000 mg | ORAL_TABLET | Freq: Every day | ORAL | Status: DC
  Administered 2023-02-03 – 2023-02-04 (×2): 30 mg via ORAL
  Filled 2023-02-02 (×2): qty 1

## 2023-02-02 MED ORDER — POTASSIUM CHLORIDE 10 MEQ/100ML IV SOLN
10.0000 meq | INTRAVENOUS | Status: AC
  Administered 2023-02-02 (×4): 10 meq via INTRAVENOUS
  Filled 2023-02-02 (×4): qty 100

## 2023-02-02 NOTE — Progress Notes (Signed)
PROGRESS NOTE  Jake Gross BJY:[id number] DOB: 12/27/1940   PCP: Eden Emms, NP  Patient is from: Home.  Lives alone.  Uses rolling walker at baseline.  DOA: 02/01/2023 LOS: 1  Chief complaints Chief Complaint  Patient presents with   Weakness   Emesis     Brief Narrative / Interim history: 82 year old M with PMH of diastolic CHF, CKD-4, DM-2, HTN and recent hospitalization from 4/10-4/20 for nausea, vomiting and fall for which she was treated with IV hydration and antiemetics and returning with nausea and vomiting for about 7 days, generalized weakness and some shortness of breath.  Admitted for AKI on CKD-4 and generalized weakness and fall at home. Cr 3.4 (baseline 2.7).  CT also concerning for liver cirrhosis.   Subjective: Seen and examined earlier this morning.  No major events overnight of this morning.  No further nausea or vomiting.  Feels weak.  Denies abdominal pain, GI or UTI symptoms.  Denies chest pain or shortness of breath.  Objective: Vitals:   02/02/23 0830 02/02/23 0900 02/02/23 1030 02/02/23 1100  BP: (!) 158/76 91/66 139/77 (!) 150/85  Pulse: 81 87 62 60  Resp: (!) Temp:    (!) 97.5 F (36.4 C)  TempSrc:    Oral  SpO2: 95% 95% 97% 94%  Weight:      Height:        Examination:  GENERAL: No apparent distress.  Nontoxic. HEENT: MMM.  Vision and hearing grossly intact.  NECK: Supple.  No apparent JVD.  RESP:  No IWOB.  Fair aeration bilaterally. CVS:  RRR. Heart sounds normal.  ABD/GI/GU: BS+. Abd soft, NTND.  MSK/EXT:  Moves extremities. No apparent deformity. No edema.  SKIN: Skin bruises on bilateral arms and left leg.  See pictures for more. NEURO: Awake, alert and oriented x 4.  No apparent focal neuro deficit. PSYCH: Calm. Normal affect.   Procedures:  None  Microbiology summarized: COVID-19, influenza and RSV PCR nonreactive  Assessment and plan: Principal Problem:   Acute-on-chronic kidney injury Active  Problems:   Acute renal failure 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   Chronic diastolic CHF (congestive heart failure)   Paroxysmal atrial fibrillation   Diabetes mellitus type 2, insulin dependent   CAD (coronary artery disease)   Essential hypertension   HLD (hyperlipidemia)   Benign hypertensive kidney disease with chronic kidney disease  AKI on CKD-4/azotemia: Likely prerenal from GI loss and concurrent use of diuretics.  Baseline appears to be 2.7.  No signs of obstruction on CT.  Cr seems to have plateaued. Recent Labs    09/28/22 0345 09/29/22 0233 09/30/22 0124 10/02/22 0242 11/09/22 1633 01/24/23 1903 01/25/23 0417 01/26/23 0044 02/01/23 1720 02/02/23 0132  BUN 95* 88* 73* 62* 60* 97* 95* 85* 81* 80*  CREATININE 3.66* 2.92* 2.70* 2.26* 2.51* 3.39* 3.28* 2.71* 3.50* 3.40*  -Continue IV fluid hydration -Strict intake and output -Hold diuretics -Recheck in the morning -Discontinue Farxiga and Hygroton on discharge.   Intractable nausea and vomiting: Seems to have resolved.  Could be uremia and euglycemic DKA.  Patient denies alcohol. Abdominal exam benign.  Low suspicion for infection or inflammatory process although CT suggests mild proximal sigmoid diverticulitis.  No significant leukocytosis.  Received IV Zosyn in ED. -Monitor off antibiotics  Recurrent fall/generalized weakness: Patient lives alone.  Uses rolling walker at baseline.  Reportedly had a fall on 4/15.  Has bruises on his arms and left leg. -Fall precaution -  Check orthostatic vitals -PT/OT  Paroxysmal A-fib: Currently in sinus rhythm.  On metoprolol and low-dose Eliquis at home.  Has frequent falls. -Continue metoprolol -Continue Eliquis while in-house -Needs risk and benefit discussion of discharge about anticoagulation -Optimize electrolytes  Controlled IDDM-2 with hyperglycemia: A1c 6.4% on 11/09/2022 Recent Labs  Lab 01/26/23 1712 02/01/23 2338 02/02/23 0416 02/02/23 0746  02/02/23 1102  GLUCAP 169* 92 76 81 129*  -Continue current insulin regimen -Recheck hemoglobin A1c -Discontinue Farxiga on discharge  Cholelithiasis/mild hyperbilirubinemia/possible liver cirrhosis: Could be contributing to nausea and vomiting but symptoms have resolved now. -Continue monitoring -Outpatient follow-up  Prolonged QTc: QTc 530, and improved to 467 today. -Optimize electrolytes   History of CAD/elevated troponin: Elevated troponin likely demand ischemia and delayed clearance. -Continue home medications  Chronic diastolic CHF: Was dehydrated on exam.  Appears euvolemic this morning.  No cardiopulmonary symptoms. -Hold diuretics and adding of AKI -Strict intake and output, daily weights, renal functions and electrolytes  Essential hypertension: BP slightly elevated -Continue home medications. -P.o. hydralazine as needed   Hyperlipidemia -Continue Crestor.  Hypokalemia: Likely due to GI loss and Hygroton. -Monitor replenish as appropriate    Body mass index is 28.19 kg/m.  Pressure skin injury: Has a skin bruises on his arms and left leg from fall.  No signs of infection.  POA -Consulted wound care         DVT prophylaxis:  apixaban (ELIQUIS) tablet 2.5 mg Start: 02/01/23 2315 apixaban (ELIQUIS) tablet 2.5 mg  Code Status: Full code Family Communication: None at bedside Level of care: Med-Surg Status is: Inpatient Remains inpatient appropriate because: AKI, recurrent fall, hypokalemia and generalized weakness   Final disposition: TBD Consultants:  None  55 minutes with more than 50% spent in reviewing records, counseling patient/family and coordinating care.   Sch Meds:  Scheduled Meds:  amLODipine  5 mg Oral Daily   apixaban  2.5 mg Oral BID   hydrALAZINE  100 mg Oral TID   insulin aspart  0-15 Units Subcutaneous TID WC   insulin aspart  0-5 Units Subcutaneous QHS   insulin glargine-yfgn  50 Units Subcutaneous Daily   isosorbide  mononitrate  60 mg Oral Daily   metoprolol tartrate  37.5 mg Oral BID   tamsulosin  0.4 mg Oral Daily   Continuous Infusions:  sodium chloride 75 mL/hr at 02/02/23 1051   PRN Meds:.acetaminophen **OR** acetaminophen, prochlorperazine  Antimicrobials: Anti-infectives (From admission, onward)    Start     Dose/Rate Route Frequency Ordered Stop   02/02/23 0600  piperacillin-tazobactam (ZOSYN) IVPB 3.375 g        3.375 g 100 mL/hr over 30 Minutes Intravenous  Once 02/02/23 0557 02/02/23 0622        I have personally reviewed the following labs and images: CBC: Recent Labs  Lab 02/01/23 1720 02/01/23 2057 02/02/23 0132  WBC 11.7*  --  10.4  NEUTROABS  --   --  7.5  HGB 14.9 14.3 13.0  HCT 44.8 42.0 39.5  MCV 80.7  --  80.0  PLT 332  --  257   BMP &GFR Recent Labs  Lab 02/01/23 1720 02/01/23 2057 02/02/23 0132  NA 138 139 136  K 3.9 3.7 3.1*  CL 98  --  102  CO2 14*  --  19*  GLUCOSE 119*  --  80  BUN 81*  --  80*  CREATININE 3.50*  --  3.40*  CALCIUM 9.4  --  8.6*  MG  --   --  2.4   Estimated Creatinine Clearance: 17.7 mL/min (A) (by C-G formula based on SCr of 3.4 mg/dL (H)). Liver & Pancreas: Recent Labs  Lab 02/01/23 1720 02/02/23 0132  AST 26 18  ALT 13 12  ALKPHOS 93 83  BILITOT 1.5* 1.4*  PROT 7.6 6.7  ALBUMIN 3.3* 2.9*   Recent Labs  Lab 02/01/23 1720  LIPASE 40   No results for input(s): "AMMONIA" in the last 168 hours. Diabetic: No results for input(s): "HGBA1C" in the last 72 hours. Recent Labs  Lab 01/26/23 1712 02/01/23 2338 02/02/23 0416 02/02/23 0746 02/02/23 1102  GLUCAP 169* 92 76 81 129*   Cardiac Enzymes: No results for input(s): "CKTOTAL", "CKMB", "CKMBINDEX", "TROPONINI" in the last 168 hours. No results for input(s): "PROBNP" in the last 8760 hours. Coagulation Profile: No results for input(s): "INR", "PROTIME" in the last 168 hours. Thyroid Function Tests: No results for input(s): "TSH", "T4TOTAL", "FREET4",  "T3FREE", "THYROIDAB" in the last 72 hours. Lipid Profile: No results for input(s): "CHOL", "HDL", "LDLCALC", "TRIG", "CHOLHDL", "LDLDIRECT" in the last 72 hours. Anemia Panel: No results for input(s): "VITAMINB12", "FOLATE", "FERRITIN", "TIBC", "IRON", "RETICCTPCT" in the last 72 hours. Urine analysis:    Component Value Date/Time   COLORURINE YELLOW 02/01/2023 2252   APPEARANCEUR HAZY (A) 02/01/2023 2252   LABSPEC 1.010 02/01/2023 2252   PHURINE 5.0 02/01/2023 2252   GLUCOSEU 50 (A) 02/01/2023 2252   HGBUR NEGATIVE 02/01/2023 2252   BILIRUBINUR NEGATIVE 02/01/2023 2252   KETONESUR NEGATIVE 02/01/2023 2252   PROTEINUR 100 (A) 02/01/2023 2252   UROBILINOGEN 0.2 04/11/2015 1658   NITRITE NEGATIVE 02/01/2023 2252   LEUKOCYTESUR NEGATIVE 02/01/2023 2252   Sepsis Labs: Invalid input(s): "PROCALCITONIN", "LACTICIDVEN"  Microbiology: Recent Results (from the past 240 hour(s))  Resp panel by RT-PCR (RSV, Flu A&B, Covid) Anterior Nasal Swab     Status: None   Collection Time: 02/01/23  7:22 PM   Specimen: Anterior Nasal Swab  Result Value Ref Range Status   SARS Coronavirus 2 by RT PCR NEGATIVE NEGATIVE Final   Influenza A by PCR NEGATIVE NEGATIVE Final   Influenza B by PCR NEGATIVE NEGATIVE Final    Comment: (NOTE) The Xpert Xpress SARS-CoV-2/FLU/RSV plus assay is intended as an aid in the diagnosis of influenza from Nasopharyngeal swab specimens and should not be used as a sole basis for treatment. Nasal washings and aspirates are unacceptable for Xpert Xpress SARS-CoV-2/FLU/RSV testing.  Fact Sheet for Patients: BloggerCourse.com  Fact Sheet for Healthcare Providers: SeriousBroker.it  This test is not yet approved or cleared by the [place] FDA and has been authorized for detection and/or diagnosis of SARS-CoV-2 by FDA under an Emergency Use Authorization (EUA). This EUA will remain in effect (meaning this test can be  used) for the duration of the COVID-19 declaration under Section 564(b)(1) of the Act, 21 U.S.C. section 360bbb-3(b)(1), unless the authorization is terminated or revoked.     Resp Syncytial Virus by PCR NEGATIVE NEGATIVE Final    Comment: (NOTE) Fact Sheet for Patients: BloggerCourse.com  Fact Sheet for Healthcare Providers: SeriousBroker.it  This test is not yet approved or cleared by the [place] FDA and has been authorized for detection and/or diagnosis of SARS-CoV-2 by FDA under an Emergency Use Authorization (EUA). This EUA will remain in effect (meaning this test can be used) for the duration of the COVID-19 declaration under Section 564(b)(1) of the Act, 21 U.S.C. section 360bbb-3(b)(1), unless the authorization is terminated or revoked.  Performed at [facility]  Lab, [address], [place], [place] [address]     Radiology Studies: DG Chest Port 1 View  Result Date: 02/01/2023 CLINICAL DATA:  falls, emesis EXAM: PORTABLE CHEST 1 VIEW COMPARISON:  Chest x-ray 01/24/2023 FINDINGS: The heart and mediastinal contours are unchanged. Wireless cardiac device with chest. No focal consolidation. No pulmonary edema. No pleural effusion. No pneumothorax. No acute osseous abnormality.  Sternotomy wires are intact. IMPRESSION: No active disease. Electronically Signed   By: Tish Frederickson M.D.   On: 02/01/2023 19:43   CT Head Wo Contrast  Result Date: 02/01/2023 CLINICAL DATA:  Head trauma EXAM: CT HEAD WITHOUT CONTRAST CT CERVICAL SPINE WITHOUT CONTRAST TECHNIQUE: Multidetector CT imaging of the head and cervical spine was performed following the standard protocol without intravenous contrast. Multiplanar CT image reconstructions of the cervical spine were also generated. RADIATION DOSE REDUCTION: This exam was performed according to the departmental dose-optimization program which includes automated exposure control, adjustment  of the mA and/or kV according to patient size and/or use of iterative reconstruction technique. COMPARISON:  None Available. FINDINGS: CT HEAD FINDINGS Brain: There is no mass, hemorrhage or extra-axial collection. There is generalized atrophy without lobar predilection. There is hypoattenuation of the periventricular white matter, most commonly indicating chronic ischemic microangiopathy. Old left parietal infarct. Vascular: Atherosclerotic calcification of the vertebral and internal carotid arteries at the skull base. No abnormal hyperdensity of the major intracranial arteries or dural venous sinuses. Skull: The visualized skull base, calvarium and extracranial soft tissues are normal. Sinuses/Orbits: No fluid levels or advanced mucosal thickening of the visualized paranasal sinuses. No mastoid or middle ear effusion. The orbits are normal. CT CERVICAL SPINE FINDINGS Alignment: No static subluxation. Facets are aligned. Occipital condyles are normally positioned. Skull base and vertebrae: No acute fracture. Soft tissues and spinal canal: No prevertebral fluid or swelling. No visible canal hematoma. Disc levels: No advanced spinal canal or neural foraminal stenosis. Upper chest: No pneumothorax, pulmonary nodule or pleural effusion. Other: Normal visualized paraspinal cervical soft tissues. IMPRESSION: 1. No acute intracranial abnormality. 2. 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 3. No acute fracture or static subluxation of the cervical spine. Electronically Signed   By: Deatra Robinson M.D.   On: 02/01/2023 19:34   CT Cervical Spine Wo Contrast  Result Date: 02/01/2023 CLINICAL DATA:  Head trauma EXAM: CT HEAD WITHOUT CONTRAST CT CERVICAL SPINE WITHOUT CONTRAST TECHNIQUE: Multidetector CT imaging of the head and cervical spine was performed following the standard protocol without intravenous contrast. Multiplanar CT image reconstructions of the cervical spine were also generated.  RADIATION DOSE REDUCTION: This exam was performed according to the departmental dose-optimization program which includes automated exposure control, adjustment of the mA and/or kV according to patient size and/or use of iterative reconstruction technique. COMPARISON:  None Available. FINDINGS: CT HEAD FINDINGS Brain: There is no mass, hemorrhage or extra-axial collection. There is generalized atrophy without lobar predilection. There is hypoattenuation of the periventricular white matter, most commonly indicating chronic ischemic microangiopathy. Old left parietal infarct. Vascular: Atherosclerotic calcification of the vertebral and internal carotid arteries at the skull base. No abnormal hyperdensity of the major intracranial arteries or dural venous sinuses. Skull: The visualized skull base, calvarium and extracranial soft tissues are normal. Sinuses/Orbits: No fluid levels or advanced mucosal thickening of the visualized paranasal sinuses. No mastoid or middle ear effusion. The orbits are normal. CT CERVICAL SPINE FINDINGS Alignment: No static subluxation. Facets are aligned. Occipital condyles are normally positioned. Skull base and vertebrae: No  acute fracture. Soft tissues and spinal canal: No prevertebral fluid or swelling. No visible canal hematoma. Disc levels: No advanced spinal canal or neural foraminal stenosis. Upper chest: No pneumothorax, pulmonary nodule or pleural effusion. Other: Normal visualized paraspinal cervical soft tissues. IMPRESSION: 1. No acute intracranial abnormality. 2. 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 3. No acute fracture or static subluxation of the cervical spine. Electronically Signed   By: Deatra Robinson M.D.   On: 02/01/2023 19:34   CT ABDOMEN PELVIS WO CONTRAST  Result Date: 02/01/2023 CLINICAL DATA:  Abdominal pain, nausea/vomiting EXAM: CT ABDOMEN AND PELVIS WITHOUT CONTRAST TECHNIQUE: Multidetector CT imaging of the abdomen and pelvis was  performed following the standard protocol without IV contrast. RADIATION DOSE REDUCTION: This exam was performed according to the departmental dose-optimization program which includes automated exposure control, adjustment of the mA and/or kV according to patient size and/or use of iterative reconstruction technique. COMPARISON:  09/27/2022 FINDINGS: Lower chest: Lung bases are clear. Hepatobiliary: Cirrhosis. Large lamellated gallstone measuring 3.6 cm. No associated inflammatory changes. No intrahepatic or extrahepatic ductal dilatation. Pancreas: Within normal limits. Spleen: Within normal limits. Adrenals/Urinary Tract: Adrenal glands are within normal limits. Kidneys are within normal limits.  No hydronephrosis. Mildly thick-walled bladder, although underdistended. Stomach/Bowel: Stomach is within normal limits. No evidence of bowel obstruction. Normal appendix (series 3/image 85). Mild pericolonic inflammatory changes in the left lower quadrant suggesting very mild proximal sigmoid diverticulitis (series 3/image 84), without associated fluid collection/abscess. No free air to suggest macroscopic perforation. Vascular/Lymphatic: No evidence of abdominal aortic aneurysm. Atherosclerotic calcifications of the abdominal aorta and branch vessels. No suspicious abdominopelvic lymphadenopathy. Reproductive: Prostatomegaly, with enlargement of the central gland indenting the base of the bladder, suggesting BPH. Other: No abdominopelvic ascites. Musculoskeletal: Degenerative changes of the visualized thoracolumbar spine. IMPRESSION: Very mild proximal sigmoid diverticulitis, without associated fluid collection/abscess. No free air. Cholelithiasis, without associated inflammatory changes. Cirrhosis. Prostatomegaly, suggesting BPH. Electronically Signed   By: Charline Bills M.D.   On: 02/01/2023 19:16      Ashmi Blas T. Debara Gross [facility]  If 7PM-7AM, please contact night-coverage [email or web address] 02/02/2023,  11:16 AM

## 2023-02-02 NOTE — ED Notes (Signed)
ED TO INPATIENT HANDOFF REPORT  ED Nurse Name and Phone #: [phone]  S Name/Age/Gender Jake Gross 82 y.o. male Room/Bed: 037C/037C  Code Status   Code Status: Full Code  Home/SNF/Other Home Patient oriented to: self, place, time, and situation Is this baseline? Yes   Triage Complete: Triage complete  Chief Complaint Acute-on-chronic kidney injury [N17.9, N18.9]  Triage Note Pt bib ems from home; family reports past 3 days poor oral intake, along with n/v and generalized weakness; diagnosed with AKI last week, having multiple falls; none in the past day, but 3x over past week; decrease in urine output; pt denies abd pain, cp, sob; 100 cc fluid given pta; endorsed some dizziness with standing; 142/82, HR 90 regular, 98% RA, cbg 120, 12 lead NSR with PACs; pt denies pain currently   Allergies No Known Allergies  Level of Care/Admitting Diagnosis ED Disposition     ED Disposition  Admit   Condition  --   Comment  Hospital Area: [facility] [[id number]]  Level of Care: Med-Surg [16]  May admit patient to [facility] or [facility] if equivalent level of care is available:: No  Covid Evaluation: Confirmed COVID Negative  Diagnosis: Acute-on-chronic kidney injury [811914]  Admitting Physician: Gery Pray [[id number]]  Attending Physician: Gery Pray [[id number]]  Certification:: I certify this patient will need inpatient services for at least 2 midnights  Estimated Length of Stay: 2          B Medical/Surgery History 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    Arthritis    AVM (arteriovenous malformation) of small bowel, 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   CAD (coronary artery disease) 09/29/2019   Cervical spondylosis    Chronic anticoagulation    Chronic diastolic CHF (congestive heart  failure) 12/17/2018   CKD (chronic kidney disease) 12/17/2018   Diabetes mellitus    History of GI bleed 09/29/2019   Hyperlipidemia    Hypertension    Iron deficiency anemia due to chronic blood loss    Paroxysmal atrial fibrillation 12/17/2018   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 NO ISCHEMIA   COLONOSCOPY W/ POLYPECTOMY     5 polyps   COLONOSCOPY WITH PROPOFOL N/A 08/29/2019   Procedure: COLONOSCOPY WITH PROPOFOL;  Surgeon: Sherrilyn Rist, MD;  Location[facility] ENDOSCOPY;  Service: Gastroenterology;  Laterality: N/A;   CORONARY ARTERY BYPASS GRAFT N/A 11/26/2018   Procedure: CORONARY ARTERY BYPASS GRAFTING (CABG), ON PUMP, TIMES FOUR, USING LEFT INTERNAL MAMMARY ARTERY AND ENDOSCOPICALLY HARVESTED LEFT SAPHENOUS VEIN;  Surgeon: Alleen Borne, MD;  Location: [facility] OR;  Service: Open Heart Surgery;  Laterality: N/A;   ESOPHAGOGASTRODUODENOSCOPY (EGD) WITH PROPOFOL N/A 08/29/2019   Procedure: ESOPHAGOGASTRODUODENOSCOPY (EGD) WITH PROPOFOL;  Surgeon: Sherrilyn Rist, MD;  Location: [facility] ENDOSCOPY;  Service: Gastroenterology;  Laterality: N/A;   EUS N/A 03/11/2015   Procedure: LOWER ENDOSCOPIC ULTRASOUND (EUS);  Surgeon: Rachael Fee, MD;  Location: [facility] ENDOSCOPY;  Service: Endoscopy;  Laterality: N/A;   FLEXIBLE SIGMOIDOSCOPY N/A 02/02/2015   Procedure: FLEXIBLE SIGMOIDOSCOPY;  Surgeon: Louis Meckel, MD;  Location: [facility] ENDOSCOPY;  Service: Endoscopy;  Laterality: N/A;  ERBE   HEMOSTASIS CLIP PLACEMENT  08/29/2019   Procedure: HEMOSTASIS CLIP PLACEMENT;  Surgeon: Sherrilyn Rist, MD;  Location: [facility] ENDOSCOPY;  Service: Gastroenterology;;   HOT HEMOSTASIS N/A 08/29/2019   Procedure: HOT HEMOSTASIS (ARGON PLASMA COAGULATION/BICAP);  Surgeon: Sherrilyn Rist, MD;  Location: [facility] ENDOSCOPY;  Service:  Gastroenterology;  Laterality: N/A;   LEFT HEART CATH AND CORONARY ANGIOGRAPHY N/A 11/20/2018   Procedure: LEFT HEART CATH AND CORONARY ANGIOGRAPHY;  Surgeon: Kathleene Hazel, MD;  Location: [facility] INVASIVE CV LAB;  Service: Cardiovascular;  Laterality: N/A;   LOOP RECORDER INSERTION N/A 08/14/2017   Procedure: LOOP RECORDER INSERTION;  Surgeon: Hillis Range, MD;  Location: [facility] INVASIVE CV LAB;  Service: Cardiovascular;  Laterality: N/A;   PARTIAL PROCTECTOMY BY TEM N/A 04/08/2015   Procedure: TEM PARTIAL PROCTECTOMY OF RECTAL MASS;  Surgeon: Karie Soda, MD;  Location: [facility] ORS;  Service: General;  Laterality: N/A;   POLYPECTOMY  08/29/2019   Procedure: POLYPECTOMY;  Surgeon: Sherrilyn Rist, MD;  Location: [facility] ENDOSCOPY;  Service: Gastroenterology;;   TEE WITHOUT CARDIOVERSION N/A 06/25/2017   Procedure: TRANSESOPHAGEAL ECHOCARDIOGRAM (TEE);  Surgeon: Chilton Si, MD;  Location: [facility] ENDOSCOPY;  Service: Cardiovascular;  Laterality: N/A;   TEE WITHOUT CARDIOVERSION N/A 11/26/2018   Procedure: TRANSESOPHAGEAL ECHOCARDIOGRAM (TEE);  Surgeon: Alleen Borne, MD;  Location[facility] OR;  Service: Open Heart Surgery;  Laterality: N/A;   TONSILLECTOMY AND ADENOIDECTOMY     as child     A IV Location/Drains/Wounds Patient Lines/Drains/Airways Status     Active Line/Drains/Airways     Name Placement date Placement time Site Days   Peripheral IV 02/01/23 20 G Right;Posterior Hand 02/01/23  --  Hand  1            Intake/Output Last 24 hours  Intake/Output Summary (Last 24 hours) at 02/02/2023 0947 Last data filed at 02/02/2023 4403 Gross per 24 hour  Intake 1100 ml  Output 300 ml  Net 800 ml    Labs/Imaging Results for orders placed or performed during the hospital encounter of 02/01/23 (from the past 48 hour(s))  Lipase, blood     Status: None   Collection Time: 02/01/23  5:20 PM  Result Value Ref Range   Lipase 40 11 - 51 U/L    Comment: Performed at [facility],  [address], [place], [place] [address]  Comprehensive metabolic panel     Status: Abnormal   Collection Time: 02/01/23  5:20 PM  Result Value Ref Range   Sodium 138 135 - 145 mmol/L   Potassium 3.9 3.5 - 5.1 mmol/L   Chloride 98 98 - 111 mmol/L   CO2 14 (L) 22 - 32 mmol/L   Glucose, Bld 119 (H) 70 - 99 mg/dL    Comment: Glucose reference range applies only to samples taken after fasting for at least 8 hours.   BUN 81 (H) 8 - 23 mg/dL   Creatinine, Ser 9.56 (H) 0.61 - 1.24 mg/dL   Calcium 9.4 8.9 - 38.7 mg/dL   Total Protein 7.6 6.5 - 8.1 g/dL   Albumin 3.3 (L) 3.5 - 5.0 g/dL   AST 26 15 - 41 U/L   ALT 13 0 - 44 U/L   Alkaline Phosphatase 93 38 - 126 U/L   Total Bilirubin 1.5 (H) 0.3 - 1.2 mg/dL   GFR, Estimated 17 (L) >60 mL/min    Comment: (NOTE) Calculated using the CKD-EPI Creatinine Equation (2021)  Anion gap 26 (H) 5 - 15    Comment: ELECTROLYTES REPEATED TO VERIFY Performed at [facility], [address], [place], [place] [address]   CBC     Status: Abnormal   Collection Time: 02/01/23  5:20 PM  Result Value Ref Range   WBC 11.7 (H) 4.0 - 10.5 K/uL   RBC 5.55 4.22 - 5.81 MIL/uL   Hemoglobin 14.9 13.0 - 17.0 g/dL   HCT 62.1 30.8 - 65.7 %   MCV 80.7 80.0 - 100.0 fL   MCH 26.8 26.0 - 34.0 pg   MCHC 33.3 30.0 - 36.0 g/dL   RDW 84.6 96.2 - 95.2 %   Platelets 332 150 - 400 K/uL   nRBC 0.0 0.0 - 0.2 %    Comment: Performed at [facility], [address], [place], [place] [address]  Troponin I (High Sensitivity)     Status: Abnormal   Collection Time: 02/01/23  5:20 PM  Result Value Ref Range   Troponin I (High Sensitivity) 67 (H) <18 ng/L    Comment: (NOTE) Elevated high sensitivity troponin I (hsTnI) values and significant  changes across serial measurements may suggest ACS but many other  chronic and acute conditions are known to elevate hsTnI results.  Refer to the "Links" section for chest pain algorithms and additional  guidance. Performed at  [name], [address], [place], [place] [address]   Resp panel by RT-PCR (RSV, Flu A&B, Covid) Anterior Nasal Swab     Status: None   Collection Time: 02/01/23  7:22 PM   Specimen: Anterior Nasal Swab  Result Value Ref Range   SARS Coronavirus 2 by RT PCR NEGATIVE NEGATIVE   Influenza A by PCR NEGATIVE NEGATIVE   Influenza B by PCR NEGATIVE NEGATIVE    Comment: (NOTE) The Xpert Xpress SARS-CoV-2/FLU/RSV plus assay is intended as an aid in the diagnosis of influenza from Nasopharyngeal swab specimens and should not be used as a sole basis for treatment. Nasal washings and aspirates are unacceptable for Xpert Xpress SARS-CoV-2/FLU/RSV testing.  Fact Sheet for Patients: BloggerCourse.com  Fact Sheet for Healthcare Providers: SeriousBroker.it  This test is not yet approved or cleared by the [place] FDA and has been authorized for detection and/or diagnosis of SARS-CoV-2 by FDA under an Emergency Use Authorization (EUA). This EUA will remain in effect (meaning this test can be used) for the duration of the COVID-19 declaration under Section 564(b)(1) of the Act, 21 U.S.C. section 360bbb-3(b)(1), unless the authorization is terminated or revoked.     Resp Syncytial Virus by PCR NEGATIVE NEGATIVE    Comment: (NOTE) Fact Sheet for Patients: BloggerCourse.com  Fact Sheet for Healthcare Providers: SeriousBroker.it  This test is not yet approved or cleared by the [place] FDA and has been authorized for detection and/or diagnosis of SARS-CoV-2 by FDA under an Emergency Use Authorization (EUA). This EUA will remain in effect (meaning this test can be used) for the duration of the COVID-19 declaration under Section 564(b)(1) of the Act, 21 U.S.C. section 360bbb-3(b)(1), unless the authorization is terminated or revoked.  Performed at [facility],  [address], [place], [place] [address]   Troponin I (High Sensitivity)     Status: Abnormal   Collection Time: 02/01/23  8:53 PM  Result Value Ref Range   Troponin I (High Sensitivity) 68 (H) <18 ng/L    Comment: (NOTE) Elevated high sensitivity troponin I (hsTnI) values and significant  changes  across serial measurements may suggest ACS but many other  chronic and acute conditions are known to elevate hsTnI results.  Refer to the "Links" section for chest pain algorithms and additional  guidance. Performed at [facility], [address], [place], [place] [address]   Salicylate level     Status: Abnormal   Collection Time: 02/01/23  8:53 PM  Result Value Ref Range   Salicylate Lvl <7.0 (L) 7.0 - 30.0 mg/dL    Comment: Performed at [facility], [address], [place], [place] [address]  Ethanol     Status: None   Collection Time: 02/01/23  8:53 PM  Result Value Ref Range   Alcohol, Ethyl (B) <10 <10 mg/dL    Comment: (NOTE) Lowest detectable limit for serum alcohol is 10 mg/dL.  For medical purposes only. Performed at [facility], [address] [place], [place] [address]   Lactic acid, plasma     Status: None   Collection Time: 02/01/23  8:53 PM  Result Value Ref Range   Lactic Acid, Venous 1.5 0.5 - 1.9 mmol/L    Comment: Performed at [facility], [address], [place], [place] [address]  I-Stat venous blood gas, [facility] ED, [facility], [facility])     Status: Abnormal   Collection Time: 02/01/23  8:57 PM  Result Value Ref Range   pH, Ven 7.397 7.25 - 7.43   pCO2, Ven 32.8 (L) 44 - 60 mmHg   pO2, Ven 36 32 - 45 mmHg   Bicarbonate 20.2 20.0 - 28.0 mmol/L   TCO2 21 (L) 22 - 32 mmol/L   O2 Saturation 69 %   Acid-base deficit 4.0 (H) 0.0 - 2.0 mmol/L   Sodium 139 135 - 145 mmol/L   Potassium 3.7 3.5 - 5.1 mmol/L   Calcium, Ion 1.09 (L) 1.15 - 1.40 mmol/L   HCT 42.0 39.0 - 52.0 %   Hemoglobin 14.3 13.0 - 17.0 g/dL   Sample type VENOUS    Comment NOTIFIED  PHYSICIAN   Lactic acid, plasma     Status: None   Collection Time: 02/01/23 10:30 PM  Result Value Ref Range   Lactic Acid, Venous 1.4 0.5 - 1.9 mmol/L    Comment: Performed at [facility], [address] [place], [place] [address]  Urinalysis, w/ Reflex to Culture (Infection Suspected) -Urine, Clean Catch     Status: Abnormal   Collection Time: 02/01/23 10:52 PM  Result Value Ref Range   Specimen Source URINE, CLEAN CATCH    Color, Urine YELLOW YELLOW   APPearance HAZY (A) CLEAR   Specific Gravity, Urine 1.010 1.005 - 1.030   pH 5.0 5.0 - 8.0   Glucose, UA 50 (A) NEGATIVE mg/dL   Hgb urine dipstick NEGATIVE NEGATIVE   Bilirubin Urine NEGATIVE NEGATIVE   Ketones, ur NEGATIVE NEGATIVE mg/dL   Protein, ur 130 (A) NEGATIVE mg/dL   Nitrite NEGATIVE NEGATIVE   Leukocytes,Ua NEGATIVE NEGATIVE   RBC / HPF 0-5 0 - 5 RBC/hpf   WBC, UA 0-5 0 - 5 WBC/hpf    Comment:        Reflex urine culture not performed if WBC <=10, OR if Squamous epithelial cells >5. If Squamous epithelial cells >5 suggest recollection.    Bacteria, UA NONE SEEN NONE SEEN   Squamous Epithelial / HPF 0-5 0 - 5 /HPF   Mucus PRESENT     Comment: Performed at [facility], [address] [place], [place] [address]  CBG monitoring, ED     Status: None   Collection Time: 02/01/23 11:38 PM  Result Value Ref Range   Glucose-Capillary 92 70 - 99 mg/dL    Comment: Glucose reference range applies only to samples taken after fasting for at least 8 hours.  Magnesium     Status: None   Collection Time: 02/02/23  1:32 AM  Result Value Ref Range   Magnesium 2.4 1.7 - 2.4 mg/dL    Comment: Performed at [facility], [address], [place], [place] [address]  Comprehensive metabolic panel     Status: Abnormal   Collection Time: 02/02/23  1:32 AM  Result Value Ref Range   Sodium 136 135 - 145 mmol/L   Potassium 3.1 (L) 3.5 - 5.1 mmol/L   Chloride 102 98 - 111 mmol/L   CO2 19 (L) 22 - 32 mmol/L    Glucose, Bld 80 70 - 99 mg/dL    Comment: Glucose reference range applies only to samples taken after fasting for at least 8 hours.   BUN 80 (H) 8 - 23 mg/dL   Creatinine, Ser 9.62 (H) 0.61 - 1.24 mg/dL   Calcium 8.6 (L) 8.9 - 10.3 mg/dL   Total Protein 6.7 6.5 - 8.1 g/dL   Albumin 2.9 (L) 3.5 - 5.0 g/dL   AST 18 15 - 41 U/L   ALT 12 0 - 44 U/L   Alkaline Phosphatase 83 38 - 126 U/L   Total Bilirubin 1.4 (H) 0.3 - 1.2 mg/dL   GFR, Estimated 18 (L) >60 mL/min    Comment: (NOTE) Calculated using the CKD-EPI Creatinine Equation (2021)    Anion gap 15 5 - 15    Comment: Performed at [facility], [address], [place], [place] [address]  CBC with Differential/Platelet     Status: None   Collection Time: 02/02/23  1:32 AM  Result Value Ref Range   WBC 10.4 4.0 - 10.5 K/uL   RBC 4.94 4.22 - 5.81 MIL/uL   Hemoglobin 13.0 13.0 - 17.0 g/dL   HCT 13.2 44.0 - 10.2 %   MCV 80.0 80.0 - 100.0 fL   MCH 26.3 26.0 - 34.0 pg   MCHC 32.9 30.0 - 36.0 g/dL   RDW 72.5 36.6 - 44.0 %   Platelets 257 150 - 400 K/uL   nRBC 0.0 0.0 - 0.2 %   Neutrophils Relative % 71 %   Neutro Abs 7.5 1.7 - 7.7 K/uL   Lymphocytes Relative 17 %   Lymphs Abs 1.7 0.7 - 4.0 K/uL   Monocytes Relative 10 %   Monocytes Absolute 1.0 0.1 - 1.0 K/uL   Eosinophils Relative 1 %   Eosinophils Absolute 0.1 0.0 - 0.5 K/uL   Basophils Relative 1 %   Basophils Absolute 0.1 0.0 - 0.1 K/uL   Immature Granulocytes 0 %   Abs Immature Granulocytes 0.04 0.00 - 0.07 K/uL    Comment: Performed at [facility], [address] [place], [place] [address]  CBG monitoring, ED     Status: None   Collection Time: 02/02/23  4:16 AM  Result Value Ref Range   Glucose-Capillary 76 70 - 99 mg/dL    Comment: Glucose reference range applies only to samples taken after fasting for at least 8 hours.  CBG monitoring, ED     Status: None   Collection Time: 02/02/23  7:46 AM  Result Value Ref Range  Glucose-Capillary 81 70 - 99 mg/dL     Comment: Glucose reference range applies only to samples taken after fasting for at least 8 hours.   DG Chest Port 1 View  Result Date: 02/01/2023 CLINICAL DATA:  falls, emesis EXAM: PORTABLE CHEST 1 VIEW COMPARISON:  Chest x-ray 01/24/2023 FINDINGS: The heart and mediastinal contours are unchanged. Wireless cardiac device with chest. No focal consolidation. No pulmonary edema. No pleural effusion. No pneumothorax. No acute osseous abnormality.  Sternotomy wires are intact. IMPRESSION: No active disease. Electronically Signed   By: Tish Frederickson M.D.   On: 02/01/2023 19:43   CT Head Wo Contrast  Result Date: 02/01/2023 CLINICAL DATA:  Head trauma EXAM: CT HEAD WITHOUT CONTRAST CT CERVICAL SPINE WITHOUT CONTRAST TECHNIQUE: Multidetector CT imaging of the head and cervical spine was performed following the standard protocol without intravenous contrast. Multiplanar CT image reconstructions of the cervical spine were also generated. RADIATION DOSE REDUCTION: This exam was performed according to the departmental dose-optimization program which includes automated exposure control, adjustment of the mA and/or kV according to patient size and/or use of iterative reconstruction technique. COMPARISON:  None Available. FINDINGS: CT HEAD FINDINGS Brain: There is no mass, hemorrhage or extra-axial collection. There is generalized atrophy without lobar predilection. There is hypoattenuation of the periventricular white matter, most commonly indicating chronic ischemic microangiopathy. Old left parietal infarct. Vascular: Atherosclerotic calcification of the vertebral and internal carotid arteries at the skull base. No abnormal hyperdensity of the major intracranial arteries or dural venous sinuses. Skull: The visualized skull base, calvarium and extracranial soft tissues are normal. Sinuses/Orbits: No fluid levels or advanced mucosal thickening of the visualized paranasal sinuses. No mastoid or middle ear effusion.  The orbits are normal. CT CERVICAL SPINE FINDINGS Alignment: No static subluxation. Facets are aligned. Occipital condyles are normally positioned. Skull base and vertebrae: No acute fracture. Soft tissues and spinal canal: No prevertebral fluid or swelling. No visible canal hematoma. Disc levels: No advanced spinal canal or neural foraminal stenosis. Upper chest: No pneumothorax, pulmonary nodule or pleural effusion. Other: Normal visualized paraspinal cervical soft tissues. IMPRESSION: 1. No acute intracranial abnormality. 2. 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 3. No acute fracture or static subluxation of the cervical spine. Electronically Signed   By: Deatra Robinson M.D.   On: 02/01/2023 19:34   CT Cervical Spine Wo Contrast  Result Date: 02/01/2023 CLINICAL DATA:  Head trauma EXAM: CT HEAD WITHOUT CONTRAST CT CERVICAL SPINE WITHOUT CONTRAST TECHNIQUE: Multidetector CT imaging of the head and cervical spine was performed following the standard protocol without intravenous contrast. Multiplanar CT image reconstructions of the cervical spine were also generated. RADIATION DOSE REDUCTION: This exam was performed according to the departmental dose-optimization program which includes automated exposure control, adjustment of the mA and/or kV according to patient size and/or use of iterative reconstruction technique. COMPARISON:  None Available. FINDINGS: CT HEAD FINDINGS Brain: There is no mass, hemorrhage or extra-axial collection. There is generalized atrophy without lobar predilection. There is hypoattenuation of the periventricular white matter, most commonly indicating chronic ischemic microangiopathy. Old left parietal infarct. Vascular: Atherosclerotic calcification of the vertebral and internal carotid arteries at the skull base. No abnormal hyperdensity of the major intracranial arteries or dural venous sinuses. Skull: The visualized skull base, calvarium and extracranial soft  tissues are normal. Sinuses/Orbits: No fluid levels or advanced mucosal thickening of the visualized paranasal sinuses. No mastoid or middle ear effusion. The orbits are normal. CT CERVICAL SPINE FINDINGS Alignment: No static  subluxation. Facets are aligned. Occipital condyles are normally positioned. Skull base and vertebrae: No acute fracture. Soft tissues and spinal canal: No prevertebral fluid or swelling. No visible canal hematoma. Disc levels: No advanced spinal canal or neural foraminal stenosis. Upper chest: No pneumothorax, pulmonary nodule or pleural effusion. Other: Normal visualized paraspinal cervical soft tissues. IMPRESSION: 1. No acute intracranial abnormality. 2. 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 3. No acute fracture or static subluxation of the cervical spine. Electronically Signed   By: Deatra Robinson M.D.   On: 02/01/2023 19:34   CT ABDOMEN PELVIS WO CONTRAST  Result Date: 02/01/2023 CLINICAL DATA:  Abdominal pain, nausea/vomiting EXAM: CT ABDOMEN AND PELVIS WITHOUT CONTRAST TECHNIQUE: Multidetector CT imaging of the abdomen and pelvis was performed following the standard protocol without IV contrast. RADIATION DOSE REDUCTION: This exam was performed according to the departmental dose-optimization program which includes automated exposure control, adjustment of the mA and/or kV according to patient size and/or use of iterative reconstruction technique. COMPARISON:  09/27/2022 FINDINGS: Lower chest: Lung bases are clear. Hepatobiliary: Cirrhosis. Large lamellated gallstone measuring 3.6 cm. No associated inflammatory changes. No intrahepatic or extrahepatic ductal dilatation. Pancreas: Within normal limits. Spleen: Within normal limits. Adrenals/Urinary Tract: Adrenal glands are within normal limits. Kidneys are within normal limits.  No hydronephrosis. Mildly thick-walled bladder, although underdistended. Stomach/Bowel: Stomach is within normal limits. No evidence  of bowel obstruction. Normal appendix (series 3/image 85). Mild pericolonic inflammatory changes in the left lower quadrant suggesting very mild proximal sigmoid diverticulitis (series 3/image 84), without associated fluid collection/abscess. No free air to suggest macroscopic perforation. Vascular/Lymphatic: No evidence of abdominal aortic aneurysm. Atherosclerotic calcifications of the abdominal aorta and branch vessels. No suspicious abdominopelvic lymphadenopathy. Reproductive: Prostatomegaly, with enlargement of the central gland indenting the base of the bladder, suggesting BPH. Other: No abdominopelvic ascites. Musculoskeletal: Degenerative changes of the visualized thoracolumbar spine. IMPRESSION: Very mild proximal sigmoid diverticulitis, without associated fluid collection/abscess. No free air. Cholelithiasis, without associated inflammatory changes. Cirrhosis. Prostatomegaly, suggesting BPH. Electronically Signed   By: Charline Bills M.D.   On: 02/01/2023 19:16    Pending Labs Unresulted Labs (From admission, onward)    None       Vitals/Pain Today's Vitals   02/02/23 0744 02/02/23 0827 02/02/23 0830 02/02/23 0900  BP:  (!) 157/76 (!) 158/76 91/66  Pulse:  83 81 87  Resp:   (!) 22 17  Temp:      TempSrc:      SpO2:   95% 95%  Weight:      Height:      PainSc: 0-No pain       Isolation Precautions No active isolations  Medications Medications  prochlorperazine (COMPAZINE) injection 10 mg (has no administration in time range)  acetaminophen (TYLENOL) tablet 650 mg (has no administration in time range)    Or  acetaminophen (TYLENOL) suppository 650 mg (has no administration in time range)  0.9 %  sodium chloride infusion ( Intravenous New Bag/Given 02/01/23 2334)  apixaban (ELIQUIS) tablet 2.5 mg (2.5 mg Oral Given 02/02/23 0828)  amLODipine (NORVASC) tablet 5 mg (5 mg Oral Given 02/02/23 0831)  isosorbide mononitrate (IMDUR) 24 hr tablet 60 mg (has no administration in  time range)  tamsulosin (FLOMAX) capsule 0.4 mg (0.4 mg Oral Given 02/02/23 0831)  hydrALAZINE (APRESOLINE) tablet 100 mg (100 mg Oral Given 02/02/23 0827)  metoprolol tartrate (LOPRESSOR) tablet 37.5 mg (37.5 mg Oral Given 02/02/23 0829)  insulin aspart (novoLOG) injection 0-15 Units ( Subcutaneous  Not Given 02/02/23 0746)  insulin aspart (novoLOG) injection 0-5 Units ( Subcutaneous Not Given 02/01/23 2338)  insulin glargine-yfgn (SEMGLEE) injection 50 Units (has no administration in time range)  potassium chloride 10 mEq in 100 mL IVPB (0 mEq Intravenous Stopped 02/02/23 0940)  ondansetron (ZOFRAN) injection 4 mg (4 mg Intravenous Given 02/01/23 1928)  sodium chloride 0.9 % bolus 1,000 mL (0 mLs Intravenous Stopped 02/01/23 2225)  piperacillin-tazobactam (ZOSYN) IVPB 3.375 g (0 g Intravenous Stopped 02/02/23 0622)  potassium chloride SA (KLOR-CON M) CR tablet 40 mEq (40 mEq Oral Given 02/02/23 0740)    Mobility walks with device     Focused Assessments Cardiac Assessment Handoff:  Cardiac Rhythm: Atrial fibrillation Lab Results  Component Value Date   CKTOTAL 166 01/24/2023   CKMB 7.6 (H) 11/12/2018   TROPONINI <0.03 03/25/2018   No results found for: "DDIMER" Does the Patient currently have chest pain? No    R Recommendations: See Admitting Provider Note  Report given to:   Additional Notes:

## 2023-02-02 NOTE — Evaluation (Signed)
Physical Therapy Evaluation Patient Details Name: Jake Gross MRN: [id number] DOB: August 01, 1941 Today's Date: 02/02/2023  History of Present Illness  Pt is an 82 yo male presenting to [facility] ED on 4/10 via EMS due to fall. He has had nausea/vomiting for 4 months and has had progressive weakness over the past 3 days. 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  PMH: DM, HTN, colon cancer, CVA, HLD, CHF, renal disease.   Clinical Impression  Jake Gross is 82 y.o. male admitted with above HPI and diagnosis. Patient is currently limited by functional impairments below (see PT problem list). Patient lives alone and is independent with RW at baseline. Patient current;ly requires min assist for bed mobility and stand pivot transfers with HHA to move bed<>BSC. Pt limited this eval by continuous loose BM. Pt's BP noted to be hypotensive with bed>BSC transfer and recovered once back to EOB and returned to supine. Pt denied symptoms of Hypotension throughout. Patient will benefit from continued skilled PT interventions to address impairments and progress independence with mobility, recommending follow up post acute rehab. Acute PT will follow and progress as able.     Vitals: all taken seated EOB     02/02/23 0845 02/02/23  0857 02/02/23  0903 02/02/23  0915  BP: 154/130 (!) 91/66 113/69 122/63   Supine prior to mobilizing EOB Following 1st transfer bed>BSC Following transfer BSC>bed Once returned to supine        Recommendations for follow up therapy are one component of a multi-disciplinary discharge planning process, led by the attending physician.  Recommendations may be updated based on patient status, additional functional criteria and insurance authorization.  Follow Up Recommendations Can patient physically be transported by private vehicle: No     Assistance Recommended at Discharge Frequent or constant Supervision/Assistance  Patient can return home with the following  A lot of help with  walking and/or transfers;A lot of help with bathing/dressing/bathroom;Assistance with cooking/housework;Direct supervision/assist for medications management;Assist for transportation;Help with stairs or ramp for entrance    Equipment Recommendations None recommended by PT (TBD)  Recommendations for Other Services       Functional Status Assessment Patient has had a recent decline in their functional status and demonstrates the ability to make significant improvements in function in a reasonable and predictable amount of time.     Precautions / Restrictions Precautions Precautions: Fall Precaution Comments: 3 falls last week Restrictions Weight Bearing Restrictions: No      Mobility  Bed Mobility Overal bed mobility: Needs Assistance Bed Mobility: Supine to Sit, Sit to Supine     Supine to sit: Min assist, HOB elevated Sit to supine: Min guard, HOB elevated   General bed mobility comments: Min assist to seuqence and stabilize with supine>sit, assist to raise trunk fully. pt able to sit EOS with bil UE support. Pt able to bring bil LE's onto bed to return to supine with min guard. completed 2x due to repeated need for [facility].    Transfers Overall transfer level: Needs assistance Equipment used: 1 person hand held assist Transfers: Sit to/from Stand, Bed to chair/wheelchair/BSC Sit to Stand: Min assist Stand pivot transfers: Min assist         General transfer comment: Pt with wide BOS and min assist required to steady/stabilize balance. Pt with incontinent BM leaking during trasnfer. pt completed 2x bed<>BSC wtih min HHA to steady and guide steps.    Ambulation/Gait  General Gait Details: deferred due to frequent loose and leaking BM  Stairs            Wheelchair Mobility    Modified Rankin (Stroke Patients Only)       Balance Overall balance assessment: Needs assistance Sitting-balance support: Feet supported, Bilateral upper extremity  supported Sitting balance-Leahy Scale: Fair Sitting balance - Comments: posterior lean with LE testing at EOB Postural control: Posterior lean Standing balance support: Bilateral upper extremity supported, During functional activity Standing balance-Leahy Scale: Poor Standing balance comment: reliant on external support                             Pertinent Vitals/Pain Pain Assessment Pain Assessment: No/denies pain    Home Living Family/patient expects to be discharged to:: Private residence Living Arrangements: Alone Available Help at Discharge: Family;Available PRN/intermittently (son and daughter) Type of Home: House Home Access: Stairs to enter Entrance Stairs-Rails: Doctor, general practice of Steps: 5   Home Layout: One level Home Equipment: Cane - single point;Cane - Programmer, applications (2 wheels) Additional Comments: Pt reports the cane they made themself    Prior Function Prior Level of Function : Independent/Modified Independent;Driving;History of Falls (last six months)             Mobility Comments: Pt was ambulating with RW. He states that he uses it when he goes out in the community and in his home. 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 He had 4 falls in one day which is why he is here but otherwise denies falls. ADLs Comments: independent     Hand Dominance   Dominant Hand: Right    Extremity/Trunk Assessment   Upper Extremity Assessment Upper Extremity Assessment: Defer to OT evaluation    Lower Extremity Assessment Lower Extremity Assessment: RLE deficits/detail;LLE deficits/detail;Generalized weakness RLE Deficits / Details: heel to shin slightly limited than Lt, rigid/not smooth RLE Sensation: WNL RLE Coordination: decreased gross motor LLE Deficits / Details: heel to shin slightly limited than Lt, rigid/not smooth LLE Sensation: WNL LLE Coordination: decreased gross motor;decreased fine motor     Cervical / Trunk Assessment Cervical / Trunk Assessment: Normal  Communication   Communication: No difficulties  Cognition Arousal/Alertness: Awake/alert Behavior During Therapy: WFL for tasks assessed/performed Overall Cognitive Status: Within Functional Limits for tasks assessed                                 General Comments: slight decrease in safety awareness and memory, but pt reports his memory has been like this for a while. Able to perform simple money management        General Comments      Exercises     Assessment/Plan    PT Assessment Patient needs continued PT services  PT Problem List Decreased strength;Decreased mobility;Decreased balance;Decreased activity tolerance;Decreased coordination;Decreased knowledge of use of DME;Decreased safety awareness;Decreased knowledge of precautions;Obesity;Decreased skin integrity       PT Treatment Interventions DME instruction;Therapeutic activities;Gait training;Therapeutic exercise;Patient/family education;Stair training;Balance training;Functional mobility training    PT Goals (Current goals can be found in the Care Plan section)  Acute Rehab PT Goals Patient Stated Goal: to stop falling and get better PT Goal Formulation: With patient Time For Goal Achievement: 02/16/23 Potential to Achieve Goals: Fair    Frequency Min 2X/week     Co-evaluation  AM-PAC PT "6 Clicks" Mobility  Outcome Measure Help needed turning from your back to your side while in a flat bed without using bedrails?: A Little Help needed moving from lying on your back to sitting on the side of a flat bed without using bedrails?: A Little Help needed moving to and from a bed to a chair (including a wheelchair)?: A Little Help needed standing up from a chair using your arms (e.g., wheelchair or bedside chair)?: A Little Help needed to walk in hospital room?: A Lot Help needed climbing 3-5 steps with a railing? :  Total 6 Click Score: 15    End of Session Equipment Utilized During Treatment: Gait belt Activity Tolerance: Patient tolerated treatment well (limited by bowel incontinence) Patient left: in bed;with call bell/phone within reach Nurse Communication: Mobility status PT Visit Diagnosis: Unsteadiness on feet (R26.81);Muscle weakness (generalized) (M62.81);History of falling (Z91.81);Repeated falls (R29.6);Difficulty in walking, not elsewhere classified (R26.2)    Time: 1610-9604 PT Time Calculation (min) (ACUTE ONLY): 38 min   Charges:   PT Evaluation $PT Eval Moderate Complexity: 1 Mod PT Treatments $Therapeutic Activity: 23-37 mins       Wynn Maudlin, DPT Acute Rehabilitation Services Office [phone]  02/02/23 11:42 AM

## 2023-02-02 NOTE — Consult Note (Addendum)
WOC Nurse Consult Note: Reason for Consult: multiple bruises to arms, legs  Wound type: traumatic  Pressure Injury POA: NA Measurement: 1.  L knee 2 cms x 2 cms full thickness wound 100% serous yellow brown crust, dry, L medial knee 3 cms x 2 cms 100% serous crust brown yellow, L lateral knee 1 cm x 5 cms 100% serous crust dry brown  2.  R lateral arm 2 cms x 1.5 cms 100% bloody crust, R wrist 1 cm x 2 cm 100% bloody crust, R elbow 1 cm x 2 cm 100% bloody crust; L elbow 2 cms x 2 cms 100%  bloody crust   Drainage (amount, consistency, odor)  L knee and L elbow dry, R arm wounds with minimal serosanguinous drainage  Periwound: scattered bruises and other abrasions noted (patient states he is on a blood thinner)  Dressing procedure/placement/frequency: Clean all wounds with NS, apply Xeroform gauze [place] [phone]) cut to fit wound beds daily. Cover with silicone foam or Telfa and Kerlix whichever works best.  May lift foam dressings to replace Xeroform.  Change foam dressings q3 days and prn soiling.   POC discussed with patient and bedside nurse.    WOC team will not follow at this time. Re-consult if further needs arise.   Thank you,    Priscella Mann MSN, RN-BC, [employer] [phone]

## 2023-02-02 NOTE — [place] FL2 (Addendum)
[place] MEDICAID FL2 LEVEL OF CARE FORM     IDENTIFICATION  Patient Name: Jake Gross Birthdate: [birth year] Sex: male Admission Date (Current Location): 02/01/2023  [facility] and [place] Number:  Producer, television/film/video and Address:  [facility], [address], [place], [place] [address]      Provider Number: [id number]  Attending Physician Name and Address:  Almon Hercules, MD  Relative Name and Phone Number:       Current Level of Care: Hospital Recommended Level of Care: Skilled Nursing Facility Prior Approval Number:    Date Approved/Denied:   PASRR Number: [id number]   Discharge Plan: SNF    Current Diagnoses: Patient Active Problem List   Diagnosis Date Noted   Acute-on-chronic kidney injury 02/01/2023   AKI (acute kidney injury) 01/25/2023   Acute renal failure 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 09/28/2022   Elevated troponin 09/28/2022   Lactic acidosis 09/28/2022   Prolonged QT interval 09/28/2022   Medicare annual wellness visit, subsequent 10/29/2021   Abnormal SPEP 09/12/2021   Dyspnea 06/27/2021   Abscess 06/27/2021   History of anemia 06/27/2021   Nocturia 06/27/2021   Benign hypertensive kidney disease with chronic kidney disease 12/22/2019   Proteinuria 12/22/2019   Stage 3b chronic kidney disease 12/22/2019   Type 2 diabetes mellitus with diabetic chronic kidney disease 12/22/2019   Hypokalemia 12/22/2019   History of GI bleed 09/29/2019   CAD (coronary artery disease) 09/29/2019   AVM (arteriovenous malformation) of small bowel, 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   Iron deficiency anemia due to chronic blood loss    Chronic anticoagulation    History of CVA (cerebrovascular accident) 08/25/2019   Chronic diastolic CHF (congestive heart failure) 12/17/2018   Paroxysmal atrial fibrillation 12/17/2018   CKD (chronic kidney disease) 12/17/2018   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   Unstable angina    Stroke  (cerebrum) 06/20/2017   HLD (hyperlipidemia) 09/14/2015   Essential hypertension    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    Arthritis 09/16/2012   Diabetes mellitus type 2, insulin dependent 08/18/2011    Orientation RESPIRATION BLADDER Height & Weight     Self, Time, Situation, Place  Normal Continent Weight: 180 lb (81.6 kg) Height:  5\' 7"  (170.2 cm)  BEHAVIORAL SYMPTOMS/MOOD NEUROLOGICAL BOWEL NUTRITION STATUS      Continent Diet (see d/c summarry)  AMBULATORY STATUS COMMUNICATION OF NEEDS Skin   Limited Assist Verbally Normal                       Personal Care Assistance Level of Assistance  Bathing, Feeding Bathing Assistance: Limited assistance Feeding assistance: Independent       Functional Limitations Info  Sight, Speech, Hearing Sight Info: Adequate Hearing Info: Adequate Speech Info: Adequate    SPECIAL CARE FACTORS FREQUENCY  PT (By licensed PT), OT (By licensed OT)     PT Frequency: 5x-wk OT Frequency: 3x-wk            Contractures Contractures Info: Not present    Additional Factors Info  Code Status, Allergies Code Status Info: Full code Allergies Info: Full code           Current Medications (02/02/2023):  This is the current hospital active medication list Current Facility-Administered Medications  Medication Dose Route Frequency Provider Last Rate Last Admin   0.9 %  sodium chloride infusion   Intravenous Continuous Gery Pray, MD 75 mL/hr  at 02/02/23 1051 Infusion Verify at 02/02/23 1051   acetaminophen (TYLENOL) tablet 650 mg  650 mg Oral Q6H PRN Gery Pray, MD       Or   acetaminophen (TYLENOL) suppository 650 mg  650 mg Rectal Q6H PRN Crosley, Debby, MD       amLODipine (NORVASC) tablet 5 mg  5 mg Oral Daily Crosley, Debby, MD   5 mg at 02/02/23 0831   apixaban (ELIQUIS) tablet 2.5 mg  2.5 mg Oral BID Gery Pray, MD   2.5 mg at 02/02/23 1610   hydrALAZINE (APRESOLINE) tablet 50 mg   50 mg Oral TID Candelaria Stagers T, MD       insulin aspart (novoLOG) injection 0-15 Units  0-15 Units Subcutaneous TID WC Crosley, Debby, MD   2 Units at 02/02/23 1628   insulin aspart (novoLOG) injection 0-5 Units  0-5 Units Subcutaneous QHS Crosley, Debby, MD       insulin glargine-yfgn (SEMGLEE) injection 50 Units  50 Units Subcutaneous Daily Crosley, Debby, MD       [START ON 02/03/2023] isosorbide mononitrate (IMDUR) 24 hr tablet 30 mg  30 mg Oral Daily Gonfa, Taye T, MD       metoprolol tartrate (LOPRESSOR) tablet 37.5 mg  37.5 mg Oral BID Crosley, Debby, MD   37.5 mg at 02/02/23 9604   prochlorperazine (COMPAZINE) injection 10 mg  10 mg Intravenous Q6H PRN Crosley, Debby, MD       tamsulosin (FLOMAX) capsule 0.4 mg  0.4 mg Oral Daily Crosley, Debby, MD   0.4 mg at 02/02/23 0831     Discharge Medications: Please see discharge summary for a list of discharge medications.  Relevant Imaging Results:  Relevant Lab Results:   Additional Information SS#: [id number]  Helene Kelp, LCSW

## 2023-02-02 NOTE — Progress Notes (Signed)
Orthostatic hypotension: Orthostatic vitals positive.  Decreased Imdur and hydralazine.  Ordered compression socks.

## 2023-02-02 NOTE — ED Notes (Addendum)
Pt sleeping all night thus far no complaints

## 2023-02-02 NOTE — Discharge Instructions (Signed)

## 2023-02-02 NOTE — ED Notes (Signed)
Pt provided with breakfast tray.

## 2023-02-02 NOTE — TOC Initial Note (Addendum)
Transition of Care [facility]) - Initial/Assessment Note    Patient Details  Name: Jake Gross MRN: [id number] Date of Birth: 12-28-1940  Transition of [facility] CM/SW Contact:    Helene Kelp, LCSW Phone Number: 02/02/2023, 3:10 PM  Clinical Narrative:                 CSW met met with the patient at the bedside and reviewed clinical updates and current clinical disposition.  Patient responded with encouragement regarding getting better and his current  disposition. The patient noted he does not want to be referred/go back to the [facility] The patient expressed being okay with every other SNF option regarding referrals.   CSW checked with the patient to identify any concern, if any. The patient expressed no concerns regarding disposition.   CSW to complete TOC work up (per clinical team updates) and referrer the patient out for SNF Rehab posit d/c needs. No other needs identified by this Clinical research associate currently. Patient needs and current disposition to be followed by unit CSW.  Expected Discharge Plan: Skilled Nursing Facility Barriers to Discharge: No Barriers Identified, Continued Medical Work up   Patient Goals and CMS Choice   CMS Medicare.gov Compare Post Acute Care list provided to:: Patient Choice offered to / list presented to : Patient      Expected Discharge Plan and Services In-house Referral: Clinical Social Work   Post Acute Care Choice: Skilled Nursing Facility Living arrangements for the past 2 months: Single Family Home                  Prior Living Arrangements/Services Living arrangements for the past 2 months: Single Family Home Lives with:: Self Patient language and need for interpreter reviewed:: Yes Do you feel safe going back to the place where you live?: Yes      Need for Family Participation in Patient Care: Yes (Comment) Care giver support system in place?: Yes (comment)   Criminal Activity/Legal Involvement Pertinent to Current  Situation/Hospitalization: No - Comment as needed  Activities of Daily Living Home Assistive Devices/Equipment: Walker (specify type) (front-wheel walker) ADL Screening (condition at time of admission) Patient's cognitive ability adequate to safely complete daily activities?: Yes Is the patient deaf or have difficulty hearing?: No Does the patient have difficulty seeing, even when wearing glasses/contacts?: No Does the patient have difficulty concentrating, remembering, or making decisions?: Yes Patient able to express need for assistance with ADLs?: Yes Does the patient have difficulty dressing or bathing?: Yes Independently performs ADLs?: No Communication: Independent Dressing (OT): Needs assistance Is this a change from baseline?: Change from baseline, expected to last <3days Grooming: Needs assistance Is this a change from baseline?: Change from baseline, expected to last <3 days Feeding: Needs assistance Is this a change from baseline?: Change from baseline, expected to last <3 days Bathing: Needs assistance Is this a change from baseline?: Change from baseline, expected to last <3 days Toileting: Needs assistance Is this a change from baseline?: Change from baseline, expected to last <3 days In/Out Bed: Needs assistance Is this a change from baseline?: Change from baseline, expected to last <3 days Walks in Home: Needs assistance Is this a change from baseline?: Change from baseline, expected to last <3 days Does the patient have difficulty walking or climbing stairs?: Yes Weakness of Legs: Both Weakness of Arms/Hands: Both  Permission Sought/Granted Permission sought to share information with : Case Manager, Magazine features editor Permission granted to share information with : No  Emotional Assessment    Orientation: : Oriented to Self, Oriented to  Time, Oriented to Situation Alcohol / Substance Use: Not Applicable Psych Involvement: No  (comment)  Admission diagnosis:  Dehydration [E86.0] Acute-on-chronic kidney injury [N17.9, N18.9] Nausea and vomiting, unspecified vomiting type [R11.2] Patient Active Problem List   Diagnosis Date Noted   Acute-on-chronic kidney injury 02/01/2023   AKI (acute kidney injury) 01/25/2023   Acute renal failure 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 09/28/2022   Elevated troponin 09/28/2022   Lactic acidosis 09/28/2022   Prolonged QT interval 09/28/2022   Medicare annual wellness visit, subsequent 10/29/2021   Abnormal SPEP 09/12/2021   Dyspnea 06/27/2021   Abscess 06/27/2021   History of anemia 06/27/2021   Nocturia 06/27/2021   Benign hypertensive kidney disease with chronic kidney disease 12/22/2019   Proteinuria 12/22/2019   Stage 3b chronic kidney disease 12/22/2019   Type 2 diabetes mellitus with diabetic chronic kidney disease 12/22/2019   Hypokalemia 12/22/2019   History of GI bleed 09/29/2019   CAD (coronary artery disease) 09/29/2019   AVM (arteriovenous malformation) of small bowel, 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   Iron deficiency anemia due to chronic blood loss    Chronic anticoagulation    History of CVA (cerebrovascular accident) 08/25/2019   Chronic diastolic CHF (congestive heart failure) 12/17/2018   Paroxysmal atrial fibrillation 12/17/2018   CKD (chronic kidney disease) 12/17/2018   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 [place] [address] Phone: [phone] Fax: [phone]  Social Determinants of Health (SDOH) Social History: SDOH Screenings    Food Insecurity: No Food Insecurity (02/01/2023)  Housing: Low Risk  (02/01/2023)  Transportation Needs: No Transportation Needs (02/01/2023)  Utilities: Not At Risk (02/01/2023)  Alcohol Screen: Low Risk  (12/05/2022)  Depression (PHQ2-9): Low Risk  (12/05/2022)  Financial Resource Strain: Low Risk  (12/05/2022)  Physical Activity: Inactive (12/05/2022)  Social Connections: Socially Isolated (12/05/2022)  Stress: No Stress Concern Present (12/05/2022)  Tobacco Use: Medium Risk (02/01/2023)   SDOH Interventions:   Readmission Risk Interventions     No data to display

## 2023-02-02 NOTE — Consult Note (Signed)
   [facility] CM Inpatient Consult   02/02/2023  Edmond Ginsberg Tondreau Jul 16, 1941 [id number]  [facility] [[facility]]  Accountable Care Organization [ACO] Patient: Medicare ACO REACH  Primary Care Provider:  Eden Emms, NP with  at [facility] which is listed to provide the transition of care follow up   Patient is currently active with [facility] [[facility]] Care Management for chronic disease management services.  Patient has been engaged by a [facility] RNCC.  Our community based plan of care has focused on disease management and community resource support.   Patient was referred to [facility] for CCM program noted.    Met with patient at the bedside.  Patient is still appearing unwell.  He has eaten a small portion of his lunch on the tray and states, "so far had vomited."  Explained reason for the rounding visit and patient consents to ongoing follow up as needed with [facility] team.  Plan:  Followup for additional post hospital needs wil continue and make updates to the team as known.   Of note, [facility] Care Management services does not replace or interfere with any services that are needed or arranged by inpatient [facility] care management team.   For additional questions or referrals please contact:  Charlesetta Shanks, RN BSN CCM [facility] [place]  [phone] business mobile phone Toll free office [phone]  *Concierge Line  [phone] Fax number: [phone] [email or web address]

## 2023-02-03 DIAGNOSIS — N179 Acute kidney failure, unspecified: Secondary | ICD-10-CM | POA: Diagnosis not present

## 2023-02-03 DIAGNOSIS — R112 Nausea with vomiting, unspecified: Secondary | ICD-10-CM | POA: Diagnosis not present

## 2023-02-03 DIAGNOSIS — N184 Chronic kidney disease, stage 4 (severe): Secondary | ICD-10-CM | POA: Diagnosis not present

## 2023-02-03 DIAGNOSIS — I5032 Chronic diastolic (congestive) heart failure: Secondary | ICD-10-CM | POA: Diagnosis not present

## 2023-02-03 LAB — CBC
HCT: 36.2 % — ABNORMAL LOW (ref 39.0–52.0)
Hemoglobin: 11.6 g/dL — ABNORMAL LOW (ref 13.0–17.0)
MCH: 26.2 pg (ref 26.0–34.0)
MCHC: 32 g/dL (ref 30.0–36.0)
MCV: 81.7 fL (ref 80.0–100.0)
Platelets: 221 10*3/uL (ref 150–400)
RBC: 4.43 MIL/uL (ref 4.22–5.81)
RDW: 14.4 % (ref 11.5–15.5)
WBC: 10.7 10*3/uL — ABNORMAL HIGH (ref 4.0–10.5)
nRBC: 0 % (ref 0.0–0.2)

## 2023-02-03 LAB — GLUCOSE, CAPILLARY
Glucose-Capillary: 111 mg/dL — ABNORMAL HIGH (ref 70–99)
Glucose-Capillary: 127 mg/dL — ABNORMAL HIGH (ref 70–99)
Glucose-Capillary: 87 mg/dL (ref 70–99)
Glucose-Capillary: 113 mg/dL — ABNORMAL HIGH (ref 70–99)

## 2023-02-03 LAB — IRON AND TIBC
Iron: 32 ug/dL — ABNORMAL LOW (ref 45–182)
Saturation Ratios: 14 % — ABNORMAL LOW (ref 17.9–39.5)
TIBC: 235 ug/dL — ABNORMAL LOW (ref 250–450)
UIBC: 203 ug/dL

## 2023-02-03 LAB — COMPREHENSIVE METABOLIC PANEL
ALT: 11 U/L (ref 0–44)
AST: 17 U/L (ref 15–41)
Albumin: 2.6 g/dL — ABNORMAL LOW (ref 3.5–5.0)
Alkaline Phosphatase: 74 U/L (ref 38–126)
Anion gap: 10 (ref 5–15)
BUN: 82 mg/dL — ABNORMAL HIGH (ref 8–23)
CO2: 19 mmol/L — ABNORMAL LOW (ref 22–32)
Calcium: 8.2 mg/dL — ABNORMAL LOW (ref 8.9–10.3)
Chloride: 102 mmol/L (ref 98–111)
Creatinine, Ser: 3.11 mg/dL — ABNORMAL HIGH (ref 0.61–1.24)
GFR, Estimated: 19 mL/min — ABNORMAL LOW (ref >60)
Glucose, Bld: 118 mg/dL — ABNORMAL HIGH (ref 70–99)
Potassium: 4 mmol/L (ref 3.5–5.1)
Sodium: 131 mmol/L — ABNORMAL LOW (ref 135–145)
Total Bilirubin: 0.7 mg/dL (ref 0.3–1.2)
Total Protein: 6.1 g/dL — ABNORMAL LOW (ref 6.5–8.1)

## 2023-02-03 LAB — PHOSPHORUS: Phosphorus: 3.8 mg/dL (ref 2.5–4.6)

## 2023-02-03 LAB — RETICULOCYTES
Immature Retic Fract: 19.4 % — ABNORMAL HIGH (ref 2.3–15.9)
RBC.: 4.33 MIL/uL (ref 4.22–5.81)
Retic Count, Absolute: 83.6 10*3/uL (ref 19.0–186.0)
Retic Ct Pct: 1.9 % (ref 0.4–3.1)

## 2023-02-03 LAB — FERRITIN: Ferritin: 59 ng/mL (ref 24–336)

## 2023-02-03 LAB — CORTISOL: Cortisol, Plasma: 13.4 ug/dL

## 2023-02-03 LAB — MAGNESIUM: Magnesium: 2.4 mg/dL (ref 1.7–2.4)

## 2023-02-03 LAB — CK: Total CK: 70 U/L (ref 49–397)

## 2023-02-03 LAB — VITAMIN B12: Vitamin B-12: 211 pg/mL (ref 180–914)

## 2023-02-03 LAB — FOLATE: Folate: 9.3 ng/mL (ref >5.9)

## 2023-02-03 LAB — SODIUM, URINE, RANDOM: Sodium, Ur: 58 mmol/L

## 2023-02-03 MED ORDER — ALBUMIN HUMAN 25 % IV SOLN
25.0000 g | Freq: Four times a day (QID) | INTRAVENOUS | Status: AC
  Administered 2023-02-03 – 2023-02-04 (×5): 25 g via INTRAVENOUS
  Filled 2023-02-03 (×5): qty 100

## 2023-02-03 NOTE — Plan of Care (Signed)

## 2023-02-03 NOTE — Evaluation (Addendum)
Occupational Therapy Evaluation Patient Details Name: Jake Gross MRN: [id number] DOB: [birth year] Today's Date: 02/03/2023   History of Present Illness Pt is an 82 yo male presenting to [facility] ED on 4/10 via EMS due to fall. He has had nausea/vomiting for 4 months and has had progressive weakness over the past 3 days. 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  PMH: DM, HTN, colon cancer, CVA, HLD, CHF, renal disease.   Clinical Impression   Pt presents with decline in function and safety with ADLs and ADL mobility with impaired strength, balance and endurance. Pt with multiple falls at home and recently d/c from this hospital. Pt spent time at [facility] for rehab recently as well. PTA pt lived at home alone and was Ind with ADLs/selfcare, IADLs, home mgt, cooking and used RW for mobility. Pt currently requires min A with LB ADLs, min guard A with toileting tasks and min - min guard A with mobility using RW. No c/o of dizziness during session. Pt demos slight impaired safety awareness when using RW during ADL mobility. Pt would benefit from acute OT services to address impairments to maximize level of function and safety BP readings: Supine 139/91 Standing at EOB 131/72 After walking to bathroom to sitting (in recliner) 134/70        Recommendations for follow up therapy are one component of a multi-disciplinary discharge planning process, led by the attending physician.  Recommendations may be updated based on patient status, additional functional criteria and insurance authorization.   Assistance Recommended at Discharge Frequent or constant Supervision/Assistance  Patient can return home with the following A little help with bathing/dressing/bathroom;A little help with walking and/or transfers;Help with stairs or ramp for entrance;Assist for transportation;Assistance with cooking/housework    Functional Status Assessment  Patient has had a recent decline in their functional status and  demonstrates the ability to make significant improvements in function in a reasonable and predictable amount of time.  Equipment Recommendations  Tub/shower bench    Recommendations for Other Services       Precautions / Restrictions Precautions Precautions: Fall Precaution Comments: pt reports multiple falls at home Restrictions Weight Bearing Restrictions: No      Mobility Bed Mobility Overal bed mobility: Needs Assistance Bed Mobility: Supine to Sit     Supine to sit: Min guard, HOB elevated          Transfers Overall transfer level: Needs assistance Equipment used: 1 person hand held assist, Rolling walker (2 wheels) Transfers: Sit to/from Stand, Bed to chair/wheelchair/BSC Sit to Stand: Min assist     Step pivot transfers: Min guard            Balance Overall balance assessment: Needs assistance Sitting-balance support: Feet supported, Bilateral upper extremity supported Sitting balance-Leahy Scale: Fair Sitting balance - Comments: posterior lean with UB dressing Postural control: Posterior lean Standing balance support: Bilateral upper extremity supported, During functional activity Standing balance-Leahy Scale: Poor                             ADL either performed or assessed with clinical judgement   ADL Overall ADL's : Needs assistance/impaired Eating/Feeding: Independent;Sitting   Grooming: Wash/dry hands;Wash/dry face;Min guard;Standing   Upper Body Bathing: Set up;Supervision/ safety;Sitting   Lower Body Bathing: Minimal assistance   Upper Body Dressing : Set up;Supervision/safety;Sitting   Lower Body Dressing: Minimal assistance   Toilet Transfer: Min guard;Rolling walker (2 wheels);Ambulation;BSC/3in1  Toileting- Architect and Hygiene: Min guard;Sit to/from stand       Functional mobility during ADLs: Min guard;Rolling walker (2 wheels)       Vision Ability to See in Adequate Light: 0 Adequate Patient  Visual Report: No change from baseline       Perception     Praxis      Pertinent Vitals/Pain Pain Assessment Pain Assessment: 0-10 Pain Score: 2  Pain Location: R UE abrasions Pain Descriptors / Indicators: Sore Pain Intervention(s): Monitored during session     Hand Dominance Right   Extremity/Trunk Assessment Upper Extremity Assessment Upper Extremity Assessment: Generalized weakness   Lower Extremity Assessment Lower Extremity Assessment: Defer to PT evaluation   Cervical / Trunk Assessment Cervical / Trunk Assessment: Normal   Communication Communication Communication: No difficulties   Cognition Arousal/Alertness: Awake/alert Behavior During Therapy: WFL for tasks assessed/performed Overall Cognitive Status: Within Functional Limits for tasks assessed                                 General Comments: slight decrease in safety awareness whenusing RW and memory, but pt reports his memory has been like this for a while. Able to perform simple money management     General Comments       Exercises     Shoulder Instructions      Home Living Family/patient expects to be discharged to:: Private residence Living Arrangements: Alone Available Help at Discharge: Family;Available PRN/intermittently   Home Access: Stairs to enter Entrance Stairs-Number of Steps: 5 Entrance Stairs-Rails: Right;Left       Bathroom Shower/Tub: Chief Strategy Officer: Handicapped height Bathroom Accessibility: Yes   Home Equipment: Cane - single point;Cane - Programmer, applications (2 wheels)          Prior Functioning/Environment Prior Level of Function : Independent/Modified Independent;Driving;History of Falls (last six months)             Mobility Comments: Pt was ambulating with RW. He states that he uses it when he goes out in the community and in his home. 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 He had 4 falls in one  day which is why he is here but otherwise denies falls. ADLs Comments: Ind with ADLs/selfcare, IADLs, home mgt, cooking, grocery shopping, drives        OT Problem List: Decreased activity tolerance;Impaired balance (sitting and/or standing);Decreased safety awareness;Decreased strength      OT Treatment/Interventions: Self-care/ADL training;Therapeutic exercise;DME and/or AE instruction;Balance training;Patient/family education;Therapeutic activities    OT Goals(Current goals can be found in the care plan section) Acute Rehab OT Goals Patient Stated Goal: go home OT Goal Formulation: With patient Time For Goal Achievement: 02/17/23 Potential to Achieve Goals: Good ADL Goals Pt Will Perform Grooming: with supervision;with set-up;standing Pt Will Perform Lower Body Bathing: with min guard assist;with supervision Pt Will Perform Lower Body Dressing: with min guard assist;with supervision Pt Will Transfer to Toilet: with supervision;with modified independence;ambulating;grab bars Pt Will Perform Toileting - Clothing Manipulation and hygiene: with supervision;with modified independence;sit to/from stand Pt Will Perform Tub/Shower Transfer: with min guard assist;with supervision;ambulating;tub bench;grab bars  OT Frequency: Min 2X/week    Co-evaluation              AM-PAC OT "6 Clicks" Daily Activity     Outcome Measure Help from another person eating meals?: None Help from another person taking care of  personal grooming?: A Little Help from another person toileting, which includes using toliet, bedpan, or urinal?: A Little Help from another person bathing (including washing, rinsing, drying)?: A Little Help from another person to put on and taking off regular upper body clothing?: A Little Help from another person to put on and taking off regular lower body clothing?: A Little 6 Click Score: 19   End of Session Equipment Utilized During Treatment: Gait belt;Rolling walker (2  wheels)  Activity Tolerance: Patient tolerated treatment well Patient left: in chair;with call bell/phone within reach  OT Visit Diagnosis: Unsteadiness on feet (R26.81);Muscle weakness (generalized) (M62.81);Other abnormalities of gait and mobility (R26.89)                Time: 4132-4401 OT Time Calculation (min): 24 min Charges:  OT General Charges $OT Visit: 1 Visit OT Evaluation $OT Eval Moderate Complexity: 1 Mod OT Treatments $Therapeutic Activity: 8-22 mins    Galen Manila 02/03/2023, 10:45 AM

## 2023-02-03 NOTE — TOC Progression Note (Signed)
Transition of Care [facility]) - Progression Note    Patient Details  Name: Jake Gross MRN: [id number] Date of Birth: 15-Sep-1941  Transition of [facility]) CM/SW Contact  [address], [place], [place] Phone Number: 02/03/2023, 2:03 PM  Clinical Narrative:     This Child psychotherapist met with patient at bedside. Current bed offers provided. Patient would like to consider all choices before making a final decion. Social Worker to follow up with patient tomorrow.  Kristena Wilhelmi, LCSW Transition of Care    Expected Discharge Plan: Skilled Nursing Facility Barriers to Discharge: No Barriers Identified, Continued Medical Work up  Expected Discharge Plan and Services In-house Referral: Clinical Social Work   Post Acute Care Choice: Skilled Nursing Facility Living arrangements for the past 2 months: Single Family Home                                       Social Determinants of Health (SDOH) Interventions SDOH Screenings   Food Insecurity: No Food Insecurity (02/01/2023)  Housing: Low Risk  (02/01/2023)  Transportation Needs: No Transportation Needs (02/01/2023)  Utilities: Not At Risk (02/01/2023)  Alcohol Screen: Low Risk  (12/05/2022)  Depression (PHQ2-9): Low Risk  (12/05/2022)  Financial Resource Strain: Low Risk  (12/05/2022)  Physical Activity: Inactive (12/05/2022)  Social Connections: Socially Isolated (12/05/2022)  Stress: No Stress Concern Present (12/05/2022)  Tobacco Use: Medium Risk (02/01/2023)    Readmission Risk Interventions     No data to display

## 2023-02-03 NOTE — Progress Notes (Signed)
PROGRESS NOTE  Jake Gross ZOX:[id number] DOB: 11/10/1940   PCP: Eden Emms, NP  Patient is from: Home.  Lives alone.  Uses rolling walker at baseline.  DOA: 02/01/2023 LOS: 2  Chief complaints Chief Complaint  Patient presents with   Weakness   Emesis     Brief Narrative / Interim history: 82 year old M with PMH of diastolic CHF, CKD-4, DM-2, HTN and recent hospitalization from 4/10-4/20 for nausea, vomiting and fall for which she was treated with IV hydration and antiemetics and returning with nausea and vomiting for about 7 days, generalized weakness and some shortness of breath.  Admitted for AKI on CKD-4 and generalized weakness and fall at home. Cr 3.4 (baseline 2.7).  CT also concerning for liver cirrhosis.   Subjective:  No nausea, no vomiting, no diarrhea,  generalized weakness, fatigue, he was orthostatic yesterday   Objective: Vitals:   02/02/23 2305 02/03/23 0317 02/03/23 0812 02/03/23 1125  BP: (!) 137/97 (!) 124/49    Pulse: 60 (!) 49    Resp: 17 16    Temp: 97.7 F (36.5 C) 98.1 F (36.7 C) 98.3 F (36.8 C) 98.5 F (36.9 C)  TempSrc: Oral Axillary Oral Oral  SpO2: 92% 96%    Weight:      Height:        Examination:  Awake Alert, Oriented X 3, frail, deconditioned Symmetrical Chest wall movement, Good air movement bilaterally, CTAB RRR,No Gallops,Rubs or new Murmurs, No Parasternal Heave +ve B.Sounds, Abd Soft, No tenderness, No rebound - guarding or rigidity. No Cyanosis, Clubbing or edema, skin lacerations, please see pictures below       Procedures:  None  Microbiology summarized: COVID-19, influenza and RSV PCR nonreactive  Assessment and plan: Principal Problem:   Acute-on-chronic kidney injury Active Problems:   Acute renal failure 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   Chronic diastolic CHF (congestive heart failure)   Paroxysmal atrial fibrillation   Diabetes mellitus type 2, insulin dependent   CAD  (coronary artery disease)   Essential hypertension   HLD (hyperlipidemia)   Benign hypertensive kidney disease with chronic kidney disease   AKI on CKD-4/azotemia: - Likely prerenal from GI loss and concurrent use of diuretics.  As well can be related to orthostasis/low blood pressure from failure to thrive and stable antihypertensive medications, baseline appears to be 2.7.  No signs of obstruction on CT.  Cr seems to have plateaued. -Strict intake and output -Hold diuretics -Discontinue Farxiga and Hygroton on discharge. -Started on IV albumin his orthostasis -Continue with IV fluids, monitor closely given history of CHF  Orthostasis -Due to multiple antihypertensive medications, dehydration volume depletion. -Antihypertensive medications has been adjusted hydralazine and Imdur has been lowered, Toprol-XL has been discontinued in the setting of bradycardia . -Continue with albumin -Recent random cortisol was 13.   Bradycardia with multiple pauses -Patient was noted on telemetry to have bradycardia heart rate in the 50s, dips to 30s while sleeping, Toprol-XL has been discontinued  Intractable nausea and vomiting:  -This appears to be resolved, and likely gastroparesis given her Acuson not lasting, not accompanied by abdominal pain -to be currently resolved -CT abdomen pelvis on admission with possible evidence of mild diverticulitis, but no clinical concerns on physical exam or history, will monitor off antibiotics  Recurrent fall/generalized weakness: Patient lives alone.  Uses rolling walker at baseline.  Reportedly had a fall on 4/15.  Has bruises on his arms and left leg. -Fall precaution -PT/OT  Paroxysmal A-fib:  Currently in sinus rhythm.  On metoprolol and low-dose Eliquis at home.  Has frequent falls. -Discontinue metoprolol given bradycardia and multiple pauses and orthostasis -Continue Eliquis while in-house -Needs risk and benefit discussion of discharge about  anticoagulation -Optimize electrolytes  Essential hypertension:  -please see above discussion under orthostasis -Imdur and hydralazine has been lowered, remains on same dose Norvasc, his Toprol-XL has been discontinued.  Controlled IDDM-2 with hyperglycemia: A1c 6.4% on 11/09/2022 Recent Labs  Lab 02/02/23 1102 02/02/23 1542 02/02/23 2113 02/03/23 0808 02/03/23 1124  GLUCAP 129* 146* 223* 111* 127*  -Continue current insulin regimen -Recheck hemoglobin A1c -Discontinue Farxiga on discharge  Cholelithiasis/mild hyperbilirubinemia/possible liver cirrhosis: Could be contributing to nausea and vomiting but symptoms have resolved now. -Continue monitoring -Outpatient follow-up  Prolonged QTc: QTc 530, and improved to 467 today. -Optimize electrolytes   History of CAD/elevated troponin: Elevated troponin likely demand ischemia and delayed clearance. -Continue home medications  Chronic diastolic CHF: Was dehydrated on exam.  Appears euvolemic this morning.  No cardiopulmonary symptoms. -Hold diuretics and adding of AKI -Strict intake and output, daily weights, renal functions and electrolytes   Hyperlipidemia -Continue Crestor.  Hypokalemia: Likely due to GI loss and Hygroton. -Monitor replenish as appropriate    Body mass index is 28.19 kg/m.  Pressure skin injury: Has a skin bruises on his arms and left leg from fall.  No signs of infection.  POA -Consulted wound care         DVT prophylaxis:  Place TED hose Start: 02/02/23 1242 apixaban (ELIQUIS) tablet 2.5 mg Start: 02/01/23 2315 apixaban (ELIQUIS) tablet 2.5 mg  Code Status: Full code Family Communication: None at bedside Level of care: Med-Surg Status is: Inpatient Remains inpatient appropriate because: AKI, recurrent fall, hypokalemia and generalized weakness   Final disposition: TBD Consultants:  None  55 minutes with more than 50% spent in reviewing records, counseling patient/family and coordinating  care.   Sch Meds:  Scheduled Meds:  amLODipine  5 mg Oral Daily   apixaban  2.5 mg Oral BID   hydrALAZINE  50 mg Oral TID   insulin aspart  0-15 Units Subcutaneous TID WC   insulin aspart  0-5 Units Subcutaneous QHS   insulin glargine-yfgn  50 Units Subcutaneous Daily   isosorbide mononitrate  30 mg Oral Daily   metoprolol tartrate  37.5 mg Oral BID   tamsulosin  0.4 mg Oral Daily   Continuous Infusions:  sodium chloride 75 mL/hr at 02/02/23 1851   PRN Meds:.acetaminophen **OR** acetaminophen, prochlorperazine  Antimicrobials: Anti-infectives (From admission, onward)    Start     Dose/Rate Route Frequency Ordered Stop   02/02/23 0600  piperacillin-tazobactam (ZOSYN) IVPB 3.375 g        3.375 g 100 mL/hr over 30 Minutes Intravenous  Once 02/02/23 0557 02/02/23 0622        I have personally reviewed the following labs and images: CBC: Recent Labs  Lab 02/01/23 1720 02/01/23 2057 02/02/23 0132 02/03/23 0330  WBC 11.7*  --  10.4 10.7*  NEUTROABS  --   --  7.5  --   HGB 14.9 14.3 13.0 11.6*  HCT 44.8 42.0 39.5 36.2*  MCV 80.7  --  80.0 81.7  PLT 332  --  257 221   BMP &GFR Recent Labs  Lab 02/01/23 1720 02/01/23 2057 02/02/23 0132 02/03/23 0330  NA 138 139 136 131*  K 3.9 3.7 3.1* 4.0  CL 98  --  102 102  CO2 14*  --  19* 19*  GLUCOSE 119*  --  80 118*  BUN 81*  --  80* 82*  CREATININE 3.50*  --  3.40* 3.11*  CALCIUM 9.4  --  8.6* 8.2*  MG  --   --  2.4 2.4  PHOS  --   --   --  3.8   Estimated Creatinine Clearance: 19.4 mL/min (A) (by C-G formula based on SCr of 3.11 mg/dL (H)). Liver & Pancreas: Recent Labs  Lab 02/01/23 1720 02/02/23 0132 02/03/23 0330  AST ALT ALKPHOS 93 83 74  BILITOT 1.5* 1.4* 0.7  PROT 7.6 6.7 6.1*  ALBUMIN 3.3* 2.9* 2.6*   Recent Labs  Lab 02/01/23 1720  LIPASE 40   No results for input(s): "AMMONIA" in the last 168 hours. Diabetic: No results for input(s): "HGBA1C" in the last 72  hours. Recent Labs  Lab 02/02/23 1102 02/02/23 1542 02/02/23 2113 02/03/23 0808 02/03/23 1124  GLUCAP 129* 146* 223* 111* 127*   Cardiac Enzymes: Recent Labs  Lab 02/02/23 0132 02/03/23 0330  CKTOTAL 66 70   No results for input(s): "PROBNP" in the last 8760 hours. Coagulation Profile: No results for input(s): "INR", "PROTIME" in the last 168 hours. Thyroid Function Tests: Recent Labs    02/02/23 0132  TSH 0.731   Lipid Profile: No results for input(s): "CHOL", "HDL", "LDLCALC", "TRIG", "CHOLHDL", "LDLDIRECT" in the last 72 hours. Anemia Panel: Recent Labs    02/03/23 0330  VITAMINB12 211  FOLATE 9.3  FERRITIN 59  TIBC 235*  IRON 32*  RETICCTPCT 1.9   Urine analysis:    Component Value Date/Time   COLORURINE YELLOW 02/01/2023 2252   APPEARANCEUR HAZY (A) 02/01/2023 2252   LABSPEC 1.010 02/01/2023 2252   PHURINE 5.0 02/01/2023 2252   GLUCOSEU 50 (A) 02/01/2023 2252   HGBUR NEGATIVE 02/01/2023 2252   BILIRUBINUR NEGATIVE 02/01/2023 2252   KETONESUR NEGATIVE 02/01/2023 2252   PROTEINUR 100 (A) 02/01/2023 2252   UROBILINOGEN 0.2 04/11/2015 1658   NITRITE NEGATIVE 02/01/2023 2252   LEUKOCYTESUR NEGATIVE 02/01/2023 2252   Sepsis Labs: Invalid input(s): "PROCALCITONIN", "LACTICIDVEN"  Microbiology: Recent Results (from the past 240 hour(s))  Resp panel by RT-PCR (RSV, Flu A&B, Covid) Anterior Nasal Swab     Status: None   Collection Time: 02/01/23  7:22 PM   Specimen: Anterior Nasal Swab  Result Value Ref Range Status   SARS Coronavirus 2 by RT PCR NEGATIVE NEGATIVE Final   Influenza A by PCR NEGATIVE NEGATIVE Final   Influenza B by PCR NEGATIVE NEGATIVE Final    Comment: (NOTE) The Xpert Xpress SARS-CoV-2/FLU/RSV plus assay is intended as an aid in the diagnosis of influenza from Nasopharyngeal swab specimens and should not be used as a sole basis for treatment. Nasal washings and aspirates are unacceptable for Xpert Xpress  SARS-CoV-2/FLU/RSV testing.  Fact Sheet for Patients: BloggerCourse.com  Fact Sheet for Healthcare Providers: SeriousBroker.it  This test is not yet approved or cleared by the [place] FDA and has been authorized for detection and/or diagnosis of SARS-CoV-2 by FDA under an Emergency Use Authorization (EUA). This EUA will remain in effect (meaning this test can be used) for the duration of the COVID-19 declaration under Section 564(b)(1) of the Act, 21 U.S.C. section 360bbb-3(b)(1), unless the authorization is terminated or revoked.     Resp Syncytial Virus by PCR NEGATIVE NEGATIVE Final    Comment: (NOTE) Fact Sheet for Patients: BloggerCourse.com  Fact Sheet for Healthcare Providers: SeriousBroker.it  This test is not yet approved or cleared by the [place] and has been authorized for detection and/or diagnosis of SARS-CoV-2 by FDA under an Emergency Use Authorization (EUA). This EUA will remain in effect (meaning this test can be used) for the duration of the COVID-19 declaration under Section 564(b)(1) of the Act, 21 U.S.C. section 360bbb-3(b)(1), unless the authorization is terminated or revoked.  Performed at [facility], [address], [place], [place] [address]     Radiology Studies: No results found.    Huey Bienenstock MD [facility]  If 7PM-7AM, please contact night-coverage [email or web address] 02/03/2023, 2:34 PM

## 2023-02-04 ENCOUNTER — Inpatient Hospital Stay (HOSPITAL_COMMUNITY): Payer: Medicare Other

## 2023-02-04 DIAGNOSIS — I5032 Chronic diastolic (congestive) heart failure: Secondary | ICD-10-CM | POA: Diagnosis not present

## 2023-02-04 DIAGNOSIS — I480 Paroxysmal atrial fibrillation: Secondary | ICD-10-CM | POA: Diagnosis not present

## 2023-02-04 DIAGNOSIS — R008 Other abnormalities of heart beat: Secondary | ICD-10-CM

## 2023-02-04 DIAGNOSIS — N179 Acute kidney failure, unspecified: Secondary | ICD-10-CM | POA: Diagnosis not present

## 2023-02-04 DIAGNOSIS — N184 Chronic kidney disease, stage 4 (severe): Secondary | ICD-10-CM | POA: Diagnosis not present

## 2023-02-04 LAB — BASIC METABOLIC PANEL
Anion gap: 9 (ref 5–15)
BUN: 71 mg/dL — ABNORMAL HIGH (ref 8–23)
CO2: 20 mmol/L — ABNORMAL LOW (ref 22–32)
Calcium: 8.3 mg/dL — ABNORMAL LOW (ref 8.9–10.3)
Chloride: 107 mmol/L (ref 98–111)
Creatinine, Ser: 2.35 mg/dL — ABNORMAL HIGH (ref 0.61–1.24)
GFR, Estimated: 27 mL/min — ABNORMAL LOW (ref >60)
Glucose, Bld: 110 mg/dL — ABNORMAL HIGH (ref 70–99)
Potassium: 3.6 mmol/L (ref 3.5–5.1)
Sodium: 136 mmol/L (ref 135–145)

## 2023-02-04 LAB — GLUCOSE, CAPILLARY
Glucose-Capillary: 142 mg/dL — ABNORMAL HIGH (ref 70–99)
Glucose-Capillary: 81 mg/dL (ref 70–99)
Glucose-Capillary: 109 mg/dL — ABNORMAL HIGH (ref 70–99)
Glucose-Capillary: 106 mg/dL — ABNORMAL HIGH (ref 70–99)

## 2023-02-04 LAB — ECHOCARDIOGRAM COMPLETE
Area-P 1/2: 3.2 cm2
Height: 67 in
S' Lateral: 3 cm
Weight: 2880 oz

## 2023-02-04 LAB — CBC
HCT: 34.9 % — ABNORMAL LOW (ref 39.0–52.0)
Hemoglobin: 11.2 g/dL — ABNORMAL LOW (ref 13.0–17.0)
MCH: 26.5 pg (ref 26.0–34.0)
MCHC: 32.1 g/dL (ref 30.0–36.0)
MCV: 82.7 fL (ref 80.0–100.0)
Platelets: 193 10*3/uL (ref 150–400)
RBC: 4.22 MIL/uL (ref 4.22–5.81)
RDW: 14.5 % (ref 11.5–15.5)
WBC: 9.2 10*3/uL (ref 4.0–10.5)
nRBC: 0 % (ref 0.0–0.2)

## 2023-02-04 LAB — PHOSPHORUS: Phosphorus: 4.3 mg/dL (ref 2.5–4.6)

## 2023-02-04 LAB — MAGNESIUM: Magnesium: 2.5 mg/dL — ABNORMAL HIGH (ref 1.7–2.4)

## 2023-02-04 MED ORDER — ISOSORBIDE MONONITRATE ER 60 MG PO TB24
60.0000 mg | ORAL_TABLET | Freq: Every day | ORAL | Status: DC
  Administered 2023-02-05: 60 mg via ORAL
  Filled 2023-02-04: qty 1

## 2023-02-04 MED ORDER — POTASSIUM CHLORIDE CRYS ER 20 MEQ PO TBCR
40.0000 meq | EXTENDED_RELEASE_TABLET | Freq: Once | ORAL | Status: AC
  Administered 2023-02-04: 40 meq via ORAL
  Filled 2023-02-04: qty 2

## 2023-02-04 MED ORDER — HYDRALAZINE HCL 20 MG/ML IJ SOLN
5.0000 mg | INTRAMUSCULAR | Status: DC | PRN
  Administered 2023-02-04 – 2023-02-05 (×2): 5 mg via INTRAVENOUS
  Filled 2023-02-04 (×3): qty 1

## 2023-02-04 MED ORDER — HYDRALAZINE HCL 20 MG/ML IJ SOLN: 5.0000 mg | Freq: Four times a day (QID) | INTRAMUSCULAR | Status: DC | PRN

## 2023-02-04 MED ORDER — ISOSORBIDE MONONITRATE ER 30 MG PO TB24
30.0000 mg | ORAL_TABLET | Freq: Once | ORAL | Status: AC
  Administered 2023-02-04: 30 mg via ORAL
  Filled 2023-02-04: qty 1

## 2023-02-04 NOTE — Progress Notes (Signed)
  Echocardiogram 2D Echocardiogram has been performed.  Jake Gross 02/04/2023, 3:42 PM

## 2023-02-04 NOTE — TOC Progression Note (Signed)
Transition of Care [facility]) - Progression Note    Patient Details  Name: Jake Gross Neff MRN: [id number] Date of Birth: 07/31/1941  Transition of Care [facility]) CM/SW Contact  Rahel Carlton [place], [place] Phone Number: 02/04/2023, 1:24 PM  Clinical Narrative:     Met with patient at bedside, provided SNF star rating list from the medicare.gov website to assist with bed choice. Patient chose [facility]. Facility informed of bed choice.  [place], LCSW Transition of Care    Expected Discharge Plan: Skilled Nursing Facility Barriers to Discharge: No Barriers Identified, Continued Medical Work up  Expected Discharge Plan and Services In-house Referral: Clinical Social Work   Post Acute Care Choice: Skilled Nursing Facility Living arrangements for the past 2 months: Single Family Home                                       Social Determinants of Health (SDOH) Interventions SDOH Screenings   Food Insecurity: No Food Insecurity (02/01/2023)  Housing: Low Risk  (02/01/2023)  Transportation Needs: No Transportation Needs (02/01/2023)  Utilities: Not At Risk (02/01/2023)  Alcohol Screen: Low Risk  (12/05/2022)  Depression (PHQ2-9): Low Risk  (12/05/2022)  Financial Resource Strain: Low Risk  (12/05/2022)  Physical Activity: Inactive (12/05/2022)  Social Connections: Socially Isolated (12/05/2022)  Stress: No Stress Concern Present (12/05/2022)  Tobacco Use: Medium Risk (02/01/2023)    Readmission Risk Interventions     No data to display

## 2023-02-04 NOTE — Progress Notes (Signed)
PROGRESS NOTE  Jake Gross ZOX:[id number] DOB: [birth year]   PCP: Eden Emms, NP  Patient is from: Home.  Lives alone.  Uses rolling walker at baseline.  DOA: 02/01/2023 LOS: 3  Chief complaints Chief Complaint  Patient presents with   Weakness   Emesis     Brief Narrative / Interim history: 82 year old M with PMH of diastolic CHF, CKD-4, DM-2, HTN and recent hospitalization from 4/10-4/20 for nausea, vomiting and fall for which she was treated with IV hydration and antiemetics and returning with nausea and vomiting for about 7 days, generalized weakness and some shortness of breath.  Admitted for AKI on CKD-4 and generalized weakness and fall at home. Cr 3.4 (baseline 2.7).  CT also concerning for liver cirrhosis.   Subjective:  No nausea, no vomiting, no diarrhea, good oral intake, he is not orthostatic today .   Objective: Vitals:   02/03/23 2313 02/04/23 0400 02/04/23 0800 02/04/23 1148  BP: 138/73 (!) 159/70  (!) 172/80  Pulse: (!) 46 (!) 57  63  Resp: 12 17  16   Temp: 98.2 F (36.8 C) 98.6 F (37 C) 98 F (36.7 C)   TempSrc: Oral Oral Oral   SpO2: 96% 95%  97%  Weight:      Height:        Examination:  Awake Alert, Oriented X 3, frail, deconditioned Symmetrical Chest wall movement, Good air movement bilaterally, CTAB RRR,No Gallops,Rubs or new Murmurs, No Parasternal Heave +ve B.Sounds, Abd Soft, No tenderness, No rebound - guarding or rigidity. No Cyanosis, Clubbing or edema, No new Rash or bruise     Procedures:  None  Microbiology summarized: COVID-19, influenza and RSV PCR nonreactive  Assessment and plan: Principal Problem:   Acute-on-chronic kidney injury Active Problems:   Acute renal failure 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   Chronic diastolic CHF (congestive heart failure)   Paroxysmal atrial fibrillation   Diabetes mellitus type 2, insulin dependent   CAD (coronary artery disease)   Essential hypertension   HLD  (hyperlipidemia)   Benign hypertensive kidney disease with chronic kidney disease   AKI on CKD-4/azotemia: - Likely prerenal from GI loss and concurrent use of diuretics.  As well can be related to orthostasis/low blood pressure from failure to thrive and stable antihypertensive medications, baseline appears to be 2.7.  No signs of obstruction on CT.  Cr seems to have plateaued. -Strict intake and output -Hold diuretics -Discontinue Farxiga and Hygroton on discharge. -Started on IV albumin his orthostasis -Function much improved, back to baseline, will DC see his IV fluids given send known history of CHF  Orthostasis -Due to multiple antihypertensive medications, dehydration volume depletion. -Antihypertensive medications has been adjusted hydralazine and Imdur has been lowered, Toprol-XL has been discontinued in the setting of bradycardia . -Continue with albumin -Recent random cortisol was 13.   Bradycardia with multiple pauses -Patient was noted on telemetry to have bradycardia heart rate in the 50s, dips to 30s while sleeping  Intractable nausea and vomiting:  -This appears to be resolved, and likely gastroparesis given her Acuson not lasting, not accompanied by abdominal pain -to be currently resolved -CT abdomen pelvis on admission with possible evidence of mild diverticulitis, but no clinical concerns on physical exam or history, will monitor off antibiotics  Recurrent fall/generalized weakness: Patient lives alone.  Uses rolling walker at baseline.  Reportedly had a fall on 4/15.  Has bruises on his arms and left leg. -Fall precaution -PT/OT  Paroxysmal A-fib:  Currently in sinus rhythm.  On metoprolol and low-dose Eliquis at home.  Has frequent falls. -Discontinue metoprolol given bradycardia and multiple pauses and orthostasis -Continue Eliquis while in-house -Needs risk and benefit discussion of discharge about anticoagulation -Optimize electrolytes  Essential  hypertension:  -please see above discussion under orthostasis -Blood pressure started to increase, will DC IV albumin, will increase hydralazine and Imdur today, will continue hold Toprol-XL due to bradycardia.  Controlled IDDM-2 with hyperglycemia: A1c 6.4% on 11/09/2022 Recent Labs  Lab 02/03/23 1124 02/03/23 1516 02/03/23 2117 02/04/23 0805 02/04/23 1146  GLUCAP 127* 87 113* 142* 81  -Continue current insulin regimen -Recheck hemoglobin A1c -Discontinue Farxiga on discharge  Cholelithiasis/mild hyperbilirubinemia/possible liver cirrhosis: Could be contributing to nausea and vomiting but symptoms have resolved now. -Continue monitoring -Outpatient follow-up  Prolonged QTc: QTc 530, and improved to 467 today. -Optimize electrolytes   History of CAD/elevated troponin: Elevated troponin likely demand ischemia and delayed clearance. -Continue home medications  Chronic diastolic CHF: Was dehydrated on exam.  Appears euvolemic this morning.  No cardiopulmonary symptoms. -Hold diuretics and adding of AKI -Strict intake and output, daily weights, renal functions and electrolytes   Hyperlipidemia -Continue Crestor.  Hypokalemia: Likely due to GI loss and Hygroton. -Monitor replenish as appropriate    Body mass index is 28.19 kg/m.  Pressure skin injury: Has a skin bruises on his arms and left leg from fall.  No signs of infection.  POA -Consulted wound care         DVT prophylaxis:  Place TED hose Start: 02/02/23 1242 apixaban (ELIQUIS) tablet 2.5 mg Start: 02/01/23 2315 apixaban (ELIQUIS) tablet 2.5 mg  Code Status: Full code Family Communication: None at bedside Level of care: Med-Surg Status is: Inpatient Remains inpatient appropriate because: AKI, recurrent fall, hypokalemia and generalized weakness   Final disposition: TBD Consultants:  None  55 minutes with more than 50% spent in reviewing records, counseling patient/family and coordinating care.   Sch  Meds:  Scheduled Meds:  amLODipine  5 mg Oral Daily   apixaban  2.5 mg Oral BID   hydrALAZINE  50 mg Oral TID   insulin aspart  0-15 Units Subcutaneous TID WC   insulin aspart  0-5 Units Subcutaneous QHS   insulin glargine-yfgn  50 Units Subcutaneous Daily   isosorbide mononitrate  30 mg Oral Once   [START ON 02/05/2023] isosorbide mononitrate  60 mg Oral Daily   potassium chloride  40 mEq Oral Once   tamsulosin  0.4 mg Oral Daily   Continuous Infusions:  albumin human 25 g (02/04/23 0906)   PRN Meds:.acetaminophen **OR** acetaminophen, hydrALAZINE, prochlorperazine  Antimicrobials: Anti-infectives (From admission, onward)    Start     Dose/Rate Route Frequency Ordered Stop   02/02/23 0600  piperacillin-tazobactam (ZOSYN) IVPB 3.375 g        3.375 g 100 mL/hr over 30 Minutes Intravenous  Once 02/02/23 0557 02/02/23 0622        I have personally reviewed the following labs and images: CBC: Recent Labs  Lab 02/01/23 1720 02/01/23 2057 02/02/23 0132 02/03/23 0330 02/04/23 0341  WBC 11.7*  --  10.4 10.7* 9.2  NEUTROABS  --   --  7.5  --   --   HGB 14.9 14.3 13.0 11.6* 11.2*  HCT 44.8 42.0 39.5 36.2* 34.9*  MCV 80.7  --  80.0 81.7 82.7  PLT 332  --  257 221 193   BMP &GFR Recent Labs  Lab 02/01/23 1720 02/01/23 2057 02/02/23 0132  02/03/23 0330 02/04/23 0341  NA 138 139 136 131* 136  K 3.9 3.7 3.1* 4.0 3.6  CL 98  --  102 102 107  CO2 14*  --  19* 19* 20*  GLUCOSE 119*  --  80 118* 110*  BUN 81*  --  80* 82* 71*  CREATININE 3.50*  --  3.40* 3.11* 2.35*  CALCIUM 9.4  --  8.6* 8.2* 8.3*  MG  --   --  2.4 2.4 2.5*  PHOS  --   --   --  3.8 4.3   Estimated Creatinine Clearance: 25.6 mL/min (A) (by C-G formula based on SCr of 2.35 mg/dL (H)). Liver & Pancreas: Recent Labs  Lab 02/01/23 1720 02/02/23 0132 02/03/23 0330  AST ALT ALKPHOS 93 83 74  BILITOT 1.5* 1.4* 0.7  PROT 7.6 6.7 6.1*  ALBUMIN 3.3* 2.9* 2.6*   Recent Labs  Lab  02/01/23 1720  LIPASE 40   No results for input(s): "AMMONIA" in the last 168 hours. Diabetic: No results for input(s): "HGBA1C" in the last 72 hours. Recent Labs  Lab 02/03/23 1124 02/03/23 1516 02/03/23 2117 02/04/23 0805 02/04/23 1146  GLUCAP 127* 87 113* 142* 81   Cardiac Enzymes: Recent Labs  Lab 02/02/23 0132 02/03/23 0330  CKTOTAL 66 70   No results for input(s): "PROBNP" in the last 8760 hours. Coagulation Profile: No results for input(s): "INR", "PROTIME" in the last 168 hours. Thyroid Function Tests: Recent Labs    02/02/23 0132  TSH 0.731   Lipid Profile: No results for input(s): "CHOL", "HDL", "LDLCALC", "TRIG", "CHOLHDL", "LDLDIRECT" in the last 72 hours. Anemia Panel: Recent Labs    02/03/23 0330  VITAMINB12 211  FOLATE 9.3  FERRITIN 59  TIBC 235*  IRON 32*  RETICCTPCT 1.9   Urine analysis:    Component Value Date/Time   COLORURINE YELLOW 02/01/2023 2252   APPEARANCEUR HAZY (A) 02/01/2023 2252   LABSPEC 1.010 02/01/2023 2252   PHURINE 5.0 02/01/2023 2252   GLUCOSEU 50 (A) 02/01/2023 2252   HGBUR NEGATIVE 02/01/2023 2252   BILIRUBINUR NEGATIVE 02/01/2023 2252   KETONESUR NEGATIVE 02/01/2023 2252   PROTEINUR 100 (A) 02/01/2023 2252   UROBILINOGEN 0.2 04/11/2015 1658   NITRITE NEGATIVE 02/01/2023 2252   LEUKOCYTESUR NEGATIVE 02/01/2023 2252   Sepsis Labs: Invalid input(s): "PROCALCITONIN", "LACTICIDVEN"  Microbiology: Recent Results (from the past 240 hour(s))  Resp panel by RT-PCR (RSV, Flu A&B, Covid) Anterior Nasal Swab     Status: None   Collection Time: 02/01/23  7:22 PM   Specimen: Anterior Nasal Swab  Result Value Ref Range Status   SARS Coronavirus 2 by RT PCR NEGATIVE NEGATIVE Final   Influenza A by PCR NEGATIVE NEGATIVE Final   Influenza B by PCR NEGATIVE NEGATIVE Final    Comment: (NOTE) The Xpert Xpress SARS-CoV-2/FLU/RSV plus assay is intended as an aid in the diagnosis of influenza from Nasopharyngeal swab specimens  and should not be used as a sole basis for treatment. Nasal washings and aspirates are unacceptable for Xpert Xpress SARS-CoV-2/FLU/RSV testing.  Fact Sheet for Patients: BloggerCourse.com  Fact Sheet for Healthcare Providers: SeriousBroker.it  This test is not yet approved or cleared by the [place] FDA and has been authorized for detection and/or diagnosis of SARS-CoV-2 by FDA under an Emergency Use Authorization (EUA). This EUA will remain in effect (meaning this test can be used) for the duration of the COVID-19 declaration under Section 564(b)(1) of the Act,  21 U.S.C. section 360bbb-3(b)(1), unless the authorization is terminated or revoked.     Resp Syncytial Virus by PCR NEGATIVE NEGATIVE Final    Comment: (NOTE) Fact Sheet for Patients: BloggerCourse.com  Fact Sheet for Healthcare Providers: SeriousBroker.it  This test is not yet approved or cleared by the [place] FDA and has been authorized for detection and/or diagnosis of SARS-CoV-2 by FDA under an Emergency Use Authorization (EUA). This EUA will remain in effect (meaning this test can be used) for the duration of the COVID-19 declaration under Section 564(b)(1) of the Act, 21 U.S.C. section 360bbb-3(b)(1), unless the authorization is terminated or revoked.  Performed at [facility], [address], [place], [place] [address]     Radiology Studies: No results found.    Huey Bienenstock MD [facility]  If 7PM-7AM, please contact night-coverage [email or web address] 02/04/2023, 1:29 PM

## 2023-02-05 DIAGNOSIS — T07XXXD Unspecified multiple injuries, subsequent encounter: Secondary | ICD-10-CM | POA: Diagnosis not present

## 2023-02-05 DIAGNOSIS — I480 Paroxysmal atrial fibrillation: Secondary | ICD-10-CM | POA: Diagnosis not present

## 2023-02-05 DIAGNOSIS — T07XXXA Unspecified multiple injuries, initial encounter: Secondary | ICD-10-CM | POA: Diagnosis not present

## 2023-02-05 DIAGNOSIS — I10 Essential (primary) hypertension: Secondary | ICD-10-CM | POA: Diagnosis not present

## 2023-02-05 DIAGNOSIS — R279 Unspecified lack of coordination: Secondary | ICD-10-CM | POA: Diagnosis not present

## 2023-02-05 DIAGNOSIS — I5032 Chronic diastolic (congestive) heart failure: Secondary | ICD-10-CM | POA: Diagnosis not present

## 2023-02-05 DIAGNOSIS — I2510 Atherosclerotic heart disease of native coronary artery without angina pectoris: Secondary | ICD-10-CM | POA: Diagnosis not present

## 2023-02-05 DIAGNOSIS — I509 Heart failure, unspecified: Secondary | ICD-10-CM | POA: Diagnosis not present

## 2023-02-05 DIAGNOSIS — I4891 Unspecified atrial fibrillation: Secondary | ICD-10-CM | POA: Diagnosis not present

## 2023-02-05 DIAGNOSIS — E1122 Type 2 diabetes mellitus with diabetic chronic kidney disease: Secondary | ICD-10-CM | POA: Diagnosis not present

## 2023-02-05 DIAGNOSIS — R339 Retention of urine, unspecified: Secondary | ICD-10-CM | POA: Diagnosis not present

## 2023-02-05 DIAGNOSIS — R001 Bradycardia, unspecified: Secondary | ICD-10-CM | POA: Diagnosis not present

## 2023-02-05 DIAGNOSIS — N179 Acute kidney failure, unspecified: Secondary | ICD-10-CM | POA: Diagnosis not present

## 2023-02-05 DIAGNOSIS — I5030 Unspecified diastolic (congestive) heart failure: Secondary | ICD-10-CM | POA: Diagnosis not present

## 2023-02-05 DIAGNOSIS — M6281 Muscle weakness (generalized): Secondary | ICD-10-CM | POA: Diagnosis not present

## 2023-02-05 DIAGNOSIS — E785 Hyperlipidemia, unspecified: Secondary | ICD-10-CM | POA: Diagnosis not present

## 2023-02-05 DIAGNOSIS — F4321 Adjustment disorder with depressed mood: Secondary | ICD-10-CM | POA: Diagnosis not present

## 2023-02-05 DIAGNOSIS — I25119 Atherosclerotic heart disease of native coronary artery with unspecified angina pectoris: Secondary | ICD-10-CM | POA: Diagnosis not present

## 2023-02-05 DIAGNOSIS — R41841 Cognitive communication deficit: Secondary | ICD-10-CM | POA: Diagnosis not present

## 2023-02-05 DIAGNOSIS — Z7401 Bed confinement status: Secondary | ICD-10-CM | POA: Diagnosis not present

## 2023-02-05 DIAGNOSIS — E875 Hyperkalemia: Secondary | ICD-10-CM | POA: Diagnosis not present

## 2023-02-05 DIAGNOSIS — N184 Chronic kidney disease, stage 4 (severe): Secondary | ICD-10-CM | POA: Diagnosis not present

## 2023-02-05 DIAGNOSIS — Z7189 Other specified counseling: Secondary | ICD-10-CM | POA: Diagnosis not present

## 2023-02-05 DIAGNOSIS — E118 Type 2 diabetes mellitus with unspecified complications: Secondary | ICD-10-CM | POA: Diagnosis not present

## 2023-02-05 DIAGNOSIS — I129 Hypertensive chronic kidney disease with stage 1 through stage 4 chronic kidney disease, or unspecified chronic kidney disease: Secondary | ICD-10-CM | POA: Diagnosis not present

## 2023-02-05 DIAGNOSIS — E1165 Type 2 diabetes mellitus with hyperglycemia: Secondary | ICD-10-CM | POA: Diagnosis not present

## 2023-02-05 DIAGNOSIS — R296 Repeated falls: Secondary | ICD-10-CM | POA: Diagnosis not present

## 2023-02-05 DIAGNOSIS — D472 Monoclonal gammopathy: Secondary | ICD-10-CM | POA: Diagnosis not present

## 2023-02-05 LAB — BASIC METABOLIC PANEL
Anion gap: 9 (ref 5–15)
BUN: 59 mg/dL — ABNORMAL HIGH (ref 8–23)
CO2: 20 mmol/L — ABNORMAL LOW (ref 22–32)
Calcium: 8.6 mg/dL — ABNORMAL LOW (ref 8.9–10.3)
Chloride: 112 mmol/L — ABNORMAL HIGH (ref 98–111)
Creatinine, Ser: 2.26 mg/dL — ABNORMAL HIGH (ref 0.61–1.24)
GFR, Estimated: 29 mL/min — ABNORMAL LOW (ref >60)
Glucose, Bld: 110 mg/dL — ABNORMAL HIGH (ref 70–99)
Potassium: 4.3 mmol/L (ref 3.5–5.1)
Sodium: 141 mmol/L (ref 135–145)

## 2023-02-05 LAB — GLUCOSE, CAPILLARY
Glucose-Capillary: 110 mg/dL — ABNORMAL HIGH (ref 70–99)
Glucose-Capillary: 93 mg/dL (ref 70–99)

## 2023-02-05 MED ORDER — TORSEMIDE 20 MG PO TABS: 20.0000 mg | ORAL_TABLET | Freq: Every day | ORAL | 3 refills | Status: DC

## 2023-02-05 MED ORDER — INSULIN ASPART 100 UNIT/ML IJ SOLN: 0.0000 [IU] | Freq: Three times a day (TID) | INTRAMUSCULAR | 11 refills | Status: DC

## 2023-02-05 MED ORDER — ACETAMINOPHEN 325 MG PO TABS: 650.0000 mg | ORAL_TABLET | Freq: Four times a day (QID) | ORAL | Status: DC | PRN

## 2023-02-05 MED ORDER — INSULIN ASPART 100 UNIT/ML IJ SOLN: 0.0000 [IU] | Freq: Every day | INTRAMUSCULAR | 11 refills | Status: DC

## 2023-02-05 NOTE — Discharge Summary (Signed)
Physician Discharge Summary  Jake Gross ZOX:[id number] DOB: September 24, 1941 DOA: 02/01/2023  PCP: Eden Emms, NP  Admit date: 02/01/2023 Discharge date: 02/05/2023  Admitted From: (Home) Disposition:  SNF)  Recommendations for Outpatient Follow-up:  Please obtain BMP/CBC in 3 days Medication has been adjusted, metoprolol has been discontinued due to bradycardia with pauses, but if he develops A-fib with RVR, and heart rate becomes uncontrolled, then would recommend resuming metoprolol at a lower dose initially 12.5 twice daily, but he is currently with heart rate in the 50s and 60s with no beta-blockers. I would avoid strict blood pressure control in this 82 year old male with high risk of fall, his hydrochlorothiazide and metoprolol both has been discontinued, blood pressure is low but on the higher side upon discharge. Monitor volume status closely, as torsemide has been decreased at time of discharge especially with this patient with recurrent admission with orthostasis, AKI and dehydration   Discharge Condition: (Stable) CODE STATUS: (FULL) Diet recommendation: Heart Healthy / Carb Modified   Brief/Interim Summary:  82 year old M with PMH of diastolic CHF, CKD-4, DM-2, HTN and recent hospitalization from 4/10-4/20 for nausea, vomiting and fall for which she was treated with IV hydration and antiemetics and returning with nausea and vomiting for about 7 days, generalized weakness and some shortness of breath. Admitted for AKI on CKD-4 and generalized weakness and fall at home. Cr 3.4 (baseline 2.7).  As well orthostatics, as well hospital stay with noted for bradycardia and pauses.  CT also concerning for liver cirrhosis.      AKI on CKD-4/azotemia: - Likely prerenal from GI loss and concurrent use of diuretics, as well can be related to orthostasis/low blood pressure from failure to thrive and multiple antihypertensive medications, baseline appears to be at 2.5, peaked at 3.5 on  admission, AKI has resolved and renal function back to baseline -Discontinue Farxiga at time of discharge -Discontinue hydrochlorothiazide at time of discharge -Function back at baseline, he appears to be euvolemic after receiving IV fluid during hospital stay, he will be discharged on decreased dose torsemide(will decrease from 20 mg twice daily to once daily) will blood pressure medications has been adjusted, will have to tolerate higher blood pressure readings in this frail elderly male.   Orthostasis -Due to multiple antihypertensive medications, dehydration volume depletion. -Antihypertensive medications has been adjusted initially hydralazine and Imdur has been lowered, Toprol-XL has been discontinued in the setting of bradycardia .  Hydrochlorothiazide has been discontinued due to dehydration.  Now blood pressure on the higher side, so Imdur and hydralazine has been resumed at home dose, and continue amlodipine at home dose as well. -Treated with IV albumin during hospital stay. -Recent random cortisol was 13.     Bradycardia with multiple pauses -Patient was noted on telemetry to have bradycardia heart rate in the 50s, dips to 30s while sleeping, as well pauses up to 2.5 seconds, so his metoprolol has been discontinued, improved, averaging heart rate in the 50s to 60s, with much less pauses, so we will continue holding metoprolol at time of discharge.   Intractable nausea and vomiting:  -This appears to be resolved, and likely gastroparesis given her Acuson not lasting, not accompanied by abdominal pain -this currently resolved -CT abdomen pelvis on admission with possible evidence of mild diverticulitis, but no clinical concerns on physical exam or history, will monitor off antibiotics   Recurrent fall/generalized weakness: Patient lives alone.  Uses rolling walker at baseline.  Reportedly had a fall on 4/15.  Has bruises  on his arms and left leg. -Fall precaution -PT/OT consulted, plan  for SNF placement   Paroxysmal A-fib: Currently in sinus rhythm.  On metoprolol and low-dose Eliquis at home.  Has frequent falls. -Discontinue metoprolol given bradycardia and multiple pauses and orthostasis -Continue Eliquis, especially he is going to SNF, but he continues to have falls once discharged home , then need to consider to stop anticoagulation -Needs risk and benefit discussion of discharge about anticoagulation  Essential hypertension:  -please see above discussion under orthostasis -Toprol has been discontinued due to bradycardia -  hydrochlorothiazide had been discontinued due to dehydration and AKI  -Continue with home dose amlodipine, hydralazine and Imdur   Controlled IDDM-2 with hyperglycemia: A1c 6.4% on 11/09/2022 -Continue current insulin regimen -Discontinue Farxiga on discharge   Cholelithiasis/mild hyperbilirubinemia/possible liver cirrhosis: Could be contributing to nausea and vomiting but symptoms have resolved now. -Continue monitoring   Prolonged QTc: QTc 530, and improved to 467  -Optimize electrolytes   History of CAD/elevated troponin: Elevated troponin likely demand ischemia and delayed clearance. -Continue home medications   Chronic diastolic CHF:  - Was dehydrated on exam.  Appears euvolemic this morning.  No cardiopulmonary symptoms. -Diuretics has been held this admission due to AKI and dehydration, he is euvolemic currently, will discharge on torsemide 20 mg oral daily( half previous dose)   Hyperlipidemia -Continue Crestor.   Hypokalemia: Likely due to GI loss and [place]. -repelted   Multiple skin wounds and lacerations from falls -Continue with wound care at time of discharge  Discharge Diagnoses:  Principal Problem:   Acute-on-chronic kidney injury Active Problems:   Acute renal failure 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   Chronic diastolic CHF (congestive heart failure)   Paroxysmal atrial fibrillation   Diabetes  mellitus type 2, insulin dependent   CAD (coronary artery disease)   Essential hypertension   HLD (hyperlipidemia)   Benign hypertensive kidney disease with chronic kidney disease    Discharge Instructions  Discharge Instructions     Diet - low sodium heart healthy   Complete by: As directed    Discharge instructions   Complete by: As directed    Follow with Primary MD /SNF physician  Get CBC, CMP,  checked  by Primary MD next visit.    Activity: As tolerated with Full fall precautions use walker/cane & assistance as needed   Disposition SNF   Diet: Heart Healthy / Carb modified , with feeding assistance and aspiration precautions.  For Heart failure patients - Check your Weight same time everyday, if you gain over 2 pounds, or you develop in leg swelling, experience more shortness of breath or chest pain, call your Primary MD immediately. Follow Cardiac Low Salt Diet and 1.5 lit/day fluid restriction.   On your next visit with your primary care physician please Get Medicines reviewed and adjusted.   Please request your Prim.MD to go over all Hospital Tests and Procedure/Radiological results at the follow up, please get all Hospital records sent to your Prim MD by signing hospital release before you go home.   If you experience worsening of your admission symptoms, develop shortness of breath, life threatening emergency, suicidal or homicidal thoughts you must seek medical attention immediately by calling 911 or calling your MD immediately  if symptoms less severe.  You Must read complete instructions/literature along with all the possible adverse reactions/side effects for all the Medicines you take and that have been prescribed to you. Take any new Medicines after you have completely  understood and accpet all the possible adverse reactions/side effects.   Do not drive, operating heavy machinery, perform activities at heights, swimming or participation in water activities  or provide baby sitting services if your were admitted for syncope or siezures until you have seen by Primary MD or a Neurologist and advised to do so again.  Do not drive when taking Pain medications.    Do not take more than prescribed Pain, Sleep and Anxiety Medications  Special Instructions: If you have smoked or chewed Tobacco  in the last 2 yrs please stop smoking, stop any regular Alcohol  and or any Recreational drug use.  Wear Seat belts while driving.   Please note  You were cared for by a hospitalist during your hospital stay. If you have any questions about your discharge medications or the care you received while you were in the hospital after you are discharged, you can call the unit and asked to speak with the hospitalist on call if the hospitalist that took care of you is not available. Once you are discharged, your primary care physician will handle any further medical issues. Please note that NO REFILLS for any discharge medications will be authorized once you are discharged, as it is imperative that you return to your primary care physician (or establish a relationship with a primary care physician if you do not have one) for your aftercare needs so that they can reassess your need for medications and monitor your lab values.   Discharge wound care:   Complete by: As directed    Clean all wounds (R arm, L elbow, L knee) with NS, apply Xeroform gauze [place] [phone]) cut to fit wound beds daily. Cover with silicone foam or Telfa and Kerlix whichever works best.  May lift foam dressings to replace Xeroform.  Change foam dressings q3 days and prn soiling.   Increase activity slowly   Complete by: As directed       Allergies as of 02/05/2023   No Known Allergies      Medication List     STOP taking these medications    chlorthalidone 25 MG tablet Commonly known as: HYGROTON   Farxiga 10 MG Tabs tablet Generic drug: dapagliflozin propanediol   metoprolol tartrate 25  MG tablet Commonly known as: LOPRESSOR       TAKE these medications    acetaminophen 325 MG tablet Commonly known as: TYLENOL Take 2 tablets (650 mg total) by mouth every 6 (six) hours as needed for mild pain (or Fever >/= 101).   amLODipine 5 MG tablet Commonly known as: NORVASC TAKE 1 TABLET (5 MG TOTAL) BY MOUTH DAILY. PLEASE CALL TO SCHEDULE AN OVERDUE APPOINTMENT What changed: See the new instructions.   apixaban 2.5 MG Tabs tablet Commonly known as: ELIQUIS Take 1 tablet (2.5 mg total) by mouth 2 (two) times daily.   BD Pen Needle Nano 2nd Gen 32G X 4 MM Misc Generic drug: Insulin Pen Needle Use with insulin pen as directed   hydrALAZINE 100 MG tablet Commonly known as: APRESOLINE Take 1 tablet (100 mg total) by mouth 3 (three) times daily. Please call to schedule an overdue appointment with Dr. Clifton James for refills, [phone], thank you. 1st attempt.   insulin aspart 100 UNIT/ML injection Commonly known as: novoLOG Inject 0-15 Units into the skin 3 (three) times daily with meals.   insulin aspart 100 UNIT/ML injection Commonly known as: novoLOG Inject 0-5 Units into the skin at bedtime.   insulin glargine-yfgn 100  UNIT/ML Pen Commonly known as: SEMGLEE Inject 55 Units into the skin daily.   isosorbide mononitrate 60 MG 24 hr tablet Commonly known as: IMDUR TAKE 1 TABLET BY MOUTH EVERY DAY (NEED OFFICE VISIT) What changed: See the new instructions.   onetouch ultrasoft lancets Use as instructed   rosuvastatin 20 MG tablet Commonly known as: CRESTOR TAKE 1 TABLET BY MOUTH AT BEDTIME. SCHEDULE PHYSICAL EXAM What changed:  how much to take how to take this when to take this additional instructions   tamsulosin 0.4 MG Caps capsule Commonly known as: FLOMAX TAKE 1 CAPSULE BY MOUTH EVERY DAY   torsemide 20 MG tablet Commonly known as: DEMADEX Take 1 tablet (20 mg total) by mouth daily. Start taking on: February 06, 2023 What changed: when to take  this               Discharge Care Instructions  (From admission, onward)           Start     Ordered   02/05/23 0000  Discharge wound care:       Comments: Clean all wounds (R arm, L elbow, L knee) with NS, apply Xeroform gauze [place] [phone]) cut to fit wound beds daily. Cover with silicone foam or Telfa and Kerlix whichever works best.  May lift foam dressings to replace Xeroform.  Change foam dressings q3 days and prn soiling.   02/05/23 1058            Contact information for after-discharge care     Destination     [facility] Preferred SNF .   Service: Skilled Nursing Contact information: [address] [place] [address] [phone]                    No Known Allergies  Consultations: None   Procedures/Studies: ECHOCARDIOGRAM COMPLETE  Result Date: 02/04/2023    ECHOCARDIOGRAM REPORT   Patient Name:   TAMMIE ELLSWORTH Baer Date of Exam: 02/04/2023 Medical Rec #:  [id number]         Height:       67.0 in Accession #:    [id number]        Weight:       180.0 lb Date of Birth:  June 20, 1941         BSA:          1.934 m Patient Age:    82 years          BP:           172/80 mmHg Patient Gender: M                 HR:           56 bpm. Exam Location:  Inpatient Procedure: 2D Echo Indications:    other abnormalities of the heart. CHF.  History:        Patient has prior history of Echocardiogram examinations, most                 recent 08/02/2021. CHF, chronic kidney; Risk Factors:Diabetes                 and Hypertension.  Sonographer:    Delcie Roch RDCS Referring Phys: [id number] Boyce Medici GONFA  Sonographer Comments: Image acquisition challenging due to respiratory motion. IMPRESSIONS  1. Left ventricular ejection fraction, by estimation, is 60 to 65%. The left ventricle has normal function. The left ventricle has no regional wall motion abnormalities. There is  mild left ventricular hypertrophy. Left ventricular  diastolic parameters are indeterminate.  2. Right ventricular systolic function is mildly reduced. The right ventricular size is normal. Tricuspid regurgitation signal is inadequate for assessing PA pressure.  3. Left atrial size was mildly dilated.  4. The mitral valve is degenerative. Trivial mitral valve regurgitation. No evidence of mitral stenosis.  5. The aortic valve is tricuspid. There is mild calcification of the aortic valve. There is mild thickening of the aortic valve. Aortic valve regurgitation is trivial. No aortic stenosis is present.  6. Aortic dilatation noted. There is borderline dilatation of the ascending aorta, measuring 41 mm.  7. The inferior vena cava is normal in size with greater than 50% respiratory variability, suggesting right atrial pressure of 3 mmHg. FINDINGS  Left Ventricle: Left ventricular ejection fraction, by estimation, is 60 to 65%. The left ventricle has normal function. The left ventricle has no regional wall motion abnormalities. The left ventricular internal cavity size was normal in size. There is  mild left ventricular hypertrophy. Abnormal (paradoxical) septal motion consistent with post-operative status. Left ventricular diastolic parameters are indeterminate. Right Ventricle: The right ventricular size is normal. No increase in right ventricular wall thickness. Right ventricular systolic function is mildly reduced. Tricuspid regurgitation signal is inadequate for assessing PA pressure. Left Atrium: Left atrial size was mildly dilated. Right Atrium: Right atrial size was normal in size. Pericardium: There is no evidence of pericardial effusion. Mitral Valve: The mitral valve is degenerative in appearance. Mild to moderate mitral annular calcification. Trivial mitral valve regurgitation. No evidence of mitral valve stenosis. Tricuspid Valve: The tricuspid valve is normal in structure. Tricuspid valve regurgitation is trivial. No evidence of tricuspid stenosis. Aortic  Valve: The aortic valve is tricuspid. There is mild calcification of the aortic valve. There is mild thickening of the aortic valve. Aortic valve regurgitation is trivial. No aortic stenosis is present. Pulmonic Valve: The pulmonic valve was normal in structure. Pulmonic valve regurgitation is trivial. No evidence of pulmonic stenosis. Aorta: Aortic dilatation noted. There is borderline dilatation of the ascending aorta, measuring 41 mm. Venous: The inferior vena cava is normal in size with greater than 50% respiratory variability, suggesting right atrial pressure of 3 mmHg. IAS/Shunts: No atrial level shunt detected by color flow Doppler.  LEFT VENTRICLE PLAX 2D LVIDd:         4.00 cm   Diastology LVIDs:         3.00 cm   LV e' medial:    4.57 cm/s LV PW:         1.30 cm   LV E/e' medial:  22.1 LV IVS:        1.20 cm   LV e' lateral:   12.80 cm/s LVOT diam:     2.30 cm   LV E/e' lateral: 7.9 LV SV:         119 LV SV Index:   62 LVOT Area:     4.15 cm  RIGHT VENTRICLE             IVC RV Basal diam:  2.90 cm     IVC diam: 1.90 cm RV S prime:     10.10 cm/s TAPSE (M-mode): 1.2 cm LEFT ATRIUM             Index        RIGHT ATRIUM           Index LA diam:        4.10 cm 2.12 cm/m  RA Area:     16.60 cm LA Vol (A2C):   90.0 ml 46.54 ml/m  RA Volume:   42.60 ml  22.03 ml/m LA Vol (A4C):   64.0 ml 33.10 ml/m LA Biplane Vol: 77.3 ml 39.98 ml/m  AORTIC VALVE LVOT Vmax:   150.00 cm/s LVOT Vmean:  85.600 cm/s LVOT VTI:    0.287 m  AORTA Ao Root diam: 3.60 cm Ao Asc diam:  4.10 cm MITRAL VALVE MV Area (PHT): 3.20 cm     SHUNTS MV Decel Time: 237 msec     Systemic VTI:  0.29 m MV E velocity: 101.00 cm/s  Systemic Diam: 2.30 cm MV A velocity: 96.40 cm/s MV E/A ratio:  1.05 [name] Brass MD Electronically signed by [name] Brass MD Signature Date/Time: 02/04/2023/5:07:07 PM    Final    DG Chest Port 1 View  Result Date: 02/01/2023 CLINICAL DATA:  falls, emesis EXAM: PORTABLE CHEST 1 VIEW COMPARISON:  Chest x-ray  01/24/2023 FINDINGS: The heart and mediastinal contours are unchanged. Wireless cardiac device with chest. No focal consolidation. No pulmonary edema. No pleural effusion. No pneumothorax. No acute osseous abnormality.  Sternotomy wires are intact. IMPRESSION: No active disease. Electronically Signed   By: Tish Frederickson M.D.   On: 02/01/2023 19:43   CT Head Wo Contrast  Result Date: 02/01/2023 CLINICAL DATA:  Head trauma EXAM: CT HEAD WITHOUT CONTRAST CT CERVICAL SPINE WITHOUT CONTRAST TECHNIQUE: Multidetector CT imaging of the head and cervical spine was performed following the standard protocol without intravenous contrast. Multiplanar CT image reconstructions of the cervical spine were also generated. RADIATION DOSE REDUCTION: This exam was performed according to the departmental dose-optimization program which includes automated exposure control, adjustment of the mA and/or kV according to patient size and/or use of iterative reconstruction technique. COMPARISON:  None Available. FINDINGS: CT HEAD FINDINGS Brain: There is no mass, hemorrhage or extra-axial collection. There is generalized atrophy without lobar predilection. There is hypoattenuation of the periventricular white matter, most commonly indicating chronic ischemic microangiopathy. Old left parietal infarct. Vascular: Atherosclerotic calcification of the vertebral and internal carotid arteries at the skull base. No abnormal hyperdensity of the major intracranial arteries or dural venous sinuses. Skull: The visualized skull base, calvarium and extracranial soft tissues are normal. Sinuses/Orbits: No fluid levels or advanced mucosal thickening of the visualized paranasal sinuses. No mastoid or middle ear effusion. The orbits are normal. CT CERVICAL SPINE FINDINGS Alignment: No static subluxation. Facets are aligned. Occipital condyles are normally positioned. Skull base and vertebrae: No acute fracture. Soft tissues and spinal canal: No  prevertebral fluid or swelling. No visible canal hematoma. Disc levels: No advanced spinal canal or neural foraminal stenosis. Upper chest: No pneumothorax, pulmonary nodule or pleural effusion. Other: Normal visualized paraspinal cervical soft tissues. IMPRESSION: 1. No acute intracranial abnormality. 2. 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 3. No acute fracture or static subluxation of the cervical spine. Electronically Signed   By: Deatra Robinson M.D.   On: 02/01/2023 19:34   CT Cervical Spine Wo Contrast  Result Date: 02/01/2023 CLINICAL DATA:  Head trauma EXAM: CT HEAD WITHOUT CONTRAST CT CERVICAL SPINE WITHOUT CONTRAST TECHNIQUE: Multidetector CT imaging of the head and cervical spine was performed following the standard protocol without intravenous contrast. Multiplanar CT image reconstructions of the cervical spine were also generated. RADIATION DOSE REDUCTION: This exam was performed according to the departmental dose-optimization program which includes automated exposure control, adjustment of the mA and/or kV according to patient size and/or  use of iterative reconstruction technique. COMPARISON:  None Available. FINDINGS: CT HEAD FINDINGS Brain: There is no mass, hemorrhage or extra-axial collection. There is generalized atrophy without lobar predilection. There is hypoattenuation of the periventricular white matter, most commonly indicating chronic ischemic microangiopathy. Old left parietal infarct. Vascular: Atherosclerotic calcification of the vertebral and internal carotid arteries at the skull base. No abnormal hyperdensity of the major intracranial arteries or dural venous sinuses. Skull: The visualized skull base, calvarium and extracranial soft tissues are normal. Sinuses/Orbits: No fluid levels or advanced mucosal thickening of the visualized paranasal sinuses. No mastoid or middle ear effusion. The orbits are normal. CT CERVICAL SPINE FINDINGS Alignment: No static  subluxation. Facets are aligned. Occipital condyles are normally positioned. Skull base and vertebrae: No acute fracture. Soft tissues and spinal canal: No prevertebral fluid or swelling. No visible canal hematoma. Disc levels: No advanced spinal canal or neural foraminal stenosis. Upper chest: No pneumothorax, pulmonary nodule or pleural effusion. Other: Normal visualized paraspinal cervical soft tissues. IMPRESSION: 1. No acute intracranial abnormality. 2. 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 3. No acute fracture or static subluxation of the cervical spine. Electronically Signed   By: Deatra Robinson M.D.   On: 02/01/2023 19:34   CT ABDOMEN PELVIS WO CONTRAST  Result Date: 02/01/2023 CLINICAL DATA:  Abdominal pain, nausea/vomiting EXAM: CT ABDOMEN AND PELVIS WITHOUT CONTRAST TECHNIQUE: Multidetector CT imaging of the abdomen and pelvis was performed following the standard protocol without IV contrast. RADIATION DOSE REDUCTION: This exam was performed according to the departmental dose-optimization program which includes automated exposure control, adjustment of the mA and/or kV according to patient size and/or use of iterative reconstruction technique. COMPARISON:  09/27/2022 FINDINGS: Lower chest: Lung bases are clear. Hepatobiliary: Cirrhosis. Large lamellated gallstone measuring 3.6 cm. No associated inflammatory changes. No intrahepatic or extrahepatic ductal dilatation. Pancreas: Within normal limits. Spleen: Within normal limits. Adrenals/Urinary Tract: Adrenal glands are within normal limits. Kidneys are within normal limits.  No hydronephrosis. Mildly thick-walled bladder, although underdistended. Stomach/Bowel: Stomach is within normal limits. No evidence of bowel obstruction. Normal appendix (series 3/image 85). Mild pericolonic inflammatory changes in the left lower quadrant suggesting very mild proximal sigmoid diverticulitis (series 3/image 84), without associated fluid  collection/abscess. No free air to suggest macroscopic perforation. Vascular/Lymphatic: No evidence of abdominal aortic aneurysm. Atherosclerotic calcifications of the abdominal aorta and branch vessels. No suspicious abdominopelvic lymphadenopathy. Reproductive: Prostatomegaly, with enlargement of the central gland indenting the base of the bladder, suggesting BPH. Other: No abdominopelvic ascites. Musculoskeletal: Degenerative changes of the visualized thoracolumbar spine. IMPRESSION: Very mild proximal sigmoid diverticulitis, without associated fluid collection/abscess. No free air. Cholelithiasis, without associated inflammatory changes. Cirrhosis. Prostatomegaly, suggesting BPH. Electronically Signed   By: Charline Bills M.D.   On: 02/01/2023 19:16   US RENAL  Result Date: 01/25/2023 CLINICAL DATA:  Acute renal injury EXAM: RENAL / URINARY TRACT ULTRASOUND COMPLETE COMPARISON:  12/01/2019, 09/27/2022 FINDINGS: Right Kidney: Renal measurements: 10.8 x 6.0 x 5.3 cm. = volume: 178 mL. Mild increased echogenicity is noted. No mass or hydronephrosis is noted. Left Kidney: Renal measurements: 11.5 x 5.9 x 5.6 cm. = volume: 198 mL. Mild increased echogenicity is noted. No mass lesion or hydronephrosis is noted. Bladder: Appears normal for degree of bladder distention. Other: None. IMPRESSION: Increased echogenicity consistent with medical renal disease. No other focal abnormality is noted. Electronically Signed   By: Alcide Clever M.D.   On: 01/25/2023 00:03   DG Chest Portable 1 View  Result Date:  01/24/2023 CLINICAL DATA:  Shortness of breath EXAM: PORTABLE CHEST 1 VIEW COMPARISON:  09/27/2022 FINDINGS: Cardiac size is within normal limits. 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 There are no signs of pulmonary edema or focal pulmonary consolidation. There is poor inspiration. There is no pleural effusion or pneumothorax. There is a implanted cardiac monitoring device in left chest wall. IMPRESSION:  There are no new infiltrates or signs of pulmonary edema. Electronically Signed   By: Ernie Avena M.D.   On: 01/24/2023 20:32   CT HEAD WO CONTRAST ( )  Result Date: 01/24/2023 CLINICAL DATA:  Recent fall with headaches, initial encounter EXAM: CT HEAD WITHOUT CONTRAST TECHNIQUE: Contiguous axial images were obtained from the base of the skull through the vertex without intravenous contrast. RADIATION DOSE REDUCTION: This exam was performed according to the departmental dose-optimization program which includes automated exposure control, adjustment of the mA and/or kV according to patient size and/or use of iterative reconstruction technique. COMPARISON:  09/27/2022 FINDINGS: Brain: No evidence of acute infarction, hemorrhage, hydrocephalus, extra-axial collection or mass lesion/mass effect. 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 Generalized atrophic changes are noted. Mild chronic white matter ischemic changes are seen. Vascular: No hyperdense vessel or unexpected calcification. Skull: Normal. Negative for fracture or focal lesion. Sinuses/Orbits: No acute finding. Other: None. IMPRESSION: Chronic atrophic and ischemic changes without acute abnormality. Electronically Signed   By: Alcide Clever M.D.   On: 01/24/2023 19:31      Subjective:  No significant events Overnight, patient reports he feels much better and stronger, and asking about when he can go to rehab. Discharge Exam: Vitals:   02/05/23 0823 02/05/23 0856  BP: (!) 165/73 (!) 145/59  Pulse:  68  Resp:  18  Temp: 97.9 F (36.6 C)   SpO2:  96%   Vitals:   02/05/23 0200 02/05/23 0400 02/05/23 0823 02/05/23 0856  BP: (!) 154/73 135/65 (!) 165/73 (!) 145/59  Pulse: 60 62  68  Resp: Temp:  97.8 F (36.6 C) 97.9 F (36.6 C)   TempSrc:  Oral    SpO2: 96% 95%  96%  Weight:      Height:        General: Pt is alert, awake, not in acute distress Cardiovascular: RRR, S1/S2 +, no rubs, no  gallops Respiratory: CTA bilaterally, no wheezing, no rhonchi Abdominal: Soft, NT, ND, bowel sounds + Extremities: no edema, no cyanosis    The results of significant diagnostics from this hospitalization (including imaging, microbiology, ancillary and laboratory) are listed below for reference.     Microbiology: Recent Results (from the past 240 hour(s))  Resp panel by RT-PCR (RSV, Flu A&B, Covid) Anterior Nasal Swab     Status: None   Collection Time: 02/01/23  7:22 PM   Specimen: Anterior Nasal Swab  Result Value Ref Range Status   SARS Coronavirus 2 by RT PCR NEGATIVE NEGATIVE Final   Influenza A by PCR NEGATIVE NEGATIVE Final   Influenza B by PCR NEGATIVE NEGATIVE Final    Comment: (NOTE) The Xpert Xpress SARS-CoV-2/FLU/RSV plus assay is intended as an aid in the diagnosis of influenza from Nasopharyngeal swab specimens and should not be used as a sole basis for treatment. Nasal washings and aspirates are unacceptable for Xpert Xpress SARS-CoV-2/FLU/RSV testing.  Fact Sheet for Patients: BloggerCourse.com  Fact Sheet for Healthcare Providers: SeriousBroker.it  This test is not yet approved or cleared by the [place] and has been authorized  for detection and/or diagnosis of SARS-CoV-2 by FDA under an Emergency Use Authorization (EUA). This EUA will remain in effect (meaning this test can be used) for the duration of the COVID-19 declaration under Section 564(b)(1) of the Act, 21 U.S.C. section 360bbb-3(b)(1), unless the authorization is terminated or revoked.     Resp Syncytial Virus by PCR NEGATIVE NEGATIVE Final    Comment: (NOTE) Fact Sheet for Patients: BloggerCourse.com  Fact Sheet for Healthcare Providers: SeriousBroker.it  This test is not yet approved or cleared by the [place] FDA and has been authorized for detection and/or diagnosis of  SARS-CoV-2 by FDA under an Emergency Use Authorization (EUA). This EUA will remain in effect (meaning this test can be used) for the duration of the COVID-19 declaration under Section 564(b)(1) of the Act, 21 U.S.C. section 360bbb-3(b)(1), unless the authorization is terminated or revoked.  Performed at [facility], [address], [place], [place] [address]      Labs: BNP (last 3 results) Recent Labs    09/27/22 2210 01/24/23 1903  BNP 195.8* 117.4*   Basic Metabolic Panel: Recent Labs  Lab 02/01/23 1720 02/01/23 2057 02/02/23 0132 02/03/23 0330 02/04/23 0341 02/05/23 0432  NA 138 139 136 131* 136 141  K 3.9 3.7 3.1* 4.0 3.6 4.3  CL 98  --  102 102 107 112*  CO2 14*  --  19* 19* 20* 20*  GLUCOSE 119*  --  80 118* 110* 110*  BUN 81*  --  80* 82* 71* 59*  CREATININE 3.50*  --  3.40* 3.11* 2.35* 2.26*  CALCIUM 9.4  --  8.6* 8.2* 8.3* 8.6*  MG  --   --  2.4 2.4 2.5*  --   PHOS  --   --   --  3.8 4.3  --    Liver Function Tests: Recent Labs  Lab 02/01/23 1720 02/02/23 0132 02/03/23 0330  AST 26 18 17   ALT 13 12 11   ALKPHOS 93 83 74  BILITOT 1.5* 1.4* 0.7  PROT 7.6 6.7 6.1*  ALBUMIN 3.3* 2.9* 2.6*   Recent Labs  Lab 02/01/23 1720  LIPASE 40   No results for input(s): "AMMONIA" in the last 168 hours. CBC: Recent Labs  Lab 02/01/23 1720 02/01/23 2057 02/02/23 0132 02/03/23 0330 02/04/23 0341  WBC 11.7*  --  10.4 10.7* 9.2  NEUTROABS  --   --  7.5  --   --   HGB 14.9 14.3 13.0 11.6* 11.2*  HCT 44.8 42.0 39.5 36.2* 34.9*  MCV 80.7  --  80.0 81.7 82.7  PLT 332  --  257 221 193   Cardiac Enzymes: Recent Labs  Lab 02/02/23 0132 02/03/23 0330  CKTOTAL 66 70   BNP: Invalid input(s): "POCBNP" CBG: Recent Labs  Lab 02/04/23 0805 02/04/23 1146 02/04/23 1550 02/04/23 2102 02/05/23 0744  GLUCAP 142* 81 109* 106* 110*   D-Dimer No results for input(s): "DDIMER" in the last 72 hours. Hgb A1c No results for input(s): "HGBA1C" in the  last 72 hours. Lipid Profile No results for input(s): "CHOL", "HDL", "LDLCALC", "TRIG", "CHOLHDL", "LDLDIRECT" in the last 72 hours. Thyroid function studies No results for input(s): "TSH", "T4TOTAL", "T3FREE", "THYROIDAB" in the last 72 hours.  Invalid input(s): "FREET3" Anemia work up Recent Labs    02/03/23 0330  VITAMINB12 211  FOLATE 9.3  FERRITIN 59  TIBC 235*  IRON 32*  RETICCTPCT 1.9   Urinalysis    Component Value Date/Time   COLORURINE YELLOW 02/01/2023  2252   APPEARANCEUR HAZY (A) 02/01/2023 2252   LABSPEC 1.010 02/01/2023 2252   PHURINE 5.0 02/01/2023 2252   GLUCOSEU 50 (A) 02/01/2023 2252   HGBUR NEGATIVE 02/01/2023 2252   BILIRUBINUR NEGATIVE 02/01/2023 2252   KETONESUR NEGATIVE 02/01/2023 2252   PROTEINUR 100 (A) 02/01/2023 2252   UROBILINOGEN 0.2 04/11/2015 1658   NITRITE NEGATIVE 02/01/2023 2252   LEUKOCYTESUR NEGATIVE 02/01/2023 2252   Sepsis Labs Recent Labs  Lab 02/01/23 1720 02/02/23 0132 02/03/23 0330 02/04/23 0341  WBC 11.7* 10.4 10.7* 9.2   Microbiology Recent Results (from the past 240 hour(s))  Resp panel by RT-PCR (RSV, Flu A&B, Covid) Anterior Nasal Swab     Status: None   Collection Time: 02/01/23  7:22 PM   Specimen: Anterior Nasal Swab  Result Value Ref Range Status   SARS Coronavirus 2 by RT PCR NEGATIVE NEGATIVE Final   Influenza A by PCR NEGATIVE NEGATIVE Final   Influenza B by PCR NEGATIVE NEGATIVE Final    Comment: (NOTE) The Xpert Xpress SARS-CoV-2/FLU/RSV plus assay is intended as an aid in the diagnosis of influenza from Nasopharyngeal swab specimens and should not be used as a sole basis for treatment. Nasal washings and aspirates are unacceptable for Xpert Xpress SARS-CoV-2/FLU/RSV testing.  Fact Sheet for Patients: BloggerCourse.com  Fact Sheet for Healthcare Providers: SeriousBroker.it  This test is not yet approved or cleared by the [place] FDA and has  been authorized for detection and/or diagnosis of SARS-CoV-2 by FDA under an Emergency Use Authorization (EUA). This EUA will remain in effect (meaning this test can be used) for the duration of the COVID-19 declaration under Section 564(b)(1) of the Act, 21 U.S.C. section 360bbb-3(b)(1), unless the authorization is terminated or revoked.     Resp Syncytial Virus by PCR NEGATIVE NEGATIVE Final    Comment: (NOTE) Fact Sheet for Patients: BloggerCourse.com  Fact Sheet for Healthcare Providers: SeriousBroker.it  This test is not yet approved or cleared by the [place] FDA and has been authorized for detection and/or diagnosis of SARS-CoV-2 by FDA under an Emergency Use Authorization (EUA). This EUA will remain in effect (meaning this test can be used) for the duration of the COVID-19 declaration under Section 564(b)(1) of the Act, 21 U.S.C. section 360bbb-3(b)(1), unless the authorization is terminated or revoked.  Performed at [facility], [address], [place], [place] [address]      Time coordinating discharge: Over 30 minutes  SIGNED:   Huey Bienenstock, MD  [facility] 02/05/2023, 11:00 AM Pager   If 7PM-7AM, please contact night-coverage [email or web address] Password [email or web address]

## 2023-02-05 NOTE — Plan of Care (Signed)

## 2023-02-05 NOTE — TOC Progression Note (Addendum)
Transition of Care [facility]) - Progression Note    Patient Details  Name: Jake Gross MRN: [id number] Date of Birth: [birth year]  Transition of [facility]) CM/SW Contact  Mearl Latin, LCSW Phone Number: 02/05/2023, 11:09 AM  Clinical Narrative:    CSW met with patient to make him aware that [facility] can accept him today and to determine who can sign admission paperwork. Patient stated he did not choose [facility] and that he chose [place] [facility] [address]. CSW reviewed notes with patient from prior CSW but patient insisted that he chose [name] due to distance as his son works in [place] and his daughter lives in [place]. CSW contacted [name] and they confirmed they can accept patient today. Patient in agreement with [facility] for transport. Patient stated he is calling his children now to let them know.    Expected Discharge Plan: Skilled Nursing Facility Barriers to Discharge: Barriers Resolved  Expected Discharge Plan and Services In-house Referral: Clinical Social Work   Post Acute Care Choice: Skilled Nursing Facility Living arrangements for the past 2 months: Single Family Home Expected Discharge Date: 02/05/23                                     Social Determinants of Health (SDOH) Interventions SDOH Screenings   Food Insecurity: No Food Insecurity (02/01/2023)  Housing: Low Risk  (02/01/2023)  Transportation Needs: No Transportation Needs (02/01/2023)  Utilities: Not At Risk (02/01/2023)  Alcohol Screen: Low Risk  (12/05/2022)  Depression (PHQ2-9): Low Risk  (12/05/2022)  Financial Resource Strain: Low Risk  (12/05/2022)  Physical Activity: Inactive (12/05/2022)  Social Connections: Socially Isolated (12/05/2022)  Stress: No Stress Concern Present (12/05/2022)  Tobacco Use: Medium Risk (02/01/2023)    Readmission Risk Interventions    02/05/2023   11:08 AM  Readmission Risk Prevention Plan  Transportation Screening Complete  Medication Review (RN Care  Manager) Complete  PCP or Specialist appointment within 3-5 days of discharge Complete  HRI or Home Care Consult Complete  SW Recovery Care/Counseling Consult Complete  Palliative Care Screening Not Applicable  Skilled Nursing Facility Complete

## 2023-02-05 NOTE — TOC Transition Note (Signed)
Transition of Care [facility]) - CM/SW Discharge Note   Patient Details  Name: Jake Gross MRN: [id number] Date of Birth: 1941/09/28  Transition of Care [facility]) CM/SW Contact:  Mearl Latin, LCSW Phone Number: 02/05/2023, 1:59 PM   Clinical Narrative:    [name] Anticipated DC date: 02/05/23 Family notified: Pt notified son and dtr Transport by: Sharin Mons   Per MD patient ready for DC to [facility]. RN to call report prior to discharge [phone] room 159A). RN, patient, patient's family, and facility notified of DC. Discharge Summary and FL2 sent to facility. DC packet on chart. Ambulance transport requested for patient.   CSW will sign off for now as social work intervention is no longer needed. Please consult [place] again if new needs arise.     Final next level of care: Skilled Nursing Facility Barriers to Discharge: Barriers Resolved   Patient Goals and CMS Choice CMS Medicare.gov Compare Post Acute Care list provided to:: Patient Choice offered to / list presented to : Patient  Discharge Placement     Existing PASRR number confirmed : 02/05/23          Patient chooses bed at: Other - please specify in the comment section below: [name]) Patient to be transferred to facility by: [facility] Name of family member notified: Pt notified family Patient and family notified of of transfer: 02/05/23  Discharge Plan and Services Additional resources added to the After Visit Summary for   In-house Referral: Clinical Social Work   Post Acute Care Choice: Skilled Nursing Facility                               Social Determinants of Health (SDOH) Interventions SDOH Screenings   Food Insecurity: No Food Insecurity (02/01/2023)  Housing: Low Risk  (02/01/2023)  Transportation Needs: No Transportation Needs (02/01/2023)  Utilities: Not At Risk (02/01/2023)  Alcohol Screen: Low Risk  (12/05/2022)  Depression (PHQ2-9): Low Risk  (12/05/2022)   Financial Resource Strain: Low Risk  (12/05/2022)  Physical Activity: Inactive (12/05/2022)  Social Connections: Socially Isolated (12/05/2022)  Stress: No Stress Concern Present (12/05/2022)  Tobacco Use: Medium Risk (02/01/2023)     Readmission Risk Interventions    02/05/2023   11:08 AM  Readmission Risk Prevention Plan  Transportation Screening Complete  Medication Review (RN Care Manager) Complete  PCP or Specialist appointment within 3-5 days of discharge Complete  HRI or Home Care Consult Complete  SW Recovery Care/Counseling Consult Complete  Palliative Care Screening Not Applicable  Skilled Nursing Facility Complete

## 2023-02-05 NOTE — Care Management Important Message (Signed)
Important Message  Patient Details  Name: Jake Gross MRN: [id number] Date of Birth: 1941-10-09   Medicare Important Message Given:  Yes     Manpreet Kemmer Stefan Church 02/05/2023, 3:09 PM

## 2023-02-06 DIAGNOSIS — I4891 Unspecified atrial fibrillation: Secondary | ICD-10-CM | POA: Diagnosis not present

## 2023-02-06 DIAGNOSIS — Z7189 Other specified counseling: Secondary | ICD-10-CM | POA: Diagnosis not present

## 2023-02-06 DIAGNOSIS — R001 Bradycardia, unspecified: Secondary | ICD-10-CM | POA: Diagnosis not present

## 2023-02-06 DIAGNOSIS — I10 Essential (primary) hypertension: Secondary | ICD-10-CM | POA: Diagnosis not present

## 2023-02-06 DIAGNOSIS — E785 Hyperlipidemia, unspecified: Secondary | ICD-10-CM | POA: Diagnosis not present

## 2023-02-06 DIAGNOSIS — E1122 Type 2 diabetes mellitus with diabetic chronic kidney disease: Secondary | ICD-10-CM | POA: Diagnosis not present

## 2023-02-06 DIAGNOSIS — I5030 Unspecified diastolic (congestive) heart failure: Secondary | ICD-10-CM | POA: Diagnosis not present

## 2023-02-06 DIAGNOSIS — N184 Chronic kidney disease, stage 4 (severe): Secondary | ICD-10-CM | POA: Diagnosis not present

## 2023-02-09 ENCOUNTER — Ambulatory Visit: Payer: Medicare Other | Admitting: Nurse Practitioner

## 2023-02-12 ENCOUNTER — Encounter: Payer: Medicare Other | Admitting: Pharmacist

## 2023-02-13 DIAGNOSIS — D472 Monoclonal gammopathy: Secondary | ICD-10-CM | POA: Diagnosis not present

## 2023-02-13 DIAGNOSIS — E875 Hyperkalemia: Secondary | ICD-10-CM | POA: Diagnosis not present

## 2023-02-13 DIAGNOSIS — I129 Hypertensive chronic kidney disease with stage 1 through stage 4 chronic kidney disease, or unspecified chronic kidney disease: Secondary | ICD-10-CM | POA: Diagnosis not present

## 2023-02-13 DIAGNOSIS — N184 Chronic kidney disease, stage 4 (severe): Secondary | ICD-10-CM | POA: Diagnosis not present

## 2023-02-13 DIAGNOSIS — E1122 Type 2 diabetes mellitus with diabetic chronic kidney disease: Secondary | ICD-10-CM | POA: Diagnosis not present

## 2023-02-13 DIAGNOSIS — I5030 Unspecified diastolic (congestive) heart failure: Secondary | ICD-10-CM | POA: Diagnosis not present

## 2023-02-13 DIAGNOSIS — I4891 Unspecified atrial fibrillation: Secondary | ICD-10-CM | POA: Diagnosis not present

## 2023-02-14 DIAGNOSIS — E785 Hyperlipidemia, unspecified: Secondary | ICD-10-CM | POA: Diagnosis not present

## 2023-02-14 DIAGNOSIS — R296 Repeated falls: Secondary | ICD-10-CM | POA: Diagnosis not present

## 2023-02-14 DIAGNOSIS — N184 Chronic kidney disease, stage 4 (severe): Secondary | ICD-10-CM | POA: Diagnosis not present

## 2023-02-14 DIAGNOSIS — E1122 Type 2 diabetes mellitus with diabetic chronic kidney disease: Secondary | ICD-10-CM | POA: Diagnosis not present

## 2023-02-15 DIAGNOSIS — I4891 Unspecified atrial fibrillation: Secondary | ICD-10-CM | POA: Diagnosis not present

## 2023-02-15 DIAGNOSIS — E785 Hyperlipidemia, unspecified: Secondary | ICD-10-CM | POA: Diagnosis not present

## 2023-02-15 DIAGNOSIS — I509 Heart failure, unspecified: Secondary | ICD-10-CM | POA: Diagnosis not present

## 2023-02-15 DIAGNOSIS — R296 Repeated falls: Secondary | ICD-10-CM | POA: Diagnosis not present

## 2023-02-15 DIAGNOSIS — N184 Chronic kidney disease, stage 4 (severe): Secondary | ICD-10-CM | POA: Diagnosis not present

## 2023-02-15 DIAGNOSIS — T07XXXA Unspecified multiple injuries, initial encounter: Secondary | ICD-10-CM | POA: Diagnosis not present

## 2023-02-15 DIAGNOSIS — I10 Essential (primary) hypertension: Secondary | ICD-10-CM | POA: Diagnosis not present

## 2023-02-20 ENCOUNTER — Telehealth: Payer: Self-pay | Admitting: Nurse Practitioner

## 2023-02-20 NOTE — Telephone Encounter (Signed)
Pt daughter Wilkie Aye called in would like to reschedule appointment with you wants to know if it can be done on the same day of his office visit with [facility] please advise # [phone]

## 2023-02-20 NOTE — Telephone Encounter (Signed)
It doesn't look like I have availability close to the time of PCP appt that day. We can do a phone appt at another time if that is easier.

## 2023-02-21 DIAGNOSIS — I509 Heart failure, unspecified: Secondary | ICD-10-CM | POA: Diagnosis not present

## 2023-02-21 DIAGNOSIS — E785 Hyperlipidemia, unspecified: Secondary | ICD-10-CM | POA: Diagnosis not present

## 2023-02-21 DIAGNOSIS — I4891 Unspecified atrial fibrillation: Secondary | ICD-10-CM | POA: Diagnosis not present

## 2023-02-21 DIAGNOSIS — R339 Retention of urine, unspecified: Secondary | ICD-10-CM | POA: Diagnosis not present

## 2023-02-21 DIAGNOSIS — I10 Essential (primary) hypertension: Secondary | ICD-10-CM | POA: Diagnosis not present

## 2023-02-21 DIAGNOSIS — E1122 Type 2 diabetes mellitus with diabetic chronic kidney disease: Secondary | ICD-10-CM | POA: Diagnosis not present

## 2023-02-21 DIAGNOSIS — I5030 Unspecified diastolic (congestive) heart failure: Secondary | ICD-10-CM | POA: Diagnosis not present

## 2023-02-21 NOTE — Telephone Encounter (Cosign Needed)
Called patient's daughter; no answer; left message.  Al Corpus, PharmD notified  Claudina Lick, [place] Clinical Pharmacy Assistant [phone]

## 2023-02-22 ENCOUNTER — Telehealth: Payer: Self-pay

## 2023-02-22 DIAGNOSIS — N184 Chronic kidney disease, stage 4 (severe): Secondary | ICD-10-CM | POA: Diagnosis not present

## 2023-02-22 DIAGNOSIS — I10 Essential (primary) hypertension: Secondary | ICD-10-CM | POA: Diagnosis not present

## 2023-02-22 DIAGNOSIS — I509 Heart failure, unspecified: Secondary | ICD-10-CM | POA: Diagnosis not present

## 2023-02-22 DIAGNOSIS — I4891 Unspecified atrial fibrillation: Secondary | ICD-10-CM | POA: Diagnosis not present

## 2023-02-22 NOTE — Progress Notes (Signed)
Spoke with patient's daughter to reschedule his missed initial visit with Al Corpus, PharmD. Patient is now scheduled for a telephone appointment on 02/28/2023 at 2:00.  Al Corpus, PharmD notified  Claudina Lick, [place] Clinical Pharmacy Assistant [phone]

## 2023-02-23 ENCOUNTER — Ambulatory Visit: Payer: Self-pay

## 2023-02-23 NOTE — Patient Instructions (Signed)
Visit Information  Thank you for taking time to visit with me today. Please don't hesitate to contact me if I can be of assistance to you.   Following are the goals we discussed today:  Monitor blood pressure/ weight and record.  Keep follow up appointments with providers.  Notify provider for new or ongoing symptoms.  Take medications as prescribed.   Our next appointment is by telephone on 03/19/23 at 1:30 pm  Please call the care guide team at [phone] if you need to cancel or reschedule your appointment.   If you are experiencing a Mental Health or Behavioral Health Crisis or need someone to talk to, please call the Suicide and Crisis Lifeline: 988 call 1-800-273-TALK (toll free, 24 hour hotline)  Patient verbalizes understanding of instructions and care plan provided today and agrees to view in MyChart. Active MyChart status and patient understanding of how to access instructions and care plan via MyChart confirmed with patient.     [name] RN,BSN,CCM [facility] Care Coordination [phone] direct line

## 2023-02-23 NOTE — Patient Outreach (Signed)
  Care Coordination   Follow Up Visit Note   02/23/2023 Name: Jake Gross MRN: [id number] DOB: [birth year]  Jake Gross is a 83 y.o. year old male who sees Cable, Genene Churn, NP for primary care. I spoke with  [name] by phone today.  What matters to the patients health and wellness today?  Patient states he is doing great.  He reports his strength is back to 100%.  Patient reports being discharged from the SNF today.  He states he has all medications prescribed. He reports having hospital discharge follow up scheduled for 03/05/23.  Patient states he is ambulating well with walker. He states his son is going to take him grocery shopping this evening. He reports today's weight is 178 lbs. He states he is down in weight from 211 since becoming sick. Patient states he feels much better at this current weight.     Goals Addressed             This Visit's Progress    Patient stated: decrease blood pressure and post hospital/SNF discharge follow up       Interventions Today    Flowsheet Row Most Recent Value  Chronic Disease   Chronic disease during today's visit Chronic Kidney Disease/End Stage Renal Disease (ESRD), Hypertension (HTN), Diabetes  General Interventions   General Interventions Discussed/Reviewed Doctor Visits  [evaluation of current treatment plan for DM, HTN, CKD and patients adherence to plan as established by provider. Assessed for new or ongoing symptoms. Assessed blood sugars and weight.]  Doctor Visits Discussed/Reviewed Doctor Visits Reviewed  [facility] patient has post hospital follow up visit scheduled with PCP. Confirmed patient has transportation to provider visits.]  Education Interventions   Education Provided Provided Education  [Confirmed patient has scales to weigh. Advised patient to monitor blood pressure.]  Nutrition Interventions   Nutrition Discussed/Reviewed Nutrition Reviewed  [Assessed patients appetite. Confirmed patient able to obtain  food.]  Pharmacy Interventions   Pharmacy Dicussed/Reviewed Pharmacy Topics Reviewed  [medication reviewed and compliance discussed. Confirmed patient has recommended medications post hospital /SNF Discharged.]                  SDOH assessments and interventions completed:  No     Care Coordination Interventions:  Yes, provided   Follow up plan: Follow up call scheduled for 03/19/23    Encounter Outcome:  Pt. Visit Completed   [name] RN,BSN,CCM [facility] Care Coordination [phone] direct line

## 2023-02-23 NOTE — Telephone Encounter (Signed)
Appt rescheduled as phone visit for 5/15.

## 2023-02-26 ENCOUNTER — Telehealth: Payer: Self-pay | Admitting: Nurse Practitioner

## 2023-02-26 ENCOUNTER — Telehealth: Payer: Self-pay

## 2023-02-26 DIAGNOSIS — E1122 Type 2 diabetes mellitus with diabetic chronic kidney disease: Secondary | ICD-10-CM | POA: Diagnosis not present

## 2023-02-26 DIAGNOSIS — Z8673 Personal history of transient ischemic attack (TIA), and cerebral infarction without residual deficits: Secondary | ICD-10-CM | POA: Diagnosis not present

## 2023-02-26 DIAGNOSIS — Z7901 Long term (current) use of anticoagulants: Secondary | ICD-10-CM | POA: Diagnosis not present

## 2023-02-26 DIAGNOSIS — Z87891 Personal history of nicotine dependence: Secondary | ICD-10-CM | POA: Diagnosis not present

## 2023-02-26 DIAGNOSIS — I130 Hypertensive heart and chronic kidney disease with heart failure and stage 1 through stage 4 chronic kidney disease, or unspecified chronic kidney disease: Secondary | ICD-10-CM | POA: Diagnosis not present

## 2023-02-26 DIAGNOSIS — I5032 Chronic diastolic (congestive) heart failure: Secondary | ICD-10-CM | POA: Diagnosis not present

## 2023-02-26 DIAGNOSIS — Z9181 History of falling: Secondary | ICD-10-CM | POA: Diagnosis not present

## 2023-02-26 DIAGNOSIS — I480 Paroxysmal atrial fibrillation: Secondary | ICD-10-CM | POA: Diagnosis not present

## 2023-02-26 DIAGNOSIS — Z7984 Long term (current) use of oral hypoglycemic drugs: Secondary | ICD-10-CM | POA: Diagnosis not present

## 2023-02-26 DIAGNOSIS — N184 Chronic kidney disease, stage 4 (severe): Secondary | ICD-10-CM | POA: Diagnosis not present

## 2023-02-26 DIAGNOSIS — D509 Iron deficiency anemia, unspecified: Secondary | ICD-10-CM | POA: Diagnosis not present

## 2023-02-26 DIAGNOSIS — K7460 Unspecified cirrhosis of liver: Secondary | ICD-10-CM | POA: Diagnosis not present

## 2023-02-26 DIAGNOSIS — I2510 Atherosclerotic heart disease of native coronary artery without angina pectoris: Secondary | ICD-10-CM | POA: Diagnosis not present

## 2023-02-26 DIAGNOSIS — E785 Hyperlipidemia, unspecified: Secondary | ICD-10-CM | POA: Diagnosis not present

## 2023-02-26 NOTE — Telephone Encounter (Signed)
Verbal orders ok

## 2023-02-26 NOTE — Progress Notes (Signed)
Care Management & Coordination Services Pharmacy Team  Reason for Encounter: Initial Appointment Reminder  Contacted patient to confirm telephone appointment with Al Corpus, PharmD on 02/28/2023 at 2:00.  Spoke with patient on 02/26/2023   Do you have any problems getting your medications? No  What is your top health concern you would like to discuss at your upcoming visit? No concerns  Have you seen any other providers since your last visit with PCP? Yes  Chart review:  Recent office visits:  12/05/22 [facility] 11/09/22 Mordecai Maes, NP DM Change: Insulin Glargine 55 units vs 60 units Change: Metoprolol 37.5 mg F/U 3 months  Recent consult visits:  11/20/22 Verne Carrow, MD (Cardiology) Coronary Artery Disease No med changes F/U 6 months  Hospital visits:  Admitted to the hospital on 02/01/2023 due to Acute-on-chronic kidney injury. Discharge date was 02/05/2023. Discharged from [facility].    New?Medications Started at [facility] Discharge:?? Acetaminophen 325 MG tablet Insulin aspart 100 UNIT/ML injection - 0-15 Units subcutaneous 3 times daily with meal Insulin aspart 100 UNIT/ML injection - 0-5 Units subcutaneous daily at bedtime   Medication Changes at Hospital Discharge: Torsemide 20 MG tablet   Medications Discontinued at Hospital Discharge: Chlorthalidone 25 MG tablet Farxiga 10 MG Tabs tablet  Metoprolol tartrate 25 MG tablet   Medications that remain the same after Hospital Discharge:??  -All other medications will remain the same.    Admitted to the hospital on 01/24/2023 due to Acute renal failure 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 . Discharge date was 01/26/2023. Discharged from [facility].    Medication Changes at Hospital Discharge: metoprolol tartrate 25 MG tablet   -All other medications will remain the same.    Medication Reconciliation was completed by comparing discharge summary, patient's EMR and Pharmacy  list, and upon discussion with patient.  Admitted to the hospital on 09/27/22 due to Acute renal failure 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. Discharge date was 10/03/2022. Discharged from [facility].    Medications Discontinued at [facility] Discharge: Torsemide 20 MG tablet  Medications that remain the same after Hospital Discharge:??  -All other medications will remain the same.    Medication Reconciliation was completed by comparing discharge summary, patient's EMR and Pharmacy list, and upon discussion with patient.  Star Rating Drugs:  Medication:  Last Fill: Day Supply Rosuvastatin 20 mg 01/10/2023 90  Care Gaps: Annual wellness visit in last year? Yes 12/05/2022  If Diabetic: Last eye exam / retinopathy screening: Overdue Last diabetic foot exam: Up to date  Al Corpus, PharmD notified  Claudina Lick, [place] Clinical Pharmacy Assistant [phone]

## 2023-02-26 NOTE — Telephone Encounter (Signed)
Home Health verbal orders Caller Name:[name] Agency Name: [facility]  Callback number: [phone]  Requesting OT/PT/Skilled nursing/Social Work/Speech: nursing  Reason:wound care,teaching on diabetes ,chd ,chronic kidney disease  Frequency:2x 1 wk 1 x wk 8 wk  Please forward to [facility] pool or providers CMA

## 2023-02-26 NOTE — Telephone Encounter (Signed)
Verbal order given  

## 2023-02-27 ENCOUNTER — Telehealth: Payer: Self-pay | Admitting: Nurse Practitioner

## 2023-02-27 DIAGNOSIS — E1122 Type 2 diabetes mellitus with diabetic chronic kidney disease: Secondary | ICD-10-CM | POA: Diagnosis not present

## 2023-02-27 DIAGNOSIS — I130 Hypertensive heart and chronic kidney disease with heart failure and stage 1 through stage 4 chronic kidney disease, or unspecified chronic kidney disease: Secondary | ICD-10-CM | POA: Diagnosis not present

## 2023-02-27 DIAGNOSIS — I5032 Chronic diastolic (congestive) heart failure: Secondary | ICD-10-CM | POA: Diagnosis not present

## 2023-02-27 DIAGNOSIS — I480 Paroxysmal atrial fibrillation: Secondary | ICD-10-CM | POA: Diagnosis not present

## 2023-02-27 DIAGNOSIS — N184 Chronic kidney disease, stage 4 (severe): Secondary | ICD-10-CM | POA: Diagnosis not present

## 2023-02-27 DIAGNOSIS — K7460 Unspecified cirrhosis of liver: Secondary | ICD-10-CM | POA: Diagnosis not present

## 2023-02-27 NOTE — Telephone Encounter (Signed)
Verbals orders given.  

## 2023-02-27 NOTE — Telephone Encounter (Signed)
Verbal orders ok

## 2023-02-27 NOTE — Telephone Encounter (Signed)
Home Health verbal orders Caller Name: Judie Grieve, Physical Therapist  Agency Name: [facility]   Callback number: [phone]  Requesting OT/PT/Skilled nursing/Social Work/Speech: Physical Therapy   Reason: Strengthening and balance training   Frequency: 1wk 8  Please forward to [facility] pool or providers CMA

## 2023-02-27 NOTE — Telephone Encounter (Signed)
Left message to return call to our office.  

## 2023-02-28 ENCOUNTER — Ambulatory Visit: Payer: Medicare Other | Admitting: Pharmacist

## 2023-02-28 NOTE — Progress Notes (Signed)
Care Management & Coordination Services Pharmacy Note  02/28/2023 Name:  Jake Gross MRN:  [id number] DOB:  1941-04-05  Summary: Initial Televisit -Reviewed recent hospitalizations for AKI/dehydration in s/o N/V; per discharge instructions pt was to discontinue Farxiga, chlorthalidone, and metoprolol and reduce torsemide to 20 mg once daily, unfortunately he did not make these changes and resumed medications from PTA; pt/family got confused as they only had AVS from first hospitalization in April (when no changes were made) and were going off of that -Pt has HH nurse coming out Mon/Friday; family reports vitals were stable on Mon 5/13; pt is feeling better; he reports weight was 180# today (new baseline)  Recommendations/Changes made from today's visit: -Advised to hold Farxiga, chlorthalidone and metoprolol; advised to reduce torsemide to 20 mg once daily (per hospital d/c instructions); keep PCP appt 5/20 -Advised to monitor BP, HR and weight daily; contact provider if wt increased 3+ lbs in 24 hours  Follow up plan: -CC RN f/u 1-2 weeks -Pharmacist follow up televisit scheduled 03/26/23 -PCP 03/05/23    Subjective: Jake Gross is an 82 y.o. year old male who is a primary patient of Eden Emms, NP.  The care coordination team was consulted for assistance with disease management and care coordination needs.    Engaged with patient by telephone for initial visit. Patient lives at home alone. He has son and daughter who live nearby and check in on him often. His son fills up his pillbox each week.   Recent office visits: 11/09/22 NP Matt Cable OV: f/u - A1c 6.4%; reduce Semglee to 55 units. Advised to schedule cardiology appt. F/u 3 months.  Recent consult visits: 02/20/23 Dr Valentino Nose (Nephrology): hosp f/u - Cr returned to baseline. F/u 1 month. Consider retrial of ARB/SGLT2 in future when more stable.  11/20/22 Dr Clifton James (Cardiology): stable, no changes.  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  Hospital visits: 02/01/23 - 02/05/23 Admission [facility]): dehydration, AKI in s/o N/V. Given IVF. Discontinue Farxiga, HCTZ. D/C metoprolol d/t bradycardia. Reduce torsemide to 20 mg once daily. Resume imdur, hydralazine, amlodipine at home dose. D/C to SNF.  01/24/23 - 01/26/23 Admission [facility]): acute renal failure, frequent falls. Given IV fluids, resume home diuretics at d/c.  09/27/22 - 10/03/22 Admission [facility]): acute renal failure - avoid hypotension and nephrotoxic agents. Hold diuretics.  Objective:  Lab Results  Component Value Date   CREATININE 2.26 (H) 02/05/2023   BUN 59 (H) 02/05/2023   GFR 23.66 (L) 11/09/2022   EGFR 28 (L) 07/20/2021   GFRNONAA 29 (L) 02/05/2023   GFRAA 32 (L) 10/27/2019   NA 141 02/05/2023   K 4.3 02/05/2023   CALCIUM 8.6 (L) 02/05/2023   CO2 20 (L) 02/05/2023   GLUCOSE 110 (H) 02/05/2023    Lab Results  Component Value Date/Time   HGBA1C 6.4 (A) 11/09/2022 04:12 PM   HGBA1C 6.9 (H) 04/27/2022 02:37 PM   HGBA1C 7.2 (A) 11/01/2021 01:54 PM   HGBA1C 7.1 (H) 11/26/2018 04:26 AM   FRUCTOSAMINE 197 (L) 07/10/2019 01:24 PM   GFR 23.66 (L) 11/09/2022 04:33 PM   GFR 21.76 (L) 04/27/2022 02:37 PM    Last diabetic Eye exam: No results found for: "HMDIABEYEEXA"  Last diabetic Foot exam: No results found for: "HMDIABFOOTEX"   Lab Results  Component Value Date   CHOL 187 04/27/2022   HDL 23.70 (L) 04/27/2022   LDLCALC 98 10/28/2021   LDLDIRECT 98.0 04/27/2022   TRIG (H) 04/27/2022    486.0  Triglyceride is over 400; calculations on Lipids are invalid.   CHOLHDL 8 04/27/2022       Latest Ref Rng & Units 02/03/2023    3:30 AM 02/02/2023    1:32 AM 02/01/2023    5:20 PM  Hepatic Function  Total Protein 6.5 - 8.1 g/dL 6.1  6.7  7.6   Albumin 3.5 - 5.0 g/dL 2.6  2.9  3.3   AST 15 - 41 U/L 17  18  26    ALT 0 - 44 U/L 11  12  13    Alk Phosphatase 38 - 126 U/L 74  83  93   Total Bilirubin 0.3 - 1.2 mg/dL 0.7  1.4  1.5      Lab Results  Component Value Date/Time   TSH 0.731 02/02/2023 01:32 AM   TSH 1.445 08/27/2019 06:49 PM       Latest Ref Rng & Units 02/04/2023    3:41 AM 02/03/2023    3:30 AM 02/02/2023    1:32 AM  CBC  WBC 4.0 - 10.5 K/uL 9.2  10.7  10.4   Hemoglobin 13.0 - 17.0 g/dL 78.4  69.6  29.5   Hematocrit 39.0 - 52.0 % 34.9  36.2  39.5   Platelets 150 - 400 K/uL 193  221  257     Lab Results  Component Value Date/Time   VITAMINB12 211 02/03/2023 03:30 AM   VITAMINB12 231 06/27/2021 12:52 PM    Clinical ASCVD: Yes  The ASCVD Risk score (Arnett DK, et al., 2019) failed to calculate for the following reasons:   The 2019 ASCVD risk score is only valid for ages 72 to 76   The patient has a prior MI or stroke diagnosis    CHA2DS2/VAS Stroke Risk Points  Current as of about an hour ago     8 >= 2 Points: High Risk       12/05/2022    2:26 PM 10/28/2021    2:04 PM 09/22/2020    3:39 PM  Depression screen PHQ 2/9  Decreased Interest 0 0 0  Down, Depressed, Hopeless 0 0 0  PHQ - 2 Score 0 0 0     Social History   Tobacco Use  Smoking Status Former   Types: Cigarettes   Quit date: 08/13/1994   Years since quitting: 28.5  Smokeless Tobacco Never   BP Readings from Last 3 Encounters:  02/05/23 (!) 157/73  01/26/23 (!) 140/68  11/20/22 128/70   Pulse Readings from Last 3 Encounters:  02/05/23 65  01/26/23 66  11/20/22 (!) 57   Wt Readings from Last 3 Encounters:  02/01/23 180 lb (81.6 kg)  01/24/23 190 lb (86.2 kg)  12/05/22 184 lb (83.5 kg)   BMI Readings from Last 3 Encounters:  02/01/23 28.19 kg/m  01/24/23 29.76 kg/m  12/05/22 28.82 kg/m    No Known Allergies  Medications Reviewed Today     Reviewed by Kathyrn Sheriff, [facility] (Pharmacist) on 02/28/23 at 1432  Med List Status: <None>   Medication Order Taking? Sig Documenting Provider Last Dose Status Informant  acetaminophen (TYLENOL) 325 MG tablet [id number] Yes Take 2 tablets (650 mg total) by  mouth every 6 (six) hours as needed for mild pain (or Fever >/= 101). Elgergawy, Leana Roe, MD Taking Active   amLODipine (NORVASC) 5 MG tablet [id number] Yes TAKE 1 TABLET (5 MG TOTAL) BY MOUTH DAILY. PLEASE CALL TO SCHEDULE AN OVERDUE APPOINTMENT Kathleene Hazel, MD Taking Active Child, Pharmacy Records  apixaban (ELIQUIS) 2.5 MG TABS tablet [id number] Yes Take 1 tablet (2.5 mg total) by mouth 2 (two) times daily. Kathleene Hazel, MD Taking Active Child, Pharmacy Records  hydrALAZINE (APRESOLINE) 100 MG tablet [id number] Yes Take 1 tablet (100 mg total) by mouth 3 (three) times daily. Please call to schedule an overdue appointment with Dr. Clifton James for refills, [phone], thank you. 1st attempt. Kathleene Hazel, MD Taking Active Pharmacy Records, Child           Med Note Jola Schmidt   Fri Feb 02, 2023  7:27 AM) Dispense records do not support.  insulin glargine-yfgn (SEMGLEE) 100 UNIT/ML Pen [id number] Yes Inject 55 Units into the skin daily. Eden Emms, NP Taking Active Child, Pharmacy Records           Med Note Maida Sale Feb 28, 2023  2:19 PM)    Insulin Pen Needle (BD PEN NEEDLE NANO 2ND GEN) 32G X 4 MM MISC [id number] Yes Use with insulin pen as directed Eden Emms, NP Taking Active Pharmacy Records, Child  isosorbide mononitrate (IMDUR) 60 MG 24 hr tablet [id number] Yes TAKE 1 TABLET BY MOUTH EVERY DAY (NEED OFFICE VISIT) Kathleene Hazel, MD Taking Active Child, Pharmacy Records  Lancets [facility] ULTRASOFT) lancets [id number] Yes Use as instructed Romero Belling, MD Taking Active Pharmacy Records, Child  rosuvastatin (CRESTOR) 20 MG tablet [id number] Yes TAKE 1 TABLET BY MOUTH AT BEDTIME. SCHEDULE PHYSICAL EXAM Eden Emms, NP Taking Active Child, Pharmacy Records  tamsulosin [facility]) 0.4 MG CAPS capsule [id number] No TAKE 1 CAPSULE BY MOUTH EVERY DAY  Patient not taking: Reported on 02/28/2023   Lorre Munroe, NP Not Taking Active  Pharmacy Records, Child           Med Note Maida Sale Feb 28, 2023  2:23 PM) Rx ran out ~Feb 2024, overdue for urology f/u  torsemide (DEMADEX) 20 MG tablet [id number] Yes Take 1 tablet (20 mg total) by mouth daily. Elgergawy, Leana Roe, MD Taking Active             SDOH:  (Social Determinants of Health) assessments and interventions performed: No SDOH Interventions    Flowsheet Row Telephone from 01/30/2023 in [facility] Coordination Clinical Support from 12/05/2022 in [facility] Coordination from 06/20/2022 in [facility] Coordination Patient Outreach Telephone from 11/14/2021 in [facility] Coord[facility] Patient Outreach Telephone from 09/22/2020 in [facility] Coordination  SDOH Interventions       Food Insecurity Interventions Intervention Not Indicated Intervention Not Indicated Intervention Not Indicated Intervention Not Indicated Intervention Not Indicated  Housing Interventions Intervention Not Indicated Intervention Not Indicated -- Intervention Not Indicated Intervention Not Indicated  Transportation Interventions Intervention Not Indicated Intervention Not Indicated Intervention Not Indicated Intervention Not Indicated Intervention Not Indicated  Utilities Interventions -- Intervention Not Indicated -- -- --  Alcohol Usage Interventions -- Intervention Not Indicated (Score <7) -- -- --  Financial Strain Interventions -- Intervention Not Indicated -- -- --  Physical Activity Interventions -- Intervention Not Indicated -- -- --  Stress Interventions -- Intervention Not Indicated -- -- --  Social Connections Interventions -- Intervention Not Indicated -- -- --       Medication Assistance: None required.  Patient affirms current coverage meets needs.  Medication Access: Within the past 30 days, how often has patient  missed a  dose of medication? 0 Is a pillbox or other method used to improve adherence? Yes  Factors that may affect medication adherence? lack of understanding of disease management and multiple med lists Are meds synced by current pharmacy? No  Are meds delivered by current pharmacy? No  Does patient experience delays in picking up medications due to transportation concerns? No   [facility] Services Reviewed: Is patient disadvantaged to use [facility]?: Yes  Current Rx insurance plan: Express scripts Name and location of Current pharmacy:  [id number] [place], [place] - [address] [place] [address] Phone: [phone] Fax: [phone]  [facility] services reviewed with patient today?: No  Patient requests to transfer care to [facility]?: No  Reason patient declined to change pharmacies: Disadvantaged due to insurance/mail order  Compliance/Adherence/Medication fill history: Care Gaps: UACR  Eye exam (due 01/2018)  Star-Rating Drugs: Rosuvastatin - PDC 76%; LF 01/10/23 x 90 ds; 11/09/22 x 90 ds   Assessment/Plan  Heart Failure / Hypertension (Goal: BP < 150/90) -Stable - pt reports weight/swelling has been stable since being home; he is taking farxiga, metoprolol still due to mistakenly using old med list; discussed most recent hospital d/c instructions to hold these and reduce torsemide to 20 mg once daily -Multiple hospitalizations for dehydration/AKI; hypotension is more dangerous than high BP at this time -Current home BP/HR readings: nurse 2x per week -Current home daily weights: 180# -Last ejection fraction: 60-65% (Date: 01/2023) -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%) -NYHA Class: II -AHA HF Stage: C (Heart disease and symptoms present) -Current treatment: Amlodipine 5 mg daily - Appropriate, Effective, Safe, Accessible Hydralazine 100 mg TID - Appropriate, Effective, Safe, Accessible Isosorbide  MN 60 mg daily -Appropriate, Effective, Safe, Accessible Torsemide 20 mg daily (taking BID still) -Query appropriate Farxiga 10 mg (still taking) - Query appropriate Metoprolol (still taking) -Query appropriate -Medications previously tried: metoprolol, Farxiga, chlorthalidone -Educated on Benefits of medications for managing symptoms and prolonging life Importance of weighing daily; if you gain more than 3 pounds in one day or 5 pounds in one week, contact provider -Counseled to continue monitoring daily weight and BP/HR -Advised to hold Farxiga, chlorthalidone and metoprolol; advised to reduce torsemide to 20 mg once daily (per hospital d/c instructions); keep PCP appt 5/20; will ask CC RN to f/u in 1-2 weeks for monitoring update  Atrial Fibrillation (Goal: prevent stroke and major bleeding) -Controlled - pt reports compliance with Eliquis and denies concerns; he did recently stop metoprolol d/t bradycardia, HH nurse did not report any concerns with vitals thi sweek -CHADSVASC: 8 -Current treatment: Eliquis 2.5 mg BID - Appropriate, Effective, Safe, Accessible -Medications previously tried: metoprolol (bradycardia) -Counseled on increased risk of stroke due to Afib and benefits of anticoagulation for stroke prevention; importance of adherence to anticoagulant exactly as prescribed; bleeding risk associated with Eliquis and importance of self-monitoring for signs/symptoms of bleeding; -Recommended to continue current medication  Hyperlipidemia: (LDL goal < 70) -Not ideally controlled - LDL 98 (04/2022) above goal, though according to fill dates for statin pt was likely not adherent to statin at time of labwork; he endorses adherence in 2024 -Hx CAD (CABG), hx stroke; no aspirin d/t Eliquis, high fall risk -Current treatment: Rosuvastatin 20 mg daily - Appropriate, Query Effective Isosorbide MN 60 mg daily - Appropriate, Effective, Safe, Accessible -Medications previously tried: n/a   -Educated on Cholesterol goals; Benefits of statin for ASCVD risk reduction; -Recommended to continue current  medication; recheck lipid panel at next OV  Diabetes (A1c goal <7%) -Controlled - A1c 6.4% (10/2022) at goal -Current home glucose readings fasting glucose: 101 -Denies hypoglycemic/hyperglycemic symptoms -Current medications: Semglee 55 units daily - Appropriate, Effective, Safe, Accessible -Medications previously tried: [place] (dehydration)  -Educated on A1c and blood sugar goals; Prevention and management of hypoglycemic episodes; -Recommended to continue current medication; recheck A1c at next OV  Frequent falls (Goal: prevent falls) -Pt has history of falls in s/o dehydration, dizziness; he has home health right now and working on balance; he feels 100% better now compared to hospitalizations in April 2024  Chronic Kidney Disease Stage 4  -All medications assessed for renal dosing and appropriateness in chronic kidney disease. -Follows with nephrology (Dr Valentino Nose) -Pt hospitalized twice in April 2024 for AKI on CKD related to dehydration, hypotension; recently [place] on hold, per nephrology may consider restarting Marcelline Deist as pt stabilizes -Recommended to continue current medication  Health Maintenance -Vaccine gaps: Shingrix -BPH: tamsulosin 0.4 mg - not taking, Rx ran out as he is overdue for urology follow up   Al Corpus, PharmD, [place], CPP Clinical Pharmacist Practitioner [facility] [phone]

## 2023-03-02 DIAGNOSIS — E1122 Type 2 diabetes mellitus with diabetic chronic kidney disease: Secondary | ICD-10-CM | POA: Diagnosis not present

## 2023-03-02 DIAGNOSIS — N184 Chronic kidney disease, stage 4 (severe): Secondary | ICD-10-CM | POA: Diagnosis not present

## 2023-03-02 DIAGNOSIS — I130 Hypertensive heart and chronic kidney disease with heart failure and stage 1 through stage 4 chronic kidney disease, or unspecified chronic kidney disease: Secondary | ICD-10-CM | POA: Diagnosis not present

## 2023-03-02 DIAGNOSIS — I480 Paroxysmal atrial fibrillation: Secondary | ICD-10-CM | POA: Diagnosis not present

## 2023-03-02 DIAGNOSIS — I5032 Chronic diastolic (congestive) heart failure: Secondary | ICD-10-CM | POA: Diagnosis not present

## 2023-03-02 DIAGNOSIS — K7460 Unspecified cirrhosis of liver: Secondary | ICD-10-CM | POA: Diagnosis not present

## 2023-03-05 ENCOUNTER — Encounter: Payer: Self-pay | Admitting: Nurse Practitioner

## 2023-03-05 ENCOUNTER — Ambulatory Visit: Payer: Medicare Other | Admitting: Nurse Practitioner

## 2023-03-05 VITALS — BP 138/60 | HR 77 | Temp 97.8°F | Ht 67.0 in | Wt 184.2 lb

## 2023-03-05 DIAGNOSIS — I10 Essential (primary) hypertension: Secondary | ICD-10-CM | POA: Diagnosis not present

## 2023-03-05 DIAGNOSIS — Z794 Long term (current) use of insulin: Secondary | ICD-10-CM

## 2023-03-05 DIAGNOSIS — N184 Chronic kidney disease, stage 4 (severe): Secondary | ICD-10-CM | POA: Diagnosis not present

## 2023-03-05 DIAGNOSIS — E119 Type 2 diabetes mellitus without complications: Secondary | ICD-10-CM | POA: Diagnosis not present

## 2023-03-05 DIAGNOSIS — I130 Hypertensive heart and chronic kidney disease with heart failure and stage 1 through stage 4 chronic kidney disease, or unspecified chronic kidney disease: Secondary | ICD-10-CM | POA: Diagnosis not present

## 2023-03-05 DIAGNOSIS — I480 Paroxysmal atrial fibrillation: Secondary | ICD-10-CM | POA: Diagnosis not present

## 2023-03-05 DIAGNOSIS — K7460 Unspecified cirrhosis of liver: Secondary | ICD-10-CM | POA: Diagnosis not present

## 2023-03-05 DIAGNOSIS — Z09 Encounter for follow-up examination after completed treatment for conditions other than malignant neoplasm: Secondary | ICD-10-CM

## 2023-03-05 DIAGNOSIS — I5032 Chronic diastolic (congestive) heart failure: Secondary | ICD-10-CM | POA: Diagnosis not present

## 2023-03-05 DIAGNOSIS — E1122 Type 2 diabetes mellitus with diabetic chronic kidney disease: Secondary | ICD-10-CM | POA: Diagnosis not present

## 2023-03-05 LAB — POCT GLYCOSYLATED HEMOGLOBIN (HGB A1C): Hemoglobin A1C: 6 % — AB (ref 4.0–5.6)

## 2023-03-05 NOTE — Assessment & Plan Note (Signed)
Patient currently doing 55 units of long-acting insulin once daily.  No hypoglycemia per patient report A1c 6.0.  Gave parameters in regards that if he has glucose is 80 or under he wakes up to decrease the insulin by 2 to 3 units and let office know.

## 2023-03-05 NOTE — Assessment & Plan Note (Signed)
Patient's metoprolol and HCTZ were discontinued due to bradycardia AKI and fall.  Blood pressure decently controlled in office patient will continue using isosorbide, amlodipine, hydralazine, torsemide.  Continue follow-up with cardiology as recommended

## 2023-03-05 NOTE — Patient Instructions (Signed)
Nice to see you today If you get sugars in the mornings that is lower than 80 cut back on the insulin 2-3 units and let the office know Follow up with me in 3 months, sooner if you need me

## 2023-03-05 NOTE — Progress Notes (Signed)
Established Patient Office Visit  Subjective   Patient ID: Leor Dinse Stratton, male    DOB: 03/29/1941  Age: 82 y.o. MRN: [id number]  Chief Complaint  Patient presents with   Diabetes      Hosptial follow up: Patient was admitted to the hospital on 02/03/2023.  Patient had metoprolol discontinued due to bradycardia with pauses.  They discontinue HCTZ also.  Patient was discharged to a skilled nursing facility States that he has PT tha comes 2 different ones once week. SN twice a week. He states that he has been home from the SNF for approx 2 weeks.    DM2: states that he does 55 units a day. States that iwa was in the 90s this am. States that he deos check ti everyday. 101 yesterday No low glucose 80 or below  Falls: no falls since last hospital visit  States it was checked today by the home health nurse it was 140/60. States no more falls. No dizziness or lightheadedness. He does use a cane when outside of the house     Review of Systems  Constitutional:  Negative for chills and fever.  Respiratory:  Negative for shortness of breath.   Cardiovascular:  Negative for chest pain.  Neurological:  Negative for dizziness and headaches.      Objective:     BP 138/60   Pulse 77   Temp 97.8 F (36.6 C)   Ht 5\' 7"  (1.702 m)   Wt 184 lb 4 oz (83.6 kg)   SpO2 96%   BMI 28.86 kg/m  BP Readings from Last 3 Encounters:  03/05/23 138/60  02/05/23 (!) 157/73  01/26/23 (!) 140/68   Wt Readings from Last 3 Encounters:  03/05/23 184 lb 4 oz (83.6 kg)  02/01/23 180 lb (81.6 kg)  01/24/23 190 lb (86.2 kg)      Physical Exam Vitals and nursing note reviewed.  Constitutional:      Appearance: Normal appearance.  Cardiovascular:     Rate and Rhythm: Normal rate and regular rhythm.     Pulses:          Posterior tibial pulses are 2+ on the right side and 1+ on the left side.     Heart sounds: Normal heart sounds.  Pulmonary:     Effort: Pulmonary effort is normal.     Breath  sounds: Normal breath sounds.  Musculoskeletal:     Right lower leg: No edema.     Left lower leg: No edema.  Feet:     Right foot:     Skin integrity: Skin integrity normal.     Left foot:     Skin integrity: Skin integrity normal.  Neurological:     Mental Status: He is alert.      Results for orders placed or performed in visit on 03/05/23  POCT glycosylated hemoglobin (Hb A1C)  Result Value Ref Range   Hemoglobin A1C 6.0 (A) 4.0 - 5.6 %   HbA1c POC (<> result, manual entry)     HbA1c, POC (prediabetic range)     HbA1c, POC (controlled diabetic range)        The ASCVD Risk score (Arnett DK, et al., 2019) failed to calculate for the following reasons:   The 2019 ASCVD risk score is only valid for ages 34 to 88   The patient has a prior MI or stroke diagnosis    Assessment & Plan:   Problem List Items Addressed This Visit  Cardiovascular and Mediastinum   Essential hypertension    Patient's metoprolol and HCTZ were discontinued due to bradycardia AKI and fall.  Blood pressure decently controlled in office patient will continue using isosorbide, amlodipine, hydralazine, torsemide.  Continue follow-up with cardiology as recommended      Relevant Orders   CBC   Comprehensive metabolic panel     Endocrine   Diabetes mellitus type 2, insulin dependent (HCC) - Primary    Patient currently doing 55 units of long-acting insulin once daily.  No hypoglycemia per patient report A1c 6.0.  Gave parameters in regards that if he has glucose is 80 or under he wakes up to decrease the insulin by 2 to 3 units and let office know.      Relevant Orders   POCT glycosylated hemoglobin (Hb A1C) (Completed)   CBC   Comprehensive metabolic panel     Genitourinary   CKD (chronic kidney disease)    Patient is followed by nephrology.  Recently had a hospital admission where he had an acute kidney injury on top of chronic kidney disease.  Pending CMP today      Relevant Orders    CBC   Comprehensive metabolic panel     Other   Hospital discharge follow-up    Reviewed hospital note and labs        Return in about 3 months (around 06/05/2023) for BP recheck, DM recheck.    Audria Nine, NP

## 2023-03-05 NOTE — Assessment & Plan Note (Signed)
Reviewed hospital note and labs

## 2023-03-05 NOTE — Assessment & Plan Note (Signed)
Patient is followed by nephrology.  Recently had a hospital admission where he had an acute kidney injury on top of chronic kidney disease.  Pending CMP today

## 2023-03-06 DIAGNOSIS — I5032 Chronic diastolic (congestive) heart failure: Secondary | ICD-10-CM | POA: Diagnosis not present

## 2023-03-06 DIAGNOSIS — N184 Chronic kidney disease, stage 4 (severe): Secondary | ICD-10-CM | POA: Diagnosis not present

## 2023-03-06 DIAGNOSIS — I130 Hypertensive heart and chronic kidney disease with heart failure and stage 1 through stage 4 chronic kidney disease, or unspecified chronic kidney disease: Secondary | ICD-10-CM | POA: Diagnosis not present

## 2023-03-06 DIAGNOSIS — E1122 Type 2 diabetes mellitus with diabetic chronic kidney disease: Secondary | ICD-10-CM | POA: Diagnosis not present

## 2023-03-06 DIAGNOSIS — K7460 Unspecified cirrhosis of liver: Secondary | ICD-10-CM | POA: Diagnosis not present

## 2023-03-06 DIAGNOSIS — I480 Paroxysmal atrial fibrillation: Secondary | ICD-10-CM | POA: Diagnosis not present

## 2023-03-06 LAB — CBC
HCT: 35.7 % — ABNORMAL LOW (ref 39.0–52.0)
Hemoglobin: 11.6 g/dL — ABNORMAL LOW (ref 13.0–17.0)
MCHC: 32.4 g/dL (ref 30.0–36.0)
MCV: 82.4 fl (ref 78.0–100.0)
Platelets: 249 10*3/uL (ref 150.0–400.0)
RBC: 4.33 Mil/uL (ref 4.22–5.81)
RDW: 16.6 % — ABNORMAL HIGH (ref 11.5–15.5)
WBC: 10.4 10*3/uL (ref 4.0–10.5)

## 2023-03-06 LAB — COMPREHENSIVE METABOLIC PANEL
ALT: 8 U/L (ref 0–53)
AST: 18 U/L (ref 0–37)
Albumin: 3.5 g/dL (ref 3.5–5.2)
Alkaline Phosphatase: 76 U/L (ref 39–117)
BUN: 35 mg/dL — ABNORMAL HIGH (ref 6–23)
CO2: 22 mEq/L (ref 19–32)
Calcium: 8.9 mg/dL (ref 8.4–10.5)
Chloride: 106 mEq/L (ref 96–112)
Creatinine, Ser: 1.76 mg/dL — ABNORMAL HIGH (ref 0.40–1.50)
GFR: 36.15 mL/min — ABNORMAL LOW (ref >60.00)
Glucose, Bld: 129 mg/dL — ABNORMAL HIGH (ref 70–99)
Potassium: 4.2 mEq/L (ref 3.5–5.1)
Sodium: 139 mEq/L (ref 135–145)
Total Bilirubin: 0.7 mg/dL (ref 0.2–1.2)
Total Protein: 6.9 g/dL (ref 6.0–8.3)

## 2023-03-07 ENCOUNTER — Telehealth: Payer: Self-pay | Admitting: Nurse Practitioner

## 2023-03-07 NOTE — Telephone Encounter (Signed)
Patient's home health nurse, Amy contacted the office asking if she could add calcium alginate to the patient's wound care. Stats that it would help absorb drainage from the wound and get the patient healed faster. Please advise, thank you.

## 2023-03-07 NOTE — Telephone Encounter (Signed)
Verbal orders ok

## 2023-03-07 NOTE — Telephone Encounter (Signed)
Left message given verbal order.

## 2023-03-08 ENCOUNTER — Ambulatory Visit: Payer: Self-pay

## 2023-03-08 NOTE — Patient Outreach (Signed)
  Care Coordination   Follow Up Visit Note   03/08/2023 Name: Jake Gross MRN: [id number] DOB: Feb 21, 1941  Jake Gross is a 82 y.o. year old male who sees Cable, Genene Churn, NP for primary care. I spoke with  [name] by phone today.  What matters to the patients health and wellness today?  Patient states, " I am doing great."  He states he feels much better overall. Patient reports blood pressure today was 138/60, weight 180 lbs, pulse 70 and blood sugars ranging from 90-101.  He reports today's blood sugar was 101.  Patient stated he knows if blood sugars are 80 or below when waking up  to cut back on insulin 2-3 units and left primary provider office know.  Denies any new or ongoing symptom.    Goals Addressed             This Visit's Progress    Patient stated: decrease blood pressure and post hospital/SNF discharge follow up       Interventions Today    Flowsheet Row Most Recent Value  Chronic Disease   Chronic disease during today's visit Congestive Heart Failure (CHF), Diabetes, Hypertension (HTN), Chronic Kidney Disease/End Stage Renal Disease (ESRD)  General Interventions   General Interventions Discussed/Reviewed General Interventions Reviewed, Doctor Visits, Labs  [evaluation of current treatment plan for HF, HTN, CKD,DM and patients adherence to plan as established by provider.  Assessed for BP, wgt, pulse readings. Assessed for new or ongoing symptoms. Confirmed still receiving HH services]  Labs Kidney Function  [Discussed completed lab work from patients 03/05/23 PCP visit.]  Doctor Visits Discussed/Reviewed Doctor Visits Reviewed  [name] upcoming provider visits.]  Exercise Interventions   Exercise Discussed/Reviewed Physical Activity  Physical Activity Discussed/Reviewed Physical Activity Reviewed  [Encourage patient to do PT exercises as advised.]  Education Interventions   Education Provided Provided Education  Provided Verbal Education On Other   [Discussed importance of monitoring BP, HR and weight.  Advised to record these readings. Reinforced advisement from PCP regarding BS of 80 or below decrease insulin 2-3u then notify provider. Utilized teach back method to confirm patient'undertanding.]  Pharmacy Interventions   Pharmacy Dicussed/Reviewed Pharmacy Topics Reviewed  [medication discussed. Stressed compliance.]                  SDOH assessments and interventions completed:  No     Care Coordination Interventions:  Yes, provided   Follow up plan: Follow up call scheduled for 04/13/23    Encounter Outcome:  Pt. Visit Completed   [name] RN,BSN,CCM [facility] Care Coordination [phone] direct line

## 2023-03-12 DIAGNOSIS — I130 Hypertensive heart and chronic kidney disease with heart failure and stage 1 through stage 4 chronic kidney disease, or unspecified chronic kidney disease: Secondary | ICD-10-CM | POA: Diagnosis not present

## 2023-03-12 DIAGNOSIS — K7460 Unspecified cirrhosis of liver: Secondary | ICD-10-CM | POA: Diagnosis not present

## 2023-03-12 DIAGNOSIS — E1122 Type 2 diabetes mellitus with diabetic chronic kidney disease: Secondary | ICD-10-CM | POA: Diagnosis not present

## 2023-03-12 DIAGNOSIS — I5032 Chronic diastolic (congestive) heart failure: Secondary | ICD-10-CM | POA: Diagnosis not present

## 2023-03-12 DIAGNOSIS — N184 Chronic kidney disease, stage 4 (severe): Secondary | ICD-10-CM | POA: Diagnosis not present

## 2023-03-12 DIAGNOSIS — I480 Paroxysmal atrial fibrillation: Secondary | ICD-10-CM | POA: Diagnosis not present

## 2023-03-13 ENCOUNTER — Encounter: Payer: Self-pay | Admitting: Nurse Practitioner

## 2023-03-13 DIAGNOSIS — I10 Essential (primary) hypertension: Secondary | ICD-10-CM

## 2023-03-13 DIAGNOSIS — I5032 Chronic diastolic (congestive) heart failure: Secondary | ICD-10-CM

## 2023-03-14 DIAGNOSIS — D472 Monoclonal gammopathy: Secondary | ICD-10-CM | POA: Diagnosis not present

## 2023-03-14 DIAGNOSIS — N184 Chronic kidney disease, stage 4 (severe): Secondary | ICD-10-CM | POA: Diagnosis not present

## 2023-03-14 DIAGNOSIS — D631 Anemia in chronic kidney disease: Secondary | ICD-10-CM | POA: Diagnosis not present

## 2023-03-14 DIAGNOSIS — I129 Hypertensive chronic kidney disease with stage 1 through stage 4 chronic kidney disease, or unspecified chronic kidney disease: Secondary | ICD-10-CM | POA: Diagnosis not present

## 2023-03-14 DIAGNOSIS — E1122 Type 2 diabetes mellitus with diabetic chronic kidney disease: Secondary | ICD-10-CM | POA: Diagnosis not present

## 2023-03-14 NOTE — Telephone Encounter (Signed)
Needs a lab visit in 1 week to recheck kidneys with torsemide use

## 2023-03-15 LAB — LAB REPORT - SCANNED
Albumin, Urine POC: 891.2
Albumin/Creatinine Ratio, Urine, POC: 1001
Creatinine, POC: 89 mg/dL
EGFR: 40

## 2023-03-15 NOTE — Telephone Encounter (Signed)
Called pt and informed pt of this information. Lab only appt made for 1 week 03/22/2023

## 2023-03-19 ENCOUNTER — Other Ambulatory Visit: Payer: Self-pay

## 2023-03-19 ENCOUNTER — Telehealth: Payer: Self-pay | Admitting: Nurse Practitioner

## 2023-03-19 DIAGNOSIS — E119 Type 2 diabetes mellitus without complications: Secondary | ICD-10-CM

## 2023-03-19 DIAGNOSIS — I480 Paroxysmal atrial fibrillation: Secondary | ICD-10-CM | POA: Diagnosis not present

## 2023-03-19 DIAGNOSIS — I5032 Chronic diastolic (congestive) heart failure: Secondary | ICD-10-CM | POA: Diagnosis not present

## 2023-03-19 DIAGNOSIS — N184 Chronic kidney disease, stage 4 (severe): Secondary | ICD-10-CM | POA: Diagnosis not present

## 2023-03-19 DIAGNOSIS — I130 Hypertensive heart and chronic kidney disease with heart failure and stage 1 through stage 4 chronic kidney disease, or unspecified chronic kidney disease: Secondary | ICD-10-CM | POA: Diagnosis not present

## 2023-03-19 DIAGNOSIS — E1122 Type 2 diabetes mellitus with diabetic chronic kidney disease: Secondary | ICD-10-CM | POA: Diagnosis not present

## 2023-03-19 DIAGNOSIS — K7460 Unspecified cirrhosis of liver: Secondary | ICD-10-CM | POA: Diagnosis not present

## 2023-03-19 NOTE — Telephone Encounter (Signed)
Prescription Request  03/19/2023  LOV: 03/05/2023  What is the name of the medication or equipment?   insulin glargine-yfgn (SEMGLEE) 100 UNIT/ML Pen    Have you contacted your pharmacy to request a refill? YES   Which pharmacy would you like this sent to?  [id number] [place], [place] - [address] [place] [address] Phone: [phone] Fax: [phone]    Patient notified that their request is being sent to the clinical staff for review and that they should receive a response within 2 business days.   Please advise at  [phone]  [name], Physical therapist, stated the pt has very little of medicine left.

## 2023-03-19 NOTE — Telephone Encounter (Signed)
Rx request sent to provider

## 2023-03-19 NOTE — Telephone Encounter (Signed)
insulin glargine-yfgn (SEMGLEE) 100 UNIT/ML Pen LOV 03/05/2023 NOV 06/11/2023

## 2023-03-20 ENCOUNTER — Other Ambulatory Visit: Payer: Self-pay | Admitting: Nurse Practitioner

## 2023-03-20 DIAGNOSIS — E119 Type 2 diabetes mellitus without complications: Secondary | ICD-10-CM

## 2023-03-20 MED ORDER — INSULIN GLARGINE-YFGN 100 UNIT/ML ~~LOC~~ SOPN
55.0000 [IU] | PEN_INJECTOR | Freq: Every day | SUBCUTANEOUS | 0 refills | Status: DC
Start: 2023-03-20 — End: 2023-05-17

## 2023-03-21 ENCOUNTER — Telehealth: Payer: Self-pay

## 2023-03-21 NOTE — Telephone Encounter (Signed)
Got notice that insulin is not covered. Thought we went through this last time. Can we verify with the patient if he was not able to get insulin

## 2023-03-21 NOTE — Progress Notes (Signed)
Care Management & Coordination Services Pharmacy Team  Reason for Encounter: Appointment Reminder  Contacted patient to confirm telephone appointment with Al Corpus , PharmD on 03/26/23 at 3:45. Unsuccessful outreach. Left voicemail for patient to return call.  Have you seen any other providers since your last visit with PCP? No   Hospital visits:  02/01/23 - 02/05/23 Admission [facility]): dehydration, AKI in s/o N/V. Given IVF. Discontinue Farxiga, HCTZ. D/C metoprolol d/t bradycardia. Reduce torsemide to 20 mg once daily. Resume imdur, hydralazine, amlodipine at home dose. D/C to SNF.   Star Rating Drugs:  Medication:  Last Fill: Day Supply Semglee 100 unit/ml 11/11/22 81 Rosuvastatin 20mg  01/10/23 90   Care Gaps: Annual wellness visit in last year? Yes  If Diabetic: Last eye exam / retinopathy screening:2018 Last diabetic foot examUTD  Al Corpus, CPP notified   Burt Knack, [facility] concierge  [phone]

## 2023-03-22 ENCOUNTER — Other Ambulatory Visit: Payer: Medicare Other

## 2023-03-22 NOTE — Telephone Encounter (Signed)
Called patient was able to pick up with no issues.

## 2023-03-26 ENCOUNTER — Ambulatory Visit: Payer: Medicare Other | Admitting: Pharmacist

## 2023-03-26 ENCOUNTER — Telehealth: Payer: Self-pay | Admitting: Cardiovascular Disease

## 2023-03-26 DIAGNOSIS — E1122 Type 2 diabetes mellitus with diabetic chronic kidney disease: Secondary | ICD-10-CM | POA: Diagnosis not present

## 2023-03-26 DIAGNOSIS — I130 Hypertensive heart and chronic kidney disease with heart failure and stage 1 through stage 4 chronic kidney disease, or unspecified chronic kidney disease: Secondary | ICD-10-CM | POA: Diagnosis not present

## 2023-03-26 DIAGNOSIS — K7460 Unspecified cirrhosis of liver: Secondary | ICD-10-CM | POA: Diagnosis not present

## 2023-03-26 DIAGNOSIS — I5032 Chronic diastolic (congestive) heart failure: Secondary | ICD-10-CM | POA: Diagnosis not present

## 2023-03-26 DIAGNOSIS — I480 Paroxysmal atrial fibrillation: Secondary | ICD-10-CM | POA: Diagnosis not present

## 2023-03-26 DIAGNOSIS — N184 Chronic kidney disease, stage 4 (severe): Secondary | ICD-10-CM | POA: Diagnosis not present

## 2023-03-26 NOTE — Progress Notes (Signed)
Care Management & Coordination Services Pharmacy Note  03/26/2023 Name:  Jake Gross MRN:  [id number] DOB:  05-03-1941  Summary: F/U Televisit. Spoke with patient and daughter Wilkie Aye -HF / HTN: BP 160/70 per [facility] RN today; it is unclear if patient is taking hydralazine (last filled 09/2022 x 30 ds); he is taking amlodipine 10 mg/day and isosorbide 60 mg -Wt stable at 180 lbs; per nephrology he is given permission to take extra torsemide for swelling PRN -Afib: pt reports Eliquis is cost-prohibitive; discussed Eliquis PAP requirements, daughter will obtain OOP costs for 2024 from pharmacy (annual income ~$36,000, 3% OOP requirement is ~$1080) -DM: A1c 6.0% (02/2023) at goal; pt denies hypoglycemia -HLD / CAD: LDL 98 (04/2022) above goal, though according to fill dates for statin pt was likely not adherent to statin at time of labwork; he endorses adherence in 2024  Recommendations/Changes made from today's visit: -Daughter will check pt's home for hydralazine; recommend to resume hydralazine if not taking -Advised to contact provider if he has to take extra torsemide more than once a week -Re-check lipid panel and A1c at next OV  Follow up plan: -CC RN 04/13/23 -Pharmacist follow up televisit scheduled 6 weeks -PCP 06/11/23     Subjective: Jake Gross is an 82 y.o. year old male who is a primary patient of Eden Emms, NP.  The care coordination team was consulted for assistance with disease management and care coordination needs.    Engaged with patient by telephone for initial visit. Patient lives at home alone. He has son and daughter who live nearby and check in on him often. His son fills up his pillbox each week.   Recent office visits: 03/05/23 NP Audria Nine OV: DM - Cr 1.76 down from 2.26.   11/09/22 NP Matt Cable OV: f/u - A1c 6.4%; reduce Semglee to 55 units. Advised to schedule cardiology appt. F/u 3 months.  Recent consult visits: 03/14/23 Dr Valentino Nose  (Nephrology): continue torsemide 20 mg daily, ok to increase to BID x 3d if needed for edema; restart isosorbide. If labs remain stable, restart Marcelline Deist next visit.  02/20/23 Dr Valentino Nose (Nephrology): hosp f/u - Cr returned to baseline. F/u 1 month. Consider retrial of ARB/SGLT2 in future when more stable.  11/20/22 Dr Clifton James (Cardiology): stable, no changes.  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  Hospital visits: 02/01/23 - 02/05/23 Admission [facility]): dehydration, AKI in s/o N/V. Given IVF. Discontinue Farxiga, HCTZ. D/C metoprolol d/t bradycardia. Reduce torsemide to 20 mg once daily. Resume imdur, hydralazine, amlodipine at home dose. D/C to SNF.  01/24/23 - 01/26/23 Admission [facility]): acute renal failure, frequent falls. Given IV fluids, resume home diuretics at d/c.  09/27/22 - 10/03/22 Admission [facility]): acute renal failure - avoid hypotension and nephrotoxic agents. Hold diuretics.  Objective:  Lab Results  Component Value Date   CREATININE 1.76 (H) 03/05/2023   BUN 35 (H) 03/05/2023   GFR 36.15 (L) 03/05/2023   EGFR 40.0 03/15/2023   GFRNONAA 29 (L) 02/05/2023   GFRAA 32 (L) 10/27/2019   NA 139 03/05/2023   K 4.2 03/05/2023   CALCIUM 8.9 03/05/2023   CO2 22 03/05/2023   GLUCOSE 129 (H) 03/05/2023    Lab Results  Component Value Date/Time   HGBA1C 6.0 (A) 03/05/2023 02:24 PM   HGBA1C 6.4 (A) 11/09/2022 04:12 PM   HGBA1C 6.9 (H) 04/27/2022 02:37 PM   HGBA1C 7.1 (H) 11/26/2018 04:26 AM   FRUCTOSAMINE 197 (L) 07/10/2019 01:24 PM   GFR 36.15 (  L) 03/05/2023 02:42 PM   GFR 23.66 (L) 11/09/2022 04:33 PM    Last diabetic Eye exam: No results found for: "HMDIABEYEEXA"  Last diabetic Foot exam: No results found for: "HMDIABFOOTEX"   Lab Results  Component Value Date   CHOL 187 04/27/2022   HDL 23.70 (L) 04/27/2022   LDLCALC 98 10/28/2021   LDLDIRECT 98.0 04/27/2022   TRIG (H) 04/27/2022    486.0 Triglyceride is over 400; calculations on Lipids are invalid.   CHOLHDL  8 04/27/2022       Latest Ref Rng & Units 03/05/2023    2:42 PM 02/03/2023    3:30 AM 02/02/2023    1:32 AM  Hepatic Function  Total Protein 6.0 - 8.3 g/dL 6.9  6.1  6.7   Albumin 3.5 - 5.2 g/dL 3.5  2.6  2.9   AST 0 - 37 U/L 18  17  18    ALT 0 - 53 U/L 8  11  12    Alk Phosphatase 39 - 117 U/L 76  74  83   Total Bilirubin 0.2 - 1.2 mg/dL 0.7  0.7  1.4     Lab Results  Component Value Date/Time   TSH 0.731 02/02/2023 01:32 AM   TSH 1.445 08/27/2019 06:49 PM       Latest Ref Rng & Units 03/05/2023    2:42 PM 02/04/2023    3:41 AM 02/03/2023    3:30 AM  CBC  WBC 4.0 - 10.5 K/uL 10.4  9.2  10.7   Hemoglobin 13.0 - 17.0 g/dL 47.8  29.5  62.1   Hematocrit 39.0 - 52.0 % 35.7  34.9  36.2   Platelets 150.0 - 400.0 K/uL 249.0  193  221     Lab Results  Component Value Date/Time   VITAMINB12 211 02/03/2023 03:30 AM   VITAMINB12 231 06/27/2021 12:52 PM    Clinical ASCVD: Yes  The ASCVD Risk score (Arnett DK, et al., 2019) failed to calculate for the following reasons:   The 2019 ASCVD risk score is only valid for ages 45 to 23   The patient has a prior MI or stroke diagnosis    CHA2DS2/VAS Stroke Risk Points    8 >= 2 Points: High Risk       03/05/2023    2:23 PM 12/05/2022    2:26 PM 10/28/2021    2:04 PM  Depression screen PHQ 2/9  Decreased Interest 0 0 0  Down, Depressed, Hopeless 0 0 0  PHQ - 2 Score 0 0 0  Altered sleeping 0    Tired, decreased energy 0    Change in appetite 0    Feeling bad or failure about yourself  0    Trouble concentrating 0    Moving slowly or fidgety/restless 0    Suicidal thoughts 0    PHQ-9 Score 0    Difficult doing work/chores Not difficult at all       Social History   Tobacco Use  Smoking Status Former   Types: Cigarettes   Quit date: 08/13/1994   Years since quitting: 28.6  Smokeless Tobacco Never   BP Readings from Last 3 Encounters:  03/05/23 138/60  02/05/23 (!) 157/73  01/26/23 (!) 140/68   Pulse Readings from  Last 3 Encounters:  03/05/23 77  02/05/23 65  01/26/23 66   Wt Readings from Last 3 Encounters:  03/05/23 184 lb 4 oz (83.6 kg)  02/01/23 180 lb (81.6 kg)  01/24/23 190 lb (86.2 kg)  BMI Readings from Last 3 Encounters:  03/05/23 28.86 kg/m  02/01/23 28.19 kg/m  01/24/23 29.76 kg/m    No Known Allergies  Medications Reviewed Today     Reviewed by Kathyrn Sheriff, [facility] (Pharmacist) on 03/26/23 at 1650  Med List Status: <None>   Medication Order Taking? Sig Documenting Provider Last Dose Status Informant  acetaminophen (TYLENOL) 325 MG tablet [id number] Yes Take 2 tablets (650 mg total) by mouth every 6 (six) hours as needed for mild pain (or Fever >/= 101). Elgergawy, Leana Roe, MD Taking Active   amLODipine (NORVASC) 5 MG tablet [id number] Yes TAKE 1 TABLET (5 MG TOTAL) BY MOUTH DAILY. PLEASE CALL TO SCHEDULE AN OVERDUE APPOINTMENT  Patient taking differently: Take 5 mg by mouth 2 (two) times daily.   Kathleene Hazel, MD Taking Active Child, Pharmacy Records           Med Note Marco Collie Mar 26, 2023  4:18 PM) Started BID dosing 6/1 per nephrology recommendation  apixaban (ELIQUIS) 2.5 MG TABS tablet [id number] Yes Take 1 tablet (2.5 mg total) by mouth 2 (two) times daily. Kathleene Hazel, MD Taking Active Child, Pharmacy Records  hydrALAZINE (APRESOLINE) 100 MG tablet [id number] Yes Take 1 tablet (100 mg total) by mouth 3 (three) times daily. Please call to schedule an overdue appointment with Dr. Clifton James for refills, [phone], thank you. 1st attempt. Kathleene Hazel, MD Taking Active Pharmacy Records, Child           Med Note Jola Schmidt   Fri Feb 02, 2023  7:27 AM) Dispense records do not support.  insulin glargine-yfgn (SEMGLEE) 100 UNIT/ML Pen [id number] Yes Inject 55 Units into the skin daily. Eden Emms, NP Taking Active   Insulin Pen Needle (BD PEN NEEDLE NANO 2ND GEN) 32G X 4 MM MISC [id number] Yes Use with insulin  pen as directed Eden Emms, NP Taking Active Pharmacy Records, Child  isosorbide mononitrate (IMDUR) 60 MG 24 hr tablet [id number] Yes TAKE 1 TABLET BY MOUTH EVERY DAY (NEED OFFICE VISIT) Kathleene Hazel, MD Taking Active Child, Pharmacy Records  Lancets [facility] ULTRASOFT) lancets [id number] Yes Use as instructed Romero Belling, MD Taking Active Pharmacy Records, Child  rosuvastatin (CRESTOR) 20 MG tablet [id number] Yes TAKE 1 TABLET BY MOUTH AT BEDTIME. SCHEDULE PHYSICAL EXAM Eden Emms, NP Taking Active Child, Pharmacy Records  tamsulosin [facility]) 0.4 MG CAPS capsule [id number] No TAKE 1 CAPSULE BY MOUTH EVERY DAY  Patient not taking: Reported on 03/26/2023   Lorre Munroe, NP Not Taking Active Pharmacy Records, Child           Med Note Maida Sale Feb 28, 2023  2:23 PM) Rx ran out ~Feb 2024, overdue for urology f/u  torsemide (DEMADEX) 20 MG tablet [id number] Yes Take 1 tablet (20 mg total) by mouth daily. Elgergawy, Leana Roe, MD Taking Active             SDOH:  (Social Determinants of Health) assessments and interventions performed: No SDOH Interventions    Flowsheet Row Telephone from 01/30/2023 in [facility] Coordination Clinical Support from 12/05/2022 in [facility] Coordination from 06/20/2022 in [facility] Coordination Patient Outreach Telephone from 11/14/2021 in [facility] Coord[facility] Patient Outreach Telephone from 09/22/2020 in [facility] Coord[facility]  SDOH Interventions       Food Insecurity  Interventions Intervention Not Indicated Intervention Not Indicated Intervention Not Indicated Intervention Not Indicated Intervention Not Indicated  Housing Interventions Intervention Not Indicated Intervention Not Indicated -- Intervention Not Indicated Intervention Not Indicated  Transportation Interventions  Intervention Not Indicated Intervention Not Indicated Intervention Not Indicated Intervention Not Indicated Intervention Not Indicated  Utilities Interventions -- Intervention Not Indicated -- -- --  Alcohol Usage Interventions -- Intervention Not Indicated (Score <7) -- -- --  Financial Strain Interventions -- Intervention Not Indicated -- -- --  Physical Activity Interventions -- Intervention Not Indicated -- -- --  Stress Interventions -- Intervention Not Indicated -- -- --  Social Connections Interventions -- Intervention Not Indicated -- -- --       Medication Assistance: None required.  Patient affirms current coverage meets needs.  Medication Access: Within the past 30 days, how often has patient missed a dose of medication? 0 Is a pillbox or other method used to improve adherence? Yes  Factors that may affect medication adherence? lack of understanding of disease management and multiple med lists Are meds synced by current pharmacy? No  Are meds delivered by current pharmacy? No  Does patient experience delays in picking up medications due to transportation concerns? No  Current Rx insurance plan: Express scripts Name and location of Current pharmacy:  [id number] [place], [place] - [address] [facility] [address] [place] [address] Phone: [phone] Fax: [phone]  Compliance/Adherence/Medication fill history: Care Gaps: UACR  Eye exam (due 01/2018)  Star-Rating Drugs: Rosuvastatin - PDC 92%   Assessment/Plan  Heart Failure / Hypertension (Goal: BP < 150/90) -Not ideally controlled - BP elevated at home today; unclear if pt is taking hydralazine, last filled 09/2022 x 30 ds -Multiple hospitalizations for dehydration/AKI; reviewed hypotension is more dangerous than high BP at this time -Current home BP/HR readings: 160/70 today -Current home daily weights: 180# -Last ejection fraction: 60-65% (Date: 01/2023) -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%) -NYHA Class: II -AHA HF Stage: C (Heart disease and symptoms present) -Current treatment: Amlodipine 5 mg BID - Appropriate, Effective, Safe, Accessible Hydralazine 100 mg TID - unclear if taking Isosorbide MN 60 mg daily -Appropriate, Effective, Safe, Accessible Torsemide 20 mg daily - Appropriate, Effective, Safe, Accessible -Medications previously tried: metoprolol, Farxiga, chlorthalidone -Educated on Benefits of medications for managing symptoms and prolonging life Importance of weighing daily; if you gain more than 3 pounds in one day or 5 pounds in one week, contact provider -Counseled to continue monitoring daily weight and BP/HR -Advised to resume hydralazine if not taking; CC RN to follow up in 2 weeks for update  Atrial Fibrillation (Goal: prevent stroke and major bleeding) -Controlled - pt reports compliance with Eliquis but it is cost prohibitive; -CHADSVASC: 8 -Current treatment: Eliquis 2.5 mg BID - Appropriate, Effective, Safe, Not Accessible -Medications previously tried: metoprolol (bradycardia) -Discussed PAP requirements for Eliquis - reported annual income ~$36,000, 3% OOP requirement is ~$1080; daughter will obtain OOP costs from pharmacy and follow up with me -Recommended to continue current medication  Hyperlipidemia: (LDL goal < 70) -Not ideally controlled - LDL 98 (04/2022) above goal, though according to fill dates for statin pt was likely not adherent to statin at time of labwork; he endorses adherence in 2024 -Hx CAD (CABG), hx stroke; no aspirin d/t Eliquis, high fall risk -Current treatment: Rosuvastatin 20 mg daily - Appropriate, Query Effective Isosorbide MN 60 mg daily - Appropriate, Effective, Safe, Accessible -Medications previously tried: n/a  -  Educated on Cholesterol goals; Benefits of statin for ASCVD risk reduction; -Recommended to continue current medication; recheck lipid panel at next OV  Diabetes (A1c goal <7%) -Controlled  - A1c 6.4% (10/2022) at goal -Current home glucose readings fasting glucose: 101 -Denies hypoglycemic/hyperglycemic symptoms -Current medications: Semglee 55 units daily - Appropriate, Effective, Safe, Accessible -Medications previously tried: [place] (dehydration)  -Educated on A1c and blood sugar goals; Prevention and management of hypoglycemic episodes; -Recommended to continue current medication; recheck A1c at next OV  Frequent falls (Goal: prevent falls) -Pt has history of falls in s/o dehydration, dizziness; he has home health right now and working on balance; he feels 100% better now compared to hospitalizations in April 2024  Chronic Kidney Disease Stage 4  -All medications assessed for renal dosing and appropriateness in chronic kidney disease. -Follows with nephrology (Dr Valentino Nose) -Pt hospitalized twice in April 2024 for AKI on CKD related to dehydration, hypotension; recently [place] on hold, per nephrology may consider restarting Farxiga as pt stabilizes  Health Maintenance -Vaccine gaps: Shingrix -BPH: tamsulosin 0.4 mg - not taking, Rx ran out as he is overdue for urology follow up   Al Corpus, PharmD, P[place]y [place], CPP Clinical Pharmacist Practitioner [facility] [phone]

## 2023-03-26 NOTE — Telephone Encounter (Signed)
   [name] RN calling, she said, pt had medications changes for the last couple of weeks. And pt is having off and on increased SOB and swelling. Pt's kidney doctor advised pt is safe to take extra fluid pill but she is afraid to give medications since pt had recent medications changes. Pt is down to 178 lbs but he normally weight 180 lbs. She would like to know if pt can be seen by Dr. Clifton James to check patient

## 2023-03-27 DIAGNOSIS — K7460 Unspecified cirrhosis of liver: Secondary | ICD-10-CM | POA: Diagnosis not present

## 2023-03-27 DIAGNOSIS — I480 Paroxysmal atrial fibrillation: Secondary | ICD-10-CM | POA: Diagnosis not present

## 2023-03-27 DIAGNOSIS — N184 Chronic kidney disease, stage 4 (severe): Secondary | ICD-10-CM | POA: Diagnosis not present

## 2023-03-27 DIAGNOSIS — I5032 Chronic diastolic (congestive) heart failure: Secondary | ICD-10-CM | POA: Diagnosis not present

## 2023-03-27 DIAGNOSIS — E1122 Type 2 diabetes mellitus with diabetic chronic kidney disease: Secondary | ICD-10-CM | POA: Diagnosis not present

## 2023-03-27 DIAGNOSIS — I130 Hypertensive heart and chronic kidney disease with heart failure and stage 1 through stage 4 chronic kidney disease, or unspecified chronic kidney disease: Secondary | ICD-10-CM | POA: Diagnosis not present

## 2023-03-27 NOTE — Telephone Encounter (Signed)
Spoke with [facility] nurse, [name]: [phone] Informed he has follow up with APP 04/10/23.   She is wanting him to have a hospital follow up with labs, any parameters for when he can take extra torsemide.  I adv per the last renal visit on 03/14/23 they are okay w him doubling torsemide x 3 days as needed for edema.  The patient is not acute and no changes are requested right now. She is gong to recertify him and keep a close eye on him and is happy to get labs on him if needed.

## 2023-03-28 DIAGNOSIS — Z8673 Personal history of transient ischemic attack (TIA), and cerebral infarction without residual deficits: Secondary | ICD-10-CM | POA: Diagnosis not present

## 2023-03-28 DIAGNOSIS — K7460 Unspecified cirrhosis of liver: Secondary | ICD-10-CM | POA: Diagnosis not present

## 2023-03-28 DIAGNOSIS — E1122 Type 2 diabetes mellitus with diabetic chronic kidney disease: Secondary | ICD-10-CM | POA: Diagnosis not present

## 2023-03-28 DIAGNOSIS — I130 Hypertensive heart and chronic kidney disease with heart failure and stage 1 through stage 4 chronic kidney disease, or unspecified chronic kidney disease: Secondary | ICD-10-CM | POA: Diagnosis not present

## 2023-03-28 DIAGNOSIS — D509 Iron deficiency anemia, unspecified: Secondary | ICD-10-CM | POA: Diagnosis not present

## 2023-03-28 DIAGNOSIS — I5032 Chronic diastolic (congestive) heart failure: Secondary | ICD-10-CM | POA: Diagnosis not present

## 2023-03-28 DIAGNOSIS — Z7984 Long term (current) use of oral hypoglycemic drugs: Secondary | ICD-10-CM | POA: Diagnosis not present

## 2023-03-28 DIAGNOSIS — Z7901 Long term (current) use of anticoagulants: Secondary | ICD-10-CM | POA: Diagnosis not present

## 2023-03-28 DIAGNOSIS — E785 Hyperlipidemia, unspecified: Secondary | ICD-10-CM | POA: Diagnosis not present

## 2023-03-28 DIAGNOSIS — I2510 Atherosclerotic heart disease of native coronary artery without angina pectoris: Secondary | ICD-10-CM | POA: Diagnosis not present

## 2023-03-28 DIAGNOSIS — N184 Chronic kidney disease, stage 4 (severe): Secondary | ICD-10-CM | POA: Diagnosis not present

## 2023-03-28 DIAGNOSIS — Z9181 History of falling: Secondary | ICD-10-CM | POA: Diagnosis not present

## 2023-03-28 DIAGNOSIS — I480 Paroxysmal atrial fibrillation: Secondary | ICD-10-CM | POA: Diagnosis not present

## 2023-03-28 DIAGNOSIS — Z87891 Personal history of nicotine dependence: Secondary | ICD-10-CM | POA: Diagnosis not present

## 2023-03-29 DIAGNOSIS — I10 Essential (primary) hypertension: Secondary | ICD-10-CM | POA: Diagnosis not present

## 2023-03-29 DIAGNOSIS — R61 Generalized hyperhidrosis: Secondary | ICD-10-CM | POA: Diagnosis not present

## 2023-03-29 DIAGNOSIS — R404 Transient alteration of awareness: Secondary | ICD-10-CM | POA: Diagnosis not present

## 2023-03-29 DIAGNOSIS — E162 Hypoglycemia, unspecified: Secondary | ICD-10-CM | POA: Diagnosis not present

## 2023-03-29 DIAGNOSIS — E161 Other hypoglycemia: Secondary | ICD-10-CM | POA: Diagnosis not present

## 2023-03-30 ENCOUNTER — Telehealth: Payer: Self-pay | Admitting: Nurse Practitioner

## 2023-03-30 DIAGNOSIS — E119 Type 2 diabetes mellitus without complications: Secondary | ICD-10-CM

## 2023-03-30 DIAGNOSIS — E1122 Type 2 diabetes mellitus with diabetic chronic kidney disease: Secondary | ICD-10-CM | POA: Diagnosis not present

## 2023-03-30 DIAGNOSIS — N184 Chronic kidney disease, stage 4 (severe): Secondary | ICD-10-CM | POA: Diagnosis not present

## 2023-03-30 DIAGNOSIS — I5032 Chronic diastolic (congestive) heart failure: Secondary | ICD-10-CM | POA: Diagnosis not present

## 2023-03-30 DIAGNOSIS — K7460 Unspecified cirrhosis of liver: Secondary | ICD-10-CM | POA: Diagnosis not present

## 2023-03-30 DIAGNOSIS — I130 Hypertensive heart and chronic kidney disease with heart failure and stage 1 through stage 4 chronic kidney disease, or unspecified chronic kidney disease: Secondary | ICD-10-CM | POA: Diagnosis not present

## 2023-03-30 DIAGNOSIS — I480 Paroxysmal atrial fibrillation: Secondary | ICD-10-CM | POA: Diagnosis not present

## 2023-03-30 MED ORDER — FREESTYLE LIBRE 2 READER DEVI
0 refills | Status: DC
Start: 2023-03-30 — End: 2024-04-29

## 2023-03-30 MED ORDER — FREESTYLE LIBRE 2 SENSOR MISC
1 refills | Status: DC
Start: 2023-03-30 — End: 2024-04-29

## 2023-03-30 NOTE — Telephone Encounter (Signed)
[name] for [facility] called to report yesterday EMS had to come  see patient blood sugar was 33. They took his glucometer with them. She was looking through his strips and monitor and everything is expired.She would ike to know if he could possibly receive a freestyle libre because it will be more convenient  for patient. Right now the patient doesn't have anything to check blood sugar.She is requesting a call back to discuss this.

## 2023-03-30 NOTE — Telephone Encounter (Signed)
Called and spoke to Jake Gross. She states yes he has been taking insulin as recommended. If the freestyle is not covered will let us know so we could call in a regular glucometer. She will see him next week and will reach out to [place] if any issues.

## 2023-03-30 NOTE — Telephone Encounter (Signed)
I dont see a number listed to call her back. Can we call and see how the patient is doing. Did he take his normal amount of insulin. I dont care to send in a free style Josephine Igo but I do not know if his insurance will cover it  I have sent in the senor and reader for a free style

## 2023-03-30 NOTE — Telephone Encounter (Signed)
Called Jake Gross [phone]  left message to call office.

## 2023-04-02 ENCOUNTER — Other Ambulatory Visit: Payer: Self-pay | Admitting: Nurse Practitioner

## 2023-04-02 ENCOUNTER — Telehealth: Payer: Self-pay | Admitting: Nurse Practitioner

## 2023-04-02 ENCOUNTER — Other Ambulatory Visit (HOSPITAL_COMMUNITY): Payer: Self-pay

## 2023-04-02 DIAGNOSIS — I130 Hypertensive heart and chronic kidney disease with heart failure and stage 1 through stage 4 chronic kidney disease, or unspecified chronic kidney disease: Secondary | ICD-10-CM | POA: Diagnosis not present

## 2023-04-02 DIAGNOSIS — E119 Type 2 diabetes mellitus without complications: Secondary | ICD-10-CM

## 2023-04-02 DIAGNOSIS — N184 Chronic kidney disease, stage 4 (severe): Secondary | ICD-10-CM | POA: Diagnosis not present

## 2023-04-02 DIAGNOSIS — I480 Paroxysmal atrial fibrillation: Secondary | ICD-10-CM | POA: Diagnosis not present

## 2023-04-02 DIAGNOSIS — I5032 Chronic diastolic (congestive) heart failure: Secondary | ICD-10-CM | POA: Diagnosis not present

## 2023-04-02 DIAGNOSIS — E1122 Type 2 diabetes mellitus with diabetic chronic kidney disease: Secondary | ICD-10-CM | POA: Diagnosis not present

## 2023-04-02 DIAGNOSIS — K7460 Unspecified cirrhosis of liver: Secondary | ICD-10-CM | POA: Diagnosis not present

## 2023-04-02 NOTE — Telephone Encounter (Signed)
Noted on the blood pressure  Rx pool - Can we still do the PA and see if it will be covered with the one insulin and episodes of hypoglycemia

## 2023-04-02 NOTE — Telephone Encounter (Signed)
PA needed

## 2023-04-02 NOTE — Telephone Encounter (Signed)
FreeStyle Libre and other CGM's are typically only covered for intense insulin therapy (3+ injections).

## 2023-04-02 NOTE — Telephone Encounter (Signed)
[name] from [facility] called over and stated that [facility] stating that he isn't taking insulin. She stated that they need a PA for the monitor and needing it to state that the patient is taking insulin. She stated that his blood pressure was elevated this morning at 16/70 with no symptoms, but he still have medication to take. She stated that he is now at 176lbs. Thank you!

## 2023-04-03 ENCOUNTER — Other Ambulatory Visit (HOSPITAL_COMMUNITY): Payer: Self-pay

## 2023-04-03 DIAGNOSIS — N184 Chronic kidney disease, stage 4 (severe): Secondary | ICD-10-CM | POA: Diagnosis not present

## 2023-04-03 DIAGNOSIS — E1122 Type 2 diabetes mellitus with diabetic chronic kidney disease: Secondary | ICD-10-CM | POA: Diagnosis not present

## 2023-04-03 DIAGNOSIS — K7460 Unspecified cirrhosis of liver: Secondary | ICD-10-CM | POA: Diagnosis not present

## 2023-04-03 DIAGNOSIS — I130 Hypertensive heart and chronic kidney disease with heart failure and stage 1 through stage 4 chronic kidney disease, or unspecified chronic kidney disease: Secondary | ICD-10-CM | POA: Diagnosis not present

## 2023-04-03 DIAGNOSIS — I5032 Chronic diastolic (congestive) heart failure: Secondary | ICD-10-CM | POA: Diagnosis not present

## 2023-04-03 DIAGNOSIS — I480 Paroxysmal atrial fibrillation: Secondary | ICD-10-CM | POA: Diagnosis not present

## 2023-04-03 NOTE — Telephone Encounter (Signed)
Freestyle must be ran through [facility]. Called pharmacy and they got a rejection. Pharmacy will fax over the rejection and what is needed to process prescription through Medicare.

## 2023-04-03 NOTE — Telephone Encounter (Signed)
[facility], prescriber documentation is required. Prescriber may fax records to [phone].

## 2023-04-09 DIAGNOSIS — I130 Hypertensive heart and chronic kidney disease with heart failure and stage 1 through stage 4 chronic kidney disease, or unspecified chronic kidney disease: Secondary | ICD-10-CM | POA: Diagnosis not present

## 2023-04-09 DIAGNOSIS — N184 Chronic kidney disease, stage 4 (severe): Secondary | ICD-10-CM | POA: Diagnosis not present

## 2023-04-09 DIAGNOSIS — I480 Paroxysmal atrial fibrillation: Secondary | ICD-10-CM | POA: Diagnosis not present

## 2023-04-09 DIAGNOSIS — K7460 Unspecified cirrhosis of liver: Secondary | ICD-10-CM | POA: Diagnosis not present

## 2023-04-09 DIAGNOSIS — I5032 Chronic diastolic (congestive) heart failure: Secondary | ICD-10-CM | POA: Diagnosis not present

## 2023-04-09 DIAGNOSIS — E1122 Type 2 diabetes mellitus with diabetic chronic kidney disease: Secondary | ICD-10-CM | POA: Diagnosis not present

## 2023-04-10 ENCOUNTER — Ambulatory Visit: Payer: Medicare Other | Attending: Physician Assistant | Admitting: Physician Assistant

## 2023-04-10 ENCOUNTER — Encounter: Payer: Self-pay | Admitting: Physician Assistant

## 2023-04-10 VITALS — BP 150/64 | HR 74 | Ht 67.0 in | Wt 189.0 lb

## 2023-04-10 DIAGNOSIS — I5032 Chronic diastolic (congestive) heart failure: Secondary | ICD-10-CM | POA: Insufficient documentation

## 2023-04-10 DIAGNOSIS — I480 Paroxysmal atrial fibrillation: Secondary | ICD-10-CM | POA: Diagnosis not present

## 2023-04-10 DIAGNOSIS — I2510 Atherosclerotic heart disease of native coronary artery without angina pectoris: Secondary | ICD-10-CM | POA: Diagnosis not present

## 2023-04-10 DIAGNOSIS — I10 Essential (primary) hypertension: Secondary | ICD-10-CM | POA: Diagnosis not present

## 2023-04-10 DIAGNOSIS — N184 Chronic kidney disease, stage 4 (severe): Secondary | ICD-10-CM | POA: Insufficient documentation

## 2023-04-10 NOTE — Assessment & Plan Note (Signed)
The electrocardiogram that was performed by EMS when he had his hypoglycemic episode continue to demonstrate sinus rhythm.  He was taken off of beta-blocker therapy in the hospital secondary to bradycardia.  He has not had any symptoms of syncope or near syncope.  He is 82 years old and his creatinine has been above 1.5.  Continue Eliquis 2.5 mg twice daily.  Follow-up 3 to 4 months.

## 2023-04-10 NOTE — Progress Notes (Signed)
Cardiology Office Note:    Date:  04/10/2023  ID:  [name], DOB [birth year], MRN [id number] PCP: Eden Emms, NP  [facility] Providers Cardiologist:  Verne Carrow, MD Electrophysiologist:  Hillis Range, MD (Inactive)       Patient Profile:      Coronary artery disease S/p CABG in 11/2018 (HFpEF) heart failure with preserved ejection fraction  TTE 02/04/2023: EF 60-65, no RWMA, mild LVH, mildly reduced RVSF, mild LAE, trivial MR, trivial AI, borderline dilation of ascending aorta (41 mm), RAP 3 Paroxysmal atrial fibrillation  Hypertension  Hyperlipidemia  Diabetes mellitus  Chronic kidney disease 4 Hx of CVA Hx of GI bleed in 08/2019 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 male who returns for follow-up of CAD, CHF, A-fib.  He was last seen by Dr. Clifton James 11/20/22. He was admitted 4/10-4/12 with AKI on chronic kidney disease in the setting of gastroenteritis. His SCr was 3.39 on admission but improved to 2.71 at DC w IVFs. He was admitted again 4/18-4/22 with nausea, vomiting, AKI on chronic kidney disease, weakness. His SCr was up to 3.4. He was treated with IVFs, IV Albumin. HRs were in the 30-50s on Tele with 2.5 sec pauses. His beta-blocker was DCd. Hydralazine, Isosorbide were held but resumed due to elevated BP. His QT was prolonged in the setting of low K+. His hsTrops were mildly elevated w/o trend (67-68) and felt to be due to demand ischemia. Med ?'s: Metoprolol DCd, hydrochlorothiazide DCd, Torsemide ? to 20 mg daily, Farxiga DCd. Notes indicate that he may need to tolerate a higher BP b/c of frailty. He had some falls prior to DC. He was DC to SNF.   He is here with his daughter.  Over the past couple months, he has been doing well.  He has not had chest discomfort or significant shortness of breath.  He has not had orthopnea, syncope.  He takes an occasional  extra torsemide for increased swelling or weight.  He has not had to take any extra for the past week.  His pressures at home have been ranging 130-160 (systolic).  He did call EMS out to his house last week.  He had a hypoglycemic episode with sugar in the 30s.  He felt better with sugar replacement.  EKG was done.  I reviewed this and it demonstrated sinus rhythm.  There were no acute acute ST-T wave changes.  Review of Systems  Gastrointestinal:  Negative for hematochezia and melena.  Genitourinary:  Negative for hematuria.   See the HPI    Studies Reviewed:        Risk Assessment/Calculations:    CHA2DS2-VASc Score = 8   This indicates a 10.8% annual risk of stroke. The patient's score is based upon: CHF History: 1 HTN History: 1 Diabetes History: 1 Stroke History: 2 Vascular Disease History: 1 Age Score: 2 Gender Score: 0    HYPERTENSION CONTROL Vitals:   04/10/23 1426 04/10/23 1501  BP: (!) 160/62 (!) 150/64    The patient's blood pressure is elevated above target today.  In order to address the patient's elevated BP: Blood pressure will be monitored at home to determine if medication changes need to be made.          Physical Exam:   VS:  BP (!) 150/64   Pulse 74  Ht 5\' 7"  (1.702 m)   Wt 189 lb (85.7 kg)   SpO2 97%   BMI 29.60 kg/m    Wt Readings from Last 3 Encounters:  04/10/23 189 lb (85.7 kg)  03/05/23 184 lb 4 oz (83.6 kg)  02/01/23 180 lb (81.6 kg)    Constitutional:      Appearance: Healthy appearance. Not in distress.  Neck:     Vascular: No JVR. JVD normal.  Pulmonary:     Breath sounds: Normal breath sounds. No wheezing. No rales.  Cardiovascular:     Normal rate. Regular rhythm.     Murmurs: There is no murmur.  Edema:    Peripheral edema absent.  Abdominal:     Palpations: Abdomen is soft.       ASSESSMENT AND PLAN:   (HFpEF) heart failure with preserved ejection fraction (HCC) EF 60-65 by echocardiogram April 2024.  NYHA II-IIb.   The volume status appears stable.  He does take an occasional extra torsemide as needed.  He was taken off of SGLT2 inhibitor in the hospital due to acute kidney injury.  I would be hesitant to place him back on this.  Continue torsemide 20 mg daily.  Follow-up 3 to 4 months.  CAD (coronary artery disease) History of CABG in 2020.  He is doing well without chest discomfort to suggest angina.  His elevated enzymes in the hospital recently were likely due to to demand ischemia in the setting of chronic kidney disease.  He does not require ischemic testing.  He is not on antiplatelet therapy as he is on Eliquis.  Continue amlodipine 5 mg twice daily, Imdur 60 mg daily, Crestor 20 mg daily.  Follow-up 3 to 4 months.  Paroxysmal atrial fibrillation (HCC) The electrocardiogram that was performed by EMS when he had his hypoglycemic episode continue to demonstrate sinus rhythm.  He was taken off of beta-blocker therapy in the hospital secondary to bradycardia.  He has not had any symptoms of syncope or near syncope.  He is 82 years old and his creatinine has been above 1.5.  Continue Eliquis 2.5 mg twice daily.  Follow-up 3 to 4 months.  Essential hypertension Blood pressure is above target.  It was recommended to accept a higher blood pressure given his frail status and issues with AKI and falls.  Continue amlodipine 5 mg twice daily, hydralazine 100 mg 3 times a day, Imdur 60 mg daily, torsemide 20 mg daily.  Monitor blood pressure over the next week.  He will send me readings for review via MyChart.  Based upon his history as outlined, I would suggest trying to keep his blood pressure <150/90.  If his blood pressure is above target, consider increasing isosorbide to 90 mg daily.  CKD (chronic kidney disease) He is followed by nephrology.     Dispo:  Return in about 4 months (around 08/10/2023) for Routine Follow Up with Dr. Clifton James, or Tereso Newcomer, PA-C.  Signed, Tereso Newcomer, PA-C

## 2023-04-10 NOTE — Assessment & Plan Note (Signed)
History of CABG in 2020.  He is doing well without chest discomfort to suggest angina.  His elevated enzymes in the hospital recently were likely due to to demand ischemia in the setting of chronic kidney disease.  He does not require ischemic testing.  He is not on antiplatelet therapy as he is on Eliquis.  Continue amlodipine 5 mg twice daily, Imdur 60 mg daily, Crestor 20 mg daily.  Follow-up 3 to 4 months.

## 2023-04-10 NOTE — Patient Instructions (Signed)
Medication Instructions:  Your physician recommends that you continue on your current medications as directed. Please refer to the Current Medication list given to you today.  *If you need a refill on your cardiac medications before your next appointment, please call your pharmacy*   Lab Work: None ordered  If you have labs (blood work) drawn today and your tests are completely normal, you will receive your results only by: MyChart Message (if you have MyChart) OR A paper copy in the mail If you have any lab test that is abnormal or we need to change your treatment, we will call you to review the results.   Testing/Procedures: None ordered   Follow-Up: At [facility], you and your health needs are our priority.  As part of our continuing mission to provide you with exceptional heart care, we have created designated Provider Care Teams.  These Care Teams include your primary Cardiologist (physician) and Advanced Practice Providers (APPs -  Physician Assistants and Nurse Practitioners) who all work together to provide you with the care you need, when you need it.  We recommend signing up for the patient portal called "MyChart".  Sign up information is provided on this After Visit Summary.  MyChart is used to connect with patients for Virtual Visits (Telemedicine).  Patients are able to view lab/test results, encounter notes, upcoming appointments, etc.  Non-urgent messages can be sent to your provider as well.   To learn more about what you can do with MyChart, go to [email or web address].    Your next appointment:   3 month(s)  Provider:   Verne Carrow, MD  or Tereso Newcomer, PA-C         Other Instructions Your physician has requested that you regularly monitor and record your blood pressure readings at home. Please use the same machine at the same time of day to check your readings and record them to bring to your follow-up visit.   Please monitor blood pressures  and keep a log of your readings for 1 week then send the readings via mychart.    Make sure to check 2 hours after your medications.    AVOID these things for 30 minutes before checking your blood pressure: No Drinking caffeine. No Drinking alcohol. No Eating. No Smoking. No Exercising.   Five minutes before checking your blood pressure: Pee. Sit in a dining chair. Avoid sitting in a soft couch or armchair. Be quiet. Do not talk

## 2023-04-10 NOTE — Assessment & Plan Note (Signed)
Blood pressure is above target.  It was recommended to accept a higher blood pressure given his frail status and issues with AKI and falls.  Continue amlodipine 5 mg twice daily, hydralazine 100 mg 3 times a day, Imdur 60 mg daily, torsemide 20 mg daily.  Monitor blood pressure over the next week.  He will send me readings for review via MyChart.  Based upon his history as outlined, I would suggest trying to keep his blood pressure <150/90.  If his blood pressure is above target, consider increasing isosorbide to 90 mg daily.

## 2023-04-10 NOTE — Assessment & Plan Note (Signed)
EF 60-65 by echocardiogram April 2024.  NYHA II-IIb.  The volume status appears stable.  He does take an occasional extra torsemide as needed.  He was taken off of SGLT2 inhibitor in the hospital due to acute kidney injury.  I would be hesitant to place him back on this.  Continue torsemide 20 mg daily.  Follow-up 3 to 4 months.

## 2023-04-10 NOTE — Assessment & Plan Note (Signed)
He is followed by nephrology. °

## 2023-04-13 ENCOUNTER — Ambulatory Visit: Payer: Self-pay

## 2023-04-13 ENCOUNTER — Telehealth: Payer: Self-pay

## 2023-04-13 NOTE — Patient Instructions (Signed)
Visit Information  Thank you for taking time to visit with me today. Please don't hesitate to contact me if I can be of assistance to you.   Following are the goals we discussed today:   Goals Addressed             This Visit's Progress    Patient stated: decrease blood pressure and post hospital/SNF discharge follow up       Interventions Today    Flowsheet Row Most Recent Value  Chronic Disease   Chronic disease during today's visit Diabetes, Congestive Heart Failure (CHF), Hypertension (HTN)  General Interventions   General Interventions Discussed/Reviewed General Interventions Reviewed, Doctor Visits  [Evaluation for treatment plan related to DM,  HF, HTN, and patients adherence to plan as established  by provider. Assessed for blood sugars, HF symptoms, BP readings.]  Doctor Visits Discussed/Reviewed Doctor Visits Reviewed  Jake Gross upcoming provider visits.]  Education Interventions   Education Provided Provided Education  [reviewed hypoglycemic treatment.]  Provided Verbal Education On Blood Sugar Monitoring  [Advised to continue blood sugar, blood pressure, O2 sat and pulse monitoring. Advised to notify provider for readings outside of established parameters.  Advised to notify provider of frequent low blood sugars.]  Nutrition Interventions   Nutrition Discussed/Reviewed Nutrition Reviewed  [Advised to eat healthy snack before bed.  Discussed examples of healthy bedtime snacks for diabetics.]  Pharmacy Interventions   Pharmacy Dicussed/Reviewed Pharmacy Topics Reviewed  [reviewed medications and discussed compliance.]                  Our next appointment is by telephone on 05/11/23 at 11 am  Please call the care guide team at [phone] if you need to cancel or reschedule your appointment.   If you are experiencing a Mental Health or Behavioral Health Crisis or need someone to talk to, please call the Suicide and Crisis Lifeline: 988 call 1-800-273-TALK (toll  free, 24 hour hotline)  Patient verbalizes understanding of instructions and care plan provided today and agrees to view in MyChart. Active MyChart status and patient understanding of how to access instructions and care plan via MyChart confirmed with patient.     Jake Ina RN,BSN,CCM [facility] Care Coordination [phone] direct line  Hypoglycemia Hypoglycemia is when the sugar (glucose) level in your blood is too low. Low blood sugar can happen to people who have diabetes and people who do not have diabetes. Low blood sugar can happen quickly, and it can be an emergency. What are the causes? This condition happens most often in people who have diabetes. It may be caused by: Diabetes medicine. Not eating enough, or not eating often enough. Doing more physical activity. Drinking alcohol on an empty stomach. If you do not have diabetes, this condition may be caused by: A tumor in the pancreas. Not eating enough, or not eating for long periods at a time (fasting). A very bad infection or illness. Problems after having weight loss (bariatric) surgery. Kidney failure or liver failure. Certain medicines. What increases the risk? This condition is more likely to develop in people who: Have diabetes and take medicines to lower their blood sugar. Abuse alcohol. Have a very bad illness. What are the signs or symptoms? Mild Hunger. Sweating and feeling clammy. Feeling dizzy or light-headed. Being sleepy or having trouble sleeping. Feeling like you may vomit (nauseous). A fast heartbeat. A headache. Blurry vision. Mood changes, such as: Being grouchy. Feeling worried or nervous (anxious). Tingling or loss of feeling (numbness) around your  mouth, lips, or tongue. Moderate Confusion and poor judgment. Behavior changes. Weakness. Uneven heartbeat. Trouble with moving (coordination). Very low Very low blood sugar (severe hypoglycemia) is a medical emergency. It can  cause: Fainting. Seizures. Loss of consciousness (coma). Death. How is this treated? Treating low blood sugar Low blood sugar is often treated by eating or drinking something that has sugar in it right away. The food or drink should contain 15 grams of a fast-acting carb (carbohydrate). Options include: 4 oz (120 mL) of fruit juice. 4 oz (120 mL) of regular soda (not diet soda). A few pieces of hard candy. Check food labels to see how many pieces to eat for 15 grams. 1 Tbsp (15 mL) of sugar or honey. 4 glucose tablets. 1 tube of glucose gel. Treating low blood sugar if you have diabetes If you can think clearly and swallow safely, follow the 15:15 rule: Take 15 grams of a fast-acting carb. Talk with your doctor about how much you should take. Always keep a source of fast-acting carb with you, such as: Glucose tablets (take 4 tablets). A few pieces of hard candy. Check food labels to see how many pieces to eat for 15 grams. 4 oz (120 mL) of fruit juice. 4 oz (120 mL) of regular soda (not diet soda). 1 Tbsp (15 mL) of honey or sugar. 1 tube of glucose gel. Check your blood sugar 15 minutes after you take the carb. If your blood sugar is still at or below 70 mg/dL (3.9 mmol/L), take 15 grams of a carb again. If your blood sugar does not go above 70 mg/dL (3.9 mmol/L) after 3 tries, get help right away. After your blood sugar goes back to normal, eat a meal or a snack within 1 hour.  Treating very low blood sugar If your blood sugar is below 54 mg/dL (3 mmol/L), you have very low blood sugar, or severe hypoglycemia. This is an emergency. Get medical help right away. If you have very low blood sugar and you cannot eat or drink, you will need to be given a hormone called glucagon. A family member or friend should learn how to check your blood sugar and how to give you glucagon. Ask your doctor if you need to have an emergency glucagon kit at home. Very low blood sugar may also need to be  treated in a hospital. Follow these instructions at home: General instructions Take over-the-counter and prescription medicines only as told by your doctor. Stay aware of your blood sugar as told by your doctor. If you drink alcohol: Limit how much you have to: 0-1 drink a day for women who are not pregnant. 0-2 drinks a day for men. Know how much alcohol is in your drink. In the [place] one drink equals one 12 oz bottle of beer (355 mL), one 5 oz glass of wine (148 mL), or one 1 oz glass of hard liquor (44 mL). Be sure to eat food when you drink alcohol. Know that your body absorbs alcohol quickly. This may lead to low blood sugar later. Be sure to keep checking your blood sugar. Keep all follow-up visits. If you have diabetes:  Always have a fast-acting carb (15 grams) with you to treat low blood sugar. Follow your diabetes care plan as told by your doctor. Make sure you: Know the symptoms of low blood sugar. Check your blood sugar as often as told. Always check it before and after exercise. Always check your blood sugar before you drive.  Take your medicines as told. Follow your meal plan. Eat on time. Do not skip meals. Share your diabetes care plan with: Your work or school. People you live with. Carry a card or wear jewelry that says you have diabetes. Where to find more information American Diabetes Association: www.diabetes.org Contact a doctor if: You have trouble keeping your blood sugar in your target range. You have low blood sugar often. Get help right away if: You still have symptoms after you eat or drink something that contains 15 grams of fast-acting carb, and you cannot get your blood sugar above 70 mg/dL by following the 40:98 rule. Your blood sugar is below 54 mg/dL (3 mmol/L). You have a seizure. You faint. These symptoms may be an emergency. Get help right away. Call your local emergency services (911 in the [place] Do not wait to see if the symptoms will go  away. Do not drive yourself to the hospital. Summary Hypoglycemia happens when the level of sugar (glucose) in your blood is too low. Low blood sugar can happen to people who have diabetes and people who do not have diabetes. Low blood sugar can happen quickly, and it can be an emergency. Make sure you know the symptoms of low blood sugar and know how to treat it. Always keep a source of sugar (fast-acting carb) with you to treat low blood sugar. This information is not intended to replace advice given to you by your health care provider. Make sure you discuss any questions you have with your health care provider. Document Revised: 09/02/2020 Document Reviewed: 09/02/2020 Elsevier Patient Education  2024 [employer].

## 2023-04-13 NOTE — Telephone Encounter (Signed)
This was duplicate fax have filled out form will fax.  No further action needed at this time.

## 2023-04-13 NOTE — Telephone Encounter (Signed)
Received order request placed in your box for review.

## 2023-04-13 NOTE — Patient Outreach (Signed)
  Care Coordination   Follow Up Visit Note   04/13/2023 Name: Jake Gross MRN: [id number] DOB: 1940/12/02  Jake Gross is a 82 y.o. year old male who sees Cable, Genene Churn, NP for primary care. I spoke with  [name] by phone today.  What matters to the patients health and wellness today?   DM: Patient reports having hypoglycemic episode of 30 last week. He states he called EMS and was treated.  Patient states he reported this at his cardiology visit on 04/09/23. Patient reports fasting blood sugars are ranging from 101 to 107.  HTN: Patient reports blood pressure reading today was was 169/72 and yesterday was 148/71.  Verbalizes compliance with medications HF: Patient denies any heart failure symptoms. Verbalizes compliance with medications. Patient reports O2 sat was 97% and pulse 64.    Goals Addressed             This Visit's Progress    Patient stated: decrease blood pressure and post hospital/SNF discharge follow up       Interventions Today    Flowsheet Row Most Recent Value  Chronic Disease   Chronic disease during today's visit Diabetes, Congestive Heart Failure (CHF), Hypertension (HTN)  General Interventions   General Interventions Discussed/Reviewed General Interventions Reviewed, Doctor Visits  [Evaluation for treatment plan related to DM,  HF, HTN, and patients adherence to plan as established  by provider. Assessed for blood sugars, HF symptoms, BP readings.]  Doctor Visits Discussed/Reviewed Doctor Visits Reviewed  [name] upcoming provider visits.]  Education Interventions   Education Provided Provided Education  [reviewed hypoglycemic treatment.]  Provided Verbal Education On Blood Sugar Monitoring  [Advised to continue blood sugar, blood pressure, O2 sat and pulse monitoring. Advised to notify provider for readings outside of established parameters.  Advised to notify provider of frequent low blood sugars.]  Nutrition Interventions   Nutrition  Discussed/Reviewed Nutrition Reviewed  [Advised to eat healthy snack before bed.  Discussed examples of healthy bedtime snacks for diabetics.]  Pharmacy Interventions   Pharmacy Dicussed/Reviewed Pharmacy Topics Reviewed  [reviewed medications and discussed compliance.]                  SDOH assessments and interventions completed:  No     Care Coordination Interventions:  Yes, provided   Follow up plan: Follow up call scheduled for 05/11/23    Encounter Outcome:  Pt. Visit Completed   [name] RN,BSN,CCM [facility] Care Coordination [phone] direct line

## 2023-04-17 DIAGNOSIS — E1122 Type 2 diabetes mellitus with diabetic chronic kidney disease: Secondary | ICD-10-CM | POA: Diagnosis not present

## 2023-04-17 DIAGNOSIS — I5032 Chronic diastolic (congestive) heart failure: Secondary | ICD-10-CM | POA: Diagnosis not present

## 2023-04-17 DIAGNOSIS — N184 Chronic kidney disease, stage 4 (severe): Secondary | ICD-10-CM | POA: Diagnosis not present

## 2023-04-17 DIAGNOSIS — I480 Paroxysmal atrial fibrillation: Secondary | ICD-10-CM | POA: Diagnosis not present

## 2023-04-17 DIAGNOSIS — K7460 Unspecified cirrhosis of liver: Secondary | ICD-10-CM | POA: Diagnosis not present

## 2023-04-17 DIAGNOSIS — I130 Hypertensive heart and chronic kidney disease with heart failure and stage 1 through stage 4 chronic kidney disease, or unspecified chronic kidney disease: Secondary | ICD-10-CM | POA: Diagnosis not present

## 2023-04-18 ENCOUNTER — Other Ambulatory Visit (HOSPITAL_COMMUNITY): Payer: Self-pay

## 2023-04-20 ENCOUNTER — Telehealth: Payer: Self-pay | Admitting: Nurse Practitioner

## 2023-04-20 DIAGNOSIS — I130 Hypertensive heart and chronic kidney disease with heart failure and stage 1 through stage 4 chronic kidney disease, or unspecified chronic kidney disease: Secondary | ICD-10-CM | POA: Diagnosis not present

## 2023-04-20 DIAGNOSIS — E1122 Type 2 diabetes mellitus with diabetic chronic kidney disease: Secondary | ICD-10-CM | POA: Diagnosis not present

## 2023-04-20 DIAGNOSIS — I5032 Chronic diastolic (congestive) heart failure: Secondary | ICD-10-CM | POA: Diagnosis not present

## 2023-04-20 DIAGNOSIS — I480 Paroxysmal atrial fibrillation: Secondary | ICD-10-CM | POA: Diagnosis not present

## 2023-04-20 DIAGNOSIS — K7460 Unspecified cirrhosis of liver: Secondary | ICD-10-CM | POA: Diagnosis not present

## 2023-04-20 DIAGNOSIS — N184 Chronic kidney disease, stage 4 (severe): Secondary | ICD-10-CM | POA: Diagnosis not present

## 2023-04-20 NOTE — Telephone Encounter (Signed)
Patient's [facility] nurse contacted the office regarding this patient's PA sent for freestyle libre. Requested an update, advised that everything from our end seemed to be complete but we may be waiting on response. Would like an update whenever possible with approval or denial.

## 2023-04-20 NOTE — Telephone Encounter (Signed)
Prescription Request  04/20/2023  LOV: 03/05/2023  What is the name of the medication or equipment? glucose blood (ONETOUCH VERIO) test strip     Have you contacted your pharmacy to request a refill? No   Which pharmacy would you like this sent to?  [id number] [place], [place] - [address] [place] [address] Phone: [phone] Fax: [phone]    Patient notified that their request is being sent to the clinical staff for review and that they should receive a response within 2 business days.   Please advise at Mobile [phone] (mobile)  Patient's [facility] nurse called for refill on these. Patient is waiting on PA for freestyle libre sensor and needs a refill of strips in meantime. Please advise, thank you.

## 2023-04-23 ENCOUNTER — Other Ambulatory Visit: Payer: Self-pay | Admitting: Nurse Practitioner

## 2023-04-23 ENCOUNTER — Telehealth: Payer: Self-pay | Admitting: Nurse Practitioner

## 2023-04-23 DIAGNOSIS — I130 Hypertensive heart and chronic kidney disease with heart failure and stage 1 through stage 4 chronic kidney disease, or unspecified chronic kidney disease: Secondary | ICD-10-CM | POA: Diagnosis not present

## 2023-04-23 DIAGNOSIS — I5032 Chronic diastolic (congestive) heart failure: Secondary | ICD-10-CM | POA: Diagnosis not present

## 2023-04-23 DIAGNOSIS — I480 Paroxysmal atrial fibrillation: Secondary | ICD-10-CM | POA: Diagnosis not present

## 2023-04-23 DIAGNOSIS — E1122 Type 2 diabetes mellitus with diabetic chronic kidney disease: Secondary | ICD-10-CM | POA: Diagnosis not present

## 2023-04-23 DIAGNOSIS — N184 Chronic kidney disease, stage 4 (severe): Secondary | ICD-10-CM | POA: Diagnosis not present

## 2023-04-23 DIAGNOSIS — K7460 Unspecified cirrhosis of liver: Secondary | ICD-10-CM | POA: Diagnosis not present

## 2023-04-23 MED ORDER — GLUCOSE BLOOD VI STRP: 1.0000 | ORAL_STRIP | Freq: Three times a day (TID) | 6 refills | Status: DC | PRN

## 2023-04-23 NOTE — Telephone Encounter (Signed)
Refill provided

## 2023-04-23 NOTE — Telephone Encounter (Signed)
Home Health verbal orders Caller Name: Amy  Agency Name: [facility] number: [phone]  Requesting OT/PT/Skilled nursing/Social Work/Speech: Skilled Nursing extension   Reason: Diabetic HTN CHF management   Frequency: 1wk 9   Please forward to [facility] pool or providers CMA

## 2023-04-24 DIAGNOSIS — Z79899 Other long term (current) drug therapy: Secondary | ICD-10-CM

## 2023-04-24 NOTE — Telephone Encounter (Signed)
Verbal orders ok to give.

## 2023-04-24 NOTE — Telephone Encounter (Signed)
Let's change his Torsemide to 20 mg twice daily on Mon and Thursday and take 20 mg once daily on all other days. Check BP in PM. If BP 150/90 or higher, take extra Isosorbide 30 mg. BMET in 10 days Tereso Newcomer, [place]    04/24/2023 4:52 PM

## 2023-04-24 NOTE — Telephone Encounter (Signed)
Called and gave verbal order to Amy no further questions at this time.

## 2023-04-25 DIAGNOSIS — K7460 Unspecified cirrhosis of liver: Secondary | ICD-10-CM | POA: Diagnosis not present

## 2023-04-25 DIAGNOSIS — I130 Hypertensive heart and chronic kidney disease with heart failure and stage 1 through stage 4 chronic kidney disease, or unspecified chronic kidney disease: Secondary | ICD-10-CM | POA: Diagnosis not present

## 2023-04-25 DIAGNOSIS — I480 Paroxysmal atrial fibrillation: Secondary | ICD-10-CM | POA: Diagnosis not present

## 2023-04-25 DIAGNOSIS — E1122 Type 2 diabetes mellitus with diabetic chronic kidney disease: Secondary | ICD-10-CM | POA: Diagnosis not present

## 2023-04-25 DIAGNOSIS — N184 Chronic kidney disease, stage 4 (severe): Secondary | ICD-10-CM | POA: Diagnosis not present

## 2023-04-25 DIAGNOSIS — I5032 Chronic diastolic (congestive) heart failure: Secondary | ICD-10-CM | POA: Diagnosis not present

## 2023-04-25 MED ORDER — TORSEMIDE 20 MG PO TABS: ORAL_TABLET | ORAL | 3 refills | Status: DC

## 2023-04-25 MED ORDER — ISOSORBIDE MONONITRATE ER 60 MG PO TB24: ORAL_TABLET | ORAL | 3 refills | Status: DC

## 2023-04-27 ENCOUNTER — Other Ambulatory Visit (HOSPITAL_COMMUNITY): Payer: Self-pay

## 2023-04-27 DIAGNOSIS — D509 Iron deficiency anemia, unspecified: Secondary | ICD-10-CM | POA: Diagnosis not present

## 2023-04-27 DIAGNOSIS — Z7901 Long term (current) use of anticoagulants: Secondary | ICD-10-CM | POA: Diagnosis not present

## 2023-04-27 DIAGNOSIS — I2510 Atherosclerotic heart disease of native coronary artery without angina pectoris: Secondary | ICD-10-CM | POA: Diagnosis not present

## 2023-04-27 DIAGNOSIS — I5032 Chronic diastolic (congestive) heart failure: Secondary | ICD-10-CM | POA: Diagnosis not present

## 2023-04-27 DIAGNOSIS — I480 Paroxysmal atrial fibrillation: Secondary | ICD-10-CM | POA: Diagnosis not present

## 2023-04-27 DIAGNOSIS — E1122 Type 2 diabetes mellitus with diabetic chronic kidney disease: Secondary | ICD-10-CM | POA: Diagnosis not present

## 2023-04-27 DIAGNOSIS — K7460 Unspecified cirrhosis of liver: Secondary | ICD-10-CM | POA: Diagnosis not present

## 2023-04-27 DIAGNOSIS — Z87891 Personal history of nicotine dependence: Secondary | ICD-10-CM | POA: Diagnosis not present

## 2023-04-27 DIAGNOSIS — Z8673 Personal history of transient ischemic attack (TIA), and cerebral infarction without residual deficits: Secondary | ICD-10-CM | POA: Diagnosis not present

## 2023-04-27 DIAGNOSIS — E785 Hyperlipidemia, unspecified: Secondary | ICD-10-CM | POA: Diagnosis not present

## 2023-04-27 DIAGNOSIS — Z794 Long term (current) use of insulin: Secondary | ICD-10-CM | POA: Diagnosis not present

## 2023-04-27 DIAGNOSIS — N184 Chronic kidney disease, stage 4 (severe): Secondary | ICD-10-CM | POA: Diagnosis not present

## 2023-04-27 DIAGNOSIS — I130 Hypertensive heart and chronic kidney disease with heart failure and stage 1 through stage 4 chronic kidney disease, or unspecified chronic kidney disease: Secondary | ICD-10-CM | POA: Diagnosis not present

## 2023-04-27 DIAGNOSIS — Z9181 History of falling: Secondary | ICD-10-CM | POA: Diagnosis not present

## 2023-04-27 NOTE — Telephone Encounter (Signed)
This request is already being handled in separate encounter. Please sign off on encounter as PA team is unable to resolve RX requests. Thank you

## 2023-05-01 ENCOUNTER — Telehealth: Payer: Self-pay | Admitting: Nurse Practitioner

## 2023-05-01 DIAGNOSIS — N184 Chronic kidney disease, stage 4 (severe): Secondary | ICD-10-CM | POA: Diagnosis not present

## 2023-05-01 DIAGNOSIS — I130 Hypertensive heart and chronic kidney disease with heart failure and stage 1 through stage 4 chronic kidney disease, or unspecified chronic kidney disease: Secondary | ICD-10-CM | POA: Diagnosis not present

## 2023-05-01 DIAGNOSIS — I5032 Chronic diastolic (congestive) heart failure: Secondary | ICD-10-CM | POA: Diagnosis not present

## 2023-05-01 DIAGNOSIS — I480 Paroxysmal atrial fibrillation: Secondary | ICD-10-CM | POA: Diagnosis not present

## 2023-05-01 DIAGNOSIS — E1122 Type 2 diabetes mellitus with diabetic chronic kidney disease: Secondary | ICD-10-CM | POA: Diagnosis not present

## 2023-05-01 DIAGNOSIS — Z794 Long term (current) use of insulin: Secondary | ICD-10-CM | POA: Diagnosis not present

## 2023-05-01 NOTE — Telephone Encounter (Signed)
Called and spoke to [name]. Reviewed all information. States that she will let patient know and keep up informed with his numbers. Will call if any further questions.

## 2023-05-01 NOTE — Telephone Encounter (Signed)
Patients A1C in office was 6.0%. if there is a concern for recurrent low glucose readings then he can titrate down 5 untis to 50 units.

## 2023-05-01 NOTE — Telephone Encounter (Signed)
[name] from [facility] called in and stated that patient has a new freestyle libre. She stated that his readings have been in as low as 58 and as high as 150 and he is asymptomatic. She stated that right now his reading is at 44 and that is without taking insulin and not eating breakfast yet. She was wondering if his insulin should be tweaked because he is currently taking 55 units, and his numbers are up and down throughout the day. Thank you!

## 2023-05-03 ENCOUNTER — Encounter: Payer: Self-pay | Admitting: Pharmacist

## 2023-05-03 NOTE — Progress Notes (Signed)
Patient previously followed by [facility] pharmacist. Per clinical review, no pharmacist appointment needed at this time. Followed by [facility]. Please refer to pharmacy for any future needs.

## 2023-05-04 ENCOUNTER — Ambulatory Visit: Payer: Medicare Other | Attending: Physician Assistant

## 2023-05-04 DIAGNOSIS — Z79899 Other long term (current) drug therapy: Secondary | ICD-10-CM

## 2023-05-05 LAB — BASIC METABOLIC PANEL
BUN/Creatinine Ratio: 12 (ref 10–24)
BUN: 21 mg/dL (ref 8–27)
CO2: 20 mmol/L (ref 20–29)
Calcium: 8.6 mg/dL (ref 8.6–10.2)
Chloride: 105 mmol/L (ref 96–106)
Creatinine, Ser: 1.69 mg/dL — ABNORMAL HIGH (ref 0.76–1.27)
Glucose: 109 mg/dL — ABNORMAL HIGH (ref 70–99)
Potassium: 3.6 mmol/L (ref 3.5–5.2)
Sodium: 140 mmol/L (ref 134–144)
eGFR: 41 mL/min/{1.73_m2} — ABNORMAL LOW (ref >59)

## 2023-05-06 DIAGNOSIS — Z794 Long term (current) use of insulin: Secondary | ICD-10-CM | POA: Diagnosis not present

## 2023-05-06 DIAGNOSIS — I130 Hypertensive heart and chronic kidney disease with heart failure and stage 1 through stage 4 chronic kidney disease, or unspecified chronic kidney disease: Secondary | ICD-10-CM | POA: Diagnosis not present

## 2023-05-06 DIAGNOSIS — E1122 Type 2 diabetes mellitus with diabetic chronic kidney disease: Secondary | ICD-10-CM | POA: Diagnosis not present

## 2023-05-06 DIAGNOSIS — I480 Paroxysmal atrial fibrillation: Secondary | ICD-10-CM | POA: Diagnosis not present

## 2023-05-06 DIAGNOSIS — I5032 Chronic diastolic (congestive) heart failure: Secondary | ICD-10-CM | POA: Diagnosis not present

## 2023-05-06 DIAGNOSIS — N184 Chronic kidney disease, stage 4 (severe): Secondary | ICD-10-CM | POA: Diagnosis not present

## 2023-05-09 ENCOUNTER — Encounter: Payer: Medicare Other | Admitting: Pharmacist

## 2023-05-11 ENCOUNTER — Ambulatory Visit: Payer: Self-pay

## 2023-05-11 NOTE — Patient Outreach (Signed)
  Care Coordination   Follow Up Visit Note   05/11/2023 Name: Jake Gross MRN: [id number] DOB: May 20, 1941  Jake Gross is a 82 y.o. year old male who sees Cable, Genene Churn, NP for primary care. I spoke with  [name] by phone today.  What matters to the patients health and wellness today?  Patient states he is doing ok.  Denies heart failure symptoms. Patient reports blood pressures ranging in the 160's/60-70's.   Reports today's blood pressure was 163/64.  Patient states he understands doctor advised him to take an extra 1/2 dose of Isorsobide if BP 150/90 or greater. Patient reports 1 hypoglycemic episode of 54 in the past 2 weeks.  Patient states low blood sugar usually occurs at night. He states he eat 2 peanut butter crackers at night for snack.   Patient reports this morning fasting BS was 169.  Patient reports oxygen level is in the 90's and pulse in the 70's. Per chart review next follow up visit with PCP is 06/11/23.    Goals Addressed             This Visit's Progress    Patient stated: decrease blood pressure and post hospital/SNF discharge follow up       Interventions Today    Flowsheet Row Most Recent Value  Chronic Disease   Chronic disease during today's visit Diabetes, Congestive Heart Failure (CHF), Hypertension (HTN)  General Interventions   General Interventions Discussed/Reviewed General Interventions Reviewed, Doctor Visits  [evaluation of current treatment plan for DM, HTN, HF and patients adherence to plan as established by provider.  Assessed for BS, BP, and heart failure symptoms.]  Doctor Visits Discussed/Reviewed Doctor Visits Reviewed  [name] upcoming provider visits.]  Education Interventions   Education Provided Provided Education  [reviewed heart failure symptoms.  Advised to continue to weigh daily and check BP, O2 level/ pulse]  Provided Verbal Education On Blood Sugar Monitoring, Nutrition  [Inquired if patient eating healthy snack  before bed.]  Nutrition Interventions   Nutrition Discussed/Reviewed Nutrition Reviewed  Pharmacy Interventions   Pharmacy Dicussed/Reviewed Pharmacy Topics Reviewed  [medications reviewed and compliance discussed. Advised to notify provider if blood pressure not responding to extra 1/2 dose of isorsobide. Confirmed patient taking extra 1/2 dose of isorsobide if BP >150/90 as recommended by provider.]                  SDOH assessments and interventions completed:  No     Care Coordination Interventions:  Yes, provided   Follow up plan: Follow up call scheduled for 06/15/23    Encounter Outcome:  Pt. Visit Completed   [name] RN,BSN,CCM [facility] Care Coordination [phone] direct line

## 2023-05-11 NOTE — Patient Instructions (Signed)
Visit Information  Thank you for taking time to visit with me today. Please don't hesitate to contact me if I can be of assistance to you.   Following are the goals we discussed today:   Goals Addressed             This Visit's Progress    Patient stated: decrease blood pressure and post hospital/SNF discharge follow up       Interventions Today    Flowsheet Row Most Recent Value  Chronic Disease   Chronic disease during today's visit Diabetes, Congestive Heart Failure (CHF), Hypertension (HTN)  General Interventions   General Interventions Discussed/Reviewed General Interventions Reviewed, Doctor Visits  [evaluation of current treatment plan for DM, HTN, HF and patients adherence to plan as established by provider.  Assessed for BS, BP, and heart failure symptoms.]  Doctor Visits Discussed/Reviewed Doctor Visits Reviewed  [name] upcoming provider visits.]  Education Interventions   Education Provided Provided Education  [reviewed heart failure symptoms.  Advised to continue to weigh daily and check BP, O2 level/ pulse]  Provided Verbal Education On Blood Sugar Monitoring, Nutrition  [Inquired if patient eating healthy snack before bed.]  Nutrition Interventions   Nutrition Discussed/Reviewed Nutrition Reviewed  Pharmacy Interventions   Pharmacy Dicussed/Reviewed Pharmacy Topics Reviewed  [medications reviewed and compliance discussed. Advised to notify provider if blood pressure not responding to extra 1/2 dose of isorsobide. Confirmed patient taking extra 1/2 dose of isorsobide if BP >150/90 as recommended by provider.]                  Our next appointment is by telephone on 06/15/23 at 10:30 am  Please call the care guide team at [phone] if you need to cancel or reschedule your appointment.   If you are experiencing a Mental Health or Behavioral Health Crisis or need someone to talk to, please call the Suicide and Crisis Lifeline: 988 call 1-800-273-TALK (toll  free, 24 hour hotline)  Patient verbalizes understanding of instructions and care plan provided today and agrees to view in MyChart. Active MyChart status and patient understanding of how to access instructions and care plan via MyChart confirmed with patient.     [name] RN,BSN,CCM [facility] Care Coordination [phone] direct line    .

## 2023-05-16 ENCOUNTER — Encounter (INDEPENDENT_AMBULATORY_CARE_PROVIDER_SITE_OTHER): Payer: Self-pay

## 2023-05-16 DIAGNOSIS — Z794 Long term (current) use of insulin: Secondary | ICD-10-CM | POA: Diagnosis not present

## 2023-05-16 DIAGNOSIS — N184 Chronic kidney disease, stage 4 (severe): Secondary | ICD-10-CM | POA: Diagnosis not present

## 2023-05-16 DIAGNOSIS — I480 Paroxysmal atrial fibrillation: Secondary | ICD-10-CM | POA: Diagnosis not present

## 2023-05-16 DIAGNOSIS — E1122 Type 2 diabetes mellitus with diabetic chronic kidney disease: Secondary | ICD-10-CM | POA: Diagnosis not present

## 2023-05-16 DIAGNOSIS — I130 Hypertensive heart and chronic kidney disease with heart failure and stage 1 through stage 4 chronic kidney disease, or unspecified chronic kidney disease: Secondary | ICD-10-CM | POA: Diagnosis not present

## 2023-05-16 DIAGNOSIS — I5032 Chronic diastolic (congestive) heart failure: Secondary | ICD-10-CM | POA: Diagnosis not present

## 2023-05-17 ENCOUNTER — Telehealth: Payer: Self-pay | Admitting: Nurse Practitioner

## 2023-05-17 DIAGNOSIS — E119 Type 2 diabetes mellitus without complications: Secondary | ICD-10-CM

## 2023-05-17 MED ORDER — INSULIN GLARGINE-YFGN 100 UNIT/ML ~~LOC~~ SOPN
55.0000 [IU] | PEN_INJECTOR | Freq: Every day | SUBCUTANEOUS | 0 refills | Status: DC
Start: 2023-05-17 — End: 2023-06-11

## 2023-05-17 NOTE — Telephone Encounter (Signed)
Wound care orders are ok  I will send in a new insulin script

## 2023-05-17 NOTE — Telephone Encounter (Signed)
Amy from [facility] contacted the office to leave a message. Wanted to advise Susy Frizzle that [facility] had informed the [facility] that they are needing a new rx sent in for his insulin including a DX code with it. States the last time he picked up his insulin he had to pay a lot out of pocket for it because there was no DX code associated with it. [facility] was told by [facility] that it may reduce the cost to have new rx sent with DX code of diabetes. Amy also wants to request wound care orders for a superficial wound on patient's R leg, says she thinks he may have scraped it on accident. Amy can be reached at [phone] if needed

## 2023-05-18 NOTE — Telephone Encounter (Signed)
Called pt to inform them of new script sent in. Pt made aware and has already picked up new script an hour ago. No questions or concerns. Melina Copa, CMA

## 2023-05-21 DIAGNOSIS — E1122 Type 2 diabetes mellitus with diabetic chronic kidney disease: Secondary | ICD-10-CM | POA: Diagnosis not present

## 2023-05-21 DIAGNOSIS — D472 Monoclonal gammopathy: Secondary | ICD-10-CM | POA: Diagnosis not present

## 2023-05-21 DIAGNOSIS — I129 Hypertensive chronic kidney disease with stage 1 through stage 4 chronic kidney disease, or unspecified chronic kidney disease: Secondary | ICD-10-CM | POA: Diagnosis not present

## 2023-05-21 DIAGNOSIS — N184 Chronic kidney disease, stage 4 (severe): Secondary | ICD-10-CM | POA: Diagnosis not present

## 2023-05-21 DIAGNOSIS — I5030 Unspecified diastolic (congestive) heart failure: Secondary | ICD-10-CM | POA: Diagnosis not present

## 2023-05-21 DIAGNOSIS — E875 Hyperkalemia: Secondary | ICD-10-CM | POA: Diagnosis not present

## 2023-05-23 DIAGNOSIS — E1122 Type 2 diabetes mellitus with diabetic chronic kidney disease: Secondary | ICD-10-CM | POA: Diagnosis not present

## 2023-05-23 DIAGNOSIS — Z794 Long term (current) use of insulin: Secondary | ICD-10-CM | POA: Diagnosis not present

## 2023-05-23 DIAGNOSIS — N184 Chronic kidney disease, stage 4 (severe): Secondary | ICD-10-CM | POA: Diagnosis not present

## 2023-05-23 DIAGNOSIS — I130 Hypertensive heart and chronic kidney disease with heart failure and stage 1 through stage 4 chronic kidney disease, or unspecified chronic kidney disease: Secondary | ICD-10-CM | POA: Diagnosis not present

## 2023-05-23 DIAGNOSIS — I480 Paroxysmal atrial fibrillation: Secondary | ICD-10-CM | POA: Diagnosis not present

## 2023-05-23 DIAGNOSIS — I5032 Chronic diastolic (congestive) heart failure: Secondary | ICD-10-CM | POA: Diagnosis not present

## 2023-05-23 NOTE — Telephone Encounter (Signed)
[name] from [facility] called for update on wound care verbal orders, states she didn't receive any call back. Told [name] Cable's response. [name] states the pt told her that he was charged over $100 by pharmacy for meds. Pt stated the pharmacy stated the insurance wouldn't cover the meds, due to diagnosis not being on rx. [name] states she is going to give pharmacy a call to discuss more regarding pt being charged for meds. Call back # [phone]

## 2023-05-25 NOTE — Telephone Encounter (Signed)
Noted as below I sent a new rx with Dx code on it

## 2023-05-27 DIAGNOSIS — I5032 Chronic diastolic (congestive) heart failure: Secondary | ICD-10-CM | POA: Diagnosis not present

## 2023-05-27 DIAGNOSIS — Z9181 History of falling: Secondary | ICD-10-CM | POA: Diagnosis not present

## 2023-05-27 DIAGNOSIS — K7460 Unspecified cirrhosis of liver: Secondary | ICD-10-CM | POA: Diagnosis not present

## 2023-05-27 DIAGNOSIS — I130 Hypertensive heart and chronic kidney disease with heart failure and stage 1 through stage 4 chronic kidney disease, or unspecified chronic kidney disease: Secondary | ICD-10-CM | POA: Diagnosis not present

## 2023-05-27 DIAGNOSIS — Z87891 Personal history of nicotine dependence: Secondary | ICD-10-CM | POA: Diagnosis not present

## 2023-05-27 DIAGNOSIS — Z8673 Personal history of transient ischemic attack (TIA), and cerebral infarction without residual deficits: Secondary | ICD-10-CM | POA: Diagnosis not present

## 2023-05-27 DIAGNOSIS — Z7901 Long term (current) use of anticoagulants: Secondary | ICD-10-CM | POA: Diagnosis not present

## 2023-05-27 DIAGNOSIS — E785 Hyperlipidemia, unspecified: Secondary | ICD-10-CM | POA: Diagnosis not present

## 2023-05-27 DIAGNOSIS — E1122 Type 2 diabetes mellitus with diabetic chronic kidney disease: Secondary | ICD-10-CM | POA: Diagnosis not present

## 2023-05-27 DIAGNOSIS — N184 Chronic kidney disease, stage 4 (severe): Secondary | ICD-10-CM | POA: Diagnosis not present

## 2023-05-27 DIAGNOSIS — Z794 Long term (current) use of insulin: Secondary | ICD-10-CM | POA: Diagnosis not present

## 2023-05-27 DIAGNOSIS — I480 Paroxysmal atrial fibrillation: Secondary | ICD-10-CM | POA: Diagnosis not present

## 2023-05-27 DIAGNOSIS — D509 Iron deficiency anemia, unspecified: Secondary | ICD-10-CM | POA: Diagnosis not present

## 2023-05-27 DIAGNOSIS — I2510 Atherosclerotic heart disease of native coronary artery without angina pectoris: Secondary | ICD-10-CM | POA: Diagnosis not present

## 2023-05-28 DIAGNOSIS — E1122 Type 2 diabetes mellitus with diabetic chronic kidney disease: Secondary | ICD-10-CM | POA: Diagnosis not present

## 2023-05-28 DIAGNOSIS — N184 Chronic kidney disease, stage 4 (severe): Secondary | ICD-10-CM | POA: Diagnosis not present

## 2023-05-28 DIAGNOSIS — Z23 Encounter for immunization: Secondary | ICD-10-CM | POA: Diagnosis not present

## 2023-05-28 DIAGNOSIS — Z794 Long term (current) use of insulin: Secondary | ICD-10-CM | POA: Diagnosis not present

## 2023-05-28 DIAGNOSIS — I5032 Chronic diastolic (congestive) heart failure: Secondary | ICD-10-CM | POA: Diagnosis not present

## 2023-05-28 DIAGNOSIS — I130 Hypertensive heart and chronic kidney disease with heart failure and stage 1 through stage 4 chronic kidney disease, or unspecified chronic kidney disease: Secondary | ICD-10-CM | POA: Diagnosis not present

## 2023-05-28 DIAGNOSIS — I480 Paroxysmal atrial fibrillation: Secondary | ICD-10-CM | POA: Diagnosis not present

## 2023-05-29 ENCOUNTER — Telehealth: Payer: Self-pay | Admitting: Nurse Practitioner

## 2023-05-29 DIAGNOSIS — E1122 Type 2 diabetes mellitus with diabetic chronic kidney disease: Secondary | ICD-10-CM | POA: Diagnosis not present

## 2023-05-29 DIAGNOSIS — N184 Chronic kidney disease, stage 4 (severe): Secondary | ICD-10-CM | POA: Diagnosis not present

## 2023-05-29 DIAGNOSIS — I480 Paroxysmal atrial fibrillation: Secondary | ICD-10-CM | POA: Diagnosis not present

## 2023-05-29 DIAGNOSIS — I5032 Chronic diastolic (congestive) heart failure: Secondary | ICD-10-CM | POA: Diagnosis not present

## 2023-05-29 DIAGNOSIS — Z794 Long term (current) use of insulin: Secondary | ICD-10-CM | POA: Diagnosis not present

## 2023-05-29 DIAGNOSIS — I130 Hypertensive heart and chronic kidney disease with heart failure and stage 1 through stage 4 chronic kidney disease, or unspecified chronic kidney disease: Secondary | ICD-10-CM | POA: Diagnosis not present

## 2023-05-29 NOTE — Telephone Encounter (Signed)
Jake Gross, patient hh nurse contacted the office to report that the patient had a fall while walking up the steps last night. Jake Gross says patient has superficial wounds on his left knee and both arms,patient did not hit his head.Jake Gross would also like to get verbal orders for wound care, call back number is [phone].

## 2023-05-30 NOTE — Telephone Encounter (Signed)
Verbal orders are ok. Can we find out if the patient is doing at home PT please

## 2023-05-30 NOTE — Telephone Encounter (Signed)
Called and spoke to Amy. Verbal orders given. He is not currently doing PT but she will ask patient if he would like. She will reach out if orders are needed.

## 2023-06-01 DIAGNOSIS — I480 Paroxysmal atrial fibrillation: Secondary | ICD-10-CM | POA: Diagnosis not present

## 2023-06-01 DIAGNOSIS — E1122 Type 2 diabetes mellitus with diabetic chronic kidney disease: Secondary | ICD-10-CM | POA: Diagnosis not present

## 2023-06-01 DIAGNOSIS — Z794 Long term (current) use of insulin: Secondary | ICD-10-CM | POA: Diagnosis not present

## 2023-06-01 DIAGNOSIS — N184 Chronic kidney disease, stage 4 (severe): Secondary | ICD-10-CM | POA: Diagnosis not present

## 2023-06-01 DIAGNOSIS — I5032 Chronic diastolic (congestive) heart failure: Secondary | ICD-10-CM | POA: Diagnosis not present

## 2023-06-01 DIAGNOSIS — I130 Hypertensive heart and chronic kidney disease with heart failure and stage 1 through stage 4 chronic kidney disease, or unspecified chronic kidney disease: Secondary | ICD-10-CM | POA: Diagnosis not present

## 2023-06-03 DIAGNOSIS — I480 Paroxysmal atrial fibrillation: Secondary | ICD-10-CM | POA: Diagnosis not present

## 2023-06-03 DIAGNOSIS — I130 Hypertensive heart and chronic kidney disease with heart failure and stage 1 through stage 4 chronic kidney disease, or unspecified chronic kidney disease: Secondary | ICD-10-CM | POA: Diagnosis not present

## 2023-06-03 DIAGNOSIS — E1122 Type 2 diabetes mellitus with diabetic chronic kidney disease: Secondary | ICD-10-CM | POA: Diagnosis not present

## 2023-06-03 DIAGNOSIS — I5032 Chronic diastolic (congestive) heart failure: Secondary | ICD-10-CM | POA: Diagnosis not present

## 2023-06-03 DIAGNOSIS — N184 Chronic kidney disease, stage 4 (severe): Secondary | ICD-10-CM | POA: Diagnosis not present

## 2023-06-03 DIAGNOSIS — Z794 Long term (current) use of insulin: Secondary | ICD-10-CM | POA: Diagnosis not present

## 2023-06-04 DIAGNOSIS — E1122 Type 2 diabetes mellitus with diabetic chronic kidney disease: Secondary | ICD-10-CM | POA: Diagnosis not present

## 2023-06-04 DIAGNOSIS — I5032 Chronic diastolic (congestive) heart failure: Secondary | ICD-10-CM | POA: Diagnosis not present

## 2023-06-04 DIAGNOSIS — Z794 Long term (current) use of insulin: Secondary | ICD-10-CM | POA: Diagnosis not present

## 2023-06-04 DIAGNOSIS — I480 Paroxysmal atrial fibrillation: Secondary | ICD-10-CM | POA: Diagnosis not present

## 2023-06-04 DIAGNOSIS — N184 Chronic kidney disease, stage 4 (severe): Secondary | ICD-10-CM | POA: Diagnosis not present

## 2023-06-04 DIAGNOSIS — I130 Hypertensive heart and chronic kidney disease with heart failure and stage 1 through stage 4 chronic kidney disease, or unspecified chronic kidney disease: Secondary | ICD-10-CM | POA: Diagnosis not present

## 2023-06-05 DIAGNOSIS — N184 Chronic kidney disease, stage 4 (severe): Secondary | ICD-10-CM | POA: Diagnosis not present

## 2023-06-07 DIAGNOSIS — Z794 Long term (current) use of insulin: Secondary | ICD-10-CM | POA: Diagnosis not present

## 2023-06-07 DIAGNOSIS — I130 Hypertensive heart and chronic kidney disease with heart failure and stage 1 through stage 4 chronic kidney disease, or unspecified chronic kidney disease: Secondary | ICD-10-CM | POA: Diagnosis not present

## 2023-06-07 DIAGNOSIS — E1122 Type 2 diabetes mellitus with diabetic chronic kidney disease: Secondary | ICD-10-CM | POA: Diagnosis not present

## 2023-06-07 DIAGNOSIS — I480 Paroxysmal atrial fibrillation: Secondary | ICD-10-CM | POA: Diagnosis not present

## 2023-06-07 DIAGNOSIS — N184 Chronic kidney disease, stage 4 (severe): Secondary | ICD-10-CM | POA: Diagnosis not present

## 2023-06-07 DIAGNOSIS — I5032 Chronic diastolic (congestive) heart failure: Secondary | ICD-10-CM | POA: Diagnosis not present

## 2023-06-11 ENCOUNTER — Telehealth: Payer: Self-pay | Admitting: Nurse Practitioner

## 2023-06-11 ENCOUNTER — Encounter: Payer: Self-pay | Admitting: Nurse Practitioner

## 2023-06-11 ENCOUNTER — Ambulatory Visit (INDEPENDENT_AMBULATORY_CARE_PROVIDER_SITE_OTHER): Payer: Medicare Other | Admitting: Nurse Practitioner

## 2023-06-11 VITALS — BP 150/68 | HR 63 | Temp 98.2°F | Ht 67.0 in | Wt 183.5 lb

## 2023-06-11 DIAGNOSIS — E1122 Type 2 diabetes mellitus with diabetic chronic kidney disease: Secondary | ICD-10-CM | POA: Diagnosis not present

## 2023-06-11 DIAGNOSIS — Z794 Long term (current) use of insulin: Secondary | ICD-10-CM | POA: Diagnosis not present

## 2023-06-11 DIAGNOSIS — I480 Paroxysmal atrial fibrillation: Secondary | ICD-10-CM | POA: Diagnosis not present

## 2023-06-11 DIAGNOSIS — I130 Hypertensive heart and chronic kidney disease with heart failure and stage 1 through stage 4 chronic kidney disease, or unspecified chronic kidney disease: Secondary | ICD-10-CM | POA: Diagnosis not present

## 2023-06-11 DIAGNOSIS — I5032 Chronic diastolic (congestive) heart failure: Secondary | ICD-10-CM | POA: Diagnosis not present

## 2023-06-11 DIAGNOSIS — E119 Type 2 diabetes mellitus without complications: Secondary | ICD-10-CM | POA: Diagnosis not present

## 2023-06-11 DIAGNOSIS — Z7901 Long term (current) use of anticoagulants: Secondary | ICD-10-CM

## 2023-06-11 DIAGNOSIS — N184 Chronic kidney disease, stage 4 (severe): Secondary | ICD-10-CM | POA: Diagnosis not present

## 2023-06-11 LAB — POCT GLYCOSYLATED HEMOGLOBIN (HGB A1C): Hemoglobin A1C: 4.9 % (ref 4.0–5.6)

## 2023-06-11 MED ORDER — INSULIN GLARGINE-YFGN 100 UNIT/ML ~~LOC~~ SOPN
30.0000 [IU] | PEN_INJECTOR | Freq: Every day | SUBCUTANEOUS | 0 refills | Status: DC
Start: 2023-06-11 — End: 2023-07-11

## 2023-06-11 NOTE — Assessment & Plan Note (Signed)
Patient having low glucose every day when he wakes up.  Patient was decreased to 50 units of glargine we will reduce him down to 35 units and encouraged to have a snack before bed follow-up 1 month recheck sugar levels

## 2023-06-11 NOTE — Progress Notes (Signed)
Established Patient Office Visit  Subjective   Patient ID: Jake Gross, male    DOB: 08/23/1941  Age: 82 y.o. MRN: [id number]  Chief Complaint  Patient presents with   Medical Management of Chronic Issues    DM f/u    HPI  DM2: patinet currenlty on 50 units daily.  Patient has a continuous glucose monitor.  States is getting low blood pressures every morning below 70.  This morning was 54.  Patient states she does not feel bad but he did eat some peanut butter crackers and went up and had breakfast and went back down.  Patient is giving him insulin daily in the morning. Patient was seen by Dr. Melanee Spry nephrology and started back on Farxiga  Falls: Patient had a mechanical fall while at home.  Was going upstairs with all his shoes when he fell striking his left knee.  Bandage in place in office.  Patient is still having home health come by states she was there earlier today  Patient brought in information in regards to patient's out-of-pocket in regards to apixaban for patient assistance program.  They were being followed by a followed pharmacist     Review of Systems  Constitutional:  Negative for chills and fever.  Respiratory:  Negative for shortness of breath.   Cardiovascular:  Negative for chest pain.  Musculoskeletal:  Positive for falls.  Neurological:  Negative for dizziness and headaches.      Objective:     BP (!) 150/68 (BP Location: Right Arm, Patient Position: Sitting, Cuff Size: Normal)   Pulse 63   Temp 98.2 F (36.8 C) (Temporal)   Ht 5\' 7"  (1.702 m)   Wt 183 lb 8 oz (83.2 kg)   SpO2 96%   BMI 28.74 kg/m  BP Readings from Last 3 Encounters:  06/11/23 (!) 150/68  04/10/23 (!) 150/64  03/05/23 138/60   Wt Readings from Last 3 Encounters:  06/11/23 183 lb 8 oz (83.2 kg)  04/10/23 189 lb (85.7 kg)  03/05/23 184 lb 4 oz (83.6 kg)   SpO2 Readings from Last 3 Encounters:  06/11/23 96%  04/10/23 97%  03/05/23 96%      Physical Exam Vitals  and nursing note reviewed.  Constitutional:      Appearance: Normal appearance.  Cardiovascular:     Rate and Rhythm: Normal rate and regular rhythm.     Heart sounds: Normal heart sounds.  Pulmonary:     Effort: Pulmonary effort is normal.     Breath sounds: Normal breath sounds.  Abdominal:     General: Bowel sounds are normal.  Neurological:     Mental Status: He is alert.      Results for orders placed or performed in visit on 06/11/23  POCT HgB A1C  Result Value Ref Range   Hemoglobin A1C 4.9 4.0 - 5.6 %   HbA1c POC (<> result, manual entry)     HbA1c, POC (prediabetic range)     HbA1c, POC (controlled diabetic range)        The ASCVD Risk score (Arnett DK, et al., 2019) failed to calculate for the following reasons:   The 2019 ASCVD risk score is only valid for ages 60 to 5   The patient has a prior MI or stroke diagnosis    Assessment & Plan:   Problem List Items Addressed This Visit       Endocrine   Diabetes mellitus type 2, insulin dependent (HCC) - Primary  Patient having low glucose every day when he wakes up.  Patient was decreased to 50 units of glargine we will reduce him down to 35 units and encouraged to have a snack before bed follow-up 1 month recheck sugar levels      Relevant Medications   dapagliflozin propanediol (FARXIGA) 10 MG TABS tablet   insulin glargine-yfgn (SEMGLEE) 100 UNIT/ML Pen   Other Relevant Orders   POCT HgB A1C (Completed)    Return in about 4 weeks (around 07/09/2023) for DM recheck/ no A1C.    Audria Nine, NP

## 2023-06-11 NOTE — Telephone Encounter (Signed)
Jake Gross was followed by [facility] pharmacist. Patient and family member brought in information to help with PAP in regards to eliquis. Can you help with this. If a referral needs to happen please let me know

## 2023-06-11 NOTE — Telephone Encounter (Signed)
Noted will do thank you.

## 2023-06-12 ENCOUNTER — Other Ambulatory Visit: Payer: Medicare Other | Admitting: Pharmacist

## 2023-06-12 NOTE — Progress Notes (Signed)
   06/12/2023 Name: Jake Gross MRN: [id number] DOB: 1941-03-14    Jake Gross is a 82 y.o. year old male who presented for a telephone visit.   They were referred to the pharmacist by their PCP for assistance in managing medication access.    Subjective:  Care Team: Primary Care Provider: Eden Emms, NP   Medication Access/Adherence  Current Pharmacy:  [id number] [place], [place] - [address] [place] [address] Phone: [phone] Fax: [phone]   Patient reports affordability concerns with their medications: Yes - Eliquis PAP ongoing Patient reports access/transportation concerns to their pharmacy: No  Patient reports adherence concerns with their medications:  No  son, Jake Gross, completes weekly pillbox on Sundays   Atrial Fibrillation:  Current medications: apixaban 2.5mg  BID  Current medication access support: Eliquis PAP, previously completed by [facility] pharmacist. Patient provided paperwork to PCP office at 06/11/23 visit   Objective:  Lab Results  Component Value Date   HGBA1C 4.9 06/11/2023    Lab Results  Component Value Date   CREATININE 1.69 (H) 05/04/2023   BUN 21 05/04/2023   NA 140 05/04/2023   K 3.6 05/04/2023   CL 105 05/04/2023   CO2 20 05/04/2023    Lab Results  Component Value Date   CHOL 187 04/27/2022   HDL 23.70 (L) 04/27/2022   LDLCALC 98 10/28/2021   LDLDIRECT 98.0 04/27/2022   TRIG (H) 04/27/2022    486.0 Triglyceride is over 400; calculations on Lipids are invalid.   CHOLHDL 8 04/27/2022    Medications Reviewed Today   Medications were not reviewed in this encounter       Assessment/Plan:   Atrial Fibrillation: - Currently controlled, patient was unfamiliar with medication list and expressed difficulty hearing my call, attempted to also contact son, Jake Gross, who organizes pillbox to obtain full medication review - Shawn did not answer,  left VM. - Will facilitate patient assistance paperwork for Eliquis via RX med assistance team - Recommend to continue current regimen   Follow Up Plan: 2-3 weeks to ensure PAP paperwork is ongoing  Lynnda Shields, PharmD, BCPS Clinical Pharmacist [facility]

## 2023-06-12 NOTE — Patient Instructions (Signed)
Jake Gross,  Thank you for speaking with me today. I tried to contact your son, [name], to review and confirm your full medication list but was unsuccessful. I did leave him a voicemail to return my call at his convenience!  I am going to facilitate your paperwork for getting eliquis.   Take care, and we will be in touch.   Elmarie Shiley, PharmD, BCPS Clinical Pharmacist [facility]

## 2023-06-14 ENCOUNTER — Other Ambulatory Visit: Payer: Medicare Other | Admitting: Pharmacist

## 2023-06-14 DIAGNOSIS — I5032 Chronic diastolic (congestive) heart failure: Secondary | ICD-10-CM | POA: Diagnosis not present

## 2023-06-14 DIAGNOSIS — I130 Hypertensive heart and chronic kidney disease with heart failure and stage 1 through stage 4 chronic kidney disease, or unspecified chronic kidney disease: Secondary | ICD-10-CM | POA: Diagnosis not present

## 2023-06-14 DIAGNOSIS — I480 Paroxysmal atrial fibrillation: Secondary | ICD-10-CM | POA: Diagnosis not present

## 2023-06-14 DIAGNOSIS — Z794 Long term (current) use of insulin: Secondary | ICD-10-CM | POA: Diagnosis not present

## 2023-06-14 DIAGNOSIS — E1122 Type 2 diabetes mellitus with diabetic chronic kidney disease: Secondary | ICD-10-CM | POA: Diagnosis not present

## 2023-06-14 DIAGNOSIS — N184 Chronic kidney disease, stage 4 (severe): Secondary | ICD-10-CM | POA: Diagnosis not present

## 2023-06-14 NOTE — Patient Instructions (Signed)
Hi Burnie,  I reviewed and updated your medication list today, as Shawn returned my voicemail. (Thanks, Shawn!) I will keep you updated as soon as I hear more about the status of your eliquis paperwork.  Take care! Elmarie Shiley, PharmD, BCPS Clinical Pharmacist [facility]

## 2023-06-14 NOTE — Progress Notes (Signed)
Care Coordination Call  Receiving incoming call from son, [name], who returned my voicemail requesting a medication review for patient.  Reviewed and updated medication list. Will continue to monitor for updates to eliquis patient assistance application and facilitate medication access.  Lynnda Shields, PharmD, BCPS Clinical Pharmacist [facility]

## 2023-06-15 DIAGNOSIS — I480 Paroxysmal atrial fibrillation: Secondary | ICD-10-CM | POA: Diagnosis not present

## 2023-06-15 DIAGNOSIS — I5032 Chronic diastolic (congestive) heart failure: Secondary | ICD-10-CM | POA: Diagnosis not present

## 2023-06-15 DIAGNOSIS — N184 Chronic kidney disease, stage 4 (severe): Secondary | ICD-10-CM | POA: Diagnosis not present

## 2023-06-15 DIAGNOSIS — E1122 Type 2 diabetes mellitus with diabetic chronic kidney disease: Secondary | ICD-10-CM | POA: Diagnosis not present

## 2023-06-15 DIAGNOSIS — I130 Hypertensive heart and chronic kidney disease with heart failure and stage 1 through stage 4 chronic kidney disease, or unspecified chronic kidney disease: Secondary | ICD-10-CM | POA: Diagnosis not present

## 2023-06-15 DIAGNOSIS — Z794 Long term (current) use of insulin: Secondary | ICD-10-CM | POA: Diagnosis not present

## 2023-06-19 ENCOUNTER — Ambulatory Visit: Payer: Self-pay

## 2023-06-19 DIAGNOSIS — N184 Chronic kidney disease, stage 4 (severe): Secondary | ICD-10-CM | POA: Diagnosis not present

## 2023-06-19 DIAGNOSIS — Z794 Long term (current) use of insulin: Secondary | ICD-10-CM | POA: Diagnosis not present

## 2023-06-19 DIAGNOSIS — I480 Paroxysmal atrial fibrillation: Secondary | ICD-10-CM | POA: Diagnosis not present

## 2023-06-19 DIAGNOSIS — E1122 Type 2 diabetes mellitus with diabetic chronic kidney disease: Secondary | ICD-10-CM | POA: Diagnosis not present

## 2023-06-19 DIAGNOSIS — I5032 Chronic diastolic (congestive) heart failure: Secondary | ICD-10-CM | POA: Diagnosis not present

## 2023-06-19 DIAGNOSIS — I130 Hypertensive heart and chronic kidney disease with heart failure and stage 1 through stage 4 chronic kidney disease, or unspecified chronic kidney disease: Secondary | ICD-10-CM | POA: Diagnosis not present

## 2023-06-19 NOTE — Patient Outreach (Signed)
Care Coordination   Follow Up Visit Note   06/19/2023 Name: Jake Gross MRN: [id number] DOB: October 23, 1940  Jake Gross is a 82 y.o. year old male who sees Cable, Jake Churn, NP for primary care. I spoke with  [name] by phone today.  What matters to the patients health and wellness today?   DM:  Patient reports blood sugars ranging 90-130's.  He reports his insulin was decreased to 35 units which has helped decrease low blood sugars <70.  Patient reports ongoing use of CGM.  Denies any new concerns related to his diabetes.  HF:   Patient denies any increase in heart failure symptoms.  He states his legs have decreased so much from swelling that he is able to see his ankles.  Patient states he continues to follow a low salt diet.  HTN;   Patient reports his blood pressure continues to be a little high.  He states his blood pressures are ranging from 130's/60-70's to 160's/60-70's.  Patient reports having a follow up visit with his primary care on 06/11/23 and had follow up visit with the nephrologist approximately 2 weeks ago.  He states both providers are aware of his blood pressure readings. He states he takes his blood pressure list to his provider appointments.  Per chart review patient has follow up visits with the cardiologist and primary care provider this month.  FALLS:  Patient states sustaining a fall coming up the stairs into his home recently.  He reports bumps / bruises to arms and legs.  Denies any serious injury. He states this is healing.     Goals Addressed             This Visit's Progress    Patient stated: decrease blood pressure and post hospital/SNF discharge follow up       Interventions Today    Flowsheet Row Most Recent Value  Chronic Disease   Chronic disease during today's visit Diabetes, Other, Hypertension (HTN), Congestive Heart Failure (CHF)   [falls]  General Interventions   General Interventions Discussed/Reviewed General Interventions  Reviewed, Doctor Visits  [evaluation of current treatment plan for mentioned health conditions and patients adherence to plan as established by provider.  Assessed blood sugars, BP,  and HF symptoms.]  Doctor Visits Discussed/Reviewed Doctor Visits Reviewed  [reviewed provider follow up visits. Advised to keep visits with providers as recommended. Advised to notifiy provider for any new or ongoing symptoms. Confirmed patient had follow up visit with nephrologist approximately 2 weeks ago.]  Education Interventions   Education Provided Provided Education  [Advised to take blood pressure/ blood sugar readings to provider appointment.]  Provided Verbal Education On Blood Sugar Monitoring  [Discussed importance of patient eating healthy snack prior to bed.  Confirmed patient understands management of hypoglycemic events.]  Nutrition Interventions   Nutrition Discussed/Reviewed Decreasing salt  [continue to follow a low salt diet.]  Pharmacy Interventions   Pharmacy Dicussed/Reviewed Pharmacy Topics Reviewed  [medications reviewed.  Confirmed patient following newly prescribed dosage change to insulin.  Discussed importance of medication compliance.]  Safety Interventions   Safety Discussed/Reviewed Fall Risk  [Assessed for falls / injuries from fall. Fall prevention discussed.]                  SDOH assessments and interventions completed:  No     Care Coordination Interventions:  Yes, provided   Follow up plan: Follow up call scheduled for 08/10/23    Encounter Outcome:  Pt. Visit  Completed   Jake Ina RN,BSN,CCM [facility] Care Coordination [phone] direct line

## 2023-06-19 NOTE — Patient Instructions (Signed)
Visit Information  Thank you for taking time to visit with me today. Please don't hesitate to contact me if I can be of assistance to you.   Following are the goals we discussed today:   Goals Addressed             This Visit's Progress    Patient stated: decrease blood pressure and post hospital/SNF discharge follow up       Interventions Today    Flowsheet Row Most Recent Value  Chronic Disease   Chronic disease during today's visit Diabetes, Other, Hypertension (HTN), Congestive Heart Failure (CHF)   [falls]  General Interventions   General Interventions Discussed/Reviewed General Interventions Reviewed, Doctor Visits  [evaluation of current treatment plan for mentioned health conditions and patients adherence to plan as established by provider.  Assessed blood sugars, BP,  and HF symptoms.]  Doctor Visits Discussed/Reviewed Doctor Visits Reviewed  [reviewed provider follow up visits. Advised to keep visits with providers as recommended. Advised to notifiy provider for any new or ongoing symptoms. Confirmed patient had follow up visit with nephrologist approximately 2 weeks ago.]  Education Interventions   Education Provided Provided Education  [Advised to take blood pressure/ blood sugar readings to provider appointment.]  Provided Verbal Education On Blood Sugar Monitoring  [Discussed importance of patient eating healthy snack prior to bed.  Confirmed patient understands management of hypoglycemic events.]  Nutrition Interventions   Nutrition Discussed/Reviewed Decreasing salt  [continue to follow a low salt diet.]  Pharmacy Interventions   Pharmacy Dicussed/Reviewed Pharmacy Topics Reviewed  [medications reviewed.  Confirmed patient following newly prescribed dosage change to insulin.  Discussed importance of medication compliance.]  Safety Interventions   Safety Discussed/Reviewed Fall Risk  [Assessed for falls / injuries from fall. Fall prevention discussed.]                   Our next appointment is by telephone on 08/10/23 at 10 am  Please call the care guide team at [phone] if you need to cancel or reschedule your appointment.   If you are experiencing a Mental Health or Behavioral Health Crisis or need someone to talk to, please call the Suicide and Crisis Lifeline: 988 call 1-800-273-TALK (toll free, 24 hour hotline)  Patient verbalizes understanding of instructions and care plan provided today and agrees to view in MyChart. Active MyChart status and patient understanding of how to access instructions and care plan via MyChart confirmed with patient.     [name] RN,BSN,CCM [facility] Care Coordination [phone] direct line

## 2023-06-21 ENCOUNTER — Telehealth: Payer: Self-pay

## 2023-06-21 ENCOUNTER — Other Ambulatory Visit (HOSPITAL_COMMUNITY): Payer: Self-pay

## 2023-06-21 NOTE — Telephone Encounter (Signed)
Application has been mailed to pt

## 2023-06-21 NOTE — Telephone Encounter (Signed)
-----   Message from Gabriel Carina sent at 06/15/2023  4:27 PM EDT ----- Yes please!! Thank you ----- Message ----- From: Oswald Hillock, CPhT Sent: 06/15/2023   3:22 PM EDT To: Gabriel Carina, [facility]  Hey, we received a fax for him with a [facility] O[facility] expense report. I called [employer] and he doesn't have an application on file for this year. Would you like me to start that process? Thanks ----- Message ----- From: Gabriel Carina, [facility] Sent: 06/12/2023   9:43 AM EDT To: Rx Med Assistance Team  Hi, Can you please keep an eye out for receiving a fax for this patient?  Previously on PAP for Eliquis for several years, was historically completed by [facility] pharmacist. Patient brought paperwork to PCP office visit. He confirmed with me that has has already spent his out of pocket minimum and is familiar with the process.  Please let me know once you receive the paperwork or feel free to outreach if he submitted an incomplete application.  Thanks!  Thurston Hole

## 2023-06-22 DIAGNOSIS — I480 Paroxysmal atrial fibrillation: Secondary | ICD-10-CM | POA: Diagnosis not present

## 2023-06-22 DIAGNOSIS — Z794 Long term (current) use of insulin: Secondary | ICD-10-CM | POA: Diagnosis not present

## 2023-06-22 DIAGNOSIS — I5032 Chronic diastolic (congestive) heart failure: Secondary | ICD-10-CM | POA: Diagnosis not present

## 2023-06-22 DIAGNOSIS — E1122 Type 2 diabetes mellitus with diabetic chronic kidney disease: Secondary | ICD-10-CM | POA: Diagnosis not present

## 2023-06-22 DIAGNOSIS — N184 Chronic kidney disease, stage 4 (severe): Secondary | ICD-10-CM | POA: Diagnosis not present

## 2023-06-22 DIAGNOSIS — I130 Hypertensive heart and chronic kidney disease with heart failure and stage 1 through stage 4 chronic kidney disease, or unspecified chronic kidney disease: Secondary | ICD-10-CM | POA: Diagnosis not present

## 2023-06-25 DIAGNOSIS — I5032 Chronic diastolic (congestive) heart failure: Secondary | ICD-10-CM | POA: Diagnosis not present

## 2023-06-25 DIAGNOSIS — E1122 Type 2 diabetes mellitus with diabetic chronic kidney disease: Secondary | ICD-10-CM | POA: Diagnosis not present

## 2023-06-25 DIAGNOSIS — Z794 Long term (current) use of insulin: Secondary | ICD-10-CM | POA: Diagnosis not present

## 2023-06-25 DIAGNOSIS — I480 Paroxysmal atrial fibrillation: Secondary | ICD-10-CM | POA: Diagnosis not present

## 2023-06-25 DIAGNOSIS — N184 Chronic kidney disease, stage 4 (severe): Secondary | ICD-10-CM | POA: Diagnosis not present

## 2023-06-25 DIAGNOSIS — I130 Hypertensive heart and chronic kidney disease with heart failure and stage 1 through stage 4 chronic kidney disease, or unspecified chronic kidney disease: Secondary | ICD-10-CM | POA: Diagnosis not present

## 2023-06-26 DIAGNOSIS — D509 Iron deficiency anemia, unspecified: Secondary | ICD-10-CM | POA: Diagnosis not present

## 2023-06-26 DIAGNOSIS — I130 Hypertensive heart and chronic kidney disease with heart failure and stage 1 through stage 4 chronic kidney disease, or unspecified chronic kidney disease: Secondary | ICD-10-CM | POA: Diagnosis not present

## 2023-06-26 DIAGNOSIS — E1122 Type 2 diabetes mellitus with diabetic chronic kidney disease: Secondary | ICD-10-CM | POA: Diagnosis not present

## 2023-06-26 DIAGNOSIS — E785 Hyperlipidemia, unspecified: Secondary | ICD-10-CM | POA: Diagnosis not present

## 2023-06-26 DIAGNOSIS — I5032 Chronic diastolic (congestive) heart failure: Secondary | ICD-10-CM | POA: Diagnosis not present

## 2023-06-26 DIAGNOSIS — Z9181 History of falling: Secondary | ICD-10-CM | POA: Diagnosis not present

## 2023-06-26 DIAGNOSIS — K7460 Unspecified cirrhosis of liver: Secondary | ICD-10-CM | POA: Diagnosis not present

## 2023-06-26 DIAGNOSIS — Z8673 Personal history of transient ischemic attack (TIA), and cerebral infarction without residual deficits: Secondary | ICD-10-CM | POA: Diagnosis not present

## 2023-06-26 DIAGNOSIS — I480 Paroxysmal atrial fibrillation: Secondary | ICD-10-CM | POA: Diagnosis not present

## 2023-06-26 DIAGNOSIS — Z87891 Personal history of nicotine dependence: Secondary | ICD-10-CM | POA: Diagnosis not present

## 2023-06-26 DIAGNOSIS — Z7901 Long term (current) use of anticoagulants: Secondary | ICD-10-CM | POA: Diagnosis not present

## 2023-06-26 DIAGNOSIS — I2510 Atherosclerotic heart disease of native coronary artery without angina pectoris: Secondary | ICD-10-CM | POA: Diagnosis not present

## 2023-06-26 DIAGNOSIS — Z794 Long term (current) use of insulin: Secondary | ICD-10-CM | POA: Diagnosis not present

## 2023-06-26 DIAGNOSIS — N184 Chronic kidney disease, stage 4 (severe): Secondary | ICD-10-CM | POA: Diagnosis not present

## 2023-06-27 ENCOUNTER — Telehealth: Payer: Self-pay | Admitting: Pharmacist

## 2023-06-27 NOTE — Progress Notes (Signed)
Care Coordination Call  Attempted to contact patient to discuss ongoing patience assistance application for Eliquis. Left HIPAA compliant message for patient to return my call at their convenience.   Lynnda Shields, PharmD, BCPS Clinical Pharmacist [facility]

## 2023-06-29 ENCOUNTER — Telehealth: Payer: Self-pay | Admitting: Nurse Practitioner

## 2023-06-29 NOTE — Telephone Encounter (Signed)
Home Health verbal orders Caller Name: [name]  Agency Name: [facility]   Callback number: [phone]  Requesting OT/PT/Skilled nursing/Social Work/Speech: nursing   Reason: re certification   Frequency: once a week for 4 weeks   Please forward to [facility] pool or providers CMA

## 2023-06-29 NOTE — Telephone Encounter (Signed)
Called left detailed message on secured voicemail for  Jake Gross. Advised approval and to call if any questions.

## 2023-06-29 NOTE — Telephone Encounter (Signed)
Verbal orders ok

## 2023-07-03 DIAGNOSIS — N184 Chronic kidney disease, stage 4 (severe): Secondary | ICD-10-CM | POA: Diagnosis not present

## 2023-07-03 DIAGNOSIS — Z794 Long term (current) use of insulin: Secondary | ICD-10-CM | POA: Diagnosis not present

## 2023-07-03 DIAGNOSIS — I5032 Chronic diastolic (congestive) heart failure: Secondary | ICD-10-CM | POA: Diagnosis not present

## 2023-07-03 DIAGNOSIS — E1122 Type 2 diabetes mellitus with diabetic chronic kidney disease: Secondary | ICD-10-CM | POA: Diagnosis not present

## 2023-07-03 DIAGNOSIS — I480 Paroxysmal atrial fibrillation: Secondary | ICD-10-CM | POA: Diagnosis not present

## 2023-07-03 DIAGNOSIS — I130 Hypertensive heart and chronic kidney disease with heart failure and stage 1 through stage 4 chronic kidney disease, or unspecified chronic kidney disease: Secondary | ICD-10-CM | POA: Diagnosis not present

## 2023-07-04 DIAGNOSIS — I480 Paroxysmal atrial fibrillation: Secondary | ICD-10-CM | POA: Diagnosis not present

## 2023-07-04 DIAGNOSIS — I5032 Chronic diastolic (congestive) heart failure: Secondary | ICD-10-CM | POA: Diagnosis not present

## 2023-07-04 DIAGNOSIS — N184 Chronic kidney disease, stage 4 (severe): Secondary | ICD-10-CM | POA: Diagnosis not present

## 2023-07-04 DIAGNOSIS — I130 Hypertensive heart and chronic kidney disease with heart failure and stage 1 through stage 4 chronic kidney disease, or unspecified chronic kidney disease: Secondary | ICD-10-CM | POA: Diagnosis not present

## 2023-07-04 DIAGNOSIS — Z794 Long term (current) use of insulin: Secondary | ICD-10-CM | POA: Diagnosis not present

## 2023-07-04 DIAGNOSIS — E1122 Type 2 diabetes mellitus with diabetic chronic kidney disease: Secondary | ICD-10-CM | POA: Diagnosis not present

## 2023-07-09 DIAGNOSIS — N184 Chronic kidney disease, stage 4 (severe): Secondary | ICD-10-CM | POA: Diagnosis not present

## 2023-07-09 DIAGNOSIS — I5032 Chronic diastolic (congestive) heart failure: Secondary | ICD-10-CM | POA: Diagnosis not present

## 2023-07-09 DIAGNOSIS — I130 Hypertensive heart and chronic kidney disease with heart failure and stage 1 through stage 4 chronic kidney disease, or unspecified chronic kidney disease: Secondary | ICD-10-CM | POA: Diagnosis not present

## 2023-07-09 DIAGNOSIS — I480 Paroxysmal atrial fibrillation: Secondary | ICD-10-CM | POA: Diagnosis not present

## 2023-07-09 DIAGNOSIS — Z794 Long term (current) use of insulin: Secondary | ICD-10-CM | POA: Diagnosis not present

## 2023-07-09 DIAGNOSIS — E1122 Type 2 diabetes mellitus with diabetic chronic kidney disease: Secondary | ICD-10-CM | POA: Diagnosis not present

## 2023-07-10 DIAGNOSIS — N184 Chronic kidney disease, stage 4 (severe): Secondary | ICD-10-CM | POA: Diagnosis not present

## 2023-07-10 DIAGNOSIS — I5032 Chronic diastolic (congestive) heart failure: Secondary | ICD-10-CM | POA: Diagnosis not present

## 2023-07-10 DIAGNOSIS — Z794 Long term (current) use of insulin: Secondary | ICD-10-CM | POA: Diagnosis not present

## 2023-07-10 DIAGNOSIS — E1122 Type 2 diabetes mellitus with diabetic chronic kidney disease: Secondary | ICD-10-CM | POA: Diagnosis not present

## 2023-07-10 DIAGNOSIS — I130 Hypertensive heart and chronic kidney disease with heart failure and stage 1 through stage 4 chronic kidney disease, or unspecified chronic kidney disease: Secondary | ICD-10-CM | POA: Diagnosis not present

## 2023-07-10 DIAGNOSIS — I480 Paroxysmal atrial fibrillation: Secondary | ICD-10-CM | POA: Diagnosis not present

## 2023-07-11 ENCOUNTER — Encounter: Payer: Self-pay | Admitting: Physician Assistant

## 2023-07-11 ENCOUNTER — Ambulatory Visit: Payer: Medicare Other | Attending: Physician Assistant | Admitting: Physician Assistant

## 2023-07-11 VITALS — BP 144/68 | HR 64 | Ht 67.0 in | Wt 186.6 lb

## 2023-07-11 DIAGNOSIS — I5032 Chronic diastolic (congestive) heart failure: Secondary | ICD-10-CM | POA: Diagnosis not present

## 2023-07-11 DIAGNOSIS — E782 Mixed hyperlipidemia: Secondary | ICD-10-CM | POA: Diagnosis not present

## 2023-07-11 DIAGNOSIS — I10 Essential (primary) hypertension: Secondary | ICD-10-CM | POA: Insufficient documentation

## 2023-07-11 DIAGNOSIS — I2510 Atherosclerotic heart disease of native coronary artery without angina pectoris: Secondary | ICD-10-CM | POA: Insufficient documentation

## 2023-07-11 DIAGNOSIS — I480 Paroxysmal atrial fibrillation: Secondary | ICD-10-CM | POA: Insufficient documentation

## 2023-07-11 DIAGNOSIS — N184 Chronic kidney disease, stage 4 (severe): Secondary | ICD-10-CM | POA: Diagnosis not present

## 2023-07-11 MED ORDER — ISOSORBIDE MONONITRATE ER 60 MG PO TB24: 90.0000 mg | ORAL_TABLET | Freq: Every day | ORAL | 3 refills | Status: DC

## 2023-07-11 NOTE — Patient Instructions (Signed)
Medication Instructions:  Your physician has recommended you make the following change in your medication:   INCREASE Imdur (Isosorbide) to 60 mg taking 1 1/2 daily  *If you need a refill on your cardiac medications before your next appointment, please call your pharmacy*   Lab Work: None ordered  If you have labs (blood work) drawn today and your tests are completely normal, you will receive your results only by: MyChart Message (if you have MyChart) OR A paper copy in the mail If you have any lab test that is abnormal or we need to change your treatment, we will call you to review the results.   Testing/Procedures: None ordered   Follow-Up: At [facility], you and your health needs are our priority.  As part of our continuing mission to provide you with exceptional heart care, we have created designated Provider Care Teams.  These Care Teams include your primary Cardiologist (physician) and Advanced Practice Providers (APPs -  Physician Assistants and Nurse Practitioners) who all work together to provide you with the care you need, when you need it.  We recommend signing up for the patient portal called "MyChart".  Sign up information is provided on this After Visit Summary.  MyChart is used to connect with patients for Virtual Visits (Telemedicine).  Patients are able to view lab/test results, encounter notes, upcoming appointments, etc.  Non-urgent messages can be sent to your provider as well.   To learn more about what you can do with MyChart, go to [email or web address].    Your next appointment:   6 month(s)  Provider:   Verne Carrow, MD  or Tereso Newcomer, PA-C         Other Instructions

## 2023-07-11 NOTE — Progress Notes (Addendum)
Cardiology Office Note:    Date:  07/11/2023  ID:  [name], DOB 05/07/1941, MRN [id number] PCP: Eden Emms, NP  [facility] Providers Cardiologist:  Verne Carrow, MD Electrophysiologist:  Hillis Range, MD (Inactive)       Patient Profile:      Coronary artery disease S/p CABG in 11/2018 (HFpEF) heart failure with preserved ejection fraction  TTE 02/04/2023: EF 60-65, no RWMA, mild LVH, mildly reduced RVSF, mild LAE, trivial MR, trivial AI, borderline dilation of ascending aorta (41 mm), RAP 3 Paroxysmal atrial fibrillation  Hypertension  Hyperlipidemia  Diabetes mellitus  Chronic kidney disease 4 Dr. Valentino Nose Hx of CVA Hx of GI bleed in 08/2019 Small bowel AVMs Colon CA s/p partial proctectomy in 03/2015  MGUS           History of Present Illness:  Discussed the use of AI scribe software for clinical note transcription with the patient, who gave verbal consent to proceed.   Jake Gross is a 82 y.o. male who returns for follow-up of CHF, CAD, A-fib.  He was last seen 04/10/2023.  He had been admitted twice in April 2024 with AKI on CKD in the setting of gastroenteritis.  His beta-blocker was stopped due to 2.5-second pauses.  His hydrochlorothiazide was stopped and his torsemide was reduced and Farxiga stopped.  At last visit his volume appears stable and he was taking an extra torsemide as needed for volume excess of note, his blood pressure has been maintained higher given advanced age and frail status.  After last visit, he contacted the office via MyChart with elevated blood pressures as well as increasing torsemide on his own due to lower extremity edema.  I increased his isosorbide and torsemide dose. He is here with his daughter. He reports that his blood pressures at home have been consistently over 150.  He brings in a list of his blood pressures with most in the 160s-170s and a few in the 180s.  He denies any chest pain, shortness of breath, or  passing out. He also reports that his swelling has improved. He has been taking an extra half of isosorbide when his blood pressure runs high, but he does not check his blood pressure after taking the extra half. He has been back on Farxiga, as recommended by his kidney doctor.      ROS:  See HPI No melena or hematochezia    Studies Reviewed:        Risk Assessment/Calculations:    CHA2DS2-VASc Score = 8   This indicates a 10.8% annual risk of stroke. The patient's score is based upon: CHF History: 1 HTN History: 1 Diabetes History: 1 Stroke History: 2 Vascular Disease History: 1 Age Score: 2 Gender Score: 0           Physical Exam:   VS:  BP (!) 144/68   Pulse 64   Ht 5\' 7"  (1.702 m)   Wt 186 lb 9.6 oz (84.6 kg)   SpO2 96%   BMI 29.23 kg/m    Wt Readings from Last 3 Encounters:  07/11/23 186 lb 9.6 oz (84.6 kg)  06/11/23 183 lb 8 oz (83.2 kg)  04/10/23 189 lb (85.7 kg)    Constitutional:      Appearance: Healthy appearance. Not in distress.  Neck:     Vascular: No JVR. JVD normal.  Pulmonary:     Breath sounds: Normal breath sounds. No wheezing. No rales.  Cardiovascular:  Bradycardia present. Regular rhythm.     Murmurs: There is no murmur.  Edema:    Peripheral edema present.    Pretibial: bilateral trace edema of the pretibial area. Abdominal:     Palpations: Abdomen is soft.        Assessment and Plan:     Hypertension Uncontrolled with systolic readings consistently over 150. Limited medication options due to bradycardia and chronic kidney disease.  We have decided to allow higher blood pressure for him given his frail status.  Goal blood pressure <150/90.  He is not a candidate for ACE/ARB, MRA given chronic kidney disease. -Increase Isosorbide to 90mg  daily. -Continue extra isosorbide mononitrate 30 mg in the evening if systolic BP >150 -Continue Amlodipine 10mg  daily and Hydralazine 100mg  TID. -Consider adding Doxazosin or Clonidine if blood  pressure remains elevated. -Patient to send blood pressure readings in two weeks.  Heart Failure with Preserved Ejection Fraction (EF 60-65%) Volume status stable, NYHA 2b. -Continue Farxiga 10mg  daily and Torsemide 20mg  twice daily on Mondays and Thursdays, 20mg  daily on all other days.  Coronary Artery Disease Status post CABG in 2020, no current angina symptoms.  He is not on aspirin as he is on Eliquis. -Continue Crestor 20mg  daily.  Hyperlipidemia -Continue Crestor 20mg  daily. -Consider arranging fasting lipids at next visit.  Paroxysmal Atrial Fibrillation Maintaining sinus rhythm by exam.  His creatinine has been above 1.5, and his age is 82. -Continue Eliquis 2.5mg  BID.   Chronic kidney disease  Managed by nephrology.        Dispo:  Return in about 6 months (around 01/08/2024) for Routine Follow Up with Dr. Clifton James.  Signed, Tereso Newcomer, PA-C

## 2023-07-12 ENCOUNTER — Ambulatory Visit: Payer: Medicare Other | Admitting: Nurse Practitioner

## 2023-07-12 NOTE — Telephone Encounter (Signed)
Attempted outreach to pt via Mychart, rph has also reached out to pt via phone and left HIPAA compliant vm.

## 2023-07-13 ENCOUNTER — Encounter: Payer: Self-pay | Admitting: Nurse Practitioner

## 2023-07-13 ENCOUNTER — Ambulatory Visit (INDEPENDENT_AMBULATORY_CARE_PROVIDER_SITE_OTHER): Payer: Medicare Other | Admitting: Nurse Practitioner

## 2023-07-13 VITALS — BP 150/64 | HR 64 | Temp 98.3°F | Ht 67.0 in | Wt 183.0 lb

## 2023-07-13 DIAGNOSIS — Q845 Enlarged and hypertrophic nails: Secondary | ICD-10-CM | POA: Diagnosis not present

## 2023-07-13 DIAGNOSIS — E119 Type 2 diabetes mellitus without complications: Secondary | ICD-10-CM | POA: Diagnosis not present

## 2023-07-13 DIAGNOSIS — Z794 Long term (current) use of insulin: Secondary | ICD-10-CM | POA: Diagnosis not present

## 2023-07-13 DIAGNOSIS — I10 Essential (primary) hypertension: Secondary | ICD-10-CM

## 2023-07-13 NOTE — Patient Instructions (Signed)
Nice to see you today We are going to keep the medications the same Continue to check your sugars. If you have high readings 250-300s regularly let me know If you get readings 80 or below regularly let me know also Follow up with me in 3 months, sooner if you need me

## 2023-07-13 NOTE — Assessment & Plan Note (Signed)
Patient currently maintained on 35 units of insulin along with Farxiga 10 mg from nephrology.  Last A1c 4.9 last office visit reduced insulin down to 35 units and patient is tolerating it well with acceptable glucose readings.  Will have him follow-up in 3 months for repeat A1c

## 2023-07-13 NOTE — Telephone Encounter (Signed)
Pt is waiting on LIS denial that he is currently applying for with the help of his daughter, then will send in application. Have confirmed that pt has received application.

## 2023-07-13 NOTE — Assessment & Plan Note (Signed)
Recently seen by cardiology and isosorbide was increased to 90 mg daily.  Blood pressure still slightly above goal today.  Defer further management to cardiology

## 2023-07-13 NOTE — Progress Notes (Signed)
Established Patient Office Visit  Subjective   Patient ID: Jake Gross, male    DOB: [birth year]  Age: 82 y.o. MRN: [id number]  Chief Complaint  Patient presents with   Follow-up    Pt states that he has been alright.     HPI  DM2: Patient was seen by me on 06/11/2023 for diabetes follow-up.  Patient was experiencing hypoglycemia and was placed on SGLT2 inhibitor through his nephrologist.  Patient's A1c was 4.9 at the time he was experiencing hypoglycemia.  Patient was currently on 55 units of Lantus.  I reduce patient down to 35 units daily given the addition of SGLT2 and episodes of hypoglycemia.  Patient is also at high risk for falls.  He is doing well per his report. No more falls at home. He has been checking his glucose and has been getting readings between 140-180s. He denies any low glucose readings (80) or below.  At last office visit patient had sustained a mechanical fall in his home.  Patient is receiving home health services    Review of Systems  Constitutional:  Negative for chills and fever.  Respiratory:  Negative for shortness of breath.   Cardiovascular:  Negative for chest pain.  Gastrointestinal:  Negative for constipation and diarrhea.  Neurological:  Negative for headaches.      Objective:     BP (!) 150/64   Pulse 64   Temp 98.3 F (36.8 C) (Oral)   Ht 5\' 7"  (1.702 m)   Wt 183 lb (83 kg)   SpO2 95%   BMI 28.66 kg/m  BP Readings from Last 3 Encounters:  07/13/23 (!) 150/64  07/11/23 (!) 144/68  06/11/23 (!) 150/68   Wt Readings from Last 3 Encounters:  07/13/23 183 lb (83 kg)  07/11/23 186 lb 9.6 oz (84.6 kg)  06/11/23 183 lb 8 oz (83.2 kg)      Physical Exam Vitals and nursing note reviewed.  Constitutional:      Appearance: Normal appearance.  Cardiovascular:     Rate and Rhythm: Normal rate and regular rhythm.     Heart sounds: Normal heart sounds.  Pulmonary:     Effort: Pulmonary effort is normal.     Breath sounds:  Normal breath sounds.  Abdominal:     General: Bowel sounds are normal.  Neurological:     Mental Status: He is alert.      No results found for any visits on 07/13/23.    The ASCVD Risk score (Arnett DK, et al., 2019) failed to calculate for the following reasons:   The 2019 ASCVD risk score is only valid for ages 9 to 43   The patient has a prior MI or stroke diagnosis    Assessment & Plan:   Problem List Items Addressed This Visit       Cardiovascular and Mediastinum   Essential hypertension - Primary (Chronic)    Recently seen by cardiology and isosorbide was increased to 90 mg daily.  Blood pressure still slightly above goal today.  Defer further management to cardiology        Endocrine   Diabetes mellitus type 2, insulin dependent (HCC)    Patient currently maintained on 35 units of insulin along with Farxiga 10 mg from nephrology.  Last A1c 4.9 last office visit reduced insulin down to 35 units and patient is tolerating it well with acceptable glucose readings.  Will have him follow-up in 3 months for repeat A1c  Relevant Orders   Ambulatory referral to Podiatry     Musculoskeletal and Integument   Enlarged and hypertrophic nails    Ambulatory referral to podiatry for nail cutting and trimming      Relevant Orders   Ambulatory referral to Podiatry    Return in about 3 months (around 10/12/2023) for DM recheck.    Audria Nine, NP

## 2023-07-13 NOTE — Assessment & Plan Note (Signed)
Ambulatory referral to podiatry for nail cutting and trimming

## 2023-07-16 ENCOUNTER — Other Ambulatory Visit: Payer: Self-pay | Admitting: Nurse Practitioner

## 2023-07-16 ENCOUNTER — Other Ambulatory Visit: Payer: Self-pay | Admitting: Cardiovascular Disease

## 2023-07-16 DIAGNOSIS — I130 Hypertensive heart and chronic kidney disease with heart failure and stage 1 through stage 4 chronic kidney disease, or unspecified chronic kidney disease: Secondary | ICD-10-CM | POA: Diagnosis not present

## 2023-07-16 DIAGNOSIS — E1122 Type 2 diabetes mellitus with diabetic chronic kidney disease: Secondary | ICD-10-CM | POA: Diagnosis not present

## 2023-07-16 DIAGNOSIS — N184 Chronic kidney disease, stage 4 (severe): Secondary | ICD-10-CM | POA: Diagnosis not present

## 2023-07-16 DIAGNOSIS — I5032 Chronic diastolic (congestive) heart failure: Secondary | ICD-10-CM | POA: Diagnosis not present

## 2023-07-16 DIAGNOSIS — Z794 Long term (current) use of insulin: Secondary | ICD-10-CM | POA: Diagnosis not present

## 2023-07-16 DIAGNOSIS — I480 Paroxysmal atrial fibrillation: Secondary | ICD-10-CM | POA: Diagnosis not present

## 2023-07-18 ENCOUNTER — Encounter: Payer: Self-pay | Admitting: Podiatry

## 2023-07-18 ENCOUNTER — Ambulatory Visit (INDEPENDENT_AMBULATORY_CARE_PROVIDER_SITE_OTHER): Payer: Medicare Other | Admitting: Podiatry

## 2023-07-18 DIAGNOSIS — M79674 Pain in right toe(s): Secondary | ICD-10-CM | POA: Diagnosis not present

## 2023-07-18 DIAGNOSIS — Z794 Long term (current) use of insulin: Secondary | ICD-10-CM | POA: Diagnosis not present

## 2023-07-18 DIAGNOSIS — E119 Type 2 diabetes mellitus without complications: Secondary | ICD-10-CM

## 2023-07-18 DIAGNOSIS — E1122 Type 2 diabetes mellitus with diabetic chronic kidney disease: Secondary | ICD-10-CM

## 2023-07-18 DIAGNOSIS — B351 Tinea unguium: Secondary | ICD-10-CM

## 2023-07-18 DIAGNOSIS — N1832 Chronic kidney disease, stage 3b: Secondary | ICD-10-CM | POA: Diagnosis not present

## 2023-07-18 DIAGNOSIS — M79675 Pain in left toe(s): Secondary | ICD-10-CM

## 2023-07-18 NOTE — Patient Instructions (Signed)

## 2023-07-18 NOTE — Progress Notes (Signed)
Subjective: Jake Gross presents today for diabetic foot evaluation.  Patient relates 30 year h/o diabetes.  Patient denies any h/o foot wounds.  Patient denies any numbness, tingling, burning, or pins/needle sensation in feet.  Risk factors: diabetes, h/o CVA, HTN, CAD, CHF, CKD, hyperlipidemia, h/o tobacco use in remission.  PCP is Eden Emms, NP , and last visit was July 13, 2023.  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    Arthritis    AVM (arteriovenous malformation) of small bowel, 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   CAD (coronary artery disease) 09/29/2019   Cervical spondylosis    Chronic anticoagulation    Chronic diastolic CHF (congestive heart failure) (HCC) 12/17/2018   CKD (chronic kidney disease) 12/17/2018   Diabetes mellitus    History of GI bleed 09/29/2019   Hyperlipidemia    Hypertension    Iron deficiency anemia due to chronic blood loss    Paroxysmal atrial fibrillation (HCC) 12/17/2018   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   Vitamin D deficiency     Patient Active Problem List   Diagnosis Date Noted   Enlarged and hypertrophic nails 07/13/2023   Hospital discharge follow-up 03/05/2023   Acute-on-chronic kidney injury (HCC) 02/01/2023   AKI (acute kidney injury) (HCC) 01/25/2023   Acute renal failure 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 (HCC) 09/28/2022   Elevated troponin 09/28/2022   Lactic acidosis 09/28/2022   Prolonged QT interval 09/28/2022   Medicare annual wellness visit, subsequent 10/29/2021   Abnormal SPEP 09/12/2021   Dyspnea 06/27/2021   Abscess 06/27/2021   History of anemia 06/27/2021   Nocturia 06/27/2021   Benign hypertensive kidney disease with chronic kidney disease 12/22/2019   Proteinuria 12/22/2019   Stage 3b chronic  kidney disease (HCC) 12/22/2019   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   Hypokalemia 12/22/2019   History of GI bleed 09/29/2019   CAD (coronary artery disease) 09/29/2019   AVM (arteriovenous malformation) of small bowel, 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   Iron deficiency anemia due to chronic blood loss    Chronic anticoagulation    History of CVA (cerebrovascular accident) 08/25/2019   (HFpEF) heart failure with preserved ejection fraction (HCC) 12/17/2018   Paroxysmal atrial fibrillation (HCC) 12/17/2018   CKD (chronic kidney disease) 12/17/2018   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 NO ISCHEMIA   COLONOSCOPY W/ POLYPECTOMY     5 polyps   COLONOSCOPY WITH PROPOFOL N/A 08/29/2019   Procedure: COLONOSCOPY WITH PROPOFOL;  Surgeon: Sherrilyn Rist, MD;  Location: [facility] ENDOSCOPY;  Service: Gastroenterology;  Laterality: N/A;   CORONARY ARTERY BYPASS GRAFT N/A 11/26/2018   Procedure: CORONARY ARTERY BYPASS GRAFTING (CABG), ON PUMP, TIMES FOUR, USING LEFT INTERNAL MAMMARY ARTERY AND ENDOSCOPICALLY HARVESTED LEFT SAPHENOUS VEIN;  Surgeon: Alleen Borne, MD;  Location: [facility] OR;  Service: Open Heart  Surgery;  Laterality: N/A;   ESOPHAGOGASTRODUODENOSCOPY (EGD) WITH PROPOFOL N/A 08/29/2019   Procedure: ESOPHAGOGASTRODUODENOSCOPY (EGD) WITH PROPOFOL;  Surgeon: Sherrilyn Rist, MD;  Location: [facility] ENDOSCOPY;  Service: Gastroenterology;  Laterality: N/A;   EUS N/A 03/11/2015   Procedure: LOWER ENDOSCOPIC ULTRASOUND (EUS);  Surgeon: Rachael Fee, MD;  Location: [facility] ENDOSCOPY;  Service: Endoscopy;  Laterality: N/A;   FLEXIBLE SIGMOIDOSCOPY N/A 02/02/2015   Procedure: FLEXIBLE SIGMOIDOSCOPY;  Surgeon: Louis Meckel, MD;  Location: [facility] ENDOSCOPY;  Service: Endoscopy;  Laterality: N/A;  ERBE   HEMOSTASIS CLIP PLACEMENT  08/29/2019   Procedure: HEMOSTASIS CLIP PLACEMENT;  Surgeon: Sherrilyn Rist, MD;  Location: [facility] ENDOSCOPY;  Service: Gastroenterology;;   HOT HEMOSTASIS N/A 08/29/2019   Procedure: HOT HEMOSTASIS (ARGON PLASMA COAGULATION/BICAP);  Surgeon: Sherrilyn Rist, MD;  Location: [facility] ENDOSCOPY;  Service: Gastroenterology;  Laterality: N/A;   LEFT HEART CATH AND CORONARY ANGIOGRAPHY N/A 11/20/2018   Procedure: LEFT HEART CATH AND CORONARY ANGIOGRAPHY;  Surgeon: Kathleene Hazel, MD;  Location: [facility] INVASIVE CV LAB;  Service: Cardiovascular;  Laterality: N/A;   LOOP RECORDER INSERTION N/A 08/14/2017   Procedure: LOOP RECORDER INSERTION;  Surgeon: Hillis Range, MD;  Location: [facility] INVASIVE CV LAB;  Service: Cardiovascular;  Laterality: N/A;   PARTIAL PROCTECTOMY BY TEM N/A 04/08/2015   Procedure: TEM PARTIAL PROCTECTOMY OF RECTAL MASS;  Surgeon: Karie Soda, MD;  Location: [facility] ORS;  Service: General;  Laterality: N/A;   POLYPECTOMY  08/29/2019   Procedure: POLYPECTOMY;  Surgeon: Sherrilyn Rist, MD;  Location: [facility] ENDOSCOPY;  Service: Gastroenterology;;   TEE WITHOUT CARDIOVERSION N/A 06/25/2017   Procedure: TRANSESOPHAGEAL ECHOCARDIOGRAM (TEE);  Surgeon: Chilton Si, MD;  Location: [facility] ENDOSCOPY;  Service: Cardiovascular;  Laterality: N/A;   TEE WITHOUT CARDIOVERSION N/A 11/26/2018   Procedure: TRANSESOPHAGEAL ECHOCARDIOGRAM (TEE);  Surgeon: Alleen Borne, MD;  Location: [facility] OR;  Service: Open Heart Surgery;  Laterality: N/A;   TONSILLECTOMY AND ADENOIDECTOMY     as child    Current Outpatient Medications on File Prior to Visit  Medication Sig Dispense Refill   ACCU-CHEK GUIDE test strip 1 EACH BY OTHER ROUTE 3  (THREE) TIMES DAILY AS NEEDED FOR OTHER. USE AS INSTRUCTED 100 strip 6   acetaminophen (TYLENOL) 325 MG tablet Take 2 tablets (650 mg total) by mouth every 6 (six) hours as needed for mild pain (or Fever >/= 101).     amLODipine (NORVASC) 10 MG tablet Take 10 mg by mouth daily.     apixaban (ELIQUIS) 2.5 MG TABS tablet Take 1 tablet (2.5 mg total) by mouth 2 (two) times daily. 180 tablet 1   Continuous Glucose Receiver (FREESTYLE LIBRE 2 READER) DEVI Use to check blood sugars. 1 each 0   Continuous Glucose Sensor (FREESTYLE LIBRE 2 SENSOR) MISC Apply every 14 days to check blood sugars. 6 each 1   dapagliflozin propanediol (FARXIGA) 10 MG TABS tablet Take 10 mg by mouth daily.     hydrALAZINE (APRESOLINE) 100 MG tablet Take 1 tablet (100 mg total) by mouth 3 (three) times daily. 90 tablet 3   insulin glargine (LANTUS SOLOSTAR) 100 UNIT/ML Solostar Pen Inject 35 Units into the skin daily. 15 mL 2   Insulin Pen Needle (BD PEN NEEDLE NANO 2ND GEN) 32G X 4 MM MISC Use with insulin pen as directed 100 each 3   isosorbide mononitrate (IMDUR) 60 MG 24 hr tablet Take 1.5 tablets (90 mg total) by mouth daily. 135 tablet 3  Lancets (ONETOUCH ULTRASOFT) lancets Use as instructed 100 each 12   rosuvastatin (CRESTOR) 20 MG tablet TAKE 1 TABLET BY MOUTH AT BEDTIME. SCHEDULE PHYSICAL EXAM 90 tablet 1   torsemide (DEMADEX) 20 MG tablet TAKE 1 TABLET BY MOUTH TWICE A DAY ON MONDAYS AND THURSDAYS ONLY, TAKE 1 TABLET BY MOUTH DAILY ONLY ALL OTHER DAYS 120 tablet 3   No current facility-administered medications on file prior to visit.     No Known Allergies  Social History   Occupational History   Occupation: retired    Associate Professor: [employer]  Tobacco Use   Smoking status: Former    Current packs/day: 0.00    Types: Cigarettes    Quit date: 08/13/1994    Years since quitting: 28.9   Smokeless tobacco: Never  Vaping Use   Vaping status: Never Used  Substance and Sexual Activity   Alcohol use: No     Alcohol/week: 0.0 standard drinks of alcohol   Drug use: No   Sexual activity: Not Currently    Birth control/protection: None    Family History  Problem Relation Age of Onset   Cancer Father        "all over"   Coronary artery disease Brother    Diabetes Brother    Stroke Mother    Diabetes Mother    Cancer Brother    Colon cancer Neg Hx    Esophageal cancer Neg Hx    Rectal cancer Neg Hx    Stomach cancer Neg Hx     Immunization History  Administered Date(s) Administered   Fluad Quad(high Dose 65+) 07/24/2019, 06/27/2021   Influenza, High Dose Seasonal PF 05/28/2023   Influenza,inj,Quad PF,6+ Mos 11/07/2016, 08/03/2017   PFIZER(Purple Top)SARS-COV-2 Vaccination 11/01/2019, 11/22/2019, 10/01/2020   PNEUMOCOCCAL CONJUGATE-20 10/28/2021   Tdap 10/20/2015    Objective: There were no vitals filed for this visit.  Jake Gross is a pleasant 82 y.o. male in NAD. AAO X 3.  Vascular Examination: CFT <3 seconds b/l. DP/PT pulses faintly palpable b/l. Skin temperature gradient warm to warm b/l. No pain with calf compression. No ischemia or gangrene. No cyanosis or clubbing noted b/l.    Neurological Examination: Protective sensation decreased with 10 gram monofilament b/l. Vibratory sensation decreased b/l.  Dermatological Examination: Pedal skin warm and supple b/l.   No open wounds. No interdigital macerations.  Toenails 1-5 b/l thick, discolored, elongated with subungual debris and pain on dorsal palpation.    No corns, calluses nor porokeratotic lesions noted.  Musculoskeletal Examination: Muscle strength 5/5 to all lower extremity muscle groups bilaterally. No pain, crepitus or joint limitation noted with ROM bilateral LE. No gross bony deformities bilaterally.  Radiographs: None  Last A1c:      Latest Ref Rng & Units 06/11/2023    3:16 PM 03/05/2023    2:24 PM 11/09/2022    4:12 PM  Hemoglobin A1C  Hemoglobin-A1c 4.0 - 5.6 % 4.9  6.0  6.4    Lab Results   Component Value Date   HGBA1C 4.9 06/11/2023   Assessment: 1. Pain due to onychomycosis of toenails of both feet   2. Type 2 diabetes mellitus with stage 3b chronic kidney disease, with long-term current use of insulin (HCC)   3. Encounter for diabetic foot exam (HCC)      ADA Risk Categorization: Low Risk:  Patient has all of the following: Intact protective sensation No prior foot ulcer  No severe deformity Pedal pulses present  Plan: -Patient was evaluated and treated.  All patient's and/or POA's questions/concerns answered on today's visit. -Diabetic foot examination performed today. -Continue diabetic foot care principles: inspect feet daily, monitor glucose as recommended by PCP and/or Endocrinologist, and follow prescribed diet per PCP, Endocrinologist and/or dietician. -Patient to continue soft, supportive shoe gear daily. -Mycotic toenails 1-5 bilaterally were debrided in length and girth with sterile nail nippers and dremel without incident. -Patient/POA to call should there be question/concern in the interim.  Return in about 3 months (around 10/18/2023).  Freddie Breech, DPM

## 2023-07-19 DIAGNOSIS — Z794 Long term (current) use of insulin: Secondary | ICD-10-CM | POA: Diagnosis not present

## 2023-07-19 DIAGNOSIS — I5032 Chronic diastolic (congestive) heart failure: Secondary | ICD-10-CM | POA: Diagnosis not present

## 2023-07-19 DIAGNOSIS — I480 Paroxysmal atrial fibrillation: Secondary | ICD-10-CM | POA: Diagnosis not present

## 2023-07-19 DIAGNOSIS — N184 Chronic kidney disease, stage 4 (severe): Secondary | ICD-10-CM | POA: Diagnosis not present

## 2023-07-19 DIAGNOSIS — I130 Hypertensive heart and chronic kidney disease with heart failure and stage 1 through stage 4 chronic kidney disease, or unspecified chronic kidney disease: Secondary | ICD-10-CM | POA: Diagnosis not present

## 2023-07-19 DIAGNOSIS — E1122 Type 2 diabetes mellitus with diabetic chronic kidney disease: Secondary | ICD-10-CM | POA: Diagnosis not present

## 2023-07-22 ENCOUNTER — Emergency Department (HOSPITAL_BASED_OUTPATIENT_CLINIC_OR_DEPARTMENT_OTHER)
Admission: EM | Admit: 2023-07-22 | Discharge: 2023-07-22 | Disposition: A | Discharge status: Home / Self Care | Payer: Medicare Other | Attending: Emergency Medicine | Admitting: Emergency Medicine

## 2023-07-22 ENCOUNTER — Other Ambulatory Visit: Payer: Self-pay

## 2023-07-22 ENCOUNTER — Emergency Department (HOSPITAL_BASED_OUTPATIENT_CLINIC_OR_DEPARTMENT_OTHER): Payer: Medicare Other

## 2023-07-22 ENCOUNTER — Encounter (HOSPITAL_BASED_OUTPATIENT_CLINIC_OR_DEPARTMENT_OTHER): Payer: Self-pay

## 2023-07-22 DIAGNOSIS — Z794 Long term (current) use of insulin: Secondary | ICD-10-CM | POA: Diagnosis not present

## 2023-07-22 DIAGNOSIS — S59901A Unspecified injury of right elbow, initial encounter: Secondary | ICD-10-CM | POA: Diagnosis present

## 2023-07-22 DIAGNOSIS — I10 Essential (primary) hypertension: Secondary | ICD-10-CM | POA: Diagnosis not present

## 2023-07-22 DIAGNOSIS — W01198A Fall on same level from slipping, tripping and stumbling with subsequent striking against other object, initial encounter: Secondary | ICD-10-CM | POA: Diagnosis not present

## 2023-07-22 DIAGNOSIS — R001 Bradycardia, unspecified: Secondary | ICD-10-CM | POA: Insufficient documentation

## 2023-07-22 DIAGNOSIS — E1140 Type 2 diabetes mellitus with diabetic neuropathy, unspecified: Secondary | ICD-10-CM | POA: Insufficient documentation

## 2023-07-22 DIAGNOSIS — S51011A Laceration without foreign body of right elbow, initial encounter: Secondary | ICD-10-CM | POA: Insufficient documentation

## 2023-07-22 DIAGNOSIS — I509 Heart failure, unspecified: Secondary | ICD-10-CM | POA: Diagnosis not present

## 2023-07-22 DIAGNOSIS — E1122 Type 2 diabetes mellitus with diabetic chronic kidney disease: Secondary | ICD-10-CM | POA: Diagnosis not present

## 2023-07-22 DIAGNOSIS — N184 Chronic kidney disease, stage 4 (severe): Secondary | ICD-10-CM | POA: Diagnosis not present

## 2023-07-22 DIAGNOSIS — M25521 Pain in right elbow: Secondary | ICD-10-CM | POA: Diagnosis not present

## 2023-07-22 DIAGNOSIS — I2510 Atherosclerotic heart disease of native coronary artery without angina pectoris: Secondary | ICD-10-CM | POA: Insufficient documentation

## 2023-07-22 DIAGNOSIS — W19XXXA Unspecified fall, initial encounter: Secondary | ICD-10-CM

## 2023-07-22 DIAGNOSIS — M778 Other enthesopathies, not elsewhere classified: Secondary | ICD-10-CM | POA: Diagnosis not present

## 2023-07-22 DIAGNOSIS — Z7901 Long term (current) use of anticoagulants: Secondary | ICD-10-CM | POA: Insufficient documentation

## 2023-07-22 DIAGNOSIS — I480 Paroxysmal atrial fibrillation: Secondary | ICD-10-CM | POA: Diagnosis not present

## 2023-07-22 DIAGNOSIS — I130 Hypertensive heart and chronic kidney disease with heart failure and stage 1 through stage 4 chronic kidney disease, or unspecified chronic kidney disease: Secondary | ICD-10-CM | POA: Diagnosis not present

## 2023-07-22 DIAGNOSIS — I5032 Chronic diastolic (congestive) heart failure: Secondary | ICD-10-CM | POA: Diagnosis not present

## 2023-07-22 LAB — CBG MONITORING, ED: Glucose-Capillary: 118 mg/dL — ABNORMAL HIGH (ref 70–99)

## 2023-07-22 MED ORDER — BACITRACIN ZINC 500 UNIT/GM EX OINT: 1.0000 | TOPICAL_OINTMENT | Freq: Two times a day (BID) | CUTANEOUS | 0 refills | Status: DC

## 2023-07-22 MED ORDER — LIDOCAINE-EPINEPHRINE (PF) 2 %-1:200000 IJ SOLN
20.0000 mL | Freq: Once | INTRAMUSCULAR | Status: DC
  Filled 2023-07-22: qty 20

## 2023-07-22 NOTE — ED Provider Notes (Signed)
[place] EMERGENCY DEPARTMENT AT [facility] Provider Note   CSN: [id number] Arrival date & time: 07/22/23  1350     History {Add pertinent medical, surgical, social history, OB history to HPI:1} Chief Complaint  Patient presents with   Jake Gross is a 82 y.o. male.  81 year old male with a history of diabetes with lower extremity neuropathy, CHF, atrial fibrillation on Eliquis, and CAD who presents to the emergency department after a fall.  Was at the grocery store yesterday when he reports that his legs gave out and he fell striking his right elbow on the shopping cart.  No head strike or LOC.  No preceding symptoms.  Says that he just got out of rehab for several weeks due to weakness.  No focal neurologic deficits at all.  Last tetanus was in 2017.       Home Medications Prior to Admission medications   Medication Sig Start Date End Date Taking? Authorizing Provider  ACCU-CHEK GUIDE test strip 1 EACH BY OTHER ROUTE 3 (THREE) TIMES DAILY AS NEEDED FOR OTHER. USE AS INSTRUCTED 04/23/23   Eden Emms, NP  acetaminophen (TYLENOL) 325 MG tablet Take 2 tablets (650 mg total) by mouth every 6 (six) hours as needed for mild pain (or Fever >/= 101). 02/05/23   Elgergawy, Leana Roe, MD  amLODipine (NORVASC) 10 MG tablet Take 10 mg by mouth daily.    [provider]  apixaban (ELIQUIS) 2.5 MG TABS tablet Take 1 tablet (2.5 mg total) by mouth 2 (two) times daily. 12/27/22   Kathleene Hazel, MD  Continuous Glucose Receiver (FREESTYLE LIBRE 2 READER) DEVI Use to check blood sugars. 03/30/23   Eden Emms, NP  Continuous Glucose Sensor (FREESTYLE LIBRE 2 SENSOR) MISC Apply every 14 days to check blood sugars. 03/30/23   Eden Emms, NP  dapagliflozin propanediol (FARXIGA) 10 MG TABS tablet Take 10 mg by mouth daily.    [provider]  hydrALAZINE (APRESOLINE) 100 MG tablet Take 1 tablet (100 mg total) by mouth 3 (three) times daily. 07/18/23    Kathleene Hazel, MD  insulin glargine (LANTUS SOLOSTAR) 100 UNIT/ML Solostar Pen Inject 35 Units into the skin daily. 07/16/23   Eden Emms, NP  Insulin Pen Needle (BD PEN NEEDLE NANO 2ND GEN) 32G X 4 MM MISC Use with insulin pen as directed 01/19/22   Eden Emms, NP  isosorbide mononitrate (IMDUR) 60 MG 24 hr tablet Take 1.5 tablets (90 mg total) by mouth daily. 07/11/23 10/09/23  Tereso Newcomer T, PA-C  Lancets [facility] ULTRASOFT) lancets Use as instructed 04/27/21   Romero Belling, MD  rosuvastatin (CRESTOR) 20 MG tablet TAKE 1 TABLET BY MOUTH AT BEDTIME. SCHEDULE PHYSICAL EXAM 11/09/22   Eden Emms, NP  torsemide (DEMADEX) 20 MG tablet TAKE 1 TABLET BY MOUTH TWICE A DAY ON MONDAYS AND THURSDAYS ONLY, TAKE 1 TABLET BY MOUTH DAILY ONLY ALL OTHER DAYS 04/25/23   Tereso Newcomer T, PA-C      Allergies    Patient has no known allergies.    Review of Systems   Review of Systems  Physical Exam Updated Vital Signs BP (!) 178/70 (BP Location: Left Arm)   Pulse 73   Temp 98 F (36.7 C)   Resp 18   Ht 5\' 7"  (1.702 m)   Wt 82.6 kg   SpO2 98%   BMI 28.51 kg/m  Physical Exam Vitals and nursing note reviewed.  Constitutional:  General: He is not in acute distress.    Appearance: Normal appearance. He is well-developed. He is not ill-appearing.  HENT:     Head: Normocephalic and atraumatic.     Right Ear: External ear normal.     Left Ear: External ear normal.     Nose: Nose normal.     Mouth/Throat:     Mouth: Mucous membranes are moist.     Pharynx: Oropharynx is clear.  Eyes:     Extraocular Movements: Extraocular movements intact.     Conjunctiva/sclera: Conjunctivae normal.     Pupils: Pupils are equal, round, and reactive to light.     Comments: Pupils 4 mm bilaterally  Neck:     Comments: No C-spine midline tenderness to palpation Cardiovascular:     Rate and Rhythm: Regular rhythm. Bradycardia present.     Pulses: Normal pulses.     Heart sounds: Normal  heart sounds.  Pulmonary:     Effort: Pulmonary effort is normal. No respiratory distress.     Breath sounds: Normal breath sounds.  Abdominal:     General: Abdomen is flat. Bowel sounds are normal. There is no distension.     Palpations: Abdomen is soft. There is no mass.     Tenderness: There is no abdominal tenderness. There is no guarding.  Musculoskeletal:        General: No deformity. Normal range of motion.     Cervical back: Normal range of motion and neck supple. No rigidity or tenderness.     Right lower leg: No edema.     Left lower leg: No edema.     Comments: No tenderness to palpation of chest wall.  No bruising noted.  No tenderness to palpation of bilateral clavicles.  No tenderness to palpation, bruising, or deformities noted of bilateral shoulders, wrists, hips, knees, or ankles.  Tenderness to palpation of right elbow.  4 cm laceration noted.  No joint involvement.  Skin:    General: Skin is warm and dry.  Neurological:     General: No focal deficit present.     Mental Status: He is alert and oriented to person, place, and time. Mental status is at baseline.     Cranial Nerves: No cranial nerve deficit.     Sensory: No sensory deficit.     Motor: No weakness.  Psychiatric:        Mood and Affect: Mood normal.        Behavior: Behavior normal.     ED Results / Procedures / Treatments   Labs (all labs ordered are listed, but only abnormal results are displayed) Labs Reviewed - No data to display  EKG None  Radiology No results found.  Procedures Irrigation  Date/Time: 07/22/2023 5:13 PM  Performed by: Rondel Baton, MD Authorized by: Rondel Baton, MD  Consent: Verbal consent obtained. Risks and benefits: risks, benefits and alternatives were discussed Consent given by: patient Patient understanding: patient states understanding of the procedure being performed Imaging studies: imaging studies available Patient identity confirmed:  verbally with patient Local anesthesia used: no  Anesthesia: Local anesthesia used: no  Sedation: Patient sedated: no  Patient tolerance: patient tolerated the procedure well with no immediate complications Comments: Irrigation of right elbow laceration with copious amounts of sterile water administered under high-pressure     {Document cardiac monitor, telemetry assessment procedure when appropriate:1}  Medications Ordered in ED Medications - No data to display  ED Course/ Medical Decision Making/ A&P   {  Click here for ABCD2, HEART and other calculatorsREFRESH Note before signing :1}                              Medical Decision Making Amount and/or Complexity of Data Reviewed Radiology: ordered.  Risk OTC drugs. Prescription drug management.   ***  {Document critical care time when appropriate:1} {Document review of labs and clinical decision tools ie heart score, Chads2Vasc2 etc:1}  {Document your independent review of radiology images, and any outside records:1} {Document your discussion with family members, caretakers, and with consultants:1} {Document social determinants of health affecting pt's care:1} {Document your decision making why or why not admission, treatments were needed:1} Final Clinical Impression(s) / ED Diagnoses Final diagnoses:  None    Rx / DC Orders ED Discharge Orders     None

## 2023-07-22 NOTE — ED Triage Notes (Signed)
Pt presents with a wound to the L elbow from a fall. Pt states his legs "gave out" under him. Pt denies any other injuries. Denies head injury. Pt is on Eliquis. No obvious signs of trauma to the head noted in triage.

## 2023-07-22 NOTE — Discharge Instructions (Addendum)
You were seen for your elbow cut.  Because of how long ago the cut occurred we are leaving it open.  Please use topical antibiotics (bacitracin) to prevent infection.    Do not submerge your wound in water such as a bath or pool until it heals completely.  Follow-up with your primary doctor in 3 days for a wound check.

## 2023-07-23 ENCOUNTER — Encounter: Payer: Self-pay | Admitting: Nurse Practitioner

## 2023-07-23 DIAGNOSIS — E1122 Type 2 diabetes mellitus with diabetic chronic kidney disease: Secondary | ICD-10-CM | POA: Diagnosis not present

## 2023-07-23 DIAGNOSIS — Z794 Long term (current) use of insulin: Secondary | ICD-10-CM | POA: Diagnosis not present

## 2023-07-23 DIAGNOSIS — I480 Paroxysmal atrial fibrillation: Secondary | ICD-10-CM | POA: Diagnosis not present

## 2023-07-23 DIAGNOSIS — N184 Chronic kidney disease, stage 4 (severe): Secondary | ICD-10-CM | POA: Diagnosis not present

## 2023-07-23 DIAGNOSIS — I5032 Chronic diastolic (congestive) heart failure: Secondary | ICD-10-CM | POA: Diagnosis not present

## 2023-07-23 DIAGNOSIS — I130 Hypertensive heart and chronic kidney disease with heart failure and stage 1 through stage 4 chronic kidney disease, or unspecified chronic kidney disease: Secondary | ICD-10-CM | POA: Diagnosis not present

## 2023-07-23 NOTE — Telephone Encounter (Signed)
ERROR

## 2023-07-26 DIAGNOSIS — Z7901 Long term (current) use of anticoagulants: Secondary | ICD-10-CM | POA: Diagnosis not present

## 2023-07-26 DIAGNOSIS — I5032 Chronic diastolic (congestive) heart failure: Secondary | ICD-10-CM | POA: Diagnosis not present

## 2023-07-26 DIAGNOSIS — K7460 Unspecified cirrhosis of liver: Secondary | ICD-10-CM | POA: Diagnosis not present

## 2023-07-26 DIAGNOSIS — Z794 Long term (current) use of insulin: Secondary | ICD-10-CM | POA: Diagnosis not present

## 2023-07-26 DIAGNOSIS — I130 Hypertensive heart and chronic kidney disease with heart failure and stage 1 through stage 4 chronic kidney disease, or unspecified chronic kidney disease: Secondary | ICD-10-CM | POA: Diagnosis not present

## 2023-07-26 DIAGNOSIS — N184 Chronic kidney disease, stage 4 (severe): Secondary | ICD-10-CM | POA: Diagnosis not present

## 2023-07-26 DIAGNOSIS — D509 Iron deficiency anemia, unspecified: Secondary | ICD-10-CM | POA: Diagnosis not present

## 2023-07-26 DIAGNOSIS — I2510 Atherosclerotic heart disease of native coronary artery without angina pectoris: Secondary | ICD-10-CM | POA: Diagnosis not present

## 2023-07-26 DIAGNOSIS — E1122 Type 2 diabetes mellitus with diabetic chronic kidney disease: Secondary | ICD-10-CM | POA: Diagnosis not present

## 2023-07-26 DIAGNOSIS — I480 Paroxysmal atrial fibrillation: Secondary | ICD-10-CM | POA: Diagnosis not present

## 2023-07-26 DIAGNOSIS — Z87891 Personal history of nicotine dependence: Secondary | ICD-10-CM | POA: Diagnosis not present

## 2023-07-26 DIAGNOSIS — Z9181 History of falling: Secondary | ICD-10-CM | POA: Diagnosis not present

## 2023-07-26 DIAGNOSIS — E785 Hyperlipidemia, unspecified: Secondary | ICD-10-CM | POA: Diagnosis not present

## 2023-07-26 DIAGNOSIS — Z8673 Personal history of transient ischemic attack (TIA), and cerebral infarction without residual deficits: Secondary | ICD-10-CM | POA: Diagnosis not present

## 2023-07-27 DIAGNOSIS — I130 Hypertensive heart and chronic kidney disease with heart failure and stage 1 through stage 4 chronic kidney disease, or unspecified chronic kidney disease: Secondary | ICD-10-CM | POA: Diagnosis not present

## 2023-07-27 DIAGNOSIS — E1122 Type 2 diabetes mellitus with diabetic chronic kidney disease: Secondary | ICD-10-CM | POA: Diagnosis not present

## 2023-07-27 DIAGNOSIS — I480 Paroxysmal atrial fibrillation: Secondary | ICD-10-CM | POA: Diagnosis not present

## 2023-07-27 DIAGNOSIS — N184 Chronic kidney disease, stage 4 (severe): Secondary | ICD-10-CM | POA: Diagnosis not present

## 2023-07-27 DIAGNOSIS — I5032 Chronic diastolic (congestive) heart failure: Secondary | ICD-10-CM | POA: Diagnosis not present

## 2023-07-27 DIAGNOSIS — Z794 Long term (current) use of insulin: Secondary | ICD-10-CM | POA: Diagnosis not present

## 2023-07-31 DIAGNOSIS — N184 Chronic kidney disease, stage 4 (severe): Secondary | ICD-10-CM | POA: Diagnosis not present

## 2023-07-31 DIAGNOSIS — I5032 Chronic diastolic (congestive) heart failure: Secondary | ICD-10-CM | POA: Diagnosis not present

## 2023-07-31 DIAGNOSIS — E1122 Type 2 diabetes mellitus with diabetic chronic kidney disease: Secondary | ICD-10-CM | POA: Diagnosis not present

## 2023-07-31 DIAGNOSIS — I480 Paroxysmal atrial fibrillation: Secondary | ICD-10-CM | POA: Diagnosis not present

## 2023-07-31 DIAGNOSIS — I130 Hypertensive heart and chronic kidney disease with heart failure and stage 1 through stage 4 chronic kidney disease, or unspecified chronic kidney disease: Secondary | ICD-10-CM | POA: Diagnosis not present

## 2023-07-31 DIAGNOSIS — Z794 Long term (current) use of insulin: Secondary | ICD-10-CM | POA: Diagnosis not present

## 2023-08-01 ENCOUNTER — Other Ambulatory Visit: Payer: Self-pay | Admitting: Cardiovascular Disease

## 2023-08-01 DIAGNOSIS — I480 Paroxysmal atrial fibrillation: Secondary | ICD-10-CM

## 2023-08-02 DIAGNOSIS — H2513 Age-related nuclear cataract, bilateral: Secondary | ICD-10-CM | POA: Diagnosis not present

## 2023-08-02 DIAGNOSIS — E119 Type 2 diabetes mellitus without complications: Secondary | ICD-10-CM | POA: Diagnosis not present

## 2023-08-02 DIAGNOSIS — H47321 Drusen of optic disc, right eye: Secondary | ICD-10-CM | POA: Diagnosis not present

## 2023-08-02 DIAGNOSIS — H5703 Miosis: Secondary | ICD-10-CM | POA: Diagnosis not present

## 2023-08-02 DIAGNOSIS — H43821 Vitreomacular adhesion, right eye: Secondary | ICD-10-CM | POA: Diagnosis not present

## 2023-08-02 NOTE — Telephone Encounter (Signed)
Eliquis 2.5mg  refill request received. Patient is 82 years old, weight-82.6kg, Crea-1.69 on 05/04/23, Diagnosis-Afib, and last seen by Tereso Newcomer on 07/11/23. Dose is appropriate based on dosing criteria. Will send in refill to requested pharmacy.

## 2023-08-06 DIAGNOSIS — I130 Hypertensive heart and chronic kidney disease with heart failure and stage 1 through stage 4 chronic kidney disease, or unspecified chronic kidney disease: Secondary | ICD-10-CM | POA: Diagnosis not present

## 2023-08-06 DIAGNOSIS — I480 Paroxysmal atrial fibrillation: Secondary | ICD-10-CM | POA: Diagnosis not present

## 2023-08-06 DIAGNOSIS — N184 Chronic kidney disease, stage 4 (severe): Secondary | ICD-10-CM | POA: Diagnosis not present

## 2023-08-06 DIAGNOSIS — E1122 Type 2 diabetes mellitus with diabetic chronic kidney disease: Secondary | ICD-10-CM | POA: Diagnosis not present

## 2023-08-06 DIAGNOSIS — Z794 Long term (current) use of insulin: Secondary | ICD-10-CM | POA: Diagnosis not present

## 2023-08-06 DIAGNOSIS — I5032 Chronic diastolic (congestive) heart failure: Secondary | ICD-10-CM | POA: Diagnosis not present

## 2023-08-07 DIAGNOSIS — I480 Paroxysmal atrial fibrillation: Secondary | ICD-10-CM | POA: Diagnosis not present

## 2023-08-07 DIAGNOSIS — E1122 Type 2 diabetes mellitus with diabetic chronic kidney disease: Secondary | ICD-10-CM | POA: Diagnosis not present

## 2023-08-07 DIAGNOSIS — I130 Hypertensive heart and chronic kidney disease with heart failure and stage 1 through stage 4 chronic kidney disease, or unspecified chronic kidney disease: Secondary | ICD-10-CM | POA: Diagnosis not present

## 2023-08-07 DIAGNOSIS — N184 Chronic kidney disease, stage 4 (severe): Secondary | ICD-10-CM | POA: Diagnosis not present

## 2023-08-07 DIAGNOSIS — I5032 Chronic diastolic (congestive) heart failure: Secondary | ICD-10-CM | POA: Diagnosis not present

## 2023-08-07 DIAGNOSIS — Z794 Long term (current) use of insulin: Secondary | ICD-10-CM | POA: Diagnosis not present

## 2023-08-10 ENCOUNTER — Ambulatory Visit: Payer: Self-pay

## 2023-08-10 NOTE — Patient Outreach (Signed)
Care Coordination   Follow Up Visit Note   08/10/2023 Name: Jake Gross MRN: [id number] DOB: [birth year]  Jake Gross is a 82 y.o. year old male who sees Cable, Genene Churn, NP for primary care. I spoke with  [name] by phone today.  What matters to the patients health and wellness today?  Fall:  Patient reports having ED visit on 07/22/23 due to fall and sustaining laceration to right elbow. He states he is unsure what happen other than legs giving out on him.   He states he received stitches and elbow is healing well. Patient reports home health PT services ending a few weeks ago.  Patient states he didn't follow up with his provider after the falls because the home health nurse has been monitoring the wound to his elbow. Diabetes:  Patient reports today's fasting blood sugar was 141.  He reports having blood sugars <70 2-3 times per week with lowest being 50. He states his blood sugar usually returns to normal after eating breakfast.  He also reports having a snack around 1-2 am and eating a snack before bed.   HF: Patient reports today's wgt 182 lbs.  Denies any increase in heart failure system. Patient states he is receiving home health nursing services 1 x per week which are scheduled to end on 08/20/23.  HTN:  Patient reports blood pressures are a little high.  Reports blood pressure readings ;188/74, 150/76,164/67,177/78, 155/65, 156/60. Patient states he is all medications as prescribed.     Goals Addressed             This Visit's Progress    Patient stated: decrease blood pressure and post hospital/SNF discharge follow up       Interventions Today    Flowsheet Row Most Recent Value  Chronic Disease   Chronic disease during today's visit Diabetes, Hypertension (HTN), Congestive Heart Failure (CHF), Other  [Falls]  General Interventions   General Interventions Discussed/Reviewed General Interventions Reviewed, Labs, Doctor Visits  [evaluation of current  treatment plan for mentioned health conditions and patients adherence to plan as established by provider.  Assessed for HF symptoms and BP, BS readings.]  Doctor Visits Discussed/Reviewed Doctor Visits Reviewed  [Discussed with patient importance of following up with provider by calling office and/ or visit after a fall. Reviewed upcoming provider visits. Advised to contact nephrology office regarding follwo up appointment.]  Education Interventions   Education Provided Provided Education  [Reviewed Rule of 15 hypoglycemic management.  Discussed hypoglycemic symptoms. Advised patient to notify provider if having frequent  BS<70.]  Provided Verbal Education On Blood Sugar Monitoring, When to see the doctor  [This RNCM sent message to patients primary provider regarding patient's ongoing frequent low blood sugar readings.]  Nutrition Interventions   Nutrition Discussed/Reviewed Nutrition Reviewed  [Discussed eating healthy snack before bed.]  Pharmacy Interventions   Pharmacy Dicussed/Reviewed Pharmacy Topics Reviewed  Algis Downs to take medications as prescribed.]  Safety Interventions   Safety Discussed/Reviewed Fall Risk, Safety Reviewed  [advised to use ambulatory device at all times.  Inquired if patient continues to receive home health PT services.]                  SDOH assessments and interventions completed:  No     Care Coordination Interventions:  Yes, provided   Follow up plan: Follow up call scheduled for 08/30/23    Encounter Outcome:  Patient Request to Call Back   [name] RN,BSN,CCM [facility] Care Coordination  [phone] direct line

## 2023-08-10 NOTE — Patient Instructions (Signed)
Visit Information  Thank you for taking time to visit with me today. Please don't hesitate to contact me if I can be of assistance to you.   Following are the goals we discussed today:   Goals Addressed             This Visit's Progress    Patient stated: decrease blood pressure and post hospital/SNF discharge follow up       Interventions Today    Flowsheet Row Most Recent Value  Chronic Disease   Chronic disease during today's visit Diabetes, Hypertension (HTN), Congestive Heart Failure (CHF), Other  [Falls]  General Interventions   General Interventions Discussed/Reviewed General Interventions Reviewed, Labs, Doctor Visits  [evaluation of current treatment plan for mentioned health conditions and patients adherence to plan as established by provider.  Assessed for HF symptoms and BP, BS readings.]  Doctor Visits Discussed/Reviewed Doctor Visits Reviewed  [Discussed with patient importance of following up with provider by calling office and/ or visit after a fall. Reviewed upcoming provider visits. Advised to contact nephrology office regarding follwo up appointment.]  Education Interventions   Education Provided Provided Education  [Reviewed Rule of 15 hypoglycemic management.  Discussed hypoglycemic symptoms. Advised patient to notify provider if having frequent  BS<70.]  Provided Verbal Education On Blood Sugar Monitoring, When to see the doctor  [This RNCM sent message to patients primary provider regarding patient's ongoing frequent low blood sugar readings.]  Nutrition Interventions   Nutrition Discussed/Reviewed Nutrition Reviewed  [Discussed eating healthy snack before bed.]  Pharmacy Interventions   Pharmacy Dicussed/Reviewed Pharmacy Topics Reviewed  Algis Downs to take medications as prescribed.]  Safety Interventions   Safety Discussed/Reviewed Fall Risk, Safety Reviewed  [advised to use ambulatory device at all times.  Inquired if patient continues to receive home health PT  services.]                  Our next appointment is by telephone on 08/30/23 at 2:30 pm  Please call the care guide team at [phone] if you need to cancel or reschedule your appointment.   If you are experiencing a Mental Health or Behavioral Health Crisis or need someone to talk to, please call the Suicide and Crisis Lifeline: 988 call 1-800-273-TALK (toll free, 24 hour hotline)  Patient verbalizes understanding of instructions and care plan provided today and agrees to view in MyChart. Active MyChart status and patient understanding of how to access instructions and care plan via MyChart confirmed with patient.     [name] RN,BSN,CCM [facility] Care Coordination [phone] direct line

## 2023-08-13 DIAGNOSIS — N184 Chronic kidney disease, stage 4 (severe): Secondary | ICD-10-CM | POA: Diagnosis not present

## 2023-08-14 ENCOUNTER — Telehealth: Payer: Self-pay | Admitting: Cardiovascular Disease

## 2023-08-14 DIAGNOSIS — E1122 Type 2 diabetes mellitus with diabetic chronic kidney disease: Secondary | ICD-10-CM | POA: Diagnosis not present

## 2023-08-14 DIAGNOSIS — I480 Paroxysmal atrial fibrillation: Secondary | ICD-10-CM | POA: Diagnosis not present

## 2023-08-14 DIAGNOSIS — I130 Hypertensive heart and chronic kidney disease with heart failure and stage 1 through stage 4 chronic kidney disease, or unspecified chronic kidney disease: Secondary | ICD-10-CM | POA: Diagnosis not present

## 2023-08-14 DIAGNOSIS — Z794 Long term (current) use of insulin: Secondary | ICD-10-CM | POA: Diagnosis not present

## 2023-08-14 DIAGNOSIS — N184 Chronic kidney disease, stage 4 (severe): Secondary | ICD-10-CM | POA: Diagnosis not present

## 2023-08-14 DIAGNOSIS — I5032 Chronic diastolic (congestive) heart failure: Secondary | ICD-10-CM | POA: Diagnosis not present

## 2023-08-14 NOTE — Telephone Encounter (Signed)
Called patient and left message to call back.  Called Amy the [facility] nurse, she was still at patient's home.  Kidney function stable when last checked in July.  She said he is due for labs at [facility] for the kidney doctors next week.  She said he is presenting a little more short of breath than normal and the swelling is greater than normal as well.    He has tolerated taking torsemide 20 daily, and twice a day every Mon, Thurs.  I adv to have him increase to BID today and tomorrow (tue and wed) and then back to usual dosing.  She will check back on him later this week to make sure he is going to the right direction.  Will call back if anything further is needed.

## 2023-08-14 NOTE — Telephone Encounter (Signed)
Pt c/o swelling: STAT is pt has developed SOB within 24 hours  How much weight have you gained and in what time span? About 3lbs in a week   If swelling, where is the swelling located? Ankles and a little past his ankles   Are you currently taking a fluid pill? Yes   Are you currently SOB? Yes, pt's nurse Amy noticed it but pt didn't notice it.   Do you have a log of your daily weights (if so, list)? No  Have you gained 3 pounds in a day or 5 pounds in a week? 3 lbs in a week   Have you traveled recently? No

## 2023-08-21 DIAGNOSIS — E875 Hyperkalemia: Secondary | ICD-10-CM | POA: Diagnosis not present

## 2023-08-21 DIAGNOSIS — I129 Hypertensive chronic kidney disease with stage 1 through stage 4 chronic kidney disease, or unspecified chronic kidney disease: Secondary | ICD-10-CM | POA: Diagnosis not present

## 2023-08-21 DIAGNOSIS — E785 Hyperlipidemia, unspecified: Secondary | ICD-10-CM | POA: Diagnosis not present

## 2023-08-21 DIAGNOSIS — E1122 Type 2 diabetes mellitus with diabetic chronic kidney disease: Secondary | ICD-10-CM | POA: Diagnosis not present

## 2023-08-21 DIAGNOSIS — N184 Chronic kidney disease, stage 4 (severe): Secondary | ICD-10-CM | POA: Diagnosis not present

## 2023-08-23 ENCOUNTER — Telehealth: Payer: Self-pay | Admitting: Nurse Practitioner

## 2023-08-23 DIAGNOSIS — I130 Hypertensive heart and chronic kidney disease with heart failure and stage 1 through stage 4 chronic kidney disease, or unspecified chronic kidney disease: Secondary | ICD-10-CM | POA: Diagnosis not present

## 2023-08-23 DIAGNOSIS — I5032 Chronic diastolic (congestive) heart failure: Secondary | ICD-10-CM | POA: Diagnosis not present

## 2023-08-23 DIAGNOSIS — I480 Paroxysmal atrial fibrillation: Secondary | ICD-10-CM | POA: Diagnosis not present

## 2023-08-23 DIAGNOSIS — N184 Chronic kidney disease, stage 4 (severe): Secondary | ICD-10-CM | POA: Diagnosis not present

## 2023-08-23 DIAGNOSIS — Z794 Long term (current) use of insulin: Secondary | ICD-10-CM | POA: Diagnosis not present

## 2023-08-23 DIAGNOSIS — E1122 Type 2 diabetes mellitus with diabetic chronic kidney disease: Secondary | ICD-10-CM | POA: Diagnosis not present

## 2023-08-23 NOTE — Telephone Encounter (Signed)
Verbal orders ok

## 2023-08-23 NOTE — Telephone Encounter (Signed)
Called and left message on secured voicemail for  Amy  with [employer]  of the approval of the requested verbal orders for this patient. Advised to call back with any further questions.

## 2023-08-23 NOTE — Telephone Encounter (Signed)
Home Health verbal orders Caller Name: Amy Agency Name: [facility] number: [phone]  Requesting Skilled nursing continuation  Reason: CHS and Hypertension  Frequency: 1x a week for 4 weeks  Please forward to [facility] pool or providers CMA

## 2023-08-24 ENCOUNTER — Other Ambulatory Visit: Payer: Self-pay | Admitting: Nurse Practitioner

## 2023-08-24 ENCOUNTER — Telehealth: Payer: Self-pay

## 2023-08-24 MED ORDER — LANTUS SOLOSTAR 100 UNIT/ML ~~LOC~~ SOPN: 30.0000 [IU] | PEN_INJECTOR | Freq: Every day | SUBCUTANEOUS | Status: DC

## 2023-08-24 NOTE — Telephone Encounter (Signed)
Contacted pt.  Spoke with pt about reducing his insulin units from 35 to 30.  Pt attempted to verbalize understanding, clarified with pt to go down from 35 to 30 units 2x. Pt then repeated back to me and has no questions or concerns.   Advised pt to call the office if glucose readings continue to remain low. Pt verbalized understanding.

## 2023-08-24 NOTE — Telephone Encounter (Signed)
-----   Message from [facility] sent at 08/24/2023  1:37 PM EST ----- Regarding: RE: reschedule Can we reach out to the patient and have him reduce the dose of his insulin by 5 units. So he will take 30 units instead of 35. If he continues to have low glucse readings he needs to call and let me know ----- Message ----- From: Albertine Patricia, CMA Sent: 08/22/2023   3:58 PM EST To: Eden Emms, NP Subject: FW: reschedule                                 Hey! I think this was mistakenly sent to me, sorry for the delay in sending I had assumed this was a reschedule message!  Burman Nieves, CCMA Care Coordination Care Guide Direct Dial: [phone] ----- Message ----- From: Otho Ket, RN Sent: 08/10/2023   4:48 PM EST To: Albertine Patricia, CMA Subject: reschedule                                     Good afternoon Matt,  I spoke with Mr. Ducasse today and wanted to let you know he reports still having low blood sugars readings 2-3 times per week <70.  He said the low blood sugars are usually when he first wakes up in the morning and this is with  him eating a snack in the middle of the night around 1-2 am and a snack before bed.  I reviewed the Rule of 15 for hypoglycemic treatment with him and informed him I would send you an update. He reports taking  his diabetic medications as prescribed and reports  today's fasting BS was 141. He also stated his blood pressures are running a little high: 150/76, 188/74, 164/67, 177/78, 170/70, 156/62, 158/64, 172/75.  He was last seen by cardiology on 07/12/23 and his Isorsobide was increased to 90 mg.  I wanted to update you on this because he is not scheduled to see you again until 10/12/23. He continues to have a home health nurse see him 1 x per week however this service is scheduled to end on 11/4/ 24.   Please let me know if I can be of further assistance.  Have a good evening,   [name] [facility] Care Coordination [phone] direct  line

## 2023-08-25 DIAGNOSIS — Z8673 Personal history of transient ischemic attack (TIA), and cerebral infarction without residual deficits: Secondary | ICD-10-CM | POA: Diagnosis not present

## 2023-08-25 DIAGNOSIS — E785 Hyperlipidemia, unspecified: Secondary | ICD-10-CM | POA: Diagnosis not present

## 2023-08-25 DIAGNOSIS — Z7984 Long term (current) use of oral hypoglycemic drugs: Secondary | ICD-10-CM | POA: Diagnosis not present

## 2023-08-25 DIAGNOSIS — I130 Hypertensive heart and chronic kidney disease with heart failure and stage 1 through stage 4 chronic kidney disease, or unspecified chronic kidney disease: Secondary | ICD-10-CM | POA: Diagnosis not present

## 2023-08-25 DIAGNOSIS — E1122 Type 2 diabetes mellitus with diabetic chronic kidney disease: Secondary | ICD-10-CM | POA: Diagnosis not present

## 2023-08-25 DIAGNOSIS — I5032 Chronic diastolic (congestive) heart failure: Secondary | ICD-10-CM | POA: Diagnosis not present

## 2023-08-25 DIAGNOSIS — Z794 Long term (current) use of insulin: Secondary | ICD-10-CM | POA: Diagnosis not present

## 2023-08-25 DIAGNOSIS — D509 Iron deficiency anemia, unspecified: Secondary | ICD-10-CM | POA: Diagnosis not present

## 2023-08-25 DIAGNOSIS — I480 Paroxysmal atrial fibrillation: Secondary | ICD-10-CM | POA: Diagnosis not present

## 2023-08-25 DIAGNOSIS — I2510 Atherosclerotic heart disease of native coronary artery without angina pectoris: Secondary | ICD-10-CM | POA: Diagnosis not present

## 2023-08-25 DIAGNOSIS — K7460 Unspecified cirrhosis of liver: Secondary | ICD-10-CM | POA: Diagnosis not present

## 2023-08-25 DIAGNOSIS — Z9181 History of falling: Secondary | ICD-10-CM | POA: Diagnosis not present

## 2023-08-25 DIAGNOSIS — Z85048 Personal history of other malignant neoplasm of rectum, rectosigmoid junction, and anus: Secondary | ICD-10-CM | POA: Diagnosis not present

## 2023-08-25 DIAGNOSIS — Z87891 Personal history of nicotine dependence: Secondary | ICD-10-CM | POA: Diagnosis not present

## 2023-08-25 DIAGNOSIS — Z7901 Long term (current) use of anticoagulants: Secondary | ICD-10-CM | POA: Diagnosis not present

## 2023-08-25 DIAGNOSIS — N184 Chronic kidney disease, stage 4 (severe): Secondary | ICD-10-CM | POA: Diagnosis not present

## 2023-08-28 ENCOUNTER — Telehealth: Payer: Self-pay | Admitting: Nurse Practitioner

## 2023-08-28 DIAGNOSIS — N184 Chronic kidney disease, stage 4 (severe): Secondary | ICD-10-CM | POA: Diagnosis not present

## 2023-08-28 DIAGNOSIS — K7460 Unspecified cirrhosis of liver: Secondary | ICD-10-CM | POA: Diagnosis not present

## 2023-08-28 DIAGNOSIS — I130 Hypertensive heart and chronic kidney disease with heart failure and stage 1 through stage 4 chronic kidney disease, or unspecified chronic kidney disease: Secondary | ICD-10-CM | POA: Diagnosis not present

## 2023-08-28 DIAGNOSIS — I5032 Chronic diastolic (congestive) heart failure: Secondary | ICD-10-CM | POA: Diagnosis not present

## 2023-08-28 DIAGNOSIS — E1122 Type 2 diabetes mellitus with diabetic chronic kidney disease: Secondary | ICD-10-CM | POA: Diagnosis not present

## 2023-08-28 DIAGNOSIS — I480 Paroxysmal atrial fibrillation: Secondary | ICD-10-CM | POA: Diagnosis not present

## 2023-08-28 NOTE — Telephone Encounter (Signed)
Approved.  

## 2023-08-28 NOTE — Telephone Encounter (Signed)
Home Health verbal orders Caller Name: Mare Loan  Agency Name: [facility] number: [phone]  Requesting PT  Reason: Balance training, strengthening and gait training   Frequency: 1wk 9   Please forward to [facility] pool or providers CMA

## 2023-08-29 ENCOUNTER — Telehealth: Payer: Self-pay | Admitting: Nurse Practitioner

## 2023-08-29 NOTE — Telephone Encounter (Signed)
This is a scam. HIPAA information and office notes request were faxed to [address].

## 2023-08-29 NOTE — Telephone Encounter (Signed)
Contacted Home Health Care. Spoke with [employer]. Relayed message of approved verbal orders.

## 2023-08-29 NOTE — Telephone Encounter (Signed)
Jake Gross  [facility] Phone: [phone] Called to ask if we had received a fax with HIPAA information and office notes  Was unable to confirm the patient's medication, stated it was because she is in a call center

## 2023-08-30 ENCOUNTER — Ambulatory Visit: Payer: Self-pay

## 2023-08-30 DIAGNOSIS — I480 Paroxysmal atrial fibrillation: Secondary | ICD-10-CM | POA: Diagnosis not present

## 2023-08-30 DIAGNOSIS — N184 Chronic kidney disease, stage 4 (severe): Secondary | ICD-10-CM | POA: Diagnosis not present

## 2023-08-30 DIAGNOSIS — I5032 Chronic diastolic (congestive) heart failure: Secondary | ICD-10-CM | POA: Diagnosis not present

## 2023-08-30 DIAGNOSIS — I130 Hypertensive heart and chronic kidney disease with heart failure and stage 1 through stage 4 chronic kidney disease, or unspecified chronic kidney disease: Secondary | ICD-10-CM | POA: Diagnosis not present

## 2023-08-30 DIAGNOSIS — E1122 Type 2 diabetes mellitus with diabetic chronic kidney disease: Secondary | ICD-10-CM | POA: Diagnosis not present

## 2023-08-30 DIAGNOSIS — K7460 Unspecified cirrhosis of liver: Secondary | ICD-10-CM | POA: Diagnosis not present

## 2023-08-30 NOTE — Patient Outreach (Signed)
  Care Coordination   Follow Up Visit Note   08/30/2023 Name: Jake Gross MRN: [id number] DOB: [birth year]  Jake Gross is a 82 y.o. year old male who sees Cable, Genene Churn, NP for primary care. I spoke with  [name] by phone today.  What matters to the patients health and wellness today?  Patient states he continues to have [facility] PT.  Denies falls since last outreach with RNCM.   Patient states he is back on normal dosing scheduled for torsemide 20 mg 2 x day on Monday and Thursday.  He denies increase in shortness of breath or swelling.  Reports today's weight was 185.2 lbs.  Patient reports fasting blood sugar today was 91.  He reports having only 1 low blood sugar <70 since last outreach with RNCM.  Patient reports  BP reading of 191/72 and 140/68.  Patient states he takes his BP readings to his primary provider visits.  He states primary care provider is aware of fluctuating BP's. Patient states he is taking all of his medications as prescribed.    Goals Addressed             This Visit's Progress    Patient stated: decrease blood pressure and post hospital/SNF discharge follow up       Interventions Today    Flowsheet Row Most Recent Value  Chronic Disease   Chronic disease during today's visit Diabetes, Congestive Heart Failure (CHF), Hypertension (HTN), Other  [falls]  General Interventions   General Interventions Discussed/Reviewed General Interventions Reviewed, Doctor Visits  [evaluation of current treatment plan for mentioned health conditions and assessed patients adherence to plan as established by provider.  Assessed for BP and BS readings, assessed for HF symptoms and falls.]  Doctor Visits Discussed/Reviewed Doctor Visits Reviewed  [advised to follow up with providers as recommended.]  Exercise Interventions   Exercise Discussed/Reviewed Physical Activity  [Inquired if patient still receiving [facility] PT.]  Education Interventions   Provided Verbal Education On  Blood Sugar Monitoring  [assessed for ongoing hypoglycemic events.  Advised patient to notify provider for frequent low blood sugar readings. Confirmed patient aware of how to treat hypoglycemic event.]  Nutrition Interventions   Nutrition Discussed/Reviewed Nutrition Reviewed  Pharmacy Interventions   Pharmacy Dicussed/Reviewed Pharmacy Topics Reviewed  [confirmed patient taking torsemide as instructed by provider.  Advised to take all medications as prescribed.]  Safety Interventions   Safety Discussed/Reviewed Fall Risk                  SDOH assessments and interventions completed:  No     Care Coordination Interventions:  Yes, provided   Follow up plan: Follow up call scheduled for 10/08/23    Encounter Outcome:  Patient Visit Completed   [name] RN,BSN,CCM [facility]  [facility] coordinator / Case Manager Phone: [phone]

## 2023-08-30 NOTE — Patient Instructions (Signed)
Visit Information  Thank you for taking time to visit with me today. Please don't hesitate to contact me if I can be of assistance to you.   Following are the goals we discussed today:   Goals Addressed             This Visit's Progress    Patient stated: decrease blood pressure and post hospital/SNF discharge follow up       Interventions Today    Flowsheet Row Most Recent Value  Chronic Disease   Chronic disease during today's visit Diabetes, Congestive Heart Failure (CHF), Hypertension (HTN), Other  [falls]  General Interventions   General Interventions Discussed/Reviewed General Interventions Reviewed, Doctor Visits  [evaluation of current treatment plan for mentioned health conditions and assessed patients adherence to plan as established by provider.  Assessed for BP and BS readings, assessed for HF symptoms and falls.]  Doctor Visits Discussed/Reviewed Doctor Visits Reviewed  [advised to follow up with providers as recommended.]  Exercise Interventions   Exercise Discussed/Reviewed Physical Activity  [Inquired if patient still receiving [facility] PT.]  Education Interventions   Provided Verbal Education On Blood Sugar Monitoring  [assessed for ongoing hypoglycemic events.  Advised patient to notify provider for frequent low blood sugar readings. Confirmed patient aware of how to treat hypoglycemic event.]  Nutrition Interventions   Nutrition Discussed/Reviewed Nutrition Reviewed  Pharmacy Interventions   Pharmacy Dicussed/Reviewed Pharmacy Topics Reviewed  [confirmed patient taking torsemide as instructed by provider.  Advised to take all medications as prescribed.]  Safety Interventions   Safety Discussed/Reviewed Fall Risk                  Our next appointment is by telephone on 10/08/23 at 1:30 pm  Please call the care guide team at [phone] if you need to cancel or reschedule your appointment.   If you are experiencing a Mental Health or Behavioral Health  Crisis or need someone to talk to, please call the Suicide and Crisis Lifeline: 988 call 1-800-273-TALK (toll free, 24 hour hotline)  Patient verbalizes understanding of instructions and care plan provided today and agrees to view in MyChart. Active MyChart status and patient understanding of how to access instructions and care plan via MyChart confirmed with patient.     [name] RN,BSN,CCM [place]  [facility], [facility] coordinator / Case Manager Phone: [phone]

## 2023-09-05 DIAGNOSIS — I480 Paroxysmal atrial fibrillation: Secondary | ICD-10-CM | POA: Diagnosis not present

## 2023-09-05 DIAGNOSIS — N184 Chronic kidney disease, stage 4 (severe): Secondary | ICD-10-CM | POA: Diagnosis not present

## 2023-09-05 DIAGNOSIS — E1122 Type 2 diabetes mellitus with diabetic chronic kidney disease: Secondary | ICD-10-CM | POA: Diagnosis not present

## 2023-09-05 DIAGNOSIS — K7460 Unspecified cirrhosis of liver: Secondary | ICD-10-CM | POA: Diagnosis not present

## 2023-09-05 DIAGNOSIS — I5032 Chronic diastolic (congestive) heart failure: Secondary | ICD-10-CM | POA: Diagnosis not present

## 2023-09-05 DIAGNOSIS — I130 Hypertensive heart and chronic kidney disease with heart failure and stage 1 through stage 4 chronic kidney disease, or unspecified chronic kidney disease: Secondary | ICD-10-CM | POA: Diagnosis not present

## 2023-09-06 ENCOUNTER — Telehealth: Payer: Self-pay | Admitting: Nurse Practitioner

## 2023-09-06 DIAGNOSIS — I5032 Chronic diastolic (congestive) heart failure: Secondary | ICD-10-CM | POA: Diagnosis not present

## 2023-09-06 DIAGNOSIS — I480 Paroxysmal atrial fibrillation: Secondary | ICD-10-CM | POA: Diagnosis not present

## 2023-09-06 DIAGNOSIS — I130 Hypertensive heart and chronic kidney disease with heart failure and stage 1 through stage 4 chronic kidney disease, or unspecified chronic kidney disease: Secondary | ICD-10-CM | POA: Diagnosis not present

## 2023-09-06 DIAGNOSIS — N184 Chronic kidney disease, stage 4 (severe): Secondary | ICD-10-CM | POA: Diagnosis not present

## 2023-09-06 DIAGNOSIS — K7460 Unspecified cirrhosis of liver: Secondary | ICD-10-CM | POA: Diagnosis not present

## 2023-09-06 DIAGNOSIS — E1122 Type 2 diabetes mellitus with diabetic chronic kidney disease: Secondary | ICD-10-CM | POA: Diagnosis not present

## 2023-09-06 NOTE — Telephone Encounter (Signed)
Amy from [facility] called stating that patient is getting a lot of medical equipment from [facility],and she would like to know if [facility] sent  orders out to this medical place ?She is wondering why all of this stuff is coming? Sjhe wanted to know if Susy Frizzle has anything to do with this?

## 2023-09-06 NOTE — Telephone Encounter (Signed)
Returned call from Amy from [facility]. Confirmed to amy that medical equipment was not ordered by [facility].  Amy stated that she was able to get everything handled and made sure supplies stopped coming to the address.  Amy stated that she will call back in 2 weeks with update and verbal order requests.

## 2023-09-10 NOTE — Telephone Encounter (Signed)
[facility] confirming if we received the faxes they've been sending for [facility] to comp. Pharmacy stated they need to confirm if the pt is a pt of [facility] & also needs the pt's last visit chart notes. Pharmacy states they refax over forms for [facility] to comp. Call back # [phone]

## 2023-09-10 NOTE — Telephone Encounter (Signed)
These are scam calls from a call center attempting to retrieve private information from pt. The information on [employer] as well as the phone number provided does not come from an accredited organization. The faxes have been sent everyday for one week and have been deleted.  Please do not fax any office chart notes over  [facility] Phone: [phone]

## 2023-09-11 DIAGNOSIS — I5032 Chronic diastolic (congestive) heart failure: Secondary | ICD-10-CM | POA: Diagnosis not present

## 2023-09-11 DIAGNOSIS — E1122 Type 2 diabetes mellitus with diabetic chronic kidney disease: Secondary | ICD-10-CM | POA: Diagnosis not present

## 2023-09-11 DIAGNOSIS — N184 Chronic kidney disease, stage 4 (severe): Secondary | ICD-10-CM | POA: Diagnosis not present

## 2023-09-11 DIAGNOSIS — I480 Paroxysmal atrial fibrillation: Secondary | ICD-10-CM | POA: Diagnosis not present

## 2023-09-11 DIAGNOSIS — I130 Hypertensive heart and chronic kidney disease with heart failure and stage 1 through stage 4 chronic kidney disease, or unspecified chronic kidney disease: Secondary | ICD-10-CM | POA: Diagnosis not present

## 2023-09-11 DIAGNOSIS — K7460 Unspecified cirrhosis of liver: Secondary | ICD-10-CM | POA: Diagnosis not present

## 2023-09-14 DIAGNOSIS — N184 Chronic kidney disease, stage 4 (severe): Secondary | ICD-10-CM | POA: Diagnosis not present

## 2023-09-21 ENCOUNTER — Telehealth: Payer: Self-pay | Admitting: Nurse Practitioner

## 2023-09-21 DIAGNOSIS — I5032 Chronic diastolic (congestive) heart failure: Secondary | ICD-10-CM | POA: Diagnosis not present

## 2023-09-21 DIAGNOSIS — I130 Hypertensive heart and chronic kidney disease with heart failure and stage 1 through stage 4 chronic kidney disease, or unspecified chronic kidney disease: Secondary | ICD-10-CM | POA: Diagnosis not present

## 2023-09-21 DIAGNOSIS — E1122 Type 2 diabetes mellitus with diabetic chronic kidney disease: Secondary | ICD-10-CM | POA: Diagnosis not present

## 2023-09-21 DIAGNOSIS — K7460 Unspecified cirrhosis of liver: Secondary | ICD-10-CM | POA: Diagnosis not present

## 2023-09-21 DIAGNOSIS — N184 Chronic kidney disease, stage 4 (severe): Secondary | ICD-10-CM | POA: Diagnosis not present

## 2023-09-21 DIAGNOSIS — I480 Paroxysmal atrial fibrillation: Secondary | ICD-10-CM | POA: Diagnosis not present

## 2023-09-21 NOTE — Telephone Encounter (Signed)
Home Health verbal orders Caller Name:amy Agency Name: Melony Overly number: [phone]  Requesting Skilled nursing   Reason:extend  Frequency:1 x week for 5 weeks  Please forward to [facility] pool or providers CMA

## 2023-09-24 DIAGNOSIS — I2510 Atherosclerotic heart disease of native coronary artery without angina pectoris: Secondary | ICD-10-CM | POA: Diagnosis not present

## 2023-09-24 DIAGNOSIS — N184 Chronic kidney disease, stage 4 (severe): Secondary | ICD-10-CM | POA: Diagnosis not present

## 2023-09-24 DIAGNOSIS — K7460 Unspecified cirrhosis of liver: Secondary | ICD-10-CM | POA: Diagnosis not present

## 2023-09-24 DIAGNOSIS — Z85048 Personal history of other malignant neoplasm of rectum, rectosigmoid junction, and anus: Secondary | ICD-10-CM | POA: Diagnosis not present

## 2023-09-24 DIAGNOSIS — Z8673 Personal history of transient ischemic attack (TIA), and cerebral infarction without residual deficits: Secondary | ICD-10-CM | POA: Diagnosis not present

## 2023-09-24 DIAGNOSIS — I130 Hypertensive heart and chronic kidney disease with heart failure and stage 1 through stage 4 chronic kidney disease, or unspecified chronic kidney disease: Secondary | ICD-10-CM | POA: Diagnosis not present

## 2023-09-24 DIAGNOSIS — E785 Hyperlipidemia, unspecified: Secondary | ICD-10-CM | POA: Diagnosis not present

## 2023-09-24 DIAGNOSIS — Z7984 Long term (current) use of oral hypoglycemic drugs: Secondary | ICD-10-CM | POA: Diagnosis not present

## 2023-09-24 DIAGNOSIS — Z87891 Personal history of nicotine dependence: Secondary | ICD-10-CM | POA: Diagnosis not present

## 2023-09-24 DIAGNOSIS — E1122 Type 2 diabetes mellitus with diabetic chronic kidney disease: Secondary | ICD-10-CM | POA: Diagnosis not present

## 2023-09-24 DIAGNOSIS — I5032 Chronic diastolic (congestive) heart failure: Secondary | ICD-10-CM | POA: Diagnosis not present

## 2023-09-24 DIAGNOSIS — Z794 Long term (current) use of insulin: Secondary | ICD-10-CM | POA: Diagnosis not present

## 2023-09-24 DIAGNOSIS — Z7901 Long term (current) use of anticoagulants: Secondary | ICD-10-CM | POA: Diagnosis not present

## 2023-09-24 DIAGNOSIS — Z9181 History of falling: Secondary | ICD-10-CM | POA: Diagnosis not present

## 2023-09-24 DIAGNOSIS — I480 Paroxysmal atrial fibrillation: Secondary | ICD-10-CM | POA: Diagnosis not present

## 2023-09-24 DIAGNOSIS — D509 Iron deficiency anemia, unspecified: Secondary | ICD-10-CM | POA: Diagnosis not present

## 2023-09-24 NOTE — Telephone Encounter (Signed)
Contacted amy from Home health to give ok for verbal orders per PCP.  Amy verbalized understanding. Nothing is needed at this time.

## 2023-09-24 NOTE — Telephone Encounter (Signed)
Verbal orders ok

## 2023-09-25 DIAGNOSIS — N184 Chronic kidney disease, stage 4 (severe): Secondary | ICD-10-CM | POA: Diagnosis not present

## 2023-09-25 DIAGNOSIS — E875 Hyperkalemia: Secondary | ICD-10-CM | POA: Diagnosis not present

## 2023-09-25 DIAGNOSIS — E1122 Type 2 diabetes mellitus with diabetic chronic kidney disease: Secondary | ICD-10-CM | POA: Diagnosis not present

## 2023-09-25 DIAGNOSIS — I5030 Unspecified diastolic (congestive) heart failure: Secondary | ICD-10-CM | POA: Diagnosis not present

## 2023-09-25 DIAGNOSIS — I129 Hypertensive chronic kidney disease with stage 1 through stage 4 chronic kidney disease, or unspecified chronic kidney disease: Secondary | ICD-10-CM | POA: Diagnosis not present

## 2023-09-25 DIAGNOSIS — I4891 Unspecified atrial fibrillation: Secondary | ICD-10-CM | POA: Diagnosis not present

## 2023-09-26 DIAGNOSIS — N184 Chronic kidney disease, stage 4 (severe): Secondary | ICD-10-CM | POA: Diagnosis not present

## 2023-09-26 DIAGNOSIS — I5032 Chronic diastolic (congestive) heart failure: Secondary | ICD-10-CM | POA: Diagnosis not present

## 2023-09-26 DIAGNOSIS — I130 Hypertensive heart and chronic kidney disease with heart failure and stage 1 through stage 4 chronic kidney disease, or unspecified chronic kidney disease: Secondary | ICD-10-CM | POA: Diagnosis not present

## 2023-09-26 DIAGNOSIS — E1122 Type 2 diabetes mellitus with diabetic chronic kidney disease: Secondary | ICD-10-CM | POA: Diagnosis not present

## 2023-09-26 DIAGNOSIS — K7460 Unspecified cirrhosis of liver: Secondary | ICD-10-CM | POA: Diagnosis not present

## 2023-09-26 DIAGNOSIS — I480 Paroxysmal atrial fibrillation: Secondary | ICD-10-CM | POA: Diagnosis not present

## 2023-09-27 ENCOUNTER — Telehealth: Payer: Self-pay | Admitting: Nurse Practitioner

## 2023-09-27 DIAGNOSIS — E1122 Type 2 diabetes mellitus with diabetic chronic kidney disease: Secondary | ICD-10-CM | POA: Diagnosis not present

## 2023-09-27 DIAGNOSIS — I480 Paroxysmal atrial fibrillation: Secondary | ICD-10-CM | POA: Diagnosis not present

## 2023-09-27 DIAGNOSIS — K7460 Unspecified cirrhosis of liver: Secondary | ICD-10-CM | POA: Diagnosis not present

## 2023-09-27 DIAGNOSIS — I130 Hypertensive heart and chronic kidney disease with heart failure and stage 1 through stage 4 chronic kidney disease, or unspecified chronic kidney disease: Secondary | ICD-10-CM | POA: Diagnosis not present

## 2023-09-27 DIAGNOSIS — N184 Chronic kidney disease, stage 4 (severe): Secondary | ICD-10-CM | POA: Diagnosis not present

## 2023-09-27 DIAGNOSIS — I5032 Chronic diastolic (congestive) heart failure: Secondary | ICD-10-CM | POA: Diagnosis not present

## 2023-09-27 NOTE — Telephone Encounter (Signed)
Contacted pt. States that he is doing fine and declines the need to be seen by provider. No further questions or concerns.

## 2023-09-27 NOTE — Telephone Encounter (Signed)
Order for wound care is fine. Can we call and check on the patient and make sure he does not need to be seen by a provider

## 2023-09-27 NOTE — Telephone Encounter (Signed)
[name] from [facility] called to report a fall the pt had this morning, 12/12, @ 3am. [name] states the pt explained how he was using the bathroom, afterwards, he just fell. [name] states the pt says he isn't sure what caused the fall, he just fell down. [name] states the pt has two skin tears, one of left forearm, other on right knee. [name] states she is going to need a order for wound care, that she'll send over to our office. [name] states the pt was bleeding a lot when she visited him today, so she dressed the pt's wounds. Call back # [phone], secured.

## 2023-10-02 ENCOUNTER — Telehealth: Payer: Self-pay

## 2023-10-02 NOTE — Telephone Encounter (Signed)
Attempted to contact patient to discuss ongoing patience assistance application for Eliquis. Left HIPAA compliant message for patient to return my call at their convenience.    PLEASE BE ADVISED

## 2023-10-04 ENCOUNTER — Other Ambulatory Visit: Payer: Self-pay | Admitting: Nurse Practitioner

## 2023-10-04 DIAGNOSIS — E782 Mixed hyperlipidemia: Secondary | ICD-10-CM

## 2023-10-05 ENCOUNTER — Telehealth: Payer: Self-pay | Admitting: Nurse Practitioner

## 2023-10-05 DIAGNOSIS — I130 Hypertensive heart and chronic kidney disease with heart failure and stage 1 through stage 4 chronic kidney disease, or unspecified chronic kidney disease: Secondary | ICD-10-CM | POA: Diagnosis not present

## 2023-10-05 DIAGNOSIS — K7460 Unspecified cirrhosis of liver: Secondary | ICD-10-CM | POA: Diagnosis not present

## 2023-10-05 DIAGNOSIS — E1122 Type 2 diabetes mellitus with diabetic chronic kidney disease: Secondary | ICD-10-CM | POA: Diagnosis not present

## 2023-10-05 DIAGNOSIS — N184 Chronic kidney disease, stage 4 (severe): Secondary | ICD-10-CM | POA: Diagnosis not present

## 2023-10-05 DIAGNOSIS — I5032 Chronic diastolic (congestive) heart failure: Secondary | ICD-10-CM | POA: Diagnosis not present

## 2023-10-05 DIAGNOSIS — I480 Paroxysmal atrial fibrillation: Secondary | ICD-10-CM | POA: Diagnosis not present

## 2023-10-05 NOTE — Telephone Encounter (Signed)
Copied from CRM [id number]. Topic: General - Other >> Oct 05, 2023 11:52 AM Denese Killings wrote: Reason for CRM: Alex with [facility] is calling to get Patient last visit notes. Fax [phone]  Received CRM, pharmacy is needing LOV notes

## 2023-10-05 NOTE — Telephone Encounter (Signed)
This call was a scam. Phone number does not come up as [facility]. [facility] [address]. No further action is needed.

## 2023-10-08 ENCOUNTER — Ambulatory Visit: Payer: Self-pay

## 2023-10-08 DIAGNOSIS — K7460 Unspecified cirrhosis of liver: Secondary | ICD-10-CM | POA: Diagnosis not present

## 2023-10-08 DIAGNOSIS — I130 Hypertensive heart and chronic kidney disease with heart failure and stage 1 through stage 4 chronic kidney disease, or unspecified chronic kidney disease: Secondary | ICD-10-CM | POA: Diagnosis not present

## 2023-10-08 DIAGNOSIS — E1122 Type 2 diabetes mellitus with diabetic chronic kidney disease: Secondary | ICD-10-CM | POA: Diagnosis not present

## 2023-10-08 DIAGNOSIS — I480 Paroxysmal atrial fibrillation: Secondary | ICD-10-CM | POA: Diagnosis not present

## 2023-10-08 DIAGNOSIS — N184 Chronic kidney disease, stage 4 (severe): Secondary | ICD-10-CM | POA: Diagnosis not present

## 2023-10-08 DIAGNOSIS — I5032 Chronic diastolic (congestive) heart failure: Secondary | ICD-10-CM | POA: Diagnosis not present

## 2023-10-08 NOTE — Patient Instructions (Signed)
Visit Information  Thank you for taking time to visit with me today. Please don't hesitate to contact me if I can be of assistance to you.   Following are the goals we discussed today:   Goals Addressed             This Visit's Progress    Patient stated: decrease blood pressure and post hospital/SNF discharge follow up       Interventions Today    Flowsheet Row Most Recent Value  Chronic Disease   Chronic disease during today's visit Diabetes, Congestive Heart Failure (CHF), Hypertension (HTN), Other  [falls.]  General Interventions   General Interventions Discussed/Reviewed General Interventions Reviewed, Doctor Visits  [evaluation of current treatment plan for mentioned health conditions and patients adherence to plan as advised by provider. Assessed for BS, BP, heart failure symptoms and falls.]  Doctor Visits Discussed/Reviewed Doctor Visits Reviewed  [reviewed upcoming provider visits. Blood pressure readings sent to patients primary care provider.]  Education Interventions   Education Provided Provided Education  [reviewed heart failure symptoms. Advised to follow up with provider for mild/ moderate symptoms and call 911 for severe symptoms.]  Provided Verbal Education On When to see the doctor  [Advised patient to take his blood pressure and blood sugar readings to upcoming primary provider visit on 10/12/23. confirmed patient still receiving home health PT services.]  Nutrition Interventions   Nutrition Discussed/Reviewed Nutrition Reviewed, Decreasing salt  Pharmacy Interventions   Pharmacy Dicussed/Reviewed Pharmacy Topics Reviewed  [medications reviewed and compliance discussed and advised.]  Safety Interventions   Safety Discussed/Reviewed Fall Risk  [Assessed for falls.]                    Our next appointment is by telephone on 11/06/23 at 2 pm  Please call the care guide team at [phone] if you need to cancel or reschedule your appointment.   If you  are experiencing a Mental Health or Behavioral Health Crisis or need someone to talk to, please call the Suicide and Crisis Lifeline: 988 call 1-800-273-TALK (toll free, 24 hour hotline)  Patient verbalizes understanding of instructions and care plan provided today and agrees to view in MyChart. Active MyChart status and patient understanding of how to access instructions and care plan via MyChart confirmed with patient.     [name] RN,BSN,CCM [place]  [facility], [facility] coordinator / Case Manager Phone: [phone]

## 2023-10-08 NOTE — Patient Outreach (Signed)
  Care Coordination   Follow Up Visit Note   10/08/2023 Name: Jake Gross MRN: [id number] DOB: [birth year]  Jake Gross is a 82 y.o. year old male who sees Cable, Genene Churn, NP for primary care. I spoke with  [name] by phone today.  What matters to the patients health and wellness today?  Patient states he continues to have home health services. He reports blood sugar today was 133.  He reports blood sugar range hs been 70-150's.  Patient denies blood sugars <70.   Patient denies any increase in HF symptoms.  He states his blood pressures have been pretty high with readings of:  168/74, 180/78, 147/73, 169/95, 176/80, 207/90, 157/80, 167/81.  Patient states he is scheduled to see his primary care provider on 10/12/23.     Goals Addressed             This Visit's Progress    Patient stated: decrease blood pressure and post hospital/SNF discharge follow up       Interventions Today    Flowsheet Row Most Recent Value  Chronic Disease   Chronic disease during today's visit Diabetes, Congestive Heart Failure (CHF), Hypertension (HTN), Other  [falls.]  General Interventions   General Interventions Discussed/Reviewed General Interventions Reviewed, Doctor Visits  [evaluation of current treatment plan for mentioned health conditions and patients adherence to plan as advised by provider. Assessed for BS, BP, heart failure symptoms and falls.]  Doctor Visits Discussed/Reviewed Doctor Visits Reviewed  [reviewed upcoming provider visits. Blood pressure readings sent to patients primary care provider.]  Education Interventions   Education Provided Provided Education  [reviewed heart failure symptoms. Advised to follow up with provider for mild/ moderate symptoms and call 911 for severe symptoms.]  Provided Verbal Education On When to see the doctor  [Advised patient to take his blood pressure and blood sugar readings to upcoming primary provider visit on 10/12/23. confirmed patient  still receiving home health PT services.]  Nutrition Interventions   Nutrition Discussed/Reviewed Nutrition Reviewed, Decreasing salt  Pharmacy Interventions   Pharmacy Dicussed/Reviewed Pharmacy Topics Reviewed  [medications reviewed and compliance discussed and advised.]  Safety Interventions   Safety Discussed/Reviewed Fall Risk  [Assessed for falls.]                    SDOH assessments and interventions completed:  No     Care Coordination Interventions:  Yes, provided   Follow up plan: Follow up call scheduled for 11/06/23    Encounter Outcome:  Patient Visit Completed   [name] RN,BSN,CCM [facility]  [facility], [facility] coordinator / Case Manager Phone: [phone]

## 2023-10-12 ENCOUNTER — Encounter: Payer: Self-pay | Admitting: Nurse Practitioner

## 2023-10-12 ENCOUNTER — Ambulatory Visit (INDEPENDENT_AMBULATORY_CARE_PROVIDER_SITE_OTHER): Payer: Medicare Other | Admitting: Nurse Practitioner

## 2023-10-12 ENCOUNTER — Ambulatory Visit (INDEPENDENT_AMBULATORY_CARE_PROVIDER_SITE_OTHER)
Admission: RE | Admit: 2023-10-12 | Discharge: 2023-10-12 | Disposition: A | Discharge status: Home / Self Care | Payer: Medicare Other | Source: Ambulatory Visit | Attending: Nurse Practitioner | Admitting: Nurse Practitioner

## 2023-10-12 VITALS — BP 134/70 | HR 79 | Temp 97.6°F | Ht 67.0 in | Wt 178.2 lb

## 2023-10-12 DIAGNOSIS — E119 Type 2 diabetes mellitus without complications: Secondary | ICD-10-CM | POA: Diagnosis not present

## 2023-10-12 DIAGNOSIS — I5032 Chronic diastolic (congestive) heart failure: Secondary | ICD-10-CM | POA: Diagnosis not present

## 2023-10-12 DIAGNOSIS — E1122 Type 2 diabetes mellitus with diabetic chronic kidney disease: Secondary | ICD-10-CM | POA: Diagnosis not present

## 2023-10-12 DIAGNOSIS — N184 Chronic kidney disease, stage 4 (severe): Secondary | ICD-10-CM | POA: Diagnosis not present

## 2023-10-12 DIAGNOSIS — I480 Paroxysmal atrial fibrillation: Secondary | ICD-10-CM | POA: Diagnosis not present

## 2023-10-12 DIAGNOSIS — I130 Hypertensive heart and chronic kidney disease with heart failure and stage 1 through stage 4 chronic kidney disease, or unspecified chronic kidney disease: Secondary | ICD-10-CM | POA: Diagnosis not present

## 2023-10-12 DIAGNOSIS — I10 Essential (primary) hypertension: Secondary | ICD-10-CM | POA: Diagnosis not present

## 2023-10-12 DIAGNOSIS — M25551 Pain in right hip: Secondary | ICD-10-CM | POA: Insufficient documentation

## 2023-10-12 DIAGNOSIS — K7460 Unspecified cirrhosis of liver: Secondary | ICD-10-CM | POA: Diagnosis not present

## 2023-10-12 DIAGNOSIS — Z794 Long term (current) use of insulin: Secondary | ICD-10-CM

## 2023-10-12 LAB — POCT GLYCOSYLATED HEMOGLOBIN (HGB A1C): Hemoglobin A1C: 5.5 % (ref 4.0–5.6)

## 2023-10-12 NOTE — Assessment & Plan Note (Signed)
Patient states has been told he has arthritis in the past no acute injury.  Obtain film of hip.  Pending result.  Patient can do Tylenol 1000 mg every 8 hours as needed or 650 mg every 6 hours as needed.  Avoid NSAIDs due to renal function.  Patient use topical medications as needed

## 2023-10-12 NOTE — Assessment & Plan Note (Signed)
Patient currently on 30 units of Lantus at bedtime.  A1c 5.5%.  Continue Lantus as prescribed.  Checking glucose at least daily.

## 2023-10-12 NOTE — Patient Instructions (Signed)
Nice to see you today Your A1C is 5.5% today I will be in touch with the hip xray once I have reviewed it Follow up in 3 months, sooner if you need me

## 2023-10-12 NOTE — Assessment & Plan Note (Signed)
Patient currently maintained on amlodipine 10 mg, hydralazine 100 mg, chlorthalidone 25 mg, isosorbide mononitrate 60 mg daily.  Blood pressure well-controlled in office.  Continue checking blood pressure at home and taking medication as prescribed

## 2023-10-12 NOTE — Progress Notes (Signed)
Established Patient Office Visit  Subjective   Patient ID: Jake Gross, male    DOB: [birth year]  Age: 82 y.o. MRN: [id number]  Chief Complaint  Patient presents with   Diabetes    Pt states to be in pain from hip.       DM2: patient is currently maintained on farxiga, and lantus 30 units daily. He is cheking glucose daily patient said no low glucose readings lowest 1 was 90 on as she patient has had some in the mid to upper 100s to low 200s.  Homehealth: twice a week with SN and PT   Hip pain: right sided hip pain. State that this morning it was a 10/10. States that he knows he has arthritis. 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 States some weakness. No numbness  or B&B involvement. States that he had a fall in the bathroom but hip has been bothering him before them. States that the hip gave way  Htn: he is currenlty on amlodipine 10 mg, chlorthalidone 25 mg, hydralazine 100 mg, isosorbide mononitrate, torsemide.  Patient does check blood pressures at home daily patient brought in a list which will be scanned to chart.  Blood pressures are labile some in goal some outside of goal.  Patient denies any lightheadedness or dizziness.    Review of Systems  Constitutional:  Negative for chills and fever.  Respiratory:  Negative for shortness of breath.   Cardiovascular:  Negative for chest pain.  Musculoskeletal:  Positive for joint pain.  Neurological:  Negative for dizziness and headaches.  Psychiatric/Behavioral:  Negative for hallucinations and suicidal ideas.       Objective:     BP 134/70   Pulse 79   Temp 97.6 F (36.4 C) (Oral)   Ht 5\' 7"  (1.702 m)   Wt 178 lb 3.2 oz (80.8 kg)   SpO2 98%   BMI 27.91 kg/m    Physical Exam Vitals and nursing note reviewed.  Constitutional:      Appearance: Normal appearance.  Cardiovascular:     Rate and Rhythm: Normal rate and regular rhythm.     Pulses:          Dorsalis pedis pulses are 2+ on the  right side and 2+ on the left side.       Posterior tibial pulses are 2+ on the right side and 2+ on the left side.     Heart sounds: Normal heart sounds.  Pulmonary:     Effort: Pulmonary effort is normal.     Breath sounds: Normal breath sounds.  Musculoskeletal:        General: Tenderness present.     Lumbar back: Tenderness present. Negative right straight leg raise test and negative left straight leg raise test.       Back:     Right hip: Tenderness and bony tenderness present. Normal range of motion. Normal strength.     Right lower leg: Edema (R>L) present.     Left lower leg: Edema present.  Feet:     Right foot:     Skin integrity: Dry skin present.     Toenail Condition: Fungal disease present.    Left foot:     Skin integrity: Dry skin present.     Toenail Condition: Fungal disease present. Neurological:     Mental Status: He is alert.     Deep Tendon Reflexes:     Reflex Scores:  Patellar reflexes are 2+ on the right side and 2+ on the left side.    Comments: Bilateral lower extremity strength 5/5.      Results for orders placed or performed in visit on 10/12/23  POCT glycosylated hemoglobin (Hb A1C)  Result Value Ref Range   Hemoglobin A1C 5.5 4.0 - 5.6 %   HbA1c POC (<> result, manual entry)     HbA1c, POC (prediabetic range)     HbA1c, POC (controlled diabetic range)        The ASCVD Risk score (Arnett DK, et al., 2019) failed to calculate for the following reasons:   The 2019 ASCVD risk score is only valid for ages 80 to 63   Risk score cannot be calculated because patient has a medical history suggesting prior/existing ASCVD    Assessment & Plan:   Problem List Items Addressed This Visit       Cardiovascular and Mediastinum   Essential hypertension (Chronic)   Patient currently maintained on amlodipine 10 mg, hydralazine 100 mg, chlorthalidone 25 mg, isosorbide mononitrate 60 mg daily.  Blood pressure well-controlled in office.  Continue  checking blood pressure at home and taking medication as prescribed      Relevant Medications   chlorthalidone (HYGROTON) 25 MG tablet   Other Relevant Orders   Comprehensive metabolic panel   CBC     Endocrine   Diabetes mellitus type 2, insulin dependent (HCC) - Primary   Patient currently on 30 units of Lantus at bedtime.  A1c 5.5%.  Continue Lantus as prescribed.  Checking glucose at least daily.      Relevant Orders   POCT glycosylated hemoglobin (Hb A1C) (Completed)   Comprehensive metabolic panel   CBC     Other   Right hip pain   Patient states has been told he has arthritis in the past no acute injury.  Obtain film of hip.  Pending result.  Patient can do Tylenol 1000 mg every 8 hours as needed or 650 mg every 6 hours as needed.  Avoid NSAIDs due to renal function.  Patient use topical medications as needed      Relevant Orders   DG HIP UNILAT WITH PELVIS 2-3 VIEWS RIGHT (Completed)    Return in about 3 months (around 01/10/2024) for DM recheck.    Audria Nine, NP

## 2023-10-13 ENCOUNTER — Other Ambulatory Visit: Payer: Self-pay | Admitting: Cardiovascular Disease

## 2023-10-13 LAB — COMPREHENSIVE METABOLIC PANEL
AG Ratio: 1.1 (calc) (ref 1.0–2.5)
ALT: 11 U/L (ref 9–46)
AST: 17 U/L (ref 10–35)
Albumin: 3.6 g/dL (ref 3.6–5.1)
Alkaline phosphatase (APISO): 114 U/L (ref 35–144)
BUN/Creatinine Ratio: 22 (calc) (ref 6–22)
BUN: 51 mg/dL — ABNORMAL HIGH (ref 7–25)
CO2: 23 mmol/L (ref 20–32)
Calcium: 8.9 mg/dL (ref 8.6–10.3)
Chloride: 105 mmol/L (ref 98–110)
Creat: 2.3 mg/dL — ABNORMAL HIGH (ref 0.70–1.22)
Globulin: 3.4 g/dL (ref 1.9–3.7)
Glucose, Bld: 95 mg/dL (ref 65–99)
Potassium: 3.7 mmol/L (ref 3.5–5.3)
Sodium: 142 mmol/L (ref 135–146)
Total Bilirubin: 0.7 mg/dL (ref 0.2–1.2)
Total Protein: 7 g/dL (ref 6.1–8.1)

## 2023-10-13 LAB — CBC
HCT: 37.8 % — ABNORMAL LOW (ref 38.5–50.0)
Hemoglobin: 12 g/dL — ABNORMAL LOW (ref 13.2–17.1)
MCH: 26.8 pg — ABNORMAL LOW (ref 27.0–33.0)
MCHC: 31.7 g/dL — ABNORMAL LOW (ref 32.0–36.0)
MCV: 84.4 fL (ref 80.0–100.0)
MPV: 10 fL (ref 7.5–12.5)
Platelets: 292 10*3/uL (ref 140–400)
RBC: 4.48 10*6/uL (ref 4.20–5.80)
RDW: 15.1 % — ABNORMAL HIGH (ref 11.0–15.0)
WBC: 8.2 10*3/uL (ref 3.8–10.8)

## 2023-10-16 ENCOUNTER — Telehealth: Payer: Self-pay

## 2023-10-16 MED ORDER — LANTUS SOLOSTAR 100 UNIT/ML ~~LOC~~ SOPN: 30.0000 [IU] | PEN_INJECTOR | Freq: Every day | SUBCUTANEOUS | 1 refills | Status: DC

## 2023-10-16 NOTE — Telephone Encounter (Signed)
 Copied from CRM [id number]. Topic: Clinical - Prescription Issue >> Oct 16, 2023 12:23 PM Drema MATSU wrote: Reason for CRM: [facility] that patient advised him that he dropped off a prescription for his Lantus  and Pharmacy doesn't have it. Pharmacy needs a verbal

## 2023-10-16 NOTE — Telephone Encounter (Signed)
I do not see a new rx for Lantus. Will forward to Audria Nine, NP to refill.

## 2023-10-17 DIAGNOSIS — N184 Chronic kidney disease, stage 4 (severe): Secondary | ICD-10-CM | POA: Diagnosis not present

## 2023-10-17 DIAGNOSIS — I5032 Chronic diastolic (congestive) heart failure: Secondary | ICD-10-CM | POA: Diagnosis not present

## 2023-10-17 DIAGNOSIS — I480 Paroxysmal atrial fibrillation: Secondary | ICD-10-CM | POA: Diagnosis not present

## 2023-10-17 DIAGNOSIS — K7460 Unspecified cirrhosis of liver: Secondary | ICD-10-CM | POA: Diagnosis not present

## 2023-10-17 DIAGNOSIS — E1122 Type 2 diabetes mellitus with diabetic chronic kidney disease: Secondary | ICD-10-CM | POA: Diagnosis not present

## 2023-10-17 DIAGNOSIS — I130 Hypertensive heart and chronic kidney disease with heart failure and stage 1 through stage 4 chronic kidney disease, or unspecified chronic kidney disease: Secondary | ICD-10-CM | POA: Diagnosis not present

## 2023-10-18 ENCOUNTER — Telehealth: Payer: Self-pay | Admitting: Nurse Practitioner

## 2023-10-18 ENCOUNTER — Other Ambulatory Visit: Payer: Self-pay | Admitting: Nurse Practitioner

## 2023-10-18 DIAGNOSIS — M25551 Pain in right hip: Secondary | ICD-10-CM

## 2023-10-18 DIAGNOSIS — N289 Disorder of kidney and ureter, unspecified: Secondary | ICD-10-CM

## 2023-10-18 NOTE — Telephone Encounter (Signed)
-----   Message from [facility] T sent at 10/18/2023  1:42 PM EST ----- Called patient reviewed all information and repeated back to me.  Scheduled pt for lab visit 1/9 to recheck functions.  Pt prefers [facility] location for ortho.

## 2023-10-18 NOTE — Telephone Encounter (Signed)
 Referral placed.

## 2023-10-20 DIAGNOSIS — E1122 Type 2 diabetes mellitus with diabetic chronic kidney disease: Secondary | ICD-10-CM | POA: Diagnosis not present

## 2023-10-20 DIAGNOSIS — I5032 Chronic diastolic (congestive) heart failure: Secondary | ICD-10-CM | POA: Diagnosis not present

## 2023-10-20 DIAGNOSIS — I130 Hypertensive heart and chronic kidney disease with heart failure and stage 1 through stage 4 chronic kidney disease, or unspecified chronic kidney disease: Secondary | ICD-10-CM | POA: Diagnosis not present

## 2023-10-20 DIAGNOSIS — N184 Chronic kidney disease, stage 4 (severe): Secondary | ICD-10-CM | POA: Diagnosis not present

## 2023-10-20 DIAGNOSIS — I480 Paroxysmal atrial fibrillation: Secondary | ICD-10-CM | POA: Diagnosis not present

## 2023-10-20 DIAGNOSIS — K7460 Unspecified cirrhosis of liver: Secondary | ICD-10-CM | POA: Diagnosis not present

## 2023-10-22 ENCOUNTER — Telehealth: Payer: Self-pay

## 2023-10-22 DIAGNOSIS — N184 Chronic kidney disease, stage 4 (severe): Secondary | ICD-10-CM | POA: Diagnosis not present

## 2023-10-22 DIAGNOSIS — I5032 Chronic diastolic (congestive) heart failure: Secondary | ICD-10-CM | POA: Diagnosis not present

## 2023-10-22 DIAGNOSIS — E1122 Type 2 diabetes mellitus with diabetic chronic kidney disease: Secondary | ICD-10-CM | POA: Diagnosis not present

## 2023-10-22 DIAGNOSIS — K7460 Unspecified cirrhosis of liver: Secondary | ICD-10-CM | POA: Diagnosis not present

## 2023-10-22 DIAGNOSIS — I480 Paroxysmal atrial fibrillation: Secondary | ICD-10-CM | POA: Diagnosis not present

## 2023-10-22 DIAGNOSIS — I130 Hypertensive heart and chronic kidney disease with heart failure and stage 1 through stage 4 chronic kidney disease, or unspecified chronic kidney disease: Secondary | ICD-10-CM | POA: Diagnosis not present

## 2023-10-22 NOTE — Telephone Encounter (Signed)
 Verbal orders ok  He can take 1,000mg  of tylenol twice a day that is fine  If he want to spread it out he can do 650mg  of tylenol every 8 hours instead

## 2023-10-22 NOTE — Telephone Encounter (Signed)
 Copied from CRM [id number]. Topic: Clinical - Home Health Verbal Orders >> Oct 22, 2023  4:30 PM Brittany M wrote: Reason for CRM:  Caller/Agency: [name] RN [facility] Callback Number: [phone] Service Requested: Nursing Frequency: 1 time a week for 5 weeks to start. Due to hypertension Congested heart failure and Pain in left hip Any new concerns about the patient? Yes Dr told him that he could do 2 extra strength tylenol  a day... the Nurse wants to know if she can take them twice a day for his pain, it is an 8/10 ??

## 2023-10-23 ENCOUNTER — Encounter: Payer: Self-pay | Admitting: Nurse Practitioner

## 2023-10-23 ENCOUNTER — Telehealth: Payer: Self-pay

## 2023-10-23 DIAGNOSIS — N184 Chronic kidney disease, stage 4 (severe): Secondary | ICD-10-CM | POA: Diagnosis not present

## 2023-10-23 NOTE — Telephone Encounter (Signed)
 Copied from CRM [id number]. Topic: Clinical - Home Health Verbal Orders >> Oct 23, 2023  9:59 AM Alfonso ORN wrote: Caller/Agency: [facility]  Callback Number: [phone] Service Requested: physical therapy , gait training for walking and balance  Frequency: once a week for 9 weeks beginning 10/24/2023 Any new concerns about the patient? No , provider already aware of situation having hip pain

## 2023-10-23 NOTE — Telephone Encounter (Signed)
 Blood pressure was normal in office when I saw him. He has been referred to ortho because of the hip pain. Also when she reports high blood pressure can we get the numbers please

## 2023-10-23 NOTE — Telephone Encounter (Signed)
 Duplicate. Responeded to other encounter

## 2023-10-23 NOTE — Telephone Encounter (Signed)
 Contacted [name] from [facility].  Relayed instructions to nurse and advised her that it was okay to continue verbal orders per PCP. [name] complains of pt BP still being high. States its likely due to the pain.

## 2023-10-23 NOTE — Telephone Encounter (Signed)
 Verbal orders are ok. When patient was in office he states that he stopped PT due to it increasing hip pain.

## 2023-10-24 DIAGNOSIS — Z8673 Personal history of transient ischemic attack (TIA), and cerebral infarction without residual deficits: Secondary | ICD-10-CM | POA: Diagnosis not present

## 2023-10-24 DIAGNOSIS — N184 Chronic kidney disease, stage 4 (severe): Secondary | ICD-10-CM | POA: Diagnosis not present

## 2023-10-24 DIAGNOSIS — D509 Iron deficiency anemia, unspecified: Secondary | ICD-10-CM | POA: Diagnosis not present

## 2023-10-24 DIAGNOSIS — Z7901 Long term (current) use of anticoagulants: Secondary | ICD-10-CM | POA: Diagnosis not present

## 2023-10-24 DIAGNOSIS — Z85048 Personal history of other malignant neoplasm of rectum, rectosigmoid junction, and anus: Secondary | ICD-10-CM | POA: Diagnosis not present

## 2023-10-24 DIAGNOSIS — Z7984 Long term (current) use of oral hypoglycemic drugs: Secondary | ICD-10-CM | POA: Diagnosis not present

## 2023-10-24 DIAGNOSIS — Z87891 Personal history of nicotine dependence: Secondary | ICD-10-CM | POA: Diagnosis not present

## 2023-10-24 DIAGNOSIS — K7460 Unspecified cirrhosis of liver: Secondary | ICD-10-CM | POA: Diagnosis not present

## 2023-10-24 DIAGNOSIS — E785 Hyperlipidemia, unspecified: Secondary | ICD-10-CM | POA: Diagnosis not present

## 2023-10-24 DIAGNOSIS — I480 Paroxysmal atrial fibrillation: Secondary | ICD-10-CM | POA: Diagnosis not present

## 2023-10-24 DIAGNOSIS — E1122 Type 2 diabetes mellitus with diabetic chronic kidney disease: Secondary | ICD-10-CM | POA: Diagnosis not present

## 2023-10-24 DIAGNOSIS — Z794 Long term (current) use of insulin: Secondary | ICD-10-CM | POA: Diagnosis not present

## 2023-10-24 DIAGNOSIS — I130 Hypertensive heart and chronic kidney disease with heart failure and stage 1 through stage 4 chronic kidney disease, or unspecified chronic kidney disease: Secondary | ICD-10-CM | POA: Diagnosis not present

## 2023-10-24 DIAGNOSIS — I5032 Chronic diastolic (congestive) heart failure: Secondary | ICD-10-CM | POA: Diagnosis not present

## 2023-10-24 DIAGNOSIS — Z9181 History of falling: Secondary | ICD-10-CM | POA: Diagnosis not present

## 2023-10-24 DIAGNOSIS — R296 Repeated falls: Secondary | ICD-10-CM | POA: Diagnosis not present

## 2023-10-24 DIAGNOSIS — I2510 Atherosclerotic heart disease of native coronary artery without angina pectoris: Secondary | ICD-10-CM | POA: Diagnosis not present

## 2023-10-24 NOTE — Telephone Encounter (Signed)
 Verbal orders called in to [facility]

## 2023-10-24 NOTE — Telephone Encounter (Signed)
 Spoke with Jake Gross about blood pressure reading and ortho referral. She is going to make sure to give the numbers next time she calls in; she is going to see if she can get some faxed over to [place] as well.

## 2023-10-25 ENCOUNTER — Other Ambulatory Visit: Payer: Medicare Other

## 2023-10-25 DIAGNOSIS — N184 Chronic kidney disease, stage 4 (severe): Secondary | ICD-10-CM | POA: Diagnosis not present

## 2023-10-25 DIAGNOSIS — I130 Hypertensive heart and chronic kidney disease with heart failure and stage 1 through stage 4 chronic kidney disease, or unspecified chronic kidney disease: Secondary | ICD-10-CM | POA: Diagnosis not present

## 2023-10-25 DIAGNOSIS — K7460 Unspecified cirrhosis of liver: Secondary | ICD-10-CM | POA: Diagnosis not present

## 2023-10-25 DIAGNOSIS — I5032 Chronic diastolic (congestive) heart failure: Secondary | ICD-10-CM | POA: Diagnosis not present

## 2023-10-25 DIAGNOSIS — I480 Paroxysmal atrial fibrillation: Secondary | ICD-10-CM | POA: Diagnosis not present

## 2023-10-25 DIAGNOSIS — E1122 Type 2 diabetes mellitus with diabetic chronic kidney disease: Secondary | ICD-10-CM | POA: Diagnosis not present

## 2023-10-29 NOTE — Progress Notes (Signed)
 Office Visit Note   Patient: Jake Gross           Date of Birth: May 31, 1941           MRN: [id number] Visit Date: 10/30/2023              Requested by: Jake Lynwood HERO, NP [address] [place],  [place] [address] PCP: Jake Lynwood HERO, NP   Assessment & Plan: Visit Diagnoses:  1. Pain in right hip     Plan: Jake Gross is a 83 year old gentleman with trochanteric right hip pain.  He has been hospitalized numerous times last year and this is likely because of significant amount of deconditioning.  He is currently getting physical therapy at home for general strengthening.  Treatment options explained that he would like to try prednisone  Dosepak.  Patient is on Eliquis  and has stage IV chronic kidney disease so NSAIDs are not an option.  Follow-up if symptoms persist if he decides he wants to try cortisone injection.  X-rays in PACS are unremarkable of the hip joint.  Follow-Up Instructions: No follow-ups on file.   Orders:  No orders of the defined types were placed in this encounter.  Meds ordered this encounter  Medications   predniSONE  (STERAPRED UNI-PAK 21 TAB) 10 MG (21) TBPK tablet    Sig: Take as directed    Dispense:  21 tablet    Refill:  3      Procedures: No procedures performed   Clinical Data: No additional findings.   Subjective: Chief Complaint  Patient presents with   Right Hip - Pain    HPI Patient comes in today for evaluation of chronic right hip pain.  Here with his daughter. Review of Systems  Constitutional: Negative.   HENT: Negative.    Eyes: Negative.   Respiratory: Negative.    Cardiovascular: Negative.   Gastrointestinal: Negative.   Endocrine: Negative.   Genitourinary: Negative.   Skin: Negative.   Allergic/Immunologic: Negative.   Neurological: Negative.   Hematological: Negative.   Psychiatric/Behavioral: Negative.    All other systems reviewed and are negative.    Objective: Vital Signs: There were no vitals taken  for this visit.  Physical Exam Vitals and nursing note reviewed.  Constitutional:      Appearance: He is well-developed.  HENT:     Head: Normocephalic and atraumatic.  Eyes:     Pupils: Pupils are equal, round, and reactive to light.  Pulmonary:     Effort: Pulmonary effort is normal.  Abdominal:     Palpations: Abdomen is soft.  Musculoskeletal:        General: Normal range of motion.     Cervical back: Neck supple.  Skin:    General: Skin is warm.  Neurological:     Mental Status: He is alert and oriented to person, place, and time.  Psychiatric:        Behavior: Behavior normal.        Thought Content: Thought content normal.        Judgment: Judgment normal.    Ortho Exam Examination of the right hip shows tenderness along the trochanteric region.  No groin pain.  Antalgic gait with single-point cane. Specialty Comments:  No specialty comments available.  Imaging: No results found.   PMFS History: Patient Active Problem List   Diagnosis Date Noted   Right hip pain 10/12/2023   Enlarged and hypertrophic nails 07/13/2023   Hospital discharge follow-up 03/05/2023   Acute-on-chronic kidney  injury (HCC) 02/01/2023   AKI (acute kidney injury) (HCC) 01/25/2023   Acute renal failure 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 (HCC) 09/28/2022   Elevated troponin 09/28/2022   Lactic acidosis 09/28/2022   Prolonged QT interval 09/28/2022   Medicare annual wellness visit, subsequent 10/29/2021   Abnormal SPEP 09/12/2021   Dyspnea 06/27/2021   Abscess 06/27/2021   History of anemia 06/27/2021   Nocturia 06/27/2021   Benign hypertensive kidney disease with chronic kidney disease 12/22/2019   Proteinuria 12/22/2019   Stage 3b chronic kidney disease (HCC) 12/22/2019   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   Hypokalemia 12/22/2019   History of GI bleed 09/29/2019   CAD (coronary artery disease) 09/29/2019   AVM  (arteriovenous malformation) of small bowel, 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   Iron deficiency anemia due to chronic blood loss    Chronic anticoagulation    History of CVA (cerebrovascular accident) 08/25/2019   (HFpEF) heart failure with preserved ejection fraction (HCC) 12/17/2018   Paroxysmal atrial fibrillation (HCC) 12/17/2018   CKD (chronic kidney disease) 12/17/2018   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   Unstable angina (HCC)    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   HLD (hyperlipidemia) 09/14/2015   Essential hypertension    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    Arthritis 09/16/2012   Diabetes mellitus type 2, insulin  dependent (HCC) 08/18/2011   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    Arthritis    AVM (arteriovenous malformation) of small bowel, 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   CAD (coronary artery disease) 09/29/2019   Cervical spondylosis    Chronic anticoagulation    Chronic diastolic CHF (congestive heart failure) (HCC) 12/17/2018   CKD (chronic kidney disease) 12/17/2018   Diabetes mellitus    History of GI bleed 09/29/2019   Hyperlipidemia    Hypertension    Iron deficiency anemia due to chronic blood loss    Paroxysmal atrial fibrillation (HCC) 12/17/2018   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 NO ISCHEMIA   COLONOSCOPY W/ POLYPECTOMY     5 polyps   COLONOSCOPY WITH PROPOFOL  N/A 08/29/2019   Procedure: COLONOSCOPY WITH PROPOFOL ;  Surgeon: Jake Victory LITTIE DOUGLAS, MD;  Location: [facility] ENDOSCOPY;  Service: Gastroenterology;  Laterality: N/A;   CORONARY ARTERY BYPASS GRAFT N/A 11/26/2018   Procedure: CORONARY ARTERY BYPASS GRAFTING (CABG), ON PUMP, TIMES FOUR, USING LEFT INTERNAL MAMMARY ARTERY AND ENDOSCOPICALLY HARVESTED LEFT SAPHENOUS VEIN;  Surgeon: Lucas Dorise POUR, MD;  Location: [facility] OR;  Service: Open Heart Surgery;  Laterality: N/A;   ESOPHAGOGASTRODUODENOSCOPY (EGD) WITH PROPOFOL  N/A 08/29/2019   Procedure:  ESOPHAGOGASTRODUODENOSCOPY (EGD) WITH PROPOFOL ;  Surgeon: Jake Victory LITTIE DOUGLAS, MD;  Location[facility] ENDOSCOPY;  Service: Gastroenterology;  Laterality: N/A;   EUS N/A 03/11/2015   Procedure: LOWER ENDOSCOPIC ULTRASOUND (EUS);  Surgeon: Toribio SHAUNNA Cedar, MD;  Location: [facility] ENDOSCOPY;  Service: Endoscopy;  Laterality: N/A;   FLEXIBLE SIGMOIDOSCOPY N/A 02/02/2015   Procedure: FLEXIBLE SIGMOIDOSCOPY;  Surgeon: Lamar JONETTA Aho, MD;  Location: [facility] ENDOSCOPY;  Service: Endoscopy;  Laterality: N/A;  ERBE   HEMOSTASIS CLIP PLACEMENT  08/29/2019   Procedure: HEMOSTASIS CLIP PLACEMENT;  Surgeon: Jake Victory LITTIE DOUGLAS, MD;  Location: [facility] ENDOSCOPY;  Service: Gastroenterology;;   HOT HEMOSTASIS N/A 08/29/2019   Procedure: HOT HEMOSTASIS (ARGON PLASMA COAGULATION/BICAP);  Surgeon: Jake Victory LITTIE DOUGLAS, MD;  Location: [facility] ENDOSCOPY;  Service: Gastroenterology;  Laterality: N/A;   LEFT HEART CATH AND CORONARY ANGIOGRAPHY N/A 11/20/2018   Procedure: LEFT HEART CATH AND CORONARY ANGIOGRAPHY;  Surgeon: Verlin Lonni JONETTA, MD;  Location: [facility] INVASIVE CV LAB;  Service: Cardiovascular;  Laterality: N/A;   LOOP RECORDER INSERTION N/A 08/14/2017   Procedure: LOOP RECORDER INSERTION;  Surgeon: Kelsie Agent, MD;  Location: [facility] INVASIVE CV LAB;  Service: Cardiovascular;  Laterality: N/A;   PARTIAL PROCTECTOMY BY TEM N/A  04/08/2015   Procedure: TEM PARTIAL PROCTECTOMY OF RECTAL MASS;  Surgeon: Elspeth Schultze, MD;  Location: [facility] ORS;  Service: General;  Laterality: N/A;   POLYPECTOMY  08/29/2019   Procedure: POLYPECTOMY;  Surgeon: Jake Victory LITTIE DOUGLAS, MD;  Location: [facility] ENDOSCOPY;  Service: Gastroenterology;;   TEE WITHOUT CARDIOVERSION N/A 06/25/2017   Procedure: TRANSESOPHAGEAL ECHOCARDIOGRAM (TEE);  Surgeon: Raford Riggs, MD;  Location: [facility] ENDOSCOPY;  Service: Cardiovascular;  Laterality: N/A;   TEE WITHOUT CARDIOVERSION N/A 11/26/2018   Procedure: TRANSESOPHAGEAL ECHOCARDIOGRAM (TEE);  Surgeon: Lucas Dorise POUR, MD;  Location: [facility] OR;  Service: Open Heart Surgery;  Laterality: N/A;   TONSILLECTOMY AND ADENOIDECTOMY     as child   Social History   Occupational History   Occupation: retired    Associate Professor: [employer]  Tobacco Use   Smoking status: Former    Current packs/day: 0.00    Types: Cigarettes    Quit date: 08/13/1994    Years since quitting: 29.2   Smokeless tobacco: Never  Vaping Use   Vaping status: Never Used  Substance and Sexual Activity   Alcohol  use: No    Alcohol /week: 0.0 standard drinks of alcohol    Drug use: No   Sexual activity: Not Currently    Birth control/protection: None

## 2023-10-30 ENCOUNTER — Encounter: Payer: Self-pay | Admitting: Orthopaedic Surgery

## 2023-10-30 ENCOUNTER — Ambulatory Visit (INDEPENDENT_AMBULATORY_CARE_PROVIDER_SITE_OTHER): Payer: Medicare Other | Admitting: Orthopaedic Surgery

## 2023-10-30 DIAGNOSIS — M25551 Pain in right hip: Secondary | ICD-10-CM

## 2023-10-30 MED ORDER — PREDNISONE 10 MG (21) PO TBPK: ORAL_TABLET | ORAL | 3 refills | Status: DC

## 2023-10-31 ENCOUNTER — Encounter: Payer: Self-pay | Admitting: Podiatry

## 2023-10-31 ENCOUNTER — Ambulatory Visit (INDEPENDENT_AMBULATORY_CARE_PROVIDER_SITE_OTHER): Payer: Medicare Other | Admitting: Podiatry

## 2023-10-31 DIAGNOSIS — N1832 Chronic kidney disease, stage 3b: Secondary | ICD-10-CM | POA: Diagnosis not present

## 2023-10-31 DIAGNOSIS — E1122 Type 2 diabetes mellitus with diabetic chronic kidney disease: Secondary | ICD-10-CM

## 2023-10-31 DIAGNOSIS — M79674 Pain in right toe(s): Secondary | ICD-10-CM

## 2023-10-31 DIAGNOSIS — M79675 Pain in left toe(s): Secondary | ICD-10-CM

## 2023-10-31 DIAGNOSIS — B351 Tinea unguium: Secondary | ICD-10-CM

## 2023-10-31 DIAGNOSIS — Z794 Long term (current) use of insulin: Secondary | ICD-10-CM | POA: Diagnosis not present

## 2023-11-01 DIAGNOSIS — E1129 Type 2 diabetes mellitus with other diabetic kidney complication: Secondary | ICD-10-CM | POA: Diagnosis not present

## 2023-11-01 DIAGNOSIS — E875 Hyperkalemia: Secondary | ICD-10-CM | POA: Diagnosis not present

## 2023-11-01 DIAGNOSIS — I129 Hypertensive chronic kidney disease with stage 1 through stage 4 chronic kidney disease, or unspecified chronic kidney disease: Secondary | ICD-10-CM | POA: Diagnosis not present

## 2023-11-01 DIAGNOSIS — N184 Chronic kidney disease, stage 4 (severe): Secondary | ICD-10-CM | POA: Diagnosis not present

## 2023-11-01 DIAGNOSIS — E1122 Type 2 diabetes mellitus with diabetic chronic kidney disease: Secondary | ICD-10-CM | POA: Diagnosis not present

## 2023-11-01 DIAGNOSIS — I4891 Unspecified atrial fibrillation: Secondary | ICD-10-CM | POA: Diagnosis not present

## 2023-11-02 ENCOUNTER — Telehealth: Payer: Self-pay | Admitting: Nurse Practitioner

## 2023-11-02 DIAGNOSIS — I5032 Chronic diastolic (congestive) heart failure: Secondary | ICD-10-CM | POA: Diagnosis not present

## 2023-11-02 DIAGNOSIS — E1122 Type 2 diabetes mellitus with diabetic chronic kidney disease: Secondary | ICD-10-CM | POA: Diagnosis not present

## 2023-11-02 DIAGNOSIS — I2510 Atherosclerotic heart disease of native coronary artery without angina pectoris: Secondary | ICD-10-CM | POA: Diagnosis not present

## 2023-11-02 DIAGNOSIS — Z8673 Personal history of transient ischemic attack (TIA), and cerebral infarction without residual deficits: Secondary | ICD-10-CM | POA: Diagnosis not present

## 2023-11-02 DIAGNOSIS — I480 Paroxysmal atrial fibrillation: Secondary | ICD-10-CM | POA: Diagnosis not present

## 2023-11-02 DIAGNOSIS — D509 Iron deficiency anemia, unspecified: Secondary | ICD-10-CM | POA: Diagnosis not present

## 2023-11-02 DIAGNOSIS — E785 Hyperlipidemia, unspecified: Secondary | ICD-10-CM | POA: Diagnosis not present

## 2023-11-02 DIAGNOSIS — Z794 Long term (current) use of insulin: Secondary | ICD-10-CM | POA: Diagnosis not present

## 2023-11-02 DIAGNOSIS — I130 Hypertensive heart and chronic kidney disease with heart failure and stage 1 through stage 4 chronic kidney disease, or unspecified chronic kidney disease: Secondary | ICD-10-CM | POA: Diagnosis not present

## 2023-11-02 DIAGNOSIS — R296 Repeated falls: Secondary | ICD-10-CM | POA: Diagnosis not present

## 2023-11-02 DIAGNOSIS — K7460 Unspecified cirrhosis of liver: Secondary | ICD-10-CM | POA: Diagnosis not present

## 2023-11-02 DIAGNOSIS — N184 Chronic kidney disease, stage 4 (severe): Secondary | ICD-10-CM | POA: Diagnosis not present

## 2023-11-02 NOTE — Telephone Encounter (Signed)
Contacted Amy from [name] to relay advice. Amy verbalized understanding and will reach out if numbers increase to 300 or above.

## 2023-11-02 NOTE — Telephone Encounter (Signed)
Faxed was received and placed in [facility] [address].

## 2023-11-02 NOTE — Telephone Encounter (Signed)
Paperwork has been faxed to the number provided.

## 2023-11-02 NOTE — Telephone Encounter (Signed)
Thank you for the update. If he is getting 250 and below we will hold the course for now. If he starts getting higher ones like 300 and above please reach out for further instructions

## 2023-11-02 NOTE — Telephone Encounter (Signed)
Copied from CRM [id number]. Topic: General - Other >> Nov 02, 2023 10:00 AM Fredrich Romans wrote: Reason for CRM: [facility] called to verify that fax was received on behalf of patient regarding a freestyle libre.I advised them that i do not see any documentation regarding this fax being sent over

## 2023-11-02 NOTE — Telephone Encounter (Signed)
Copied from CRM [id number]. Topic: Clinical - Medical Advice >> Nov 02, 2023 11:42 AM Theodis Sato wrote: Reason for CRM: Amy with [facility] states the patient has been put on a 6 day prednisone taper by Dr. Roda Shutters because the medication is causing blood sugars well over 250 and patient will be on prednisone until Tuesday morning but would like to be sure Jake Gross is aware of the situation incase he wants to make any adjustments to insulin dosages. For question please call [phone]

## 2023-11-06 ENCOUNTER — Ambulatory Visit: Payer: Self-pay

## 2023-11-06 DIAGNOSIS — H47321 Drusen of optic disc, right eye: Secondary | ICD-10-CM | POA: Diagnosis not present

## 2023-11-06 DIAGNOSIS — H2513 Age-related nuclear cataract, bilateral: Secondary | ICD-10-CM | POA: Diagnosis not present

## 2023-11-06 DIAGNOSIS — H43821 Vitreomacular adhesion, right eye: Secondary | ICD-10-CM | POA: Diagnosis not present

## 2023-11-06 DIAGNOSIS — E119 Type 2 diabetes mellitus without complications: Secondary | ICD-10-CM | POA: Diagnosis not present

## 2023-11-06 NOTE — Patient Outreach (Signed)
  Care Coordination   11/06/2023 Name: Jake Gross MRN: [id number] DOB: 1941/07/28   Care Coordination Outreach Attempts:  An unsuccessful outreach was attempted for an appointment today. HIPAA compliant voice message left with return call phone number.  Follow Up Plan:  Additional outreach attempts will be made to offer the patient complex care management information and services.   Encounter Outcome:  No Answer   Care Coordination Interventions:  No, not indicated    [name] RN, BSN, CCM [employer], Population Health Case Manager Phone: [phone]

## 2023-11-07 DIAGNOSIS — I480 Paroxysmal atrial fibrillation: Secondary | ICD-10-CM | POA: Diagnosis not present

## 2023-11-07 DIAGNOSIS — I130 Hypertensive heart and chronic kidney disease with heart failure and stage 1 through stage 4 chronic kidney disease, or unspecified chronic kidney disease: Secondary | ICD-10-CM | POA: Diagnosis not present

## 2023-11-07 DIAGNOSIS — E1122 Type 2 diabetes mellitus with diabetic chronic kidney disease: Secondary | ICD-10-CM | POA: Diagnosis not present

## 2023-11-07 DIAGNOSIS — N184 Chronic kidney disease, stage 4 (severe): Secondary | ICD-10-CM | POA: Diagnosis not present

## 2023-11-07 DIAGNOSIS — I5032 Chronic diastolic (congestive) heart failure: Secondary | ICD-10-CM | POA: Diagnosis not present

## 2023-11-07 DIAGNOSIS — K7460 Unspecified cirrhosis of liver: Secondary | ICD-10-CM | POA: Diagnosis not present

## 2023-11-08 DIAGNOSIS — E1122 Type 2 diabetes mellitus with diabetic chronic kidney disease: Secondary | ICD-10-CM | POA: Diagnosis not present

## 2023-11-08 DIAGNOSIS — I5032 Chronic diastolic (congestive) heart failure: Secondary | ICD-10-CM | POA: Diagnosis not present

## 2023-11-08 DIAGNOSIS — N184 Chronic kidney disease, stage 4 (severe): Secondary | ICD-10-CM | POA: Diagnosis not present

## 2023-11-08 DIAGNOSIS — I130 Hypertensive heart and chronic kidney disease with heart failure and stage 1 through stage 4 chronic kidney disease, or unspecified chronic kidney disease: Secondary | ICD-10-CM | POA: Diagnosis not present

## 2023-11-08 DIAGNOSIS — I480 Paroxysmal atrial fibrillation: Secondary | ICD-10-CM | POA: Diagnosis not present

## 2023-11-08 DIAGNOSIS — K7460 Unspecified cirrhosis of liver: Secondary | ICD-10-CM | POA: Diagnosis not present

## 2023-11-09 NOTE — Progress Notes (Signed)
  Subjective:  Patient ID: Jake Gross, male    DOB: March 22, 1941,  MRN: [id number]  83 y.o. male presents with at risk foot care. Pt has h/o NIDDM with chronic kidney disease and painful elongated mycotic toenails 1-5 bilaterally which are tender when wearing enclosed shoe gear. Pain is relieved with periodic professional debridement. Chief Complaint  Patient presents with   RFC    He is here for a nail trim, PCP dr. Toney Reil,     PCP: Eden Emms, NP. Theron Arista 10/12/2023.  New problem(s): None.   Review of Systems: Negative except as noted in the HPI.   No Known Allergies  Objective:  There were no vitals filed for this visit. Constitutional Patient is a pleasant 83 y.o. male in NAD. AAO x 3.  Vascular Capillary fill time to digits <3 seconds.  DP/PT pulse(s) are faintly palpable b/l lower extremities. Pedal hair absent b/l. Lower extremity skin temperature gradient warm to cool b/l. No pain with calf compression b/l. No cyanosis or clubbing noted. No ischemia nor gangrene noted b/l.   Neurologic Protective sensation intact 5/5 intact bilaterally with 10g monofilament b/l. Vibratory sensation decreased b/l.  Dermatologic Pedal skin is thin, shiny and atrophic b/l.  No open wounds b/l lower extremities. No interdigital macerations b/l lower extremities. Toenails 1-5 b/l elongated, discolored, dystrophic, thickened, crumbly with subungual debris and tenderness to dorsal palpation. No hyperkeratotic nor porokeratotic lesions present on today's visit.  Orthopedic: Normal muscle strength 5/5 to all lower extremity muscle groups bilaterally. No pain, crepitus or joint limitation noted with ROM bilateral LE. No gross bony deformities bilaterally.   Last HgA1c:     Latest Ref Rng & Units 10/12/2023    3:23 PM 06/11/2023    3:16 PM 03/05/2023    2:24 PM  Hemoglobin A1C  Hemoglobin-A1c 4.0 - 5.6 % 5.5  4.9  6.0      Assessment:   1. Pain due to onychomycosis of toenails of both feet     Plan:  Patient was evaluated and treated and all questions answered. Consent given for treatment as described below: -Patient to continue soft, supportive shoe gear daily. -Toenails 1-5 b/l were debrided in length and girth with sterile nail nippers and dremel without iatrogenic bleeding.  -Patient/POA to call should there be question/concern in the interim.  Return in about 3 months (around 01/29/2024).  Freddie Breech, DPM       LOCATION: [address], [place] [address]                   Office [phone]   [facility] LOCATION: [address] [place], [place] [address] Office [phone]

## 2023-11-12 DIAGNOSIS — N184 Chronic kidney disease, stage 4 (severe): Secondary | ICD-10-CM | POA: Diagnosis not present

## 2023-11-12 DIAGNOSIS — I130 Hypertensive heart and chronic kidney disease with heart failure and stage 1 through stage 4 chronic kidney disease, or unspecified chronic kidney disease: Secondary | ICD-10-CM | POA: Diagnosis not present

## 2023-11-12 DIAGNOSIS — K7460 Unspecified cirrhosis of liver: Secondary | ICD-10-CM | POA: Diagnosis not present

## 2023-11-12 DIAGNOSIS — E1122 Type 2 diabetes mellitus with diabetic chronic kidney disease: Secondary | ICD-10-CM | POA: Diagnosis not present

## 2023-11-12 DIAGNOSIS — I480 Paroxysmal atrial fibrillation: Secondary | ICD-10-CM | POA: Diagnosis not present

## 2023-11-12 DIAGNOSIS — I5032 Chronic diastolic (congestive) heart failure: Secondary | ICD-10-CM | POA: Diagnosis not present

## 2023-11-14 ENCOUNTER — Telehealth: Payer: Self-pay | Admitting: *Deleted

## 2023-11-14 DIAGNOSIS — N184 Chronic kidney disease, stage 4 (severe): Secondary | ICD-10-CM | POA: Diagnosis not present

## 2023-11-14 NOTE — Progress Notes (Unsigned)
Complex Care Management Care Guide Note  11/14/2023 Name: Quintyn Dombek Atha MRN: [id number] DOB: September 25, 1941  Deloyd S Paar is a 83 y.o. year old male who is a primary care patient of Eden Emms, NP and is actively engaged with the care management team. I reached out to [name] by phone today to assist with re-scheduling  with the RN Case Manager.  Follow up plan: Unsuccessful telephone outreach attempt made. A HIPAA compliant phone message was left for the patient providing contact information and requesting a return call.  Burman Nieves, CMA, Care Guide [facility]  [facility], [facility] Guide Direct Dial: [phone]  Fax: [phone] Website: [email or web address]

## 2023-11-15 DIAGNOSIS — N184 Chronic kidney disease, stage 4 (severe): Secondary | ICD-10-CM | POA: Diagnosis not present

## 2023-11-15 DIAGNOSIS — I480 Paroxysmal atrial fibrillation: Secondary | ICD-10-CM | POA: Diagnosis not present

## 2023-11-15 DIAGNOSIS — K7460 Unspecified cirrhosis of liver: Secondary | ICD-10-CM | POA: Diagnosis not present

## 2023-11-15 DIAGNOSIS — I130 Hypertensive heart and chronic kidney disease with heart failure and stage 1 through stage 4 chronic kidney disease, or unspecified chronic kidney disease: Secondary | ICD-10-CM | POA: Diagnosis not present

## 2023-11-15 DIAGNOSIS — E1122 Type 2 diabetes mellitus with diabetic chronic kidney disease: Secondary | ICD-10-CM | POA: Diagnosis not present

## 2023-11-15 DIAGNOSIS — I5032 Chronic diastolic (congestive) heart failure: Secondary | ICD-10-CM | POA: Diagnosis not present

## 2023-11-15 NOTE — Progress Notes (Signed)
Complex Care Management Note  Care Guide Note 11/15/2023 Name: Jake Gross MRN: [id number] DOB: 03/22/1941  Jake Gross is a 83 y.o. year old male who sees Cable, Genene Churn, NP for primary care. I reached out to [name] by phone today to offer complex care management services.  Mr. Ceesay was given information about Complex Care Management services today including:   The Complex Care Management services include support from the care team which includes your Nurse Coordinator, Clinical Social Worker, or Pharmacist.  The Complex Care Management team is here to help remove barriers to the health concerns and goals most important to you. Complex Care Management services are voluntary, and the patient may decline or stop services at any time by request to their care team member.   Complex Care Management Consent Status: Patient agreed to services and verbal consent obtained.   Follow up plan:  Telephone appointment with complex care management team member scheduled for:  11/21/2023  Encounter Outcome:  Patient Scheduled  Burman Nieves, CMA, Care Guide [facility]  [facility], [facility] Guide Direct Dial: [phone]  Fax: [phone] Website: [email or web address]

## 2023-11-19 DIAGNOSIS — K7460 Unspecified cirrhosis of liver: Secondary | ICD-10-CM | POA: Diagnosis not present

## 2023-11-19 DIAGNOSIS — I5032 Chronic diastolic (congestive) heart failure: Secondary | ICD-10-CM | POA: Diagnosis not present

## 2023-11-19 DIAGNOSIS — I480 Paroxysmal atrial fibrillation: Secondary | ICD-10-CM | POA: Diagnosis not present

## 2023-11-19 DIAGNOSIS — I130 Hypertensive heart and chronic kidney disease with heart failure and stage 1 through stage 4 chronic kidney disease, or unspecified chronic kidney disease: Secondary | ICD-10-CM | POA: Diagnosis not present

## 2023-11-19 DIAGNOSIS — E1122 Type 2 diabetes mellitus with diabetic chronic kidney disease: Secondary | ICD-10-CM | POA: Diagnosis not present

## 2023-11-19 DIAGNOSIS — N184 Chronic kidney disease, stage 4 (severe): Secondary | ICD-10-CM | POA: Diagnosis not present

## 2023-11-21 ENCOUNTER — Ambulatory Visit: Payer: Self-pay

## 2023-11-21 DIAGNOSIS — N184 Chronic kidney disease, stage 4 (severe): Secondary | ICD-10-CM | POA: Diagnosis not present

## 2023-11-21 DIAGNOSIS — E1122 Type 2 diabetes mellitus with diabetic chronic kidney disease: Secondary | ICD-10-CM | POA: Diagnosis not present

## 2023-11-21 DIAGNOSIS — K7460 Unspecified cirrhosis of liver: Secondary | ICD-10-CM | POA: Diagnosis not present

## 2023-11-21 DIAGNOSIS — I5032 Chronic diastolic (congestive) heart failure: Secondary | ICD-10-CM | POA: Diagnosis not present

## 2023-11-21 DIAGNOSIS — I130 Hypertensive heart and chronic kidney disease with heart failure and stage 1 through stage 4 chronic kidney disease, or unspecified chronic kidney disease: Secondary | ICD-10-CM | POA: Diagnosis not present

## 2023-11-21 DIAGNOSIS — I480 Paroxysmal atrial fibrillation: Secondary | ICD-10-CM | POA: Diagnosis not present

## 2023-11-21 NOTE — Patient Instructions (Signed)
 Visit Information  Thank you for taking time to visit with me today. Please don't hesitate to contact me if I can be of assistance to you.   Following are the goals we discussed today:   Goals Addressed             This Visit's Progress    Patient stated: decrease blood pressure and post hospital/SNF discharge follow up       Interventions Today    Flowsheet Row Most Recent Value  Chronic Disease   Chronic disease during today's visit Diabetes, Congestive Heart Failure (CHF), Hypertension (HTN), Other  [falls, chronic right hip pain]  General Interventions   General Interventions Discussed/Reviewed General Interventions Reviewed, Doctor Visits, [employer] of current treatment plan for listed health conditions and patients adherence to plan as established by provider.  Assessed for BP, BS, weight, HF symptoms and right hip pain. Referred to community resource for meals on wheels.]  Doctor Visits Discussed/Reviewed Doctor Visits Reviewed  [reivewed upcoming provider visits. Advised to keep follow up visits as recommended.]  Education Interventions   Education Provided Provided Education  [Reviewed HF action plan. Advised to continue monitoring weight, BP, BS and recording. Advised to notify provider of readings outside of established.   Discussed hypoglycemic management. Reviewed Rule of 15 treatment for hypoglycemic events.]  Provided Verbal Education On Other  [Advised to notify provider for increase hip pain.]  Nutrition Interventions   Nutrition Discussed/Reviewed Nutrition Reviewed, Carbohydrate meal planning, Decreasing sugar intake  [Discussed importance of ongoing diet management. Discussed eating lower carbohydrate meals. Eating less white potatos, rice, breads and sugary drinks.]  Pharmacy Interventions   Pharmacy Dicussed/Reviewed Pharmacy Topics Reviewed  [medications reviewed.  Advised compliance with medications. Confirmed patient able to manage his  medications.]  Safety Interventions   Safety Discussed/Reviewed Fall Risk  [Assessed for falls. Reviewed fall safety]              Our next appointment is by telephone on 12/24/23 at 10:45 am  Please call the care guide team at [phone] if you need to cancel or reschedule your appointment.   If you are experiencing a Mental Health or Behavioral Health Crisis or need someone to talk to, please call the Suicide and Crisis Lifeline: 988 call 1-800-273-TALK (toll free, 24 hour hotline)  Patient verbalizes understanding of instructions and care plan provided today and agrees to view in MyChart. Active MyChart status and patient understanding of how to access instructions and care plan via MyChart confirmed with patient.     Arvin Seip RN, BSN, CCM [employer], Population Health Case Manager Phone: [phone]

## 2023-11-21 NOTE — Patient Outreach (Signed)
 Care Coordination   Follow Up Visit Note   11/21/2023 Name: Jake Gross MRN: [id number] DOB: 04-11-1941  Jake Gross is a 83 y.o. year old male who sees Cable, Lynwood HERO, NP for primary care. I spoke with  [name] by phone today.  What matters to the patients health and wellness today?  Patient states he is doing well. He reports having right hip pain with a pain level of 4.  He states he was prescribed prednisone  which seems to have helped the pain.  Patient reports blood pressures are ranging from 140-190's/60-70's.  He states blood pressure continue to be high and states his provider is aware.  Patient reports blood sugars ranged from 100-200.  He states today's fasting blood sugar was 168.  He reports having only 1 low blood sugar of 60 within the past several weeks. He states he drank milk to treat.  Patient states he doesn't cook. He states he only eats microwave able meals.  Verbal agrees to referral to [employer] guide for meals on wheels.    Goals Addressed             This Visit's Progress    Patient stated: decrease blood pressure and post hospital/SNF discharge follow up       Interventions Today    Flowsheet Row Most Recent Value  Chronic Disease   Chronic disease during today's visit Diabetes, Congestive Heart Failure (CHF), Hypertension (HTN), Other  [falls, chronic right hip pain]  General Interventions   General Interventions Discussed/Reviewed General Interventions Reviewed, Doctor Visits, [employer] of current treatment plan for listed health conditions and patients adherence to plan as established by provider.  Assessed for BP, BS, weight, HF symptoms and right hip pain. Referred to community resource for meals on wheels.]  Doctor Visits Discussed/Reviewed Doctor Visits Reviewed  [reivewed upcoming provider visits. Advised to keep follow up visits as recommended.]  Education Interventions   Education Provided Provided  Education  [Reviewed HF action plan. Advised to continue monitoring weight, BP, BS and recording. Advised to notify provider of readings outside of established.   Discussed hypoglycemic management. Reviewed Rule of 15 treatment for hypoglycemic events.]  Provided Verbal Education On Other  [Advised to notify provider for increase hip pain.]  Nutrition Interventions   Nutrition Discussed/Reviewed Nutrition Reviewed, Carbohydrate meal planning, Decreasing sugar intake  [Discussed importance of ongoing diet management. Discussed eating lower carbohydrate meals. Eating less white potatos, rice, breads and sugary drinks.]  Pharmacy Interventions   Pharmacy Dicussed/Reviewed Pharmacy Topics Reviewed  [medications reviewed.  Advised compliance with medications. Confirmed patient able to manage his medications.]  Safety Interventions   Safety Discussed/Reviewed Fall Risk  [Assessed for falls. Reviewed fall safety]              SDOH assessments and interventions completed:  Yes  SDOH Interventions Today    Flowsheet Row Most Recent Value  SDOH Interventions   Food Insecurity Interventions Community Resources Provided  [patient is only able to cook microwaveable meals.  Agreeable to referral for meals on wheels.]  Housing Interventions Intervention Not Indicated  Transportation Interventions Intervention Not Indicated  Utilities Interventions Intervention Not Indicated        Care Coordination Interventions:  No, not indicated   Follow up plan: Follow up call scheduled for 01/03/24 at 10:45 pm    Encounter Outcome:  Patient Visit Completed   Cassadi Purdie RN, BSN, CCM [facility] Case  Manager Phone: [phone]

## 2023-11-23 DIAGNOSIS — E1122 Type 2 diabetes mellitus with diabetic chronic kidney disease: Secondary | ICD-10-CM | POA: Diagnosis not present

## 2023-11-23 DIAGNOSIS — Z794 Long term (current) use of insulin: Secondary | ICD-10-CM | POA: Diagnosis not present

## 2023-11-23 DIAGNOSIS — I130 Hypertensive heart and chronic kidney disease with heart failure and stage 1 through stage 4 chronic kidney disease, or unspecified chronic kidney disease: Secondary | ICD-10-CM | POA: Diagnosis not present

## 2023-11-23 DIAGNOSIS — D509 Iron deficiency anemia, unspecified: Secondary | ICD-10-CM | POA: Diagnosis not present

## 2023-11-23 DIAGNOSIS — I5032 Chronic diastolic (congestive) heart failure: Secondary | ICD-10-CM | POA: Diagnosis not present

## 2023-11-23 DIAGNOSIS — Z85048 Personal history of other malignant neoplasm of rectum, rectosigmoid junction, and anus: Secondary | ICD-10-CM | POA: Diagnosis not present

## 2023-11-23 DIAGNOSIS — I480 Paroxysmal atrial fibrillation: Secondary | ICD-10-CM | POA: Diagnosis not present

## 2023-11-23 DIAGNOSIS — E785 Hyperlipidemia, unspecified: Secondary | ICD-10-CM | POA: Diagnosis not present

## 2023-11-23 DIAGNOSIS — Z7984 Long term (current) use of oral hypoglycemic drugs: Secondary | ICD-10-CM | POA: Diagnosis not present

## 2023-11-23 DIAGNOSIS — Z7901 Long term (current) use of anticoagulants: Secondary | ICD-10-CM | POA: Diagnosis not present

## 2023-11-23 DIAGNOSIS — R296 Repeated falls: Secondary | ICD-10-CM | POA: Diagnosis not present

## 2023-11-23 DIAGNOSIS — Z87891 Personal history of nicotine dependence: Secondary | ICD-10-CM | POA: Diagnosis not present

## 2023-11-23 DIAGNOSIS — Z8673 Personal history of transient ischemic attack (TIA), and cerebral infarction without residual deficits: Secondary | ICD-10-CM | POA: Diagnosis not present

## 2023-11-23 DIAGNOSIS — Z9181 History of falling: Secondary | ICD-10-CM | POA: Diagnosis not present

## 2023-11-23 DIAGNOSIS — N184 Chronic kidney disease, stage 4 (severe): Secondary | ICD-10-CM | POA: Diagnosis not present

## 2023-11-23 DIAGNOSIS — I2510 Atherosclerotic heart disease of native coronary artery without angina pectoris: Secondary | ICD-10-CM | POA: Diagnosis not present

## 2023-11-23 DIAGNOSIS — K7460 Unspecified cirrhosis of liver: Secondary | ICD-10-CM | POA: Diagnosis not present

## 2023-11-26 DIAGNOSIS — I130 Hypertensive heart and chronic kidney disease with heart failure and stage 1 through stage 4 chronic kidney disease, or unspecified chronic kidney disease: Secondary | ICD-10-CM | POA: Diagnosis not present

## 2023-11-26 DIAGNOSIS — E1122 Type 2 diabetes mellitus with diabetic chronic kidney disease: Secondary | ICD-10-CM | POA: Diagnosis not present

## 2023-11-26 DIAGNOSIS — N184 Chronic kidney disease, stage 4 (severe): Secondary | ICD-10-CM | POA: Diagnosis not present

## 2023-11-26 DIAGNOSIS — K7460 Unspecified cirrhosis of liver: Secondary | ICD-10-CM | POA: Diagnosis not present

## 2023-11-26 DIAGNOSIS — I5032 Chronic diastolic (congestive) heart failure: Secondary | ICD-10-CM | POA: Diagnosis not present

## 2023-11-26 DIAGNOSIS — I480 Paroxysmal atrial fibrillation: Secondary | ICD-10-CM | POA: Diagnosis not present

## 2023-11-27 ENCOUNTER — Telehealth: Payer: Self-pay | Admitting: Nurse Practitioner

## 2023-11-27 NOTE — Telephone Encounter (Signed)
Copied from CRM [id number]. Topic: Clinical - Home Health Verbal Orders >> Nov 27, 2023  9:26 AM Deaijah H wrote: Caller/Agency: [facility] Callback Number: [phone] Service Requested: Skilled Nursing Frequency: 1 time a week for 3 weeks  (Week of march 3rd) Any new concerns about the patient? No

## 2023-11-27 NOTE — Telephone Encounter (Signed)
Please approve verbal orders

## 2023-11-27 NOTE — Telephone Encounter (Signed)
Contacted [facility] to speak with [name]. No answer. Left VM for her to call office back.

## 2023-11-28 DIAGNOSIS — K7460 Unspecified cirrhosis of liver: Secondary | ICD-10-CM | POA: Diagnosis not present

## 2023-11-28 DIAGNOSIS — N184 Chronic kidney disease, stage 4 (severe): Secondary | ICD-10-CM | POA: Diagnosis not present

## 2023-11-28 DIAGNOSIS — I5032 Chronic diastolic (congestive) heart failure: Secondary | ICD-10-CM | POA: Diagnosis not present

## 2023-11-28 DIAGNOSIS — I480 Paroxysmal atrial fibrillation: Secondary | ICD-10-CM | POA: Diagnosis not present

## 2023-11-28 DIAGNOSIS — I130 Hypertensive heart and chronic kidney disease with heart failure and stage 1 through stage 4 chronic kidney disease, or unspecified chronic kidney disease: Secondary | ICD-10-CM | POA: Diagnosis not present

## 2023-11-28 DIAGNOSIS — E1122 Type 2 diabetes mellitus with diabetic chronic kidney disease: Secondary | ICD-10-CM | POA: Diagnosis not present

## 2023-11-28 NOTE — Addendum Note (Signed)
Addended by: Melina Copa on: 11/28/2023 04:46 PM   Modules accepted: Orders

## 2023-11-28 NOTE — Telephone Encounter (Signed)
Contacted [name] from [facility] to relay approval of verbal orders.   [name] reports that pt BP readings today was 96/50. States that pt was not dizzy.  Reports that [facility] added Olmesartan 5mg  after trying 10mg  (BP bottomed out so decreased to 5mg )   Chlorthalidone increased to 25mg .  [name] reports pt has been checking BP regularly. Its within normal limits. She says she seeing numbers of systolic in 130s-140s instead of 170s-180s.  Will call with any updates/concerns.

## 2023-11-28 NOTE — Telephone Encounter (Signed)
Recommend they reach out to the nephrologist. If he becomes symptomatic of the blood pressure gets low discontinue the olmesartan and still follow up with nephrology

## 2023-11-29 DIAGNOSIS — H5703 Miosis: Secondary | ICD-10-CM | POA: Diagnosis not present

## 2023-11-29 DIAGNOSIS — H2511 Age-related nuclear cataract, right eye: Secondary | ICD-10-CM | POA: Diagnosis not present

## 2023-11-30 NOTE — Telephone Encounter (Signed)
Left voicemail for [name] to call the office back.

## 2023-12-03 DIAGNOSIS — I480 Paroxysmal atrial fibrillation: Secondary | ICD-10-CM | POA: Diagnosis not present

## 2023-12-03 DIAGNOSIS — N184 Chronic kidney disease, stage 4 (severe): Secondary | ICD-10-CM | POA: Diagnosis not present

## 2023-12-03 DIAGNOSIS — I5032 Chronic diastolic (congestive) heart failure: Secondary | ICD-10-CM | POA: Diagnosis not present

## 2023-12-03 DIAGNOSIS — K7460 Unspecified cirrhosis of liver: Secondary | ICD-10-CM | POA: Diagnosis not present

## 2023-12-03 DIAGNOSIS — I130 Hypertensive heart and chronic kidney disease with heart failure and stage 1 through stage 4 chronic kidney disease, or unspecified chronic kidney disease: Secondary | ICD-10-CM | POA: Diagnosis not present

## 2023-12-03 DIAGNOSIS — E1122 Type 2 diabetes mellitus with diabetic chronic kidney disease: Secondary | ICD-10-CM | POA: Diagnosis not present

## 2023-12-04 NOTE — Telephone Encounter (Addendum)
 Left voicemail for [name] to call the office back.

## 2023-12-05 DIAGNOSIS — I480 Paroxysmal atrial fibrillation: Secondary | ICD-10-CM | POA: Diagnosis not present

## 2023-12-05 DIAGNOSIS — K7460 Unspecified cirrhosis of liver: Secondary | ICD-10-CM | POA: Diagnosis not present

## 2023-12-05 DIAGNOSIS — E1122 Type 2 diabetes mellitus with diabetic chronic kidney disease: Secondary | ICD-10-CM | POA: Diagnosis not present

## 2023-12-05 DIAGNOSIS — I130 Hypertensive heart and chronic kidney disease with heart failure and stage 1 through stage 4 chronic kidney disease, or unspecified chronic kidney disease: Secondary | ICD-10-CM | POA: Diagnosis not present

## 2023-12-05 DIAGNOSIS — N184 Chronic kidney disease, stage 4 (severe): Secondary | ICD-10-CM | POA: Diagnosis not present

## 2023-12-05 DIAGNOSIS — I5032 Chronic diastolic (congestive) heart failure: Secondary | ICD-10-CM | POA: Diagnosis not present

## 2023-12-10 DIAGNOSIS — I130 Hypertensive heart and chronic kidney disease with heart failure and stage 1 through stage 4 chronic kidney disease, or unspecified chronic kidney disease: Secondary | ICD-10-CM | POA: Diagnosis not present

## 2023-12-10 DIAGNOSIS — I480 Paroxysmal atrial fibrillation: Secondary | ICD-10-CM | POA: Diagnosis not present

## 2023-12-10 DIAGNOSIS — I5032 Chronic diastolic (congestive) heart failure: Secondary | ICD-10-CM | POA: Diagnosis not present

## 2023-12-10 DIAGNOSIS — E1122 Type 2 diabetes mellitus with diabetic chronic kidney disease: Secondary | ICD-10-CM | POA: Diagnosis not present

## 2023-12-10 DIAGNOSIS — N184 Chronic kidney disease, stage 4 (severe): Secondary | ICD-10-CM | POA: Diagnosis not present

## 2023-12-10 DIAGNOSIS — K7460 Unspecified cirrhosis of liver: Secondary | ICD-10-CM | POA: Diagnosis not present

## 2023-12-10 NOTE — Telephone Encounter (Signed)
 Left detailed message for [name] with [facility]. Advised of Matts message and to call back with any further questions.

## 2023-12-11 ENCOUNTER — Ambulatory Visit (INDEPENDENT_AMBULATORY_CARE_PROVIDER_SITE_OTHER): Payer: Medicare Other

## 2023-12-11 VITALS — Ht 67.0 in | Wt 178.0 lb

## 2023-12-11 DIAGNOSIS — E1122 Type 2 diabetes mellitus with diabetic chronic kidney disease: Secondary | ICD-10-CM | POA: Diagnosis not present

## 2023-12-11 DIAGNOSIS — N184 Chronic kidney disease, stage 4 (severe): Secondary | ICD-10-CM | POA: Diagnosis not present

## 2023-12-11 DIAGNOSIS — Z0000 Encounter for general adult medical examination without abnormal findings: Secondary | ICD-10-CM | POA: Diagnosis not present

## 2023-12-11 DIAGNOSIS — I5032 Chronic diastolic (congestive) heart failure: Secondary | ICD-10-CM | POA: Diagnosis not present

## 2023-12-11 DIAGNOSIS — K7460 Unspecified cirrhosis of liver: Secondary | ICD-10-CM | POA: Diagnosis not present

## 2023-12-11 DIAGNOSIS — I130 Hypertensive heart and chronic kidney disease with heart failure and stage 1 through stage 4 chronic kidney disease, or unspecified chronic kidney disease: Secondary | ICD-10-CM | POA: Diagnosis not present

## 2023-12-11 DIAGNOSIS — I480 Paroxysmal atrial fibrillation: Secondary | ICD-10-CM | POA: Diagnosis not present

## 2023-12-11 NOTE — Patient Instructions (Signed)
 [name] , Thank you for taking time to come for your Medicare Wellness Visit. I appreciate your ongoing commitment to your health goals. Please review the following plan we discussed and let me know if I can assist you in the future.   Referrals/Orders/Follow-Ups/Clinician Recommendations: none  This is a list of the screening recommended for you and due dates:  Health Maintenance  Topic Date Due   Zoster (Shingles) Vaccine (1 of 2) Never done   Eye exam for diabetics  01/14/2018   COVID-19 Vaccine (4 - 2024-25 season) 10/11/2024*   Yearly kidney health urinalysis for diabetes  03/14/2024   Hemoglobin A1C  04/11/2024   Complete foot exam   07/17/2024   Colon Cancer Screening  08/28/2024   Yearly kidney function blood test for diabetes  10/11/2024   Medicare Annual Wellness Visit  12/10/2024   DTaP/Tdap/Td vaccine (2 - Td or Tdap) 10/19/2025   Pneumonia Vaccine  Completed   HPV Vaccine  Aged Out  *Topic was postponed. The date shown is not the original due date.    Advanced directives: (Declined) Advance directive discussed with you today. Even though you declined this today, please call our office should you change your mind, and we can give you the proper paperwork for you to fill out.  Next Medicare Annual Wellness Visit scheduled for next year: Yes 12/11/24 @1 :40pm

## 2023-12-11 NOTE — Progress Notes (Signed)
 Subjective:   Jake Gross is a 83 y.o. who presents for a Medicare Wellness preventive visit.  Visit Complete: Virtual I connected with  Timm S Berkley on 12/11/23 by a audio enabled telemedicine application and verified that I am speaking with the correct person using two identifiers.  Patient Location: Home  Provider Location: Home Office  I discussed the limitations of evaluation and management by telemedicine. The patient expressed understanding and agreed to proceed.  Vital Signs: Because this visit was a virtual/telehealth visit, some criteria may be missing or patient reported. Any vitals not documented were not able to be obtained and vitals that have been documented are patient reported.  VideoDeclined- This patient declined Librarian, academic. Therefore the visit was completed with audio only.  AWV Questionnaire: No: Patient Medicare AWV questionnaire was not completed prior to this visit.  Cardiac Risk Factors include: advanced age (>82men, >66 women);diabetes mellitus;dyslipidemia;hypertension;male gender;sedentary lifestyle    Objective:    Today's Vitals   12/11/23 1341 12/11/23 1342  Weight: 178 lb (80.7 kg)   Height: 5\' 7"  (1.702 m)   PainSc:  4    Body mass index is 27.88 kg/m.     12/11/2023    2:35 PM 07/22/2023    1:59 PM 02/02/2023   11:06 AM 02/02/2023   11:00 AM 01/25/2023    3:00 AM 01/24/2023    6:56 PM 12/05/2022    2:33 PM  Advanced Directives  Does Patient Have a Medical Advance Directive? No No  No No No No  Would patient like information on creating a medical advance directive?   No - Patient declined  No - Patient declined  Yes (ED - Information included in AVS)    Current Medications (verified) Outpatient Encounter Medications as of 12/11/2023  Medication Sig   ACCU-CHEK GUIDE test strip 1 EACH BY OTHER ROUTE 3 (THREE) TIMES DAILY AS NEEDED FOR OTHER. USE AS INSTRUCTED   acetaminophen (TYLENOL) 325 MG  tablet Take 2 tablets (650 mg total) by mouth every 6 (six) hours as needed for mild pain (or Fever >/= 101).   amLODipine (NORVASC) 10 MG tablet Take 10 mg by mouth daily.   bacitracin ointment Apply 1 Application topically 2 (two) times daily.   chlorthalidone (HYGROTON) 25 MG tablet Take 12.5 mg by mouth daily.   Continuous Glucose Receiver (FREESTYLE LIBRE 2 READER) DEVI Use to check blood sugars.   Continuous Glucose Sensor (FREESTYLE LIBRE 2 SENSOR) MISC Apply every 14 days to check blood sugars.   dapagliflozin propanediol (FARXIGA) 10 MG TABS tablet Take 10 mg by mouth daily.   ELIQUIS 2.5 MG TABS tablet TAKE 1 TABLET BY MOUTH TWICE A DAY   hydrALAZINE (APRESOLINE) 100 MG tablet TAKE 1 TABLET BY MOUTH 3 TIMES DAILY.   insulin glargine (LANTUS SOLOSTAR) 100 UNIT/ML Solostar Pen Inject 30 Units into the skin daily.   Insulin Pen Needle (BD PEN NEEDLE NANO 2ND GEN) 32G X 4 MM MISC Use with insulin pen as directed   Lancets (ONETOUCH ULTRASOFT) lancets Use as instructed   olmesartan (BENICAR) 5 MG tablet Take 10 mg by mouth daily.   rosuvastatin (CRESTOR) 20 MG tablet TAKE 1 TABLET BY MOUTH AT BEDTIME. SCHEDULE PHYSICAL EXAM   torsemide (DEMADEX) 20 MG tablet TAKE 1 TABLET BY MOUTH TWICE A DAY ON MONDAYS AND THURSDAYS ONLY, TAKE 1 TABLET BY MOUTH DAILY ONLY ALL OTHER DAYS   isosorbide mononitrate (IMDUR) 60 MG 24 hr tablet Take 1.5 tablets (90  mg total) by mouth daily.   predniSONE (STERAPRED UNI-PAK 21 TAB) 10 MG (21) TBPK tablet Take as directed (Patient not taking: Reported on 12/11/2023)   No facility-administered encounter medications on file as of 12/11/2023.    Allergies (verified) Patient has no known allergies.   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   CAD (coronary artery disease) 09/29/2019   Cervical spondylosis    Chronic anticoagulation    Chronic diastolic CHF (congestive heart failure) (HCC) 12/17/2018   CKD (chronic kidney disease) 12/17/2018   Diabetes mellitus    History of GI bleed 09/29/2019   Hyperlipidemia    Hypertension    Iron deficiency anemia due to chronic blood loss    Paroxysmal atrial fibrillation (HCC) 12/17/2018   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 NO ISCHEMIA   COLONOSCOPY W/ POLYPECTOMY     5 polyps   COLONOSCOPY WITH PROPOFOL N/A 08/29/2019   Procedure: COLONOSCOPY WITH PROPOFOL;  Surgeon: Sherrilyn Rist, MD;  Location[facility] ENDOSCOPY;  Service: Gastroenterology;  Laterality: N/A;   CORONARY ARTERY BYPASS GRAFT N/A 11/26/2018   Procedure: CORONARY ARTERY BYPASS GRAFTING (CABG), ON PUMP, TIMES FOUR, USING LEFT INTERNAL MAMMARY ARTERY AND ENDOSCOPICALLY HARVESTED LEFT SAPHENOUS VEIN;  Surgeon: Alleen Borne, MD;  Location: [facility] OR;  Service: Open Heart Surgery;  Laterality: N/A;   ESOPHAGOGASTRODUODENOSCOPY (EGD) WITH PROPOFOL N/A 08/29/2019   Procedure: ESOPHAGOGASTRODUODENOSCOPY (EGD) WITH PROPOFOL;  Surgeon: Sherrilyn Rist, MD;  Location: [facility] ENDOSCOPY;  Service: Gastroenterology;  Laterality: N/A;   EUS N/A 03/11/2015   Procedure: LOWER ENDOSCOPIC ULTRASOUND (EUS);  Surgeon: Rachael Fee, MD;  Location: [facility] ENDOSCOPY;  Service: Endoscopy;  Laterality: N/A;   FLEXIBLE SIGMOIDOSCOPY N/A 02/02/2015   Procedure: FLEXIBLE SIGMOIDOSCOPY;  Surgeon: Louis Meckel, MD;  Location: [facility] ENDOSCOPY;  Service: Endoscopy;  Laterality: N/A;  ERBE   HEMOSTASIS CLIP PLACEMENT  08/29/2019   Procedure: HEMOSTASIS CLIP PLACEMENT;  Surgeon: Sherrilyn Rist, MD;  Location: [facility] ENDOSCOPY;  Service:  Gastroenterology;;   HOT HEMOSTASIS N/A 08/29/2019   Procedure: HOT HEMOSTASIS (ARGON PLASMA COAGULATION/BICAP);  Surgeon: Sherrilyn Rist, MD;  Location: [facility] ENDOSCOPY;  Service: Gastroenterology;  Laterality: N/A;   LEFT HEART CATH AND CORONARY ANGIOGRAPHY N/A 11/20/2018   Procedure: LEFT HEART CATH AND CORONARY ANGIOGRAPHY;  Surgeon: Kathleene Hazel, MD;  Location: [facility] INVASIVE CV LAB;  Service: Cardiovascular;  Laterality: N/A;   LOOP RECORDER INSERTION N/A 08/14/2017   Procedure: LOOP RECORDER INSERTION;  Surgeon: Hillis Range, MD;  Location: [facility] INVASIVE CV LAB;  Service: Cardiovascular;  Laterality: N/A;   PARTIAL PROCTECTOMY BY TEM N/A 04/08/2015   Procedure: TEM PARTIAL PROCTECTOMY OF RECTAL MASS;  Surgeon: Karie Soda, MD;  Location: [facility] ORS;  Service: General;  Laterality: N/A;   POLYPECTOMY  08/29/2019   Procedure: POLYPECTOMY;  Surgeon: Sherrilyn Rist, MD;  Location: [facility] ENDOSCOPY;  Service: Gastroenterology;;   TEE WITHOUT CARDIOVERSION N/A 06/25/2017   Procedure: TRANSESOPHAGEAL ECHOCARDIOGRAM (TEE);  Surgeon: Chilton Si,  MD;  Location: [facility] ENDOSCOPY;  Service: Cardiovascular;  Laterality: N/A;   TEE WITHOUT CARDIOVERSION N/A 11/26/2018   Procedure: TRANSESOPHAGEAL ECHOCARDIOGRAM (TEE);  Surgeon: Alleen Borne, MD;  Location: [facility] OR;  Service: Open Heart Surgery;  Laterality: N/A;   TONSILLECTOMY AND ADENOIDECTOMY     as child   Family History  Problem Relation Age of Onset   Cancer Father        "all over"   Coronary artery disease Brother    Diabetes Brother    Stroke Mother    Diabetes Mother    Cancer Brother    Colon cancer Neg Hx    Esophageal cancer Neg Hx    Rectal cancer Neg Hx    Stomach cancer Neg Hx    Social History   Socioeconomic History   Marital status: Divorced    Spouse name: Not on file   Number of children: 2   Years of education: Not on file   Highest education level: Not on file  Occupational History   Occupation: retired     Associate Professor: [employer]  Tobacco Use   Smoking status: Former    Current packs/day: 0.00    Types: Cigarettes    Quit date: 08/13/1994    Years since quitting: 29.3   Smokeless tobacco: Never  Vaping Use   Vaping status: Never Used  Substance and Sexual Activity   Alcohol use: No    Alcohol/week: 0.0 standard drinks of alcohol   Drug use: No   Sexual activity: Not Currently    Birth control/protection: None  Other Topics Concern   Not on file  Social History Narrative   Not on file   Social Drivers of Health   Financial Resource Strain: Low Risk  (12/11/2023)   Overall Financial Resource Strain (CARDIA)    Difficulty of Paying Living Expenses: Not hard at all  Food Insecurity: No Food Insecurity (12/11/2023)   Hunger Vital Sign    Worried About Running Out of Food in the Last Year: Never true    Ran Out of Food in the Last Year: Never true  Transportation Needs: No Transportation Needs (12/11/2023)   PRAPARE - Administrator, Civil Service (Medical): No    Lack of Transportation (Non-Medical): No  Physical Activity: Insufficiently Active (12/11/2023)   Exercise Vital Sign    Days of Exercise per Week: 4 days    Minutes of Exercise per Session: 20 min  Stress: No Stress Concern Present (12/11/2023)   [employer] of Occupational Health - Occupational Stress Questionnaire    Feeling of Stress : Not at all  Social Connections: Socially Isolated (12/11/2023)   Social Connection and Isolation Panel [NHANES]    Frequency of Communication with Friends and Family: Once a week    Frequency of Social Gatherings with Friends and Family: Twice a week    Attends Religious Services: Never    Database administrator or Organizations: No    Attends Engineer, structural: Never    Marital Status: Divorced    Tobacco Counseling Counseling given: Not Answered  Clinical Intake:  Pre-visit preparation completed: Yes  Pain : 0-10 Pain Score: 4  Pain Type: Chronic  pain Pain Location: Hip Pain Orientation: Right Pain Descriptors / Indicators: Aching Pain Onset: More than a month ago Pain Frequency: Constant Pain Relieving Factors: Tylenol  Pain Relieving Factors: Tylenol  BMI - recorded: 27.88 Nutritional Status: BMI 25 -29 Overweight Nutritional Risks: None Diabetes: Yes CBG done?: Yes (  BS 132 this am at home per pt) CBG resulted in Enter/ Edit results?: No Did pt. bring in CBG monitor from home?: No  How often do you need to have someone help you when you read instructions, pamphlets, or other written materials from your doctor or pharmacy?: 1 - Never  Interpreter Needed?: No  Comments: lives alone Information entered by :: B.Nafeesa Dils,LPN   Activities of Daily Living     12/11/2023    2:36 PM 02/01/2023    9:49 PM  In your present state of health, do you have any difficulty performing the following activities:  Hearing? 0   Vision? 1   Difficulty concentrating or making decisions? 0   Walking or climbing stairs? 0   Dressing or bathing? 0   Doing errands, shopping? 0 1  Preparing Food and eating ? N   Using the Toilet? N   In the past six months, have you accidently leaked urine? N   Do you have problems with loss of bowel control? N   Managing your Medications? N   Managing your Finances? N   Housekeeping or managing your Housekeeping? N     Patient Care Team: Eden Emms, NP as PCP - General (Pain Medicine) Hillis Range, MD (Inactive) as PCP - Electrophysiology (Cardiology) Kathleene Hazel, MD as PCP - Cardiology (Cardiology) Louis Meckel, MD (Inactive) as Consulting Physician (Gastroenterology) Karie Soda, MD as Consulting Physician (General Surgery) Zenovia Jordan, MD as Consulting Physician (Rheumatology) Otho Ket, RN as [facility] Management [place], Huel Cov[employer], [facility] (Pharmacist) Sallye Lat, MD as Consulting Physician (Ophthalmology)  Indicate any recent Medical  Services you may have received from other than [facility] providers in the past year (date may be approximate).     Assessment:   This is a routine wellness examination for Lavon.  Hearing/Vision screen Hearing Screening - Comments:: Pt says hearing is good Vision Screening - Comments:: Pt says vision is good in rt eye after cataract surgery last week Lft eye blurry;readers only Dr Zetta Bills   Goals Addressed             This Visit's Progress    Patient stated: decrease blood pressure and post hospital/SNF discharge follow up   On track    Interventions Today    Flowsheet Row Most Recent Value  Chronic Disease   Chronic disease during today's visit Diabetes, Congestive Heart Failure (CHF), Hypertension (HTN), Other  [falls, chronic right hip pain]  General Interventions   General Interventions Discussed/Reviewed General Interventions Reviewed, Doctor Visits, [employer] of current treatment plan for listed health conditions and patients adherence to plan as established by provider.  Assessed for BP, BS, weight, HF symptoms and right hip pain. Referred to community resource for meals on wheels.]  Doctor Visits Discussed/Reviewed Doctor Visits Reviewed  [reivewed upcoming provider visits. Advised to keep follow up visits as recommended.]  Education Interventions   Education Provided Provided Education  [Reviewed HF action plan. Advised to continue monitoring weight, BP, BS and recording. Advised to notify provider of readings outside of established.   Discussed hypoglycemic management. Reviewed Rule of 15 treatment for hypoglycemic events.]  Provided Verbal Education On Other  [Advised to notify provider for increase hip pain.]  Nutrition Interventions   Nutrition Discussed/Reviewed Nutrition Reviewed, Carbohydrate meal planning, Decreasing sugar intake  [Discussed importance of ongoing diet management. Discussed eating lower carbohydrate meals. Eating less white potatos,  rice, breads and sugary drinks.]  Pharmacy Interventions  Pharmacy Dicussed/Reviewed Pharmacy Topics Reviewed  [medications reviewed.  Advised compliance with medications. Confirmed patient able to manage his medications.]  Safety Interventions   Safety Discussed/Reviewed Fall Risk  [Assessed for falls. Reviewed fall safety]             Depression Screen     12/11/2023    1:50 PM 10/12/2023    3:22 PM 07/13/2023    3:20 PM 03/05/2023    2:23 PM 12/05/2022    2:26 PM 10/28/2021    2:04 PM 09/22/2020    3:39 PM  PHQ 2/9 Scores  PHQ - 2 Score 0 0 0 0 0 0 0  PHQ- 9 Score  0 0 0       Fall Risk     12/11/2023    1:46 PM 10/12/2023    3:22 PM 07/13/2023    3:21 PM 03/05/2023    2:23 PM 12/05/2022    2:24 PM  Fall Risk   Falls in the past year? 0 0 1 1 1   Number falls in past yr: 0 0 1 1 0  Injury with Fall? 0 0 0 1 1  Risk for fall due to : No Fall Risks No Fall Risks History of fall(s);Other (Comment) History of fall(s) No Fall Risks;Orthopedic patient  Follow up Education provided;Falls prevention discussed Falls evaluation completed Falls evaluation completed Falls evaluation completed Education provided;Falls prevention discussed    MEDICARE RISK AT HOME:  Medicare Risk at Home Any stairs in or around the home?: Yes If so, are there any without handrails?: Yes Home free of loose throw rugs in walkways, pet beds, electrical cords, etc?: Yes Adequate lighting in your home to reduce risk of falls?: Yes Life alert?: No Use of a cane, walker or w/c?: Yes Grab bars in the bathroom?: No Shower chair or bench in shower?: No Elevated toilet seat or a handicapped toilet?: Yes  TIMED UP AND GO:  Was the test performed?  No  Cognitive Function: 6CIT completed        12/11/2023    1:54 PM 12/05/2022    2:35 PM  6CIT Screen  What Year? 0 points 0 points  What month? 0 points 0 points  What time? 0 points 0 points  Count back from 20 0 points 0 points  Months in reverse 0  points 0 points  Repeat phrase 0 points 0 points  Total Score 0 points 0 points    Immunizations Immunization History  Administered Date(s) Administered   Fluad Quad(high Dose 65+) 07/24/2019, 06/27/2021   Influenza, High Dose Seasonal PF 05/28/2023   Influenza,inj,Quad PF,6+ Mos 11/07/2016, 08/03/2017   PFIZER(Purple Top)SARS-COV-2 Vaccination 11/01/2019, 11/22/2019, 10/01/2020   PNEUMOCOCCAL CONJUGATE-20 10/28/2021   Tdap 10/20/2015    Screening Tests Health Maintenance  Topic Date Due   Zoster Vaccines- Shingrix (1 of 2) Never done   OPHTHALMOLOGY EXAM  01/14/2018   COVID-19 Vaccine (4 - 2024-25 season) 10/11/2024 (Originally 06/17/2023)   Diabetic kidney evaluation - Urine ACR  03/14/2024   HEMOGLOBIN A1C  04/11/2024   FOOT EXAM  07/17/2024   Colonoscopy  08/28/2024   Diabetic kidney evaluation - eGFR measurement  10/11/2024   Medicare Annual Wellness (AWV)  12/10/2024   DTaP/Tdap/Td (2 - Td or Tdap) 10/19/2025   Pneumonia Vaccine 64+ Years old  Completed   HPV VACCINES  Aged Out    Health Maintenance  Health Maintenance Due  Topic Date Due   Zoster Vaccines- Shingrix (1 of 2) Never done  OPHTHALMOLOGY EXAM  01/14/2018   Health Maintenance Items Addressed: n/a  Additional Screening:  Vision Screening: Recommended annual ophthalmology exams for early detection of glaucoma and other disorders of the eye.  Dental Screening: Recommended annual dental exams for proper oral hygiene  Community Resource Referral / Chronic Care Management: CRR required this visit?  No   CCM required this visit?  No    Plan:     I have personally reviewed and noted the following in the patient's chart:   Medical and social history Use of alcohol, tobacco or illicit drugs  Current medications and supplements including opioid prescriptions. Patient is not currently taking opioid prescriptions. Functional ability and status Nutritional status Physical activity Advanced  directives List of other physicians Hospitalizations, surgeries, and ER visits in previous 12 months Vitals Screenings to include cognitive, depression, and falls Referrals and appointments  In addition, I have reviewed and discussed with patient certain preventive protocols, quality metrics, and best practice recommendations. A written personalized care plan for preventive services as well as general preventive health recommendations were provided to patient.    Sue Lush, LPN   1/61/0960   After Visit Summary: (MyChart) Due to this being a telephonic visit, the after visit summary with patients personalized plan was offered to patient via MyChart   Notes: Nothing significant to report at this time.

## 2023-12-18 DIAGNOSIS — K7460 Unspecified cirrhosis of liver: Secondary | ICD-10-CM | POA: Diagnosis not present

## 2023-12-18 DIAGNOSIS — I480 Paroxysmal atrial fibrillation: Secondary | ICD-10-CM | POA: Diagnosis not present

## 2023-12-18 DIAGNOSIS — I5032 Chronic diastolic (congestive) heart failure: Secondary | ICD-10-CM | POA: Diagnosis not present

## 2023-12-18 DIAGNOSIS — N184 Chronic kidney disease, stage 4 (severe): Secondary | ICD-10-CM | POA: Diagnosis not present

## 2023-12-18 DIAGNOSIS — I130 Hypertensive heart and chronic kidney disease with heart failure and stage 1 through stage 4 chronic kidney disease, or unspecified chronic kidney disease: Secondary | ICD-10-CM | POA: Diagnosis not present

## 2023-12-18 DIAGNOSIS — E1122 Type 2 diabetes mellitus with diabetic chronic kidney disease: Secondary | ICD-10-CM | POA: Diagnosis not present

## 2023-12-19 DIAGNOSIS — I130 Hypertensive heart and chronic kidney disease with heart failure and stage 1 through stage 4 chronic kidney disease, or unspecified chronic kidney disease: Secondary | ICD-10-CM | POA: Diagnosis not present

## 2023-12-19 DIAGNOSIS — I5032 Chronic diastolic (congestive) heart failure: Secondary | ICD-10-CM | POA: Diagnosis not present

## 2023-12-19 DIAGNOSIS — E1122 Type 2 diabetes mellitus with diabetic chronic kidney disease: Secondary | ICD-10-CM | POA: Diagnosis not present

## 2023-12-19 DIAGNOSIS — I480 Paroxysmal atrial fibrillation: Secondary | ICD-10-CM | POA: Diagnosis not present

## 2023-12-19 DIAGNOSIS — K7460 Unspecified cirrhosis of liver: Secondary | ICD-10-CM | POA: Diagnosis not present

## 2023-12-19 DIAGNOSIS — N184 Chronic kidney disease, stage 4 (severe): Secondary | ICD-10-CM | POA: Diagnosis not present

## 2023-12-27 ENCOUNTER — Other Ambulatory Visit: Payer: Self-pay | Admitting: Cardiovascular Disease

## 2023-12-31 ENCOUNTER — Ambulatory Visit: Payer: Self-pay

## 2023-12-31 NOTE — Patient Outreach (Signed)
 Care Coordination   12/31/2023 Name: Jake Gross MRN: [id number] DOB: [birth year]   Care Coordination Outreach Attempts:  An unsuccessful outreach was attempted for an appointment today. Unable to reach patient. HIPAA compliant message left with return call phone number.   Follow Up Plan:  Additional outreach attempts will be made to offer the patient complex care management information and services.   Encounter Outcome:  No Answer   Care Coordination Interventions:  No, not indicated    [name] RN, BSN, CCM [employer], Population Health Case Manager Phone: [phone]

## 2024-01-01 ENCOUNTER — Ambulatory Visit (INDEPENDENT_AMBULATORY_CARE_PROVIDER_SITE_OTHER): Admitting: Orthopaedic Surgery

## 2024-01-01 ENCOUNTER — Encounter: Payer: Self-pay | Admitting: Orthopaedic Surgery

## 2024-01-01 DIAGNOSIS — M25551 Pain in right hip: Secondary | ICD-10-CM

## 2024-01-01 NOTE — Progress Notes (Signed)
 Office Visit Note   Patient: Jake Gross           Date of Birth: 1941-04-28           MRN: [id number] Visit Date: 01/01/2024              Requested by: Eden Emms, NP [address] [place],  [place] [address] PCP: Eden Emms, NP   Assessment & Plan: Visit Diagnoses:  1. Pain in right hip     Plan: Jake Gross is a 83 year old gentleman with chronic right trochanteric hip pain.  He feels that by taking 4 Tylenol's a day the pain is manageable at around the level of 2 out of 10.  The Dosepak sent his sugars up to 400s so I do not think a steroid injection is appropriate.  For now he would like to hold off on physical therapy or an MRI.  He will continue to take Tylenol as needed.  Will see him back as needed.  Follow-Up Instructions: No follow-ups on file.   Orders:  No orders of the defined types were placed in this encounter.  No orders of the defined types were placed in this encounter.     Procedures: No procedures performed   Clinical Data: No additional findings.   Subjective: Chief Complaint  Patient presents with   Right Hip - Pain    HPI Patient comes in today for follow-up evaluation of right hip pain.  He is currently treating it with Tylenol which she takes 4 tablets a day.  He states that the pain is manageable and around the 2-3 out of 10.  The Dosepak made his sugars go into the 400s. Review of Systems   Objective: Vital Signs: There were no vitals taken for this visit.  Physical Exam  Ortho Exam Exam of the right hip shows minimal tenderness to the trochanteric region. Specialty Comments:  No specialty comments available.  Imaging: No results found.   PMFS History: Patient Active Problem List   Diagnosis Date Noted   Right hip pain 10/12/2023   Enlarged and hypertrophic nails 07/13/2023   Hospital discharge follow-up 03/05/2023   Acute-on-chronic kidney injury (HCC) 02/01/2023   AKI (acute kidney injury) (HCC) 01/25/2023    Acute renal failure 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 (HCC) 09/28/2022   Elevated troponin 09/28/2022   Lactic acidosis 09/28/2022   Prolonged QT interval 09/28/2022   Medicare annual wellness visit, subsequent 10/29/2021   Abnormal SPEP 09/12/2021   Dyspnea 06/27/2021   Abscess 06/27/2021   History of anemia 06/27/2021   Nocturia 06/27/2021   Benign hypertensive kidney disease with chronic kidney disease 12/22/2019   Proteinuria 12/22/2019   Stage 3b chronic kidney disease (HCC) 12/22/2019   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   Hypokalemia 12/22/2019   History of GI bleed 09/29/2019   CAD (coronary artery disease) 09/29/2019   AVM (arteriovenous malformation) of small bowel, 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   Iron deficiency anemia due to chronic blood loss    Chronic anticoagulation    History of CVA (cerebrovascular accident) 08/25/2019   (HFpEF) heart failure with preserved ejection fraction (HCC) 12/17/2018   Paroxysmal atrial fibrillation (HCC) 12/17/2018   CKD (chronic kidney disease) 12/17/2018   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   Unstable angina (HCC)    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   HLD (hyperlipidemia) 09/14/2015   Essential hypertension    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    Arthritis 09/16/2012   Diabetes mellitus type 2, insulin dependent (HCC) 08/18/2011   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    Arthritis    AVM (arteriovenous malformation) of small bowel, 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   CAD (coronary artery disease) 09/29/2019   Cervical spondylosis    Chronic anticoagulation    Chronic diastolic CHF (congestive heart failure) (HCC) 12/17/2018   CKD (chronic kidney disease) 12/17/2018   Diabetes  mellitus    History of GI bleed 09/29/2019   Hyperlipidemia    Hypertension    Iron deficiency anemia due to chronic blood loss    Paroxysmal atrial fibrillation (HCC) 12/17/2018   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 NO ISCHEMIA   COLONOSCOPY W/ POLYPECTOMY     5 polyps   COLONOSCOPY WITH PROPOFOL N/A 08/29/2019   Procedure: COLONOSCOPY WITH PROPOFOL;  Surgeon: Sherrilyn Rist, MD;  Location: [facility] ENDOSCOPY;  Service: Gastroenterology;  Laterality: N/A;   CORONARY ARTERY BYPASS GRAFT N/A 11/26/2018   Procedure: CORONARY ARTERY BYPASS GRAFTING (CABG), ON PUMP, TIMES FOUR, USING LEFT INTERNAL MAMMARY ARTERY AND ENDOSCOPICALLY HARVESTED LEFT SAPHENOUS VEIN;  Surgeon: Alleen Borne, MD;  [facility]: [facility] OR;  Service: Open Heart Surgery;  Laterality: N/A;   ESOPHAGOGASTRODUODENOSCOPY (EGD) WITH PROPOFOL N/A 08/29/2019   Procedure: ESOPHAGOGASTRODUODENOSCOPY (EGD) WITH PROPOFOL;  Surgeon: Sherrilyn Rist, MD;  Location: [facility] ENDOSCOPY;  Service: Gastroenterology;  Laterality: N/A;   EUS N/A 03/11/2015   Procedure: LOWER ENDOSCOPIC ULTRASOUND (EUS);  Surgeon: Rachael Fee, MD;  Location: [facility] ENDOSCOPY;  Service: Endoscopy;  Laterality: N/A;   FLEXIBLE SIGMOIDOSCOPY N/A 02/02/2015   Procedure: FLEXIBLE SIGMOIDOSCOPY;  Surgeon: Louis Meckel, MD;  Location: [facility] ENDOSCOPY;  Service: Endoscopy;  Laterality: N/A;  ERBE   HEMOSTASIS CLIP PLACEMENT  08/29/2019   Procedure: HEMOSTASIS CLIP PLACEMENT;  Surgeon: Sherrilyn Rist, MD;  Location: [facility] ENDOSCOPY;  Service: Gastroenterology;;   HOT HEMOSTASIS N/A 08/29/2019   Procedure: HOT HEMOSTASIS (ARGON PLASMA COAGULATION/BICAP);  Surgeon: Sherrilyn Rist, MD;  Location: [facility] ENDOSCOPY;  Service: Gastroenterology;  Laterality: N/A;   LEFT HEART CATH AND CORONARY ANGIOGRAPHY N/A 11/20/2018   Procedure: LEFT HEART CATH AND CORONARY ANGIOGRAPHY;  Surgeon: Kathleene Hazel, MD;  [facility] INVASIVE CV LAB;  Service: Cardiovascular;  Laterality: N/A;   LOOP RECORDER INSERTION N/A 08/14/2017   Procedure: LOOP RECORDER INSERTION;  Surgeon: Hillis Range, MD;  [facility] INVASIVE CV LAB;  Service: Cardiovascular;  Laterality: N/A;   PARTIAL PROCTECTOMY BY TEM N/A 04/08/2015   Procedure: TEM PARTIAL PROCTECTOMY OF RECTAL MASS;  Surgeon: Karie Soda,  MD;  Location: [facility] ORS;  Service: General;  Laterality: N/A;   POLYPECTOMY  08/29/2019   Procedure: POLYPECTOMY;  Surgeon: Sherrilyn Rist, MD;  Location: [facility] ENDOSCOPY;  Service: Gastroenterology;;   TEE WITHOUT CARDIOVERSION N/A 06/25/2017   Procedure: TRANSESOPHAGEAL ECHOCARDIOGRAM (TEE);  Surgeon: Chilton Si, MD;  Location: [facility] ENDOSCOPY;  Service: Cardiovascular;  Laterality: N/A;   TEE WITHOUT CARDIOVERSION N/A 11/26/2018   Procedure: TRANSESOPHAGEAL ECHOCARDIOGRAM (TEE);  Surgeon: Alleen Borne, MD;  Location: [facility] OR;  Service: Open Heart Surgery;  Laterality: N/A;   TONSILLECTOMY AND ADENOIDECTOMY     as child   Social History   Occupational History   Occupation: retired    Associate Professor: [employer]  Tobacco Use   Smoking status: Former    Current packs/day: 0.00    Types: Cigarettes    Quit date: 08/13/1994    Years since quitting: 29.4   Smokeless tobacco: Never  Vaping Use   Vaping status: Never Used  Substance and Sexual Activity   Alcohol use: No    Alcohol/week: 0.0 standard drinks of alcohol   Drug use: No   Sexual activity: Not Currently    Birth control/protection: None

## 2024-01-07 ENCOUNTER — Other Ambulatory Visit: Payer: Self-pay | Admitting: Nurse Practitioner

## 2024-01-10 ENCOUNTER — Ambulatory Visit: Payer: Medicare Other | Attending: Cardiovascular Disease | Admitting: Cardiovascular Disease

## 2024-01-10 ENCOUNTER — Encounter: Payer: Self-pay | Admitting: Cardiovascular Disease

## 2024-01-10 VITALS — BP 130/56 | HR 62 | Ht 67.0 in | Wt 185.0 lb

## 2024-01-10 DIAGNOSIS — I5032 Chronic diastolic (congestive) heart failure: Secondary | ICD-10-CM | POA: Diagnosis not present

## 2024-01-10 DIAGNOSIS — I2510 Atherosclerotic heart disease of native coronary artery without angina pectoris: Secondary | ICD-10-CM | POA: Diagnosis not present

## 2024-01-10 DIAGNOSIS — I480 Paroxysmal atrial fibrillation: Secondary | ICD-10-CM | POA: Diagnosis not present

## 2024-01-10 DIAGNOSIS — N184 Chronic kidney disease, stage 4 (severe): Secondary | ICD-10-CM

## 2024-01-10 DIAGNOSIS — I10 Essential (primary) hypertension: Secondary | ICD-10-CM | POA: Diagnosis not present

## 2024-01-10 NOTE — Patient Instructions (Signed)
 Medication Instructions:  No changes *If you need a refill on your cardiac medications before your next appointment, please call your pharmacy*  Lab Work: none   Testing/Procedures: none  Follow-Up: At [facility], you and your health needs are our priority.  As part of our continuing mission to provide you with exceptional heart care, our providers are all part of one team.  This team includes your primary Cardiologist (physician) and Advanced Practice Providers or APPs (Physician Assistants and Nurse Practitioners) who all work together to provide you with the care you need, when you need it.  Your next appointment:   6 month(s)  Provider:   Verne Carrow, MD     We recommend signing up for the patient portal called "MyChart".  Sign up information is provided on this After Visit Summary.  MyChart is used to connect with patients for Virtual Visits (Telemedicine).  Patients are able to view lab/test results, encounter notes, upcoming appointments, etc.  Non-urgent messages can be sent to your provider as well.   To learn more about what you can do with MyChart, go to [email or web address].     1st Floor: - Lobby - Registration  - Pharmacy  - Lab - Cafe  2nd Floor: - PV Lab - Diagnostic Testing (echo, CT, nuclear med)  3rd Floor: - Vacant  4th Floor: - TCTS (cardiothoracic surgery) - AFib Clinic - Structural Heart Clinic - Vascular Surgery  - Vascular Ultrasound  5th Floor: - HeartCare Cardiology (general and EP) - Clinical Pharmacy for coumadin, hypertension, lipid, weight-loss medications, and med management appointments    Valet parking services will be available as well.

## 2024-01-10 NOTE — Progress Notes (Addendum)
 Chief Complaint  Patient presents with   Follow-up    CAD, HTN   History of Present Illness: 83 yo male with history of CAD s/p CABG in February 2020, HTN, HLD, CKD, prior CVA and persistent atrial fibrillation who is here today for cardiac follow up. He is on Eliqus. He was found to have severe left main and three vessel CAD in February 2020 and underwent 4V CABG in February 2020. Echo February 2020 with LVEF=60-65%. No significant valve disease. 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  GI said it was okay to restart Eliquis.  CHA2DS2-VASc equals 8 2D echo showed normal LV function with mild to moderate MR. He has been on Demadex. 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 Last creatinine 1.88 on 06/27/21. (Baseline 1.9-2.0). I saw him in the office in October 2022 and he reported worsened dyspnea on exertion and LE edema. His right leg was more swollen than his left leg. Venous dopplers showed no evidence of DVT. Echo 08/02/21 with LVEF=60-65% with grade 2 diastolic dysfunction. Normal RV function. No significant valve disease. He was admitted to [facility] with weakness in December 2023. No specific findings. He was admitted twice in April 2024 with acute on chronic kidney failure secondary to gastroenteritis. His beta blocker was stopped secondary to 2.5 second pauses. Hydrochlorothiazide was stopped and Torsemide was reduced. Echo April 2024 with LVEF=60-65%. Trivial MR. He was last seen in our office in September 2024 and was doing well overall, adjusting his Torsemide based on LE edema.   He is here today for follow up. The patient denies any chest pain, dyspnea, palpitations, orthopnea, PND, dizziness, near syncope or syncope. He has been taking Torsemide 20 mg daily with  40 mg on Mondays and Thursdays. No LE edema. BP has been in the 13--150 systolic range at home.   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   CAD (coronary artery disease) 09/29/2019   Cervical spondylosis    Chronic anticoagulation    Chronic diastolic CHF (congestive heart failure) (HCC) 12/17/2018   CKD (chronic kidney disease) 12/17/2018   Diabetes mellitus    History of GI bleed 09/29/2019   Hyperlipidemia    Hypertension    Iron deficiency anemia due to chronic blood loss    Paroxysmal atrial fibrillation (HCC) 12/17/2018   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 NO ISCHEMIA   COLONOSCOPY W/ POLYPECTOMY     5 polyps   COLONOSCOPY WITH PROPOFOL N/A 08/29/2019   Procedure: COLONOSCOPY WITH PROPOFOL;  Surgeon: Sherrilyn Rist, MD;  Location: [facility] ENDOSCOPY;  Service: Gastroenterology;  Laterality: N/A;   CORONARY ARTERY BYPASS GRAFT N/A 11/26/2018   Procedure: CORONARY ARTERY BYPASS GRAFTING (CABG), ON PUMP,  TIMES FOUR, USING LEFT INTERNAL MAMMARY ARTERY AND ENDOSCOPICALLY HARVESTED LEFT SAPHENOUS VEIN;  Surgeon: Alleen Borne, MD;  Location: [facility] OR;  Service: Open Heart Surgery;  Laterality: N/A;   ESOPHAGOGASTRODUODENOSCOPY (EGD) WITH PROPOFOL N/A 08/29/2019   Procedure: ESOPHAGOGASTRODUODENOSCOPY (EGD) WITH PROPOFOL;  Surgeon: Sherrilyn Rist, MD;  Location: [facility] ENDOSCOPY;  Service: Gastroenterology;  Laterality: N/A;   EUS N/A 03/11/2015   Procedure: LOWER ENDOSCOPIC ULTRASOUND (EUS);  Surgeon:  Rachael Fee, MD;  Location: [facility] ENDOSCOPY;  Service: Endoscopy;  Laterality: N/A;   FLEXIBLE SIGMOIDOSCOPY N/A 02/02/2015   Procedure: FLEXIBLE SIGMOIDOSCOPY;  Surgeon: Louis Meckel, MD;  Location: [facility] ENDOSCOPY;  Service: Endoscopy;  Laterality: N/A;  ERBE   HEMOSTASIS CLIP PLACEMENT  08/29/2019   Procedure: HEMOSTASIS CLIP PLACEMENT;  Surgeon: Sherrilyn Rist, MD;  Location: [facility] ENDOSCOPY;  Service: Gastroenterology;;   HOT HEMOSTASIS N/A 08/29/2019   Procedure: HOT HEMOSTASIS (ARGON PLASMA COAGULATION/BICAP);  Surgeon: Sherrilyn Rist, MD;  Location: [facility] ENDOSCOPY;  Service: Gastroenterology;  Laterality: N/A;   LEFT HEART CATH AND CORONARY ANGIOGRAPHY N/A 11/20/2018   Procedure: LEFT HEART CATH AND CORONARY ANGIOGRAPHY;  Surgeon: Kathleene Hazel, MD;  [facility] INVASIVE CV LAB;  Service: Cardiovascular;  Laterality: N/A;   LOOP RECORDER INSERTION N/A 08/14/2017   Procedure: LOOP RECORDER INSERTION;  Surgeon: Hillis Range, MD;  Location: [facility] INVASIVE CV LAB;  Service: Cardiovascular;  Laterality: N/A;   PARTIAL PROCTECTOMY BY TEM N/A 04/08/2015   Procedure: TEM PARTIAL PROCTECTOMY OF RECTAL MASS;  Surgeon: Karie Soda, MD;  Location: [facility] ORS;  Service: General;  Laterality: N/A;   POLYPECTOMY  08/29/2019   Procedure: POLYPECTOMY;  Surgeon: Sherrilyn Rist, MD;  Location: [facility] ENDOSCOPY;  Service: Gastroenterology;;   TEE WITHOUT CARDIOVERSION N/A 06/25/2017   Procedure: TRANSESOPHAGEAL ECHOCARDIOGRAM (TEE);  Surgeon: Chilton Si, MD;  Location: [facility] ENDOSCOPY;  Service: Cardiovascular;  Laterality: N/A;   TEE WITHOUT CARDIOVERSION N/A 11/26/2018   Procedure: TRANSESOPHAGEAL ECHOCARDIOGRAM (TEE);  Surgeon: Alleen Borne, MD;  Location: [facility] OR;  Service: Open Heart Surgery;  Laterality: N/A;   TONSILLECTOMY AND ADENOIDECTOMY     as child    Current Outpatient Medications  Medication Sig Dispense Refill   ACCU-CHEK GUIDE test strip 1 EACH BY OTHER ROUTE 3 (THREE) TIMES  DAILY AS NEEDED FOR OTHER. USE AS INSTRUCTED 100 strip 6   acetaminophen (TYLENOL) 325 MG tablet Take 2 tablets (650 mg total) by mouth every 6 (six) hours as needed for mild pain (or Fever >/= 101).     amLODipine (NORVASC) 10 MG tablet Take 10 mg by mouth daily.     bacitracin ointment Apply 1 Application topically 2 (two) times daily. 120 g 0   chlorthalidone (HYGROTON) 25 MG tablet Take 12.5 mg by mouth daily.     Continuous Glucose Receiver (FREESTYLE LIBRE 2 READER) DEVI Use to check blood sugars. 1 each 0   Continuous Glucose Sensor (FREESTYLE LIBRE 2 SENSOR) MISC Apply every 14 days to check blood sugars. 6 each 1   dapagliflozin propanediol (FARXIGA) 10 MG TABS tablet Take 10 mg by mouth daily.     ELIQUIS 2.5 MG TABS tablet TAKE 1 TABLET BY MOUTH TWICE A DAY 180 tablet 1   hydrALAZINE (APRESOLINE) 100 MG tablet TAKE 1 TABLET BY MOUTH 3 TIMES DAILY. 270 tablet 1   insulin glargine (LANTUS SOLOSTAR) 100 UNIT/ML Solostar Pen Inject 30 Units into the skin daily. 45 mL 0   Insulin Pen Needle (  BD PEN NEEDLE NANO 2ND GEN) 32G X 4 MM MISC Use with insulin pen as directed 100 each 3   Lancets (ONETOUCH ULTRASOFT) lancets Use as instructed 100 each 12   olmesartan (BENICAR) 5 MG tablet Take 5 mg by mouth daily.     predniSONE (STERAPRED UNI-PAK 21 TAB) 10 MG (21) TBPK tablet Take as directed 21 tablet 3   rosuvastatin (CRESTOR) 20 MG tablet TAKE 1 TABLET BY MOUTH AT BEDTIME. SCHEDULE PHYSICAL EXAM 90 tablet 1   torsemide (DEMADEX) 20 MG tablet TAKE 1 TABLET BY MOUTH TWICE A DAY ON MONDAYS AND THURSDAYS ONLY, TAKE 1 TABLET BY MOUTH DAILY ONLY ALL OTHER DAYS 120 tablet 3   isosorbide mononitrate (IMDUR) 60 MG 24 hr tablet Take 1.5 tablets (90 mg total) by mouth daily. 135 tablet 3   No current facility-administered medications for this visit.    No Known Allergies  Social History   Socioeconomic History   Marital status: Divorced    Spouse name: Not on file   Number of children: 2   Years  of education: Not on file   Highest education level: Not on file  Occupational History   Occupation: retired    Associate Professor: [employer]  Tobacco Use   Smoking status: Former    Current packs/day: 0.00    Types: Cigarettes    Quit date: 08/13/1994    Years since quitting: 29.4   Smokeless tobacco: Never  Vaping Use   Vaping status: Never Used  Substance and Sexual Activity   Alcohol use: No    Alcohol/week: 0.0 standard drinks of alcohol   Drug use: No   Sexual activity: Not Currently    Birth control/protection: None  Other Topics Concern   Not on file  Social History Narrative   Not on file   Social Drivers of Health   Financial Resource Strain: Low Risk  (12/11/2023)   Overall Financial Resource Strain (CARDIA)    Difficulty of Paying Living Expenses: Not hard at all  Food Insecurity: No Food Insecurity (12/11/2023)   Hunger Vital Sign    Worried About Running Out of Food in the Last Year: Never true    Ran Out of Food in the Last Year: Never true  Transportation Needs: No Transportation Needs (12/11/2023)   PRAPARE - Administrator, Civil Service (Medical): No    Lack of Transportation (Non-Medical): No  Physical Activity: Insufficiently Active (12/11/2023)   Exercise Vital Sign    Days of Exercise per Week: 4 days    Minutes of Exercise per Session: 20 min  Stress: No Stress Concern Present (12/11/2023)   [employer] of Occupational Health - Occupational Stress Questionnaire    Feeling of Stress : Not at all  Social Connections: Socially Isolated (12/11/2023)   Social Connection and Isolation Panel [NHANES]    Frequency of Communication with Friends and Family: Once a week    Frequency of Social Gatherings with Friends and Family: Twice a week    Attends Religious Services: Never    Database administrator or Organizations: No    Attends Banker Meetings: Never    Marital Status: Divorced  Catering manager Violence: Not At Risk (12/11/2023)    Humiliation, Afraid, Rape, and Kick questionnaire    Fear of Current or Ex-Partner: No    Emotionally Abused: No    Physically Abused: No    Sexually Abused: No    Family History  Problem Relation Age of Onset  Cancer Father        "all over"   Coronary artery disease Brother    Diabetes Brother    Stroke Mother    Diabetes Mother    Cancer Brother    Colon cancer Neg Hx    Esophageal cancer Neg Hx    Rectal cancer Neg Hx    Stomach cancer Neg Hx     Review of Systems:  As stated in the HPI and otherwise negative.   BP (!) 130/56   Pulse 62   Ht 5\' 7"  (1.702 m)   Wt 83.9 kg   SpO2 97%   BMI 28.98 kg/m   Physical Examination: General: Well developed, well nourished, NAD  HEENT: OP clear, mucus membranes moist  SKIN: warm, dry. No rashes. Neuro: No focal deficits  Musculoskeletal: Muscle strength 5/5 all ext  Psychiatric: Mood and affect normal  Neck: No JVD, no carotid bruits, no thyromegaly, no lymphadenopathy.  Lungs:Clear bilaterally, no wheezes, rhonci, crackles Cardiovascular: Irreg irreg. Soft systolic murmur. No murmurs, gallops or rubs. Abdomen:Soft. Bowel sounds present. Non-tender.  Extremities: No lower extremity edema. Pulses are 2 + in the bilateral DP/PT.  EKG:  EKG is not ordered today. The ekg ordered today demonstrates   Recent Labs: 01/24/2023: B Natriuretic Peptide 117.4 02/02/2023: TSH 0.731 02/04/2023: Magnesium 2.5 10/12/2023: ALT 11; BUN 51; Creat 2.30; Hemoglobin 12.0; Platelets 292; Potassium 3.7; Sodium 142   Lipid Panel    Component Value Date/Time   CHOL 187 04/27/2022 1437   TRIG (H) 04/27/2022 1437    486.0 Triglyceride is over 400; calculations on Lipids are invalid.   HDL 23.70 (L) 04/27/2022 1437   CHOLHDL 8 04/27/2022 1437   VLDL 42.8 (H) 11/12/2018 1528   LDLCALC 98 10/28/2021 1510   LDLDIRECT 98.0 04/27/2022 1437     Wt Readings from Last 3 Encounters:  01/10/24 83.9 kg  12/11/23 80.7 kg  10/12/23 80.8 kg     Assessment and Plan:   1. CAD s/p CABG without angina: No chest pain. Normal LV function by echo in April 2024. Continue Imdur and statin. He is not on an ASA since he is on Eliquis. He is not on a beta blocker secondary to pauses in April 2024 while admitted to [facility].    2. HTN: BP is well controlled. I am ok with some systolic pressures in the 90s. Continue Imdur, Norvasc, Benicar and Hydralazine. He is followed closely in Nephrology.   3. Persistent atrial fibrillation: He is not on any AV nodal blocking agents currently secondary to pauses as noted above. Rate is controlled. Continue Eliquis.   4. Chronic diastolic CHF: Wt is stable. No volume overload on exam. Continue torsemide 20 mg daily with 40 mg on Mondays and Thursdays.   5. 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   Labs/ tests ordered today include:  No orders of the defined types were placed in this encounter.  Disposition:   F/U with Tereso Newcomer, PA-C in 6 months  Signed, Verne Carrow, MD 01/10/2024 4:46 PM    [facility] [address] [place], [place], [place]  [address] Phone: [phone]; Fax: [phone]

## 2024-01-11 ENCOUNTER — Ambulatory Visit: Payer: Medicare Other | Admitting: Nurse Practitioner

## 2024-01-22 ENCOUNTER — Telehealth: Payer: Self-pay | Admitting: Nurse Practitioner

## 2024-01-22 DIAGNOSIS — H2512 Age-related nuclear cataract, left eye: Secondary | ICD-10-CM | POA: Diagnosis not present

## 2024-01-22 NOTE — Telephone Encounter (Signed)
 Copied from CRM [id number]. Topic: Referral - Prior Authorization Question >> Jan 22, 2024  2:01 PM Adrionna Y wrote: Reason for CRM: Joe from [facility] calling to see if the prior authorizations for this patient has been received. If so would like a call at [phone]

## 2024-01-23 NOTE — Telephone Encounter (Signed)
 I found a number for [place] in [place] to verify the numbers on the form. There is a fax number at the top of [phone]. The person I spoke to at [facility] said that is not their fax. Their fax numbers start with [phone]. We will shred this and not move forward with it.

## 2024-01-23 NOTE — Telephone Encounter (Signed)
 Left message for pt to call office. The fax we received from [facility] does not look very legitimate. We want to make sure he requested this through them before moving forward.

## 2024-01-24 NOTE — Telephone Encounter (Signed)
 Copied from CRM [id number]. Topic: Referral - Prior Authorization Question >> Jan 22, 2024  2:01 PM Adrionna Y wrote: Reason for CRM: Joe from [facility] calling to see if the prior authorizations for this patient has been received. If so would like a call at [phone] >> Jan 24, 2024 11:37 AM [place] J wrote: Representative calling back in regards to a re-fax that was sent over this morning that should have the fax number of [phone]. Representative stated that documents are urgent.

## 2024-01-25 ENCOUNTER — Ambulatory Visit: Payer: Medicare Other | Admitting: Nurse Practitioner

## 2024-01-25 VITALS — BP 120/60 | HR 62 | Temp 97.5°F | Ht 67.0 in | Wt 187.2 lb

## 2024-01-25 DIAGNOSIS — E1122 Type 2 diabetes mellitus with diabetic chronic kidney disease: Secondary | ICD-10-CM | POA: Diagnosis not present

## 2024-01-25 DIAGNOSIS — Z794 Long term (current) use of insulin: Secondary | ICD-10-CM

## 2024-01-25 DIAGNOSIS — E119 Type 2 diabetes mellitus without complications: Secondary | ICD-10-CM

## 2024-01-25 DIAGNOSIS — I10 Essential (primary) hypertension: Secondary | ICD-10-CM

## 2024-01-25 DIAGNOSIS — Z125 Encounter for screening for malignant neoplasm of prostate: Secondary | ICD-10-CM | POA: Diagnosis not present

## 2024-01-25 LAB — POCT GLYCOSYLATED HEMOGLOBIN (HGB A1C): Hemoglobin A1C: 5.5 % (ref 4.0–5.6)

## 2024-01-25 MED ORDER — LANTUS SOLOSTAR 100 UNIT/ML ~~LOC~~ SOPN: 25.0000 [IU] | PEN_INJECTOR | Freq: Every day | SUBCUTANEOUS | Status: DC

## 2024-01-25 NOTE — Patient Instructions (Signed)
 Nice to see you today  I will be in touch with the labs once I have reviewed them Follow up with me in 3 months, sooner if you need me   We decreased the insulin to 25units a day. If you still have sugar readings in the 50s-60 let me know and go down to 20 units a day

## 2024-01-25 NOTE — Assessment & Plan Note (Signed)
 Currently maintained on isosorbide mononitrate 60 mg, torsemide 20 mg, olmesartan 5 mg, hydralazine 100 mg 3 times daily, chlorthalidone 25 mg, amlodipine 10 mg patient is followed by cardiology.  Tolerating medications well.

## 2024-01-25 NOTE — Addendum Note (Signed)
 Addended by: Melina Copa on: 01/25/2024 04:27 PM   Modules accepted: Orders

## 2024-01-25 NOTE — Assessment & Plan Note (Signed)
 Patient was on 30 units of long acting insulin. He was having hypoglycemia. Reduce insuline to 25 units daily. If he continues to have low readings he will decrease further to 20 units

## 2024-01-25 NOTE — Progress Notes (Signed)
 Established Patient Office Visit  Subjective   Patient ID: Jake Gross, male    DOB: 27-Apr-1941  Age: 83 y.o. MRN: [id number]  Chief Complaint  Patient presents with   Diabetes    HPI   DM2:Patient currently maintained on 30 units of Lantus at bedtime.  He also on Farxiga.  Patient A1c last time was 5.5%.  He was checking his glucose daily. He is getting readings in the 50-70s he will drink som emilk or orange juice.  States that he did go see  Dr. Roda Shutters and was told to take tylenol. He did want give an injection but declined. He did steroids and it did not help    Colonoscopy: 2020. No repeat  and he does not want to pursue      Review of Systems  Constitutional:  Negative for chills and fever.  Respiratory:  Negative for shortness of breath.   Cardiovascular:  Negative for chest pain.  Neurological:  Negative for headaches.  Psychiatric/Behavioral:  Negative for hallucinations and suicidal ideas.       Objective:     BP 120/60   Pulse 62   Temp (!) 97.5 F (36.4 C) (Oral)   Ht 5\' 7"  (1.702 m)   Wt 187 lb 3.2 oz (84.9 kg)   SpO2 95%   BMI 29.32 kg/m  BP Readings from Last 3 Encounters:  01/25/24 120/60  01/10/24 (!) 130/56  10/12/23 134/70   Wt Readings from Last 3 Encounters:  01/25/24 187 lb 3.2 oz (84.9 kg)  01/10/24 185 lb (83.9 kg)  12/11/23 178 lb (80.7 kg)   SpO2 Readings from Last 3 Encounters:  01/25/24 95%  01/10/24 97%  10/12/23 98%      Physical Exam Vitals and nursing note reviewed.  Constitutional:      Appearance: Normal appearance.  Cardiovascular:     Rate and Rhythm: Normal rate and regular rhythm.     Heart sounds: Normal heart sounds.  Pulmonary:     Effort: Pulmonary effort is normal.     Breath sounds: Normal breath sounds.  Abdominal:     General: Bowel sounds are normal.  Neurological:     Mental Status: He is alert.      No results found for any visits on 01/25/24.    The ASCVD Risk score (Arnett DK, et  al., 2019) failed to calculate for the following reasons:   The 2019 ASCVD risk score is only valid for ages 93 to 96   Risk score cannot be calculated because patient has a medical history suggesting prior/existing ASCVD    Assessment & Plan:   Problem List Items Addressed This Visit       Cardiovascular and Mediastinum   Essential hypertension - Primary (Chronic)   Currently maintained on isosorbide mononitrate 60 mg, torsemide 20 mg, olmesartan 5 mg, hydralazine 100 mg 3 times daily, chlorthalidone 25 mg, amlodipine 10 mg patient is followed by cardiology.  Tolerating medications well.      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 He was having hypoglycemia. Reduce insuline to 25 units daily. If he continues to have low readings he will decrease further to 20 units       Relevant Medications   insulin glargine (LANTUS  SOLOSTAR) 100 UNIT/ML Solostar Pen   Other Relevant Orders   CBC   Comprehensive metabolic panel with GFR   Lipid panel   Other Visit Diagnoses       Screening for prostate cancer           Return in about 3 months (around 04/25/2024) for DM recheck.    Audria Nine, NP

## 2024-01-26 LAB — LIPID PANEL
Cholesterol: 101 mg/dL (ref <200)
HDL: 35 mg/dL — ABNORMAL LOW (ref >=40)
LDL Cholesterol (Calc): 43 mg/dL
Non-HDL Cholesterol (Calc): 66 mg/dL (ref <130)
Total CHOL/HDL Ratio: 2.9 (calc) (ref <5.0)
Triglycerides: 148 mg/dL (ref <150)

## 2024-01-26 LAB — COMPREHENSIVE METABOLIC PANEL WITH GFR
AG Ratio: 1.4 (calc) (ref 1.0–2.5)
ALT: 7 U/L — ABNORMAL LOW (ref 9–46)
AST: 16 U/L (ref 10–35)
Albumin: 3.7 g/dL (ref 3.6–5.1)
Alkaline phosphatase (APISO): 82 U/L (ref 35–144)
BUN/Creatinine Ratio: 21 (calc) (ref 6–22)
BUN: 52 mg/dL — ABNORMAL HIGH (ref 7–25)
CO2: 21 mmol/L (ref 20–32)
Calcium: 8.8 mg/dL (ref 8.6–10.3)
Chloride: 110 mmol/L (ref 98–110)
Creat: 2.43 mg/dL — ABNORMAL HIGH (ref 0.70–1.22)
Globulin: 2.6 g/dL (ref 1.9–3.7)
Glucose, Bld: 122 mg/dL — ABNORMAL HIGH (ref 65–99)
Potassium: 5.6 mmol/L — ABNORMAL HIGH (ref 3.5–5.3)
Sodium: 141 mmol/L (ref 135–146)
Total Bilirubin: 0.5 mg/dL (ref 0.2–1.2)
Total Protein: 6.3 g/dL (ref 6.1–8.1)
eGFR: 26 mL/min/{1.73_m2} — ABNORMAL LOW (ref >=60)

## 2024-01-26 LAB — TSH: TSH: 2.87 m[IU]/L (ref 0.40–4.50)

## 2024-01-26 LAB — CBC
HCT: 32.2 % — ABNORMAL LOW (ref 38.5–50.0)
Hemoglobin: 10.3 g/dL — ABNORMAL LOW (ref 13.2–17.1)
MCH: 27.6 pg (ref 27.0–33.0)
MCHC: 32 g/dL (ref 32.0–36.0)
MCV: 86.3 fL (ref 80.0–100.0)
MPV: 10.8 fL (ref 7.5–12.5)
Platelets: 203 10*3/uL (ref 140–400)
RBC: 3.73 10*6/uL — ABNORMAL LOW (ref 4.20–5.80)
RDW: 14.6 % (ref 11.0–15.0)
WBC: 9.1 10*3/uL (ref 3.8–10.8)

## 2024-01-28 ENCOUNTER — Encounter: Payer: Self-pay | Admitting: Nurse Practitioner

## 2024-01-31 DIAGNOSIS — H5703 Miosis: Secondary | ICD-10-CM | POA: Diagnosis not present

## 2024-01-31 DIAGNOSIS — H2512 Age-related nuclear cataract, left eye: Secondary | ICD-10-CM | POA: Diagnosis not present

## 2024-02-06 ENCOUNTER — Telehealth: Payer: Self-pay

## 2024-02-06 NOTE — Telephone Encounter (Signed)
 Following up on pt PAP BMS (Eliquis ) ,Gave pt a call to see if pt wants to start the process on pt's assistance application on Elequis,pt did not answer left a HIPAA VM

## 2024-02-12 NOTE — Telephone Encounter (Signed)
 Gave pt a call to follow up on PAP Eliquis  (BMS) spoke with pt, he wants a call back at a later time he was not able to talked,will follow up

## 2024-02-19 NOTE — Telephone Encounter (Signed)
 Gave pt a call to follow up on application for Eliquis  (BMS to check to see if pt wants to start the process to send an application to BMS pt did not answer,we have call several times pt has not respond to our calls at this time we will let pt reach back if pt decide to due so.

## 2024-02-20 ENCOUNTER — Encounter: Payer: Self-pay | Admitting: Podiatry

## 2024-02-20 ENCOUNTER — Other Ambulatory Visit: Payer: Self-pay | Admitting: Cardiovascular Disease

## 2024-02-20 ENCOUNTER — Ambulatory Visit (INDEPENDENT_AMBULATORY_CARE_PROVIDER_SITE_OTHER): Payer: Medicare Other | Admitting: Podiatry

## 2024-02-20 ENCOUNTER — Telehealth: Payer: Self-pay

## 2024-02-20 VITALS — Ht 67.0 in | Wt 187.2 lb

## 2024-02-20 DIAGNOSIS — M79675 Pain in left toe(s): Secondary | ICD-10-CM

## 2024-02-20 DIAGNOSIS — B351 Tinea unguium: Secondary | ICD-10-CM

## 2024-02-20 DIAGNOSIS — M79674 Pain in right toe(s): Secondary | ICD-10-CM

## 2024-02-20 DIAGNOSIS — I480 Paroxysmal atrial fibrillation: Secondary | ICD-10-CM

## 2024-02-20 NOTE — Telephone Encounter (Signed)
 Copied from CRM [id number]. Topic: Clinical - Prescription Issue >> Feb 20, 2024 11:54 AM Chuck Crater wrote: Reason for CRM: [facility] wants to know If clinic received  renewal prescription for patient [employer] She stated that it was sent on 04/25.

## 2024-02-20 NOTE — Telephone Encounter (Signed)
 Prescription refill request for Eliquis  received. Indication: Afib  Last office visit: 01/10/24 [name])  Scr: 2.43 (01/25/24)  Age: 83 Weight: 84.9kg  Appropriate dose.

## 2024-02-21 ENCOUNTER — Other Ambulatory Visit: Payer: Self-pay

## 2024-02-21 NOTE — Patient Outreach (Signed)
 Complex Care Management   Visit Note  02/21/2024  Name:  Jake Gross MRN: [id number] DOB: 1941/02/06  Situation: Referral received for Complex Care Management related to Heart Failure and HTN/ Right hip pain I obtained verbal consent from Patient.  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   CAD (coronary artery disease) 09/29/2019   Cervical spondylosis    Chronic anticoagulation    Chronic diastolic CHF (congestive heart failure) (HCC) 12/17/2018   CKD (chronic kidney disease) 12/17/2018   Diabetes mellitus    History of GI bleed 09/29/2019   Hyperlipidemia    Hypertension    Iron deficiency anemia due to chronic blood loss    Paroxysmal atrial fibrillation (HCC) 12/17/2018   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   Vitamin D deficiency     Assessment: Patient Reported Symptoms:  Cognitive Cognitive Status: Alert and oriented to person, place, and time, Insightful and able to interpret abstract concepts, Normal speech and language skills Cognitive/Intellectual Conditions Management [RPT]: None reported or documented in medical history or problem list   Health Maintenance Behaviors: Annual physical exam, Immunizations Healing Pattern: Average  Neurological Neurological Review of Symptoms: No symptoms reported Neurological Conditions: Stroke, ischemic (history of stroke) Neurological Management Strategies: Routine screening, Medication therapy Neurological Self-Management Outcome: 4 (good)  HEENT HEENT Symptoms Reported: No symptoms reported      Cardiovascular Cardiovascular Symptoms Reported: No symptoms reported Does patient have  uncontrolled Hypertension?: No Cardiovascular Conditions: Hypertension, Coronary artery disease, Heart failure (Atrial fibrillation) Cardiovascular Management Strategies: Routine screening, Medication therapy, Diet modification Cardiovascular Self-Management Outcome: 4 (good)  Respiratory Respiratory Symptoms Reported: No symptoms reported    Endocrine Patient reports the following symptoms related to hypoglycemia or hyperglycemia : No symptoms reported Is patient diabetic?: Yes Is patient checking blood sugars at home?: Yes Endocrine Conditions: Diabetes Endocrine Management Strategies: Routine screening, Medication therapy, Medical device Endocrine Self-Management Outcome: 4 (good) Endocrine Comment: Patient reports checking blood sugar 3-4 times a day with CGM.  Patient reports having occasional blood sugars <70.  Reports primary care provider adjusted Lantas to 25 units on 01/25/2024  Gastrointestinal Gastrointestinal Symptoms Reported: No symptoms reported      Genitourinary Genitourinary Symptoms Reported: No symptoms reported Genitourinary Conditions: Chronic kidney disease Genitourinary Management Strategies: Medication therapy (routine follow up) Genitourinary Self-Management Outcome: 4 (good)  Integumentary Integumentary Symptoms Reported: No symptoms reported    Musculoskeletal Musculoskelatal Symptoms Reviewed: Difficulty walking, Unsteady gait Musculoskeletal Conditions: Other Other Musculoskeletal Conditions: right hip pain Musculoskeletal Management Strategies: Medication therapy, Routine screening Musculoskeletal Self-Management Outcome: 3 (uncertain) Musculoskeletal Comment: Patient reports ongoing right hip pain. Last orthopedic visit 01/01/24.  No follow up visit scheduled. Falls in the past year?: Yes Number of falls in past year: 1 or less Was there an injury with Fall?: Yes Fall Risk Category Calculator: 2 Patient Fall Risk Level: Moderate Fall Risk Patient at  Risk for Falls Due to: History of fall(s), Impaired mobility, Impaired balance/gait Fall risk Follow up: Falls prevention discussed  Psychosocial Psychosocial Symptoms Reported: No symptoms reported     Quality of Family Relationships: supportive Do you feel physically threatened by others?: No  02/21/2024    3:32 PM  Depression screen PHQ 2/9  Decreased Interest 0  Down, Depressed, Hopeless 0  PHQ - 2 Score 0    There were no vitals filed for this visit.  Medications Reviewed Today     Reviewed by Zyier Dykema E, RN (Registered Nurse) on 02/21/24 at 1526  Med List Status: <None>   Medication Order Taking? Sig Documenting Provider Last Dose Status Informant  ACCU-CHEK GUIDE test strip [id number]  1 EACH BY OTHER ROUTE 3 (THREE) TIMES DAILY AS NEEDED FOR OTHER. USE AS INSTRUCTED Dorothe Gaster, NP  Active   acetaminophen  (TYLENOL ) 325 MG tablet [id number] Yes Take 2 tablets (650 mg total) by mouth every 6 (six) hours as needed for mild pain (or Fever >/= 101). Elgergawy, Ardia Kraft, MD Taking Active   amLODipine  (NORVASC ) 10 MG tablet [id number] Yes Take 10 mg by mouth daily. [provider] Taking Active   bacitracin  ointment [id number] Yes Apply 1 Application topically 2 (two) times daily. Ninetta Basket, MD Taking Active   chlorthalidone (HYGROTON) 25 MG tablet [id number] Yes Take 12.5 mg by mouth daily. [provider] Taking Active   Continuous Glucose Receiver (FREESTYLE LIBRE 2 READER) DEVI [id number]  Use to check blood sugars. Dorothe Gaster, NP  Active   Continuous Glucose Sensor (FREESTYLE LIBRE 2 SENSOR) [place] [id number]  Apply every 14 days to check blood sugars. Dorothe Gaster, NP  Active   dapagliflozin propanediol (FARXIGA) 10 MG TABS tablet [id number] Yes Take 10 mg by mouth daily. [provider] Taking Active   ELIQUIS  2.5 MG TABS tablet [id number] Yes TAKE 1 TABLET BY MOUTH TWICE A DAY Odie Benne, MD Taking Active   hydrALAZINE   (APRESOLINE ) 100 MG tablet [id number] Yes TAKE 1 TABLET BY MOUTH 3 TIMES DAILY. Marlyse Single T, PA-C Taking Active   insulin  glargine (LANTUS  SOLOSTAR) 100 UNIT/ML Solostar Pen [id number] Yes Inject 25 Units into the skin daily. Dorothe Gaster, NP Taking Active   Insulin  Pen Needle (BD PEN NEEDLE NANO 2ND GEN) 32G X 4 MM MISC [id number]  Use with insulin  pen as directed Dorothe Gaster, NP  Active Pharmacy Records, Child  isosorbide  mononitrate (IMDUR ) 60 MG 24 hr tablet [id number]  Take 1.5 tablets (90 mg total) by mouth daily. Marlyse Single T, PA-C  Expired 10/09/23 2359            Med Note Marrie Sizer, Leesha Veno E   Thu Feb 21, 2024  3:26 PM) Patient states he still takes this.   Lancets [facility] ULTRASOFT) lancets [id number]  Use as instructed Gwyndolyn Lerner, MD  Active Pharmacy Records, Child  olmesartan [facility]) 5 MG tablet [id number] Yes Take 5 mg by mouth daily. [provider] Taking Active   predniSONE  (STERAPRED UNI-PAK 21 TAB) 10 MG (21) TBPK tablet [id number] No Take as directed  Patient not taking: Reported on 02/21/2024   Wes Hamman, MD Not Taking Active   rosuvastatin  (CRESTOR ) 20 MG tablet [id number] Yes TAKE 1 TABLET BY MOUTH AT BEDTIME. SCHEDULE PHYSICAL EXAM Dorothe Gaster, NP Taking Active   torsemide  (DEMADEX ) 20 MG tablet [id number] Yes TAKE 1 TABLET BY MOUTH TWICE A DAY ON MONDAYS AND THURSDAYS ONLY, TAKE 1 TABLET BY MOUTH DAILY ONLY ALL OTHER DAYS Gabino Joe, PA-C Taking Active             Recommendation:   PCP Follow-up  Follow Up Plan:   Telephone follow-up in 1 month with RN case manager  Verba Girt RN, BSN, CCM [employer], Population Health Case Manager Phone: [phone]

## 2024-02-21 NOTE — Telephone Encounter (Signed)
 Contacted [facility] and was transferred to [employer] for [employer].  Rep did not have patient on file to send a fax of prescription renewal for Freestyle supplies.  Will try again later.

## 2024-02-24 NOTE — Progress Notes (Signed)
  Subjective:  Patient ID: Jake Gross, male    DOB: 16-Mar-1941,  MRN: [id number]  Jake Gross presents to clinic today for at risk foot care. Pt has h/o NIDDM with chronic kidney disease and painful, elongated thickened toenails x 10 which are symptomatic when wearing enclosed shoe gear. This interferes with his/her daily activities.  Chief Complaint  Patient presents with   Nail Problem    Pt is here for [facility] unsure of last A1C PCP is Dr Tivis Forster and LOV was in April.   New problem(s): None.   PCP is Dorothe Gaster, NP.  No Known Allergies  Review of Systems: Negative except as noted in the HPI.  Objective: No changes noted in today's physical examination. There were no vitals filed for this visit. Jake Gross is a pleasant 83 y.o. male in NAD. AAO x 3.  Vascular Examination: CFT <3 seconds b/l. DP/PT pulses faintly palpable b/l. Skin temperature gradient warm to warm b/l. No pain with calf compression. No ischemia or gangrene. No cyanosis or clubbing noted b/l.    Neurological Examination: Sensation grossly intact b/l with 10 gram monofilament. Vibratory sensation decreased b/l.  Dermatological Examination: Pedal skin warm and supple b/l.   No open wounds. No interdigital macerations.  Toenails 1-5 b/l thick, discolored, elongated with subungual debris and pain on dorsal palpation.    No corns, calluses nor porokeratotic lesions noted.  Musculoskeletal Examination: Muscle strength 5/5 to all lower extremity muscle groups bilaterally. No pain, crepitus or joint limitation noted with ROM bilateral LE. No gross bony deformities bilaterally.  Radiographs: None  Last A1c:      Latest Ref Rng & Units 01/25/2024    4:26 PM 10/12/2023    3:23 PM 06/11/2023    3:16 PM 03/05/2023    2:24 PM  Hemoglobin A1C  Hemoglobin-A1c 4.0 - 5.6 % 5.5  5.5  4.9  6.0     Assessment/Plan: 1. Pain due to onychomycosis of toenails of both feet     Consent given for treatment.  Patient examined. All patient's and/or POA's questions/concerns addressed on today's visit. Mycotic toenails 1-5 debrided in length and girth without incident. Continue foot and shoe inspections daily. Monitor blood glucose per PCP/Endocrinologist's recommendations.Continue soft, supportive shoe gear daily. Report any pedal injuries to medical professional. Call office if there are any quesitons/concerns. -Patient/POA to call should there be question/concern in the interim.   Return in about 3 months (around 05/22/2024).  Luella Sager, DPM      [place] LOCATION: [address] [place] [address]                   Office [phone]   [facility] LOCATION: [address] [place], [place] [address] Office [phone]

## 2024-03-03 DIAGNOSIS — N184 Chronic kidney disease, stage 4 (severe): Secondary | ICD-10-CM | POA: Diagnosis not present

## 2024-03-04 ENCOUNTER — Telehealth: Payer: Self-pay | Admitting: Nurse Practitioner

## 2024-03-04 NOTE — Telephone Encounter (Unsigned)
 Copied from CRM [id number]. Topic: Clinical - Order For Equipment >> Mar 04, 2024 10:38 AM Danae Duncans wrote: Reason for CRM: Jake Gross- [facility] advise they have fax over renewal prescription for freestyle libre sensor for pt but has not received a response- she can be reached at [phone]

## 2024-03-11 DIAGNOSIS — I129 Hypertensive chronic kidney disease with stage 1 through stage 4 chronic kidney disease, or unspecified chronic kidney disease: Secondary | ICD-10-CM | POA: Diagnosis not present

## 2024-03-11 DIAGNOSIS — I4891 Unspecified atrial fibrillation: Secondary | ICD-10-CM | POA: Diagnosis not present

## 2024-03-11 DIAGNOSIS — E1122 Type 2 diabetes mellitus with diabetic chronic kidney disease: Secondary | ICD-10-CM | POA: Diagnosis not present

## 2024-03-11 DIAGNOSIS — E872 Acidosis, unspecified: Secondary | ICD-10-CM | POA: Diagnosis not present

## 2024-03-11 DIAGNOSIS — N184 Chronic kidney disease, stage 4 (severe): Secondary | ICD-10-CM | POA: Diagnosis not present

## 2024-03-11 DIAGNOSIS — D631 Anemia in chronic kidney disease: Secondary | ICD-10-CM | POA: Diagnosis not present

## 2024-03-15 ENCOUNTER — Other Ambulatory Visit: Payer: Self-pay

## 2024-03-15 ENCOUNTER — Inpatient Hospital Stay (HOSPITAL_COMMUNITY)
Admission: EM | Admit: 2024-03-15 | Discharge: 2024-03-19 | DRG: 638 | Disposition: A | Discharge status: Home Health | Attending: Internal Medicine | Admitting: Internal Medicine

## 2024-03-15 ENCOUNTER — Emergency Department (HOSPITAL_COMMUNITY)

## 2024-03-15 DIAGNOSIS — E8721 Acute metabolic acidosis: Secondary | ICD-10-CM | POA: Diagnosis not present

## 2024-03-15 DIAGNOSIS — E16A2 Hypoglycemia level 2: Secondary | ICD-10-CM | POA: Diagnosis present

## 2024-03-15 DIAGNOSIS — E162 Hypoglycemia, unspecified: Principal | ICD-10-CM | POA: Diagnosis present

## 2024-03-15 DIAGNOSIS — Z823 Family history of stroke: Secondary | ICD-10-CM | POA: Diagnosis not present

## 2024-03-15 DIAGNOSIS — Z951 Presence of aortocoronary bypass graft: Secondary | ICD-10-CM | POA: Diagnosis not present

## 2024-03-15 DIAGNOSIS — I517 Cardiomegaly: Secondary | ICD-10-CM | POA: Diagnosis not present

## 2024-03-15 DIAGNOSIS — T383X5A Adverse effect of insulin and oral hypoglycemic [antidiabetic] drugs, initial encounter: Secondary | ICD-10-CM | POA: Diagnosis not present

## 2024-03-15 DIAGNOSIS — I5032 Chronic diastolic (congestive) heart failure: Secondary | ICD-10-CM | POA: Diagnosis not present

## 2024-03-15 DIAGNOSIS — Z87891 Personal history of nicotine dependence: Secondary | ICD-10-CM | POA: Diagnosis not present

## 2024-03-15 DIAGNOSIS — E875 Hyperkalemia: Secondary | ICD-10-CM | POA: Diagnosis not present

## 2024-03-15 DIAGNOSIS — N184 Chronic kidney disease, stage 4 (severe): Secondary | ICD-10-CM | POA: Diagnosis present

## 2024-03-15 DIAGNOSIS — E785 Hyperlipidemia, unspecified: Secondary | ICD-10-CM | POA: Diagnosis present

## 2024-03-15 DIAGNOSIS — R0689 Other abnormalities of breathing: Secondary | ICD-10-CM | POA: Diagnosis not present

## 2024-03-15 DIAGNOSIS — Z7984 Long term (current) use of oral hypoglycemic drugs: Secondary | ICD-10-CM

## 2024-03-15 DIAGNOSIS — I4891 Unspecified atrial fibrillation: Secondary | ICD-10-CM | POA: Diagnosis not present

## 2024-03-15 DIAGNOSIS — Z8249 Family history of ischemic heart disease and other diseases of the circulatory system: Secondary | ICD-10-CM

## 2024-03-15 DIAGNOSIS — I130 Hypertensive heart and chronic kidney disease with heart failure and stage 1 through stage 4 chronic kidney disease, or unspecified chronic kidney disease: Secondary | ICD-10-CM | POA: Diagnosis present

## 2024-03-15 DIAGNOSIS — Z7901 Long term (current) use of anticoagulants: Secondary | ICD-10-CM

## 2024-03-15 DIAGNOSIS — R0989 Other specified symptoms and signs involving the circulatory and respiratory systems: Secondary | ICD-10-CM | POA: Diagnosis not present

## 2024-03-15 DIAGNOSIS — D631 Anemia in chronic kidney disease: Secondary | ICD-10-CM | POA: Diagnosis not present

## 2024-03-15 DIAGNOSIS — E11649 Type 2 diabetes mellitus with hypoglycemia without coma: Principal | ICD-10-CM | POA: Diagnosis present

## 2024-03-15 DIAGNOSIS — E1122 Type 2 diabetes mellitus with diabetic chronic kidney disease: Secondary | ICD-10-CM | POA: Diagnosis present

## 2024-03-15 DIAGNOSIS — Z79899 Other long term (current) drug therapy: Secondary | ICD-10-CM

## 2024-03-15 DIAGNOSIS — Z794 Long term (current) use of insulin: Secondary | ICD-10-CM | POA: Diagnosis not present

## 2024-03-15 DIAGNOSIS — Z833 Family history of diabetes mellitus: Secondary | ICD-10-CM

## 2024-03-15 DIAGNOSIS — I2510 Atherosclerotic heart disease of native coronary artery without angina pectoris: Secondary | ICD-10-CM | POA: Diagnosis present

## 2024-03-15 DIAGNOSIS — Z8673 Personal history of transient ischemic attack (TIA), and cerebral infarction without residual deficits: Secondary | ICD-10-CM

## 2024-03-15 DIAGNOSIS — I10 Essential (primary) hypertension: Secondary | ICD-10-CM | POA: Diagnosis not present

## 2024-03-15 DIAGNOSIS — S42022A Displaced fracture of shaft of left clavicle, initial encounter for closed fracture: Secondary | ICD-10-CM | POA: Diagnosis not present

## 2024-03-15 DIAGNOSIS — R000 Tachycardia, unspecified: Secondary | ICD-10-CM | POA: Diagnosis not present

## 2024-03-15 DIAGNOSIS — I4821 Permanent atrial fibrillation: Secondary | ICD-10-CM | POA: Diagnosis not present

## 2024-03-15 DIAGNOSIS — R0602 Shortness of breath: Secondary | ICD-10-CM | POA: Diagnosis not present

## 2024-03-15 DIAGNOSIS — R231 Pallor: Secondary | ICD-10-CM | POA: Diagnosis not present

## 2024-03-15 LAB — CBG MONITORING, ED
Glucose-Capillary: 97 mg/dL (ref 70–99)
Glucose-Capillary: 52 mg/dL — ABNORMAL LOW (ref 70–99)
Glucose-Capillary: 135 mg/dL — ABNORMAL HIGH (ref 70–99)

## 2024-03-15 LAB — CBC WITH DIFFERENTIAL/PLATELET
Abs Immature Granulocytes: 0.03 10*3/uL (ref 0.00–0.07)
Basophils Absolute: 0.1 10*3/uL (ref 0.0–0.1)
Basophils Relative: 1 %
Eosinophils Absolute: 0.2 10*3/uL (ref 0.0–0.5)
Eosinophils Relative: 2 %
HCT: 29.6 % — ABNORMAL LOW (ref 39.0–52.0)
Hemoglobin: 9.1 g/dL — ABNORMAL LOW (ref 13.0–17.0)
Immature Granulocytes: 0 %
Lymphocytes Relative: 15 %
Lymphs Abs: 1.5 10*3/uL (ref 0.7–4.0)
MCH: 28.4 pg (ref 26.0–34.0)
MCHC: 30.7 g/dL (ref 30.0–36.0)
MCV: 92.5 fL (ref 80.0–100.0)
Monocytes Absolute: 1.2 10*3/uL — ABNORMAL HIGH (ref 0.1–1.0)
Monocytes Relative: 12 %
Neutro Abs: 7.1 10*3/uL (ref 1.7–7.7)
Neutrophils Relative %: 70 %
Platelets: 245 10*3/uL (ref 150–400)
RBC: 3.2 MIL/uL — ABNORMAL LOW (ref 4.22–5.81)
RDW: 14.1 % (ref 11.5–15.5)
WBC: 10 10*3/uL (ref 4.0–10.5)
nRBC: 0 % (ref 0.0–0.2)

## 2024-03-15 LAB — MAGNESIUM: Magnesium: 2.4 mg/dL (ref 1.7–2.4)

## 2024-03-15 LAB — COMPREHENSIVE METABOLIC PANEL WITH GFR
ALT: 11 U/L (ref 0–44)
AST: 21 U/L (ref 15–41)
Albumin: 3 g/dL — ABNORMAL LOW (ref 3.5–5.0)
Alkaline Phosphatase: 92 U/L (ref 38–126)
Anion gap: 8 (ref 5–15)
BUN: 43 mg/dL — ABNORMAL HIGH (ref 8–23)
CO2: 16 mmol/L — ABNORMAL LOW (ref 22–32)
Calcium: 8.4 mg/dL — ABNORMAL LOW (ref 8.9–10.3)
Chloride: 116 mmol/L — ABNORMAL HIGH (ref 98–111)
Creatinine, Ser: 2.77 mg/dL — ABNORMAL HIGH (ref 0.61–1.24)
GFR, Estimated: 22 mL/min — ABNORMAL LOW (ref >60)
Glucose, Bld: 80 mg/dL (ref 70–99)
Potassium: 6.6 mmol/L (ref 3.5–5.1)
Sodium: 140 mmol/L (ref 135–145)
Total Bilirubin: 0.7 mg/dL (ref 0.0–1.2)
Total Protein: 6.4 g/dL — ABNORMAL LOW (ref 6.5–8.1)

## 2024-03-15 LAB — TROPONIN I (HIGH SENSITIVITY)
Troponin I (High Sensitivity): 18 ng/L — ABNORMAL HIGH (ref <18)
Troponin I (High Sensitivity): 17 ng/L (ref <18)

## 2024-03-15 LAB — T4, FREE: Free T4: 0.86 ng/dL (ref 0.61–1.12)

## 2024-03-15 LAB — POTASSIUM: Potassium: 6.1 mmol/L — ABNORMAL HIGH (ref 3.5–5.1)

## 2024-03-15 LAB — TSH: TSH: 2.454 u[IU]/mL (ref 0.350–4.500)

## 2024-03-15 MED ORDER — CALCIUM GLUCONATE-NACL 1-0.675 GM/50ML-% IV SOLN
1.0000 g | Freq: Once | INTRAVENOUS | Status: AC
  Administered 2024-03-15: 1000 mg via INTRAVENOUS
  Filled 2024-03-15: qty 50

## 2024-03-15 MED ORDER — DEXTROSE 50 % IV SOLN
1.0000 | Freq: Once | INTRAVENOUS | Status: AC
  Administered 2024-03-15: 50 mL via INTRAVENOUS
  Filled 2024-03-15: qty 50

## 2024-03-15 MED ORDER — SODIUM ZIRCONIUM CYCLOSILICATE 10 G PO PACK
10.0000 g | PACK | Freq: Once | ORAL | Status: AC
  Administered 2024-03-15: 10 g via ORAL
  Filled 2024-03-15: qty 1

## 2024-03-15 MED ORDER — ALBUTEROL SULFATE (2.5 MG/3ML) 0.083% IN NEBU
10.0000 mg | INHALATION_SOLUTION | Freq: Once | RESPIRATORY_TRACT | Status: AC
  Administered 2024-03-15: 10 mg via RESPIRATORY_TRACT
  Filled 2024-03-15: qty 12

## 2024-03-15 MED ORDER — SODIUM CHLORIDE 0.9 % IV BOLUS
500.0000 mL | Freq: Once | INTRAVENOUS | Status: AC
Start: 2024-03-15 — End: 2024-03-15
  Administered 2024-03-15: 500 mL via INTRAVENOUS

## 2024-03-15 MED ORDER — FUROSEMIDE 10 MG/ML IJ SOLN
40.0000 mg | Freq: Once | INTRAMUSCULAR | Status: AC
  Administered 2024-03-15: 40 mg via INTRAVENOUS
  Filled 2024-03-15: qty 4

## 2024-03-15 MED ORDER — INSULIN ASPART 100 UNIT/ML IV SOLN: 5.0000 [IU] | Freq: Once | INTRAVENOUS | Status: DC

## 2024-03-15 NOTE — ED Notes (Signed)
Pt given 2 apple juices. 

## 2024-03-15 NOTE — Discharge Instructions (Signed)
 You were seen in the emergency department for low blood sugar We observed you for several hours and your blood sugar did dip low again We gave you dextrose  and juice and did improve We also treated you for high potassium (hyperkalemia) Your potassium level improved You will need to have your labs rechecked this week by your primary doctor Continue to take all previous prescribed medications and check your blood sugars at home Return to the emergency department for repeatedly low blood sugars that did not resolve after you eat and drink something

## 2024-03-15 NOTE — ED Provider Notes (Signed)
 [place] EMERGENCY DEPARTMENT AT [facility] Provider Note   CSN: [id number] Arrival date & time: 03/15/24  1837     History  Chief Complaint  Patient presents with   Hypoglycemia    Ladislav S Pistilli is a 83 y.o. male.  With a history of atrial fibrillation on Eliquis , HFpEF, CKD, type 2 diabetes, CABG and CVA presents to the ED for hypoglycemia.  Patient reports that he took his insulin  as directed earlier today and ate meals.  His meter repeatedly read in the 50s at home when he called EMS.  Was noted to be slightly tachypneic for EMS and in atrial fibrillation which she has a history of.  Denies chest pain, shortness of breath, nausea, Vomiting, fevers chills or recent illness   Hypoglycemia      Home Medications Prior to Admission medications   Medication Sig Start Date End Date Taking? Authorizing Provider  ACCU-CHEK GUIDE test strip 1 EACH BY OTHER ROUTE 3 (THREE) TIMES DAILY AS NEEDED FOR OTHER. USE AS INSTRUCTED 04/23/23   Dorothe Gaster, NP  acetaminophen  (TYLENOL ) 325 MG tablet Take 2 tablets (650 mg total) by mouth every 6 (six) hours as needed for mild pain (or Fever >/= 101). 02/05/23   Elgergawy, Ardia Kraft, MD  amLODipine  (NORVASC ) 10 MG tablet Take 10 mg by mouth daily.    [provider]  bacitracin  ointment Apply 1 Application topically 2 (two) times daily. 07/22/23   Ninetta Basket, MD  chlorthalidone (HYGROTON) 25 MG tablet Take 12.5 mg by mouth daily. 08/21/23   [provider]  Continuous Glucose Receiver (FREESTYLE LIBRE 2 READER) DEVI Use to check blood sugars. 03/30/23   Dorothe Gaster, NP  Continuous Glucose Sensor (FREESTYLE LIBRE 2 SENSOR) MISC Apply every 14 days to check blood sugars. 03/30/23   Dorothe Gaster, NP  dapagliflozin propanediol (FARXIGA) 10 MG TABS tablet Take 10 mg by mouth daily.    [provider]  ELIQUIS  2.5 MG TABS tablet TAKE 1 TABLET BY MOUTH TWICE A DAY 02/20/24   Odie Benne, MD   hydrALAZINE  (APRESOLINE ) 100 MG tablet TAKE 1 TABLET BY MOUTH 3 TIMES DAILY. 10/15/23   Marlyse Single T, PA-C  insulin  glargine (LANTUS  SOLOSTAR) 100 UNIT/ML Solostar Pen Inject 25 Units into the skin daily. 01/25/24   Dorothe Gaster, NP  Insulin  Pen Needle (BD PEN NEEDLE NANO 2ND GEN) 32G X 4 MM MISC Use with insulin  pen as directed 01/19/22   Dorothe Gaster, NP  isosorbide  mononitrate (IMDUR ) 60 MG 24 hr tablet Take 1.5 tablets (90 mg total) by mouth daily. 07/11/23 10/09/23  Marlyse Single T, PA-C  Lancets [facility] ULTRASOFT) lancets Use as instructed 04/27/21   Gwyndolyn Lerner, MD  olmesartan (BENICAR) 5 MG tablet Take 5 mg by mouth daily. 11/01/23   [provider]  predniSONE  (STERAPRED UNI-PAK 21 TAB) 10 MG (21) TBPK tablet Take as directed Patient not taking: Reported on 02/21/2024 10/30/23   Wes Hamman, MD  rosuvastatin  (CRESTOR ) 20 MG tablet TAKE 1 TABLET BY MOUTH AT BEDTIME. SCHEDULE PHYSICAL EXAM 10/04/23   Dorothe Gaster, NP  torsemide  (DEMADEX ) 20 MG tablet TAKE 1 TABLET BY MOUTH TWICE A DAY ON MONDAYS AND THURSDAYS ONLY, TAKE 1 TABLET BY MOUTH DAILY ONLY ALL OTHER DAYS 04/25/23   Marlyse Single T, PA-C      Allergies    Patient has no known allergies.    Review of Systems   Review of Systems  Physical Exam Updated Vital Signs BP 122/87   Pulse (!) 56   Temp 98 F (36.7 C)   Resp 20   Ht 5\' 7"  (1.702 m)   Wt 85 kg   SpO2 100%   BMI 29.35 kg/m  Physical Exam Vitals and nursing note reviewed.  HENT:     Head: Normocephalic and atraumatic.  Eyes:     Pupils: Pupils are equal, round, and reactive to light.  Cardiovascular:     Rate and Rhythm: Normal rate and regular rhythm.  Pulmonary:     Effort: Pulmonary effort is normal.     Breath sounds: Normal breath sounds.  Abdominal:     Palpations: Abdomen is soft.     Tenderness: There is no abdominal tenderness.  Skin:    General: Skin is warm and dry.  Neurological:     Mental Status: He is alert.   Psychiatric:        Mood and Affect: Mood normal.     ED Results / Procedures / Treatments   Labs (all labs ordered are listed, but only abnormal results are displayed) Labs Reviewed  COMPREHENSIVE METABOLIC PANEL WITH GFR - Abnormal; Notable for the following components:      Result Value   Potassium 6.6 (*)    Chloride 116 (*)    CO2 16 (*)    BUN 43 (*)    Creatinine, Ser 2.77 (*)    Calcium  8.4 (*)    Total Protein 6.4 (*)    Albumin  3.0 (*)    GFR, Estimated 22 (*)    All other components within normal limits  CBC WITH DIFFERENTIAL/PLATELET - Abnormal; Notable for the following components:   RBC 3.20 (*)    Hemoglobin 9.1 (*)    HCT 29.6 (*)    Monocytes Absolute 1.2 (*)    All other components within normal limits  POTASSIUM - Abnormal; Notable for the following components:   Potassium 6.1 (*)    All other components within normal limits  CBG MONITORING, ED - Abnormal; Notable for the following components:   Glucose-Capillary 52 (*)    All other components within normal limits  CBG MONITORING, ED - Abnormal; Notable for the following components:   Glucose-Capillary 135 (*)    All other components within normal limits  TROPONIN I (HIGH SENSITIVITY) - Abnormal; Notable for the following components:   Troponin I (High Sensitivity) 18 (*)    All other components within normal limits  MAGNESIUM   TSH  T4, FREE  BASIC METABOLIC PANEL WITH GFR  CBG MONITORING, ED  TROPONIN I (HIGH SENSITIVITY)    EKG EKG Interpretation Date/Time:  Saturday Mar 15 2024 18:50:56 EDT Ventricular Rate:  78 PR Interval:  294 QRS Duration:  94 QT Interval:  384 QTC Calculation: 374 R Axis:   93  Text Interpretation: Sinus rhythm Supraventricular bigeminy Prolonged PR interval Right axis deviation Borderline repolarization abnormality Confirmed by Rafael Bun [phone]) on 03/15/2024 7:44:20 PM  Radiology DG Chest Portable 1 View Result Date: 03/15/2024 CLINICAL DATA:  Shortness  of breath. EXAM: PORTABLE CHEST 1 VIEW COMPARISON:  Chest x-ray 02/01/2023. FINDINGS: The heart is enlarged. There is central pulmonary vascular congestion. There is no focal lung infiltrate, pleural effusion or pneumothorax. Patient is status post cardiac surgery. Loop recorder device overlies the left chest. There is an acute appearing mid left clavicular fracture with 1 shaft width inferior displacement of the distal fracture fragment. IMPRESSION: 1. Cardiomegaly with central pulmonary vascular congestion. 2.  Acute appearing mid left clavicular fracture. Please correlate clinically. Electronically Signed   By: Tyron Gallon M.D.   On: 03/15/2024 19:32    Procedures .Critical Care  Performed by: Sallyanne Creamer, DO Authorized by: Sallyanne Creamer, DO   Critical care provider statement:    Critical care time (minutes):  35   Critical care was necessary to treat or prevent imminent or life-threatening deterioration of the following conditions:  Metabolic crisis   Critical care was time spent personally by me on the following activities:  Development of treatment plan with patient or surrogate, discussions with consultants, evaluation of patient's response to treatment, examination of patient, ordering and review of laboratory studies, ordering and review of radiographic studies, ordering and performing treatments and interventions, pulse oximetry, re-evaluation of patient's condition, review of old charts and obtaining history from patient or surrogate   I assumed direction of critical care for this patient from another provider in my specialty: no       Medications Ordered in ED Medications  calcium  gluconate 1 g/ 50 mL sodium chloride  IVPB (0 mg Intravenous Stopped 03/15/24 2328)  furosemide  (LASIX ) injection 40 mg (40 mg Intravenous Given 03/15/24 2231)  sodium zirconium cyclosilicate (LOKELMA) packet 10 g (10 g Oral Given 03/15/24 2231)  albuterol  (PROVENTIL ) (2.5 MG/3ML) 0.083% nebulizer  solution 10 mg (10 mg Nebulization Given 03/15/24 2228)  dextrose  50 % solution 50 mL (50 mLs Intravenous Given 03/15/24 2231)  sodium chloride  0.9 % bolus 500 mL (0 mLs Intravenous Stopped 03/15/24 2309)    ED Course/ Medical Decision Making/ A&P Clinical Course as of 03/15/24 2357  Sat Mar 15, 2024  2215 Hyperkalemia on labs.  Will treat with albuterol  dextrose  Lasix  Lokelma.  Will give calcium  and a small bolus of IV fluids given increase in creatinine from baseline.  Will recheck potassium thereafter.  Will obtain delta troponin [MP]  2230 Another episode of hypoglycemia here.  Improvement after juice and dextrose  p.o. intake. [MP]  2355 - Troponin flat.  Low special for ACS.  Patient has remained stable mentating well here.  Interventions for hyper kalemia have been completed   I, Rafael Bun DO, am transitioning care of this patient to the oncoming provider pending repeat basic metabolic panel following interventions for hyperkalemia, reevaluation and disposition [MP]    Clinical Course User Index [MP] Sallyanne Creamer, DO                                 Medical Decision Making 83 year old male with history as above presenting for hypoglycemia.  Compliant with insulin .  No hypoglycemia here.  Given his extensive cardiac history will obtain cardiac workup including chest x-ray EKG troponin metabolic panel magnesium .  He was reported to be in new onset A-fib but he does have a history of A-fib for which he takes Eliquis .  A-fib rate controlled here.  Amount and/or Complexity of Data Reviewed Labs: ordered. Radiology: ordered.  Risk OTC drugs. Prescription drug management.           Final Clinical Impression(s) / ED Diagnoses Final diagnoses:  Hypoglycemia  Hyperkalemia    Rx / DC Orders ED Discharge Orders     None         Sallyanne Creamer, DO 03/15/24 2357

## 2024-03-15 NOTE — ED Notes (Signed)
CCMD notified

## 2024-03-15 NOTE — ED Triage Notes (Signed)
 Called EMS due to lower glucose. Reports took insulin  per usual today. His meter is reading 54 whereas multiple checks with EMS >100. Alert and oriented. Denies pain. EMS found to be tachypneic and in a new a fib.

## 2024-03-16 ENCOUNTER — Encounter (HOSPITAL_COMMUNITY): Payer: Self-pay | Admitting: Internal Medicine

## 2024-03-16 DIAGNOSIS — Z7901 Long term (current) use of anticoagulants: Secondary | ICD-10-CM | POA: Diagnosis not present

## 2024-03-16 DIAGNOSIS — E1122 Type 2 diabetes mellitus with diabetic chronic kidney disease: Secondary | ICD-10-CM | POA: Diagnosis present

## 2024-03-16 DIAGNOSIS — Z823 Family history of stroke: Secondary | ICD-10-CM | POA: Diagnosis not present

## 2024-03-16 DIAGNOSIS — D631 Anemia in chronic kidney disease: Secondary | ICD-10-CM | POA: Diagnosis present

## 2024-03-16 DIAGNOSIS — E162 Hypoglycemia, unspecified: Secondary | ICD-10-CM | POA: Diagnosis not present

## 2024-03-16 DIAGNOSIS — T383X5A Adverse effect of insulin and oral hypoglycemic [antidiabetic] drugs, initial encounter: Secondary | ICD-10-CM | POA: Diagnosis present

## 2024-03-16 DIAGNOSIS — I2510 Atherosclerotic heart disease of native coronary artery without angina pectoris: Secondary | ICD-10-CM | POA: Diagnosis present

## 2024-03-16 DIAGNOSIS — E16A2 Hypoglycemia level 2: Secondary | ICD-10-CM | POA: Diagnosis present

## 2024-03-16 DIAGNOSIS — Z7984 Long term (current) use of oral hypoglycemic drugs: Secondary | ICD-10-CM | POA: Diagnosis not present

## 2024-03-16 DIAGNOSIS — Z79899 Other long term (current) drug therapy: Secondary | ICD-10-CM | POA: Diagnosis not present

## 2024-03-16 DIAGNOSIS — I5032 Chronic diastolic (congestive) heart failure: Secondary | ICD-10-CM | POA: Diagnosis present

## 2024-03-16 DIAGNOSIS — Z794 Long term (current) use of insulin: Secondary | ICD-10-CM | POA: Diagnosis not present

## 2024-03-16 DIAGNOSIS — I4821 Permanent atrial fibrillation: Secondary | ICD-10-CM | POA: Diagnosis present

## 2024-03-16 DIAGNOSIS — Z87891 Personal history of nicotine dependence: Secondary | ICD-10-CM | POA: Diagnosis not present

## 2024-03-16 DIAGNOSIS — E8721 Acute metabolic acidosis: Secondary | ICD-10-CM | POA: Diagnosis present

## 2024-03-16 DIAGNOSIS — Z951 Presence of aortocoronary bypass graft: Secondary | ICD-10-CM | POA: Diagnosis not present

## 2024-03-16 DIAGNOSIS — E875 Hyperkalemia: Secondary | ICD-10-CM | POA: Diagnosis present

## 2024-03-16 DIAGNOSIS — N184 Chronic kidney disease, stage 4 (severe): Secondary | ICD-10-CM | POA: Diagnosis present

## 2024-03-16 DIAGNOSIS — Z8673 Personal history of transient ischemic attack (TIA), and cerebral infarction without residual deficits: Secondary | ICD-10-CM | POA: Diagnosis not present

## 2024-03-16 DIAGNOSIS — E11649 Type 2 diabetes mellitus with hypoglycemia without coma: Secondary | ICD-10-CM | POA: Diagnosis present

## 2024-03-16 DIAGNOSIS — E785 Hyperlipidemia, unspecified: Secondary | ICD-10-CM | POA: Diagnosis present

## 2024-03-16 DIAGNOSIS — I130 Hypertensive heart and chronic kidney disease with heart failure and stage 1 through stage 4 chronic kidney disease, or unspecified chronic kidney disease: Secondary | ICD-10-CM | POA: Diagnosis present

## 2024-03-16 DIAGNOSIS — Z833 Family history of diabetes mellitus: Secondary | ICD-10-CM | POA: Diagnosis not present

## 2024-03-16 DIAGNOSIS — Z8249 Family history of ischemic heart disease and other diseases of the circulatory system: Secondary | ICD-10-CM | POA: Diagnosis not present

## 2024-03-16 LAB — BASIC METABOLIC PANEL WITH GFR
Anion gap: 7 (ref 5–15)
Anion gap: 9 (ref 5–15)
BUN: 42 mg/dL — ABNORMAL HIGH (ref 8–23)
BUN: 40 mg/dL — ABNORMAL HIGH (ref 8–23)
CO2: 17 mmol/L — ABNORMAL LOW (ref 22–32)
CO2: 16 mmol/L — ABNORMAL LOW (ref 22–32)
Calcium: 8.6 mg/dL — ABNORMAL LOW (ref 8.9–10.3)
Calcium: 8.6 mg/dL — ABNORMAL LOW (ref 8.9–10.3)
Chloride: 116 mmol/L — ABNORMAL HIGH (ref 98–111)
Chloride: 114 mmol/L — ABNORMAL HIGH (ref 98–111)
Creatinine, Ser: 2.74 mg/dL — ABNORMAL HIGH (ref 0.61–1.24)
Creatinine, Ser: 2.56 mg/dL — ABNORMAL HIGH (ref 0.61–1.24)
GFR, Estimated: 23 mL/min — ABNORMAL LOW (ref >60)
GFR, Estimated: 24 mL/min — ABNORMAL LOW (ref >60)
Glucose, Bld: 116 mg/dL — ABNORMAL HIGH (ref 70–99)
Glucose, Bld: 62 mg/dL — ABNORMAL LOW (ref 70–99)
Potassium: 5.5 mmol/L — ABNORMAL HIGH (ref 3.5–5.1)
Potassium: 5.4 mmol/L — ABNORMAL HIGH (ref 3.5–5.1)
Sodium: 140 mmol/L (ref 135–145)
Sodium: 139 mmol/L (ref 135–145)

## 2024-03-16 LAB — URINALYSIS, ROUTINE W REFLEX MICROSCOPIC
Bacteria, UA: NONE SEEN
Bilirubin Urine: NEGATIVE
Glucose, UA: 150 mg/dL — AB
Hgb urine dipstick: NEGATIVE
Ketones, ur: NEGATIVE mg/dL
Leukocytes,Ua: NEGATIVE
Nitrite: NEGATIVE
Protein, ur: 100 mg/dL — AB
Specific Gravity, Urine: 1.011 (ref 1.005–1.030)
pH: 6 (ref 5.0–8.0)

## 2024-03-16 LAB — GLUCOSE, CAPILLARY
Glucose-Capillary: 46 mg/dL — ABNORMAL LOW (ref 70–99)
Glucose-Capillary: 47 mg/dL — ABNORMAL LOW (ref 70–99)
Glucose-Capillary: 112 mg/dL — ABNORMAL HIGH (ref 70–99)
Glucose-Capillary: 54 mg/dL — ABNORMAL LOW (ref 70–99)
Glucose-Capillary: 69 mg/dL — ABNORMAL LOW (ref 70–99)
Glucose-Capillary: 96 mg/dL (ref 70–99)
Glucose-Capillary: 70 mg/dL (ref 70–99)
Glucose-Capillary: 72 mg/dL (ref 70–99)
Glucose-Capillary: 103 mg/dL — ABNORMAL HIGH (ref 70–99)
Glucose-Capillary: 114 mg/dL — ABNORMAL HIGH (ref 70–99)
Glucose-Capillary: 131 mg/dL — ABNORMAL HIGH (ref 70–99)
Glucose-Capillary: 129 mg/dL — ABNORMAL HIGH (ref 70–99)

## 2024-03-16 LAB — HEMOGLOBIN A1C
Hgb A1c MFr Bld: 4.9 % (ref 4.8–5.6)
Mean Plasma Glucose: 93.93 mg/dL

## 2024-03-16 LAB — CBC
HCT: 29.7 % — ABNORMAL LOW (ref 39.0–52.0)
Hemoglobin: 9.1 g/dL — ABNORMAL LOW (ref 13.0–17.0)
MCH: 28.6 pg (ref 26.0–34.0)
MCHC: 30.6 g/dL (ref 30.0–36.0)
MCV: 93.4 fL (ref 80.0–100.0)
Platelets: 211 10*3/uL (ref 150–400)
RBC: 3.18 MIL/uL — ABNORMAL LOW (ref 4.22–5.81)
RDW: 14.1 % (ref 11.5–15.5)
WBC: 8.6 10*3/uL (ref 4.0–10.5)
nRBC: 0 % (ref 0.0–0.2)

## 2024-03-16 LAB — CBG MONITORING, ED
Glucose-Capillary: 64 mg/dL — ABNORMAL LOW (ref 70–99)
Glucose-Capillary: 79 mg/dL (ref 70–99)

## 2024-03-16 LAB — PHOSPHORUS: Phosphorus: 5.1 mg/dL — ABNORMAL HIGH (ref 2.5–4.6)

## 2024-03-16 MED ORDER — PROCHLORPERAZINE EDISYLATE 10 MG/2ML IJ SOLN: 5.0000 mg | Freq: Four times a day (QID) | INTRAMUSCULAR | Status: DC | PRN

## 2024-03-16 MED ORDER — SODIUM BICARBONATE 650 MG PO TABS
650.0000 mg | ORAL_TABLET | Freq: Three times a day (TID) | ORAL | Status: DC
  Administered 2024-03-16 (×3): 650 mg via ORAL
  Filled 2024-03-16 (×3): qty 1

## 2024-03-16 MED ORDER — APIXABAN 2.5 MG PO TABS
2.5000 mg | ORAL_TABLET | Freq: Two times a day (BID) | ORAL | Status: DC
  Administered 2024-03-16 – 2024-03-19 (×8): 2.5 mg via ORAL
  Filled 2024-03-16 (×8): qty 1

## 2024-03-16 MED ORDER — ROSUVASTATIN CALCIUM 20 MG PO TABS
20.0000 mg | ORAL_TABLET | Freq: Every day | ORAL | Status: DC
  Administered 2024-03-16 – 2024-03-19 (×4): 20 mg via ORAL
  Filled 2024-03-16 (×4): qty 1

## 2024-03-16 MED ORDER — MELATONIN 5 MG PO TABS: 5.0000 mg | ORAL_TABLET | Freq: Every evening | ORAL | Status: DC | PRN

## 2024-03-16 MED ORDER — DEXTROSE 5 % IV SOLN: INTRAVENOUS | Status: DC

## 2024-03-16 MED ORDER — POLYETHYLENE GLYCOL 3350 17 G PO PACK: 17.0000 g | PACK | Freq: Every day | ORAL | Status: DC | PRN

## 2024-03-16 MED ORDER — DEXTROSE 50 % IV SOLN
INTRAVENOUS | Status: AC
  Administered 2024-03-16: 50 mL
  Filled 2024-03-16: qty 50

## 2024-03-16 MED ORDER — HYDRALAZINE HCL 10 MG PO TABS
10.0000 mg | ORAL_TABLET | Freq: Three times a day (TID) | ORAL | Status: DC
  Administered 2024-03-16 – 2024-03-19 (×11): 10 mg via ORAL
  Filled 2024-03-16 (×11): qty 1

## 2024-03-16 MED ORDER — SODIUM ZIRCONIUM CYCLOSILICATE 10 G PO PACK
10.0000 g | PACK | Freq: Two times a day (BID) | ORAL | Status: AC
  Administered 2024-03-16 (×2): 10 g via ORAL
  Filled 2024-03-16 (×2): qty 1

## 2024-03-16 MED ORDER — GLUCOSE 40 % PO GEL
2.0000 | ORAL | Status: AC
Start: 2024-03-16 — End: 2024-03-17
  Filled 2024-03-16: qty 2.42

## 2024-03-16 MED ORDER — HYDRALAZINE HCL 20 MG/ML IJ SOLN: 2.0000 mg | Freq: Four times a day (QID) | INTRAMUSCULAR | Status: DC | PRN

## 2024-03-16 MED ORDER — SODIUM CHLORIDE 0.9 % IV SOLN: INTRAVENOUS | Status: DC

## 2024-03-16 MED ORDER — ACETAMINOPHEN 325 MG PO TABS: 650.0000 mg | ORAL_TABLET | Freq: Four times a day (QID) | ORAL | Status: DC | PRN

## 2024-03-16 NOTE — Plan of Care (Signed)

## 2024-03-16 NOTE — ED Provider Notes (Signed)
 I assumed care at signout. Recheck glucose shows it is back down to 64. Due to declining glucose, patient will be admitted.  He has been given juice to drink Patient tells me yesterday he took his insulin  after his glucose was 52.  Patient may not be taking his medications appropriately and he is high risk due to his underlying renal insufficiency.  His hyperkalemia is improved Patient will benefit from admission, monitoring, reassessment of his diabetic medications   Eldon Greenland, MD 03/16/24 0210

## 2024-03-16 NOTE — ED Provider Notes (Signed)
 D/w dr hall, she will admit patient, treat his hyperKalemia and also hypoglycemia.  He may also need diabetic med teaching   Eldon Greenland, MD 03/16/24 [phone]

## 2024-03-16 NOTE — Plan of Care (Signed)
  Problem: Clinical Measurements: Goal: Diagnostic test results will improve Outcome: Progressing   Problem: Pain Managment: Goal: General experience of comfort will improve and/or be controlled Outcome: Progressing   Problem: Safety: Goal: Ability to remain free from injury will improve Outcome: Progressing   Problem: Skin Integrity: Goal: Risk for impaired skin integrity will decrease Outcome: Progressing

## 2024-03-16 NOTE — Evaluation (Signed)
 Physical Therapy Evaluation Patient Details Name: Jake Gross MRN: [id number] DOB: 09-06-1941 Today's Date: 03/16/2024  History of Present Illness  Jake Gross is a 83 y.o. male who presents to the ER from home via EMS due to recurrent hypoglycemia with his home meter reading blood sugars in the 50s; with medical history significant for CKD 4, essential hypertension, hyperlipidemia, insulin -dependent type 2 diabetes, chronic HFpEF, coronary artery disease status post CABG in 2020, persistent atrial fibrillation on Eliquis   Clinical Impression   Pt admitted with above diagnosis. Lives at home alone, in a single-level home with a few steps to enter; Prior to admission, pt was able to manage independently, drive, complete ADLs and IADLs; Presents to PT with generalized weakness and gait unsteadiness; Able to stand without physical assist, however needs close guard for safety; unsteady walking into the bathroom; Recommend using RW for steadiness with amb;  At this point, rec HHPT follow up; Pt currently with functional limitations due to the deficits listed below (see PT Problem List). Pt will benefit from skilled PT to increase their independence and safety with mobility to allow discharge to the venue listed below.           If plan is discharge home, recommend the following: A little help with walking and/or transfers;Assistance with cooking/housework;Assist for transportation   Can travel by private vehicle        Equipment Recommendations Rolling walker (2 wheels);BSC/3in1  Recommendations for Other Services  OT consult    Functional Status Assessment Patient has had a recent decline in their functional status and demonstrates the ability to make significant improvements in function in a reasonable and predictable amount of time.     Precautions / Restrictions Precautions Precautions: Fall Recall of Precautions/Restrictions: Intact Restrictions Weight Bearing Restrictions  Per Provider Order: No      Mobility  Bed Mobility Overal bed mobility: Needs Assistance Bed Mobility: Supine to Sit     Supine to sit: Supervision     General bed mobility comments: noting breath hodling and dependence on momentum    Transfers Overall transfer level: Needs assistance Equipment used: None Transfers: Sit to/from Stand Sit to Stand: Contact guard assist           General transfer comment: Slow rise; stood somehat impulsively    Ambulation/Gait Ambulation/Gait assistance: Min assist, Mod assist Gait Distance (Feet): 20 Feet (to and from bathroom) Assistive device: None Gait Pattern/deviations: Step-through pattern, Drifts right/left       General Gait Details: Unsteady and reaching out for UE support; some small losses of balance and one notable LOB requiring Mod assist to regain balance  Stairs            Wheelchair Mobility     Tilt Bed    Modified Rankin (Stroke Patients Only)       Balance Overall balance assessment: Needs assistance   Sitting balance-Leahy Scale: Fair       Standing balance-Leahy Scale: Poor                               Pertinent Vitals/Pain Pain Assessment Pain Assessment: Faces Faces Pain Scale: Hurts a little bit Pain Location: R UE Pain Descriptors / Indicators: Sore Pain Intervention(s): Monitored during session    Home Living Family/patient expects to be discharged to:: Private residence Living Arrangements: Alone Available Help at Discharge: Family;Available PRN/intermittently Type of Home: House Home Access: Stairs to enter Entrance  Stairs-Rails: Doctor, general practice of Steps: 5   Home Layout: One level Home Equipment: Cane - single Librarian, academic (2 wheels) Additional Comments: reports he is still driving    Prior Function Prior Level of Function : Independent/Modified Independent             Mobility Comments: Has not been needing his RW for amb  in the time since last admission ADLs Comments: Reports independence with ADLs still driving     Extremity/Trunk Assessment   Upper Extremity Assessment Upper Extremity Assessment: Overall WFL for tasks assessed;Right hand dominant;RUE deficits/detail RUE Deficits / Details: Painful, but can still use RUE with basic ADLs    Lower Extremity Assessment Lower Extremity Assessment: Generalized weakness       Communication   Communication Communication: No apparent difficulties    Cognition Arousal: Alert Behavior During Therapy: WFL for tasks assessed/performed   PT - Cognitive impairments: No apparent impairments                         Following commands: Intact       Cueing Cueing Techniques: Verbal cues     General Comments General comments (skin integrity, edema, etc.): tendign to reach out for UE support    Exercises     Assessment/Plan    PT Assessment Patient needs continued PT services  PT Problem List Decreased strength;Decreased activity tolerance;Decreased balance;Decreased mobility;Decreased coordination;Decreased cognition;Decreased knowledge of use of DME;Decreased safety awareness;Decreased knowledge of precautions       PT Treatment Interventions DME instruction;Gait training;Stair training;Functional mobility training;Therapeutic activities;Therapeutic exercise;Balance training;Neuromuscular re-education;Cognitive remediation;Patient/family education    PT Goals (Current goals can be found in the Care Plan section)  Acute Rehab PT Goals Patient Stated Goal: Hopes to get home soon PT Goal Formulation: With patient Time For Goal Achievement: 03/30/24 Potential to Achieve Goals: Good    Frequency Min 3X/week     Co-evaluation               AM-PAC PT "6 Clicks" Mobility  Outcome Measure Help needed turning from your back to your side while in a flat bed without using bedrails?: None Help needed moving from lying on your back to  sitting on the side of a flat bed without using bedrails?: A Little Help needed moving to and from a bed to a chair (including a wheelchair)?: A Little Help needed standing up from a chair using your arms (e.g., wheelchair or bedside chair)?: A Little Help needed to walk in hospital room?: A Lot Help needed climbing 3-5 steps with a railing? : A Lot 6 Click Score: 17    End of Session Equipment Utilized During Treatment: Gait belt Activity Tolerance: Patient tolerated treatment well Patient left: in bed;with call bell/phone within reach;with nursing/sitter in room (sitting EOB with RN) Nurse Communication: Mobility status PT Visit Diagnosis: Unsteadiness on feet (R26.81)    Time: 1610-9604 PT Time Calculation (min) (ACUTE ONLY): 13 min   Charges:   PT Evaluation $PT Eval Low Complexity: 1 Low   PT General Charges $$ ACUTE PT VISIT: 1 Visit         Darcus Eastern, PT  Acute Rehabilitation Services Office [phone] Secure Chat welcomed'  Marcial Setting 03/16/2024, 4:32 PM

## 2024-03-16 NOTE — ED Notes (Signed)
 Pt given some apple juice.  GCS 15.

## 2024-03-16 NOTE — Progress Notes (Signed)
 Patient admitted earlier this morning for hypoglycemia and hyperkalemia.  Patient seen and examined at bedside and plan of care discussed with him.  I have reviewed patient's medical records including this morning's H&P, current vitals, labs and medications myself.  Continue CBG monitoring.  Encourage oral intake.  Might need to start dextrose  drip if continues to have hypoglycemia.  Diabetes coordinator consult.

## 2024-03-16 NOTE — Progress Notes (Signed)
 Patient arrive to 6N RM 19.Patient transferred from strecher to bed.Alert and oriented x4.CBG reading at 47. Hypoglycemic protocol implemented.CBG rechecked, 112.MD made aware.

## 2024-03-16 NOTE — Care Management Obs Status (Signed)
 MEDICARE OBSERVATION STATUS NOTIFICATION   Patient Details  Name: Jake Gross MRN: [id number] Date of Birth: [birth year]   Medicare Observation Status Notification Given:  Yes    Jannine Meo, RN 03/16/2024, 8:11 AM

## 2024-03-16 NOTE — H&P (Addendum)
 History and Physical  Haskel Dewalt Hamid AVW:[id number] DOB: 04/24/1941 DOA: 03/15/2024  Referring physician: Dr. Wallis Gun, EDP. PCP: Dorothe Gaster, NP  Outpatient Specialists: Cardiology, podiatry, orthopedic surgery. Patient coming from: Home.  Chief Complaint: Hypoglycemia.  HPI: Jake Gross is a 83 y.o. male with medical history significant for CKD 4, essential hypertension, hyperlipidemia, insulin -dependent type 2 diabetes, chronic HFpEF, coronary artery disease status post CABG in 2020, persistent atrial fibrillation on Eliquis , who presents to the ER from home via EMS due to recurrent hypoglycemia with his home meter reading blood sugars in the 50s.  No reported chest pain, shortness of breath, nausea, vomiting, subjective fevers, or chills.  He received treatment for his hypoglycemia via EMS and upon arrival to the ER his CBG was 97.  In the ER, the patient had recurrent hypoglycemia with lowest CBG of 52.  Additionally, his lab studies were notable for hyperkalemia with serum potassium of 6.6 which was treated and improved to 5.5.    Due to recurrent hypoglycemia and hyperkalemia, EDP requested admission.  Admitted by [facility], hospitalist service.  Diabetes coordinator consulted for patient's education and adjustment of home insulin  coverage.  At the time of this visit, the patient is alert and oriented x 3.  Denies having any abdominal pain.  He has no new symptoms.  ED Course: Temperature 98.  BP 153/62, pulse 61, respiration rate 13, O2 saturation 99% on room air.  Lab studies notable for serum potassium 6.6, serum bicarb 16, BUN 43, creatinine 2.77 with GFR of 22, close to baseline.  Hemoglobin 9.1.  WBC 10.0, platelet count 245.  Review of Systems: Review of systems as noted in the HPI. All other systems reviewed and are negative.   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   CAD (coronary artery disease) 09/29/2019   Cervical spondylosis    Chronic anticoagulation    Chronic diastolic CHF (congestive heart failure) (HCC) 12/17/2018   CKD (chronic kidney disease) 12/17/2018   Diabetes mellitus    History of GI bleed 09/29/2019   Hyperlipidemia    Hypertension    Iron deficiency anemia due to chronic blood loss    Paroxysmal atrial fibrillation (HCC) 12/17/2018   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 NO ISCHEMIA   COLONOSCOPY W/ POLYPECTOMY     5 polyps   COLONOSCOPY WITH PROPOFOL  N/A 08/29/2019   Procedure: COLONOSCOPY WITH PROPOFOL ;  Surgeon: Albertina Hugger, MD;  Location: [facility] ENDOSCOPY;  Service: Gastroenterology;  Laterality: N/A;   CORONARY ARTERY BYPASS GRAFT N/A 11/26/2018   Procedure: CORONARY ARTERY BYPASS GRAFTING (CABG), ON PUMP, TIMES FOUR, USING LEFT INTERNAL MAMMARY ARTERY AND ENDOSCOPICALLY HARVESTED LEFT SAPHENOUS VEIN;  Surgeon: Bartley Lightning, MD;  Location: [facility] OR;  Service: Open Heart Surgery;  Laterality: N/A;   ESOPHAGOGASTRODUODENOSCOPY (EGD) WITH PROPOFOL  N/A 08/29/2019   Procedure: ESOPHAGOGASTRODUODENOSCOPY (EGD) WITH PROPOFOL ;  Surgeon: Albertina Hugger, MD;  Location: [facility] ENDOSCOPY;  Service: Gastroenterology;  Laterality: N/A;   EUS N/A 03/11/2015   Procedure:  LOWER ENDOSCOPIC ULTRASOUND (EUS);  Surgeon: Janel Medford, MD;  Location: [facility] ENDOSCOPY;  Service: Endoscopy;  Laterality: N/A;   FLEXIBLE SIGMOIDOSCOPY N/A 02/02/2015   Procedure: FLEXIBLE SIGMOIDOSCOPY;  Surgeon: Claudette Cue, MD;  Location: [facility] ENDOSCOPY;  Service: Endoscopy;  Laterality: N/A;  ERBE   HEMOSTASIS CLIP  PLACEMENT  08/29/2019   Procedure: HEMOSTASIS CLIP PLACEMENT;  Surgeon: Albertina Hugger, MD;  Location: [facility] ENDOSCOPY;  Service: Gastroenterology;;   HOT HEMOSTASIS N/A 08/29/2019   Procedure: HOT HEMOSTASIS (ARGON PLASMA COAGULATION/BICAP);  Surgeon: Albertina Hugger, MD;  Location: [facility] ENDOSCOPY;  Service: Gastroenterology;  Laterality: N/A;   LEFT HEART CATH AND CORONARY ANGIOGRAPHY N/A 11/20/2018   Procedure: LEFT HEART CATH AND CORONARY ANGIOGRAPHY;  Surgeon: Odie Benne, MD;  Location: [facility] INVASIVE CV LAB;  Service: Cardiovascular;  Laterality: N/A;   LOOP RECORDER INSERTION N/A 08/14/2017   Procedure: LOOP RECORDER INSERTION;  Surgeon: Jolly Needle, MD;  Location: [facility] INVASIVE CV LAB;  Service: Cardiovascular;  Laterality: N/A;   PARTIAL PROCTECTOMY BY TEM N/A 04/08/2015   Procedure: TEM PARTIAL PROCTECTOMY OF RECTAL MASS;  Surgeon: Candyce Champagne, MD;  Location: [facility] ORS;  Service: General;  Laterality: N/A;   POLYPECTOMY  08/29/2019   Procedure: POLYPECTOMY;  Surgeon: Albertina Hugger, MD;  Location: [facility] ENDOSCOPY;  Service: Gastroenterology;;   TEE WITHOUT CARDIOVERSION N/A 06/25/2017   Procedure: TRANSESOPHAGEAL ECHOCARDIOGRAM (TEE);  Surgeon: Maudine Sos, MD;  Location: [facility] ENDOSCOPY;  Service: Cardiovascular;  Laterality: N/A;   TEE WITHOUT CARDIOVERSION N/A 11/26/2018   Procedure: TRANSESOPHAGEAL ECHOCARDIOGRAM (TEE);  Surgeon: Bartley Lightning, MD;  Location: [facility] OR;  Service: Open Heart Surgery;  Laterality: N/A;   TONSILLECTOMY AND ADENOIDECTOMY     as child    Social History:  reports that he quit smoking about 29 years ago. His smoking use included cigarettes. He has never used smokeless tobacco. He reports that he does not drink alcohol and does not use drugs.   No Known Allergies  Family History  Problem Relation Age of Onset   Cancer Father        "all over"   Coronary artery disease Brother    Diabetes Brother    Stroke Mother    Diabetes Mother     Cancer Brother    Colon cancer Neg Hx    Esophageal cancer Neg Hx    Rectal cancer Neg Hx    Stomach cancer Neg Hx       Prior to Admission medications   Medication Sig Start Date End Date Taking? Authorizing Provider  ACCU-CHEK GUIDE test strip 1 EACH BY OTHER ROUTE 3 (THREE) TIMES DAILY AS NEEDED FOR OTHER. USE AS INSTRUCTED 04/23/23   Dorothe Gaster, NP  acetaminophen  (TYLENOL ) 325 MG tablet Take 2 tablets (650 mg total) by mouth every 6 (six) hours as needed for mild pain (or Fever >/= 101). 02/05/23   Elgergawy, Ardia Kraft, MD  amLODipine  (NORVASC ) 10 MG tablet Take 10 mg by mouth daily.    [provider]  bacitracin  ointment Apply 1 Application topically 2 (two) times daily. 07/22/23   Ninetta Basket, MD  chlorthalidone (HYGROTON) 25 MG tablet Take 12.5 mg by mouth daily. 08/21/23   [provider]  Continuous Glucose Receiver (FREESTYLE LIBRE 2 READER) DEVI Use to check blood sugars. 03/30/23   Dorothe Gaster, NP  Continuous Glucose Sensor (FREESTYLE LIBRE 2 SENSOR) MISC Apply every 14 days to check blood sugars. 03/30/23   Cable,  [name], NP  dapagliflozin propanediol (FARXIGA) 10 MG TABS tablet Take 10 mg by mouth daily.    [provider]  ELIQUIS  2.5 MG TABS tablet TAKE 1 TABLET BY MOUTH TWICE A DAY 02/20/24   Odie Benne, MD  hydrALAZINE  (APRESOLINE ) 100 MG tablet TAKE 1 TABLET BY MOUTH 3 TIMES DAILY. 10/15/23   Marlyse Single T, PA-C  insulin  glargine (LANTUS  SOLOSTAR) 100 UNIT/ML Solostar Pen Inject 25 Units into the skin daily. 01/25/24   Dorothe Gaster, NP  Insulin  Pen Needle (BD PEN NEEDLE NANO 2ND GEN) 32G X 4 MM MISC Use with insulin  pen as directed 01/19/22   Dorothe Gaster, NP  isosorbide  mononitrate (IMDUR ) 60 MG 24 hr tablet Take 1.5 tablets (90 mg total) by mouth daily. 07/11/23 10/09/23  Marlyse Single T, PA-C  Lancets [facility] ULTRASOFT) lancets Use as instructed 04/27/21   Gwyndolyn Lerner, MD  olmesartan (BENICAR) 5 MG tablet Take 5 mg by  mouth daily. 11/01/23   [provider]  predniSONE  (STERAPRED UNI-PAK 21 TAB) 10 MG (21) TBPK tablet Take as directed Patient not taking: Reported on 02/21/2024 10/30/23   Wes Hamman, MD  rosuvastatin  (CRESTOR ) 20 MG tablet TAKE 1 TABLET BY MOUTH AT BEDTIME. SCHEDULE PHYSICAL EXAM 10/04/23   Dorothe Gaster, NP  torsemide  (DEMADEX ) 20 MG tablet TAKE 1 TABLET BY MOUTH TWICE A DAY ON MONDAYS AND THURSDAYS ONLY, TAKE 1 TABLET BY MOUTH DAILY ONLY ALL OTHER DAYS 04/25/23   Gabino Joe, PA-C    Physical Exam: BP 122/87   Pulse (!) 56   Temp 98 F (36.7 C)   Resp 20   Ht 5\' 7"  (1.702 m)   Wt 85 kg   SpO2 100%   BMI 29.35 kg/m   General: 83 y.o. year-old male well developed well nourished in no acute distress.  Alert and oriented x3. Cardiovascular: Regular rate and rhythm with no rubs or gallops.  No thyromegaly or JVD noted.  Trace edema in lower extremity bilaterally. Respiratory: Clear to auscultation with no wheezes or rales. Good inspiratory effort. Abdomen: Soft nontender nondistended with normal bowel sounds x4 quadrants. Muskuloskeletal: No cyanosis or clubbing noted bilaterally Neuro: CN II-XII intact, strength, sensation, reflexes Skin: No ulcerative lesions noted or rashes Psychiatry: Judgement and insight appear normal. Mood is appropriate for condition and setting          Labs on Admission:  Basic Metabolic Panel: Recent Labs  Lab 03/15/24 1911 03/15/24 2013 03/15/24 2322  NA 140  --  140  K 6.6* 6.1* 5.5*  CL 116*  --  116*  CO2 16*  --  17*  GLUCOSE 80  --  116*  BUN 43*  --  42*  CREATININE 2.77*  --  2.74*  CALCIUM  8.4*  --  8.6*  MG 2.4  --   --    Liver Function Tests: Recent Labs  Lab 03/15/24 1911  AST 21  ALT 11  ALKPHOS 92  BILITOT 0.7  PROT 6.4*  ALBUMIN  3.0*   No results for input(s): "LIPASE", "AMYLASE" in the last 168 hours. No results for input(s): "AMMONIA" in the last 168 hours. CBC: Recent Labs  Lab 03/15/24 1911  WBC  10.0  NEUTROABS 7.1  HGB 9.1*  HCT 29.6*  MCV 92.5  PLT 245   Cardiac Enzymes: No results for input(s): "CKTOTAL", "CKMB", "CKMBINDEX", "TROPONINI" in the last 168 hours.  BNP (last 3 results) No results for input(s): "BNP" in the last  8760 hours.  ProBNP (last 3 results) No results for input(s): "PROBNP" in the last 8760 hours.  CBG: Recent Labs  Lab 03/15/24 1857 03/15/24 2222 03/15/24 2326 03/16/24 0141  GLUCAP 97 52* 135* 64*    Radiological Exams on Admission: DG Chest Portable 1 View Result Date: 03/15/2024 CLINICAL DATA:  Shortness of breath. EXAM: PORTABLE CHEST 1 VIEW COMPARISON:  Chest x-ray 02/01/2023. FINDINGS: The heart is enlarged. There is central pulmonary vascular congestion. There is no focal lung infiltrate, pleural effusion or pneumothorax. Patient is status post cardiac surgery. Loop recorder device overlies the left chest. There is an acute appearing mid left clavicular fracture with 1 shaft width inferior displacement of the distal fracture fragment. IMPRESSION: 1. Cardiomegaly with central pulmonary vascular congestion. 2. Acute appearing mid left clavicular fracture. Please correlate clinically. Electronically Signed   By: Tyron Gallon M.D.   On: 03/15/2024 19:32    EKG: I independently viewed the EKG done and my findings are as followed: Sinus rhythm rate of 78.  Nonspecific ST-T changes.  374.  Assessment/Plan Present on Admission:  Hypoglycemia  Principal Problem:   Hypoglycemia  Recurrent hypoglycemia, suspect iatrogenic Insulin -dependent type 2 diabetes Possibly from taking too much insulin  Last hemoglobin A1c 5.5 on 01/25/2024 from 6.9 on 04/27/2022. Hold off home insulin  for now. Obtain hemoglobin A1c. Liberalize diet for now. CBG every 2 hours x 4 or until CBG has normalized. Diabetes coordinator consulted to assist with patient's education.  Hyperkalemia in the setting of advanced chronic kidney disease Presented with serum potassium  of 6.6 Improved after treatment in the ER Received calcium  gluconate, IV Lasix , albuterol  nebulizer, Lokelma 10 g x 1 Continue Lokelma 10 g x 2 doses.  CKD 4 Renal function appears to be at baseline Avoid nephrotoxic agents and hypotension. Monitor urine output. Repeat BMP in the morning.  Non anion gap metabolic acidosis in the setting of CKD 4 Serum bicarb 16, anion gap of 8 Repeat BMP.  Permanent atrial fibrillation on Eliquis  Resume home Eliquis . Currently rate controlled with heart rates in the high 40s to 50s Monitor on telemetry.  Anemia of chronic disease secondary to advanced CKD Hemoglobin 9.1 on presentation No reported overt bleeding Monitor for now  Chronic HFpEF Last 2D echo done on 02/04/2023 revealed LVEF 60-65% Euvolemic on exam Monitor strict I's and O's and daily weight Resume home regimen once home meds have been reconciled.  Hyperlipidemia Resume home Crestor   Hypertension BP is not at goal, elevated Resume home oral antihypertensives Closely monitor vital signs As needed IV antihypertensives  Generalized weakness PT OT assessment Fall precautions.   Time: 75 minutes.   DVT prophylaxis: Home Eliquis .  Code Status: Full code.  Family Communication: None at bedside.  Disposition Plan: Admitted to telemetry medical unit.  Consults called: None.  Admission status: Observation status.   Status is: Observation    Bary Boss MD [facility] Pager [phone]  If 7PM-7AM, please contact night-coverage [email or web address] Password [email or web address]  03/16/2024, 2:22 AM

## 2024-03-17 DIAGNOSIS — E162 Hypoglycemia, unspecified: Secondary | ICD-10-CM | POA: Diagnosis not present

## 2024-03-17 LAB — CBC WITH DIFFERENTIAL/PLATELET
Abs Immature Granulocytes: 0.03 10*3/uL (ref 0.00–0.07)
Basophils Absolute: 0.1 10*3/uL (ref 0.0–0.1)
Basophils Relative: 1 %
Eosinophils Absolute: 0.5 10*3/uL (ref 0.0–0.5)
Eosinophils Relative: 4 %
HCT: 33.2 % — ABNORMAL LOW (ref 39.0–52.0)
Hemoglobin: 10.3 g/dL — ABNORMAL LOW (ref 13.0–17.0)
Immature Granulocytes: 0 %
Lymphocytes Relative: 19 %
Lymphs Abs: 2 10*3/uL (ref 0.7–4.0)
MCH: 28.1 pg (ref 26.0–34.0)
MCHC: 31 g/dL (ref 30.0–36.0)
MCV: 90.5 fL (ref 80.0–100.0)
Monocytes Absolute: 1.1 10*3/uL — ABNORMAL HIGH (ref 0.1–1.0)
Monocytes Relative: 10 %
Neutro Abs: 6.9 10*3/uL (ref 1.7–7.7)
Neutrophils Relative %: 66 %
Platelets: 264 10*3/uL (ref 150–400)
RBC: 3.67 MIL/uL — ABNORMAL LOW (ref 4.22–5.81)
RDW: 14 % (ref 11.5–15.5)
WBC: 10.7 10*3/uL — ABNORMAL HIGH (ref 4.0–10.5)
nRBC: 0 % (ref 0.0–0.2)

## 2024-03-17 LAB — COMPREHENSIVE METABOLIC PANEL WITH GFR
ALT: 11 U/L (ref 0–44)
AST: 22 U/L (ref 15–41)
Albumin: 2.9 g/dL — ABNORMAL LOW (ref 3.5–5.0)
Alkaline Phosphatase: 107 U/L (ref 38–126)
Anion gap: 10 (ref 5–15)
BUN: 38 mg/dL — ABNORMAL HIGH (ref 8–23)
CO2: 17 mmol/L — ABNORMAL LOW (ref 22–32)
Calcium: 8.3 mg/dL — ABNORMAL LOW (ref 8.9–10.3)
Chloride: 108 mmol/L (ref 98–111)
Creatinine, Ser: 2.53 mg/dL — ABNORMAL HIGH (ref 0.61–1.24)
GFR, Estimated: 25 mL/min — ABNORMAL LOW (ref >60)
Glucose, Bld: 83 mg/dL (ref 70–99)
Potassium: 5 mmol/L (ref 3.5–5.1)
Sodium: 135 mmol/L (ref 135–145)
Total Bilirubin: 1.1 mg/dL (ref 0.0–1.2)
Total Protein: 6.5 g/dL (ref 6.5–8.1)

## 2024-03-17 LAB — GLUCOSE, CAPILLARY
Glucose-Capillary: 88 mg/dL (ref 70–99)
Glucose-Capillary: 81 mg/dL (ref 70–99)
Glucose-Capillary: 137 mg/dL — ABNORMAL HIGH (ref 70–99)
Glucose-Capillary: 113 mg/dL — ABNORMAL HIGH (ref 70–99)
Glucose-Capillary: 150 mg/dL — ABNORMAL HIGH (ref 70–99)

## 2024-03-17 LAB — MAGNESIUM: Magnesium: 2.3 mg/dL (ref 1.7–2.4)

## 2024-03-17 MED ORDER — SODIUM BICARBONATE 8.4 % IV SOLN
INTRAVENOUS | Status: DC
  Filled 2024-03-17: qty 150
  Filled 2024-03-17: qty 1000

## 2024-03-17 NOTE — Plan of Care (Signed)
  Problem: Education: Goal: Knowledge of General Education information will improve Description: Including pain rating scale, medication(s)/side effects and non-pharmacologic comfort measures Outcome: Progressing   Problem: Health Behavior/Discharge Planning: Goal: Ability to manage health-related needs will improve Outcome: Progressing   Problem: Clinical Measurements: Goal: Ability to maintain clinical measurements within normal limits will improve Outcome: Progressing Goal: Diagnostic test results will improve Outcome: Progressing   Problem: Activity: Goal: Risk for activity intolerance will decrease Outcome: Progressing   

## 2024-03-17 NOTE — Evaluation (Signed)
 Occupational Therapy Evaluation Patient Details Name: Jake Gross MRN: [id number] DOB: 04-07-1941 Today's Date: 03/17/2024   History of Present Illness   Jake Gross is a 83 y.o. male who presents to the ER from home via EMS due to recurrent hypoglycemia with his home meter reading blood sugars in the 50s; with medical history significant for CKD 4, essential hypertension, hyperlipidemia, insulin -dependent type 2 diabetes, chronic HFpEF, coronary artery disease status post CABG in 2020, persistent atrial fibrillation on Eliquis      Clinical Impressions PTA, pt lived alone and was independent in ADL and IADL per his report. Pt reports he had an aide or RN 3 weeks ago who assisted with cleaning/cooking/some self care/medication management, and would like to get her back if possible. Upon eval, pt supervision for STS and up to CGA for standing ADL. Pt with decreased education regarding fall prevention strategies and benefits from use of RW during mobility. Will continue to follow and recommending HHOT at discharge, as well as HH aide.      If plan is discharge home, recommend the following:   A little help with walking and/or transfers;A little help with bathing/dressing/bathroom;Assistance with cooking/housework;Assist for transportation;Help with stairs or ramp for entrance     Functional Status Assessment   Patient has had a recent decline in their functional status and demonstrates the ability to make significant improvements in function in a reasonable and predictable amount of time.     Equipment Recommendations   BSC/3in1     Recommendations for Other Services   Speech consult (cog)     Precautions/Restrictions   Precautions Precautions: Fall Recall of Precautions/Restrictions: Intact     Mobility Bed Mobility Overal bed mobility: Needs Assistance Bed Mobility: Supine to Sit     Supine to sit: Supervision          Transfers Overall transfer  level: Needs assistance Equipment used: None Transfers: Sit to/from Stand Sit to Stand: Supervision           General transfer comment: up from EOB      Balance Overall balance assessment: Needs assistance   Sitting balance-Leahy Scale: Fair       Standing balance-Leahy Scale: Fair Standing balance comment: statically                           ADL either performed or assessed with clinical judgement   ADL Overall ADL's : Needs assistance/impaired Eating/Feeding: Independent   Grooming: Supervision/safety;Standing;Wash/dry face Grooming Details (indicate cue type and reason): shaving Upper Body Bathing: Modified independent;Sitting   Lower Body Bathing: Sit to/from stand;Supervison/ safety   Upper Body Dressing : Sitting;Modified independent   Lower Body Dressing: Contact guard assist;Sit to/from stand   Toilet Transfer: Contact guard assist;Ambulation;Rolling walker (2 wheels)           Functional mobility during ADLs: Contact guard assist;Rolling walker (2 wheels)       Vision         Perception         Praxis         Pertinent Vitals/Pain Pain Assessment Pain Assessment: Faces Faces Pain Scale: Hurts a little bit Pain Location: R UE Pain Descriptors / Indicators: Sore Pain Intervention(s): Limited activity within patient's tolerance     Extremity/Trunk Assessment Upper Extremity Assessment Upper Extremity Assessment: Overall WFL for tasks assessed;Right hand dominant;RUE deficits/detail RUE Deficits / Details: Painful, but can still use RUE with basic ADLs   Lower  Extremity Assessment Lower Extremity Assessment: Defer to PT evaluation       Communication Communication Communication: No apparent difficulties   Cognition Arousal: Alert Behavior During Therapy: WFL for tasks assessed/performed Cognition: No family/caregiver present to determine baseline             OT - Cognition Comments: mild memory deficits, but  oriented with increased time and follows up to 2 step commands.                 Following commands: Intact       Cueing  General Comments   Cueing Techniques: Verbal cues      Exercises     Shoulder Instructions      Home Living Family/patient expects to be discharged to:: Private residence Living Arrangements: Alone Available Help at Discharge: Family;Available PRN/intermittently Type of Home: House Home Access: Stairs to enter [employer] of Steps: 5 Entrance Stairs-Rails: Right;Left Home Layout: One level     Bathroom Shower/Tub: Chief Strategy Officer: Handicapped height     Home Equipment: Cane - single Librarian, academic (2 wheels)   Additional Comments: reports he is still driving      Prior Functioning/Environment Prior Level of Function : Independent/Modified Independent             Mobility Comments: Has not been needing his RW for amb in the time since last admission ADLs Comments: Reports independence with ADLs, IADLs, still driving    OT Problem List: Decreased strength;Decreased activity tolerance;Impaired balance (sitting and/or standing);Decreased safety awareness   OT Treatment/Interventions: Self-care/ADL training;Therapeutic exercise;DME and/or AE instruction;Therapeutic activities;Patient/family education;Balance training      OT Goals(Current goals can be found in the care plan section)   Acute Rehab OT Goals Patient Stated Goal: get better OT Goal Formulation: With patient Time For Goal Achievement: 03/31/24 Potential to Achieve Goals: Good   OT Frequency:  Min 1X/week    Co-evaluation              AM-PAC OT "6 Clicks" Daily Activity     Outcome Measure Help from another person eating meals?: None Help from another person taking care of personal grooming?: A Little Help from another person toileting, which includes using toliet, bedpan, or urinal?: A Little Help from another person  bathing (including washing, rinsing, drying)?: A Little Help from another person to put on and taking off regular upper body clothing?: None Help from another person to put on and taking off regular lower body clothing?: A Little 6 Click Score: 20   End of Session Equipment Utilized During Treatment: Gait belt;Rolling walker (2 wheels) Nurse Communication: Mobility status  Activity Tolerance: Patient tolerated treatment well Patient left: in chair;with call bell/phone within reach;with chair alarm set  OT Visit Diagnosis: Unsteadiness on feet (R26.81);Muscle weakness (generalized) (M62.81)                Time: 1610-9604 OT Time Calculation (min): 28 min Charges:  OT General Charges $OT Visit: 1 Visit OT Evaluation $OT Eval Low Complexity: 1 Low OT Treatments $Self Care/Home Management : 8-22 mins  Karilyn Ouch, OTR/L [facility] Acute Rehabilitation Office: [phone]   Jake Gross 03/17/2024, 1:41 PM

## 2024-03-17 NOTE — Inpatient Diabetes Management (Addendum)
 Inpatient Diabetes Program Recommendations  AACE/ADA: New Consensus Statement on Inpatient Glycemic Control (2015)  Target Ranges:  Prepandial:   less than 140 mg/dL      Peak postprandial:   less than 180 mg/dL (1-2 hours)      Critically ill patients:  140 - 180 mg/dL    Latest Reference Range & Units 06/11/23 15:16 10/12/23 15:23 01/25/24 16:26 03/16/24 06:53  Hemoglobin A1C 4.8 - 5.6 % 4.9 5.5 Pend 5.5 Pend 4.9  93 mg/dl    Latest Reference Range & Units 03/15/24 18:57 03/15/24 22:22 03/15/24 23:26 03/16/24 01:41 03/16/24 02:32 03/16/24 04:07 03/16/24 04:09  Glucose-Capillary 70 - 99 mg/dL 97 52 (L) 161 (H) 64 (L) 79 46 (L) 47 (L)    Latest Reference Range & Units 03/16/24 05:19 03/16/24 08:03 03/16/24 08:27 03/16/24 09:02 03/16/24 10:58 03/16/24 11:11 03/16/24 13:23  Glucose-Capillary 70 - 99 mg/dL 096 (H) 54 (L) 69 (L) 96 70 72 103 (H)    Latest Reference Range & Units 03/16/24 14:49 03/16/24 16:47 03/16/24 20:15 03/17/24 02:27 03/17/24 05:35  Glucose-Capillary 70 - 99 mg/dL 045 (H) 409 (H) 811 (H) 88 81  (H): Data is abnormally high    Admit with: Hypoglycemia and Hyperkalemia  History: DM, CKD4  Home DM Meds: Lantus  25 units daily       Farxiga 10 mg daily       FSL2 CGM  Current Orders: Checking CBGs Q4H      D5% IVF 75cc/hr    Met w/ pt at bedside this AM/  Pt A&O and able to answer all my questions.  Pt told me his Freestyle Libre 3 CGM device read 30--He drank some juice and rechecked and it was 51--He then drank milk and vegetable juice and CGM reading rose to 60.  Pt stated he did not have traditional Hypoglycemia symptoms--Stated he just felt very SOB and that's why he called EMS.  I verified with pt that he has been taking the reduced dose of Lantus  25 units daily at home.  Per last PCP visit Winthrop Hawks, NP with [employer]), pt was having issues with HYPO and was told to reduce the Lantus  to 25 units daily which he did do.  We reviewed the signs and symptoms of  HYPO--which pt again stated he did not have any of these symptoms--only SOB.  Pt told me he knows how to treat lows at home--I asked pt to purchase some juice boxes for home (apple or grape) to have as extra back up treatment for HYPO--Pt agreeable.  I also explained how his worsening kidney function could be contributing to his HYPO and how the MD may need to further reduce his Lantus  for home when he goes home.  Pt stated understanding.  Pt showed me his Freestyle Libre 3 CGM on his LUA.  His reader device is at home by his couch.  I also encouraged pt to check a fingerstick CBG at home when the FSL3 CGM reads Low.  Pt agreeable.      --Will follow patient during hospitalization--  Langston Pippins RN, MSN, CDCES Diabetes Coordinator Inpatient Glycemic Control Team Team Pager: [phone] (8a-5p)

## 2024-03-17 NOTE — Progress Notes (Signed)
 PROGRESS NOTE    Jake Gross  KGM:[id number] DOB: [birth year] DOA: 03/15/2024 PCP: Dorothe Gaster, NP   Brief Narrative:  83 y.o. male with medical history significant for CKD 4, essential hypertension, hyperlipidemia, insulin -dependent type 2 diabetes, chronic HFpEF, coronary artery disease status post CABG in 2020, persistent atrial fibrillation on Eliquis  presented with recurrent hypoglycemia.  On presentation, patient had recurrent hypoglycemia with lowest CBG of 52.  Potassium was 6.6 which improved to 5.5 after treatment.  Creatinine of 2.77, bicarb of 16.  He was subsequently started on dextrose  drip.  Assessment & Plan:   Recurrent hypoglycemia, possibly from insulin  Diabetes mellitus type 2 with level 2 hypoglycemia -Lowest CBG of 46 during this hospitalization.  Currently on D5 drip with slightly improved blood sugars.  IV fluid plan as below - Insulin  on hold.  Diabetes coordinator consult pending.  Diet has been liberalized - A1c was 5.5 on 01/25/2024  Hyperkalemia - Potassium 6.6 on presentation.  Resolved with treatment.  Potassium 5 today.  Monitor  Acute metabolic acidosis -Remains acidotic despite being on oral sodium bicarbonate .  Bicarbonate 17 today.  Start bicarb drip  CKD stage IV - Creatinine at baseline.  Monitor.  Outpatient follow-up with nephrology  Anemia of chronic disease -from kidney disease.  Hemoglobin stable.  Monitor intermittently.  No signs of bleeding  Permanent atrial fibrillation - Currently rate controlled - Continue Eliquis   Hypertension Hyperlipidemia - Blood pressure currently stable.  Monitor.  Continue hydralazine .  Continue statin  Chronic diastolic heart failure - Last echo on 02/04/2023 had shown EF of 60 to 65%.  Currently euvolemic.  Continue hydralazine .  Strict input and output.  Daily weights.  Generalized deconditioning - PT recommends home health PT   DVT prophylaxis: Eliquis  Code Status: Full Family  Communication: None at bedside Disposition Plan: Status is: Inpatient Remains inpatient appropriate because: Of severity of illness    Consultants: None  Procedures: None  Antimicrobials: None   Subjective: Patient seen and examined at bedside.  Still feels weak.  No fever, vomiting, abdominal pain reported.  Objective: Vitals:   03/16/24 2017 03/17/24 0303 03/17/24 0500 03/17/24 0918  BP: (!) 147/66 (!) 143/64  (!) 138/57  Pulse: 65 60  64  Resp: 18 18  17   Temp: 98.5 F (36.9 C) 97.8 F (36.6 C)  98.1 F (36.7 C)  TempSrc: Oral Oral    SpO2: 99% 99%  95%  Weight:   85.7 kg   Height:        Intake/Output Summary (Last 24 hours) at 03/17/2024 0949 Last data filed at 03/17/2024 0900 Gross per 24 hour  Intake 2126.22 ml  Output 700 ml  Net 1426.22 ml   Filed Weights   03/15/24 1851 03/16/24 0830 03/17/24 0500  Weight: 85 kg 85.6 kg 85.7 kg    Examination:  General exam: Appears calm and comfortable.  Looks ill and deconditioned. Respiratory system: Bilateral decreased breath sounds at bases, no wheezing Cardiovascular system: S1 & S2 heard, Rate controlled Gastrointestinal system: Abdomen is nondistended, soft and nontender. Normal bowel sounds heard. Extremities: No cyanosis, clubbing; trace lower extremity edema Central nervous system: Alert and oriented.  Slow to respond.  Poor historian.  No focal neurological deficits. Moving extremities Skin: No rashes, lesions or ulcers Psychiatry: Flat affect.  Not agitated   Data Reviewed: I have personally reviewed following labs and imaging studies  CBC: Recent Labs  Lab 03/15/24 1911 03/16/24 0653 03/17/24 0707  WBC 10.0 8.6 10.7*  NEUTROABS  7.1  --  6.9  HGB 9.1* 9.1* 10.3*  HCT 29.6* 29.7* 33.2*  MCV 92.5 93.4 90.5  PLT 245 211 264   Basic Metabolic Panel: Recent Labs  Lab 03/15/24 1911 03/15/24 2013 03/15/24 2322 03/16/24 0653 03/17/24 0707  NA 140  --  140 139 135  K 6.6* 6.1* 5.5* 5.4* 5.0   CL 116*  --  116* 114* 108  CO2 16*  --  17* 16* 17*  GLUCOSE 80  --  116* 62* 83  BUN 43*  --  42* 40* 38*  CREATININE 2.77*  --  2.74* 2.56* 2.53*  CALCIUM  8.4*  --  8.6* 8.6* 8.3*  MG 2.4  --   --   --  2.3  PHOS  --   --   --  5.1*  --    GFR: Estimated Creatinine Clearance: 23.9 mL/min (A) (by C-G formula based on SCr of 2.53 mg/dL (H)). Liver Function Tests: Recent Labs  Lab 03/15/24 1911 03/17/24 0707  AST 21 22  ALT 11 11  ALKPHOS 92 107  BILITOT 0.7 1.1  PROT 6.4* 6.5  ALBUMIN  3.0* 2.9*   No results for input(s): "LIPASE", "AMYLASE" in the last 168 hours. No results for input(s): "AMMONIA" in the last 168 hours. Coagulation Profile: No results for input(s): "INR", "PROTIME" in the last 168 hours. Cardiac Enzymes: No results for input(s): "CKTOTAL", "CKMB", "CKMBINDEX", "TROPONINI" in the last 168 hours. BNP (last 3 results) No results for input(s): "PROBNP" in the last 8760 hours. HbA1C: Recent Labs    03/16/24 0653  HGBA1C 4.9   CBG: Recent Labs  Lab 03/16/24 1647 03/16/24 2015 03/17/24 0227 03/17/24 0535 03/17/24 0921  GLUCAP 131* 129* 88 81 137*   Lipid Profile: No results for input(s): "CHOL", "HDL", "LDLCALC", "TRIG", "CHOLHDL", "LDLDIRECT" in the last 72 hours. Thyroid  Function Tests: Recent Labs    03/15/24 1911  TSH 2.454  FREET4 0.86   Anemia Panel: No results for input(s): "VITAMINB12", "FOLATE", "FERRITIN", "TIBC", "IRON", "RETICCTPCT" in the last 72 hours. Sepsis Labs: No results for input(s): "PROCALCITON", "LATICACIDVEN" in the last 168 hours.  No results found for this or any previous visit (from the past 240 hours).       Radiology Studies: DG Chest Portable 1 View Result Date: 03/15/2024 CLINICAL DATA:  Shortness of breath. EXAM: PORTABLE CHEST 1 VIEW COMPARISON:  Chest x-ray 02/01/2023. FINDINGS: The heart is enlarged. There is central pulmonary vascular congestion. There is no focal lung infiltrate, pleural effusion or  pneumothorax. Patient is status post cardiac surgery. Loop recorder device overlies the left chest. There is an acute appearing mid left clavicular fracture with 1 shaft width inferior displacement of the distal fracture fragment. IMPRESSION: 1. Cardiomegaly with central pulmonary vascular congestion. 2. Acute appearing mid left clavicular fracture. Please correlate clinically. Electronically Signed   By: Tyron Gallon M.D.   On: 03/15/2024 19:32        Scheduled Meds:  apixaban   2.5 mg Oral BID   hydrALAZINE   10 mg Oral Q8H   rosuvastatin   20 mg Oral Daily   sodium bicarbonate   650 mg Oral TID   Continuous Infusions:        Audria Leather, MD [facility] 03/17/2024, 9:49 AM

## 2024-03-17 NOTE — TOC Initial Note (Signed)
 Transition of Care (TOC) - Initial/Assessment Note   Patient from home alone. Recently had [facility] and would services again through them. Antonette Batters with [facility] accepted referral for HHRN,PT,OT.   Patient has a rolling walker at home, needs 3 in 1 . Ordered with ZAchary with [facility].  Secure chatted D to sign orders face to face and DME note  Patient Details  Name: Jake Gross MRN: [id number] Date of Birth: 1941-01-04  Transition of Care [facility]) CM/SW Contact:    Terre Ferri, RN Phone Number: 03/17/2024, 1:48 PM  Clinical Narrative:                   Expected Discharge Plan: Home w Home Health Services Barriers to Discharge: Continued Medical Work up   Patient Goals and CMS Choice Patient states their goals for this hospitalization and ongoing recovery are:: to return to home CMS Medicare.gov Compare Post Acute Care list provided to:: Patient Choice offered to / list presented to : Patient      Expected Discharge Plan and Services   Discharge Planning Services: CM Consult Post Acute Care Choice: Home Health, Durable Medical Equipment Living arrangements for the past 2 months: Single Family Home                 DME Arranged: 3-N-1 DME Agency: [facility] Date DME Agency Contacted: 03/17/24 Time DME Agency Contacted: [phone] Representative spoke with at DME Agency: Raechel Bulla HH Arranged: PT, RN, OT [facility] Agency: [facility] ([facility]) Date HH Agency Contacted: 03/17/24 Time HH Agency Contacted: 1347 Representative spoke with at [facility] Agency: Antonette Batters  Prior Living Arrangements/Services Living arrangements for the past 2 months: Single Family Home Lives with:: Self Patient language and need for interpreter reviewed:: Yes Do you feel safe going back to the place where you live?: Yes      Need for Family Participation in Patient Care: Yes (Comment) Care giver support system in place?: Yes (comment) Current home services:  DME Criminal Activity/Legal Involvement Pertinent to Current Situation/Hospitalization: No - Comment as needed  Activities of Daily Living   ADL Screening (condition at time of admission) Independently performs ADLs?: Yes (appropriate for developmental age) Is the patient deaf or have difficulty hearing?: No Does the patient have difficulty seeing, even when wearing glasses/contacts?: No Does the patient have difficulty concentrating, remembering, or making decisions?: No  Permission Sought/Granted   Permission granted to share information with : Yes, Verbal Permission Granted     Permission granted to share info w AGENCY: [facility], [facility]        Emotional Assessment Appearance:: Appears stated age Attitude/Demeanor/Rapport: Engaged Affect (typically observed): Appropriate Orientation: : Oriented to Self, Oriented to Place, Oriented to  Time, Oriented to Situation Alcohol / Substance Use: Not Applicable Psych Involvement: No (comment)  Admission diagnosis:  Hyperkalemia [E87.5] Hypoglycemia [E16.2] Patient Active Problem List   Diagnosis Date Noted   Hypoglycemia 03/16/2024   Right hip pain 10/12/2023   Enlarged and hypertrophic nails 07/13/2023   Hospital discharge follow-up 03/05/2023   Acute-on-chronic kidney injury (HCC) 02/01/2023   AKI (acute kidney injury) (HCC) 01/25/2023   Acute renal failure 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 (HCC) 09/28/2022   Elevated troponin 09/28/2022   Lactic acidosis 09/28/2022   Prolonged QT interval 09/28/2022   Medicare annual wellness visit, subsequent 10/29/2021   Abnormal SPEP 09/12/2021   Dyspnea 06/27/2021   Abscess 06/27/2021   History of anemia 06/27/2021   Nocturia 06/27/2021  Benign hypertensive kidney disease with chronic kidney disease 12/22/2019   Proteinuria 12/22/2019   Stage 3b chronic kidney disease (HCC) 12/22/2019   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    Hypokalemia 12/22/2019   History of GI bleed 09/29/2019   CAD (coronary artery disease) 09/29/2019   AVM (arteriovenous malformation) of small bowel, 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   Iron deficiency anemia due to chronic blood loss    Chronic anticoagulation    History of CVA (cerebrovascular accident) 08/25/2019   (HFpEF) heart failure with preserved ejection fraction (HCC) 12/17/2018   Paroxysmal atrial fibrillation (HCC) 12/17/2018   CKD (chronic kidney disease) 12/17/2018   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   Unstable angina (HCC)    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 [place] [address] Phone: [phone] Fax: [phone]     Social Drivers of Health (SDOH) Social History: SDOH Screenings   Food Insecurity: No Food Insecurity (03/16/2024)  Housing: Low Risk  (03/16/2024)  Transportation Needs: No Transportation Needs (03/16/2024)  Utilities: Not At Risk (03/16/2024)  Alcohol Screen: Low Risk  (12/11/2023)  Depression (PHQ2-9): Low Risk  (02/21/2024)  Financial Resource Strain: Low Risk  (12/11/2023)  Physical Activity: Insufficiently Active (12/11/2023)  Social Connections: Socially Isolated (03/16/2024)  Stress: No Stress Concern Present (12/11/2023)  Tobacco Use: Medium Risk (03/16/2024)  Health Literacy: Adequate Health Literacy (12/11/2023)   SDOH Interventions:     Readmission Risk Interventions    02/05/2023   11:08 AM  Readmission Risk Prevention Plan  Transportation Screening Complete  Medication Review (RN Care Manager) Complete  PCP or Specialist appointment within 3-5 days of discharge Complete  HRI or  Home Care Consult Complete  SW Recovery Care/Counseling Consult Complete  Palliative Care Screening Not Applicable  Skilled Nursing Facility Complete

## 2024-03-17 NOTE — TOC CM/SW Note (Signed)
    Durable Medical Equipment  (From admission, onward)           Start     Ordered   03/17/24 1346  For home use only DME 3 n 1  Once        03/17/24 1345             Patient confined to a room with no bathroom therefore needs a 3 in 1 , bedside commode

## 2024-03-18 DIAGNOSIS — E162 Hypoglycemia, unspecified: Secondary | ICD-10-CM | POA: Diagnosis not present

## 2024-03-18 LAB — BASIC METABOLIC PANEL WITH GFR
Anion gap: 9 (ref 5–15)
BUN: 41 mg/dL — ABNORMAL HIGH (ref 8–23)
CO2: 20 mmol/L — ABNORMAL LOW (ref 22–32)
Calcium: 7.9 mg/dL — ABNORMAL LOW (ref 8.9–10.3)
Chloride: 106 mmol/L (ref 98–111)
Creatinine, Ser: 2.57 mg/dL — ABNORMAL HIGH (ref 0.61–1.24)
GFR, Estimated: 24 mL/min — ABNORMAL LOW (ref >60)
Glucose, Bld: 130 mg/dL — ABNORMAL HIGH (ref 70–99)
Potassium: 4.6 mmol/L (ref 3.5–5.1)
Sodium: 135 mmol/L (ref 135–145)

## 2024-03-18 LAB — CBC WITH DIFFERENTIAL/PLATELET
Abs Immature Granulocytes: 0.04 10*3/uL (ref 0.00–0.07)
Basophils Absolute: 0.1 10*3/uL (ref 0.0–0.1)
Basophils Relative: 1 %
Eosinophils Absolute: 0.5 10*3/uL (ref 0.0–0.5)
Eosinophils Relative: 5 %
HCT: 28.5 % — ABNORMAL LOW (ref 39.0–52.0)
Hemoglobin: 9.1 g/dL — ABNORMAL LOW (ref 13.0–17.0)
Immature Granulocytes: 0 %
Lymphocytes Relative: 14 %
Lymphs Abs: 1.3 10*3/uL (ref 0.7–4.0)
MCH: 27.7 pg (ref 26.0–34.0)
MCHC: 31.9 g/dL (ref 30.0–36.0)
MCV: 86.9 fL (ref 80.0–100.0)
Monocytes Absolute: 1.1 10*3/uL — ABNORMAL HIGH (ref 0.1–1.0)
Monocytes Relative: 11 %
Neutro Abs: 6.8 10*3/uL (ref 1.7–7.7)
Neutrophils Relative %: 69 %
Platelets: 207 10*3/uL (ref 150–400)
RBC: 3.28 MIL/uL — ABNORMAL LOW (ref 4.22–5.81)
RDW: 13.6 % (ref 11.5–15.5)
WBC: 9.8 10*3/uL (ref 4.0–10.5)
nRBC: 0 % (ref 0.0–0.2)

## 2024-03-18 LAB — GLUCOSE, CAPILLARY
Glucose-Capillary: 115 mg/dL — ABNORMAL HIGH (ref 70–99)
Glucose-Capillary: 129 mg/dL — ABNORMAL HIGH (ref 70–99)
Glucose-Capillary: 132 mg/dL — ABNORMAL HIGH (ref 70–99)
Glucose-Capillary: 141 mg/dL — ABNORMAL HIGH (ref 70–99)
Glucose-Capillary: 145 mg/dL — ABNORMAL HIGH (ref 70–99)

## 2024-03-18 LAB — MAGNESIUM: Magnesium: 2.3 mg/dL (ref 1.7–2.4)

## 2024-03-18 NOTE — Plan of Care (Signed)
   Problem: Education: Goal: Knowledge of General Education information will improve Description: Including pain rating scale, medication(s)/side effects and non-pharmacologic comfort measures Outcome: Progressing   Problem: Nutrition: Goal: Adequate nutrition will be maintained Outcome: Progressing   Problem: Safety: Goal: Ability to remain free from injury will improve Outcome: Progressing

## 2024-03-18 NOTE — Progress Notes (Signed)
 PROGRESS NOTE    Jake Gross  WUJ:[id number] DOB: [birth year] DOA: 03/15/2024 PCP: Dorothe Gaster, NP   Brief Narrative:  83 y.o. male with medical history significant for CKD 4, essential hypertension, hyperlipidemia, insulin -dependent type 2 diabetes, chronic HFpEF, coronary artery disease status post CABG in 2020, persistent atrial fibrillation on Eliquis  presented with recurrent hypoglycemia.  On presentation, patient had recurrent hypoglycemia with lowest CBG of 52.  Potassium was 6.6 which improved to 5.5 after treatment.  Creatinine of 2.77, bicarb of 16.  He was started on dextrose  drip.  Subsequently, he has been switched to bicarb drip for acidosis.  Assessment & Plan:   Recurrent hypoglycemia, possibly from insulin  Diabetes mellitus type 2 with level 2 hypoglycemia -Lowest CBG of 46 during this hospitalization.  Blood sugars improving.  IV fluid plan as below - Insulin  on hold.  Diabetes coordinator following.  Diet has been liberalized - A1c was 5.5 on 01/25/2024  Hyperkalemia - Potassium 6.6 on presentation.  Resolved with treatment.  Potassium 4.6 today.  Monitor  Acute metabolic acidosis - Bicarb drip started on 03/17/2024 for persistent acidosis.  Bicarb 20 today.  DC bicarb drip.  Monitor.  CKD stage IV - Creatinine at baseline.  Monitor.  Outpatient follow-up with nephrology  Anemia of chronic disease -from kidney disease.  Hemoglobin stable.  Monitor intermittently.  No signs of bleeding  Permanent atrial fibrillation - Currently rate controlled - Continue Eliquis   Hypertension Hyperlipidemia - Blood pressure currently stable.  Monitor.  Continue hydralazine .  Continue statin  Chronic diastolic heart failure - Last echo on 02/04/2023 had shown EF of 60 to 65%.  Currently euvolemic.  Continue hydralazine .  Strict input and output.  Daily weights.  Generalized deconditioning - PT recommends home health PT   DVT prophylaxis: Eliquis  Code Status:  Full Family Communication: None at bedside Disposition Plan: Status is: Inpatient Remains inpatient appropriate because: Of severity of illness    Consultants: None  Procedures: None  Antimicrobials: None   Subjective: Patient seen and examined at bedside.  Feels slightly better but still feels weak.  Denies worsening abdominal pain, fever or vomiting.  Objective: Vitals:   03/17/24 2024 03/18/24 0444 03/18/24 0500 03/18/24 0806  BP: (!) 154/73 129/71  139/62  Pulse: (!) 50 61  (!) 58  Resp: 18 12  16   Temp: (!) 97.5 F (36.4 C) 98.5 F (36.9 C)  99.4 F (37.4 C)  TempSrc: Oral Oral  Oral  SpO2: 98% 98%  97%  Weight:   86.3 kg   Height:        Intake/Output Summary (Last 24 hours) at 03/18/2024 0954 Last data filed at 03/18/2024 0744 Gross per 24 hour  Intake 1226.88 ml  Output 800 ml  Net 426.88 ml   Filed Weights   03/16/24 0830 03/17/24 0500 03/18/24 0500  Weight: 85.6 kg 85.7 kg 86.3 kg    Examination:  General: On room air.  No distress.  Chronically ill and deconditioned looking. ENT/neck: No thyromegaly.  JVD is not elevated  respiratory: Decreased breath sounds at bases bilaterally with some crackles; no wheezing  CVS: S1-S2 heard, rate controlled currently Abdominal: Soft, nontender, slightly distended; no organomegaly,  bowel sounds are heard Extremities: Trace lower extremity edema; no cyanosis  CNS: Awake and alert.  Still slow to respond and a poor historian.  No focal neurologic deficit.  Moves extremities Lymph: No obvious lymphadenopathy Skin: No obvious ecchymosis/lesions  psych: Currently not agitated.  Mostly flat affect.  Musculoskeletal: No obvious joint swelling/deformity    Data Reviewed: I have personally reviewed following labs and imaging studies  CBC: Recent Labs  Lab 03/15/24 1911 03/16/24 0653 03/17/24 0707 03/18/24 0551  WBC 10.0 8.6 10.7* 9.8  NEUTROABS 7.1  --  6.9 6.8  HGB 9.1* 9.1* 10.3* 9.1*  HCT 29.6* 29.7*  33.2* 28.5*  MCV 92.5 93.4 90.5 86.9  PLT 245 211 264 207   Basic Metabolic Panel: Recent Labs  Lab 03/15/24 1911 03/15/24 2013 03/15/24 2322 03/16/24 0653 03/17/24 0707 03/18/24 0551  NA 140  --  140 139 135 135  K 6.6* 6.1* 5.5* 5.4* 5.0 4.6  CL 116*  --  116* 114* 108 106  CO2 16*  --  17* 16* 17* 20*  GLUCOSE 80  --  116* 62* 83 130*  BUN 43*  --  42* 40* 38* 41*  CREATININE 2.77*  --  2.74* 2.56* 2.53* 2.57*  CALCIUM  8.4*  --  8.6* 8.6* 8.3* 7.9*  MG 2.4  --   --   --  2.3 2.3  PHOS  --   --   --  5.1*  --   --    GFR: Estimated Creatinine Clearance: 23.7 mL/min (A) (by C-G formula based on SCr of 2.57 mg/dL (H)). Liver Function Tests: Recent Labs  Lab 03/15/24 1911 03/17/24 0707  AST 21 22  ALT 11 11  ALKPHOS 92 107  BILITOT 0.7 1.1  PROT 6.4* 6.5  ALBUMIN  3.0* 2.9*   No results for input(s): "LIPASE", "AMYLASE" in the last 168 hours. No results for input(s): "AMMONIA" in the last 168 hours. Coagulation Profile: No results for input(s): "INR", "PROTIME" in the last 168 hours. Cardiac Enzymes: No results for input(s): "CKTOTAL", "CKMB", "CKMBINDEX", "TROPONINI" in the last 168 hours. BNP (last 3 results) No results for input(s): "PROBNP" in the last 8760 hours. HbA1C: Recent Labs    03/16/24 0653  HGBA1C 4.9   CBG: Recent Labs  Lab 03/17/24 0921 03/17/24 1141 03/17/24 1655 03/18/24 0022 03/18/24 0803  GLUCAP 137* 113* 150* 141* 145*   Lipid Profile: No results for input(s): "CHOL", "HDL", "LDLCALC", "TRIG", "CHOLHDL", "LDLDIRECT" in the last 72 hours. Thyroid  Function Tests: Recent Labs    03/15/24 1911  TSH 2.454  FREET4 0.86   Anemia Panel: No results for input(s): "VITAMINB12", "FOLATE", "FERRITIN", "TIBC", "IRON", "RETICCTPCT" in the last 72 hours. Sepsis Labs: No results for input(s): "PROCALCITON", "LATICACIDVEN" in the last 168 hours.  No results found for this or any previous visit (from the past 240 hours).        Radiology Studies: No results found.       Scheduled Meds:  apixaban   2.5 mg Oral BID   hydrALAZINE   10 mg Oral Q8H   rosuvastatin   20 mg Oral Daily   Continuous Infusions:  sodium bicarbonate  150 mEq in dextrose  5 % 1,150 mL infusion 75 mL/hr at 03/18/24 0404          Audria Leather, MD [facility] 03/18/2024, 9:54 AM

## 2024-03-18 NOTE — Progress Notes (Signed)
 Physical Therapy Treatment Patient Details Name: Jake Gross MRN: [id number] DOB: Apr 17, 1941 Today's Date: 03/18/2024   History of Present Illness Jake Gross is a 83 y.o. male who presents to the ER from home via EMS due to recurrent hypoglycemia with his home meter reading blood sugars in the 50s; with medical history significant for CKD 4, essential hypertension, hyperlipidemia, insulin -dependent type 2 diabetes, chronic HFpEF, coronary artery disease status post CABG in 2020, persistent atrial fibrillation on Eliquis     PT Comments  Pt received in supine and agreeable to session. Pt able to complete all mobility tasks with up to CGA and requires cues for safety throughout due to slight impulsivity. Pt able to tolerate increased gait distance this session with RW support for improved stability. Pt demonstrates increased instability during turns with slight R knee buckling, but no LOB.  Pt continues to benefit from PT services to progress toward functional mobility goals.     If plan is discharge home, recommend the following: A little help with walking and/or transfers;Assistance with cooking/housework;Assist for transportation   Can travel by private vehicle        Equipment Recommendations  Rolling walker (2 wheels);BSC/3in1    Recommendations for Other Services       Precautions / Restrictions Precautions Precautions: Fall Recall of Precautions/Restrictions: Intact     Mobility  Bed Mobility Overal bed mobility: Needs Assistance Bed Mobility: Supine to Sit     Supine to sit: Supervision, HOB elevated, Used rails          Transfers Overall transfer level: Needs assistance Equipment used: None Transfers: Sit to/from Stand Sit to Stand: Supervision           General transfer comment: from low EOB with supervision for safety    Ambulation/Gait Ambulation/Gait assistance: Contact guard assist Gait Distance (Feet): 230 Feet Assistive device: Rolling  walker (2 wheels) Gait Pattern/deviations: Step-through pattern, Drifts right/left, Trunk flexed       General Gait Details: Cues for upright posture and RW proximity. Increased instability during turns with slight R knee buckling noted, but no LOB   Stairs             Wheelchair Mobility     Tilt Bed    Modified Rankin (Stroke Patients Only)       Balance Overall balance assessment: Needs assistance Sitting-balance support: Bilateral upper extremity supported, Feet supported Sitting balance-Leahy Scale: Fair     Standing balance support: Bilateral upper extremity supported, During functional activity Standing balance-Leahy Scale: Fair Standing balance comment: with RW support                            Communication Communication Communication: No apparent difficulties  Cognition Arousal: Alert Behavior During Therapy: WFL for tasks assessed/performed   PT - Cognitive impairments: No apparent impairments                         Following commands: Intact      Cueing Cueing Techniques: Verbal cues  Exercises      General Comments        Pertinent Vitals/Pain Pain Assessment Pain Assessment: No/denies pain     PT Goals (current goals can now be found in the care plan section) Acute Rehab PT Goals Patient Stated Goal: Hopes to get home soon PT Goal Formulation: With patient Time For Goal Achievement: 03/30/24 Progress towards PT goals: Progressing toward goals  Frequency    Min 3X/week       AM-PAC PT "6 Clicks" Mobility   Outcome Measure  Help needed turning from your back to your side while in a flat bed without using bedrails?: None Help needed moving from lying on your back to sitting on the side of a flat bed without using bedrails?: A Little Help needed moving to and from a bed to a chair (including a wheelchair)?: A Little Help needed standing up from a chair using your arms (e.g., wheelchair or bedside  chair)?: A Little Help needed to walk in hospital room?: A Little Help needed climbing 3-5 steps with a railing? : A Lot 6 Click Score: 18    End of Session Equipment Utilized During Treatment: Gait belt Activity Tolerance: Patient tolerated treatment well Patient left: with call bell/phone within reach;in chair;with chair alarm set Nurse Communication: Mobility status PT Visit Diagnosis: Unsteadiness on feet (R26.81)     Time: 0912-0923 PT Time Calculation (min) (ACUTE ONLY): 11 min  Charges:    $Gait Training: 8-22 mins PT General Charges $$ ACUTE PT VISIT: 1 Visit                     Michaelle Adolphus, PTA Acute Rehabilitation Services Secure Chat Preferred  [phone]    Michaelle Adolphus 03/18/2024, 10:10 AM

## 2024-03-19 DIAGNOSIS — E162 Hypoglycemia, unspecified: Secondary | ICD-10-CM | POA: Diagnosis not present

## 2024-03-19 LAB — CBC WITH DIFFERENTIAL/PLATELET
Abs Immature Granulocytes: 0.04 10*3/uL (ref 0.00–0.07)
Basophils Absolute: 0.1 10*3/uL (ref 0.0–0.1)
Basophils Relative: 1 %
Eosinophils Absolute: 0.3 10*3/uL (ref 0.0–0.5)
Eosinophils Relative: 3 %
HCT: 27.9 % — ABNORMAL LOW (ref 39.0–52.0)
Hemoglobin: 8.9 g/dL — ABNORMAL LOW (ref 13.0–17.0)
Immature Granulocytes: 0 %
Lymphocytes Relative: 15 %
Lymphs Abs: 1.4 10*3/uL (ref 0.7–4.0)
MCH: 28 pg (ref 26.0–34.0)
MCHC: 31.9 g/dL (ref 30.0–36.0)
MCV: 87.7 fL (ref 80.0–100.0)
Monocytes Absolute: 1.3 10*3/uL — ABNORMAL HIGH (ref 0.1–1.0)
Monocytes Relative: 13 %
Neutro Abs: 6.8 10*3/uL (ref 1.7–7.7)
Neutrophils Relative %: 68 %
Platelets: 206 10*3/uL (ref 150–400)
RBC: 3.18 MIL/uL — ABNORMAL LOW (ref 4.22–5.81)
RDW: 13.7 % (ref 11.5–15.5)
WBC: 9.9 10*3/uL (ref 4.0–10.5)
nRBC: 0 % (ref 0.0–0.2)

## 2024-03-19 LAB — GLUCOSE, CAPILLARY
Glucose-Capillary: 107 mg/dL — ABNORMAL HIGH (ref 70–99)
Glucose-Capillary: 110 mg/dL — ABNORMAL HIGH (ref 70–99)
Glucose-Capillary: 126 mg/dL — ABNORMAL HIGH (ref 70–99)
Glucose-Capillary: 153 mg/dL — ABNORMAL HIGH (ref 70–99)

## 2024-03-19 LAB — BASIC METABOLIC PANEL WITH GFR
Anion gap: 7 (ref 5–15)
BUN: 50 mg/dL — ABNORMAL HIGH (ref 8–23)
CO2: 22 mmol/L (ref 22–32)
Calcium: 8.2 mg/dL — ABNORMAL LOW (ref 8.9–10.3)
Chloride: 108 mmol/L (ref 98–111)
Creatinine, Ser: 2.39 mg/dL — ABNORMAL HIGH (ref 0.61–1.24)
GFR, Estimated: 27 mL/min — ABNORMAL LOW (ref >60)
Glucose, Bld: 119 mg/dL — ABNORMAL HIGH (ref 70–99)
Potassium: 4.7 mmol/L (ref 3.5–5.1)
Sodium: 137 mmol/L (ref 135–145)

## 2024-03-19 LAB — MAGNESIUM: Magnesium: 2.3 mg/dL (ref 1.7–2.4)

## 2024-03-19 MED ORDER — ISOSORBIDE MONONITRATE ER 30 MG PO TB24
90.0000 mg | ORAL_TABLET | Freq: Every day | ORAL | Status: DC
  Administered 2024-03-19: 90 mg via ORAL
  Filled 2024-03-19 (×2): qty 3

## 2024-03-19 NOTE — Progress Notes (Signed)
 Occupational Therapy Treatment Patient Details Name: Jake Gross MRN: [id number] DOB: 11-21-1940 Today's Date: 03/19/2024   History of present illness Jake Gross is a 83 y.o. male who presents to the ER from home via EMS due to recurrent hypoglycemia with his home meter reading blood sugars in the 50s; with medical history significant for CKD 4, essential hypertension, hyperlipidemia, insulin -dependent type 2 diabetes, chronic HFpEF, coronary artery disease status post CABG in 2020, persistent atrial fibrillation on Eliquis    OT comments  Pt progressing well towards goals. Progressed well to complete standing grooming task at sink with CGA. Pt requiring constant verbal cues for safety with RW. Pt reporting that his son manages his medications. Educated the pt on importance of logging medications and monitoring for hypoglycemic symptoms because they put him at an increased fall risk. Pt verbalized understanding. Continue to recommend HHOT to optimize independence levels. Will continue to follow acutely.       If plan is discharge home, recommend the following:  A little help with walking and/or transfers;A little help with bathing/dressing/bathroom;Assistance with cooking/housework;Assist for transportation;Help with stairs or ramp for entrance   Equipment Recommendations  BSC/3in1    Recommendations for Other Services      Precautions / Restrictions Precautions Precautions: Fall Recall of Precautions/Restrictions: Intact Restrictions Weight Bearing Restrictions Per Provider Order: Yes       Mobility Bed Mobility Overal bed mobility: Needs Assistance Bed Mobility: Sit to Supine       Sit to supine: Supervision, HOB elevated, Used rails   General bed mobility comments: mild use of bed features    Transfers Overall transfer level: Needs assistance Equipment used: Rolling walker (2 wheels) Transfers: Sit to/from Stand, Bed to chair/wheelchair/BSC Sit to Stand:  Supervision     Step pivot transfers: Contact guard assist     General transfer comment: Cues for safety with use of RW, and safe speed     Balance Overall balance assessment: Needs assistance Sitting-balance support: Bilateral upper extremity supported, Feet supported Sitting balance-Leahy Scale: Good     Standing balance support: Bilateral upper extremity supported, During functional activity Standing balance-Leahy Scale: Fair Standing balance comment: with RW support         ADL either performed or assessed with clinical judgement   ADL Overall ADL's : Needs assistance/impaired     Grooming: Wash/dry hands;Contact guard assist;Standing Grooming Details (indicate cue type and reason): CGA for balance heavy cues for safety with use of RW                 Toilet Transfer: Contact guard assist;Ambulation;Rolling walker (2 wheels);Regular Toilet;Grab bars Toilet Transfer Details (indicate cue type and reason): Use for safety with RW, CGA for balacne Toileting- Clothing Manipulation and Hygiene: Sit to/from stand;Contact guard assist;Cueing for safety Toileting - Clothing Manipulation Details (indicate cue type and reason): CGA, pt with heavy reliance on GB to steady while wiping     Functional mobility during ADLs: Contact guard assist;Rolling walker (2 wheels) General ADL Comments: Pt reporting son manages IADLs    Extremity/Trunk Assessment Upper Extremity Assessment Upper Extremity Assessment: Overall WFL for tasks assessed RUE Deficits / Details: Painful, but can still use RUE with basic ADLs   Lower Extremity Assessment Lower Extremity Assessment: Defer to PT evaluation        Vision   Vision Assessment?: No apparent visual deficits         Communication Communication Communication: No apparent difficulties Factors Affecting Communication: Hearing impaired  Cognition Arousal: Alert Behavior During Therapy: Impulsive       OT - Cognition  Comments: mild memory deficits, but oriented with increased time and follows up to 2 step commands.       Following commands: Impaired Following commands impaired: Follows one step commands with increased time      Cueing   Cueing Techniques: Verbal cues        General Comments VSS on RA    Pertinent Vitals/ Pain       Pain Assessment Pain Assessment: No/denies pain Pain Intervention(s): Monitored during session   Frequency  Min 1X/week        Progress Toward Goals  OT Goals(current goals can now be found in the care plan section)  Progress towards OT goals: Progressing toward goals  Acute Rehab OT Goals Patient Stated Goal: To go home OT Goal Formulation: With patient Time For Goal Achievement: 03/31/24 Potential to Achieve Goals: Good ADL Goals Pt Will Perform Grooming: with modified independence;standing Pt Will Perform Lower Body Dressing: with modified independence;sit to/from stand Pt Will Transfer to Toilet: with modified independence;ambulating Additional ADL Goal #1: Pt will perform pillbox assessment mod I for increased time.  Plan         AM-PAC OT "6 Clicks" Daily Activity     Outcome Measure   Help from another person eating meals?: None Help from another person taking care of personal grooming?: A Little Help from another person toileting, which includes using toliet, bedpan, or urinal?: A Little Help from another person bathing (including washing, rinsing, drying)?: A Little Help from another person to put on and taking off regular upper body clothing?: None Help from another person to put on and taking off regular lower body clothing?: A Little 6 Click Score: 20    End of Session Equipment Utilized During Treatment: Gait belt;Rolling walker (2 wheels)  OT Visit Diagnosis: Unsteadiness on feet (R26.81);Muscle weakness (generalized) (M62.81)   Activity Tolerance Patient tolerated treatment well   Patient Left in bed;with call bell/phone  within reach;with bed alarm set   Nurse Communication Mobility status        Time: 4098-1191 OT Time Calculation (min): 14 min  Charges: OT General Charges $OT Visit: 1 Visit OT Treatments $Self Care/Home Management : 8-22 mins  Delmer Ferraris, OT  Acute Rehabilitation Services Office [phone] Secure chat preferred   Mickael Alamo 03/19/2024, 1:12 PM

## 2024-03-19 NOTE — Discharge Summary (Signed)
 DISCHARGE SUMMARY  Jake Gross  MR#: [id number]  DOB:[birth year]  Date of Admission: 03/15/2024 Date of Discharge: 03/19/2024  Attending Physician:Anika Shore Constantine Delude, MD  Patient's WUJ:[id number], [name], NP  Disposition: Discharge home  Follow-up Appts:  Follow-up Information     Dorothe Gaster, NP. Schedule an appointment as soon as possible for a visit in 1 week.   Specialties: Nurse Practitioner, Family Medicine Contact information: [address] [place] [address] [phone]         [facility]  Follow up.   Contact information: [address] [place] [address] [phone]                 Tests Needing Follow-up: -Assess CBG log - Assure renal function stable and if creatinine has climbed again consider discontinuing Benicar  Discharge Diagnoses: Recurrent hypoglycemia and DM2 Acute metabolic acidosis Hyperkalemia CKD stage IV Anemia of chronic kidney disease Permanent atrial fibrillation HTN HLD Chronic diastolic CHF  Initial presentation: 83 year old with a history of CKD stage IV, HTN, HLD, DM2, diastolic CHF, CAD status post CABG, and A-fib on Eliquis  who was admitted to the hospital 5/31 due to recurrent episodes of hypoglycemia with CBGs measured in the 50s at home.   Hospital Course:  Recurrent hypoglycemia in DM2 Felt to be due to insulin  dosing complicated by CKD - CBG as low as 46 during this hospitalization -with discontinuation of insulin  CBGs have improved and stabilized-diet has been liberalized - A1c 5.5 01/25/2024 -counseled patient at length to discontinue insulin  dosing at home and to monitor CBGs very closely with follow-up with his PCP in 7-10 days -instructed patient to call his PCP should he encounter persistently elevated CBGs at home   Acute metabolic acidosis required bicarb drip with persistent acidosis initially - bicarb drip discontinued 6/3 and bicarb has remained stable  thereafter   Hyperkalemia Potassium 6.6 at presentation - corrected with volume expansion   CKD stage IV Creatinine at baseline at time of discharge   Anemia of chronic kidney disease Hemoglobin stable   Permanent atrial fibrillation Rate controlled -continue usual Eliquis    HTN Blood pressure reasonably controlled at present  HLD Continue usual statin dose   Chronic diastolic CHF EF 60-65% via TTE April 2024 - euvolemic on exam -given concern for potential worsening of renal function chlorthalidone was discontinued  Allergies as of 03/19/2024   No Known Allergies      Medication List     STOP taking these medications    amLODipine  10 MG tablet Commonly known as: NORVASC    BD Pen Needle Nano 2nd Gen 32G X 4 MM Misc Generic drug: Insulin  Pen Needle   chlorthalidone 25 MG tablet Commonly known as: HYGROTON   Lantus  SoloStar 100 UNIT/ML Solostar Pen Generic drug: insulin  glargine       TAKE these medications    Accu-Chek Guide test strip Generic drug: glucose blood 1 EACH BY OTHER ROUTE 3 (THREE) TIMES DAILY AS NEEDED FOR OTHER. USE AS INSTRUCTED   acetaminophen  325 MG tablet Commonly known as: TYLENOL  Take 2 tablets (650 mg total) by mouth every 6 (six) hours as needed for mild pain (or Fever >/= 101).   Eliquis  2.5 MG Tabs tablet Generic drug: apixaban  TAKE 1 TABLET BY MOUTH TWICE A DAY   Farxiga 10 MG Tabs tablet Generic drug: dapagliflozin propanediol Take 10 mg by mouth daily.   FreeStyle Libre 2 Reader Anderson Use to check blood sugars.  FreeStyle Libre 2 Sensor Misc Apply every 14 days to check blood sugars.   hydrALAZINE  100 MG tablet Commonly known as: APRESOLINE  TAKE 1 TABLET BY MOUTH 3 TIMES DAILY.   isosorbide  mononitrate 60 MG 24 hr tablet Commonly known as: IMDUR  Take 1.5 tablets (90 mg total) by mouth daily. What changed: additional instructions   olmesartan 5 MG tablet Commonly known as: BENICAR Take 5 mg by mouth daily.    onetouch ultrasoft lancets Use as instructed   rosuvastatin  20 MG tablet Commonly known as: CRESTOR  TAKE 1 TABLET BY MOUTH AT BEDTIME. SCHEDULE PHYSICAL EXAM   torsemide  20 MG tablet Commonly known as: DEMADEX  TAKE 1 TABLET BY MOUTH TWICE A DAY ON MONDAYS AND THURSDAYS ONLY, TAKE 1 TABLET BY MOUTH DAILY ONLY ALL OTHER DAYS               Durable Medical Equipment  (From admission, onward)           Start     Ordered   03/17/24 1346  For home use only DME 3 n 1  Once        03/17/24 1345            Day of Discharge BP (!) 145/83   Pulse 63   Temp 98 F (36.7 C) (Oral)   Resp 18   Ht 5\' 7"  (1.702 m)   Wt 87.8 kg   SpO2 97%   BMI 30.32 kg/m   Physical Exam: General: No acute respiratory distress Lungs: Clear to auscultation bilaterally without wheezes or crackles Cardiovascular: Regular rate and rhythm without murmur gallop or rub normal S1 and S2 Abdomen: Nontender, nondistended, soft, bowel sounds positive, no rebound, no ascites, no appreciable mass Extremities: No significant cyanosis, clubbing, or edema bilateral lower extremities  Basic Metabolic Panel: Recent Labs  Lab 03/15/24 1911 03/15/24 2013 03/15/24 2322 03/16/24 0653 03/17/24 0707 03/18/24 0551 03/19/24 0546  NA 140  --  140 139 135 135 137  K 6.6*   < > 5.5* 5.4* 5.0 4.6 4.7  CL 116*  --  116* 114* 108 106 108  CO2 16*  --  17* 16* 17* 20* 22  GLUCOSE 80  --  116* 62* 83 130* 119*  BUN 43*  --  42* 40* 38* 41* 50*  CREATININE 2.77*  --  2.74* 2.56* 2.53* 2.57* 2.39*  CALCIUM  8.4*  --  8.6* 8.6* 8.3* 7.9* 8.2*  MG 2.4  --   --   --  2.3 2.3 2.3  PHOS  --   --   --  5.1*  --   --   --    < > = values in this interval not displayed.    CBC: Recent Labs  Lab 03/15/24 1911 03/16/24 0653 03/17/24 0707 03/18/24 0551 03/19/24 0546  WBC 10.0 8.6 10.7* 9.8 9.9  NEUTROABS 7.1  --  6.9 6.8 6.8  HGB 9.1* 9.1* 10.3* 9.1* 8.9*  HCT 29.6* 29.7* 33.2* 28.5* 27.9*  MCV 92.5 93.4  90.5 86.9 87.7  PLT 245 211 264 207 206    Time spent in discharge (includes decision making & examination of pt): 35 minutes  03/19/2024, 2:57 PM   Abbe Abate, MD [facility] Office  [phone]

## 2024-03-19 NOTE — Plan of Care (Signed)

## 2024-03-19 NOTE — Care Management Important Message (Signed)
 Important Message  Patient Details  Name: Jake Gross MRN: [id number] Date of Birth: 1941/01/01   Important Message Given:  Yes - Medicare IM     Felix Host 03/19/2024, 11:07 AM

## 2024-03-19 NOTE — Progress Notes (Signed)
 Physical Therapy Treatment Patient Details Name: Jake Gross MRN: [id number] DOB: 05/01/1941 Today's Date: 03/19/2024   History of Present Illness Jake Gross is a 83 y.o. male who presents to the ER from home via EMS due to recurrent hypoglycemia with his home meter reading blood sugars in the 50s; with medical history significant for CKD 4, essential hypertension, hyperlipidemia, insulin -dependent type 2 diabetes, chronic HFpEF, coronary artery disease status post CABG in 2020, persistent atrial fibrillation on Eliquis     PT Comments  Pt received in supine and agreeable to session. Pt able to complete all mobility tasks with up to CGA for safety and cues for safety awareness throughout.  Pt demonstrates improved RW management after height adjustment. Pt able to tolerate 2 stair trials with cues for pacing and technique for improved stability. Pt states he is not tired after stair trials, however noted heavy breathing. Education on activity pacing and progression once returned home. Pt continues to benefit from PT services to progress toward functional mobility goals.    If plan is discharge home, recommend the following: A little help with walking and/or transfers;Assistance with cooking/housework;Assist for transportation   Can travel by private vehicle        Equipment Recommendations  Rolling walker (2 wheels);BSC/3in1    Recommendations for Other Services       Precautions / Restrictions Precautions Precautions: Fall Recall of Precautions/Restrictions: Intact     Mobility  Bed Mobility Overal bed mobility: Needs Assistance Bed Mobility: Supine to Sit     Supine to sit: Supervision, HOB elevated, Used rails          Transfers Overall transfer level: Needs assistance Equipment used: Rolling walker (2 wheels) Transfers: Sit to/from Stand Sit to Stand: Supervision           General transfer comment: cues for hand placement     Ambulation/Gait Ambulation/Gait assistance: Contact guard assist Gait Distance (Feet): 200 Feet Assistive device: Rolling walker (2 wheels) Gait Pattern/deviations: Step-through pattern, Trunk flexed       General Gait Details: Cues for upright posture and RW proximity. Noted improvement after adjusting RW height   Stairs Stairs: Yes Stairs assistance: Contact guard assist Stair Management: Forwards, One rail Right, Step to pattern, Alternating pattern Number of Stairs: 5 (x2) General stair comments: Pt initially demonstrating alternating pattern with slightly increased instability. Cues for step-to pattern on second trial with improved stability. CGA for safety   Wheelchair Mobility     Tilt Bed    Modified Rankin (Stroke Patients Only)       Balance Overall balance assessment: Needs assistance Sitting-balance support: Bilateral upper extremity supported, Feet supported Sitting balance-Leahy Scale: Good Sitting balance - Comments: sitting EOB   Standing balance support: Bilateral upper extremity supported, During functional activity Standing balance-Leahy Scale: Fair Standing balance comment: with RW support                            Communication Communication Communication: No apparent difficulties  Cognition Arousal: Alert Behavior During Therapy: WFL for tasks assessed/performed   PT - Cognitive impairments: Safety/Judgement                         Following commands: Intact      Cueing Cueing Techniques: Verbal cues  Exercises      General Comments        Pertinent Vitals/Pain Pain Assessment Pain Assessment: No/denies pain  PT Goals (current goals can now be found in the care plan section) Acute Rehab PT Goals Patient Stated Goal: Hopes to get home soon PT Goal Formulation: With patient Time For Goal Achievement: 03/30/24 Progress towards PT goals: Progressing toward goals    Frequency    Min  3X/week       AM-PAC PT "6 Clicks" Mobility   Outcome Measure  Help needed turning from your back to your side while in a flat bed without using bedrails?: None Help needed moving from lying on your back to sitting on the side of a flat bed without using bedrails?: A Little Help needed moving to and from a bed to a chair (including a wheelchair)?: A Little Help needed standing up from a chair using your arms (e.g., wheelchair or bedside chair)?: A Little Help needed to walk in hospital room?: A Little Help needed climbing 3-5 steps with a railing? : A Little 6 Click Score: 19    End of Session Equipment Utilized During Treatment: Gait belt Activity Tolerance: Patient tolerated treatment well Patient left: with call bell/phone within reach;in chair;with chair alarm set Nurse Communication: Mobility status PT Visit Diagnosis: Unsteadiness on feet (R26.81)     Time: 1610-9604 PT Time Calculation (min) (ACUTE ONLY): 13 min  Charges:    $Gait Training: 8-22 mins PT General Charges $$ ACUTE PT VISIT: 1 Visit                     Michaelle Adolphus, PTA Acute Rehabilitation Services Secure Chat Preferred  [phone]    Michaelle Adolphus 03/19/2024, 10:11 AM

## 2024-03-19 NOTE — Progress Notes (Signed)
 Reviewed AVS, patient expressed understanding of medications, MD follow up reviewed.   Removed IV, Site clean, dry and intact.  Patient states all belongings brought to the hospital at time of admission are accounted for and packed to take home.  Pt transported to Discharge lounge to wait for transportation home.

## 2024-03-20 ENCOUNTER — Other Ambulatory Visit: Payer: Self-pay

## 2024-03-20 ENCOUNTER — Telehealth: Payer: Self-pay

## 2024-03-20 ENCOUNTER — Encounter (HOSPITAL_COMMUNITY): Payer: Self-pay | Admitting: Emergency Medicine

## 2024-03-20 ENCOUNTER — Inpatient Hospital Stay (HOSPITAL_COMMUNITY)
Admission: EM | Admit: 2024-03-20 | Discharge: 2024-03-23 | DRG: 194 | Disposition: A | Discharge status: Home Health | Attending: Internal Medicine | Admitting: Internal Medicine

## 2024-03-20 ENCOUNTER — Emergency Department (HOSPITAL_COMMUNITY)

## 2024-03-20 DIAGNOSIS — I959 Hypotension, unspecified: Secondary | ICD-10-CM | POA: Diagnosis not present

## 2024-03-20 DIAGNOSIS — E875 Hyperkalemia: Secondary | ICD-10-CM | POA: Diagnosis not present

## 2024-03-20 DIAGNOSIS — I5031 Acute diastolic (congestive) heart failure: Secondary | ICD-10-CM | POA: Diagnosis not present

## 2024-03-20 DIAGNOSIS — R531 Weakness: Secondary | ICD-10-CM | POA: Diagnosis not present

## 2024-03-20 DIAGNOSIS — I482 Chronic atrial fibrillation, unspecified: Secondary | ICD-10-CM | POA: Diagnosis not present

## 2024-03-20 DIAGNOSIS — Z604 Social exclusion and rejection: Secondary | ICD-10-CM | POA: Diagnosis present

## 2024-03-20 DIAGNOSIS — I130 Hypertensive heart and chronic kidney disease with heart failure and stage 1 through stage 4 chronic kidney disease, or unspecified chronic kidney disease: Secondary | ICD-10-CM | POA: Diagnosis present

## 2024-03-20 DIAGNOSIS — Z823 Family history of stroke: Secondary | ICD-10-CM | POA: Diagnosis not present

## 2024-03-20 DIAGNOSIS — N179 Acute kidney failure, unspecified: Secondary | ICD-10-CM | POA: Diagnosis present

## 2024-03-20 DIAGNOSIS — J189 Pneumonia, unspecified organism: Principal | ICD-10-CM | POA: Diagnosis present

## 2024-03-20 DIAGNOSIS — I2510 Atherosclerotic heart disease of native coronary artery without angina pectoris: Secondary | ICD-10-CM | POA: Diagnosis not present

## 2024-03-20 DIAGNOSIS — R0902 Hypoxemia: Secondary | ICD-10-CM | POA: Diagnosis not present

## 2024-03-20 DIAGNOSIS — N184 Chronic kidney disease, stage 4 (severe): Secondary | ICD-10-CM | POA: Diagnosis not present

## 2024-03-20 DIAGNOSIS — Z8249 Family history of ischemic heart disease and other diseases of the circulatory system: Secondary | ICD-10-CM

## 2024-03-20 DIAGNOSIS — I480 Paroxysmal atrial fibrillation: Secondary | ICD-10-CM | POA: Diagnosis not present

## 2024-03-20 DIAGNOSIS — I10 Essential (primary) hypertension: Secondary | ICD-10-CM | POA: Diagnosis present

## 2024-03-20 DIAGNOSIS — Z8719 Personal history of other diseases of the digestive system: Secondary | ICD-10-CM

## 2024-03-20 DIAGNOSIS — I4891 Unspecified atrial fibrillation: Secondary | ICD-10-CM | POA: Diagnosis not present

## 2024-03-20 DIAGNOSIS — Z794 Long term (current) use of insulin: Secondary | ICD-10-CM | POA: Diagnosis not present

## 2024-03-20 DIAGNOSIS — Z951 Presence of aortocoronary bypass graft: Secondary | ICD-10-CM

## 2024-03-20 DIAGNOSIS — E785 Hyperlipidemia, unspecified: Secondary | ICD-10-CM | POA: Diagnosis present

## 2024-03-20 DIAGNOSIS — I517 Cardiomegaly: Secondary | ICD-10-CM | POA: Diagnosis not present

## 2024-03-20 DIAGNOSIS — I5032 Chronic diastolic (congestive) heart failure: Secondary | ICD-10-CM | POA: Diagnosis present

## 2024-03-20 DIAGNOSIS — Z8673 Personal history of transient ischemic attack (TIA), and cerebral infarction without residual deficits: Secondary | ICD-10-CM

## 2024-03-20 DIAGNOSIS — Z87891 Personal history of nicotine dependence: Secondary | ICD-10-CM

## 2024-03-20 DIAGNOSIS — R9431 Abnormal electrocardiogram [ECG] [EKG]: Secondary | ICD-10-CM | POA: Diagnosis present

## 2024-03-20 DIAGNOSIS — C801 Malignant (primary) neoplasm, unspecified: Secondary | ICD-10-CM | POA: Diagnosis present

## 2024-03-20 DIAGNOSIS — D631 Anemia in chronic kidney disease: Secondary | ICD-10-CM | POA: Diagnosis present

## 2024-03-20 DIAGNOSIS — Z833 Family history of diabetes mellitus: Secondary | ICD-10-CM | POA: Diagnosis not present

## 2024-03-20 DIAGNOSIS — R0989 Other specified symptoms and signs involving the circulatory and respiratory systems: Secondary | ICD-10-CM | POA: Diagnosis not present

## 2024-03-20 DIAGNOSIS — E8721 Acute metabolic acidosis: Secondary | ICD-10-CM | POA: Diagnosis present

## 2024-03-20 DIAGNOSIS — Z7901 Long term (current) use of anticoagulants: Secondary | ICD-10-CM | POA: Diagnosis not present

## 2024-03-20 DIAGNOSIS — E1122 Type 2 diabetes mellitus with diabetic chronic kidney disease: Secondary | ICD-10-CM | POA: Diagnosis present

## 2024-03-20 DIAGNOSIS — E119 Type 2 diabetes mellitus without complications: Secondary | ICD-10-CM | POA: Diagnosis not present

## 2024-03-20 DIAGNOSIS — E11649 Type 2 diabetes mellitus with hypoglycemia without coma: Secondary | ICD-10-CM | POA: Diagnosis present

## 2024-03-20 DIAGNOSIS — I4819 Other persistent atrial fibrillation: Secondary | ICD-10-CM | POA: Diagnosis not present

## 2024-03-20 DIAGNOSIS — I700 Atherosclerosis of aorta: Secondary | ICD-10-CM | POA: Diagnosis not present

## 2024-03-20 DIAGNOSIS — R0602 Shortness of breath: Principal | ICD-10-CM

## 2024-03-20 DIAGNOSIS — R0689 Other abnormalities of breathing: Secondary | ICD-10-CM | POA: Diagnosis not present

## 2024-03-20 DIAGNOSIS — D500 Iron deficiency anemia secondary to blood loss (chronic): Secondary | ICD-10-CM | POA: Diagnosis present

## 2024-03-20 LAB — CBC WITH DIFFERENTIAL/PLATELET
Abs Immature Granulocytes: 0.04 10*3/uL (ref 0.00–0.07)
Basophils Absolute: 0.1 10*3/uL (ref 0.0–0.1)
Basophils Relative: 1 %
Eosinophils Absolute: 0.1 10*3/uL (ref 0.0–0.5)
Eosinophils Relative: 1 %
HCT: 29.6 % — ABNORMAL LOW (ref 39.0–52.0)
Hemoglobin: 9.2 g/dL — ABNORMAL LOW (ref 13.0–17.0)
Immature Granulocytes: 0 %
Lymphocytes Relative: 10 %
Lymphs Abs: 1.2 10*3/uL (ref 0.7–4.0)
MCH: 28.5 pg (ref 26.0–34.0)
MCHC: 31.1 g/dL (ref 30.0–36.0)
MCV: 91.6 fL (ref 80.0–100.0)
Monocytes Absolute: 1.4 10*3/uL — ABNORMAL HIGH (ref 0.1–1.0)
Monocytes Relative: 13 %
Neutro Abs: 8.6 10*3/uL — ABNORMAL HIGH (ref 1.7–7.7)
Neutrophils Relative %: 75 %
Platelets: 244 10*3/uL (ref 150–400)
RBC: 3.23 MIL/uL — ABNORMAL LOW (ref 4.22–5.81)
RDW: 13.8 % (ref 11.5–15.5)
WBC: 11.4 10*3/uL — ABNORMAL HIGH (ref 4.0–10.5)
nRBC: 0 % (ref 0.0–0.2)

## 2024-03-20 LAB — COMPREHENSIVE METABOLIC PANEL WITH GFR
ALT: 17 U/L (ref 0–44)
AST: 31 U/L (ref 15–41)
Albumin: 2.9 g/dL — ABNORMAL LOW (ref 3.5–5.0)
Alkaline Phosphatase: 113 U/L (ref 38–126)
Anion gap: 14 (ref 5–15)
BUN: 62 mg/dL — ABNORMAL HIGH (ref 8–23)
CO2: 18 mmol/L — ABNORMAL LOW (ref 22–32)
Calcium: 8.7 mg/dL — ABNORMAL LOW (ref 8.9–10.3)
Chloride: 107 mmol/L (ref 98–111)
Creatinine, Ser: 2.66 mg/dL — ABNORMAL HIGH (ref 0.61–1.24)
GFR, Estimated: 23 mL/min — ABNORMAL LOW (ref >60)
Glucose, Bld: 125 mg/dL — ABNORMAL HIGH (ref 70–99)
Potassium: 5 mmol/L (ref 3.5–5.1)
Sodium: 139 mmol/L (ref 135–145)
Total Bilirubin: 0.9 mg/dL (ref 0.0–1.2)
Total Protein: 6.7 g/dL (ref 6.5–8.1)

## 2024-03-20 LAB — TSH: TSH: 2.672 u[IU]/mL (ref 0.350–4.500)

## 2024-03-20 LAB — IRON AND TIBC
Iron: 15 ug/dL — ABNORMAL LOW (ref 45–182)
Saturation Ratios: 7 % — ABNORMAL LOW (ref 17.9–39.5)
TIBC: 210 ug/dL — ABNORMAL LOW (ref 250–450)
UIBC: 195 ug/dL

## 2024-03-20 LAB — TROPONIN I (HIGH SENSITIVITY)
Troponin I (High Sensitivity): 25 ng/L — ABNORMAL HIGH (ref <18)
Troponin I (High Sensitivity): 28 ng/L — ABNORMAL HIGH (ref <18)

## 2024-03-20 LAB — FERRITIN: Ferritin: 86 ng/mL (ref 24–336)

## 2024-03-20 LAB — RETICULOCYTES
Immature Retic Fract: 23.2 % — ABNORMAL HIGH (ref 2.3–15.9)
RBC.: 2.92 MIL/uL — ABNORMAL LOW (ref 4.22–5.81)
Retic Count, Absolute: 73.9 10*3/uL (ref 19.0–186.0)
Retic Ct Pct: 2.5 % (ref 0.4–3.1)

## 2024-03-20 LAB — RESP PANEL BY RT-PCR (RSV, FLU A&B, COVID)  RVPGX2
Influenza A by PCR: NEGATIVE
Influenza B by PCR: NEGATIVE
Resp Syncytial Virus by PCR: NEGATIVE
SARS Coronavirus 2 by RT PCR: NEGATIVE

## 2024-03-20 LAB — BRAIN NATRIURETIC PEPTIDE: B Natriuretic Peptide: 537.5 pg/mL — ABNORMAL HIGH (ref 0.0–100.0)

## 2024-03-20 LAB — FOLATE: Folate: 15.9 ng/mL (ref >5.9)

## 2024-03-20 LAB — PROCALCITONIN: Procalcitonin: 0.3 ng/mL

## 2024-03-20 LAB — I-STAT CG4 LACTIC ACID, ED: Lactic Acid, Venous: 0.7 mmol/L (ref 0.5–1.9)

## 2024-03-20 LAB — T4, FREE: Free T4: 1.26 ng/dL — ABNORMAL HIGH (ref 0.61–1.12)

## 2024-03-20 LAB — MAGNESIUM: Magnesium: 2.5 mg/dL — ABNORMAL HIGH (ref 1.7–2.4)

## 2024-03-20 LAB — VITAMIN B12: Vitamin B-12: 151 pg/mL — ABNORMAL LOW (ref 180–914)

## 2024-03-20 LAB — CBG MONITORING, ED: Glucose-Capillary: 119 mg/dL — ABNORMAL HIGH (ref 70–99)

## 2024-03-20 MED ORDER — INSULIN ASPART 100 UNIT/ML IJ SOLN: 0.0000 [IU] | Freq: Three times a day (TID) | INTRAMUSCULAR | Status: DC

## 2024-03-20 MED ORDER — ACETAMINOPHEN 325 MG PO TABS: 650.0000 mg | ORAL_TABLET | Freq: Four times a day (QID) | ORAL | Status: DC | PRN

## 2024-03-20 MED ORDER — POLYETHYLENE GLYCOL 3350 17 G PO PACK: 17.0000 g | PACK | Freq: Every day | ORAL | Status: DC | PRN

## 2024-03-20 MED ORDER — INSULIN ASPART 100 UNIT/ML IJ SOLN: 0.0000 [IU] | Freq: Every day | INTRAMUSCULAR | Status: DC

## 2024-03-20 MED ORDER — PANTOPRAZOLE SODIUM 40 MG IV SOLR
40.0000 mg | Freq: Two times a day (BID) | INTRAVENOUS | Status: DC
  Administered 2024-03-20: 40 mg via INTRAVENOUS
  Filled 2024-03-20: qty 10

## 2024-03-20 MED ORDER — APIXABAN 2.5 MG PO TABS
2.5000 mg | ORAL_TABLET | Freq: Two times a day (BID) | ORAL | Status: DC
  Administered 2024-03-21 – 2024-03-23 (×6): 2.5 mg via ORAL
  Filled 2024-03-20 (×6): qty 1

## 2024-03-20 MED ORDER — ROSUVASTATIN CALCIUM 20 MG PO TABS
20.0000 mg | ORAL_TABLET | Freq: Every day | ORAL | Status: DC
  Administered 2024-03-21 – 2024-03-23 (×3): 20 mg via ORAL
  Filled 2024-03-20 (×3): qty 1

## 2024-03-20 MED ORDER — SODIUM CHLORIDE 0.9% FLUSH
3.0000 mL | Freq: Two times a day (BID) | INTRAVENOUS | Status: DC
  Administered 2024-03-21 – 2024-03-22 (×5): 3 mL via INTRAVENOUS

## 2024-03-20 MED ORDER — DOXYCYCLINE HYCLATE 100 MG PO TABS
100.0000 mg | ORAL_TABLET | Freq: Once | ORAL | Status: AC
  Administered 2024-03-20: 100 mg via ORAL
  Filled 2024-03-20: qty 1

## 2024-03-20 MED ORDER — CYANOCOBALAMIN 1000 MCG/ML IJ SOLN
1000.0000 ug | Freq: Once | INTRAMUSCULAR | Status: AC
  Administered 2024-03-21: 1000 ug via INTRAMUSCULAR
  Filled 2024-03-20: qty 1

## 2024-03-20 MED ORDER — HYDRALAZINE HCL 20 MG/ML IJ SOLN
5.0000 mg | INTRAMUSCULAR | Status: DC | PRN
Start: 2024-03-20 — End: 2024-03-23

## 2024-03-20 MED ORDER — ACETAMINOPHEN 650 MG RE SUPP: 650.0000 mg | Freq: Four times a day (QID) | RECTAL | Status: DC | PRN

## 2024-03-20 MED ORDER — FUROSEMIDE 10 MG/ML IJ SOLN
40.0000 mg | Freq: Once | INTRAMUSCULAR | Status: AC
  Administered 2024-03-20: 40 mg via INTRAVENOUS
  Filled 2024-03-20: qty 4

## 2024-03-20 MED ORDER — SODIUM CHLORIDE 0.9 % IV SOLN
1.0000 g | Freq: Once | INTRAVENOUS | Status: AC
  Administered 2024-03-20: 1 g via INTRAVENOUS
  Filled 2024-03-20: qty 10

## 2024-03-20 NOTE — ED Provider Notes (Signed)
 [place] EMERGENCY DEPARTMENT AT [facility] Provider Note   CSN: [id number] Arrival date & time: 03/20/24  1607     History  No chief complaint on file.   Jake Gross is a 83 y.o. male with a history of CKD stage IV, HTN, HLD, DM2, diastolic CHF, CAD status post CABG, and A-fib on Eliquis  who was admitted to the hospital 5/31 due to recurrent episodes of hypoglycemia with CBGs measured in the 50s at home and hyperkalemia, discharged yesterday.  Returns today with shortness of breath and  generalized weakness. He reports that he was to weak to ambulate or get out of bed. Normally he can use a walker but became to short of breath. He has a home health nurse that comes in to the house a couple times a week and today she came by the house and called 911 to have him brought back in due to his severe weakness.  No other concerning findings.  HPI     Home Medications Prior to Admission medications   Medication Sig Start Date End Date Taking? Authorizing Provider  ACCU-CHEK GUIDE test strip 1 EACH BY OTHER ROUTE 3 (THREE) TIMES DAILY AS NEEDED FOR OTHER. USE AS INSTRUCTED 04/23/23   Dorothe Gaster, NP  acetaminophen  (TYLENOL ) 325 MG tablet Take 2 tablets (650 mg total) by mouth every 6 (six) hours as needed for mild pain (or Fever >/= 101). 02/05/23   Elgergawy, Ardia Kraft, MD  Continuous Glucose Receiver (FREESTYLE LIBRE 2 READER) DEVI Use to check blood sugars. 03/30/23   Dorothe Gaster, NP  Continuous Glucose Sensor (FREESTYLE LIBRE 2 SENSOR) MISC Apply every 14 days to check blood sugars. 03/30/23   Dorothe Gaster, NP  dapagliflozin propanediol (FARXIGA) 10 MG TABS tablet Take 10 mg by mouth daily.    [provider]  ELIQUIS  2.5 MG TABS tablet TAKE 1 TABLET BY MOUTH TWICE A DAY 02/20/24   Odie Benne, MD  hydrALAZINE  (APRESOLINE ) 100 MG tablet TAKE 1 TABLET BY MOUTH 3 TIMES DAILY. 10/15/23   Marlyse Single T, PA-C  isosorbide  mononitrate (IMDUR ) 60 MG 24 hr  tablet Take 1.5 tablets (90 mg total) by mouth daily. Patient taking differently: Take 90 mg by mouth daily. May take an additional 1/2 tablet (30mg ) at bedtime as needed for hypertension 07/11/23 03/17/25  Marlyse Single T, PA-C  Lancets [facility] ULTRASOFT) lancets Use as instructed 04/27/21   Gwyndolyn Lerner, MD  olmesartan (BENICAR) 5 MG tablet Take 5 mg by mouth daily. 11/01/23   [provider]  rosuvastatin  (CRESTOR ) 20 MG tablet TAKE 1 TABLET BY MOUTH AT BEDTIME. SCHEDULE PHYSICAL EXAM 10/04/23   Dorothe Gaster, NP  torsemide  (DEMADEX ) 20 MG tablet TAKE 1 TABLET BY MOUTH TWICE A DAY ON MONDAYS AND THURSDAYS ONLY, TAKE 1 TABLET BY MOUTH DAILY ONLY ALL OTHER DAYS 04/25/23   Marlyse Single T, PA-C      Allergies    Patient has no known allergies.    Review of Systems   Review of Systems  Physical Exam Updated Vital Signs There were no vitals taken for this visit. Physical Exam Vitals and nursing note reviewed.  Constitutional:      General: He is not in acute distress.    Appearance: He is well-developed. He is not diaphoretic.  HENT:     Head: Normocephalic and atraumatic.  Eyes:     General: No scleral icterus.    Extraocular Movements: Extraocular movements intact.  Conjunctiva/sclera: Conjunctivae normal.     Pupils: Pupils are equal, round, and reactive to light.  Cardiovascular:     Rate and Rhythm: Normal rate and regular rhythm.     Heart sounds: Normal heart sounds.  Pulmonary:     Effort: Pulmonary effort is normal. Tachypnea present. No respiratory distress.     Breath sounds: Examination of the right-lower field reveals decreased breath sounds. Examination of the left-lower field reveals decreased breath sounds. Decreased breath sounds present.  Abdominal:     Palpations: Abdomen is soft.     Tenderness: There is no abdominal tenderness.  Musculoskeletal:     Cervical back: Normal range of motion and neck supple.     Right lower leg: 1+ Edema present.      Left lower leg: 1+ Edema present.  Skin:    General: Skin is warm and dry.  Neurological:     Mental Status: He is alert.  Psychiatric:        Behavior: Behavior normal.     ED Results / Procedures / Treatments   Labs (all labs ordered are listed, but only abnormal results are displayed) Labs Reviewed - No data to display  EKG None  Radiology No results found.  Procedures Procedures    Medications Ordered in ED Medications - No data to display  ED Course/ Medical Decision Making/ A&P                                 Medical Decision Making Amount and/or Complexity of Data Reviewed Labs: ordered. Radiology: ordered.  Risk Prescription drug management. Decision regarding hospitalization.   83 year old male discharged yesterday after admission returns today with generalized weakness. The differential diagnosis of weakness includes but is not limited to neurologic causes (GBS, myasthenia gravis, CVA, MS, ALS, transverse myelitis, spinal cord injury, CVA, botulism, ) and other causes: ACS, Arrhythmia, syncope, orthostatic hypotension, sepsis, hypoglycemia, electrolyte disturbance, hypothyroidism, respiratory failure, symptomatic anemia, dehydration, heat injury, polypharmacy, malignancy. Patient also short of breath The emergent differential diagnosis for shortness of breath includes, but is not limited to, Pulmonary edema, bronchoconstriction, Pneumonia, Pulmonary embolism, Pneumotherax/ Hemothorax, Dysrythmia, ACS.    I ordered labs.  Mildly elevated troponin is baseline for the patient in the setting of renal disease.  Respiratory panel negative.  CBC with slight mildly elevated white blood cell count and stable anemia likely of chronic disease.  BNP 537.5 l and is significantly more elevated than previous value.  Mildly elevated white blood cell count.  July's and interpreted 1 view chest x-ray which is calling for either interstitial edema and cardiomegaly versus  atypical infection.  Patient given IV Lasix  and coverage for community-acquired pneumonia with Rocephin and doxycycline  although clinically patient does not appear to be significantly volume overloaded.  EKG shows sinus rhythm at a rate of 63.  Given patient's generalized weakness, inability to get out of his bed and ambulate.  He lives alone.  Feel the patient would be safest to be observed overnight and likely TOC consult.  He is receiving IV Lasix .  Case discussed with Dr. Lydia Sams who will bring the patient in overnight.         Final Clinical Impression(s) / ED Diagnoses Final diagnoses:  None    Rx / DC Orders ED Discharge Orders     None         Tama Fails, PA-C 03/20/24 2111    Ninetta Basket,  MD 03/21/24 4098

## 2024-03-20 NOTE — H&P (Signed)
 History and Physical    Patient: Jake Gross DOB: 02/10/1941 DOA: 03/20/2024 DOS: the patient was seen and examined on 03/20/2024 PCP: Dorothe Gaster, NP  Patient coming from: Home Chief complaint: Chief Complaint  Patient presents with   Weakness   HPI:  Jake Gross is a 83 y.o. male with past medical history  of  CKD 4, essential hypertension, hyperlipidemia, insulin -dependent type 2 diabetes, chronic HFpEF, coronary artery disease status post CABG in 2020, persistent atrial fibrillation on Eliquis  living alone at home with [facility] nurse and at baseline is ambulatory with walker and independent with ADL , discharged yesterday at home with [facility] RN comes back today for weakness. Per daughter per RN she was worried about his potassium and his BP was 90's and EMS called said to go to ER. Admission requested and chart reviewed patient has been repeatedly reported to be hypoglycemic, per daughter PCP has been reducing his insulin  at has stopped his Lantus .  ED Course: Pt in ed at bedside  is Alert, oriented and  Vital signs in the ED were notable for the following:  Vitals:   03/20/24 2030 03/20/24 2100 03/20/24 2151 03/20/24 2215  BP: 120/78 (!) 126/55  112/63  Pulse: 70 65  (!) 58  Temp:   98.7 F (37.1 C)   Resp: 18 (!) 22  19  SpO2: 100% 100%  99%  TempSrc:   Oral   >>ED evaluation thus far shows: CMP shows bicarb of 18 glucose 125 BUN of 62 AKI with a creatinine of 2.66 on baseline of 2.3 current GFR of 23 normal LFTs. BNP of 537.5, troponin of 20 5 repeat troponin of 28, EKG showing A-fib with a heart rate of 67 no ST elevation or T wave inversions PR interval improved from previous EKG in May 2025. CBC shows mild leukocytosis of 11.4 and anemia with a hemoglobin of 9.2 which is chronic with MCV of 91.6 normal RDW and platelets 244.Aaron Aas  Overall patient has been intermittently anemic since 2008.  Which I suspect is a combination of chronic blood loss as patient has a  history of GI bleed but also anemia of chronic disease in his case chronic kidney disease. Chest x-ray done today shows cardiomegaly with interstitial edema or atypical viral infection. Lactic acid level added on and pending procalcitonin added on and pending magnesium  added on and pending.  >>While in the ED patient received the following: Medications  cefTRIAXone (ROCEPHIN) 1 g in sodium chloride  0.9 % 100 mL IVPB (has no administration in time range)  doxycycline  (VIBRA -TABS) tablet 100 mg (has no administration in time range)  furosemide  (LASIX ) injection 40 mg (has no administration in time range)  pantoprazole  (PROTONIX ) injection 40 mg (has no administration in time range)   Review of Systems  Constitutional:  Positive for malaise/fatigue.  Neurological:  Positive for weakness.   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   CAD (coronary artery disease) 09/29/2019   Cervical spondylosis    Chronic anticoagulation    Chronic diastolic CHF (congestive heart failure) (HCC) 12/17/2018   CKD (chronic kidney disease) 12/17/2018   Diabetes mellitus    History of GI bleed 09/29/2019   Hyperlipidemia    Hypertension  Iron deficiency anemia due to chronic blood loss    Paroxysmal atrial fibrillation (HCC) 12/17/2018   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 NO ISCHEMIA   COLONOSCOPY W/ POLYPECTOMY     5 polyps   COLONOSCOPY WITH PROPOFOL  N/A 08/29/2019   Procedure: COLONOSCOPY WITH PROPOFOL ;  Surgeon: Albertina Hugger, MD;  Location: [facility]  ENDOSCOPY;  Service: Gastroenterology;  Laterality: N/A;   CORONARY ARTERY BYPASS GRAFT N/A 11/26/2018   Procedure: CORONARY ARTERY BYPASS GRAFTING (CABG), ON PUMP, TIMES FOUR, USING LEFT INTERNAL MAMMARY ARTERY AND ENDOSCOPICALLY HARVESTED LEFT SAPHENOUS VEIN;  Surgeon: Bartley Lightning, MD;  Location: [facility] OR;  Service: Open Heart Surgery;  Laterality: N/A;   ESOPHAGOGASTRODUODENOSCOPY (EGD) WITH PROPOFOL  N/A 08/29/2019   Procedure: ESOPHAGOGASTRODUODENOSCOPY (EGD) WITH PROPOFOL ;  Surgeon: Albertina Hugger, MD;  Location: [facility] ENDOSCOPY;  Service: Gastroenterology;  Laterality: N/A;   EUS N/A 03/11/2015   Procedure: LOWER ENDOSCOPIC ULTRASOUND (EUS);  Surgeon: Janel Medford, MD;  Location: [facility] ENDOSCOPY;  Service: Endoscopy;  Laterality: N/A;   FLEXIBLE SIGMOIDOSCOPY N/A 02/02/2015   Procedure: FLEXIBLE SIGMOIDOSCOPY;  Surgeon: Claudette Cue, MD;  Location: [facility] ENDOSCOPY;  Service: Endoscopy;  Laterality: N/A;  ERBE   HEMOSTASIS CLIP PLACEMENT  08/29/2019   Procedure: HEMOSTASIS CLIP PLACEMENT;  Surgeon: Albertina Hugger, MD;  Location: [facility] ENDOSCOPY;  Service: Gastroenterology;;   HOT HEMOSTASIS N/A 08/29/2019   Procedure: HOT HEMOSTASIS (ARGON PLASMA COAGULATION/BICAP);  Surgeon: Albertina Hugger, MD;  Location: [facility] ENDOSCOPY;  Service: Gastroenterology;  Laterality: N/A;   LEFT HEART CATH AND CORONARY ANGIOGRAPHY N/A 11/20/2018   Procedure: LEFT HEART CATH AND CORONARY ANGIOGRAPHY;  Surgeon: Odie Benne, MD;  Location: [facility] INVASIVE CV LAB;  Service: Cardiovascular;  Laterality: N/A;   LOOP RECORDER INSERTION N/A 08/14/2017   Procedure: LOOP RECORDER INSERTION;  Surgeon: Jolly Needle, MD;  Location: [facility] INVASIVE CV LAB;  Service: Cardiovascular;  Laterality: N/A;   PARTIAL PROCTECTOMY BY TEM N/A 04/08/2015   Procedure: TEM PARTIAL PROCTECTOMY OF RECTAL MASS;  Surgeon: Candyce Champagne, MD;  Location: [facility] ORS;  Service: General;  Laterality: N/A;   POLYPECTOMY  08/29/2019   Procedure:  POLYPECTOMY;  Surgeon: Albertina Hugger, MD;  Location: [facility] ENDOSCOPY;  Service: Gastroenterology;;   TEE WITHOUT CARDIOVERSION N/A 06/25/2017   Procedure: TRANSESOPHAGEAL ECHOCARDIOGRAM (TEE);  Surgeon: Maudine Sos, MD;  Location: [facility] ENDOSCOPY;  Service: Cardiovascular;  Laterality: N/A;   TEE WITHOUT CARDIOVERSION N/A 11/26/2018   Procedure: TRANSESOPHAGEAL ECHOCARDIOGRAM (TEE);  Surgeon: Bartley Lightning, MD;  Location: [facility] OR;  Service: Open Heart Surgery;  Laterality: N/A;   TONSILLECTOMY AND ADENOIDECTOMY     as child    reports that he quit smoking about 29 years ago. His smoking use included cigarettes. He has never used smokeless tobacco. He reports that he does not drink alcohol and does not use drugs. No Known Allergies Family History  Problem Relation Age of Onset   Cancer Father        "all over"   Coronary artery disease Brother    Diabetes Brother    Stroke Mother    Diabetes Mother    Cancer Brother    Colon cancer Neg Hx    Esophageal cancer Neg Hx  Rectal cancer Neg Hx    Stomach cancer Neg Hx    Prior to Admission medications   Medication Sig Start Date End Date Taking? Authorizing Provider  ACCU-CHEK GUIDE test strip 1 EACH BY OTHER ROUTE 3 (THREE) TIMES DAILY AS NEEDED FOR OTHER. USE AS INSTRUCTED 04/23/23   Dorothe Gaster, NP  acetaminophen  (TYLENOL ) 325 MG tablet Take 2 tablets (650 mg total) by mouth every 6 (six) hours as needed for mild pain (or Fever >/= 101). 02/05/23   Elgergawy, Ardia Kraft, MD  Continuous Glucose Receiver (FREESTYLE LIBRE 2 READER) DEVI Use to check blood sugars. 03/30/23   Dorothe Gaster, NP  Continuous Glucose Sensor (FREESTYLE LIBRE 2 SENSOR) MISC Apply every 14 days to check blood sugars. 03/30/23   Dorothe Gaster, NP  dapagliflozin propanediol (FARXIGA) 10 MG TABS tablet Take 10 mg by mouth daily.    [provider]  ELIQUIS  2.5 MG TABS tablet TAKE 1 TABLET BY MOUTH TWICE A DAY 02/20/24   Odie Benne, MD  hydrALAZINE   (APRESOLINE ) 100 MG tablet TAKE 1 TABLET BY MOUTH 3 TIMES DAILY. 10/15/23   Marlyse Single T, PA-C  isosorbide  mononitrate (IMDUR ) 60 MG 24 hr tablet Take 1.5 tablets (90 mg total) by mouth daily. Patient taking differently: Take 90 mg by mouth daily. May take an additional 1/2 tablet (30mg ) at bedtime as needed for hypertension 07/11/23 03/17/25  Marlyse Single T, PA-C  Lancets [facility] ULTRASOFT) lancets Use as instructed 04/27/21   Gwyndolyn Lerner, MD  olmesartan (BENICAR) 5 MG tablet Take 5 mg by mouth daily. 11/01/23   [provider]  rosuvastatin  (CRESTOR ) 20 MG tablet TAKE 1 TABLET BY MOUTH AT BEDTIME. SCHEDULE PHYSICAL EXAM 10/04/23   Dorothe Gaster, NP  torsemide  (DEMADEX ) 20 MG tablet TAKE 1 TABLET BY MOUTH TWICE A DAY ON MONDAYS AND THURSDAYS ONLY, TAKE 1 TABLET BY MOUTH DAILY ONLY ALL OTHER DAYS 04/25/23   Marlyse Single T, PA-C                                                                                 Vitals:   03/20/24 2030 03/20/24 2100 03/20/24 2151 03/20/24 2215  BP: 120/78 (!) 126/55  112/63  Pulse: 70 65  (!) 58  Resp: 18 (!) 22  19  Temp:   98.7 F (37.1 C)   TempSrc:   Oral   SpO2: 100% 100%  99%   Physical Exam Constitutional:      General: He is not in acute distress.    Appearance: He is not ill-appearing.  HENT:     Right Ear: External ear normal.     Left Ear: External ear normal.  Eyes:     Extraocular Movements: Extraocular movements intact.  Cardiovascular:     Rate and Rhythm: Normal rate. Rhythm irregular.     Pulses: Normal pulses.     Heart sounds: Normal heart sounds.  Pulmonary:     Breath sounds: Rales present.  Abdominal:     General: Bowel sounds are normal.     Palpations: Abdomen is soft.  Musculoskeletal:     Right lower leg: Edema present.     Left  lower leg: Edema present.  Neurological:     General: No focal deficit present.     Mental Status: He is oriented to person, place, and time.     Labs on Admission: I have  personally reviewed following labs and imaging studies CBC: Recent Labs  Lab 03/15/24 1911 03/16/24 0653 03/17/24 0707 03/18/24 0551 03/19/24 0546 03/20/24 1627  WBC 10.0 8.6 10.7* 9.8 9.9 11.4*  NEUTROABS 7.1  --  6.9 6.8 6.8 8.6*  HGB 9.1* 9.1* 10.3* 9.1* 8.9* 9.2*  HCT 29.6* 29.7* 33.2* 28.5* 27.9* 29.6*  MCV 92.5 93.4 90.5 86.9 87.7 91.6  PLT 245 211 264 207 206 244   Basic Metabolic Panel: Recent Labs  Lab 03/15/24 1911 03/15/24 2013 03/16/24 0653 03/17/24 0707 03/18/24 0551 03/19/24 0546 03/20/24 1627 03/20/24 2230  NA 140   < > 139 135 135 137 139  --   K 6.6*   < > 5.4* 5.0 4.6 4.7 5.0  --   CL 116*   < > 114* 108 106 108 107  --   CO2 16*   < > 16* 17* 20* 22 18*  --   GLUCOSE 80   < > 62* 83 130* 119* 125*  --   BUN 43*   < > 40* 38* 41* 50* 62*  --   CREATININE 2.77*   < > 2.56* 2.53* 2.57* 2.39* 2.66*  --   CALCIUM  8.4*   < > 8.6* 8.3* 7.9* 8.2* 8.7*  --   MG 2.4  --   --  2.3 2.3 2.3  --  2.5*  PHOS  --   --  5.1*  --   --   --   --   --    < > = values in this interval not displayed.   GFR: Estimated Creatinine Clearance: 23 mL/min (A) (by C-G formula based on SCr of 2.66 mg/dL (H)). Liver Function Tests: Recent Labs  Lab 03/15/24 1911 03/17/24 0707 03/20/24 1627  AST 21 22 31   ALT 11 11 17   ALKPHOS 92 107 113  BILITOT 0.7 1.1 0.9  PROT 6.4* 6.5 6.7  ALBUMIN  3.0* 2.9* 2.9*   No results for input(s): "LIPASE", "AMYLASE" in the last 168 hours. No results for input(s): "AMMONIA" in the last 168 hours. Coagulation Profile: No results for input(s): "INR", "PROTIME" in the last 168 hours. Cardiac Enzymes: No results for input(s): "CKTOTAL", "CKMB", "CKMBINDEX", "TROPONINI" in the last 168 hours. BNP (last 3 results) No results for input(s): "PROBNP" in the last 8760 hours. HbA1C: No results for input(s): "HGBA1C" in the last 72 hours. CBG: Recent Labs  Lab 03/19/24 0046 03/19/24 0519 03/19/24 0813 03/19/24 1255 03/20/24 2306  GLUCAP  107* 110* 153* 126* 119*   Lipid Profile: No results for input(s): "CHOL", "HDL", "LDLCALC", "TRIG", "CHOLHDL", "LDLDIRECT" in the last 72 hours. Thyroid  Function Tests: Recent Labs    03/20/24 2230  TSH 2.672  FREET4 1.26*   Anemia Panel: Recent Labs    03/20/24 2230  FERRITIN 86  TIBC 210*  IRON 15*  RETICCTPCT 2.5   Urine analysis:    Component Value Date/Time   COLORURINE YELLOW 03/16/2024 0142   APPEARANCEUR CLEAR 03/16/2024 0142   LABSPEC 1.011 03/16/2024 0142   PHURINE 6.0 03/16/2024 0142   GLUCOSEU 150 (A) 03/16/2024 0142   HGBUR NEGATIVE 03/16/2024 0142   BILIRUBINUR NEGATIVE 03/16/2024 0142   KETONESUR NEGATIVE 03/16/2024 0142   PROTEINUR 100 (A) 03/16/2024 0142   UROBILINOGEN 0.2 04/11/2015 1658  NITRITE NEGATIVE 03/16/2024 0142   LEUKOCYTESUR NEGATIVE 03/16/2024 0142   Radiological Exams on Admission: DG Chest Port 1 View Result Date: 03/20/2024 CLINICAL DATA:  sob EXAM: PORTABLE CHEST - 1 VIEW COMPARISON:  Mar 15, 2024 FINDINGS: Low lung volumes. Bilateral perihilar interstitial opacities. No focal airspace consolidation, pleural effusion, or pneumothorax. Cardiomegaly. In planted cardiac loop recorder device. CABG markers and sternotomy wires. Tortuous aorta with aortic atherosclerosis. No acute fracture or destructive lesions. Multilevel thoracic osteophytosis. IMPRESSION: Cardiomegaly with findings of either interstitial edema or atypical/viral infection. Electronically Signed   By: Rance Burrows M.D.   On: 03/20/2024 17:06   Data Reviewed: Relevant notes from primary care and specialist visits, past discharge summaries as available in EHR, including Care Everywhere. Prior diagnostic testing as pertinent to current admission diagnoses, Updated medications and problem lists for reconciliation ED course, including vitals, labs, imaging, treatment and response to treatment,Triage notes, nursing and pharmacy notes and ED provider's notes Notable results as  noted in HPI.Discussed case with EDMD/ ED APP/ or Specialty MD on call and as needed.  Assessment & Plan  >>Generalized weakness: Multiple D/D in this patient. PNA, CHF,Anemia. Supportive measures correcting electrolytes aspiration fall precaution swallow evaluation. PT evaluation prior to discharge to assess disposition.   >> Abnormal chest x-ray: Suspect CHF more than pneumonia, procalcitonin level is pending. Patient also has received Lasix  in the emergency room. Patient has received antibiotics in the emergency room which we will continue if frequent as procalcitonin level is consistent with pneumonia..  Will admit patient to progressive unit or cardiac telemetry bed depending on bed availability.   >> Anemia: As stated above patient has been chronically anemic intermittently since 2008 however secondary to his history of adenocarcinoma and history of GI bleed will obtain stool guaiacs, type and screen, IV PPI, low threshold for GI consult will defer to a.m. team depending on additional findings. Suspect anemia has been longstanding is also contributing to his congestive heart failure increased demand generalized weakness deconditioning. Will obtain anemia panel and iron studies again low threshold for iron infusion...    Latest Ref Rng & Units 03/20/2024    4:27 PM 03/19/2024    5:46 AM 03/18/2024    5:51 AM  CBC  WBC 4.0 - 10.5 K/uL 11.4  9.9  9.8   Hemoglobin 13.0 - 17.0 g/dL 9.2  8.9  9.1   Hematocrit 39.0 - 52.0 % 29.6  27.9  28.5   Platelets 150 - 400 K/uL 244  206  207      >> Paroxysmal atrial fibrillation: Rate controlled currently patient is on Eliquis  which we will continue. Monitor CBC and follow.   >> Acute on chronic heart failure with preserved ejection fraction::: Patient is presenting with generalized weakness with elevated BNP and abnormal chest x-ray with interstitial edema concerning for CHF.   Most recent echocardiogram in 2004 : 1. Left ventricular ejection  fraction, by estimation, is 60 to 65% . The left ventricle has normal function. The left ventricle has no regional wall motion abnormalities. There is mild left ventricular hypertrophy. Left ventricular diastolic parameters are indeterminate. 2. Right ventricular systolic function is mildly reduced. The right ventricular size is normal. Tricuspid regurgitation signal is inadequate for assessing PA pressure. 3. Left atrial size was mildly dilated. 4. The mitral valve is degenerative. Trivial mitral valve regurgitation. No evidence of mitral stenosis. 5. The aortic valve is tricuspid. There is mild calcification of the aortic valve. There is mild thickening of the aortic  valve. Aortic valve regurgitation is trivial. No aortic stenosis is present. 6. Aortic dilatation noted. There is borderline dilatation of the ascending aorta, measuring 41 mm. 7. The inferior vena cava is normal in size with greater than 50% respiratory variability, suggesting right atrial pressure of 3 mmHg... - Daily weights strict I's and O's. -Patient received Lasix  in the emergency room and due to soft blood pressures I will currently hold patient's Lasix  with as needed only.  Consulted cardiology for evaluation and follow-up of patient's acute on chronic heart failure, atrial fibrillation, hypotension.  >> Hypoglycemia/diabetes mellitus type 2: -Currently patient's A1c is 4.9, will obtain a swallow eval of, POCT every 4 hours. Cardiac diet if patient passes a swallow evaluation. Due to hypoglycemia we will avoid all antihyperglycemic agents  including SGLT2's for GDMT for CHF.   >> AKI on CKD stage IIIb: Lab Results  Component Value Date   CREATININE 2.66 (H) 03/20/2024   CREATININE 2.39 (H) 03/19/2024   CREATININE 2.57 (H) 03/18/2024  Mild AKI, currently we will hold Benicar, torsemide .  Elvina Hammers.  Renally dose and avoid contrast.   >>NAGMA: 2/2 to CKD  , RTA, will still get lactic as pt had low bp earlier,  PO Sodium  Bicarb.     DVT prophylaxis:  Eliquis  Consults:  Cardiology.  Advance Care Planning:    Code Status: Full Code   Family Communication:  Daughter. Disposition Plan:  TBD.  Severity of Illness: The appropriate patient status for this patient is INPATIENT. Inpatient status is judged to be reasonable and necessary in order to provide the required intensity of service to ensure the patient's safety. The patient's presenting symptoms, physical exam findings, and initial radiographic and laboratory data in the context of their chronic comorbidities is felt to place them at high risk for further clinical deterioration. Furthermore, it is not anticipated that the patient will be medically stable for discharge from the hospital within 2 midnights of admission.   * I certify that at the point of admission it is my clinical judgment that the patient will require inpatient hospital care spanning beyond 2 midnights from the point of admission due to high intensity of service, high risk for further deterioration and high frequency of surveillance required.*  Unresulted Labs (From admission, onward)     Start     Ordered   03/21/24 0500  Occult blood card to lab, stool RN will collect  Daily,   R     Question:  Specimen to be collected by:  Answer:  RN will collect   03/20/24 2059   03/20/24 2204  Vitamin B12  (Anemia Panel (PNL))  Once,   URGENT        03/20/24 2204   03/20/24 2204  Folate  (Anemia Panel (PNL))  Once,   URGENT        03/20/24 2204   03/20/24 2058  Procalcitonin  Add-on,   AD       References:    Procalcitonin Lower Respiratory Tract Infection AND Sepsis Procalcitonin Algorithm   03/20/24 2057            Meds ordered this encounter  Medications   cefTRIAXone (ROCEPHIN) 1 g in sodium chloride  0.9 % 100 mL IVPB    Antibiotic Indication::   CAP   doxycycline  (VIBRA -TABS) tablet 100 mg   furosemide  (LASIX ) injection 40 mg   pantoprazole  (PROTONIX ) injection 40 mg    apixaban  (ELIQUIS ) tablet 2.5 mg   rosuvastatin  (CRESTOR ) tablet 20 mg  TAKE 1 TABLET BY MOUTH AT BEDTIME. SCHEDULE PHYSICAL EXAM     insulin  aspart (novoLOG ) injection 0-5 Units    Correction coverage::   HS scale    CBG < 70::   Implement Hypoglycemia Standing Orders and refer to Hypoglycemia Standing Orders sidebar report    CBG 70 - 120::   0 units    CBG 121 - 150::   0 units    CBG 151 - 200::   0 units    CBG 201 - 250::   2 units    CBG 251 - 300::   3 units    CBG 301 - 350::   4 units    CBG 351 - 400::   5 units    CBG > 400:   call MD and obtain STAT lab verification   sodium chloride  flush (NS) 0.9 % injection 3 mL   OR Linked Order Group    acetaminophen  (TYLENOL ) tablet 650 mg    acetaminophen  (TYLENOL ) suppository 650 mg   polyethylene glycol (MIRALAX  / GLYCOLAX ) packet 17 g   hydrALAZINE  (APRESOLINE ) injection 5 mg   insulin  aspart (novoLOG ) injection 0-6 Units    Correction coverage::   Very Sensitive (ESRD/Dialysis)    CBG < 70::   Implement Hypoglycemia Standing Orders and refer to Hypoglycemia Standing Orders sidebar report    CBG 70 - 120::   0 units    CBG 121 - 150::   0 units    CBG 151 - 200::   1 unit    CBG 201-250::   2 units    CBG 251-300::   3 units    CBG 301-350::   4 units    CBG 351-400::   5 units    CBG > 400:   Give 6 units and call MD    Orders Placed This Encounter  Procedures   Resp panel by RT-PCR (RSV, Flu A&B, Covid) Anterior Nasal Swab   DG Chest Port 1 View   CBC with Differential   Brain natriuretic peptide   Comprehensive metabolic panel   Magnesium    Procalcitonin   Occult blood card to lab, stool RN will collect   Vitamin B12   Folate   Iron and TIBC   Ferritin   Reticulocytes   T4, free   TSH   Diet Heart Room service appropriate? Yes with Assist; Fluid consistency: Nectar Thick; Fluid restriction: 1500 mL Fluid   ED Cardiac monitoring   Notify physician (specify)   Initiate Heart Failure Care Plan   Daily  weights   Strict intake and output   In and Out Cath   Patient Education:   Apply Heart Failure Care Plan   [facility] and [facility] only) Obtain REDS clips reading Every morning   Apply Diabetes Mellitus Care Plan   STAT CBG when hypoglycemia is suspected. If treated, recheck every 15 minutes after each treatment until CBG >/= 70 mg/dl   Refer to Hypoglycemia Protocol Sidebar Report for treatment of CBG < 70 mg/dl   Cardiac Monitoring Continuous x 24 hours Indications for use: Sub-acute heart failure   Maintain IV access   Vital signs   Notify physician (specify)   Mobility Protocol: No Restrictions RN to initiate protocols based on patient's level of care   Refer to Sidebar Report Refer to ICU, Med-Surg, Progressive, and Step-Down Mobility Protocol Sidebars   Initiate Adult Central Line Maintenance and Catheter Protocol for patients with central line (CVC, PICC, Port, Hemodialysis, Trialysis)  If patient diabetic or glucose greater than 140 notify physician for Sliding Scale Insulin  Orders   Do not place and if present remove PureWick   Initiate Oral Care Protocol   Initiate Carrier Fluid Protocol   RN may order General Admission PRN Orders utilizing "General Admission PRN medications" (through manage orders) for the following patient needs: allergy symptoms (Claritin), cold sores (Carmex), cough (Robitussin DM), eye irritation (Liquifilm Tears), hemorrhoids (Tucks), indigestion (Maalox), minor skin irritation (Hydrocortisone Cream), muscle pain Lovena Rubinstein Gay), nose irritation (saline nasal spray) and sore throat (Chloraseptic spray).   Full code   Consult to hospitalist   Inpatient consult to Cardiology Consult Timeframe: ROUTINE - requires response within 24 hours; Reason for Consult? CHF Already called   Consult to Transition of Care Team   Consult to Heart Failure Navigation Team [facility], [facility], and [facility])   Nutritional services consult   OT eval and treat   PT eval and treat   ED Pulse oximetry,  continuous   Pulse oximetry check with vital signs   Oxygen therapy Mode or (Route): Nasal cannula; Liters Per Minute: 2; Keep O2 saturation between: greater than 92 %   Incentive spirometry   I-Stat CG4 Lactic Acid   CBG monitoring, ED   ED EKG   EKG 12-Lead   Type and screen   Insert peripheral IV   Insert peripheral IV   Admit to Inpatient (patient's expected length of stay will be greater than 2 midnights or inpatient only procedure)   Aspiration precautions   Fall precautions     Author: Lavanda Porter, MD  [facility] 03/20/2024 11:43 PM >>Please note for any concern,or critical results please contact [facility] PROVIDER assigned.

## 2024-03-20 NOTE — Transitions of Care (Post Inpatient/ED Visit) (Signed)
   03/20/2024  Name: Jake Gross MRN: [id number] DOB: [birth year]  Today's TOC FU Call Status: Today's TOC FU Call Status:: Unsuccessful Call (1st Attempt) Unsuccessful Call (1st Attempt) Date: 03/20/24  Attempted to reach the patient regarding the most recent Inpatient/ED visit.  Left a HIPAA approved voice mail message requesting a return call.  Follow Up Plan: Additional outreach attempts will be made to reach the patient to complete the Transitions of Care (Post Inpatient/ED visit) call.   [place], RN, BSN, CCM [facility] RN Care Manager Direct Dial: [phone]

## 2024-03-20 NOTE — ED Triage Notes (Signed)
 Pt BIB [facility] from home due to increased weakness after breakfast this morning. Pt was just discharged yesterday but was hyperkalemic. Hx CHF 18g left AC. VS CBG 157, SpO2 96%RA

## 2024-03-21 ENCOUNTER — Encounter (HOSPITAL_COMMUNITY): Payer: Self-pay | Admitting: Internal Medicine

## 2024-03-21 DIAGNOSIS — R531 Weakness: Secondary | ICD-10-CM | POA: Diagnosis not present

## 2024-03-21 DIAGNOSIS — I480 Paroxysmal atrial fibrillation: Secondary | ICD-10-CM | POA: Diagnosis not present

## 2024-03-21 DIAGNOSIS — I5031 Acute diastolic (congestive) heart failure: Secondary | ICD-10-CM

## 2024-03-21 DIAGNOSIS — J189 Pneumonia, unspecified organism: Secondary | ICD-10-CM | POA: Diagnosis not present

## 2024-03-21 LAB — TYPE AND SCREEN

## 2024-03-21 LAB — GLUCOSE, CAPILLARY
Glucose-Capillary: 122 mg/dL — ABNORMAL HIGH (ref 70–99)
Glucose-Capillary: 135 mg/dL — ABNORMAL HIGH (ref 70–99)
Glucose-Capillary: 151 mg/dL — ABNORMAL HIGH (ref 70–99)

## 2024-03-21 LAB — CBG MONITORING, ED: Glucose-Capillary: 91 mg/dL (ref 70–99)

## 2024-03-21 LAB — POC OCCULT BLOOD, ED: Fecal Occult Bld: NEGATIVE

## 2024-03-21 MED ORDER — DOXYCYCLINE HYCLATE 100 MG PO TABS
100.0000 mg | ORAL_TABLET | Freq: Two times a day (BID) | ORAL | Status: DC
  Administered 2024-03-21 – 2024-03-23 (×5): 100 mg via ORAL
  Filled 2024-03-21 (×5): qty 1

## 2024-03-21 MED ORDER — SODIUM CHLORIDE 0.9 % IV SOLN
1.0000 g | INTRAVENOUS | Status: DC
  Administered 2024-03-21 – 2024-03-22 (×2): 1 g via INTRAVENOUS
  Filled 2024-03-21 (×2): qty 10

## 2024-03-21 MED ORDER — PANTOPRAZOLE SODIUM 40 MG PO TBEC
40.0000 mg | DELAYED_RELEASE_TABLET | Freq: Two times a day (BID) | ORAL | Status: DC
  Administered 2024-03-21 – 2024-03-23 (×5): 40 mg via ORAL
  Filled 2024-03-21 (×5): qty 1

## 2024-03-21 NOTE — Plan of Care (Signed)

## 2024-03-21 NOTE — Consult Note (Signed)
 Cardiology Consultation   Patient ID: Jake Gross MRN: [id number]; DOB: Oct 18, 1940  Admit date: 03/20/2024 Date of Consult: 03/21/2024  PCP:  Dorothe Gaster, NP   [facility] Providers Cardiologist:  Antoinette Batman, MD  Electrophysiologist:  Jolly Needle, MD (Inactive)    Patient Profile: Jake Gross is a 83 y.o. male with a hx of CAD s/p CABG in 11/2018, hypertension, hyperlipidemia, CKD 3-4, prior CVA, persistent atrial fibrillation on eliquis , and GI bleed November 2020 who is being seen 03/21/2024 for the evaluation of CHF at the request of Dr. Versa Gore.  History of Present Illness:  [name] underwent CABG x 4 in February 2020 after left heart catheterization showed severe left main and three-vessel CAD.  Echocardiogram at that time showed a preserved LVEF 60 to 65% and no significant valvular disease.  Unfortunately he had a GI bleed November 2020.  He had workup with GI showing small bowel AVMs, and was cleared to restart Eliquis  for CHA2DS2-VASc of 8.  Repeat echocardiogram showed normal LV function and mild to moderate MR.  He was treated with Demadex  and he followed close with nephrology for CKD 3-4.  Echocardiogram 07/2021 showed continued preserved LVEF and grade 2 DD.  He was admitted twice in April 2024 with acute on chronic kidney failure secondary to gastroenteritis.  Telemetry showed pauses of 2.5 seconds resulting in discontinuation of his beta-blocker.  HCTZ was discontinued and torsemide  was reduced.  Echocardiogram 01/2023 showed preserved LVEF 60 to 65%, mild LVH, mildly reduced RV, trivial MR and ascending aorta 41 mm.  When last seen in clinic he was alternating 20 and 40 mg of torsemide .  He follows with nephrology.  Previously been taken off of SGLT2 inhibitor due to renal function.    He was hospitalized 03/16/2024 due to recurrent episodes of hypoglycemia, CBG at home was in the 50s.  Hypoglycemia in the setting of DM2 felt likely due to  insulin  dosing complicated by CKD.  A1c was 5.5%.  Insulin  was discontinued and diet was liberalized.  Acute metabolic acidosis required bicarb drip.  Hyperkalemia with K6.6 on presentation corrected with volume expansion.  He was discharged on 03/19/2024.  He was BIP via [facility] from home due to increased weakness yesterday morning, 03/20/2024.  Workup in the ER showed serum creatinine 2.66 up from baseline 2.3, and BNP 538.  Imaging with bilateral infiltrates suspicious for atypical versus aspiration pneumonia.  Respiratory panel negative.  Given leukocytosis with a left shift, he was started on antibiotics.  Procalcitonin negative.   He was initially treated with IV Lasix  in the ER, but this was discontinued as he appears euvolemic on exam.  Gentle fluids started  Cardiology consulted.  On my exam he reported feeling weak.  He denies dizziness and syncope.  He states he woke up feeling ill and with a cough.  He has been compliant on cardiac medications.  He denies feeling volume up.  He reports shortness of breath with cough.  He denies lower extremity swelling and orthopnea.   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   CAD (coronary artery disease) 09/29/2019   Cervical spondylosis    Chronic anticoagulation    Chronic diastolic CHF (congestive heart failure) (HCC) 12/17/2018  CKD (chronic kidney disease) 12/17/2018   Diabetes mellitus    History of GI bleed 09/29/2019   Hyperlipidemia    Hypertension    Iron deficiency anemia due to chronic blood loss    Paroxysmal atrial fibrillation (HCC) 12/17/2018   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 NO ISCHEMIA   COLONOSCOPY W/ POLYPECTOMY     5 polyps   COLONOSCOPY WITH PROPOFOL  N/A 08/29/2019   Procedure: COLONOSCOPY WITH PROPOFOL ;  Surgeon: Albertina Hugger, MD;  Location[facility] ENDOSCOPY;  Service: Gastroenterology;  Laterality: N/A;   CORONARY ARTERY BYPASS GRAFT N/A 11/26/2018   Procedure: CORONARY ARTERY BYPASS GRAFTING (CABG), ON PUMP, TIMES FOUR, USING LEFT INTERNAL MAMMARY ARTERY AND ENDOSCOPICALLY HARVESTED LEFT SAPHENOUS VEIN;  Surgeon: Bartley Lightning, MD;  Location: [facility] OR;  Service: Open Heart Surgery;  Laterality: N/A;   ESOPHAGOGASTRODUODENOSCOPY (EGD) WITH PROPOFOL  N/A 08/29/2019   Procedure: ESOPHAGOGASTRODUODENOSCOPY (EGD) WITH PROPOFOL ;  Surgeon: Albertina Hugger, MD;  Location: [facility] ENDOSCOPY;  Service: Gastroenterology;  Laterality: N/A;   EUS N/A 03/11/2015   Procedure: LOWER ENDOSCOPIC ULTRASOUND (EUS);  Surgeon: Janel Medford, MD;  Location: [facility] ENDOSCOPY;  Service: Endoscopy;  Laterality: N/A;   FLEXIBLE SIGMOIDOSCOPY N/A 02/02/2015   Procedure: FLEXIBLE SIGMOIDOSCOPY;  Surgeon: Claudette Cue, MD;  Location: [facility] ENDOSCOPY;  Service: Endoscopy;  Laterality: N/A;  ERBE   HEMOSTASIS CLIP PLACEMENT  08/29/2019   Procedure: HEMOSTASIS CLIP PLACEMENT;  Surgeon: Albertina Hugger, MD;  Location: [facility] ENDOSCOPY;  Service: Gastroenterology;;   HOT HEMOSTASIS N/A 08/29/2019   Procedure: HOT HEMOSTASIS (ARGON PLASMA COAGULATION/BICAP);  Surgeon: Albertina Hugger, MD;  Location: [facility] ENDOSCOPY;  Service: Gastroenterology;  Laterality: N/A;   LEFT HEART CATH AND CORONARY ANGIOGRAPHY N/A 11/20/2018   Procedure: LEFT HEART CATH AND CORONARY ANGIOGRAPHY;  Surgeon: Odie Benne, MD;  Location: [facility] INVASIVE CV LAB;  Service: Cardiovascular;  Laterality: N/A;   LOOP RECORDER INSERTION N/A 08/14/2017   Procedure: LOOP RECORDER INSERTION;  Surgeon: Jolly Needle, MD;  Location: [facility]  INVASIVE CV LAB;  Service: Cardiovascular;  Laterality: N/A;   PARTIAL PROCTECTOMY BY TEM N/A 04/08/2015   Procedure: TEM PARTIAL PROCTECTOMY OF RECTAL MASS;  Surgeon: Candyce Champagne, MD;  Location: [facility] ORS;  Service: General;  Laterality: N/A;   POLYPECTOMY  08/29/2019   Procedure: POLYPECTOMY;  Surgeon: Albertina Hugger, MD;  Location: [facility] ENDOSCOPY;  Service: Gastroenterology;;   TEE WITHOUT CARDIOVERSION N/A 06/25/2017   Procedure: TRANSESOPHAGEAL ECHOCARDIOGRAM (TEE);  Surgeon: Maudine Sos, MD;  Location: [facility] ENDOSCOPY;  Service: Cardiovascular;  Laterality: N/A;   TEE WITHOUT CARDIOVERSION N/A 11/26/2018   Procedure: TRANSESOPHAGEAL ECHOCARDIOGRAM (TEE);  Surgeon: Bartley Lightning, MD;  Location: [facility] OR;  Service: Open Heart Surgery;  Laterality: N/A;   TONSILLECTOMY AND ADENOIDECTOMY     as child     Home Medications:  Prior to Admission medications   Medication Sig Start Date End Date Taking? Authorizing Provider  acetaminophen  (TYLENOL ) 325 MG tablet Take 2 tablets (650 mg total) by mouth every 6 (six) hours as needed for mild pain (or Fever >/= 101). 02/05/23  Yes Elgergawy, Ardia Kraft, MD  dapagliflozin propanediol (FARXIGA) 10 MG TABS tablet Take 10 mg by mouth daily.   Yes [provider]  ELIQUIS  2.5 MG TABS tablet TAKE 1 TABLET BY MOUTH TWICE A DAY 02/20/24  Yes Odie Benne, MD  hydrALAZINE  (APRESOLINE ) 100 MG tablet TAKE 1 TABLET BY MOUTH 3 TIMES DAILY. 10/15/23  Yes Weaver, Scott T, PA-C  olmesartan (BENICAR) 5 MG tablet Take 5 mg by mouth daily. 11/01/23  Yes [provider]  rosuvastatin  (CRESTOR ) 20 MG tablet TAKE 1 TABLET BY MOUTH AT BEDTIME. SCHEDULE PHYSICAL EXAM 10/04/23  Yes Dorothe Gaster, NP  torsemide  (DEMADEX ) 20 MG tablet TAKE 1 TABLET BY MOUTH TWICE A DAY ON MONDAYS AND THURSDAYS ONLY, TAKE 1 TABLET BY MOUTH DAILY ONLY ALL OTHER DAYS 04/25/23  Yes Weaver, Scott T, PA-C  ACCU-CHEK GUIDE test strip 1 EACH BY OTHER ROUTE 3 (THREE) TIMES DAILY AS  NEEDED FOR OTHER. USE AS INSTRUCTED 04/23/23   Dorothe Gaster, NP  Continuous Glucose Receiver (FREESTYLE LIBRE 2 READER) DEVI Use to check blood sugars. 03/30/23   Dorothe Gaster, NP  Continuous Glucose Sensor (FREESTYLE LIBRE 2 SENSOR) MISC Apply every 14 days to check blood sugars. 03/30/23   Dorothe Gaster, NP  isosorbide  mononitrate (IMDUR ) 60 MG 24 hr tablet Take 1.5 tablets (90 mg total) by mouth daily. Patient taking differently: Take 90 mg by mouth daily. May take an additional 1/2 tablet (30mg ) at bedtime as needed for hypertension 07/11/23 03/17/25  Marlyse Single T, PA-C  Lancets [facility] ULTRASOFT) lancets Use as instructed 04/27/21   Gwyndolyn Lerner, MD    Scheduled Meds:  apixaban   2.5 mg Oral BID   doxycycline   100 mg Oral BID   pantoprazole   40 mg Oral BID   rosuvastatin   20 mg Oral Daily   sodium chloride  flush  3 mL Intravenous Q12H   Continuous Infusions:  cefTRIAXone (ROCEPHIN)  IV     PRN Meds: acetaminophen  **OR** acetaminophen , hydrALAZINE , polyethylene glycol  Allergies:   No Known Allergies  Social History:   Social History   Socioeconomic History   Marital status: Divorced    Spouse name: Not on file   Number of children: 2   Years of education: Not on file   Highest education level: Not on file  Occupational History   Occupation: retired    Associate Professor: [employer]  Tobacco Use   Smoking status: Former    Current packs/day: 0.00    Types: Cigarettes    Quit date: 08/13/1994    Years since quitting: 29.6   Smokeless tobacco: Never  Vaping Use   Vaping status: Never Used  Substance and Sexual Activity   Alcohol use: No    Alcohol/week: 0.0 standard drinks of alcohol   Drug use: No   Sexual activity: Not Currently    Birth control/protection: None  Other Topics Concern   Not on file  Social History Narrative   Not on file   Social Drivers of Health   Financial Resource Strain: Low Risk  (12/11/2023)   Overall Financial Resource Strain (CARDIA)     Difficulty of Paying Living Expenses: Not hard at all  Food Insecurity: No Food Insecurity (03/16/2024)   Hunger Vital Sign    Worried About Running Out of Food in the Last Year: Never true    Ran Out of Food in the Last Year: Never true  Transportation Needs: No Transportation Needs (03/16/2024)   PRAPARE - Administrator, Civil Service (Medical): No    Lack of Transportation (Non-Medical): No  Physical Activity: Insufficiently Active (12/11/2023)   Exercise Vital  Sign    Days of Exercise per Week: 4 days    Minutes of Exercise per Session: 20 min  Stress: No Stress Concern Present (12/11/2023)   [employer] of Occupational Health - Occupational Stress Questionnaire    Feeling of Stress : Not at all  Social Connections: Socially Isolated (03/16/2024)   Social Connection and Isolation Panel [NHANES]    Frequency of Communication with Friends and Family: Once a week    Frequency of Social Gatherings with Friends and Family: Twice a week    Attends Religious Services: Never    Database administrator or Organizations: No    Attends Banker Meetings: Never    Marital Status: Divorced  Catering manager Violence: Not At Risk (03/16/2024)   Humiliation, Afraid, Rape, and Kick questionnaire    Fear of Current or Ex-Partner: No    Emotionally Abused: No    Physically Abused: No    Sexually Abused: No    Family History:    Family History  Problem Relation Age of Onset   Cancer Father        "all over"   Coronary artery disease Brother    Diabetes Brother    Stroke Mother    Diabetes Mother    Cancer Brother    Colon cancer Neg Hx    Esophageal cancer Neg Hx    Rectal cancer Neg Hx    Stomach cancer Neg Hx      ROS:  Please see the history of present illness.   All other ROS reviewed and negative.     Physical Exam/Data: Vitals:   03/21/24 0658 03/21/24 0700 03/21/24 1007 03/21/24 1119  BP:  (!) 124/56 (!) 122/54   Pulse:  63 75   Resp:  19 18    Temp: 99 F (37.2 C)   98.6 F (37 C)  TempSrc: Oral   Oral  SpO2:  97% 99%     Intake/Output Summary (Last 24 hours) at 03/21/2024 1142 Last data filed at 03/21/2024 0043 Gross per 24 hour  Intake 100 ml  Output 300 ml  Net -200 ml      03/19/2024    5:00 AM 03/18/2024    5:00 AM 03/17/2024    5:00 AM  Last 3 Weights  Weight (lbs) 193 lb 9 oz 190 lb 4.1 oz 188 lb 15 oz  Weight (kg) 87.8 kg 86.3 kg 85.7 kg     There is no height or weight on file to calculate BMI.  General:  elderly male in NAD HEENT: normal Neck: no JVD Vascular: No carotid bruits; Distal pulses 2+ bilaterally Cardiac:  irregular rhythm, bradycardic rate Lungs:  diminished in bases,   Abd: soft, nontender, no hepatomegaly  Ext: no edema Musculoskeletal:  No deformities, BUE and BLE strength normal and equal Skin: warm and dry  Neuro:  CNs 2-12 intact, no focal abnormalities noted Psych:  Normal affect   EKG:  The EKG was personally reviewed and demonstrates:  Afib with VR 63 Telemetry:  Telemetry was personally reviewed and demonstrates:  Afib with pauses generally less than 2.5 sec, rates in the 30-60s, one pause of 2.95 sec  Relevant CV Studies:  Echo 01/2023:  1. Left ventricular ejection fraction, by estimation, is 60 to 65%. The  left ventricle has normal function. The left ventricle has no regional  wall motion abnormalities. There is mild left ventricular hypertrophy.  Left ventricular diastolic parameters  are indeterminate.   2. Right ventricular systolic  function is mildly reduced. The right  ventricular size is normal. Tricuspid regurgitation signal is inadequate  for assessing PA pressure.   3. Left atrial size was mildly dilated.   4. The mitral valve is degenerative. Trivial mitral valve regurgitation.  No evidence of mitral stenosis.   5. The aortic valve is tricuspid. There is mild calcification of the  aortic valve. There is mild thickening of the aortic valve. Aortic valve   regurgitation is trivial. No aortic stenosis is present.   6. Aortic dilatation noted. There is borderline dilatation of the  ascending aorta, measuring 41 mm.   7. The inferior vena cava is normal in size with greater than 50%  respiratory variability, suggesting right atrial pressure of 3 mmHg.   Laboratory Data: High Sensitivity Troponin:   Recent Labs  Lab 03/15/24 1911 03/15/24 2106 03/20/24 1627 03/20/24 1837  TROPONINIHS 18* 17 25* 28*     Chemistry Recent Labs  Lab 03/18/24 0551 03/19/24 0546 03/20/24 1627 03/20/24 2230  NA 135 137 139  --   K 4.6 4.7 5.0  --   CL 106 108 107  --   CO2 20* 22 18*  --   GLUCOSE 130* 119* 125*  --   BUN 41* 50* 62*  --   CREATININE 2.57* 2.39* 2.66*  --   CALCIUM  7.9* 8.2* 8.7*  --   MG 2.3 2.3  --  2.5*  GFRNONAA 24* 27* 23*  --   ANIONGAP 9 7 14   --     Recent Labs  Lab 03/15/24 1911 03/17/24 0707 03/20/24 1627  PROT 6.4* 6.5 6.7  ALBUMIN  3.0* 2.9* 2.9*  AST 21 22 31   ALT 11 11 17   ALKPHOS 92 107 113  BILITOT 0.7 1.1 0.9   Lipids No results for input(s): "CHOL", "TRIG", "HDL", "LABVLDL", "LDLCALC", "CHOLHDL" in the last 168 hours.  Hematology Recent Labs  Lab 03/18/24 0551 03/19/24 0546 03/20/24 1627 03/20/24 2230  WBC 9.8 9.9 11.4*  --   RBC 3.28* 3.18* 3.23* 2.92*  HGB 9.1* 8.9* 9.2*  --   HCT 28.5* 27.9* 29.6*  --   MCV 86.9 87.7 91.6  --   MCH 27.7 28.0 28.5  --   MCHC 31.9 31.9 31.1  --   RDW 13.6 13.7 13.8  --   PLT 207 206 244  --    Thyroid   Recent Labs  Lab 03/20/24 2230  TSH 2.672  FREET4 1.26*    BNP Recent Labs  Lab 03/20/24 1627  BNP 537.5*    DDimer No results for input(s): "DDIMER" in the last 168 hours.  Radiology/Studies:  DG Chest Port 1 View Result Date: 03/20/2024 CLINICAL DATA:  sob EXAM: PORTABLE CHEST - 1 VIEW COMPARISON:  Mar 15, 2024 FINDINGS: Low lung volumes. Bilateral perihilar interstitial opacities. No focal airspace consolidation, pleural effusion, or  pneumothorax. Cardiomegaly. In planted cardiac loop recorder device. CABG markers and sternotomy wires. Tortuous aorta with aortic atherosclerosis. No acute fracture or destructive lesions. Multilevel thoracic osteophytosis. IMPRESSION: Cardiomegaly with findings of either interstitial edema or atypical/viral infection. Electronically Signed   By: Rance Burrows M.D.   On: 03/20/2024 17:06     Assessment and Plan:  Shortness of breath - CXR felt related to PNA, likely causing weakness - agree with ABX   Chronic diastolic heart failure Hx of mild RV dysfunction - pt was initially given IV lasix , now lasix  stopped - gentle IVF per primary - last echo with continued preserved LVEF, mild  LVH - has been compliant on home torsemide  dosing - he does not feel volume up, denies LE edema, orthopnea, and weight gain (although has only been home 1 day) - suspect his SOB primarily due to pulmonary illness rather than heart failure - recommend possibly restarting 20 mg torsemide  tomorrow, can titrate as he improves clinically   Chronic anticoagulation  - maintained on 2.5 mg eliquis  for age and renal function - no bleeding issues, fecal occult card negative   Persistent atrial fibrillation Bradycardia  - not on BB secondary to 2.5 sec pauses on telemetry during hospitalization 01/2023 - telemetry today with Afib and SVR at times in the 30s, seems asymptomatic, longest pause was 2.95 sec, other pauses less than 2.5 sec - he denies dizziness and pre-syncope - agree with no AV nodal agents   CAD s/p CABG x 4 (2020) - no recent ischemic evaluation - no ASA in the setting of eliquis  - BB discontinued secondary to pauses during 01/2023 hospitalization - no chest pain - continue statin   Hypertension - maintained on imdur , norvasc , olmesartan, and hydralazine  - hypertensive on my exam, but BP labile in chart - home medications have been held - typically on 100 mg hydralazine  TID, 5 mg  olmesartan, 90 mg imdur ,  - restart imdur , hold olmesartan for now, may need to ultimately reduce hydralazine  depending on BP trend   DM Recent hospitalization with hypoglycemia - BG have been normal to elevated here    Risk Assessment/Risk Scores:    CHA2DS2-VASc Score = 8   This indicates a 10.8% annual risk of stroke. The patient's score is based upon: CHF History: 1 HTN History: 1 Diabetes History: 1 Stroke History: 2 Vascular Disease History: 1 Age Score: 2 Gender Score: 0      For questions or updates, please contact [facility] Please consult [email or web address] for contact info under    Signed, Lamond Pilot, PA  03/21/2024 11:42 AM

## 2024-03-21 NOTE — Evaluation (Signed)
 Physical Therapy Evaluation Patient Details Name: Jake Gross MRN: [id number] DOB: [birth year] Today's Date: 03/21/2024  History of Present Illness  Patient is a 83 year old male with weakness, chest x-ray with bilateral infiltrates and leukocytosis. Recent hospital stay with hyperglycemia. PMH: CKD stage IV, HTN, HLD, DM2, HFpEF, CAD, s/p CABG, A-fib  Clinical Impression  Patient is agreeable to PT evaluation. He reports he has had weakness since recent discharge from the hospital. He lives alone but has family support. He has been using the rolling walker for ambulation.  Today the patient reports continued generalized weakness. He required assistance for bed mobility and standing. He performed standing and pre-gait activity with Min A using rolling walker but declined walking due to fatigue and weakness with minimal activity. His goal is to return home with family support. Anticipate initial assistance will be required from caregivers for safe return home. PT will continue to follow to maximize independence.       If plan is discharge home, recommend the following: A little help with walking and/or transfers;A little help with bathing/dressing/bathroom;Assist for transportation;Help with stairs or ramp for entrance;Assistance with cooking/housework   Can travel by private vehicle        Equipment Recommendations None recommended by PT  Recommendations for Other Services       Functional Status Assessment Patient has had a recent decline in their functional status and demonstrates the ability to make significant improvements in function in a reasonable and predictable amount of time.     Precautions / Restrictions Precautions Precautions: Fall Recall of Precautions/Restrictions: Intact Restrictions Weight Bearing Restrictions Per Provider Order: No      Mobility  Bed Mobility Overal bed mobility: Needs Assistance Bed Mobility: Sit to Supine, Supine to Sit     Supine to  sit: Mod assist, HOB elevated Sit to supine: Supervision, HOB elevated   General bed mobility comments: assistance for trunk support to sit upright    Transfers Overall transfer level: Needs assistance Equipment used: Rolling walker (2 wheels) Transfers: Sit to/from Stand Sit to Stand: Min assist   Step pivot transfers: Mod assist       General transfer comment: sit to stand from bed and step pivot to and from bed side commode    Ambulation/Gait         Gait velocity: patient declined, reporting he feels too weak to walk   Pre-gait activities: patient able to take 3 small side step with RW along edge of bed with Min A    Stairs            Wheelchair Mobility     Tilt Bed    Modified Rankin (Stroke Patients Only)       Balance Overall balance assessment: Needs assistance Sitting-balance support: No upper extremity supported Sitting balance-Leahy Scale: Fair     Standing balance support: Bilateral upper extremity supported Standing balance-Leahy Scale: Poor Standing balance comment: heavy reliance on rolling walker for support                             Pertinent Vitals/Pain Pain Assessment Pain Assessment: No/denies pain    Home Living Family/patient expects to be discharged to:: Private residence Living Arrangements: Alone Available Help at Discharge: Family;Available PRN/intermittently Type of Home: House Home Access: Stairs to enter Entrance Stairs-Rails: Doctor, general practice of Steps: 5   Home Layout: One level Home Equipment: Cane - single Librarian, academic (2 wheels)  Prior Function Prior Level of Function : Independent/Modified Independent             Mobility Comments: recently ambulating with rolling walker. reports difficulty with mobility since recent hospital discharge ADLs Comments: Reports independence with ADLs, IADLs, still driving up until recent hospital stay     Extremity/Trunk  Assessment   Upper Extremity Assessment Upper Extremity Assessment: Defer to OT evaluation;Generalized weakness    Lower Extremity Assessment Lower Extremity Assessment: Generalized weakness       Communication   Communication Communication: No apparent difficulties Factors Affecting Communication: Hearing impaired    Cognition Arousal: Alert Behavior During Therapy: WFL for tasks assessed/performed   PT - Cognitive impairments: No apparent impairments                         Following commands: Impaired Following commands impaired: Follows one step commands with increased time     Cueing Cueing Techniques: Verbal cues     General Comments General comments (skin integrity, edema, etc.): vitals stable on room air with no dyspnea with exertion. patient is fatigued with minimal activity    Exercises     Assessment/Plan    PT Assessment Patient needs continued PT services  PT Problem List Decreased strength;Decreased range of motion;Decreased activity tolerance;Decreased balance;Decreased mobility       PT Treatment Interventions DME instruction;Gait training;Stair training;Functional mobility training;Therapeutic exercise;Therapeutic activities;Balance training;Patient/family education    PT Goals (Current goals can be found in the Care Plan section)  Acute Rehab PT Goals Patient Stated Goal: to go home PT Goal Formulation: With patient Time For Goal Achievement: 04/04/24 Potential to Achieve Goals: Fair    Frequency Min 2X/week     Co-evaluation               AM-PAC PT "6 Clicks" Mobility  Outcome Measure Help needed turning from your back to your side while in a flat bed without using bedrails?: A Little Help needed moving from lying on your back to sitting on the side of a flat bed without using bedrails?: A Lot Help needed moving to and from a bed to a chair (including a wheelchair)?: A Little Help needed standing up from a chair using  your arms (e.g., wheelchair or bedside chair)?: A Little Help needed to walk in hospital room?: A Little Help needed climbing 3-5 steps with a railing? : A Lot 6 Click Score: 16    End of Session   Activity Tolerance: Patient limited by fatigue Patient left: in bed;with call bell/phone within reach Nurse Communication: Mobility status PT Visit Diagnosis: Unsteadiness on feet (R26.81);Muscle weakness (generalized) (M62.81)    Time: 2841-3244 PT Time Calculation (min) (ACUTE ONLY): 17 min   Charges:   PT Evaluation $PT Eval Moderate Complexity: 1 Mod   PT General Charges $$ ACUTE PT VISIT: 1 Visit         Ozie Bo, PT, MPT  Erlene Hawks 03/21/2024, 9:21 AM

## 2024-03-21 NOTE — Progress Notes (Signed)
 [facility] PROGRESS NOTE    Progress Note  Jake Gross  ZOX:[id number] DOB: 1941/01/06 DOA: 03/20/2024 PCP: Dorothe Gaster, NP     Brief Narrative:   Jake Gross is an 83 y.o. male past medical history of chronic kidney disease stage IV, essential hypertension, hyperlipidemia insulin -dependent diabetes mellitus type 2, HFpEF, coronary artery disease with CABG in 2020, chronic atrial fibrillation on Eliquis  who lives at home, independent with all his ADLs recently discharged from the hospital comes back today with weakness, chest x-ray showed bilateral infiltrates with a leukocytosis. Assessment/Plan:   Generalized weakness: Continue supportive measures. I am concerned that there is a component of aspiration he was recently discharged for hyperglycemia. PT OT evaluation.  Possible atypical pneumonia: Atypical versus aspiration more likely atypical due to the bilateral nature. Influenza, RSV and SARS-CoV-2 PCR negative. He relates no orthopnea short of breath at rest worse with ambulation With a leukocytosis with a left shift, check a procalcitonin. I agree with continuing antibiotics. Monitor saturations.  Normocytic anemia: Appears to be chronic hemoglobin is stable.  Paroxysmal atrial fibrillation: Currently rate controlled continue Eliquis .  Chronic HFpEF: Last 2D echo showed an EF of 60%, he received Lasix  in the ED. I will go ahead and discontinue this. BNP is elevated which is acting as an acute phase reactant. He appears euvolemic on physical exam. Will start on gentle IV fluids. He is not in acute decompensated diastolic dysfunction.  Stage 4 chronic kidney disease (HCC) He is not in acute kidney injury. His creatinine is at baseline 2.3-2.7.  Diabetes mellitus type 2: With an A1c of less than 5, DC all oral hypoglycemic agents and all insulins. Stop checking CBGs.  Normal anion gap metabolic acidosis: Unclear etiology, question due to his  chronic renal disease. Started on gentle IV fluids and recheck in the morning  DVT prophylaxis: Eliquis  Family Communication:none Status is: Inpatient Remains inpatient appropriate because: Possible pneumonia    Code Status:     Code Status Orders  (From admission, onward)           Start     Ordered   03/20/24 2222  Full code  Continuous       Question:  By:  Answer:  Other   03/20/24 2226           Code Status History     Date Active Date Inactive Code Status Order ID Comments User Context   03/16/2024 0257 03/19/2024 2048 Full Code [id number]  Bary Boss, DO ED   02/01/2023 2301 02/05/2023 1956 Full Code [id number]  Corrinne Din, MD ED   01/25/2023 0131 01/26/2023 2326 Full Code [id number]  Dea Evert, DO ED   09/28/2022 0203 10/03/2022 1940 Full Code [id number]  Walton Guppy, MD ED   08/27/2019 1832 08/30/2019 1852 Full Code [id number]  Veronica Gordon, MD ED   11/26/2018 1329 12/02/2018 1431 Full Code [id number]  Leata Providence, PA-C Inpatient   11/20/2018 1719 11/26/2018 1329 Full Code [id number]  Odie Benne, MD Inpatient   03/25/2018 1856 03/27/2018 1537 Full Code [id number]  Ephriam Hashimoto, MD ED   06/20/2017 0648 06/23/2017 1901 Full Code [id number]  Ronni Colace, NP Inpatient   04/11/2015 1931 04/14/2015 1750 Full Code [id number]  Oza Blumenthal, MD Inpatient   04/08/2015 1216 04/10/2015 1334 Full Code [id number]  Candyce Champagne, MD Inpatient         IV Access:   Peripheral IV   Procedures and diagnostic studies:  DG Chest Port 1 View Result Date: 03/20/2024 CLINICAL DATA:  sob EXAM: PORTABLE CHEST - 1 VIEW COMPARISON:  Mar 15, 2024 FINDINGS: Low lung volumes. Bilateral perihilar interstitial opacities. No focal airspace consolidation, pleural effusion, or pneumothorax. Cardiomegaly. In planted cardiac loop recorder device. CABG markers and sternotomy wires. Tortuous aorta with aortic atherosclerosis. No acute fracture or  destructive lesions. Multilevel thoracic osteophytosis. IMPRESSION: Cardiomegaly with findings of either interstitial edema or atypical/viral infection. Electronically Signed   By: Rance Burrows M.D.   On: 03/20/2024 17:06     Medical Consultants:   None.   Subjective:    [name] he relates he feels still short of breath and very weak.  Objective:    Vitals:   03/21/24 0211 03/21/24 0215 03/21/24 0400 03/21/24 0500  BP:  (!) 116/51 (!) 108/50 (!) 132/55  Pulse:  (!) 52 (!) 50 (!) 34  Resp:  17 (!) 22 (!) 22  Temp: 99.6 F (37.6 C)     TempSrc: Oral     SpO2:  98% 100% 98%   SpO2: 98 %   Intake/Output Summary (Last 24 hours) at 03/21/2024 2841 Last data filed at 03/21/2024 0043 Gross per 24 hour  Intake 100 ml  Output 300 ml  Net -200 ml   There were no vitals filed for this visit.  Exam: General exam: In no acute distress. Respiratory system: Good air movement and crackles at bases predominantly on the left Cardiovascular system: S1 & S2 heard, RRR.  Negative hepatojugular reflux Gastrointestinal system: Abdomen is nondistended, soft and nontender.  Extremities: No pedal edema. Skin: No rashes, lesions or ulcers Psychiatry: Judgement and insight appear normal. Mood & affect appropriate.    Data Reviewed:    Labs: Basic Metabolic Panel: Recent Labs  Lab 03/15/24 1911 03/15/24 2013 03/16/24 3244 03/17/24 0707 03/18/24 0551 03/19/24 0546 03/20/24 1627 03/20/24 2230  NA 140   < > 139 135 135 137 139  --   K 6.6*   < > 5.4* 5.0 4.6 4.7 5.0  --   CL 116*   < > 114* 108 106 108 107  --   CO2 16*   < > 16* 17* 20* 22 18*  --   GLUCOSE 80   < > 62* 83 130* 119* 125*  --   BUN 43*   < > 40* 38* 41* 50* 62*  --   CREATININE 2.77*   < > 2.56* 2.53* 2.57* 2.39* 2.66*  --   CALCIUM  8.4*   < > 8.6* 8.3* 7.9* 8.2* 8.7*  --   MG 2.4  --   --  2.3 2.3 2.3  --  2.5*  PHOS  --   --  5.1*  --   --   --   --   --    < > = values in this interval not  displayed.   GFR Estimated Creatinine Clearance: 23 mL/min (A) (by C-G formula based on SCr of 2.66 mg/dL (H)). Liver Function Tests: Recent Labs  Lab 03/15/24 1911 03/17/24 0707 03/20/24 1627  AST 21 22 31   ALT 11 11 17   ALKPHOS 92 107 113  BILITOT 0.7 1.1 0.9  PROT 6.4* 6.5 6.7  ALBUMIN  3.0* 2.9* 2.9*   No results for input(s): "LIPASE", "AMYLASE" in the last 168 hours. No results for input(s): "AMMONIA" in the last 168 hours. Coagulation profile No results for input(s): "INR", "PROTIME" in the last 168 hours. COVID-19 Labs  Recent Labs  03/20/24 2230  FERRITIN 86    Lab Results  Component Value Date   SARSCOV2NAA NEGATIVE 03/20/2024   SARSCOV2NAA NEGATIVE 02/01/2023   SARSCOV2NAA NEGATIVE 08/27/2019    CBC: Recent Labs  Lab 03/15/24 1911 03/16/24 0653 03/17/24 0707 03/18/24 0551 03/19/24 0546 03/20/24 1627  WBC 10.0 8.6 10.7* 9.8 9.9 11.4*  NEUTROABS 7.1  --  6.9 6.8 6.8 8.6*  HGB 9.1* 9.1* 10.3* 9.1* 8.9* 9.2*  HCT 29.6* 29.7* 33.2* 28.5* 27.9* 29.6*  MCV 92.5 93.4 90.5 86.9 87.7 91.6  PLT 245 211 264 207 206 244   Cardiac Enzymes: No results for input(s): "CKTOTAL", "CKMB", "CKMBINDEX", "TROPONINI" in the last 168 hours. BNP (last 3 results) No results for input(s): "PROBNP" in the last 8760 hours. CBG: Recent Labs  Lab 03/19/24 0519 03/19/24 0813 03/19/24 1255 03/20/24 2306 03/21/24 0402  GLUCAP 110* 153* 126* 119* 91   D-Dimer: No results for input(s): "DDIMER" in the last 72 hours. Hgb A1c: No results for input(s): "HGBA1C" in the last 72 hours. Lipid Profile: No results for input(s): "CHOL", "HDL", "LDLCALC", "TRIG", "CHOLHDL", "LDLDIRECT" in the last 72 hours. Thyroid  function studies: Recent Labs    03/20/24 2230  TSH 2.672   Anemia work up: Recent Labs    03/20/24 2230  VITAMINB12 151*  FOLATE 15.9  FERRITIN 86  TIBC 210*  IRON 15*  RETICCTPCT 2.5   Sepsis Labs: Recent Labs  Lab 03/17/24 0707 03/18/24 0551  03/19/24 0546 03/20/24 1627 03/20/24 2132 03/20/24 2230  PROCALCITON  --   --   --   --   --  0.30  WBC 10.7* 9.8 9.9 11.4*  --   --   LATICACIDVEN  --   --   --   --  0.7  --    Microbiology Recent Results (from the past 240 hours)  Resp panel by RT-PCR (RSV, Flu A&B, Covid) Anterior Nasal Swab     Status: None   Collection Time: 03/20/24  6:23 PM   Specimen: Anterior Nasal Swab  Result Value Ref Range Status   SARS Coronavirus 2 by RT PCR NEGATIVE NEGATIVE Final   Influenza A by PCR NEGATIVE NEGATIVE Final   Influenza B by PCR NEGATIVE NEGATIVE Final    Comment: (NOTE) The Xpert Xpress SARS-CoV-2/FLU/RSV plus assay is intended as an aid in the diagnosis of influenza from Nasopharyngeal swab specimens and should not be used as a sole basis for treatment. Nasal washings and aspirates are unacceptable for Xpert Xpress SARS-CoV-2/FLU/RSV testing.  Fact Sheet for Patients: BloggerCourse.com  Fact Sheet for Healthcare Providers: SeriousBroker.it  This test is not yet approved or cleared by the [place]  FDA and has been authorized for detection and/or diagnosis of SARS-CoV-2 by FDA under an Emergency Use Authorization (EUA). This EUA will remain in effect (meaning this test can be used) for the duration of the COVID-19 declaration under Section 564(b)(1) of the Act, 21 U.S.C. section 360bbb-3(b)(1), unless the authorization is terminated or revoked.     Resp Syncytial Virus by PCR NEGATIVE NEGATIVE Final    Comment: (NOTE) Fact Sheet for Patients: BloggerCourse.com  Fact Sheet for Healthcare Providers: SeriousBroker.it  This test is not yet approved or cleared by the [place]  FDA and has been authorized for detection and/or diagnosis of SARS-CoV-2 by FDA under an Emergency Use Authorization (EUA). This EUA will remain in effect (meaning this test can be used) for  the duration of the COVID-19 declaration under Section 564(b)(1) of the Act, 21 U.S.C.  section 360bbb-3(b)(1), unless the authorization is terminated or revoked.  Performed at [facility], [address] [place], [place] [address]      Medications:    apixaban   2.5 mg Oral BID   insulin  aspart  0-5 Units Subcutaneous QHS   insulin  aspart  0-6 Units Subcutaneous TID WC   pantoprazole  (PROTONIX ) IV  40 mg Intravenous Q12H   rosuvastatin   20 mg Oral Daily   sodium chloride  flush  3 mL Intravenous Q12H   Continuous Infusions:    LOS: 1 day   Macdonald Savoy  [facility]  03/21/2024, 6:14 AM

## 2024-03-21 NOTE — Evaluation (Signed)
 Occupational Therapy Evaluation Patient Details Name: Jake Gross MRN: [id number] DOB: [birth year] Today's Date: 03/21/2024   History of Present Illness   Patient is a 83 year old male with weakness, chest x-ray with bilateral infiltrates and leukocytosis. Recent hospital stay with hyperglycemia. PMH: CKD stage IV, HTN, HLD, DM2, HFpEF, CAD, s/p CABG, A-fib     Clinical Impressions Patient admitted for the diagnosis above.  PTA he lives at home alone, and beginning [facility] OT/PT after a recent hospitalization.  Patient is performing below his prior level of function, needing up to Mod A for simple transfers and lower body ADL.  OT will follow in the acute setting to address deficits, and [facility] OT is recommended.       If plan is discharge home, recommend the following:   A little help with walking and/or transfers;A little help with bathing/dressing/bathroom;Assistance with cooking/housework;Assist for transportation;Help with stairs or ramp for entrance     Functional Status Assessment   Patient has had a recent decline in their functional status and demonstrates the ability to make significant improvements in function in a reasonable and predictable amount of time.     Equipment Recommendations   BSC/3in1     Recommendations for Other Services         Precautions/Restrictions   Precautions Precautions: Fall Recall of Precautions/Restrictions: Intact Restrictions Weight Bearing Restrictions Per Provider Order: No     Mobility Bed Mobility Overal bed mobility: Needs Assistance Bed Mobility: Sit to Supine, Supine to Sit     Supine to sit: Mod assist, HOB elevated Sit to supine: Min assist        Transfers                          Balance Overall balance assessment: Needs assistance Sitting-balance support: Feet unsupported Sitting balance-Leahy Scale: Fair                                     ADL either performed or assessed  with clinical judgement   ADL   Eating/Feeding: Independent   Grooming: Set up;Sitting   Upper Body Bathing: Set up;Sitting   Lower Body Bathing: Moderate assistance;Sit to/from stand   Upper Body Dressing : Set up;Sitting   Lower Body Dressing: Moderate assistance;Sit to/from stand   Toilet Transfer: Moderate assistance;Stand-pivot;BSC/3in1                   Vision Patient Visual Report: No change from baseline       Perception Perception: Not tested       Praxis Praxis: Not tested       Pertinent Vitals/Pain Pain Assessment Pain Assessment: No/denies pain Pain Intervention(s): Monitored during session     Extremity/Trunk Assessment Upper Extremity Assessment Upper Extremity Assessment: Generalized weakness RUE Deficits / Details: Painful, but can still use RUE with basic ADLs   Lower Extremity Assessment Lower Extremity Assessment: Defer to PT evaluation       Communication Communication Communication: Impaired Factors Affecting Communication: Hearing impaired   Cognition Arousal: Alert Behavior During Therapy: WFL for tasks assessed/performed Cognition: No family/caregiver present to determine baseline             OT - Cognition Comments: mild memory deficits, but oriented with increased time and follows up to 2 step commands.  Following commands: Impaired Following commands impaired: Follows one step commands with increased time     Cueing  General Comments   Cueing Techniques: Verbal cues;Gestural cues  vitals stable on room air with no dyspnea with exertion. patient is fatigued with minimal activity   Exercises     Shoulder Instructions      Home Living Family/patient expects to be discharged to:: Private residence Living Arrangements: Alone Available Help at Discharge: Family;Available PRN/intermittently Type of Home: House Home Access: Stairs to enter [employer] of Steps: 5 Entrance  Stairs-Rails: Right;Left Home Layout: One level     Bathroom Shower/Tub: Chief Strategy Officer: Handicapped height Bathroom Accessibility: Yes   Home Equipment: Cane - single point;Rolling Walker (2 wheels)   Additional Comments: reports he is still driving      Prior Functioning/Environment Prior Level of Function : Independent/Modified Independent             Mobility Comments: recently ambulating with rolling walker. reports difficulty with mobility since recent hospital discharge ADLs Comments: Reports independence with ADLs, IADLs, still driving up until recent hospital stay    OT Problem List: Decreased strength;Decreased activity tolerance;Impaired balance (sitting and/or standing);Decreased safety awareness   OT Treatment/Interventions: Self-care/ADL training;Therapeutic exercise;DME and/or AE instruction;Therapeutic activities;Patient/family education;Balance training      OT Goals(Current goals can be found in the care plan section)   Acute Rehab OT Goals Patient Stated Goal: Return home OT Goal Formulation: With patient Time For Goal Achievement: 04/04/24 Potential to Achieve Goals: Good ADL Goals Pt Will Perform Grooming: with supervision;standing Pt Will Perform Lower Body Dressing: with supervision;sit to/from stand Pt Will Transfer to Toilet: with supervision;ambulating;regular height toilet   OT Frequency:  Min 2X/week    Co-evaluation              AM-PAC OT "6 Clicks" Daily Activity     Outcome Measure Help from another person eating meals?: None Help from another person taking care of personal grooming?: A Little Help from another person toileting, which includes using toliet, bedpan, or urinal?: A Lot Help from another person bathing (including washing, rinsing, drying)?: A Lot Help from another person to put on and taking off regular upper body clothing?: A Little Help from another person to put on and taking off regular  lower body clothing?: A Lot 6 Click Score: 16   End of Session Equipment Utilized During Treatment: Rolling walker (2 wheels) Nurse Communication: Mobility status  Activity Tolerance: Patient tolerated treatment well Patient left: in bed;with call bell/phone within reach;with bed alarm set  OT Visit Diagnosis: Unsteadiness on feet (R26.81);Muscle weakness (generalized) (M62.81)                Time: 3086-5784 OT Time Calculation (min): 21 min Charges:  OT General Charges $OT Visit: 1 Visit OT Evaluation $OT Eval Moderate Complexity: 1 Mod  03/21/2024  RP, OTR/L  Acute Rehabilitation Services  Office:  [phone]   Jake Gross 03/21/2024, 11:48 AM

## 2024-03-21 NOTE — ED Notes (Addendum)
CBG 91 

## 2024-03-21 NOTE — Progress Notes (Signed)
 Heart Failure Navigator Progress Note  Assessed for Heart & Vascular TOC clinic readiness.  Patient does not meet criteria due to .- more chronic CHF EF 60%,  No HF TOC.   Navigator will sign off at this time.   Randie Bustle, BSN, Scientist, clinical (histocompatibility and immunogenetics) Only

## 2024-03-21 NOTE — ED Notes (Signed)
 RN and ED PA and MD made aware of Pt's HR.

## 2024-03-22 ENCOUNTER — Inpatient Hospital Stay (HOSPITAL_COMMUNITY)

## 2024-03-22 DIAGNOSIS — I5031 Acute diastolic (congestive) heart failure: Secondary | ICD-10-CM | POA: Diagnosis not present

## 2024-03-22 DIAGNOSIS — J189 Pneumonia, unspecified organism: Principal | ICD-10-CM

## 2024-03-22 DIAGNOSIS — I480 Paroxysmal atrial fibrillation: Secondary | ICD-10-CM | POA: Diagnosis not present

## 2024-03-22 DIAGNOSIS — I5032 Chronic diastolic (congestive) heart failure: Secondary | ICD-10-CM

## 2024-03-22 DIAGNOSIS — I482 Chronic atrial fibrillation, unspecified: Secondary | ICD-10-CM

## 2024-03-22 DIAGNOSIS — R531 Weakness: Secondary | ICD-10-CM | POA: Diagnosis not present

## 2024-03-22 LAB — BASIC METABOLIC PANEL WITH GFR
Anion gap: 10 (ref 5–15)
BUN: 74 mg/dL — ABNORMAL HIGH (ref 8–23)
CO2: 21 mmol/L — ABNORMAL LOW (ref 22–32)
Calcium: 8.7 mg/dL — ABNORMAL LOW (ref 8.9–10.3)
Chloride: 108 mmol/L (ref 98–111)
Creatinine, Ser: 3.05 mg/dL — ABNORMAL HIGH (ref 0.61–1.24)
GFR, Estimated: 20 mL/min — ABNORMAL LOW (ref >60)
Glucose, Bld: 133 mg/dL — ABNORMAL HIGH (ref 70–99)
Potassium: 4.3 mmol/L (ref 3.5–5.1)
Sodium: 139 mmol/L (ref 135–145)

## 2024-03-22 LAB — CBC WITH DIFFERENTIAL/PLATELET
Abs Immature Granulocytes: 0.04 10*3/uL (ref 0.00–0.07)
Basophils Absolute: 0.1 10*3/uL (ref 0.0–0.1)
Basophils Relative: 1 %
Eosinophils Absolute: 0.2 10*3/uL (ref 0.0–0.5)
Eosinophils Relative: 3 %
HCT: 29.8 % — ABNORMAL LOW (ref 39.0–52.0)
Hemoglobin: 9.5 g/dL — ABNORMAL LOW (ref 13.0–17.0)
Immature Granulocytes: 1 %
Lymphocytes Relative: 18 %
Lymphs Abs: 1.6 10*3/uL (ref 0.7–4.0)
MCH: 28.1 pg (ref 26.0–34.0)
MCHC: 31.9 g/dL (ref 30.0–36.0)
MCV: 88.2 fL (ref 80.0–100.0)
Monocytes Absolute: 1 10*3/uL (ref 0.1–1.0)
Monocytes Relative: 12 %
Neutro Abs: 5.9 10*3/uL (ref 1.7–7.7)
Neutrophils Relative %: 65 %
Platelets: 266 10*3/uL (ref 150–400)
RBC: 3.38 MIL/uL — ABNORMAL LOW (ref 4.22–5.81)
RDW: 13.5 % (ref 11.5–15.5)
WBC: 8.8 10*3/uL (ref 4.0–10.5)
nRBC: 0 % (ref 0.0–0.2)

## 2024-03-22 LAB — ECHOCARDIOGRAM COMPLETE
AR max vel: 1.48 cm2
AV Area VTI: 1.52 cm2
AV Area mean vel: 1.35 cm2
AV Mean grad: 8 mmHg
AV Peak grad: 13.8 mmHg
Ao pk vel: 1.86 m/s
Calc EF: 62.9 %
Height: 67 in
S' Lateral: 3.1 cm
Single Plane A2C EF: 66.4 %
Single Plane A4C EF: 59.6 %
Weight: 2921.6 [oz_av]

## 2024-03-22 LAB — GLUCOSE, CAPILLARY: Glucose-Capillary: 102 mg/dL — ABNORMAL HIGH (ref 70–99)

## 2024-03-22 MED ORDER — SODIUM CHLORIDE 0.9 % IV SOLN
INTRAVENOUS | Status: DC
  Administered 2024-03-22: 75 mL via INTRAVENOUS

## 2024-03-22 MED ORDER — ENSURE PLUS HIGH PROTEIN PO LIQD: 237.0000 mL | Freq: Two times a day (BID) | ORAL | Status: DC

## 2024-03-22 MED ORDER — PERFLUTREN LIPID MICROSPHERE
1.0000 mL | INTRAVENOUS | Status: AC | PRN
  Administered 2024-03-22: 3 mL via INTRAVENOUS

## 2024-03-22 NOTE — Progress Notes (Signed)
 Physical Therapy Treatment Patient Details Name: Jake Gross MRN: [id number] DOB: [birth year] Today's Date: 03/22/2024   History of Present Illness Patient is a 83 year old male with weakness, chest x-ray with bilateral infiltrates and leukocytosis. Recent hospital stay with hyperglycemia. PMH: CKD stage IV, HTN, HLD, DM2, HFpEF, CAD, s/p CABG, A-fib    PT Comments  Pt greeted supine in bed, pleasant and agreeable to PT session. He engaged in gait training using RW. Pt ambulated ~453ft with CGA. He demonstrated multiple gait abnormalities, but was receptive to VC/TC to correct his technique. Patient needs to practice stairs next session. Will continue to follow acutely and advance appropriately.     If plan is discharge home, recommend the following: A little help with walking and/or transfers;A little help with bathing/dressing/bathroom;Assist for transportation;Help with stairs or ramp for entrance;Assistance with cooking/housework   Can travel by private vehicle        Equipment Recommendations  None recommended by PT    Recommendations for Other Services       Precautions / Restrictions Precautions Precautions: Fall Recall of Precautions/Restrictions: Intact Restrictions Weight Bearing Restrictions Per Provider Order: No     Mobility  Bed Mobility Overal bed mobility: Needs Assistance Bed Mobility: Supine to Sit, Sit to Supine     Supine to sit: HOB elevated, Min assist Sit to supine: Supervision   General bed mobility comments: Pt sat up on R side of bed with increased time. He brought BLE off EOB and required minA to elevate trunk. Pt scooted fwd til feet flat with BUE support.    Transfers Overall transfer level: Needs assistance Equipment used: Rolling walker (2 wheels) Transfers: Sit to/from Stand Sit to Stand: Contact guard assist           General transfer comment: Pt stood from lowest bed height. He demonstrated proper hand placement using RW. Good  eccentric control with sitting.    Ambulation/Gait Ambulation/Gait assistance: Contact guard assist Gait Distance (Feet): 400 Feet Assistive device: Rolling walker (2 wheels) Gait Pattern/deviations: Step-through pattern, Trunk flexed, Decreased stride length, Narrow base of support, Knee flexed in stance - right, Knee flexed in stance - left Gait velocity: pt reports he is at his baseline speed Gait velocity interpretation: <1.8 ft/sec, indicate of risk for recurrent falls   General Gait Details: Pt ambulated with a reciprocal gait pattern. He maintained a fwd flex posture with AD too far away from him. Cues for upright posture and RW proximity. He was able to correct and maintain body inside AD. Pt kept BLE in slight knee flex and as he fatigued it became more laborious for him to advance his LE. No LOB.   Stairs             Wheelchair Mobility     Tilt Bed    Modified Rankin (Stroke Patients Only)       Balance Overall balance assessment: Needs assistance Sitting-balance support: Feet unsupported Sitting balance-Leahy Scale: Fair Sitting balance - Comments: Pt sat EOB with supervision.   Standing balance support: Bilateral upper extremity supported, During functional activity, Reliant on assistive device for balance Standing balance-Leahy Scale: Poor Standing balance comment: Pt dependent on RW.                            Communication Communication Communication: Impaired Factors Affecting Communication: Hearing impaired  Cognition Arousal: Alert Behavior During Therapy: Flat affect   PT - Cognitive impairments: No  apparent impairments                         Following commands: Intact      Cueing Cueing Techniques: Verbal cues  Exercises      General Comments General comments (skin integrity, edema, etc.): VSS on RA      Pertinent Vitals/Pain Pain Assessment Pain Assessment: No/denies pain    Home Living                           Prior Function            PT Goals (current goals can now be found in the care plan section) Acute Rehab PT Goals Patient Stated Goal: Return Home Progress towards PT goals: Progressing toward goals    Frequency    Min 2X/week      PT Plan      Co-evaluation              AM-PAC PT "6 Clicks" Mobility   Outcome Measure  Help needed turning from your back to your side while in a flat bed without using bedrails?: A Little Help needed moving from lying on your back to sitting on the side of a flat bed without using bedrails?: A Little Help needed moving to and from a bed to a chair (including a wheelchair)?: A Little Help needed standing up from a chair using your arms (e.g., wheelchair or bedside chair)?: A Little Help needed to walk in hospital room?: A Little Help needed climbing 3-5 steps with a railing? : A Lot 6 Click Score: 17    End of Session Equipment Utilized During Treatment: Gait belt Activity Tolerance: Patient tolerated treatment well Patient left: in bed;with call bell/phone within reach Nurse Communication: Mobility status PT Visit Diagnosis: Unsteadiness on feet (R26.81);Muscle weakness (generalized) (M62.81)     Time: 1350-1410 PT Time Calculation (min) (ACUTE ONLY): 20 min  Charges:    $Gait Training: 8-22 mins PT General Charges $$ ACUTE PT VISIT: 1 Visit                     Glenford Lanes, PT, DPT Acute Rehabilitation Services Office: [phone] Secure Chat Preferred  Riva Chester 03/22/2024, 2:30 PM

## 2024-03-22 NOTE — Progress Notes (Signed)
 Echocardiogram 2D Echocardiogram has been performed.  Aimi Essner N Teylor Wolven,RDCS 03/22/2024, 1:17 PM

## 2024-03-22 NOTE — Progress Notes (Addendum)
 Initial Nutrition Assessment  DOCUMENTATION CODES:   Not applicable  INTERVENTION:  Liberalize diet to regular diet will leave thickened liquids until provider gives ok  Encourage good PO intake Ordering assistance Ensure Plus High Protein po BID, each supplement provides 350 kcal and 20 grams of protein. If aspiration is thought to be related to swallowing dysfunction, recommend SLP consult   NUTRITION DIAGNOSIS:   Increased nutrient needs related to acute illness as evidenced by estimated needs.  GOAL:   Patient will meet greater than or equal to 90% of their needs  MONITOR:   PO intake, Supplement acceptance, Labs  REASON FOR ASSESSMENT:  Consult Assessment of nutrition requirement/status  ASSESSMENT:  Pt with hx of HTN, HLD, DM type 2, CHF, PAF, hx CVA, CAD s/p CABG x4, CKD4 presented to ED with SOB and weakness at home after being discharged the day prior.  Cardiology consulted and felt pt's imaging and SOB are more consistent with PNA related to aspiration verses cardiac in nature.   RD working remotely  Attempted to call pt on room phone, no answer at this time. Noted pt on nectar thick liquids currently for unknown reason. Noted pt on thins during previous admission and SLP has not recommended thickened liquids. Discussed with RN, states that pt has thin liquids at bedside. Thickened liquids was likely entered in error. If aspiration is suspected to be related to swallowing, SLP consult would be beneficial to ensure that proper consistency is in place for pt.   Reached out to provider for ability to change back to thin liquids  Reviewed recorded intake so far this admission and recent weight trends. Both appear overall stable at this time.   Will liberalize diet and add nutrition supplements to augment intake.   Admit weight: 88.7 kg ? accuracy Current weight: 82.8 kg   Average Meal Intake: 6/6: 80% intake x 1 recorded meals  Nutritionally Relevant  Medications: Scheduled Meds:  doxycycline   100 mg Oral BID   pantoprazole   40 mg Oral BID   rosuvastatin   20 mg Oral Daily   Continuous Infusions:  cefTRIAXone  (ROCEPHIN )  IV Stopped (03/21/24 2243)   PRN Meds: polyethylene glycol  Labs Reviewed: CBG ranges from 91-151 mg/dL over the last 24 hours HgbA1c 4.9% (6/1)  NUTRITION - FOCUSED PHYSICAL EXAM: Defer to in-person assessment  Diet Order:   Diet Order             Diet Heart Room service appropriate? Yes with Assist; Fluid consistency: Nectar Thick; Fluid restriction: 1500 mL Fluid  Diet effective now                   EDUCATION NEEDS:  No education needs have been identified at this time  Skin:  Skin Assessment: Reviewed RN Assessment  Last BM:  unsure  Height:  Ht Readings from Last 1 Encounters:  03/21/24 5\' 7"  (1.702 m)    Weight:  Wt Readings from Last 1 Encounters:  03/22/24 82.8 kg    Ideal Body Weight:  67.3 kg  BMI:  Body mass index is 28.6 kg/m.  Estimated Nutritional Needs:  Kcal:  1600-1800 kcal/d Protein:  80-95g/d Fluid:  1.8L/d    Edwena Graham, RD, LDN Registered Dietitian II Please reach out via secure chat

## 2024-03-22 NOTE — Plan of Care (Signed)
   Problem: Education: Goal: Ability to describe self-care measures that may prevent or decrease complications (Diabetes Survival Skills Education) will improve Outcome: Progressing Goal: Individualized Educational Video(s) Outcome: Progressing

## 2024-03-22 NOTE — Progress Notes (Signed)
 [facility] PROGRESS NOTE    Progress Note  Jake Gross  QMV:[id number] DOB: 08-09-1941 DOA: 03/20/2024 PCP: Dorothe Gaster, NP     Brief Narrative:   Jake Gross is an 83 y.o. male past medical history of chronic kidney disease stage IV, essential hypertension, hyperlipidemia insulin -dependent diabetes mellitus type 2, HFpEF, coronary artery disease with CABG in 2020, chronic atrial fibrillation on Eliquis  who lives at home, independent with all his ADLs recently discharged from the hospital comes back today with weakness, chest x-ray showed bilateral infiltrates with a leukocytosis. Assessment/Plan:   Generalized weakness: Continue supportive measures. PT OT evaluation will need home health PT.  Possible atypical pneumonia: Atypical versus aspiration more likely atypical due to the bilateral nature. Continue empiric IV Rocephin  and azithromycin procalcitonin is low yield. CBC with differential is pending this morning saturations remained greater than 90% on room air.  Normocytic anemia: Appears to be chronic hemoglobin is stable.  Paroxysmal atrial fibrillation: Currently rate controlled continue Eliquis . With a slow rate asymptomatic, related although blocking agents.  Chronic HFpEF: Last 2D echo showed an EF of 60%, he received Lasix  in the ED. He appears euvolemic on physical exam.  Stage 4 chronic kidney disease (HCC) He is not in acute kidney injury. His creatinine is at baseline 2.3-2.7.  Diabetes mellitus type 2: With an A1c of less than 5, DC all oral hypoglycemic agents and all insulins. Stop checking CBGs.  Normal anion gap metabolic acidosis: Unclear etiology, question due to his chronic renal disease. Started on gentle IV fluids and recheck in the morning  DVT prophylaxis: Eliquis  Family Communication:none Status is: Inpatient Remains inpatient appropriate because: Possible pneumonia    Code Status:     Code Status Orders  (From  admission, onward)           Start     Ordered   03/20/24 2222  Full code  Continuous       Question:  By:  Answer:  Other   03/20/24 2226           Code Status History     Date Active Date Inactive Code Status Order ID Comments User Context   03/16/2024 0257 03/19/2024 2048 Full Code [id number]  Bary Boss, DO ED   02/01/2023 2301 02/05/2023 1956 Full Code [id number]  Corrinne Din, MD ED   01/25/2023 0131 01/26/2023 2326 Full Code [id number]  Dea Evert, DO ED   09/28/2022 0203 10/03/2022 1940 Full Code [id number]  Walton Guppy, MD ED   08/27/2019 1832 08/30/2019 1852 Full Code [id number]  Veronica Gordon, MD ED   11/26/2018 1329 12/02/2018 1431 Full Code [id number]  Leata Providence, PA-C Inpatient   11/20/2018 1719 11/26/2018 1329 Full Code [id number]  Odie Benne, MD Inpatient   03/25/2018 1856 03/27/2018 1537 Full Code [id number]  Ephriam Hashimoto, MD ED   06/20/2017 0648 06/23/2017 1901 Full Code [id number]  Ronni Colace, NP Inpatient   04/11/2015 1931 04/14/2015 1750 Full Code [id number]  Oza Blumenthal, MD Inpatient   04/08/2015 1216 04/10/2015 1334 Full Code [id number]  Candyce Champagne, MD Inpatient         IV Access:   Peripheral IV   Procedures and diagnostic studies:   DG Chest Port 1 View Result Date: 03/20/2024 CLINICAL DATA:  sob EXAM: PORTABLE CHEST - 1 VIEW COMPARISON:  Mar 15, 2024 FINDINGS: Low lung volumes. Bilateral perihilar interstitial opacities. No focal airspace consolidation, pleural effusion, or pneumothorax. Cardiomegaly. In planted  cardiac loop recorder device. CABG markers and sternotomy wires. Tortuous aorta with aortic atherosclerosis. No acute fracture or destructive lesions. Multilevel thoracic osteophytosis. IMPRESSION: Cardiomegaly with findings of either interstitial edema or atypical/viral infection. Electronically Signed   By: Rance Burrows M.D.   On: 03/20/2024 17:06     Medical Consultants:    None.   Subjective:    Robinson S Hunkins relates breathing is significantly better this morning.  Objective:    Vitals:   03/21/24 1850 03/21/24 2125 03/22/24 0035 03/22/24 0504  BP:  136/64 123/62 (!) 140/67  Pulse:  (!) 40 (!) 56 (!) 58  Resp:  18 18 18   Temp:  98.3 F (36.8 C) 98.1 F (36.7 C) 98.1 F (36.7 C)  TempSrc:  Oral Oral Oral  SpO2:  96% 94% 98%  Weight:    82.8 kg  Height: 5\' 7"  (1.702 m)      SpO2: 98 %   Intake/Output Summary (Last 24 hours) at 03/22/2024 0803 Last data filed at 03/22/2024 0349 Gross per 24 hour  Intake 580 ml  Output 500 ml  Net 80 ml   Filed Weights   03/21/24 1600 03/22/24 0504  Weight: 88.7 kg 82.8 kg    Exam: General exam: In no acute distress. Respiratory system: Good air movement and crackles at bases bilaterally Cardiovascular system: S1 & S2 heard, RRR. No JVD. Gastrointestinal system: Abdomen is nondistended, soft and nontender.  Extremities: No pedal edema. Skin: No rashes, lesions or ulcers Psychiatry: Judgement and insight appear normal. Mood & affect appropriate.  Data Reviewed:    Labs: Basic Metabolic Panel: Recent Labs  Lab 03/15/24 1911 03/15/24 2013 03/16/24 1610 03/17/24 0707 03/18/24 0551 03/19/24 0546 03/20/24 1627 03/20/24 2230  NA 140   < > 139 135 135 137 139  --   K 6.6*   < > 5.4* 5.0 4.6 4.7 5.0  --   CL 116*   < > 114* 108 106 108 107  --   CO2 16*   < > 16* 17* 20* 22 18*  --   GLUCOSE 80   < > 62* 83 130* 119* 125*  --   BUN 43*   < > 40* 38* 41* 50* 62*  --   CREATININE 2.77*   < > 2.56* 2.53* 2.57* 2.39* 2.66*  --   CALCIUM  8.4*   < > 8.6* 8.3* 7.9* 8.2* 8.7*  --   MG 2.4  --   --  2.3 2.3 2.3  --  2.5*  PHOS  --   --  5.1*  --   --   --   --   --    < > = values in this interval not displayed.   GFR Estimated Creatinine Clearance: 22.4 mL/min (A) (by C-G formula based on SCr of 2.66 mg/dL (H)). Liver Function Tests: Recent Labs  Lab 03/15/24 1911 03/17/24 0707  03/20/24 1627  AST 21 22 31   ALT 11 11 17   ALKPHOS 92 107 113  BILITOT 0.7 1.1 0.9  PROT 6.4* 6.5 6.7  ALBUMIN  3.0* 2.9* 2.9*   No results for input(s): "LIPASE", "AMYLASE" in the last 168 hours. No results for input(s): "AMMONIA" in the last 168 hours. Coagulation profile No results for input(s): "INR", "PROTIME" in the last 168 hours. COVID-19 Labs  Recent Labs    03/20/24 2230  FERRITIN 86    Lab Results  Component Value Date   SARSCOV2NAA NEGATIVE 03/20/2024   SARSCOV2NAA NEGATIVE 02/01/2023  SARSCOV2NAA NEGATIVE 08/27/2019    CBC: Recent Labs  Lab 03/15/24 1911 03/16/24 0653 03/17/24 0707 03/18/24 0551 03/19/24 0546 03/20/24 1627  WBC 10.0 8.6 10.7* 9.8 9.9 11.4*  NEUTROABS 7.1  --  6.9 6.8 6.8 8.6*  HGB 9.1* 9.1* 10.3* 9.1* 8.9* 9.2*  HCT 29.6* 29.7* 33.2* 28.5* 27.9* 29.6*  MCV 92.5 93.4 90.5 86.9 87.7 91.6  PLT 245 211 264 207 206 244   Cardiac Enzymes: No results for input(s): "CKTOTAL", "CKMB", "CKMBINDEX", "TROPONINI" in the last 168 hours. BNP (last 3 results) No results for input(s): "PROBNP" in the last 8760 hours. CBG: Recent Labs  Lab 03/21/24 0402 03/21/24 1713 03/21/24 2052 03/21/24 2124 03/22/24 0622  GLUCAP 91 122* 135* 151* 102*   D-Dimer: No results for input(s): "DDIMER" in the last 72 hours. Hgb A1c: No results for input(s): "HGBA1C" in the last 72 hours. Lipid Profile: No results for input(s): "CHOL", "HDL", "LDLCALC", "TRIG", "CHOLHDL", "LDLDIRECT" in the last 72 hours. Thyroid  function studies: Recent Labs    03/20/24 2230  TSH 2.672   Anemia work up: Recent Labs    03/20/24 2230  VITAMINB12 151*  FOLATE 15.9  FERRITIN 86  TIBC 210*  IRON 15*  RETICCTPCT 2.5   Sepsis Labs: Recent Labs  Lab 03/17/24 0707 03/18/24 0551 03/19/24 0546 03/20/24 1627 03/20/24 2132 03/20/24 2230  PROCALCITON  --   --   --   --   --  0.30  WBC 10.7* 9.8 9.9 11.4*  --   --   LATICACIDVEN  --   --   --   --  0.7  --     Microbiology Recent Results (from the past 240 hours)  Resp panel by RT-PCR (RSV, Flu A&B, Covid) Anterior Nasal Swab     Status: None   Collection Time: 03/20/24  6:23 PM   Specimen: Anterior Nasal Swab  Result Value Ref Range Status   SARS Coronavirus 2 by RT PCR NEGATIVE NEGATIVE Final   Influenza A by PCR NEGATIVE NEGATIVE Final   Influenza B by PCR NEGATIVE NEGATIVE Final    Comment: (NOTE) The Xpert Xpress SARS-CoV-2/FLU/RSV plus assay is intended as an aid in the diagnosis of influenza from Nasopharyngeal swab specimens and should not be used as a sole basis for treatment. Nasal washings and aspirates are unacceptable for Xpert Xpress SARS-CoV-2/FLU/RSV testing.  Fact Sheet for Patients: BloggerCourse.com  Fact Sheet for Healthcare Providers: SeriousBroker.it  This test is not yet approved or cleared by the [place]  FDA and has been authorized for detection and/or diagnosis of SARS-CoV-2 by FDA under an Emergency Use Authorization (EUA). This EUA will remain in effect (meaning this test can be used) for the duration of the COVID-19 declaration under Section 564(b)(1) of the Act, 21 U.S.C. section 360bbb-3(b)(1), unless the authorization is terminated or revoked.     Resp Syncytial Virus by PCR NEGATIVE NEGATIVE Final    Comment: (NOTE) Fact Sheet for Patients: BloggerCourse.com  Fact Sheet for Healthcare Providers: SeriousBroker.it  This test is not yet approved or cleared by the [place]  FDA and has been authorized for detection and/or diagnosis of SARS-CoV-2 by FDA under an Emergency Use Authorization (EUA). This EUA will remain in effect (meaning this test can be used) for the duration of the COVID-19 declaration under Section 564(b)(1) of the Act, 21 U.S.C. section 360bbb-3(b)(1), unless the authorization is terminated or revoked.  Performed at  [facility], [address], [place], [place] [address]  Medications:    apixaban   2.5 mg Oral BID   doxycycline   100 mg Oral BID   pantoprazole   40 mg Oral BID   rosuvastatin   20 mg Oral Daily   sodium chloride  flush  3 mL Intravenous Q12H   Continuous Infusions:  cefTRIAXone  (ROCEPHIN )  IV Stopped (03/21/24 2243)      LOS: 2 days   Macdonald Savoy  [facility]  03/22/2024, 8:03 AM

## 2024-03-22 NOTE — Progress Notes (Signed)
 Cardiologist:  Jake Gross  Subjective:   No chest pain or pre syncope   Objective:  Vitals:   03/21/24 2125 03/22/24 0035 03/22/24 0504 03/22/24 0820  BP: 136/64 123/62 (!) 140/67 (!) 144/68  Pulse: (!) 40 (!) 56 (!) 58 (!) 59  Resp: 18 18 18 18   Temp: 98.3 F (36.8 C) 98.1 F (36.7 C) 98.1 F (36.7 C) 98 F (36.7 C)  TempSrc: Oral Oral Oral Oral  SpO2: 96% 94% 98% 97%  Weight:   82.8 kg   Height:        Intake/Output from previous day:  Intake/Output Summary (Last 24 hours) at 03/22/2024 0931 Last data filed at 03/22/2024 0349 Gross per 24 hour  Intake 580 ml  Output 500 ml  Net 80 ml    Physical Exam:  Chronically ill male No active wheezing No murmur prior sternotomy Abdomen benign No edema skin turgor dry  Lab Results: Basic Metabolic Panel: Recent Labs    03/20/24 1627 03/20/24 2230  NA 139  --   K 5.0  --   CL 107  --   CO2 18*  --   GLUCOSE 125*  --   BUN 62*  --   CREATININE 2.66*  --   CALCIUM  8.7*  --   MG  --  2.5*   Liver Function Tests: Recent Labs    03/20/24 1627  AST 31  ALT 17  ALKPHOS 113  BILITOT 0.9  PROT 6.7  ALBUMIN  2.9*   No results for input(s): "LIPASE", "AMYLASE" in the last 72 hours. CBC: Recent Labs    03/20/24 1627 03/22/24 0815  WBC 11.4* 8.8  NEUTROABS 8.6* 5.9  HGB 9.2* 9.5*  HCT 29.6* 29.8*  MCV 91.6 88.2  PLT 244 266    Thyroid  Function Tests: Recent Labs    03/20/24 2230  TSH 2.672   Anemia Panel: Recent Labs    03/20/24 2230  VITAMINB12 151*  FOLATE 15.9  FERRITIN 86  TIBC 210*  IRON 15*  RETICCTPCT 2.5    Imaging: DG Chest Port 1 View Result Date: 03/20/2024 CLINICAL DATA:  sob EXAM: PORTABLE CHEST - 1 VIEW COMPARISON:  Mar 15, 2024 FINDINGS: Low lung volumes. Bilateral perihilar interstitial opacities. No focal airspace consolidation, pleural effusion, or pneumothorax. Cardiomegaly. In planted cardiac loop recorder device. CABG markers and sternotomy wires. Tortuous aorta with  aortic atherosclerosis. No acute fracture or destructive lesions. Multilevel thoracic osteophytosis. IMPRESSION: Cardiomegaly with findings of either interstitial edema or atypical/viral infection. Electronically Signed   By: Rance Burrows M.D.   On: 03/20/2024 17:06    Cardiac Studies:  ECG: afib rate 63 bpm no acute changes    Telemetry:  afib 50-70's   Echo: 02/04/23 EF 60-65%   Medications:    apixaban   2.5 mg Oral BID   doxycycline   100 mg Oral BID   feeding supplement  237 mL Oral BID BM   pantoprazole   40 mg Oral BID   rosuvastatin   20 mg Oral Daily   sodium chloride  flush  3 mL Intravenous Q12H      cefTRIAXone  (ROCEPHIN )  IV Stopped (03/21/24 2243)    Assessment/Plan:   Afib:  known slow rates on no AV nodal drug. No long symptomatic pauses no indication for PPM Diastolic CHF:  No further diuretics he appears dry in setting of pneumonia can consider adding demedex back to his meds in 48 hours  update TTE EF was normal with no significant valve dx 02/04/23 Pneumonia:  on rocephin  and doxycycline  respiratory panel negative   Jake Gross 03/22/2024, 9:31 AM

## 2024-03-23 ENCOUNTER — Other Ambulatory Visit: Payer: Self-pay | Admitting: Nurse Practitioner

## 2024-03-23 ENCOUNTER — Other Ambulatory Visit: Payer: Self-pay | Admitting: Physician Assistant

## 2024-03-23 DIAGNOSIS — E119 Type 2 diabetes mellitus without complications: Secondary | ICD-10-CM

## 2024-03-23 DIAGNOSIS — J189 Pneumonia, unspecified organism: Secondary | ICD-10-CM | POA: Diagnosis not present

## 2024-03-23 DIAGNOSIS — E782 Mixed hyperlipidemia: Secondary | ICD-10-CM

## 2024-03-23 DIAGNOSIS — Z794 Long term (current) use of insulin: Secondary | ICD-10-CM

## 2024-03-23 DIAGNOSIS — R531 Weakness: Secondary | ICD-10-CM | POA: Diagnosis not present

## 2024-03-23 LAB — BASIC METABOLIC PANEL WITH GFR
Anion gap: 8 (ref 5–15)
BUN: 79 mg/dL — ABNORMAL HIGH (ref 8–23)
CO2: 19 mmol/L — ABNORMAL LOW (ref 22–32)
Calcium: 8.2 mg/dL — ABNORMAL LOW (ref 8.9–10.3)
Chloride: 114 mmol/L — ABNORMAL HIGH (ref 98–111)
Creatinine, Ser: 2.86 mg/dL — ABNORMAL HIGH (ref 0.61–1.24)
GFR, Estimated: 21 mL/min — ABNORMAL LOW (ref >60)
Glucose, Bld: 109 mg/dL — ABNORMAL HIGH (ref 70–99)
Potassium: 4.6 mmol/L (ref 3.5–5.1)
Sodium: 141 mmol/L (ref 135–145)

## 2024-03-23 MED ORDER — SODIUM BICARBONATE 650 MG PO TABS
650.0000 mg | ORAL_TABLET | Freq: Two times a day (BID) | ORAL | Status: DC
  Administered 2024-03-23: 650 mg via ORAL
  Filled 2024-03-23: qty 1

## 2024-03-23 MED ORDER — DOXYCYCLINE HYCLATE 100 MG PO TABS: 100.0000 mg | ORAL_TABLET | Freq: Two times a day (BID) | ORAL | 0 refills | Status: DC

## 2024-03-23 MED ORDER — TORSEMIDE 20 MG PO TABS: ORAL_TABLET | ORAL | Status: DC

## 2024-03-23 MED ORDER — AMOXICILLIN-POT CLAVULANATE 500-125 MG PO TABS
1.0000 | ORAL_TABLET | Freq: Two times a day (BID) | ORAL | Status: DC
  Administered 2024-03-23: 1 via ORAL
  Filled 2024-03-23: qty 1

## 2024-03-23 MED ORDER — AMOXICILLIN-POT CLAVULANATE 875-125 MG PO TABS: 1.0000 | ORAL_TABLET | Freq: Two times a day (BID) | ORAL | 0 refills | Status: AC

## 2024-03-23 NOTE — Plan of Care (Signed)
  Problem: Education: Goal: Ability to describe self-care measures that may prevent or decrease complications (Diabetes Survival Skills Education) will improve Outcome: Adequate for Discharge Goal: Individualized Educational Video(s) Outcome: Adequate for Discharge  Pt ediucation complete rx given to pt and pt being sent to discharge lounge

## 2024-03-23 NOTE — Discharge Summary (Signed)
 Physician Discharge Summary  Jake Gross NWG:[id number] DOB: [birth year] DOA: 03/20/2024  PCP: Dorothe Gaster, NP  Admit date: 03/20/2024 Discharge date: 03/23/2024  Admitted From: Home Disposition:  Home  Recommendations for Outpatient Follow-up:  Follow up with PCP in 1-2 weeks Please obtain BMP/CBC in one week   Home Health:Yes Equipment/Devices:None  Discharge Condition:Stable CODE STATUS:Full Diet recommendation: Heart Healthy   Brief/Interim Summary: You may copy/paste interim summary or write brief hospital course depending on length of stay  Discharge Diagnoses:  Principal Problem:   Generalized weakness Active Problems:   Acute renal failure 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 (HCC)   Paroxysmal atrial fibrillation (HCC)   Diabetes mellitus type 2, insulin  dependent (HCC)   History of CVA (cerebrovascular accident)   CAD (coronary artery disease)   Essential hypertension   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 His leukocytosis resolved we will continue antibiotics for 7 days total Zosyn  outpatient.  Normocytic anemia: Stable follow-up with PCP.  Paroxysmal atrial fibrillation:  Currently rate controlled on no AV nodal agents cardiology was consulted recommended to continue Eliquis .  Chronic HFpEF: He appears dry started on IV fluids and improved. Resume torsemide  in 24 hours as an outpatient.  Chronic kidney see stage IV: No evidence of acute kidney injury as creatinine remained at baseline.  Diabetes mellitus type  2: Hemoglobin A1c less than 5 hold oral hypoglycemic agents were discontinued we will continue diet control as an outpatient.  Normal anion gap metabolic acidosis: Likely due to chronic disease you start on bicarbonate and IV fluids is resolved.  Discharge Instructions  Discharge Instructions     Diet - low sodium heart healthy   Complete by: As directed    Increase activity slowly   Complete by: As directed       Allergies as of 03/23/2024   No Known Allergies      Medication List     TAKE these medications    Accu-Chek Guide test strip Generic drug: glucose blood 1 EACH BY OTHER ROUTE 3 (THREE) TIMES DAILY AS NEEDED FOR OTHER. USE AS INSTRUCTED   acetaminophen  325 MG tablet Commonly known as: TYLENOL  Take 2 tablets (650 mg total) by mouth every 6 (six) hours as needed for mild pain (or Fever >/= 101).   amoxicillin-clavulanate 875-125 MG tablet Commonly known as: AUGMENTIN Take 1 tablet by mouth every 12 (twelve) hours for 4 days.   doxycycline  100 MG tablet Commonly known as: VIBRA -TABS Take 1 tablet (100 mg total) by mouth 2 (two) times daily.   Eliquis  2.5 MG Tabs tablet Generic drug: apixaban  TAKE 1 TABLET BY MOUTH TWICE A DAY   Farxiga 10 MG Tabs tablet Generic drug: dapagliflozin propanediol Take 10 mg by mouth daily.   FreeStyle Libre 2 Reader Orient Use to check blood sugars.   FreeStyle Libre 2 Sensor Misc Apply every 14 days to check blood sugars.   hydrALAZINE  100 MG tablet Commonly known as: APRESOLINE  TAKE 1 TABLET BY MOUTH 3 TIMES DAILY.   isosorbide  mononitrate 60 MG 24 hr tablet Commonly known as: IMDUR   Take 1.5 tablets (90 mg total) by mouth daily.   olmesartan 5 MG tablet Commonly known as: BENICAR Take 5 mg by mouth daily.   onetouch ultrasoft lancets Use as instructed   rosuvastatin  20 MG tablet Commonly known as: CRESTOR  TAKE 1 TABLET BY MOUTH AT BEDTIME. SCHEDULE PHYSICAL EXAM   torsemide  20 MG tablet Commonly known as:  DEMADEX  TAKE 1 TABLET BY MOUTH TWICE A DAY ON MONDAYS AND THURSDAYS ONLY, TAKE 1 TABLET BY MOUTH DAILY ONLY ALL OTHER DAYS Start taking on: March 25, 2024 What changed: These instructions start on March 25, 2024. If you are unsure what to do until then, ask your doctor or other care provider.        No Known Allergies  Consultations: Cardiology   Procedures/Studies: ECHOCARDIOGRAM COMPLETE Result Date: 03/22/2024    ECHOCARDIOGRAM REPORT   Patient Name:   Jake Gross Severin Date of Exam: 03/22/2024 Medical Rec #:  [id number]         Height:       67.0 in Accession #:    [id number]        Weight:       182.6 lb Date of Birth:  [birth year]         BSA:          1.946 m Patient Age:    83 years          BP:           144/68 mmHg Patient Gender: M                 HR:           65 bpm. Exam Location:  Inpatient Procedure: 2D Echo, Color Doppler, Cardiac Doppler and Intracardiac            Opacification Agent (Both Spectral and Color Flow Doppler were            utilized during procedure). Indications:    CHF-Acute Diastolic  History:        Patient has prior history of Echocardiogram examinations, most                 recent 02/04/2023. CAD, Prior CABG, Stroke; Risk                 Factors:Diabetes, Hypertension and Dyslipidemia. CKD.  Sonographer:    Travis Friedman RDCS Referring Phys: [phone] Loyde Rule  Sonographer Comments: Image acquisition challenging due to patient body habitus. IMPRESSIONS  1. Left ventricular ejection fraction, by estimation, is 60 to 65%. The left ventricle has normal function. The left ventricle has no regional wall motion abnormalities. There is moderate left ventricular hypertrophy. Left ventricular diastolic parameters are indeterminate.  2. Right ventricular systolic function is normal. The right ventricular size is normal.  3. Left atrial size was moderately dilated.  4. The mitral valve is abnormal. Mild to moderate mitral valve regurgitation. No evidence of mitral stenosis. Moderate  mitral annular calcification.  5. The aortic valve is tricuspid. There is moderate calcification of the aortic valve. There is moderate thickening of the aortic valve. Aortic valve regurgitation is trivial. Mild aortic valve stenosis.  6. Aortic dilatation noted. There is mild dilatation of the ascending aorta, measuring 40 mm.  7. The inferior vena cava is normal in size with greater than 50% respiratory variability, suggesting right atrial pressure of 3 mmHg. FINDINGS  Left Ventricle: Left ventricular ejection fraction, by estimation, is 60 to 65%. The left ventricle has  normal function. The left ventricle has no regional wall motion abnormalities. Strain was performed and the global longitudinal strain is indeterminate. The left ventricular internal cavity size was normal in size. There is moderate left ventricular hypertrophy. Left ventricular diastolic parameters are indeterminate. Right Ventricle: The right ventricular size is normal. No increase in right ventricular wall thickness. Right ventricular systolic function is normal. Left Atrium: Left atrial size was moderately dilated. Right Atrium: Right atrial size was normal in size. Pericardium: There is no evidence of pericardial effusion. Mitral Valve: The mitral valve is abnormal. There is mild thickening of the mitral valve leaflet(s). There is mild calcification of the mitral valve leaflet(s). Moderate mitral annular calcification. Mild to moderate mitral valve regurgitation. No evidence of mitral valve stenosis. Tricuspid Valve: The tricuspid valve is normal in structure. Tricuspid valve regurgitation is trivial. No evidence of tricuspid stenosis. Aortic Valve: The aortic valve is tricuspid. There is moderate calcification of the aortic valve. There is moderate thickening of the aortic valve. Aortic valve regurgitation is trivial. Mild aortic stenosis is present. Aortic valve mean gradient measures 8.0 mmHg. Aortic valve peak gradient measures 13.8 mmHg.  Aortic valve area, by VTI measures 1.52 cm. Pulmonic Valve: The pulmonic valve was normal in structure. Pulmonic valve regurgitation is trivial. No evidence of pulmonic stenosis. Aorta: Aortic dilatation noted. There is mild dilatation of the ascending aorta, measuring 40 mm. Venous: The inferior vena cava is normal in size with greater than 50% respiratory variability, suggesting right atrial pressure of 3 mmHg. IAS/Shunts: No atrial level shunt detected by color flow Doppler. Additional Comments: 3D was performed not requiring image post processing on an independent workstation and was indeterminate.  LEFT VENTRICLE PLAX 2D LVIDd:         4.70 cm LVIDs:         3.10 cm LV PW:         1.00 cm LV IVS:        1.40 cm LVOT diam:     1.80 cm LV SV:         69 LV SV Index:   35 LVOT Area:     2.54 cm  LV Volumes (MOD) LV vol d, MOD A2C: 183.0 ml LV vol d, MOD A4C: 179.0 ml LV vol s, MOD A2C: 61.5 ml LV vol s, MOD A4C: 72.3 ml LV SV MOD A2C:     121.5 ml LV SV MOD A4C:     179.0 ml LV SV MOD BP:      114.6 ml RIGHT VENTRICLE             IVC RV Basal diam:  3.80 cm     IVC diam: 2.00 cm RV S prime:     10.43 cm/s TAPSE (M-mode): 2.2 cm LEFT ATRIUM              Index        RIGHT ATRIUM           Index LA diam:        5.80 cm  2.98 cm/m   RA Area:     15.00 cm LA Vol (A2C):   153.0 ml 78.64 ml/m  RA Volume:   33.80 ml  17.37 ml/m LA Vol (A4C):   159.0 ml 81.73 ml/m LA Biplane Vol: 156.0 ml 80.18 ml/m  AORTIC VALVE AV Area (Vmax):    1.48 cm AV Area (Vmean):   1.35 cm AV Area (VTI):     1.52 cm AV Vmax:  185.50 cm/s AV Vmean:          132.000 cm/s AV VTI:            0.454 m AV Peak Grad:      13.8 mmHg AV Mean Grad:      8.0 mmHg LVOT Vmax:         107.96 cm/s LVOT Vmean:        69.800 cm/s LVOT VTI:          0.270 m LVOT/AV VTI ratio: 0.60  AORTA Ao Root diam: 3.70 cm Ao Asc diam:  4.00 cm  SHUNTS Systemic VTI:  0.27 m Systemic Diam: 1.80 cm Janelle Mediate MD Electronically signed by Janelle Mediate MD  Signature Date/Time: 03/22/2024/2:37:56 PM    Final    DG Chest Port 1 View Result Date: 03/20/2024 CLINICAL DATA:  sob EXAM: PORTABLE CHEST - 1 VIEW COMPARISON:  Mar 15, 2024 FINDINGS: Low lung volumes. Bilateral perihilar interstitial opacities. No focal airspace consolidation, pleural effusion, or pneumothorax. Cardiomegaly. In planted cardiac loop recorder device. CABG markers and sternotomy wires. Tortuous aorta with aortic atherosclerosis. No acute fracture or destructive lesions. Multilevel thoracic osteophytosis. IMPRESSION: Cardiomegaly with findings of either interstitial edema or atypical/viral infection. Electronically Signed   By: Rance Burrows M.D.   On: 03/20/2024 17:06   DG Chest Portable 1 View Result Date: 03/15/2024 CLINICAL DATA:  Shortness of breath. EXAM: PORTABLE CHEST 1 VIEW COMPARISON:  Chest x-ray 02/01/2023. FINDINGS: The heart is enlarged. There is central pulmonary vascular congestion. There is no focal lung infiltrate, pleural effusion or pneumothorax. Patient is status post cardiac surgery. Loop recorder device overlies the left chest. There is an acute appearing mid left clavicular fracture with 1 shaft width inferior displacement of the distal fracture fragment. IMPRESSION: 1. Cardiomegaly with central pulmonary vascular congestion. 2. Acute appearing mid left clavicular fracture. Please correlate clinically. Electronically Signed   By: Tyron Gallon M.D.   On: 03/15/2024 19:32   (Echo, Carotid, EGD, Colonoscopy, ERCP)    Subjective: No complaints  Discharge Exam: Vitals:   03/23/24 0035 03/23/24 0422  BP: 135/70 (!) 136/54  Pulse: 68 (!) 54  Resp: 20 20  Temp: 98.5 F (36.9 C) 98.4 F (36.9 C)  SpO2: 98% 97%   Vitals:   03/22/24 1507 03/22/24 2009 03/23/24 0035 03/23/24 0422  BP: 132/60 (!) 148/54 135/70 (!) 136/54  Pulse: (!) 55 (!) 46 68 (!) 54  Resp: 20 20 20 20   Temp: 98 F (36.7 C) 97.8 F (36.6 C) 98.5 F (36.9 C) 98.4 F (36.9 C)  TempSrc:  Oral Oral Oral Oral  SpO2: 97% 97% 98% 97%  Weight:    84.2 kg  Height:        General: Pt is alert, awake, not in acute distress Cardiovascular: RRR, S1/S2 +, no rubs, no gallops Respiratory: CTA bilaterally, no wheezing, no rhonchi Abdominal: Soft, NT, ND, bowel sounds + Extremities: no edema, no cyanosis    The results of significant diagnostics from this hospitalization (including imaging, microbiology, ancillary and laboratory) are listed below for reference.     Microbiology: Recent Results (from the past 240 hours)  Resp panel by RT-PCR (RSV, Flu A&B, Covid) Anterior Nasal Swab     Status: None   Collection Time: 03/20/24  6:23 PM   Specimen: Anterior Nasal Swab  Result Value Ref Range Status   SARS Coronavirus 2 by RT PCR NEGATIVE NEGATIVE Final   Influenza A by PCR NEGATIVE NEGATIVE Final  Influenza B by PCR NEGATIVE NEGATIVE Final    Comment: (NOTE) The Xpert Xpress SARS-CoV-2/FLU/RSV plus assay is intended as an aid in the diagnosis of influenza from Nasopharyngeal swab specimens and should not be used as a sole basis for treatment. Nasal washings and aspirates are unacceptable for Xpert Xpress SARS-CoV-2/FLU/RSV testing.  Fact Sheet for Patients: BloggerCourse.com  Fact Sheet for Healthcare Providers: SeriousBroker.it  This test is not yet approved or cleared by the [place]  FDA and has been authorized for detection and/or diagnosis of SARS-CoV-2 by FDA under an Emergency Use Authorization (EUA). This EUA will remain in effect (meaning this test can be used) for the duration of the COVID-19 declaration under Section 564(b)(1) of the Act, 21 U.S.C. section 360bbb-3(b)(1), unless the authorization is terminated or revoked.     Resp Syncytial Virus by PCR NEGATIVE NEGATIVE Final    Comment: (NOTE) Fact Sheet for Patients: BloggerCourse.com  Fact Sheet for Healthcare  Providers: SeriousBroker.it  This test is not yet approved or cleared by the [place]  FDA and has been authorized for detection and/or diagnosis of SARS-CoV-2 by FDA under an Emergency Use Authorization (EUA). This EUA will remain in effect (meaning this test can be used) for the duration of the COVID-19 declaration under Section 564(b)(1) of the Act, 21 U.S.C. section 360bbb-3(b)(1), unless the authorization is terminated or revoked.  Performed at [facility], [address], [place], [place] [address]      Labs: BNP (last 3 results) Recent Labs    03/20/24 1627  BNP 537.5*   Basic Metabolic Panel: Recent Labs  Lab 03/17/24 0707 03/18/24 0551 03/19/24 0546 03/20/24 1627 03/20/24 2230 03/22/24 0829 03/23/24 0316  NA 135 135 137 139  --  139 141  K 5.0 4.6 4.7 5.0  --  4.3 4.6  CL 108 106 108 107  --  108 114*  CO2 17* 20* 22 18*  --  21* 19*  GLUCOSE 83 130* 119* 125*  --  133* 109*  BUN 38* 41* 50* 62*  --  74* 79*  CREATININE 2.53* 2.57* 2.39* 2.66*  --  3.05* 2.86*  CALCIUM  8.3* 7.9* 8.2* 8.7*  --  8.7* 8.2*  MG 2.3 2.3 2.3  --  2.5*  --   --    Liver Function Tests: Recent Labs  Lab 03/17/24 0707 03/20/24 1627  AST 22 31  ALT 11 17  ALKPHOS 107 113  BILITOT 1.1 0.9  PROT 6.5 6.7  ALBUMIN  2.9* 2.9*   No results for input(s): "LIPASE", "AMYLASE" in the last 168 hours. No results for input(s): "AMMONIA" in the last 168 hours. CBC: Recent Labs  Lab 03/17/24 0707 03/18/24 0551 03/19/24 0546 03/20/24 1627 03/22/24 0815  WBC 10.7* 9.8 9.9 11.4* 8.8  NEUTROABS 6.9 6.8 6.8 8.6* 5.9  HGB 10.3* 9.1* 8.9* 9.2* 9.5*  HCT 33.2* 28.5* 27.9* 29.6* 29.8*  MCV 90.5 86.9 87.7 91.6 88.2  PLT 264 207 206 244 266   Cardiac Enzymes: No results for input(s): "CKTOTAL", "CKMB", "CKMBINDEX", "TROPONINI" in the last 168 hours. BNP: Invalid input(s): "POCBNP" CBG: Recent Labs  Lab 03/21/24 0402 03/21/24 1713 03/21/24 2052  03/21/24 2124 03/22/24 0622  GLUCAP 91 122* 135* 151* 102*   D-Dimer No results for input(s): "DDIMER" in the last 72 hours. Hgb A1c No results for input(s): "HGBA1C" in the last 72 hours. Lipid Profile No results for input(s): "CHOL", "HDL", "LDLCALC", "TRIG", "CHOLHDL", "LDLDIRECT" in the last 72 hours. Thyroid  function studies Recent  Labs    03/20/24 2230  TSH 2.672   Anemia work up Recent Labs    03/20/24 2230  VITAMINB12 151*  FOLATE 15.9  FERRITIN 86  TIBC 210*  IRON 15*  RETICCTPCT 2.5   Urinalysis    Component Value Date/Time   COLORURINE YELLOW 03/16/2024 0142   APPEARANCEUR CLEAR 03/16/2024 0142   LABSPEC 1.011 03/16/2024 0142   PHURINE 6.0 03/16/2024 0142   GLUCOSEU 150 (A) 03/16/2024 0142   HGBUR NEGATIVE 03/16/2024 0142   BILIRUBINUR NEGATIVE 03/16/2024 0142   KETONESUR NEGATIVE 03/16/2024 0142   PROTEINUR 100 (A) 03/16/2024 0142   UROBILINOGEN 0.2 04/11/2015 1658   NITRITE NEGATIVE 03/16/2024 0142   LEUKOCYTESUR NEGATIVE 03/16/2024 0142   Sepsis Labs Recent Labs  Lab 03/18/24 0551 03/19/24 0546 03/20/24 1627 03/22/24 0815  WBC 9.8 9.9 11.4* 8.8   Microbiology Recent Results (from the past 240 hours)  Resp panel by RT-PCR (RSV, Flu A&B, Covid) Anterior Nasal Swab     Status: None   Collection Time: 03/20/24  6:23 PM   Specimen: Anterior Nasal Swab  Result Value Ref Range Status   SARS Coronavirus 2 by RT PCR NEGATIVE NEGATIVE Final   Influenza A by PCR NEGATIVE NEGATIVE Final   Influenza B by PCR NEGATIVE NEGATIVE Final    Comment: (NOTE) The Xpert Xpress SARS-CoV-2/FLU/RSV plus assay is intended as an aid in the diagnosis of influenza from Nasopharyngeal swab specimens and should not be used as a sole basis for treatment. Nasal washings and aspirates are unacceptable for Xpert Xpress SARS-CoV-2/FLU/RSV testing.  Fact Sheet for Patients: BloggerCourse.com  Fact Sheet for Healthcare  Providers: SeriousBroker.it  This test is not yet approved or cleared by the [place]  FDA and has been authorized for detection and/or diagnosis of SARS-CoV-2 by FDA under an Emergency Use Authorization (EUA). This EUA will remain in effect (meaning this test can be used) for the duration of the COVID-19 declaration under Section 564(b)(1) of the Act, 21 U.S.C. section 360bbb-3(b)(1), unless the authorization is terminated or revoked.     Resp Syncytial Virus by PCR NEGATIVE NEGATIVE Final    Comment: (NOTE) Fact Sheet for Patients: BloggerCourse.com  Fact Sheet for Healthcare Providers: SeriousBroker.it  This test is not yet approved or cleared by the [place]  FDA and has been authorized for detection and/or diagnosis of SARS-CoV-2 by FDA under an Emergency Use Authorization (EUA). This EUA will remain in effect (meaning this test can be used) for the duration of the COVID-19 declaration under Section 564(b)(1) of the Act, 21 U.S.C. section 360bbb-3(b)(1), unless the authorization is terminated or revoked.  Performed at [facility], [address], [place], [place] [address]      Time coordinating discharge: Over 35 minutes  SIGNED:   Macdonald Savoy, MD  [facility] 03/23/2024, 6:59 AM Pager   If 7PM-7AM, please contact night-coverage [email or web address] Password [email or web address]

## 2024-03-23 NOTE — TOC Transition Note (Signed)
 Transition of Care [facility]) - Discharge Note   Patient Details  Name: Jake Gross MRN: [id number] Date of Birth: 01/05/1941  [facility] CM/SW Contact:  Omie Bickers, RN Phone Number: 03/23/2024, 8:10 AM   Clinical Narrative:     Patient recently discharged with all DME needed.  Will continue HH services through [facility], they have been notified of his DC  Final next level of care: Home w Home Health Services Barriers to Discharge: No Barriers Identified   Patient Goals and CMS Choice Patient states their goals for this hospitalization and ongoing recovery are:: to go home          Discharge Placement                       Discharge Plan and Services Additional resources added to the After Visit Summary for                  DME Arranged: N/A         HH Arranged: RN, PT, OT HH Agency: [facility] Date HH Agency Contacted: 03/23/24 Time HH Agency Contacted: [phone] Representative spoke with at [facility] Agency: weekend on call  Social Drivers of Health (SDOH) Interventions SDOH Screenings   Food Insecurity: No Food Insecurity (03/21/2024)  Housing: Low Risk  (03/21/2024)  Transportation Needs: No Transportation Needs (03/21/2024)  Utilities: Not At Risk (03/21/2024)  Alcohol Screen: Low Risk  (12/11/2023)  Depression (PHQ2-9): Low Risk  (02/21/2024)  Financial Resource Strain: Low Risk  (12/11/2023)  Physical Activity: Insufficiently Active (12/11/2023)  Social Connections: Socially Isolated (03/21/2024)  Stress: No Stress Concern Present (12/11/2023)  Tobacco Use: Medium Risk (03/21/2024)  Health Literacy: Adequate Health Literacy (12/11/2023)     Readmission Risk Interventions    02/05/2023   11:08 AM  Readmission Risk Prevention Plan  Transportation Screening Complete  Medication Review (RN Care Manager) Complete  PCP or Specialist appointment within 3-5 days of discharge Complete  HRI or Home Care Consult Complete  SW Recovery  Care/Counseling Consult Complete  Palliative Care Screening Not Applicable  Skilled Nursing Facility Complete

## 2024-03-24 ENCOUNTER — Emergency Department (HOSPITAL_COMMUNITY)
Admission: EM | Admit: 2024-03-24 | Discharge: 2024-03-25 | Disposition: A | Discharge status: Home / Self Care | Attending: Emergency Medicine | Admitting: Emergency Medicine

## 2024-03-24 ENCOUNTER — Telehealth: Payer: Self-pay

## 2024-03-24 ENCOUNTER — Emergency Department (HOSPITAL_COMMUNITY)

## 2024-03-24 DIAGNOSIS — E1122 Type 2 diabetes mellitus with diabetic chronic kidney disease: Secondary | ICD-10-CM | POA: Insufficient documentation

## 2024-03-24 DIAGNOSIS — I480 Paroxysmal atrial fibrillation: Secondary | ICD-10-CM

## 2024-03-24 DIAGNOSIS — I2510 Atherosclerotic heart disease of native coronary artery without angina pectoris: Secondary | ICD-10-CM | POA: Diagnosis not present

## 2024-03-24 DIAGNOSIS — R2681 Unsteadiness on feet: Secondary | ICD-10-CM | POA: Insufficient documentation

## 2024-03-24 DIAGNOSIS — I5030 Unspecified diastolic (congestive) heart failure: Secondary | ICD-10-CM | POA: Diagnosis not present

## 2024-03-24 DIAGNOSIS — I130 Hypertensive heart and chronic kidney disease with heart failure and stage 1 through stage 4 chronic kidney disease, or unspecified chronic kidney disease: Secondary | ICD-10-CM | POA: Diagnosis not present

## 2024-03-24 DIAGNOSIS — I517 Cardiomegaly: Secondary | ICD-10-CM | POA: Diagnosis not present

## 2024-03-24 DIAGNOSIS — R0989 Other specified symptoms and signs involving the circulatory and respiratory systems: Secondary | ICD-10-CM | POA: Diagnosis not present

## 2024-03-24 DIAGNOSIS — R531 Weakness: Secondary | ICD-10-CM | POA: Diagnosis not present

## 2024-03-24 DIAGNOSIS — I959 Hypotension, unspecified: Secondary | ICD-10-CM | POA: Diagnosis not present

## 2024-03-24 DIAGNOSIS — R0602 Shortness of breath: Secondary | ICD-10-CM | POA: Insufficient documentation

## 2024-03-24 DIAGNOSIS — E782 Mixed hyperlipidemia: Secondary | ICD-10-CM

## 2024-03-24 DIAGNOSIS — N189 Chronic kidney disease, unspecified: Secondary | ICD-10-CM | POA: Insufficient documentation

## 2024-03-24 DIAGNOSIS — Z8673 Personal history of transient ischemic attack (TIA), and cerebral infarction without residual deficits: Secondary | ICD-10-CM | POA: Insufficient documentation

## 2024-03-24 LAB — CBC
HCT: 30.3 % — ABNORMAL LOW (ref 39.0–52.0)
Hemoglobin: 9.2 g/dL — ABNORMAL LOW (ref 13.0–17.0)
MCH: 27.5 pg (ref 26.0–34.0)
MCHC: 30.4 g/dL (ref 30.0–36.0)
MCV: 90.4 fL (ref 80.0–100.0)
Platelets: 288 10*3/uL (ref 150–400)
RBC: 3.35 MIL/uL — ABNORMAL LOW (ref 4.22–5.81)
RDW: 13.6 % (ref 11.5–15.5)
WBC: 9.3 10*3/uL (ref 4.0–10.5)
nRBC: 0 % (ref 0.0–0.2)

## 2024-03-24 LAB — COMPREHENSIVE METABOLIC PANEL WITH GFR
ALT: 22 U/L (ref 0–44)
AST: 32 U/L (ref 15–41)
Albumin: 2.6 g/dL — ABNORMAL LOW (ref 3.5–5.0)
Alkaline Phosphatase: 132 U/L — ABNORMAL HIGH (ref 38–126)
Anion gap: 9 (ref 5–15)
BUN: 66 mg/dL — ABNORMAL HIGH (ref 8–23)
CO2: 19 mmol/L — ABNORMAL LOW (ref 22–32)
Calcium: 8.6 mg/dL — ABNORMAL LOW (ref 8.9–10.3)
Chloride: 114 mmol/L — ABNORMAL HIGH (ref 98–111)
Creatinine, Ser: 2.73 mg/dL — ABNORMAL HIGH (ref 0.61–1.24)
GFR, Estimated: 23 mL/min — ABNORMAL LOW (ref >60)
Glucose, Bld: 149 mg/dL — ABNORMAL HIGH (ref 70–99)
Potassium: 4.8 mmol/L (ref 3.5–5.1)
Sodium: 142 mmol/L (ref 135–145)
Total Bilirubin: 0.6 mg/dL (ref 0.0–1.2)
Total Protein: 6.4 g/dL — ABNORMAL LOW (ref 6.5–8.1)

## 2024-03-24 LAB — PROTIME-INR
INR: 1.5 — ABNORMAL HIGH (ref 0.8–1.2)
Prothrombin Time: 18.1 s — ABNORMAL HIGH (ref 11.4–15.2)

## 2024-03-24 LAB — CBG MONITORING, ED: Glucose-Capillary: 115 mg/dL — ABNORMAL HIGH (ref 70–99)

## 2024-03-24 MED ORDER — SODIUM BICARBONATE 650 MG PO TABS
650.0000 mg | ORAL_TABLET | Freq: Two times a day (BID) | ORAL | Status: DC
  Administered 2024-03-24 – 2024-03-25 (×2): 650 mg via ORAL
  Filled 2024-03-24 (×2): qty 1

## 2024-03-24 MED ORDER — AMOXICILLIN-POT CLAVULANATE 500-125 MG PO TABS
1.0000 | ORAL_TABLET | Freq: Two times a day (BID) | ORAL | Status: DC
  Administered 2024-03-25 (×2): 1 via ORAL
  Filled 2024-03-24 (×2): qty 1

## 2024-03-24 MED ORDER — APIXABAN 2.5 MG PO TABS
2.5000 mg | ORAL_TABLET | Freq: Two times a day (BID) | ORAL | Status: DC
  Administered 2024-03-25 (×2): 2.5 mg via ORAL
  Filled 2024-03-24 (×2): qty 1

## 2024-03-24 MED ORDER — ACETAMINOPHEN 325 MG PO TABS
650.0000 mg | ORAL_TABLET | Freq: Four times a day (QID) | ORAL | Status: DC | PRN
Start: 2024-03-24 — End: 2024-03-25

## 2024-03-24 MED ORDER — ROSUVASTATIN CALCIUM 5 MG PO TABS
20.0000 mg | ORAL_TABLET | Freq: Every day | ORAL | Status: DC
  Administered 2024-03-25: 20 mg via ORAL
  Filled 2024-03-24: qty 4

## 2024-03-24 MED ORDER — PANTOPRAZOLE SODIUM 40 MG PO TBEC
40.0000 mg | DELAYED_RELEASE_TABLET | Freq: Two times a day (BID) | ORAL | Status: DC
  Administered 2024-03-24 – 2024-03-25 (×2): 40 mg via ORAL
  Filled 2024-03-24 (×2): qty 1

## 2024-03-24 MED ORDER — INSULIN ASPART 100 UNIT/ML IJ SOLN: 0.0000 [IU] | Freq: Every day | INTRAMUSCULAR | Status: DC

## 2024-03-24 MED ORDER — HYDRALAZINE HCL 20 MG/ML IJ SOLN: 5.0000 mg | INTRAMUSCULAR | Status: DC | PRN

## 2024-03-24 MED ORDER — INSULIN ASPART 100 UNIT/ML IJ SOLN
0.0000 [IU] | Freq: Three times a day (TID) | INTRAMUSCULAR | Status: DC
  Administered 2024-03-25: 3 [IU] via SUBCUTANEOUS

## 2024-03-24 MED ORDER — ACETAMINOPHEN 650 MG RE SUPP
650.0000 mg | Freq: Four times a day (QID) | RECTAL | Status: DC | PRN
Start: 2024-03-24 — End: 2024-03-25

## 2024-03-24 MED ORDER — DOXYCYCLINE HYCLATE 100 MG PO TABS
100.0000 mg | ORAL_TABLET | Freq: Two times a day (BID) | ORAL | Status: DC
  Administered 2024-03-24 – 2024-03-25 (×2): 100 mg via ORAL
  Filled 2024-03-24 (×2): qty 1

## 2024-03-24 MED ORDER — POLYETHYLENE GLYCOL 3350 17 G PO PACK: 17.0000 g | PACK | Freq: Every day | ORAL | Status: DC | PRN

## 2024-03-24 NOTE — ED Notes (Signed)
 Pt ambulated with a walker to and from restroom without difficulty. Gait steady, no signs of distress, no lightheadedness reported by pt while ambulating. Pt returned safely onto the bed.

## 2024-03-24 NOTE — ED Provider Triage Note (Signed)
 Emergency Medicine Provider Triage Evaluation Note  Jake Gross , a 83 y.o. male  was evaluated in triage.  Pt complains of weakness.  Was just discharged after an admission for weakness that was attributed to atypical pneumonia.  Completed IV antibiotics and is currently on oral antibiotics.  Got home and felt well however woke up this morning with significant weakness.  Lives by himself, has no help.  Does not feel safe at home.  No worsening symptoms from a respiratory standpoint.  Has not had a great appetite.  Review of Systems  Positive: Generalized weakness, decreased appetite, extreme fatigue Negative: Acute chest pain, leg swelling, fever, vomiting  Physical Exam  BP (!) 122/93 (BP Location: Right Arm)   Pulse 71   Temp 98.1 F (36.7 C) (Oral)   Resp 18   SpO2 97%  Gen:   Awake, no distress, fatigued and weak appearing Resp:  Normal effort, slightly diminished at bases MSK:   Moves extremities without difficulty, no swelling of the lower extremities  Medical Decision Making  Medically screening exam initiated at 4:38 PM.  Appropriate orders placed.  Jake Gross was informed that the remainder of the evaluation will be completed by another provider, this initial triage assessment does not replace that evaluation, and the importance of remaining in the ED until their evaluation is complete.  83 year old male presents emergency department with generalized weakness and fatigue after waking up this morning, recent admission for similar complaints attributed to atypical pneumonia.  Patient evaluated sitting up in chair, fully clothed, limited PE. Orders placed. Patient was counseled that they need to remain in the ED until the completion of their work-up including a full H&P, additional testing and results of any tests.  The patient appears stable and the remainder of the encounter may be completed by another provider.   Flonnie Humphrey, DO 03/24/24 1649

## 2024-03-24 NOTE — ED Provider Notes (Signed)
 [facility] Provider Note   CSN: [id number] Arrival date & time: 03/24/24  1545     History Chief Complaint  Patient presents with   Weakness    Jake Gross is a 83 y.o. male w/ PMHx HFpEF hypertension paroxysmal A-fib CAD type 2 diabetes previous CVA CKD who presents to the ED for evaluation of generalized weakness.  Patient states he was recently admitted for pneumonia.  Patient states he was discharged home feeling well but is having worsening shortness of breath and weakness today.  Patient denies any fevers chills or chest pain.  Patient states he has been able to take all of his medications.  Patient denies any lower extremity swelling.  He reports normal p.o. intake.  Patient states one of his home health nurses recommended he come to the ED for inpatient rehab.  In addition to home health nursing his 2 children help take care of him.  Patient uses a walker to ambulate.  Patient states he would prefer to be placed over going back home.       Physical Exam Updated Vital Signs BP (!) 168/132   Pulse 62   Temp 98 F (36.7 C) (Oral)   Resp 16   SpO2 100%  Physical Exam Vitals and nursing note reviewed.  Constitutional:      General: He is not in acute distress.    Appearance: He is well-developed.     Comments: Tired appearing  HENT:     Head: Normocephalic and atraumatic.     Nose: Nose normal.     Mouth/Throat:     Mouth: Mucous membranes are moist.  Eyes:     Conjunctiva/sclera: Conjunctivae normal.  Cardiovascular:     Rate and Rhythm: Normal rate. Rhythm irregular.     Pulses: Normal pulses.     Heart sounds: Normal heart sounds.  Pulmonary:     Effort: Pulmonary effort is normal. No respiratory distress.     Breath sounds: Normal breath sounds. No wheezing or rales.  Abdominal:     Palpations: Abdomen is soft.     Tenderness: There is no abdominal tenderness.  Musculoskeletal:        General: No swelling  or tenderness.     Cervical back: Neck supple.  Skin:    General: Skin is warm and dry.     Capillary Refill: Capillary refill takes less than 2 seconds.  Neurological:     General: No focal deficit present.     Mental Status: He is alert and oriented to person, place, and time.  Psychiatric:        Mood and Affect: Affect is flat.     ED Results / Procedures / Treatments   Labs (all labs ordered are listed, but only abnormal results are displayed) Labs Reviewed  COMPREHENSIVE METABOLIC PANEL WITH GFR - Abnormal; Notable for the following components:      Result Value   Chloride 114 (*)    CO2 19 (*)    Glucose, Bld 149 (*)    BUN 66 (*)    Creatinine, Ser 2.73 (*)    Calcium  8.6 (*)    Total Protein 6.4 (*)    Albumin  2.6 (*)    Alkaline Phosphatase 132 (*)    GFR, Estimated 23 (*)    All other components within normal limits  CBC - Abnormal; Notable for the following components:   RBC 3.35 (*)    Hemoglobin 9.2 (*)  HCT 30.3 (*)    All other components within normal limits  PROTIME-INR - Abnormal; Notable for the following components:   Prothrombin Time 18.1 (*)    INR 1.5 (*)    All other components within normal limits  URINALYSIS, ROUTINE W REFLEX MICROSCOPIC    EKG EKG Interpretation Date/Time:  Monday March 24 2024 15:50:05 EDT Ventricular Rate:  68 PR Interval:    QRS Duration:  90 QT Interval:  450 QTC Calculation: 478 R Axis:   7  Text Interpretation: Atrial fibrillation Possible Anterior infarct , age undetermined Abnormal ECG When compared with ECG of 20-Mar-2024 16:30, PREVIOUS ECG IS PRESENT Similar to previous Confirmed by Florentino Hurdle [phone]) on 03/24/2024 4:07:55 PM  Radiology DG Chest 1 View Result Date: 03/24/2024 CLINICAL DATA:  Pneumonia, weakness EXAM: CHEST  1 VIEW COMPARISON:  03/20/2024 FINDINGS: Prior CABG. Loop recorder projects over the left chest, unchanged. Heart is borderline in size. Mild vascular congestion. Bilateral  interstitial opacities most notable in the lower lobes, slightly improved since prior study. No effusions or acute bony abnormality. IMPRESSION: Borderline cardiomegaly, vascular congestion. Interstitial prominence in the lower lobes, improving since prior study, likely improving edema or infection. Electronically Signed   By: Janeece Mechanic M.D.   On: 03/24/2024 18:10    Medications Ordered in ED Medications  amoxicillin-clavulanate (AUGMENTIN) 500-125 MG per tablet 1 tablet (has no administration in time range)  apixaban  (ELIQUIS ) tablet 2.5 mg (has no administration in time range)  doxycycline  (VIBRA -TABS) tablet 100 mg (has no administration in time range)  pantoprazole  (PROTONIX ) EC tablet 40 mg (has no administration in time range)  rosuvastatin  (CRESTOR ) tablet 20 mg (has no administration in time range)  sodium bicarbonate  tablet 650 mg (has no administration in time range)  acetaminophen  (TYLENOL ) tablet 650 mg (has no administration in time range)    Or  acetaminophen  (TYLENOL ) suppository 650 mg (has no administration in time range)  hydrALAZINE  (APRESOLINE ) injection 5 mg (has no administration in time range)  polyethylene glycol (MIRALAX  / GLYCOLAX ) packet 17 g (has no administration in time range)  insulin  aspart (novoLOG ) injection 0-20 Units (has no administration in time range)  insulin  aspart (novoLOG ) injection 0-5 Units (has no administration in time range)    ED Course/ Medical Decision Making/ A&P  Jake Gross is a 83 y.o. male presents as detailed above  Differential ddx: Deconditioning, AKI, dehydration, inability to complete ADLs.  Pneumonia, effusion, pulmonary edema  On arrival, patient afebrile hemodynamically stable no hypoxia or respiratory distress.  Patient with flat affect.  Lungs clear to auscultation bilaterally.  No pitting edema to extremities.   ED Work-up: Please see details of labs and imaging listed above.  Creatinine at baseline for  patient.  No new anemia or leukocytosis.  No significant electrolyte abnormalities.  Overall labs consistent with patient's baseline. Chest x-ray significantly improved from prior. EKG A-fib which is consistent with patient's history.  Nursing supervised patient ambulating with walker to and from restroom without difficulty.  Patient had steady gait no signs of distress.  Patient was asymptomatic during and after ambulation.  Per review patient was evaluated by physical therapy and Occupational Therapy inpatient both of which recommended home health PT OT upon return home.  In summary, no indication for admission.    Patient will be kept in observation overnight.  Patient will be evaluated by physical therapy and Occupational Therapy tomorrow to assist with transition of care.  Home medications and diet ordered while patient awaits  further evaluation. Please see transition of care notes for remainder of patient's care     Patient seen with supervising physician who agrees with plan.  Final Clinical Impression(s) / ED Diagnoses Final diagnoses:  Weakness    Angele Keller, DO PGY-3 Emergency Medicine    Angele Keller, DO 03/24/24 2324    Deatra Face, MD 03/25/24 [phone]

## 2024-03-24 NOTE — Patient Instructions (Signed)
 Visit Information  Thank you for taking time to visit with me today. Please don't hesitate to contact me if I can be of assistance to you before our next scheduled telephone appointment.  Our next appointment is by telephone on 04/01/24  at 1:00 PM  Following is a copy of your care plan:   Goals Addressed             This Visit's Progress    VBCI Transitions of Care (TOC) Care Plan       Problems:  Recent Hospitalization for treatment of Pulmonary Disease Knowledge Deficit Related to patient states he doesn't know his medications daughter and son set up pill boxes and daughter has his AVS sheet and information  Goal:  Over the next 30 days, the patient will not experience hospital readmission  Interventions:  Transitions of Care: Doctor Visits  - discussed the importance of doctor visits Discussed any new issues with PCP at appointment 03/25/24 at 12 noon   Patient Self Care Activities:  Attend all scheduled provider appointments Call pharmacy for medication refills 3-7 days in advance of running out of medications Call provider office for new concerns or questions  Notify RN Care Manager of [facility] call rescheduling needs Participate in Transition of Care Program/Attend TOC scheduled calls Take medications as prescribed    Plan:  Telephone follow up appointment with care management team member scheduled for:  04/01/2024 at 1:00PM The care management team will reach out to the patient again over the next 7-9 business days. The patient has been provided with contact information for the care management team and has been advised to call with any health related questions or concerns.         Patient verbalizes understanding of instructions and care plan provided today and agrees to view in MyChart. Active MyChart status and patient understanding of how to access instructions and care plan via MyChart confirmed with patient.     Telephone follow up appointment with care management  team member scheduled for: The patient has been provided with contact information for the care management team and has been advised to call with any health related questions or concerns.  Follow up with provider re: 03/25/2024 with Winthrop Hawks, NP encouraged to make sure it's [facility] appointment  Please call the care guide team at [phone] if you need to cancel or reschedule your appointment.   Please call the [place]  National Suicide Prevention Lifeline: 810-870-1369 or TTY: 416-161-8154 TTY (518) 526-4882) to talk to a trained counselor if you are experiencing a Mental Health or Behavioral Health Crisis or need someone to talk to.  Brown Cape, RN, BSN, CCM [facility], [facility] RN Care Manager Direct Dial: [phone]

## 2024-03-24 NOTE — ED Triage Notes (Signed)
 BIB EMS from home pt was discharged from hospital yesterday after being admitted for pneumonia and was in hospital for 1 week. Pt feels like he is extremely week and would like to be placed in a rehab facility.   EMS vs 112/58 70 hr 16rr 94%ra 155cbg

## 2024-03-24 NOTE — Transitions of Care (Post Inpatient/ED Visit) (Signed)
 03/24/2024  Name: Jake Gross MRN: [id number] DOB: 24-Oct-1940  Today's TOC FU Call Status: Today's TOC FU Call Status:: Successful TOC FU Call Completed TOC FU Call Complete Date: 03/24/24 Patient's Name and Date of Birth confirmed.  Transition Care Management Follow-up Telephone Call Date of Discharge: 03/23/24 Discharge Facility: [facility]) Type of Discharge: Inpatient Admission Primary Inpatient Discharge Diagnosis:: Pneumonia How have you been since you were released from the [facility]?: Better Any questions or concerns?: No  Items Reviewed: Did you receive and understand the discharge instructions provided?: Yes (Daughter has instructions to take to appointment tomorrow) Medications obtained,verified, and reconciled?: No ("I have no idea my son, Jake Gross puts them in a pill box") Any new allergies since your discharge?: No Dietary orders reviewed?: Yes Type of Diet Ordered:: Regular low salt Do you have support at home?: Yes People in Home [RPT]: alone Name of Support/Comfort Primary Source: Jake Gross daughter and Jake Gross  Medications Reviewed Today:  *Patient states daughter has his AVS and his son does his pill boxe and he has no idea what his medications are. States daughter is bringing AVS to appointment tomorrow to review with PCP. Daughter is currently at work today. Medications Reviewed Today   Medications were not reviewed in this encounter     Home Care and Equipment/Supplies: Were Home Health Services Ordered?: Yes Name of Home Health Agency:: [name] Listed  patient states "I have no idea who it is but a nurse is suppose to come" Has Agency set up a time to come to your home?: No EMR reviewed for Home Health Orders: Orders present/patient has not received call (refer to CM for follow-up) (Patient states they suppose to start back but he went into [facility] again.) Any new equipment or medical supplies ordered?: Yes Name of Medical supply agency?:  [employer] Were you able to get the equipment/medical supplies?: Yes (has 3 in 1)  Functional Questionnaire: Do you need assistance with bathing/showering or dressing?: No Do you need assistance with meal preparation?: No Do you need assistance with eating?: No  Follow up appointments reviewed:    SDOH Interventions Today    Flowsheet Row Most Recent Value  SDOH Interventions   Food Insecurity Interventions Intervention Not Indicated  Housing Interventions Intervention Not Indicated  Transportation Interventions Intervention Not Indicated  Utilities Interventions Intervention Not Indicated       Goals Addressed             This Visit's Progress    VBCI Transitions of Care (TOC) Care Plan       Problems:  Recent Hospitalization for treatment of Pulmonary Disease Knowledge Deficit Related to patient states he doesn't know his medications daughter and son set up pill boxes and daughter has his AVS sheet and information  Goal:  Over the next 30 days, the patient will not experience [facility] readmission  Interventions:  Transitions of Care: Doctor Visits  - discussed the importance of doctor visits Discussed any new issues with PCP at appointment 03/25/24 at 12 noon   Patient Self Care Activities:  Attend all scheduled provider appointments Call pharmacy for medication refills 3-7 days in advance of running out of medications Call provider office for new concerns or questions  Notify RN Care Manager of [facility] call rescheduling needs Participate in Transition of Care Program/Attend TOC scheduled calls Take medications as prescribed    Plan:  Telephone follow up appointment with care management team member scheduled for:  04/01/2024 at 1:00PM The care management  team will reach out to the patient again over the next 7-9 business days. The patient has been provided with contact information for the care management team and has been advised to call with any health related questions  or concerns.         Brown Cape, RN, BSN, CCM [facility], [facility] RN Care Manager Direct Dial: [phone]

## 2024-03-25 ENCOUNTER — Ambulatory Visit: Admitting: Nurse Practitioner

## 2024-03-25 DIAGNOSIS — M6281 Muscle weakness (generalized): Secondary | ICD-10-CM | POA: Diagnosis not present

## 2024-03-25 DIAGNOSIS — E860 Dehydration: Secondary | ICD-10-CM | POA: Diagnosis not present

## 2024-03-25 DIAGNOSIS — Z85048 Personal history of other malignant neoplasm of rectum, rectosigmoid junction, and anus: Secondary | ICD-10-CM | POA: Diagnosis not present

## 2024-03-25 DIAGNOSIS — I959 Hypotension, unspecified: Secondary | ICD-10-CM | POA: Diagnosis not present

## 2024-03-25 DIAGNOSIS — R110 Nausea: Secondary | ICD-10-CM | POA: Diagnosis not present

## 2024-03-25 DIAGNOSIS — N184 Chronic kidney disease, stage 4 (severe): Secondary | ICD-10-CM | POA: Diagnosis not present

## 2024-03-25 DIAGNOSIS — R630 Anorexia: Secondary | ICD-10-CM | POA: Diagnosis not present

## 2024-03-25 DIAGNOSIS — Z794 Long term (current) use of insulin: Secondary | ICD-10-CM | POA: Diagnosis not present

## 2024-03-25 DIAGNOSIS — Z5189 Encounter for other specified aftercare: Secondary | ICD-10-CM | POA: Diagnosis not present

## 2024-03-25 DIAGNOSIS — R2689 Other abnormalities of gait and mobility: Secondary | ICD-10-CM | POA: Diagnosis not present

## 2024-03-25 DIAGNOSIS — I5030 Unspecified diastolic (congestive) heart failure: Secondary | ICD-10-CM | POA: Diagnosis not present

## 2024-03-25 DIAGNOSIS — D500 Iron deficiency anemia secondary to blood loss (chronic): Secondary | ICD-10-CM | POA: Diagnosis not present

## 2024-03-25 DIAGNOSIS — R0989 Other specified symptoms and signs involving the circulatory and respiratory systems: Secondary | ICD-10-CM | POA: Diagnosis not present

## 2024-03-25 DIAGNOSIS — Z79899 Other long term (current) drug therapy: Secondary | ICD-10-CM | POA: Diagnosis not present

## 2024-03-25 DIAGNOSIS — I5022 Chronic systolic (congestive) heart failure: Secondary | ICD-10-CM | POA: Diagnosis not present

## 2024-03-25 DIAGNOSIS — E1122 Type 2 diabetes mellitus with diabetic chronic kidney disease: Secondary | ICD-10-CM | POA: Diagnosis not present

## 2024-03-25 DIAGNOSIS — N183 Chronic kidney disease, stage 3 unspecified: Secondary | ICD-10-CM | POA: Diagnosis not present

## 2024-03-25 DIAGNOSIS — R531 Weakness: Secondary | ICD-10-CM | POA: Diagnosis not present

## 2024-03-25 DIAGNOSIS — Z7401 Bed confinement status: Secondary | ICD-10-CM | POA: Diagnosis not present

## 2024-03-25 DIAGNOSIS — N179 Acute kidney failure, unspecified: Secondary | ICD-10-CM | POA: Diagnosis not present

## 2024-03-25 DIAGNOSIS — E119 Type 2 diabetes mellitus without complications: Secondary | ICD-10-CM | POA: Diagnosis not present

## 2024-03-25 DIAGNOSIS — E440 Moderate protein-calorie malnutrition: Secondary | ICD-10-CM | POA: Diagnosis not present

## 2024-03-25 DIAGNOSIS — I5032 Chronic diastolic (congestive) heart failure: Secondary | ICD-10-CM | POA: Diagnosis not present

## 2024-03-25 DIAGNOSIS — I2510 Atherosclerotic heart disease of native coronary artery without angina pectoris: Secondary | ICD-10-CM | POA: Diagnosis not present

## 2024-03-25 DIAGNOSIS — R278 Other lack of coordination: Secondary | ICD-10-CM | POA: Diagnosis not present

## 2024-03-25 DIAGNOSIS — Z950 Presence of cardiac pacemaker: Secondary | ICD-10-CM | POA: Diagnosis not present

## 2024-03-25 DIAGNOSIS — I4891 Unspecified atrial fibrillation: Secondary | ICD-10-CM | POA: Diagnosis not present

## 2024-03-25 DIAGNOSIS — Z7901 Long term (current) use of anticoagulants: Secondary | ICD-10-CM | POA: Diagnosis not present

## 2024-03-25 DIAGNOSIS — Z87891 Personal history of nicotine dependence: Secondary | ICD-10-CM | POA: Diagnosis not present

## 2024-03-25 DIAGNOSIS — I130 Hypertensive heart and chronic kidney disease with heart failure and stage 1 through stage 4 chronic kidney disease, or unspecified chronic kidney disease: Secondary | ICD-10-CM | POA: Diagnosis not present

## 2024-03-25 DIAGNOSIS — I509 Heart failure, unspecified: Secondary | ICD-10-CM | POA: Diagnosis not present

## 2024-03-25 DIAGNOSIS — R1111 Vomiting without nausea: Secondary | ICD-10-CM | POA: Diagnosis not present

## 2024-03-25 DIAGNOSIS — R079 Chest pain, unspecified: Secondary | ICD-10-CM | POA: Diagnosis not present

## 2024-03-25 DIAGNOSIS — J189 Pneumonia, unspecified organism: Secondary | ICD-10-CM | POA: Diagnosis not present

## 2024-03-25 DIAGNOSIS — R262 Difficulty in walking, not elsewhere classified: Secondary | ICD-10-CM | POA: Diagnosis not present

## 2024-03-25 DIAGNOSIS — R42 Dizziness and giddiness: Secondary | ICD-10-CM | POA: Diagnosis not present

## 2024-03-25 DIAGNOSIS — I480 Paroxysmal atrial fibrillation: Secondary | ICD-10-CM | POA: Diagnosis not present

## 2024-03-25 DIAGNOSIS — Z8673 Personal history of transient ischemic attack (TIA), and cerebral infarction without residual deficits: Secondary | ICD-10-CM | POA: Diagnosis not present

## 2024-03-25 DIAGNOSIS — J849 Interstitial pulmonary disease, unspecified: Secondary | ICD-10-CM | POA: Diagnosis not present

## 2024-03-25 DIAGNOSIS — E875 Hyperkalemia: Secondary | ICD-10-CM | POA: Diagnosis not present

## 2024-03-25 DIAGNOSIS — C801 Malignant (primary) neoplasm, unspecified: Secondary | ICD-10-CM | POA: Diagnosis not present

## 2024-03-25 DIAGNOSIS — E785 Hyperlipidemia, unspecified: Secondary | ICD-10-CM | POA: Diagnosis not present

## 2024-03-25 DIAGNOSIS — R0602 Shortness of breath: Secondary | ICD-10-CM | POA: Diagnosis not present

## 2024-03-25 DIAGNOSIS — N289 Disorder of kidney and ureter, unspecified: Secondary | ICD-10-CM | POA: Diagnosis not present

## 2024-03-25 LAB — URINALYSIS, ROUTINE W REFLEX MICROSCOPIC
Bacteria, UA: NONE SEEN
Bilirubin Urine: NEGATIVE
Glucose, UA: 150 mg/dL — AB
Hgb urine dipstick: NEGATIVE
Ketones, ur: NEGATIVE mg/dL
Leukocytes,Ua: NEGATIVE
Nitrite: NEGATIVE
Protein, ur: 100 mg/dL — AB
Specific Gravity, Urine: 1.01 (ref 1.005–1.030)
pH: 5 (ref 5.0–8.0)

## 2024-03-25 LAB — CBG MONITORING, ED
Glucose-Capillary: 123 mg/dL — ABNORMAL HIGH (ref 70–99)
Glucose-Capillary: 116 mg/dL — ABNORMAL HIGH (ref 70–99)

## 2024-03-25 MED ORDER — IRBESARTAN 75 MG PO TABS
37.5000 mg | ORAL_TABLET | Freq: Every day | ORAL | Status: DC
  Administered 2024-03-25: 37.5 mg via ORAL
  Filled 2024-03-25: qty 0.5

## 2024-03-25 MED ORDER — DAPAGLIFLOZIN PROPANEDIOL 5 MG PO TABS: 10.0000 mg | ORAL_TABLET | Freq: Every day | ORAL | 0 refills | Status: DC

## 2024-03-25 MED ORDER — ROSUVASTATIN CALCIUM 20 MG PO TABS: ORAL_TABLET | ORAL | 1 refills | Status: DC

## 2024-03-25 MED ORDER — APIXABAN 2.5 MG PO TABS: 2.5000 mg | ORAL_TABLET | Freq: Two times a day (BID) | ORAL | 1 refills | Status: DC

## 2024-03-25 MED ORDER — TORSEMIDE 20 MG PO TABS: 20.0000 mg | ORAL_TABLET | ORAL | Status: DC

## 2024-03-25 MED ORDER — OLMESARTAN MEDOXOMIL 5 MG PO TABS: 5.0000 mg | ORAL_TABLET | Freq: Every day | ORAL | 0 refills | Status: DC

## 2024-03-25 MED ORDER — TORSEMIDE 20 MG PO TABS: ORAL_TABLET | ORAL | Status: DC

## 2024-03-25 MED ORDER — ACETAMINOPHEN 325 MG PO TABS: 650.0000 mg | ORAL_TABLET | Freq: Four times a day (QID) | ORAL | Status: DC | PRN

## 2024-03-25 MED ORDER — ISOSORBIDE MONONITRATE ER 60 MG PO TB24: 90.0000 mg | ORAL_TABLET | Freq: Every day | ORAL | 3 refills | Status: DC

## 2024-03-25 MED ORDER — DAPAGLIFLOZIN PROPANEDIOL 10 MG PO TABS
10.0000 mg | ORAL_TABLET | Freq: Every day | ORAL | Status: DC
  Administered 2024-03-25: 10 mg via ORAL
  Filled 2024-03-25: qty 1

## 2024-03-25 MED ORDER — AMOXICILLIN-POT CLAVULANATE 500-125 MG PO TABS
1.0000 | ORAL_TABLET | Freq: Two times a day (BID) | ORAL | Status: DC
  Filled 2024-03-25: qty 1

## 2024-03-25 NOTE — ED Notes (Signed)
 attempted to call report to [facility], call disconnected

## 2024-03-25 NOTE — ED Notes (Signed)
 Ptar called eta not given

## 2024-03-25 NOTE — Progress Notes (Addendum)
 12:05pm: CSW spoke with [name] at [facility] who states the facility can accept patient today as he has had a qualifying stay for SNF placement.  Patient will go to [facility], room 102 via [facility] - RN to call when ready. The number to call for report is [phone].   10:45am: CSW spoke with patient at bedside regarding PT recommendation for SNF. Patient states his home health RN suggested he go to a facility for STR and he is agreeable. Patient states he has been to [facility] before and prefers to return there. Patient requested CSW contact his children to inform them of plan.  CSW spoke with patient's daughter [name] to inform her of plan. [name] states agreement and that she will contact her brother [name] to inform him of plan.  CSW sent message to [name] at [facility] to request she review patient for a possible bed offer.  9:25am: CSW received consult for possible SNF placement.  PT has signed in, CSW will wait for their recommendations prior to proceeding with discharge planning.  Shepard Dicker, MSW, LCSW Transitions of Care  Clinical Social Worker II [phone]

## 2024-03-25 NOTE — Evaluation (Signed)
 Occupational Therapy Evaluation Patient Details Name: Jake Gross MRN: [id number] DOB: 1941-01-23 Today's Date: 03/25/2024   History of Present Illness   83 y.o. male w/ PMHx HFpEF hypertension paroxysmal A-fib CAD type 2 diabetes previous CVA CKD who presented 03/24/24 for evaluation of generalized weakness.  Patient has two recent hospitalizations in June, 202052.     Clinical Impressions Patient admitted for the diagnosis above.  PTA he lives at home alone, his children stop by to assist as needed, and he was undergoing HH services.  Patient presents with generalized weakness, balance deficits impacting independence.  Currently needing Min A to Mod A for ADL completion and simple transfers.  OT will continue efforts in the acute setting to address deficits and Patient will benefit from continued inpatient follow up therapy, <3 hours/day.     If plan is discharge home, recommend the following:   A little help with walking and/or transfers;A little help with bathing/dressing/bathroom;Assistance with cooking/housework;Assist for transportation;Help with stairs or ramp for entrance     Functional Status Assessment   Patient has had a recent decline in their functional status and demonstrates the ability to make significant improvements in function in a reasonable and predictable amount of time.     Equipment Recommendations   None recommended by OT     Recommendations for Other Services         Precautions/Restrictions   Precautions Precautions: Fall Recall of Precautions/Restrictions: Intact Restrictions Weight Bearing Restrictions Per Provider Order: No     Mobility Bed Mobility Overal bed mobility: Needs Assistance Bed Mobility: Supine to Sit, Sit to Supine     Supine to sit: Min assist, HOB elevated Sit to supine: Supervision, HOB elevated        Transfers Overall transfer level: Needs assistance Equipment used: Rolling walker (2  wheels) Transfers: Sit to/from Stand Sit to Stand: Supervision, Contact guard assist                  Balance Overall balance assessment: Needs assistance Sitting-balance support: Feet supported Sitting balance-Leahy Scale: Fair     Standing balance support: Reliant on assistive device for balance Standing balance-Leahy Scale: Poor                             ADL either performed or assessed with clinical judgement   ADL Overall ADL's : Needs assistance/impaired Eating/Feeding: Independent;Sitting   Grooming: Set up;Sitting   Upper Body Bathing: Set up;Sitting   Lower Body Bathing: Minimal assistance;Sit to/from stand   Upper Body Dressing : Set up;Sitting   Lower Body Dressing: Moderate assistance;Sit to/from stand   Toilet Transfer: Minimal Holiday representative;Ambulation;Rolling walker (2 wheels);Contact guard assist   Toileting- Clothing Manipulation and Hygiene: Sit to/from stand;Supervision/safety               Vision Patient Visual Report: No change from baseline       Perception Perception: Not tested       Praxis Praxis: Not tested       Pertinent Vitals/Pain Pain Assessment Pain Assessment: No/denies pain Pain Intervention(s): Monitored during session     Extremity/Trunk Assessment Upper Extremity Assessment Upper Extremity Assessment: Generalized weakness   Lower Extremity Assessment Lower Extremity Assessment: Defer to PT evaluation   Cervical / Trunk Assessment Cervical / Trunk Assessment: Kyphotic   Communication Communication Communication: Impaired Factors Affecting Communication: Hearing impaired   Cognition Arousal: Alert Behavior During Therapy: WFL for tasks assessed/performed Cognition:  No apparent impairments                               Following commands: Intact       Cueing  General Comments   Cueing Techniques: Verbal cues   SOB with mobility, but VSS on RA   Exercises      Shoulder Instructions      Home Living Family/patient expects to be discharged to:: Private residence Living Arrangements: Alone Available Help at Discharge: Family;Available PRN/intermittently Type of Home: House Home Access: Stairs to enter [employer] of Steps: 5 Entrance Stairs-Rails: Right;Left Home Layout: One level     Bathroom Shower/Tub: Chief Strategy Officer: Handicapped height Bathroom Accessibility: Yes How Accessible: Accessible via walker Home Equipment: Cane - single point;Rolling Walker (2 wheels)   Additional Comments: reports he is still driving      Prior Functioning/Environment Prior Level of Function : Independent/Modified Independent;Driving             Mobility Comments: recently ambulating with rolling walker. reports difficulty with mobility since recent hospital discharge ADLs Comments: Reports independence with ADLs, IADLs, still driving up until recent hospital stay, receiving [facility] RN and therapies.  Children were stopping by to assist as needed.    OT Problem List: Decreased strength;Decreased activity tolerance;Impaired balance (sitting and/or standing);Decreased safety awareness   OT Treatment/Interventions: Self-care/ADL training;Therapeutic exercise;DME and/or AE instruction;Therapeutic activities;Patient/family education;Balance training      OT Goals(Current goals can be found in the care plan section)   Acute Rehab OT Goals Patient Stated Goal: Return home stronger OT Goal Formulation: With patient Time For Goal Achievement: 04/08/24 Potential to Achieve Goals: Good ADL Goals Pt Will Perform Grooming: with modified independence;standing Pt Will Perform Lower Body Dressing: with modified independence;sit to/from stand Pt Will Transfer to Toilet: with modified independence;regular height toilet;ambulating   OT Frequency:  Min 2X/week    Co-evaluation              AM-PAC OT "6 Clicks" Daily  Activity     Outcome Measure Help from another person eating meals?: None Help from another person taking care of personal grooming?: A Little Help from another person toileting, which includes using toliet, bedpan, or urinal?: A Little Help from another person bathing (including washing, rinsing, drying)?: A Lot Help from another person to put on and taking off regular upper body clothing?: None Help from another person to put on and taking off regular lower body clothing?: A Lot 6 Click Score: 18   End of Session Equipment Utilized During Treatment: Rolling walker (2 wheels) Nurse Communication: Mobility status  Activity Tolerance: Patient tolerated treatment well Patient left: in bed;with call bell/phone within reach  OT Visit Diagnosis: Unsteadiness on feet (R26.81);Muscle weakness (generalized) (M62.81)                Time: 1610-9604 OT Time Calculation (min): 17 min Charges:  OT General Charges $OT Visit: 1 Visit OT Evaluation $OT Eval Moderate Complexity: 1 Mod  .rdp  Miracle Criado D Vesper Trant 03/25/2024, 9:18 AM

## 2024-03-25 NOTE — ED Provider Notes (Signed)
 Emergency Medicine Observation Re-evaluation Note  Jake Gross is a 83 y.o. male, seen on rounds today.  Pt initially presented to the ED for complaints of Weakness Currently, the patient is resting comfortably.  Physical Exam  BP (!) 148/70 (BP Location: Right Arm)   Pulse 62   Temp 98.1 F (36.7 C) (Oral)   Resp 18   SpO2 99%  Physical Exam General: nad Cardiac: good peripheral perfusion Lungs: bilateral chest rise Psych: resting comfortably  ED Course / MDM  EKG:EKG Interpretation Date/Time:  Monday March 24 2024 15:50:05 EDT Ventricular Rate:  68 PR Interval:    QRS Duration:  90 QT Interval:  450 QTC Calculation: 478 R Axis:   7  Text Interpretation: Atrial fibrillation Possible Anterior infarct , age undetermined Abnormal ECG When compared with ECG of 20-Mar-2024 16:30, PREVIOUS ECG IS PRESENT Similar to previous Confirmed by Florentino Hurdle [phone]) on 03/24/2024 4:07:55 PM  I have reviewed the labs performed to date as well as medications administered while in observation.  Recent changes in the last 24 hours include Patient seen in ED, requesting placement.   Plan  Current plan is for PT/OT, social work eval.    Albertus Hughs, DO 03/25/24 [phone]

## 2024-03-25 NOTE — Evaluation (Addendum)
 Physical Therapy Evaluation Patient Details Name: Jake Gross MRN: [id number] DOB: [birth year] Today's Date: 03/25/2024  History of Present Illness  Pt is a 83 y.o. M who presents after recent hospitalization for PNA with generalized weakness. Significant PMH: HFpEF hypertension paroxysmal A-fib CAD type 2 diabetes previous CVA CKD  Clinical Impression  PTA, pt lives at home alone, receives [facility] services and has children who assist intermittently with IADL's. Pt presents with generalized weakness, impaired standing balance and decreased activity tolerance. Pt requiring up to min assist for ambulation using Rollator. Based on deficits and decreased caregiver support, recommend continued inpatient follow up therapy, <3 hours/day.       If plan is discharge home, recommend the following: A little help with walking and/or transfers;A little help with bathing/dressing/bathroom;Assist for transportation;Help with stairs or ramp for entrance;Assistance with cooking/housework   Can travel by private vehicle   Yes    Equipment Recommendations None recommended by PT  Recommendations for Other Services       Functional Status Assessment Patient has had a recent decline in their functional status and demonstrates the ability to make significant improvements in function in a reasonable and predictable amount of time.     Precautions / Restrictions Precautions Precautions: Fall Recall of Precautions/Restrictions: Intact Restrictions Weight Bearing Restrictions Per Provider Order: No      Mobility  Bed Mobility Overal bed mobility: Needs Assistance Bed Mobility: Supine to Sit, Sit to Supine     Supine to sit: Min assist, HOB elevated Sit to supine: Supervision   General bed mobility comments: MinA to execute sitting trunk fully upright    Transfers Overall transfer level: Needs assistance Equipment used: Rollator (4 wheels) Transfers: Sit to/from Stand Sit to Stand: Contact  guard assist                Ambulation/Gait Ambulation/Gait assistance: Min assist, Contact guard assist Gait Distance (Feet): 150 Feet Assistive device: Rollator (4 wheels) Gait Pattern/deviations: Step-through pattern, Trunk flexed, Decreased stride length, Narrow base of support, Knee flexed in stance - right, Knee flexed in stance - left Gait velocity: decreased Gait velocity interpretation: <1.8 ft/sec, indicate of risk for recurrent falls   General Gait Details: Pt with posterior LOB when stepping backwards  Stairs            Wheelchair Mobility     Tilt Bed    Modified Rankin (Stroke Patients Only)       Balance Overall balance assessment: Needs assistance Sitting-balance support: Feet supported Sitting balance-Leahy Scale: Fair     Standing balance support: Reliant on assistive device for balance Standing balance-Leahy Scale: Poor                               Pertinent Vitals/Pain Pain Assessment Pain Assessment: No/denies pain    Home Living Family/patient expects to be discharged to:: Private residence Living Arrangements: Alone Available Help at Discharge: Family;Available PRN/intermittently Type of Home: House Home Access: Stairs to enter Entrance Stairs-Rails: Doctor, general practice of Steps: 5   Home Layout: One level Home Equipment: Cane - single Librarian, academic (2 wheels) Additional Comments: reports he is still driving    Prior Function Prior Level of Function : Independent/Modified Independent;Driving             Mobility Comments: recently ambulating with rolling walker. reports difficulty with mobility since recent hospital discharge ADLs Comments: Reports independence with ADLs, IADLs, still driving up  until recent hospital stay, receiving [facility] RN and therapies.  Children were stopping by to assist as needed.     Extremity/Trunk Assessment   Upper Extremity Assessment Upper Extremity  Assessment: Generalized weakness    Lower Extremity Assessment Lower Extremity Assessment: Generalized weakness    Cervical / Trunk Assessment Cervical / Trunk Assessment: Kyphotic  Communication   Communication Communication: Impaired Factors Affecting Communication: Hearing impaired    Cognition Arousal: Alert Behavior During Therapy: WFL for tasks assessed/performed   PT - Cognitive impairments: No apparent impairments                         Following commands: Intact       Cueing Cueing Techniques: Verbal cues     General Comments      Exercises     Assessment/Plan    PT Assessment Patient needs continued PT services  PT Problem List Decreased strength;Decreased range of motion;Decreased activity tolerance;Decreased balance;Decreased mobility       PT Treatment Interventions DME instruction;Gait training;Stair training;Functional mobility training;Therapeutic exercise;Therapeutic activities;Balance training;Patient/family education    PT Goals (Current goals can be found in the Care Plan section)  Acute Rehab PT Goals Patient Stated Goal: agreeable to short term rehab PT Goal Formulation: With patient Time For Goal Achievement: 04/08/24 Potential to Achieve Goals: Good    Frequency Min 2X/week     Co-evaluation               AM-PAC PT "6 Clicks" Mobility  Outcome Measure Help needed turning from your back to your side while in a flat bed without using bedrails?: A Little Help needed moving from lying on your back to sitting on the side of a flat bed without using bedrails?: A Little Help needed moving to and from a bed to a chair (including a wheelchair)?: A Little Help needed standing up from a chair using your arms (e.g., wheelchair or bedside chair)?: A Little Help needed to walk in hospital room?: A Little Help needed climbing 3-5 steps with a railing? : A Lot 6 Click Score: 17    End of Session Equipment Utilized During  Treatment: Gait belt Activity Tolerance: Patient tolerated treatment well Patient left: in bed;with call bell/phone within reach Nurse Communication: Mobility status PT Visit Diagnosis: Unsteadiness on feet (R26.81);Muscle weakness (generalized) (M62.81)    Time: 3474-2595 PT Time Calculation (min) (ACUTE ONLY): 17 min   Charges:   PT Evaluation $PT Eval Low Complexity: 1 Low   PT General Charges $$ ACUTE PT VISIT: 1 Visit         Verdia Glad, PT, DPT Acute Rehabilitation Services Office [phone]   Claria Crofts 03/25/2024, 10:23 AM

## 2024-03-25 NOTE — NC FL2 (Signed)
 [place]  MEDICAID FL2 LEVEL OF CARE FORM     IDENTIFICATION  Patient Name: Jake Gross Birthdate: 1941/05/23 Sex: male Admission Date (Current Location): 03/24/2024  [facility] and [place] Number:  Producer, television/film/video and Address:  [facility], [address] [place], [place] [address]      Provider Number: [id number]  Attending Physician Name and Address:  System, Provider Not In  Relative Name and Phone Number:       Current Level of Care: Hospital Recommended Level of Care: Skilled Nursing Facility Prior Approval Number:    Date Approved/Denied:   PASRR Number: [id number]  Discharge Plan: SNF    Current Diagnoses: Patient Active Problem List   Diagnosis Date Noted   Chronic a-fib (HCC) 03/22/2024   Diastolic CHF, chronic (HCC) 03/22/2024   Generalized weakness 03/20/2024   Hypoglycemia 03/16/2024   Right hip pain 10/12/2023   Enlarged and hypertrophic nails 07/13/2023   Hospital discharge follow-up 03/05/2023   Acute-on-chronic kidney injury (HCC) 02/01/2023   AKI (acute kidney injury) (HCC) 01/25/2023   Acute renal failure 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 (HCC) 09/28/2022   Elevated troponin 09/28/2022   Lactic acidosis 09/28/2022   Prolonged QT interval 09/28/2022   Medicare annual wellness visit, subsequent 10/29/2021   Abnormal SPEP 09/12/2021   Dyspnea 06/27/2021   Abscess 06/27/2021   History of anemia 06/27/2021   Nocturia 06/27/2021   Benign hypertensive kidney disease with chronic kidney disease 12/22/2019   Proteinuria 12/22/2019   Stage 3b chronic kidney disease (HCC) 12/22/2019   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   Hypokalemia 12/22/2019   History of GI bleed 09/29/2019   CAD (coronary artery disease) 09/29/2019   AVM (arteriovenous malformation) of small bowel, 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   Iron deficiency anemia due to chronic blood loss     Chronic anticoagulation    History of CVA (cerebrovascular accident) 08/25/2019   (HFpEF) heart failure with preserved ejection fraction (HCC) 12/17/2018   Paroxysmal atrial fibrillation (HCC) 12/17/2018   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   Unstable angina (HCC)    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   HLD (hyperlipidemia) 09/14/2015   Essential hypertension    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    Arthritis 09/16/2012   Diabetes mellitus type 2, insulin  dependent (HCC) 08/18/2011    Orientation RESPIRATION BLADDER Height & Weight     Self, Place, Situation, Time  Normal Continent Weight:   Height:     BEHAVIORAL SYMPTOMS/MOOD NEUROLOGICAL BOWEL NUTRITION STATUS      Continent Diet (Normal)  AMBULATORY STATUS COMMUNICATION OF NEEDS Skin   Supervision   Normal                       Personal Care Assistance Level of Assistance  Bathing, Dressing, Feeding Bathing Assistance: Limited assistance Feeding assistance: Independent Dressing Assistance: Limited assistance     Functional Limitations Info  Sight, Hearing, Speech Sight Info: Adequate Hearing Info: Adequate Speech Info: Adequate    SPECIAL CARE FACTORS FREQUENCY  PT (By licensed PT), OT (By licensed OT)     PT Frequency: 3x weekly OT Frequency: 3x weekly            Contractures Contractures Info: Not present    Additional Factors Info  Code Status, Allergies Code Status Info: Full Code Allergies Info: No known allergies  Current Medications (03/25/2024):  This is the current hospital active medication list Current Facility-Administered Medications  Medication Dose Route Frequency Provider Last Rate Last Admin   acetaminophen  (TYLENOL ) tablet 650 mg  650 mg Oral Q6H PRN Angele Keller, DO       Or   acetaminophen  (TYLENOL ) suppository 650 mg  650 mg Rectal Q6H PRN Angele Keller, DO       amoxicillin-clavulanate (AUGMENTIN) 500-125 MG per tablet 1 tablet  1  tablet Oral Q12H Angele Keller, DO   1 tablet at 03/25/24 0117   apixaban  (ELIQUIS ) tablet 2.5 mg  2.5 mg Oral BID Angele Keller, DO   2.5 mg at 03/25/24 0117   doxycycline  (VIBRA -TABS) tablet 100 mg  100 mg Oral BID Angele Keller, DO   100 mg at 03/24/24 2311   hydrALAZINE  (APRESOLINE ) injection 5 mg  5 mg Intravenous Q4H PRN Angele Keller, DO       insulin  aspart (novoLOG ) injection 0-20 Units  0-20 Units Subcutaneous TID WC Angele Keller, DO   3 Units at 03/25/24 4401   insulin  aspart (novoLOG ) injection 0-5 Units  0-5 Units Subcutaneous QHS Angele Keller, DO       pantoprazole  (PROTONIX ) EC tablet 40 mg  40 mg Oral BID Angele Keller, DO   40 mg at 03/24/24 2311   polyethylene glycol (MIRALAX  / GLYCOLAX ) packet 17 g  17 g Oral Daily PRN Angele Keller, DO       rosuvastatin  (CRESTOR ) tablet 20 mg  20 mg Oral Daily Angele Keller, DO       sodium bicarbonate  tablet 650 mg  650 mg Oral BID Angele Keller, DO   650 mg at 03/24/24 2311   Current Outpatient Medications  Medication Sig Dispense Refill   acetaminophen  (TYLENOL ) 325 MG tablet Take 2 tablets (650 mg total) by mouth every 6 (six) hours as needed for mild pain (or Fever >/= 101).     amoxicillin-clavulanate (AUGMENTIN) 875-125 MG tablet Take 1 tablet by mouth every 12 (twelve) hours for 4 days. 8 tablet 0   dapagliflozin propanediol (FARXIGA) 10 MG TABS tablet Take 10 mg by mouth daily.     doxycycline  (VIBRA -TABS) 100 MG tablet Take 1 tablet (100 mg total) by mouth 2 (two) times daily. 10 tablet 0   ELIQUIS  2.5 MG TABS tablet TAKE 1 TABLET BY MOUTH TWICE A DAY 180 tablet 1   isosorbide  mononitrate (IMDUR ) 60 MG 24 hr tablet Take 1.5 tablets (90 mg total) by mouth daily. (Patient taking differently: Take 90 mg by mouth daily. May take an additional 1/2 tablet (30mg ) at bedtime as needed for hypertension) 135 tablet 3   olmesartan (BENICAR) 5 MG tablet Take 5 mg by mouth daily.     rosuvastatin  (CRESTOR ) 20 MG tablet TAKE 1 TABLET  BY MOUTH AT BEDTIME. SCHEDULE PHYSICAL EXAM 90 tablet 1   torsemide  (DEMADEX ) 20 MG tablet TAKE 1 TABLET BY MOUTH TWICE A DAY ON MONDAYS AND THURSDAYS ONLY, TAKE 1 TABLET BY MOUTH DAILY ONLY ALL OTHER DAYS     ACCU-CHEK GUIDE test strip 1 EACH BY OTHER ROUTE 3 (THREE) TIMES DAILY AS NEEDED FOR OTHER. USE AS INSTRUCTED 100 strip 6   Continuous Glucose Receiver (FREESTYLE LIBRE 2 READER) DEVI Use to check blood sugars. 1 each 0   Continuous Glucose Sensor (FREESTYLE LIBRE 2 SENSOR) MISC Apply every 14 days to check blood sugars. 6 each 1   hydrALAZINE  (APRESOLINE ) 100 MG tablet TAKE 1 TABLET BY MOUTH THREE TIMES A DAY  270 tablet 2   Lancets (ONETOUCH ULTRASOFT) lancets Use as instructed 100 each 12     Discharge Medications: Please see discharge summary for a list of discharge medications.  Relevant Imaging Results:  Relevant Lab Results:   Additional Information SS#: [id number]  Dexter [place], LCSW

## 2024-03-26 DIAGNOSIS — N184 Chronic kidney disease, stage 4 (severe): Secondary | ICD-10-CM | POA: Diagnosis not present

## 2024-03-26 DIAGNOSIS — E1122 Type 2 diabetes mellitus with diabetic chronic kidney disease: Secondary | ICD-10-CM | POA: Diagnosis not present

## 2024-03-26 DIAGNOSIS — J189 Pneumonia, unspecified organism: Secondary | ICD-10-CM | POA: Diagnosis not present

## 2024-03-26 DIAGNOSIS — I4891 Unspecified atrial fibrillation: Secondary | ICD-10-CM | POA: Diagnosis not present

## 2024-03-26 DIAGNOSIS — I509 Heart failure, unspecified: Secondary | ICD-10-CM | POA: Diagnosis not present

## 2024-03-27 DIAGNOSIS — E1122 Type 2 diabetes mellitus with diabetic chronic kidney disease: Secondary | ICD-10-CM | POA: Diagnosis not present

## 2024-03-27 DIAGNOSIS — J189 Pneumonia, unspecified organism: Secondary | ICD-10-CM | POA: Diagnosis not present

## 2024-03-27 DIAGNOSIS — I509 Heart failure, unspecified: Secondary | ICD-10-CM | POA: Diagnosis not present

## 2024-03-27 DIAGNOSIS — I4891 Unspecified atrial fibrillation: Secondary | ICD-10-CM | POA: Diagnosis not present

## 2024-03-27 DIAGNOSIS — N184 Chronic kidney disease, stage 4 (severe): Secondary | ICD-10-CM | POA: Diagnosis not present

## 2024-03-28 DIAGNOSIS — I509 Heart failure, unspecified: Secondary | ICD-10-CM | POA: Diagnosis not present

## 2024-03-28 DIAGNOSIS — I4891 Unspecified atrial fibrillation: Secondary | ICD-10-CM | POA: Diagnosis not present

## 2024-03-28 DIAGNOSIS — J189 Pneumonia, unspecified organism: Secondary | ICD-10-CM | POA: Diagnosis not present

## 2024-03-28 DIAGNOSIS — E1122 Type 2 diabetes mellitus with diabetic chronic kidney disease: Secondary | ICD-10-CM | POA: Diagnosis not present

## 2024-03-28 DIAGNOSIS — N184 Chronic kidney disease, stage 4 (severe): Secondary | ICD-10-CM | POA: Diagnosis not present

## 2024-03-30 ENCOUNTER — Other Ambulatory Visit: Payer: Self-pay | Admitting: Nurse Practitioner

## 2024-03-31 DIAGNOSIS — E875 Hyperkalemia: Secondary | ICD-10-CM | POA: Diagnosis not present

## 2024-03-31 DIAGNOSIS — J189 Pneumonia, unspecified organism: Secondary | ICD-10-CM | POA: Diagnosis not present

## 2024-03-31 DIAGNOSIS — I4891 Unspecified atrial fibrillation: Secondary | ICD-10-CM | POA: Diagnosis not present

## 2024-03-31 DIAGNOSIS — N184 Chronic kidney disease, stage 4 (severe): Secondary | ICD-10-CM | POA: Diagnosis not present

## 2024-03-31 DIAGNOSIS — E1122 Type 2 diabetes mellitus with diabetic chronic kidney disease: Secondary | ICD-10-CM | POA: Diagnosis not present

## 2024-03-31 NOTE — Telephone Encounter (Signed)
 I see he was in the ed recently. I do not see insulin  on his list any longer. Is he still taking it?

## 2024-04-01 ENCOUNTER — Other Ambulatory Visit: Payer: Self-pay

## 2024-04-01 DIAGNOSIS — I5032 Chronic diastolic (congestive) heart failure: Secondary | ICD-10-CM | POA: Diagnosis not present

## 2024-04-01 DIAGNOSIS — R278 Other lack of coordination: Secondary | ICD-10-CM | POA: Diagnosis not present

## 2024-04-01 DIAGNOSIS — I480 Paroxysmal atrial fibrillation: Secondary | ICD-10-CM | POA: Diagnosis not present

## 2024-04-01 DIAGNOSIS — M6281 Muscle weakness (generalized): Secondary | ICD-10-CM | POA: Diagnosis not present

## 2024-04-01 DIAGNOSIS — R2689 Other abnormalities of gait and mobility: Secondary | ICD-10-CM | POA: Diagnosis not present

## 2024-04-01 NOTE — Transitions of Care (Post Inpatient/ED Visit) (Signed)
   04/01/2024  Name: Julion Gatt Hemsley MRN: [id number] DOB: May 19, 1941  Spoke with patient's son, [employer] on [employer], HIPAA provided.  Shawn states patient is currently at [facility] for SNF rehab and is not sure when and if he will be returning home.  [place], RN, BSN, CCM [facility], [facility] RN Care Manager Direct Dial: [phone]

## 2024-04-02 DIAGNOSIS — I4891 Unspecified atrial fibrillation: Secondary | ICD-10-CM | POA: Diagnosis not present

## 2024-04-02 DIAGNOSIS — R0989 Other specified symptoms and signs involving the circulatory and respiratory systems: Secondary | ICD-10-CM | POA: Diagnosis not present

## 2024-04-02 DIAGNOSIS — R0602 Shortness of breath: Secondary | ICD-10-CM | POA: Diagnosis not present

## 2024-04-02 DIAGNOSIS — E875 Hyperkalemia: Secondary | ICD-10-CM | POA: Diagnosis not present

## 2024-04-02 DIAGNOSIS — J189 Pneumonia, unspecified organism: Secondary | ICD-10-CM | POA: Diagnosis not present

## 2024-04-02 DIAGNOSIS — Z5189 Encounter for other specified aftercare: Secondary | ICD-10-CM | POA: Diagnosis not present

## 2024-04-02 DIAGNOSIS — N184 Chronic kidney disease, stage 4 (severe): Secondary | ICD-10-CM | POA: Diagnosis not present

## 2024-04-03 DIAGNOSIS — N184 Chronic kidney disease, stage 4 (severe): Secondary | ICD-10-CM | POA: Diagnosis not present

## 2024-04-03 DIAGNOSIS — E860 Dehydration: Secondary | ICD-10-CM | POA: Diagnosis not present

## 2024-04-03 DIAGNOSIS — N179 Acute kidney failure, unspecified: Secondary | ICD-10-CM | POA: Diagnosis not present

## 2024-04-04 ENCOUNTER — Encounter (HOSPITAL_COMMUNITY): Payer: Self-pay

## 2024-04-04 ENCOUNTER — Observation Stay (HOSPITAL_COMMUNITY)
Admission: EM | Admit: 2024-04-04 | Discharge: 2024-04-07 | Disposition: A | Discharge status: Skilled Nursing Facility | Attending: Family Medicine | Admitting: Family Medicine

## 2024-04-04 ENCOUNTER — Emergency Department (HOSPITAL_COMMUNITY)

## 2024-04-04 ENCOUNTER — Other Ambulatory Visit: Payer: Self-pay

## 2024-04-04 DIAGNOSIS — E875 Hyperkalemia: Secondary | ICD-10-CM | POA: Diagnosis not present

## 2024-04-04 DIAGNOSIS — E1122 Type 2 diabetes mellitus with diabetic chronic kidney disease: Secondary | ICD-10-CM | POA: Diagnosis not present

## 2024-04-04 DIAGNOSIS — N183 Chronic kidney disease, stage 3 unspecified: Secondary | ICD-10-CM | POA: Insufficient documentation

## 2024-04-04 DIAGNOSIS — I2510 Atherosclerotic heart disease of native coronary artery without angina pectoris: Secondary | ICD-10-CM | POA: Diagnosis not present

## 2024-04-04 DIAGNOSIS — R531 Weakness: Secondary | ICD-10-CM

## 2024-04-04 DIAGNOSIS — Z87891 Personal history of nicotine dependence: Secondary | ICD-10-CM | POA: Diagnosis not present

## 2024-04-04 DIAGNOSIS — I130 Hypertensive heart and chronic kidney disease with heart failure and stage 1 through stage 4 chronic kidney disease, or unspecified chronic kidney disease: Secondary | ICD-10-CM | POA: Insufficient documentation

## 2024-04-04 DIAGNOSIS — I5032 Chronic diastolic (congestive) heart failure: Secondary | ICD-10-CM | POA: Insufficient documentation

## 2024-04-04 DIAGNOSIS — N184 Chronic kidney disease, stage 4 (severe): Secondary | ICD-10-CM | POA: Diagnosis not present

## 2024-04-04 DIAGNOSIS — R262 Difficulty in walking, not elsewhere classified: Secondary | ICD-10-CM | POA: Insufficient documentation

## 2024-04-04 DIAGNOSIS — Z950 Presence of cardiac pacemaker: Secondary | ICD-10-CM | POA: Diagnosis not present

## 2024-04-04 DIAGNOSIS — N289 Disorder of kidney and ureter, unspecified: Secondary | ICD-10-CM | POA: Diagnosis not present

## 2024-04-04 DIAGNOSIS — I480 Paroxysmal atrial fibrillation: Secondary | ICD-10-CM | POA: Diagnosis not present

## 2024-04-04 DIAGNOSIS — Z794 Long term (current) use of insulin: Secondary | ICD-10-CM | POA: Diagnosis not present

## 2024-04-04 DIAGNOSIS — D500 Iron deficiency anemia secondary to blood loss (chronic): Secondary | ICD-10-CM | POA: Insufficient documentation

## 2024-04-04 DIAGNOSIS — E785 Hyperlipidemia, unspecified: Secondary | ICD-10-CM | POA: Diagnosis present

## 2024-04-04 DIAGNOSIS — C801 Malignant (primary) neoplasm, unspecified: Secondary | ICD-10-CM | POA: Diagnosis present

## 2024-04-04 DIAGNOSIS — Z85048 Personal history of other malignant neoplasm of rectum, rectosigmoid junction, and anus: Secondary | ICD-10-CM | POA: Diagnosis not present

## 2024-04-04 DIAGNOSIS — Z8673 Personal history of transient ischemic attack (TIA), and cerebral infarction without residual deficits: Secondary | ICD-10-CM

## 2024-04-04 DIAGNOSIS — Z7901 Long term (current) use of anticoagulants: Secondary | ICD-10-CM | POA: Insufficient documentation

## 2024-04-04 DIAGNOSIS — R0602 Shortness of breath: Secondary | ICD-10-CM | POA: Diagnosis not present

## 2024-04-04 DIAGNOSIS — E119 Type 2 diabetes mellitus without complications: Secondary | ICD-10-CM

## 2024-04-04 DIAGNOSIS — I5030 Unspecified diastolic (congestive) heart failure: Secondary | ICD-10-CM | POA: Diagnosis present

## 2024-04-04 DIAGNOSIS — R079 Chest pain, unspecified: Secondary | ICD-10-CM | POA: Diagnosis not present

## 2024-04-04 DIAGNOSIS — Z79899 Other long term (current) drug therapy: Secondary | ICD-10-CM | POA: Insufficient documentation

## 2024-04-04 DIAGNOSIS — N179 Acute kidney failure, unspecified: Secondary | ICD-10-CM | POA: Diagnosis present

## 2024-04-04 DIAGNOSIS — I10 Essential (primary) hypertension: Secondary | ICD-10-CM | POA: Diagnosis present

## 2024-04-04 DIAGNOSIS — R630 Anorexia: Secondary | ICD-10-CM | POA: Diagnosis not present

## 2024-04-04 LAB — CBC WITH DIFFERENTIAL/PLATELET
Abs Immature Granulocytes: 0.05 10*3/uL (ref 0.00–0.07)
Basophils Absolute: 0.1 10*3/uL (ref 0.0–0.1)
Basophils Relative: 1 %
Eosinophils Absolute: 0.1 10*3/uL (ref 0.0–0.5)
Eosinophils Relative: 1 %
HCT: 34.6 % — ABNORMAL LOW (ref 39.0–52.0)
Hemoglobin: 10.5 g/dL — ABNORMAL LOW (ref 13.0–17.0)
Immature Granulocytes: 0 %
Lymphocytes Relative: 12 %
Lymphs Abs: 1.4 10*3/uL (ref 0.7–4.0)
MCH: 26.8 pg (ref 26.0–34.0)
MCHC: 30.3 g/dL (ref 30.0–36.0)
MCV: 88.3 fL (ref 80.0–100.0)
Monocytes Absolute: 1 10*3/uL (ref 0.1–1.0)
Monocytes Relative: 9 %
Neutro Abs: 8.9 10*3/uL — ABNORMAL HIGH (ref 1.7–7.7)
Neutrophils Relative %: 77 %
Platelets: 298 10*3/uL (ref 150–400)
RBC: 3.92 MIL/uL — ABNORMAL LOW (ref 4.22–5.81)
RDW: 14.2 % (ref 11.5–15.5)
WBC: 11.5 10*3/uL — ABNORMAL HIGH (ref 4.0–10.5)
nRBC: 0 % (ref 0.0–0.2)

## 2024-04-04 LAB — COMPREHENSIVE METABOLIC PANEL WITH GFR
ALT: 16 U/L (ref 0–44)
AST: 28 U/L (ref 15–41)
Albumin: 2.5 g/dL — ABNORMAL LOW (ref 3.5–5.0)
Alkaline Phosphatase: 130 U/L — ABNORMAL HIGH (ref 38–126)
Anion gap: 12 (ref 5–15)
BUN: 87 mg/dL — ABNORMAL HIGH (ref 8–23)
CO2: 16 mmol/L — ABNORMAL LOW (ref 22–32)
Calcium: 8.5 mg/dL — ABNORMAL LOW (ref 8.9–10.3)
Chloride: 115 mmol/L — ABNORMAL HIGH (ref 98–111)
Creatinine, Ser: 3.51 mg/dL — ABNORMAL HIGH (ref 0.61–1.24)
GFR, Estimated: 17 mL/min — ABNORMAL LOW (ref >60)
Glucose, Bld: 120 mg/dL — ABNORMAL HIGH (ref 70–99)
Potassium: 6 mmol/L — ABNORMAL HIGH (ref 3.5–5.1)
Sodium: 143 mmol/L (ref 135–145)
Total Bilirubin: 0.9 mg/dL (ref 0.0–1.2)
Total Protein: 6.5 g/dL (ref 6.5–8.1)

## 2024-04-04 LAB — TROPONIN I (HIGH SENSITIVITY)
Troponin I (High Sensitivity): 18 ng/L — ABNORMAL HIGH (ref <18)
Troponin I (High Sensitivity): 17 ng/L (ref <18)

## 2024-04-04 LAB — BRAIN NATRIURETIC PEPTIDE: B Natriuretic Peptide: 125.4 pg/mL — ABNORMAL HIGH (ref 0.0–100.0)

## 2024-04-04 MED ORDER — HYDRALAZINE HCL 50 MG PO TABS
100.0000 mg | ORAL_TABLET | Freq: Three times a day (TID) | ORAL | Status: DC
  Administered 2024-04-04: 100 mg via ORAL
  Filled 2024-04-04 (×2): qty 2

## 2024-04-04 MED ORDER — ROSUVASTATIN CALCIUM 20 MG PO TABS
20.0000 mg | ORAL_TABLET | Freq: Every day | ORAL | Status: DC
  Administered 2024-04-04: 20 mg via ORAL
  Filled 2024-04-04 (×2): qty 1

## 2024-04-04 MED ORDER — APIXABAN 2.5 MG PO TABS
2.5000 mg | ORAL_TABLET | Freq: Two times a day (BID) | ORAL | Status: DC
  Administered 2024-04-04 – 2024-04-07 (×6): 2.5 mg via ORAL
  Filled 2024-04-04 (×6): qty 1

## 2024-04-04 MED ORDER — SODIUM ZIRCONIUM CYCLOSILICATE 10 G PO PACK
10.0000 g | PACK | Freq: Once | ORAL | Status: AC
  Administered 2024-04-04: 10 g via ORAL
  Filled 2024-04-04: qty 1

## 2024-04-04 MED ORDER — HYDRALAZINE HCL 20 MG/ML IJ SOLN: 10.0000 mg | Freq: Four times a day (QID) | INTRAMUSCULAR | Status: DC | PRN

## 2024-04-04 MED ORDER — ACETAMINOPHEN 325 MG PO TABS: 650.0000 mg | ORAL_TABLET | Freq: Four times a day (QID) | ORAL | Status: DC | PRN

## 2024-04-04 MED ORDER — DAPAGLIFLOZIN PROPANEDIOL 10 MG PO TABS
10.0000 mg | ORAL_TABLET | Freq: Every day | ORAL | Status: DC
  Filled 2024-04-04: qty 1

## 2024-04-04 MED ORDER — ONDANSETRON HCL 4 MG PO TABS: 4.0000 mg | ORAL_TABLET | Freq: Four times a day (QID) | ORAL | Status: DC | PRN

## 2024-04-04 MED ORDER — SODIUM CHLORIDE 0.9 % IV BOLUS
250.0000 mL | Freq: Once | INTRAVENOUS | Status: AC
  Administered 2024-04-04: 250 mL via INTRAVENOUS

## 2024-04-04 MED ORDER — ISOSORBIDE MONONITRATE ER 60 MG PO TB24
90.0000 mg | ORAL_TABLET | Freq: Every day | ORAL | Status: DC
  Administered 2024-04-05 – 2024-04-07 (×3): 90 mg via ORAL
  Filled 2024-04-04 (×3): qty 1

## 2024-04-04 NOTE — ED Provider Triage Note (Signed)
 Emergency Medicine Provider Triage Evaluation Note  Jake Gross , a 83 y.o. male  was evaluated in triage.  Pt complains of can't breathe since two days ago. Also hurting in his neck starting today. 5/10 pain.  Review of Systems  Positive: SOB, neck pain Negative: Swelling in the legs, chest pain  Physical Exam  BP (!) 133/103 (BP Location: Right Arm)   Pulse 74   Ht 5' 7 (1.702 m)   Wt 81.6 kg   SpO2 100%   BMI 28.19 kg/m  Gen:   Awake, no distress, appears uncomfortable Resp:  Normal effort, CTAB Heart:  RRR, no murmurs/rubs/gallops MSK:   Moves extremities without difficulty, no LEE  Medical Decision Making  Medically screening exam initiated at 1:57 PM.  Appropriate orders placed.  Jake Gross was informed that the remainder of the evaluation will be completed by another provider, this initial triage assessment does not replace that evaluation, and the importance of remaining in the ED until their evaluation is complete.    Merdis Stalling, MD 04/04/24 1400

## 2024-04-04 NOTE — ED Provider Notes (Signed)
 [place] EMERGENCY DEPARTMENT AT [facility] Provider Note   CSN: [id number] Arrival date & time: 04/04/24  1308     Patient presents with: Chest Pain   Jake Gross is a 83 y.o. male presents emergency department with a chief complaint of shortness of breath.  Patient states he has been short of breath for the last 4 days, and that is getting worse.  Patient states that at home he lives by himself and is ambulatory around his home with a cane and walker.  Patient states that it was getting harder for him to get around so that is why he came to the emergency department.  Patient also states that associated with the shortness of breath he does have some central chest pain.  Denies fever, chills, cough, nausea, vomiting.  Denies leg or ankle swelling.    Chest Pain      Prior to Admission medications   Medication Sig Start Date End Date Taking? Authorizing Provider  acetaminophen  (TYLENOL ) 325 MG tablet Take 2 tablets (650 mg total) by mouth every 6 (six) hours as needed for mild pain (pain score 1-3) (or Fever >/= 101). 03/25/24  Yes Emil Share, DO  apixaban  (ELIQUIS ) 2.5 MG TABS tablet Take 1 tablet (2.5 mg total) by mouth 2 (two) times daily. 03/25/24  Yes Emil Share, DO  dapagliflozin  propanediol (FARXIGA ) 10 MG TABS tablet Take 10 mg by mouth daily. 03/25/24  Yes [provider]  hydrALAZINE  (APRESOLINE ) 100 MG tablet TAKE 1 TABLET BY MOUTH THREE TIMES A DAY 03/25/24  Yes Weaver, Scott T, PA-C  isosorbide  mononitrate (IMDUR ) 60 MG 24 hr tablet Take 1.5 tablets (90 mg total) by mouth daily. 03/25/24 06/23/24 Yes Emil Share, DO  olmesartan  (BENICAR ) 5 MG tablet Take 1 tablet (5 mg total) by mouth daily. 03/25/24  Yes Emil Share, DO  ondansetron  (ZOFRAN ) 4 MG tablet Take 4 mg by mouth every 6 (six) hours as needed for nausea or vomiting. 03/28/24  Yes [provider]  rosuvastatin  (CRESTOR ) 20 MG tablet TAKE 1 TABLET BY MOUTH AT BEDTIME. SCHEDULE PHYSICAL EXAM  03/25/24  Yes Emil Share, DO  ACCU-CHEK GUIDE test strip 1 EACH BY OTHER ROUTE 3 (THREE) TIMES DAILY AS NEEDED FOR OTHER. USE AS INSTRUCTED 04/23/23   Wendee Lynwood HERO, NP  Continuous Glucose Receiver (FREESTYLE LIBRE 2 READER) DEVI Use to check blood sugars. 03/30/23   Wendee Lynwood HERO, NP  Continuous Glucose Sensor (FREESTYLE LIBRE 2 SENSOR) MISC Apply every 14 days to check blood sugars. 03/30/23   Wendee Lynwood HERO, NP  dapagliflozin  propanediol (FARXIGA ) 5 MG TABS tablet Take 2 tablets (10 mg total) by mouth daily. Patient not taking: Reported on 04/04/2024 03/25/24   Emil Share, DO  doxycycline  (VIBRA -TABS) 100 MG tablet Take 1 tablet (100 mg total) by mouth 2 (two) times daily. Patient not taking: Reported on 04/04/2024 03/23/24   Odell Celinda Balo, MD  Lancets [facility] ULTRASOFT) lancets Use as instructed 04/27/21   Kassie Mallick, MD  torsemide  (DEMADEX ) 20 MG tablet TAKE 1 TABLET BY MOUTH TWICE A DAY ON MONDAYS AND THURSDAYS ONLY, TAKE 1 TABLET BY MOUTH DAILY ONLY ALL OTHER DAYS Patient not taking: Reported on 04/04/2024 03/25/24   Emil Share, DO    Allergies: Patient has no known allergies.    Review of Systems  Cardiovascular:  Positive for chest pain.    Updated Vital Signs BP (!) 139/92 (BP Location: Right Arm)   Pulse 80   Temp 97.7 F (  36.5 C) (Oral)   Resp 14   Ht 5' 7 (1.702 m)   Wt 77.7 kg   SpO2 97%   BMI 26.83 kg/m   Physical Exam Vitals and nursing note reviewed.  Constitutional:      General: He is not in acute distress.    Appearance: He is well-developed. He is not ill-appearing, toxic-appearing or diaphoretic.  HENT:     Head: Normocephalic and atraumatic.   Eyes:     Extraocular Movements: Extraocular movements intact.    Cardiovascular:     Rate and Rhythm: Normal rate and regular rhythm.     Heart sounds: Normal heart sounds.  Pulmonary:     Effort: Pulmonary effort is normal. No tachypnea or respiratory distress.     Breath sounds: No wheezing, rhonchi or  rales.  Chest:     Chest wall: Tenderness (Central sternal tenderness present with palpation) present.   Musculoskeletal:     Right lower leg: No edema.     Left lower leg: No edema.   Skin:    General: Skin is warm and dry.     Capillary Refill: Capillary refill takes less than 2 seconds.   Neurological:     General: No focal deficit present.     Mental Status: He is alert and oriented to person, place, and time.   Psychiatric:        Mood and Affect: Mood normal.        Behavior: Behavior normal.     (all labs ordered are listed, but only abnormal results are displayed) Labs Reviewed  CBC WITH DIFFERENTIAL/PLATELET - Abnormal; Notable for the following components:      Result Value   WBC 11.5 (*)    RBC 3.92 (*)    Hemoglobin 10.5 (*)    HCT 34.6 (*)    Neutro Abs 8.9 (*)    All other components within normal limits  COMPREHENSIVE METABOLIC PANEL WITH GFR - Abnormal; Notable for the following components:   Potassium 6.0 (*)    Chloride 115 (*)    CO2 16 (*)    Glucose, Bld 120 (*)    BUN 87 (*)    Creatinine, Ser 3.51 (*)    Calcium  8.5 (*)    Albumin  2.5 (*)    Alkaline Phosphatase 130 (*)    GFR, Estimated 17 (*)    All other components within normal limits  BRAIN NATRIURETIC PEPTIDE - Abnormal; Notable for the following components:   B Natriuretic Peptide 125.4 (*)    All other components within normal limits  TROPONIN I (HIGH SENSITIVITY) - Abnormal; Notable for the following components:   Troponin I (High Sensitivity) 18 (*)    All other components within normal limits  CBC  COMPREHENSIVE METABOLIC PANEL WITH GFR  TROPONIN I (HIGH SENSITIVITY)    EKG: None  Radiology: DG Chest 2 View Result Date: 04/04/2024 CLINICAL DATA:  Shortness of breath EXAM: CHEST - 2 VIEW COMPARISON:  03/24/2024 FINDINGS: Post sternotomy changes. Electronic recording device over left chest. No acute airspace disease, pleural effusion or pneumothorax. Normal cardiac size.  IMPRESSION: No active cardiopulmonary disease. Electronically Signed   By: Luke Bun M.D.   On: 04/04/2024 17:21     Procedures   Medications Ordered in the ED  hydrALAZINE  (APRESOLINE ) tablet 100 mg (100 mg Oral Given 04/04/24 2147)  acetaminophen  (TYLENOL ) tablet 650 mg (has no administration in time range)  apixaban  (ELIQUIS ) tablet 2.5 mg (2.5 mg Oral Given 04/04/24 2147)  isosorbide  mononitrate (IMDUR ) 24 hr tablet 90 mg (has no administration in time range)  rosuvastatin  (CRESTOR ) tablet 20 mg (20 mg Oral Given 04/04/24 2147)  dapagliflozin  propanediol (FARXIGA ) tablet 10 mg (has no administration in time range)  ondansetron  (ZOFRAN ) tablet 4 mg (has no administration in time range)  hydrALAZINE  (APRESOLINE ) injection 10 mg (has no administration in time range)  sodium zirconium cyclosilicate  (LOKELMA ) packet 10 g (has no administration in time range)  insulin  aspart (novoLOG ) injection 0-15 Units (has no administration in time range)  insulin  aspart (novoLOG ) injection 0-5 Units (has no administration in time range)  sodium zirconium cyclosilicate  (LOKELMA ) packet 10 g (10 g Oral Given 04/04/24 1956)  sodium chloride  0.9 % bolus 250 mL (0 mLs Intravenous Stopped 04/04/24 2014)                                    Medical Decision Making Risk Prescription drug management. Decision regarding hospitalization.    Patient presents to the ED for concern of shortness of breath, weakness, chest pain, this involves an extensive number of treatment options, and is a complaint that carries with it a high risk of complications and morbidity.  The differential diagnosis includes infection, sepsis, ACS, pulmonary embolism, pneumonia, COPD, etc.   Co morbidities that complicate the patient evaluation  Hypertension, heart failure with preserved ejection fraction, paroxysmal atrial fibrillation, CAD, type 2 diabetes, weakness   Additional history obtained:  External records from outside  source obtained and reviewed including previous admission for weakness, shortness of breath on 03/24/2024 and 03/20/2024   Lab Tests:  I Ordered, and personally interpreted labs.  The pertinent results include: BMP 125.4, troponins flat and no significant elevation, CBC-WBC 11.5, hemoglobin 10.5, CMP-potassium 6, creatinine 3.51   Imaging Studies ordered:  I ordered imaging studies including chest x-ray I independently visualized and interpreted imaging which showed no acute reason for shortness of breath today I agree with the radiologist interpretation   Cardiac Monitoring:  The patient was maintained on a cardiac monitor.  I personally viewed and interpreted the cardiac monitored which showed an underlying rhythm of: Sinus   Medicines ordered and prescription drug management:  I have reviewed the patients home medicines and have made adjustments as needed Lokelma  ordered for hyperkalemia   Test Considered:  none   Critical Interventions:  none   Consultations Obtained:  I requested consultation with the hospitalist,  and discussed lab and imaging findings as well as pertinent plan - they recommend: Inpatient admission for ongoing diagnosis and treatment   Problem List / ED Course:  83 year old male presents emergency department with chief complaint of shortness of breath Patient has past medical history significant for paroxysmal atrial fibrillation, coronary artery disease, type 2 diabetes with chronic kidney disease, stroke, chronic anticoagulation, GI bleed, diastolic CHF. Workup started prior to my initial exam significant for flat troponins at 17 and 18, mildly increased BNP at 125.4.  C BC overall unremarkable compared to previous other than mildly elevated white blood cell count at 11.5, CMP significant for potassium of 6, chloride 115, creatinine of 3.51, GFR of 70 Compared to previous CMP this shows an increase in creatinine, decrease in GFR, and increase in  potassium Plan to obtain EKG to look for potassium significant changes on EKG, EKG ordered Chest x-ray shows no sign of active cardiopulmonary disease today, on exam lungs sound clear and equal no evidence of wheezing,  rales, or rhonchi No evidence of edema on either lower extremity Given hyperkalemia today, Lokelma  given as well as small bolus of fluids Due to patient living at home by himself, walking with walker and cane and this newfound shortness of breath and weakness decision to consult hospitalist made In the emergency department no acute cause of shortness of breath/weakness/chest pain found.  Acute life-threatening diagnoses ruled out based on lab workup today.  Further diagnosis and treatment recommended by hospitalist team. Patient admitted   Reevaluation:  After the interventions noted above, I reevaluated the patient and found that they have :stayed the same   Social Determinants of Health:  Patient lives at home and is ambulatory with walker/cane   Dispostion:  After consideration of the diagnostic results and the patients response to treatment, I feel that the patent would benefit from admission to the hospital for ongoing diagnosis and treatment.      Final diagnoses:  Shortness of breath  Hyperkalemia    ED Discharge Orders     None          Janetta Terrall FALCON, PA-C 04/05/24 0047    Neysa Caron PARAS, DO 04/05/24 1459

## 2024-04-04 NOTE — ED Triage Notes (Signed)
 Pt has been feeling weak for a few weeks. C/O CP/ dizziness/ nausea today. SHOB and cough for a few weeks, chest x -ray negative yesterday. Axox4.

## 2024-04-04 NOTE — H&P (Signed)
 History and Physical    Patient: Jake Gross FMW:[id number] DOB: 1941/10/12 DOA: 04/04/2024 DOS: the patient was seen and examined on 04/04/2024 PCP: Pcp, No  Patient coming from: Home  Chief Complaint:  Chief Complaint  Patient presents with   Chest Pain   HPI: Jake Gross is a 83 y.o. male with medical history significant of coronary artery disease, chronic diastolic heart failure, chronic kidney disease stage III, type 2 diabetes, history of GI bleed, essential hypertension, hyperlipidemia, paroxysmal atrial fibrillation on anticoagulation, history of coronary artery bypass grafting history of CVA now presents to the ER with complaint of shortness of breath.  Shortness of breath apparently has been going on for about 4 days.  Associated with mild cough.  Patient has also noted generalized weakness.  He has not been mobile as he used to do today shortness of breath.  He has been globally weak patient came to the ER where he was seen and evaluated.  He appears to be very weak and lives by himself.  Patient appears to have worsening renal function and seems to be dehydrated.  Review of Systems: As mentioned in the history of present illness. All other systems reviewed and are negative. 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   CAD (coronary artery disease) 09/29/2019   Cervical spondylosis    Chronic anticoagulation    Chronic diastolic CHF (congestive heart failure) (HCC) 12/17/2018   CKD (chronic kidney disease) 12/17/2018   Diabetes mellitus    History of GI bleed 09/29/2019   Hyperlipidemia    Hypertension    Iron deficiency anemia due to chronic blood loss    Paroxysmal atrial fibrillation (HCC) 12/17/2018   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 NO ISCHEMIA   COLONOSCOPY W/ POLYPECTOMY     5 polyps   COLONOSCOPY WITH PROPOFOL  N/A 08/29/2019   Procedure: COLONOSCOPY WITH PROPOFOL ;  Surgeon: Legrand Victory LITTIE DOUGLAS, MD;  Location: [facility] ENDOSCOPY;  Service: Gastroenterology;  Laterality: N/A;   CORONARY ARTERY BYPASS GRAFT N/A 11/26/2018   Procedure: CORONARY ARTERY BYPASS GRAFTING (CABG), ON PUMP, TIMES FOUR, USING LEFT INTERNAL MAMMARY ARTERY AND ENDOSCOPICALLY HARVESTED LEFT SAPHENOUS VEIN;  Surgeon: Lucas Dorise POUR, MD;  Location: [facility] OR;  Service: Open Heart Surgery;  Laterality: N/A;   ESOPHAGOGASTRODUODENOSCOPY (EGD) WITH PROPOFOL  N/A 08/29/2019   Procedure: ESOPHAGOGASTRODUODENOSCOPY (EGD) WITH PROPOFOL ;  Surgeon: Legrand Victory LITTIE DOUGLAS, MD;  Location: [facility] ENDOSCOPY;  Service: Gastroenterology;  Laterality: N/A;   EUS N/A 03/11/2015   Procedure: LOWER ENDOSCOPIC ULTRASOUND (EUS);  Surgeon: Toribio SHAUNNA Cedar, MD;  Location: [facility] ENDOSCOPY;  Service: Endoscopy;  Laterality: N/A;   FLEXIBLE SIGMOIDOSCOPY N/A 02/02/2015   Procedure: FLEXIBLE SIGMOIDOSCOPY;  Surgeon: Lamar JONETTA Aho, MD;  Location: [facility] ENDOSCOPY;  Service: Endoscopy;  Laterality: N/A;  ERBE   HEMOSTASIS CLIP PLACEMENT  08/29/2019   Procedure: HEMOSTASIS CLIP PLACEMENT;  Surgeon: Legrand Victory LITTIE DOUGLAS, MD;  Location: [facility] ENDOSCOPY;  Service: Gastroenterology;;   HOT HEMOSTASIS N/A 08/29/2019   Procedure: HOT HEMOSTASIS (ARGON  PLASMA COAGULATION/BICAP);  Surgeon: Legrand Victory LITTIE DOUGLAS, MD;  Location: [facility] ENDOSCOPY;  Service: Gastroenterology;  Laterality: N/A;   LEFT HEART CATH AND CORONARY ANGIOGRAPHY N/A 11/20/2018   Procedure: LEFT HEART CATH AND CORONARY ANGIOGRAPHY;  Surgeon: Verlin Lonni BIRCH, MD;  Location: [facility] INVASIVE CV LAB;  Service: Cardiovascular;  Laterality: N/A;    LOOP RECORDER INSERTION N/A 08/14/2017   Procedure: LOOP RECORDER INSERTION;  Surgeon: Kelsie Agent, MD;  Location: [facility] INVASIVE CV LAB;  Service: Cardiovascular;  Laterality: N/A;   PARTIAL PROCTECTOMY BY TEM N/A 04/08/2015   Procedure: TEM PARTIAL PROCTECTOMY OF RECTAL MASS;  Surgeon: Elspeth Schultze, MD;  Location: [facility] ORS;  Service: General;  Laterality: N/A;   POLYPECTOMY  08/29/2019   Procedure: POLYPECTOMY;  Surgeon: Legrand Victory LITTIE DOUGLAS, MD;  Location: [facility] ENDOSCOPY;  Service: Gastroenterology;;   TEE WITHOUT CARDIOVERSION N/A 06/25/2017   Procedure: TRANSESOPHAGEAL ECHOCARDIOGRAM (TEE);  Surgeon: Raford Riggs, MD;  Location: [facility] ENDOSCOPY;  Service: Cardiovascular;  Laterality: N/A;   TEE WITHOUT CARDIOVERSION N/A 11/26/2018   Procedure: TRANSESOPHAGEAL ECHOCARDIOGRAM (TEE);  Surgeon: Lucas Dorise POUR, MD;  Location: [facility] OR;  Service: Open Heart Surgery;  Laterality: N/A;   TONSILLECTOMY AND ADENOIDECTOMY     as child   Social History:  reports that he quit smoking about 29 years ago. His smoking use included cigarettes. He has never used smokeless tobacco. He reports that he does not drink alcohol and does not use drugs.  No Known Allergies  Family History  Problem Relation Age of Onset   Cancer Father        all over   Coronary artery disease Brother    Diabetes Brother    Stroke Mother    Diabetes Mother    Cancer Brother    Colon cancer Neg Hx    Esophageal cancer Neg Hx    Rectal cancer Neg Hx    Stomach cancer Neg Hx     Prior to Admission medications   Medication Sig Start Date End Date Taking? Authorizing Provider  acetaminophen  (TYLENOL ) 325 MG tablet Take 2 tablets (650 mg total) by mouth every 6 (six) hours as needed for mild pain (pain score 1-3) (or Fever >/= 101). 03/25/24  Yes Emil Share, DO  apixaban  (ELIQUIS ) 2.5 MG TABS tablet Take 1 tablet (2.5 mg total) by mouth 2 (two) times daily. 03/25/24  Yes Emil Share, DO  dapagliflozin  propanediol (FARXIGA ) 10 MG TABS  tablet Take 10 mg by mouth daily. 03/25/24  Yes [provider]  hydrALAZINE  (APRESOLINE ) 100 MG tablet TAKE 1 TABLET BY MOUTH THREE TIMES A DAY 03/25/24  Yes Lelon Hamilton T, PA-C  isosorbide  mononitrate (IMDUR ) 60 MG 24 hr tablet Take 1.5 tablets (90 mg total) by mouth daily. 03/25/24 06/23/24 Yes Emil Share, DO  olmesartan  (BENICAR ) 5 MG tablet Take 1 tablet (5 mg total) by mouth daily. 03/25/24  Yes Emil Share, DO  ondansetron  (ZOFRAN ) 4 MG tablet Take 4 mg by mouth every 6 (six) hours as needed for nausea or vomiting. 03/28/24  Yes [provider]  rosuvastatin  (CRESTOR ) 20 MG tablet TAKE 1 TABLET BY MOUTH AT BEDTIME. SCHEDULE PHYSICAL EXAM 03/25/24  Yes Emil Share, DO  ACCU-CHEK GUIDE test strip 1 EACH BY OTHER ROUTE 3 (THREE) TIMES DAILY AS NEEDED FOR OTHER. USE AS INSTRUCTED 04/23/23   Wendee Agent HERO, NP  Continuous Glucose Receiver (FREESTYLE LIBRE 2 READER) DEVI Use to check blood sugars. 03/30/23   Wendee Agent HERO, NP  Continuous Glucose Sensor (  FREESTYLE LIBRE 2 SENSOR) MISC Apply every 14 days to check blood sugars. 03/30/23   Wendee Lynwood HERO, NP  dapagliflozin  propanediol (FARXIGA ) 5 MG TABS tablet Take 2 tablets (10 mg total) by mouth daily. Patient not taking: Reported on 04/04/2024 03/25/24   Emil Share, DO  doxycycline  (VIBRA -TABS) 100 MG tablet Take 1 tablet (100 mg total) by mouth 2 (two) times daily. Patient not taking: Reported on 04/04/2024 03/23/24   Odell Celinda Balo, MD  Lancets [facility] ULTRASOFT) lancets Use as instructed 04/27/21   Kassie Mallick, MD  torsemide  (DEMADEX ) 20 MG tablet TAKE 1 TABLET BY MOUTH TWICE A DAY ON MONDAYS AND THURSDAYS ONLY, TAKE 1 TABLET BY MOUTH DAILY ONLY ALL OTHER DAYS Patient not taking: Reported on 04/04/2024 03/25/24   Emil Share, DO    Physical Exam: Vitals:   04/04/24 1311 04/04/24 1320 04/04/24 1409 04/04/24 1915  BP:  (!) 133/103  136/63  Pulse:  74  67  Resp:    15  Temp:   98.3 F (36.8 C)   TempSrc:   Axillary   SpO2:  100%   98%  Weight: 81.6 kg     Height: 5' 7 (1.702 m)      Constitutional: Acutely ill looking, NAD, calm, comfortable Eyes: PERRL, lids and conjunctivae normal ENMT: Mucous membranes are dry. Posterior pharynx clear of any exudate or lesions.Normal dentition.  Neck: normal, supple, no masses, no thyromegaly Respiratory: clear to auscultation bilaterally, no wheezing, no crackles. Normal respiratory effort. No accessory muscle use.  Cardiovascular: Regular rate and rhythm, no murmurs / rubs / gallops. No extremity edema. 2+ pedal pulses. No carotid bruits.  Abdomen: no tenderness, no masses palpated. No hepatosplenomegaly. Bowel sounds positive.  Musculoskeletal: Good range of motion, no joint swelling or tenderness, Skin: no rashes, lesions, ulcers. No induration Neurologic: CN 2-12 grossly intact. Sensation intact, DTR normal. Strength 5/5 in all 4.  Psychiatric: Normal judgment and insight. Alert and oriented x 3. Normal mood  Data Reviewed:  Sodium 143, potassium 6.0, chloride 115, CO2 16 glucose 120 BUN 87, creatinine 3.51 calcium  8.5, alkaline phos of 130, albumin  2.5 BNP 125, troponin 18.  White count 11.5, hemoglobin 10.5, chest x-ray showed no active cardiopulmonary disease  Assessment and Plan:  #1 generalized weakness: Multifactorial.  Patient appears to be dehydrated and weak.  Patient also appears to be weak from his known cardiac disease.  Will need PT and OT.  #2 AKI on CKD 3: Patient may have overdiuresis.  I am holding his diuretics.  There is no evidence of fluid overload although he is having the shortness of breath.  May even need some fluids.  Continue to monitor.  #3 paroxysmal atrial fibrillation: Rate is controlled.  Continue Eliquis .  Continue to monitor.  #4 essential hypertension: Continue with blood pressure medications.  Continue hydralazine  and Imdur   #5 diabetes: Continue sliding scale insulin .  #6 hyperlipidemia: Continue statin.  #7 hyperkalemia:  Potassium 6.0.  Will give Lokelma     Advance Care Planning:   Code Status: Full Code   Consults: None  Family Communication: No family at bedside  Severity of Illness: The appropriate patient status for this patient is OBSERVATION. Observation status is judged to be reasonable and necessary in order to provide the required intensity of service to ensure the patient's safety. The patient's presenting symptoms, physical exam findings, and initial radiographic and laboratory data in the context of their medical condition is felt to place them at decreased risk for further clinical  deterioration. Furthermore, it is anticipated that the patient will be medically stable for discharge from the hospital within 2 midnights of admission.   AuthorBETHA SIM KNOLL, MD 04/04/2024 8:46 PM  For on call review [email or web address].

## 2024-04-05 ENCOUNTER — Observation Stay (HOSPITAL_COMMUNITY)

## 2024-04-05 DIAGNOSIS — D649 Anemia, unspecified: Secondary | ICD-10-CM | POA: Diagnosis not present

## 2024-04-05 DIAGNOSIS — I129 Hypertensive chronic kidney disease with stage 1 through stage 4 chronic kidney disease, or unspecified chronic kidney disease: Secondary | ICD-10-CM | POA: Diagnosis not present

## 2024-04-05 DIAGNOSIS — R531 Weakness: Principal | ICD-10-CM

## 2024-04-05 DIAGNOSIS — N261 Atrophy of kidney (terminal): Secondary | ICD-10-CM | POA: Diagnosis not present

## 2024-04-05 DIAGNOSIS — N184 Chronic kidney disease, stage 4 (severe): Secondary | ICD-10-CM | POA: Diagnosis not present

## 2024-04-05 DIAGNOSIS — E875 Hyperkalemia: Secondary | ICD-10-CM | POA: Diagnosis not present

## 2024-04-05 DIAGNOSIS — N179 Acute kidney failure, unspecified: Secondary | ICD-10-CM | POA: Diagnosis not present

## 2024-04-05 DIAGNOSIS — R0602 Shortness of breath: Secondary | ICD-10-CM | POA: Diagnosis not present

## 2024-04-05 LAB — COMPREHENSIVE METABOLIC PANEL WITH GFR
ALT: 12 U/L (ref 0–44)
AST: 22 U/L (ref 15–41)
Albumin: 2.3 g/dL — ABNORMAL LOW (ref 3.5–5.0)
Alkaline Phosphatase: 120 U/L (ref 38–126)
Anion gap: 8 (ref 5–15)
BUN: 88 mg/dL — ABNORMAL HIGH (ref 8–23)
CO2: 18 mmol/L — ABNORMAL LOW (ref 22–32)
Calcium: 8.6 mg/dL — ABNORMAL LOW (ref 8.9–10.3)
Chloride: 119 mmol/L — ABNORMAL HIGH (ref 98–111)
Creatinine, Ser: 3.6 mg/dL — ABNORMAL HIGH (ref 0.61–1.24)
GFR, Estimated: 16 mL/min — ABNORMAL LOW (ref >60)
Glucose, Bld: 110 mg/dL — ABNORMAL HIGH (ref 70–99)
Potassium: 5.1 mmol/L (ref 3.5–5.1)
Sodium: 145 mmol/L (ref 135–145)
Total Bilirubin: 0.5 mg/dL (ref 0.0–1.2)
Total Protein: 5.8 g/dL — ABNORMAL LOW (ref 6.5–8.1)

## 2024-04-05 LAB — CBC
HCT: 32.4 % — ABNORMAL LOW (ref 39.0–52.0)
Hemoglobin: 10.2 g/dL — ABNORMAL LOW (ref 13.0–17.0)
MCH: 27.2 pg (ref 26.0–34.0)
MCHC: 31.5 g/dL (ref 30.0–36.0)
MCV: 86.4 fL (ref 80.0–100.0)
Platelets: 223 10*3/uL (ref 150–400)
RBC: 3.75 MIL/uL — ABNORMAL LOW (ref 4.22–5.81)
RDW: 14.1 % (ref 11.5–15.5)
WBC: 10.4 10*3/uL (ref 4.0–10.5)
nRBC: 0 % (ref 0.0–0.2)

## 2024-04-05 LAB — GLUCOSE, CAPILLARY
Glucose-Capillary: 126 mg/dL — ABNORMAL HIGH (ref 70–99)
Glucose-Capillary: 114 mg/dL — ABNORMAL HIGH (ref 70–99)
Glucose-Capillary: 120 mg/dL — ABNORMAL HIGH (ref 70–99)
Glucose-Capillary: 112 mg/dL — ABNORMAL HIGH (ref 70–99)
Glucose-Capillary: 115 mg/dL — ABNORMAL HIGH (ref 70–99)

## 2024-04-05 LAB — BRAIN NATRIURETIC PEPTIDE: B Natriuretic Peptide: 118.6 pg/mL — ABNORMAL HIGH (ref 0.0–100.0)

## 2024-04-05 MED ORDER — SODIUM CHLORIDE 0.9 % IV BOLUS: 250.0000 mL | Freq: Once | INTRAVENOUS | Status: DC

## 2024-04-05 MED ORDER — SODIUM ZIRCONIUM CYCLOSILICATE 10 G PO PACK
10.0000 g | PACK | Freq: Every day | ORAL | Status: DC
  Administered 2024-04-05: 10 g via ORAL
  Filled 2024-04-05: qty 1

## 2024-04-05 MED ORDER — INSULIN ASPART 100 UNIT/ML IJ SOLN: 0.0000 [IU] | Freq: Three times a day (TID) | INTRAMUSCULAR | Status: DC

## 2024-04-05 MED ORDER — SODIUM CHLORIDE 0.9 % IV SOLN: INTRAVENOUS | Status: DC

## 2024-04-05 MED ORDER — DAPAGLIFLOZIN PROPANEDIOL 10 MG PO TABS: 10.0000 mg | ORAL_TABLET | Freq: Every day | ORAL | Status: DC

## 2024-04-05 MED ORDER — INSULIN ASPART 100 UNIT/ML IJ SOLN: 0.0000 [IU] | Freq: Every day | INTRAMUSCULAR | Status: DC

## 2024-04-05 MED ORDER — SODIUM CHLORIDE 0.9 % IV SOLN: INTRAVENOUS | Status: AC

## 2024-04-05 MED ORDER — ROSUVASTATIN CALCIUM 20 MG PO TABS
20.0000 mg | ORAL_TABLET | Freq: Every day | ORAL | Status: DC
  Administered 2024-04-06 – 2024-04-07 (×2): 20 mg via ORAL
  Filled 2024-04-05 (×2): qty 1

## 2024-04-05 NOTE — Plan of Care (Signed)
 Pt labs and assessment progressing towards normal limits

## 2024-04-05 NOTE — Care Management Obs Status (Signed)
 MEDICARE OBSERVATION STATUS NOTIFICATION   Patient Details  Name: Jake Gross MRN: [id number] Date of Birth: 01/05/1941   Medicare Observation Status Notification Given:  Yes    Marval Gell, RN 04/05/2024, 2:24 PM

## 2024-04-05 NOTE — Consult Note (Signed)
 [facility] Renal Consultation Note  Requesting MD: Shahmehdi Indication for Consultation: CKD  HPI:  Jake Gross is a 83 y.o. male with past medical history significant for DM, HTN, hyyperlipidemia, CAD s/p CABG, diastolic heart failure, paroxysmal Afib as well as CKD-  looks like crt has been in the mid to high 2's- was 2.73 on 03/24/24 when presented and was held in  ER to be placed at [facility]  He presents to the ER on 6/20 with complaint of SOB/cough/continued weakness.  Labs showed some A on CRF-  crt 3.5.  He is noted to possibly be on farxiga , olmesartan  and demedex as OP-  initial BP soft- also some bradycardia.  He has been admitted -  ARB and diuretics held but farxiga  continued. K had been high at 6 but down to 5.1 today.  However, crt not better at 3.6 so we are asked to see-  800 of UOP recorded.  No recent urine but on 6/10 100 of protein and no cells.  Renal ultrasound just c/w medical renal dz-   no hydro.   He is alert, watching baseball on TV-  only c/o weakness-  says he goes to [facility] ??  Creatinine  Date/Time Value Ref Range Status  05/02/2022 08:45 AM 2.37 (H) 0.61 - 1.24 mg/dL Final  98/74/7976 88:89 AM 2.05 (H) 0.61 - 1.24 mg/dL Final  98/75/7976 96:98 PM 2.18 (H) 0.61 - 1.24 mg/dL Final   Creat  Date/Time Value Ref Range Status  01/25/2024 03:49 PM 2.43 (H) 0.70 - 1.22 mg/dL Final  87/72/7975 96:51 PM 2.30 (H) 0.70 - 1.22 mg/dL Final  98/86/7976 96:89 PM 2.33 (H) 0.70 - 1.28 mg/dL Final  90/87/7977 87:47 PM 1.88 (H) 0.70 - 1.28 mg/dL Final  90/87/7977 87:47 PM 1.88 (H) 0.70 - 1.28 mg/dL Final  98/76/7981 96:85 PM 1.14 0.70 - 1.18 mg/dL Final    Comment:      For patients > or = 80 years of age: The upper reference limit for Creatinine is approximately 13% higher for people identified as African-American.     09/14/2015 11:39 AM 1.01 0.70 - 1.18 mg/dL Final   Creatinine, Ser  Date/Time Value Ref Range Status  04/05/2024 03:10  AM 3.60 (H) 0.61 - 1.24 mg/dL Final  93/79/7974 98:41 PM 3.51 (H) 0.61 - 1.24 mg/dL Final  93/90/7974 95:89 PM 2.73 (H) 0.61 - 1.24 mg/dL Final  93/91/7974 96:83 AM 2.86 (H) 0.61 - 1.24 mg/dL Final  93/92/7974 91:70 AM 3.05 (H) 0.61 - 1.24 mg/dL Final  93/94/7974 95:72 PM 2.66 (H) 0.61 - 1.24 mg/dL Final  93/95/7974 94:53 AM 2.39 (H) 0.61 - 1.24 mg/dL Final  93/96/7974 94:48 AM 2.57 (H) 0.61 - 1.24 mg/dL Final  93/97/7974 92:92 AM 2.53 (H) 0.61 - 1.24 mg/dL Final  93/98/7974 93:46 AM 2.56 (H) 0.61 - 1.24 mg/dL Final  94/68/7974 88:77 PM 2.74 (H) 0.61 - 1.24 mg/dL Final  94/68/7974 92:88 PM 2.77 (H) 0.61 - 1.24 mg/dL Final  92/80/7975 89:45 AM 1.69 (H) 0.76 - 1.27 mg/dL Final  94/79/7975 97:57 PM 1.76 (H) 0.40 - 1.50 mg/dL Final  95/77/7975 95:67 AM 2.26 (H) 0.61 - 1.24 mg/dL Final  95/78/7975 96:58 AM 2.35 (H) 0.61 - 1.24 mg/dL Final  95/79/7975 96:69 AM 3.11 (H) 0.61 - 1.24 mg/dL Final  95/80/7975 98:67 AM 3.40 (H) 0.61 - 1.24 mg/dL Final  95/81/7975 94:79 PM 3.50 (H) 0.61 - 1.24 mg/dL Final  95/87/7975 87:55 AM 2.71 (H) 0.61 - 1.24 mg/dL  Final  01/25/2023 04:17 AM 3.28 (H) 0.61 - 1.24 mg/dL Final  95/89/7975 92:96 PM 3.39 (H) 0.61 - 1.24 mg/dL Final  98/74/7975 95:66 PM 2.51 (H) 0.40 - 1.50 mg/dL Final  87/81/7976 97:57 AM 2.26 (H) 0.61 - 1.24 mg/dL Final  87/83/7976 98:75 AM 2.70 (H) 0.61 - 1.24 mg/dL Final  87/84/7976 97:66 AM 2.92 (H) 0.61 - 1.24 mg/dL Final  87/85/7976 96:54 AM 3.66 (H) 0.61 - 1.24 mg/dL Final  87/86/7976 89:87 PM 4.20 (H) 0.61 - 1.24 mg/dL Final  87/86/7976 89:93 PM 3.99 (H) 0.61 - 1.24 mg/dL Final  92/86/7976 97:62 PM 2.70 (H) 0.40 - 1.50 mg/dL Final  88/71/7977 98:98 PM 2.16 (H) 0.61 - 1.24 mg/dL Final  89/94/7977 96:47 PM 2.32 (H) 0.76 - 1.27 mg/dL Final  89/96/7977 89:62 AM 1.90 (H) 0.76 - 1.27 mg/dL Final  98/88/7978 89:51 AM 2.24 (H) 0.76 - 1.27 mg/dL Final  98/93/7978 97:61 PM 2.18 (H) 0.76 - 1.27 mg/dL Final  87/84/7979 87:92 PM 1.73 (H) 0.76 - 1.27  mg/dL Final  87/88/7979 88:49 AM 1.94 (H) 0.76 - 1.27 mg/dL Final  87/95/7979 87:49 PM 2.14 (H) 0.76 - 1.27 mg/dL Final  88/76/7979 87:77 PM 2.93 (H) 0.40 - 1.50 mg/dL Final  88/85/7979 94:53 AM 1.97 (H) 0.61 - 1.24 mg/dL Final  88/86/7979 94:76 AM 1.86 (H) 0.61 - 1.24 mg/dL Final  88/87/7979 93:59 AM 2.12 (H) 0.61 - 1.24 mg/dL Final  88/88/7979 88:57 AM 1.91 (H) 0.61 - 1.24 mg/dL Final  91/75/7979 88:53 AM 1.84 (H) 0.76 - 1.27 mg/dL Final  91/82/7979 96:84 PM 1.85 (H) 0.76 - 1.27 mg/dL Final  91/96/7979 98:99 PM 1.87 (H) 0.76 - 1.27 mg/dL Final  92/92/7979 88:69 AM 1.62 (H) 0.76 - 1.27 mg/dL Final  97/82/7979 96:78 AM 1.50 (H) 0.61 - 1.24 mg/dL Final  97/84/7979 97:43 AM 1.69 (H) 0.61 - 1.24 mg/dL Final  97/85/7979 96:71 AM 1.88 (H) 0.61 - 1.24 mg/dL Final  97/86/7979 95:65 AM 1.89 (H) 0.61 - 1.24 mg/dL Final  97/87/7979 95:67 PM 2.06 (H) 0.61 - 1.24 mg/dL Final     PMHx:   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    Arthritis    AVM (arteriovenous malformation) of small bowel, 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   CAD (coronary artery disease) 09/29/2019   Cervical spondylosis    Chronic anticoagulation    Chronic diastolic CHF (congestive heart failure) (HCC) 12/17/2018   CKD (chronic kidney disease) 12/17/2018   Diabetes mellitus    History of GI bleed 09/29/2019   Hyperlipidemia    Hypertension    Iron deficiency anemia due to chronic blood loss    Paroxysmal atrial fibrillation (HCC) 12/17/2018   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 NO ISCHEMIA   COLONOSCOPY W/ POLYPECTOMY     5 polyps   COLONOSCOPY WITH PROPOFOL  N/A 08/29/2019    Procedure: COLONOSCOPY WITH PROPOFOL ;  Surgeon: Legrand Victory LITTIE DOUGLAS, MD;  Location: [facility] ENDOSCOPY;  Service: Gastroenterology;  Laterality: N/A;   CORONARY ARTERY BYPASS GRAFT N/A 11/26/2018   Procedure: CORONARY ARTERY BYPASS GRAFTING (CABG), ON PUMP, TIMES FOUR, USING LEFT INTERNAL MAMMARY  ARTERY AND ENDOSCOPICALLY HARVESTED LEFT SAPHENOUS VEIN;  Surgeon: Lucas Dorise POUR, MD;  Location: [facility] OR;  Service: Open Heart Surgery;  Laterality: N/A;   ESOPHAGOGASTRODUODENOSCOPY (EGD) WITH PROPOFOL  N/A 08/29/2019   Procedure: ESOPHAGOGASTRODUODENOSCOPY (EGD) WITH PROPOFOL ;  Surgeon: Legrand Victory LITTIE DOUGLAS, MD;  Location: [facility] ENDOSCOPY;  Service: Gastroenterology;  Laterality: N/A;   EUS N/A 03/11/2015   Procedure: LOWER ENDOSCOPIC ULTRASOUND (EUS);  Surgeon: Toribio SHAUNNA Cedar, MD;  Location: [facility];  Service: Endoscopy;  Laterality: N/A;   FLEXIBLE SIGMOIDOSCOPY N/A 02/02/2015   Procedure: FLEXIBLE SIGMOIDOSCOPY;  Surgeon: Lamar JONETTA Aho, MD;  Location: [facility] ENDOSCOPY;  Service: Endoscopy;  Laterality: N/A;  ERBE   HEMOSTASIS CLIP PLACEMENT  08/29/2019   Procedure: HEMOSTASIS CLIP PLACEMENT;  Surgeon: Legrand Victory LITTIE DOUGLAS, MD;  Location: [facility] ENDOSCOPY;  Service: Gastroenterology;;   HOT HEMOSTASIS N/A 08/29/2019   Procedure: HOT HEMOSTASIS (ARGON PLASMA COAGULATION/BICAP);  Surgeon: Legrand Victory LITTIE DOUGLAS, MD;  Location: [facility] ENDOSCOPY;  Service: Gastroenterology;  Laterality: N/A;   LEFT HEART CATH AND CORONARY ANGIOGRAPHY N/A 11/20/2018   Procedure: LEFT HEART CATH AND CORONARY ANGIOGRAPHY;  Surgeon: Verlin Lonni JONETTA, MD;  Location: [facility] INVASIVE CV LAB;  Service: Cardiovascular;  Laterality: N/A;   LOOP RECORDER INSERTION N/A 08/14/2017   Procedure: LOOP RECORDER INSERTION;  Surgeon: Kelsie Agent, MD;  Location: [facility] INVASIVE CV LAB;  Service: Cardiovascular;  Laterality: N/A;   PARTIAL PROCTECTOMY BY TEM N/A 04/08/2015   Procedure: TEM PARTIAL PROCTECTOMY OF RECTAL MASS;  Surgeon: Elspeth Schultze, MD;  Location: [facility]  ORS;  Service: General;  Laterality: N/A;   POLYPECTOMY  08/29/2019   Procedure: POLYPECTOMY;  Surgeon: Legrand Victory LITTIE DOUGLAS, MD;  Location: [facility] ENDOSCOPY;  Service: Gastroenterology;;   TEE WITHOUT CARDIOVERSION N/A 06/25/2017   Procedure: TRANSESOPHAGEAL ECHOCARDIOGRAM (TEE);  Surgeon: Raford Riggs, MD;  Location: [facility] ENDOSCOPY;  Service: Cardiovascular;  Laterality: N/A;   TEE WITHOUT CARDIOVERSION N/A 11/26/2018   Procedure: TRANSESOPHAGEAL ECHOCARDIOGRAM (TEE);  Surgeon: Lucas Dorise POUR, MD;  Location: [facility] OR;  Service: Open Heart Surgery;  Laterality: N/A;   TONSILLECTOMY AND ADENOIDECTOMY     as child    Family Hx:  Family History  Problem Relation Age of Onset   Cancer Father        all over   Coronary artery disease Brother    Diabetes Brother    Stroke Mother    Diabetes Mother    Cancer Brother    Colon cancer Neg Hx    Esophageal cancer Neg Hx    Rectal cancer Neg Hx    Stomach cancer Neg Hx     Social History:  reports that he quit smoking about 29 years ago. His smoking use included cigarettes. He has never used smokeless tobacco. He reports that he does not drink alcohol and does not use drugs.  Allergies: No Known Allergies  Medications: Prior to Admission medications   Medication Sig Start Date End Date Taking? Authorizing Provider  acetaminophen  (TYLENOL ) 325 MG tablet Take 2 tablets (650 mg total) by mouth every 6 (six) hours as needed for mild pain (pain score 1-3) (or Fever >/= 101). 03/25/24  Yes Emil Share, DO  apixaban  (ELIQUIS ) 2.5 MG TABS tablet Take 1 tablet (2.5 mg total) by mouth 2 (two) times daily. 03/25/24  Yes Emil Share, DO  dapagliflozin  propanediol (FARXIGA ) 10 MG TABS tablet Take 10 mg by mouth daily. 03/25/24  Yes [provider]  hydrALAZINE  (APRESOLINE ) 100 MG tablet TAKE 1 TABLET BY MOUTH  THREE TIMES A DAY 03/25/24  Yes Lelon Hamilton T, PA-C  isosorbide  mononitrate (IMDUR ) 60 MG 24 hr tablet Take 1.5 tablets (90 mg total) by mouth  daily. 03/25/24 06/23/24 Yes Emil Share, DO  olmesartan  (BENICAR ) 5 MG tablet Take 1 tablet (5 mg total) by mouth daily. 03/25/24  Yes Emil Share, DO  ondansetron  (ZOFRAN ) 4 MG tablet Take 4 mg by mouth every 6 (six) hours as needed for nausea or vomiting. 03/28/24  Yes [provider]  rosuvastatin  (CRESTOR ) 20 MG tablet TAKE 1 TABLET BY MOUTH AT BEDTIME. SCHEDULE PHYSICAL EXAM 03/25/24  Yes Emil Share, DO  ACCU-CHEK GUIDE test strip 1 EACH BY OTHER ROUTE 3 (THREE) TIMES DAILY AS NEEDED FOR OTHER. USE AS INSTRUCTED 04/23/23   Wendee Lynwood HERO, NP  Continuous Glucose Receiver (FREESTYLE LIBRE 2 READER) DEVI Use to check blood sugars. 03/30/23   Wendee Lynwood HERO, NP  Continuous Glucose Sensor (FREESTYLE LIBRE 2 SENSOR) MISC Apply every 14 days to check blood sugars. 03/30/23   Wendee Lynwood HERO, NP  dapagliflozin  propanediol (FARXIGA ) 5 MG TABS tablet Take 2 tablets (10 mg total) by mouth daily. Patient not taking: Reported on 04/04/2024 03/25/24   Emil Share, DO  doxycycline  (VIBRA -TABS) 100 MG tablet Take 1 tablet (100 mg total) by mouth 2 (two) times daily. Patient not taking: Reported on 04/04/2024 03/23/24   Odell Celinda Balo, MD  Lancets [facility] ULTRASOFT) lancets Use as instructed 04/27/21   Kassie Mallick, MD  torsemide  (DEMADEX ) 20 MG tablet TAKE 1 TABLET BY MOUTH TWICE A DAY ON MONDAYS AND THURSDAYS ONLY, TAKE 1 TABLET BY MOUTH DAILY ONLY ALL OTHER DAYS Patient not taking: Reported on 04/04/2024 03/25/24   Emil Share, DO    I have reviewed the patient's current medications.  Labs:  Results for orders placed or performed during the hospital encounter of 04/04/24 (from the past 48 hours)  CBC with Differential     Status: Abnormal   Collection Time: 04/04/24  1:58 PM  Result Value Ref Range   WBC 11.5 (H) 4.0 - 10.5 K/uL   RBC 3.92 (L) 4.22 - 5.81 MIL/uL   Hemoglobin 10.5 (L) 13.0 - 17.0 g/dL   HCT 65.3 (L) 60.9 - 47.9 %   MCV 88.3 80.0 - 100.0 fL   MCH 26.8 26.0 - 34.0 pg   MCHC 30.3 30.0 -  36.0 g/dL   RDW 85.7 88.4 - 84.4 %   Platelets 298 150 - 400 K/uL   nRBC 0.0 0.0 - 0.2 %   Neutrophils Relative % 77 %   Neutro Abs 8.9 (H) 1.7 - 7.7 K/uL   Lymphocytes Relative 12 %   Lymphs Abs 1.4 0.7 - 4.0 K/uL   Monocytes Relative 9 %   Monocytes Absolute 1.0 0.1 - 1.0 K/uL   Eosinophils Relative 1 %   Eosinophils Absolute 0.1 0.0 - 0.5 K/uL   Basophils Relative 1 %   Basophils Absolute 0.1 0.0 - 0.1 K/uL   Immature Granulocytes 0 %   Abs Immature Granulocytes 0.05 0.00 - 0.07 K/uL    Comment: Performed at [facility], [address] [place], [place] [address]  Comprehensive metabolic panel     Status: Abnormal   Collection Time: 04/04/24  1:58 PM  Result Value Ref Range   Sodium 143 135 - 145 mmol/L   Potassium 6.0 (H) 3.5 - 5.1 mmol/L   Chloride 115 (H) 98 - 111 mmol/L   CO2 16 (L) 22 - 32 mmol/L  Glucose, Bld 120 (H) 70 - 99 mg/dL    Comment: Glucose reference range applies only to samples taken after fasting for at least 8 hours.   BUN 87 (H) 8 - 23 mg/dL   Creatinine, Ser 6.48 (H) 0.61 - 1.24 mg/dL   Calcium  8.5 (L) 8.9 - 10.3 mg/dL   Total Protein 6.5 6.5 - 8.1 g/dL   Albumin  2.5 (L) 3.5 - 5.0 g/dL   AST 28 15 - 41 U/L   ALT 16 0 - 44 U/L   Alkaline Phosphatase 130 (H) 38 - 126 U/L   Total Bilirubin 0.9 0.0 - 1.2 mg/dL   GFR, Estimated 17 (L) >60 mL/min    Comment: (NOTE) Calculated using the CKD-EPI Creatinine Equation (2021)    Anion gap 12 5 - 15    Comment: Performed at [facility], [address] [place], [place] [address]  Brain natriuretic peptide     Status: Abnormal   Collection Time: 04/04/24  1:58 PM  Result Value Ref Range   B Natriuretic Peptide 125.4 (H) 0.0 - 100.0 pg/mL    Comment: Performed at [facility], [address] [place], [place] [address]  Troponin I (High Sensitivity)     Status: Abnormal   Collection Time: 04/04/24  1:58 PM  Result Value Ref Range   Troponin I (High Sensitivity) 18 (H) <18 ng/L     Comment: (NOTE) Elevated high sensitivity troponin I (hsTnI) values and significant  changes across serial measurements may suggest ACS but many other  chronic and acute conditions are known to elevate hsTnI results.  Refer to the Links section for chest pain algorithms and additional  guidance. Performed at [facility], [address] [place], [place] [address]   Troponin I (High Sensitivity)     Status: None   Collection Time: 04/04/24  4:01 PM  Result Value Ref Range   Troponin I (High Sensitivity) 17 <18 ng/L    Comment: (NOTE) Elevated high sensitivity troponin I (hsTnI) values and significant  changes across serial measurements may suggest ACS but many other  chronic and acute conditions are known to elevate hsTnI results.  Refer to the Links section for chest pain algorithms and additional  guidance. Performed at [facility], [address] [place], [place] [address]   Glucose, capillary     Status: Abnormal   Collection Time: 04/05/24  1:38 AM  Result Value Ref Range   Glucose-Capillary 126 (H) 70 - 99 mg/dL    Comment: Glucose reference range applies only to samples taken after fasting for at least 8 hours.  CBC     Status: Abnormal   Collection Time: 04/05/24  3:10 AM  Result Value Ref Range   WBC 10.4 4.0 - 10.5 K/uL   RBC 3.75 (L) 4.22 - 5.81 MIL/uL   Hemoglobin 10.2 (L) 13.0 - 17.0 g/dL   HCT 67.5 (L) 60.9 - 47.9 %   MCV 86.4 80.0 - 100.0 fL   MCH 27.2 26.0 - 34.0 pg   MCHC 31.5 30.0 - 36.0 g/dL   RDW 85.8 88.4 - 84.4 %   Platelets 223 150 - 400 K/uL   nRBC 0.0 0.0 - 0.2 %    Comment: Performed at [facility], [address] [place], [place] [address]  Comprehensive metabolic panel     Status: Abnormal   Collection Time: 04/05/24  3:10 AM  Result Value Ref Range   Sodium 145 135 -  145 mmol/L   Potassium 5.1 3.5 - 5.1 mmol/L   Chloride 119 (H) 98 - 111 mmol/L   CO2 18 (L) 22 - 32 mmol/L   Glucose, Bld 110 (H) 70 - 99 mg/dL     Comment: Glucose reference range applies only to samples taken after fasting for at least 8 hours.   BUN 88 (H) 8 - 23 mg/dL   Creatinine, Ser 6.39 (H) 0.61 - 1.24 mg/dL   Calcium  8.6 (L) 8.9 - 10.3 mg/dL   Total Protein 5.8 (L) 6.5 - 8.1 g/dL   Albumin  2.3 (L) 3.5 - 5.0 g/dL   AST 22 15 - 41 U/L   ALT 12 0 - 44 U/L   Alkaline Phosphatase 120 38 - 126 U/L   Total Bilirubin 0.5 0.0 - 1.2 mg/dL   GFR, Estimated 16 (L) >60 mL/min    Comment: (NOTE) Calculated using the CKD-EPI Creatinine Equation (2021)    Anion gap 8 5 - 15    Comment: Performed at [facility], [address], [place], [place] [address]  Brain natriuretic peptide     Status: Abnormal   Collection Time: 04/05/24  3:10 AM  Result Value Ref Range   B Natriuretic Peptide 118.6 (H) 0.0 - 100.0 pg/mL    Comment: Performed at [facility], [address], [place], [place] [address]  Glucose, capillary     Status: Abnormal   Collection Time: 04/05/24  6:22 AM  Result Value Ref Range   Glucose-Capillary 114 (H) 70 - 99 mg/dL    Comment: Glucose reference range applies only to samples taken after fasting for at least 8 hours.  Glucose, capillary     Status: Abnormal   Collection Time: 04/05/24 11:20 AM  Result Value Ref Range   Glucose-Capillary 120 (H) 70 - 99 mg/dL    Comment: Glucose reference range applies only to samples taken after fasting for at least 8 hours.   Comment 1 Notify RN    Comment 2 Document in Chart      ROS:  A comprehensive review of systems was negative except for: Constitutional: positive for fatigue Respiratory: positive for dyspnea on exertion  Physical Exam: Vitals:   04/05/24 1120 04/05/24 1617  BP: (!) 152/75   Pulse: 73 71  Resp:    Temp: 97.8 F (36.6 C) 97.7 F (36.5 C)  SpO2: 95% 96%     General:  older appearing WM-  NAD-  difficult bed mobility HEENT: PERRLA, EOMI, mucous membranes dry Neck: no JVD Heart: RRR Lungs: mostly clear  Abdomen: soft, non  tender Extremities: no edema  Skin: warm and dry Neuro: alert, nad-   globally weak  Assessment/Plan: 83 year old with known CKD likely due to DM, HTN and vascular dz-  now with some A on CRF in the setting of some volume depletion and soft BP on ARB, diuretic and SGLT2 1.Renal- A on CRF-  suspect is hemodynamic in the setting of volume depletion with continued ARB/SGLT2 and diuretic.  All of those meds on hold and pt getting gentle hydration-  to continue.-  no acute indications for dialysis at this time  2. Hypertension/volume  - BP meds on hold and getting IVF-   ok to continue for now  3. Hyperkalemia -  improved with medical management and holding of ARB 4. Anemia  - not too significant at this time -  suspect will go down with hydration    Sylvie Mifsud A Bryson Gavia 04/05/2024,  4:42 PM

## 2024-04-05 NOTE — Hospital Course (Addendum)
 Jake Gross is a 83 y.o. male with medical history significant of coronary artery disease, chronic diastolic heart failure, chronic kidney disease stage III, type 2 diabetes, history of GI bleed, essential hypertension, hyperlipidemia, paroxysmal atrial fibrillation on anticoagulation, history of coronary artery bypass grafting history of CVA now presents to the ER with complaint of shortness of breath.  Shortness of breath apparently has been going on for about 4 days.  Associated with mild cough.  Patient has also noted generalized weakness.  He has not been mobile as he used to do today shortness of breath.  He has been globally weak patient came to the ER where he was seen and evaluated.  He appears to be very weak and lives by himself.  Patient appears to have worsening renal function and seems to be dehydrated.     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  Dehydration, deconditioning, -Consulted PT and OT.   AKI on CKD 3: Lab Results  Component Value Date   CREATININE 3.60 (H) 04/05/2024   CREATININE 3.51 (H) 04/04/2024   CREATININE 2.73 (H) 03/24/2024   Heart failure with preserved ejection fraction Last echo 03/22/2024: EJF 60 to 65%.  Normal LV function-moderate LVH - BNP 18.6, troponin 17, Patient may have overdiuresis.  Holding diuretics- no evidence of fluid overload  Chest x-ray:No active cardiopulmonary disease.    paroxysmal atrial  fibrillation:  Rate is controlled.   Continue Eliquis .  Continue to monitor.   Essential hypertension:  Mildly hypertensive, otherwise stable Continue hydralazine  and Imdur    Diabetes: Continue sliding scale insulin .   Hyperlipidemia: Continue statin.   Hyperkalemia: Potassium 6.0.  Will give Lokelma 

## 2024-04-05 NOTE — Progress Notes (Signed)
 PROGRESS NOTE    Patient: Jake Gross                            PCP: Pcp, No                    DOB: [birth year]            DOA: 04/04/2024 [id number]             DOS: 04/05/2024, 12:17 PM   LOS: 0 days   Date of Service: The patient was seen and examined on 04/05/2024  Subjective:   The patient was seen and examined this morning. Hemodynamically stable.  Satting 95% on room air No issues overnight .  Brief Narrative:    Jake Gross is a 83 y.o. male with medical history significant of coronary artery disease, chronic diastolic heart failure, chronic kidney disease stage III, type 2 diabetes, history of GI bleed, essential hypertension, hyperlipidemia, paroxysmal atrial fibrillation on anticoagulation, history of coronary artery bypass grafting history of CVA now presents to the ER with complaint of shortness of breath.  Shortness of breath apparently has been going on for about 4 days.  Associated with mild cough.  Patient has also noted generalized weakness.  He has not been mobile as he used to do today shortness of breath.  He has been globally weak patient came to the ER where he was seen and evaluated.  He appears to be very weak and lives by himself.  Patient appears to have worsening renal function and seems to be dehydrated.     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  Dehydration, deconditioning, -Consulted PT and OT.   AKI on  CKD 3: Lab Results  Component Value Date   CREATININE 3.60 (H) 04/05/2024   CREATININE 3.51 (H) 04/04/2024   CREATININE 2.73 (H) 03/24/2024   Heart failure with preserved ejection fraction Last echo 03/22/2024: EJF 60 to 65%.  Normal LV function-moderate LVH Patient may have overdiuresis.  Holding diuretics- no evidence of fluid overload    paroxysmal atrial fibrillation:  Rate is controlled.   Continue Eliquis .  Continue to monitor.   Essential hypertension:  Mildly hypertensive, otherwise stable Continue hydralazine  and Imdur    Diabetes: Continue sliding scale insulin .   Hyperlipidemia: Continue statin.   Hyperkalemia: Potassium 6.0.  Will give Lokelma      ----------------------------------------------------------------------------------------------------------------- Nutritional status:  The patient's BMI is: Body mass index is 26.66 kg/m. I agree with the assessment and plan as outlined     DVT prophylaxis:  apixaban  (ELIQUIS ) tablet 2.5 mg Start: 04/04/24 2200 apixaban  (ELIQUIS ) tablet 2.5 mg   Code Status:   Code Status: Full Code  Family Communication: No family member present at bedside-   -Advance care planning has been discussed.   Admission status:   Status is: Observation The patient remains OBS appropriate and will d/c before 2 midnights.   Disposition: From  -  home             Planning for discharge in 1-2 days: to   Procedures:   No admission procedures for hospital encounter.   Antimicrobials:  Anti-infectives (From admission, onward)    None        Medication:   apixaban   2.5 mg Oral BID   [START ON 04/06/2024] dapagliflozin  propanediol  10 mg Oral Daily   insulin  aspart  0-15 Units Subcutaneous TID WC   insulin  aspart  0-5 Units Subcutaneous QHS   isosorbide  mononitrate  90 mg Oral Daily   [START ON 04/06/2024] rosuvastatin   20 mg Oral Daily   sodium zirconium cyclosilicate   10 g Oral Daily    acetaminophen , hydrALAZINE ,  ondansetron    Objective:   Vitals:   04/04/24 2335 04/05/24 0612 04/05/24 0819 04/05/24 1120  BP: (!) 139/92 (!) 128/58 (!) 114/52 (!) 152/75  Pulse: 80 68 63 73  Resp: 14 20    Temp: 97.7 F (36.5 C) 97.8 F (36.6 C) 97.9 F (36.6 C) 97.8 F (36.6 C)  TempSrc: Oral Oral Oral Oral  SpO2: 97% 95% 96% 95%  Weight: 77.7 kg 77.2 kg    Height: 5' 7 (1.702 m)       Intake/Output Summary (Last 24 hours) at 04/05/2024 1217 Last data filed at 04/05/2024 1136 Gross per 24 hour  Intake --  Output 600 ml  Net -600 ml   Filed Weights   04/04/24 1311 04/04/24 2335 04/05/24 0612  Weight: 81.6 kg 77.7 kg 77.2 kg     Physical examination:   Constitution:  Alert, cooperative, no distress,  Appears calm and comfortable  Psychiatric:   Normal and stable mood and affect, cognition intact,   HEENT:        Normocephalic, PERRL, otherwise with in Normal limits  Chest:         Chest symmetric Cardio vascular:  S1/S2, RRR, No murmure, No Rubs or Gallops  pulmonary: Clear to auscultation bilaterally, respirations unlabored, negative wheezes / crackles Abdomen: Soft, non-tender, non-distended, bowel sounds,no masses, no organomegaly Muscular skeletal: Limited exam - in bed, able to move all 4 extremities,   Neuro: CNII-XII intact. , normal motor and sensation, reflexes intact  Extremities: No pitting edema lower extremities, +2 pulses  Skin: Dry, warm to touch, negative for any Rashes, No open wounds Wounds: per nursing documentation   ------------------------------------------------------------------------------------------------------------------------------------------    LABs:     Latest Ref Rng & Units 04/05/2024    3:10 AM 04/04/2024    1:58 PM 03/24/2024    4:10 PM  CBC  WBC 4.0 - 10.5 K/uL 10.4  11.5  9.3   Hemoglobin 13.0 - 17.0 g/dL 89.7  89.4  9.2   Hematocrit 39.0 - 52.0 % 32.4  34.6  30.3   Platelets 150 - 400 K/uL 223  298  288       Latest Ref Rng & Units 04/05/2024     3:10 AM 04/04/2024    1:58 PM 03/24/2024    4:10 PM  CMP  Glucose 70 - 99 mg/dL 889  879  850   BUN 8 - 23 mg/dL 88  87  66   Creatinine 0.61 - 1.24 mg/dL 6.39  6.48  7.26   Sodium 135 - 145 mmol/L 145  143  142   Potassium 3.5 - 5.1 mmol/L 5.1  6.0  4.8   Chloride 98 - 111 mmol/L 119  115  114   CO2 22 - 32 mmol/L  18  16  19    Calcium  8.9 - 10.3 mg/dL 8.6  8.5  8.6   Total Protein 6.5 - 8.1 g/dL 5.8  6.5  6.4   Total Bilirubin 0.0 - 1.2 mg/dL 0.5  0.9  0.6   Alkaline Phos 38 - 126 U/L 120  130  132   AST 15 - 41 U/L 22  28  32   ALT 0 - 44 U/L 12  16  22         Micro Results No results found for this or any previous visit (from the past 240 hours).  Radiology Reports US  RENAL Result Date: 04/05/2024 CLINICAL DATA:  Acute kidney insufficiency EXAM: RENAL / URINARY TRACT ULTRASOUND COMPLETE COMPARISON:  Ultrasound 01/24/2023.  Noncontrast CT 02/01/2023 FINDINGS: Right Kidney: Renal measurements: 11.7 x 6.2 x 5.2 cm = volume: 198.7 mL. Echogenic parenchyma. Mild parenchymal global atrophy. No collecting system dilatation or perinephric fluid. Left Kidney: Renal measurements: 11.8 x 6.2 x 5.8 cm = volume: 222.3 mL. No collecting system dilatation or perinephric fluid. Global mild parenchymal atrophy. Small anechoic focus exophytic from the midportion of the kidney measures 12 mm. Bladder: Underdistended bladder. Urinary bladder wall thickening with some trabeculation. Enlarged prostate. Prostate measures 7.1 x 4.7 x 7.0 cm. Other: None. IMPRESSION: Mild bilateral renal atrophy. Right kidney appears slightly echogenic. No collecting system dilatation. Enlarged prostate.  Please correlate with any history and prior PSA Electronically Signed   By: Ranell Bring M.D.   On: 04/05/2024 11:13   DG Chest 2 View Result Date: 04/04/2024 CLINICAL DATA:  Shortness of breath EXAM: CHEST - 2 VIEW COMPARISON:  03/24/2024 FINDINGS: Post sternotomy changes. Electronic recording device over left chest. No acute  airspace disease, pleural effusion or pneumothorax. Normal cardiac size. IMPRESSION: No active cardiopulmonary disease. Electronically Signed   By: Luke Bun M.D.   On: 04/04/2024 17:21    SIGNED: Adriana DELENA Grams, MD, FHM. FAAFP. Jolynn Pack - [facility] Time spent - 55 min.  In seeing, evaluating and examining the patient. Reviewing medical records, labs, drawn plan of care. [facility],  Pager (please use [email or web address] to page/ text) Please use Epic Secure Chat for non-urgent communication (7AM-7PM)  If 7PM-7AM, please contact night-coverage [email or web address], 04/05/2024, 12:17 PM

## 2024-04-05 NOTE — H&P (Incomplete)
 History and Physical    Patient: Jake Gross WUJ:[id number] DOB: 13-Jun-1941 DOA: 04/04/2024 DOS: the patient was seen and examined on 04/04/2024 PCP: Pcp, No  Patient coming from: Home  Chief Complaint:  Chief Complaint  Patient presents with  . Chest Pain   HPI: Jake Gross is a 83 y.o. male with medical history significant of ***  Review of Systems: {ROS_Text:26778} Past Medical History:  Diagnosis Date  . 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 Arthritis   . 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 CAD (coronary artery disease) 09/29/2019  . Cervical spondylosis   . Chronic anticoagulation   . Chronic diastolic CHF (congestive heart failure) (HCC) 12/17/2018  . CKD (chronic kidney disease) 12/17/2018  . Diabetes mellitus   . History of GI bleed 09/29/2019  . Hyperlipidemia   . Hypertension   . Iron deficiency anemia due to chronic blood loss   . Paroxysmal atrial fibrillation (HCC) 12/17/2018  . 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 Vitamin D deficiency    Past Surgical History:  Procedure Laterality Date  . ABCESS DRAINAGE Left    buttocks  . CARDIOVASCULAR STRESS TEST  10/12/1999   EF 63%. NO ISCHEMIA  . COLONOSCOPY W/ POLYPECTOMY     5 polyps  . COLONOSCOPY WITH PROPOFOL  N/A 08/29/2019   Procedure: COLONOSCOPY WITH PROPOFOL ;  Surgeon: Albertina Hugger, MD;  Location: [facility] ENDOSCOPY;  Service: Gastroenterology;  Laterality: N/A;  . CORONARY ARTERY BYPASS GRAFT N/A 11/26/2018   Procedure: CORONARY ARTERY BYPASS GRAFTING (CABG), ON PUMP, TIMES FOUR, USING LEFT INTERNAL MAMMARY ARTERY AND ENDOSCOPICALLY HARVESTED LEFT SAPHENOUS VEIN;  Surgeon: Bartley Lightning, MD;  Location: [facility] OR;  Service: Open Heart Surgery;  Laterality: N/A;  . ESOPHAGOGASTRODUODENOSCOPY (EGD) WITH  PROPOFOL  N/A 08/29/2019   Procedure: ESOPHAGOGASTRODUODENOSCOPY (EGD) WITH PROPOFOL ;  Surgeon: Albertina Hugger, MD;  Location[facility] ENDOSCOPY;  Service: Gastroenterology;  Laterality: N/A;  . EUS N/A 03/11/2015   Procedure: LOWER ENDOSCOPIC ULTRASOUND (EUS);  Surgeon: Janel Medford, MD;  Location: [facility] ENDOSCOPY;  Service: Endoscopy;  Laterality: N/A;  . FLEXIBLE SIGMOIDOSCOPY N/A 02/02/2015   Procedure: FLEXIBLE SIGMOIDOSCOPY;  Surgeon: Claudette Cue, MD;  Location: [facility] ENDOSCOPY;  Service: Endoscopy;  Laterality: N/A;  ERBE  . HEMOSTASIS CLIP PLACEMENT  08/29/2019   Procedure: HEMOSTASIS CLIP PLACEMENT;  Surgeon: Albertina Hugger, MD;  Location: [facility] ENDOSCOPY;  Service: Gastroenterology;;  . HOT HEMOSTASIS N/A 08/29/2019   Procedure: HOT HEMOSTASIS (ARGON PLASMA COAGULATION/BICAP);  Surgeon: Albertina Hugger, MD;  Location: [facility] ENDOSCOPY;  Service: Gastroenterology;  Laterality: N/A;  . LEFT HEART CATH AND CORONARY ANGIOGRAPHY N/A 11/20/2018   Procedure: LEFT HEART CATH AND CORONARY ANGIOGRAPHY;  Surgeon: Odie Benne, MD;  Location: [facility] INVASIVE CV LAB;  Service: Cardiovascular;  Laterality: N/A;  . LOOP RECORDER INSERTION N/A 08/14/2017   Procedure: LOOP RECORDER INSERTION;  Surgeon: Jolly Needle, MD;  Location: [facility] INVASIVE CV LAB;  Service: Cardiovascular;  Laterality: N/A;  . PARTIAL PROCTECTOMY BY TEM N/A 04/08/2015   Procedure: TEM PARTIAL PROCTECTOMY OF RECTAL MASS;  Surgeon: Candyce Champagne, MD;  Location: [facility] ORS;  Service: General;  Laterality: N/A;  . POLYPECTOMY  08/29/2019   Procedure: POLYPECTOMY;  Surgeon: Albertina Hugger, MD;  Location: [facility] ENDOSCOPY;  Service: Gastroenterology;;  .  TEE WITHOUT CARDIOVERSION N/A 06/25/2017   Procedure: TRANSESOPHAGEAL ECHOCARDIOGRAM (TEE);  Surgeon: Maudine Sos, MD;  Location: [facility] ENDOSCOPY;  Service: Cardiovascular;  Laterality: N/A;  . TEE WITHOUT CARDIOVERSION N/A 11/26/2018   Procedure: TRANSESOPHAGEAL ECHOCARDIOGRAM (TEE);   Surgeon: Bartley Lightning, MD;  Location: [facility] OR;  Service: Open Heart Surgery;  Laterality: N/A;  . TONSILLECTOMY AND ADENOIDECTOMY     as child   Social History:  reports that he quit smoking about 29 years ago. His smoking use included cigarettes. He has never used smokeless tobacco. He reports that he does not drink alcohol and does not use drugs.  No Known Allergies  Family History  Problem Relation Age of Onset  . Cancer Father        all over  . Coronary artery disease Brother   . Diabetes Brother   . Stroke Mother   . Diabetes Mother   . Cancer Brother   . Colon cancer Neg Hx   . Esophageal cancer Neg Hx   . Rectal cancer Neg Hx   . Stomach cancer Neg Hx     Prior to Admission medications   Medication Sig Start Date End Date Taking? Authorizing Provider  acetaminophen  (TYLENOL ) 325 MG tablet Take 2 tablets (650 mg total) by mouth every 6 (six) hours as needed for mild pain (pain score 1-3) (or Fever >/= 101). 03/25/24  Yes Albertus Hughs, DO  apixaban  (ELIQUIS ) 2.5 MG TABS tablet Take 1 tablet (2.5 mg total) by mouth 2 (two) times daily. 03/25/24  Yes Albertus Hughs, DO  dapagliflozin  propanediol (FARXIGA ) 10 MG TABS tablet Take 10 mg by mouth daily. 03/25/24  Yes [provider]  hydrALAZINE  (APRESOLINE ) 100 MG tablet TAKE 1 TABLET BY MOUTH THREE TIMES A DAY 03/25/24  Yes Marlyse Single T, PA-C  isosorbide  mononitrate (IMDUR ) 60 MG 24 hr tablet Take 1.5 tablets (90 mg total) by mouth daily. 03/25/24 06/23/24 Yes Albertus Hughs, DO  olmesartan  (BENICAR ) 5 MG tablet Take 1 tablet (5 mg total) by mouth daily. 03/25/24  Yes Albertus Hughs, DO  ondansetron  (ZOFRAN ) 4 MG tablet Take 4 mg by mouth every 6 (six) hours as needed for nausea or vomiting. 03/28/24  Yes [provider]  rosuvastatin  (CRESTOR ) 20 MG tablet TAKE 1 TABLET BY MOUTH AT BEDTIME. SCHEDULE PHYSICAL EXAM 03/25/24  Yes Albertus Hughs, DO  ACCU-CHEK GUIDE test strip 1 EACH BY OTHER ROUTE 3 (THREE) TIMES DAILY AS NEEDED FOR  OTHER. USE AS INSTRUCTED 04/23/23   Dorothe Gaster, NP  Continuous Glucose Receiver (FREESTYLE LIBRE 2 READER) DEVI Use to check blood sugars. 03/30/23   Dorothe Gaster, NP  Continuous Glucose Sensor (FREESTYLE LIBRE 2 SENSOR) MISC Apply every 14 days to check blood sugars. 03/30/23   Dorothe Gaster, NP  dapagliflozin  propanediol (FARXIGA ) 5 MG TABS tablet Take 2 tablets (10 mg total) by mouth daily. Patient not taking: Reported on 04/04/2024 03/25/24   Albertus Hughs, DO  doxycycline  (VIBRA -TABS) 100 MG tablet Take 1 tablet (100 mg total) by mouth 2 (two) times daily. Patient not taking: Reported on 04/04/2024 03/23/24   Macdonald Savoy, MD  Lancets [facility] ULTRASOFT) lancets Use as instructed 04/27/21   Gwyndolyn Lerner, MD  torsemide  (DEMADEX ) 20 MG tablet TAKE 1 TABLET BY MOUTH TWICE A DAY ON MONDAYS AND THURSDAYS ONLY, TAKE 1 TABLET BY MOUTH DAILY ONLY ALL OTHER DAYS Patient not taking: Reported on 04/04/2024 03/25/24   Albertus Hughs, DO    Physical Exam: Vitals:  04/04/24 1311 04/04/24 1320 04/04/24 1409 04/04/24 1915  BP:  (!) 133/103  136/63  Pulse:  74  67  Resp:    15  Temp:   98.3 F (36.8 C)   TempSrc:   Axillary   SpO2:  100%  98%  Weight: 81.6 kg     Height: 5' 7 (1.702 m)      *** Data Reviewed: {Tip this will not be part of the note when signed- Document your independent interpretation of telemetry tracing, EKG, lab, Radiology test or any other diagnostic tests. Add any new diagnostic test ordered today. (Optional):26781} {Results:26384}  Assessment and Plan: No notes have been filed under this hospital service. Service: Hospitalist     Advance Care Planning:   Code Status: Full Code ***  Consults: ***  Family Communication: ***  Severity of Illness: {Observation/Inpatient:21159}  AuthorCarolin Chyle, MD 04/04/2024 8:46 PM  For on call review [email or web address].

## 2024-04-06 DIAGNOSIS — I129 Hypertensive chronic kidney disease with stage 1 through stage 4 chronic kidney disease, or unspecified chronic kidney disease: Secondary | ICD-10-CM | POA: Diagnosis not present

## 2024-04-06 DIAGNOSIS — E875 Hyperkalemia: Secondary | ICD-10-CM | POA: Diagnosis not present

## 2024-04-06 DIAGNOSIS — R0602 Shortness of breath: Secondary | ICD-10-CM | POA: Diagnosis not present

## 2024-04-06 DIAGNOSIS — N179 Acute kidney failure, unspecified: Secondary | ICD-10-CM | POA: Diagnosis not present

## 2024-04-06 DIAGNOSIS — R531 Weakness: Secondary | ICD-10-CM | POA: Diagnosis not present

## 2024-04-06 DIAGNOSIS — D649 Anemia, unspecified: Secondary | ICD-10-CM | POA: Diagnosis not present

## 2024-04-06 DIAGNOSIS — N184 Chronic kidney disease, stage 4 (severe): Secondary | ICD-10-CM | POA: Diagnosis not present

## 2024-04-06 LAB — GLUCOSE, CAPILLARY
Glucose-Capillary: 95 mg/dL (ref 70–99)
Glucose-Capillary: 108 mg/dL — ABNORMAL HIGH (ref 70–99)
Glucose-Capillary: 110 mg/dL — ABNORMAL HIGH (ref 70–99)
Glucose-Capillary: 105 mg/dL — ABNORMAL HIGH (ref 70–99)
Glucose-Capillary: 105 mg/dL — ABNORMAL HIGH (ref 70–99)

## 2024-04-06 LAB — BASIC METABOLIC PANEL WITH GFR
Anion gap: 8 (ref 5–15)
BUN: 77 mg/dL — ABNORMAL HIGH (ref 8–23)
CO2: 18 mmol/L — ABNORMAL LOW (ref 22–32)
Calcium: 8.1 mg/dL — ABNORMAL LOW (ref 8.9–10.3)
Chloride: 117 mmol/L — ABNORMAL HIGH (ref 98–111)
Creatinine, Ser: 3.17 mg/dL — ABNORMAL HIGH (ref 0.61–1.24)
GFR, Estimated: 19 mL/min — ABNORMAL LOW (ref >60)
Glucose, Bld: 96 mg/dL (ref 70–99)
Potassium: 4.8 mmol/L (ref 3.5–5.1)
Sodium: 143 mmol/L (ref 135–145)

## 2024-04-06 MED ORDER — SODIUM ZIRCONIUM CYCLOSILICATE 10 G PO PACK: 10.0000 g | PACK | Freq: Every day | ORAL | Status: DC

## 2024-04-06 NOTE — Evaluation (Signed)
 Occupational Therapy Evaluation Patient Details Name: Jake Gross MRN: [id number] DOB: 1941-10-15 Today's Date: 04/06/2024   History of Present Illness   Jake Gross is a 83 y.o. male who presented 04/04/24 with c/o SOB and weakness. PMHx: T2DM, HTN, HLD, CAD s/p CABG, diastolic heart failure, paroxysmal Afib, GI bleed, CVA, and CKD. Of note, recent hospitalizations for the similar presentation 6/5-6/8 with d/c Home and then 6/9-6/10 with d/c to [facility].     Clinical Impressions At baseline, pt is Independent with ADLS, Mod I with functional mobility with a RW, and driving. However, just prior to this admission, pt was receiving short-term rehab at [facility]. Pt now presents with decreased cognition, decreased activity tolerance, decreased balance, and decreased safety and independence with functional tasks. Pt currently demonstrates ability to complete UB ADLs Independent to Set up assist, LB ADLs with Contact guard assist, and functional mobility/transfers with a RW with Contact guard assist. Pt requiring cues for hand placement/technique and safety throughout session. Pt VSS on RA. Pt participated well in session and is motivated to return to PLOF. Pt will benefit from acute skilled OT services to address deficits outlined below and to increase safety and independence with functional tasks. Post acute discharge, pt will benefit from [facility] OT to maximize rehab potential and for a home safety assessment. .      If plan is discharge home, recommend the following:   A little help with walking and/or transfers;A little help with bathing/dressing/bathroom;Assistance with cooking/housework;Direct supervision/assist for medications management;Direct supervision/assist for financial management;Assist for transportation;Help with stairs or ramp for entrance     Functional Status Assessment   Patient has had a recent decline in their functional status and demonstrates the ability  to make significant improvements in function in a reasonable and predictable amount of time.     Equipment Recommendations   Tub/shower bench     Recommendations for Other Services         Precautions/Restrictions   Precautions Precautions: Fall Restrictions Weight Bearing Restrictions Per Provider Order: No     Mobility Bed Mobility Overal bed mobility: Needs Assistance Bed Mobility: Supine to Sit     Supine to sit: Contact guard     General bed mobility comments: Pt sat up from a flat bed on the L side. Pt with posterior lean, which he blamed on the mattress.    Transfers Overall transfer level: Needs assistance Equipment used: Rolling walker (2 wheels) Transfers: Sit to/from Stand, Bed to chair/wheelchair/BSC Sit to Stand: Contact guard assist, Supervision     Step pivot transfers: Contact guard assist     General transfer comment: Cues for hand placement, technique, and safety      Balance Overall balance assessment: Needs assistance Sitting-balance support: Single extremity supported, No upper extremity supported, Feet supported Sitting balance-Leahy Scale: Fair   Postural control: Posterior lean (when lifting R/L LE) Standing balance support: Single extremity supported, Bilateral upper extremity supported, No upper extremity supported, During functional activity, Reliant on assistive device for balance Standing balance-Leahy Scale: Poor Standing balance comment: Pt dependent on RW or support of therapist to maintain balance.                           ADL either performed or assessed with clinical judgement   ADL Overall ADL's : Needs assistance/impaired Eating/Feeding: Independent;Sitting   Grooming: Contact guard assist;Standing;Cueing for safety   Upper Body Bathing: Set up;Supervision/ safety;Sitting (cues for  thouroughness)   Lower Body Bathing: Contact guard assist;Sit to/from stand;Sitting/lateral leans;Cueing for safety  (cues for thouroughness)   Upper Body Dressing : Set up;Sitting   Lower Body Dressing: Contact guard assist;Sit to/from stand;Cueing for safety   Toilet Transfer: Contact guard assist;Ambulation;Regular Toilet;Grab bars;Rolling walker (2 wheels);Cueing for safety   Toileting- Clothing Manipulation and Hygiene: Contact guard assist;Sit to/from stand;Cueing for safety       Functional mobility during ADLs: Contact guard assist;Rolling walker (2 wheels);Cueing for safety       Vision Baseline Vision/History: 0 No visual deficits Ability to See in Adequate Light: 0 Adequate Patient Visual Report: No change from baseline       Perception         Praxis         Pertinent Vitals/Pain Pain Assessment Pain Assessment: No/denies pain Pain Intervention(s): Monitored during session     Extremity/Trunk Assessment Upper Extremity Assessment Upper Extremity Assessment: Right hand dominant;Overall WFL for tasks assessed RUE Deficits / Details: shoulder strength 3/5; otherwise [facility]   Lower Extremity Assessment Lower Extremity Assessment: Defer to PT evaluation   Cervical / Trunk Assessment Cervical / Trunk Assessment: Kyphotic   Communication Communication Communication: Impaired Factors Affecting Communication: Hearing impaired   Cognition Arousal: Alert Behavior During Therapy: WFL for tasks assessed/performed, Impulsive Cognition: Cognition impaired, No family/caregiver present to determine baseline     Awareness: Intellectual awareness intact, Online awareness impaired Memory impairment (select all impairments): Working memory Attention impairment (select first level of impairment): Alternating attention Executive functioning impairment (select all impairments): Problem solving, Reasoning OT - Cognition Comments: Pt AAOx4 and participated wellin session. Pt with noted decreased safety awareness and occasional impulsiveness with movement.                 Following  commands: Intact       Cueing  General Comments   Cueing Techniques: Verbal cues  VSS on RA throughout session   Exercises     Shoulder Instructions      Home Living Family/patient expects to be discharged to:: Private residence Living Arrangements: Alone Available Help at Discharge: Family;Available PRN/intermittently (son and daughter live near by) Type of Home: House Home Access: Stairs to enter Secretary/administrator of Steps: 4 Entrance Stairs-Rails: Right;Left;Can reach both Home Layout: One level     Bathroom Shower/Tub: Chief Strategy Officer: Handicapped height Bathroom Accessibility: Yes How Accessible: Accessible via walker Home Equipment: Cane - single point;Rolling Walker (2 wheels);BSC/3in1   Additional Comments: Pt was at [facility] for short-term rehab just prior to this admission      Prior Functioning/Environment Prior Level of Function : History of Falls (last six months);Independent/Modified Independent;Driving             Mobility Comments: Just prior to this admission, pt was neededing assistance for transfers/mobility at [facility] Prior to last admission, pt was performing funcitonal mobility Mod I with a RW. ADLs Comments: Reports Independent to Mod I just prior to this admission. Independent and driving prior to last admission. Per chart review, pt's son assists with IADLs    OT Problem List: Decreased activity tolerance;Impaired balance (sitting and/or standing);Decreased cognition;Decreased safety awareness   OT Treatment/Interventions: Self-care/ADL training;Therapeutic exercise;Energy conservation;DME and/or AE instruction;Therapeutic activities;Patient/family education;Balance training;Cognitive remediation/compensation      OT Goals(Current goals can be found in the care plan section)   Acute Rehab OT Goals Patient Stated Goal: to return home and not go back to short-term rehab OT Goal Formulation: With  patient Time For Goal Achievement: 04/20/24 Potential to Achieve Goals: Good ADL Goals Pt Will Perform Grooming: with modified independence;standing Pt Will Perform Lower Body Bathing: with modified independence;sitting/lateral leans;sit to/from stand Pt Will Perform Lower Body Dressing: with modified independence;sitting/lateral leans;sit to/from stand Pt Will Transfer to Toilet: with modified independence;ambulating;regular height toilet (with least restrictive AD) Pt Will Perform Toileting - Clothing Manipulation and hygiene: with modified independence;sit to/from stand;sitting/lateral leans Pt Will Perform Tub/Shower Transfer: with modified independence;ambulating;tub bench (with least restrictive AD)   OT Frequency:  Min 2X/week    Co-evaluation              AM-PAC OT 6 Clicks Daily Activity     Outcome Measure Help from another person eating meals?: None Help from another person taking care of personal grooming?: A Little Help from another person toileting, which includes using toliet, bedpan, or urinal?: A Little Help from another person bathing (including washing, rinsing, drying)?: A Little Help from another person to put on and taking off regular upper body clothing?: A Little Help from another person to put on and taking off regular lower body clothing?: A Little 6 Click Score: 19   End of Session Equipment Utilized During Treatment: Rolling walker (2 wheels);Gait belt Nurse Communication: Mobility status  Activity Tolerance: Patient tolerated treatment well Patient left: in chair;with call bell/phone within reach;with chair alarm set  OT Visit Diagnosis: Unsteadiness on feet (R26.81);Other symptoms and signs involving cognitive function                Time: 8862-8841 OT Time Calculation (min): 21 min Charges:  OT General Charges $OT Visit: 1 Visit OT Evaluation $OT Eval Low Complexity: 1 Low  Margarie Rockey HERO., OTR/L, MA Acute Rehab [phone]   Margarie FORBES Horns 04/06/2024, 4:09 PM

## 2024-04-06 NOTE — Evaluation (Signed)
 Physical Therapy Evaluation Patient Details Name: Jake Gross MRN: [id number] DOB: [birth year] Today's Date: 04/06/2024  History of Present Illness  Jake Gross is a 83 y.o. male who presented 04/04/24 with c/o SOB and weakness. PMHx: T2DM, HTN, HLD, CAD s/p CABG, diastolic heart failure, paroxysmal Afib, GI bleed, CVA, and CKD. Of note, recent hospitalizations for the similar presentation 6/5-6/8 with d/c Home and then 6/9-6/10 with d/c to [facility].   Clinical Impression  Pt admitted with above diagnosis. PTA, pt was receiving therapy services at [facility]. His PLOF is modI for functional mobility using RW and independent with ADLs/IADLs. He lives alone in a one story house with 4 STE and bilateral railings. Pt currently with functional limitations due to the deficits listed below (see PT Problem List). He required CGA for functional mobility for safety and stability. Pt ambulated ~260ft with one seated rest break and ascended/descended 5 stairs. Pt will benefit from acute skilled PT to increase his independence and safety with mobility to allow discharge. Recommend HHPT to improve activity tolerance, decrease fall risk, and optimize safety within the home environment.      If plan is discharge home, recommend the following: A little help with walking and/or transfers;A little help with bathing/dressing/bathroom;Assist for transportation;Help with stairs or ramp for entrance;Assistance with cooking/housework   Can travel by private vehicle   Yes    Equipment Recommendations None recommended by PT  Recommendations for Other Services       Functional Status Assessment Patient has had a recent decline in their functional status and demonstrates the ability to make significant improvements in function in a reasonable and predictable amount of time.     Precautions / Restrictions Precautions Precautions: Fall Recall of Precautions/Restrictions:  Intact Restrictions Weight Bearing Restrictions Per Provider Order: No      Mobility  Bed Mobility Overal bed mobility: Needs Assistance Bed Mobility: Supine to Sit     Supine to sit: Contact guard     General bed mobility comments: Pt sat up from a flat bed on the L side. He attempted to pump his legs and arms to use momentum in order to sit up. Assist to elevate trunk required. Pt maintained a posterior lean, which he blamed on the mattress.    Transfers Overall transfer level: Needs assistance Equipment used: Rolling walker (2 wheels) Transfers: Sit to/from Stand Sit to Stand: Contact guard assist, Supervision           General transfer comment: Pt stood from lowest bed height and chair. He demonstrated proper hand placement using RW. Good eccentric control with sitting.    Ambulation/Gait Ambulation/Gait assistance: Contact guard assist Gait Distance (Feet): 200 Feet (1 seated rest on chair) Assistive device: Rolling walker (2 wheels) Gait Pattern/deviations: Step-through pattern, Trunk flexed, Decreased stride length, Narrow base of support, Drifts right/left Gait velocity: decreased Gait velocity interpretation: <1.8 ft/sec, indicate of risk for recurrent falls   General Gait Details: Pt ambulated in room/hallway with RW. Pt maintained a fwd lead. He appeared to maintain his RLE in extension as he advanced and made foot flat contact. As pt fatigued he became unsteady and struggled to maintain body inside RW especially during turns. Cues for proximity to RW.  Stairs Stairs: Yes Stairs assistance: Contact guard assist Stair Management: Two rails, Forwards, Alternating pattern, Step to pattern Number of Stairs: 5 General stair comments: Pt ascended with a reciprocal gait pattern and B railings. He descended leading with RLE and B  railings. Pt turned around on the 5th step with extra time and cues.  Wheelchair Mobility     Tilt Bed    Modified Rankin (Stroke  Patients Only)       Balance Overall balance assessment: Needs assistance Sitting-balance support: Feet supported Sitting balance-Leahy Scale: Fair Sitting balance - Comments: Pt sat EOB with supervision-CGA. He engaged in LE assessment and demonstrated a posterior lean. Pt was able to reach outside his BOS.   Standing balance support: Bilateral upper extremity supported, Reliant on assistive device for balance Standing balance-Leahy Scale: Poor Standing balance comment: Pt dependent on RW.                             Pertinent Vitals/Pain Pain Assessment Pain Assessment: No/denies pain    Home Living Family/patient expects to be discharged to:: Private residence Living Arrangements: Alone Available Help at Discharge: Family;Available PRN/intermittently (son and daughter live nearby) Type of Home: House Home Access: Stairs to enter Entrance Stairs-Rails: Right;Left;Can reach both Secretary/administrator of Steps: 4   Home Layout: One level Home Equipment: Cane - single Librarian, academic (2 wheels);BSC/3in1 Additional Comments: Pt was at [facility] for short-term rehab just prior to this admission    Prior Function Prior Level of Function : History of Falls (last six months);Independent/Modified Independent;Driving             Mobility Comments: Reports recieving assists with transfers/gait at the facility. Previously was modI using RW. ADLs Comments: Reports Independent to Mod at the facility. Previously was independent, managing medications, and driving.     Extremity/Trunk Assessment   Upper Extremity Assessment Upper Extremity Assessment: Defer to OT evaluation    Lower Extremity Assessment Lower Extremity Assessment: Overall WFL for tasks assessed    Cervical / Trunk Assessment Cervical / Trunk Assessment: Kyphotic  Communication   Communication Communication: Impaired Factors Affecting Communication: Hearing impaired    Cognition  Arousal: Alert Behavior During Therapy: WFL for tasks assessed/performed   PT - Cognitive impairments: Safety/Judgement                       PT - Cognition Comments: Pt with poor safety awareness. Following commands: Intact       Cueing Cueing Techniques: Verbal cues     General Comments General comments (skin integrity, edema, etc.): VSS on RA    Exercises     Assessment/Plan    PT Assessment Patient needs continued PT services  PT Problem List Decreased activity tolerance;Decreased balance;Decreased mobility;Decreased knowledge of use of DME       PT Treatment Interventions DME instruction;Gait training;Stair training;Functional mobility training;Therapeutic exercise;Therapeutic activities;Balance training;Patient/family education    PT Goals (Current goals can be found in the Care Plan section)  Acute Rehab PT Goals Patient Stated Goal: Return Home PT Goal Formulation: With patient Time For Goal Achievement: 04/20/24 Potential to Achieve Goals: Good    Frequency Min 2X/week     Co-evaluation               AM-PAC PT 6 Clicks Mobility  Outcome Measure Help needed turning from your back to your side while in a flat bed without using bedrails?: A Little Help needed moving from lying on your back to sitting on the side of a flat bed without using bedrails?: A Little Help needed moving to and from a bed to a chair (including a wheelchair)?: A Little Help needed standing up from  a chair using your arms (e.g., wheelchair or bedside chair)?: A Little Help needed to walk in hospital room?: A Little Help needed climbing 3-5 steps with a railing? : A Little 6 Click Score: 18    End of Session Equipment Utilized During Treatment: Gait belt Activity Tolerance: Patient tolerated treatment well Patient left: in chair;with call bell/phone within reach;with chair alarm set Nurse Communication: Mobility status PT Visit Diagnosis: Difficulty in walking, not  elsewhere classified (R26.2);Unsteadiness on feet (R26.81);Other abnormalities of gait and mobility (R26.89)    Time: 8862-8841 PT Time Calculation (min) (ACUTE ONLY): 21 min   Charges:   PT Evaluation $PT Eval Low Complexity: 1 Low   PT General Charges $$ ACUTE PT VISIT: 1 Visit         Randall SAUNDERS, PT, DPT Acute Rehabilitation Services Office: [phone] Secure Chat Preferred  Delon CHRISTELLA Callander 04/06/2024, 1:48 PM

## 2024-04-06 NOTE — Progress Notes (Signed)
 PROGRESS NOTE    Patient: Jake Gross                            PCP: Pcp, No                    DOB: 05/09/1941            DOA: 04/04/2024 FMW:[id number]             DOS: 04/06/2024, 1:08 PM   LOS: 0 days   Date of Service: The patient was seen and examined on 04/06/2024  Subjective:   The patient was seen and examined this morning, stable no acute distress No issues overnight, satting 95% on room air  Current medications on hold due to AKI-patient is aware  Brief Narrative:    Jake Gross is a 83 y.o. male with medical history significant of coronary artery disease, chronic diastolic heart failure, chronic kidney disease stage III, type 2 diabetes, history of GI bleed, essential hypertension, hyperlipidemia, paroxysmal atrial fibrillation on anticoagulation, history of coronary artery bypass grafting history of CVA now presents to the ER with complaint of shortness of breath.  Shortness of breath apparently has been going on for about 4 days.  Associated with mild cough.  Patient has also noted generalized weakness.  He has not been mobile as he used to do today shortness of breath.  He has been globally weak patient came to the ER where he was seen and evaluated.  He appears to be very weak and lives by himself.  Patient appears to have worsening renal function and seems to be dehydrated.     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  Dehydration, deconditioning,  -Consulted PT and OT.   AKI on CKD 3: Lab Results  Component Value Date   CREATININE 3.17 (H) 04/06/2024   CREATININE 3.60 (H) 04/05/2024   CREATININE 3.51 (H) 04/04/2024  - Nephrology following - Avoiding nephrotoxins, hypotension, - Likely due to volume depletion, therefore ARB/SGLT2i and diuretics has been held.    Heart failure with preserved ejection fraction Last echo 03/22/2024: EJF 60 to 65%.  Normal LV function-moderate LVH - BNP 18.6, troponin 17, Patient may have overdiuresis.  Holding diuretics- no evidence of fluid overload  Chest x-ray:No active cardiopulmonary disease.  -Stable monitoring closely   paroxysmal atrial fibrillation:  Rate is controlled.   Continue Eliquis .  Continue to monitor.   Essential hypertension:  -BP running soft - monitoring Continue hydralazine  and Imdur    Diabetes: Continue sliding scale insulin .   Hyperlipidemia: Continue statin.   Hyperkalemia: Potassium 6.0.  Will give Lokelma      ----------------------------------------------------------------------------------------------------------------- Nutritional status:  The patient's BMI is: Body mass index is 26.66 kg/m. I agree with the assessment and plan as outlined     DVT prophylaxis:  apixaban  (ELIQUIS ) tablet 2.5 mg Start: 04/04/24 2200 apixaban  (ELIQUIS ) tablet 2.5 mg  Code Status:   Code Status: Full Code  Family Communication: No family member present at bedside-   -Advance care planning has been discussed.   Admission status:   Status is: Observation The patient remains OBS appropriate and will d/c before 2 midnights.   Disposition: From  - home             Planning for discharge in 1 days back home if creatinine improving and hemodynamically remained stable  Procedures:   No admission procedures for hospital encounter.   Antimicrobials:  Anti-infectives (From admission, onward)    None         Medication:   apixaban   2.5 mg Oral BID   insulin  aspart  0-15 Units Subcutaneous TID WC   insulin  aspart  0-5 Units Subcutaneous QHS   isosorbide  mononitrate  90 mg Oral Daily   rosuvastatin   20 mg Oral Daily    acetaminophen , hydrALAZINE , ondansetron    Objective:   Vitals:   04/06/24 0100 04/06/24 0400 04/06/24 0718 04/06/24 1111  BP: 97/64 112/66 126/67 118/69  Pulse: 62 66 66 76  Resp:   20 16  Temp: 98.1 F (36.7 C) (!) 97.5 F (36.4 C) 97.8 F (36.6 C) 98.1 F (36.7 C)  TempSrc: Oral Oral Oral Oral  SpO2: 100% 96% 96% 95%  Weight:      Height:        Intake/Output Summary (Last 24 hours) at 04/06/2024 1308 Last data filed at 04/06/2024 1259 Gross per 24 hour  Intake 360 ml  Output 1150 ml  Net -790 ml   Filed Weights   04/04/24 1311 04/04/24 2335 04/05/24 0612  Weight: 81.6 kg 77.7 kg 77.2 kg     Physical examination:    General:  AAO x 3,  cooperative, no distress;   HEENT:  Normocephalic, PERRL, otherwise with in Normal limits   Neuro:  CNII-XII intact. , normal motor and sensation, reflexes intact   Lungs:   Clear to auscultation BL, Respirations unlabored,  No wheezes / crackles  Cardio:    S1/S2, RRR, No murmure, No Rubs or Gallops   Abdomen:  Soft, non-tender, bowel sounds active all four quadrants, no guarding or peritoneal signs.  Muscular  skeletal:  Limited exam -global generalized weaknesses - in bed, able to move all 4 extremities,   2+ pulses,  symmetric, No pitting edema  Skin:  Dry, warm to touch, negative for any Rashes,  Wounds: Please see nursing documentation   ----------------------------------------------------------------------------------    LABs:     Latest Ref Rng & Units 04/05/2024    3:10 AM 04/04/2024    1:58 PM 03/24/2024    4:10 PM  CBC  WBC 4.0 - 10.5 K/uL 10.4  11.5  9.3   Hemoglobin 13.0 - 17.0 g/dL 89.7  89.4  9.2   Hematocrit 39.0 - 52.0 % 32.4  34.6  30.3   Platelets 150 - 400 K/uL 223  298  288        Latest Ref Rng & Units 04/06/2024    2:55 AM 04/05/2024    3:10 AM 04/04/2024    1:58 PM  CMP  Glucose 70 - 99 mg/dL 96  889  879   BUN 8 - 23 mg/dL 77  88  87   Creatinine 0.61 - 1.24 mg/dL 6.82  6.39  6.48   Sodium 135 - 145 mmol/L 143  145  143   Potassium 3.5 - 5.1 mmol/L 4.8  5.1  6.0   Chloride  98 - 111 mmol/L 117  119  115   CO2 22 - 32 mmol/L 18  18  16    Calcium  8.9 - 10.3 mg/dL 8.1  8.6  8.5   Total Protein 6.5 - 8.1 g/dL  5.8  6.5   Total Bilirubin 0.0 - 1.2 mg/dL  0.5  0.9   Alkaline Phos 38 - 126 U/L  120  130   AST 15 - 41 U/L  22  28   ALT 0 - 44 U/L  12  16        Micro Results No results found for this or any previous visit (from the past 240 hours).  Radiology Reports No results found.   SIGNED: Adriana DELENA Grams, MD, FHM. FAAFP. Jolynn Pack - [facility] Time spent - 55 min.  In seeing, evaluating and examining the patient. Reviewing medical records, labs, drawn plan of care. [facility],  Pager (please use [email or web address] to page/ text) Please use Epic Secure Chat for non-urgent communication (7AM-7PM)  If 7PM-7AM, please contact night-coverage [email or web address], 04/06/2024, 1:08 PM

## 2024-04-06 NOTE — Progress Notes (Signed)
 Subjective:  1200 UOP-  crt down again a little-   he says he feels a difference from yesterday-- is stronger !  Objective Vital signs in last 24 hours: Vitals:   04/06/24 0000 04/06/24 0100 04/06/24 0400 04/06/24 0718  BP: 123/67 97/64 112/66 126/67  Pulse: 73 62 66 66  Resp:    20  Temp: 98.1 F (36.7 C) 98.1 F (36.7 C) (!) 97.5 F (36.4 C) 97.8 F (36.6 C)  TempSrc: Oral Oral Oral Oral  SpO2: 97% 100% 96% 96%  Weight:      Height:       Weight change:   Intake/Output Summary (Last 24 hours) at 04/06/2024 1047 Last data filed at 04/06/2024 0900 Gross per 24 hour  Intake 240 ml  Output 1450 ml  Net -1210 ml    Assessment/Plan: 83 year old with known CKD likely due to DM, HTN and vascular dz-  now with some A on CRF in the setting of some volume depletion and soft BP on ARB, diuretic and SGLT2 1.Renal- A on CRF-  suspect is hemodynamic in the setting of volume depletion with continued ARB/SGLT2 and diuretic.  All of those meds on hold and pt getting gentle hydration, now held-  no acute indications for dialysis at this time and clinically seems to be improving-  no new suggestions.  If trends better again tomorrow may be able to discharge with follow up   2. Hypertension/volume  - BP meds on hold and getting IVF-   ok to continue for now  3. Hyperkalemia -  improved with medical management and holding of ARB 4. Anemia  - not too significant at this time -  suspect will go down with hydration    Curtis DELENA Heman    Labs: Basic Metabolic Panel: Recent Labs  Lab 04/04/24 1358 04/05/24 0310 04/06/24 0255  NA 143 145 143  K 6.0* 5.1 4.8  CL 115* 119* 117*  CO2 16* 18* 18*  GLUCOSE 120* 110* 96  BUN 87* 88* 77*  CREATININE 3.51* 3.60* 3.17*  CALCIUM  8.5* 8.6* 8.1*   Liver Function Tests: Recent Labs  Lab 04/04/24 1358 04/05/24 0310  AST 28 22  ALT 16 12  ALKPHOS 130* 120  BILITOT 0.9 0.5  PROT 6.5 5.8*  ALBUMIN  2.5* 2.3*   No results for input(s):  LIPASE, AMYLASE in the last 168 hours. No results for input(s): AMMONIA in the last 168 hours. CBC: Recent Labs  Lab 04/04/24 1358 04/05/24 0310  WBC 11.5* 10.4  NEUTROABS 8.9*  --   HGB 10.5* 10.2*  HCT 34.6* 32.4*  MCV 88.3 86.4  PLT 298 223   Cardiac Enzymes: No results for input(s): CKTOTAL, CKMB, CKMBINDEX, TROPONINI in the last 168 hours. CBG: Recent Labs  Lab 04/05/24 0622 04/05/24 1120 04/05/24 1616 04/05/24 2144 04/06/24 0626  GLUCAP 114* 120* 112* 115* 95    Iron Studies: No results for input(s): IRON, TIBC, TRANSFERRIN, FERRITIN in the last 72 hours. Studies/Results: US  RENAL Result Date: 04/05/2024 CLINICAL DATA:  Acute kidney insufficiency EXAM: RENAL / URINARY TRACT ULTRASOUND COMPLETE COMPARISON:  Ultrasound 01/24/2023.  Noncontrast CT 02/01/2023 FINDINGS: Right Kidney: Renal measurements: 11.7 x 6.2 x 5.2 cm = volume: 198.7 mL. Echogenic parenchyma. Mild parenchymal global atrophy. No collecting system dilatation or perinephric fluid. Left Kidney: Renal measurements: 11.8 x 6.2 x 5.8 cm = volume: 222.3 mL. No collecting system dilatation or perinephric fluid. Global mild parenchymal atrophy. Small anechoic focus exophytic from the midportion of  the kidney measures 12 mm. Bladder: Underdistended bladder. Urinary bladder wall thickening with some trabeculation. Enlarged prostate. Prostate measures 7.1 x 4.7 x 7.0 cm. Other: None. IMPRESSION: Mild bilateral renal atrophy. Right kidney appears slightly echogenic. No collecting system dilatation. Enlarged prostate.  Please correlate with any history and prior PSA Electronically Signed   By: Ranell Bring M.D.   On: 04/05/2024 11:13   DG Chest 2 View Result Date: 04/04/2024 CLINICAL DATA:  Shortness of breath EXAM: CHEST - 2 VIEW COMPARISON:  03/24/2024 FINDINGS: Post sternotomy changes. Electronic recording device over left chest. No acute airspace disease, pleural effusion or pneumothorax. Normal  cardiac size. IMPRESSION: No active cardiopulmonary disease. Electronically Signed   By: Luke Bun M.D.   On: 04/04/2024 17:21   Medications: Infusions:   Scheduled Medications:  apixaban   2.5 mg Oral BID   insulin  aspart  0-15 Units Subcutaneous TID WC   insulin  aspart  0-5 Units Subcutaneous QHS   isosorbide  mononitrate  90 mg Oral Daily   rosuvastatin   20 mg Oral Daily    have reviewed scheduled and prn medications.  Physical Exam: General:  elderly-  watching golf-  says he feels better Heart: RRR-  slightly slow Lungs: mostly clear Abdomen: soft, non tender Extremities: no peripheral edema     04/06/2024,10:47 AM  LOS: 0 days

## 2024-04-07 DIAGNOSIS — C786 Secondary malignant neoplasm of retroperitoneum and peritoneum: Secondary | ICD-10-CM | POA: Diagnosis not present

## 2024-04-07 DIAGNOSIS — N1832 Chronic kidney disease, stage 3b: Secondary | ICD-10-CM | POA: Diagnosis not present

## 2024-04-07 DIAGNOSIS — J9811 Atelectasis: Secondary | ICD-10-CM | POA: Diagnosis not present

## 2024-04-07 DIAGNOSIS — F419 Anxiety disorder, unspecified: Secondary | ICD-10-CM | POA: Diagnosis not present

## 2024-04-07 DIAGNOSIS — I5032 Chronic diastolic (congestive) heart failure: Secondary | ICD-10-CM | POA: Diagnosis not present

## 2024-04-07 DIAGNOSIS — N183 Chronic kidney disease, stage 3 unspecified: Secondary | ICD-10-CM | POA: Diagnosis not present

## 2024-04-07 DIAGNOSIS — S42002A Fracture of unspecified part of left clavicle, initial encounter for closed fracture: Secondary | ICD-10-CM | POA: Diagnosis not present

## 2024-04-07 DIAGNOSIS — D696 Thrombocytopenia, unspecified: Secondary | ICD-10-CM | POA: Diagnosis not present

## 2024-04-07 DIAGNOSIS — Z66 Do not resuscitate: Secondary | ICD-10-CM | POA: Diagnosis not present

## 2024-04-07 DIAGNOSIS — R638 Other symptoms and signs concerning food and fluid intake: Secondary | ICD-10-CM | POA: Diagnosis not present

## 2024-04-07 DIAGNOSIS — Z8249 Family history of ischemic heart disease and other diseases of the circulatory system: Secondary | ICD-10-CM | POA: Diagnosis not present

## 2024-04-07 DIAGNOSIS — Z7901 Long term (current) use of anticoagulants: Secondary | ICD-10-CM | POA: Diagnosis not present

## 2024-04-07 DIAGNOSIS — I4430 Unspecified atrioventricular block: Secondary | ICD-10-CM | POA: Diagnosis not present

## 2024-04-07 DIAGNOSIS — Z7401 Bed confinement status: Secondary | ICD-10-CM | POA: Diagnosis not present

## 2024-04-07 DIAGNOSIS — I5022 Chronic systolic (congestive) heart failure: Secondary | ICD-10-CM | POA: Diagnosis not present

## 2024-04-07 DIAGNOSIS — I10 Essential (primary) hypertension: Secondary | ICD-10-CM | POA: Diagnosis not present

## 2024-04-07 DIAGNOSIS — J189 Pneumonia, unspecified organism: Secondary | ICD-10-CM | POA: Diagnosis not present

## 2024-04-07 DIAGNOSIS — E440 Moderate protein-calorie malnutrition: Secondary | ICD-10-CM | POA: Diagnosis not present

## 2024-04-07 DIAGNOSIS — C801 Malignant (primary) neoplasm, unspecified: Secondary | ICD-10-CM | POA: Diagnosis not present

## 2024-04-07 DIAGNOSIS — I2510 Atherosclerotic heart disease of native coronary artery without angina pectoris: Secondary | ICD-10-CM | POA: Diagnosis not present

## 2024-04-07 DIAGNOSIS — E875 Hyperkalemia: Secondary | ICD-10-CM | POA: Diagnosis not present

## 2024-04-07 DIAGNOSIS — Z951 Presence of aortocoronary bypass graft: Secondary | ICD-10-CM | POA: Diagnosis not present

## 2024-04-07 DIAGNOSIS — N184 Chronic kidney disease, stage 4 (severe): Secondary | ICD-10-CM | POA: Diagnosis not present

## 2024-04-07 DIAGNOSIS — J90 Pleural effusion, not elsewhere classified: Secondary | ICD-10-CM | POA: Diagnosis not present

## 2024-04-07 DIAGNOSIS — E1122 Type 2 diabetes mellitus with diabetic chronic kidney disease: Secondary | ICD-10-CM | POA: Diagnosis not present

## 2024-04-07 DIAGNOSIS — I509 Heart failure, unspecified: Secondary | ICD-10-CM | POA: Diagnosis not present

## 2024-04-07 DIAGNOSIS — R5381 Other malaise: Secondary | ICD-10-CM | POA: Diagnosis not present

## 2024-04-07 DIAGNOSIS — M6281 Muscle weakness (generalized): Secondary | ICD-10-CM | POA: Diagnosis not present

## 2024-04-07 DIAGNOSIS — I4891 Unspecified atrial fibrillation: Secondary | ICD-10-CM | POA: Diagnosis not present

## 2024-04-07 DIAGNOSIS — R278 Other lack of coordination: Secondary | ICD-10-CM | POA: Diagnosis not present

## 2024-04-07 DIAGNOSIS — I5030 Unspecified diastolic (congestive) heart failure: Secondary | ICD-10-CM | POA: Diagnosis not present

## 2024-04-07 DIAGNOSIS — J168 Pneumonia due to other specified infectious organisms: Secondary | ICD-10-CM | POA: Diagnosis not present

## 2024-04-07 DIAGNOSIS — Z79899 Other long term (current) drug therapy: Secondary | ICD-10-CM | POA: Diagnosis not present

## 2024-04-07 DIAGNOSIS — R0989 Other specified symptoms and signs involving the circulatory and respiratory systems: Secondary | ICD-10-CM | POA: Diagnosis not present

## 2024-04-07 DIAGNOSIS — Z8673 Personal history of transient ischemic attack (TIA), and cerebral infarction without residual deficits: Secondary | ICD-10-CM | POA: Diagnosis not present

## 2024-04-07 DIAGNOSIS — R531 Weakness: Secondary | ICD-10-CM | POA: Diagnosis not present

## 2024-04-07 DIAGNOSIS — I129 Hypertensive chronic kidney disease with stage 1 through stage 4 chronic kidney disease, or unspecified chronic kidney disease: Secondary | ICD-10-CM | POA: Diagnosis not present

## 2024-04-07 DIAGNOSIS — R5383 Other fatigue: Secondary | ICD-10-CM | POA: Diagnosis not present

## 2024-04-07 DIAGNOSIS — Z7189 Other specified counseling: Secondary | ICD-10-CM | POA: Diagnosis not present

## 2024-04-07 DIAGNOSIS — J849 Interstitial pulmonary disease, unspecified: Secondary | ICD-10-CM | POA: Diagnosis not present

## 2024-04-07 DIAGNOSIS — I130 Hypertensive heart and chronic kidney disease with heart failure and stage 1 through stage 4 chronic kidney disease, or unspecified chronic kidney disease: Secondary | ICD-10-CM | POA: Diagnosis not present

## 2024-04-07 DIAGNOSIS — E861 Hypovolemia: Secondary | ICD-10-CM | POA: Diagnosis not present

## 2024-04-07 DIAGNOSIS — N179 Acute kidney failure, unspecified: Secondary | ICD-10-CM | POA: Diagnosis not present

## 2024-04-07 DIAGNOSIS — Z515 Encounter for palliative care: Secondary | ICD-10-CM | POA: Diagnosis not present

## 2024-04-07 DIAGNOSIS — E119 Type 2 diabetes mellitus without complications: Secondary | ICD-10-CM | POA: Diagnosis not present

## 2024-04-07 DIAGNOSIS — X58XXXA Exposure to other specified factors, initial encounter: Secondary | ICD-10-CM | POA: Diagnosis present

## 2024-04-07 DIAGNOSIS — E785 Hyperlipidemia, unspecified: Secondary | ICD-10-CM | POA: Diagnosis not present

## 2024-04-07 DIAGNOSIS — I480 Paroxysmal atrial fibrillation: Secondary | ICD-10-CM | POA: Diagnosis not present

## 2024-04-07 DIAGNOSIS — R2689 Other abnormalities of gait and mobility: Secondary | ICD-10-CM | POA: Diagnosis not present

## 2024-04-07 DIAGNOSIS — Z794 Long term (current) use of insulin: Secondary | ICD-10-CM | POA: Diagnosis not present

## 2024-04-07 DIAGNOSIS — D631 Anemia in chronic kidney disease: Secondary | ICD-10-CM | POA: Diagnosis not present

## 2024-04-07 DIAGNOSIS — R188 Other ascites: Secondary | ICD-10-CM | POA: Diagnosis not present

## 2024-04-07 DIAGNOSIS — E872 Acidosis, unspecified: Secondary | ICD-10-CM | POA: Diagnosis not present

## 2024-04-07 DIAGNOSIS — I482 Chronic atrial fibrillation, unspecified: Secondary | ICD-10-CM | POA: Diagnosis not present

## 2024-04-07 DIAGNOSIS — R0602 Shortness of breath: Secondary | ICD-10-CM | POA: Diagnosis not present

## 2024-04-07 DIAGNOSIS — D649 Anemia, unspecified: Secondary | ICD-10-CM | POA: Diagnosis not present

## 2024-04-07 DIAGNOSIS — R918 Other nonspecific abnormal finding of lung field: Secondary | ICD-10-CM | POA: Diagnosis not present

## 2024-04-07 DIAGNOSIS — A419 Sepsis, unspecified organism: Secondary | ICD-10-CM | POA: Diagnosis not present

## 2024-04-07 LAB — BASIC METABOLIC PANEL WITH GFR
Anion gap: 7 (ref 5–15)
BUN: 73 mg/dL — ABNORMAL HIGH (ref 8–23)
CO2: 18 mmol/L — ABNORMAL LOW (ref 22–32)
Calcium: 8.2 mg/dL — ABNORMAL LOW (ref 8.9–10.3)
Chloride: 117 mmol/L — ABNORMAL HIGH (ref 98–111)
Creatinine, Ser: 2.95 mg/dL — ABNORMAL HIGH (ref 0.61–1.24)
GFR, Estimated: 21 mL/min — ABNORMAL LOW (ref >60)
Glucose, Bld: 96 mg/dL (ref 70–99)
Potassium: 5 mmol/L (ref 3.5–5.1)
Sodium: 142 mmol/L (ref 135–145)

## 2024-04-07 LAB — GLUCOSE, CAPILLARY
Glucose-Capillary: 92 mg/dL (ref 70–99)
Glucose-Capillary: 122 mg/dL — ABNORMAL HIGH (ref 70–99)

## 2024-04-07 NOTE — Progress Notes (Signed)
 Mobility Specialist Progress Note:   04/07/24 1000  Mobility  Activity Ambulated with assistance in hallway  Level of Assistance Contact guard assist, steadying assist  Assistive Device Front wheel walker  Distance Ambulated (ft) 400 ft  Activity Response Tolerated well  Mobility Referral Yes  Mobility visit 1 Mobility  Mobility Specialist Start Time (ACUTE ONLY) 1000  Mobility Specialist Stop Time (ACUTE ONLY) 1008  Mobility Specialist Time Calculation (min) (ACUTE ONLY) 8 min   Pt agreeable to session. No c/o of symptoms. Slightly impulsive and needs cueing to stay in walker. Pt able to ambulate 270ft, needed a break, and ambulated another 245ft. Left pt in recliner comfortable w/ no needs.   Therisa Rana Mobility Specialist Please contact via SecureChat or  Rehab office at [phone]

## 2024-04-07 NOTE — TOC Transition Note (Signed)
 Transition of Care [facility]) - Discharge Note   Patient Details  Name: Jake Gross MRN: [id number] Date of Birth: 31-May-1941  [facility] CM/SW Contact:  Luise JAYSON Pan, LCSWA Phone Number: 04/07/2024, 11:41 AM   Clinical Narrative:   Patient will DC to: [name] Anticipated DC date: 04/07/24  Family notified: [name] (dtr) Transport by: [place]   Per MD patient ready for DC to [facility]. RN to call report prior to discharge [phone], room 102 ). RN, patient, patient's family, and facility notified of DC. Discharge Summary and FL2 sent to facility. DC packet on chart. Ambulance transport requested for patient at 11:40AM.   CSW will sign off for now as social work intervention is no longer needed. Please consult us  again if new needs arise.      Final next level of care: Skilled Nursing Facility Barriers to Discharge: Barriers Resolved   Patient Goals and CMS Choice            Discharge Placement              Patient [facility] bed at: [facility] Patient to be transferred to facility by: [facility] Name of family member notified: [name] Patient and family notified of of transfer: 04/07/24  Discharge Plan and Services Additional resources added to the After Visit Summary for                                       Social Drivers of Health (SDOH) Interventions SDOH Screenings   Food Insecurity: No Food Insecurity (04/05/2024)  Housing: Low Risk  (04/05/2024)  Transportation Needs: No Transportation Needs (04/05/2024)  Utilities: Not At Risk (04/05/2024)  Alcohol Screen: Low Risk  (12/11/2023)  Depression (PHQ2-9): Low Risk  (02/21/2024)  Financial Resource Strain: Low Risk  (12/11/2023)  Physical Activity: Insufficiently Active (12/11/2023)  Social Connections: Moderately Isolated (04/05/2024)  Stress: No Stress Concern Present (12/11/2023)  Tobacco Use: Medium Risk (04/04/2024)  Health Literacy: Adequate Health Literacy (12/11/2023)      Readmission Risk Interventions    02/05/2023   11:08 AM  Readmission Risk Prevention Plan  Transportation Screening Complete  Medication Review (RN Care Manager) Complete  PCP or Specialist appointment within 3-5 days of discharge Complete  HRI or Home Care Consult Complete  SW Recovery Care/Counseling Consult Complete  Palliative Care Screening Not Applicable  Skilled Nursing Facility Complete

## 2024-04-07 NOTE — Discharge Planning (Signed)
 Report called to Amilia.

## 2024-04-07 NOTE — Progress Notes (Signed)
 Patient ID: Jake Gross, male   DOB: May 08, 1941, 83 y.o.   MRN: [id number] S: Feels well, no new complaints.  O:BP 135/68 (BP Location: Left Arm)   Pulse 80   Temp 97.7 F (36.5 C) (Oral)   Resp 17   Ht 5' 7 (1.702 m)   Wt 77.2 kg   SpO2 97%   BMI 26.66 kg/m   Intake/Output Summary (Last 24 hours) at 04/07/2024 1045 Last data filed at 04/07/2024 0949 Gross per 24 hour  Intake 420 ml  Output 1100 ml  Net -680 ml   Intake/Output: I/O last 3 completed shifts: In: 600 [P.O.:600] Out: 1450 [Urine:1450]  Intake/Output this shift:  Total I/O In: 60 [P.O.:60] Out: 450 [Urine:450] Weight change:  Gen:NAD CVS: RRR Resp:CTA Abd: +BS, soft, NT/ND Ext: no edema  Recent Labs  Lab 04/04/24 1358 04/05/24 0310 04/06/24 0255 04/07/24 0322  NA 143 145 143 142  K 6.0* 5.1 4.8 5.0  CL 115* 119* 117* 117*  CO2 16* 18* 18* 18*  GLUCOSE 120* 110* 96 96  BUN 87* 88* 77* 73*  CREATININE 3.51* 3.60* 3.17* 2.95*  ALBUMIN  2.5* 2.3*  --   --   CALCIUM  8.5* 8.6* 8.1* 8.2*  AST 28 22  --   --   ALT 16 12  --   --    Liver Function Tests: Recent Labs  Lab 04/04/24 1358 04/05/24 0310  AST 28 22  ALT 16 12  ALKPHOS 130* 120  BILITOT 0.9 0.5  PROT 6.5 5.8*  ALBUMIN  2.5* 2.3*   No results for input(s): LIPASE, AMYLASE in the last 168 hours. No results for input(s): AMMONIA in the last 168 hours. CBC: Recent Labs  Lab 04/04/24 1358 04/05/24 0310  WBC 11.5* 10.4  NEUTROABS 8.9*  --   HGB 10.5* 10.2*  HCT 34.6* 32.4*  MCV 88.3 86.4  PLT 298 223   Cardiac Enzymes: No results for input(s): CKTOTAL, CKMB, CKMBINDEX, TROPONINI in the last 168 hours. CBG: Recent Labs  Lab 04/06/24 1113 04/06/24 1628 04/06/24 2136 04/06/24 2141 04/07/24 0607  GLUCAP 108* 110* 105* 105* 92    Iron Studies: No results for input(s): IRON, TIBC, TRANSFERRIN, FERRITIN in the last 72 hours. Studies/Results: No results found.  apixaban   2.5 mg Oral BID   insulin   aspart  0-15 Units Subcutaneous TID WC   insulin  aspart  0-5 Units Subcutaneous QHS   isosorbide  mononitrate  90 mg Oral Daily   rosuvastatin   20 mg Oral Daily    BMET    Component Value Date/Time   NA 142 04/07/2024 0322   NA 140 05/04/2023 1054   K 5.0 04/07/2024 0322   CL 117 (H) 04/07/2024 0322   CO2 18 (L) 04/07/2024 0322   GLUCOSE 96 04/07/2024 0322   BUN 73 (H) 04/07/2024 0322   BUN 21 05/04/2023 1054   CREATININE 2.95 (H) 04/07/2024 0322   CREATININE 2.43 (H) 01/25/2024 1549   CALCIUM  8.2 (L) 04/07/2024 0322   GFRNONAA 21 (L) 04/07/2024 0322   GFRNONAA 27 (L) 05/02/2022 0845   GFRAA 32 (L) 10/27/2019 1048   CBC    Component Value Date/Time   WBC 10.4 04/05/2024 0310   RBC 3.75 (L) 04/05/2024 0310   HGB 10.2 (L) 04/05/2024 0310   HGB 11.8 (L) 05/02/2022 0845   HGB 10.0 (L) 09/26/2019 1150   HCT 32.4 (L) 04/05/2024 0310   HCT 33.3 (L) 09/26/2019 1150   PLT 223 04/05/2024 0310   PLT 246  05/02/2022 0845   PLT 229 09/26/2019 1150   MCV 86.4 04/05/2024 0310   MCV 82 09/26/2019 1150   MCH 27.2 04/05/2024 0310   MCHC 31.5 04/05/2024 0310   RDW 14.1 04/05/2024 0310   RDW 23.2 (H) 09/26/2019 1150   LYMPHSABS 1.4 04/04/2024 1358   MONOABS 1.0 04/04/2024 1358   EOSABS 0.1 04/04/2024 1358   BASOSABS 0.1 04/04/2024 1358    Assessment/Plan: 83 year old with known CKD likely due to DM, HTN and vascular dz-  now with some A on CRF in the setting of some volume depletion and soft BP on ARB, diuretic and SGLT2 1.Renal- AKI on CKD stage IV-  suspect is hemodynamic in the setting of volume depletion with continued ARB/SGLT2 and diuretic.  All of those meds on hold and pt getting gentle hydration, with improvement of BUN/Cr.  -  no acute indications for dialysis at this time and clinically seems to be improving-  no new suggestions.  He is stable for discharge from a renal standpoint and will follow while he is in CIR.  Baseline Scr appears to be around 2-2.4   2.  Hypertension/volume  - BP meds on hold and getting IVF-   ok to continue for now  3. Hyperkalemia -  improved with medical management and holding of ARB 4. Anemia  - not too significant at this time -  suspect will go down with hydration  Fairy RONAL Sellar, MD [facility]

## 2024-04-07 NOTE — Discharge Summary (Signed)
 Physician Discharge Summary   Patient: Jake Gross MRN: [id number] DOB: Nov 14, 1940  Admit date:     04/04/2024  Discharge date: 04/07/24  Discharge Physician: Adriana DELENA Grams   PCP: Pcp, No   Recommendations at discharge:   Follow-up with PCP in 1-2 weeks Follow-up with cardiologist in 1 week BMP q. 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 *  Hospital Course:  [name] is a 83 y.o. male with medical history significant of coronary artery disease, chronic diastolic heart failure, chronic kidney disease stage III, type 2 diabetes, history of GI bleed, essential hypertension, hyperlipidemia, paroxysmal atrial fibrillation on anticoagulation, history of coronary artery bypass grafting history of CVA now presents to the ER with complaint of shortness of breath.  Shortness of breath apparently has been going on for about 4 days.  Associated with mild cough.  Patient has also noted generalized weakness.  He has not been mobile as he used to do today shortness of breath.  He has been globally weak patient came to the ER where he was seen and evaluated.  He appears to be very weak and lives by himself.  Patient appears to have worsening renal function and seems to be  dehydrated.    Generalized weakness: - Improving  - multifactorial.  Dehydration, deconditioning, severe debility - PT and OT.SABRA  Recommended SNF placement   AKI on CKD 3: Lab Results  Component Value Date   CREATININE 2.95 (H) 04/07/2024   CREATININE 3.17 (H) 04/06/2024   CREATININE 3.60 (H) 04/05/2024   - Nephrology following - Avoiding nephrotoxins, hypotension, - Likely due to volume depletion, therefore ARB/SGLT2i and diuretics has been held.    Heart failure with preserved ejection fraction Last echo 03/22/2024: EJF 60 to 65%.  Normal LV function-moderate LVH - BNP 18.6, troponin 17, Patient may have overdiuresis.  Holding diuretics- no evidence of fluid overload -May resume Demadex  if increase swelling, shortness of breath, Chest x-ray:No active cardiopulmonary disease.  - Monitor daily Gross, and symptoms such as shortness of breath, lower extremity swelling   paroxysmal atrial fibrillation:  Continue Eliquis .     Essential hypertension: -BP soft, cautiously resuming hydralazine  and Imdur     Diabetes: Strict diabetic diet carb modified, SSI coverage-consider po meds once oral intake is adequate   Hyperlipidemia: Continue statin.   Hyperkalemia: Potassium 6.0.  Will give Lokelma -potassium stabilized    Consultants: Nephrologist  Disposition: Home Diet recommendation:  Discharge Diet Orders (From admission, onward)     Start     Ordered   04/07/24 0000  Diet - low sodium heart healthy        04/07/24 1042           Cardiac and Carb modified diet DISCHARGE MEDICATION: Allergies as of 04/07/2024   No Known Allergies      Medication List  PAUSE taking these medications    dapagliflozin  propanediol 10 MG Tabs tablet Wait to take this until: April 10, 2024 Commonly known as: FARXIGA  Take 10 mg by mouth daily.   torsemide  20 MG tablet Wait to take this until: April 11, 2024 Commonly known as: DEMADEX  TAKE 1 TABLET BY MOUTH TWICE A DAY ON  MONDAYS AND THURSDAYS ONLY, TAKE 1 TABLET BY MOUTH DAILY ONLY ALL OTHER DAYS       STOP taking these medications    doxycycline  100 MG tablet Commonly known as: VIBRA -TABS   hydrALAZINE  100 MG tablet Commonly known as: APRESOLINE    olmesartan  5 MG tablet Commonly known as: BENICAR        TAKE these medications    Accu-Chek Guide test strip Generic drug: glucose blood 1 EACH BY OTHER ROUTE 3 (THREE) TIMES DAILY AS NEEDED FOR OTHER. USE AS INSTRUCTED   acetaminophen  325 MG tablet Commonly known as: TYLENOL  Take 2 tablets (650 mg total) by mouth every 6 (six) hours as needed for mild pain (pain score 1-3) (or Fever >/= 101).   apixaban  2.5 MG Tabs tablet Commonly known as: Eliquis  Take 1 tablet (2.5 mg total) by mouth 2 (two) times daily.   FreeStyle Libre 2 Reader [place] Use to check blood sugars.   FreeStyle Libre 2 Sensor Misc Apply every 14 days to check blood sugars.   isosorbide  mononitrate 60 MG 24 hr tablet Commonly known as: IMDUR  Take 1.5 tablets (90 mg total) by mouth daily.   ondansetron  4 MG tablet Commonly known as: ZOFRAN  Take 4 mg by mouth every 6 (six) hours as needed for nausea or vomiting.   onetouch ultrasoft lancets Use as instructed   rosuvastatin  20 MG tablet Commonly known as: CRESTOR  TAKE 1 TABLET BY MOUTH AT BEDTIME. SCHEDULE PHYSICAL EXAM        Follow-up Information     Wendee Lynwood HERO, NP. Go on 04/14/2024.   Specialties: Nurse Practitioner, Family Medicine Why: @11 :Engineer, maintenance (IT) information: [address] [place] [address] [phone]                Discharge Exam: Fredricka Gross   04/04/24 1311 04/04/24 2335 04/05/24 0612  Weight: 81.6 kg 77.7 kg 77.2 kg        General:  AAO x 3,  cooperative, no distress;   HEENT:  Normocephalic, PERRL, otherwise with in Normal limits   Neuro:  CNII-XII intact. , normal motor and sensation, reflexes intact   Lungs:   Clear to auscultation BL, Respirations  unlabored,  No wheezes / crackles  Cardio:    S1/S2, RRR, No murmure, No Rubs or Gallops   Abdomen:  Soft, non-tender, bowel sounds active all four quadrants, no guarding or peritoneal signs.  Muscular  skeletal:  Limited exam -global generalized weaknesses - in bed, able to move all 4 extremities,   2+ pulses,  symmetric, No pitting edema  Skin:  Dry, warm to touch, negative for any Rashes,  Wounds: Please see nursing documentation         Condition at discharge: fair  The results of significant diagnostics from this hospitalization (including imaging, microbiology, ancillary and laboratory) are listed below for reference.   Imaging Studies: US  RENAL Result Date: 04/05/2024 CLINICAL DATA:  Acute kidney insufficiency EXAM: RENAL / URINARY TRACT ULTRASOUND COMPLETE COMPARISON:  Ultrasound 01/24/2023.  Noncontrast CT 02/01/2023 FINDINGS: Right Kidney: Renal measurements: 11.7 x 6.2 x 5.2 cm = volume: 198.7 mL. Echogenic parenchyma. Mild parenchymal global atrophy. No  collecting system dilatation or perinephric fluid. Left Kidney: Renal measurements: 11.8 x 6.2 x 5.8 cm = volume: 222.3 mL. No collecting system dilatation or perinephric fluid. Global mild parenchymal atrophy. Small anechoic focus exophytic from the midportion of the kidney measures 12 mm. Bladder: Underdistended bladder. Urinary bladder wall thickening with some trabeculation. Enlarged prostate. Prostate measures 7.1 x 4.7 x 7.0 cm. Other: None. IMPRESSION: Mild bilateral renal atrophy. Right kidney appears slightly echogenic. No collecting system dilatation. Enlarged prostate.  Please correlate with any history and prior PSA Electronically Signed   By: Ranell Bring M.D.   On: 04/05/2024 11:13   DG Chest 2 View Result Date: 04/04/2024 CLINICAL DATA:  Shortness of breath EXAM: CHEST - 2 VIEW COMPARISON:  03/24/2024 FINDINGS: Post sternotomy changes. Electronic recording device over left chest. No acute airspace disease, pleural  effusion or pneumothorax. Normal cardiac size. IMPRESSION: No active cardiopulmonary disease. Electronically Signed   By: Luke Bun M.D.   On: 04/04/2024 17:21   DG Chest 1 View Result Date: 03/24/2024 CLINICAL DATA:  Pneumonia, weakness EXAM: CHEST  1 VIEW COMPARISON:  03/20/2024 FINDINGS: Prior CABG. Loop recorder projects over the left chest, unchanged. Heart is borderline in size. Mild vascular congestion. Bilateral interstitial opacities most notable in the lower lobes, slightly improved since prior study. No effusions or acute bony abnormality. IMPRESSION: Borderline cardiomegaly, vascular congestion. Interstitial prominence in the lower lobes, improving since prior study, likely improving edema or infection. Electronically Signed   By: Franky Crease M.D.   On: 03/24/2024 18:10   ECHOCARDIOGRAM COMPLETE Result Date: 03/22/2024    ECHOCARDIOGRAM REPORT   Patient Name:   CHOU BUSLER Tetrault Date of Exam: 03/22/2024 Medical Rec #:  [id number]         Height:       67.0 in Accession #:    [id number]        Weight:       182.6 lb Date of Birth:  [birth year]         BSA:          1.946 m Patient Age:    83 years          BP:           144/68 mmHg Patient Gender: M                 HR:           65 bpm. Exam Location:  Inpatient Procedure: 2D Echo, Color Doppler, Cardiac Doppler and Intracardiac            Opacification Agent (Both Spectral and Color Flow Doppler were            utilized during procedure). Indications:    CHF-Acute Diastolic  History:        Patient has prior history of Echocardiogram examinations, most                 recent 02/04/2023. CAD, Prior CABG, Stroke; Risk                 Factors:Diabetes, Hypertension and Dyslipidemia. CKD.  Sonographer:    Logan Shove RDCS Referring Phys: [phone] MAUDE JAYSON EMMER  Sonographer Comments: Image acquisition challenging due to patient body habitus. IMPRESSIONS  1. Left ventricular ejection fraction, by estimation, is 60 to 65%. The left ventricle has normal function.  The left ventricle has no regional wall motion abnormalities. There is moderate left ventricular hypertrophy. Left ventricular diastolic parameters are indeterminate.  2. Right ventricular systolic function is normal. The right ventricular size is normal.  3. Left atrial size was moderately dilated.  4. The mitral valve is abnormal. Mild to moderate mitral valve regurgitation. No evidence of mitral stenosis. Moderate mitral annular calcification.  5. The aortic valve is tricuspid. There is moderate calcification of the aortic valve. There is moderate thickening of the aortic valve. Aortic valve regurgitation is trivial. Mild aortic valve stenosis.  6. Aortic dilatation noted. There is mild dilatation of the ascending aorta, measuring 40 mm.  7. The inferior vena cava is normal in size with greater than 50% respiratory variability, suggesting right atrial pressure of 3 mmHg. FINDINGS  Left Ventricle: Left ventricular ejection fraction, by estimation, is 60 to 65%. The left ventricle has normal function. The left ventricle has no regional wall motion abnormalities. Strain was performed and the global longitudinal strain is indeterminate. The left ventricular internal cavity size was normal in size. There is moderate left ventricular hypertrophy. Left ventricular diastolic parameters are indeterminate. Right Ventricle: The right ventricular size is normal. No increase in right ventricular wall thickness. Right ventricular systolic function is normal. Left Atrium: Left atrial size was moderately dilated. Right Atrium: Right atrial size was normal in size. Pericardium: There is no evidence of pericardial effusion. Mitral Valve: The mitral valve is abnormal. There is mild thickening of the mitral valve leaflet(s). There is mild calcification of the mitral valve leaflet(s). Moderate mitral annular calcification. Mild to moderate mitral valve regurgitation. No evidence of mitral valve stenosis. Tricuspid Valve: The  tricuspid valve is normal in structure. Tricuspid valve regurgitation is trivial. No evidence of tricuspid stenosis. Aortic Valve: The aortic valve is tricuspid. There is moderate calcification of the aortic valve. There is moderate thickening of the aortic valve. Aortic valve regurgitation is trivial. Mild aortic stenosis is present. Aortic valve mean gradient measures 8.0 mmHg. Aortic valve peak gradient measures 13.8 mmHg. Aortic valve area, by VTI measures 1.52 cm. Pulmonic Valve: The pulmonic valve was normal in structure. Pulmonic valve regurgitation is trivial. No evidence of pulmonic stenosis. Aorta: Aortic dilatation noted. There is mild dilatation of the ascending aorta, measuring 40 mm. Venous: The inferior vena cava is normal in size with greater than 50% respiratory variability, suggesting right atrial pressure of 3 mmHg. IAS/Shunts: No atrial level shunt detected by color flow Doppler. Additional Comments: 3D was performed not requiring image post processing on an independent workstation and was indeterminate.  LEFT VENTRICLE PLAX 2D LVIDd:         4.70 cm LVIDs:         3.10 cm LV PW:         1.00 cm LV IVS:        1.40 cm LVOT diam:     1.80 cm LV SV:         69 LV SV Index:   35 LVOT Area:     2.54 cm  LV Volumes (MOD) LV vol d, MOD A2C: 183.0 ml LV vol d, MOD A4C: 179.0 ml LV vol s, MOD A2C: 61.5 ml LV vol s, MOD A4C: 72.3 ml LV SV MOD A2C:     121.5 ml LV SV MOD A4C:     179.0 ml LV SV MOD BP:      114.6 ml RIGHT VENTRICLE             IVC RV Basal diam:  3.80 cm     IVC diam: 2.00 cm RV S prime:  10.43 cm/s TAPSE (M-mode): 2.2 cm LEFT ATRIUM              Index        RIGHT ATRIUM           Index LA diam:        5.80 cm  2.98 cm/m   RA Area:     15.00 cm LA Vol (A2C):   153.0 ml 78.64 ml/m  RA Volume:   33.80 ml  17.37 ml/m LA Vol (A4C):   159.0 ml 81.73 ml/m LA Biplane Vol: 156.0 ml 80.18 ml/m  AORTIC VALVE AV Area (Vmax):    1.48 cm AV Area (Vmean):   1.35 cm AV Area (VTI):     1.52  cm AV Vmax:           185.50 cm/s AV Vmean:          132.000 cm/s AV VTI:            0.454 m AV Peak Grad:      13.8 mmHg AV Mean Grad:      8.0 mmHg LVOT Vmax:         107.96 cm/s LVOT Vmean:        69.800 cm/s LVOT VTI:          0.270 m LVOT/AV VTI ratio: 0.60  AORTA Ao Root diam: 3.70 cm Ao Asc diam:  4.00 cm  SHUNTS Systemic VTI:  0.27 m Systemic Diam: 1.80 cm Maude Emmer MD Electronically signed by Maude Emmer MD Signature Date/Time: 03/22/2024/2:37:56 PM    Final    DG Chest Port 1 View Result Date: 03/20/2024 CLINICAL DATA:  sob EXAM: PORTABLE CHEST - 1 VIEW COMPARISON:  Mar 15, 2024 FINDINGS: Low lung volumes. Bilateral perihilar interstitial opacities. No focal airspace consolidation, pleural effusion, or pneumothorax. Cardiomegaly. In planted cardiac loop recorder device. CABG markers and sternotomy wires. Tortuous aorta with aortic atherosclerosis. No acute fracture or destructive lesions. Multilevel thoracic osteophytosis. IMPRESSION: Cardiomegaly with findings of either interstitial edema or atypical/viral infection. Electronically Signed   By: Rogelia Myers M.D.   On: 03/20/2024 17:06   DG Chest Portable 1 View Result Date: 03/15/2024 CLINICAL DATA:  Shortness of breath. EXAM: PORTABLE CHEST 1 VIEW COMPARISON:  Chest x-ray 02/01/2023. FINDINGS: The heart is enlarged. There is central pulmonary vascular congestion. There is no focal lung infiltrate, pleural effusion or pneumothorax. Patient is status post cardiac surgery. Loop recorder device overlies the left chest. There is an acute appearing mid left clavicular fracture with 1 shaft width inferior displacement of the distal fracture fragment. IMPRESSION: 1. Cardiomegaly with central pulmonary vascular congestion. 2. Acute appearing mid left clavicular fracture. Please correlate clinically. Electronically Signed   By: Greig Pique M.D.   On: 03/15/2024 19:32    Microbiology: Results for orders placed or performed during the hospital  encounter of 03/20/24  Resp panel by RT-PCR (RSV, Flu A&B, Covid) Anterior Nasal Swab     Status: None   Collection Time: 03/20/24  6:23 PM   Specimen: Anterior Nasal Swab  Result Value Ref Range Status   SARS Coronavirus 2 by RT PCR NEGATIVE NEGATIVE Final   Influenza A by PCR NEGATIVE NEGATIVE Final   Influenza B by PCR NEGATIVE NEGATIVE Final    Comment: (NOTE) The Xpert Xpress SARS-CoV-2/FLU/RSV plus assay is intended as an aid in the diagnosis of influenza from Nasopharyngeal swab specimens and should not be used as a sole basis for treatment. Nasal washings and  aspirates are unacceptable for Xpert Xpress SARS-CoV-2/FLU/RSV testing.  Fact Sheet for Patients: BloggerCourse.com  Fact Sheet for Healthcare Providers: SeriousBroker.it  This test is not yet approved or cleared by the [place]  FDA and has been authorized for detection and/or diagnosis of SARS-CoV-2 by FDA under an Emergency Use Authorization (EUA). This EUA will remain in effect (meaning this test can be used) for the duration of the COVID-19 declaration under Section 564(b)(1) of the Act, 21 U.S.C. section 360bbb-3(b)(1), unless the authorization is terminated or revoked.     Resp Syncytial Virus by PCR NEGATIVE NEGATIVE Final    Comment: (NOTE) Fact Sheet for Patients: BloggerCourse.com  Fact Sheet for Healthcare Providers: SeriousBroker.it  This test is not yet approved or cleared by the [place]  FDA and has been authorized for detection and/or diagnosis of SARS-CoV-2 by FDA under an Emergency Use Authorization (EUA). This EUA will remain in effect (meaning this test can be used) for the duration of the COVID-19 declaration under Section 564(b)(1) of the Act, 21 U.S.C. section 360bbb-3(b)(1), unless the authorization is terminated or revoked.  Performed at [facility], [address]  [place], [place] [address]     Labs: CBC: Recent Labs  Lab 04/04/24 1358 04/05/24 0310  WBC 11.5* 10.4  NEUTROABS 8.9*  --   HGB 10.5* 10.2*  HCT 34.6* 32.4*  MCV 88.3 86.4  PLT 298 223   Basic Metabolic Panel: Recent Labs  Lab 04/04/24 1358 04/05/24 0310 04/06/24 0255 04/07/24 0322  NA 143 145 143 142  K 6.0* 5.1 4.8 5.0  CL 115* 119* 117* 117*  CO2 16* 18* 18* 18*  GLUCOSE 120* 110* 96 96  BUN 87* 88* 77* 73*  CREATININE 3.51* 3.60* 3.17* 2.95*  CALCIUM  8.5* 8.6* 8.1* 8.2*   Liver Function Tests: Recent Labs  Lab 04/04/24 1358 04/05/24 0310  AST 28 22  ALT 16 12  ALKPHOS 130* 120  BILITOT 0.9 0.5  PROT 6.5 5.8*  ALBUMIN  2.5* 2.3*   CBG: Recent Labs  Lab 04/06/24 1113 04/06/24 1628 04/06/24 2136 04/06/24 2141 04/07/24 0607  GLUCAP 108* 110* 105* 105* 92    Discharge time spent: greater than 30 minutes.  Signed: Adriana DELENA Grams, MD [facility] 04/07/2024

## 2024-04-08 DIAGNOSIS — N1832 Chronic kidney disease, stage 3b: Secondary | ICD-10-CM | POA: Diagnosis not present

## 2024-04-08 DIAGNOSIS — I4891 Unspecified atrial fibrillation: Secondary | ICD-10-CM | POA: Diagnosis not present

## 2024-04-08 DIAGNOSIS — R278 Other lack of coordination: Secondary | ICD-10-CM | POA: Diagnosis not present

## 2024-04-08 DIAGNOSIS — I480 Paroxysmal atrial fibrillation: Secondary | ICD-10-CM | POA: Diagnosis not present

## 2024-04-08 DIAGNOSIS — N179 Acute kidney failure, unspecified: Secondary | ICD-10-CM | POA: Diagnosis not present

## 2024-04-08 DIAGNOSIS — R2689 Other abnormalities of gait and mobility: Secondary | ICD-10-CM | POA: Diagnosis not present

## 2024-04-08 DIAGNOSIS — I5032 Chronic diastolic (congestive) heart failure: Secondary | ICD-10-CM | POA: Diagnosis not present

## 2024-04-08 DIAGNOSIS — E119 Type 2 diabetes mellitus without complications: Secondary | ICD-10-CM | POA: Diagnosis not present

## 2024-04-08 DIAGNOSIS — I5030 Unspecified diastolic (congestive) heart failure: Secondary | ICD-10-CM | POA: Diagnosis not present

## 2024-04-08 DIAGNOSIS — E875 Hyperkalemia: Secondary | ICD-10-CM | POA: Diagnosis not present

## 2024-04-08 DIAGNOSIS — I10 Essential (primary) hypertension: Secondary | ICD-10-CM | POA: Diagnosis not present

## 2024-04-08 DIAGNOSIS — M6281 Muscle weakness (generalized): Secondary | ICD-10-CM | POA: Diagnosis not present

## 2024-04-08 NOTE — Telephone Encounter (Signed)
 Left voicemail for patient to call the office back.    Pt has appt scheduled for 04/14/24 with pcp.

## 2024-04-10 DIAGNOSIS — E875 Hyperkalemia: Secondary | ICD-10-CM | POA: Diagnosis not present

## 2024-04-10 DIAGNOSIS — N179 Acute kidney failure, unspecified: Secondary | ICD-10-CM | POA: Diagnosis not present

## 2024-04-10 DIAGNOSIS — E119 Type 2 diabetes mellitus without complications: Secondary | ICD-10-CM | POA: Diagnosis not present

## 2024-04-10 DIAGNOSIS — I4891 Unspecified atrial fibrillation: Secondary | ICD-10-CM | POA: Diagnosis not present

## 2024-04-10 DIAGNOSIS — I5030 Unspecified diastolic (congestive) heart failure: Secondary | ICD-10-CM | POA: Diagnosis not present

## 2024-04-13 DIAGNOSIS — I5030 Unspecified diastolic (congestive) heart failure: Secondary | ICD-10-CM | POA: Diagnosis not present

## 2024-04-13 DIAGNOSIS — N179 Acute kidney failure, unspecified: Secondary | ICD-10-CM | POA: Diagnosis not present

## 2024-04-13 DIAGNOSIS — I4891 Unspecified atrial fibrillation: Secondary | ICD-10-CM | POA: Diagnosis not present

## 2024-04-13 DIAGNOSIS — N1832 Chronic kidney disease, stage 3b: Secondary | ICD-10-CM | POA: Diagnosis not present

## 2024-04-13 DIAGNOSIS — E875 Hyperkalemia: Secondary | ICD-10-CM | POA: Diagnosis not present

## 2024-04-13 DIAGNOSIS — I10 Essential (primary) hypertension: Secondary | ICD-10-CM | POA: Diagnosis not present

## 2024-04-13 DIAGNOSIS — E119 Type 2 diabetes mellitus without complications: Secondary | ICD-10-CM | POA: Diagnosis not present

## 2024-04-14 ENCOUNTER — Inpatient Hospital Stay: Admitting: Nurse Practitioner

## 2024-04-14 DIAGNOSIS — N1832 Chronic kidney disease, stage 3b: Secondary | ICD-10-CM | POA: Diagnosis not present

## 2024-04-14 DIAGNOSIS — N179 Acute kidney failure, unspecified: Secondary | ICD-10-CM | POA: Diagnosis not present

## 2024-04-14 DIAGNOSIS — E1122 Type 2 diabetes mellitus with diabetic chronic kidney disease: Secondary | ICD-10-CM | POA: Diagnosis not present

## 2024-04-14 DIAGNOSIS — I5030 Unspecified diastolic (congestive) heart failure: Secondary | ICD-10-CM | POA: Diagnosis not present

## 2024-04-14 DIAGNOSIS — E875 Hyperkalemia: Secondary | ICD-10-CM | POA: Diagnosis not present

## 2024-04-14 DIAGNOSIS — I4891 Unspecified atrial fibrillation: Secondary | ICD-10-CM | POA: Diagnosis not present

## 2024-04-15 DIAGNOSIS — R2689 Other abnormalities of gait and mobility: Secondary | ICD-10-CM | POA: Diagnosis not present

## 2024-04-15 DIAGNOSIS — I5032 Chronic diastolic (congestive) heart failure: Secondary | ICD-10-CM | POA: Diagnosis not present

## 2024-04-15 DIAGNOSIS — R278 Other lack of coordination: Secondary | ICD-10-CM | POA: Diagnosis not present

## 2024-04-15 DIAGNOSIS — I480 Paroxysmal atrial fibrillation: Secondary | ICD-10-CM | POA: Diagnosis not present

## 2024-04-15 DIAGNOSIS — M6281 Muscle weakness (generalized): Secondary | ICD-10-CM | POA: Diagnosis not present

## 2024-04-16 DIAGNOSIS — E875 Hyperkalemia: Secondary | ICD-10-CM | POA: Diagnosis not present

## 2024-04-16 DIAGNOSIS — N1832 Chronic kidney disease, stage 3b: Secondary | ICD-10-CM | POA: Diagnosis not present

## 2024-04-16 DIAGNOSIS — I5030 Unspecified diastolic (congestive) heart failure: Secondary | ICD-10-CM | POA: Diagnosis not present

## 2024-04-16 DIAGNOSIS — Z7901 Long term (current) use of anticoagulants: Secondary | ICD-10-CM | POA: Diagnosis not present

## 2024-04-16 DIAGNOSIS — N179 Acute kidney failure, unspecified: Secondary | ICD-10-CM | POA: Diagnosis not present

## 2024-04-16 DIAGNOSIS — I4891 Unspecified atrial fibrillation: Secondary | ICD-10-CM | POA: Diagnosis not present

## 2024-04-17 DIAGNOSIS — N179 Acute kidney failure, unspecified: Secondary | ICD-10-CM | POA: Diagnosis not present

## 2024-04-17 DIAGNOSIS — I482 Chronic atrial fibrillation, unspecified: Secondary | ICD-10-CM | POA: Diagnosis not present

## 2024-04-17 DIAGNOSIS — I509 Heart failure, unspecified: Secondary | ICD-10-CM | POA: Diagnosis not present

## 2024-04-17 NOTE — Patient Outreach (Signed)
 Complex Care Management   Visit Note  04/17/2024  Name:  Jake Gross MRN: [id number] DOB: 1940-10-18  Situation:  Per chart review patient discharged to [facility] SNF on 04/07/24.   Follow Up Plan:   Will request patient be transferred to Nestora Duos, RN case manager once discharged from SNF. Notified [name], RN post acute care manager that [facility] will follow patient once discharged.   Arvin Seip RN, BSN, CCM [employer], Population Health Case Manager Phone: [phone]

## 2024-04-22 DIAGNOSIS — I480 Paroxysmal atrial fibrillation: Secondary | ICD-10-CM | POA: Diagnosis not present

## 2024-04-22 DIAGNOSIS — N179 Acute kidney failure, unspecified: Secondary | ICD-10-CM | POA: Diagnosis not present

## 2024-04-22 DIAGNOSIS — I482 Chronic atrial fibrillation, unspecified: Secondary | ICD-10-CM | POA: Diagnosis not present

## 2024-04-22 DIAGNOSIS — Z7901 Long term (current) use of anticoagulants: Secondary | ICD-10-CM | POA: Diagnosis not present

## 2024-04-22 DIAGNOSIS — I5032 Chronic diastolic (congestive) heart failure: Secondary | ICD-10-CM | POA: Diagnosis not present

## 2024-04-22 DIAGNOSIS — R278 Other lack of coordination: Secondary | ICD-10-CM | POA: Diagnosis not present

## 2024-04-22 DIAGNOSIS — M6281 Muscle weakness (generalized): Secondary | ICD-10-CM | POA: Diagnosis not present

## 2024-04-22 DIAGNOSIS — N1832 Chronic kidney disease, stage 3b: Secondary | ICD-10-CM | POA: Diagnosis not present

## 2024-04-22 DIAGNOSIS — R2689 Other abnormalities of gait and mobility: Secondary | ICD-10-CM | POA: Diagnosis not present

## 2024-04-22 DIAGNOSIS — I5030 Unspecified diastolic (congestive) heart failure: Secondary | ICD-10-CM | POA: Diagnosis not present

## 2024-04-23 ENCOUNTER — Other Ambulatory Visit: Payer: Self-pay | Admitting: *Deleted

## 2024-04-23 NOTE — Patient Outreach (Signed)
 [name] resides in [facility]. [name] is active with [facility] CCM team.  Telephone call made to [facility] to speak with Child psychotherapist. However, Abigail (new Short SW) unavailable.  Telephone call made to [name].  No answer. HIPAA compliant voicemail message left to request return call.  sE

## 2024-04-25 ENCOUNTER — Ambulatory Visit: Admitting: Nurse Practitioner

## 2024-04-26 DIAGNOSIS — J189 Pneumonia, unspecified organism: Secondary | ICD-10-CM | POA: Diagnosis not present

## 2024-04-26 DIAGNOSIS — R0989 Other specified symptoms and signs involving the circulatory and respiratory systems: Secondary | ICD-10-CM | POA: Diagnosis not present

## 2024-04-28 ENCOUNTER — Other Ambulatory Visit: Payer: Self-pay

## 2024-04-28 ENCOUNTER — Emergency Department (HOSPITAL_COMMUNITY)

## 2024-04-28 ENCOUNTER — Inpatient Hospital Stay (HOSPITAL_COMMUNITY)
Admission: EM | Admit: 2024-04-28 | Discharge: 2024-05-06 | DRG: 683 | Disposition: A | Discharge status: Other Acute Inpatient Hospital | Source: Skilled Nursing Facility | Attending: Internal Medicine | Admitting: Internal Medicine

## 2024-04-28 ENCOUNTER — Other Ambulatory Visit: Payer: Self-pay | Admitting: *Deleted

## 2024-04-28 ENCOUNTER — Encounter (HOSPITAL_COMMUNITY): Payer: Self-pay

## 2024-04-28 ENCOUNTER — Inpatient Hospital Stay (HOSPITAL_COMMUNITY)

## 2024-04-28 DIAGNOSIS — Z87891 Personal history of nicotine dependence: Secondary | ICD-10-CM

## 2024-04-28 DIAGNOSIS — Z79899 Other long term (current) drug therapy: Secondary | ICD-10-CM

## 2024-04-28 DIAGNOSIS — Z66 Do not resuscitate: Secondary | ICD-10-CM | POA: Diagnosis not present

## 2024-04-28 DIAGNOSIS — J189 Pneumonia, unspecified organism: Secondary | ICD-10-CM

## 2024-04-28 DIAGNOSIS — Z8701 Personal history of pneumonia (recurrent): Secondary | ICD-10-CM

## 2024-04-28 DIAGNOSIS — Z515 Encounter for palliative care: Secondary | ICD-10-CM

## 2024-04-28 DIAGNOSIS — R32 Unspecified urinary incontinence: Secondary | ICD-10-CM | POA: Diagnosis not present

## 2024-04-28 DIAGNOSIS — C801 Malignant (primary) neoplasm, unspecified: Secondary | ICD-10-CM | POA: Diagnosis not present

## 2024-04-28 DIAGNOSIS — L8990 Pressure ulcer of unspecified site, unspecified stage: Secondary | ICD-10-CM | POA: Insufficient documentation

## 2024-04-28 DIAGNOSIS — Z860101 Personal history of adenomatous and serrated colon polyps: Secondary | ICD-10-CM

## 2024-04-28 DIAGNOSIS — R339 Retention of urine, unspecified: Secondary | ICD-10-CM | POA: Diagnosis not present

## 2024-04-28 DIAGNOSIS — R599 Enlarged lymph nodes, unspecified: Secondary | ICD-10-CM | POA: Diagnosis present

## 2024-04-28 DIAGNOSIS — E1122 Type 2 diabetes mellitus with diabetic chronic kidney disease: Secondary | ICD-10-CM | POA: Diagnosis not present

## 2024-04-28 DIAGNOSIS — Z8673 Personal history of transient ischemic attack (TIA), and cerebral infarction without residual deficits: Secondary | ICD-10-CM | POA: Diagnosis not present

## 2024-04-28 DIAGNOSIS — Z951 Presence of aortocoronary bypass graft: Secondary | ICD-10-CM

## 2024-04-28 DIAGNOSIS — E875 Hyperkalemia: Secondary | ICD-10-CM | POA: Diagnosis present

## 2024-04-28 DIAGNOSIS — X58XXXA Exposure to other specified factors, initial encounter: Secondary | ICD-10-CM | POA: Diagnosis present

## 2024-04-28 DIAGNOSIS — I4430 Unspecified atrioventricular block: Secondary | ICD-10-CM | POA: Diagnosis not present

## 2024-04-28 DIAGNOSIS — N184 Chronic kidney disease, stage 4 (severe): Secondary | ICD-10-CM | POA: Diagnosis present

## 2024-04-28 DIAGNOSIS — Z8249 Family history of ischemic heart disease and other diseases of the circulatory system: Secondary | ICD-10-CM | POA: Diagnosis not present

## 2024-04-28 DIAGNOSIS — D631 Anemia in chronic kidney disease: Secondary | ICD-10-CM | POA: Diagnosis present

## 2024-04-28 DIAGNOSIS — I5032 Chronic diastolic (congestive) heart failure: Secondary | ICD-10-CM | POA: Diagnosis present

## 2024-04-28 DIAGNOSIS — R278 Other lack of coordination: Secondary | ICD-10-CM | POA: Diagnosis not present

## 2024-04-28 DIAGNOSIS — C786 Secondary malignant neoplasm of retroperitoneum and peritoneum: Secondary | ICD-10-CM | POA: Diagnosis present

## 2024-04-28 DIAGNOSIS — I2510 Atherosclerotic heart disease of native coronary artery without angina pectoris: Secondary | ICD-10-CM | POA: Diagnosis not present

## 2024-04-28 DIAGNOSIS — R5381 Other malaise: Secondary | ICD-10-CM | POA: Diagnosis present

## 2024-04-28 DIAGNOSIS — Z7401 Bed confinement status: Secondary | ICD-10-CM | POA: Diagnosis not present

## 2024-04-28 DIAGNOSIS — Z794 Long term (current) use of insulin: Secondary | ICD-10-CM | POA: Diagnosis not present

## 2024-04-28 DIAGNOSIS — Z7189 Other specified counseling: Secondary | ICD-10-CM | POA: Diagnosis not present

## 2024-04-28 DIAGNOSIS — R5383 Other fatigue: Secondary | ICD-10-CM | POA: Diagnosis not present

## 2024-04-28 DIAGNOSIS — N179 Acute kidney failure, unspecified: Principal | ICD-10-CM | POA: Diagnosis present

## 2024-04-28 DIAGNOSIS — S42002A Fracture of unspecified part of left clavicle, initial encounter for closed fracture: Secondary | ICD-10-CM | POA: Diagnosis not present

## 2024-04-28 DIAGNOSIS — R2689 Other abnormalities of gait and mobility: Secondary | ICD-10-CM | POA: Diagnosis not present

## 2024-04-28 DIAGNOSIS — R188 Other ascites: Secondary | ICD-10-CM | POA: Diagnosis present

## 2024-04-28 DIAGNOSIS — I10 Essential (primary) hypertension: Secondary | ICD-10-CM | POA: Diagnosis not present

## 2024-04-28 DIAGNOSIS — R531 Weakness: Secondary | ICD-10-CM | POA: Diagnosis not present

## 2024-04-28 DIAGNOSIS — M6281 Muscle weakness (generalized): Secondary | ICD-10-CM | POA: Diagnosis not present

## 2024-04-28 DIAGNOSIS — J90 Pleural effusion, not elsewhere classified: Secondary | ICD-10-CM | POA: Diagnosis present

## 2024-04-28 DIAGNOSIS — E861 Hypovolemia: Secondary | ICD-10-CM | POA: Diagnosis not present

## 2024-04-28 DIAGNOSIS — R197 Diarrhea, unspecified: Secondary | ICD-10-CM | POA: Diagnosis not present

## 2024-04-28 DIAGNOSIS — A419 Sepsis, unspecified organism: Secondary | ICD-10-CM | POA: Diagnosis not present

## 2024-04-28 DIAGNOSIS — I130 Hypertensive heart and chronic kidney disease with heart failure and stage 1 through stage 4 chronic kidney disease, or unspecified chronic kidney disease: Secondary | ICD-10-CM | POA: Diagnosis not present

## 2024-04-28 DIAGNOSIS — F419 Anxiety disorder, unspecified: Secondary | ICD-10-CM | POA: Diagnosis not present

## 2024-04-28 DIAGNOSIS — Z823 Family history of stroke: Secondary | ICD-10-CM

## 2024-04-28 DIAGNOSIS — E785 Hyperlipidemia, unspecified: Secondary | ICD-10-CM | POA: Diagnosis present

## 2024-04-28 DIAGNOSIS — D696 Thrombocytopenia, unspecified: Secondary | ICD-10-CM | POA: Diagnosis present

## 2024-04-28 DIAGNOSIS — Z7901 Long term (current) use of anticoagulants: Secondary | ICD-10-CM

## 2024-04-28 DIAGNOSIS — J168 Pneumonia due to other specified infectious organisms: Secondary | ICD-10-CM | POA: Diagnosis not present

## 2024-04-28 DIAGNOSIS — N1832 Chronic kidney disease, stage 3b: Secondary | ICD-10-CM

## 2024-04-28 DIAGNOSIS — R638 Other symptoms and signs concerning food and fluid intake: Secondary | ICD-10-CM | POA: Diagnosis not present

## 2024-04-28 DIAGNOSIS — J9811 Atelectasis: Secondary | ICD-10-CM | POA: Diagnosis not present

## 2024-04-28 DIAGNOSIS — R451 Restlessness and agitation: Secondary | ICD-10-CM | POA: Diagnosis not present

## 2024-04-28 DIAGNOSIS — I480 Paroxysmal atrial fibrillation: Secondary | ICD-10-CM | POA: Diagnosis not present

## 2024-04-28 DIAGNOSIS — R918 Other nonspecific abnormal finding of lung field: Secondary | ICD-10-CM | POA: Diagnosis not present

## 2024-04-28 DIAGNOSIS — J849 Interstitial pulmonary disease, unspecified: Secondary | ICD-10-CM | POA: Diagnosis not present

## 2024-04-28 DIAGNOSIS — E119 Type 2 diabetes mellitus without complications: Secondary | ICD-10-CM | POA: Diagnosis not present

## 2024-04-28 DIAGNOSIS — N320 Bladder-neck obstruction: Secondary | ICD-10-CM | POA: Diagnosis not present

## 2024-04-28 DIAGNOSIS — Z833 Family history of diabetes mellitus: Secondary | ICD-10-CM

## 2024-04-28 DIAGNOSIS — E872 Acidosis, unspecified: Secondary | ICD-10-CM | POA: Diagnosis not present

## 2024-04-28 LAB — COMPREHENSIVE METABOLIC PANEL WITH GFR
ALT: 22 U/L (ref 0–44)
AST: 51 U/L — ABNORMAL HIGH (ref 15–41)
Albumin: 1.8 g/dL — ABNORMAL LOW (ref 3.5–5.0)
Alkaline Phosphatase: 208 U/L — ABNORMAL HIGH (ref 38–126)
Anion gap: 15 (ref 5–15)
BUN: 110 mg/dL — ABNORMAL HIGH (ref 8–23)
CO2: 14 mmol/L — ABNORMAL LOW (ref 22–32)
Calcium: 8.1 mg/dL — ABNORMAL LOW (ref 8.9–10.3)
Chloride: 108 mmol/L (ref 98–111)
Creatinine, Ser: 4.96 mg/dL — ABNORMAL HIGH (ref 0.61–1.24)
GFR, Estimated: 11 mL/min — ABNORMAL LOW (ref >60)
Glucose, Bld: 174 mg/dL — ABNORMAL HIGH (ref 70–99)
Potassium: 6.1 mmol/L — ABNORMAL HIGH (ref 3.5–5.1)
Sodium: 137 mmol/L (ref 135–145)
Total Bilirubin: 0.8 mg/dL (ref 0.0–1.2)
Total Protein: 6.2 g/dL — ABNORMAL LOW (ref 6.5–8.1)

## 2024-04-28 LAB — CBC WITH DIFFERENTIAL/PLATELET
Abs Immature Granulocytes: 0.23 K/uL — ABNORMAL HIGH (ref 0.00–0.07)
Basophils Absolute: 0 K/uL (ref 0.0–0.1)
Basophils Relative: 0 %
Eosinophils Absolute: 0 K/uL (ref 0.0–0.5)
Eosinophils Relative: 0 %
HCT: 38.2 % — ABNORMAL LOW (ref 39.0–52.0)
Hemoglobin: 12 g/dL — ABNORMAL LOW (ref 13.0–17.0)
Immature Granulocytes: 2 %
Lymphocytes Relative: 5 %
Lymphs Abs: 0.7 K/uL (ref 0.7–4.0)
MCH: 25.8 pg — ABNORMAL LOW (ref 26.0–34.0)
MCHC: 31.4 g/dL (ref 30.0–36.0)
MCV: 82.2 fL (ref 80.0–100.0)
Monocytes Absolute: 1.3 K/uL — ABNORMAL HIGH (ref 0.1–1.0)
Monocytes Relative: 9 %
Neutro Abs: 12.1 K/uL — ABNORMAL HIGH (ref 1.7–7.7)
Neutrophils Relative %: 84 %
Platelets: 107 K/uL — ABNORMAL LOW (ref 150–400)
RBC: 4.65 MIL/uL (ref 4.22–5.81)
RDW: 15.4 % (ref 11.5–15.5)
WBC: 14.3 K/uL — ABNORMAL HIGH (ref 4.0–10.5)
nRBC: 0.4 % — ABNORMAL HIGH (ref 0.0–0.2)

## 2024-04-28 LAB — PROTIME-INR
INR: 1.7 — ABNORMAL HIGH (ref 0.8–1.2)
Prothrombin Time: 20.4 s — ABNORMAL HIGH (ref 11.4–15.2)

## 2024-04-28 LAB — POTASSIUM: Potassium: 5.9 mmol/L — ABNORMAL HIGH (ref 3.5–5.1)

## 2024-04-28 LAB — CG4 I-STAT (LACTIC ACID): Lactic Acid, Venous: 2.2 mmol/L (ref 0.5–1.9)

## 2024-04-28 LAB — BRAIN NATRIURETIC PEPTIDE: B Natriuretic Peptide: 103.2 pg/mL — ABNORMAL HIGH (ref 0.0–100.0)

## 2024-04-28 LAB — PROCALCITONIN: Procalcitonin: 2.68 ng/mL

## 2024-04-28 LAB — GLUCOSE, CAPILLARY: Glucose-Capillary: 193 mg/dL — ABNORMAL HIGH (ref 70–99)

## 2024-04-28 MED ORDER — DEXTROSE 50 % IV SOLN
1.0000 | Freq: Once | INTRAVENOUS | Status: AC
  Administered 2024-04-28: 50 mL via INTRAVENOUS
  Filled 2024-04-28: qty 50

## 2024-04-28 MED ORDER — SODIUM CHLORIDE 0.9 % IV SOLN
2.0000 g | INTRAVENOUS | Status: DC
  Administered 2024-04-28 – 2024-04-29 (×2): 2 g via INTRAVENOUS
  Filled 2024-04-28 (×2): qty 20

## 2024-04-28 MED ORDER — SODIUM CHLORIDE 0.9 % IV SOLN
500.0000 mg | INTRAVENOUS | Status: DC
  Administered 2024-04-28: 500 mg via INTRAVENOUS
  Filled 2024-04-28: qty 5

## 2024-04-28 MED ORDER — SODIUM CHLORIDE 0.9 % IV SOLN
2.0000 g | Freq: Three times a day (TID) | INTRAVENOUS | Status: DC
  Administered 2024-04-28: 2 g via INTRAVENOUS
  Filled 2024-04-28: qty 12.5

## 2024-04-28 MED ORDER — ACETAMINOPHEN 325 MG PO TABS: 650.0000 mg | ORAL_TABLET | Freq: Four times a day (QID) | ORAL | Status: DC | PRN

## 2024-04-28 MED ORDER — STERILE WATER FOR INJECTION IV SOLN
INTRAVENOUS | Status: AC
  Filled 2024-04-28 (×2): qty 150

## 2024-04-28 MED ORDER — ACETAMINOPHEN 650 MG RE SUPP: 650.0000 mg | Freq: Four times a day (QID) | RECTAL | Status: DC | PRN

## 2024-04-28 MED ORDER — SODIUM ZIRCONIUM CYCLOSILICATE 10 G PO PACK
10.0000 g | PACK | Freq: Once | ORAL | Status: AC
  Administered 2024-04-28: 10 g via ORAL
  Filled 2024-04-28: qty 1

## 2024-04-28 MED ORDER — VANCOMYCIN HCL 1500 MG/300ML IV SOLN
1500.0000 mg | Freq: Once | INTRAVENOUS | Status: AC
  Administered 2024-04-28: 1500 mg via INTRAVENOUS
  Filled 2024-04-28: qty 300

## 2024-04-28 MED ORDER — SODIUM CHLORIDE 0.9 % IV BOLUS
500.0000 mL | Freq: Once | INTRAVENOUS | Status: AC
  Administered 2024-04-28: 500 mL via INTRAVENOUS

## 2024-04-28 MED ORDER — SENNA 8.6 MG PO TABS: 1.0000 | ORAL_TABLET | Freq: Every day | ORAL | Status: DC | PRN

## 2024-04-28 MED ORDER — HEPARIN SODIUM (PORCINE) 5000 UNIT/ML IJ SOLN
5000.0000 [IU] | Freq: Three times a day (TID) | INTRAMUSCULAR | Status: DC
  Administered 2024-04-28 – 2024-04-29 (×3): 5000 [IU] via SUBCUTANEOUS
  Filled 2024-04-28 (×3): qty 1

## 2024-04-28 MED ORDER — PROCHLORPERAZINE EDISYLATE 10 MG/2ML IJ SOLN
5.0000 mg | Freq: Four times a day (QID) | INTRAMUSCULAR | Status: DC | PRN
  Administered 2024-04-30 – 2024-05-04 (×3): 5 mg via INTRAVENOUS
  Filled 2024-04-28 (×3): qty 2

## 2024-04-28 MED ORDER — INSULIN ASPART 100 UNIT/ML IV SOLN
3.0000 [IU] | Freq: Once | INTRAVENOUS | Status: AC
  Administered 2024-04-28: 3 [IU] via INTRAVENOUS
  Filled 2024-04-28: qty 0.03

## 2024-04-28 MED ORDER — INSULIN ASPART 100 UNIT/ML IJ SOLN
0.0000 [IU] | Freq: Every day | INTRAMUSCULAR | Status: DC
  Filled 2024-04-28: qty 0.05

## 2024-04-28 MED ORDER — SODIUM CHLORIDE 0.9% FLUSH
3.0000 mL | Freq: Two times a day (BID) | INTRAVENOUS | Status: DC
  Administered 2024-04-28 – 2024-05-05 (×15): 3 mL via INTRAVENOUS

## 2024-04-28 MED ORDER — INSULIN ASPART 100 UNIT/ML IJ SOLN
0.0000 [IU] | Freq: Three times a day (TID) | INTRAMUSCULAR | Status: DC
  Filled 2024-04-28: qty 0.06

## 2024-04-28 NOTE — ED Provider Notes (Signed)
 [place] EMERGENCY DEPARTMENT AT [facility] Provider Note   CSN: [id number] Arrival date & time: 04/28/24  1630     Patient presents with: Fatigue   Jake Gross is a 83 y.o. male.   HPI    Denies all complaints at this point time.  He denies any headaches.  No fever no chills.  When does endorse a cough.  No chest pain.  No nausea vomit diarrhea.  No abdominal pain.  He states he is not really clear why he is here at this moment in time.  He is not sure when the last time he urinated.  He feels like his bowel movements been normal.   Patient resides at [facility] and rehab.   Previous medical history reviewed : Was last discharged on April 07, 2024.  Was admitted for weakness.  HFpEF.  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  Last echo in 03/22/2024.  EF of 60 to 65%.     Prior to Admission medications   Medication Sig Start Date End Date Taking? Authorizing Provider  ACCU-CHEK GUIDE test strip 1 EACH BY OTHER ROUTE 3 (THREE) TIMES DAILY AS NEEDED FOR OTHER. USE AS INSTRUCTED 04/23/23   Wendee Lynwood HERO, NP  acetaminophen  (TYLENOL ) 325 MG tablet Take 2 tablets (650 mg total) by mouth every 6 (six) hours as needed for mild pain (pain score 1-3) (or Fever >/= 101). 03/25/24   Emil Share, DO  apixaban  (ELIQUIS ) 2.5 MG TABS tablet Take 1 tablet (2.5 mg total) by mouth 2 (two) times daily. 03/25/24   Emil Share, DO  Continuous Glucose Receiver (FREESTYLE LIBRE 2 READER) DEVI Use to check blood sugars. 03/30/23   Wendee Lynwood HERO, NP  Continuous Glucose Sensor (FREESTYLE LIBRE 2 SENSOR) MISC Apply every 14 days to check blood sugars. 03/30/23   Wendee Lynwood HERO, NP  dapagliflozin  propanediol (FARXIGA ) 10 MG TABS tablet Take 10 mg by mouth daily. 03/25/24   [provider]  isosorbide  mononitrate (IMDUR ) 60 MG 24 hr tablet Take 1.5 tablets (90 mg total) by mouth daily. 03/25/24 06/23/24  Emil Share, DO  Lancets [facility] ULTRASOFT) lancets Use as instructed 04/27/21    Kassie Mallick, MD  ondansetron  (ZOFRAN ) 4 MG tablet Take 4 mg by mouth every 6 (six) hours as needed for nausea or vomiting. 03/28/24   [provider]  rosuvastatin  (CRESTOR ) 20 MG tablet TAKE 1 TABLET BY MOUTH AT BEDTIME. SCHEDULE PHYSICAL EXAM 03/25/24   Emil Share, DO  torsemide  (DEMADEX ) 20 MG tablet TAKE 1 TABLET BY MOUTH TWICE A DAY ON MONDAYS AND THURSDAYS ONLY, TAKE 1 TABLET BY MOUTH DAILY ONLY ALL OTHER DAYS Patient not taking: Reported on 04/04/2024 03/25/24   Emil Share, DO    Allergies: Patient has no known allergies.    Review of Systems  Constitutional:  Negative for chills and fever.  HENT:  Negative for ear pain and sore throat.   Eyes:  Negative for pain and visual disturbance.  Respiratory:  Negative for cough and shortness of breath.   Cardiovascular:  Negative for chest pain and palpitations.  Gastrointestinal:  Negative for abdominal pain and vomiting.  Genitourinary:  Negative for dysuria and hematuria.  Musculoskeletal:  Negative for arthralgias and back pain.  Skin:  Negative for color change and rash.  Neurological:  Negative for seizures and syncope.  All other systems reviewed and are negative.   Updated Vital Signs BP 116/76 (BP Location: Right Arm)   Pulse 85  Temp 97.6 F (36.4 C) (Oral)   Resp 20   Ht 5' 7 (1.702 m)   Wt 77.2 kg   SpO2 100%   BMI 26.66 kg/m   Physical Exam Vitals and nursing note reviewed.  Constitutional:      General: He is not in acute distress.    Appearance: He is well-developed.  HENT:     Head: Normocephalic and atraumatic.  Eyes:     Conjunctiva/sclera: Conjunctivae normal.  Cardiovascular:     Rate and Rhythm: Normal rate and regular rhythm.     Heart sounds: No murmur heard. Pulmonary:     Effort: Pulmonary effort is normal. No respiratory distress.     Breath sounds: Normal breath sounds.  Abdominal:     Palpations: Abdomen is soft.     Tenderness: There is no abdominal tenderness.   Musculoskeletal:        General: No swelling.     Cervical back: Neck supple.  Skin:    General: Skin is warm and dry.     Capillary Refill: Capillary refill takes less than 2 seconds.  Neurological:     Mental Status: He is alert.     Comments: Alert to self, place.  No focal deficits.  Psychiatric:        Mood and Affect: Mood normal.     (all labs ordered are listed, but only abnormal results are displayed) Labs Reviewed  COMPREHENSIVE METABOLIC PANEL WITH GFR - Abnormal; Notable for the following components:      Result Value   Potassium 6.1 (*)    CO2 14 (*)    Glucose, Bld 174 (*)    BUN 110 (*)    Creatinine, Ser 4.96 (*)    Calcium  8.1 (*)    Total Protein 6.2 (*)    Albumin  1.8 (*)    AST 51 (*)    Alkaline Phosphatase 208 (*)    GFR, Estimated 11 (*)    All other components within normal limits  CBC WITH DIFFERENTIAL/PLATELET - Abnormal; Notable for the following components:   WBC 14.3 (*)    Hemoglobin 12.0 (*)    HCT 38.2 (*)    MCH 25.8 (*)    Platelets 107 (*)    nRBC 0.4 (*)    Neutro Abs 12.1 (*)    Monocytes Absolute 1.3 (*)    Abs Immature Granulocytes 0.23 (*)    All other components within normal limits  PROTIME-INR - Abnormal; Notable for the following components:   Prothrombin Time 20.4 (*)    INR 1.7 (*)    All other components within normal limits  POTASSIUM - Abnormal; Notable for the following components:   Potassium 5.9 (*)    All other components within normal limits  BRAIN NATRIURETIC PEPTIDE - Abnormal; Notable for the following components:   B Natriuretic Peptide 103.2 (*)    All other components within normal limits  GLUCOSE, CAPILLARY - Abnormal; Notable for the following components:   Glucose-Capillary 193 (*)    All other components within normal limits  CG4 I-STAT (LACTIC ACID) - Abnormal; Notable for the following components:   Lactic Acid, Venous 2.2 (*)    All other components within normal limits  CULTURE, BLOOD  (ROUTINE X 2)  CULTURE, BLOOD (ROUTINE X 2)  MRSA NEXT GEN BY PCR, NASAL  PROCALCITONIN  URINALYSIS, W/ REFLEX TO CULTURE (INFECTION SUSPECTED)  SODIUM, URINE, RANDOM  CREATININE, URINE, RANDOM  UREA  NITROGEN, URINE  POTASSIUM  POTASSIUM  POTASSIUM  BASIC METABOLIC PANEL WITH GFR  HEPATIC FUNCTION PANEL  CBC  I-STAT CG4 LACTIC ACID, ED  I-STAT CG4 LACTIC ACID, ED    EKG: EKG Interpretation Date/Time:  Monday April 28 2024 18:14:00 EDT Ventricular Rate:  81 PR Interval:  190 QRS Duration:  94 QT Interval:  368 QTC Calculation: 427 R Axis:   61  Text Interpretation: Normal sinus rhythm Abnormal ECG When compared with ECG of 04-Apr-2024 13:15, PREVIOUS ECG IS PRESENT Confirmed by Simon Rea [phone]) on 04/28/2024 7:29:46 PM  Radiology: [facility] Chest Port 1 View Result Date: 04/28/2024 CLINICAL DATA:  Sepsis, fatigue. EXAM: PORTABLE CHEST 1 VIEW COMPARISON:  April 04, 2024. FINDINGS: Stable cardiomediastinal silhouette. Status post coronary artery bypass graft. Left lung is clear. Mild diffuse opacification of right lung is noted most consistent with layering pleural effusion and probable associated subsegmental atelectasis. Old left clavicular fracture is again noted. IMPRESSION: Mild diffuse opacification of right lung is noted consistent with layering pleural effusion and probable associated subsegmental atelectasis. Electronically Signed   By: Lynwood Landy Raddle M.D.   On: 04/28/2024 17:57     Procedures   Medications Ordered in the ED  insulin  aspart (novoLOG ) injection 0-6 Units (has no administration in time range)  insulin  aspart (novoLOG ) injection 0-5 Units ( Subcutaneous Not Given 04/28/24 2228)  heparin  injection 5,000 Units (5,000 Units Subcutaneous Given 04/28/24 2226)  sodium chloride  flush (NS) 0.9 % injection 3 mL (3 mLs Intravenous Given 04/28/24 2228)  sodium bicarbonate  150 mEq in sterile water  1,150 mL infusion ( Intravenous New Bag/Given 04/28/24 2215)  acetaminophen   (TYLENOL ) tablet 650 mg (has no administration in time range)    Or  acetaminophen  (TYLENOL ) suppository 650 mg (has no administration in time range)  prochlorperazine  (COMPAZINE ) injection 5 mg (has no administration in time range)  senna (SENOKOT) tablet 8.6 mg (has no administration in time range)  cefTRIAXone  (ROCEPHIN ) 2 g in sodium chloride  0.9 % 100 mL IVPB (2 g Intravenous New Bag/Given 04/28/24 2226)  azithromycin  (ZITHROMAX ) 500 mg in sodium chloride  0.9 % 250 mL IVPB (500 mg Intravenous New Bag/Given 04/28/24 2258)  sodium chloride  0.9 % bolus 500 mL (500 mLs Intravenous New Bag/Given 04/28/24 1947)  insulin  aspart (novoLOG ) injection 3 Units (3 Units Intravenous Given 04/28/24 1947)    And  dextrose  50 % solution 50 mL (50 mLs Intravenous Given 04/28/24 1947)  vancomycin  (VANCOREADY) IVPB 1500 mg/300 mL (1,500 mg Intravenous New Bag/Given 04/28/24 1955)  sodium zirconium cyclosilicate  (LOKELMA ) packet 10 g (10 g Oral Given 04/28/24 2234)    Clinical Course as of 04/28/24 2302  Mon Apr 28, 2024  1941 Stay at [facility]  [TL]    Clinical Course User Index [TL] Simon Rea SAILOR, MD                                 Medical Decision Making Amount and/or Complexity of Data Reviewed Labs: ordered. Radiology: ordered.  Risk OTC drugs. Prescription drug management. Decision regarding hospitalization.    Patient resides at [facility] and rehab.Denies all complaints at this point time.  He denies any headaches.  No fever no chills.  When does endorse a cough.  No chest pain.  No nausea vomit diarrhea.  No abdominal pain.  He states he is not really clear why he is here at this moment in time.  He is not sure when the last time he urinated.  He feels like his bowel movements been normal.   Previous medical history reviewed : Was last discharged on April 07, 2024.  Was admitted for weakness.  HFpEF.  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  Last echo in 03/22/2024.  EF of 60 to  65%.  Upon exam, patient alert and oriented x 3.  Patient GCS 14.  Eyes were closed but would awaken upon command.  No focal deficits.  Cranials 2 through 12 intact.  Strength and sensation intact in bilateral upper and lower extremities.  No clear signs of CVA.   In terms of patient's electrolyte derangements, potassium 6.1.  EKG reviewed.  No peaked T waves or prolonged QRS.  No indication for calcium  gluconate.  Potassium being driven by AKI.  Creatinine 4.96.  Bicarb of 14.  Worsening AKI on top of CKD.  Given this, we will give Lokelma .  Spoke to Dr. Macel from Nephrology. Can stay at [facility] based off of creatinine and hemodynamics.  Recommended Lokelma  over Kayexalate .  Does seem somewhat dry on exam.  Cracked lips.  Patient was admitted this last day for AKI in the setting of dehydration.  Will start with a gentle 500 cc of fluid and reassess.  Somewhat hesitant to fluid overload the patient with his pleural effusion on the right side.   In terms of infectious etiology, patient leukocytosis and left shift.  Elevated lactic acid at 2.2.  Blood cultures obtained.  Start patient on cefepime  given the x-ray that showed possible layered pleural effusion on the right side.  On room air at this point time with O2 saturation 96%.  Started cefepime  in the setting of concern for hospital-acquired pneumonia given recent hospital stay.  Placed order for vancomycin  consult for pharmacy in setting of patient's AKI.  Will cover for MRSA acquired pneumonia in setting of recent hospital stay.     CRITICAL CARE Performed by: Lavonia LOISE Pat   Total critical care time: 45 minutes  Critical care time was exclusive of separately billable procedures and treating other patients.  Critical care was necessary to treat or prevent imminent or life-threatening deterioration.  Critical care was time spent personally by me on the following activities: development of treatment plan with patient and/or  surrogate as well as nursing, discussions with consultants, evaluation of patient's response to treatment, examination of patient, obtaining history from patient or surrogate, ordering and performing treatments and interventions, ordering and review of laboratory studies, ordering and review of radiographic studies, pulse oximetry and re-evaluation of patient's condition.        Final diagnoses:  AKI (acute kidney injury) (HCC)  Hyperkalemia  Pneumonia of right lung due to infectious organism, unspecified part of lung    ED Discharge Orders     None          Pat Lavonia LOISE, MD 04/28/24 2302

## 2024-04-28 NOTE — Progress Notes (Signed)
 Pharmacy Note   A consult was received from an ED physician for vancomycin  per pharmacy dosing.    The patient's profile has been reviewed for ht/wt/allergies/indication/available labs.    A one time order has been placed for vancomycin  1500 mg IV x1 .    Further antibiotics/pharmacy consults should be ordered by admitting physician if indicated.                       Thank you,  Lavin Petteway, PharmD, BCPS 04/28/2024 7:34 PM

## 2024-04-28 NOTE — ED Triage Notes (Signed)
 Pt bib EMS from [facility] through weekend. Elevated creatine. Chest xray confirmed pneumonia. 98% RA. 250 bolus NS 20 L hand. A&Ox4 and walks with walker

## 2024-04-28 NOTE — Patient Outreach (Addendum)
 Post-Acute Care Manager follow up. [name] resides in [facility] and [place]. He is active with [facility] CCM team.  Update received from Jake Gross, Jake Gross [facility] social worker. [name] has an anticipated discharge date of July 18th.  Writer inquired about home health arrangements. Will await response.  Will update [facility] CCM team.  Addendum: Secure update received from Jake Gross, SNF social worker, indicating [name] will be arranged.  Jake Hurst, MSN, RN, BSN Jake Gross  [facility] RN Post- Acute Care Manager Direct Dial: [phone]

## 2024-04-28 NOTE — H&P (Signed)
 History and Physical    Jarell Mcewen Umeda FMW:[id number] DOB: [birth year] DOA: 04/28/2024  PCP: Wendee Lynwood HERO, NP   Patient coming from: SNF  Chief Complaint: Lethargy   HPI: Khadar Monger Menden is a 83 y.o. male with medical history significant for HTN, DM, HLD, CAD x/p CABG, chronic HFpEF, PAF on Eliquis , and CKD IV who presents with lethargy and abnormal chest X-ray.   Patient was recently admitted with AKI, had BP meds held and was given IVF with improvement, and had ARB, torsemide , and Farxiga  held on discharge.   He has since developed worsening lethargy at the SNF where a CXR was obtained and said to reveal pneumonia. EMS was called and he was given 250 mL IVF prior to arrival in the ED.   The patient denies loss of appetite, N/V/D, chest pain, or abdominal pain. He reports SOB and cough for weeks.    ED Course: Upon arrival to the ED, patient is found to be afebrile and saturating well on room air with normal HR and stable BP. Labs are most notable for SCr 4.96, potassium 6.1, serum bicarbonate 14, albumin  1.8, WBC 14.3, and platelets 107k.   Nephrology (Dr. Macel) was consulted by the ED physician, blood cultures were collected, and the patient was given 500 mL NS, insulin  with dextrose , Lokelma , vancomycin , and cefepime .   Review of Systems:  All other systems reviewed and apart from HPI, are negative.  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   CAD (coronary artery disease) 09/29/2019   Cervical spondylosis    Chronic anticoagulation    Chronic diastolic CHF (congestive heart failure) (HCC) 12/17/2018   CKD (chronic kidney disease) 12/17/2018   Diabetes mellitus    History of GI bleed 09/29/2019   Hyperlipidemia     Hypertension    Iron deficiency anemia due to chronic blood loss    Paroxysmal atrial fibrillation (HCC) 12/17/2018   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 NO ISCHEMIA   COLONOSCOPY W/ POLYPECTOMY     5 polyps   COLONOSCOPY WITH PROPOFOL  N/A 08/29/2019   Procedure: COLONOSCOPY WITH PROPOFOL ;  Surgeon: Legrand Victory LITTIE DOUGLAS, MD;  Location: [facility] ENDOSCOPY;  Service: Gastroenterology;  Laterality: N/A;   CORONARY ARTERY BYPASS GRAFT N/A 11/26/2018   Procedure: CORONARY ARTERY BYPASS GRAFTING (CABG), ON PUMP, TIMES FOUR, USING LEFT INTERNAL MAMMARY ARTERY AND ENDOSCOPICALLY HARVESTED LEFT SAPHENOUS VEIN;  Surgeon: Lucas Dorise POUR, MD;  Location: [facility] OR;  Service: Open Heart Surgery;  Laterality: N/A;   ESOPHAGOGASTRODUODENOSCOPY (EGD) WITH PROPOFOL  N/A 08/29/2019   Procedure: ESOPHAGOGASTRODUODENOSCOPY (EGD) WITH PROPOFOL ;  Surgeon: Legrand Victory LITTIE DOUGLAS, MD;  Location: [facility] ENDOSCOPY;  Service: Gastroenterology;  Laterality: N/A;   EUS N/A 03/11/2015   Procedure: LOWER ENDOSCOPIC ULTRASOUND (EUS);  Surgeon: Toribio SHAUNNA Cedar, MD;  Location: [facility] ENDOSCOPY;  Service: Endoscopy;  Laterality: N/A;   FLEXIBLE SIGMOIDOSCOPY N/A 02/02/2015   Procedure: FLEXIBLE SIGMOIDOSCOPY;  Surgeon: Lamar JONETTA Aho, MD;  Location: [facility] ENDOSCOPY;  Service: Endoscopy;  Laterality: N/A;  ERBE  HEMOSTASIS CLIP PLACEMENT  08/29/2019   Procedure: HEMOSTASIS CLIP PLACEMENT;  Surgeon: Legrand Victory LITTIE DOUGLAS, MD;  Location: [facility] ENDOSCOPY;  Service: Gastroenterology;;   HOT HEMOSTASIS N/A 08/29/2019   Procedure: HOT HEMOSTASIS (ARGON PLASMA COAGULATION/BICAP);  Surgeon: Legrand Victory LITTIE DOUGLAS, MD;  Location: [facility] ENDOSCOPY;  Service: Gastroenterology;  Laterality: N/A;   LEFT HEART CATH AND CORONARY ANGIOGRAPHY N/A 11/20/2018   Procedure: LEFT HEART CATH AND CORONARY  ANGIOGRAPHY;  Surgeon: Verlin Lonni BIRCH, MD;  Location: [facility] INVASIVE CV LAB;  Service: Cardiovascular;  Laterality: N/A;   LOOP RECORDER INSERTION N/A 08/14/2017   Procedure: LOOP RECORDER INSERTION;  Surgeon: Kelsie Agent, MD;  Location: [facility] INVASIVE CV LAB;  Service: Cardiovascular;  Laterality: N/A;   PARTIAL PROCTECTOMY BY TEM N/A 04/08/2015   Procedure: TEM PARTIAL PROCTECTOMY OF RECTAL MASS;  Surgeon: Elspeth Schultze, MD;  Location: [facility] ORS;  Service: General;  Laterality: N/A;   POLYPECTOMY  08/29/2019   Procedure: POLYPECTOMY;  Surgeon: Legrand Victory LITTIE DOUGLAS, MD;  Location: [facility] ENDOSCOPY;  Service: Gastroenterology;;   TEE WITHOUT CARDIOVERSION N/A 06/25/2017   Procedure: TRANSESOPHAGEAL ECHOCARDIOGRAM (TEE);  Surgeon: Raford Riggs, MD;  Location: [facility] ENDOSCOPY;  Service: Cardiovascular;  Laterality: N/A;   TEE WITHOUT CARDIOVERSION N/A 11/26/2018   Procedure: TRANSESOPHAGEAL ECHOCARDIOGRAM (TEE);  Surgeon: Lucas Dorise POUR, MD;  Location: [facility] OR;  Service: Open Heart Surgery;  Laterality: N/A;   TONSILLECTOMY AND ADENOIDECTOMY     as child    Social History:   reports that he quit smoking about 29 years ago. His smoking use included cigarettes. He has never used smokeless tobacco. He reports that he does not drink alcohol  and does not use drugs.  No Known Allergies  Family History  Problem Relation Age of Onset   Cancer Father        all over   Coronary artery disease Brother    Diabetes Brother    Stroke Mother    Diabetes Mother    Cancer Brother    Colon cancer Neg Hx    Esophageal cancer Neg Hx    Rectal cancer Neg Hx    Stomach cancer Neg Hx      Prior to Admission medications   Medication Sig Start Date End Date Taking? Authorizing Provider  ACCU-CHEK GUIDE test strip 1 EACH BY OTHER ROUTE 3 (THREE) TIMES DAILY AS NEEDED FOR OTHER. USE AS INSTRUCTED 04/23/23   Wendee Agent HERO, NP  acetaminophen  (TYLENOL ) 325 MG tablet Take 2 tablets (650 mg total) by mouth every 6  (six) hours as needed for mild pain (pain score 1-3) (or Fever >/= 101). 03/25/24   Emil Share, DO  apixaban  (ELIQUIS ) 2.5 MG TABS tablet Take 1 tablet (2.5 mg total) by mouth 2 (two) times daily. 03/25/24   Emil Share, DO  Continuous Glucose Receiver (FREESTYLE LIBRE 2 READER) DEVI Use to check blood sugars. 03/30/23   Wendee Agent HERO, NP  Continuous Glucose Sensor (FREESTYLE LIBRE 2 SENSOR) MISC Apply every 14 days to check blood sugars. 03/30/23   Wendee Agent HERO, NP  dapagliflozin  propanediol (FARXIGA ) 10 MG TABS tablet Take 10 mg by mouth daily. 03/25/24   [provider]  isosorbide  mononitrate (IMDUR ) 60 MG 24 hr tablet Take 1.5 tablets (90 mg total) by mouth daily. 03/25/24 06/23/24  Emil Share, DO  Lancets [facility] [place] ULTRASOFT) lancets Use as instructed 04/27/21   Kassie Mallick, MD  ondansetron  (ZOFRAN ) 4 MG tablet Take 4 mg by mouth every 6 (six) hours as needed for  nausea or vomiting. 03/28/24   [provider]  rosuvastatin  (CRESTOR ) 20 MG tablet TAKE 1 TABLET BY MOUTH AT BEDTIME. SCHEDULE PHYSICAL EXAM 03/25/24   Emil Share, DO  torsemide  (DEMADEX ) 20 MG tablet TAKE 1 TABLET BY MOUTH TWICE A DAY ON MONDAYS AND THURSDAYS ONLY, TAKE 1 TABLET BY MOUTH DAILY ONLY ALL OTHER DAYS Patient not taking: Reported on 04/04/2024 03/25/24   Emil Share, DO    Physical Exam: Vitals:   04/28/24 1653 04/28/24 1711 04/28/24 1730 04/28/24 1900  BP: 125/67  115/70 134/67  Pulse: 80  79 79  Resp: 16  (!) 0 18  Temp:  98.2 F (36.8 C)    TempSrc:  Rectal    SpO2: 96%  98% 96%  Weight:      Height:        Constitutional: NAD, no pallor or diaphoresis   Eyes: PERTLA, lids and conjunctivae normal ENMT: Mucous membranes are dry. Posterior pharynx clear of any exudate or lesions.   Neck: supple, no masses  Respiratory: Diminished on right, no wheezing. No accessory muscle use.  Cardiovascular: S1 & S2 heard, regular rate and rhythm. No extremity edema. No JVD. Abdomen: No tenderness, soft. Bowel  sounds active.  Musculoskeletal: no clubbing / cyanosis. No joint deformity upper and lower extremities.   Skin: no significant rashes, lesions, ulcers. Warm, dry, well-perfused. Neurologic: CN 2-12 grossly intact. Sensation to light touch intact. Moving all extremities. Lethargic, wakes to voice and answers questions very slowly.  Oriented to person, place, and situation.  Psychiatric: Calm. Cooperative.    Labs and Imaging on Admission: I have personally reviewed following labs and imaging studies  CBC: Recent Labs  Lab 04/28/24 1726  WBC 14.3*  NEUTROABS 12.1*  HGB 12.0*  HCT 38.2*  MCV 82.2  PLT 107*   Basic Metabolic Panel: Recent Labs  Lab 04/28/24 1726  NA 137  K 6.1*  CL 108  CO2 14*  GLUCOSE 174*  BUN 110*  CREATININE 4.96*  CALCIUM  8.1*   GFR: Estimated Creatinine Clearance: 10.9 mL/min (A) (by C-G formula based on SCr of 4.96 mg/dL (H)). Liver Function Tests: Recent Labs  Lab 04/28/24 1726  AST 51*  ALT 22  ALKPHOS 208*  BILITOT 0.8  PROT 6.2*  ALBUMIN  1.8*   No results for input(s): LIPASE, AMYLASE in the last 168 hours. No results for input(s): AMMONIA in the last 168 hours. Coagulation Profile: Recent Labs  Lab 04/28/24 1726  INR 1.7*   Cardiac Enzymes: No results for input(s): CKTOTAL, CKMB, CKMBINDEX, TROPONINI in the last 168 hours. BNP (last 3 results) No results for input(s): PROBNP in the last 8760 hours. HbA1C: No results for input(s): HGBA1C in the last 72 hours. CBG: No results for input(s): GLUCAP in the last 168 hours. Lipid Profile: No results for input(s): CHOL, HDL, LDLCALC, TRIG, CHOLHDL, LDLDIRECT in the last 72 hours. Thyroid  Function Tests: No results for input(s): TSH, T4TOTAL, FREET4, T3FREE, THYROIDAB in the last 72 hours. Anemia Panel: No results for input(s): VITAMINB12, FOLATE, FERRITIN, TIBC, IRON, RETICCTPCT in the last 72 hours. Urine analysis:     Component Value Date/Time   COLORURINE YELLOW 03/25/2024 0056   APPEARANCEUR CLEAR 03/25/2024 0056   LABSPEC 1.010 03/25/2024 0056   PHURINE 5.0 03/25/2024 0056   GLUCOSEU 150 (A) 03/25/2024 0056   HGBUR NEGATIVE 03/25/2024 0056   BILIRUBINUR NEGATIVE 03/25/2024 0056   KETONESUR NEGATIVE 03/25/2024 0056   PROTEINUR 100 (A) 03/25/2024 0056   UROBILINOGEN 0.2 04/11/2015 1658  NITRITE NEGATIVE 03/25/2024 0056   LEUKOCYTESUR NEGATIVE 03/25/2024 0056   Sepsis Labs: @LABRCNTIP (procalcitonin:4,lacticidven:4) )No results found for this or any previous visit (from the past 240 hours).   Radiological Exams on Admission: DG Chest Port 1 View Result Date: 04/28/2024 CLINICAL DATA:  Sepsis, fatigue. EXAM: PORTABLE CHEST 1 VIEW COMPARISON:  April 04, 2024. FINDINGS: Stable cardiomediastinal silhouette. Status post coronary artery bypass graft. Left lung is clear. Mild diffuse opacification of right lung is noted most consistent with layering pleural effusion and probable associated subsegmental atelectasis. Old left clavicular fracture is again noted. IMPRESSION: Mild diffuse opacification of right lung is noted consistent with layering pleural effusion and probable associated subsegmental atelectasis. Electronically Signed   By: Lynwood Landy Raddle M.D.   On: 04/28/2024 17:57    EKG: Independently reviewed. Sinus rhythm.   Assessment/Plan   1. AKI superimposed on CKD IV; hyperkalemia; metabolic acidosis  - Appears hypovolemic on admission  - Check renal US , UA, and urine chemistries, start isotonic bicarbonate infusion, avoid nephrotoxins, renally-dose medications, continue cardiac monitoring, follow serial potassium levels until normalized, repeat serum chemistries in am    2. Pleural effusion  - CXR concerning for right pleural effusion  - He has leukocytosis and cough and was cultured and started on antibiotics in the ED  - Check BNP, procalcitonin, and chest CT, continue empiric antibiotics  and supportive care, consider thoracentesis after CT    3. HTN  - Treat as-needed only for now    4. Type II DM  - Check CBGs and use low-intensity SSI for now   5. Hx of CVA  - Eliquis , Crestor    6. PAF  - Eliquis    7. Chronic HFpEF  - Appears hypovolemic on admission   8. Thrombocytopenia  - Platelets 107,000 on admission, previously normal   - Could be from uremia or infection  - Repeat CBC in am     DVT prophylaxis: sq heparin   Code Status: Full  Level of Care: Level of care: Telemetry Family Communication: None present  Disposition Plan:  Patient is from: SNF  Anticipated d/c is to: SNF  Anticipated d/c date is: 05/02/24  Patient currently: Pending improved renal function, treatment of hyperkalemia and acidosis, chest CT, possible thoracentesis   Consults called: Nephrology  Admission status: Inpatient     Evalene GORMAN Sprinkles, MD [facility]  04/28/2024, 8:28 PM

## 2024-04-29 ENCOUNTER — Inpatient Hospital Stay (HOSPITAL_COMMUNITY)

## 2024-04-29 DIAGNOSIS — N179 Acute kidney failure, unspecified: Secondary | ICD-10-CM | POA: Diagnosis not present

## 2024-04-29 DIAGNOSIS — E875 Hyperkalemia: Secondary | ICD-10-CM | POA: Diagnosis not present

## 2024-04-29 LAB — POTASSIUM
Potassium: 5.6 mmol/L — ABNORMAL HIGH (ref 3.5–5.1)
Potassium: 5.5 mmol/L — ABNORMAL HIGH (ref 3.5–5.1)
Potassium: 5.5 mmol/L — ABNORMAL HIGH (ref 3.5–5.1)
Potassium: 5.6 mmol/L — ABNORMAL HIGH (ref 3.5–5.1)

## 2024-04-29 LAB — URINALYSIS, W/ REFLEX TO CULTURE (INFECTION SUSPECTED)
Bilirubin Urine: NEGATIVE
Glucose, UA: >=500 mg/dL — AB
Hgb urine dipstick: NEGATIVE
Ketones, ur: NEGATIVE mg/dL
Leukocytes,Ua: NEGATIVE
Nitrite: NEGATIVE
Protein, ur: 30 mg/dL — AB
Specific Gravity, Urine: 1.011 (ref 1.005–1.030)
pH: 5 (ref 5.0–8.0)

## 2024-04-29 LAB — COMPREHENSIVE METABOLIC PANEL WITH GFR
ALT: 22 U/L (ref 0–44)
AST: 54 U/L — ABNORMAL HIGH (ref 15–41)
Albumin: 1.7 g/dL — ABNORMAL LOW (ref 3.5–5.0)
Alkaline Phosphatase: 190 U/L — ABNORMAL HIGH (ref 38–126)
Anion gap: 17 — ABNORMAL HIGH (ref 5–15)
BUN: 109 mg/dL — ABNORMAL HIGH (ref 8–23)
CO2: 14 mmol/L — ABNORMAL LOW (ref 22–32)
Calcium: 7.6 mg/dL — ABNORMAL LOW (ref 8.9–10.3)
Chloride: 105 mmol/L (ref 98–111)
Creatinine, Ser: 4.69 mg/dL — ABNORMAL HIGH (ref 0.61–1.24)
GFR, Estimated: 12 mL/min — ABNORMAL LOW (ref >60)
Glucose, Bld: 171 mg/dL — ABNORMAL HIGH (ref 70–99)
Potassium: 5.7 mmol/L — ABNORMAL HIGH (ref 3.5–5.1)
Sodium: 136 mmol/L (ref 135–145)
Total Bilirubin: 0.5 mg/dL (ref 0.0–1.2)
Total Protein: 5.6 g/dL — ABNORMAL LOW (ref 6.5–8.1)

## 2024-04-29 LAB — HEPATIC FUNCTION PANEL
ALT: 21 U/L (ref 0–44)
AST: 54 U/L — ABNORMAL HIGH (ref 15–41)
Albumin: 1.7 g/dL — ABNORMAL LOW (ref 3.5–5.0)
Alkaline Phosphatase: 187 U/L — ABNORMAL HIGH (ref 38–126)
Bilirubin, Direct: 0.1 mg/dL (ref 0.0–0.2)
Indirect Bilirubin: 0.6 mg/dL (ref 0.3–0.9)
Total Bilirubin: 0.7 mg/dL (ref 0.0–1.2)
Total Protein: 5.6 g/dL — ABNORMAL LOW (ref 6.5–8.1)

## 2024-04-29 LAB — GLUCOSE, CAPILLARY
Glucose-Capillary: 165 mg/dL — ABNORMAL HIGH (ref 70–99)
Glucose-Capillary: 158 mg/dL — ABNORMAL HIGH (ref 70–99)
Glucose-Capillary: 187 mg/dL — ABNORMAL HIGH (ref 70–99)
Glucose-Capillary: 156 mg/dL — ABNORMAL HIGH (ref 70–99)

## 2024-04-29 LAB — CBC
HCT: 35.6 % — ABNORMAL LOW (ref 39.0–52.0)
Hemoglobin: 11 g/dL — ABNORMAL LOW (ref 13.0–17.0)
MCH: 25.9 pg — ABNORMAL LOW (ref 26.0–34.0)
MCHC: 30.9 g/dL (ref 30.0–36.0)
MCV: 83.8 fL (ref 80.0–100.0)
Platelets: 83 K/uL — ABNORMAL LOW (ref 150–400)
RBC: 4.25 MIL/uL (ref 4.22–5.81)
RDW: 15.5 % (ref 11.5–15.5)
WBC: 12.8 K/uL — ABNORMAL HIGH (ref 4.0–10.5)
nRBC: 0.6 % — ABNORMAL HIGH (ref 0.0–0.2)

## 2024-04-29 LAB — CREATININE, URINE, RANDOM: Creatinine, Urine: 127 mg/dL

## 2024-04-29 LAB — SODIUM, URINE, RANDOM: Sodium, Ur: <10 mmol/L

## 2024-04-29 LAB — MRSA NEXT GEN BY PCR, NASAL: MRSA by PCR Next Gen: NOT DETECTED

## 2024-04-29 MED ORDER — APIXABAN 2.5 MG PO TABS
2.5000 mg | ORAL_TABLET | Freq: Two times a day (BID) | ORAL | Status: DC
  Administered 2024-04-29 – 2024-05-05 (×12): 2.5 mg via ORAL
  Filled 2024-04-29 (×11): qty 1

## 2024-04-29 MED ORDER — SODIUM ZIRCONIUM CYCLOSILICATE 10 G PO PACK
10.0000 g | PACK | Freq: Once | ORAL | Status: AC
  Administered 2024-04-29: 10 g via ORAL
  Filled 2024-04-29: qty 1

## 2024-04-29 MED ORDER — SODIUM ZIRCONIUM CYCLOSILICATE 10 G PO PACK
10.0000 g | PACK | Freq: Once | ORAL | Status: DC
  Filled 2024-04-29: qty 1

## 2024-04-29 MED ORDER — AZITHROMYCIN 500 MG PO TABS
500.0000 mg | ORAL_TABLET | Freq: Every day | ORAL | Status: DC
  Administered 2024-04-29: 500 mg via ORAL
  Filled 2024-04-29: qty 2

## 2024-04-29 MED ORDER — STERILE WATER FOR INJECTION IV SOLN
INTRAVENOUS | Status: AC
  Filled 2024-04-29 (×2): qty 150

## 2024-04-29 NOTE — Hospital Course (Signed)
 83 y.o. male with medical history significant for HTN, DM, HLD, CAD x/p CABG, chronic HFpEF, PAF on Eliquis , and CKD IV who presents with lethargy and abnormal chest X-ray.    Patient was recently admitted with AKI, had BP meds held and was given IVF with improvement, and had ARB, torsemide , and Farxiga  held on discharge.    He has since developed worsening lethargy at the SNF where a CXR was obtained and said to reveal pneumonia. EMS was called and he was given 250 mL IVF prior to arrival in the ED.    The patient denies loss of appetite, N/V/D, chest pain, or abdominal pain. He reports SOB and cough for weeks.     ED Course: Upon arrival to the ED, patient is found to be afebrile and saturating well on room air with normal HR and stable BP. Labs are most notable for SCr 4.96, potassium 6.1, serum bicarbonate 14, albumin  1.8, WBC 14.3, and platelets 107k.    Nephrology (Dr. Macel) was consulted by the ED physician, blood cultures were collected, and the patient was given 500 mL NS, insulin  with dextrose , Lokelma , vancomycin , and cefepime .

## 2024-04-29 NOTE — Progress Notes (Signed)
 Progress Note   Patient: Jake Gross FMW:[id number] DOB: November 21, 1940 DOA: 04/28/2024     1 DOS: the patient was seen and examined on 04/29/2024   Brief hospital course: 83 y.o. male with medical history significant for HTN, DM, HLD, CAD x/p CABG, chronic HFpEF, PAF on Eliquis , and CKD IV who presents with lethargy and abnormal chest X-ray.    Patient was recently admitted with AKI, had BP meds held and was given IVF with improvement, and had ARB, torsemide , and Farxiga  held on discharge.    He has since developed worsening lethargy at the SNF where a CXR was obtained and said to reveal pneumonia. EMS was called and he was given 250 mL IVF prior to arrival in the ED.    The patient denies loss of appetite, N/V/D, chest pain, or abdominal pain. He reports SOB and cough for weeks.     ED Course: Upon arrival to the ED, patient is found to be afebrile and saturating well on room air with normal HR and stable BP. Labs are most notable for SCr 4.96, potassium 6.1, serum bicarbonate 14, albumin  1.8, WBC 14.3, and platelets 107k.    Nephrology (Dr. Macel) was consulted by the ED physician, blood cultures were collected, and the patient was given 500 mL NS, insulin  with dextrose , Lokelma , vancomycin , and cefepime .   Assessment and Plan: 1. AKI superimposed on CKD IV; hyperkalemia; metabolic acidosis  - Appeared hypovolemic on admission  - No hydronephrosis on renal US  - continue bicarb gtt as tolerated - Mild decrease in Cr this AM -Have consulted Nephrology    2. Large R Pleural effusion  - CXR concerning for right pleural effusion  - He has leukocytosis and cough and was cultured and started on antibiotics in the ED  - CT chest reviewed, large R pleural effusion with RLL compressive atelectasis. Pt is complaining of increased sob this afternoon -Requested US  paracentesis. F/u on fluid analysis and culture results   3. HTN  - BP stable and controlled at this time - Will only treat as  needed for now - Avoid hypotension   4. Type II DM  -glycemic trends stable - Continue low-intensity SSI for now  -recent A1c 4.9   5. Hx of CVA  - cont Eliquis , Crestor     6. PAF  - cont Eliquis     7. Chronic HFpEF  - Appeared hypovolemic on admission  -cont on bicarb gtt for now   8. Thrombocytopenia  - Platelets 107,000 on admission, previously normal   - Could be from uremia or infection  - No signs of acute blood loss at this time -Recheck bmet in AM  9. Comminuted L clavicular fracture -incidentally noted on CT chest. Pt was made aware of finding -cont with analgesia as needed  10. Hyperkalemia -Given lokelma  overnight -K remains elevated this AM at 5.7 -Ordered 10mg  lokelma      Subjective: Complained of feeling more sob this afternoon  Physical Exam: Vitals:   04/29/24 0204 04/29/24 0442 04/29/24 0558 04/29/24 1000  BP: 101/64  104/61 112/60  Pulse: 83  75 73  Resp: 20  20 18   Temp: 97.9 F (36.6 C)  (!) 97.5 F (36.4 C) 97.9 F (36.6 C)  TempSrc: Oral     SpO2: 100%  99% 99%  Weight:  78.8 kg    Height:       General exam: Awake, laying in bed, in nad Respiratory system: Normal respiratory effort, no wheezing Cardiovascular system: regular rate,  s1, s2 Gastrointestinal system: Soft, nondistended, positive BS Central nervous system: CN2-12 grossly intact, strength intact Extremities: Perfused, no clubbing Skin: Normal skin turgor, no notable skin lesions seen Psychiatry: Mood normal // no visual hallucinations   Data Reviewed:  Labs reviewed: Na 136, K 5.7, Cr 4.69, BUN 109, WBC 12.8, Hgb 11.0, Plts 83  Family Communication: Pt in room, family at bedside  Disposition: Status is: Inpatient Remains inpatient appropriate because: severity of illness  Planned Discharge Destination: Unclear at this time    Author: Garnette Pelt, MD 04/29/2024 5:50 PM  For on call review [email or web address].

## 2024-04-29 NOTE — Consult Note (Signed)
 [facility] Renal Consultation Note  Requesting MD: Garnette Pelt, MD Indication for Consultation:  AKI   Chief complaint: I don't know  HPI:  Jake Gross is a 83 y.o. male with a history of diabetes, hypertension, coronary artery disease status post CABG, diastolic heart failure, paroxysmal atrial fibrillation and CKD who presented to the hospital on 7/14.  He states he came with pneumonia and then states that he doesn't know why he came.  Lives at SNF.  Wasn't short of breath.  States eating and drinking ok.  He was found to have AKI.  Baseline creatinine is mid to high twos as of earlier this June.  He has had multiple prior AKI events.  Recently here 6/20-6/23/25 with the same.  Followed in clinic by Dr. Macel.  He was admitted with creatinine of 4.96 and this has improved slightly to 4.69.  He has had a bicarb infusion.  He has had concurrent hyperkalemia which is improving with medical management.  Nephrology is consulted for assistance with management of AKI.  He had 200 mL UOP over 7/14.  He states that he hasn't discussed dialysis with his outpatient nephrologist and he doesn't know what he would want to do if kidneys worsened.  His daughter would help with decisions if needed.      Creatinine, Ser  Date/Time Value Ref Range Status  04/29/2024 08:47 AM 4.69 (H) 0.61 - 1.24 mg/dL Final  92/85/7974 94:73 PM 4.96 (H) 0.61 - 1.24 mg/dL Final  93/76/7974 96:77 AM 2.95 (H) 0.61 - 1.24 mg/dL Final  93/77/7974 97:44 AM 3.17 (H) 0.61 - 1.24 mg/dL Final  93/78/7974 96:89 AM 3.60 (H) 0.61 - 1.24 mg/dL Final  93/79/7974 98:41 PM 3.51 (H) 0.61 - 1.24 mg/dL Final  93/90/7974 95:89 PM 2.73 (H) 0.61 - 1.24 mg/dL Final  93/91/7974 96:83 AM 2.86 (H) 0.61 - 1.24 mg/dL Final  93/92/7974 91:70 AM 3.05 (H) 0.61 - 1.24 mg/dL Final  93/94/7974 95:72 PM 2.66 (H) 0.61 - 1.24 mg/dL Final  93/95/7974 94:53 AM 2.39 (H) 0.61 - 1.24 mg/dL Final  93/96/7974 94:48 AM 2.57 (H) 0.61 - 1.24  mg/dL Final  93/97/7974 92:92 AM 2.53 (H) 0.61 - 1.24 mg/dL Final  93/98/7974 93:46 AM 2.56 (H) 0.61 - 1.24 mg/dL Final  94/68/7974 88:77 PM 2.74 (H) 0.61 - 1.24 mg/dL Final  94/68/7974 92:88 PM 2.77 (H) 0.61 - 1.24 mg/dL Final  92/80/7975 89:45 AM 1.69 (H) 0.76 - 1.27 mg/dL Final  94/79/7975 97:57 PM 1.76 (H) 0.40 - 1.50 mg/dL Final  95/77/7975 95:67 AM 2.26 (H) 0.61 - 1.24 mg/dL Final  95/78/7975 96:58 AM 2.35 (H) 0.61 - 1.24 mg/dL Final  95/79/7975 96:69 AM 3.11 (H) 0.61 - 1.24 mg/dL Final  95/80/7975 98:67 AM 3.40 (H) 0.61 - 1.24 mg/dL Final  95/81/7975 94:79 PM 3.50 (H) 0.61 - 1.24 mg/dL Final  95/87/7975 87:55 AM 2.71 (H) 0.61 - 1.24 mg/dL Final  95/88/7975 95:82 AM 3.28 (H) 0.61 - 1.24 mg/dL Final  95/89/7975 92:96 PM 3.39 (H) 0.61 - 1.24 mg/dL Final  98/74/7975 95:66 PM 2.51 (H) 0.40 - 1.50 mg/dL Final  87/81/7976 97:57 AM 2.26 (H) 0.61 - 1.24 mg/dL Final  87/83/7976 98:75 AM 2.70 (H) 0.61 - 1.24 mg/dL Final  87/84/7976 97:66 AM 2.92 (H) 0.61 - 1.24 mg/dL Final  87/85/7976 96:54 AM 3.66 (H) 0.61 - 1.24 mg/dL Final  87/86/7976 89:87 PM 4.20 (H) 0.61 - 1.24 mg/dL Final  87/86/7976 89:93 PM 3.99 (H) 0.61 - 1.24 mg/dL Final  04/27/2022 02:37 PM 2.70 (H) 0.40 - 1.50 mg/dL Final  88/71/7977 98:98 PM 2.16 (H) 0.61 - 1.24 mg/dL Final  89/94/7977 96:47 PM 2.32 (H) 0.76 - 1.27 mg/dL Final  89/96/7977 89:62 AM 1.90 (H) 0.76 - 1.27 mg/dL Final  98/88/7978 89:51 AM 2.24 (H) 0.76 - 1.27 mg/dL Final  98/93/7978 97:61 PM 2.18 (H) 0.76 - 1.27 mg/dL Final  87/84/7979 87:92 PM 1.73 (H) 0.76 - 1.27 mg/dL Final  87/88/7979 88:49 AM 1.94 (H) 0.76 - 1.27 mg/dL Final  87/95/7979 87:49 PM 2.14 (H) 0.76 - 1.27 mg/dL Final  88/76/7979 87:77 PM 2.93 (H) 0.40 - 1.50 mg/dL Final  88/85/7979 94:53 AM 1.97 (H) 0.61 - 1.24 mg/dL Final  88/86/7979 94:76 AM 1.86 (H) 0.61 - 1.24 mg/dL Final  88/87/7979 93:59 AM 2.12 (H) 0.61 - 1.24 mg/dL Final  88/88/7979 88:57 AM 1.91 (H) 0.61 - 1.24 mg/dL Final  91/75/7979  88:53 AM 1.84 (H) 0.76 - 1.27 mg/dL Final  91/82/7979 96:84 PM 1.85 (H) 0.76 - 1.27 mg/dL Final  91/96/7979 98:99 PM 1.87 (H) 0.76 - 1.27 mg/dL Final  92/92/7979 88:69 AM 1.62 (H) 0.76 - 1.27 mg/dL Final  97/82/7979 96:78 AM 1.50 (H) 0.61 - 1.24 mg/dL Final     PMHx:   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    Arthritis    AVM (arteriovenous malformation) of small bowel, 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   CAD (coronary artery disease) 09/29/2019   Cervical spondylosis    Chronic anticoagulation    Chronic diastolic CHF (congestive heart failure) (HCC) 12/17/2018   CKD (chronic kidney disease) 12/17/2018   Diabetes mellitus    History of GI bleed 09/29/2019   Hyperlipidemia    Hypertension    Iron deficiency anemia due to chronic blood loss    Paroxysmal atrial fibrillation (HCC) 12/17/2018   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 NO ISCHEMIA   COLONOSCOPY W/ POLYPECTOMY     5 polyps   COLONOSCOPY WITH PROPOFOL  N/A 08/29/2019   Procedure: COLONOSCOPY WITH PROPOFOL ;  Surgeon: Legrand Victory LITTIE DOUGLAS, MD;  Location: [facility] ENDOSCOPY;  Service: Gastroenterology;  Laterality: N/A;   CORONARY ARTERY BYPASS GRAFT N/A 11/26/2018   Procedure: CORONARY ARTERY BYPASS GRAFTING (CABG), ON PUMP, TIMES FOUR, USING LEFT INTERNAL MAMMARY ARTERY AND ENDOSCOPICALLY HARVESTED LEFT SAPHENOUS VEIN;  Surgeon: Lucas Dorise POUR, MD;  Location: [facility] OR;  Service: Open Heart Surgery;  Laterality: N/A;   ESOPHAGOGASTRODUODENOSCOPY (EGD) WITH PROPOFOL  N/A 08/29/2019   Procedure: ESOPHAGOGASTRODUODENOSCOPY (EGD) WITH PROPOFOL ;  Surgeon: Legrand Victory LITTIE DOUGLAS, MD;  Location: [facility]  ENDOSCOPY;  Service: Gastroenterology;  Laterality: N/A;   EUS N/A 03/11/2015   Procedure: LOWER ENDOSCOPIC ULTRASOUND (EUS);  Surgeon: Toribio SHAUNNA Cedar, MD;  Location: [facility];  Service: Endoscopy;  Laterality: N/A;   FLEXIBLE SIGMOIDOSCOPY N/A 02/02/2015   Procedure: FLEXIBLE SIGMOIDOSCOPY;  Surgeon: Lamar JONETTA Aho, MD;  Location: [facility] ENDOSCOPY;  Service: Endoscopy;  Laterality: N/A;  ERBE   HEMOSTASIS CLIP PLACEMENT  08/29/2019   Procedure: HEMOSTASIS CLIP PLACEMENT;  Surgeon: Legrand Victory LITTIE DOUGLAS, MD;  Location: [facility] ENDOSCOPY;  Service: Gastroenterology;;   HOT HEMOSTASIS  N/A 08/29/2019   Procedure: HOT HEMOSTASIS (ARGON PLASMA COAGULATION/BICAP);  Surgeon: Legrand Victory LITTIE DOUGLAS, MD;  Location[facility] ENDOSCOPY;  Service: Gastroenterology;  Laterality: N/A;   LEFT HEART CATH AND CORONARY ANGIOGRAPHY N/A 11/20/2018   Procedure: LEFT HEART CATH AND CORONARY ANGIOGRAPHY;  Surgeon: Verlin Lonni BIRCH, MD;  Location: [facility] INVASIVE CV LAB;  Service: Cardiovascular;  Laterality: N/A;   LOOP RECORDER INSERTION N/A 08/14/2017   Procedure: LOOP RECORDER INSERTION;  Surgeon: Kelsie Agent, MD;  Location: [facility] INVASIVE CV LAB;  Service: Cardiovascular;  Laterality: N/A;   PARTIAL PROCTECTOMY BY TEM N/A 04/08/2015   Procedure: TEM PARTIAL PROCTECTOMY OF RECTAL MASS;  Surgeon: Elspeth Schultze, MD;  Location: [facility] ORS;  Service: General;  Laterality: N/A;   POLYPECTOMY  08/29/2019   Procedure: POLYPECTOMY;  Surgeon: Legrand Victory LITTIE DOUGLAS, MD;  Location: [facility] ENDOSCOPY;  Service: Gastroenterology;;   TEE WITHOUT CARDIOVERSION N/A 06/25/2017   Procedure: TRANSESOPHAGEAL ECHOCARDIOGRAM (TEE);  Surgeon: Raford Riggs, MD;  Location: [facility] ENDOSCOPY;  Service: Cardiovascular;  Laterality: N/A;   TEE WITHOUT CARDIOVERSION N/A 11/26/2018   Procedure: TRANSESOPHAGEAL ECHOCARDIOGRAM (TEE);  Surgeon: Lucas Dorise POUR, MD;  Location: [facility] OR;  Service: Open Heart Surgery;  Laterality: N/A;   TONSILLECTOMY AND ADENOIDECTOMY     as child     Family Hx:  Family History  Problem Relation Age of Onset   Cancer Father        all over   Coronary artery disease Brother    Diabetes Brother    Stroke Mother    Diabetes Mother    Cancer Brother    Colon cancer Neg Hx    Esophageal cancer Neg Hx    Rectal cancer Neg Hx    Stomach cancer Neg Hx   No family history of ESRD  Social History:  reports that he quit smoking about 29 years ago. His smoking use included cigarettes. He has never used smokeless tobacco. He reports that he does not drink alcohol  and does not use drugs.  Allergies: No Known Allergies  Medications: Prior to Admission medications   Medication Sig Start Date End Date Taking? Authorizing Provider  acetaminophen  (TYLENOL ) 325 MG tablet Take 2 tablets (650 mg total) by mouth every 6 (six) hours as needed for mild pain (pain score 1-3) (or Fever >/= 101). 03/25/24  Yes Emil Share, DO  apixaban  (ELIQUIS ) 2.5 MG TABS tablet Take 1 tablet (2.5 mg total) by mouth 2 (two) times daily. 03/25/24  Yes Emil Share, DO  dapagliflozin  propanediol (FARXIGA ) 10 MG TABS tablet Take 10 mg by mouth daily. 03/25/24  Yes [provider]  doxycycline  (VIBRAMYCIN ) 100 MG capsule Take 100 mg by mouth 2 (two) times daily. 04/27/24 05/03/24 Yes [provider]  isosorbide  mononitrate (IMDUR ) 60 MG 24 hr tablet Take 1.5 tablets (90 mg total) by mouth daily. 03/25/24 06/23/24 Yes Emil Share, DO  Nutritional Supplements (BOOST GLUCOSE CONTROL) LIQD Take 1 Bottle by mouth 2 (two) times daily at 10 am and 4 pm.   Yes [provider]  ondansetron  (ZOFRAN ) 4 MG tablet Take 4 mg by mouth every 6 (six) hours as needed for nausea or vomiting. 03/28/24  Yes [provider]  rosuvastatin  (CRESTOR ) 20 MG tablet TAKE 1 TABLET BY MOUTH AT BEDTIME. SCHEDULE PHYSICAL EXAM Patient taking differently: Take 20 mg by mouth at bedtime. 03/25/24  Yes Emil Share, DO  torsemide  (DEMADEX ) 20 MG tablet TAKE 1 TABLET BY MOUTH TWICE A  DAY ON MONDAYS AND THURSDAYS ONLY,  TAKE 1 TABLET BY MOUTH DAILY ONLY ALL OTHER DAYS Patient taking differently: Take 20 mg by mouth See admin instructions. TAKE 1 TABLET BY MOUTH TWICE A DAY ON MONDAYS AND THURSDAYS ONLY, TAKE 1 TABLET BY MOUTH DAILY ONLY ALL OTHER DAYS 03/25/24  Yes Emil Share, DO   I have reviewed the patient's current and reported prior to admission medications.  Labs:     Latest Ref Rng & Units 04/29/2024    4:29 PM 04/29/2024   12:51 PM 04/29/2024    8:47 AM  BMP  Glucose 70 - 99 mg/dL   828   BUN 8 - 23 mg/dL   890   Creatinine 9.38 - 1.24 mg/dL   5.30   Sodium 864 - 854 mmol/L   136   Potassium 3.5 - 5.1 mmol/L 5.5  5.5  5.7   Chloride 98 - 111 mmol/L   105   CO2 22 - 32 mmol/L   14   Calcium  8.9 - 10.3 mg/dL   7.6     Urinalysis    Component Value Date/Time   COLORURINE YELLOW 04/29/2024 0231   APPEARANCEUR HAZY (A) 04/29/2024 0231   LABSPEC 1.011 04/29/2024 0231   PHURINE 5.0 04/29/2024 0231   GLUCOSEU >=500 (A) 04/29/2024 0231   HGBUR NEGATIVE 04/29/2024 0231   BILIRUBINUR NEGATIVE 04/29/2024 0231   KETONESUR NEGATIVE 04/29/2024 0231   PROTEINUR 30 (A) 04/29/2024 0231   UROBILINOGEN 0.2 04/11/2015 1658   NITRITE NEGATIVE 04/29/2024 0231   LEUKOCYTESUR NEGATIVE 04/29/2024 0231     ROS:  Pertinent items noted in HPI and remainder of comprehensive ROS otherwise negative.  Physical Exam: Vitals:   04/29/24 0558 04/29/24 1000  BP: 104/61 112/60  Pulse: 75 73  Resp: 20 18  Temp: (!) 97.5 F (36.4 C) 97.9 F (36.6 C)  SpO2: 99% 99%     General:  elderly male in bed in NAD  HEENT: NCAT  Eyes: EOMI sclera anicteric Neck: supple trachea midline  Heart: S1S2 no rub Lungs: clear to auscultation bilaterally; normal work of breathing on 2 liters Abdomen: soft/nd/nt Extremities: no edema appreciated; no cyanosis or clubbing Skin: no rash on extremities exposed Neuro: sleepy but awakens to voice; somewhat poor historian  GU external catheter  with 150 mL in canister  Assessment/Plan:  # AKI  - Pre-renal and ischemic insults - hypotension and diuretics to optimize CHF. Renal US  with no hydro; note increased echogenicity - Hold farxiga  and torsemide   - continue bicarb gtt   - reordered strict ins/outs  - check post-void residual bladder scan and in/out cath if needed  # CKD stage IV  - Followed by Macel - Baseline Cr mid to high 2's  # Hyperkalemia - Continue bicarb gtt - improving with medical management - agree with lokelma  - reordered as not actually given   # Chronic diastolic CHF  - caution with fluids  - holding torsemide  and farxiga  as above  # Metabolic acidosis - on bicarb gtt  # HTN  - Controlled   # Anemia of CKD - mild - no indication for ESA  Thank you for the consult.  Please do not hesitate to contact me with any questions regarding our patient   Katheryn JAYSON Saba 04/29/2024, 6:24 PM

## 2024-04-29 NOTE — Plan of Care (Signed)
   Problem: Education: Goal: Ability to describe self-care measures that may prevent or decrease complications (Diabetes Survival Skills Education) will improve Outcome: Progressing Goal: Individualized Educational Video(s) Outcome: Progressing   Problem: Coping: Goal: Ability to adjust to condition or change in health will improve Outcome: Progressing   Problem: Fluid Volume: Goal: Ability to maintain a balanced intake and output will improve Outcome: Progressing   Problem: Health Behavior/Discharge Planning: Goal: Ability to identify and utilize available resources and services will improve Outcome: Progressing Goal: Ability to manage health-related needs will improve Outcome: Progressing   Problem: Metabolic: Goal: Ability to maintain appropriate glucose levels will improve Outcome: Progressing   Problem: Nutritional: Goal: Maintenance of adequate nutrition will improve Outcome: Progressing Goal: Progress toward achieving an optimal weight will improve Outcome: Progressing   Problem: Skin Integrity: Goal: Risk for impaired skin integrity will decrease Outcome: Progressing   Problem: Tissue Perfusion: Goal: Adequacy of tissue perfusion will improve Outcome: Progressing   Problem: Activity: Goal: Ability to tolerate increased activity will improve Outcome: Progressing   Problem: Clinical Measurements: Goal: Ability to maintain a body temperature in the normal range will improve Outcome: Progressing   Problem: Respiratory: Goal: Ability to maintain adequate ventilation will improve Outcome: Progressing Goal: Ability to maintain a clear airway will improve Outcome: Progressing   Problem: Education: Goal: Knowledge of General Education information will improve Description: Including pain rating scale, medication(s)/side effects and non-pharmacologic comfort measures Outcome: Progressing   Problem: Health Behavior/Discharge Planning: Goal: Ability to manage  health-related needs will improve Outcome: Progressing   Problem: Clinical Measurements: Goal: Ability to maintain clinical measurements within normal limits will improve Outcome: Progressing Goal: Will remain free from infection Outcome: Progressing Goal: Diagnostic test results will improve Outcome: Progressing Goal: Respiratory complications will improve Outcome: Progressing Goal: Cardiovascular complication will be avoided Outcome: Progressing   Problem: Activity: Goal: Risk for activity intolerance will decrease Outcome: Progressing   Problem: Nutrition: Goal: Adequate nutrition will be maintained Outcome: Progressing   Problem: Coping: Goal: Level of anxiety will decrease Outcome: Progressing   Problem: Elimination: Goal: Will not experience complications related to bowel motility Outcome: Progressing Goal: Will not experience complications related to urinary retention Outcome: Progressing   Problem: Pain Managment: Goal: General experience of comfort will improve and/or be controlled Outcome: Progressing   Problem: Safety: Goal: Ability to remain free from injury will improve Outcome: Progressing   Problem: Skin Integrity: Goal: Risk for impaired skin integrity will decrease Outcome: Progressing

## 2024-04-29 NOTE — TOC Initial Note (Signed)
 Transition of Care [facility]) - Initial/Assessment Note    Patient Details  Name: Jake Gross MRN: [id number] Date of Birth: 1941-10-02  [facility] CM/SW Contact:    Bascom Service, RN Phone Number: 04/29/2024, 12:51 PM  Clinical Narrative:   From [name] SQUIBB rep [name] aware;dtr [name] for return once stable.                Expected Discharge Plan: Skilled Nursing Facility Barriers to Discharge: Continued Medical Work up   Patient Goals and CMS Choice     Choice offered to / list presented to : Adult Children [place] ownership interest in [facility].provided to:: Adult Children    Expected Discharge Plan and Services   Discharge Planning Services: CM Consult Post Acute Care Choice: Skilled Nursing Facility Living arrangements for the past 2 months: Skilled Nursing Facility                                      Prior Living Arrangements/Services Living arrangements for the past 2 months: Skilled Nursing Facility Lives with:: Facility Resident   Do you feel safe going back to the place where you live?: Yes               Activities of Daily Living   ADL Screening (condition at time of admission) Independently performs ADLs?: No Does the patient have a NEW difficulty with bathing/dressing/toileting/self-feeding that is expected to last >3 days?: No Does the patient have a NEW difficulty with getting in/out of bed, walking, or climbing stairs that is expected to last >3 days?: No Does the patient have a NEW difficulty with communication that is expected to last >3 days?: No Is the patient deaf or have difficulty hearing?: Yes Does the patient have difficulty seeing, even when wearing glasses/contacts?: Yes Does the patient have difficulty concentrating, remembering, or making decisions?: No  Permission Sought/Granted   Permission granted to share information with : Yes, Verbal Permission Granted              Emotional  Assessment              Admission diagnosis:  Hyperkalemia [E87.5] AKI (acute kidney injury) (HCC) [N17.9] Acute renal failure 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 (HCC) [N17.9, N18.4] Pneumonia of right lung due to infectious organism, unspecified part of lung [J18.9] Patient Active Problem List   Diagnosis Date Noted   Pleural effusion on right 04/28/2024   Thrombocytopenia (HCC) 04/28/2024   Weakness 04/04/2024   Chronic a-fib (HCC) 03/22/2024   Diastolic CHF, chronic (HCC) 03/22/2024   Generalized weakness 03/20/2024   Hypoglycemia 03/16/2024   Right hip pain 10/12/2023   Enlarged and hypertrophic nails 07/13/2023   Hospital discharge follow-up 03/05/2023   Acute-on-chronic kidney injury (HCC) 02/01/2023   AKI (acute kidney injury) (HCC) 01/25/2023   Acute renal failure 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 (HCC) 09/28/2022   Elevated troponin 09/28/2022   Metabolic acidosis 09/28/2022   Prolonged QT interval 09/28/2022   Medicare annual wellness visit, subsequent 10/29/2021   Abnormal SPEP 09/12/2021   Dyspnea 06/27/2021   Abscess 06/27/2021   History of anemia 06/27/2021   Nocturia 06/27/2021   Benign hypertensive kidney disease with chronic kidney disease 12/22/2019   Proteinuria 12/22/2019   Stage 3b chronic kidney disease (HCC) 12/22/2019   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  Hypokalemia 12/22/2019   History of GI bleed 09/29/2019   CAD (coronary artery disease) 09/29/2019   AVM (arteriovenous malformation) of small bowel, 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   Iron deficiency anemia due to chronic blood loss    Chronic anticoagulation    History of CVA (cerebrovascular accident) 08/25/2019   (HFpEF) heart failure with preserved ejection fraction (HCC) 12/17/2018   Paroxysmal atrial fibrillation (HCC) 12/17/2018   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 [place] [address] Phone: [phone] Fax: [phone]     Social Drivers of Health (SDOH) Social History: SDOH Screenings   Food Insecurity: No Food Insecurity (04/05/2024)  Housing: Low Risk  (04/05/2024)  Transportation Needs: No Transportation Needs (04/05/2024)  Utilities: Not At Risk (04/28/2024)  Alcohol  Screen: Low Risk  (12/11/2023)  Depression (PHQ2-9): Low Risk  (02/21/2024)  Financial Resource Strain: Low Risk  (12/11/2023)  Physical Activity: Insufficiently Active (12/11/2023)  Social Connections: Moderately Isolated (04/05/2024)  Stress: No Stress Concern Present (12/11/2023)  Tobacco Use: Medium Risk (04/28/2024)  Health Literacy: Adequate Health Literacy (12/11/2023)   SDOH Interventions:     Readmission Risk Interventions    02/05/2023   11:08 AM  Readmission Risk Prevention Plan  Transportation Screening Complete  Medication Review (RN Care Manager) Complete  PCP or Specialist appointment within 3-5 days of discharge Complete  HRI or Home Care Consult Complete  SW Recovery Care/Counseling Consult Complete  Palliative Care Screening Not Applicable  Skilled Nursing Facility Complete

## 2024-04-30 ENCOUNTER — Inpatient Hospital Stay (HOSPITAL_COMMUNITY)

## 2024-04-30 DIAGNOSIS — N184 Chronic kidney disease, stage 4 (severe): Secondary | ICD-10-CM | POA: Diagnosis not present

## 2024-04-30 DIAGNOSIS — N179 Acute kidney failure, unspecified: Secondary | ICD-10-CM | POA: Diagnosis not present

## 2024-04-30 LAB — POTASSIUM
Potassium: 5.2 mmol/L — ABNORMAL HIGH (ref 3.5–5.1)
Potassium: 5.5 mmol/L — ABNORMAL HIGH (ref 3.5–5.1)

## 2024-04-30 LAB — CBC
HCT: 33.4 % — ABNORMAL LOW (ref 39.0–52.0)
Hemoglobin: 10.6 g/dL — ABNORMAL LOW (ref 13.0–17.0)
MCH: 25.9 pg — ABNORMAL LOW (ref 26.0–34.0)
MCHC: 31.7 g/dL (ref 30.0–36.0)
MCV: 81.7 fL (ref 80.0–100.0)
Platelets: 94 K/uL — ABNORMAL LOW (ref 150–400)
RBC: 4.09 MIL/uL — ABNORMAL LOW (ref 4.22–5.81)
RDW: 15.3 % (ref 11.5–15.5)
WBC: 12.7 K/uL — ABNORMAL HIGH (ref 4.0–10.5)
nRBC: 0.4 % — ABNORMAL HIGH (ref 0.0–0.2)

## 2024-04-30 LAB — BASIC METABOLIC PANEL WITH GFR
Anion gap: 16 — ABNORMAL HIGH (ref 5–15)
BUN: 100 mg/dL — ABNORMAL HIGH (ref 8–23)
CO2: 19 mmol/L — ABNORMAL LOW (ref 22–32)
Calcium: 7.4 mg/dL — ABNORMAL LOW (ref 8.9–10.3)
Chloride: 99 mmol/L (ref 98–111)
Creatinine, Ser: 4.98 mg/dL — ABNORMAL HIGH (ref 0.61–1.24)
GFR, Estimated: 11 mL/min — ABNORMAL LOW (ref >60)
Glucose, Bld: 116 mg/dL — ABNORMAL HIGH (ref 70–99)
Potassium: 4.8 mmol/L (ref 3.5–5.1)
Sodium: 134 mmol/L — ABNORMAL LOW (ref 135–145)

## 2024-04-30 LAB — UREA NITROGEN, URINE: Urea Nitrogen, Ur: 534 mg/dL

## 2024-04-30 LAB — GLUCOSE, CAPILLARY
Glucose-Capillary: 117 mg/dL — ABNORMAL HIGH (ref 70–99)
Glucose-Capillary: 116 mg/dL — ABNORMAL HIGH (ref 70–99)
Glucose-Capillary: 163 mg/dL — ABNORMAL HIGH (ref 70–99)
Glucose-Capillary: 160 mg/dL — ABNORMAL HIGH (ref 70–99)

## 2024-04-30 MED ORDER — SODIUM CHLORIDE 0.9 % IV SOLN: INTRAVENOUS | Status: DC

## 2024-04-30 MED ORDER — SODIUM ZIRCONIUM CYCLOSILICATE 10 G PO PACK
10.0000 g | PACK | Freq: Once | ORAL | Status: AC
  Administered 2024-04-30: 10 g via ORAL
  Filled 2024-04-30: qty 1

## 2024-04-30 MED ORDER — SODIUM BICARBONATE 8.4 % IV SOLN
INTRAVENOUS | Status: DC
  Filled 2024-04-30: qty 1000

## 2024-04-30 MED ORDER — CHLORHEXIDINE GLUCONATE CLOTH 2 % EX PADS
6.0000 | MEDICATED_PAD | Freq: Every day | CUTANEOUS | Status: DC
  Administered 2024-05-01 – 2024-05-03 (×3): 6 via TOPICAL

## 2024-04-30 MED ORDER — GERHARDT'S BUTT CREAM
TOPICAL_CREAM | Freq: Three times a day (TID) | CUTANEOUS | Status: DC
  Filled 2024-04-30 (×2): qty 60

## 2024-04-30 NOTE — Progress Notes (Signed)
 Pt refusing care at this time. Refused thoracentesis, refusing labs, refusing to communicate. Pt stated, Leave me the hell alone. MD made aware at bedside. Will continue to monitor closely.

## 2024-04-30 NOTE — Progress Notes (Signed)
 Patients daughter called for an update, MD made aware.

## 2024-04-30 NOTE — Progress Notes (Signed)
 [facility] Progress Note  Name: Cooper Moroney Farrel MRN: [id number] DOB: [birth year]   Subjective:  he had 700 mL uop over 7/15. Bicarb gtt was stopped last night.  Per nurse he had over 600 mL on bladder scan - she is asking about a foley and I agree.  He states that his daughter would help with decisions if needed.  He would never want dialysis under any circumstances and I encouraged him to discuss this with his family.  Team has consulted palliative per nursing.  He declined a thora today.   Review of systems:  He denies shortness of breath or chest pain  Denies n/v  --------------------- Background on consult:  Bird S Septer is a 83 y.o. male with a history of diabetes, hypertension, coronary artery disease status post CABG, diastolic heart failure, paroxysmal atrial fibrillation and CKD who presented to the hospital on 7/14.  He states he came with pneumonia and then states that he doesn't know why he came.  Lives at SNF.  Wasn't short of breath.  States eating and drinking ok.  He was found to have AKI.  Baseline creatinine is mid to high twos as of earlier this June.  He has had multiple prior AKI events.  Recently here 6/20-6/23/25 with the same.  Followed in clinic by Dr. Macel.  He was admitted with creatinine of 4.96 and this has improved slightly to 4.69.  He has had a bicarb infusion.  He has had concurrent hyperkalemia which is improving with medical management.  Nephrology is consulted for assistance with management of AKI.  He had 200 mL UOP over 7/14.  He states that he hasn't discussed dialysis with his outpatient nephrologist and he doesn't know what he would want to do if kidneys worsened.  His daughter would help with decisions if needed.        Intake/Output Summary (Last 24 hours) at 04/30/2024 1601 Last data filed at 04/30/2024 1303 Gross per 24 hour  Intake 1020 ml  Output 697 ml  Net 323 ml    Vitals:  Vitals:   04/30/24 0849 04/30/24 1254  04/30/24 1300 04/30/24 1601  BP:  101/65  92/60  Pulse:  87    Resp: 16 20    Temp:   97.9 F (36.6 C)   TempSrc:   Axillary   SpO2:  95%    Weight:      Height:         Physical Exam:  General:  elderly male in bed in NAD  HEENT: NCAT  Eyes: EOMI sclera anicteric Neck: supple trachea midline  Heart: S1S2 no rub Lungs: clear but reduced to auscultation bilaterally; normal work of breathing  Abdomen: soft/nd/nt Extremities: no edema appreciated; no cyanosis or clubbing Skin: no rash on extremities exposed Psych: no anxiety or agitation Neuro:  alert and oriented x 3; follows commands and interactive  Gu no foley   Medications reviewed   Labs:     Latest Ref Rng & Units 04/30/2024   12:08 PM 04/30/2024    5:02 AM 04/30/2024   12:16 AM  BMP  Glucose 70 - 99 mg/dL  883    BUN 8 - 23 mg/dL  899    Creatinine 9.38 - 1.24 mg/dL  5.01    Sodium 864 - 854 mmol/L  134    Potassium 3.5 - 5.1 mmol/L 5.5  4.8  5.2   Chloride 98 - 111 mmol/L  99    CO2 22 - 32  mmol/L  19    Calcium  8.9 - 10.3 mg/dL  7.4       Assessment/Plan:   # AKI  - Pre-renal and ischemic insults - hypotension and diuretics to optimize CHF. Renal US  with no hydro; note increased echogenicity - Hold farxiga  and torsemide   - Start NS at 75 ml/hr x 24 hours     - reordered strict ins/outs  - placing foley as below   # urinary retention - coupled with AKI and over 600 mL on bladder scan  - will insert foley; order placed   # CKD stage IV  - Followed by Macel - Baseline Cr mid to high 2's   # Hyperkalemia - Continue bicarb gtt - improving with medical management - repeat lokelma    # Chronic diastolic CHF  - caution with fluids  - holding torsemide  and farxiga  as above   # Metabolic acidosis - s/p bicarb gtt    # HTN  - controlled and has been borderline low   - resuming gentle fluids   # Anemia of CKD - mild - no indication for ESA  Disposition - continue inpatient monitoring    Katheryn JAYSON Saba, MD 04/30/2024 4:33 PM

## 2024-04-30 NOTE — Progress Notes (Addendum)
 [facility] Progress Note  Patient: Jake Gross     FMW:[id number]  DOA: 04/28/2024   PCP: Wendee Lynwood HERO, NP       Brief hospital course: This is a 83 year old male with hypertension, diabetes mellitus, coronary artery disease status post CABG, chronic HFpEF, paroxysmal atrial fibrillation, chronic kidney disease stage IV who presented to the hospital for lethargy.  He was found to have a large pleural effusion and ascites and enlarged abdominal lymph nodes.  The patient was also noted to have a creatinine of 4.96 which was elevated from his baseline of 2.95 and a potassium of 6.1.  He was treated with IV fluids and Lokelma .  Subjective:  He is yelling at me and refusing to participate in a conversation.    Assessment and Plan: Principal Problem:   Acute renal failure 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 (HCC) Metabolic acidosis - Appreciate nephrology assistance - Continue IV fluids - I have spoken with the patient's daughter regarding his oral intake at SNF and it appears that the patient has had poor oral intake as outpatient-continue to hold diuretics and continue IV fluids per nephrology -The patient also is found to have bladder outlet obstruction and has greater than 600 cc on bladder scan-Foley catheter ordered for  and O and also because he is incontinent and sitting in stool and urine- he has refused this.  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 in 2016 - According to my discussion with the patient's daughter, the patient opted not to continue surveillance colonoscopies as outpatient and at this  time, would not want chemotherapy - In light of his poor oral intake, refusal of care and treatments and probable underlying cancer, I have asked for a palliative care consult - Highly doubtful that this patient has a recurrent pneumonia-will DC antibiotics    Paroxysmal atrial fibrillation (HCC) History of CVA - -will continue low-dose Eliquis     Diastolic CHF, chronic - Agree with holding diuretics in setting of AKI - Current pleural effusion and ascites are likely secondary to underlying metastatic cancer    Thrombocytopenia (HCC) -Acute on chronic-avoid heparin  and follow  Code status discussed with patient today. He would like to be DNR.     Code Status: Full Code Total time on patient care: 40 minutes DVT prophylaxis: Eliquis   Objective:   Vitals:   04/30/24 0849 04/30/24 1254 04/30/24 1300 04/30/24 1601  BP:  101/65  92/60  Pulse:  87    Resp: 16 20    Temp:   97.9 F (36.6 C)   TempSrc:   Axillary   SpO2:  95%    Weight:      Height:       Filed Weights   04/28/24 1648 04/29/24 0442 04/30/24 0511  Weight: 77.2 kg 78.8 kg 80.4 kg   Exam: Patient will not sit up to allow proper exam General exam: Appears comfortable  Respiratory system: Poor breath sounds in the right lung field Cardiovascular system: S1 & S2 heard  Gastrointestinal system: Abdomen soft, non-tender, distended  Extremities: No cyanosis, clubbing or edema Psychiatry: Irritable and yelling to be left alone    CBC: Recent Labs  Lab 04/28/24 1726 04/29/24 0847 04/30/24 0502  WBC 14.3* 12.8* 12.7*  NEUTROABS 12.1*  --   --   HGB 12.0* 11.0* 10.6*  HCT 38.2* 35.6* 33.4*  MCV 82.2 83.8 81.7  PLT 107* 83* 94*   Basic Metabolic Panel: Recent Labs  Lab 04/28/24 1726 04/28/24 2146 04/29/24 0847 04/29/24 1251 04/29/24 1629 04/29/24 2040 04/30/24 0016 04/30/24 0502 04/30/24 1208  NA 137  --  136  --   --   --   --  134*  --   K 6.1*   < > 5.7*   < > 5.5* 5.6* 5.2* 4.8 5.5*  CL 108   --  105  --   --   --   --  99  --   CO2 14*  --  14*  --   --   --   --  19*  --   GLUCOSE 174*  --  171*  --   --   --   --  116*  --   BUN 110*  --  109*  --   --   --   --  100*  --   CREATININE 4.96*  --  4.69*  --   --   --   --  4.98*  --   CALCIUM  8.1*  --  7.6*  --   --   --   --  7.4*  --    < > = values in this interval not displayed.     Scheduled Meds:  apixaban   2.5 mg Oral BID   azithromycin   500 mg Oral Daily   insulin  aspart  0-5 Units Subcutaneous QHS   insulin  aspart  0-6 Units Subcutaneous TID WC   sodium chloride  flush  3 mL Intravenous Q12H   sodium zirconium cyclosilicate   10 g Oral Once    Imaging and lab data personally reviewed   Author: Joshual Terrio  04/30/2024 4:23 PM  To contact [facility]>   Check the care team in [facility] and look for the attending/consulting [facility] provider listed  Log into [email or web address] and use [facility] universal password   Go to> [facility]  and find provider  If you still have difficulty reaching the provider, please page the [facility] (Director on Call) for the Hospitalists listed on amion

## 2024-04-30 NOTE — Progress Notes (Signed)
 Patient ID: Jake Gross, male   DOB: 1940-11-27, 83 y.o.   MRN: [id number] Patient presented to ultrasound dept today for thoracentesis.  Upon questioning patient he stated that he did not want to undergo thoracentesis at this time.  Dr. Earley notified.  Patient returned back to room.

## 2024-04-30 NOTE — Progress Notes (Signed)
 Pt with BP of 92/60(MAP 71) , HR 95, O2 94 on RA, RR 16. Pt lying in bed lethargic, will open eyes to voice. Only 17m of urine output this shift. Bladder scanner reads >665ml. MD made aware, awaiting orders.

## 2024-04-30 NOTE — Progress Notes (Signed)
 Pt's daughter [name] has concerns for SNF placement after d/c, stating she thinks [facility] gave up patient's bed there. Daughter states she will be here for palliative meeting tomorrow and hopes to speak with social worker about options regarding placement.

## 2024-04-30 NOTE — Progress Notes (Addendum)
 Attempted to place foley catheter, pt began to cry out once catheter tip inserted, Take it out, take it out, stop, remove it. As patient is axo to self, place, time and situation, RN removed catheter.   RN attempted to educate on the importance of foley insertion, but patient declined for foley to be placed. MD made aware.   Note--before foley attempted patient did have an unmeasured void on bed pad. It saturated the pad to the sheets.   Pt bottom is raw  due to previous loose stools. MD aware, butt cream ordered and administered.

## 2024-05-01 DIAGNOSIS — Z7189 Other specified counseling: Secondary | ICD-10-CM | POA: Diagnosis not present

## 2024-05-01 DIAGNOSIS — Z515 Encounter for palliative care: Secondary | ICD-10-CM | POA: Diagnosis not present

## 2024-05-01 DIAGNOSIS — N184 Chronic kidney disease, stage 4 (severe): Secondary | ICD-10-CM | POA: Diagnosis not present

## 2024-05-01 DIAGNOSIS — Z79899 Other long term (current) drug therapy: Secondary | ICD-10-CM | POA: Diagnosis not present

## 2024-05-01 DIAGNOSIS — Z66 Do not resuscitate: Secondary | ICD-10-CM

## 2024-05-01 DIAGNOSIS — R5381 Other malaise: Secondary | ICD-10-CM

## 2024-05-01 DIAGNOSIS — J189 Pneumonia, unspecified organism: Secondary | ICD-10-CM | POA: Diagnosis not present

## 2024-05-01 DIAGNOSIS — R638 Other symptoms and signs concerning food and fluid intake: Secondary | ICD-10-CM

## 2024-05-01 DIAGNOSIS — N179 Acute kidney failure, unspecified: Secondary | ICD-10-CM | POA: Diagnosis not present

## 2024-05-01 DIAGNOSIS — C786 Secondary malignant neoplasm of retroperitoneum and peritoneum: Secondary | ICD-10-CM

## 2024-05-01 LAB — CBC
HCT: 36.7 % — ABNORMAL LOW (ref 39.0–52.0)
Hemoglobin: 11.5 g/dL — ABNORMAL LOW (ref 13.0–17.0)
MCH: 26 pg (ref 26.0–34.0)
MCHC: 31.3 g/dL (ref 30.0–36.0)
MCV: 82.8 fL (ref 80.0–100.0)
Platelets: 115 K/uL — ABNORMAL LOW (ref 150–400)
RBC: 4.43 MIL/uL (ref 4.22–5.81)
RDW: 15.5 % (ref 11.5–15.5)
WBC: 13 K/uL — ABNORMAL HIGH (ref 4.0–10.5)
nRBC: 0.4 % — ABNORMAL HIGH (ref 0.0–0.2)

## 2024-05-01 LAB — BASIC METABOLIC PANEL WITH GFR
Anion gap: 17 — ABNORMAL HIGH (ref 5–15)
BUN: 112 mg/dL — ABNORMAL HIGH (ref 8–23)
CO2: 17 mmol/L — ABNORMAL LOW (ref 22–32)
Calcium: 7.3 mg/dL — ABNORMAL LOW (ref 8.9–10.3)
Chloride: 101 mmol/L (ref 98–111)
Creatinine, Ser: 4.84 mg/dL — ABNORMAL HIGH (ref 0.61–1.24)
GFR, Estimated: 11 mL/min — ABNORMAL LOW (ref >60)
Glucose, Bld: 154 mg/dL — ABNORMAL HIGH (ref 70–99)
Potassium: 4.7 mmol/L (ref 3.5–5.1)
Sodium: 135 mmol/L (ref 135–145)

## 2024-05-01 LAB — GLUCOSE, CAPILLARY
Glucose-Capillary: 141 mg/dL — ABNORMAL HIGH (ref 70–99)
Glucose-Capillary: 186 mg/dL — ABNORMAL HIGH (ref 70–99)
Glucose-Capillary: 172 mg/dL — ABNORMAL HIGH (ref 70–99)

## 2024-05-01 MED ORDER — LORAZEPAM 2 MG/ML PO CONC
0.5000 mg | ORAL | Status: DC | PRN
  Filled 2024-05-01: qty 0.25

## 2024-05-01 MED ORDER — LOPERAMIDE HCL 2 MG PO CAPS
2.0000 mg | ORAL_CAPSULE | Freq: Four times a day (QID) | ORAL | Status: DC | PRN
  Administered 2024-05-04 – 2024-05-05 (×2): 2 mg via ORAL
  Filled 2024-05-01 (×2): qty 1

## 2024-05-01 MED ORDER — OXYCODONE HCL 20 MG/ML PO CONC
5.0000 mg | ORAL | Status: DC | PRN
  Administered 2024-05-05: 5 mg via ORAL
  Filled 2024-05-01 (×3): qty 0.3

## 2024-05-01 MED ORDER — OXYCODONE HCL 20 MG/ML PO CONC
5.0000 mg | ORAL | Status: DC | PRN
  Filled 2024-05-01: qty 0.3

## 2024-05-01 MED ORDER — HALOPERIDOL 0.5 MG PO TABS: 0.5000 mg | ORAL_TABLET | ORAL | Status: DC | PRN

## 2024-05-01 MED ORDER — POLYVINYL ALCOHOL 1.4 % OP SOLN: 1.0000 [drp] | Freq: Four times a day (QID) | OPHTHALMIC | Status: DC | PRN

## 2024-05-01 MED ORDER — HYDROMORPHONE HCL 1 MG/ML IJ SOLN
1.0000 mg | INTRAMUSCULAR | Status: DC | PRN
  Filled 2024-05-01: qty 1

## 2024-05-01 MED ORDER — GLYCOPYRROLATE 0.2 MG/ML IJ SOLN
0.2000 mg | INTRAMUSCULAR | Status: DC | PRN
Start: 2024-05-01 — End: 2024-05-06

## 2024-05-01 MED ORDER — HALOPERIDOL LACTATE 5 MG/ML IJ SOLN: 0.5000 mg | INTRAMUSCULAR | Status: DC | PRN

## 2024-05-01 MED ORDER — BIOTENE DRY MOUTH MT LIQD: 15.0000 mL | OROMUCOSAL | Status: DC | PRN

## 2024-05-01 MED ORDER — GLYCOPYRROLATE 0.2 MG/ML IJ SOLN: 0.2000 mg | INTRAMUSCULAR | Status: DC | PRN

## 2024-05-01 MED ORDER — HALOPERIDOL LACTATE 2 MG/ML PO CONC: 0.5000 mg | ORAL | Status: DC | PRN

## 2024-05-01 MED ORDER — GLYCOPYRROLATE 1 MG PO TABS: 1.0000 mg | ORAL_TABLET | ORAL | Status: DC | PRN

## 2024-05-01 MED ORDER — LORAZEPAM 0.5 MG PO TABS
0.5000 mg | ORAL_TABLET | ORAL | Status: DC | PRN
  Administered 2024-05-02: 0.5 mg via ORAL
  Filled 2024-05-01 (×2): qty 1

## 2024-05-01 MED ORDER — HYDROMORPHONE HCL 1 MG/ML IJ SOLN
0.2500 mg | Freq: Once | INTRAMUSCULAR | Status: AC
  Administered 2024-05-01: 0.25 mg via INTRAVENOUS
  Filled 2024-05-01: qty 0.5

## 2024-05-01 NOTE — Consult Note (Signed)
 Consultation Note Date: 05/01/2024   Patient Name: Jake Gross  DOB: 12-17-1940  MRN: [id number]  Age / Sex: 83 y.o., male   PCP: Wendee Lynwood HERO, NP Referring Physician: Earley Saucer, MD  Reason for Consultation: Establishing goals of care     Chief Complaint/History of Present Illness:   Patient is a 83 year old male with past medical history of hypertension, diabetes mellitus, CAD status post CABG, HFpEF, paroxysmal atrial fibrillation, and CKD stage IV who was admitted on 04/28/2024 for lethargy.  Upon admission patient found to have large pleural effusion and ascites along with elevated creatinine.  Patient receiving management for metabolic acidosis, hyperkalemia, large right-sided pleural effusion, and AKI on CKD.  Nephrology consulted for recommendations.  Imaging obtained noted concerns for metastatic cancer and lymph nodes and peritoneal carcinomatosis.  Palliative medicine team consulted to assist with complex medical decision making.  Extensive review of EMR prior to presenting to bedside.  Reviewed recent documentation from nephrology, PT, hospitalist, and radiology.  Patient has been refusing multiple medical interventions including thoracentesis for symptom management.  Reviewed recent lab work noting BMP BUN 112, creatinine 4.84, and GFR 11.  Patient CBC noted WBC elevated to 13 and platelets 115.  Personally reviewed patient's recent CT imaging noting concerns for enlarged lymph nodes in peritoneal carcinomatosis. Discussed care with RN for updates.  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 Individuals present for the conversation: Discussed care  with patient and daughter, Jake Gross.  Summary of the conversation:  Presented to bedside to be with patient.  Patient initially sitting up in chair.  Introduced myself as a member of the palliative medicine team my role in patient's medical journey.  During conversation patient's daughter, Jake Gross, also presented to bedside and was able to introduce myself.  Patient noted he wanted to get back into bed send noted with discussed with staff to assist with this.  Patient has stated he does not want aggressive medical interventions.  Patient did not want dialysis.  Patient was reluctant to even except IV fluids.  During this conversation patient's friends presented to bedside so asked if I could speak to his daughter on his behalf so he could enjoy time with friends and patient agreed with this.  Discussed care with patient's daughter privately.  Spent time reviewing patient's medical status at this time including AKI on CKD and likely metastatic cancer.  Discussed patient has been refusing multiple medical interventions.  Daughter noted that this has been an ongoing problem and that patient has not wanted any aggressive medical interventions.  She noted it took 2 family members begging him to get IV fluids for him to agree with her that.  Discussed that with patient likely having underlying metastatic cancer not wanting to undergo therapies, pursuing biopsy for diagnosis would not help with decision if it would be focusing on his comfort anyway.  Spent time discussing what comfort focused care would look like.  Discussed discontinuing interventions the patient has already been refusing like lab work, IV fluids, and imaging and instead providing medications for pain, nausea vomiting, and agitation.  Discussed with patient's multiple underlying medical illnesses, expects that patient's time is growing shorter.  Noted if patient's kidney function continues to worsen, would likely become more lethargic and sleep  more which is also a sign of  time getting shorter.  Noted would also discussed this with patient.  Daughter agreeing with supporting transition to comfort focused care at this time since patient has stated many times he does not want aggressive medical interventions. Spent time discussing hospice and where this service can be provided.  Discussed home hospice versus long-term care with hospice versus inpatient hospice.  Described generalities regarding requirements for these.  Discussed based on patient's current status, will need 24/7 assistance.  Daughter noted that she and brother cannot provide that for patient at home.  Discussed reaching out to patient's prior facility about potentially returning with long-term care with hospice.  Discussed usual requirements of out-of-pocket payment.  Daughter going to call facility, [facility], and follow-up on this later today.  Noted if patient continues to deteriorate, then could consider referral to inpatient hospice.  Would need to be excepted to inpatient hospice, cannot just transfer there.  Daughter acknowledged this. Daughter inquiring about POA documentation.  Spent time discussing differences between POA and HCPOA documentation.  Noted lawyer would be required to assist with POA documentation and hospital cannot assist with this.  Did note hospital can assist with [facility] documentation completion.  Daughter agreeing with this.  Noted would consult chaplain to assist. Spent time answering questions and providing emotional support via active listening.  Daughter and this provider we presented to bedside.  Patient agreeing to engage in conversation at that time.  Discussed patient's diagnoses and discussed that based on his wishes, can focus on comfort at this time.  Discussed what comfort focused care would and would not entail.  Patient agreeing that he wants to focus on comfort only at this time and does not want aggressive medical interventions.  Noted would  provide medications to assist with symptoms at end-of-life.  Patient denied having any pain at this time.  Noted medication would be available if needed.  Did inquire with patient that if he was unable to speak for himself to make medical decisions for himself, who would he want to make medical decisions for on his behalf.  Patient noted that he would want his daughter, Jake Gross, to make medical decisions on his behalf if he was unable to.  Noted would involve chaplain to assist with this.  Patient agreeing with this. Spent time answering questions as able.  Noted palliative medicine team will continue to follow with patient's medical journey.  Did attempt to call patient's son, Jake Gross, to provide medical updates.  There was no answer upon calls.  Will attempt to call at a later time when able.  Outcome of the conversations and/or documents completed:  Transition to comfort focused care at this time.  I spent 50 minutes providing separately identifiable ACP services with the patient and/or surrogate decision maker in a voluntary, in-person conversation discussing the patient's wishes and goals as detailed in the above note.  Tinnie Radar, DO Palliative Medicine Provider  -------------------------------------------------------------------------------------------------------------  Discussed care with team including hospitalist, RN, TOC, and nephrologist regarding transition to comfort focused care.  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    Arthritis    AVM (arteriovenous malformation) of small bowel, 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   CAD (coronary artery disease) 09/29/2019   Cervical spondylosis    Chronic anticoagulation    Chronic diastolic CHF (congestive heart failure) (HCC) 12/17/2018   CKD (chronic kidney disease) 12/17/2018   Diabetes mellitus    History of GI bleed 09/29/2019   Hyperlipidemia    Hypertension    Iron deficiency anemia due to chronic blood loss    Paroxysmal atrial fibrillation (HCC) 12/17/2018   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   Vitamin D deficiency    Social History   Socioeconomic History   Marital status: Divorced    Spouse name: Not on file   Number of children: 2   Years of education: Not on file   Highest education level: Not on file  Occupational History   Occupation: retired    Associate Professor: [employer]  Tobacco Use   Smoking status: Former    Current packs/day: 0.00    Types: Cigarettes    Quit date: 08/13/1994    Years since quitting: 29.7   Smokeless tobacco: Never  Vaping Use   Vaping status: Never Used  Substance and Sexual Activity   Alcohol  use: No    Alcohol /week: 0.0 standard drinks of alcohol    Drug use: No   Sexual activity: Not Currently    Birth control/protection: None  Other Topics Concern   Not on file  Social History Narrative   Not on file   Social Drivers of Health   Financial Resource Strain: Low Risk  (12/11/2023)   Overall Financial Resource Strain (CARDIA)    Difficulty of Paying Living Expenses: Not hard at all  Food Insecurity: No Food Insecurity (04/05/2024)   Hunger Vital Sign    Worried About Running Out of Food in the Last Year: Never true    Ran Out of Food in the Last Year: Never true  Transportation Needs: No Transportation Needs (04/05/2024)   PRAPARE - Administrator, Civil Service (Medical): No    Lack of Transportation  (Non-Medical): No  Physical Activity: Insufficiently Active (12/11/2023)   Exercise Vital Sign    Days of Exercise per Week: 4 days    Minutes of Exercise per Session: 20 min  Stress: No Stress Concern Present (12/11/2023)   [employer] of Occupational Health - Occupational Stress Questionnaire    Feeling of Stress : Not at all  Social Connections: Moderately Isolated (04/05/2024)   Social Connection and Isolation Panel    Frequency of Communication with Friends and Family: Three times a week    Frequency of Social Gatherings with Friends and Family: More than three times a week    Attends Religious Services: 1 to 4 times per year    Active Member of [employer] or Organizations: No    Attends Banker Meetings: Never    Marital Status: Widowed   Family History  Problem Relation Age of Onset   Cancer Father        all over   Coronary artery disease Brother    Diabetes Brother    Stroke Mother    Diabetes Mother    Cancer Brother    Colon cancer Neg Hx    Esophageal cancer Neg Hx    Rectal cancer Neg Hx    Stomach cancer Neg Hx    Scheduled Meds:  apixaban   2.5 mg Oral BID   Chlorhexidine  Gluconate Cloth  6 each Topical Daily   Gerhardt's butt cream   Topical TID   insulin  aspart  0-5 Units Subcutaneous QHS   insulin  aspart  0-6 Units Subcutaneous TID WC   sodium chloride  flush  3 mL Intravenous Q12H   Continuous Infusions:  sodium chloride  75 mL/hr at 05/01/24 0648   PRN Meds:.acetaminophen  **OR** acetaminophen , prochlorperazine , senna No Known Allergies CBC:    Component Value Date/Time   WBC 13.0 (H) 05/01/2024 0530   HGB 11.5 (L) 05/01/2024 0530   HGB 11.8 (L) 05/02/2022 0845   HGB 10.0 (L) 09/26/2019 1150   HCT 36.7 (L) 05/01/2024 0530   HCT 33.3 (L) 09/26/2019 1150   PLT 115 (L) 05/01/2024 0530   PLT 246 05/02/2022 0845   PLT 229 09/26/2019 1150   MCV 82.8 05/01/2024 0530   MCV 82 09/26/2019 1150   NEUTROABS 12.1 (H) 04/28/2024 1726    NEUTROABS 5 01/19/2016 0000   LYMPHSABS 0.7 04/28/2024 1726   MONOABS 1.3 (H) 04/28/2024 1726   EOSABS 0.0 04/28/2024 1726   BASOSABS 0.0 04/28/2024 1726   Comprehensive Metabolic Panel:    Component Value Date/Time   NA 135 05/01/2024 0530   NA 140 05/04/2023 1054   K 4.7 05/01/2024 0530   CL 101 05/01/2024 0530   CO2 17 (L) 05/01/2024 0530   BUN 112 (H) 05/01/2024 0530   BUN 21 05/04/2023 1054   CREATININE 4.84 (H) 05/01/2024 0530   CREATININE 2.43 (H) 01/25/2024 1549   GLUCOSE 154 (H) 05/01/2024 0530   CALCIUM  7.3 (L) 05/01/2024 0530   AST 54 (H) 04/29/2024 0848   AST 16 05/02/2022 0845   ALT 21 04/29/2024 0848   ALT 11 05/02/2022 0845   ALKPHOS 187 (H) 04/29/2024 0848   BILITOT 0.7 04/29/2024 0848   BILITOT 0.6 05/02/2022 0845   PROT 5.6 (L) 04/29/2024 0848   ALBUMIN  1.7 (L) 04/29/2024 0848    Physical Exam: Vital Signs: BP 126/69 (BP Location: Right Arm)   Pulse 93   Temp 98.7 F (37.1 C) (Oral)   Resp 20   Ht 5' 7 (1.702 m)   Wt 80.4 kg   SpO2 97%   BMI 27.76 kg/m  SpO2: SpO2: 97 % O2 Device: O2 Device: Room Air O2 Flow Rate:   Intake/output summary:  Intake/Output Summary (Last 24 hours) at 05/01/2024 0736 Last data filed at 05/01/2024 0000 Gross per 24 hour  Intake 1410 ml  Output 225 ml  Net 1185 ml   LBM: Last BM Date : 04/30/24 Baseline Weight: Weight: 77.2 kg Most recent weight: Weight: 80.4 kg  General: NAD, alert, chronically ill-appearing Cardiovascular: RRR Respiratory: no increased work of breathing noted, not in respiratory distress Abdomen: distended Neuro: A&Ox4, following commands easily Psych: appropriately answers all questions          Palliative Performance Scale: 40%              Additional Data Reviewed: Recent Labs    04/30/24 0502 05/01/24 0530  WBC 12.7* 13.0*  HGB 10.6* 11.5*  PLT 94* 115*  NA 134* 135  BUN 100* 112*  CREATININE 4.98* 4.84*    Imaging: CT CHEST WO CONTRAST CLINICAL DATA:  Right pleural  effusion.  * Tracking Code: BO *  EXAM: CT CHEST WITHOUT CONTRAST  TECHNIQUE: Multidetector CT imaging of the chest was performed following the standard protocol without IV contrast.  RADIATION DOSE REDUCTION: This exam was performed according to  the departmental dose-optimization program which includes automated exposure control, adjustment of the mA and/or kV according to patient size and/or use of iterative reconstruction technique.  COMPARISON:  Chest radiograph dated 04/28/2024, CT chest dated 09/27/2022  FINDINGS: Cardiovascular: Normal heart size. No significant pericardial fluid/thickening. Ascending thoracic aorta measures 4.3 cm. Coronary artery calcifications and aortic atherosclerosis.  Mediastinum/Nodes: Imaged thyroid  gland without nodules meeting criteria for imaging follow-up by size. Normal esophagus. No pathologically enlarged axillary, supraclavicular, mediastinal, or hilar lymph nodes.  Lungs/Pleura: The central airways are patent. Right lower lobe compressive atelectasis. No pneumothorax. Large right pleural effusion.  Upper abdomen: Cholelithiasis. Moderate volume ascites with nodularity in the bilateral upper quadrants. Increased size of upper abdominal lymph nodes measuring up to 10 mm (2:143), previously 5 mm. Right periphrenic lymph node measures 12 mm (2:115).  Musculoskeletal: New comminuted left clavicle fracture. Median sternotomy wires are nondisplaced. Multilevel degenerative changes of the thoracic spine. Partially imaged lipoma anterior to the left shoulder (2:1).  IMPRESSION: 1. Large right pleural effusion with right lower lobe compressive atelectasis. 2. Moderate volume ascites with nodularity in the bilateral upper quadrants, suspicious for peritoneal disease. 3. Increased size of upper abdominal lymph nodes measuring up to 10 mm, previously 5 mm, suspicious for metastatic disease. 4. New comminuted left clavicle fracture. 5.  Ascending thoracic aorta measures 4.3 cm. Recommend annual imaging followup by CTA or MRA. This recommendation follows 2010 ACCF/AHA/AATS/ACR/ASA/SCA/SCAI/SIR/STS/SVM Guidelines for the Diagnosis and Management of Patients with Thoracic Aortic Disease. Circulation. 2010; 121: Z733-z630. Aortic aneurysm NOS (ICD10-I71.9) 6. Aortic Atherosclerosis (ICD10-I70.0). Coronary artery calcifications. Assessment for potential risk factor modification, dietary therapy or pharmacologic therapy may be warranted, if clinically indicated.  Electronically Signed   By: Limin  Xu M.D.   On: 04/29/2024 10:39    I personally reviewed recent imaging.   Palliative Care Assessment and Plan Summary of Established Goals of Care and Medical Treatment Preferences   Patient is a 83 year old male with past medical history of hypertension, diabetes mellitus, CAD status post CABG, HFpEF, paroxysmal atrial fibrillation, and CKD stage IV who was admitted on 04/28/2024 for lethargy.  Upon admission patient found to have large pleural effusion and ascites along with elevated creatinine.  Patient receiving management for metabolic acidosis, hyperkalemia, large right-sided pleural effusion, and AKI on CKD.  Nephrology consulted for recommendations.  Imaging obtained noted concerns for metastatic cancer and lymph nodes and peritoneal carcinomatosis.  Palliative medicine team consulted to assist with complex medical decision making.  # Complex medical decision making/goals of care  -# Complex medical decision making/goals of care  - Discussed care with patient and daughter as detailed above in HPI.  Explained patient's underlying medical diagnoses including AKI on CKD stage IV and likely metastatic cancer.  Patient does not want to pursue aggressive medical interventions.  Discussed transition to comfort focused care at this time.  Discussed time is growing shorter based on patient's multiple medical comorbidities.  At this time  considering hospice support either at long-term care facility or inpatient hospice if patient acutely deteriorates.  Patient and daughter agreement with this plan.  Daughter going to reach out to facility patient came from to determine if he can return with hospice.  Did attempt to reach patient's son via phone without success.  TOC involved to assist with coordination of care.  -At this time we will discontinue interventions that are no longer focused on comfort such as IV fluids, imaging, or lab work.  Will instead focus on symptom management of pain,  dyspnea, and agitation in the setting of end-of-life care.  - Patient stated that if he was unable to make medical decisions for himself, he would want his daughter Bari to make medical decisions on his behalf.  Will involve chaplain to assist with [facility] documentation completion.    Code Status: Do not attempt resuscitation (DNR) - Comfort care  # Symptom management Patient is receiving these palliative interventions for symptom management with an intent to improve quality of life.     -Pain/Dyspnea, acute in the setting of end-of-life care                Patient was not on medications for pain previously.                               -Start IV Dilaudid  1 mg every 2 hours as needed breakthrough pain or shortness of breath   - Start as needed oxycodone  5 mg every 2 hours as needed p.o./sublingual                 -Anxiety/agitation, in the setting of end-of-life care                               -Start Ativan  p.o./sublingual.5 mg every 4 hours as needed. Continue to adjust based on patient's symptom burden.                                 -Start Haldol  0.5 mg every 4 hours as needed. Continue to adjust based on patient's symptom burden.                   -Secretions, in the setting of end-of-life care                               -Start glycopyrrolate  0.2 mg every 4 hours as needed.   - Diarrhea   - Start Imodium  2 mg every 6 hours as needed.   Monitor for constipation in setting of use of this.  # Psycho-social/Spiritual Support:  - Support System: Daughter, son - Desire for further Chaplain support:yes  # Discharge Planning:  To Be Determined - Considering returning to long-term care facility with hospice versus inpatient hospice if deteriorates  Thank you for allowing the palliative care team to participate in the care Jake Gross.  Tinnie Radar, DO Palliative Care Provider PMT # [phone]  If patient remains symptomatic despite maximum doses, please call PMT at [phone] between 0700 and 1900. Outside of these hours, please call attending, as PMT does not have night coverage.  Billing based on MDM: High  Problems Addressed: One or more chronic illnesses with severe exacerbation, progression, or side effects of treatment.  Amount and/or Complexity of Data: Category 1:Review of prior external note(s) from each unique source, Review of the result(s) of each unique test, and Assessment requiring an independent historian(s)  Risks: Parenteral controlled substances and Decision regarding hospitalization or escalation of hospital care

## 2024-05-01 NOTE — Evaluation (Signed)
 Physical Therapy Evaluation Patient Details Name: Jake Gross MRN: [id number] DOB: 1941/10/15 Today's Date: 05/01/2024  History of Present Illness  Pt is an 83 y.o. male who presents with lethargy and abnormal chest X-ray.  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 Patient was recently admitted with AKI and had a couple hospital admissions in June.  PMHx: T2DM, HTN, HLD, CAD s/p CABG, diastolic heart failure, paroxysmal Afib, GI bleed, CVA, and CKD.  Clinical Impression  Pt admitted with above diagnosis.  Pt currently with functional limitations due to the deficits listed below (see PT Problem List). Pt will benefit from acute skilled PT to increase their independence and safety with mobility to allow discharge.  Pt apparently admitted from SNF and to return upon d/c per chart review.  Also per notes, pt has been refusing care this admission, however did request OOB to recliner today.  Pt requiring physical assist to transfer at this time.  Recommend return to SNF to complete rehab if pt/family agreeable.         If plan is discharge home, recommend the following: Assist for transportation;Help with stairs or ramp for entrance;Assistance with cooking/housework;A lot of help with walking and/or transfers;A lot of help with bathing/dressing/bathroom   Can travel by private vehicle        Equipment Recommendations None recommended by PT  Recommendations for Other Services       Functional Status Assessment Patient has had a recent decline in their functional status and demonstrates the ability to make significant improvements in function in a reasonable and predictable amount of time.     Precautions / Restrictions Precautions Precautions: Fall      Mobility  Bed Mobility Overal bed mobility: Needs Assistance Bed Mobility: Supine to Sit     Supine to sit: Mod assist     General bed mobility comments:  assist for trunk upright    Transfers Overall transfer level: Needs assistance Equipment used: Rolling walker (2 wheels) Transfers: Sit to/from Stand, Bed to chair/wheelchair/BSC Sit to Stand: Min assist, Mod assist, +2 safety/equipment Stand pivot transfers: Mod assist, Min assist, +2 safety/equipment         General transfer comment: multimodal cues for technique and safety; attempted to perform pericare (since pt had loose stool on bed pad) however pt in pain and rushing to get over to recliner (NT notified)    Ambulation/Gait                  Stairs            Wheelchair Mobility     Tilt Bed    Modified Rankin (Stroke Patients Only)       Balance Overall balance assessment: Needs assistance Sitting-balance support: No upper extremity supported, Feet supported Sitting balance-Leahy Scale: Fair     Standing balance support: Bilateral upper extremity supported, During functional activity, Reliant on assistive device for balance Standing balance-Leahy Scale: Poor                               Pertinent Vitals/Pain Pain Assessment Pain Assessment: No/denies pain Pain Intervention(s): Repositioned, Monitored during session    Home Living Family/patient expects to be discharged to:: Private residence Living Arrangements: Alone Available Help at Discharge: Family;Available PRN/intermittently (son and daughter live near by) Type of Home: House Home Access: Stairs to enter Entrance Stairs-Rails: Right;Left;Can reach both Entrance  Stairs-Number of Steps: 4   Home Layout: One level Home Equipment: Cane - single Librarian, academic (2 wheels);BSC/3in1 Additional Comments: Pt was at [facility] for short-term rehab just prior to this admission    Prior Function Prior Level of Function : History of Falls (last six months);Independent/Modified Independent;Driving (all information from admission 3 weeks ago)             Mobility  Comments: Just prior to this admission, pt was neededing assistance for transfers/mobility at [facility]. Prior to last admission, pt was performing funcitonal mobility Mod I with a RW. ADLs Comments: Reports Independent to Mod I just prior to this admission. Independent and driving prior to last admission. Per chart review, pt's son assists with IADLs     Extremity/Trunk Assessment        Lower Extremity Assessment Lower Extremity Assessment: Generalized weakness    Cervical / Trunk Assessment Cervical / Trunk Assessment: Kyphotic  Communication   Communication Communication: Impaired Factors Affecting Communication: Hearing impaired    Cognition Arousal: Alert Behavior During Therapy: WFL for tasks assessed/performed, Flat affect   PT - Cognitive impairments: Safety/Judgement                       PT - Cognition Comments: Pt with poor safety awareness.  very flat, not answering questions, just wanted to get OOB to recliner Following commands: Intact       Cueing Cueing Techniques: Verbal cues     General Comments      Exercises     Assessment/Plan    PT Assessment Patient needs continued PT services  PT Problem List Decreased activity tolerance;Decreased balance;Decreased mobility;Decreased knowledge of use of DME;Decreased strength;Decreased safety awareness       PT Treatment Interventions DME instruction;Gait training;Stair training;Functional mobility training;Therapeutic exercise;Therapeutic activities;Balance training;Patient/family education;Wheelchair mobility training    PT Goals (Current goals can be found in the Care Plan section)  Acute Rehab PT Goals PT Goal Formulation: With patient Time For Goal Achievement: 05/15/24 Potential to Achieve Goals: Good    Frequency Min 2X/week     Co-evaluation               AM-PAC PT 6 Clicks Mobility  Outcome Measure Help needed turning from your back to your side while in a flat bed  without using bedrails?: A Lot Help needed moving from lying on your back to sitting on the side of a flat bed without using bedrails?: A Lot Help needed moving to and from a bed to a chair (including a wheelchair)?: A Lot Help needed standing up from a chair using your arms (e.g., wheelchair or bedside chair)?: A Lot Help needed to walk in hospital room?: A Lot Help needed climbing 3-5 steps with a railing? : Total 6 Click Score: 11    End of Session Equipment Utilized During Treatment: Gait belt Activity Tolerance: Patient tolerated treatment well Patient left: in chair;with call bell/phone within reach;with chair alarm set Nurse Communication: Mobility status PT Visit Diagnosis: Difficulty in walking, not elsewhere classified (R26.2);Unsteadiness on feet (R26.81);Other abnormalities of gait and mobility (R26.89)    Time: 1015-1024 PT Time Calculation (min) (ACUTE ONLY): 9 min   Charges:   PT Evaluation $PT Eval Low Complexity: 1 Low   PT General Charges $$ ACUTE PT VISIT: 1 Visit        Tari PT, DPT Physical Therapist Acute Rehabilitation Services Office: [phone]   Tari CROME Payson 05/01/2024, 12:33 PM

## 2024-05-01 NOTE — Progress Notes (Addendum)
 [facility] Progress Note  Patient: Jake Gross     FMW:[id number]  DOA: 04/28/2024   PCP: Wendee Lynwood HERO, NP       Brief hospital course: This is a 83 year old male with hypertension, coronary artery disease status post CABG, chronic HFpEF, paroxysmal atrial fibrillation, chronic kidney disease stage IV who presented to the hospital for lethargy.  He was found to have a large pleural effusion and ascites and enlarged abdominal lymph nodes.  The patient was also noted to have a creatinine of 4.96 which was elevated from his baseline of 2.95 and a potassium of 6.1.  He was treated with IV fluids and Lokelma .  Subjective:  Continues to refuse blood draws today.  Assessment and Plan: Principal Problem:   Acute renal failure 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 (HCC)   Metabolic acidosis - Appreciate nephrology assistance - Continue IV fluids - I have spoken with the patient's daughter regarding his oral intake at SNF and it appears that the patient has had poor oral intake as outpatient-continue to hold diuretics and continue IV fluids per nephrology -The patient also is found to have bladder outlet obstruction and has greater than 600 cc on bladder scan-Foley catheter ordered for  and O and also because he is incontinent-patient declined Foley catheter - Patient declined further blood draws - Upon further conversation between myself the patient and his daughter, DO NOT RESUSCITATE order was placed on 7/16 - Palliative care consulted and patient has been transition to comfort care today  Active Problems: Hyperkalemia - Improved from 6.1-5.5  Large right-sided pleural effusion Ascites with lymphadenopathy highly suggestive of metastatic cancer - Patient has a history of an adenomatous rectal polyp that was resected in 2016 - According to my discussion with the patient's daughter, the patient opted not to continue surveillance colonoscopies as outpatient and at  this time, would not want chemotherapy -Has been transitioned to comfort care  Paroxysmal atrial fibrillation (HCC) History of CVA - Can continue Eliquis  unless he refuses to take it    Diastolic CHF, chronic - Agree with holding diuretics in setting of AKI     Code Status: Do not attempt resuscitation (DNR) - Comfort care Total time on patient care: 30 minutes DVT prophylaxis: Eliquis   Objective:   Vitals:   04/30/24 1938 05/01/24 0031 05/01/24 0442 05/01/24 1245  BP: 103/72 108/71 126/69 107/67  Pulse: 97 87 93 92  Resp:    18  Temp: 98.5 F (36.9 C)  98.7 F (37.1 C) 97.9 F (36.6 C)  TempSrc: Oral  Oral Oral  SpO2: 96% 96% 97% 97%  Weight:   80.4 kg   Height:       Filed Weights   04/29/24 0442 04/30/24 0511 05/01/24 0442  Weight: 78.8 kg 80.4 kg 80.4 kg   Exam: General exam: Appears comfortable-awake alert and oriented x 3 HEENT: oral mucosa moist Respiratory system: Clear to auscultation.  Cardiovascular system: S1 & S2 heard  Gastrointestinal system: Abdomen soft, non-tender, nondistended. Normal bowel sounds   Extremities: No cyanosis, clubbing or edema Psychiatry:  Mood & affect appropriate.    CBC: Recent Labs  Lab 04/28/24 1726 04/29/24 0847 04/30/24 0502 05/01/24 0530  WBC 14.3* 12.8* 12.7* 13.0*  NEUTROABS 12.1*  --   --   --   HGB 12.0* 11.0* 10.6* 11.5*  HCT 38.2* 35.6* 33.4* 36.7*  MCV 82.2 83.8 81.7 82.8  PLT 107* 83* 94* 115*   Basic Metabolic Panel: Recent Labs  Lab 04/28/24 1726 04/28/24 2146 04/29/24 0847 04/29/24 1251 04/29/24 2040 04/30/24 0016 04/30/24 0502 04/30/24 1208 05/01/24 0530  NA 137  --  136  --   --   --  134*  --  135  K 6.1*   < > 5.7*   < > 5.6* 5.2* 4.8 5.5* 4.7  CL 108  --  105  --   --   --  99  --  101  CO2 14*  --  14*  --   --   --  19*  --  17*  GLUCOSE 174*  --  171*  --   --   --  116*  --  154*  BUN 110*  --  109*  --   --   --  100*  --  112*  CREATININE 4.96*  --  4.69*  --   --   --  4.98*   --  4.84*  CALCIUM  8.1*  --  7.6*  --   --   --  7.4*  --  7.3*   < > = values in this interval not displayed.     Scheduled Meds:  apixaban   2.5 mg Oral BID   Chlorhexidine  Gluconate Cloth  6 each Topical Daily   Gerhardt's butt cream   Topical TID   insulin  aspart  0-5 Units Subcutaneous QHS   insulin  aspart  0-6 Units Subcutaneous TID WC   sodium chloride  flush  3 mL Intravenous Q12H    Imaging and lab data personally reviewed   Author: Jamilla Galli  05/01/2024 2:05 PM  To contact [facility]>   Check the care team in [facility] and look for the attending/consulting [facility] provider listed  Log into [email or web address] and use [place]'s universal password   Go to> [facility]  and find provider  If you still have difficulty reaching the provider, please page the [facility] (Director on Call) for the Hospitalists listed on amion

## 2024-05-01 NOTE — Plan of Care (Signed)
 Palliative care reached out and patient has transitioned to comfort care.  Appreciate the staff and teams supporting the patient and his family at this time.  Nephrology will sign off.  Please do not hesitate to contact me if I can be of assistance with his care  Katheryn JAYSON Saba, MD 5:30 PM 05/01/2024

## 2024-05-01 NOTE — TOC Progression Note (Addendum)
 Transition of Care [facility]) - Progression Note    Patient Details  Name: Jake Gross MRN: [id number] Date of Birth: 01-Feb-1941  [facility]) CM/SW Contact  Dacia Capers, Nathanel, RN Phone Number: 05/01/2024, 11:36 AM  Clinical Narrative: Await outcome from PMT meeting today.[address] rep [name] aware.-they will hold patients bed-[name](dtr) aware also.  -12;50p  I will await post conversation from dtr & facility to accommodate the appropriate referral either offer hospice vs palliative @ the facility. -1:45p spoke to [name](dtr) agree to LTC w/hospice @ [name] rep [name] aware to be in place for tomorrow. MD updated.    Expected Discharge Plan: Skilled Nursing Facility Barriers to Discharge: Continued Medical Work up  Expected Discharge Plan and Services   Discharge Planning Services: CM Consult Post Acute Care Choice: Skilled Nursing Facility Living arrangements for the past 2 months: Skilled Nursing Facility                                       Social Determinants of Health (SDOH) Interventions SDOH Screenings   Food Insecurity: No Food Insecurity (04/05/2024)  Housing: Low Risk  (04/05/2024)  Transportation Needs: No Transportation Needs (04/05/2024)  Utilities: Not At Risk (04/28/2024)  Alcohol  Screen: Low Risk  (12/11/2023)  Depression (PHQ2-9): Low Risk  (02/21/2024)  Financial Resource Strain: Low Risk  (12/11/2023)  Physical Activity: Insufficiently Active (12/11/2023)  Social Connections: Moderately Isolated (04/05/2024)  Stress: No Stress Concern Present (12/11/2023)  Tobacco Use: Medium Risk (04/28/2024)  Health Literacy: Adequate Health Literacy (12/11/2023)    Readmission Risk Interventions    04/29/2024   12:53 PM 02/05/2023   11:08 AM  Readmission Risk Prevention Plan  Transportation Screening Complete Complete  Medication Review (RN Care Manager)  Complete  PCP or Specialist appointment within 3-5 days of discharge Complete Complete  HRI or  Home Care Consult Complete Complete  SW Recovery Care/Counseling Consult Complete Complete  Palliative Care Screening Complete Not Applicable  Skilled Nursing Facility Complete Complete

## 2024-05-01 NOTE — Progress Notes (Signed)
   05/01/24 1441  Spiritual Encounters  Type of Visit Initial  Care provided to: Pt and family  Conversation partners present during encounter Nurse  Referral source Family  Reason for visit Advance directives  OnCall Visit No   Responded to spiritual care consult for assistance with HCPOA. Stopped to discuss patient with nurse after seeing a care cart in front of patient's room.  Per nurse patient and family have decided to move to comfort care today. Entered room where patient was fully alert and engaging, with daughter [name] at the bedside. Greeted patient and daughter who stated they were interested in learning about a HCPOA. Provided AD education and paperwork. Patient and daughter will review and discuss with patient's son. Paperwork may not be returned until tomorrow, (per family), however patient is expected to be discharged in the morning to [facility]. Per daughter paperwork will either be returned and processed before discharging or by outside notary if they decide that is the direction they are going.  Additionally provided spiritual care and compassionate presence.  Both patient and family are content with their decision to move to comfort care and patient's placement in [facility].

## 2024-05-02 DIAGNOSIS — Z515 Encounter for palliative care: Secondary | ICD-10-CM | POA: Diagnosis not present

## 2024-05-02 DIAGNOSIS — C786 Secondary malignant neoplasm of retroperitoneum and peritoneum: Secondary | ICD-10-CM | POA: Diagnosis not present

## 2024-05-02 DIAGNOSIS — N179 Acute kidney failure, unspecified: Secondary | ICD-10-CM | POA: Diagnosis not present

## 2024-05-02 DIAGNOSIS — Z79899 Other long term (current) drug therapy: Secondary | ICD-10-CM | POA: Diagnosis not present

## 2024-05-02 LAB — GLUCOSE, CAPILLARY
Glucose-Capillary: 124 mg/dL — ABNORMAL HIGH (ref 70–99)
Glucose-Capillary: 134 mg/dL — ABNORMAL HIGH (ref 70–99)
Glucose-Capillary: 155 mg/dL — ABNORMAL HIGH (ref 70–99)

## 2024-05-02 NOTE — Progress Notes (Signed)
 Daily Progress Note   Patient Name: Jake Gross       Date: 05/02/2024 DOB: 11-14-1940  Age: 83 y.o. MRN#: [id number] Attending Physician: Rizwan, Saima, MD Primary Care Physician: Wendee Lynwood HERO, NP Admit Date: 04/28/2024 Length of Stay: 4 days  Reason for Consultation/Follow-up: Establishing goals of care  Subjective:   CC: Patient hoping to be discharged today.  No concerns.  Following up regarding complex medical decision making.  Subjective:  Reviewed EMR prior to presenting to bedside including recent documentation from nephrology, hospitalist, and TOC.  Patient noted to be returning to [facility] with hospice support today.  Presented to bedside to see patient.  Patient receiving cleaning.  No visitors present at bedside.  Again introduced myself to patient.  He noted he had no concerns today and is hoping to get discharged back to [facility] with hospice support.  Patient noted that he and family did complete HCPOA documentation.  Was able to upon this at bedside.  Noted would inform chaplain so could hopefully have notarized prior to discharge.  Patient voiced appreciation for this.  No concerns at this time.  Able to discuss care with chaplain who noted would attempt to get ACP documentation completed prior to discharge to [facility] today.  Objective:   Vital Signs:  BP 120/70 (BP Location: Right Arm)   Pulse 95   Temp 98 F (36.7 C) (Oral)   Resp 18   Ht 5' 7 (1.702 m)   Wt 80.4 kg   SpO2 97%   BMI 27.76 kg/m   Physical Exam: General: NAD, alert, chronically ill-appearing Cardiovascular: RRR Respiratory: no increased work of breathing noted, not in respiratory distress Abdomen: distended Neuro: A&Ox4, following commands easily Psych: appropriately answers all questions   Assessment & Plan:   Assessment: Patient is a 83 year old male with past medical history of hypertension, diabetes mellitus, CAD status post CABG, HFpEF, paroxysmal atrial  fibrillation, and CKD stage IV who was admitted on 04/28/2024 for lethargy.  Upon admission patient found to have large pleural effusion and ascites along with elevated creatinine.  Patient receiving management for metabolic acidosis, hyperkalemia, large right-sided pleural effusion, and AKI on CKD.  Nephrology consulted for recommendations.  Imaging obtained noted concerns for metastatic cancer and lymph nodes and peritoneal carcinomatosis.  Palliative medicine team consulted to assist with complex medical decision making.   Recommendations/Plan: # Complex medical decision making/goals of care:     - Discussed care with patient as detailed above in HPI.  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  Patient does not want to pursue aggressive medical interventions.  Plan is for patient to return to [facility] with hospice support possibly today.  Palliative medicine team available if needed.                - ACP documentation completed by chaplain and notarized.  Patient named her daughter, [name], as HCPOA and son, Alyce, as primary alternate.                  Code Status: Do not attempt resuscitation (DNR) - Comfort care   # Symptom management Patient is receiving these palliative interventions for symptom management with an intent to improve quality of life.                   -Pain/Dyspnea, acute in the setting of end-of-life care                               -  Continue IV Dilaudid  1 mg every 2 hours as needed breakthrough pain or shortness of breath                               - Continue as needed oxycodone  5 mg every 2 hours as needed p.o./sublingual                  -Anxiety/agitation, in the setting of end-of-life care                               - Continue Ativan  p.o./sublingual.5 mg every 4 hours as needed. Continue to adjust based on patient's symptom burden.                                  - Continue Haldol  0.5 mg every 4 hours as needed. Continue to adjust based on patient's symptom burden.                   -Secretions, in the setting of end-of-life care                               -Continue glycopyrrolate  0.2 mg every 4 hours as needed.                  - Diarrhea                               - Continue Imodium  2 mg every 6 hours as needed.  Monitor for constipation in setting of use of this.   # Psycho-social/Spiritual Support:  - Support System: Daughter, son - Desire for further Chaplain support:yes  # Discharge Planning: Skilled Nursing Facility with Hospice  Thank you for allowing the palliative care team to participate in the care [name].  Tinnie Radar, DO Palliative Care Provider PMT # [phone]  If patient remains symptomatic despite maximum doses, please call PMT at [phone] between 0700 and 1900. Outside of these hours, please call attending, as PMT does not have night coverage.  Personally spent 35 minutes in patient care including extensive chart review (labs, imaging, progress/consult notes, vital signs), medically appropraite exam, discussed with treatment team, education to patient, family, and staff, documenting clinical information, medication review and management, coordination of care, and available advanced directive documents.

## 2024-05-02 NOTE — Progress Notes (Signed)
   05/02/24 1047  Spiritual Encounters  Type of Visit Follow up  Care provided to: Patient  Conversation partners present during encounter Nurse;Physician  Reason for visit Advance directives  OnCall Visit No   Follow up on HCPOA as the patient is still in hospital, has not yet been transferred to [facility]. Patient and daughter left the completed HCPOA documents, secured notary and two witnesses, processed AD for daughter [name] and son Camellia to serve as [employer]

## 2024-05-02 NOTE — Plan of Care (Signed)
  Problem: Education: Goal: Ability to describe self-care measures that may prevent or decrease complications (Diabetes Survival Skills Education) will improve Outcome: Progressing Goal: Individualized Educational Video(s) Outcome: Progressing   Problem: Coping: Goal: Ability to adjust to condition or change in health will improve Outcome: Progressing   Problem: Fluid Volume: Goal: Ability to maintain a balanced intake and output will improve Outcome: Progressing   Problem: Health Behavior/Discharge Planning: Goal: Ability to identify and utilize available resources and services will improve Outcome: Progressing Goal: Ability to manage health-related needs will improve Outcome: Progressing   Problem: Metabolic: Goal: Ability to maintain appropriate glucose levels will improve Outcome: Progressing   Problem: Nutritional: Goal: Maintenance of adequate nutrition will improve Outcome: Progressing Goal: Progress toward achieving an optimal weight will improve Outcome: Progressing   Problem: Skin Integrity: Goal: Risk for impaired skin integrity will decrease Outcome: Progressing   Problem: Tissue Perfusion: Goal: Adequacy of tissue perfusion will improve Outcome: Progressing   Problem: Activity: Goal: Ability to tolerate increased activity will improve Outcome: Progressing   Problem: Clinical Measurements: Goal: Ability to maintain a body temperature in the normal range will improve Outcome: Progressing   Problem: Respiratory: Goal: Ability to maintain adequate ventilation will improve Outcome: Progressing Goal: Ability to maintain a clear airway will improve Outcome: Progressing   Problem: Education: Goal: Knowledge of General Education information will improve Description: Including pain rating scale, medication(s)/side effects and non-pharmacologic comfort measures Outcome: Progressing   Problem: Health Behavior/Discharge Planning: Goal: Ability to manage  health-related needs will improve Outcome: Progressing   Problem: Clinical Measurements: Goal: Ability to maintain clinical measurements within normal limits will improve Outcome: Progressing Goal: Will remain free from infection Outcome: Progressing Goal: Diagnostic test results will improve Outcome: Progressing Goal: Respiratory complications will improve Outcome: Progressing Goal: Cardiovascular complication will be avoided Outcome: Progressing   Problem: Activity: Goal: Risk for activity intolerance will decrease Outcome: Progressing   Problem: Nutrition: Goal: Adequate nutrition will be maintained Outcome: Progressing   Problem: Coping: Goal: Level of anxiety will decrease Outcome: Progressing   Problem: Elimination: Goal: Will not experience complications related to bowel motility Outcome: Progressing Goal: Will not experience complications related to urinary retention Outcome: Progressing   Problem: Pain Managment: Goal: General experience of comfort will improve and/or be controlled Outcome: Progressing   Problem: Safety: Goal: Ability to remain free from injury will improve Outcome: Progressing   Problem: Skin Integrity: Goal: Risk for impaired skin integrity will decrease Outcome: Progressing   Problem: Education: Goal: Knowledge of the prescribed therapeutic regimen will improve Outcome: Progressing   Problem: Coping: Goal: Ability to identify and develop effective coping behavior will improve Outcome: Progressing   Problem: Clinical Measurements: Goal: Quality of life will improve Outcome: Progressing   Problem: Respiratory: Goal: Verbalizations of increased ease of respirations will increase Outcome: Progressing   Problem: Role Relationship: Goal: Family's ability to cope with current situation will improve Outcome: Progressing Goal: Ability to verbalize concerns, feelings, and thoughts to partner or family member will improve Outcome:  Progressing   Problem: Pain Management: Goal: Satisfaction with pain management regimen will improve Outcome: Progressing

## 2024-05-02 NOTE — TOC Progression Note (Signed)
 Transition of Care [facility]) - Progression Note    Patient Details  Name: Jake Gross MRN: [id number] Date of Birth: [birth year]  Transition of [facility]) CM/SW Contact  Ronniesha Seibold, Nathanel, RN Phone Number: 05/02/2024, 3:59 PM  Clinical Narrative:    d/c Monday per facility-dtr has to pay since LTC & hospice.[address] rep [name]-LTC w/hospice.   Expected Discharge Plan: Long Term Nursing Home Barriers to Discharge: Continued Medical Work up  Expected Discharge Plan and Services   Discharge Planning Services: CM Consult Post Acute Care Choice: Skilled Nursing Facility Living arrangements for the past 2 months: Skilled Nursing Facility                                       Social Determinants of Health (SDOH) Interventions SDOH Screenings   Food Insecurity: No Food Insecurity (04/05/2024)  Housing: Low Risk  (04/05/2024)  Transportation Needs: No Transportation Needs (04/05/2024)  Utilities: Not At Risk (04/28/2024)  Alcohol  Screen: Low Risk  (12/11/2023)  Depression (PHQ2-9): Low Risk  (02/21/2024)  Financial Resource Strain: Low Risk  (12/11/2023)  Physical Activity: Insufficiently Active (12/11/2023)  Social Connections: Moderately Isolated (04/05/2024)  Stress: No Stress Concern Present (12/11/2023)  Tobacco Use: Medium Risk (04/28/2024)  Health Literacy: Adequate Health Literacy (12/11/2023)    Readmission Risk Interventions    04/29/2024   12:53 PM 02/05/2023   11:08 AM  Readmission Risk Prevention Plan  Transportation Screening Complete Complete  Medication Review (RN Care Manager)  Complete  PCP or Specialist appointment within 3-5 days of discharge Complete Complete  HRI or Home Care Consult Complete Complete  SW Recovery Care/Counseling Consult Complete Complete  Palliative Care Screening Complete Not Applicable  Skilled Nursing Facility Complete Complete

## 2024-05-03 DIAGNOSIS — N184 Chronic kidney disease, stage 4 (severe): Secondary | ICD-10-CM | POA: Diagnosis not present

## 2024-05-03 DIAGNOSIS — N179 Acute kidney failure, unspecified: Secondary | ICD-10-CM | POA: Diagnosis not present

## 2024-05-03 LAB — CULTURE, BLOOD (ROUTINE X 2)
Culture: NO GROWTH
Culture: NO GROWTH

## 2024-05-03 LAB — GLUCOSE, CAPILLARY
Glucose-Capillary: 110 mg/dL — ABNORMAL HIGH (ref 70–99)
Glucose-Capillary: 133 mg/dL — ABNORMAL HIGH (ref 70–99)

## 2024-05-03 NOTE — Plan of Care (Signed)
  Problem: Respiratory: Goal: Ability to maintain a clear airway will improve Outcome: Progressing   Problem: Coping: Goal: Ability to identify and develop effective coping behavior will improve Outcome: Progressing

## 2024-05-03 NOTE — Plan of Care (Signed)
  Problem: Education: Goal: Ability to describe self-care measures that may prevent or decrease complications (Diabetes Survival Skills Education) will improve Outcome: Progressing Goal: Individualized Educational Video(s) Outcome: Progressing   Problem: Coping: Goal: Ability to adjust to condition or change in health will improve Outcome: Progressing   Problem: Fluid Volume: Goal: Ability to maintain a balanced intake and output will improve Outcome: Progressing   Problem: Health Behavior/Discharge Planning: Goal: Ability to identify and utilize available resources and services will improve Outcome: Progressing Goal: Ability to manage health-related needs will improve Outcome: Progressing   Problem: Metabolic: Goal: Ability to maintain appropriate glucose levels will improve Outcome: Progressing   Problem: Nutritional: Goal: Maintenance of adequate nutrition will improve Outcome: Progressing Goal: Progress toward achieving an optimal weight will improve Outcome: Progressing   Problem: Skin Integrity: Goal: Risk for impaired skin integrity will decrease Outcome: Progressing   Problem: Tissue Perfusion: Goal: Adequacy of tissue perfusion will improve Outcome: Progressing   Problem: Activity: Goal: Ability to tolerate increased activity will improve Outcome: Progressing   Problem: Clinical Measurements: Goal: Ability to maintain a body temperature in the normal range will improve Outcome: Progressing   Problem: Respiratory: Goal: Ability to maintain adequate ventilation will improve Outcome: Progressing Goal: Ability to maintain a clear airway will improve Outcome: Progressing   Problem: Education: Goal: Knowledge of General Education information will improve Description: Including pain rating scale, medication(s)/side effects and non-pharmacologic comfort measures Outcome: Progressing   Problem: Health Behavior/Discharge Planning: Goal: Ability to manage  health-related needs will improve Outcome: Progressing   Problem: Clinical Measurements: Goal: Ability to maintain clinical measurements within normal limits will improve Outcome: Progressing Goal: Will remain free from infection Outcome: Progressing Goal: Diagnostic test results will improve Outcome: Progressing Goal: Respiratory complications will improve Outcome: Progressing Goal: Cardiovascular complication will be avoided Outcome: Progressing   Problem: Activity: Goal: Risk for activity intolerance will decrease Outcome: Progressing   Problem: Nutrition: Goal: Adequate nutrition will be maintained Outcome: Progressing   Problem: Coping: Goal: Level of anxiety will decrease Outcome: Progressing   Problem: Elimination: Goal: Will not experience complications related to bowel motility Outcome: Progressing Goal: Will not experience complications related to urinary retention Outcome: Progressing   Problem: Pain Managment: Goal: General experience of comfort will improve and/or be controlled Outcome: Progressing   Problem: Safety: Goal: Ability to remain free from injury will improve Outcome: Progressing   Problem: Skin Integrity: Goal: Risk for impaired skin integrity will decrease Outcome: Progressing   Problem: Education: Goal: Knowledge of the prescribed therapeutic regimen will improve Outcome: Progressing   Problem: Coping: Goal: Ability to identify and develop effective coping behavior will improve Outcome: Progressing   Problem: Clinical Measurements: Goal: Quality of life will improve Outcome: Progressing   Problem: Respiratory: Goal: Verbalizations of increased ease of respirations will increase Outcome: Progressing   Problem: Role Relationship: Goal: Family's ability to cope with current situation will improve Outcome: Progressing Goal: Ability to verbalize concerns, feelings, and thoughts to partner or family member will improve Outcome:  Progressing   Problem: Pain Management: Goal: Satisfaction with pain management regimen will improve Outcome: Progressing

## 2024-05-03 NOTE — Progress Notes (Addendum)
 [facility] Progress Note  Patient: Jake Gross     FMW:[id number]  DOA: 04/28/2024   PCP: Wendee Lynwood HERO, NP       Brief hospital course: This is a 83 year old male with hypertension, coronary artery disease status post CABG, chronic HFpEF, paroxysmal atrial fibrillation, chronic kidney disease stage IV who presented to the hospital for lethargy.  He was found to have a large pleural effusion and ascites and enlarged abdominal lymph nodes.  The patient was also noted to have a creatinine of 4.96 which was elevated from his baseline of 2.95 and a potassium of 6.1.  He was treated with IV fluids and Lokelma .  Subjective:  Refusing blood draws.   Assessment and Plan: Principal Problem:   Acute renal failure 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 (HCC)   Metabolic acidosis - Appreciate nephrology assistance - Continue IV fluids - I have spoken with the patient's daughter regarding his oral intake at SNF and it appears that the patient has had poor oral intake as outpatient-continue to hold diuretics and continue IV fluids per nephrology -The patient also is found to have bladder outlet obstruction and has greater than 600 cc on bladder scan-Foley catheter ordered for  and O and also because he is incontinent-patient declined Foley catheter - Patient declined further blood draws - Upon further conversation between myself the patient and his daughter, DO NOT RESUSCITATE order was placed on 7/16 - Palliative care consulted and patient has been transition to comfort care today  Active Problems: Hyperkalemia - Improved from 6.1-5.5  Large right-sided pleural effusion Ascites with lymphadenopathy highly suggestive of metastatic cancer - Patient has a history of an adenomatous rectal polyp that was resected in 2016 - According to my discussion with the patient's daughter, the patient opted not to continue surveillance colonoscopies as outpatient and at this time, would  not want chemotherapy -Has been transitioned to comfort care  Paroxysmal atrial fibrillation (HCC) History of CVA - Can continue Eliquis  unless he refuses to take it      Code Status: Do not attempt resuscitation (DNR) - Comfort care Total time on patient care: 30 minutes DVT prophylaxis: Eliquis   Objective:   Vitals:   05/01/24 1245 05/02/24 0440 05/02/24 1244 05/03/24 0549  BP: 107/67 120/70 100/66 121/71  Pulse: 92 95 96   Resp: 18  20 (!) 22  Temp: 97.9 F (36.6 C) 98 F (36.7 C) (!) 97.3 F (36.3 C) (!) 97.5 F (36.4 C)  TempSrc: Oral Oral  Oral  SpO2: 97% 97% 97% 96%  Weight:      Height:       Filed Weights   04/29/24 0442 04/30/24 0511 05/01/24 0442  Weight: 78.8 kg 80.4 kg 80.4 kg   Exam: General exam: Appears comfortable-awake alert and oriented x 3 HEENT: oral mucosa moist Respiratory system: Clear to auscultation.  Cardiovascular system: S1 & S2 heard  Gastrointestinal system: Abdomen soft, non-tender, nondistended. Normal bowel sounds   Extremities: No cyanosis, clubbing or edema Psychiatry:  Mood & affect appropriate.    CBC: Recent Labs  Lab 04/28/24 1726 04/29/24 0847 04/30/24 0502 05/01/24 0530  WBC 14.3* 12.8* 12.7* 13.0*  NEUTROABS 12.1*  --   --   --   HGB 12.0* 11.0* 10.6* 11.5*  HCT 38.2* 35.6* 33.4* 36.7*  MCV 82.2 83.8 81.7 82.8  PLT 107* 83* 94* 115*   Basic Metabolic Panel: Recent Labs  Lab 04/28/24 1726 04/28/24 2146 04/29/24 0847 04/29/24 1251 04/29/24 2040  04/30/24 0016 04/30/24 0502 04/30/24 1208 05/01/24 0530  NA 137  --  136  --   --   --  134*  --  135  K 6.1*   < > 5.7*   < > 5.6* 5.2* 4.8 5.5* 4.7  CL 108  --  105  --   --   --  99  --  101  CO2 14*  --  14*  --   --   --  19*  --  17*  GLUCOSE 174*  --  171*  --   --   --  116*  --  154*  BUN 110*  --  109*  --   --   --  100*  --  112*  CREATININE 4.96*  --  4.69*  --   --   --  4.98*  --  4.84*  CALCIUM  8.1*  --  7.6*  --   --   --  7.4*  --  7.3*   < >  = values in this interval not displayed.     Scheduled Meds:  apixaban   2.5 mg Oral BID   Chlorhexidine  Gluconate Cloth  6 each Topical Daily   Gerhardt's butt cream   Topical TID   sodium chloride  flush  3 mL Intravenous Q12H    Imaging and lab data personally reviewed   Author: Pixie Burgener  05/03/2024 2:01 PM  To contact [facility]>   Check the care team in [facility] and look for the attending/consulting [facility] provider listed  Log into [email or web address] and use [place]'s universal password   Go to> [facility]  and find provider  If you still have difficulty reaching the provider, please page the [facility] (Director on Call) for the Hospitalists listed on amion

## 2024-05-04 DIAGNOSIS — Z515 Encounter for palliative care: Secondary | ICD-10-CM

## 2024-05-04 DIAGNOSIS — C786 Secondary malignant neoplasm of retroperitoneum and peritoneum: Secondary | ICD-10-CM | POA: Diagnosis not present

## 2024-05-04 DIAGNOSIS — Z66 Do not resuscitate: Secondary | ICD-10-CM

## 2024-05-04 DIAGNOSIS — Z79899 Other long term (current) drug therapy: Secondary | ICD-10-CM | POA: Diagnosis not present

## 2024-05-04 DIAGNOSIS — N179 Acute kidney failure, unspecified: Secondary | ICD-10-CM | POA: Diagnosis not present

## 2024-05-04 DIAGNOSIS — R5381 Other malaise: Secondary | ICD-10-CM

## 2024-05-04 DIAGNOSIS — Z7189 Other specified counseling: Secondary | ICD-10-CM

## 2024-05-04 DIAGNOSIS — Z8673 Personal history of transient ischemic attack (TIA), and cerebral infarction without residual deficits: Secondary | ICD-10-CM | POA: Diagnosis not present

## 2024-05-04 DIAGNOSIS — J189 Pneumonia, unspecified organism: Secondary | ICD-10-CM

## 2024-05-04 DIAGNOSIS — R638 Other symptoms and signs concerning food and fluid intake: Secondary | ICD-10-CM

## 2024-05-04 LAB — GLUCOSE, CAPILLARY
Glucose-Capillary: 120 mg/dL — ABNORMAL HIGH (ref 70–99)
Glucose-Capillary: 147 mg/dL — ABNORMAL HIGH (ref 70–99)
Glucose-Capillary: 157 mg/dL — ABNORMAL HIGH (ref 70–99)

## 2024-05-04 NOTE — Progress Notes (Signed)
 Daily Progress Note   Patient Name: Jake Gross       Date: 05/04/2024 DOB: Jun 20, 1941  Age: 83 y.o. MRN#: [id number] Attending Physician: Rizwan, Saima, MD Primary Care Physician: Wendee Lynwood HERO, NP Admit Date: 04/28/2024 Length of Stay: 6 days  Reason for Consultation/Follow-up: Establishing goals of care  Subjective:   CC: Patient awaiting discharge to long-term care with hospice.  Following up regarding complex medical decision making.  Subjective:  Reviewed EMR prior to presenting to bedside.  At home of EMR review in past 24 hours patient has required as needed Compazine  5 mg x 1 dose.  I presented to bedside to see patient.  No visitors present.  Patient laying comfortably in bed.  Again introduced myself as a member of the palliative medicine team.  Inquired about symptom management.  Patient denied any pain or nausea at this time.  Patient only requested that his pillow be fixed so assisted with that.  All questions answered at that time.  Patient waiting to go to long-term care with hospice support.  Objective:   Vital Signs:  BP 107/71 (BP Location: Right Arm)   Pulse 93   Temp 97.9 F (36.6 C) (Oral)   Resp 20   Ht 5' 7 (1.702 m)   Wt 80.4 kg   SpO2 100%   BMI 27.76 kg/m   Physical Exam: General: NAD, alert, chronically ill-appearing Cardiovascular: RRR Respiratory: no increased work of breathing noted, not in respiratory distress Abdomen: distended Neuro: Awake, interactive, following commands easily Psych: appropriately answers all questions  Assessment & Plan:   Assessment: Patient is a 83 year old male with past medical history of hypertension, diabetes mellitus, CAD status post CABG, HFpEF, paroxysmal atrial fibrillation, and CKD stage IV who was admitted on 04/28/2024 for lethargy.  Upon admission patient found to have large pleural effusion and ascites along with elevated creatinine.  Patient receiving management for metabolic acidosis,  hyperkalemia, large right-sided pleural effusion, and AKI on CKD.  Nephrology consulted for recommendations.  Imaging obtained noted concerns for metastatic cancer and lymph nodes and peritoneal carcinomatosis.  Palliative medicine team consulted to assist with complex medical decision making.   Recommendations/Plan: # Complex medical decision making/goals of care:     - Discussed care with patient as detailed above in HPI.  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  Patient did not want to pursue aggressive medical interventions.  Plan is for patient [facility] with hosp[facility] support possibly tomorrow.  Palliative medicine team available if needed.                - ACP documentation completed by chaplain and notarized.  Patient named her daughter, [name], as HCPOA and son, Alyce, as primary alternate.                  Code Status: Do not attempt resuscitation (DNR) - Comfort care   # Symptom management Patient is receiving these palliative interventions for symptom management with an intent to improve quality of life.                   -Pain/Dyspnea, acute in the setting of end-of-life care                               - Continue IV Dilaudid  1 mg every 2 hours as needed  breakthrough pain or shortness of breath                               - Continue as needed oxycodone  5 mg every 2 hours as needed p.o./sublingual                  -Anxiety/agitation, in the setting of end-of-life care                               - Continue Ativan  p.o./sublingual.5 mg every 4 hours as needed. Continue to adjust based on patient's symptom burden.                                 - Continue Haldol  0.5 mg every 4 hours as needed. Continue to adjust based on patient's symptom burden.                   -Secretions, in the setting of end-of-life care                               -Continue  glycopyrrolate  0.2 mg every 4 hours as needed.                  - Diarrhea                               - Continue Imodium  2 mg every 6 hours as needed.  Monitor for constipation in setting of use of this.   # Psycho-social/Spiritual Support:  - Support System: Daughter, son - Desire for further Chaplain support:yes  # Discharge Planning: Skilled Nursing Facility with Hospice  Thank you for allowing the palliative care team to participate in the care [name].  Tinnie Radar, DO Palliative Care Provider PMT # [phone]  If patient remains symptomatic despite maximum doses, please call PMT at [phone] between 0700 and 1900. Outside of these hours, please call attending, as PMT does not have night coverage.

## 2024-05-04 NOTE — Progress Notes (Signed)
 [facility] Progress Note  Patient: Jake Gross     FMW:[id number]  DOA: 04/28/2024   PCP: Wendee Lynwood HERO, NP       Brief hospital course: This is a 83 year old male with hypertension, coronary artery disease status post CABG, chronic HFpEF, paroxysmal atrial fibrillation, chronic kidney disease stage IV who presented to the hospital for lethargy.  He was found to have a large pleural effusion and ascites and enlarged abdominal lymph nodes.  The patient was also noted to have a creatinine of 4.96 which was elevated from his baseline of 2.95 and a potassium of 6.1.  He was treated with IV fluids and Lokelma .  Subjective:  No complaints today.   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 in 2016 - According to my discussion with the patient's daughter, the patient opted not to continue surveillance colonoscopies as outpatient and at this time, would not want chemotherapy   - transitioned to comfort care on 05/01/20- appreciate palliative care assitance  Active Problems: Hyperkalemia - Improved from 6.1 to 4.7  Paroxysmal atrial fibrillation (HCC) History of CVA - Can continue Eliquis  unless he refuses to take it      Code Status: Do not attempt resuscitation (DNR) - Comfort care Total time on patient care: 30 minutes DVT prophylaxis: Eliquis   Objective:   Vitals:   05/02/24 1244 05/03/24 0549 05/03/24 2002 05/04/24 0435  BP: 100/66 121/71 114/62 107/71  Pulse: 96  95 93  Resp: 20 (!) 22 20 20   Temp: (!) 97.3 F (36.3 C) (!) 97.5 F (36.4 C) 98.5 F (36.9 C) 97.9 F (36.6 C)  TempSrc:  Oral  Oral  SpO2: 97% 96% 96% 100%  Weight:      Height:       Filed Weights   04/29/24 0442 04/30/24  0511 05/01/24 0442  Weight: 78.8 kg 80.4 kg 80.4 kg   Exam: General exam: Appears comfortable  HEENT: oral mucosa moist Respiratory system: Clear to auscultation.  Cardiovascular system: S1 & S2 heard  Gastrointestinal system: Abdomen soft, distended, Normal bowel sounds   Extremities: No cyanosis, clubbing or edema Psychiatry:  Mood & affect appropriate.   CBC: Recent Labs  Lab 04/28/24 1726 04/29/24 0847 04/30/24 0502 05/01/24 0530  WBC 14.3* 12.8* 12.7* 13.0*  NEUTROABS 12.1*  --   --   --   HGB 12.0* 11.0* 10.6* 11.5*  HCT 38.2* 35.6* 33.4* 36.7*  MCV 82.2 83.8 81.7 82.8  PLT 107* 83* 94* 115*   Basic Metabolic Panel: Recent Labs  Lab 04/28/24 1726 04/28/24 2146 04/29/24 0847 04/29/24 1251 04/29/24 2040 04/30/24 0016 04/30/24 0502 04/30/24 1208 05/01/24 0530  NA 137  --  136  --   --   --  134*  --  135  K 6.1*   < > 5.7*   < > 5.6* 5.2* 4.8 5.5* 4.7  CL 108  --  105  --   --   --  99  --  101  CO2 14*  --  14*  --   --   --  19*  --  17*  GLUCOSE 174*  --  171*  --   --   --  116*  --  154*  BUN 110*  --  109*  --   --   --  100*  --  112*  CREATININE 4.96*  --  4.69*  --   --   --  4.98*  --  4.84*  CALCIUM  8.1*  --  7.6*  --   --   --  7.4*  --  7.3*   < > = values in this interval not displayed.     Scheduled Meds:  apixaban   2.5 mg Oral BID   Chlorhexidine  Gluconate Cloth  6 each Topical Daily   Gerhardt's butt cream   Topical TID   sodium chloride  flush  3 mL Intravenous Q12H    Imaging and lab data personally reviewed   Author: Tonny Isensee  05/04/2024 2:37 PM  To contact [facility]>   Check the care team in [facility] and look for the attending/consulting [facility] provider listed  Log into [email or web address] and use [place]'s universal password   Go to> [facility]  and find provider  If you still have difficulty reaching the provider, please page the [facility] (Director on Call) for the Hospitalists listed on amion

## 2024-05-04 NOTE — Plan of Care (Signed)
  Problem: Coping: Goal: Ability to adjust to condition or change in health will improve Outcome: Progressing   Problem: Nutritional: Goal: Maintenance of adequate nutrition will improve Outcome: Progressing   Problem: Respiratory: Goal: Ability to maintain adequate ventilation will improve Outcome: Progressing

## 2024-05-05 ENCOUNTER — Inpatient Hospital Stay: Admitting: Nurse Practitioner

## 2024-05-05 DIAGNOSIS — N179 Acute kidney failure, unspecified: Secondary | ICD-10-CM | POA: Diagnosis not present

## 2024-05-05 LAB — GLUCOSE, CAPILLARY
Glucose-Capillary: 172 mg/dL — ABNORMAL HIGH (ref 70–99)
Glucose-Capillary: 164 mg/dL — ABNORMAL HIGH (ref 70–99)
Glucose-Capillary: 172 mg/dL — ABNORMAL HIGH (ref 70–99)

## 2024-05-05 MED ORDER — GLYCOPYRROLATE 1 MG PO TABS: 1.0000 mg | ORAL_TABLET | ORAL | Status: DC | PRN

## 2024-05-05 MED ORDER — LORAZEPAM 0.5 MG PO TABS: 0.5000 mg | ORAL_TABLET | ORAL | 0 refills | Status: DC | PRN

## 2024-05-05 MED ORDER — LOPERAMIDE HCL 2 MG PO CAPS: 2.0000 mg | ORAL_CAPSULE | Freq: Four times a day (QID) | ORAL | 0 refills | Status: DC | PRN

## 2024-05-05 MED ORDER — OXYCODONE HCL 20 MG/ML PO CONC: 5.0000 mg | ORAL | 0 refills | Status: DC | PRN

## 2024-05-05 NOTE — Discharge Summary (Signed)
 Physician Discharge Summary  Jake Gross FMW:[id number] DOB: 09-30-1941 DOA: 04/28/2024  PCP: Wendee Lynwood HERO, NP  Admit date: 04/28/2024 Discharge date: 05/05/2024  Recommendations for Outpatient Follow-up:  Will be discharging with hospice  Consults:  Palliative care     Discharge Diagnoses:   Principal Problem:   Acute renal failure 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 (HCC) Active Problems:   Paroxysmal atrial fibrillation (HCC)   History of CVA (cerebrovascular accident)   CAD (coronary artery disease)   Hyperkalemia   Metabolic acidosis   Type 2 diabetes mellitus with diabetic chronic kidney disease (HCC)   Diastolic CHF, chronic (HCC)   Pleural effusion on right   Thrombocytopenia (HCC)   High risk medication use   Pneumonia of right lung due to infectious organism   Peritoneal carcinomatosis (HCC)   Decreased oral intake   DNR (do not resuscitate)   ACP (advance care planning)   Counseling and coordination of care   Debility   Medication management   Palliative care encounter   Brief hospital course: This is a 83 year old male with hypertension, coronary artery disease status post CABG, chronic HFpEF, paroxysmal atrial fibrillation, chronic kidney disease stage IV who presented to the hospital for lethargy.  He was found to have a large pleural effusion and ascites and enlarged abdominal lymph nodes.  The patient was also noted to have a creatinine of 4.96 which was elevated from his baseline of 2.95 and a potassium of 6.1.  He was treated with IV fluids and Lokelma .  In ED, CT scan of chest performed and reveals> 1. Large right pleural effusion with right lower lobe compressive atelectasis. 2. Moderate volume ascites with nodularity in the bilateral upper quadrants, suspicious for peritoneal disease. 3. Increased size of upper abdominal lymph nodes measuring up to 10 mm, previously 5 mm, suspicious for metastatic disease. 4. New comminuted left  clavicle fracture.  During the hospital stay, the patient was refusing treatment. He continued to have poor oral intake as  well. Noted high suspicion for metastatic cancer on imaging and discussed with patient and daughter. Upon further discussions with the patient and her daughter, it wad determined that he did not want further interventions or work up. He was transitioned to DNR/DNR and then comfort care.  Transitioned to comfort care on 05/01/20- appreciate palliative care assitance  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 in 2016 - According to my discussion with the patient's daughter, the patient opted not to continue surveillance colonoscopies as outpatient    Active Problems: Hyperkalemia - Improved from 6.1 to 4.7   Paroxysmal atrial fibrillation (HCC) History of CVA - Can continue Eliquis  unless he refuses to take it    Left clavicular fracture - new finding on imaging - no complaints of pain            Discharge Instructions   Allergies as of 05/05/2024   No Known Allergies      Medication List     STOP taking these medications    dapagliflozin  propanediol 10 MG Tabs tablet Commonly known as: FARXIGA    doxycycline  100 MG capsule Commonly known as: VIBRAMYCIN    rosuvastatin  20 MG tablet Commonly known as: CRESTOR    torsemide  20 MG tablet Commonly known as: DEMADEX   TAKE these medications    acetaminophen  325 MG tablet Commonly known as: TYLENOL  Take 2 tablets (650 mg total) by mouth every 6 (six) hours as needed for mild pain (pain score 1-3) (or Fever >/= 101).   apixaban  2.5 MG Tabs tablet Commonly known as: Eliquis  Take 1 tablet (2.5 mg total) by mouth 2 (two) times  daily.   Boost Glucose Control Liqd Take 1 Bottle by mouth 2 (two) times daily at 10 am and 4 pm.   glycopyrrolate  1 MG tablet Commonly known as: ROBINUL  Take 1 tablet (1 mg total) by mouth every 4 (four) hours as needed (excessive secretions).   isosorbide  mononitrate 60 MG 24 hr tablet Commonly known as: IMDUR  Take 1.5 tablets (90 mg total) by mouth daily.   loperamide  2 MG capsule Commonly known as: IMODIUM  Take 1 capsule (2 mg total) by mouth every 6 (six) hours as needed for diarrhea or loose stools.   LORazepam  0.5 MG tablet Commonly known as: ATIVAN  Take 1 tablet (0.5 mg total) by mouth every 4 (four) hours as needed for anxiety.   ondansetron  4 MG tablet Commonly known as: ZOFRAN  Take 4 mg by mouth every 6 (six) hours as needed for nausea or vomiting.   oxyCODONE  20 MG/ML concentrated solution Commonly known as: ROXICODONE  INTENSOL Take 0.3 mLs (6 mg total) by mouth every 2 (two) hours as needed for moderate pain (pain score 4-6) (or dyspnea).            The results of significant diagnostics from this hospitalization (including imaging, microbiology, ancillary and laboratory) are listed below for reference.    CT CHEST WO CONTRAST Result Date: 04/29/2024 CLINICAL DATA:  Right pleural effusion.  * Tracking Code: BO * EXAM: CT CHEST WITHOUT CONTRAST TECHNIQUE: Multidetector CT imaging of the chest was performed following the standard protocol without IV contrast. RADIATION DOSE REDUCTION: This exam was performed according to the departmental dose-optimization program which includes automated exposure control, adjustment of the mA and/or kV according to patient size and/or use of iterative reconstruction technique. COMPARISON:  Chest radiograph dated 04/28/2024, CT chest dated 09/27/2022 FINDINGS: Cardiovascular: Normal heart size. No significant pericardial fluid/thickening. Ascending thoracic aorta measures 4.3 cm. Coronary artery calcifications and aortic  atherosclerosis. Mediastinum/Nodes: Imaged thyroid  gland without nodules meeting criteria for imaging follow-up by size. Normal esophagus. No pathologically enlarged axillary, supraclavicular, mediastinal, or hilar lymph nodes. Lungs/Pleura: The central airways are patent. Right lower lobe compressive atelectasis. No pneumothorax. Large right pleural effusion. Upper abdomen: Cholelithiasis. Moderate volume ascites with nodularity in the bilateral upper quadrants. Increased size of upper abdominal lymph nodes measuring up to 10 mm (2:143), previously 5 mm. Right periphrenic lymph node measures 12 mm (2:115). Musculoskeletal: New comminuted left clavicle fracture. Median sternotomy wires are nondisplaced. Multilevel degenerative changes of the thoracic spine. Partially imaged lipoma anterior to the left shoulder (2:1). IMPRESSION: 1. Large right pleural effusion with right lower lobe compressive atelectasis. 2. Moderate volume ascites with nodularity in the bilateral upper quadrants, suspicious for peritoneal disease. 3. Increased size of upper abdominal lymph nodes measuring up to 10 mm, previously 5 mm, suspicious for metastatic disease. 4. New comminuted left clavicle fracture. 5. Ascending thoracic aorta measures 4.3 cm. Recommend annual imaging followup by CTA or MRA. This recommendation follows 2010 ACCF/AHA/AATS/ACR/ASA/SCA/SCAI/SIR/STS/SVM Guidelines for the Diagnosis and Management of Patients with Thoracic Aortic Disease. Circulation. 2010; 121: Z733-z630. Aortic aneurysm NOS (ICD10-I71.9) 6. Aortic Atherosclerosis (ICD10-I70.0). Coronary artery calcifications. Assessment for potential risk factor modification, dietary therapy or pharmacologic  therapy may be warranted, if clinically indicated. Electronically Signed   By: Limin  Xu M.D.   On: 04/29/2024 10:39   US  RENAL Result Date: 04/28/2024 CLINICAL DATA:  Acute on chronic renal failure. EXAM: RENAL / URINARY TRACT ULTRASOUND COMPLETE COMPARISON:  Renal  ultrasound 04/05/2024. FINDINGS: Right Kidney: Renal measurements: 10.6 x 5.0 x 5.4 cm = volume: 147 mL. Echogenicity is increased. No mass or hydronephrosis visualized. Left Kidney: Renal measurements: 12.1 x 5.8 x 4.4 cm = volume: 161 mL. Echogenicity is increased. No mass or hydronephrosis visualized. Bladder: Appears normal for degree of bladder distention. Other: None. IMPRESSION: 1. Increased echogenicity of the kidneys as can be seen in medical renal disease. 2. No hydronephrosis. Electronically Signed   By: Greig Pique M.D.   On: 04/28/2024 23:07   DG Chest Port 1 View Result Date: 04/28/2024 CLINICAL DATA:  Sepsis, fatigue. EXAM: PORTABLE CHEST 1 VIEW COMPARISON:  April 04, 2024. FINDINGS: Stable cardiomediastinal silhouette. Status post coronary artery bypass graft. Left lung is clear. Mild diffuse opacification of right lung is noted most consistent with layering pleural effusion and probable associated subsegmental atelectasis. Old left clavicular fracture is again noted. IMPRESSION: Mild diffuse opacification of right lung is noted consistent with layering pleural effusion and probable associated subsegmental atelectasis. Electronically Signed   By: Lynwood Landy Raddle M.D.   On: 04/28/2024 17:57   US  RENAL Result Date: 04/05/2024 CLINICAL DATA:  Acute kidney insufficiency EXAM: RENAL / URINARY TRACT ULTRASOUND COMPLETE COMPARISON:  Ultrasound 01/24/2023.  Noncontrast CT 02/01/2023 FINDINGS: Right Kidney: Renal measurements: 11.7 x 6.2 x 5.2 cm = volume: 198.7 mL. Echogenic parenchyma. Mild parenchymal global atrophy. No collecting system dilatation or perinephric fluid. Left Kidney: Renal measurements: 11.8 x 6.2 x 5.8 cm = volume: 222.3 mL. No collecting system dilatation or perinephric fluid. Global mild parenchymal atrophy. Small anechoic focus exophytic from the midportion of the kidney measures 12 mm. Bladder: Underdistended bladder. Urinary bladder wall thickening with some trabeculation.  Enlarged prostate. Prostate measures 7.1 x 4.7 x 7.0 cm. Other: None. IMPRESSION: Mild bilateral renal atrophy. Right kidney appears slightly echogenic. No collecting system dilatation. Enlarged prostate.  Please correlate with any history and prior PSA Electronically Signed   By: Ranell Bring M.D.   On: 04/05/2024 11:13   Labs:   Basic Metabolic Panel: Recent Labs  Lab 04/28/24 1726 04/28/24 2146 04/29/24 0847 04/29/24 1251 04/29/24 2040 04/30/24 0016 04/30/24 0502 04/30/24 1208 05/01/24 0530  NA 137  --  136  --   --   --  134*  --  135  K 6.1*   < > 5.7*   < > 5.6* 5.2* 4.8 5.5* 4.7  CL 108  --  105  --   --   --  99  --  101  CO2 14*  --  14*  --   --   --  19*  --  17*  GLUCOSE 174*  --  171*  --   --   --  116*  --  154*  BUN 110*  --  109*  --   --   --  100*  --  112*  CREATININE 4.96*  --  4.69*  --   --   --  4.98*  --  4.84*  CALCIUM  8.1*  --  7.6*  --   --   --  7.4*  --  7.3*   < > = values in this interval not displayed.  CBC: Recent Labs  Lab 04/28/24 1726 04/29/24 0847 04/30/24 0502 05/01/24 0530  WBC 14.3* 12.8* 12.7* 13.0*  NEUTROABS 12.1*  --   --   --   HGB 12.0* 11.0* 10.6* 11.5*  HCT 38.2* 35.6* 33.4* 36.7*  MCV 82.2 83.8 81.7 82.8  PLT 107* 83* 94* 115*         SIGNED:   True Atlas, MD  [facility] 05/05/2024, 8:54 AM Time taking on discharge: 50 minutes

## 2024-05-05 NOTE — NC FL2 (Signed)
 [place]  MEDICAID [id number] LEVEL OF CARE FORM     IDENTIFICATION  Patient Name: Jake Gross Birthdate: Dec 19, 1940 Sex: male Admission Date (Current Location): 04/28/2024  [facility] and [place] Number:  Producer, television/film/video and Address:  [facility],  [address], [place] [address]      Provider Number: [id number]  Attending Physician Name and Address:  Earley Saucer, MD  Relative Name and Phone Number:  Leotha, Voeltz (Daughter)  [phone]    Current Level of Care: Hospital Recommended Level of Care: Assisted Living Facility Prior Approval Number:    Date Approved/Denied:   PASRR Number: [id number]  Discharge Plan: Other (Comment) [facility])    Current Diagnoses: Patient Active Problem List   Diagnosis Date Noted   High risk medication use 05/01/2024   Pneumonia of right lung due to infectious organism 05/01/2024   Peritoneal carcinomatosis (HCC) 05/01/2024   Decreased oral intake 05/01/2024   DNR (do not resuscitate) 05/01/2024   ACP (advance care planning) 05/01/2024   Counseling and coordination of care 05/01/2024   Debility 05/01/2024   Medication management 05/01/2024   Palliative care encounter 05/01/2024   Pleural effusion on right 04/28/2024   Thrombocytopenia (HCC) 04/28/2024   Weakness 04/04/2024   Chronic a-fib (HCC) 03/22/2024   Diastolic CHF, chronic (HCC) 03/22/2024   Generalized weakness 03/20/2024   Hypoglycemia 03/16/2024   Right hip pain 10/12/2023   Enlarged and hypertrophic nails 07/13/2023   Hospital discharge follow-up 03/05/2023   Acute-on-chronic kidney injury (HCC) 02/01/2023   AKI (acute kidney injury) (HCC) 01/25/2023   Acute renal failure 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 (HCC) 09/28/2022   Elevated troponin 09/28/2022   Metabolic acidosis 09/28/2022   Prolonged QT interval 09/28/2022   Medicare annual wellness visit, subsequent 10/29/2021   Abnormal SPEP 09/12/2021   Dyspnea 06/27/2021    Abscess 06/27/2021   History of anemia 06/27/2021   Nocturia 06/27/2021   Benign hypertensive kidney disease with chronic kidney disease 12/22/2019   Proteinuria 12/22/2019   Stage 3b chronic kidney disease (HCC) 12/22/2019   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   Hypokalemia 12/22/2019   History of GI bleed 09/29/2019   CAD (coronary artery disease) 09/29/2019   AVM (arteriovenous malformation) of small bowel, 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   Iron deficiency anemia due to chronic blood loss    Chronic anticoagulation    History of CVA (cerebrovascular accident) 08/25/2019   (HFpEF) heart failure with preserved ejection fraction (HCC) 12/17/2018   Paroxysmal atrial fibrillation (HCC) 12/17/2018   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   Unstable angina (HCC)    Hyperkalemia 03/25/2018   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   HLD (hyperlipidemia) 09/14/2015   Essential hypertension    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    Arthritis 09/16/2012   Diabetes mellitus type 2, insulin  dependent (HCC) 08/18/2011    Orientation RESPIRATION BLADDER Height & Weight     Self, Time  Normal Continent Weight: 177 lb 4 oz (80.4 kg) Height:  5' 7 (170.2 cm)  BEHAVIORAL SYMPTOMS/MOOD NEUROLOGICAL BOWEL NUTRITION STATUS      Incontinent Diet (Soft diet)  AMBULATORY STATUS COMMUNICATION OF NEEDS Skin   Limited Assist Verbally Other (Comment) (Wound 04/28/24 2130 Pressure Injury Buttocks Right;Left;Upper;Lower Stage 1, Wound 04/30/24 1924 Other (Comment) Knee Anterior;Left;Distal, and Wound 04/30/24 1925 Other (Comment) Knee Anterior;Right;Distal)  Personal Care Assistance Level of Assistance  Bathing, Feeding, Dressing Bathing Assistance: Maximum assistance Feeding assistance: Limited assistance Dressing Assistance: Maximum assistance     Functional Limitations Info  Sight, Hearing, Speech Sight  Info: Impaired Hearing Info: Impaired Speech Info: Adequate    SPECIAL CARE FACTORS FREQUENCY                       Contractures Contractures Info: Not present    Additional Factors Info  Code Status, Allergies Code Status Info: DNR Allergies Info: NKA           Current Medications (05/05/2024):  This is the current hospital active medication list Current Facility-Administered Medications  Medication Dose Route Frequency Provider Last Rate Last Admin   acetaminophen  (TYLENOL ) tablet 650 mg  650 mg Oral Q6H PRN Opyd, Evalene RAMAN, MD       Or   acetaminophen  (TYLENOL ) suppository 650 mg  650 mg Rectal Q6H PRN Opyd, Timothy S, MD       antiseptic oral rinse (BIOTENE) solution 15 mL  15 mL Topical PRN Mims, Tinnie ORN, DO       apixaban  (ELIQUIS ) tablet 2.5 mg  2.5 mg Oral BID Cindy Garnette POUR, MD   2.5 mg at 05/04/24 2155   artificial tears ophthalmic solution 1 drop  1 drop Both Eyes QID PRN Clayton Tinnie ORN, DO       Chlorhexidine  Gluconate Cloth 2 % PADS 6 each  6 each Topical Daily Jerrye Katheryn BROCKS, MD   6 each at 05/03/24 9080   Gerhardt's butt cream   Topical TID Rizwan, Saima, MD   Given at 05/04/24 2156   glycopyrrolate  (ROBINUL ) tablet 1 mg  1 mg Oral Q4H PRN Clayton Tinnie ORN, DO       Or   glycopyrrolate  (ROBINUL ) injection 0.2 mg  0.2 mg Subcutaneous Q4H PRN Mims, Lauren W, DO       Or   glycopyrrolate  (ROBINUL ) injection 0.2 mg  0.2 mg Intravenous Q4H PRN Mims, Lauren W, DO       haloperidol  (HALDOL ) tablet 0.5 mg  0.5 mg Oral Q4H PRN Mims, Lauren W, DO       Or   haloperidol  (HALDOL ) 2 MG/ML solution 0.5 mg  0.5 mg Sublingual Q4H PRN Mims, Lauren W, DO       Or   haloperidol  lactate (HALDOL ) injection 0.5 mg  0.5 mg Intravenous Q4H PRN Mims, Lauren W, DO       HYDROmorphone  (DILAUDID ) injection 1 mg  1 mg Intravenous Q2H PRN Mims, Lauren W, DO       loperamide  (IMODIUM ) capsule 2 mg  2 mg Oral Q6H PRN Clayton Tinnie ORN, DO   2 mg at 05/04/24 1015   LORazepam  (ATIVAN )  tablet 0.5 mg  0.5 mg Oral Q4H PRN Mims, Lauren W, DO   0.5 mg at 05/02/24 0344   Or   LORazepam  (ATIVAN ) 2 MG/ML concentrated solution 0.5 mg  0.5 mg Sublingual Q4H PRN Mims, Lauren W, DO       Or   LORazepam  (ATIVAN ) 2 MG/ML concentrated solution 0.5 mg  0.5 mg Oral Q4H PRN Mims, Lauren W, DO       oxyCODONE  (ROXICODONE  INTENSOL) 20 MG/ML concentrated solution 5 mg  5 mg Oral Q2H PRN Mims, Lauren W, DO   5 mg at 05/05/24 0045   Or   oxyCODONE  (ROXICODONE  INTENSOL) 20 MG/ML concentrated solution 5 mg  5 mg Sublingual Q2H PRN Mims,  Lauren W, DO       prochlorperazine  (COMPAZINE ) injection 5 mg  5 mg Intravenous Q6H PRN Opyd, Timothy S, MD   5 mg at 05/04/24 0737   senna (SENOKOT) tablet 8.6 mg  1 tablet Oral Daily PRN Opyd, Timothy S, MD       sodium chloride  flush (NS) 0.9 % injection 3 mL  3 mL Intravenous Q12H Opyd, Timothy S, MD   3 mL at 05/04/24 2159     Discharge Medications:  acetaminophen  325 MG tablet Commonly known as: TYLENOL  Take 2 tablets (650 mg total) by mouth every 6 (six) hours as needed for mild pain (pain score 1-3) (or Fever >/= 101).    apixaban  2.5 MG Tabs tablet Commonly known as: Eliquis  Take 1 tablet (2.5 mg total) by mouth 2 (two) times daily.    Boost Glucose Control Liqd Take 1 Bottle by mouth 2 (two) times daily at 10 am and 4 pm.    glycopyrrolate  1 MG tablet Commonly known as: ROBINUL  Take 1 tablet (1 mg total) by mouth every 4 (four) hours as needed (excessive secretions).    isosorbide  mononitrate 60 MG 24 hr tablet Commonly known as: IMDUR  Take 1.5 tablets (90 mg total) by mouth daily.    loperamide  2 MG capsule Commonly known as: IMODIUM  Take 1 capsule (2 mg total) by mouth every 6 (six) hours as needed for diarrhea or loose stools.    LORazepam  0.5 MG tablet Commonly known as: ATIVAN  Take 1 tablet (0.5 mg total) by mouth every 4 (four) hours as needed for anxiety.    ondansetron  4 MG tablet Commonly known as: ZOFRAN  Take 4 mg by mouth  every 6 (six) hours as needed for nausea or vomiting.    oxyCODONE  20 MG/ML concentrated solution Commonly known as: ROXICODONE  INTENSOL Take 0.3 mLs (6 mg total) by mouth every 2 (two) hours as needed for moderate pain (pain score 4-6) (or dyspnea).    Relevant Imaging Results:  Relevant Lab Results:   Additional Information SSN: [id number]  Heather LABOR Jamine Highfill, LCSW

## 2024-05-05 NOTE — TOC Progression Note (Addendum)
 Transition of Care [facility]) - Progression Note    Patient Details  Name: Jake Gross MRN: [id number] Date of Birth: 02-14-1941  [facility]) CM/SW Contact  Heather DELENA Saltness, LCSW Phone Number: 05/05/2024, 9:59 AM  Clinical Narrative:    CSW spoke with admissions staff at [facility] who report pt cannot return to facility as LTC resident until family makes payment and asset check comes back. ETA for asset check is hopefully tomorrow 7/22. Daughter is aware of need to make payment. Pt to return to [facility] with hospice services through [facility]. Referral sent to hospital liaison, Melissa, with [facility]. FL2 with discharge medications completed. TOC will continue to follow.   Expected Discharge Plan: Long Term Nursing Home Barriers to Discharge: Continued Medical Work up  Expected Discharge Plan and Services   Discharge Planning Services: CM Consult Post Acute Care Choice: Skilled Nursing Facility Living arrangements for the past 2 months: Skilled Nursing Facility Expected Discharge Date: 05/05/24                                   Social Determinants of Health (SDOH) Interventions SDOH Screenings   Food Insecurity: No Food Insecurity (04/05/2024)  Housing: Low Risk  (04/05/2024)  Transportation Needs: No Transportation Needs (04/05/2024)  Utilities: Not At Risk (04/28/2024)  Alcohol  Screen: Low Risk  (12/11/2023)  Depression (PHQ2-9): Low Risk  (02/21/2024)  Financial Resource Strain: Low Risk  (12/11/2023)  Physical Activity: Insufficiently Active (12/11/2023)  Social Connections: Moderately Isolated (04/05/2024)  Stress: No Stress Concern Present (12/11/2023)  Tobacco Use: Medium Risk (04/28/2024)  Health Literacy: Adequate Health Literacy (12/11/2023)    Readmission Risk Interventions    04/29/2024   12:53 PM 02/05/2023   11:08 AM  Readmission Risk Prevention Plan  Transportation Screening Complete Complete  Medication Review (RN Care Manager)   Complete  PCP or Specialist appointment within 3-5 days of discharge Complete Complete  HRI or Home Care Consult Complete Complete  SW Recovery Care/Counseling Consult Complete Complete  Palliative Care Screening Complete Not Applicable  Skilled Nursing Facility Complete Complete    Heather Saltness, MSW, LCSW 05/05/2024 10:07 AM

## 2024-05-05 NOTE — Progress Notes (Addendum)
 [facility] Liaison Note  Received request from [facility] for hospice services at [facility] after discharge. Spoke with patient's daughter to initiate education related to hospice philosophy, services and team approach to care. Daughter verbalized understanding of information given. Per discussion, the plan is for discharge home today or tomorrow.   DME needs discussed. Patient has the following equipment in the home: walker, cane. Family requests the following equipment for delivery: hospital bed, over bed table.  Please send signed and completed DNR home with patient/family. Please provide prescriptions at discharge as needed to ensure ongoing symptom management.  [facility] information and contact numbers given to patients daughter [name]. Please call with any concerns.  Thank you for the opportunity to participate in this patient's care.   Eleanor Nail, LPN [facility] Liaison [phone]

## 2024-05-06 DIAGNOSIS — Z515 Encounter for palliative care: Secondary | ICD-10-CM | POA: Diagnosis not present

## 2024-05-06 DIAGNOSIS — N179 Acute kidney failure, unspecified: Secondary | ICD-10-CM | POA: Diagnosis not present

## 2024-05-06 DIAGNOSIS — Z79899 Other long term (current) drug therapy: Secondary | ICD-10-CM | POA: Diagnosis not present

## 2024-05-06 DIAGNOSIS — L8990 Pressure ulcer of unspecified site, unspecified stage: Secondary | ICD-10-CM | POA: Insufficient documentation

## 2024-05-06 DIAGNOSIS — Z7189 Other specified counseling: Secondary | ICD-10-CM | POA: Diagnosis not present

## 2024-05-06 DIAGNOSIS — C786 Secondary malignant neoplasm of retroperitoneum and peritoneum: Secondary | ICD-10-CM | POA: Diagnosis not present

## 2024-05-06 LAB — GLUCOSE, CAPILLARY: Glucose-Capillary: 189 mg/dL — ABNORMAL HIGH (ref 70–99)

## 2024-05-06 MED ORDER — ZINC OXIDE 40 % EX OINT
TOPICAL_OINTMENT | Freq: Three times a day (TID) | CUTANEOUS | 0 refills | Status: DC
Start: 2024-05-06 — End: 2024-05-09

## 2024-05-06 MED ORDER — ZINC OXIDE 40 % EX OINT
TOPICAL_OINTMENT | Freq: Three times a day (TID) | CUTANEOUS | Status: DC
  Filled 2024-05-06: qty 57

## 2024-05-07 ENCOUNTER — Other Ambulatory Visit: Payer: Self-pay | Admitting: *Deleted

## 2024-05-07 NOTE — Patient Outreach (Addendum)
 Collaboration with Abigail, [facility] social worker.  Jake Gross transitioned to [facility] from acute hospital on 04/20/2024 under [facility] hospice services. [name] social worker reports Jake Gross passed away last night at [facility].  Will update [facility] CCM team and PCP office.   [name], MSN, RN, BSN [place]  [facility] RN Post- Acute Care Manager Direct Dial: [phone]

## 2024-05-16 NOTE — Progress Notes (Signed)
 Daily Progress Note   Patient Name: Jake Gross       Date: 05/11/2024 DOB: 09-26-1941  Age: 83 y.o. MRN#: [id number] Attending Physician: Rizwan, Saima, MD Primary Care Physician: Wendee Lynwood HERO, NP Admit Date: 04/28/2024 Length of Stay: 8 days  Reason for Consultation/Follow-up: Establishing goals of care  Subjective:   CC: Patient awaiting discharge to long-term care with hospice.  Following up regarding complex medical decision making.  Subjective:  Reviewed EMR prior to presenting to bedside.     I presented to bedside to see patient.  No visitors present.  Patient laying comfortably in bed.  Again introduced myself as a member of the palliative medicine team.  Inquired about symptom management.  Patient denied any pain or nausea at this time.    All questions answered at that time.  Patient waiting to go to long-term care with hospice support.  Objective:   Vital Signs:  BP 124/73 (BP Location: Left Arm)   Pulse 99   Temp 98.1 F (36.7 C) (Oral)   Resp 20   Ht 5' 7 (1.702 m)   Wt 80.4 kg   SpO2 94%   BMI 27.76 kg/m   Physical Exam: General: NAD, alert, chronically ill-appearing Cardiovascular: RRR Respiratory: no increased work of breathing noted, not in respiratory distress Abdomen: distended Neuro: Awake, interactive, following commands easily Psych: appropriately answers all questions  Assessment & Plan:   Assessment: Patient is a 82 year old male with past medical history of hypertension, diabetes mellitus, CAD status post CABG, HFpEF, paroxysmal atrial fibrillation, and CKD stage IV who was admitted on 04/28/2024 for lethargy.  Upon admission patient found to have large pleural effusion and ascites along with elevated creatinine.  Patient receiving management for metabolic acidosis, hyperkalemia, large right-sided pleural effusion, and AKI on CKD.  Nephrology consulted for recommendations.  Imaging obtained noted concerns for metastatic cancer and lymph  nodes and peritoneal carcinomatosis.  Palliative medicine team consulted to assist with complex medical decision making.   Recommendations/Plan: # Complex medical decision making/goals of care:     - Discussed care with patient as detailed above in HPI.  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  Patient did not want to pursue aggressive medical interventions.  Plan is for patient to return to [facility] with hospice support possibly tomorrow.  Palliative medicine team available if needed.                - ACP documentation completed by chaplain and notarized.  Patient named her daughter, [name], as HCPOA and son, Alyce, as primary alternate.                  Code Status: Do not attempt resuscitation (DNR) - Comfort care   # Symptom management Patient is receiving these palliative interventions for symptom management with an intent to improve quality of life.                   -Pain/Dyspnea, acute in the setting of end-of-life care                               - Continue IV Dilaudid  1 mg every 2 hours as needed breakthrough pain or shortness of breath                               -  Continue as needed oxycodone  5 mg every 2 hours as needed p.o./sublingual                  -Anxiety/agitation, in the setting of end-of-life care                               - Continue Ativan  p.o./sublingual.5 mg every 4 hours as needed. Continue to adjust based on patient's symptom burden.                                 - Continue Haldol  0.5 mg every 4 hours as needed. Continue to adjust based on patient's symptom burden.                   -Secretions, in the setting of end-of-life care                               -Continue glycopyrrolate  0.2 mg every 4 hours as needed.                  - Diarrhea                               - Continue Imodium  2 mg every 6 hours as needed.  Monitor for  constipation in setting of use of this.   # Psycho-social/Spiritual Support:  - Support System: Daughter, son - Desire for further Chaplain support:yes  # Discharge Planning: Skilled Nursing Facility with Hospice Anticipate discharge today.  Thank you for allowing the palliative care team to participate in the care Willy S Man.  Low MDM Lonia Serve MD.  Palliative Care Provider PMT # [phone]  If patient remains symptomatic despite maximum doses, please call PMT at [phone] between 0700 and 1900. Outside of these hours, please call attending, as PMT does not have night coverage.

## 2024-05-16 NOTE — TOC Transition Note (Addendum)
 Transition of Care [facility]) - Discharge Note   Patient Details  Name: Jake Gross MRN: [id number] Date of Birth: August 07, 1941  Transition of Care [facility]) CM/SW Contact:  Bascom Service, RN Phone Number: 04/30/2024, 9:24 AM   Clinical Narrative: d/c [name] w/hospice rep [facility] accepted.. DNR. Awaiting updated d/c summary prior rm#,report#. PTAR.   -9:34a Going to [address]  rm#[id number], report#[phone]. PTAR called-forms in printer.. No further CM needs.    Final next level of care: Long Term Nursing Home Barriers to Discharge: No Barriers Identified   Patient Goals and CMS Choice Patient states their goals for this hospitalization and ongoing recovery are:: LTC CMS Medicare.gov Compare Post Acute Care list provided to:: Patient Represenative (must comment) ([name](dtr)) Choice offered to / list presented to : Adult Children  ownership interest in [facility].provided to:: Adult Children    Discharge Placement              Patient chooses bed at: [facility] Patient to be transferred to facility by: PTAR Name of family member notified: [name] Patient and family notified of of transfer: 05/03/2024  Discharge Plan and Services Additional resources added to the After Visit Summary for     Discharge Planning Services: CM Consult Post Acute Care Choice: Nursing Home                               Social Drivers of Health (SDOH) Interventions SDOH Screenings   Food Insecurity: No Food Insecurity (04/05/2024)  Housing: Low Risk  (04/05/2024)  Transportation Needs: No Transportation Needs (04/05/2024)  Utilities: Not At Risk (04/28/2024)  Alcohol  Screen: Low Risk  (12/11/2023)  Depression (PHQ2-9): Low Risk  (02/21/2024)  Financial Resource Strain: Low Risk  (12/11/2023)  Physical Activity: Insufficiently Active (12/11/2023)  Social Connections: Moderately Isolated (04/05/2024)  Stress: No Stress Concern Present (12/11/2023)  Tobacco Use: Medium Risk  (04/28/2024)  Health Literacy: Adequate Health Literacy (12/11/2023)     Readmission Risk Interventions    04/29/2024   12:53 PM 02/05/2023   11:08 AM  Readmission Risk Prevention Plan  Transportation Screening Complete Complete  Medication Review (RN Care Manager)  Complete  PCP or Specialist appointment within 3-5 days of discharge Complete Complete  HRI or Home Care Consult Complete Complete  SW Recovery Care/Counseling Consult Complete Complete  Palliative Care Screening Complete Not Applicable  Skilled Nursing Facility Complete Complete

## 2024-05-16 NOTE — Progress Notes (Signed)
 [facility] Progress Note  Patient: Jake Gross     FMW:[id number]  DOA: 04/28/2024   PCP: Wendee Lynwood HERO, NP       Brief hospital course: This is a 83 year old male with hypertension, coronary artery disease status post CABG, chronic HFpEF, paroxysmal atrial fibrillation, chronic kidney disease stage IV who presented to the hospital for lethargy.  He was found to have a large pleural effusion and ascites and enlarged abdominal lymph nodes.  The patient was also noted to have a creatinine of 4.96 which was elevated from his baseline of 2.95 and a potassium of 6.1.  He was treated with IV fluids and Lokelma .  Brief hospital course: This is a 83 year old male with hypertension, coronary artery disease status post CABG, chronic HFpEF, paroxysmal atrial fibrillation, chronic kidney disease stage IV who presented to the hospital for lethargy.  He was found to have a large pleural effusion and ascites and enlarged abdominal lymph nodes.  The patient was also noted to have a creatinine of 4.96 which was elevated from his baseline of 2.95 and a potassium of 6.1.  He was treated with IV fluids and Lokelma .   Subjective:  No complaints today.    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 in 2016 - According to my discussion with the patient's daughter, the patient opted not to continue surveillance colonoscopies as outpatient and at this time, would not want chemotherapy - transitioned to comfort care on 05/01/20- appreciate palliative care assistance   Active Problems: Hyperkalemia - Improved from 6.1 to 4.7   Paroxysmal atrial fibrillation (HCC) History of CVA - Can continue Eliquis  unless he refuses to take it           Code Status: Do not attempt resuscitation (DNR) - Comfort care Total time on patient care: 30 minutes DVT prophylaxis: Eliquis   Objective:   Vitals:   05/04/24 1931 05/05/24 0551 05/05/24 1323 05/07/2024 0508  BP: 107/62 109/69 103/64 124/73  Pulse: 99 (!) 103 95 99  Resp: 20 20 20 20   Temp: 98.3 F (36.8 C)  97.9 F (36.6 C) 98.1 F (36.7 C)  TempSrc: Oral  Oral Oral  SpO2: 97% 94% 100% 94%  Weight:      Height:       Filed Weights   04/29/24 0442 04/30/24 0511 05/01/24 0442  Weight: 78.8 kg 80.4 kg 80.4 kg   Exam: General exam: Appears comfortable-awake alert and oriented x 3 HEENT: oral mucosa moist Respiratory system: Clear to auscultation.  Cardiovascular system: S1 & S2 heard  Gastrointestinal system: Abdomen soft, non-tender, nondistended. Normal bowel sounds   Extremities: No cyanosis, clubbing or edema Psychiatry:  Mood & affect appropriate.    CBC: Recent Labs  Lab 04/30/24 0502 05/01/24 0530  WBC 12.7* 13.0*  HGB 10.6* 11.5*  HCT 33.4* 36.7*  MCV 81.7 82.8  PLT 94* 115*   Basic Metabolic Panel: Recent Labs  Lab 04/29/24 2040 04/30/24 0016 04/30/24 0502 04/30/24 1208 05/01/24 0530  NA  --   --  134*  --  135  K 5.6* 5.2* 4.8 5.5* 4.7  CL  --   --  99  --  101  CO2  --   --  19*  --  17*  GLUCOSE  --   --  116*  --  154*  BUN  --   --  100*  --  112*  CREATININE  --   --  4.98*  --  4.84*  CALCIUM   --   --  7.4*  --  7.3*     Scheduled Meds:  apixaban   2.5 mg Oral BID   Chlorhexidine  Gluconate Cloth  6 each Topical Daily   Gerhardt's butt cream   Topical TID   liver oil-zinc  oxide   Topical TID   sodium chloride  flush  3 mL Intravenous Q12H    Imaging and lab data personally reviewed   Author: Toniette Devera  04/26/2024 9:31 AM  To contact [facility]>   Check the care team in [facility] and look for the attending/consulting [facility] provider listed  Log into [email or web address] and use [place]'s universal password   Go to>  [facility]  and find provider  If you still have difficulty reaching the provider, please page the [facility] (Director on Call) for the Hospitalists listed on amion

## 2024-05-16 DEATH — deceased

## 2024-06-03 ENCOUNTER — Ambulatory Visit: Admitting: Podiatry

## 2024-12-11 ENCOUNTER — Ambulatory Visit: Payer: Medicare Other
